# Patient Record
Sex: Male | Born: 1960 | Hispanic: No | Smoking: Never smoker
Health system: Southern US, Community
[De-identification: ages and names within clinical notes are randomized; demographics above are authoritative.]

## PROBLEM LIST (undated history)

## (undated) DIAGNOSIS — F99 Mental disorder, not otherwise specified: Secondary | ICD-10-CM

## (undated) DIAGNOSIS — F319 Bipolar disorder, unspecified: Secondary | ICD-10-CM

## (undated) DIAGNOSIS — E871 Hypo-osmolality and hyponatremia: Secondary | ICD-10-CM

## (undated) DIAGNOSIS — I639 Cerebral infarction, unspecified: Secondary | ICD-10-CM

## (undated) DIAGNOSIS — I10 Essential (primary) hypertension: Secondary | ICD-10-CM

## (undated) DIAGNOSIS — G4730 Sleep apnea, unspecified: Secondary | ICD-10-CM

## (undated) DIAGNOSIS — J302 Other seasonal allergic rhinitis: Secondary | ICD-10-CM

## (undated) HISTORY — PX: EYE SURGERY: SHX253

## (undated) HISTORY — PX: CIRCUMCISION: SUR203

---

## 2002-08-26 ENCOUNTER — Emergency Department (HOSPITAL_COMMUNITY): Admission: EM | Admit: 2002-08-26 | Discharge: 2002-08-26 | Payer: Self-pay

## 2004-05-06 ENCOUNTER — Inpatient Hospital Stay (HOSPITAL_COMMUNITY): Admission: AD | Admit: 2004-05-06 | Discharge: 2004-05-14 | Payer: Self-pay | Admitting: Psychiatry

## 2004-05-06 ENCOUNTER — Ambulatory Visit: Payer: Self-pay | Admitting: Psychiatry

## 2004-09-11 ENCOUNTER — Inpatient Hospital Stay (HOSPITAL_COMMUNITY): Admission: AD | Admit: 2004-09-11 | Discharge: 2004-09-16 | Payer: Self-pay | Admitting: Psychiatry

## 2004-09-11 ENCOUNTER — Ambulatory Visit: Payer: Self-pay | Admitting: Psychiatry

## 2005-01-10 ENCOUNTER — Emergency Department (HOSPITAL_COMMUNITY): Admission: EM | Admit: 2005-01-10 | Discharge: 2005-01-10 | Payer: Self-pay | Admitting: Emergency Medicine

## 2005-04-08 ENCOUNTER — Ambulatory Visit: Payer: Self-pay | Admitting: Family Medicine

## 2005-05-11 ENCOUNTER — Ambulatory Visit: Payer: Self-pay | Admitting: Family Medicine

## 2005-10-03 ENCOUNTER — Emergency Department (HOSPITAL_COMMUNITY): Admission: EM | Admit: 2005-10-03 | Discharge: 2005-10-03 | Payer: Self-pay | Admitting: *Deleted

## 2006-02-23 ENCOUNTER — Inpatient Hospital Stay (HOSPITAL_COMMUNITY): Admission: RE | Admit: 2006-02-23 | Discharge: 2006-02-27 | Payer: Self-pay | Admitting: Psychiatry

## 2006-02-23 ENCOUNTER — Ambulatory Visit: Payer: Self-pay | Admitting: Psychiatry

## 2006-06-11 ENCOUNTER — Ambulatory Visit: Payer: Self-pay | Admitting: Psychiatry

## 2006-06-11 ENCOUNTER — Inpatient Hospital Stay (HOSPITAL_COMMUNITY): Admission: AD | Admit: 2006-06-11 | Discharge: 2006-06-15 | Payer: Self-pay | Admitting: Psychiatry

## 2006-07-15 ENCOUNTER — Inpatient Hospital Stay (HOSPITAL_COMMUNITY): Admission: EM | Admit: 2006-07-15 | Discharge: 2006-07-19 | Payer: Self-pay | Admitting: *Deleted

## 2006-07-15 ENCOUNTER — Ambulatory Visit: Payer: Self-pay | Admitting: *Deleted

## 2007-11-16 ENCOUNTER — Emergency Department (HOSPITAL_COMMUNITY): Admission: EM | Admit: 2007-11-16 | Discharge: 2007-11-16 | Payer: Self-pay | Admitting: Emergency Medicine

## 2007-11-17 ENCOUNTER — Emergency Department (HOSPITAL_COMMUNITY): Admission: EM | Admit: 2007-11-17 | Discharge: 2007-11-17 | Payer: Self-pay | Admitting: Emergency Medicine

## 2009-04-02 ENCOUNTER — Emergency Department (HOSPITAL_COMMUNITY): Admission: EM | Admit: 2009-04-02 | Discharge: 2009-04-02 | Payer: Self-pay | Admitting: Emergency Medicine

## 2010-04-29 ENCOUNTER — Other Ambulatory Visit (HOSPITAL_COMMUNITY): Payer: Self-pay | Admitting: Emergency Medicine

## 2010-04-29 ENCOUNTER — Ambulatory Visit (HOSPITAL_COMMUNITY)
Admission: RE | Admit: 2010-04-29 | Discharge: 2010-04-29 | Disposition: A | Discharge status: Home / Self Care | Payer: Medicare Other | Source: Ambulatory Visit | Attending: Emergency Medicine | Admitting: Emergency Medicine

## 2010-04-29 ENCOUNTER — Observation Stay (HOSPITAL_COMMUNITY)
Admission: EM | Admit: 2010-04-29 | Discharge: 2010-04-30 | Disposition: A | Discharge status: Home / Self Care | Payer: Medicare Other | Attending: Internal Medicine | Admitting: Internal Medicine

## 2010-04-29 DIAGNOSIS — R55 Syncope and collapse: Secondary | ICD-10-CM

## 2010-04-29 DIAGNOSIS — I10 Essential (primary) hypertension: Secondary | ICD-10-CM | POA: Insufficient documentation

## 2010-04-29 DIAGNOSIS — F319 Bipolar disorder, unspecified: Secondary | ICD-10-CM | POA: Insufficient documentation

## 2010-04-29 DIAGNOSIS — R079 Chest pain, unspecified: Secondary | ICD-10-CM | POA: Insufficient documentation

## 2010-04-29 DIAGNOSIS — F259 Schizoaffective disorder, unspecified: Secondary | ICD-10-CM | POA: Insufficient documentation

## 2010-04-29 DIAGNOSIS — E119 Type 2 diabetes mellitus without complications: Secondary | ICD-10-CM | POA: Insufficient documentation

## 2010-04-29 LAB — CARDIAC PANEL(CRET KIN+CKTOT+MB+TROPI)
CK, MB: 3.7 ng/mL (ref 0.3–4.0)
Relative Index: 1.1 (ref 0.0–2.5)
Total CK: 295 U/L — ABNORMAL HIGH (ref 7–232)
Troponin I: <0.01 ng/mL (ref 0.00–0.06)
Troponin I: <0.01 ng/mL (ref 0.00–0.06)

## 2010-04-29 LAB — PHOSPHORUS: Phosphorus: 3.4 mg/dL (ref 2.3–4.6)

## 2010-04-29 LAB — RAPID URINE DRUG SCREEN, HOSP PERFORMED
Amphetamines: NOT DETECTED
Benzodiazepines: NOT DETECTED
Opiates: NOT DETECTED
Tetrahydrocannabinol: NOT DETECTED

## 2010-04-29 LAB — GLUCOSE, CAPILLARY: Glucose-Capillary: 134 mg/dL — ABNORMAL HIGH (ref 70–99)

## 2010-04-29 LAB — URINALYSIS, ROUTINE W REFLEX MICROSCOPIC
Bilirubin Urine: NEGATIVE
Hgb urine dipstick: NEGATIVE
Urine Glucose, Fasting: NEGATIVE mg/dL
Urobilinogen, UA: 0.2 mg/dL (ref 0.0–1.0)

## 2010-04-29 LAB — CBC
Hemoglobin: 14.4 g/dL (ref 13.0–17.0)
MCV: 86.1 fL (ref 78.0–100.0)
Platelets: 239 10*3/uL (ref 150–400)
RBC: 4.61 MIL/uL (ref 4.22–5.81)
RDW: 13 % (ref 11.5–15.5)

## 2010-04-29 LAB — BASIC METABOLIC PANEL
CO2: 25 mEq/L (ref 19–32)
Chloride: 100 mEq/L (ref 96–112)
Potassium: 4.9 mEq/L (ref 3.5–5.1)

## 2010-04-29 LAB — ETHANOL: Alcohol, Ethyl (B): <5 mg/dL (ref 0–10)

## 2010-04-29 LAB — CREATININE, URINE, RANDOM: Creatinine, Urine: 31.1 mg/dL

## 2010-04-30 DIAGNOSIS — R55 Syncope and collapse: Secondary | ICD-10-CM

## 2010-04-30 LAB — GLUCOSE, CAPILLARY: Glucose-Capillary: 108 mg/dL — ABNORMAL HIGH (ref 70–99)

## 2010-04-30 LAB — BASIC METABOLIC PANEL: Sodium: 136 mEq/L (ref 135–145)

## 2010-05-02 NOTE — Discharge Summary (Signed)
NAME:  Mitchell [name], Mitchell [name]                ACCOUNT NO.:  [id number]  MEDICAL RECORD NO.:  [id number]           PATIENT TYPE:  I  LOCATION:  3735                         FACILITY:  [facility]  PHYSICIAN:  Charlestine Massed, MDDATE OF BIRTH:  [birth year]  DATE OF ADMISSION:  04/29/2010 DATE OF DISCHARGE:  04/30/2010                        DISCHARGE SUMMARY - REFERRING   PRIMARY CARE PHYSICIAN:  The patient is expected to seen by the MD at the [facility].  CARDIOLOGY:  Rollene Rotunda, MD, [facility]  CHIEF COMPLAINT:  Passing out twice in the store.  DISCHARGE DIAGNOSES: 1. Syncope x2.  No evidence of any arrhythmias - for outpatient     followup with Cardiology. 2. No evidence of ischemic heart disease and no evidence of acute     coronary syndrome. 3. Orthostatic hypotension as a possible reason for his issues. 4. History of diabetes mellitus but blood sugars were pretty stable     during hospital stay without any medications - needs outpatient     followup for that. 5. History of hypertension, blood pressure is pretty stable without     any medications.  He will need further outpatient followup for     starting medications. 6. History of bipolar disorder/schizoaffective disorder.  DISCHARGE MEDICATIONS:  Aspirin 81 mg p.o. daily.  Continue home medications as follows: 1. Celexa 20 mg p.o. daily. 2. Cogentin 1 mg tablets 1-2 tablets p.o. b.i.d. 3. Prozac 20 mg p.o. daily.  HOSPITAL COURSE:  The patient was admitted because he syncopized  twice at 1:00 a.m. in the morning.  The patient has been otherwise okay.  He walked 15 minutes to the store before he syncopized.  He was admitted with a diagnosis of syncope and was placed on telemetry, underwent more than 36 hours of telemetry monitoring and so was not able to detect any arrhythmias.  It was noted that at the time of admission, he did have a low blood pressure on standing as measured by the EMS, which was not reproduced any time in  the ER here or on the floor.  Orthostatics remaining stable.  1. This patient has been seen by Cardiology, and they will follow up     in the outpatient setting.  He had an echocardiogram, which did not     reveal any stenotic lesions and he did not reveal any heart failure     or wall motion abnormalities.  His carotid and vertebral artery     Dopplers were also negative.  Currently, I have discharged with     single dose of aspirin. 2. History of diabetes.  The patient said that even though some people     told that he has diabetes, he never had any medications on and he     has been doing well all this time.  During his hospital stay, his     blood sugars have been constantly checked and they all remained     within normal and acceptable limits.  At this time, I am not     starting him on any medications since he has an excessive amount of  stress, so it can be started as an outpatient after checking his     hemoglobin A1c levels as well as 2 fasting blood sugar levels to     ascertain whether he has diabetes and then medication can be added. 3. Hypertension.  As mentioned above, he does have a history of     hypertension as he says but the blood pressures have stayed pretty     stable during the hospital stay despite the stress of being in the     hospital.  I am not initiating him on any antihypertensives at this     time and Cardiology has also seen and they did not support that     view too.  If necessary, medications have been started and as     outpatient when he is more stable and still if his blood pressure     remained more than 130.  At this time, he is discharged not on any     antihypertensive medication. 4. Bipolar disorder/schizoaffective disorder.  The patient follows     over the [facility].  He will follow up there     for his psychiatric issues.  DISPOSITION:  Discharged back home.  FOLLOWUP: 1. Follow up with the [facility] in 1 week.   Appointment has been     made already. 2. Follow up with Dr. Antoine Poche for outpatient Cardiology followup.  As     stated above, Cardiology will call for appointment.  SUBJECTIVE:  The patient is seen and examined today, not in any distress, afebrile.  OBJECTIVE:  VITAL SIGNS:  Temperature 98.4, heart rate 78, respirations 22, blood pressure 128/90, O2 saturation 99%. GENERAL:  The patient awake and alert. HEAD AND NECK:  No JVD, no bruits, no nodes. CHEST:  Bilateral air entry is good anteriorly and posteriorly.  No rales or wheeze heard. CARDIAC:  S1 and S2, regular.  No murmurs. ABDOMEN:  Soft, nontender, no organomegaly. EXTREMITIES:  No pedal edema.  LABORATORY DATA:  BMP:  Sodium 136, potassium 4.2, chloride 103, bicarb 25, glucose 100, BUN 6, creatinine 1.19, calcium 8.8.  An echocardiogram shows EF of 50-55%.  No wall motion abnormalities.  No evidence of mitral stenosis or regurgitation.  No evidence of aortic stenosis or aortic regurgitation.  Carotid Dopplers:  No internal carotid artery or external carotid artery stenosis and vertebral artery flow is antegrade.  ASSESSMENT/PLAN:  As dictated above.  A total of 40 minutes spent on the discharge process.     Charlestine Massed, MD    UT/MEDQ  D:  04/30/2010  T:  04/30/2010  Job:  [id number]  cc:   Rollene Rotunda, MD, [facility]  Electronically Signed by Charlestine Massed MD on 05/02/2010 11:21:23 AM

## 2010-05-02 NOTE — H&P (Signed)
NAME:  Mitchell [name], Mitchell [name]                ACCOUNT NO.:  [id number]  MEDICAL RECORD NO.:  [id number]           PATIENT TYPE:  E  LOCATION:  [facility]                         FACILITY:  [facility]  PHYSICIAN:  Charlestine Massed, MDDATE OF BIRTH:  1960/07/21  DATE OF ADMISSION:  04/29/2010 DATE OF DISCHARGE:                             HISTORY & PHYSICAL   PRIMARY CARE PHYSICIAN:  Unassigned.  The patient does not have a primary care physician.  Psychiatry follows up at [facility].  CHIEF COMPLAINT:  Passing out in the store twice.  HISTORY OF PRESENT ILLNESS:  [name] is a 50 year old gentleman with prior history of diabetes and hypertension for which he is not taking any medications, goes to the local store which is almost a 15- minute walk from his house in the morning at 1 o'clock.  He said he walked from his home to the store to take some money out of the ATM at 1:00 a.m. in the morning and when he reached the ATM, he felt lightheaded in the sense that is of darkness coming into his eyes.  He also said that he did feel palpitations in his chest and then he passed out.  He did not know what happened then and he woke up he was lying on the floor with people were looking over him and they helped him get up and they gave his card back.  When he got up, he again felt dizzy and passed out again.  At this time, the people called EMS, EMS picked him up.  As per the EMS note, his systolic blood pressure was 68 standing while 108 sitting by the EMS but in the ED.  He was brought to the ED. In the ED, he was checked and they had to redo his orthostatic vitals, which were normal here.  As per the witnesses, there was no evidence of any seizure activity as per EMS notes.  He did not have any defecation on urination on his clothes there.  He is still wearing the same cloth and is not wet.  No history of any injury to the head.  The patient denies any chest pain.  No nausea,  vomiting and diarrhea. No fever.  No chills.  No cough.  No sputum production.  He did say that when he felt dizzy, he also felt that his legs are too weak and giving away.  PAST HISTORY: 1. Schizoaffective disorder. 2. Hypertension. 3. Diabetes mellitus. 4. Poor compliance with doctor appointments.  CURRENT MEDICATIONS:  As per the patient; he is taking Prozac, Celexa, and Cogentin, not aware of any other medications.  He did say that he is not taking any medications for diabetes or hypertension and he says is controlled.  SOCIAL HISTORY:  No smoking, no alcohol, no drugs.  He lives by himself.  FAMILY HISTORY:  The patient could not recollect much of his family history.  REVIEW OF SYSTEMS:  Twelve-point system review unremarkable.  Positive pertinent, please see above in history of present illness, negative otherwise.  PHYSICAL EXAMINATION:  VITAL SIGNS:  In the ED, blood pressure 115/92, heart  rate 79, respiration 18, temperature 98.1. To note that the EMS vital signs are Systolic blood pressure 108 sitting and 68 on standing. GENERAL:  The patient is awake, alert, speaks clearly but he has a slightly rapid speech, but content of speech is clear.  Afebrile.  Not in any distress. HEAD AND NECK:  Pupils 3 mm reactive to light.  No JVD.  No bruits.  No nodes.  No bleeding in oral or  nasal mucosa.  Tongue is moist. CHEST:  Bilateral air entry is good anteriorly and posteriorly.  No rales or wheeze heard. CARDIAC:  S1 and S2 heard, regular, no murmurs. ABDOMEN:  Soft, nontender.  No organomegaly.  Bowel sounds positive. EXTREMITIES:  No pedal edema. CNS:  No obvious motor or sensory deficits.  Speech and comprehension intact. PSYCHIATRIC:  Slightly rapid speech, but not agitated, is in full control of his mental status.  He does not have any day hallucinations or delusions and speaks clearly and consider speech is clear.  CT scan of the head was reported by the radiologist  to the ED physician as negative for any acute intracranial issues.  EKG normal sinus rhythm at 78 beats a minute.  QTC is approximately for 430 milliseconds.  There is small R wave and deep S waves in V1 and V2 could be related to the axis itself.  There are no ST or T-wave changes of ischemia present at this time.  Lab works are still not available since the computer system supporting lab works are not up.  The only labs available are troponin-I 0.00, total CPK 420, CK-MB negative.  CBC:  WBC 3.26, hemoglobin 14.5, hematocrit 39.7, and platelets 239, MCV 86.1.  UA:  Negative for blood, negative for urinary tract infection.  BMP:  Sodium 134, potassium 4.1, chloride 96, bicarb 26, glucose 130, BUN 2, creatinine is 1.28, and calcium 9.6.  Blood alcohol level less than 5.0.  ASSESSMENT AND PLAN: 1. Recurrent syncope.  The causes could be:     a.     Orthostatic hypotension.     b.     Cardiac arrhythmias since the patient did have palpitations.     c.     Rule out vasovagal issues. 2. There is history of diabetes mellitus. 3. History of hypertension. 4. Schizoaffective disorder.  PLAN: 1. We will admit the patient to telemetry service under observation to     [facility] Team for [facility]. 2. Place him on telemetry and observe his cardiac rhythm correctly. 3. We will check magnesium and phosphorus on the a.m.  Blood sample     done already.  We will check troponins. 4. With regards to his creatinine being 1.28, I will follow.  I will     put him on some gentle IV fluids 100 mL/h, which could eliminate     any other dehydration.  We will check orthostatics on admit and     orthostatic in the followup and if the telemetry does not reveal     and he will need possibly outpatient workup for ruling out     arrhythmias.  I already informed [facility] to follow up on     that.  We will get echocardiogram and carotid Dopplers. 5. Deep venous thrombosis.  We will put him on  Lovenox.  We will     continue aspirin.  A total of 60 minutes spent on the admission process.     Charlestine Massed, MD  UT/MEDQ  D:  04/29/2010  T:  04/29/2010  Job:  [id number]  Electronically Signed by Charlestine Massed MD on 05/02/2010 11:21:13 AM

## 2010-05-12 DIAGNOSIS — I10 Essential (primary) hypertension: Secondary | ICD-10-CM | POA: Insufficient documentation

## 2010-05-12 DIAGNOSIS — E1165 Type 2 diabetes mellitus with hyperglycemia: Secondary | ICD-10-CM | POA: Insufficient documentation

## 2010-05-12 DIAGNOSIS — I951 Orthostatic hypotension: Secondary | ICD-10-CM | POA: Insufficient documentation

## 2010-05-12 DIAGNOSIS — R55 Syncope and collapse: Secondary | ICD-10-CM | POA: Insufficient documentation

## 2010-05-12 DIAGNOSIS — F259 Schizoaffective disorder, unspecified: Secondary | ICD-10-CM | POA: Insufficient documentation

## 2010-05-12 DIAGNOSIS — I959 Hypotension, unspecified: Secondary | ICD-10-CM | POA: Insufficient documentation

## 2010-05-12 DIAGNOSIS — F319 Bipolar disorder, unspecified: Secondary | ICD-10-CM | POA: Insufficient documentation

## 2010-05-13 ENCOUNTER — Telehealth (INDEPENDENT_AMBULATORY_CARE_PROVIDER_SITE_OTHER): Payer: Self-pay | Admitting: Radiology

## 2010-05-14 ENCOUNTER — Encounter (HOSPITAL_COMMUNITY): Payer: Medicare Other

## 2010-05-18 ENCOUNTER — Encounter: Payer: Medicare Other | Admitting: Physician Assistant

## 2010-05-20 NOTE — Progress Notes (Signed)
Summary: Nuclear Pre-Procedure  Phone Note Outgoing Call Call back at [facility] Phone [phone]   Call placed by: Stanton Kidney, EMT-P,  May 13, 2010 2:13 PM Call placed to: Patient Reason for Call: Confirm/change Appt Summary of Call: Unable to leave message with information on [employer] Information Sheet (see scanned document for details). No one at that number has this patients name. Stanton Kidney, EMT-P  May 13, 2010 2:13 PM      Nuclear Med Background Indications for Stress Test: Evaluation for Ischemia, Post Hospital  Indications Comments: 04/29/10 syncopex2 - arrhythmia  History: Echo  History Comments: 04/30/10 Echo-EF=50-55%  Symptoms: Syncope    Nuclear Pre-Procedure Cardiac Risk Factors: Hypertension

## 2010-06-11 NOTE — Consult Note (Signed)
NAME:  Mitchell [name], Mitchell [name] NO.:  [id number]  MEDICAL RECORD NO.:  [id number]           PATIENT TYPE:  LOCATION:                                 FACILITY:  PHYSICIAN:  Rollene Rotunda, MD, FACCDATE OF BIRTH:  Mar 17, 1961  DATE OF CONSULTATION: DATE OF DISCHARGE:                                CONSULTATION   This is a consult note for chief complaint of syncope x2 prior to admission.  PRIMARY CARE PHYSICIAN:  The patient does not have a PCP.  PRIMARY PSYCHIATRIST:  The patient follows at [facility].  HISTORY OF PRESENT ILLNESS:  Mitchell [name] is a 50 year old gentleman with past medical history of type 1 diabetes mellitus, hypertension, and schizo-affective disorder being followed at the [facility] who was brought by EMS to the [facility] ER last night at 1 a.m. for chief complaint of syncope x2.  The patient was standing at an ATM machine for 5-10 minutes and 5-10 minutes before the event he felt dizzy with feeling of palpitations and darkness in his vision prior to passing out.  He did not have any chest pain or shortness of breath before this event.  When he woke up, he was not confused and did not have any tongue biting or urinary or fecal incontinence.  He was told by the bystander that he had passed out for about 15 minutes and did not seize during this episode.  After this event, he tried to stand up and was trying to get money out of the ATM machine again and he had a similar episode that lasted about 10 minutes.  EMS was called from the site and he was brought to the emergency room.  On route to the emergency room, the EMS checked his orthostatic vitals and he was found to be orthostatic with systolic blood pressure falling from 108 to 168 from sitting to standing up position.  The patient denies any similar symptoms before yesterday. He says that he had been eating 1 full meal a day and had been keeping himself hydrated  throughout the day before this event.  He denies any diarrhea, vomiting, flu-like symptoms, or any other symptoms prior to the event.  He denies any chest pain, shortness of breath, change in medications, fever, headache, or any other complaints in the last 1-2 weeks prior to this onset of this symptoms.  He was diagnosed with hypertension 20 years ago, but he has never been on any anti- hypertensive medications, has never been followed up by primary care physician for his hypertension and diabetes.  He denies any focal weakness.  He has been hydrated aggressively in the emergency and he feels fine.  He denies any dizziness, nausea, lightheadedness, or any other complaints at this time.  PAST MEDICAL HISTORY: 1. Type 1 diabetes mellitus, currently on no antidiabetic medications. 2. Hypertension, currently not on any medications. 3. Bipolar schizo-affective disorder.  MEDICATIONS:  Home Medications: 1. Cogentin 1 mg tablet twice a day and 2 mg at bedtime. 2. Prozac 20 mg daily. 3. Celexa, dose not known.  ALLERGIES:  The patient does not  have any medication allergies known at this time.  SOCIAL HISTORY:  The patient denies any drug, alcohol, or tobacco abuse. He lives with his friend in [place].  He gets Haematologist, he has Medicaid and [employer].  He is retired, used to work as a Electrical engineer prior to that.  FAMILY HISTORY:  The patient had a brother who had heart failure and pacemaker placement; and as per the patient, he died of myocardial infarction.  There is no other cardiac family history that the patient knows about.  The patient denies any other medical problems in the family.  PHYSICAL EXAMINATION:  VITAL SIGNS:  Temperature 98.1, pulse 107 per minute, blood pressure 118/86, respiratory rate 18 per minute, oxygen saturation 99% on room air.  The patient is currently not orthostatic in the emergency room. GENERAL:  The patient is  lying in bed in no acute distress. HEENT:  Pupils equal, reactive to light.  Extraocular muscles intact. Mucosa moist and pink. NECK:  Supple.  No JVD. CVS:  Regular rate and rhythm.  No murmurs.  No abnormal heart sounds. RESPIRATIONS:  Clear to auscultation bilaterally.  Equal breath sounds bilaterally. ABDOMEN:  Soft, nontender, and nondistended.  Normal bowel sounds. EXTREMITIES:  No edema.  Normal pulsations in all four extremities.  No enlarged veins. NEUROLOGICAL:  The patient is alert and oriented x3.  Motor strength good in all 4 extremities.  Sensations normal in all 4 extremities. Reflexes 2+ bilaterally.  Gait normal.  The patient does not have any dizziness or lightheadedness on standing up from supine position at this time.  LABORATORY DATA:  Sodium 134, potassium 4.1, chloride 96, bicarb 26, glucose 130, BUN 2, creatinine 1.28, calcium 9.6.  Blood alcohol level less than 5.  White count 3.26, RBC 4.61, hemoglobin 14.4, hematocrit 39.7, MCV 86.1, platelet count 239.  Point-of-care cardiac marker shows a CK of 420, CK-MB of 5.2, and no troponin elevations.  EKG shows normal sinus rhythm with a rate of 78 per minute, no ST-T wave changes.  IMAGING:  His noncontrast CT head and chest x-ray are normal.  ASSESSMENT AND PLAN:  This is a 50 year old gentleman with no known cardiac history and longstanding type 1 diabetes mellitus and hypertension who comes into the ER with 2 episodes of syncope 5 minutes apart after prolonged standing at an ATM machine.  He had a prodrome of palpitations, feeling of warmth, and blurry vision prior to the episode. This is his first episode.  He did not have any prior episode before since.  His lab work, EKG, and chest x-ray did not reveal the cause of his syncope, but he was significantly orthostatic in the ambulance. Prior to arrival to the emergency room, he has been aggressively hydrated by now and he does not have any symptoms at this  time.  He probably was dehydrated from inadequate fluid intake prior to this episode given his significant orthostatic hypotension.  This does explain the cause of his syncope, although given his history of diabetes and hypertension, he needs a transthoracic echocardiogram and carotid Dopplers bilaterally to rule out any obstruction.  He will be monitored in telemetry unit overnight and he will be given IV fluid hydration during this period.  His orthostatic blood pressure would be checked when he arrives at the floor and tomorrow morning prior to discharge. He will need an exercise treadmill test as an outpatient to rule out any coronary obstruction.  If he continues to be orthostatic after  IV fluid hydration in the hospital, he will need supportive care as far as volume expanders like midodrine or fludrocortisone compression stockings.  He has also been advised to watch for further episodes of lightheadedness after prolonged standing and to stand up slowly from a seated position and to seek immediate help in case he experiences further episode of lightheadedness in the future. [facility] will be admitting the patient.  We will follow the patient along with you.  Thank you for this consultation.     Bethel Born, MD   ______________________________ Rollene Rotunda, MD, [facility]    MD/MEDQ  D:  04/29/2010  T:  04/30/2010  Job:  [id number]  Electronically Signed by Bethel Born  on 06/07/2010 07:06:29 PM Electronically Signed by Rollene Rotunda MD [facility] on 06/11/2010 07:57:12 PM

## 2010-06-14 LAB — BASIC METABOLIC PANEL
CO2: 30 mEq/L (ref 19–32)
Chloride: 94 mEq/L — ABNORMAL LOW (ref 96–112)
Creatinine, Ser: 0.85 mg/dL (ref 0.4–1.5)
GFR calc non Af Amer: >60 mL/min (ref >60)
Sodium: 131 mEq/L — ABNORMAL LOW (ref 135–145)

## 2010-06-14 LAB — CBC
MCHC: 35.2 g/dL (ref 30.0–36.0)
MCV: 92.6 fL (ref 78.0–100.0)
Platelets: 270 10*3/uL (ref 150–400)
WBC: 4.7 10*3/uL (ref 4.0–10.5)

## 2010-06-14 LAB — DIFFERENTIAL
Basophils Absolute: 0 10*3/uL (ref 0.0–0.1)
Monocytes Absolute: 0.4 10*3/uL (ref 0.1–1.0)
Neutrophils Relative %: 52 % (ref 43–77)

## 2010-08-14 NOTE — Discharge Summary (Signed)
NAME:  CARLE, FENECH NO.:  [id number]   MEDICAL RECORD NO.:  [id number]          PATIENT TYPE:  IPS   LOCATION:  0305                          FACILITY:  [facility]   PHYSICIAN:  Anselm Jungling, MD  DATE OF BIRTH:  April 25, 1960   DATE OF ADMISSION:  02/23/2006  DATE OF DISCHARGE:  02/27/2006                               DISCHARGE SUMMARY   IDENTIFYING DATA AND REASON FOR ADMISSION:  This was a psychiatric  inpatient admission for Mitchell [name], a 50 year old separated African American  male admitted due to increasing suicidal ideation.  He stated, I was on  the verge of hurting myself because I was depressed, lonely, hungry, and  very suicidal.  He had recently separated from his wife of only 10  months.  He was also having financial problems, though he was working at  a [employer].  Paperwork indicated that the patient had lost  his job due to various paranoid influence behaviors at work, and he  apparently also had a previous history of mania and paranoia.  He had  been on a regimen of Prolixin Decanoate and Cogentin.  He reported that  he had been adherent with his medication regimen, but this was in some  doubt.  Please refer to the admission note for further details  pertaining to the symptoms, circumstances and history that led to his  hospitalization.  He was given an initial Axis I diagnosis of  schizophrenia, chronic paranoid, with acute exacerbation.   MEDICAL AND LABORATORY:  The patient was medically and physically  assessed by the psychiatric nurse practitioner.  He had a history of  diabetes, and this was followed by the nurse practitioner throughout his  stay.  At the time of discharge, he was instructed to continue with his  physicians usual instructions regarding diabetic management and care.   HOSPITAL COURSE:  The patient was admitted to the adult inpatient  psychiatric service.  He presented as a well-nourished, well-developed  male who was  polite, cooperative, and fully oriented.  He admitted to  auditory hallucinations and paranoid thoughts.  He made no overtly  delusional statements.  He reported that the voices were telling him to  suicide.  He denied any wish to die, however, and verbalized a strong  desire for help follow-up with all of this.   The patient was continued on Prolixin, and given 5 mg orally three times  daily, with Cogentin 2 mg daily.  It was felt best to continue his  Prolixin Decanoate injection of 12.5 mg IM, q.2 weeks.  His last  injection had been 7 days prior to admission, and his next injection was  due 3 days after his discharge, on 03/02/2006.   The patient did well in the inpatient program and attended various  therapeutic groups and activities.  He was pleasant and cooperative with  peers and staff alike.  By the fifth hospital day, the patient appeared  to be quite appropriate for discharge.  He denied any further auditory  hallucinations, and denied any thoughts or voices regarding self-harm or  suicide.  He was pleasant and appropriate.  He agreed to the following  aftercare plan.   AFTERCARE:  The patient was to follow-up with an appointment at the  [facility] on 03/03/2006.  He was to have another Prolixin  Decanoate injection of 12.5 mg IM on 03/02/2006 at the [facility].  He was to continue Prolixin 5 mg orally three times daily in the  meantime.  He was to continue Cogentin 2 mg q.a.m.  He was instructed to  continue with diabetic care as directed by his usual physician.   DISCHARGE DIAGNOSES:  AXIS I:  Schizophrenia, chronic paranoid, acute  exacerbation, resolving.  AXIS II:  Deferred.  AXIS III:  Diabetes mellitus.  AXIS IV:  Stressors severe.  AXIS V:  GAF on discharge 65.      Anselm Jungling, MD  Electronically Signed     SPB/MEDQ  D:  04/01/2006  T:  04/02/2006  Job:  [id number]

## 2010-08-14 NOTE — Discharge Summary (Signed)
NAME:  AUGUSTA, HILBERT NO.:  [id number]   MEDICAL RECORD NO.:  [id number]          PATIENT TYPE:  IPS   LOCATION:  0400                          FACILITY:  BH   PHYSICIAN:  Jasmine Pang, M.D. DATE OF BIRTH:  10-25-1960   DATE OF ADMISSION:  06/11/2006  DATE OF DISCHARGE:  06/15/2006                               DISCHARGE SUMMARY   IDENTIFICATION:  This is a 50 year old separated African American male  who was admitted on a voluntary basis on June 11, 2006.   HISTORY OF PRESENT ILLNESS:  Apparently the patient presented to  [facility] mental health on the day of admission.  He stated that  he has been estranged from his wife for the past 3-4 months after 1 year  of marriage.  He states he is receiving mixed messages about  reconciliation.  He states I cannot go on.  Apparently he dumped his  Cogentin in the toilet at the [facility] health center,  although he did apologize for discarding the medication.  He stated that  he could not contract to be safe in the safe in the community.  He  states his feelings are hurt, as his wife is due to receive her tax  refund tomorrow and plans to let her mother and other family member  spend it, rather than him.  Mr. Degrazia was last with [place] from November 28  to February 27, 2006.  He has been followed as an outpatient at [facility] mental health center for about 20 years.  He has had numerous  admissions to [facility], as well as here.   ALCOHOL AND DRUG HISTORY:  None.   PRIMARY CARE PHYSICIAN:  He denies that he has a primary care physician.  He is followed at the [facility] by Dr. Hortencia Pilar.   PAST MEDICAL HISTORY:  He is known to have hypertension and non-insulin-  dependent diabetes mellitus, which is managed by diet.  He does not seem  to have any antihypertensive drugs prescribed for him.   MEDICATIONS:  The patient is on Prolixin Decanoate 25 mg every 2 weeks;  his last  dose was June 07, 2006 and next dose is to be June 21, 2006.  He is also on Cogentin 2 mg p.o. daily p.r.n. EPS.   ALLERGIES:  HE HAS NO KNOWN DRUG ALLERGIES.   PHYSICAL FINDINGS:  The patient was found to be a well-developed, well-  nourished African American male who appeared his stated age.  He was in  no acute physical distress.   ADMISSION LABORATORIES:  Comprehensive metabolic panel was within normal  limits.  CBC was within normal limits.  TSH was 1.752, which was within  normal limits.   HOSPITAL COURSE:  Upon admission the patient was kept on Cogentin 1 mg  p.o. b.i.d., Prolixin Decanoate 25 mg q. 2 weeks with last dose on June 07, 2006, Prolixin tablets 5 mg p.o. t.i.d., and Prozac 20 mg p.o.  daily.  He was also placed on Ativan 2 mg p.o. or IM q. 6  p.r.n.  agitation or anxiety and Haldol 5 mg p.o. or IM q. 6h p.r.n. agitation  or anxiety (these were not necessary, however).  On June 13, 2006 the  patient was started on Risperdal 2 mg p.o. q.h.s. and Cogentin 2 mg p.o.  q.h.s.  The patient tolerated his medications well with no significant  side effects.   Upon admission the patient admitted that he had suicidal ideation and  had come to the hospital to help deal with this.  He was hurt by his  separation from his wife and the fact that she was going to spend their  income tax money.  On June 13, 2006 the patient states I got upset at  my wife.  He was feeling depressed, tired and angry.  He had argued  with his wife's mother about taxes.  He wants to go home but he is aware  we needed to talk with his wife first.  Sleep was good and appetite was  good.  No suicidal or homicidal ideation.  No auditory or visual  hallucinations.  He talked about going to the [facility] for  treatment.  Risperdal 2 mg p.o. q.h.s. and Cogentin 2 mg p.o. q.h.s.  were started.  On June 14, 2006 the patient stated he was just fine.  We were contacting the patient's case manager to  let him know that he  would be discharged soon.  The patient was sleeping well.  Appetite was  good.  He denied depression.  He wanted to go home.  There was no  suicidal or homicidal ideation.  No auditory or visual hallucinations.   On June 15, 2006 mental status had improved markedly from admission  status.  The patient was friendly and cooperative with good eye contact.  Speech was normal in rate and flow.  Psychomotor activity was within  normal limits.  Mood was euthymic.  Affect was still somewhat  constricted but able to reveal a wide range.  There was no suicidal or  homicidal ideation.  No thoughts of self-injurious behavior.  No  auditory or visual hallucinations.  No paranoia or delusions.  Thoughts  were logical and goal-directed.  Thought content had no predominant  theme.  Cognitive was back to baseline, which was within normal limits.  It was felt the patient was stable to be discharged today; he was going  home to live by himself and would not be with his wife.   DISCHARGE DIAGNOSES:  AXIS I:  Schizoaffective disorder, bipolar type.   AXIS II:  No diagnosis.   AXIS III:  1. Hypertension with no medications.  2. Non-insulin-dependent diabetes mellitus which is diet-controlled.   AXIS IV:  Severe (problems with primary support group, economic problems, problems  with medical issues, and burden of psychiatric illness).   AXIS V:  GAF upon discharge was 50.  GAF upon admission was 35.  GAF highest past  year was 65.   DISCHARGE PLANS:  There were no specific activity level or dietary  restrictions.   POST HOSPITAL CARE PLANS:  The patient will see Dr. Lang Snow at the  [facility] on Tuesday, March 25 at 1:00 p.m.   DISCHARGE MEDICATIONS:  1. Cogentin 1 mg twice daily and 2 pills at bedtime.  2. Fluphenazine decanoate 25 mg injection every 14 days (next dose due      March 25,2008). 3. Prolixin tablets 5 mg p.o. t.i.d.  4. Prozac 20 mg daily.  5.  Risperdal 2 mg at bedtime.  6. Ambien 10 mg at bedtime as needed for sleep.      Jasmine Pang, M.D.  Electronically Signed     BHS/MEDQ  D:  06/15/2006  T:  06/15/2006  Job:  [id number]

## 2010-08-14 NOTE — H&P (Signed)
NAME:  Mitchell [name], Mitchell [name] NO.:  [id number]   MEDICAL RECORD NO.:  [id number]          PATIENT TYPE:  IPS   LOCATION:  0400                          FACILITY:  BH   PHYSICIAN:  Jasmine Pang, M.D. DATE OF BIRTH:  1960-10-03   DATE OF ADMISSION:  07/15/2006  DATE OF DISCHARGE:                       PSYCHIATRIC ADMISSION ASSESSMENT   IDENTIFYING INFORMATION:  This is a voluntary admission to the services  of Dr. Milford Cage.  This is a 50 year old separated African-American  male.  Apparently, he went to his wife's place of employment yesterday.  To prevent getting into an argument with her, he left.  He became very  stressed out, angry and upset after talking to his wife.  He called the  [facility] asking them to please send the police to pick him up.  He stated he had a gun and he was going to hurt himself.  Today, he  denies issues with sleep, appetite or auditory hallucinations.  He  states he just wants his wife to come home.  According to him, her  parents do not like him.  They are keeping them separated but he states  they have been to court and the judge said there is no reason for them  to divorce and the wife can come home should she want to.  Today, he is  agreeable to having his Prolixin Decanoate IM a little bit early.  He  last received it on the 10th.  He is due every 14 days.  He denies a  need for any other changes.   PAST PSYCHIATRIC HISTORY:  He was with [place] last month.  Similar  situation.  He was here June 11, 2006 to June 15, 2006.  He was also  with [place] November 28th to December 2nd.  He has had numerous other  admissions at [facility] and [facility].  He has been followed at  [facility] for 20 years.   SOCIAL HISTORY:  He finished the 12th grade.  He states he has been  married once to his current wife.  He states he has a 97 year old son by  another woman.  He is not employed.  He lives in an apartment and he  does receive disability.   ALCOHOL/DRUG HISTORY:  He denies.   FAMILY HISTORY:  His brother died of an myocardial infarction as did his  uncle.   MEDICAL PROBLEMS:  He gives a history for hypertension and elevated  blood sugar.  Indeed, his glucose is 130 but he denies any medications.  He is overweight.   MEDICATIONS:  He is currently prescribed Cogentin 1 mg p.o. b.i.d.,  Cogentin 2 mg at h.s., Prolixin Decanoate 25 mg IM every two weeks.  His  last does being July 07, 2006.  Prolixin tablets 5 mg p.o. t.i.d. and  Prozac 20 mg p.o. q.d. and Risperdal 2 mg at h.s.   ALLERGIES:  No known drug allergies.   POSITIVE PHYSICAL FINDINGS:  He has no acute physical findings.  He  denies issues in all review of systems.  His vital signs  on admission to  the unit show that he is 63.75 inches tall, he weighs 189 pounds, his  temperature is 97.3, blood pressure 142/85 to 139/83, pulse 67 and  respirations 16.   LABORATORY DATA:  His labs that are ready had no abnormal findings other  than a slightly elevated glucose at 130.   MENTAL STATUS EXAM:  Alert and oriented.  He is well-groomed, dressed  and appropriately nourished.  His speech is not pressured.  His mood is  depressed.  His affect is congruent.  His thought processes are clear,  concrete and goal-oriented.  He wants his wife to return home.  He is  somewhat delusional and paranoid.  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  Judgment and insight are poor.  Concentration and memory are good.  Intelligence is average.  He denies being suicidal or homicidal.  He  denies having auditory or visual hallucinations.   DIAGNOSES:  AXIS I:  Schizoaffective disorder with acute exacerbation.  AXIS II:  Deferred.  AXIS III:  Hypertension, diabetes mellitus.  AXIS IV:  Severe.  AXIS V:  30.   PLAN:  To admit for safety and stabilization.  We will go ahead and give  his Prolixin  Decanoate a few days early 25 mg IM.  We will have our  counselor set up a family session with his wife and we will get the  casemanager at [facility] to help with reconciliation  with his wife.  He will just be here for a few days.      Mickie Leonarda Salon, P.A.-C.      Jasmine Pang, M.D.  Electronically Signed    MD/MEDQ  D:  07/16/2006  T:  07/16/2006  Job:  [id number]

## 2010-08-14 NOTE — Discharge Summary (Signed)
NAME:  Mitchell [name], NED NO.:  [id number]   MEDICAL RECORD NO.:  [id number]          PATIENT TYPE:  IPS   LOCATION:  0403                          FACILITY:  BH   PHYSICIAN:  Geoffery Lyons, M.D.      DATE OF BIRTH:  1960/09/09   DATE OF ADMISSION:  05/06/2004  DATE OF DISCHARGE:  05/14/2004                                 DISCHARGE SUMMARY   CHIEF COMPLAINT AND PRESENT ILLNESS:  This was the first admission to [facility] for this 50 year old African-American male  involuntarily committed.  History of schizoaffective disorder, paranoid,  delusional, disorganized.  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  Claimed that they were believing he was stealing  things, wondering how he was communicating with his dead finance.  Went to  get a Clinical research associate, named Virgina Evener, to help him.  Not sure why he was admitted.   PAST PSYCHIATRIC HISTORY:  First time at [facility].  In [place] in  1994 with hearing voices.  Missed appointment at [facility] on January 23rd, and came late on February 8th out of  medications.  Has been hospitalized in [facility] in May of 2004.   ALCOHOL/DRUG HISTORY:  Denies the use or abuse of any substances.   PAST MEDICAL HISTORY:  Type 2 diabetes mellitus, arterial hypertension.   MEDICATIONS:  Lexapro 20 mg per day, Risperdal 2 mg at night.   PHYSICAL EXAMINATION:  Performed and failed to show any acute findings.   LABORATORY DATA:  CBC within normal limits.  Blood chemistries with glucose  132.  Liver enzymes within normal limits.  TSH 2.573.  Drug screen negative  for substances of abuse.   MENTAL STATUS EXAM:  Alert, cooperative male.  Fair eye contact.  Some  grandiosity.  Some pressured speech.  Mood feeling great.  Affect expansive.  Thought processes delusional, ideas of reference, some paranoia, some  grandiosity.  No active hallucinations.  Cognition was  well-preserved.   ADMISSION DIAGNOSES:   AXIS I:  Bipolar disorder with psychotic features.   AXIS II:  No diagnosis.   AXIS III:  1.  Arterial hypertension.  2.  Diabetes mellitus, type 2.   AXIS IV:  Moderate.   AXIS V:  Global Assessment of Functioning upon admission 25-30; highest  Global Assessment of Functioning in the last year 60.   HOSPITAL COURSE:  He was admitted and started in individual and group  psychotherapy.  He was given Ambien for sleep.  He was given Ativan as  needed and Risperdal 1 mg at night.  Risperdal was increased to 1 mg twice a  day.  We continued to work with the Risperdal.  It was increased to 1 mg in  the morning, 1.5 mg at night and he was started on Depakote 250 mg twice and  day and 500 mg at night.  Risperdal was eventually increased to 1 mg in the  morning and 2 mg at bedtime.  Upon admission, he evidenced delusional ideas,  grandiosity, ideas  of reference.  He got a restraining order against the  bishop as the Aileen Pilot was influencing the people.  Claimed that he was  sending girls bending in front of him to see if he was going to react.  He  had sent a homosexual to check if he would go for it.  Feels this pressure  if he is having a relationship with the deaf girl that belonged to the  church.  Anxious, agitated, suspicious.  As he continued to take the  medication, he started settling down but still continued to evidence the  pressured speech.  Still staying that the bishop was behind things.  Felt  that the girlfriend perceives things the same way.  We continued to work  with the Risperdal and the Depakote.  He was able to accept a challenge to  his perceptions.  He said that he was not going to pursue the case.  He was  supposed to count on the girlfriend's mother but she was not supportive but  the girlfriend was still going to be in a relationship with him.  On  February 16th, he was much better.  He said he was going to be good and   mind his own business.  He was going back to his house where girlfriend was  going to be waiting.  They were going to pursue the relationship.  They were  going to avoid going to this particular church.  Overall, he felt better.  There was no evidence of acute delusional ideations upon discharge.  No  suicidal or homicidal ideation.   DISCHARGE DIAGNOSES:   AXIS I:  Bipolar disorder with psychotic features.   AXIS II:  No diagnosis.   AXIS III:  1.  Arterial hypertension.  2.  Diabetes mellitus, type 2.   AXIS IV:  Moderate.   AXIS V:  Global Assessment of Functioning upon discharge 50.   DISCHARGE MEDICATIONS:  1.  Risperdal 1 mg, 1 in the morning and 2 at night.  2.  Depakote ER 250 mg, 1 in the morning and 2 at bedtime.   FOLLOW UP:  Dr. Lang Snow.      IL/MEDQ  D:  06/09/2004  T:  06/10/2004  Job:  [id number]

## 2010-08-14 NOTE — Discharge Summary (Signed)
NAME:  Mitchell [name], Mitchell [name] NO.:  [id number]   MEDICAL RECORD NO.:  [id number]          PATIENT TYPE:  IPS   LOCATION:  0400                          FACILITY:  BH   PHYSICIAN:  Jasmine Pang, M.D. DATE OF BIRTH:  05-02-1960   DATE OF ADMISSION:  07/15/2006  DATE OF DISCHARGE:  07/19/2006                               DISCHARGE SUMMARY   IDENTIFICATION:  This is a voluntary admission for this 50 year old  separated African-American male who was admitted on July 15, 2006.   HISTORY OF PRESENT ILLNESS:  The patient apparently went to his wife's  place of employment yesterday.  To prevent getting into an argument with  her he left.  He became very stressed out and angry and upset after  talking to his wife.  He called the [facility] asking them to  please send the police to pick him up.  He stated he had a gun and was  going to hurt himself.  He does state now he has thrown the gun away in  a river.  Today he denies issues with sleep, appetite or auditory  hallucinations.  He stated he just wants his wife to come home.  According to him, her parents do not like him.  They are keeping them  separated, but he states they have been to court and the judge said  there was no reason for them to divorce.  He says the wife can come home  if she wants to.  Today he is agreeable to having Prolixin Decanoate IM  a little bit early.  He last received it on the 10th.  He is due every  14 days.  He denies a need for any other changes.  He was with [place] last  month with a similar situation.  He was here June 11, 2006 to June 15, 2006.  He was also with [place] November 28 to December 2.  He has had  numerous other admissions at [facility].  His  brother died of an MI, as did his father.  He gives a history of  hypertension and elevated blood sugar.  He is overweight.  He is  currently on Cogentin 1 mg p.o. b.i.d. and 2 mg p.o. nightly, Prolixin  Decanoate  25 mg IM q.2 weeks, his last being July 07, 2006, Prolixin  tablets 5 mg p.o. t.i.d. and Prozac 20 mg p.o. q. day, and Risperdal 2  mg p.o. nightly.  He has no known drug allergies.   PHYSICAL FINDINGS:  The patient had no acute physical findings.  He  denies issues in all review of systems.   ADMISSION LABORATORIES:  CBC was within normal limits.  A comprehensive  metabolic panel was within normal limits except for an elevated glucose  of 130 and (70-99).   HOSPITAL COURSE:  Upon admission, patient was started on the Ambien 10  mg p.o. nightly p.r.n. sleep.  He was also restarted on Cogentin 1 mg  p.o. b.i.d. and 2 mg p.o. nightly, Prolixin Decanoate 825 mg IM every 2  weeks  with most recent dose being July 16, 2006, Prolixin tablets 5 mg  p.o. t.i.d., Prozac 20 mg p.o. daily, Risperdal 2 mg p.o. nightly.  On  July 16, 2006, the patient was given Prolixin Decanoate 25 mg IM today.  This was several days early, but he felt that it would be helpful with  his mental status.  The patient tolerated his medications well with no  significant side effects.   Upon first meeting patient, he discussed being very stressed out,  especially by his wife.  He has been going to the mental health center  since 1985.  He said I just want her and her friends to leave me  alone.  He states he went to [facility] and told them he was  suicidal.  On July 17, 2006, patient had slept well, his appetite was  good.  There was some anxiety.  He states his depression has resolved.  He was calmer about the conflict with his wife.  He says he is not sure  whether they will reunite or not.  He wants to know when he can go home.  On July 18, 2006, patient was anxious about going home.  He had no  suicidal or homicidal ideation.  No auditory or visual hallucinations.  He did state that he threw his gun away in a river and has no guns at  home.  He denied side effects from any of his medications.   On July 19, 2006, mental status had improved markedly from admission  status.  The patient was friendly, cooperative, conversant.  He had good  eye contact.  Speech was normal rate and flow.  Psychomotor activity was  within normal limits.  Mood was euthymic.  Affect wide range.  There was  no suicidal or homicidal ideation.  No thoughts of self-injurious  behavior.  No auditory or visual hallucinations.  No paranoia or  delusions.  Thoughts were logical and goal-directed.  Thought content no  predominant theme.  Cognitive grossly was back to baseline.  It was felt  patient was safe to be discharged back to his home today.   DISCHARGE DIAGNOSES:  AXIS I:  Schizoaffective disorder, depressed mood.  AXIS II:  None.  AXIS III:  Hypertension, diabetes mellitus.  AXIS IV:  Severe (problems with primary support group, burden of  psychiatric illness, burden of medical illness).  AXIS V:  Global assessment of functioning upon discharge was 50.  Global  assessment of functioning upon admission was 30.  Global assessment of  functioning highest past year was 65.   DISCHARGE PLANS:  There were no specific activity level or dietary  restrictions.   DISCHARGE MEDICATIONS:  1. Cogentin 1 mg p.o. b.i.d. and 2 mg at bedtime.  2. Prolixin 5 mg t.i.d.  3. Prozac 20 mg daily.  4. Risperdal 2 mg at bedtime.  5. Prolixin Decanoate 25 mg every 14 days, last dose on July 16, 2006.  6. Ambien 10 mg at bedtime if needed.   POST HOSPITAL CARE PLANS:  The patient will see Dr. Lang Snow on Tuesday,  July 26, 2006, at 10:30 a.m. at [facility].  He will also see  Lavinia Sharps  at [facility] on Thursday, April 24, at 10:30 a.m.      Jasmine Pang, M.D.  Electronically Signed     BHS/MEDQ  D:  07/19/2006  T:  07/19/2006  Job:  [id number]

## 2010-08-14 NOTE — H&P (Signed)
NAME:  Mitchell [name], Mitchell [name] NO.:  [id number]   MEDICAL RECORD NO.:  [id number]          PATIENT TYPE:  IPS   LOCATION:  0400                          FACILITY:  [facility]   PHYSICIAN:  Geoffery Lyons, M.D.      DATE OF BIRTH:  October 02, 1960   DATE OF ADMISSION:  06/11/2006  DATE OF DISCHARGE:                       PSYCHIATRIC ADMISSION ASSESSMENT   This is a voluntary admission to the services of Dr. Geoffery Lyons.   IDENTIFYING INFORMATION:  This is a 50 year old separated African-  American male.  Apparently, he presented at [facility] yesterday.  He stated that he has been estranged from his wife  for the past 3 to 4 months after 1 year of marriage.  He states he is  receiving mixed messages about reconciliation.  He states  I can't go  on.  Apparently, he dumped his Cogentin the toilet at [facility], although he did apologize for discarding the medication.  He stated that he could not contract to be safe in the community.  He  states his feelings are hurt, as his wife is due to receive her tax  refund check tomorrow, and plans to let her mother and other family  members spend it rather than him.   PAST PSYCHIATRIC HISTORY:  Mitchell [name] was last with [place] November 28 to  February 27, 2006.  he has been followed as an outpatient at [facility] for about 20 years.  He has had numerous admissions  to [facility] as well as here.   SOCIAL HISTORY:  He finished the 12th grade.  He states he has only had  1 marriage to his current wife, although they have been separated for  several months now, and he states he has one 57 year old son by another  woman.  He is not currently employed, although most recently he was  working in some capacity at [employer], and he lives in an apartment and  gets disability.   FAMILY HISTORY:  He denies.   ALCOHOL AND DRUG HISTORY:  He denies.   He denies have a primary care physician.   He is followed at [facility] by Dr. Hortencia Pilar.  He is known to have hypertension  and non-insulin-dependent diabetes mellitus that is managed by diet.  He  does not seem to have any antihypertensives prescribed to him.   MEDICATIONS:  He currently receives Prolixin decanoate 25 mg every 2  weeks.  He last received a dose June 07, 2006, and Cogentin 2 mg p.o.  q. day p.r.n. EPS.   ALLERGIES:  HE HAS NO KNOWN DRUG ALLERGIES.   POSITIVE PHYSICAL FINDINGS:  There were none.   He is a well-developed, well-nourished African-American male who appears  his stated age.  VITAL SIGNS ON ADMISSION:  He is 6 feet tall, he weighs 186, temperature  is 97.8, blood pressure was 124/70 to 138/84, pulse is 76 to 84,  respirations are 18.   LABS:  Pending.   MENTAL STATUS EXAM:  Today he is alert and oriented.  He is  appropriately groomed, dressed and nourished.  His speech is a little  slow, but I woke him up.  His mood he states is alright.  His affect had  a range today.  His thought processes are clear, rational and goal-  oriented.  He wants to stay here until he feels better.  Judgment and  insight are poor.  Concentration and memory are fair.  He denies being  actively suicidal or homicidal today.  He denies auditory/visual  hallucinations today.   DIAGNOSES:  AXIS I:  Schizoaffective disorder.  AXIS II:  Personality disorder, not otherwise specified.  AXIS III:  Reports a history for non-insulin-dependent diabetes  mellitus, diet-controlled, and hypertension for which he is not  prescribed any medications.  AXIS IV:  Severe, primary support issues.  AXIS V:  35.   PLAN:  Admit for safety and stabilization.  We will collaborate with the  [facility] regarding further support once discharged, and we will  adjust his meds as indicated.      Mickie Leonarda Salon, P.A.-C.      Geoffery Lyons, M.D.  Electronically Signed    MD/MEDQ  D:  06/12/2006  T:   06/13/2006  Job:  [id number]

## 2010-08-14 NOTE — Discharge Summary (Signed)
NAME:  Mitchell [name], Mitchell [name] NO.:  [id number]   MEDICAL RECORD NO.:  [id number]          PATIENT TYPE:  IPS   LOCATION:  0404                          FACILITY:  BH   PHYSICIAN:  Geoffery Lyons, M.D.      DATE OF BIRTH:  01-08-1961   DATE OF ADMISSION:  09/11/2004  DATE OF DISCHARGE:  09/16/2004                                 DISCHARGE SUMMARY   CHIEF COMPLAINT AND PRESENT ILLNESS:  This was the second admission to [facility] for this 50 year old single African-American male  involuntarily committed.  Reported missing his medication for three days.  Also being harassed by drug dealers, winos and prostitutes in his  neighborhood.  Became upset with his fiance's mother and threw his furniture  and clothing out of his front door.  He called the police and told them that  he wanted to go to the hospital to get some rest.  Denied any suicidal or  homicidal ideation.  Denied any psychosis or mania.   PAST PSYCHIATRIC HISTORY:  [facility] in February of 2006, [facility] in 2004, also in [place] in 1999.  Poor compliance with  medications.   ALCOHOL/DRUG HISTORY:  Denies the active use of any substances.   MEDICAL HISTORY:  Type 2 diabetes mellitus, hypertension.   MEDICATIONS:  Risperdal 1 mg in the morning and 2 at night, Depakote ER 250  mg in the morning and 500 mg at night.  Off for three days.   PHYSICAL EXAMINATION:  Performed and failed to show any acute findings.   LABORATORY DATA:  CBC within normal limits.  Blood chemistry within normal  limits.  Urine drug screen was negative for substances of abuse.  TSH was  within normal limits.   MENTAL STATUS EXAM:  Alert, cooperative male with good eye contact.  Bright  affect.  Somewhat expansive.  Appearance was very neat, well-groomed.  Behavior was cooperative.  Speech was hyperverbal to pressured with even  tone.  Mood was anxious, expansive.  Thought processes appeared to be  delusional and paranoid but there was no evidence of suicidal or homicidal  ideation.  No hallucinations.  Cognition was well-preserved.   ADMISSION DIAGNOSES:   AXIS I:  Bipolar disorder.   AXIS II:  No diagnosis.   AXIS III:  1.  Diabetes mellitus.  2.  Hypertension.   AXIS IV:  Moderate.   AXIS V:  Global Assessment of Functioning upon admission 28; highest Global  Assessment of Functioning in the last year 65.   HOSPITAL COURSE:  He was admitted.  He was started in individual and group  psychotherapy.  He was given Ambien for sleep.  He was restarted on  Risperdal 1 mg in the morning and 2 mg at night, Depakote 250 mg in the  morning and 500 mg at bedtime.  Depakote was changed to 500 mg twice a day.  Initially, his speech was pressured, irrational, hyperverbal, manic, elated,  obsessive.  Minimizes the events, the delusions.  He did endorse that he got  upset with the finance  and the mother.  Rather than hitting her, what he  would never do, he decided to throw out the furniture.  Endorsed he was off  his medication but before he was doing reasonably well.  Got more upset,  worried, concerned.  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  He claimed that he had a paper  to prove that he was a Conservator, museum/gallery.  Endorsed that he gets upset when he gets  challenged like they did about his clothing.  Endorsed that he felt harassed  by multiple people.  Still think that he is homosexual.  There was some  paranoia and some hyperreligiosity.  We continued to work with the  medication.  He was pretty compliant.  He, himself, was challenging his  disturbance of perception.  On June 21st, he was much better.  Endorsed he  was going to take his medication.  His mood was more euthymic.  Affect was  more within normal limits.  There was no evidence of any suicidal or  homicidal ideation.  No hallucinations.  There was no information shared   that would suggest active delusional ideas upon the time of discharge.   DISCHARGE DIAGNOSES:   AXIS I:  Bipolar disorder with some psychotic features.   AXIS II:  No diagnosis.   AXIS III:  1.  Diabetes mellitus.  2.  Arterial hypertension.   AXIS IV:  Moderate.   AXIS V:  Global Assessment of Functioning upon discharge 50.   DISCHARGE MEDICATIONS:  1.  Risperdal 1 mg in the morning and 2 at night.  2.  Depakote ER 500 mg twice a day.  3.  Ambien 10 mg at night for sleep.   FOLLOW UP:  [facility].       IL/MEDQ  D:  10/08/2004  T:  10/09/2004  Job:  [id number]

## 2010-08-14 NOTE — H&P (Signed)
NAME:  Mitchell [name], Mitchell [name] NO.:  [id number]   MEDICAL RECORD NO.:  [id number]          PATIENT TYPE:  IPS   LOCATION:  0404                          FACILITY:  BH   PHYSICIAN:  Geoffery Lyons, M.D.      DATE OF BIRTH:  [birth year]   DATE OF ADMISSION:  09/11/2004  DATE OF DISCHARGE:                         PSYCHIATRIC ADMISSION ASSESSMENT   IDENTIFYING INFORMATION:  This is a 50 year old single African-American male  who was involuntarily admitted to the [facility] on September 11, 2004.   HISTORY OF PRESENT ILLNESS:  The patient reports missing his medications for  3 days.  He reports also being harassed by drug dealers, winos and  prostitutes in his neighborhood.  He became upset with his fiancee's mother  and threw his furniture and clothing out of his front door.  He called the  police and told them that he wanted to go to the hospital to get some rest.  He denies any suicidal ideation or homicidal ideation.  He denies any  psychosis or mania.   PAST PSYCHIATRIC HISTORY:  He was at the [facility] in  February 2006.  He was in [facility] in 2004.  He was also  in [place] in 1999, he believes.  He is outpatient at [facility] who report that he has poor compliance.   SOCIAL HISTORY:  He lives alone in [place].  He collects disability.  He  has a 12th grade education and also attended [employer].  He has a 86 year old  son.  He has a fiancee for a year and 4 months.  He describes his fiancee  and his fiancee's mother as being his social support system.  He denies any  legal problems at this time.   FAMILY HISTORY:  He says that his mother is depressed.  He has a brother who  is on drugs, and has a sister on drugs and drinks alcohol.   ALCOHOL AND DRUG HISTORY:  He denies any alcohol or drug abuse.   PRIMARY CARE Arlene Genova:  Dr. Allyne Gee.   MEDICAL PROBLEMS:  1.  History of type 2 diabetes which is  now diet controlled.  2.  History of hypertension which is also diet controlled.   MEDICATIONS:  1.  Risperdal 1 mg in the morning and 2 mg at bedtime.  2.  Depakote ER 250 mg in the morning and 500 mg at bedtime.  These      medications he reports he has been off of for 3 days.   DRUG ALLERGIES:  He has no known drug allergies.   PHYSICAL EXAMINATION:  Exam was performed at [facility] emergency  department.  At the [facility] his vital signs were  temperature 97.8, pulse 92, respirations 20, blood pressure 118/79.   LABORATORY DATA:  His CBC was within normal limits.  His blood chemistries  were within normal limits.  His liver function tests were within normal  limits.  His urine drug screen was negative.  His blood alcohol level was  negative.  He has  a TSH pending.   MENTAL STATUS EXAM:  He is alert and oriented x 4.  He had good eye contact  with a bright affect.  His appearance was very neat and well groomed.  His  behavior was cooperative.  His speech was hyperverbal to pressured with even  tone.  His mood was anxious.  His thought process appeared to be delusional  and paranoid.  There was no evidence of suicidal or homicidal ideation or  evidence of psychosis observed during the interview, although he did appear  to be hypomanic to manic.  Cognitive function, his concentration was normal.  His memory was intact.  His insight is poor.  His impulse control is poor.   ADMISSION DIAGNOSES:   AXIS I:  Bipolar disorder with manic features.   AXIS II:  Deferred.   AXIS III:  History of diabetes and hypertension.   AXIS IV:  Moderate for occupational problems.   AXIS V:  Current global assessment of function of 28 with a past year range  of 60-65.   PLAN:  The initial plan is to admit the patient involuntarily and resume his  medications including Risperdal and Depakote.  We will work to stabilize him  to thought and mood, work to increase his coping skills  and decrease his  stress.  We will also try to have a family session with his fiancee and her  mother before discharge.  He will follow up with [facility].  His tentative length of stay is 5-7 days.       AHW/MEDQ  D:  09/12/2004  T:  09/12/2004  Job:  [id number]

## 2010-12-18 ENCOUNTER — Emergency Department (HOSPITAL_COMMUNITY)
Admission: EM | Admit: 2010-12-18 | Discharge: 2010-12-21 | Disposition: A | Discharge status: Other Acute Inpatient Hospital | Payer: Medicare Other | Attending: Emergency Medicine | Admitting: Emergency Medicine

## 2010-12-18 DIAGNOSIS — E119 Type 2 diabetes mellitus without complications: Secondary | ICD-10-CM | POA: Insufficient documentation

## 2010-12-18 DIAGNOSIS — F209 Schizophrenia, unspecified: Secondary | ICD-10-CM | POA: Insufficient documentation

## 2010-12-18 DIAGNOSIS — IMO0002 Reserved for concepts with insufficient information to code with codable children: Secondary | ICD-10-CM | POA: Insufficient documentation

## 2010-12-18 DIAGNOSIS — F29 Unspecified psychosis not due to a substance or known physiological condition: Secondary | ICD-10-CM | POA: Insufficient documentation

## 2010-12-18 LAB — CBC
HCT: 40.7 % (ref 39.0–52.0)
MCH: 32 pg (ref 26.0–34.0)
MCV: 89.3 fL (ref 78.0–100.0)
Platelets: 244 10*3/uL (ref 150–400)
RBC: 4.56 MIL/uL (ref 4.22–5.81)
WBC: 6.2 10*3/uL (ref 4.0–10.5)

## 2010-12-18 LAB — DIFFERENTIAL
Eosinophils Absolute: 0.1 10*3/uL (ref 0.0–0.7)
Eosinophils Relative: 2 % (ref 0–5)
Lymphocytes Relative: 33 % (ref 12–46)
Lymphs Abs: 2.1 10*3/uL (ref 0.7–4.0)
Monocytes Relative: 8 % (ref 3–12)
Neutrophils Relative %: 56 % (ref 43–77)

## 2010-12-18 LAB — COMPREHENSIVE METABOLIC PANEL
BUN: 12 mg/dL (ref 6–23)
CO2: 27 mEq/L (ref 19–32)
Calcium: 9.9 mg/dL (ref 8.4–10.5)
Chloride: 98 mEq/L (ref 96–112)
Creatinine, Ser: 1.01 mg/dL (ref 0.50–1.35)
GFR calc Af Amer: >60 mL/min (ref >60)
GFR calc non Af Amer: >60 mL/min (ref >60)
Glucose, Bld: 116 mg/dL — ABNORMAL HIGH (ref 70–99)
Total Bilirubin: 0.5 mg/dL (ref 0.3–1.2)

## 2010-12-18 LAB — RAPID URINE DRUG SCREEN, HOSP PERFORMED
Cocaine: NOT DETECTED
Opiates: NOT DETECTED
Tetrahydrocannabinol: NOT DETECTED

## 2010-12-18 LAB — ETHANOL: Alcohol, Ethyl (B): <11 mg/dL (ref 0–11)

## 2010-12-19 LAB — GLUCOSE, CAPILLARY

## 2010-12-20 LAB — GLUCOSE, CAPILLARY: Glucose-Capillary: 108 mg/dL — ABNORMAL HIGH (ref 70–99)

## 2011-01-22 ENCOUNTER — Emergency Department (HOSPITAL_COMMUNITY)
Admission: EM | Admit: 2011-01-22 | Discharge: 2011-01-25 | Disposition: A | Discharge status: Other Acute Inpatient Hospital | Payer: Medicare Other | Source: Home / Self Care | Attending: Emergency Medicine | Admitting: Emergency Medicine

## 2011-01-22 DIAGNOSIS — F29 Unspecified psychosis not due to a substance or known physiological condition: Secondary | ICD-10-CM | POA: Insufficient documentation

## 2011-01-22 DIAGNOSIS — I10 Essential (primary) hypertension: Secondary | ICD-10-CM | POA: Insufficient documentation

## 2011-01-22 DIAGNOSIS — E119 Type 2 diabetes mellitus without complications: Secondary | ICD-10-CM | POA: Insufficient documentation

## 2011-01-22 DIAGNOSIS — F209 Schizophrenia, unspecified: Secondary | ICD-10-CM | POA: Insufficient documentation

## 2011-01-22 LAB — RAPID URINE DRUG SCREEN, HOSP PERFORMED
Amphetamines: NOT DETECTED
Cocaine: NOT DETECTED
Opiates: NOT DETECTED

## 2011-01-22 LAB — COMPREHENSIVE METABOLIC PANEL
Alkaline Phosphatase: 115 U/L (ref 39–117)
BUN: 10 mg/dL (ref 6–23)
Chloride: 99 mEq/L (ref 96–112)
GFR calc Af Amer: >90 mL/min (ref >90)
Glucose, Bld: 117 mg/dL — ABNORMAL HIGH (ref 70–99)
Potassium: 3.9 mEq/L (ref 3.5–5.1)
Total Bilirubin: 0.6 mg/dL (ref 0.3–1.2)

## 2011-01-22 LAB — URINALYSIS, ROUTINE W REFLEX MICROSCOPIC
Hgb urine dipstick: NEGATIVE
Nitrite: NEGATIVE
Protein, ur: NEGATIVE mg/dL
Specific Gravity, Urine: 1.013 (ref 1.005–1.030)
Urobilinogen, UA: 0.2 mg/dL (ref 0.0–1.0)

## 2011-01-22 LAB — DIFFERENTIAL
Basophils Absolute: 0 10*3/uL (ref 0.0–0.1)
Basophils Relative: 0 % (ref 0–1)
Eosinophils Absolute: 0.1 10*3/uL (ref 0.0–0.7)
Monocytes Relative: 8 % (ref 3–12)
Neutrophils Relative %: 56 % (ref 43–77)

## 2011-01-22 LAB — ETHANOL: Alcohol, Ethyl (B): <11 mg/dL (ref 0–11)

## 2011-01-22 LAB — CBC
MCH: 31.3 pg (ref 26.0–34.0)
MCHC: 34.2 g/dL (ref 30.0–36.0)
Platelets: 249 10*3/uL (ref 150–400)
RBC: 4.79 MIL/uL (ref 4.22–5.81)

## 2011-01-24 LAB — GLUCOSE, CAPILLARY: Glucose-Capillary: 132 mg/dL — ABNORMAL HIGH (ref 70–99)

## 2011-01-25 ENCOUNTER — Inpatient Hospital Stay (HOSPITAL_COMMUNITY)
Admission: AD | Admit: 2011-01-25 | Discharge: 2011-02-09 | DRG: 885 | Disposition: A | Discharge status: Other Acute Inpatient Hospital | Payer: Medicare Other | Attending: Internal Medicine | Admitting: Internal Medicine

## 2011-01-25 DIAGNOSIS — Z6830 Body mass index (BMI) 30.0-30.9, adult: Secondary | ICD-10-CM

## 2011-01-25 DIAGNOSIS — E871 Hypo-osmolality and hyponatremia: Secondary | ICD-10-CM

## 2011-01-25 DIAGNOSIS — I959 Hypotension, unspecified: Secondary | ICD-10-CM | POA: Diagnosis present

## 2011-01-25 DIAGNOSIS — Z794 Long term (current) use of insulin: Secondary | ICD-10-CM

## 2011-01-25 DIAGNOSIS — I951 Orthostatic hypotension: Secondary | ICD-10-CM | POA: Diagnosis present

## 2011-01-25 DIAGNOSIS — F319 Bipolar disorder, unspecified: Secondary | ICD-10-CM

## 2011-01-25 DIAGNOSIS — Z5900 Homelessness unspecified: Secondary | ICD-10-CM

## 2011-01-25 DIAGNOSIS — Z87891 Personal history of nicotine dependence: Secondary | ICD-10-CM

## 2011-01-25 DIAGNOSIS — F259 Schizoaffective disorder, unspecified: Principal | ICD-10-CM

## 2011-01-25 DIAGNOSIS — I10 Essential (primary) hypertension: Secondary | ICD-10-CM

## 2011-01-25 DIAGNOSIS — R55 Syncope and collapse: Secondary | ICD-10-CM | POA: Diagnosis present

## 2011-01-25 DIAGNOSIS — IMO0001 Reserved for inherently not codable concepts without codable children: Secondary | ICD-10-CM

## 2011-01-25 DIAGNOSIS — T4275XA Adverse effect of unspecified antiepileptic and sedative-hypnotic drugs, initial encounter: Secondary | ICD-10-CM

## 2011-01-25 DIAGNOSIS — Z7982 Long term (current) use of aspirin: Secondary | ICD-10-CM

## 2011-01-25 DIAGNOSIS — E1165 Type 2 diabetes mellitus with hyperglycemia: Secondary | ICD-10-CM | POA: Diagnosis present

## 2011-01-25 HISTORY — DX: Essential (primary) hypertension: I10

## 2011-01-25 HISTORY — DX: Bipolar disorder, unspecified: F31.9

## 2011-01-25 HISTORY — DX: Mental disorder, not otherwise specified: F99

## 2011-01-25 LAB — GLUCOSE, CAPILLARY
Glucose-Capillary: 134 mg/dL — ABNORMAL HIGH (ref 70–99)
Glucose-Capillary: 141 mg/dL — ABNORMAL HIGH (ref 70–99)
Glucose-Capillary: 142 mg/dL — ABNORMAL HIGH (ref 70–99)

## 2011-01-26 DIAGNOSIS — F209 Schizophrenia, unspecified: Secondary | ICD-10-CM

## 2011-01-27 LAB — GLUCOSE, CAPILLARY: Glucose-Capillary: 211 mg/dL — ABNORMAL HIGH (ref 70–99)

## 2011-01-27 LAB — LIPID PANEL
LDL Cholesterol: 70 mg/dL (ref 0–99)
VLDL: 12 mg/dL (ref 0–40)

## 2011-01-28 DIAGNOSIS — F201 Disorganized schizophrenia: Secondary | ICD-10-CM

## 2011-01-28 LAB — COMPREHENSIVE METABOLIC PANEL
ALT: 15 U/L (ref 0–53)
AST: 16 U/L (ref 0–37)
Albumin: 3.9 g/dL (ref 3.5–5.2)
Calcium: 9.4 mg/dL (ref 8.4–10.5)
GFR calc Af Amer: >90 mL/min (ref >90)
Potassium: 4.3 mEq/L (ref 3.5–5.1)
Sodium: 136 mEq/L (ref 135–145)
Total Protein: 7.5 g/dL (ref 6.0–8.3)

## 2011-01-28 LAB — GLUCOSE, CAPILLARY
Glucose-Capillary: 168 mg/dL — ABNORMAL HIGH (ref 70–99)
Glucose-Capillary: 248 mg/dL — ABNORMAL HIGH (ref 70–99)
Glucose-Capillary: 311 mg/dL — ABNORMAL HIGH (ref 70–99)

## 2011-01-28 LAB — HEMOGLOBIN A1C: Mean Plasma Glucose: 146 mg/dL — ABNORMAL HIGH (ref <117)

## 2011-01-28 NOTE — Assessment & Plan Note (Signed)
  NAME:  Mitchell [name], MOZER NO.:  [id number]  MEDICAL RECORD NO.:  [id number]  LOCATION:  0406                          FACILITY:  BH  PHYSICIAN:  Eulogio Ditch, MD DATE OF BIRTH:  [birth year]  DATE OF ADMISSION:  01/25/2011 DATE OF DISCHARGE:                      PSYCHIATRIC ADMISSION ASSESSMENT   IDENTIFYING INFORMATION:  This is a 50 year old male here on involuntary papers.  REASON FOR ADMISSION:  The patient again is here on commitment.  Papers state the patient is actively psychotic, religiously preoccupied, brought in by [facility] from a bus depot where he was talking to himself and noncompliant with his medications.  The patient today denies any suicidal or homicidal thoughts or hearing any voices. It was noted in the patient's record that if he was "[employer].  PAST PSYCHIATRIC HISTORY:  The patient has been here before.  Unclear of his outpatient follow up.  SOCIAL HISTORY:  Listed as the patient is married and he resides in [place].  No other information available.  FAMILY HISTORY:  None known.  ALCOHOL AND DRUG HISTORY:  The patient denies.  PRIMARY CARE PROVIDER:  None.  MEDICAL PROBLEMS:  Past history noted from prior records indicates hypertension.  The patient was hospitalized in February for history of syncope and type 1 diabetes.  MEDICATIONS:  Currently listed in his chart: 1. Zyprexa 10 mg. 2. Klonopin. 3. Cogentin.  DRUG ALLERGIES:  No known allergies.  PHYSICAL EXAM:  This is a middle-aged male assessed in the emergency department.  He appears well-nourished, well-developed.  Records note the patient was rambling and hallucinating, pulling stickers off of switch plates.  He did receive Geodon during his emergency room stay.  MENTAL STATUS EXAM:  The patient was seen with Dr. Rogers Blocker.  He is resting in bed, drowsy.  He answers briefly.  Speech is soft.  He is overall a poor historian.   He does, however, deny any suicidal or homicidal thoughts or voices.  Judgment and insight at this time appear to be poor.  DIAGNOSES:  Axis I:  Psychosis not otherwise specified. Axis II:  Deferred. Axis III:  History of hypertension, diabetes. Axis IV:  Other psychosocial problems related to chronic mental illness, noncompliance with medications. Axis V:  Current is 30.  PLAN:  Our plan is to continue with the Geodon at 40 mg b.i.d. with food.  We will also obtain a fasting lipid profile and EKG.  Have Benadryl for sleep and Ativan as needed for anxiety and agitation.  We will also have contact with his wife for any other safety concerns.  His tentative length of stay at this time is 4-6 days.     Landry Corporal, N.P.   ______________________________ Eulogio Ditch, MD    JO/MEDQ  D:  01/26/2011  T:  01/26/2011  Job:  [id number]  Electronically Signed by Limmie PatriciaP. on 01/27/2011 09:17:40 AM Electronically Signed by Eulogio Ditch  on 01/28/2011 12:12:49 PM

## 2011-01-29 LAB — GLUCOSE, CAPILLARY: Glucose-Capillary: 154 mg/dL — ABNORMAL HIGH (ref 70–99)

## 2011-01-29 MED ORDER — LORAZEPAM 1 MG PO TABS
2.0000 mg | ORAL_TABLET | Freq: Four times a day (QID) | ORAL | Status: DC | PRN
  Administered 2011-02-02 – 2011-02-09 (×5): 2 mg via ORAL
  Filled 2011-01-29 (×3): qty 2
  Filled 2011-01-29: qty 1

## 2011-01-29 MED ORDER — CLONIDINE HCL 0.1 MG PO TABS
0.1000 mg | ORAL_TABLET | ORAL | Status: DC
  Administered 2011-01-31 – 2011-02-06 (×13): 0.1 mg via ORAL
  Filled 2011-01-29 (×14): qty 1

## 2011-01-29 MED ORDER — HYDROCHLOROTHIAZIDE 25 MG PO TABS
25.0000 mg | ORAL_TABLET | Freq: Every day | ORAL | Status: DC
  Administered 2011-01-31 – 2011-02-06 (×7): 25 mg via ORAL
  Filled 2011-01-29 (×7): qty 1

## 2011-01-29 MED ORDER — ZIPRASIDONE HCL 80 MG PO CAPS
80.0000 mg | ORAL_CAPSULE | Freq: Two times a day (BID) | ORAL | Status: DC
  Administered 2011-01-31 – 2011-02-02 (×5): 80 mg via ORAL
  Filled 2011-01-29 (×6): qty 1

## 2011-01-29 MED ORDER — ACETAMINOPHEN 325 MG PO TABS: 650.0000 mg | ORAL_TABLET | Freq: Four times a day (QID) | ORAL | Status: DC | PRN

## 2011-01-29 MED ORDER — MAGNESIUM HYDROXIDE 400 MG/5ML PO SUSP: 30.0000 mL | Freq: Every day | ORAL | Status: DC | PRN

## 2011-01-29 MED ORDER — ASPIRIN 81 MG PO CHEW
81.0000 mg | CHEWABLE_TABLET | Freq: Every day | ORAL | Status: DC
  Administered 2011-01-31 – 2011-02-09 (×10): 81 mg via ORAL
  Filled 2011-01-29 (×11): qty 1

## 2011-01-29 MED ORDER — DIPHENHYDRAMINE HCL 25 MG PO CAPS
50.0000 mg | ORAL_CAPSULE | Freq: Every evening | ORAL | Status: DC | PRN
  Administered 2011-02-03 – 2011-02-04 (×3): 50 mg via ORAL
  Filled 2011-01-29: qty 1

## 2011-01-29 MED ORDER — ALUM & MAG HYDROXIDE-SIMETH 200-200-20 MG/5ML PO SUSP: 30.0000 mL | ORAL | Status: DC | PRN

## 2011-01-29 MED ORDER — INSULIN ASPART 100 UNIT/ML ~~LOC~~ SOLN
0.0000 [IU] | Freq: Three times a day (TID) | SUBCUTANEOUS | Status: DC
  Administered 2011-01-31: 11 [IU] via SUBCUTANEOUS
  Administered 2011-01-31: 3 [IU] via SUBCUTANEOUS
  Administered 2011-02-01: 8 [IU] via SUBCUTANEOUS
  Administered 2011-02-01: 15 [IU] via SUBCUTANEOUS
  Administered 2011-02-01: 3 [IU] via SUBCUTANEOUS
  Administered 2011-02-02: 5 [IU] via SUBCUTANEOUS
  Administered 2011-02-02: 8 [IU] via SUBCUTANEOUS
  Filled 2011-01-29: qty 3

## 2011-01-30 LAB — GLUCOSE, CAPILLARY
Glucose-Capillary: 160 mg/dL — ABNORMAL HIGH (ref 70–99)
Glucose-Capillary: 234 mg/dL — ABNORMAL HIGH (ref 70–99)

## 2011-01-31 LAB — GLUCOSE, CAPILLARY: Glucose-Capillary: 246 mg/dL — ABNORMAL HIGH (ref 70–99)

## 2011-01-31 NOTE — Progress Notes (Signed)
Pt has been up and has been visible in milieu today, has had limited interaction or participation, thoughts disorganized, speech pressured, making various religious ideas of reference, has stated that he has felt fine today and that there is nothing wrong with him, pt has received medications today without incident, support provided, will continue to monitor.

## 2011-01-31 NOTE — Progress Notes (Signed)
[facility] MD Progress Note  01/31/2011 5:44 PM Subjective: Pt. Is upright and dressed and in the group milieu.2 Diagnosis:  Axis I: Psychotic Disorder NOS  ADL's:  Intact  Sleep:  Yes,  AEB:  Appetite:  Yes,  AEB:  Suicidal Ideation:   Plan:  No  Intent:  No  Means:  No  Homicidal Ideation:   Plan:  No  Intent:  No  Means:  No  AEB (as evidenced by):  Mental Status: General Appearance Mitchell [name]:  Neat Eye Contact:  Good Motor Behavior:  Normal Grandiose in speech and actions. Speech:  Normal Level of Consciousness:  Alert Mood:  Euphoric Affect:  Inappropriate Anxiety Level:  None Thought Process:  Disorganized Thought Content:  Obsessions focused on religion. Perception:  Normal Judgment:  Poor Insight:  Absent Cognition:  Orientation time Memory Immediate Sleep:     Vital Signs:Blood pressure 166/100, pulse 68, temperature 97 F (36.1 C), temperature source Oral, resp. rate 20, height 5\' 4"  (1.626 m), weight 178 lb (80.74 kg).  Lab Results:  Results for orders placed during the hospital encounter of 01/25/11 (from the past 48 hour(s))  GLUCOSE, CAPILLARY     Status: Abnormal   Collection Time   01/29/11  9:12 PM      Component Value Range Comment   Glucose-Capillary 154 (*) 70 - 99 (mg/dL)   GLUCOSE, CAPILLARY     Status: Abnormal   Collection Time   01/30/11  6:07 AM      Component Value Range Comment   Glucose-Capillary 198 (*) 70 - 99 (mg/dL)    Comment 1 Notify RN     GLUCOSE, CAPILLARY     Status: Abnormal   Collection Time   01/30/11 11:48 AM      Component Value Range Comment   Glucose-Capillary 160 (*) 70 - 99 (mg/dL)    Comment 1 Notify RN     GLUCOSE, CAPILLARY     Status: Abnormal   Collection Time   01/30/11  5:20 PM      Component Value Range Comment   Glucose-Capillary 234 (*) 70 - 99 (mg/dL)    Comment 1 Notify RN     GLUCOSE, CAPILLARY     Status: Abnormal   Collection Time   01/30/11  9:26 PM      Component Value Range Comment   Glucose-Capillary 255 (*) 70 - 99 (mg/dL)    Comment 1 Notify RN     GLUCOSE, CAPILLARY     Status: Abnormal   Collection Time   01/31/11  6:36 AM      Component Value Range Comment   Glucose-Capillary 156 (*) 70 - 99 (mg/dL)   GLUCOSE, CAPILLARY     Status: Abnormal   Collection Time   01/31/11 11:56 AM      Component Value Range Comment   Glucose-Capillary 157 (*) 70 - 99 (mg/dL)    Comment 1 Notify RN     GLUCOSE, CAPILLARY     Status: Abnormal   Collection Time   01/31/11  5:22 PM      Component Value Range Comment   Glucose-Capillary 341 (*) 70 - 99 (mg/dL)    Comment 1 Notify RN       Physical Findings: AIMS:  , ,  ,  ,    CIWA:    COWS:     Treatment Plan Summary: Daily contact with patient to assess and evaluate symptoms and progress in treatment Medication management  Plan: Continue current plan of  care no changes at this time.  Caspian Deleonardis 01/31/2011, 5:44 PM

## 2011-01-31 NOTE — Progress Notes (Signed)
01/31/2011 11:00 AM  Type of Therapy:  Group Therapy, Dance/Movement Therapy   Participation Level:  Did Not Attend  Mitchell [name] 01/31/2011, 11:53 AM 

## 2011-02-01 LAB — GLUCOSE, CAPILLARY: Glucose-Capillary: 205 mg/dL — ABNORMAL HIGH (ref 70–99)

## 2011-02-01 MED ORDER — DIVALPROEX SODIUM ER 500 MG PO TB24
750.0000 mg | ORAL_TABLET | Freq: Every day | ORAL | Status: DC
  Administered 2011-02-01: 250 mg via ORAL
  Administered 2011-02-02 – 2011-02-08 (×7): 750 mg via ORAL
  Filled 2011-02-01: qty 1
  Filled 2011-02-01: qty 2
  Filled 2011-02-01 (×7): qty 1

## 2011-02-01 NOTE — Progress Notes (Signed)
[facility] Group Notes:  (Counselor/Nursing/MHT/Case Management/Adjunct)  02/01/2011 2:27 PM  Type of Therapy:  Group Therapy Participation Level:  Minimal  Participation Quality:  Intrusive, Inattentive and Resistant  Affect:  Irritable  Cognitive:  Hallucinating  Insight:  None  Engagement in Group:  None  Engagement in Therapy:  None  Modes of Intervention:  Education and Orientation  Summary of Progress/Problems: Patient appeared worse today. Preoccupied with voices. He kept yelling things out. Irritable. At one point yelled out " just kill me". He did not respond to redirection and eventually had to be asked to leave group. He stomped out, slamming the door. Later he joined the group but affect remained inappropriate with extreme smiling. He was making gestures and talking to himself but wasn't as verbally disruptive as he had been in the first part of the group.   Mitchell [name], Mitchell [name] 02/01/2011, 2:27 PM

## 2011-02-01 NOTE — Progress Notes (Signed)
Patient marked all answers to self inventory sheet.  Sleeps well, fair, poor, requested medication.  Appetite good, improving, poor.  Energy level low, normal, high, hyper.  Attention span good, improving, poor.  Depression and hopelessness BMO.  Experienced tremors, sedation, diarrhea, chilling, cravings, agitation.  SI, Yes, No, off/on, constant.  Denied SI while talking to nurse.  Denied HI.  Denied A/V halluciantions.  Denied pain.  Stated to nurse that he slept good.  Wants to go home.  Marks zeros on self inventory paper.

## 2011-02-01 NOTE — Discharge Planning (Signed)
Patient attended Aftercare Planning Group, had nothing he wished to talk about today.  Was not as happy, energetic as in previous groups last week.  Expressed belief he would be discharged today.  Left group for awhile, then returned, changing chairs.  Did not display interest in other patients' statements like had been doing previously, either.  Seemed almost sullen in comparison.  No case management needs expressed today.  [name] 02/01/2011 4:01 PM   Per State Regulation 482.30 This chart was reviewed for medical necessity with respect to the patient's Admission/Duration of stay.  Ambrose Mantle, LCSW 02/01/2011 Next review date 02/04/2011

## 2011-02-01 NOTE — Progress Notes (Signed)
[facility] MD Progress Note  02/01/2011 9:59 AM  Diagnosis:  Axis I: Schizoaffective Disorder  ADL'[name]:  Impaired  Sleep:  Yes,  AEB:  Appetite:  Yes,  AEB:  Suicidal Ideation:   Plan:  No  Intent:  No  Means:  No  Homicidal Ideation:   Plan:  No  Intent:  No  Means:  No  AEB (as evidenced by):  Mental Status: General Appearance Mitchell [name]:  Bizarre Eye Contact:  Minimal Motor Behavior:  Normal Speech:  Normal Level of Consciousness:  Alert Mood:  Euphoric Affect:  Inappropriate Anxiety Level:  Minimal Thought Process:  Disorganized Thought Content:  Rumination Perception:  Normal Judgment:  poor Insight:  Absent Cognition:  Orientation place Sleep:     Vital Signs:Blood pressure 158/104, pulse 75, temperature 97.8 F (36.6 C), temperature source Oral, resp. rate 17, height 5\' 4"  (1.626 m), weight 80.74 kg (178 lb).  Lab Results:  Results for orders placed during the hospital encounter of 01/25/11 (from the past 48 hour([name]))  GLUCOSE, CAPILLARY     Status: Abnormal   Collection Time   01/30/11 11:48 AM      Component Value Range Comment   Glucose-Capillary 160 (*) 70 - 99 (mg/dL)    Comment 1 Notify RN     GLUCOSE, CAPILLARY     Status: Abnormal   Collection Time   01/30/11  5:20 PM      Component Value Range Comment   Glucose-Capillary 234 (*) 70 - 99 (mg/dL)    Comment 1 Notify RN     GLUCOSE, CAPILLARY     Status: Abnormal   Collection Time   01/30/11  9:26 PM      Component Value Range Comment   Glucose-Capillary 255 (*) 70 - 99 (mg/dL)    Comment 1 Notify RN     GLUCOSE, CAPILLARY     Status: Abnormal   Collection Time   01/31/11  6:36 AM      Component Value Range Comment   Glucose-Capillary 156 (*) 70 - 99 (mg/dL)   GLUCOSE, CAPILLARY     Status: Abnormal   Collection Time   01/31/11 11:56 AM      Component Value Range Comment   Glucose-Capillary 157 (*) 70 - 99 (mg/dL)    Comment 1 Notify RN     GLUCOSE, CAPILLARY     Status: Abnormal   Collection Time   01/31/11  5:22 PM      Component Value Range Comment   Glucose-Capillary 341 (*) 70 - 99 (mg/dL)    Comment 1 Notify RN     GLUCOSE, CAPILLARY     Status: Abnormal   Collection Time   01/31/11  8:53 PM      Component Value Range Comment   Glucose-Capillary 246 (*) 70 - 99 (mg/dL)    Comment 1 Notify RN     GLUCOSE, CAPILLARY     Status: Abnormal   Collection Time   02/01/11  6:12 AM      Component Value Range Comment   Glucose-Capillary 151 (*) 70 - 99 (mg/dL)     Physical Findings: AIMS:  , ,  ,  ,    CIWA:    COWS:     Treatment Plan Summary: Daily contact with patient to assess and evaluate symptoms and progress in treatment Medication management  Plan: Added depakote er 750mg  po daily for mood lability.  Mitchell [name],Mitchell [name] 02/01/2011, 9:59 AM

## 2011-02-01 NOTE — Progress Notes (Signed)
  Pt. Was pacing in the hallway and looking out the doors early in the shift.  Pt. Was smiling and laughing, when writer approached to ask about his day.  Pt. Did not stand still to respond but kept walking while talking.  Writer got pt.'s attention by mentioning that it was reported that he liked to sing.  Pt. Then stopped and talked briefly and stated that he liked to sing religious music.   Then said songs about "God and [address], [address], God and [employer]. I am Vice president ".  Pt. Continued walking and singing at that point.  Pt.  Became angry at the tech because he wanted clothes from the clothes closet. Pt. Has many clothes in his room and became angry when tech explained to him these clothes were for people that did not have any.  Pt. Isolated in his room but was cooperative when Clinical research associate and tech went in to ck his CBG and his VS this pm.  Pt. Also came back to med window and took his HS medications.  No complaints of pain or discomfort noted this pm.

## 2011-02-02 LAB — GLUCOSE, CAPILLARY
Glucose-Capillary: 260 mg/dL — ABNORMAL HIGH (ref 70–99)
Glucose-Capillary: 252 mg/dL — ABNORMAL HIGH (ref 70–99)
Glucose-Capillary: 317 mg/dL — ABNORMAL HIGH (ref 70–99)
Glucose-Capillary: 315 mg/dL — ABNORMAL HIGH (ref 70–99)

## 2011-02-02 MED ORDER — INSULIN ASPART 100 UNIT/ML ~~LOC~~ SOLN
0.0000 [IU] | Freq: Three times a day (TID) | SUBCUTANEOUS | Status: DC
  Administered 2011-02-02 – 2011-02-03 (×2): 8 [IU] via SUBCUTANEOUS
  Administered 2011-02-03 (×2): 2 [IU] via SUBCUTANEOUS
  Administered 2011-02-04: 3 [IU] via SUBCUTANEOUS
  Filled 2011-02-02: qty 3

## 2011-02-02 MED ORDER — RISPERIDONE 2 MG PO TABS
2.0000 mg | ORAL_TABLET | Freq: Two times a day (BID) | ORAL | Status: DC
  Administered 2011-02-02 – 2011-02-08 (×12): 2 mg via ORAL
  Filled 2011-02-02 (×18): qty 1

## 2011-02-02 NOTE — Progress Notes (Signed)
[facility] Group Notes:  (Counselor/Nursing/MHT/Case Management/Adjunct)  02/02/2011 2:32 PM  Type of Therapy:  Group Therapy  Participation Level:  None  Summary of Progress/Problems: Patient slept through group   Craig Wisnewski 02/02/2011, 2:32 PM

## 2011-02-02 NOTE — Progress Notes (Signed)
Patient attended Aftercare Planning Group but was in and out of the room several times.  When it was his turn, all his conversation was based in delusion about him being 1) Jesus Christ, 2) the father of President Obama, and 3) the [employer] [place].  He was unable to logically answer questions about his living situation and support person.  Instead, he asked that we contact his son, Emily Filbert, to verify that he owns all the money that is under the ground and under the water all over the world.  Patient appeared depressed and affect was flat, but he loudly declared that he is happy and nothing is wrong with him.  Progress on goals:  1.  Reduce psychosis and delusions to baseline:  Patient is not progressing.  Doctor decided today to switch medications as a result of patient's lack of improvement.  2.  Learn  3 coping skills to deal with anger:  Patient denies having any anger, so has not progressed on this goal.  3.  Increase willingness to comply with meds:  Patient is taking meds while in the hospital.  When he is more stable we will discuss his aftercare and try to achieve a commitment to that.  4.  Patient needs medical follow-up:  This will need to be confirmed with NP.  Ambrose Mantle, LCSW 02/02/2011  1302PM

## 2011-02-02 NOTE — Progress Notes (Signed)
[facility] MD Progress Note  02/02/2011 11:23 AM  Diagnosis:  Axis I: Schizoaffective Disorder  ADL's:  Intact  Sleep:  Yes,  AEB:  Appetite:  Yes,  AEB:  Suicidal Ideation:   Plan:  No  Intent:  No  Means:  No  Homicidal Ideation:   Plan:  No  Intent:  No  Means:  No  AEB (as evidenced by):  Mental Status: General Appearance Mitchell [name]:  Disheveled Eye Contact:  Fair Motor Behavior:  Agitated on and off Speech:  Pressured Level of Consciousness:  Alert Mood:  Dysphoric, Euthymic and Irritable Affect:  Inappropriate, Labile and Tearful Anxiety Level:  Minimal Thought Process:  Circumstantial, Tangential and Disorganized Thought Content:  Paranoid Ideation Perception:  Normal Judgment:  Poor Insight:  Absent Cognition:  Orientation time, place and person Sleep:     Vital Signs:Blood pressure 164/99, pulse 89, temperature 97 F (36.1 C), temperature source Oral, resp. rate 20, height 5\' 4"  (1.626 m), weight 80.74 kg (178 lb).  Lab Results:  Results for orders placed during the hospital encounter of 01/25/11 (from the past 48 hour(s))  GLUCOSE, CAPILLARY     Status: Abnormal   Collection Time   01/31/11 11:56 AM      Component Value Range Comment   Glucose-Capillary 157 (*) 70 - 99 (mg/dL)    Comment 1 Notify RN     GLUCOSE, CAPILLARY     Status: Abnormal   Collection Time   01/31/11  5:22 PM      Component Value Range Comment   Glucose-Capillary 341 (*) 70 - 99 (mg/dL)    Comment 1 Notify RN     GLUCOSE, CAPILLARY     Status: Abnormal   Collection Time   01/31/11  8:53 PM      Component Value Range Comment   Glucose-Capillary 246 (*) 70 - 99 (mg/dL)    Comment 1 Notify RN     GLUCOSE, CAPILLARY     Status: Abnormal   Collection Time   02/01/11  6:12 AM      Component Value Range Comment   Glucose-Capillary 151 (*) 70 - 99 (mg/dL)   GLUCOSE, CAPILLARY     Status: Abnormal   Collection Time   02/01/11 11:46 AM      Component Value Range Comment   Glucose-Capillary 283 (*) 70 - 99 (mg/dL)   GLUCOSE, CAPILLARY     Status: Abnormal   Collection Time   02/01/11  4:57 PM      Component Value Range Comment   Glucose-Capillary 361 (*) 70 - 99 (mg/dL)    Comment 1 Notify RN     GLUCOSE, CAPILLARY     Status: Abnormal   Collection Time   02/01/11  9:19 PM      Component Value Range Comment   Glucose-Capillary 205 (*) 70 - 99 (mg/dL)   GLUCOSE, CAPILLARY     Status: Abnormal   Collection Time   02/02/11  5:54 AM      Component Value Range Comment   Glucose-Capillary 247 (*) 70 - 99 (mg/dL)     Physical Findings: AIMS:  , ,  ,  ,    CIWA:    COWS:     Treatment Plan Summary: As pateint is not responding to geodon will change to risperdal 2mg  po qam and qhs.  Plan:  AHLUWALIA,SHAMSHER S 02/02/2011, 11:23 AM

## 2011-02-02 NOTE — Progress Notes (Addendum)
On patient's self inventory sheet, patient checked all blocks.  Slept well, fair, poor,  Requested medication.   Appetite is good, improving, poor.  Energy level low, normal, high, hyper.  Pay attention good, improving, poor.  BMO for depression and hopelessness.  Withdrawals, tremors, sedation, diarrhea, chilling, cravings, agitation.  SI yes, no, off/on, constant.  Physical problems lightheaded, pain, dizzy, headaches, rash, blurred vision, then wrote [id number], plan BOM.  Reminder of form wrote numbers.   Signed BMO.  1400  Patient's CBG was 260.  Novolog 8 units sq given per MD order.   Patient has been appropriate and cooperative.  Attending groups, going to dining room for meals.

## 2011-02-03 LAB — GLUCOSE, CAPILLARY: Glucose-Capillary: 281 mg/dL — ABNORMAL HIGH (ref 70–99)

## 2011-02-03 MED ORDER — RISPERIDONE 2 MG PO TABS
2.0000 mg | ORAL_TABLET | Freq: Once | ORAL | Status: AC
  Administered 2011-02-03: 2 mg via ORAL

## 2011-02-03 MED ORDER — LORAZEPAM 1 MG PO TABS
2.0000 mg | ORAL_TABLET | Freq: Once | ORAL | Status: AC
  Administered 2011-02-03: 2 mg via ORAL

## 2011-02-03 MED ORDER — RISPERIDONE 1 MG PO TABS
ORAL_TABLET | ORAL | Status: AC
  Filled 2011-02-03: qty 2

## 2011-02-03 MED ORDER — LORAZEPAM 1 MG PO TABS
ORAL_TABLET | ORAL | Status: AC
  Filled 2011-02-03: qty 2

## 2011-02-03 NOTE — Progress Notes (Signed)
[facility] MD Progress Note  02/03/2011 9:53 AM  Diagnosis:  Axis I: Schizoaffective Disorder  ADL'[name]:  Impaired  Sleep:  Yes,  AEB:  Appetite:  Yes,  AEB:  Suicidal Ideation:   Plan:  No  Intent:  No  Means:  No  Homicidal Ideation:   Plan:  No  Intent:  No  Means:  No  AEB (as evidenced by):  Mental Status: General Appearance Mitchell [name]:  Guarded Eye Contact:  Fair Motor Behavior:  Restlestness Speech:  Normal Level of Consciousness:  Drowsy Mood:  Dysphoric Affect:  Labile Anxiety Level:  Minimal Thought Process:  Disorganized Thought Content:  Rumination Perception:  Normal Judgment:  Poor Insight:  Absent Cognition:  Orientation time, place and person Sleep:     Vital Signs:Blood pressure 144/90, pulse 89, temperature 98.7 F (37.1 C), temperature source Oral, resp. rate 18, height 5\' 4"  (1.626 m), weight 80.74 kg (178 lb).  Lab Results:  Results for orders placed during the hospital encounter of 01/25/11 (from the past 48 hour([name]))  GLUCOSE, CAPILLARY     Status: Abnormal   Collection Time   02/01/11 11:46 AM      Component Value Range Comment   Glucose-Capillary 283 (*) 70 - 99 (mg/dL)   GLUCOSE, CAPILLARY     Status: Abnormal   Collection Time   02/01/11  4:57 PM      Component Value Range Comment   Glucose-Capillary 361 (*) 70 - 99 (mg/dL)    Comment 1 Notify RN     GLUCOSE, CAPILLARY     Status: Abnormal   Collection Time   02/01/11  9:19 PM      Component Value Range Comment   Glucose-Capillary 205 (*) 70 - 99 (mg/dL)   GLUCOSE, CAPILLARY     Status: Abnormal   Collection Time   02/02/11  5:54 AM      Component Value Range Comment   Glucose-Capillary 247 (*) 70 - 99 (mg/dL)   GLUCOSE, CAPILLARY     Status: Abnormal   Collection Time   02/02/11 12:55 PM      Component Value Range Comment   Glucose-Capillary 260 (*) 70 - 99 (mg/dL)   GLUCOSE, CAPILLARY     Status: Abnormal   Collection Time   02/02/11  4:47 PM      Component Value Range Comment     Glucose-Capillary 252 (*) 70 - 99 (mg/dL)   GLUCOSE, CAPILLARY     Status: Abnormal   Collection Time   02/02/11  8:18 PM      Component Value Range Comment   Glucose-Capillary 317 (*) 70 - 99 (mg/dL)   GLUCOSE, CAPILLARY     Status: Abnormal   Collection Time   02/02/11  8:49 PM      Component Value Range Comment   Glucose-Capillary 315 (*) 70 - 99 (mg/dL)   GLUCOSE, CAPILLARY     Status: Abnormal   Collection Time   02/03/11  6:09 AM      Component Value Range Comment   Glucose-Capillary 146 (*) 70 - 99 (mg/dL)      Plan: We'll continue with the current treatment plan. Patient was agitated last night and he was given when necessary medications.  Mitchell [name] 02/03/2011, 9:53 AM
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
## 2011-02-03 NOTE — Progress Notes (Signed)
  Pt. Was dressed in a suit  This pm, pt. Was singing and laughing and again reports he is happy, happy , happy.  This is the third evening the pt. Goes from happy to angry and bizarre as soon as group ends.  The pt. Starts pacing in the hall, going into his room and slamming the door  And rambling to self.  Pt. Did accept his HS medications. Will continue to monitor for safety .  No complaints of pain or discomfort noted at this time.

## 2011-02-03 NOTE — Progress Notes (Signed)
Met with Patient at Aftercare Planning Group, where he slept throughout the group and could not be awakened sufficiently to talk in a meaningful fashion when his turn came.  He had some drooling as well.    Progress on goals:  1. Reduce psychosis and delusions to baseline: Meds being adjusted to address ongoing symptoms. 2. Learn 3 coping skills to deal with anger: Patient denies having any anger, so has not progressed on this goal.  3. Increase willingness to comply with meds: Patient is taking meds while in the hospital. When he is more stable we will discuss his aftercare and try to achieve a commitment to that.  4. Patient needs medical follow-up: This will need to be confirmed with NP.  [name] 02/03/2011 11:57 AM

## 2011-02-03 NOTE — Progress Notes (Signed)
[facility] Group Notes:  (Counselor/Nursing/MHT/Case Management/Adjunct)  02/03/2011 3:35 PM  Type of Therapy:  Group Therapy  Participation Level:  Did not attend   Meggen Spaziani 02/03/2011, 3:35 PM

## 2011-02-03 NOTE — Progress Notes (Signed)
Pt. Was agitated and pacing the floors. Pt. Was offered a PRN of 2mg  of ativan.  Pt. Took the medication but continued to pace the floors and talk loud.

## 2011-02-03 NOTE — Progress Notes (Signed)
Pt.  Was still awake and pacing the  Hallways.  Pt. Was again offered his HS benadryl which he accepted.

## 2011-02-03 NOTE — Progress Notes (Signed)
Recreation Therapy Group Note  Date: 02/03/2011         Time: 0930      Group Topic/Focus: The focus of this group is on enhancing the patient's understanding of leisure, barriers to leisure, and the importance of engaging in positive leisure activities upon discharge for improved total health.  Participation Level: Did not attend  Participation Quality: Not Applicable  Affect: Not Applicable  Cognitive: Not Applicable   Additional Comments: None.   Joell Usman 02/03/2011 10:19 AM  
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
## 2011-02-04 LAB — GLUCOSE, CAPILLARY
Glucose-Capillary: 315 mg/dL — ABNORMAL HIGH (ref 70–99)
Glucose-Capillary: 147 mg/dL — ABNORMAL HIGH (ref 70–99)
Glucose-Capillary: 321 mg/dL — ABNORMAL HIGH (ref 70–99)

## 2011-02-04 MED ORDER — INSULIN ASPART 100 UNIT/ML ~~LOC~~ SOLN
0.0000 [IU] | Freq: Three times a day (TID) | SUBCUTANEOUS | Status: DC
  Administered 2011-02-04: 2 [IU] via SUBCUTANEOUS
  Administered 2011-02-04: 11 [IU] via SUBCUTANEOUS
  Administered 2011-02-05 (×2): 5 [IU] via SUBCUTANEOUS
  Administered 2011-02-05: 3 [IU] via SUBCUTANEOUS
  Administered 2011-02-06: 5 [IU] via SUBCUTANEOUS
  Administered 2011-02-06: 15 [IU] via SUBCUTANEOUS
  Administered 2011-02-06 – 2011-02-08 (×5): 3 [IU] via SUBCUTANEOUS

## 2011-02-04 MED ORDER — INSULIN ASPART 100 UNIT/ML ~~LOC~~ SOLN
3.0000 [IU] | Freq: Three times a day (TID) | SUBCUTANEOUS | Status: DC
  Administered 2011-02-04 – 2011-02-09 (×14): 3 [IU] via SUBCUTANEOUS

## 2011-02-04 NOTE — Progress Notes (Signed)
Patient remains delusional and religiously preoccupied.  Today he states that his name is not Mitchell [name].  He became angry at me this afternoon when I asked him to stop singing so loudly.  He has been pacing in the hall much of the day.  Attending some groups.  Taking medications as ordered.  Appetite good.

## 2011-02-04 NOTE — Progress Notes (Signed)
[facility] Group Notes:  (Counselor/Nursing/MHT/Case Management/Adjunct)  02/04/2011 2:15 PM  Type of Therapy:  Group Therapy  Participation Level:  None  Participation Quality:  Drowsy  Affect:  Asleep  Cognitive:  Confused  Insight:  None  Engagement in Group:  None  Engagement in Therapy:  None  Modes of Intervention:  Education and Support  Summary of Progress/Problems: Patient slept through group, however when his name was called he stated that he had never been asleep. Conversation was illogical.   Veto Kemps 02/04/2011, 2:15 PM

## 2011-02-04 NOTE — Progress Notes (Signed)
  Mitchell [name] is up and socializing in the hall.  Affect and manner are guarded with poor eye contact.  He remembers me from previous discussions.  He continues to have episodes of agitation.  Thinking is disorganized and he appears internally disctracted.  He speaks to me easily with social stereotypes, but is not willing to have any meaningful discussion - he excuses himself and goes off to the day room.    His CBG's continue to be quite elevated during the day - ranging from 149-300+.  He is allowing the nurse to give him his sliding scale novolog coverage but is not on any basal insulin at this time.  He has consistently refused to follow up with medical care.  And he declines to allow me to make any appointments for him at this time.  He has been referred to Dr. Clyda Greener in the past for management of his diabetes, but when he arrives at the office, he is unable to tolerate waiting for his turn, and leaves.  He makes a vague commitment to me ' don't worry' - that he will go back to Dr. Bruna Potter, but I believe he is doing this to placate me.    The team is still working on goals of stabilizing his psychosis, and hopefully he will be more accepting and tolerant of medical goals and plans at that time.    Axis I:  Schizoaffective disorder  Diabetes Uncontrolled, Type II  Arterial Hypertension  P:  I have requested another Diabetes Nurse Consult.    I will add meal coverage at this time.  His HgbA1C is 6.7 so I do not want to add basal Lantus without further consultation.  Oral glyburide would further lower his glucose, but it is possible he is not eating as regularly at home as he is here on the unit.    Will discuss with the team.   I have removed the 'Syncope' from his problem list as this was a past medical problem that was evaluated by cardiology, but there is no evidence it is a current problem.

## 2011-02-04 NOTE — Treatment Plan (Signed)
Interdisciplinary Treatment Plan Update (Adult)  Date:  02/04/2011 Time Reviewed:  9:39 AM  Progress in Treatment: Attending groups: Yes. and As evidenced by:  documented in chart Participating in groups:  Yes. and As evidenced by:  when called on, responds appropriately Taking medication as prescribed:  Yes. and As evidenced by:  no refusals of meds Tolerating medication:  Yes. and As evidenced by:  no side effects reported or noted Family/Significant othe contact made:   Patient understands diagnosis:  No. and As evidenced by:  displays no insight into his condition, delusions, need to be in hospital Discussing patient identified problems/goals with staff:  Yes. and As evidenced by:  fully discusses his thoughts, problems Medical problems stabilized or resolved:  Yes. Denies suicidal/homicidal ideation: Yes. Issues/concerns per patient self-inventory:  No. Other:  New problem(s) identified: No, Describe:  no new symptoms noted, but patient does switch rapidly from calm, happy behavior to bizarre, irritable behavior throughout the day  Reason for Continuation of Hospitalization: Delusions  Hallucinations Medication stabilization  Interventions implemented related to continuation of hospitalization: Reality orientation, medication monitoring and adjustments, group therapy, psychoeducation  Additional comments:  Estimated length of stay:  3-5 days  Discharge Plan:  Will return home, F/U at [facility]):  None  Review of initial/current patient goals per problem list:   1.  Goal(s):  Reduce psychosis/delusions to baseline  Met:  No  Target date:  By Discharge   As evidenced by:  Delusions remain in force, appears to respond to internal stimuli at times  2.  Goal (s):  Learn 3 coping skills to deal with anger  Met:  No  Target date:  By Discharge   As evidenced by:  Does not acknowledge need to learn specific techniques from groups, too psychotic for staff to  separate out whether has learned actual skills  3.  Goal(s):  Increase willingness to comply with medications after discharge  Met:  No  Target date:  By Discharge   As evidenced by:  Still unable to verbalize need to stay on medications, keeps saying "I'm fine, I'm wonderful".  4.  Goal(s):  Needs medical follow-up  Met:  No  Target date:  By Discharge   As evidenced by:  Appointments to be put in place by NP  Attendees: Patient:  Did not attend 11/8/20129:39 AM  Family:   11/8/20129:39 AM  Physician:  Dr. Radonna Ricker 11/8/20129:39 AM  Nursing:   Izola Price, RN, BSN 11/8/20129:39 AM  Case Manager:  [name] LCSW 11/8/20129:39 AM  Counselor:  Veto Kemps, MT-BC 11/8/20129:39 AM  Other:  Wilmon Arms, counseling intern 11/8/20129:39 AM  Other:  Lorinda Creed, NP student 11/8/20129:39 AM  Other:   11/8/20129:39 AM  Other:   11/8/20129:39 AM   Scribe for Treatment Team:   [name], 02/04/2011, 9:39 AM

## 2011-02-04 NOTE — Progress Notes (Signed)
Inpatient Diabetes Program Recommendations  AACE/ADA: New Consensus Statement on Inpatient Glycemic Control (2009)  Target Ranges:  Prepandial:   less than 140 mg/dL      Peak postprandial:   less than 180 mg/dL (1-2 hours)      Critically ill patients:  140 - 180 mg/dL   Reason for Visit: Hyperglycemia  Inpatient Diabetes Program Recommendations Insulin - Basal: May benefit from Lantus 10 units QHS while here at [facility]. Insulin - Meal Coverage: Agree with meal coverage insulin Diet: Staff to encourage pt. to follow CHO-mod medium diet for better blood glucose control.  Note: CBGs up and down.  HgbA1C WNL. Will follow.

## 2011-02-04 NOTE — Progress Notes (Addendum)
Met with Patient in Aftercare Planning Group, but had to wake him up once, at which time he denied being asleep despite snoring.  Patient reported no Case Management needs today.  Well groomed as usual.  Pt's case was discussed in Treatment Team, see Plan Update for details.  Ambrose Mantle, LCSW 02/04/2011, 11:36 AM  Per State Regulation 482.30  This chart was reviewed for medical necessity with respect to the patient's Admission/Duration of stay.  Ambrose Mantle, LCSW  02/04/2011  Next Review Date  02/07/2011  Ambrose Mantle, LCSW 02/04/2011, 3:21 PM

## 2011-02-05 ENCOUNTER — Encounter (HOSPITAL_COMMUNITY): Payer: Self-pay | Admitting: Internal Medicine

## 2011-02-05 LAB — COMPREHENSIVE METABOLIC PANEL
Alkaline Phosphatase: 125 U/L — ABNORMAL HIGH (ref 39–117)
BUN: 18 mg/dL (ref 6–23)
Chloride: 83 mEq/L — ABNORMAL LOW (ref 96–112)
GFR calc Af Amer: 78 mL/min — ABNORMAL LOW (ref >90)
GFR calc non Af Amer: 67 mL/min — ABNORMAL LOW (ref >90)
Glucose, Bld: 205 mg/dL — ABNORMAL HIGH (ref 70–99)
Potassium: 4.5 mEq/L (ref 3.5–5.1)
Total Bilirubin: 0.3 mg/dL (ref 0.3–1.2)

## 2011-02-05 LAB — GLUCOSE, CAPILLARY: Glucose-Capillary: 207 mg/dL — ABNORMAL HIGH (ref 70–99)

## 2011-02-05 MED ORDER — AMLODIPINE BESYLATE 5 MG PO TABS
5.0000 mg | ORAL_TABLET | Freq: Every day | ORAL | Status: DC
  Administered 2011-02-05 – 2011-02-07 (×3): 5 mg via ORAL
  Filled 2011-02-05 (×4): qty 1

## 2011-02-05 MED ORDER — METFORMIN HCL 500 MG PO TABS
500.0000 mg | ORAL_TABLET | Freq: Two times a day (BID) | ORAL | Status: DC
  Administered 2011-02-05 – 2011-02-06 (×2): 500 mg via ORAL
  Filled 2011-02-05 (×4): qty 1

## 2011-02-05 MED ORDER — DIVALPROEX SODIUM ER 500 MG PO TB24
500.0000 mg | ORAL_TABLET | Freq: Every day | ORAL | Status: DC
  Administered 2011-02-05 – 2011-02-07 (×3): 500 mg via ORAL
  Filled 2011-02-05 (×4): qty 1

## 2011-02-05 NOTE — Progress Notes (Signed)
  Patient pleasant on approach today. Thinking disorganized when talking to him. Conversation has loose associations this morning and conversation hard to follow. Laughing and joking around with the staff but at times not making sense. Filled out self inventory with numbers and letters but no logical conversation on the paper. Denied feeling depressed. Denies SI/HI or a/v hallucinations. Took am meds without difficulty. Staff will continue to monitor and encourage group attendance.

## 2011-02-05 NOTE — Progress Notes (Signed)
Recreation Therapy Group Note  Date: 02/05/2011         Time: 1115      Group Topic/Focus: The focus of the group is on enhancing the patients' ability to cope with stressors by understanding what coping is, why it is important, the negative effects of stress and developing healthier coping skills. Patients practice [place] and discuss how exercise can be used as a healthy coping strategy.   Participation Level: Minimal  Participation Quality: Drowsy  Affect: Labile  Cognitive: Confused  Additional Comments: Patient sang the answers to any questions posed by the recreation therapist, then fell asleep and slept the remainder of group.   Marly Schuld 02/05/2011 1:29 PM

## 2011-02-05 NOTE — Discharge Summary (Signed)
Hospital Admission Note Date: 02/05/2011  Patient name: Mitchell [name] Medical record number: [id number] Date of birth: [birth year] Age: 50 y.o. Gender: male PCP: No primary provider on file.  Attending physician: Katheren Puller, MD  Reason for consult: Uncontrolled diabetes mellitus and hypertension.  History of Present Illness: This is a 50 year old with past medical history of diabetes and hypertension. Also past medical history of psychosis and chronic mental illness. We were consulted for uncontrolled diabetes mellitus type 2 and hypertension. The patient relates he has been a diabetic since 2002. But hasn't been diet controlled at home. Here in the hospital his hemoglobin A1c was 6.1 in 01/28/2011. He relates he's currently asymptomatic.   Allergies: Review of patient's allergies indicates no known allergies. Past Medical History  Diagnosis Date  . Hypertension 2000 as per Patient  . Diabetes mellitus 2000 as per Patient   Prior to Admission medications   Medication Sig Start Date End Date Taking? Authorizing Provider  benztropine (COGENTIN) 0.5 MG tablet Take 0.5 mg by mouth at bedtime.      Historical Provider, MD  clonazePAM (KLONOPIN) 1 MG tablet Take 1 mg by mouth 2 (two) times daily.      Historical Provider, MD  OLANZapine (ZYPREXA) 10 MG tablet Take 10 mg by mouth at bedtime.      Historical Provider, MD   History reviewed. No pertinent past surgical history. History reviewed. No pertinent family history. History   Social History  . Marital Status: Divorced    Spouse Name: N/A    Number of Children: N/A  . Years of Education: N/A   Occupational History  . Not on file.   Social History Main Topics  . Smoking status: Former Smoker    Types: Cigarettes  . Smokeless tobacco: Not on file  . Alcohol Use: No  . Drug Use: No  . Sexually Active: Yes   Other Topics Concern  . Not on file   Social History Narrative  . No narrative on file   Review of  Systems: Pertinent items are noted in HPI. Physical Exam: Filed Vitals:   02/04/11 2101 02/04/11 2222 02/05/11 0819 02/05/11 0820  BP: 191/98 191/107 190/95 177/107  Pulse:   111 114  Temp:   97.8 F (36.6 C)   TempSrc:   Oral   Resp: 18  18   Height:      Weight:       No intake or output data in the 24 hours ending 02/05/11 1555 BP 177/107  Pulse 114  Temp(Src) 97.8 F (36.6 C) (Oral)  Resp 18  Ht 5\' 4"  (1.626 m)  Wt 80.74 kg (178 lb)  BMI 30.55 kg/m2  General Appearance:    Alert, cooperative, no distress, appears stated age  Head:    Normocephalic, without obvious abnormality, atraumatic  Eyes:    PERRL, conjunctiva/corneas clear, EOM's intact, fundi    benign, both eyes       Ears:    Normal TM's and external ear canals, both ears  Nose:   Nares normal, septum midline, mucosa normal, no drainage    or sinus tenderness  Throat:   Lips, mucosa, and tongue normal; teeth and gums normal  Neck:   Supple, symmetrical, trachea midline, no adenopathy;       thyroid:  No enlargement/tenderness/nodules; no carotid   bruit or JVD  Back:     Symmetric, no curvature, ROM normal, no CVA tenderness  Lungs:     Clear to auscultation bilaterally, respirations  unlabored  Chest wall:    No tenderness or deformity  Heart:    Regular rate and rhythm, S1 and S2 normal, no murmur, rub   or gallop  Abdomen:     Soft, non-tender, bowel sounds active all four quadrants,    no masses, no organomegaly  Genitalia:   deferred    Rectal:     deferred   Extremities:   Extremities normal, atraumatic, no cyanosis or edema  Pulses:   2+ and symmetric all extremities  Skin:   Skin color, texture, turgor normal, no rashes or lesions  Lymph nodes:   Cervical, supraclavicular, and axillary nodes normal  Neurologic:   CNII-XII intact. Normal strength, sensation and reflexes      throughout   Lab results:  Nov. 1st 2012 Hbg 6.7 Cr. 0.8  Imaging results: None No results found. Other  results: EKG: Marland Kitchen   Patient Active Hospital Problem List: BIPOLAR AFFECTIVE DISORDER (05/12/2010)  per psychiatry attending.  Diabetes mellitus type II, uncontrolled (05/12/2010) Hemoglobin A1c 6.7. Due to to his mental  condition, will recommend starting metformin.  I talked to him about  insulin. He relates he doesn't want to take it. Some other medication that can be added as needed, we'll recommend something like glipizide or Januvia (preferable Januvia). At this time we'll start him on metformin and see how he responds. 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 We'll start him Norvasc appear IF he tolerates it. We'll also start lisinopril. Check a be met in a week. Check a basic metabolic panel now. Last creatinine in November 1 showed < 1.0. Continue Clonidine and HCTZ.  FELIZ ORTIZ, ABRAHAM 02/05/2011, 3:55 PM

## 2011-02-05 NOTE — Progress Notes (Signed)
Patient stood on the hall way at the beginning of this shift. Appeared delusional and paranoid; thought process disorganized, had rapid speech and seemed to be out of touch with reality. Believed he knows everything, that he is God and "Obama my son", Writer encouraged patient to stay in his room; asked patient if he needed anything. He requested for ginger ale. Writer gave patient a cup of ginger ale and he went inside his room.

## 2011-02-05 NOTE — Progress Notes (Signed)
  Mitchell [name] approaches me in the hall this morning with pressured speech - mildly grandiose - wanting me to refer to him as '[name]' (referring to a religious leader), rather than his false name of Mitchell [name].  His speech is hyperverbal, pressured.  He talks in a continuous stream and is unable to tolerate a reciprocal style of conversation.     Thinking is hyperreligious.  +ideas of reference, and positive for flight of ideas.  He believes that the receptionist at the front desk is allowing visitors in for the purpose of stealing from the patient's secured lockers.  Paranoid themes in his thinking.  No evidence of dangerous ideas or plans.  Has delusional content.  Does not appear to be hallucinating.    His blood sugar continues to elevate throughout the day to >300 mg% by HS.  See my note from yesterday.  Rhonda RN, Diabetes Coordinator has left a consult note, but given his elevated BP and blood sugar, I will ask Internal Medicine to see him and make recommendations.  I am not comfortable starting him on Lantus at this point.   He is compliant with medications here at [facility].    A: Schizoaffective Disorder, Bipolar Type, Manic  P:  Increase Depakote  Will ask internal medicine to see him for consult on BP and diabetes.

## 2011-02-05 NOTE — Discharge Planning (Signed)
Met with Patient in Aftercare Planning Group, where he was pleasant but complained loudly of perceived injustices against him at the hospital last night.  He states that [facility] staff should not tell him it is 2AM when it is 4AM, and attempts by Case Manager to work on ways he can verify correct time (by seeing clock in RN station or dayroom) were unsuccessful, as he remained fixated.  He stated that other patients are being allowed to get coffee and snacks in the middle of the night, and he should be allowed as well.  His list of perceived injustices continued for a few minutes, and he was quite agitated throughout.  Case Manager provided support and encouragement, and kept telling him we want him to be happy and comfortable, but also reiterated that the rules must be followed.  Patient then said he is tired of being called by "that fake name" which he identified as "Mitchell [name]".  He said his real name is Mitchell [name], but we can call him Mitchell [name].  Case Manager told him would make other staff aware of his preference.  During Aftercare Planning Group, Case Manager provided psychoeducation on "Suicide Prevention Information."  This included descriptions of risk factors for suicide, warning signs that an individual is in crisis and thinking of suicide, and what to do if this occurs.  Pt indicated understanding of information provided, and will read brochure given upon discharge.    Ambrose Mantle, LCSW 02/05/2011, 12:55 PM

## 2011-02-05 NOTE — Progress Notes (Signed)
[facility] Group Notes:  (Counselor/Nursing/MHT/Case Management/Adjunct)  02/05/2011 3:05 PM  Type of Therapy:  Group Therapy  Participation Level:  Active  Participation Quality:  Attentive  Affect:  Appropriate  Cognitive:  Delusional  Insight:  None  Engagement in Group:  Good  Engagement in Therapy:  Good  Modes of Intervention:  Education, Support and Exploration  Summary of Progress/Problems: Patient was more alert today and did not fall asleep. Stated that he is leaving Monday and going to get married at the court house. Would not disclose who this was except that he met her at [facility]   Nimsi Males, Aram Beecham 02/05/2011, 3:05 PM

## 2011-02-05 NOTE — Progress Notes (Signed)
  Patient went to group this evening dressed in a blazer. During karaoke this evening MHTs reported back to this nurse that patient became upset because he was not allowed to sing his third song. It was reported that patient started speaking off-topic during second song instead of singing. When patient came back to unit, he was upset. BP was taken and sitting was 199/107 with pulse 114 and standing was 191/98 with pulse 121. Nurse notified MD on call and was ordered to go ahead and give scheduled clonidine. CBG this evening was 321 and no coverage was ordered to be given. CBG was taken after snack was given. Patient states that he has had a great day today. But when speaking to patient he is religiously preoccupied and illogical. Denies SI/HI/AV but appears preoccupied.

## 2011-02-05 NOTE — Progress Notes (Signed)
Patient appeared agitated and very loud; Clinical research associate offered PRN ativan tabs for agitation and anxiety. Pt received medication. Reassessed patient at 9am; pt seemed less anxious but continued to show signs and symptoms of delusions and irritability; although he denied feeling any better; "I told you the medication won't do anything, it wasn't mine and it won't work". Q15 min check continues to maintain safety

## 2011-02-06 DIAGNOSIS — E871 Hypo-osmolality and hyponatremia: Secondary | ICD-10-CM | POA: Diagnosis present

## 2011-02-06 LAB — GLUCOSE, CAPILLARY
Glucose-Capillary: 247 mg/dL — ABNORMAL HIGH (ref 70–99)
Glucose-Capillary: 386 mg/dL — ABNORMAL HIGH (ref 70–99)
Glucose-Capillary: 168 mg/dL — ABNORMAL HIGH (ref 70–99)

## 2011-02-06 MED ORDER — METFORMIN HCL 500 MG PO TABS
1000.0000 mg | ORAL_TABLET | Freq: Two times a day (BID) | ORAL | Status: DC
  Administered 2011-02-06 – 2011-02-09 (×5): 1000 mg via ORAL
  Filled 2011-02-06: qty 2
  Filled 2011-02-06: qty 1
  Filled 2011-02-06 (×9): qty 2
  Filled 2011-02-06: qty 1
  Filled 2011-02-06 (×2): qty 2

## 2011-02-06 MED ORDER — CLONIDINE HCL 0.1 MG PO TABS
0.1000 mg | ORAL_TABLET | Freq: Two times a day (BID) | ORAL | Status: DC
  Administered 2011-02-06 – 2011-02-09 (×5): 0.1 mg via ORAL
  Filled 2011-02-06 (×9): qty 1

## 2011-02-06 NOTE — Progress Notes (Signed)
Nursing: Pt. continues to be emotionally labile and irritable when asked if he was S/H/I's, then states at med. pass, "no". Pt. Remains disorganized and has often-incoherent speech, containing word salad, flight-of-ideas, etc.; Pt. is pleasant most of the time, unless challenged with questions of his mental competence.  Pt. accepted his HS meds. and is cooperative with staff and peers.

## 2011-02-06 NOTE — Progress Notes (Signed)
Pt was up in dayroom upon first assessment and was given his self-inventory to fill out.  He wrote all over the paper and checked off every box on the paper.  He even took a peers paper and wrote all over hers too.  He did come to the medication window all took all his medications without incident.  He denies any issues but once you try to engage him he becomes almost incoherent in his speech.  He did say something about Jesus and Obama but couldn't make out what he was saying.  He is pacing at times in the hall but remains in the dayroom for the most part.  He did have some new orders today for his bp meds and metformin increased.

## 2011-02-06 NOTE — Progress Notes (Signed)
Subjective: No complains Objective: Filed Vitals:   02/05/11 2103 02/06/11 0630 02/06/11 0645 02/06/11 0804  BP: 129/60 160/104 171/99 160/104  Pulse: 118 100 97   Temp:  97.7 F (36.5 C)    TempSrc:  Oral    Resp:  20    Height:      Weight:       Weight change:  No intake or output data in the 24 hours ending 02/06/11 0810  General: Alert, awake, oriented x2, in no acute distress. Tangential in his speech HEENT: No bruits, no goiter.  Heart: Regular rate and rhythm, without murmurs, rubs, gallops.  Lungs: Crackles left side, bilateral air movement.  Abdomen: Soft, nontender, nondistended, positive bowel sounds.  Neuro: Grossly intact, nonfocal.   Lab Results:  [facility] 02/05/11 2004  NA 122*  K 4.5  CL 83*  CO2 31  GLUCOSE 205*  BUN 18  CREATININE 1.23  CALCIUM 10.1  MG --  PHOS --    Basename 02/05/11 2004  AST 20  ALT 25  ALKPHOS 125*  BILITOT 0.3  PROT 7.3  ALBUMIN 3.8   No results found for this basename: LIPASE:2,AMYLASE:2 in the last 72 hours No results found for this basename: WBC:2,NEUTROABS:2,HGB:2,HCT:2,MCV:2,PLT:2 in the last 72 hours No results found for this basename: CKTOTAL:3,CKMB:3,CKMBINDEX:3,TROPONINI:3 in the last 72 hours No results found for this basename: POCBNP:3 in the last 72 hours No results found for this basename: DDIMER:2 in the last 72 hours No results found for this basename: HGBA1C:2 in the last 72 hours No results found for this basename: CHOL:2,HDL:2,LDLCALC:2,TRIG:2,CHOLHDL:2,LDLDIRECT:2 in the last 72 hours No results found for this basename: TSH,T4TOTAL,FREET3,T3FREE,THYROIDAB in the last 72 hours No results found for this basename: VITAMINB12:2,FOLATE:2,FERRITIN:2,TIBC:2,IRON:2,RETICCTPCT:2 in the last 72 hours  Micro Results: No results found for this or any previous visit (from the past 240 hour(s)).  Studies/Results: No results found.  Medications: I have reviewed the patient's current medications.   Patient  Active Hospital Problem List: BIPOLAR AFFECTIVE DISORDER (05/12/2010) Per syq.  Diabetes mellitus type II, uncontrolled (05/12/2010) Increase metformin, monitor CBG.  SCHIZOAFFECTIVE DISORDER (05/12/2010)  Per syq.  Hypertension, uncontrolled (05/12/2010) Increase clonidine, d/c HCTZ 2/2 to hyponatremia. Increase norvasc to 5 mg. Monitor Creatinine.  Hyponatremia: D/c HCTZ, also a component of psuedohyponatremia 2/2 to High blood glucose. Encourage to drink of plenty of fluids.    LOS: 12 days   Mitchell [name], Mitchell [name] 02/06/2011, 8:10 AM

## 2011-02-06 NOTE — Progress Notes (Signed)
Has had a good evening, took all his meds w/incident, teated this nurse about new computer system and got a good laugh out of it, voicing no problems. q88min safety checks continue and support offered, patient remains safe

## 2011-02-06 NOTE — Progress Notes (Signed)
  Mitchell [name] is up and about. Talking to self. Calm and cooperative. Staff reports he is making sexually inappropriate comments to other male patients. He is sleeping and eating fine. Participating in groups. Neatly dressed.   Plan continue to monitor behavior and medication regime

## 2011-02-06 NOTE — Progress Notes (Signed)
[facility] Group Notes:  (Counselor/Nursing/MHT/Case Management/Adjunct)  02/06/2011 11:00 AM  Type of Therapy:  Counseling Group / Dance/Movement Therapy  Participation Level:  Minimal  Participation Quality:  Redirectable and Sharing  Affect:  Appropriate  Cognitive:  Delusional and Hallucinating  Insight:  Limited  Engagement in Group:  Limited  Engagement in Therapy:  Limited  Modes of Intervention:  Activity, Clarification, Socialization and Support  Summary of Progress/Problems:  Group discussed healthy and unhealthy ways they help themselves when they feel stuck. Pt. Left group and came back. Group focused on how Thanksgiving is in two weeks, and used this holiday as a model to focus on positives instead of negatives in their lives when feeling stuck. Pt. Shared that they are thankful for his wife today.   Collins, Rayni 02/06/2011, 3:03 PM

## 2011-02-06 NOTE — Progress Notes (Signed)
Pt.  attended and participated in aftercare planning group. Suicide prevention information, warning signs to look for with suicide and crisis line numbers to use.   Pt. Had no questions or concerns.

## 2011-02-07 LAB — GLUCOSE, CAPILLARY
Glucose-Capillary: 153 mg/dL — ABNORMAL HIGH (ref 70–99)
Glucose-Capillary: 200 mg/dL — ABNORMAL HIGH (ref 70–99)

## 2011-02-07 MED ORDER — GLIPIZIDE ER 5 MG PO TB24
5.0000 mg | ORAL_TABLET | Freq: Every day | ORAL | Status: DC
  Administered 2011-02-07 – 2011-02-08 (×2): 5 mg via ORAL
  Filled 2011-02-07 (×3): qty 1

## 2011-02-07 MED ORDER — LISINOPRIL 10 MG PO TABS
10.0000 mg | ORAL_TABLET | Freq: Every day | ORAL | Status: DC
  Administered 2011-02-07 – 2011-02-09 (×3): 10 mg via ORAL
  Filled 2011-02-07 (×6): qty 1

## 2011-02-07 MED ORDER — AMLODIPINE BESYLATE 10 MG PO TABS
10.0000 mg | ORAL_TABLET | Freq: Every day | ORAL | Status: DC
  Administered 2011-02-08 – 2011-02-09 (×2): 10 mg via ORAL
  Filled 2011-02-07 (×6): qty 1

## 2011-02-07 NOTE — Progress Notes (Signed)
[facility] Group Notes:  (Counselor/Nursing/MHT/Case Management/Adjunct)  02/07/2011 11:00 AM  Type of Therapy:  Counseling / Dance/Movement Therapy  Participation Level:  Did Not Attend  Mitchell [name] 02/07/2011, 11:36 AM    [facility] Group Notes:  (Counselor/Nursing/MHT/Case Management/Adjunct)  02/07/2011 11:00 AM  Type of Therapy:  Counseling / Dance/Movement Therapy  Participation Level:  Did Not Attend  Mitchell [name] 02/07/2011, 11:36 AM

## 2011-02-07 NOTE — Progress Notes (Addendum)
  Pt was isolative to his room this evening, when rounding, he was lying in bed, wrapped up in his covers, but awake.  Affect is flat, was heard talking to himself. Agreed to come out to med window for his meds, and afterward, he went into the dayroom and had a snack, minimal interaction with staff or peers.   Will continue to monitor. 04:00  Pt has been talking loudly at times, seems to be asleep currently, but mht reports that he had been up earlier, was naked and masturbating in the room, afterward asked for 4 bottles of body wash. She reports she gave him 2 bottles and told him to go to bed.  He was in the bathroom for a short time and went back to bed.

## 2011-02-07 NOTE — Progress Notes (Signed)
Subjective: No complains. Still with tangential conversation Objective: Filed Vitals:   02/05/11 2103 02/06/11 0630 02/06/11 0645 02/06/11 0804  BP: 129/60 160/104 171/99 160/104  Pulse: 118 100 97   Temp:  97.7 F (36.5 C)    TempSrc:  Oral    Resp:  20    Height:      Weight:       Weight change:  No intake or output data in the 24 hours ending 02/07/11 0803  General: Alert, awake, oriented x2, in no acute distress. Tangential in his speech HEENT: No bruits, no goiter.  Heart: Regular rate and rhythm, without murmurs, rubs, gallops.  Lungs: Crackles left side, bilateral air movement.  Abdomen: Soft, nontender, nondistended, positive bowel sounds.  Neuro: Grossly intact, nonfocal.   Lab Results:  [facility] 02/05/11 2004  NA 122*  K 4.5  CL 83*  CO2 31  GLUCOSE 205*  BUN 18  CREATININE 1.23  CALCIUM 10.1  MG --  PHOS --    Basename 02/05/11 2004  AST 20  ALT 25  ALKPHOS 125*  BILITOT 0.3  PROT 7.3  ALBUMIN 3.8   No results found for this basename: LIPASE:2,AMYLASE:2 in the last 72 hours No results found for this basename: WBC:2,NEUTROABS:2,HGB:2,HCT:2,MCV:2,PLT:2 in the last 72 hours No results found for this basename: CKTOTAL:3,CKMB:3,CKMBINDEX:3,TROPONINI:3 in the last 72 hours No results found for this basename: POCBNP:3 in the last 72 hours No results found for this basename: DDIMER:2 in the last 72 hours No results found for this basename: HGBA1C:2 in the last 72 hours No results found for this basename: CHOL:2,HDL:2,LDLCALC:2,TRIG:2,CHOLHDL:2,LDLDIRECT:2 in the last 72 hours No results found for this basename: TSH,T4TOTAL,FREET3,T3FREE,THYROIDAB in the last 72 hours No results found for this basename: VITAMINB12:2,FOLATE:2,FERRITIN:2,TIBC:2,IRON:2,RETICCTPCT:2 in the last 72 hours  Micro Results: No results found for this or any previous visit (from the past 240 hour(s)).  Studies/Results: No results found.  Medications: I have reviewed the  patient's current medications.   Patient Active Hospital Problem List: BIPOLAR AFFECTIVE DISORDER (05/12/2010) Per syq.  Diabetes mellitus type II, uncontrolled (05/12/2010) Increase metformin, monitor CBG. Add glipizide  SCHIZOAFFECTIVE DISORDER (05/12/2010)  Per syq.  Hypertension, uncontrolled (05/12/2010) Increase clonidine. Increase norvasc to 10mg . Monitor Creatinine. Add lisinopril. Check a basic metabolic panel.  Hyponatremia: b-met aml.    LOS: 13 days   Mitchell [name], Anselma Herbel 02/07/2011, 8:03 AM

## 2011-02-07 NOTE — Progress Notes (Signed)
  Mitchell [name] has been out in the milieu.Marland KitchenNo Known Allergies day. His affect is guarded, paranoid, suspicious and he has loose associations of thought pattern, focusing on delusions of granduer, paranoid thinking and tangential thinking. He avoids eye contact. He can be intrusive and has a lbile affect, escalating to agitation if he doesn't understand what is being said to him. He has been compliant throughout the shift, taking all meds that are given to him, as well as taking his insulin as ordered by the MD. He was not able to complete his self inventory sheet, but did verbally contract with this nurse for safety, stating he did not want to hurt himself and / or anybody else. A He attended one group  This AM. R Safety is maintained with therapeutic relationship intact. Will cont to medicate, assess [name]'s response to meds and adjust according to MD order. PD RN [facility]

## 2011-02-07 NOTE — Progress Notes (Signed)
  Elma was seen and is currently in bed, eyes closed. Easily awakened. When called by his first name, seemed irritable and asked to be called " [name]". Denied any c/o when asked about any suicidal thinking, he stated " goes against his nature". Internal Medicine consulted this am and made changes to his medication regime. Sodium at 122. BMET scheduled for the am.  Plan: follow Internal Medicine recommendations. Check blood sugars as indicated. Continue to monitor behavior.

## 2011-02-08 ENCOUNTER — Inpatient Hospital Stay (HOSPITAL_COMMUNITY): Payer: Medicare Other

## 2011-02-08 ENCOUNTER — Other Ambulatory Visit: Payer: Self-pay

## 2011-02-08 ENCOUNTER — Encounter (HOSPITAL_COMMUNITY): Payer: Self-pay | Admitting: *Deleted

## 2011-02-08 DIAGNOSIS — F259 Schizoaffective disorder, unspecified: Principal | ICD-10-CM

## 2011-02-08 LAB — GLUCOSE, CAPILLARY
Glucose-Capillary: 155 mg/dL — ABNORMAL HIGH (ref 70–99)
Glucose-Capillary: 153 mg/dL — ABNORMAL HIGH (ref 70–99)
Glucose-Capillary: 149 mg/dL — ABNORMAL HIGH (ref 70–99)
Glucose-Capillary: 274 mg/dL — ABNORMAL HIGH (ref 70–99)

## 2011-02-08 LAB — URINALYSIS, ROUTINE W REFLEX MICROSCOPIC
Hgb urine dipstick: NEGATIVE
Nitrite: NEGATIVE
Specific Gravity, Urine: 1.007 (ref 1.005–1.030)
Urobilinogen, UA: 0.2 mg/dL (ref 0.0–1.0)

## 2011-02-08 LAB — DIFFERENTIAL
Lymphocytes Relative: 23 % (ref 12–46)
Lymphs Abs: 1.8 10*3/uL (ref 0.7–4.0)
Neutrophils Relative %: 66 % (ref 43–77)

## 2011-02-08 LAB — BASIC METABOLIC PANEL
CO2: 31 mEq/L (ref 19–32)
CO2: 28 mEq/L (ref 19–32)
Chloride: 82 mEq/L — ABNORMAL LOW (ref 96–112)
Glucose, Bld: 137 mg/dL — ABNORMAL HIGH (ref 70–99)
Glucose, Bld: 152 mg/dL — ABNORMAL HIGH (ref 70–99)
Potassium: 5.2 mEq/L — ABNORMAL HIGH (ref 3.5–5.1)
Potassium: 4.5 mEq/L (ref 3.5–5.1)
Sodium: 119 mEq/L — CL (ref 135–145)
Sodium: 118 mEq/L — CL (ref 135–145)

## 2011-02-08 LAB — POCT I-STAT TROPONIN I

## 2011-02-08 LAB — CBC
MCV: 89.4 fL (ref 78.0–100.0)
Platelets: 169 10*3/uL (ref 150–400)
RBC: 4.05 MIL/uL — ABNORMAL LOW (ref 4.22–5.81)
WBC: 7.9 10*3/uL (ref 4.0–10.5)

## 2011-02-08 MED ORDER — RISPERIDONE 2 MG PO TABS
3.0000 mg | ORAL_TABLET | Freq: Two times a day (BID) | ORAL | Status: DC
  Administered 2011-02-09: 3 mg via ORAL
  Filled 2011-02-08 (×5): qty 1

## 2011-02-08 MED ORDER — GLIPIZIDE ER 5 MG PO TB24
5.0000 mg | ORAL_TABLET | Freq: Every day | ORAL | Status: DC
  Administered 2011-02-09: 5 mg via ORAL
  Filled 2011-02-08 (×5): qty 1

## 2011-02-08 MED ORDER — SODIUM CHLORIDE 0.9 % IV SOLN
INTRAVENOUS | Status: DC
  Administered 2011-02-08: 19:00:00 via INTRAVENOUS

## 2011-02-08 MED ORDER — INSULIN ASPART 100 UNIT/ML ~~LOC~~ SOLN
0.0000 [IU] | Freq: Three times a day (TID) | SUBCUTANEOUS | Status: DC
  Administered 2011-02-08: 3 [IU] via SUBCUTANEOUS
  Filled 2011-02-08: qty 3

## 2011-02-08 NOTE — Progress Notes (Signed)
Pt was up in the hall upon first assessment this morning and was given his self-inventory to fill out.  He is still writing all over the paper and checking off all the boxes.  Pt continues to be labile, agitated and hostile at times.  He is incohernet when trying to talk at times.  Lab called at 0810 with a critical sodium level of 119.  Informed Dr. Rogers Blocker and he made some medication changes and was going to have someone from Internal Medicine come over.   Just now went to ask Landry Corporal NP if she knew what was going on if someone was coming to see him or not.  She called Dr. Rogers Blocker and he wants him sent out to ed by 911.  Liborio Nixon plans to call ed while this nurse calls 911.

## 2011-02-08 NOTE — Progress Notes (Signed)
  Spent early part of the evening in his room, but did come out to the dayroom for a snack and came to the med window for his hs meds.  Had poor eye contact, gave brief, one or two word answers and seemed somewhat irritable.  Will continue to monitor.

## 2011-02-08 NOTE — H&P (Signed)
PCP:   None patient was sent in from behavioral health.   Chief Complaint: Patient was admitted to behavioral health for paranoia and bipolar disorder, and sent in the emergency room for evaluation of progressive hyponatremia.  HPI: Mitchell [name] is an 50 y.o. male with history of schizoaffective disorder, bipolar affective disorder, diabetes, and prior history of hyponatremia, sent in by his psychiatrist because he has progressive hyponatremia with a serum sodium of 136 only one week ago, and today it was 118.   He does admit to have excessive thirst and has been drinking quite a bit of water.  He denied taking any diuretic or drinking any alcohol over at the behavior health center.  He has been on Depakote and this medication was discontinued by his psychiatrist.  He denied any weakness, nausea, vomiting, headache, or any other symptomology.     Past Medical History  Diagnosis Date  . Hypertension   . Diabetes mellitus   . Mental disorder   . Bipolar affective disorder   . Schizoaffective disorder     History reviewed. No pertinent past surgical history.  Medications:  HOME MEDS: Prior to Admission medications   Not on File    PRIOR TO AMDISSION MEDS Medications Prior to Admission  Medication Dose Route Frequency Provider Last Rate Last Dose  . acetaminophen (TYLENOL) tablet 650 mg  650 mg Oral Q6H PRN Charyl Dancer, PHARMD      . alum & mag hydroxide-simeth (MAALOX/MYLANTA) 200-200-20 MG/5ML suspension 30 mL  30 mL Oral Q4H PRN Charyl Dancer, PHARMD      . amLODipine (NORVASC) tablet 10 mg  10 mg Oral Daily Marinda Elk, MD   10 mg at 02/08/11 0826  . aspirin chewable tablet 81 mg  81 mg Oral Daily Charyl Dancer, PHARMD   81 mg at 02/08/11 0825  . cloNIDine (CATAPRES) tablet 0.1 mg  0.1 mg Oral BID Marinda Elk, MD   0.1 mg at 02/08/11 0826  . diphenhydrAMINE (BENADRYL) capsule 50 mg  50 mg Oral QHS PRN Charyl Dancer, PHARMD   50 mg at 02/04/11 2222    . glipiZIDE (GLUCOTROL XL) 24 hr tablet 5 mg  5 mg Oral QAC breakfast Charyl Dancer, PHARMD      . insulin aspart (novoLOG) injection 0-15 Units  0-15 Units Subcutaneous TID [facility] Charyl Dancer, [place]   3 Units at 02/08/11 1152  . insulin aspart (novoLOG) injection 3 Units  3 Units Subcutaneous TID WC Viviann Spare, NP   3 Units at 02/08/11 1153  . lisinopril (PRINIVIL,ZESTRIL) tablet 10 mg  10 mg Oral Daily Marinda Elk, MD   10 mg at 02/08/11 0826  . LORazepam (ATIVAN) tablet 2 mg  2 mg Oral Q6H PRN Charyl Dancer, PHARMD   2 mg at 02/05/11 0405  . LORazepam (ATIVAN) tablet 2 mg  2 mg Oral Once Viviann Spare, NP   2 mg at 02/03/11 0334  . magnesium hydroxide (MILK OF MAGNESIA) suspension 30 mL  30 mL Oral Daily PRN Charyl Dancer, PHARMD      . metFORMIN (GLUCOPHAGE) tablet 1,000 mg  1,000 mg Oral BID WC Marinda Elk, MD   1,000 mg at 02/08/11 0825  . risperiDONE (RISPERDAL) tablet 2 mg  2 mg Oral Once Viviann Spare, NP   2 mg at 02/03/11 0335  . risperiDONE (RISPERDAL) tablet 3 mg  3 mg Oral BID Katheren Puller, MD      .  DISCONTD: 0.9 %  sodium chloride infusion   Intravenous Continuous Laray Anger, DO 75 mL/hr at 02/08/11 1910    . DISCONTD: amLODipine (NORVASC) tablet 5 mg  5 mg Oral Daily Marinda Elk, MD   5 mg at 02/07/11 0800  . DISCONTD: cloNIDine (CATAPRES) tablet 0.1 mg  0.1 mg Oral BH-qamhs Charyl Dancer, PHARMD   0.1 mg at 02/06/11 0805  . DISCONTD: divalproex (DEPAKOTE ER) 24 hr tablet 500 mg  500 mg Oral QHS Viviann Spare, NP   500 mg at 02/07/11 2304  . DISCONTD: divalproex (DEPAKOTE ER) 24 hr tablet 750 mg  750 mg Oral Daily Katheren Puller, MD   750 mg at 02/08/11 0826  . DISCONTD: glipiZIDE (GLUCOTROL XL) 24 hr tablet 5 mg  5 mg Oral Q breakfast Marinda Elk, MD   5 mg at 02/08/11 0825  . DISCONTD: hydrochlorothiazide (HYDRODIURIL) tablet 25 mg  25 mg Oral Daily Charyl Dancer, PHARMD   25 mg at  02/06/11 0804  . DISCONTD: insulin aspart (novoLOG) injection 0-15 Units  0-15 Units Subcutaneous TID [facility] Charyl Dancer, PHARMD   8 Units at 02/02/11 1259  . DISCONTD: insulin aspart (novoLOG) injection 0-15 Units  0-15 Units Subcutaneous TID [facility] Charyl Dancer, [place]   3 Units at 02/04/11 [phone]  . DISCONTD: insulin aspart (novoLOG) injection 0-15 Units  0-15 Units Subcutaneous TID WC Viviann Spare, NP   3 Units at 02/08/11 0734  . DISCONTD: metFORMIN (GLUCOPHAGE) tablet 500 mg  500 mg Oral BID WC Marinda Elk, MD   500 mg at 02/06/11 0804  . DISCONTD: risperiDONE (RISPERDAL) tablet 2 mg  2 mg Oral BID Katheren Puller, MD   2 mg at 02/08/11 0830  . DISCONTD: ziprasidone (GEODON) capsule 80 mg  80 mg Oral BID WC Charyl Dancer, PHARMD   80 mg at 02/02/11 9604   No current outpatient prescriptions on file as of 01/25/2011.    Allergies:  No Known Allergies  Social History:   reports that he has quit smoking. His smoking use included Cigarettes. He has never used smokeless tobacco. He reports that he does not drink alcohol or use illicit drugs.  Family History: History reviewed. No pertinent family history.  Rewiew of Systems:  The patient denies anorexia, fever, weight loss,, vision loss, decreased hearing, hoarseness, chest pain, syncope, dyspnea on exertion, peripheral edema, balance deficits, hemoptysis, abdominal pain, melena, hematochezia, severe indigestion/heartburn, hematuria, incontinence, genital sores, muscle weakness, suspicious skin lesions, transient blindness, difficulty walking, depression, unusual weight change, abnormal bleeding, enlarged lymph nodes, angioedema, and breast masses.   Physical Exam: Filed Vitals:   02/08/11 0901 02/08/11 1715 02/08/11 1717 02/08/11 1752  BP: 136/83 132/80 145/83 154/90  Pulse: 96 94 96 88  Temp:  97.9 F (36.6 C)  98.4 F (36.9 C)  TempSrc:  Oral  Oral  Resp:  20  20  Height:      Weight:      SpO2:  100%  97%    Blood pressure 154/90, pulse 88, temperature 98.4 F (36.9 C), temperature source Oral, resp. rate 20, height 5\' 4"  (1.626 m), weight 80.74 kg (178 lb), SpO2 97.00%.  VWU:[id number] person lying in the stretcher in no acute distress; cooperative with exam PSYCH: He is a little confused, but answered questions appropriately.  He doesn't know where he is, but he thought he was at [facility]. He does not appear anxious does not appear depressed;  affect is normal HEENT: Mucous membranes pink and anicteric; PERRLA; EOM intact; no cervical lymphadenopathy nor thyromegaly or carotid bruit; no JVD; Breasts:: Not examined CHEST WALL: No tenderness CHEST: Normal respiration, clear to auscultation bilaterally HEART: Regular rate and rhythm; no murmurs rubs or gallops BACK: No kyphosis or scoliosis; no CVA tenderness ABDOMEN: Obese, soft non-tender; no masses, no organomegaly, normal abdominal bowel sounds; no pannus; no intertriginous candida. Rectal Exam: Not done EXTREMITIES: No bone or joint deformity; age-appropriate arthropathy of the hands and knees; no edema; no ulcerations. Genitalia: not examined PULSES: 2+ and symmetric SKIN: Normal hydration no rash or ulceration CNS: Cranial nerves 2-12 grossly intact no focal neurologic deficit   Labs & Imaging Results for orders placed during the hospital encounter of 01/25/11 (from the past 48 hour(s))  GLUCOSE, CAPILLARY     Status: Abnormal   Collection Time   02/07/11  6:04 AM      Component Value Range Comment   Glucose-Capillary 170 (*) 70 - 99 (mg/dL)    Comment 1 Notify RN     GLUCOSE, CAPILLARY     Status: Abnormal   Collection Time   02/07/11 11:59 AM      Component Value Range Comment   Glucose-Capillary 159 (*) 70 - 99 (mg/dL)    Comment 1 Notify RN     GLUCOSE, CAPILLARY     Status: Abnormal   Collection Time   02/07/11  5:07 PM      Component Value Range Comment   Glucose-Capillary 153 (*) 70 - 99 (mg/dL)    Comment 1 Notify  RN     GLUCOSE, CAPILLARY     Status: Abnormal   Collection Time   02/07/11  9:36 PM      Component Value Range Comment   Glucose-Capillary 200 (*) 70 - 99 (mg/dL)    Comment 1 Notify RN      Comment 2 Documented in Chart     BASIC METABOLIC PANEL     Status: Abnormal   Collection Time   02/08/11  6:10 AM      Component Value Range Comment   Sodium 119 (*) 135 - 145 (mEq/L)    Potassium 5.2 (*) 3.5 - 5.1 (mEq/L) NO VISIBLE HEMOLYSIS   Chloride 83 (*) 96 - 112 (mEq/L)    CO2 31  19 - 32 (mEq/L)    Glucose, Bld 137 (*) 70 - 99 (mg/dL)    BUN 11  6 - 23 (mg/dL)    Creatinine, Ser 1.61  0.50 - 1.35 (mg/dL)    Calcium 9.0  8.4 - 10.5 (mg/dL)    GFR calc non Af Amer >90  >90 (mL/min)    GFR calc Af Amer >90  >90 (mL/min)   GLUCOSE, CAPILLARY     Status: Abnormal   Collection Time   02/08/11  6:11 AM      Component Value Range Comment   Glucose-Capillary 155 (*) 70 - 99 (mg/dL)   GLUCOSE, CAPILLARY     Status: Abnormal   Collection Time   02/08/11 11:46 AM      Component Value Range Comment   Glucose-Capillary 153 (*) 70 - 99 (mg/dL)    Comment 1 Notify RN     GLUCOSE, CAPILLARY     Status: Abnormal   Collection Time   02/08/11  5:06 PM      Component Value Range Comment   Glucose-Capillary 149 (*) 70 - 99 (mg/dL)    Comment 1 Notify RN  CBC     Status: Abnormal   Collection Time   02/08/11  6:35 PM      Component Value Range Comment   WBC 7.9  4.0 - 10.5 (K/uL)    RBC 4.05 (*) 4.22 - 5.81 (MIL/uL)    Hemoglobin 12.8 (*) 13.0 - 17.0 (g/dL)    HCT 16.1 (*) 09.6 - 52.0 (%)    MCV 89.4  78.0 - 100.0 (fL)    MCH 31.6  26.0 - 34.0 (pg)    MCHC 35.4  30.0 - 36.0 (g/dL)    RDW 04.5  40.9 - 81.1 (%)    Platelets 169  150 - 400 (K/uL)   DIFFERENTIAL     Status: Normal   Collection Time   02/08/11  6:35 PM      Component Value Range Comment   Neutrophils Relative 66  43 - 77 (%)    Neutro Abs 5.2  1.7 - 7.7 (K/uL)    Lymphocytes Relative 23  12 - 46 (%)    Lymphs Abs 1.8   0.7 - 4.0 (K/uL)    Monocytes Relative 10  3 - 12 (%)    Monocytes Absolute 0.8  0.1 - 1.0 (K/uL)    Eosinophils Relative 2  0 - 5 (%)    Eosinophils Absolute 0.1  0.0 - 0.7 (K/uL)    Basophils Relative 0  0 - 1 (%)    Basophils Absolute 0.0  0.0 - 0.1 (K/uL)   BASIC METABOLIC PANEL     Status: Abnormal   Collection Time   02/08/11  6:35 PM      Component Value Range Comment   Sodium 118 (*) 135 - 145 (mEq/L)    Potassium 4.5  3.5 - 5.1 (mEq/L)    Chloride 82 (*) 96 - 112 (mEq/L)    CO2 28  19 - 32 (mEq/L)    Glucose, Bld 152 (*) 70 - 99 (mg/dL)    BUN 12  6 - 23 (mg/dL)    Creatinine, Ser 9.14  0.50 - 1.35 (mg/dL)    Calcium 8.7  8.4 - 10.5 (mg/dL)    GFR calc non Af Amer >90  >90 (mL/min)    GFR calc Af Amer >90  >90 (mL/min)   URINALYSIS, ROUTINE W REFLEX MICROSCOPIC     Status: Normal   Collection Time   02/08/11  6:37 PM      Component Value Range Comment   Color, Urine YELLOW  YELLOW     Appearance CLEAR  CLEAR     Specific Gravity, Urine 1.007  1.005 - 1.030     pH 7.5  5.0 - 8.0     Glucose, UA NEGATIVE  NEGATIVE (mg/dL)    Hgb urine dipstick NEGATIVE  NEGATIVE     Bilirubin Urine NEGATIVE  NEGATIVE     Ketones, ur NEGATIVE  NEGATIVE (mg/dL)    Protein, ur NEGATIVE  NEGATIVE (mg/dL)    Urobilinogen, UA 0.2  0.0 - 1.0 (mg/dL)    Nitrite NEGATIVE  NEGATIVE     Leukocytes, UA NEGATIVE  NEGATIVE  MICROSCOPIC NOT DONE ON URINES WITH NEGATIVE PROTEIN, BLOOD, LEUKOCYTES, NITRITE, OR GLUCOSE <1000 mg/dL.  POCT I-STAT TROPONIN I     Status: Normal   Collection Time   02/08/11  6:40 PM      Component Value Range Comment   Troponin i, poc 0.00  0.00 - 0.08 (ng/mL)    Comment 3  GLUCOSE, CAPILLARY     Status: Abnormal   Collection Time   02/08/11 10:56 PM      Component Value Range Comment   Glucose-Capillary 274 (*) 70 - 99 (mg/dL)    Dg Chest 2 View  16/12/9602  *RADIOLOGY REPORT*  Clinical Data: Hypertension.  Cough.  CHEST - 2 VIEW  Comparison: 04/29/2010.   Findings: The heart remains normal in size with a mild increase in size.  Interval diffuse peribronchial thickening and accentuation of the interstitial markings.  Interval mild prominence of the pulmonary vasculature.  No pleural fluid.  Unremarkable bones.  IMPRESSION:  1.  Interval mild pulmonary vascular congestion, mild bronchitic changes and possible mild interstitial pulmonary edema. 2.  Normal sized heart with a mild increase in size.  Original Report Authenticated By: Darrol Angel, M.D.   Ct Head Wo Contrast  02/08/2011  *RADIOLOGY REPORT*  Clinical Data: 50 year old male with hypertension, low sodium.  CT HEAD WITHOUT CONTRAST  Technique:  Contiguous axial images were obtained from the base of the skull through the vertex without contrast.  Comparison: 04/29/2010.  Findings: Visualized paranasal sinuses and mastoids are clear. Visualized orbits and scalp soft tissues are within normal limits. No acute osseous abnormality identified.  Cerebral volume is within normal limits for age.  No midline shift, ventriculomegaly, mass effect, evidence of mass lesion, intracranial hemorrhage or evidence of cortically based acute infarction.  Gray-white matter differentiation is within normal limits throughout the brain.  Stable appearance of the brain stem. No suspicious intracranial vascular hyperdensity.  IMPRESSION: Stable, normal noncontrast CT appearance of the brain.  Original Report Authenticated By: Harley Hallmark, M.D.      Assessment Present on Admission:  .SCHIZOAFFECTIVE DISORDER .Orthostatic hypotension .Hypertension, uncontrolled .SYNCOPE .Diabetes mellitus type II, uncontrolled .BIPOLAR AFFECTIVE DISORDER .Hyponatremia   PLAN:   This patient presents with hyponatremia progressive over one week.   Aside from the Depakote, I do not clearly see any other medication that would cause hyponatremia.   He'll start urine specific gravity is 1.007 which is appropriately diluted.  I will get  a urine osmolality to compare with the calculated serum osmolality.  We'll also obtain a spot urine sodium.  With respect to treatment,  I will restrict his fluid to 1200 cc of free water daily and start him on normal saline.   For his diabetes, will resume all his medications and check blood glucose 4 times daily. He is otherwise stable and I suspect his serum sodium will improve. Other plans as per orders.    Airiana Elman 02/08/2011, 11:00 PM

## 2011-02-08 NOTE — Discharge Planning (Addendum)
Patient initially did not want to attend Aftercare Planning Group but did finally come, slamming the door behind himself when he entered, but denying that he had any anger.  He stated he had no Case Management needs and only wants to discharge.  Case Manager reminded him that we really need to talk to someone in his life, and he once again started speaking rapidly and CM did not understand other than that he has a place to go and needs a bus pass.  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    Ambrose Mantle, LCSW 02/08/2011, 1:55 PM  Per State Regulation 482.30  This chart was reviewed for medical necessity with respect to the patient's Admission/Duration of stay.  Ambrose Mantle, LCSW  02/08/2011   Next review date  02/11/11

## 2011-02-08 NOTE — ED Notes (Signed)
Pt requesting food....[place] sandwich, fruit cup, and drink given.

## 2011-02-08 NOTE — ED Notes (Signed)
JYN:[id number]> Expected date:02/08/11<BR> Expected time: 5:30 PM<BR> Means of arrival:Ambulance<BR> Comments:<BR> EMS 40 GC - chestwall pain/behavioral

## 2011-02-08 NOTE — Progress Notes (Signed)
Patient is still medically guarded paranoid disorganized unable to make a logical conversation. Patient's sodium level has decreased to 119 i will discontinue patient's Depakote at this time as patient's can have hyponatremia because of  Depakote. I also told the nurse practitioner to call  doctor Robb Matar who is was following the patient medically.

## 2011-02-08 NOTE — ED Provider Notes (Signed)
History     CSN: [id number] Arrival date & time: 01/25/2011 11:19 PM     Chief Complaint  Patient presents with  . Hypertension    sodium low    The history is provided by the patient, a caregiver and medical records. History Limited By: psychosis.   Pt was seen at 1815.  Per previous chart at [facility] and EMS report, pt with gradually decreasing sodium for the past several weeks.  Pt's psych meds that were the possible cause were discontinued, but pt's Na continued to decrease.  [facility] staff is also concerned about pt's HTN.  Pt himself currently denies any complaints.  No reported seizures or AMS from his baseline hx psychosis.     Past Medical History  Diagnosis Date  . Hypertension   . Diabetes mellitus     History reviewed. No pertinent past surgical history.   History  Substance Use Topics  . Smoking status: Former Smoker    Types: Cigarettes  . Smokeless tobacco: Not on file  . Alcohol Use: No     Review of Systems  Unable to perform ROS: Psychiatric disorder    Allergies  Review of patient's allergies indicates no known allergies.  Home Medications  No current outpatient prescriptions on file.  BP 154/90  Pulse 88  Temp(Src) 98.4 F (36.9 C) (Oral)  Resp 20  Ht 5\' 4"  (1.626 m)  Wt 178 lb (80.74 kg)  BMI 30.55 kg/m2  SpO2 97%  Physical Exam 1820: Physical examination:  Nursing notes reviewed; Vital signs and O2 SAT reviewed;  Constitutional: Well developed, Well nourished, Well hydrated, In no acute distress; Head:  Normocephalic, atraumatic; Eyes: EOMI, PERRL, No scleral icterus; ENMT: Mouth and pharynx normal, Mucous membranes moist; Neck: Supple, Full range of motion, No lymphadenopathy; Cardiovascular: Regular rate and rhythm, No murmur, rub, or gallop; Respiratory: Breath sounds clear & equal bilaterally, No rales, rhonchi, wheezes, or rub, Normal respiratory effort/excursion; Chest: Nontender, Movement normal; Abdomen: Soft, Nontender, Nondistended,  Normal bowel sounds; Extremities: Pulses normal, No tenderness, No edema, No calf edema or asymmetry.; Neuro: Awake/alert, mildly confused re: events.  Major CN grossly intact. Speech clear, no facial droop. Moves all ext spontaneously on stretcher without apparent gross focal motor deficits.; Skin: Color normal, Warm, Dry, no rash.    ED Course  Procedures   MDM  MDM Reviewed: nursing note and vitals Reviewed previous: ECG Interpretation: ECG, labs, CT scan and x-ray    Date: 02/08/2011  Rate: 95  Rhythm: normal sinus rhythm  QRS Axis: normal  Intervals: normal  ST/T Wave abnormalities: normal  Conduction Disutrbances:none  Narrative Interpretation:   Old EKG Reviewed: unchanged, no significant changes from previous EKG dated 01/27/2011.  URINALYSIS, ROUTINE W REFLEX MICROSCOPIC      Component Value Range   Color, Urine YELLOW  YELLOW    Appearance CLEAR  CLEAR    Specific Gravity, Urine 1.007  1.005 - 1.030    pH 7.5  5.0 - 8.0    Glucose, UA NEGATIVE  NEGATIVE (mg/dL)   Hgb urine dipstick NEGATIVE  NEGATIVE    Bilirubin Urine NEGATIVE  NEGATIVE    Ketones, ur NEGATIVE  NEGATIVE (mg/dL)   Protein, ur NEGATIVE  NEGATIVE (mg/dL)   Urobilinogen, UA 0.2  0.0 - 1.0 (mg/dL)   Nitrite NEGATIVE  NEGATIVE    Leukocytes, UA NEGATIVE  NEGATIVE   CBC      Component Value Range   WBC 7.9  4.0 - 10.5 (K/uL)   RBC  4.05 (*) 4.22 - 5.81 (MIL/uL)   Hemoglobin 12.8 (*) 13.0 - 17.0 (g/dL)   HCT 16.1 (*) 09.6 - 52.0 (%)   MCV 89.4  78.0 - 100.0 (fL)   MCH 31.6  26.0 - 34.0 (pg)   MCHC 35.4  30.0 - 36.0 (g/dL)   RDW 04.5  40.9 - 81.1 (%)   Platelets 169  150 - 400 (K/uL)  DIFFERENTIAL      Component Value Range   Neutrophils Relative 66  43 - 77 (%)   Neutro Abs 5.2  1.7 - 7.7 (K/uL)   Lymphocytes Relative 23  12 - 46 (%)   Lymphs Abs 1.8  0.7 - 4.0 (K/uL)   Monocytes Relative 10  3 - 12 (%)   Monocytes Absolute 0.8  0.1 - 1.0 (K/uL)   Eosinophils Relative 2  0 - 5 (%)    Eosinophils Absolute 0.1  0.0 - 0.7 (K/uL)   Basophils Relative 0  0 - 1 (%)   Basophils Absolute 0.0  0.0 - 0.1 (K/uL)  BASIC METABOLIC PANEL      Component Value Range   Sodium 118 (*) 135 - 145 (mEq/L)   Potassium 4.5  3.5 - 5.1 (mEq/L)   Chloride 82 (*) 96 - 112 (mEq/L)   CO2 28  19 - 32 (mEq/L)   Glucose, Bld 152 (*) 70 - 99 (mg/dL)   BUN 12  6 - 23 (mg/dL)   Creatinine, Ser 9.14  0.50 - 1.35 (mg/dL)   Calcium 8.7  8.4 - 78.2 (mg/dL)   GFR calc non Af Amer >90  >90 (mL/min)   GFR calc Af Amer >90  >90 (mL/min)  POCT I-STAT TROPONIN I      Component Value Range   Troponin i, poc 0.00  0.00 - 0.08 (ng/mL)   Comment 3            Results for JULLIAN, CLAYSON (MRN [id number]) as of 02/08/2011 20:07  Ref. Range 01/28/2011 06:15 02/05/2011 20:04 02/08/2011 06:10 02/08/2011 18:35  Sodium Latest Range: 135-145 mEq/L 136 122 (L) 119 (LL) 118 (LL)     Dg Chest 2 View  02/08/2011  *RADIOLOGY REPORT*  Clinical Data: Hypertension.  Cough.  CHEST - 2 VIEW  Comparison: 04/29/2010.  Findings: The heart remains normal in size with a mild increase in size.  Interval diffuse peribronchial thickening and accentuation of the interstitial markings.  Interval mild prominence of the pulmonary vasculature.  No pleural fluid.  Unremarkable bones.  IMPRESSION:  1.  Interval mild pulmonary vascular congestion, mild bronchitic changes and possible mild interstitial pulmonary edema. 2.  Normal sized heart with a mild increase in size.  Original Report Authenticated By: Darrol Angel, M.D.   Ct Head Wo Contrast  02/08/2011  *RADIOLOGY REPORT*  Clinical Data: 50 year old male with hypertension, low sodium.  CT HEAD WITHOUT CONTRAST  Technique:  Contiguous axial images were obtained from the base of the skull through the vertex without contrast.  Comparison: 04/29/2010.  Findings: Visualized paranasal sinuses and mastoids are clear. Visualized orbits and scalp soft tissues are within normal limits. No acute osseous  abnormality identified.  Cerebral volume is within normal limits for age.  No midline shift, ventriculomegaly, mass effect, evidence of mass lesion, intracranial hemorrhage or evidence of cortically based acute infarction.  Gray-white matter differentiation is within normal limits throughout the brain.  Stable appearance of the brain stem. No suspicious intracranial vascular hyperdensity.  IMPRESSION: Stable, normal noncontrast CT appearance of the brain.  Original Report Authenticated By: Harley Hallmark, M.D.   9:04 PM:  IV NS infusing.  Will hold 3% NS for now, given pt is without seizures or new AMS (was originally admitted to [facility] with psychosis with normal Na).  T/C to [facility] Dr. Nedra Hai, case discussed, including:  HPI, pertinent PM/SHx, VS/PE, dx testing, ED course and treatment.  Agreeable with IV NS infusion at this time, agreeable to admit.  Requests to write temporary orders, medical bed to team 6/Dr. Isidoro Donning.   Shawnta Schlegel Allison Quarry, DO 02/09/11 0024

## 2011-02-08 NOTE — Progress Notes (Signed)
[facility] Group Notes:  (Counselor/Nursing/MHT/Case Management/Adjunct)  02/08/2011 3:14 PM  Type of Therapy:  Group Therapy  Participation Level:  Minimal  Participation Quality:  Drowsy and Sharing  Affect:  Blunted  Cognitive:  Delusional  Insight:  None  Engagement in Group:  Limited  Engagement in Therapy:  Limited  Modes of Intervention:  Clarification and Support  Summary of Progress/Problems: Patient was quiet through most of group. At the end he stated that he didn't appreciate staff calling him by other names. He accused an MHT and others for calling him names. The MHT clarified that she had called him this because he had asked to be called this. Patient had also signed today's documentation with another name. He wanted to be called [facility] today. Counselor told him that we would call him by his medical record name to avoid confusion. Also accused the nurses of switching his meds. Explained to him that medications were changed due to his sodium level. Didn't appear to accept explanation.   HartisAram Beecham 02/08/2011, 3:14 PM

## 2011-02-08 NOTE — ED Notes (Signed)
Pt is very calm and cooperative at the moment and knew his name.  There is a Comptroller from behavioral health at the bedside.

## 2011-02-08 NOTE — Tx Team (Signed)
Interdisciplinary Treatment Plan Update (Adult)  Date:  02/08/2011  Time Reviewed:  10:03 AM   Progress in Treatment: Attending groups: Yes. Participating in groups:  No. and As evidenced by:  Mostly sleeps through groups Taking medication as prescribed:  Yes. Tolerating medication:  Yes. Family/Significant othe contact made:  No, will contact:  family members, but cannot do so until have phone numbers Patient understands diagnosis:  No. and As evidenced by:  No insight, poor judgment Discussing patient identified problems/goals with staff:  No. Medical problems stabilized or resolved:  No. and As evidenced by:  sodium level is 119, consult has been called.  Depakote has been discontinued as well. Denies suicidal/homicidal ideation: Yes. Issues/concerns per patient self-inventory:  No. Other:  New problem(s) identified: No, Describe:  other than medical listed above  Reason for Continuation of Hospitalization: Delusions  Hallucinations Medical Issues Medication stabilization Other; describe Disorganization  Interventions implemented related to continuation of hospitalization:  Medication monitoring and adjustment, safety checks Q15 min., group therapy, psychoeducation, collateral contact  Additional comments:  Not applicable  Estimated length of stay:  4-5 days  Discharge Plan:  Unknown since cannot get in touch with family  New goal(s):  Not applicable  Review of initial/current patient goals per problem list:   1. Goal(s): Reduce psychosis/delusions to baseline  Met: No  Target date: By Discharge  As evidenced by: Delusions remain in force, appears to respond to internal stimuli at times 2. Goal (s): Learn 3 coping skills to deal with anger  Met: No  Target date: By Discharge  As evidenced by: Does not acknowledge need to learn specific techniques from groups, too psychotic for staff to separate out whether has learned actual skills 3. Goal(s): Increase willingness to  comply with medications after discharge  Met: No  Target date: By Discharge  As evidenced by: Still unable to verbalize need to stay on medications, keeps saying "I'm fine, I'm wonderful". 4. Goal(s): Needs medical follow-up  Met: No  Target date: By Discharge  As evidenced by: Appointments to be put in place by NP  Attendees: Patient:  Did not attend 02/08/2011  10:03 AM   Family:     Physician:  Dr. Gwenyth Bouillon Ahluwalia 02/08/2011  10:03 AM   Nursing:    02/08/2011  10:03 AM   Case Manager:  Ambrose Mantle, LCSW 02/08/2011  10:03 AM   Counselor:  Veto Kemps, MT-BC 02/08/2011  10:03 AM   Other:   Other:     Other:     Other:      Scribe for Treatment Team:   [name], 02/08/2011, 10:03 AM

## 2011-02-08 NOTE — ED Notes (Signed)
Pt resting in bed awaiting room assignment. No distress noted. ABC's intact. VSS. Sitter at bedside.

## 2011-02-08 NOTE — ED Notes (Signed)
Pt is from [facility]. Pt labs are abnormal. Pt needs repeat labs. Pt bp 148

## 2011-02-09 ENCOUNTER — Inpatient Hospital Stay (HOSPITAL_COMMUNITY)
Admission: AD | Admit: 2011-02-09 | Discharge: 2011-02-13 | DRG: 641 | Disposition: A | Discharge status: Other Acute Inpatient Hospital | Payer: Medicare Other | Source: Ambulatory Visit | Attending: Internal Medicine | Admitting: Internal Medicine

## 2011-02-09 DIAGNOSIS — IMO0001 Reserved for inherently not codable concepts without codable children: Secondary | ICD-10-CM | POA: Diagnosis present

## 2011-02-09 DIAGNOSIS — E1165 Type 2 diabetes mellitus with hyperglycemia: Secondary | ICD-10-CM | POA: Diagnosis present

## 2011-02-09 DIAGNOSIS — T4275XA Adverse effect of unspecified antiepileptic and sedative-hypnotic drugs, initial encounter: Secondary | ICD-10-CM | POA: Diagnosis present

## 2011-02-09 DIAGNOSIS — IMO0002 Reserved for concepts with insufficient information to code with codable children: Secondary | ICD-10-CM | POA: Diagnosis present

## 2011-02-09 DIAGNOSIS — F319 Bipolar disorder, unspecified: Secondary | ICD-10-CM | POA: Diagnosis present

## 2011-02-09 DIAGNOSIS — E871 Hypo-osmolality and hyponatremia: Principal | ICD-10-CM | POA: Diagnosis present

## 2011-02-09 DIAGNOSIS — F259 Schizoaffective disorder, unspecified: Secondary | ICD-10-CM | POA: Diagnosis present

## 2011-02-09 DIAGNOSIS — I10 Essential (primary) hypertension: Secondary | ICD-10-CM | POA: Diagnosis present

## 2011-02-09 LAB — URINE CULTURE: Culture: NO GROWTH

## 2011-02-09 LAB — CREATININE, SERUM
Creatinine, Ser: 0.9 mg/dL (ref 0.50–1.35)
GFR calc Af Amer: >90 mL/min (ref >90)
GFR calc non Af Amer: >90 mL/min (ref >90)

## 2011-02-09 LAB — GLUCOSE, CAPILLARY
Glucose-Capillary: 174 mg/dL — ABNORMAL HIGH (ref 70–99)
Glucose-Capillary: 173 mg/dL — ABNORMAL HIGH (ref 70–99)

## 2011-02-09 LAB — BASIC METABOLIC PANEL
BUN: 13 mg/dL (ref 6–23)
CO2: 26 mEq/L (ref 19–32)
Chloride: 94 mEq/L — ABNORMAL LOW (ref 96–112)
Creatinine, Ser: 0.96 mg/dL (ref 0.50–1.35)
GFR calc Af Amer: >90 mL/min (ref >90)
GFR calc non Af Amer: >90 mL/min (ref >90)
Potassium: 5 mEq/L (ref 3.5–5.1)
Potassium: 5.1 mEq/L (ref 3.5–5.1)
Sodium: 131 mEq/L — ABNORMAL LOW (ref 135–145)
Sodium: 127 mEq/L — ABNORMAL LOW (ref 135–145)

## 2011-02-09 LAB — CBC
HCT: 37.3 % — ABNORMAL LOW (ref 39.0–52.0)
Hemoglobin: 14 g/dL (ref 13.0–17.0)
Hemoglobin: 12.9 g/dL — ABNORMAL LOW (ref 13.0–17.0)
MCHC: 34.9 g/dL (ref 30.0–36.0)
MCV: 91.2 fL (ref 78.0–100.0)
RBC: 4.4 MIL/uL (ref 4.22–5.81)
RBC: 4.09 MIL/uL — ABNORMAL LOW (ref 4.22–5.81)
WBC: 7 10*3/uL (ref 4.0–10.5)
WBC: 6 10*3/uL (ref 4.0–10.5)

## 2011-02-09 MED ORDER — ENOXAPARIN SODIUM 40 MG/0.4ML ~~LOC~~ SOLN
40.0000 mg | SUBCUTANEOUS | Status: DC
  Administered 2011-02-11 – 2011-02-13 (×4): 40 mg via SUBCUTANEOUS
  Filled 2011-02-09 (×4): qty 0.4

## 2011-02-09 MED ORDER — INSULIN ASPART 100 UNIT/ML ~~LOC~~ SOLN
0.0000 [IU] | Freq: Three times a day (TID) | SUBCUTANEOUS | Status: DC
  Administered 2011-02-10 (×3): 2 [IU] via SUBCUTANEOUS
  Administered 2011-02-11: 3 [IU] via SUBCUTANEOUS
  Administered 2011-02-11 – 2011-02-12 (×2): 2 [IU] via SUBCUTANEOUS
  Administered 2011-02-12: 3 [IU] via SUBCUTANEOUS
  Filled 2011-02-09: qty 3

## 2011-02-09 MED ORDER — SODIUM CHLORIDE 0.9 % IV SOLN
INTRAVENOUS | Status: DC
  Administered 2011-02-09 (×2): via INTRAVENOUS

## 2011-02-09 MED ORDER — GLIPIZIDE ER 5 MG PO TB24
5.0000 mg | ORAL_TABLET | Freq: Every day | ORAL | Status: DC
  Administered 2011-02-10 – 2011-02-13 (×4): 5 mg via ORAL
  Filled 2011-02-09 (×4): qty 1

## 2011-02-09 MED ORDER — INSULIN ASPART 100 UNIT/ML ~~LOC~~ SOLN
3.0000 [IU] | Freq: Three times a day (TID) | SUBCUTANEOUS | Status: DC
  Administered 2011-02-10 – 2011-02-13 (×10): 3 [IU] via SUBCUTANEOUS

## 2011-02-09 MED ORDER — METFORMIN HCL 500 MG PO TABS
1000.0000 mg | ORAL_TABLET | Freq: Two times a day (BID) | ORAL | Status: DC
  Administered 2011-02-10 – 2011-02-13 (×7): 1000 mg via ORAL
  Filled 2011-02-09 (×8): qty 2

## 2011-02-09 MED ORDER — ASPIRIN 81 MG PO CHEW
81.0000 mg | CHEWABLE_TABLET | Freq: Every day | ORAL | Status: DC
  Administered 2011-02-10 – 2011-02-13 (×4): 81 mg via ORAL
  Filled 2011-02-09 (×4): qty 1

## 2011-02-09 MED ORDER — ONDANSETRON HCL 4 MG/2ML IJ SOLN: 4.0000 mg | Freq: Three times a day (TID) | INTRAMUSCULAR | Status: DC | PRN

## 2011-02-09 MED ORDER — LISINOPRIL 10 MG PO TABS
10.0000 mg | ORAL_TABLET | Freq: Every day | ORAL | Status: DC
  Administered 2011-02-10 – 2011-02-13 (×4): 10 mg via ORAL
  Filled 2011-02-09 (×4): qty 1

## 2011-02-09 MED ORDER — AMLODIPINE BESYLATE 10 MG PO TABS
10.0000 mg | ORAL_TABLET | Freq: Every day | ORAL | Status: DC
  Administered 2011-02-10 – 2011-02-13 (×4): 10 mg via ORAL
  Filled 2011-02-09 (×4): qty 1

## 2011-02-09 MED ORDER — ENOXAPARIN SODIUM 40 MG/0.4ML ~~LOC~~ SOLN
40.0000 mg | SUBCUTANEOUS | Status: DC
  Administered 2011-02-09: 40 mg via SUBCUTANEOUS
  Filled 2011-02-09 (×3): qty 0.4

## 2011-02-09 MED ORDER — CLONIDINE HCL 0.1 MG PO TABS
0.1000 mg | ORAL_TABLET | Freq: Two times a day (BID) | ORAL | Status: DC
  Administered 2011-02-10 – 2011-02-13 (×7): 0.1 mg via ORAL
  Filled 2011-02-09 (×8): qty 1

## 2011-02-09 MED ORDER — RISPERIDONE 3 MG PO TABS
3.0000 mg | ORAL_TABLET | Freq: Two times a day (BID) | ORAL | Status: DC
  Administered 2011-02-10 – 2011-02-13 (×7): 3 mg via ORAL
  Filled 2011-02-09 (×10): qty 1

## 2011-02-09 MED ORDER — SODIUM CHLORIDE 0.9 % IV SOLN
INTRAVENOUS | Status: AC
  Administered 2011-02-09 – 2011-02-10 (×2): via INTRAVENOUS

## 2011-02-09 MED ORDER — LORAZEPAM 1 MG PO TABS
2.0000 mg | ORAL_TABLET | Freq: Four times a day (QID) | ORAL | Status: DC | PRN
  Administered 2011-02-10 – 2011-02-12 (×3): 2 mg via ORAL
  Filled 2011-02-09: qty 1
  Filled 2011-02-09 (×2): qty 2
  Filled 2011-02-09: qty 1

## 2011-02-09 NOTE — Progress Notes (Signed)
Subjective: Patient seen and emergency room. He is calm and has no complaints. He denies any chest pain or shortness of breath. He denies any dizziness. He is able to interact with me appropriately.  Objective: Weight change:   Intake/Output Summary (Last 24 hours) at 02/09/11 1319 Last data filed at 02/09/11 0304  Gross per 24 hour  Intake      0 ml  Output   1830 ml  Net  -1830 ml   BP 157/98  Pulse 97  Temp(Src) 98.9 F (37.2 C) (Oral)  Resp 18  Ht 5\' 4"  (1.626 m)  Wt 80.74 kg (178 lb)  BMI 30.55 kg/m2  SpO2 97%  BP 157/98  Pulse 97  Temp(Src) 98.9 F (37.2 C) (Oral)  Resp 18  Ht 5\' 4"  (1.626 m)  Wt 80.74 kg (178 lb)  BMI 30.55 kg/m2  SpO2 97% General appearance: alert, cooperative, appears older than stated age, distracted and no distress Lungs: clear to auscultation bilaterally Heart: regular rate and rhythm, S1, S2 normal, no murmur, click, rub or gallop Abdomen: soft, non-tender; bowel sounds normal; no masses,  no organomegaly Extremities: extremities normal, atraumatic, no cyanosis or edema   Lab Results: Basic Metabolic Panel:  [facility] 02/09/11 1220 02/08/11 1835  NA 131* 118*  K 5.0 4.5  CL 95* 82*  CO2 29 28  GLUCOSE 165* 152*  BUN 13 12  CREATININE 0.900.92 0.73  CALCIUM 9.7 8.7  MG -- --  PHOS -- --   CBC:  Basename 02/09/11 1220 02/08/11 1835  WBC 7.0 7.9  NEUTROABS -- 5.2  HGB 14.0 12.8*  HCT 40.1 36.2*  MCV 91.1 89.4  PLT 202 169  CBG:  Basename 02/09/11 1135 02/08/11 2256 02/08/11 1706 02/08/11 1146 02/08/11 0611 02/07/11 2136  GLUCAP 174* 274* 149* 153* 155* 200*    Medications: Scheduled Meds:   . amLODipine  10 mg Oral Daily  . aspirin  81 mg Oral Daily  . cloNIDine  0.1 mg Oral BID  . enoxaparin  40 mg Subcutaneous Q24H  . glipiZIDE  5 mg Oral QAC breakfast  . insulin aspart  0-15 Units Subcutaneous TID AC  . insulin aspart  3 Units Subcutaneous TID WC  . lisinopril  10 mg Oral Daily  . metFORMIN  1,000 mg Oral  BID WC  . risperiDONE  3 mg Oral BID   Continuous Infusions:   . sodium chloride 150 mL/hr at 02/09/11 1252  . DISCONTD: sodium chloride 75 mL/hr at 02/08/11 1910   PRN Meds:.acetaminophen, alum & mag hydroxide-simeth, diphenhydrAMINE, LORazepam, magnesium hydroxide, ondansetron (ZOFRAN) IV  Assessment/Plan: Patient Active Hospital Problem List: Hyponatremia (02/06/2011)   Assessment: Likely from Depakote. Agree with holding this medicine. Patient responded well to fluid restriction and sodium near normal. Recheck evening dose. 1 sodium normalized patient stable to go back to behavioral health.  Diabetes mellitus type II, uncontrolled (05/12/2010)  CBG stable. Continue current medications.  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 Transfer back to [facility] once sodium stable  BIPOLAR AFFECTIVE DISORDER (05/12/2010)  as above  Hypertension, uncontrolled (05/12/2010)  BP stable.   LOS: 15 days   KRISHNAN,SENDIL K 02/09/2011, 1:19 PM

## 2011-02-09 NOTE — Discharge Planning (Signed)
ED CM spoke with Dr Chancy Milroy, [facility] hospitalist about admission status.  Dr Rito Ehrlich has followed pt today and states pt may be able to return to [facility] after re-checking sodium level. D/c pending further labs

## 2011-02-09 NOTE — ED Notes (Signed)
Pt and belongings transported to [facility]

## 2011-02-10 LAB — GLUCOSE, CAPILLARY: Glucose-Capillary: 128 mg/dL — ABNORMAL HIGH (ref 70–99)

## 2011-02-10 LAB — BASIC METABOLIC PANEL
CO2: 26 mEq/L (ref 19–32)
Calcium: 9.2 mg/dL (ref 8.4–10.5)
Chloride: 94 mEq/L — ABNORMAL LOW (ref 96–112)
Potassium: 4.4 mEq/L (ref 3.5–5.1)
Sodium: 129 mEq/L — ABNORMAL LOW (ref 135–145)

## 2011-02-10 LAB — TSH: TSH: 2.025 u[IU]/mL (ref 0.350–4.500)

## 2011-02-10 NOTE — Progress Notes (Signed)
Prn Ativan given for agitation.

## 2011-02-10 NOTE — Progress Notes (Signed)
The Pt appears to be getting  increasingly agitated, pacing around the room yelling at the staff members, having a conversation with people that are not in the room, having some  Delusions of grandeur  Stating his Mother is Raechel Ache and his Father is Hotel manager. The  Pt also appears to be having some flight of ideas He states that he has been kidnapped and is on the run. Sitter at the bed side for safety will monitor.

## 2011-02-10 NOTE — Progress Notes (Signed)
Subjective: Patient doing well. His interaction is subdued. He denies any complaints. He insists on being called Mitchell [name],  he voices no complaints. Objective: Weight change:   Intake/Output Summary (Last 24 hours) at 02/10/11 1657 Last data filed at 02/10/11 1146  Gross per 24 hour  Intake   2680 ml  Output      0 ml  Net   2680 ml   BP 107/67  Pulse 97  Temp(Src) 98.3 F (36.8 C) (Oral)  Resp 20  Ht 5\' 7"  (1.702 m)  Wt 86.4 kg (190 lb 7.6 oz)  BMI 29.83 kg/m2  SpO2 97% General: Alert and oriented x2. Slightly withdrawn. No apparent distress. Cardiovascular: Regular rate and rhythm S1-S2, 2/6 systolic ejection murmur. Lungs: Clear bilaterally. Abdomen: Soft, nontender, slight distention from obesity, normoactive bowel sounds Extremities: No clubbing cyanosis trace pitting edema, good peripheral pulses.  Lab Results: Basic Metabolic Panel:  Basename 02/10/11 1102 02/09/11 1350  NA 129* 127*  K 4.4 5.1  CL 94* 94*  CO2 26 26  GLUCOSE 107* 259*  BUN 12 12  CREATININE 0.87 0.96  CALCIUM 9.2 9.1  MG -- --  PHOS -- --  CBC:  Basename 02/09/11 1350 02/09/11 1220 02/08/11 1835  WBC 6.0 7.0 --  NEUTROABS -- -- 5.2  HGB 12.9* 14.0 --  HCT 37.3* 40.1 --  MCV 91.2 91.1 --  PLT 208 202 --   CBG:  Basename 02/10/11 1132 02/10/11 0717 02/09/11 2233 02/09/11 1135 02/08/11 2256 02/08/11 1706  GLUCAP 128* 128* 173* 174* 274* 149*  Thyroid Function Tests:  Basename 02/09/11 1350  TSH 2.025  T4TOTAL --  FREET4 --  T3FREE --  THYROIDAB --    Micro Results: Recent Results (from the past 240 hour(s))  URINE CULTURE     Status: Normal   Collection Time   02/08/11  6:37 PM      Component Value Range Status Comment   Specimen Description URINE, CLEAN CATCH   Final    Special Requests NONE   Final    Setup Time [id number]   Final    Colony Count NO GROWTH   Final    Culture NO GROWTH   Final    Report Status 02/09/2011 FINAL   Final     Medications: Scheduled  Meds:   . sodium chloride   Intravenous STAT  . amLODipine  10 mg Oral Daily  . aspirin  81 mg Oral Daily  . cloNIDine  0.1 mg Oral BID  . enoxaparin (LOVENOX) injection  40 mg Subcutaneous Q24H  . glipiZIDE  5 mg Oral QAC breakfast  . insulin aspart  0-15 Units Subcutaneous TID WC  . insulin aspart  3 Units Subcutaneous TID WC  . lisinopril  10 mg Oral Daily  . metFORMIN  1,000 mg Oral BID WC  . risperiDONE  3 mg Oral BID   Continuous Infusions:  PRN Meds:.LORazepam  Assessment/Plan: Patient Active Hospital Problem List: Patient Active Hospital Problem List: Hyponatremia (02/06/2011)  likely from Depakote. Improving. Continue fluid restriction. Recheck in morning. Once normalized, patient active labor health.  Diabetes mellitus type II, uncontrolled (05/12/2010)  stable.  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 Continue meds.  BIPOLAR AFFECTIVE DISORDER (05/12/2010)  as above.  Hypertension, uncontrolled (05/12/2010)  stable.      LOS: 1 day   Mitchell [name] 02/10/2011, 4:57 PM

## 2011-02-11 LAB — GLUCOSE, CAPILLARY
Glucose-Capillary: 130 mg/dL — ABNORMAL HIGH (ref 70–99)
Glucose-Capillary: 187 mg/dL — ABNORMAL HIGH (ref 70–99)

## 2011-02-11 LAB — BASIC METABOLIC PANEL
Chloride: 96 mEq/L (ref 96–112)
Creatinine, Ser: 0.97 mg/dL (ref 0.50–1.35)
GFR calc Af Amer: >90 mL/min (ref >90)
Sodium: 131 mEq/L — ABNORMAL LOW (ref 135–145)

## 2011-02-11 MED ORDER — GLIPIZIDE ER 5 MG PO TB24: 5.0000 mg | ORAL_TABLET | Freq: Every day | ORAL | Status: DC

## 2011-02-11 MED ORDER — LORAZEPAM 2 MG PO TABS: 2.0000 mg | ORAL_TABLET | Freq: Four times a day (QID) | ORAL | Status: DC | PRN

## 2011-02-11 MED ORDER — CLONIDINE HCL 0.1 MG PO TABS: 0.1000 mg | ORAL_TABLET | Freq: Two times a day (BID) | ORAL | Status: DC

## 2011-02-11 MED ORDER — AMLODIPINE BESYLATE 10 MG PO TABS: 10.0000 mg | ORAL_TABLET | Freq: Every day | ORAL | Status: DC

## 2011-02-11 MED ORDER — LISINOPRIL 10 MG PO TABS: 10.0000 mg | ORAL_TABLET | Freq: Every day | ORAL | Status: DC

## 2011-02-11 MED ORDER — METFORMIN HCL 1000 MG PO TABS: 1000.0000 mg | ORAL_TABLET | Freq: Two times a day (BID) | ORAL | Status: DC

## 2011-02-11 MED ORDER — RISPERIDONE 3 MG PO TABS: 3.0000 mg | ORAL_TABLET | Freq: Two times a day (BID) | ORAL | Status: DC

## 2011-02-11 MED ORDER — ASPIRIN 81 MG PO CHEW: 81.0000 mg | CHEWABLE_TABLET | Freq: Every day | ORAL | Status: DC

## 2011-02-11 MED ORDER — INSULIN ASPART 100 UNIT/ML ~~LOC~~ SOLN: 0.0000 [IU] | Freq: Three times a day (TID) | SUBCUTANEOUS | Status: DC

## 2011-02-11 MED ORDER — INSULIN ASPART 100 UNIT/ML ~~LOC~~ SOLN: 3.0000 [IU] | Freq: Three times a day (TID) | SUBCUTANEOUS | Status: DC

## 2011-02-11 NOTE — Plan of Care (Signed)
Problem: Safety & Psycho Social Goal: Patient will remain free from harm and safe Pt. Remains free from harm

## 2011-02-11 NOTE — Progress Notes (Signed)
[id number]/Rhonda Davis, RN, BSN, CCM/CHART REVIEW FOR UR PERFORMED. 

## 2011-02-11 NOTE — Progress Notes (Signed)
Pt. Agitated pacing around in room ativan 2 mg given as ordered prn for agitation.

## 2011-02-11 NOTE — Discharge Summary (Signed)
DISCHARGE SUMMARY  Job Holtsclaw  MR#: [id number]  DOB:09-04-1960  Date of Admission: 02/09/2011 Date of Discharge: 02/11/2011  Attending Physician:Saniyah Mondesir K  Patient's PCP:No primary provider on file.  Consults:  psychiatry  Discharge Diagnoses: Present on Admission:  .Diabetes mellitus type II, uncontrolled .Hyponatremia .SCHIZOAFFECTIVE DISORDER .BIPOLAR AFFECTIVE DISORDER .Hypertension, uncontrolled    Current Discharge Medication List    START taking these medications   Details  amLODipine (NORVASC) 10 MG tablet Take 1 tablet (10 mg total) by mouth daily.    aspirin 81 MG chewable tablet Chew 1 tablet (81 mg total) by mouth daily.    cloNIDine (CATAPRES) 0.1 MG tablet Take 1 tablet (0.1 mg total) by mouth 2 (two) times daily. Qty: 60 tablet, Refills: 0    glipiZIDE (GLUCOTROL XL) 5 MG 24 hr tablet Take 1 tablet (5 mg total) by mouth daily before breakfast.    !! insulin aspart (NOVOLOG) 100 UNIT/ML injection Inject 0-15 Units into the skin 3 (three) times daily with meals. Qty: 1 vial, Refills: 0    !! insulin aspart (NOVOLOG) 100 UNIT/ML injection Inject 3 Units into the skin 3 (three) times daily with meals. Qty: 1 vial, Refills: 0    lisinopril (PRINIVIL,ZESTRIL) 10 MG tablet Take 1 tablet (10 mg total) by mouth daily.    LORazepam (ATIVAN) 2 MG tablet Take 1 tablet (2 mg total) by mouth every 6 (six) hours as needed for anxiety (and agitation). Qty: 30 tablet    metFORMIN (GLUCOPHAGE) 1000 MG tablet Take 1 tablet (1,000 mg total) by mouth 2 (two) times daily with a meal.    risperiDONE (RISPERDAL) 3 MG tablet Take 1 tablet (3 mg total) by mouth 2 (two) times daily.     !! - Potential duplicate medications found. Please discuss with provider.        Hospital Course: Present on Admission:  .Diabetes mellitus type II, uncontrolled .Hyponatremia .SCHIZOAFFECTIVE DISORDER .BIPOLAR AFFECTIVE DISORDER .Hypertension, uncontrolled: Patient is a  [name] with past history of bipolar disorder and schizoaffective disorder who was at behavioral health for acute psychosis. [facility] hospitalists were managing his blood pressure and diabetes as consultants at the facility. It was noted that his sodium was slightly low and followup labs after several days noted a further lowering sodium. This is likely felt to be secondary to Depakote which patient was receiving for acute psychosis.  Patient Depakote was put on hold and the patient was transferred to ER for further evaluation. There his sodium was noted to be 118. It was likely felt after reviewing the patient's osmolality (both serum and urine) that this was indeed secondary to Depakote. Patient was put on 1200 cc fluid restriction And his sodium trended up. As a day of discharge his sodium was up to 131. He subsequently medically stable to go back to behavioral health.  In regard to his psychoses, at times patient would act out requiring Ativan. A sitter was kept continuously.  Day of Discharge BP 121/64  Pulse 75  Temp(Src) 98.2 F (36.8 C) (Oral)  Resp 20  Ht 5\' 7"  (1.702 m)  Wt 88 kg (194 lb 0.1 oz)  BMI 30.39 kg/m2  SpO2 100%  Physical Exam: General: Alert and oriented x2. Slightly withdrawn. No apparent distress.  Cardiovascular: Regular rate and rhythm S1-S2, 2/6 systolic ejection murmur.  Lungs: Clear bilaterally.  Abdomen: Soft, nontender, slight distention from obesity, normoactive bowel sounds  Extremities: No clubbing cyanosis trace pitting edema, good peripheral pulses.   Results for  orders placed during the hospital encounter of 02/09/11 (from the past 24 hour(s))  GLUCOSE, CAPILLARY     Status: Abnormal   Collection Time   02/10/11 11:32 AM      Component Value Range   Glucose-Capillary 128 (*) 70 - 99 (mg/dL)   Comment 1 Notify RN    GLUCOSE, CAPILLARY     Status: Abnormal   Collection Time   02/10/11  4:56 PM      Component Value Range    Glucose-Capillary 130 (*) 70 - 99 (mg/dL)   Comment 1 Notify RN    GLUCOSE, CAPILLARY     Status: Abnormal   Collection Time   02/10/11 10:13 PM      Component Value Range   Glucose-Capillary 154 (*) 70 - 99 (mg/dL)  BASIC METABOLIC PANEL     Status: Abnormal   Collection Time   02/11/11  5:18 AM      Component Value Range   Sodium 131 (*) 135 - 145 (mEq/L)   Potassium 4.4  3.5 - 5.1 (mEq/L)   Chloride 96  96 - 112 (mEq/L)   CO2 27  19 - 32 (mEq/L)   Glucose, Bld 122 (*) 70 - 99 (mg/dL)   BUN 14  6 - 23 (mg/dL)   Creatinine, Ser 9.60  0.50 - 1.35 (mg/dL)   Calcium 9.1  8.4 - 45.4 (mg/dL)   GFR calc non Af Amer >90  >90 (mL/min)   GFR calc Af Amer >90  >90 (mL/min)  GLUCOSE, CAPILLARY     Status: Abnormal   Collection Time   02/11/11  7:38 AM      Component Value Range   Glucose-Capillary 187 (*) 70 - 99 (mg/dL)    Disposition: Improved, and transferred back to behavioral health   Follow-up Appts: Discharge Orders    Future Orders Please Complete By Expires   Diet - low sodium heart healthy      Scheduling Instructions:   Fluid restrict to 1500 cc initially x4 days   Increase activity slowly          Signed: [name] 02/11/2011, 11:31 AM

## 2011-02-12 DIAGNOSIS — E871 Hypo-osmolality and hyponatremia: Principal | ICD-10-CM

## 2011-02-12 LAB — GLUCOSE, CAPILLARY
Glucose-Capillary: 136 mg/dL — ABNORMAL HIGH (ref 70–99)
Glucose-Capillary: 96 mg/dL (ref 70–99)
Glucose-Capillary: 123 mg/dL — ABNORMAL HIGH (ref 70–99)

## 2011-02-12 NOTE — Progress Notes (Signed)
RN was just notified of bed placement by [facility].  Patient is to be placed into room 406-Bed 2.  Facility is requesting IVC papers to be filled out and faxed prior to patient's transfer.  Original copy of IVC to be sent with patient.  Lyla Son, on-call SW notified and message left.

## 2011-02-12 NOTE — Consult Note (Signed)
Patient Identification:  Mitchell [name] Date of Evaluation:  02/12/2011   History of Present Illness: 50 year old the male with history of schizoaffective disorder who was admitted from [facility] to the medical floor because of the dehydration. Patient is currently medically stable as per Dr. Berneda Rose. Patient thought process is still disorganized but is calm cooperative pleasant on approach. He denies having suicidal or homicidal ideation but seems to be still responding to inner stimuli. I will transfer the patient back to [facility] for further stabilization. Patient is agreeable with this plan.   Past Medical History:     Past Medical History  Diagnosis Date  . Hypertension   . Diabetes mellitus   . Mental disorder   . Bipolar affective disorder   . Schizoaffective disorder       No past surgical history on file.  Allergies: No Known Allergies  Current Medications:  Prior to Admission medications   Medication Sig Start Date End Date Taking? Authorizing Provider  amLODipine (NORVASC) 10 MG tablet Take 1 tablet (10 mg total) by mouth daily. 02/11/11 02/11/12  [name], MD  aspirin 81 MG chewable tablet Chew 1 tablet (81 mg total) by mouth daily. 02/11/11 02/11/12  [name], MD  cloNIDine (CATAPRES) 0.1 MG tablet Take 1 tablet (0.1 mg total) by mouth 2 (two) times daily. 02/11/11 02/11/12  [name], MD  glipiZIDE (GLUCOTROL XL) 5 MG 24 hr tablet Take 1 tablet (5 mg total) by mouth daily before breakfast. 02/11/11 02/11/12  [name], MD  insulin aspart (NOVOLOG) 100 UNIT/ML injection Inject 0-15 Units into the skin 3 (three) times daily with meals. 02/11/11 02/11/12  [name], MD  insulin aspart (NOVOLOG) 100 UNIT/ML injection Inject 3 Units into the skin 3 (three) times daily with meals. 02/11/11 02/11/12  [name], MD  lisinopril (PRINIVIL,ZESTRIL) 10 MG tablet Take 1 tablet (10 mg total) by mouth daily. 02/11/11 02/11/12  [name], MD    LORazepam (ATIVAN) 2 MG tablet Take 1 tablet (2 mg total) by mouth every 6 (six) hours as needed for anxiety (and agitation). 02/11/11 02/21/11  [name], MD  metFORMIN (GLUCOPHAGE) 1000 MG tablet Take 1 tablet (1,000 mg total) by mouth 2 (two) times daily with a meal. 02/11/11 02/11/12  [name], MD  risperiDONE (RISPERDAL) 3 MG tablet Take 1 tablet (3 mg total) by mouth 2 (two) times daily. 02/11/11 03/13/11  [name], MD    Social History:    reports that he has quit smoking. His smoking use included Cigarettes. He has never used smokeless tobacco. He reports that he does not drink alcohol or use illicit drugs.   Family History:    No family history on file.  Mental Status Examination/Evaluation: Objective:  Appearance: Disheveled  Psychomotor Activity:  Normal  Eye Contact::  Fair  Speech:  Normal Rate  Volume:  Normal  Mood:  I am feeling okay as per patient   Affect:  Full Range  Thought Process:  Still not logical and goal-directed   Orientation:  Full  Thought Content:  Denies arguing hallucinations but seemed to be internally preoccupied.   Suicidal Thoughts:  No  Homicidal Thoughts:  No  Judgement:  Impaired  Insight:  Shallow    Assessment:   AXIS I  schizoaffective disorder.   AXIS II  Deffered  AXIS III See medical notes.  AXIS IV  chronic mental health issues.   AXIS V 41-50 serious symptoms  Recommendations: Transfer to [facility] if medically stable.   Eulogio Ditch, MD 11/6/201211:40 AM

## 2011-02-12 NOTE — Progress Notes (Addendum)
Interval History: Mitchell [name] is a 50 year old male who was admitted from behavioral health on 02/10/2011 with Depakote-induced hyponatremia. He was put on a fluid restriction and monitored for several days with improvement in his sodium. He was discharged yesterday by Dr. Rito Ehrlich, but had to be re-evaluated by psychiatry before he could be accepted back to behavioral health.  He has now been seen and reevaluated by psychiatry and accepted back in transfer to behavioral health. ROS: Mitchell [name] is continuing to speak in a delusional manner. He denies any physical complaints.   Objective: Vital signs in last 24 hours: Temp:  [97.8 F (36.6 C)-98.1 F (36.7 C)] 97.8 F (36.6 C) (11/16 0700) Pulse Rate:  [79-89] 79  (11/16 0700) Resp:  [16-18] 18  (11/16 0700) BP: (122-125)/(78-79) 122/78 mmHg (11/16 0700) SpO2:  [95 %-98 %] 98 % (11/16 0700) Weight:  [85.4 kg (188 lb 4.4 oz)] 188 lb 4.4 oz (85.4 kg) (11/16 0700) Weight change: 0.5 kg (1 lb 1.6 oz) Last BM Date: 02/11/11  Intake/Output from previous day:  Intake/Output Summary (Last 24 hours) at 02/12/11 1401 Last data filed at 02/12/11 0742  Gross per 24 hour  Intake   1200 ml  Output      0 ml  Net   1200 ml   CBG (last 3)   Basename 02/12/11 1157 02/12/11 0721 02/11/11 2133  GLUCAP 123* 96 136*      Physical Exam:  Gen:  Mitchell [name] delusional. Cardiovascular:  Regular rate, and rhythm. No murmurs, rubs, or gallops. Respiratory: Lungs clear to auscultation bilaterally with good air movement. Gastrointestinal: Soft, nontender, nondistended with normal active bowel sounds. Extremities: No clubbing, edema, or cyanosis.   Lab Results: Results for orders placed during the hospital encounter of 02/09/11 (from the past 24 hour(s))  GLUCOSE, CAPILLARY     Status: Abnormal   Collection Time   02/11/11  5:07 PM      Component Value Range   Glucose-Capillary 140 (*) 70 - 99 (mg/dL)   Comment 1 Notify RN    GLUCOSE, CAPILLARY      Status: Abnormal   Collection Time   02/11/11  9:33 PM      Component Value Range   Glucose-Capillary 136 (*) 70 - 99 (mg/dL)  GLUCOSE, CAPILLARY     Status: Normal   Collection Time   02/12/11  7:21 AM      Component Value Range   Glucose-Capillary 96  70 - 99 (mg/dL)   Comment 1 Notify RN    GLUCOSE, CAPILLARY     Status: Abnormal   Collection Time   02/12/11 11:57 AM      Component Value Range   Glucose-Capillary 123 (*) 70 - 99 (mg/dL)   Comment 1 Notify RN     Recent Results (from the past 240 hour(s))  URINE CULTURE     Status: Normal   Collection Time   02/08/11  6:37 PM      Component Value Range Status Comment   Specimen Description URINE, CLEAN CATCH   Final    Special Requests NONE   Final    Setup Time [id number]   Final    Colony Count NO GROWTH   Final    Culture NO GROWTH   Final    Report Status 02/09/2011 FINAL   Final     Studies/Results: No results found.  Medications: Scheduled Meds:   . amLODipine  10 mg Oral Daily  . aspirin  81 mg Oral Daily  .  cloNIDine  0.1 mg Oral BID  . enoxaparin (LOVENOX) injection  40 mg Subcutaneous Q24H  . glipiZIDE  5 mg Oral QAC breakfast  . insulin aspart  0-15 Units Subcutaneous TID WC  . insulin aspart  3 Units Subcutaneous TID WC  . lisinopril  10 mg Oral Daily  . metFORMIN  1,000 mg Oral BID WC  . risperiDONE  3 mg Oral BID   Continuous Infusions:  PRN Meds:.LORazepam  Assessment/Plan:  Principal Problem:  *Hyponatremia Thought to be induced by Depakote. Patient's Depakote was discontinued and he was put on a fluid restriction with gradual improvement in his sodium values. Recommend ongoing fluid restriction of 1500 cc for the next 3 days. Active Problems:  Diabetes mellitus type II, uncontrolled The patient's CBGs have ranged from 96-136. Continue current regimen.  SCHIZOAFFECTIVE DISORDER Needs further inpatient care. Transfer to behavioral health.  BIPOLAR AFFECTIVE DISORDER Needs further  inpatient care. Transfer to behavioral health.  Hypertension, uncontrolled Blood pressure controlled on Norvasc and clonidine.    LOS: 3 days   Hillery Aldo, MD Pager [phone]  02/12/2011, 2:01 PM

## 2011-02-12 NOTE — Progress Notes (Signed)
Initial visit on referral from nursing and chaplain.  Pt was talkative, not well oriented - exhibited hallucinatory behavior and flight of ideas.  Was able to orient toward things present in room.  Pt talked with chaplain about how he is very faithful and is concerned about those around him.   Pt expressed feeling content and safe and spoke with chaplain about feeling happy that he will [facility] to "get his medicines worked outConstellation Energy provided support.     02/12/11 1500  Clinical Encounter Type  Visited With Patient Psychiatrist)  Visit Type Psychological support;Spiritual support;Behavioral Health;Initial  Referral From Nurse;Chaplain  Consult/Referral To Nurse  Spiritual Encounters  Spiritual Needs Emotional

## 2011-02-12 NOTE — Progress Notes (Signed)
Sent clinicals to [facility].  Awaiting acceptance/bed availability.  CSW will continue to follow to assist with d/c planning.   Contact [phone]

## 2011-02-12 NOTE — BH Assessment (Signed)
Assessment Note   Mitchell [name] is an 50 y.o. male  With history of schizoaffective disorder who was admitted from [facility] to the medical floor because of the dehydration.Pt was at behavioral health for acute psychosis. During his admission at behavioral health, it was noted that his sodium was slightly low and followup labs after several days noted a further lowering sodium.  This is felt to be secondary to depakote which patient was receiving for acute psychosis.  Patient was then tranfered to ER for firther evaluation.    Patient is currently medically stable as per Dr. Berneda Rose.  Patient thought process is still disorganized but is calm cooperative pleasant on approach. Pt denies suicidal or homicidal ideation but seems to be still responding to inner stimuli.    Pt will be transferred back to [facility] for further stabilization.    Axis I: Schizoaffective Disorder Axis II: Deferred Axis III:  Past Medical History  Diagnosis Date  . Hypertension   . Diabetes mellitus   . Mental disorder   . Bipolar affective disorder   . Schizoaffective disorder    Axis IV: other psychosocial or environmental problems and problems related to social environment and chronic mental health issues Axis V: 31-40 impairment in reality testing  Past Medical History:  Past Medical History  Diagnosis Date  . Hypertension   . Diabetes mellitus   . Mental disorder   . Bipolar affective disorder   . Schizoaffective disorder     No past surgical history on file.  Family History: No family history on file.  Social History:  reports that he has quit smoking. His smoking use included Cigarettes. He has never used smokeless tobacco. He reports that he does not drink alcohol or use illicit drugs.  Allergies: No Known Allergies  Home Medications:  Medications Prior to Admission  Medication Dose Route Frequency Provider Last Rate Last Dose  . 0.9 %  sodium chloride infusion   Intravenous STAT Laray Anger, DO 100  mL/hr at 02/10/11 0100    . amLODipine (NORVASC) tablet 10 mg  10 mg Oral Daily Peter Le   10 mg at 02/12/11 0917  . aspirin chewable tablet 81 mg  81 mg Oral Daily Peter Le   81 mg at 02/12/11 0917  . cloNIDine (CATAPRES) tablet 0.1 mg  0.1 mg Oral BID Peter Le   0.1 mg at 02/12/11 1610  . enoxaparin (LOVENOX) injection 40 mg  40 mg Subcutaneous Q24H Peter Le   40 mg at 02/12/11 1227  . glipiZIDE (GLUCOTROL XL) 24 hr tablet 5 mg  5 mg Oral QAC breakfast Houston Siren   5 mg at 02/12/11 0917  . insulin aspart (novoLOG) injection 0-15 Units  0-15 Units Subcutaneous TID WC Peter Le   2 Units at 02/12/11 1225  . insulin aspart (novoLOG) injection 3 Units  3 Units Subcutaneous TID WC Peter Le   3 Units at 02/12/11 1226  . lisinopril (PRINIVIL,ZESTRIL) tablet 10 mg  10 mg Oral Daily Peter Le   10 mg at 02/12/11 0917  . LORazepam (ATIVAN) tablet 2 mg  2 mg Oral Q6H PRN Houston Siren   2 mg at 02/11/11 0911  . metFORMIN (GLUCOPHAGE) tablet 1,000 mg  1,000 mg Oral BID WC Peter Le   1,000 mg at 02/12/11 0916  . risperiDONE (RISPERDAL) tablet 3 mg  3 mg Oral BID Houston Siren   3 mg at 02/12/11 0916  . DISCONTD: 0.9 %  sodium chloride infusion   Intravenous  Continuous Laray Anger, DO      . DISCONTD: 0.9 %  sodium chloride infusion   Intravenous Continuous Peter Le 150 mL/hr at 02/09/11 1556    . DISCONTD: acetaminophen (TYLENOL) tablet 650 mg  650 mg Oral Q6H PRN Charyl Dancer, PHARMD      . DISCONTD: alum & mag hydroxide-simeth (MAALOX/MYLANTA) 200-200-20 MG/5ML suspension 30 mL  30 mL Oral Q4H PRN Charyl Dancer, PHARMD      . DISCONTD: amLODipine (NORVASC) tablet 10 mg  10 mg Oral Daily Marinda Elk, MD   10 mg at 02/09/11 1247  . DISCONTD: aspirin chewable tablet 81 mg  81 mg Oral Daily Charyl Dancer, PHARMD   81 mg at 02/09/11 1247  . DISCONTD: cloNIDine (CATAPRES) tablet 0.1 mg  0.1 mg Oral BID Marinda Elk, MD   0.1 mg at 02/09/11 1247  . DISCONTD: diphenhydrAMINE (BENADRYL) capsule  50 mg  50 mg Oral QHS PRN Charyl Dancer, PHARMD   50 mg at 02/04/11 2222  . DISCONTD: divalproex (DEPAKOTE ER) 24 hr tablet 500 mg  500 mg Oral QHS Viviann Spare, NP   500 mg at 02/07/11 2304  . DISCONTD: divalproex (DEPAKOTE ER) 24 hr tablet 750 mg  750 mg Oral Daily Katheren Puller, MD   750 mg at 02/08/11 0826  . DISCONTD: enoxaparin (LOVENOX) injection 40 mg  40 mg Subcutaneous Q24H Peter Le   40 mg at 02/09/11 1246  . DISCONTD: glipiZIDE (GLUCOTROL XL) 24 hr tablet 5 mg  5 mg Oral Q breakfast Marinda Elk, MD   5 mg at 02/08/11 0825  . DISCONTD: glipiZIDE (GLUCOTROL XL) 24 hr tablet 5 mg  5 mg Oral QAC breakfast Charyl Dancer, PHARMD   5 mg at 02/09/11 1247  . DISCONTD: insulin aspart (novoLOG) injection 0-15 Units  0-15 Units Subcutaneous TID WC Viviann Spare, NP   3 Units at 02/08/11 0734  . DISCONTD: insulin aspart (novoLOG) injection 0-15 Units  0-15 Units Subcutaneous TID [facility] Charyl Dancer, [place]   3 Units at 02/08/11 1152  . DISCONTD: insulin aspart (novoLOG) injection 3 Units  3 Units Subcutaneous TID WC Viviann Spare, NP   3 Units at 02/09/11 1248  . DISCONTD: lisinopril (PRINIVIL,ZESTRIL) tablet 10 mg  10 mg Oral Daily Marinda Elk, MD   10 mg at 02/09/11 1247  . DISCONTD: LORazepam (ATIVAN) tablet 2 mg  2 mg Oral Q6H PRN Charyl Dancer, PHARMD   2 mg at 02/09/11 1246  . DISCONTD: magnesium hydroxide (MILK OF MAGNESIA) suspension 30 mL  30 mL Oral Daily PRN Charyl Dancer, PHARMD      . DISCONTD: metFORMIN (GLUCOPHAGE) tablet 1,000 mg  1,000 mg Oral BID WC Marinda Elk, MD   1,000 mg at 02/09/11 1246  . DISCONTD: ondansetron (ZOFRAN) injection 4 mg  4 mg Intravenous Q8H PRN Laray Anger, DO      . DISCONTD: risperiDONE (RISPERDAL) tablet 2 mg  2 mg Oral BID Katheren Puller, MD   2 mg at 02/08/11 0830  . DISCONTD: risperiDONE (RISPERDAL) tablet 3 mg  3 mg Oral BID Katheren Puller, MD   3 mg at 02/09/11 1244    Medications Prior to Admission  Medication Sig Dispense Refill  . amLODipine (NORVASC) 10 MG tablet Take 1 tablet (10 mg total) by mouth daily.      Marland Kitchen aspirin 81 MG chewable tablet Chew 1 tablet (81  mg total) by mouth daily.      . cloNIDine (CATAPRES) 0.1 MG tablet Take 1 tablet (0.1 mg total) by mouth 2 (two) times daily.  60 tablet  0  . glipiZIDE (GLUCOTROL XL) 5 MG 24 hr tablet Take 1 tablet (5 mg total) by mouth daily before breakfast.      . insulin aspart (NOVOLOG) 100 UNIT/ML injection Inject 0-15 Units into the skin 3 (three) times daily with meals.  1 vial  0  . insulin aspart (NOVOLOG) 100 UNIT/ML injection Inject 3 Units into the skin 3 (three) times daily with meals.  1 vial  0  . lisinopril (PRINIVIL,ZESTRIL) 10 MG tablet Take 1 tablet (10 mg total) by mouth daily.      Marland Kitchen LORazepam (ATIVAN) 2 MG tablet Take 1 tablet (2 mg total) by mouth every 6 (six) hours as needed for anxiety (and agitation).  30 tablet    . metFORMIN (GLUCOPHAGE) 1000 MG tablet Take 1 tablet (1,000 mg total) by mouth 2 (two) times daily with a meal.      . risperiDONE (RISPERDAL) 3 MG tablet Take 1 tablet (3 mg total) by mouth 2 (two) times daily.        OB/GYN Status:  No LMP for male patient.  General Assessment Data Assessment Number:  (1) Living Arrangements: Other (Comment) (unknown) Can pt return to current living arrangement?:  (unknown) Admission Status: Voluntary Is patient capable of signing voluntary admission?: Yes Transfer from: Acute Hospital Referral Source: Medical Floor Inpatient  Risk to self Suicidal Ideation: No-Not Currently/Within Last 6 Months Suicidal Intent: No Is patient at risk for suicide?: No Suicidal Plan?: No Access to Means: No What has been your use of drugs/alcohol within the last 12 months?:  (unknown) Other Self Harm Risks:  (no) Triggers for Past Attempts: None known Intentional Self Injurious Behavior: None Family Suicide History: See progress  notes Recent stressful life event(s):  (unknown) Depression: No Substance abuse history and/or treatment for substance abuse?: No Suicide prevention information given to non-admitted patients: Not applicable  Risk to Others Homicidal Ideation: No Thoughts of Harm to Others: No Current Homicidal Intent: No Current Homicidal Plan: No Access to Homicidal Means: No Identified Victim:  (none) History of harm to others?: No Assessment of Violence: None Noted Violent Behavior Description:  (none reported) Does patient have access to weapons?: No Criminal Charges Pending?: No  Mental Status Report Appear/Hygiene: Disheveled Eye Contact: Fair Motor Activity:  (normal) Speech: Other (Comment) (normal rate) Level of Consciousness: Other (Comment) (Seems to be internally preoccupied) Mood: Other (Comment) (I am feeling okay as per patient) Affect: Other (Comment) (Full range) Anxiety Level: None Thought Processes:  (Still not logical amd goal-directed per MD assessment) Judgement: Impaired Orientation: Other (Comment) (full) Obsessive Compulsive Thoughts/Behaviors: Severe  Cognitive Functioning Concentration: Decreased Memory: Recent Intact IQ: Average Insight:  (shallow) Impulse Control: Poor Appetite: Fair (unable to establish at this time) Weight Loss:  (none reported) Weight Gain:  (none reported) Sleep: Decreased Total Hours of Sleep:  (decreased hours) Vegetative Symptoms: None  Prior Inpatient/Outpatient Therapy Prior Therapy: Inpatient Prior Therapy Dates:  (October and NOvember 2012) Prior Therapy Facilty/Provider(s):  ([facility]) Reason for Treatment:  (Bipolar and schizoaffective disorder)  ADL Screening (condition at time of admission) Patient's cognitive ability adequate to safely complete daily activities?: No (at times he is cognitive) Patient able to express need for assistance with ADLs?: Yes (at times) Independently performs ADLs?: Yes  (at times needs assistance) Weakness of  Legs: None Weakness of Arms/Hands: None  Home Assistive Devices/Equipment Home Assistive Devices/Equipment: None  Therapy Consults (therapy consults require a physician order) PT Evaluation Needed: No (not at this time) OT Evalulation Needed: No (not at this time) SLP Evaluation Needed: No (not at this time) Abuse/Neglect Assessment (Assessment to be complete while patient is alone) Physical Abuse: Denies Verbal Abuse: Denies Sexual Abuse: Denies Exploitation of patient/patient's resources: Denies Self-Neglect: Denies Values / Beliefs Cultural Requests During Hospitalization: None Spiritual Requests During Hospitalization: None Consults Spiritual Care Consult Needed: No Social Work Consult Needed: No Merchant navy officer (For Healthcare) Advance Directive: Patient does not have advance directive;Patient would not like information Pre-existing out of facility DNR order (yellow form or pink MOST form): No Nutrition Screen Diet: Carb modified Unintentional weight loss greater than 10lbs within the last month: No Dysphagia: No Home Tube Feeding or Total Parenteral Nutrition (TPN): No Patient appears severely malnourished: No Pregnant or Lactating: No Dietitian Consult Needed: No  Additional Information 1:1 In Past 12 Months?: No CIRT Risk: No Elopement Risk: No Does patient have medical clearance?: Yes     Disposition:  Disposition Disposition of Patient: Inpatient treatment program (Patient was accepted by Rogers Blocker to [facility], inpatient tx)  On Site Evaluation by:   Reviewed with Physician:     Tennis Must 02/12/2011 4:49 PM

## 2011-02-13 ENCOUNTER — Inpatient Hospital Stay (HOSPITAL_COMMUNITY)
Admission: AD | Admit: 2011-02-13 | Discharge: 2011-02-17 | DRG: 885 | Disposition: A | Discharge status: Nursing Home (Includes ICF) | Payer: Medicare Other | Source: Ambulatory Visit | Attending: Psychiatry | Admitting: Psychiatry

## 2011-02-13 DIAGNOSIS — Z7982 Long term (current) use of aspirin: Secondary | ICD-10-CM

## 2011-02-13 DIAGNOSIS — Z79899 Other long term (current) drug therapy: Secondary | ICD-10-CM

## 2011-02-13 DIAGNOSIS — I10 Essential (primary) hypertension: Secondary | ICD-10-CM

## 2011-02-13 DIAGNOSIS — Z9119 Patient's noncompliance with other medical treatment and regimen: Secondary | ICD-10-CM

## 2011-02-13 DIAGNOSIS — E871 Hypo-osmolality and hyponatremia: Secondary | ICD-10-CM

## 2011-02-13 DIAGNOSIS — IMO0001 Reserved for inherently not codable concepts without codable children: Secondary | ICD-10-CM

## 2011-02-13 DIAGNOSIS — F29 Unspecified psychosis not due to a substance or known physiological condition: Secondary | ICD-10-CM

## 2011-02-13 DIAGNOSIS — E1165 Type 2 diabetes mellitus with hyperglycemia: Secondary | ICD-10-CM

## 2011-02-13 DIAGNOSIS — Z91199 Patient's noncompliance with other medical treatment and regimen due to unspecified reason: Secondary | ICD-10-CM

## 2011-02-13 DIAGNOSIS — F259 Schizoaffective disorder, unspecified: Principal | ICD-10-CM

## 2011-02-13 LAB — GLUCOSE, CAPILLARY
Glucose-Capillary: 110 mg/dL — ABNORMAL HIGH (ref 70–99)
Glucose-Capillary: 85 mg/dL (ref 70–99)
Glucose-Capillary: 105 mg/dL — ABNORMAL HIGH (ref 70–99)

## 2011-02-13 MED ORDER — ASPIRIN 81 MG PO CHEW: 81.0000 mg | CHEWABLE_TABLET | Freq: Every day | ORAL | Status: DC

## 2011-02-13 MED ORDER — INSULIN ASPART 100 UNIT/ML ~~LOC~~ SOLN: 3.0000 [IU] | Freq: Three times a day (TID) | SUBCUTANEOUS | Status: DC

## 2011-02-13 MED ORDER — METFORMIN HCL 500 MG PO TABS
1000.0000 mg | ORAL_TABLET | Freq: Two times a day (BID) | ORAL | Status: DC
  Administered 2011-02-13 – 2011-02-17 (×9): 1000 mg via ORAL
  Filled 2011-02-13 (×4): qty 2
  Filled 2011-02-13: qty 20
  Filled 2011-02-13: qty 2
  Filled 2011-02-13: qty 20
  Filled 2011-02-13 (×5): qty 2

## 2011-02-13 MED ORDER — METFORMIN HCL 500 MG PO TABS: 1000.0000 mg | ORAL_TABLET | Freq: Two times a day (BID) | ORAL | Status: DC

## 2011-02-13 MED ORDER — INSULIN ASPART 100 UNIT/ML ~~LOC~~ SOLN
3.0000 [IU] | Freq: Three times a day (TID) | SUBCUTANEOUS | Status: DC
  Administered 2011-02-14 – 2011-02-17 (×7): 3 [IU] via SUBCUTANEOUS

## 2011-02-13 MED ORDER — AMLODIPINE BESYLATE 10 MG PO TABS
10.0000 mg | ORAL_TABLET | Freq: Every day | ORAL | Status: DC
  Administered 2011-02-14 – 2011-02-17 (×4): 10 mg via ORAL
  Filled 2011-02-13 (×3): qty 1
  Filled 2011-02-13: qty 5
  Filled 2011-02-13 (×3): qty 1

## 2011-02-13 MED ORDER — CLONIDINE HCL 0.1 MG PO TABS
0.1000 mg | ORAL_TABLET | Freq: Two times a day (BID) | ORAL | Status: DC
  Administered 2011-02-13 – 2011-02-17 (×8): 0.1 mg via ORAL
  Filled 2011-02-13 (×5): qty 1
  Filled 2011-02-13: qty 5
  Filled 2011-02-13 (×3): qty 1
  Filled 2011-02-13: qty 5
  Filled 2011-02-13: qty 1

## 2011-02-13 MED ORDER — LISINOPRIL 10 MG PO TABS: 10.0000 mg | ORAL_TABLET | Freq: Every day | ORAL | Status: DC

## 2011-02-13 MED ORDER — GLIPIZIDE ER 5 MG PO TB24
5.0000 mg | ORAL_TABLET | Freq: Every day | ORAL | Status: DC
  Administered 2011-02-14 – 2011-02-17 (×4): 5 mg via ORAL
  Filled 2011-02-13: qty 1
  Filled 2011-02-13: qty 5
  Filled 2011-02-13 (×4): qty 1

## 2011-02-13 MED ORDER — RISPERIDONE 3 MG PO TABS: 3.0000 mg | ORAL_TABLET | Freq: Two times a day (BID) | ORAL | Status: DC

## 2011-02-13 MED ORDER — AMLODIPINE BESYLATE 10 MG PO TABS: 10.0000 mg | ORAL_TABLET | Freq: Every day | ORAL | Status: DC

## 2011-02-13 MED ORDER — GLIPIZIDE ER 5 MG PO TB24: 5.0000 mg | ORAL_TABLET | Freq: Every day | ORAL | Status: DC

## 2011-02-13 MED ORDER — LORAZEPAM 1 MG PO TABS: 2.0000 mg | ORAL_TABLET | Freq: Four times a day (QID) | ORAL | Status: DC | PRN

## 2011-02-13 MED ORDER — RISPERIDONE 3 MG PO TABS
3.0000 mg | ORAL_TABLET | Freq: Two times a day (BID) | ORAL | Status: DC
  Administered 2011-02-13 – 2011-02-15 (×4): 3 mg via ORAL
  Filled 2011-02-13 (×5): qty 1

## 2011-02-13 MED ORDER — ASPIRIN 81 MG PO CHEW
81.0000 mg | CHEWABLE_TABLET | Freq: Every day | ORAL | Status: DC
  Administered 2011-02-14 – 2011-02-17 (×4): 81 mg via ORAL
  Filled 2011-02-13: qty 1
  Filled 2011-02-13: qty 5
  Filled 2011-02-13 (×3): qty 1
  Filled 2011-02-13: qty 5

## 2011-02-13 MED ORDER — CLONIDINE HCL 0.1 MG PO TABS: 0.1000 mg | ORAL_TABLET | Freq: Two times a day (BID) | ORAL | Status: DC

## 2011-02-13 MED ORDER — LISINOPRIL 10 MG PO TABS
10.0000 mg | ORAL_TABLET | Freq: Every day | ORAL | Status: DC
  Administered 2011-02-14 – 2011-02-17 (×4): 10 mg via ORAL
  Filled 2011-02-13 (×3): qty 1
  Filled 2011-02-13: qty 5
  Filled 2011-02-13: qty 1

## 2011-02-13 MED ORDER — INSULIN ASPART 100 UNIT/ML ~~LOC~~ SOLN: 0.0000 [IU] | Freq: Three times a day (TID) | SUBCUTANEOUS | Status: DC

## 2011-02-13 MED ORDER — INSULIN ASPART 100 UNIT/ML ~~LOC~~ SOLN
0.0000 [IU] | Freq: Three times a day (TID) | SUBCUTANEOUS | Status: DC
  Administered 2011-02-14: 2 [IU] via SUBCUTANEOUS
  Administered 2011-02-17: 3 [IU] via SUBCUTANEOUS

## 2011-02-13 MED ORDER — LORAZEPAM 1 MG PO TABS
2.0000 mg | ORAL_TABLET | Freq: Four times a day (QID) | ORAL | Status: DC | PRN
  Administered 2011-02-16: 2 mg via ORAL
  Filled 2011-02-13: qty 2

## 2011-02-13 NOTE — Progress Notes (Signed)
Spoke with Tim at [facility].  Facility ready for Pt.  Per Dr. Darnelle Catalan, Pt ready for D/c.  Pt signed [facility] consents.  Pt to be D/c'd to [facility].  Providence Crosby, LCSWA Clinical Social Work [phone]

## 2011-02-13 NOTE — Progress Notes (Signed)
  Pt. Readmitted to 400 hall with a Dx. Of Schizoeffective D.O. He is talking constantly and thinks he is [employer].He can not stay still for a minute and makes little sense when you try to talk to him .Will follow.

## 2011-02-13 NOTE — Progress Notes (Signed)
Writer introduced self to patient as his nurse for the evening. Pt reported having had a good day but requested something to eat because pt reported that he was  Asleep during the time snack was given out. Pt. was given a meal and a diet soda, pt was pleasant and reported that he had no pain, -SI/HI/A/V hall. Pt. Was informed of his prn of Ativan available if needed. Safety maintained on unit, will continue to monitor.

## 2011-02-13 NOTE — Progress Notes (Signed)
  50 yo AAM originally admitted IVC on 10/29. Was active psychotic with religious preoccupations. Required jnpatient for medical support of hyponatremia 11/13-11/15. Is returning to [facility] for further stabliization of Schizoaffective Disorder Dr Rogers Blocker is his provider here and saw him in consult while on the medical unit. He wrote orders which will be continued here.  MSE: well dressed as he always is. Mood is irritable -thought he was going home.            Denies SI/HI/AVH  But appears to be somewhat responsive to internal stimuli.

## 2011-02-14 LAB — GLUCOSE, CAPILLARY: Glucose-Capillary: 71 mg/dL (ref 70–99)

## 2011-02-14 NOTE — Progress Notes (Signed)
  Pt was seen and I agreed with the key elements of H&P and treatment plan dated 02/14/11.

## 2011-02-14 NOTE — Progress Notes (Signed)
Pt. attended and participated in aftercare planning group. [facility] support group information was given as well as information on suicide prevention information, warning signs to look for with suicide and crisis line numbers to use. 

## 2011-02-14 NOTE — Progress Notes (Signed)
Nursing: Pt. Continues to be guarded, delusional, hypervigilant and isolative. Denies lethality and A/V/H's.  Pt. Seen in the hallway outside the DR, away from peers.  Remains quiet at this time.

## 2011-02-14 NOTE — Progress Notes (Signed)
Suicide Risk Assessment  Admission Assessment     Demographic factors:  Assessment Details Time of Assessment: Admission Information Obtained From: Patient Current Mental Status:   denies si, intact awarness into his problems Loss Factors:   not working Historical Factors:  No si attempts in past Risk Reduction Factors:  Risk Reduction Factors: Positive therapeutic relationship. Good family support  CLINICAL FACTORS:   Schizophrenia:   Paranoid or undifferentiated type  COGNITIVE FEATURES THAT CONTRIBUTE TO RISK:  Loss of executive function    SUICIDE RISK:   Minimal: No identifiable suicidal ideation.  Patients presenting with no risk factors but with morbid ruminations; may be classified as minimal risk based on the severity of the depressive symptoms  PLAN OF CARE: Continue to observe Start meds and group therapy  Mitchell [name] 02/14/2011, 11:53 AM

## 2011-02-14 NOTE — Progress Notes (Signed)
Pt has been up and has been active in milieu throughout the day today, able to participate to some degree in group settings, pt labile and easily agitated at other peers in milieu as well as various staff. Pt delusional with various flight of ideas, pt did receive medications today without incident and able to respond appropriately to re-direction, support provided, will continue to monitor.

## 2011-02-14 NOTE — BH Assessment (Signed)
Adult Psychosocial Assessment Update Interdisciplinary Team  Previous [facility] admissions/discharges:  Admissions Discharges  Date: 01/25/11 Date:02/09/11  Date: Date:  Date: Date:  Date: Date:  Date: Date:   Changes since the last Psychosocial Assessment (including adherence to outpatient mental health and/or substance abuse treatment, situational issues contributing to decompensation and/or relapse).   Pt. reports that his medications were not working.             Discharge Plan 1. Will you be returning to the same living situation after discharge?   Yes: No:x      If no, what is your plan? Pt states he will not be returning but states he does not need any help finding a place to live           2. Would you like a referral for services when you are discharged? Yes:     If yes, for what services?  No: x             Summary and Recommendations (to be completed by the evaluator) Pt. Is a 50 year old male admitted for Schizoaffective disorder. Pt. Was last admitted to [facility] on 01/25/11 and discharged on 02/09/11. Pt. Was brought to hospital from the ER room. Pt. states he does not have doctor or therapist and does not want a referral. Pt. Recommendations include: Crisis Stabilization, Case management, Group therapy, and Medication management.                       Signature:  Neila Gear, 02/14/2011 9:52 AM

## 2011-02-14 NOTE — Progress Notes (Signed)
[facility] Adult Inpatient Family/Significant Other Suicide Prevention Education  Suicide Prevention Education:  Patient Refusal for Family/Significant Other Suicide Prevention Education: The patient Mitchell [name] has refused to provide written consent for family/significant other to be provided Family/Significant Other Suicide Prevention Education during admission and/or prior to discharge.  Physician notified.  Mitchell [name] 02/14/2011, 10:09 AM

## 2011-02-15 DIAGNOSIS — F259 Schizoaffective disorder, unspecified: Principal | ICD-10-CM

## 2011-02-15 LAB — GLUCOSE, CAPILLARY
Glucose-Capillary: 96 mg/dL (ref 70–99)
Glucose-Capillary: 88 mg/dL (ref 70–99)

## 2011-02-15 MED ORDER — BENZTROPINE MESYLATE 1 MG PO TABS
1.0000 mg | ORAL_TABLET | Freq: Two times a day (BID) | ORAL | Status: DC
  Administered 2011-02-15 – 2011-02-17 (×4): 1 mg via ORAL
  Filled 2011-02-15 (×2): qty 1
  Filled 2011-02-15: qty 5
  Filled 2011-02-15 (×3): qty 1
  Filled 2011-02-15: qty 5
  Filled 2011-02-15: qty 1

## 2011-02-15 MED ORDER — HALOPERIDOL 5 MG PO TABS
10.0000 mg | ORAL_TABLET | Freq: Every day | ORAL | Status: DC
  Administered 2011-02-15 – 2011-02-16 (×2): 10 mg via ORAL
  Filled 2011-02-15 (×3): qty 2
  Filled 2011-02-15 (×2): qty 10

## 2011-02-15 NOTE — Tx Team (Signed)
Interdisciplinary Treatment Plan Update (Adult)  Date:  02/15/2011  Time Reviewed:  10:04 AM   Progress in Treatment: Attending groups: Yes.  Sporadic attendance Participating in groups:  Yes.  Answers questions when asked, but also has some inappropriate intrusions Taking medication as prescribed:  Yes. and As evidenced by:  no refusals Tolerating medication:  Yes. and As evidenced by:  no side effects noted or reported once Depakote discontinued Family/Significant othe contact made:  No, will contact:  patient refuses contact, and when he had earlier agreed, did not have phone numbers Patient understands diagnosis:  No. and As evidenced by:  no insight, poor judgment Discussing patient identified problems/goals with staff:  Yes. and As evidenced by:  answers questions pleasantly, but irrelevant answers  Medical problems stabilized or resolved:  Yes. and As evidenced by:  has been returned to [facility] from the medical unit because was medically cleared to do so Denies suicidal/homicidal ideation: Yes. Issues/concerns per patient self-inventory:  No. Other:  New problem(s) identified: No, Describe:  no new problems  Reason for Continuation of Hospitalization: Delusions  Hallucinations Medication stabilization Other; describe placement issues, agitation  Interventions implemented related to continuation of hospitalization:  Medication monitoring and adjustment, safety checks Q15 min., group therapy, psychoeducation, collateral contact  Additional comments:  Not applicable  Estimated length of stay:  5-7 days  Discharge Plan:  Unknown where will live, [place] [address]  New goal(s):  Not applicable  Review of initial/current patient goals per problem list:   1.  Goal(s):  Reduce psychotic symptoms to baseline  Met:  No  Target date:  By Discharge   As evidenced by:  Remains delusional, lacks insight  2.  Goal (s):  Determine where patient  will live at discharge  Met:  No  Target date:  By Discharge   As evidenced by:  Patient cannot engage in meaningful conversation re planning where he will live, doctor recommended to him that he go to a group home, he was not happy with this suggestion because does not want      Attendees: Patient:  Mitchell [name]  02/15/2011  10:04 AM   Family:   Physician:  Dr. Gwenyth Bouillon Ahluwalia 02/15/2011  10:04 AM   Nursing:   Tacy Learn, RN 02/15/2011  10:04 AM   Case Manager:  Ambrose Mantle, LCSW 02/15/2011  10:04 AM   Counselor:  Veto Kemps, MT-BC 02/15/2011  10:04 AM   Other:  Robbie Louis, RN 02/15/2011  10:04 AM   Other:     Other:     Other:      Scribe for Treatment Team:   [name], 02/15/2011, 10:04 AM

## 2011-02-15 NOTE — H&P (Signed)
50 yo AAM originally admitted IVC on 10/29. Was active psychotic with religious preoccupations. Required jnpatient for medical support of hyponatremia 11/13-11/15.  Is returning to [facility] for further stabliization of Schizoaffective Disorder  Dr Rogers Blocker is his provider here and saw him in consult while on the medical unit. He wrote orders which will be continued here.  MSE: well dressed as he always is. Mood is irritable -thought he was going home.  Denies SI/HI/AVH But appears to be somewhat responsive to internal stimuli.

## 2011-02-15 NOTE — Progress Notes (Signed)
Recreation Therapy Group Note  Date: 02/15/2011         Time: 0930      Group Topic/Focus: The focus of this group is on promoting emotional and psychological well-being through the process of creative expression, relaxation, socialization, fun and enjoyment.   Participation Level: Active  Participation Quality: Appropriate  Affect: Appropriate   Cognitive: Confused   Additional Comments: Patient very calm, cooperative, was the only patient to participate in group. 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 Patient continues to believe he is the president, is preoccupied with the medical scanners (that scan wristbands for medication administration) and believes that because he was scanned today he will be discharged.  Mitchell [name] 02/15/2011 12:52 PM

## 2011-02-15 NOTE — Progress Notes (Signed)
  Mitchell [name] is a 50 y.o. male [id number] [birth year]  Subjective/Objective:  Patient is still very disorganized unable to clearly comprehend things. Patient has poor insight and judgment about his illness. Patient has no family support in the outpatient setting he lives by himself and is noncompliant with his medications and followup with primary care physician. Because of his noncompliance the patient will need IM medication I will change risperdal to Haldol. I also discussed with him about going to the group home patient agrees with this plan at this time.  Filed Vitals:   02/15/11 0705  BP: 139/81  Pulse: 85  Temp:   Resp:     Lab Results:   BMET    Component Value Date/Time   NA 131* 02/11/2011 0518   K 4.4 02/11/2011 0518   CL 96 02/11/2011 0518   CO2 27 02/11/2011 0518   GLUCOSE 122* 02/11/2011 0518   BUN 14 02/11/2011 0518   CREATININE 0.97 02/11/2011 0518   CALCIUM 9.1 02/11/2011 0518   GFRNONAA >90 02/11/2011 0518   GFRAA >90 02/11/2011 0518    Medications:  Scheduled:     . amLODipine  10 mg Oral Daily  . aspirin  81 mg Oral Daily  . benztropine  1 mg Oral BID  . cloNIDine  0.1 mg Oral BID  . glipiZIDE  5 mg Oral QAC breakfast  . haloperidol  10 mg Oral QHS  . insulin aspart  0-15 Units Subcutaneous TID WC  . insulin aspart  3 Units Subcutaneous TID WC  . lisinopril  10 mg Oral Daily  . metFORMIN  1,000 mg Oral BID WC  . DISCONTD: risperiDONE  3 mg Oral BID     PRN Meds LORazepam   Mitchell [name] S MD 02/15/2011

## 2011-02-15 NOTE — Discharge Planning (Signed)
Met with patient in Aftercare Planning Group, where he remained pleasant but also was intrusive throughout, interrupting constantly to say things that Case Manager did not comprehend.  Was delusional about his daughter being one of the workers on the hall, said he could not recall her name.  When Case Manager told him we really need to speak with someone in his life to ascertain that he will have a safe place to live at discharge, he said that we could talk to this "daughter".  Recounted in some detail a house where he will live at discharge, but it is not the same location that he lived prior to admission, and there is no evidence that he has had the means to put this in place during his hospitalization on the medical unit.  Remains religiously preoccupied.  Well dressed and groomed, but significant delusions and inappropriate intrusions.  Per State Regulation 482.30  This chart was reviewed for medical necessity with respect to the patient's Admission/Duration of stay.   Next review due: 02/18/11  Mitchell Mantle, LCSW 02/15/2011, 10:12 AM

## 2011-02-15 NOTE — Progress Notes (Signed)
Pt has been up and has been active while in the milieu today, has been attending various milieu activities, pt pleasant, delusional, stated that he had a place to go to and stated that it was a little white house that he could go to, pt also in a round about way stated that he did not need to take medications after discharge because the hospital said he did not have diabetes or high blood pressure, pt did receive medications today without incident, support provided, will continue to monitor.

## 2011-02-16 LAB — GLUCOSE, CAPILLARY
Glucose-Capillary: 98 mg/dL (ref 70–99)
Glucose-Capillary: 125 mg/dL — ABNORMAL HIGH (ref 70–99)

## 2011-02-16 MED ORDER — HALOPERIDOL DECANOATE 100 MG/ML IM SOLN
100.0000 mg | INTRAMUSCULAR | Status: DC
  Administered 2011-02-16: 100 mg via INTRAMUSCULAR
  Filled 2011-02-16: qty 1

## 2011-02-16 NOTE — Progress Notes (Signed)
  Mitchell [name] is a 50 y.o. male [id number] [birth year]  Subjective/Objective:  Patient is still disorganized and have poor insight both regarding his psych medications and medications for high blood pressure and diabetes. The treatment team has decided to look for a group home as patient is danger to himself without support system in the outpatient setting. Patient is calm and pleasant on the unit he is not giving any trouble to the staff. I will give Haldol Decanoate 100 mg per day it will be better for patient because of his noncompliance and poor insight into his illness. Upto now he has tolerated Haldol well.  Filed Vitals:   02/16/11 0827  BP: 136/86  Pulse: 82  Temp: 97.7 F (36.5 C)  Resp: 18    Lab Results:   BMET    Component Value Date/Time   NA 131* 02/11/2011 0518   K 4.4 02/11/2011 0518   CL 96 02/11/2011 0518   CO2 27 02/11/2011 0518   GLUCOSE 122* 02/11/2011 0518   BUN 14 02/11/2011 0518   CREATININE 0.97 02/11/2011 0518   CALCIUM 9.1 02/11/2011 0518   GFRNONAA >90 02/11/2011 0518   GFRAA >90 02/11/2011 0518    Medications:  Scheduled:     . amLODipine  10 mg Oral Daily  . aspirin  81 mg Oral Daily  . benztropine  1 mg Oral BID  . cloNIDine  0.1 mg Oral BID  . glipiZIDE  5 mg Oral QAC breakfast  . haloperidol  10 mg Oral QHS  . insulin aspart  0-15 Units Subcutaneous TID WC  . insulin aspart  3 Units Subcutaneous TID WC  . lisinopril  10 mg Oral Daily  . metFORMIN  1,000 mg Oral BID WC     PRN Meds LORazepam   AHLUWALIA,SHAMSHER S MD 02/16/2011

## 2011-02-16 NOTE — Discharge Planning (Signed)
Patient angry and stated he would not attend Aftercare Planning Group, as we are not letting him to discharge as he desires.  He did come in with 5 minutes left in group.   Case Manager did FL-2 and sent to 8 assisted living facilities.  Will await responses.  Patient is not aware of this action being taken, and continues to insist that he can discharge to his home in the community, but we are not being told who he will live with, and who we can contact to verify his safety.  Ambrose Mantle, LCSW 02/16/2011, 12:05 PM

## 2011-02-16 NOTE — Progress Notes (Signed)
[facility] Group Notes:  (Counselor/Nursing/MHT/Case Management/Adjunct)  02/16/2011 12:27 PM  Type of Therapy:  Group Therapy  Participation Level:  Minimal  Participation Quality:  Inattentive and Redirectable  Affect:  Labile  Cognitive:  Delusional  Insight:  None  Engagement in Group:  Limited  Engagement in Therapy:  Limited  Modes of Intervention:  Orientation, Support and Exploration  Summary of Progress/Problems: Patient was friendly but then became upset as he talked about someone calling where he was going live and telling them not to take his calls. Continues to insist he has a place to stay and for counselor to call and give his fake name of Mitchell [name] or his real name Mitchell [name]. Making gestures while others were talking.   HartisAram Beecham 02/16/2011, 12:27 PM

## 2011-02-16 NOTE — Progress Notes (Signed)
Pt gets upset whenever it is time to get his scheduled meds, "I already had those!" Other than meds, pt is compliant, and making jokes with the staff at times. Pt protested re: taking Haldol Decanoate at 1200 stating, "I'm not crazy, I don't need Haldol, I don't hear voices, are you sure you have the right order????" Pt reassured about meds, went over all meds with pt. Pt's 1200 BS is 82, no SS insulin needed, but will get 3 units Novalog for meal coverage after lunch. Pt writes ineligibly on his Pt Self Inventory Sheet, but denies SI/HI.

## 2011-02-17 LAB — GLUCOSE, CAPILLARY
Glucose-Capillary: 180 mg/dL — ABNORMAL HIGH (ref 70–99)
Glucose-Capillary: 82 mg/dL (ref 70–99)

## 2011-02-17 MED ORDER — BENZTROPINE MESYLATE 1 MG PO TABS: 1.0000 mg | ORAL_TABLET | Freq: Two times a day (BID) | ORAL | Status: DC

## 2011-02-17 MED ORDER — METFORMIN HCL 1000 MG PO TABS: 1000.0000 mg | ORAL_TABLET | Freq: Two times a day (BID) | ORAL | Status: DC

## 2011-02-17 MED ORDER — ASPIRIN 81 MG PO CHEW: 81.0000 mg | CHEWABLE_TABLET | Freq: Every day | ORAL | Status: AC

## 2011-02-17 MED ORDER — GLIPIZIDE ER 5 MG PO TB24: 5.0000 mg | ORAL_TABLET | Freq: Every day | ORAL | Status: DC

## 2011-02-17 MED ORDER — HALOPERIDOL 10 MG PO TABS: 10.0000 mg | ORAL_TABLET | Freq: Every day | ORAL | Status: DC

## 2011-02-17 MED ORDER — CLONIDINE HCL 0.1 MG PO TABS: 0.1000 mg | ORAL_TABLET | Freq: Two times a day (BID) | ORAL | Status: DC

## 2011-02-17 MED ORDER — HALOPERIDOL DECANOATE 100 MG/ML IM SOLN: 100.0000 mg | INTRAMUSCULAR | Status: DC

## 2011-02-17 MED ORDER — LISINOPRIL 10 MG PO TABS: 10.0000 mg | ORAL_TABLET | Freq: Every day | ORAL | Status: DC

## 2011-02-17 MED ORDER — AMLODIPINE BESYLATE 10 MG PO TABS: 10.0000 mg | ORAL_TABLET | Freq: Every day | ORAL | Status: DC

## 2011-02-17 MED ORDER — TUBERCULIN PPD 5 UNIT/0.1ML ID SOLN
5.0000 [IU] | Freq: Once | INTRADERMAL | Status: AC
  Administered 2011-02-17: 5 [IU] via INTRADERMAL

## 2011-02-17 NOTE — Progress Notes (Signed)
Pt has been attending groups. Assisted Living Director out to talk with Onalee Hua today. According to Onalee Hua and CM, he has been accepted into their program. Director will be back at 1500 to p/u pt for discharge. Pt denies SI/HI/voices.

## 2011-02-17 NOTE — Progress Notes (Signed)
[facility] Case Management Discharge Plan:  Will you be returning to the same living situation after discharge: No.  Mitchell [name] is going to [facility] to live Would you like a referral for services when you are discharged:Yes,  referral made to [facility], although [facility] may change this to their own provider, [facility] [place] which visits weekly in the facility Do you have access to transportation at discharge:Yes,  driver from [facility] is coming to pick up Do you have the ability to pay for your medications:Yes,  Medicare and Medicaid.  Additionally, a 5-day supply will be sent with patient to facilitate transition  Interagency Information:   Release of Information has been completed for patient to sign for [facility] and [facility].  Patient to Follow up at:  Follow-up Information    Follow up with [facility] on 02/22/2011. (Appt 9-11AM at walk-in clinic)    Contact information:   [address] [place] [phone]         Patient denies SI/HI:   Yes,  No suicidal ideation throughout hospitalization    Safety Planning and Suicide Prevention discussed:  Yes,  with patient  Barrier to discharge identified:No.  Summary and Recommendations:  Patient has been unable to identify a specific place he would live or people he would live with, other than what appeared to be delusions.  Thus, a referral was made to local assisted living facilities.  [facility] staff has met with patient and they feel he will be a good fit.  He is excited about going there to live, and states now that he does not want to go live in an apartment again.   [name] 02/17/2011, 2:05 PM

## 2011-02-17 NOTE — Discharge Summary (Signed)
Discharge Note  Patient:  Mitchell [name] is an 50 y.o., male DOB:  March 24, 1961  Date of Admission:  02/13/2011  Date of Discharge:  @DISCDT @  Axis Diagnosis:  Axis I: Schizoaffective Disorder  Level of Care:  OP  Discharge destination:  ALF  Is patient on multiple antipsychotic therapies at discharge:  No    Has Patient had three or more failed trials of antipsychotic monotherapy by history:  No  Patient phone:  [phone] (home)  Patient address:   [address] [place] [address],   Follow-up recommendations: Take the recommended medications everyday and followup with you psychiatrist regularly.   Tanisa Lagace S 02/17/2011, 2:21 PM

## 2011-02-17 NOTE — Progress Notes (Signed)
  [facility] Group Notes:  (Counselor/Nursing/MHT/Case Management/Adjunct)  02/17/2011 2:23 PM  Type of Therapy:  Group Therapy  Participation Level:  Minimal  Participation Quality:  Attentive, Monopolizing and Sharing  Affect:  Anxious and Depressed  Cognitive:  Alert and Oriented  Insight:  Limited  Engagement in Group:  Limited  Engagement in Therapy:  Limited  Modes of Intervention:  Activity, Clarification, Problem-solving and Socialization  Summary of Progress/Problems: Pt participate in group by expressing that he was most grateful for his wife that always supported him.  Pt participated in activity by giving positive affirmations to others.  Pt stated he was grateful to be going to assisted living.  Intervention effective.   Marni Griffon C 02/17/2011, 2:23 PM

## 2011-02-17 NOTE — Discharge Summary (Signed)
Physician Discharge Summary  Patient ID: Mitchell [name] MRN: [id number] DOB/AGE: [birth year] 49 y.o.  Admit date: 02/13/2011 Discharge date: 02/17/2011    Discharge Diagnoses: Schizoaffective disorder under remission.   Discharged Condition: Stable.  Hospital Course: [name] with history of schizoaffective disorder who was admitted at [facility] and disorganized and manic phase. Patient was noncompliant with his both psych medications and medical medications for diabetes mellitus and hypertension. She was initially  started on Geodon but he did not responded well to it. Later on he was changed to Risperdal. She responded well to it but because of his noncompliance in the outpatient setting he was started on Haldol and was given Haldol dec 100 mg IM monthly. Patient responded well to this medication. At one time because of his dehydration he was sent to the medical floor for hydration. As patient was noncompliant with his medication and was not taking care of himself and has a poor insight into his illness it was decided he needed a  assisted-living facility in the outpatient setting or a group home, patient agreed to go for assisted living facility.  At the time of discharge patient is logical and goal-directed calm cooperative pleasant. He denies having suicidal or homicidal ideations denies hearing voices is not responding  to inner stimuli his insight and judgment improved. No side effects reported, no EPS present.   Discharge Exam: Blood pressure 127/76, pulse 89, temperature 98.5 F (36.9 C), temperature source Oral, resp. rate 16, height 5\' 7"  (1.702 m), weight 85.276 kg (188 lb), SpO2 98.00%.   Disposition: Assisted living facility  Discharge Orders    Future Orders Please Complete By Expires   Diet - low sodium heart healthy      Scheduling Instructions:   No concentrated sweets, avoid sweets.  May have fresh fruits.   Discharge instructions      Comments:     Follow up with Dr. Clyda Greener MD for your diabetes and hypertension.     Current Discharge Medication List    START taking these medications   Details  benztropine (COGENTIN) 1 MG tablet Take 1 tablet (1 mg total) by mouth 2 (two) times daily. Qty: 60 tablet, Refills: 0    haloperidol (HALDOL) 10 MG tablet Take 1 tablet (10 mg total) by mouth at bedtime. Qty: 30 tablet, Refills: 0    haloperidol decanoate (HALDOL DECANOATE) 100 MG/ML injection Inject 1 mL (100 mg total) into the muscle every 30 (thirty) days. Qty: 1 mL, Refills: 0      CONTINUE these medications which have CHANGED   Details  amLODipine (NORVASC) 10 MG tablet Take 1 tablet (10 mg total) by mouth daily. Qty: 30 tablet, Refills: 1    aspirin 81 MG chewable tablet Chew 1 tablet (81 mg total) by mouth daily. Qty: 30 tablet, Refills: 1    cloNIDine (CATAPRES) 0.1 MG tablet Take 1 tablet (0.1 mg total) by mouth 2 (two) times daily. Qty: 60 tablet, Refills: 1    glipiZIDE (GLUCOTROL XL) 5 MG 24 hr tablet Take 1 tablet (5 mg total) by mouth daily before breakfast. Qty: 30 tablet, Refills: 0    lisinopril (PRINIVIL,ZESTRIL) 10 MG tablet Take 1 tablet (10 mg total) by mouth daily. Qty: 30 tablet, Refills: 1    metFORMIN (GLUCOPHAGE) 1000 MG tablet Take 1 tablet (1,000 mg total) by mouth 2 (two) times daily with a meal. Qty: 60 tablet, Refills: 0      STOP taking these medications  insulin aspart (NOVOLOG) 100 UNIT/ML injection      insulin aspart (NOVOLOG) 100 UNIT/ML injection      LORazepam (ATIVAN) 2 MG tablet      risperiDONE (RISPERDAL) 3 MG tablet        Follow-up Information    Follow up with [facility] on 02/22/2011. (Appt 9-11AM at walk-in clinic)    Contact information:   [address] [place] [phone]         Signed: Katheren [name] 02/17/2011, 2:15 PM

## 2011-02-17 NOTE — Progress Notes (Signed)
Suicide Risk Assessment  Discharge Assessment     Demographic factors:  Assessment Details Time of Assessment: Discharge Information Obtained From: Patient Current Mental Status:    Risk Reduction Factors:  Risk Reduction Factors: Positive social support;Positive coping skills or problem solving skills  CLINICAL FACTORS:   Schizophrenia:   Paranoid or undifferentiated type  COGNITIVE FEATURES THAT CONTRIBUTE TO RISK:  Decrease in executive level of functionality.  SUICIDE RISK:   Minimal: No identifiable suicidal ideation.  Patients presenting with no risk factors but with morbid ruminations; may be classified as minimal risk based on the severity of the depressive symptoms  PLAN OF CARE: Sea discharge summary.  Sondra Blixt S 02/17/2011, 2:13 PM

## 2011-02-17 NOTE — Tx Team (Signed)
Interdisciplinary Treatment Plan Update (Adult)  Date:  02/17/2011  Time Reviewed:  10:52 AM   Progress in Treatment: Attending groups: Yes. Participating in groups:  Yes. Taking medication as prescribed:  Yes. Tolerating medication:  Yes. Family/Significant othe contact made:  No, will contact:  refuses contact with anyone real, says we can talk to people who are part of delusional system Patient understands diagnosis:  Yes. and As evidenced by:  to some extent, describes diagnosis, has some improvement in insight Discussing patient identified problems/goals with staff:  Yes. Medical problems stabilized or resolved:  Yes. Denies suicidal/homicidal ideation: No. Issues/concerns per patient self-inventory:  No. Other:  New problem(s) identified: Yes, Describe:  Patient has agreed to go to an assisted living facility.  [facility] will interview this afternoon.  If approved, he could go there immediately.  Potential of him not being approved.  Reason for Continuation of Hospitalization: Other; describe Only potential obstacle is placement, currently  Interventions implemented related to continuation of hospitalization:  Medication monitoring and adjustment, safety checks Q15 min., group therapy, psychoeducation, collateral contact  Additional comments:  Not applicable  Estimated length of stay:  D/C today if accepted by ALF  Discharge Plan:  Go to ALF, follow up with their provider and/or [facility]  New goal(s):  Not applicable  Review of initial/current patient goals per problem list:   1.  Goal(s):  Reduce psychotic symptoms to baseline  Met:  Yes  Target date:  By Discharge  As evidenced by:  Patient remains delusional, but this appears to be his baseline 2.  Goal (s):  Determine where patient will live at discharge  Met:  Yes  Target date:  By Discharge   As evidenced by:  Patient has agreed to go to ALF, and one is coming to interview him  today     Attendees: Patient:  Mitchell [name]  02/17/2011  10:52 AM   Family:     Physician:  Dr. Gwenyth Bouillon Ahluwalia 02/17/2011  10:52 AM   Nursing:   Carolynn Comment, RN 02/17/2011  10:52 AM   Case Manager:  Ambrose Mantle, LCSW 02/17/2011  10:52 AM   Counselor:  Marni Griffon, LCAS 02/17/2011  10:52 AM   Other:  Robbie Louis, RN 02/17/2011  10:52 AM   Other:     Other:     Other:      Scribe for Treatment Team:   [name], 02/17/2011, 10:52 AM

## 2011-02-17 NOTE — Progress Notes (Signed)
Pt laying in bed with eyes closed. No distress noted

## 2011-02-17 NOTE — Progress Notes (Signed)
Recreation Therapy Group Note  Date: 02/17/2011         Time: 0930      Group Topic/Focus: The focus of this group is on discussing various styles of communication and communicating assertively using 'I' (feeling) statements.  Participation Level: Active  Participation Quality: Appropriate and Attentive  Affect: Excited  Cognitive: Alert   Additional Comments: Patient pleasant and cooperative, singing his answers to questions at times, reports he will be discharged to an assisted living facility today.  Mayme Profeta 02/17/2011 11:44 AM

## 2011-02-17 NOTE — Progress Notes (Signed)
D. Patient presents with appropriate affect, level mood and stated ready for discharge to go to [facility].  Patient reviewed all discharge medications with Waynetta Sandy, RN and Everlene Balls, RN.  Patient stated understanding all medications, current condition and denied any SI/HI, no plan or intent to harm self or others.  Patient reported no access to firearms.  A. Patient ready for discharge.  No complaints or concerns expressed.  Will follow up with appointment at [facility] on 11/26 and Dr. Bruna Potter for care of diabetes and hypertension.  R. Patient obtained all belongings and reviewed discharge instructions, appointments and medications with patient and staff from [facility].  Patient left with [facility] staff.  Patient stable with no SI/HI.

## 2011-02-19 NOTE — Progress Notes (Signed)
Patient Discharge Instructions:  D/C instructions faxed, Date faxed:  02/19/2011 D/C Summary faxed, Date faxed:  02/19/2011 Med. Rec. Form faxed, Date faxed:  02/19/2011 Facesheet faxed 02/19/2011 D/C Note faxed 02/19/2011  Faxed to [facility] [phone]  Wandra Scot, 02/19/2011, 2:14 PM

## 2011-02-24 ENCOUNTER — Encounter (HOSPITAL_COMMUNITY): Payer: Self-pay | Admitting: Emergency Medicine

## 2011-02-24 ENCOUNTER — Emergency Department (HOSPITAL_COMMUNITY)
Admission: EM | Admit: 2011-02-24 | Discharge: 2011-02-25 | Disposition: A | Discharge status: Home / Self Care | Payer: Medicare Other | Source: Home / Self Care | Attending: Emergency Medicine | Admitting: Emergency Medicine

## 2011-02-24 DIAGNOSIS — E119 Type 2 diabetes mellitus without complications: Secondary | ICD-10-CM | POA: Insufficient documentation

## 2011-02-24 DIAGNOSIS — M25559 Pain in unspecified hip: Secondary | ICD-10-CM | POA: Insufficient documentation

## 2011-02-24 DIAGNOSIS — F319 Bipolar disorder, unspecified: Secondary | ICD-10-CM | POA: Insufficient documentation

## 2011-02-24 DIAGNOSIS — Z79899 Other long term (current) drug therapy: Secondary | ICD-10-CM | POA: Insufficient documentation

## 2011-02-24 DIAGNOSIS — F209 Schizophrenia, unspecified: Secondary | ICD-10-CM | POA: Insufficient documentation

## 2011-02-24 DIAGNOSIS — I10 Essential (primary) hypertension: Secondary | ICD-10-CM | POA: Insufficient documentation

## 2011-02-24 DIAGNOSIS — M25552 Pain in left hip: Secondary | ICD-10-CM
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
## 2011-02-25 ENCOUNTER — Emergency Department (HOSPITAL_COMMUNITY): Payer: Medicare Other

## 2011-02-25 MED ORDER — IBUPROFEN 400 MG PO TABS: 400.0000 mg | ORAL_TABLET | Freq: Three times a day (TID) | ORAL | Status: AC

## 2011-02-25 MED ORDER — IBUPROFEN 800 MG PO TABS
800.0000 mg | ORAL_TABLET | Freq: Once | ORAL | Status: AC
  Administered 2011-02-25: 800 mg via ORAL
  Filled 2011-02-25: qty 1
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
## 2011-02-27 ENCOUNTER — Encounter (HOSPITAL_COMMUNITY): Payer: Self-pay | Admitting: *Deleted

## 2011-02-27 ENCOUNTER — Inpatient Hospital Stay (HOSPITAL_COMMUNITY)
Admission: RE | Admit: 2011-02-27 | Discharge: 2011-03-10 | Disposition: A | Discharge status: Nursing Home (Includes ICF) | Payer: Medicare Other | Source: Ambulatory Visit | Attending: Family Medicine | Admitting: Family Medicine

## 2011-02-27 ENCOUNTER — Emergency Department (HOSPITAL_COMMUNITY)
Admission: EM | Admit: 2011-02-27 | Discharge: 2011-02-27 | Disposition: A | Discharge status: Other Acute Inpatient Hospital | Payer: Medicare Other | Source: Home / Self Care | Attending: Emergency Medicine | Admitting: Emergency Medicine

## 2011-02-27 DIAGNOSIS — I10 Essential (primary) hypertension: Secondary | ICD-10-CM | POA: Diagnosis present

## 2011-02-27 DIAGNOSIS — T4275XA Adverse effect of unspecified antiepileptic and sedative-hypnotic drugs, initial encounter: Secondary | ICD-10-CM | POA: Diagnosis present

## 2011-02-27 DIAGNOSIS — E119 Type 2 diabetes mellitus without complications: Secondary | ICD-10-CM | POA: Diagnosis present

## 2011-02-27 DIAGNOSIS — F259 Schizoaffective disorder, unspecified: Secondary | ICD-10-CM | POA: Diagnosis present

## 2011-02-27 DIAGNOSIS — X789XXA Intentional self-harm by unspecified sharp object, initial encounter: Secondary | ICD-10-CM

## 2011-02-27 DIAGNOSIS — E871 Hypo-osmolality and hyponatremia: Principal | ICD-10-CM | POA: Diagnosis present

## 2011-02-27 DIAGNOSIS — F250 Schizoaffective disorder, bipolar type: Secondary | ICD-10-CM

## 2011-02-27 HISTORY — DX: Hypo-osmolality and hyponatremia: E87.1

## 2011-02-27 LAB — CBC
HCT: 37.6 % — ABNORMAL LOW (ref 39.0–52.0)
MCHC: 34.6 g/dL (ref 30.0–36.0)
RDW: 12.4 % (ref 11.5–15.5)

## 2011-02-27 LAB — COMPREHENSIVE METABOLIC PANEL
ALT: 11 U/L (ref 0–53)
AST: 15 U/L (ref 0–37)
Albumin: 3.5 g/dL (ref 3.5–5.2)
Alkaline Phosphatase: 113 U/L (ref 39–117)
Potassium: 4.5 mEq/L (ref 3.5–5.1)
Sodium: 131 mEq/L — ABNORMAL LOW (ref 135–145)
Total Protein: 7.1 g/dL (ref 6.0–8.3)

## 2011-02-27 LAB — URINALYSIS, ROUTINE W REFLEX MICROSCOPIC
Bilirubin Urine: NEGATIVE
Glucose, UA: NEGATIVE mg/dL
Ketones, ur: NEGATIVE mg/dL
Leukocytes, UA: NEGATIVE
pH: 6.5 (ref 5.0–8.0)

## 2011-02-27 LAB — RAPID URINE DRUG SCREEN, HOSP PERFORMED
Amphetamines: NOT DETECTED
Barbiturates: NOT DETECTED
Benzodiazepines: NOT DETECTED

## 2011-02-27 LAB — GLUCOSE, CAPILLARY
Glucose-Capillary: 105 mg/dL — ABNORMAL HIGH (ref 70–99)
Glucose-Capillary: 128 mg/dL — ABNORMAL HIGH (ref 70–99)

## 2011-02-27 LAB — ETHANOL: Alcohol, Ethyl (B): <11 mg/dL (ref 0–11)

## 2011-02-27 MED ORDER — ACETAMINOPHEN 325 MG PO TABS
650.0000 mg | ORAL_TABLET | Freq: Four times a day (QID) | ORAL | Status: DC | PRN
  Administered 2011-03-07 – 2011-03-09 (×2): 650 mg via ORAL
  Filled 2011-02-27: qty 2

## 2011-02-27 MED ORDER — MAGNESIUM HYDROXIDE 400 MG/5ML PO SUSP: 30.0000 mL | Freq: Every day | ORAL | Status: DC | PRN

## 2011-02-27 MED ORDER — AMLODIPINE BESYLATE 10 MG PO TABS
10.0000 mg | ORAL_TABLET | Freq: Every day | ORAL | Status: DC
  Administered 2011-02-28 – 2011-03-09 (×10): 10 mg via ORAL
  Filled 2011-02-27 (×13): qty 1

## 2011-02-27 MED ORDER — METFORMIN HCL 500 MG PO TABS
1000.0000 mg | ORAL_TABLET | Freq: Two times a day (BID) | ORAL | Status: DC
  Administered 2011-02-28 – 2011-03-09 (×20): 1000 mg via ORAL
  Filled 2011-02-27 (×26): qty 2

## 2011-02-27 MED ORDER — LORAZEPAM 1 MG PO TABS
1.0000 mg | ORAL_TABLET | Freq: Three times a day (TID) | ORAL | Status: DC | PRN
  Administered 2011-02-27: 1 mg via ORAL
  Filled 2011-02-27: qty 1

## 2011-02-27 MED ORDER — ONDANSETRON HCL 4 MG PO TABS: 4.0000 mg | ORAL_TABLET | Freq: Three times a day (TID) | ORAL | Status: DC | PRN

## 2011-02-27 MED ORDER — HALOPERIDOL 5 MG PO TABS
10.0000 mg | ORAL_TABLET | Freq: Every day | ORAL | Status: DC
  Administered 2011-02-27: 10 mg via ORAL
  Filled 2011-02-27 (×2): qty 1
  Filled 2011-02-27 (×2): qty 2

## 2011-02-27 MED ORDER — NICOTINE 21 MG/24HR TD PT24
21.0000 mg | MEDICATED_PATCH | Freq: Every day | TRANSDERMAL | Status: DC
  Filled 2011-02-27: qty 1

## 2011-02-27 MED ORDER — ALUM & MAG HYDROXIDE-SIMETH 200-200-20 MG/5ML PO SUSP: 30.0000 mL | ORAL | Status: DC | PRN

## 2011-02-27 MED ORDER — LISINOPRIL 10 MG PO TABS
10.0000 mg | ORAL_TABLET | Freq: Every day | ORAL | Status: DC
  Administered 2011-02-28 – 2011-03-03 (×4): 10 mg via ORAL
  Filled 2011-02-27 (×6): qty 1

## 2011-02-27 MED ORDER — CLONIDINE HCL 0.1 MG PO TABS
0.1000 mg | ORAL_TABLET | Freq: Two times a day (BID) | ORAL | Status: DC
  Administered 2011-02-27 – 2011-03-09 (×21): 0.1 mg via ORAL
  Filled 2011-02-27 (×24): qty 1

## 2011-02-27 MED ORDER — IBUPROFEN 200 MG PO TABS
400.0000 mg | ORAL_TABLET | Freq: Three times a day (TID) | ORAL | Status: DC
  Administered 2011-02-28 – 2011-03-09 (×28): 400 mg via ORAL
  Filled 2011-02-27 (×4): qty 1
  Filled 2011-02-27: qty 2
  Filled 2011-02-27 (×27): qty 1

## 2011-02-27 MED ORDER — ZOLPIDEM TARTRATE 5 MG PO TABS: 5.0000 mg | ORAL_TABLET | Freq: Every evening | ORAL | Status: DC | PRN

## 2011-02-27 MED ORDER — ACETAMINOPHEN 325 MG PO TABS: 650.0000 mg | ORAL_TABLET | ORAL | Status: DC | PRN

## 2011-02-27 MED ORDER — GLIPIZIDE ER 5 MG PO TB24
5.0000 mg | ORAL_TABLET | Freq: Every day | ORAL | Status: DC
  Administered 2011-02-28 – 2011-03-09 (×10): 5 mg via ORAL
  Filled 2011-02-27 (×13): qty 1

## 2011-02-27 MED ORDER — BENZTROPINE MESYLATE 1 MG PO TABS
1.0000 mg | ORAL_TABLET | Freq: Two times a day (BID) | ORAL | Status: DC
  Administered 2011-02-27 – 2011-03-02 (×6): 1 mg via ORAL
  Filled 2011-02-27 (×10): qty 1

## 2011-02-27 MED ORDER — ASPIRIN 81 MG PO CHEW
81.0000 mg | CHEWABLE_TABLET | Freq: Every day | ORAL | Status: DC
  Administered 2011-02-28 – 2011-03-09 (×10): 81 mg via ORAL
  Filled 2011-02-27 (×12): qty 1

## 2011-02-27 MED ORDER — IBUPROFEN 600 MG PO TABS: 600.0000 mg | ORAL_TABLET | Freq: Three times a day (TID) | ORAL | Status: DC | PRN
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
## 2011-02-27 NOTE — ED Notes (Addendum)
Pt involuntary admission to [facility]. Transport via [employer].

## 2011-02-27 NOTE — Progress Notes (Signed)
Patient ID: Jermery Caratachea, male   DOB: [birth year], 50 y.o.   MRN: [id number] 02/27/2011 1930 This pt is well known to [facility], where he was jut recently discharged  From. He is alert to person and place. HE is a poor historian,in that he can remember some facts ( is that he has a hx of HTN and Diabetes), but can't tell this nurse what meds he took today. He denies known drug allergies. He states he left his place of residence [facility]) at 9pm last night because they were accusing him of things that he felt were unfair accusations and he didn't want to live there any longer. He denies HI and / or SI. He is pleasantly psychotic and religiously focused in that frequently, he will give an answer in a Godlike connotation.  After his search is completed, he is oriented to the unit and escorted to the 400 hall. PD RN [facility]

## 2011-02-27 NOTE — ED Notes (Signed)
Patient moved top room 25 after running out of room and when asked where he was going he std to the [facility] .  Told that he needed to ask prior to to leaving the room

## 2011-02-27 NOTE — ED Notes (Signed)
Pt admitted to psych ED with sx of psychosis, auditory hallucinations and paranoia. Denies SI/HI. 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 Has abrasions to his face that were self-inflicted. Otherwise patient is redirectable and cooperative.

## 2011-02-27 NOTE — ED Notes (Signed)
Report called to wendy, rn

## 2011-02-27 NOTE — ED Provider Notes (Signed)
History     CSN: [id number] Arrival date & time: 02/27/2011  1:03 AM   First MD Initiated Contact with Patient 02/27/11 0224      Chief Complaint  Patient presents with  . Psychiatric Evaluation    (Consider location/radiation/quality/duration/timing/severity/associated sxs/prior treatment) HPI The patient presents with concerns over multiple facial lacerations.  He notes that he was absentmindedly using a tool just prior to admission and accidentally cut his face 20 or 30 times. He denies any pain or intent at self-harm during this activity. Notably, the patient has a history of schizoaffective disorder. The patient currently says that he has no thoughts of suicidal ideation, notes that he has been doing well, taking his medication as directed. However, the patient offers that he is brought about the senior, and tangentially expounds on multiple topics without provocation. Notably he denies any current pain or other complaints. Past Medical History  Diagnosis Date  . Hypertension   . Diabetes mellitus   . Mental disorder   . Bipolar affective disorder   . Schizoaffective disorder     No past surgical history on file.  No family history on file.  History  Substance Use Topics  . Smoking status: Former Smoker    Types: Cigarettes  . Smokeless tobacco: Never Used  . Alcohol Use: No      Review of Systems  Unable to perform ROS: Psychiatric disorder    Allergies  Review of patient's allergies indicates no known allergies.  Home Medications   Current Outpatient Rx  Name Route Sig Dispense Refill  . AMLODIPINE BESYLATE 10 MG PO TABS Oral Take 1 tablet (10 mg total) by mouth daily. 30 tablet 1  . ASPIRIN 81 MG PO CHEW Oral Chew 1 tablet (81 mg total) by mouth daily. 30 tablet 1  . BENZTROPINE MESYLATE 1 MG PO TABS Oral Take 1 tablet (1 mg total) by mouth 2 (two) times daily. 60 tablet 0  . CLONIDINE HCL 0.1 MG PO TABS Oral Take 1 tablet (0.1 mg total) by mouth 2 (two)  times daily. 60 tablet 1  . GLIPIZIDE ER 5 MG PO TB24 Oral Take 1 tablet (5 mg total) by mouth daily before breakfast. 30 tablet 0  . HALOPERIDOL 10 MG PO TABS Oral Take 1 tablet (10 mg total) by mouth at bedtime. 30 tablet 0  . HALOPERIDOL DECANOATE 100 MG/ML IM SOLN Intramuscular Inject 1 mL (100 mg total) into the muscle every 30 (thirty) days. 1 mL 0    100mg  Haldol Decanoate First given 02/17/11, next  ...  . IBUPROFEN 400 MG PO TABS Oral Take 1 tablet (400 mg total) by mouth 3 (three) times daily. 21 tablet 0  . INSULIN REGULAR HUMAN 100 UNIT/ML IJ SOLN Subcutaneous Inject 2-5 Units into the skin 3 (three) times daily before meals. Dependant on blood sugar check.     Marland Kitchen LISINOPRIL 10 MG PO TABS Oral Take 1 tablet (10 mg total) by mouth daily. 30 tablet 1  . METFORMIN HCL 1000 MG PO TABS Oral Take 1 tablet (1,000 mg total) by mouth 2 (two) times daily with a meal. 60 tablet 0    BP 149/81  Pulse 88  Temp(Src) 98.3 F (36.8 C) (Oral)  Resp 20  Ht 5\' 8"  (1.727 m)  Wt 166 lb (75.297 kg)  BMI 25.24 kg/m2  SpO2 100%  Physical Exam  Nursing note and vitals reviewed. Constitutional: He is oriented to person, place, and time. He appears well-developed and well-nourished. No  distress.  HENT:       Scattered about the patient's face are innumerable linear superficial lacerations all scabbed over, none open. The longest is approximately 15 cm in length, there are lesions on the nose, forhead, both cheeks, chin.  Eyes: Conjunctivae and EOM are normal. Pupils are equal, round, and reactive to light.  Neck: Neck supple.  Cardiovascular: Normal rate.   Pulmonary/Chest: Effort normal and breath sounds normal. No stridor.  Abdominal: Soft. He exhibits no distension.  Musculoskeletal: He exhibits no edema and no tenderness.  Neurological: He is alert and oriented to person, place, and time.  Skin: Skin is warm and dry.  Psychiatric:       Tangential speech, delusional thought content, paranoid  thoughts, no suicidal ideation    ED Course  Procedures (including critical care time)  Labs Reviewed - No data to display No results found.   No diagnosis found.    MDM  This 50 year old male with a history of schizoaffective disorder now presents following an episode of self-harm. The patient's denial of intent to hurt himself is contradictory to the number of lacerations on his face. The patient's frank psychosis, his description of being Delila Pereyra Obama's fathPotential speech are all concerning for the patient's inability to take care of himself. However, the patient is awake, alert, cooperative.  The patient's initial labs are all within normal limits and he is cleared for psychiatric evaluation. Given the absence of abnormal vital signs were notable initial labs, a urine tox screen, will not change his medical management.        Gerhard Munch, MD 02/27/11 [phone]

## 2011-02-27 NOTE — BH Assessment (Signed)
Assessment Note   Mitchell [name] is an 50 y.o. male. Pt has long hx of Schizoaffective Disorder and was just treated at [facility] in November by Dr. Rogers Blocker.  Pt reports he lives at [facility] a facility he claims he bought and owns.  Pt also reports he is Mitchell [name] and prefers to be called "Mitchell [name]" by those who care for him.  Pt denies SI, HI and SA at this time.  Pt is cooperative presently and is in agreement with going to [facility] for treatment.  However, pt does not appear to be able to sign self in since he is not oriented to situation or self.  Pt has been accepted to [facility] per Dr. Rogers Blocker but is waiting on bed on 400 hall floor.  ACT will notify MD and ER when pt can be admitted to [facility].  Pt made good eye contact, communicated with ACT, appropriate facial expression.  Pt appeared hyper, pressured speech, loud but redirectable.    Axis I: Schizoaffective Disorder Axis II: No diagnosis Axis III:  Past Medical History  Diagnosis Date  . Hypertension   . Diabetes mellitus   . Mental disorder   . Bipolar affective disorder   . Schizoaffective disorder    Axis IV: problems with primary support group Axis V: 31-40 impairment in reality testing  Past Medical History:  Past Medical History  Diagnosis Date  . Hypertension   . Diabetes mellitus   . Mental disorder   . Bipolar affective disorder   . Schizoaffective disorder     No past surgical history on file.  Family History: No family history on file.  Social History:  reports that he has quit smoking. His smoking use included Cigarettes. He has never used smokeless tobacco. He reports that he does not drink alcohol or use illicit drugs.  Allergies: No Known Allergies  Home Medications:  No current facility-administered medications on file as of 02/27/2011.   Medications Prior to Admission  Medication Sig Dispense Refill  . amLODipine (NORVASC) 10 MG tablet Take 1 tablet (10 mg total) by mouth daily.  30 tablet  1  .  aspirin 81 MG chewable tablet Chew 1 tablet (81 mg total) by mouth daily.  30 tablet  1  . benztropine (COGENTIN) 1 MG tablet Take 1 tablet (1 mg total) by mouth 2 (two) times daily.  60 tablet  0  . cloNIDine (CATAPRES) 0.1 MG tablet Take 1 tablet (0.1 mg total) by mouth 2 (two) times daily.  60 tablet  1  . glipiZIDE (GLUCOTROL XL) 5 MG 24 hr tablet Take 1 tablet (5 mg total) by mouth daily before breakfast.  30 tablet  0  . haloperidol (HALDOL) 10 MG tablet Take 1 tablet (10 mg total) by mouth at bedtime.  30 tablet  0  . haloperidol decanoate (HALDOL DECANOATE) 100 MG/ML injection Inject 1 mL (100 mg total) into the muscle every 30 (thirty) days.  1 mL  0  . ibuprofen (ADVIL,MOTRIN) 400 MG tablet Take 1 tablet (400 mg total) by mouth 3 (three) times daily.  21 tablet  0  . insulin regular (HUMULIN R,NOVOLIN R) 100 units/mL injection Inject 2-5 Units into the skin 3 (three) times daily before meals. Dependant on blood sugar check.       Marland Kitchen lisinopril (PRINIVIL,ZESTRIL) 10 MG tablet Take 1 tablet (10 mg total) by mouth daily.  30 tablet  1  . metFORMIN (GLUCOPHAGE) 1000 MG tablet Take 1 tablet (1,000 mg total)  by mouth 2 (two) times daily with a meal.  60 tablet  0    OB/GYN Status:  No LMP for male patient.  General Assessment Data Living Arrangements: Assisted Living Facility Can pt return to current living arrangement?: Yes Admission Status: Voluntary Is patient capable of signing voluntary admission?: No Transfer from: Acute Hospital Referral Source: MD  Risk to self Suicidal Ideation: No Suicidal Intent: No Is patient at risk for suicide?: No Suicidal Plan?: No Access to Means: No What has been your use of drugs/alcohol within the last 12 months?: 0 Other Self Harm Risks: 0 Triggers for Past Attempts: Unpredictable Intentional Self Injurious Behavior: None Family Suicide History: Unknown Recent stressful life event(s): Conflict (Comment) Persecutory voices/beliefs?:  Yes Depression: No Depression Symptoms: Isolating Substance abuse history and/or treatment for substance abuse?: No Suicide prevention information given to non-admitted patients: Not applicable  Risk to Others Homicidal Ideation: No Thoughts of Harm to Others: No Current Homicidal Intent: No Current Homicidal Plan: No Access to Homicidal Means: No Identified Victim: 0 History of harm to others?: No Assessment of Violence: None Noted Violent Behavior Description: 0 Does patient have access to weapons?: No Criminal Charges Pending?: No Does patient have a court date: No  Mental Status Report Appear/Hygiene: Other (Comment) Eye Contact: Good Motor Activity: Hyperactivity Speech: Pressured;Loud;Tangential Level of Consciousness: Alert Mood: Labile Affect: Labile Anxiety Level: Moderate Thought Processes: Tangential Judgement: Impaired Orientation: Place Obsessive Compulsive Thoughts/Behaviors: Severe  Cognitive Functioning Concentration: Decreased Memory: Recent Intact IQ: Average Insight: Poor Impulse Control: Poor Appetite: Fair Weight Loss: 0  Weight Gain: 0  Sleep: Decreased Total Hours of Sleep: 5  Vegetative Symptoms: None  Prior Inpatient/Outpatient Therapy Prior Therapy: Inpatient Prior Therapy Dates: November 2012 Prior Therapy Facilty/Provider(s): [facility] Reason for Treatment: psychotic  ADL Screening (condition at time of admission) Patient able to express need for assistance with ADLs?: Yes                  Additional Information 1:1 In Past 12 Months?: No CIRT Risk: No Elopement Risk: No Does patient have medical clearance?: Yes     Disposition: Pt accepted to [facility] by Dr. Rogers Blocker and will go to room on 400 Hall bed once bed available.  ACT will notify ED when bed ready.    Disposition Disposition of Patient: Inpatient treatment program Type of inpatient treatment program: Adult  On Site Evaluation by:   Reviewed with Physician:      Titus Mould, Eppie Gibson 02/27/2011 10:55 AM

## 2011-02-27 NOTE — ED Notes (Signed)
Patient given a sandwich and drink sitter arrives.   Not as mouthy as earlier and less agitated

## 2011-02-27 NOTE — ED Notes (Signed)
Pt being transferred to [facility] via [facility] under involuntary commitment.

## 2011-02-28 DIAGNOSIS — F250 Schizoaffective disorder, bipolar type: Secondary | ICD-10-CM

## 2011-02-28 MED ORDER — HALOPERIDOL 5 MG PO TABS
20.0000 mg | ORAL_TABLET | Freq: Every day | ORAL | Status: DC
  Administered 2011-02-28 – 2011-03-08 (×9): 20 mg via ORAL
  Filled 2011-02-28 (×4): qty 4
  Filled 2011-02-28: qty 2
  Filled 2011-02-28 (×8): qty 4

## 2011-02-28 NOTE — Progress Notes (Signed)
[facility] Group Notes:  (Counselor/Nursing/MHT/Case Management/Adjunct)  02/28/2011 2:33 PM  Type of Therapy:  group therapy  Participation Level:  Active  Participation Quality:  Appropriate and Attentive  Affect:  Excited  Cognitive:  Alert  Insight:  Good  Engagement in Group:  Good  Engagement in Therapy:  Good  Modes of Intervention:  Problem-solving, Support and exploration  Summary of Progress/Problems: Pt stated he was feeling wonderful today, pt was answering questions via singing. Pt was able to share that his support comes from his love from God and having courage.  Mitchell [name] 02/28/2011, 2:33 PM

## 2011-02-28 NOTE — Progress Notes (Signed)
Pt is intrusive at times and has been irritable this morning complaining about all the medications he has to take   His thinking is disorganized    He filled out his self inventory with squiggly lines and he wrote a love letter on the back which was rambling and disorganized   He attends group and attempts to participate   Verbal support given  Medications administered and effectiveness monitored   Q 15 min checks   Pt safe at present

## 2011-02-28 NOTE — H&P (Signed)
Psychiatric Admission Assessment Adult  Patient Identification:  Mitchell [name] Date of Evaluation:  02/28/2011 Chief Complaint:  SCHIZOAFFECTIVE DISORDER History of Present Illness:: 50yo SAAM just discharged 02/17/11 to [facility]. Brought  To [facility] ED on IVC. The papers report delusional thinking psychosis and an inability to differentiate between reality and psychosis. Argued about meds this morning and hence suspect he has been non-compliant .  Refuses to return to [employer] to live in his own home.Claims to be Mitchell [name] and wants to be called Mitchell [name]. Mood Symptoms:  Actually this is the most normal I have seen him Depression Symptoms:  He denies  (Hypo) Manic Symptoms:   Elevated Mood:  No Irritable Mood:  Yes Grandiosity:  Yes Distractibility:  Yes Labiality of Mood:  Yes Delusions:  Yes Hallucinations:  Yes Impulsivity:  Yes Sexually Inappropriate Behavior:  No Financial Extravagance:  No Flight of Ideas:  No  Anxiety Symptoms: Excessive Worry:  Yes Panic Symptoms:  No Agoraphobia:  No Obsessive Compulsive: No  Symptoms: changes clothes constantly  Specific Phobias:  No Social Anxiety:  No  Psychotic Symptoms:  Hallucinations:  Auditory Delusions:  Yes thinks he is the president's [name]  Paranoia:  Yes  Thinks staff at [name] are falsely accusing him Ideas of Reference:  Yes  PTSD Symptoms: Ever had a traumatic exposure:  No Had a traumatic exposure in the last month:  No Re-experiencing:  None Hypervigilance:  No Hyperarousal:  None Avoidance:  None  Traumatic Brain Injury:  None reported   Past Psychiatric History: Diagnosis:Schizophrenia -paranoid   Hospitalizations:Many   Outpatient Care:needs ACT team   Substance Abuse Care:none   None   Suicidal Attempts:none   Violent Behaviors:none    Past Medical History:   Past Medical History  Diagnosis Date  . Hypertension   . Diabetes mellitus   . Mental disorder   .  Bipolar affective disorder   . Schizoaffective disorder    History of Loss of Consciousness:  No Seizure History:  No Cardiac History:  Yes Allergies:  No Known Allergies Current Medications:  Current Facility-Administered Medications  Medication Dose Route Frequency Provider Last Rate Last Dose  . acetaminophen (TYLENOL) tablet 650 mg  650 mg Oral Q6H PRN Mickie D. Nalda Shackleford, PA      . alum & mag hydroxide-simeth (MAALOX/MYLANTA) 200-200-20 MG/5ML suspension 30 mL  30 mL Oral Q4H PRN Mickie D. Dawan Farney, PA      . amLODipine (NORVASC) tablet 10 mg  10 mg Oral Daily Mickie D. Sharlene Mccluskey, PA   10 mg at 02/28/11 0816  . aspirin chewable tablet 81 mg  81 mg Oral Daily Mickie D. Diann Bangerter, PA   81 mg at 02/28/11 0816  . benztropine (COGENTIN) tablet 1 mg  1 mg Oral BID Mickie D. Chermaine Schnyder, PA   1 mg at 02/28/11 0816  . cloNIDine (CATAPRES) tablet 0.1 mg  0.1 mg Oral BID Mickie D. Persephone Schriever, PA   0.1 mg at 02/28/11 0816  . glipiZIDE (GLUCOTROL XL) 24 hr tablet 5 mg  5 mg Oral Q breakfast Mickie D. Nataline Basara, PA   5 mg at 02/28/11 0816  . haloperidol (HALDOL) tablet 10 mg  10 mg Oral QHS Mickie D. Satvik Parco, PA   10 mg at 02/27/11 2102  . ibuprofen (ADVIL,MOTRIN) tablet 400 mg  400 mg Oral TID Mickie D. Toba Claudio, PA   400 mg at 02/28/11 0816  . lisinopril (PRINIVIL,ZESTRIL) tablet 10 mg  10 mg Oral Daily Mickie D. Pernell Dupre,  PA   10 mg at 02/28/11 0816  . magnesium hydroxide (MILK OF MAGNESIA) suspension 30 mL  30 mL Oral Daily PRN Mickie D. Alora Gorey, PA      . metFORMIN (GLUCOPHAGE) tablet 1,000 mg  1,000 mg Oral BID WC Mickie D. Janissa Bertram, PA   1,000 mg at 02/28/11 1610   Facility-Administered Medications Ordered in Other Encounters  Medication Dose Route Frequency Provider Last Rate Last Dose  . DISCONTD: acetaminophen (TYLENOL) tablet 650 mg  650 mg Oral Q4H PRN Dayton Bailiff, MD      . DISCONTD: alum & mag hydroxide-simeth (MAALOX/MYLANTA) 200-200-20 MG/5ML suspension 30 mL  30 mL Oral PRN Dayton Bailiff, MD      . DISCONTD: ibuprofen  (ADVIL,MOTRIN) tablet 600 mg  600 mg Oral Q8H PRN Dayton Bailiff, MD      . DISCONTD: LORazepam (ATIVAN) tablet 1 mg  1 mg Oral Q8H PRN Dayton Bailiff, MD   1 mg at 02/27/11 1318  . DISCONTD: nicotine (NICODERM CQ - dosed in mg/24 hours) patch 21 mg  21 mg Transdermal Daily Dayton Bailiff, MD      . DISCONTD: ondansetron [facility]) tablet 4 mg  4 mg Oral Q8H PRN Dayton Bailiff, MD      . DISCONTD: zolpidem (AMBIEN) tablet 5 mg  5 mg Oral QHS PRN Dayton Bailiff, MD        Previous Psychotropic Medications:  Medication Dose                        Substance Abuse History in the last 12 months: Substance Age of 1st Use Last Use Amount Specific Type  Nicotine      Alcohol      Cannabis      Opiates      Cocaine      Methamphetamines      LSD      Ecstasy      Benzodiazepines      Caffeine      Inhalants      Others:                         Medical Consequences of Substance Abuse:NONE   Legal Consequences of Substance Abuse:NONE   Family Consequences of Substance Abuse:NONE  Blackouts:  No DT's:  No Withdrawal Symptoms:  None  Social History: Current Place of Residence:   Place of Birth:   Family Members: Marital Status:  Divorced Children:  Sons:  Daughters: Relationships: Education:   Educational Problems/Performance: Religious Beliefs/Practices: History of Abuse (Emotional/Phsycial/Sexual) Occupational Experiences; Military History:   Legal History: Hobbies/Interests:  Family History:  History reviewed. No pertinent family history.  Mental Status Examination/Evaluation: Objective:  Appearance: Well Groomed  Patent attorney::  Fair  Speech:  Clear and Coherent  Volume:  Normal  Mood:  Calmest and most normal that I have seen   Affect:  Congruent  Thought Process:  Goal Directed wants a house   Orientation:  Full  Thought Content:  Hallucinations: Auditory but he denies   Suicidal Thoughts:  No  Homicidal Thoughts:  No  Judgement:  Impaired  Insight:  Lacking    Psychomotor Activity:  Normal  Akathisia:  No  Handed:  Right  AIMS (if indicated):     Assets:  Social Support    Laboratory/X-Ray Psychological Evaluation(s)      Assessment:  Axis I: Schizoaffective Disorder  AXIS I Schizoaffective Disorder  AXIS II Deferred  AXIS III  Past Medical History  Diagnosis Date  . Hypertension   . Diabetes mellitus   . Mental disorder   . Bipolar affective disorder   . Schizoaffective disorder      AXIS IV economic problems, educational problems, housing problems, occupational problems, problems related to social environment and problems with primary support group  AXIS V 31-40 impairment in reality testing   Treatment Plan/Recommendations:  Treatment Plan Summary: Daily contact with patient to assess and evaluate symptoms and progress in treatment Medication management  Observation Level/Precautions:  C.O.  Laboratory:    Psychotherapy:  Group  Medications: continue discharge meds 11/21    Routine PRN Medications:  Yes  Consultations:    Discharge Concerns:  wants different placement   Other:      Jae Bruck,MICKIE D. 12/2/20129:28 AM

## 2011-02-28 NOTE — Progress Notes (Signed)
Pt. attended and participated in aftercare planning group. [facility] support group information was given. Pt. stated that he is at [facility] until he can find a place to stay. Pt. is delusional and stated that he bought a house and will move into his brand new home after D/C.

## 2011-02-28 NOTE — Progress Notes (Signed)
Admission Assessment     Demographic factors:    Current Mental Status:    Loss Factors:    Historical Factors:    Risk Reduction Factors:     CLINICAL FACTORS:   Schizophrenia:   Paranoid or undifferentiated type  COGNITIVE FEATURES THAT CONTRIBUTE TO RISK:  Thought constriction (tunnel vision)    SUICIDE RISK:   Minimal: No identifiable suicidal ideation.  Patients presenting with no risk factors but with morbid ruminations; may be classified as minimal risk based on the severity of the depressive symptoms  PLAN OF CARE:  I saw the patient and reviewed the medical records. 50 year old [place] American male with history of schizoaffective disorder who was recently discharged from behavioral health to [facility] care. Patient currently is disorganized and is grandiose. Patient is currently on Haldol 10 mg I will increase it to 20 mg because of his noncompliance with medications he will be a good candidate for monthly injection .   AHLUWALIA,SHAMSHER S 02/28/2011, 1:30 PM

## 2011-02-28 NOTE — Progress Notes (Signed)
Patient ID: Mitchell [name], male   DOB: 1961/01/24, 50 y.o.   MRN: [id number] The patient spent the evening pacing in the hallway. He was flirtatious with staff and male patients. His conversation and thought content is tangential. Compliant with medication.

## 2011-03-01 DIAGNOSIS — F259 Schizoaffective disorder, unspecified: Secondary | ICD-10-CM

## 2011-03-01 LAB — GLUCOSE, CAPILLARY: Glucose-Capillary: 94 mg/dL (ref 70–99)

## 2011-03-01 MED ORDER — DIVALPROEX SODIUM ER 500 MG PO TB24
500.0000 mg | ORAL_TABLET | Freq: Every day | ORAL | Status: DC
  Administered 2011-03-01: 500 mg via ORAL
  Filled 2011-03-01 (×3): qty 1

## 2011-03-01 NOTE — Discharge Planning (Signed)
Patient attended discharge planning group this AM but was not able to participate due to delusions. Patient states that his name is not [name] but [employer]. Patient came in and out of group on several occasions and did not attend to any of the other group members when they were speaking. Patient not able to answer questions about place of residence or discharge planning. Will reassess when patient is clearer. Per State Regulation 482.30  This chart was reviewed for medical necessity with respect to the patient's Admission/Duration of stay. Gayle Collard L. Scott Fix RN MS EdS  Date: 03/01/2011 Next review Date: 03/04/11

## 2011-03-01 NOTE — Progress Notes (Signed)
Patient ID: Mitchell [name], male   DOB: [birth year], 50 y.o.   MRN: [id number] The patient is labile with disorganized thoughts. He rambles on and appears to be responding to internal stimuli. He gets agitated and verbally threatening while talking to himself. Compliant with medication.

## 2011-03-01 NOTE — Progress Notes (Signed)
Mitchell [name]  03/01/2011 3:32 PM  Diagnosis:  Axis I: Schizoaffective Disorder - Bipolar Type.   The patient was seen today and reports the following:   Sleep: The patient reports to sleeping last night.  Appetite: Good.   Mild>(1-10) >Severe  Hopelessness (1-10): 0  Depression (1-10): 0  Anxiety (1-10): 0   Suicidal Ideation: The patient adamantly denies any suicidal ideations today.  Plan: No  Intent: No  Means: No   Homicidal Ideation: The patient adamantly denies any homicidal ideations today.  Plan: No  Intent: No.  Means: No  Eye Contact: Good.  General Appearance/Behavior: Neat and Casual  Motor Behavior: Hypomanic.  Speech: Moderately pressured with much spontaneous speech.  Mental Status: Alert and Oriented x 3  Level of Consciousness: Alert  Mood: Hypomanic.  Affect: Expansive.  Anxiety: None.  Thought Process: Disorganized.  Thought Content: Grandiose Delusions.  The patient stated that his Father is Mitchell [name] and his Mother is Mitchell [name].  Also he is the Father of the President. Perception: Poor  Judgment: Poor.  Insight: Poor.  Cognition: Orientation time, place and person.  Sleep:  Number of Hours: 5.25   Vital Signs:Blood pressure 123/81, pulse 79, temperature 98.1 F (36.7 C), temperature source Oral, resp. rate 20, height 5\' 2"  (1.575 m), weight 88.905 kg (196 lb).  Lab Results:  Results for orders placed during the hospital encounter of 02/27/11 (from the past 48 hour(s))  GLUCOSE, CAPILLARY     Status: Abnormal   Collection Time   02/27/11  5:19 PM      Component Value Range Comment   Glucose-Capillary 105 (*) 70 - 99 (mg/dL)    Comment 1 Notify RN     GLUCOSE, CAPILLARY     Status: Abnormal   Collection Time   02/27/11  8:29 PM      Component Value Range Comment   Glucose-Capillary 128 (*) 70 - 99 (mg/dL)   GLUCOSE, CAPILLARY     Status: Abnormal   Collection Time   02/28/11  6:22 AM      Component Value Range Comment   Glucose-Capillary 104 (*) 70 - 99 (mg/dL)   GLUCOSE, CAPILLARY     Status: Normal   Collection Time   02/28/11 11:43 AM      Component Value Range Comment   Glucose-Capillary 76  70 - 99 (mg/dL)   GLUCOSE, CAPILLARY     Status: Abnormal   Collection Time   02/28/11  5:28 PM      Component Value Range Comment   Glucose-Capillary 108 (*) 70 - 99 (mg/dL)    Comment 1 Notify RN     GLUCOSE, CAPILLARY     Status: Abnormal   Collection Time   02/28/11  9:08 PM      Component Value Range Comment   Glucose-Capillary 147 (*) 70 - 99 (mg/dL)   GLUCOSE, CAPILLARY     Status: Abnormal   Collection Time   03/01/11  6:21 AM      Component Value Range Comment   Glucose-Capillary 110 (*) 70 - 99 (mg/dL)   GLUCOSE, CAPILLARY     Status: Normal   Collection Time   03/01/11 11:44 AM      Component Value Range Comment   Glucose-Capillary 94  70 - 99 (mg/dL)    Comment 1 Notify RN      Treatment Plan Summary:  1. Daily contact with patient to assess and evaluate symptoms and progress in treatment  2.  Medication management  3. The patient will deny suicidal ideations or homicidal ideations for 48 hours prior to discharge and have a depression and anxiety rating of 3 or less. The patient will also deny any auditory or visual hallucinations or delusional thinking or display any manic or hypomanic behaviors.   Plan:  1. Will continue current medications. 2. Will start Depakote ER 500 mgs po qhs as a mood stabilizer. 3. Continue to monitor.  Mitchell [name] 03/01/2011, 3:32 PM

## 2011-03-01 NOTE — Progress Notes (Signed)
Pt has been up and has been active in milieu, has been participating in various milieu activities today, pt spoke about living in a big white house when he discharges and spoke about 21 lawyers and being related to Obama and the son of [name], pt also appears to be responding to internal stimuli, pt has received all medications today without incident, support provided, will continue to monitor.

## 2011-03-01 NOTE — Tx Team (Signed)
Interdisciplinary Treatment Plan Update (Adult)  Date:  03/01/2011  Time Reviewed:  10:51 AM   Progress in Treatment: Attending groups: Yes. Participating in groups:  Yes. Taking medication as prescribed:  Yes. Tolerating medication:  Yes. Family/Significant othe contact made:  No, will contact:  Patient can not give accurate informations for family contact. Contact was not made last admission for the same reason.  Patient understands diagnosis:  No. Discussing patient identified problems/goals with staff:  Yes. Medical problems stabilized or resolved:  Yes. Denies suicidal/homicidal ideation: Yes. Issues/concerns per patient self-inventory:  No. Other:  New problem(s) identified: Aftercare  Reason for Continuation of Hospitalization: Delusions  Medication stabilization Other; describe aftercare  Interventions implemented related to continuation of hospitalization:  Medication stabilization, group therapy, psychoeducation, 15-minute safety checks, monitoring of symptoms  Additional comments:  Not applicable  Estimated length of stay: 5-7 days  Discharge Plan:  New goal(s):  Review of initial/current patient goals per problem list:   1.  Goal(s):  Reduce psychosis to baseline  Met:  No  Target date:  By Discharge  As evidenced by:    2.  Goal (s):  Patient will cooperate with aftercare placement.  Met:    Target date:  By Discharge  As evidenced by:    3.  Goal(s):    Met:    Target date:  By Discharge  As evidenced by:    4.  Goal(s):    Met:    As evidenced by:    Attendees: Patient:  Mitchell [name]  03/01/2011 10:51 AM   Family:     Physician:  Dr. Harvie Heck Reading 03/01/2011 10:51 AM   Nursing:   Robbie Louis, RN 03/01/2011 10:51 AM   Case Manager:  Barrie Folk, RN 03/01/2011 10:51 AM   Counselor:   Veto Kemps, MT-BC 03/01/2011 10:51 AM   Other:  Tacy Learn, RN 03/01/2011 10:51 AM  Other:  Nanine Means, NP 03/01/2011 10;51 AM  Other:     Other:       Scribe for Treatment Team:   Veto Kemps, 03/01/2011 , 10:51 AM

## 2011-03-01 NOTE — Progress Notes (Signed)
In dayroom, pacing. Appears blunted. Is mildly cooperative. Conversation was minimal. When asked how is day was, stated,"I am going home tonight!". Informed pt that there were no plans for D/C tonight. Pt was upset with this information, but stated "Well, Im fine!" and walked away. Unit secretary came to Clinical research associate after interaction and reported that [employer] had called stating pt was making numerous phone calls through the 911 service asking for transportation. Writer discussed with pt who claimed he had a right to call 911 because someone told him he could go home and he is being held against his will. Explained to pt that his MD feels like he needs to stay and that is something only he can judge. Reminded pt he would have an opportunity to make a case for D/C when he sees his MD in AM. Also, reminded pt that it is illegal to make inappropriate calls to the 911 system, but pt continued to claim he would call again. Phone in dayroom was turned off. POC and medications for the shift reviewed. Safety has been maintained with Q15 minute observation. Will continue current POC.

## 2011-03-01 NOTE — Progress Notes (Signed)
[facility] Group Notes:  (Counselor/Nursing/MHT/Case Management/Adjunct)  03/01/2011 1:01 PM  Type of Therapy:  9:30AM Group Therapy  Participation Level:  Active  Participation Quality:  Appropriate and Attentive  Affect:  Appropriate  Cognitive:  Delusional  Insight:  Limited  Engagement in Group:  Good  Engagement in Therapy:  Good  Modes of Intervention:  Clarification, Education, Orientation, Support, and Exploration  Summary of Progress/Problems: Patient reports he has been admitted to [facility] because he is waiting for his house to be "ready". Patient reports he has a "white house in front of [employer]" that he plans to move into upon discharge. Patient also stated he is at [facility] to get stabilized on his medications.    Wilmon Arms 03/01/2011, 1:01 PM Veto Kemps, MT-BC 03/01/2011 1:12 PM

## 2011-03-02 LAB — GLUCOSE, CAPILLARY
Glucose-Capillary: 115 mg/dL — ABNORMAL HIGH (ref 70–99)
Glucose-Capillary: 159 mg/dL — ABNORMAL HIGH (ref 70–99)
Glucose-Capillary: 134 mg/dL — ABNORMAL HIGH (ref 70–99)

## 2011-03-02 MED ORDER — BENZTROPINE MESYLATE 1 MG PO TABS
1.0000 mg | ORAL_TABLET | ORAL | Status: DC
  Administered 2011-03-02 – 2011-03-03 (×2): 1 mg via ORAL
  Filled 2011-03-02 (×4): qty 1

## 2011-03-02 MED ORDER — BENZTROPINE MESYLATE 1 MG PO TABS
1.0000 mg | ORAL_TABLET | Freq: Two times a day (BID) | ORAL | Status: DC | PRN
  Filled 2011-03-02: qty 1

## 2011-03-02 MED ORDER — DIVALPROEX SODIUM ER 500 MG PO TB24
500.0000 mg | ORAL_TABLET | ORAL | Status: DC
  Administered 2011-03-02 – 2011-03-03 (×2): 500 mg via ORAL
  Filled 2011-03-02 (×6): qty 1

## 2011-03-02 MED ORDER — HALOPERIDOL 5 MG PO TABS
5.0000 mg | ORAL_TABLET | ORAL | Status: DC
  Administered 2011-03-02 – 2011-03-03 (×3): 5 mg via ORAL
  Filled 2011-03-02 (×4): qty 1

## 2011-03-02 MED ORDER — MAGNESIUM HYDROXIDE 400 MG/5ML PO SUSP: 30.0000 mL | Freq: Every day | ORAL | Status: DC | PRN

## 2011-03-02 NOTE — Progress Notes (Signed)
In bed with eyes closed on approach. Opened eyes spontaneously to name. States he is fine and was minimal with responses. Appears to be much less expansive vs. Last HS. Has not been engaged in any inappropriate behavior and took his medications without resistance. Denies pain or discomfort. Denies SI/HI/AVH. Reviewed POC and medications for the shift. Safety has been maintained with Q42minute observation. Will continue current POC.

## 2011-03-02 NOTE — Progress Notes (Signed)
[facility] Group Notes:  (Counselor/Nursing/MHT/Case Management/Adjunct)  03/02/2011 2:40 PM  Type of Therapy:  9:30AM Group Therapy  Participation Level:  Minimal  Participation Quality:  Attentive and Drowsy  Affect:  Appropriate  Cognitive:  Oriented  Insight:  Limited  Engagement in Group:  Limited  Engagement in Therapy:  Limited  Modes of Intervention:  Clarification, Education, Support and Exploration  Summary of Progress/Problems: Patient reported that he will return to [facility] upon discharge. Patient stated he will remain at [facility] until he can move into his house. Patient still presenting delusions about "this house". Ivory also explained to the group what was [facility] and the services they provide. Patient was sleeping during the beginning of the group discussion, but he became alert when is was his opportunity to speak.    Wilmon Arms 03/02/2011, 2:40 PM Veto Kemps, MT-BC 03/05/2011 1:25 PM

## 2011-03-02 NOTE — Discharge Planning (Signed)
Met with patient in Aftercare Planning Group.  He initially stated he would go to his home, and mentioned specific location, but would not give name of any family members/friends still.  Left the room for awhile, then came back and stated "I'll go back to [facility]."  When Case Manager called [facility], was told that decision has been made that he cannot return there.  He made claims while there of others hitting him, and was too delusional for them to feel they can truly help him.  He also refused contact with anyone in his life, just as he has done here.  The director stated that his recommendation would be to send him [facility] the state hospital for a longer hospitalization.  They are willing to keep his possessions for him until he can retrieve them; however, they also did not notice him having much, as he never went anywhere or got any visitors.  Ambrose Mantle, LCSW 03/02/2011, 2:27 PM

## 2011-03-02 NOTE — Progress Notes (Signed)
Pt was up in dayroom upon first assessment.  He was given his self-inventory to fill out which he just wrote all over it and never really answered any of the questions.  He took his medications without incident.  At first this morning, he was talking about not going back to [facility] but he planned to move into a white house close to [employer].  During discharge planning group he first said he was going to live in that house then he excused himself and when he came back in he stated,"yes, I am going back to [facility] now"  The CM told him that she would have to contact then to see what they were saying now.  He agreed but is still thinking he is leaving today.  Reminded him that the doctor would have to give the order for him to leave and there has not been an order written yet.  He did go on down to the cafeteria for lunch.

## 2011-03-02 NOTE — Progress Notes (Signed)
[facility] MD Progress Note  03/02/2011 2:01 PM  Diagnosis:  Axis I: Schizoaffective Disorder - Bipolar Type.   The patient was seen today and reports the following:   Sleep: The patient reports to sleeping last night.  Appetite: Good.   Mild>(1-10) >Severe  Hopelessness (1-10): 0  Depression (1-10): 0  Anxiety (1-10): 0   Suicidal Ideation: The patient adamantly denies any suicidal ideations today.  Plan: No  Intent: No  Means: No   Homicidal Ideation: The patient adamantly denies any homicidal ideations today.  Plan: No  Intent: No.  Means: No   Eye Contact: Good.  General Appearance/Behavior: Neat and Casual  Motor Behavior: Remains hypomanic.  Speech: Moderately pressured with much spontaneous speech.  Mental Status: Alert and Oriented x 3  Level of Consciousness: Alert  Mood: Hypomanic.  Affect: Expansive.  Anxiety: None.  Thought Process: Disorganized.  Thought Content: Grandiose Delusions continue. The patient stated that his Father is [name] and his Mother is [name]. Also he is the Father of the President.  Perception: Poor  Judgment: Poor.  Insight: Poor.  Cognition: Orientation time, place and person.  Sleep:  Number of Hours: 6.5   Vital Signs:Blood pressure 123/81, pulse 77, temperature 98 F (36.7 C), temperature source Oral, resp. rate 18, height 5\' 2"  (1.575 m), weight 88.905 kg (196 lb).  Lab Results:  Results for orders placed during the hospital encounter of 02/27/11 (from the past 48 hour(s))  GLUCOSE, CAPILLARY     Status: Abnormal   Collection Time   02/28/11  5:28 PM      Component Value Range Comment   Glucose-Capillary 108 (*) 70 - 99 (mg/dL)    Comment 1 Notify RN     GLUCOSE, CAPILLARY     Status: Abnormal   Collection Time   02/28/11  9:08 PM      Component Value Range Comment   Glucose-Capillary 147 (*) 70 - 99 (mg/dL)   GLUCOSE, CAPILLARY     Status: Abnormal   Collection Time   03/01/11  6:21 AM      Component Value Range  Comment   Glucose-Capillary 110 (*) 70 - 99 (mg/dL)   GLUCOSE, CAPILLARY     Status: Normal   Collection Time   03/01/11 11:44 AM      Component Value Range Comment   Glucose-Capillary 94  70 - 99 (mg/dL)    Comment 1 Notify RN     GLUCOSE, CAPILLARY     Status: Abnormal   Collection Time   03/01/11  4:56 PM      Component Value Range Comment   Glucose-Capillary 133 (*) 70 - 99 (mg/dL)    Comment 1 Notify RN     GLUCOSE, CAPILLARY     Status: Abnormal   Collection Time   03/01/11  8:51 PM      Component Value Range Comment   Glucose-Capillary 164 (*) 70 - 99 (mg/dL)   GLUCOSE, CAPILLARY     Status: Abnormal   Collection Time   03/02/11  6:33 AM      Component Value Range Comment   Glucose-Capillary 158 (*) 70 - 99 (mg/dL)    Comment 1 Notify RN     GLUCOSE, CAPILLARY     Status: Abnormal   Collection Time   03/02/11 11:49 AM      Component Value Range Comment   Glucose-Capillary 115 (*) 70 - 99 (mg/dL)    Treatment Plan Summary:  1. Daily contact with  patient to assess and evaluate symptoms and progress in treatment  2. Medication management  3. The patient will deny suicidal ideations or homicidal ideations for 48 hours prior to discharge and have a depression and anxiety rating of 3 or less. The patient will also deny any auditory or visual hallucinations or delusional thinking or display any manic or hypomanic behaviors.   Plan:  1. Will increase the medication Haldol to 5 mgs po q am and 2 pm and 10 mgs po qhs for his psychosis.  2. Will increase the medication Depakote ER to 500 mgs po q am and hs  as a mood stabilizer.  3. Continue to monitor.   Lexington Krotz 03/02/2011, 2:01 PM

## 2011-03-03 LAB — CBC
HCT: 37.8 % — ABNORMAL LOW (ref 39.0–52.0)
Hemoglobin: 13.6 g/dL (ref 13.0–17.0)
MCHC: 36 g/dL (ref 30.0–36.0)
RDW: 12.3 % (ref 11.5–15.5)
WBC: 5.3 10*3/uL (ref 4.0–10.5)

## 2011-03-03 LAB — COMPREHENSIVE METABOLIC PANEL
ALT: 19 U/L (ref 0–53)
Alkaline Phosphatase: 114 U/L (ref 39–117)
BUN: 14 mg/dL (ref 6–23)
CO2: 28 mEq/L (ref 19–32)
Calcium: 9.3 mg/dL (ref 8.4–10.5)
GFR calc Af Amer: >90 mL/min (ref >90)
GFR calc non Af Amer: >90 mL/min (ref >90)
Glucose, Bld: 139 mg/dL — ABNORMAL HIGH (ref 70–99)
Total Protein: 7.6 g/dL (ref 6.0–8.3)

## 2011-03-03 LAB — GLUCOSE, CAPILLARY
Glucose-Capillary: 92 mg/dL (ref 70–99)
Glucose-Capillary: 94 mg/dL (ref 70–99)

## 2011-03-03 LAB — DIFFERENTIAL
Basophils Absolute: 0 10*3/uL (ref 0.0–0.1)
Basophils Relative: 0 % (ref 0–1)
Lymphocytes Relative: 38 % (ref 12–46)
Monocytes Absolute: 0.4 10*3/uL (ref 0.1–1.0)
Neutro Abs: 2.8 10*3/uL (ref 1.7–7.7)
Neutrophils Relative %: 52 % (ref 43–77)

## 2011-03-03 LAB — VALPROIC ACID LEVEL: Valproic Acid Lvl: 41.9 ug/mL — ABNORMAL LOW (ref 50.0–100.0)

## 2011-03-03 MED ORDER — HALOPERIDOL 5 MG PO TABS
10.0000 mg | ORAL_TABLET | ORAL | Status: DC
  Administered 2011-03-04 – 2011-03-09 (×11): 10 mg via ORAL
  Filled 2011-03-03 (×13): qty 2

## 2011-03-03 MED ORDER — LISINOPRIL 20 MG PO TABS
20.0000 mg | ORAL_TABLET | Freq: Every day | ORAL | Status: DC
  Administered 2011-03-04 – 2011-03-05 (×2): 20 mg via ORAL
  Filled 2011-03-03 (×3): qty 1

## 2011-03-03 MED ORDER — BENZTROPINE MESYLATE 1 MG PO TABS
1.0000 mg | ORAL_TABLET | ORAL | Status: DC
  Administered 2011-03-03 – 2011-03-09 (×17): 1 mg via ORAL
  Filled 2011-03-03 (×19): qty 1

## 2011-03-03 MED ORDER — DIVALPROEX SODIUM ER 500 MG PO TB24
750.0000 mg | ORAL_TABLET | ORAL | Status: DC
  Administered 2011-03-03: 750 mg via ORAL
  Filled 2011-03-03 (×3): qty 1

## 2011-03-03 NOTE — Progress Notes (Signed)
[facility] MD Progress Note  03/03/2011 3:42 PM Diagnosis:  Axis I: Schizoaffective Disorder - Bipolar Type.   The patient was seen today and reports the following:   Sleep: The patient reports to sleeping last night but according to Nursing he did not sleep well.  Appetite: Good.   Mild>(1-10) >Severe  Hopelessness (1-10): 0  Depression (1-10): 0  Anxiety (1-10): 0   Suicidal Ideation: The patient adamantly denies any suicidal ideations today.  Plan: No  Intent: No  Means: No   Homicidal Ideation: The patient adamantly denies any homicidal ideations today.  Plan: No  Intent: No.  Means: No   Eye Contact: Good.  General Appearance/Behavior: Neat and Casual  Motor Behavior: Remains hypomanic.  Speech: Moderately pressured with much spontaneous speech.  Mental Status: Alert and Oriented x 3  Level of Consciousness: Alert  Mood: Hypomanic.  Affect: Angry.  Anxiety: None.  Thought Process: Disorganized.  Thought Content: Grandiose Delusions continue. The patient stated that his Father is [name] and his Mother is [name]. Also he is the Father of the President.  Perception: Poor  Judgment: Poor.  Insight: Poor.  Cognition: Orientation time, place and person.   The patient became very angry today when he was told he would not be discharged.  The patient insists he has lots of money in the bank and several places to live.  However, he has no place to go. He began to destroy furniture in his room but was able to calm down with some discussion.  Sleep:  Number of Hours: 3.75   Vital Signs:Blood pressure 142/93, pulse 87, temperature 97.5 F (36.4 C), temperature source Oral, resp. rate 18, height 5\' 2"  (1.575 m), weight 88.905 kg (196 lb).  Lab Results:  Results for orders placed during the hospital encounter of 02/27/11 (from the past 48 hour(s))  GLUCOSE, CAPILLARY     Status: Abnormal   Collection Time   03/01/11  4:56 PM      Component Value Range Comment   Glucose-Capillary 133 (*) 70 - 99 (mg/dL)    Comment 1 Notify RN     GLUCOSE, CAPILLARY     Status: Abnormal   Collection Time   03/01/11  8:51 PM      Component Value Range Comment   Glucose-Capillary 164 (*) 70 - 99 (mg/dL)   GLUCOSE, CAPILLARY     Status: Abnormal   Collection Time   03/02/11  6:33 AM      Component Value Range Comment   Glucose-Capillary 158 (*) 70 - 99 (mg/dL)    Comment 1 Notify RN     GLUCOSE, CAPILLARY     Status: Abnormal   Collection Time   03/02/11 11:49 AM      Component Value Range Comment   Glucose-Capillary 115 (*) 70 - 99 (mg/dL)   GLUCOSE, CAPILLARY     Status: Abnormal   Collection Time   03/02/11  4:59 PM      Component Value Range Comment   Glucose-Capillary 159 (*) 70 - 99 (mg/dL)   GLUCOSE, CAPILLARY     Status: Abnormal   Collection Time   03/02/11  8:58 PM      Component Value Range Comment   Glucose-Capillary 134 (*) 70 - 99 (mg/dL)    Comment 1 Notify RN     GLUCOSE, CAPILLARY     Status: Normal   Collection Time   03/03/11  6:27 AM      Component Value Range Comment  Glucose-Capillary 92  70 - 99 (mg/dL)    Comment 1 Notify RN     GLUCOSE, CAPILLARY     Status: Normal   Collection Time   03/03/11 11:46 AM      Component Value Range Comment   Glucose-Capillary 71  70 - 99 (mg/dL)    Comment 1 Notify RN      Treatment Plan Summary:  1. Daily contact with patient to assess and evaluate symptoms and progress in treatment  2. Medication management  3. The patient will deny suicidal ideations or homicidal ideations for 48 hours prior to discharge and have a depression and anxiety rating of 3 or less. The patient will also deny any auditory or visual hallucinations or delusional thinking or display any manic or hypomanic behaviors.   Plan:  1. Will increase the medication Haldol to 10 mgs po q am and 2 pm and 20 mgs po qhs for his psychosis.  2. Will increase the medication Depakote ER to 750 mgs po q am and hs as a mood stabilizer.  3.  Will increase Lisinopril to 20 mgs po q am for hypertension. 4. Labs including CMP, CBC with Diff, Free T3, Free T4 and Serum Depakote Level ordered tonight. 5. Continue to monitor.   Narcissa Melder 03/03/2011, 3:42 PM

## 2011-03-03 NOTE — Discharge Planning (Signed)
Met with patient in Aftercare Planning Group, where he became upset with Case Manager when asked if there is anybody at all in his family we can talk to, saying angrily that there is nobody alive and we already knew that.  States he has a room at [employer] with [employer].  Asks that CM call the [facility] [employer] to follow up and make sure he can still go there.  This is a new address for [place], and CM has been unable to verify anything today on this matter.  Patient stated he has lived there "between 37 and 20 years."  Patient to be placed under Involuntary Commitment again.  Ambrose Mantle, LCSW 03/03/2011, 3:44 PM

## 2011-03-03 NOTE — Progress Notes (Signed)
Pt was in bed asleep upon first assessment.  He was easily aroused by verbal stimuli.  He filled out his self-inventory the same as usual.  He just wrote all over but really didn't answer any questions.  He took all am medications without incident.  He went into discharge planning and became angry with CM for he was told he couldn't leave today.  He knows he can't go back to [facility] and claims he has an apartment that he can live there.  He gave CM an address and first name of the landlord.  Pt then got up walked out of group and slammed the door.  Dr. Allena Katz stopped pt and talked with him too but he became angry with him.  He told Dr. Allena Katz that he had plenty of money and it hurt his feelings that "y'all" think I have no money or a place to go.  Pt stormed off to his room where he tore the drawer out of the desk in the room and threw it on the floor.  He threw his trash can around and even pushed the door through to the other side.  He was able to calm down and talk with nurse.  He has agreed to stay for at least three more days until placement can be worked out for him.  For the rest of the day, he has been polite and appropriate. He has taken all medications and has not be angry with anyone thus far.  He even apologized for his behavior again during 1700 med pass.  Dr. Allena Katz has ordered some labs and made some med changes (see orders).  Dr. Allena Katz was informed of pt's past history of low sodium and pt has been drinking a lot of water per report.

## 2011-03-04 LAB — T4, FREE: Free T4: 1 ng/dL (ref 0.80–1.80)

## 2011-03-04 LAB — T3, FREE: T3, Free: 2.7 pg/mL (ref 2.3–4.2)

## 2011-03-04 MED ORDER — CARBAMAZEPINE 200 MG PO TABS
400.0000 mg | ORAL_TABLET | Freq: Every day | ORAL | Status: DC
  Administered 2011-03-04 – 2011-03-08 (×5): 400 mg via ORAL
  Filled 2011-03-04 (×9): qty 2

## 2011-03-04 MED ORDER — CARBAMAZEPINE 200 MG PO TABS
200.0000 mg | ORAL_TABLET | Freq: Every day | ORAL | Status: DC
  Administered 2011-03-04 – 2011-03-09 (×6): 200 mg via ORAL
  Filled 2011-03-04 (×8): qty 1

## 2011-03-04 MED ORDER — SODIUM CHLORIDE 1 G PO TABS
1.0000 g | ORAL_TABLET | Freq: Three times a day (TID) | ORAL | Status: DC
  Administered 2011-03-04 – 2011-03-09 (×17): 1 g via ORAL
  Filled 2011-03-04 (×27): qty 1

## 2011-03-04 MED ORDER — SODIUM CHLORIDE 1 G PO TABS
1.0000 g | ORAL_TABLET | Freq: Three times a day (TID) | ORAL | Status: DC
  Filled 2011-03-04 (×3): qty 1

## 2011-03-04 NOTE — Progress Notes (Addendum)
Patient ID: Mitchell [name], male   DOB: [birth year], 50 y.o.   MRN: [id number] Pt seen as second opinion.  This examiner cautioned that if he did not curse me out then I saw the wrong patient.  Staff on the unit advised me that he was in an unusually happy mood this PM.  Staff directed me to his room.  He does not have a roommate, so it is prety much assured that I did examine [name], but he indeed was in a happy mood.  He did not curse me out.  Pt spoke in a extremely rapid manner.  He explained that he was [employer] and that people were asking him to sign over his heritage and he was not going to do that.  He had to be asked to slow down his speech or repeat himself multiple times to be understood by this examiner.  He denied any problems except that he had diabetes and that was it.  He denied having any psychiatric problems, but it is rather obvious that he is delusional about being Management consultant.  He has a manic presentation with his hand gestures and the rapidity with which he talks and rambles from subject to subject. Diagnosis: Psychotic Disorder,  Manic behavior. Orson Aloe, MD, [facility] Certified in Psychiatry and Child/Adolescent Psychiatry

## 2011-03-04 NOTE — Progress Notes (Signed)
Pt was in bed upon first assessment.  He did asked when he could go home.  Turned the question back on him and asked if he remember what he was told yesterday.  He stated,"another couple of days".  He did take him medications without incident but his depakote was held until this nurse could speak with the doctor.  Pt's sodium is still low and it seems last time he was here his Depakote was stopped for that very reason.  Dr. Allena Katz did d/c the Depakote and started tegretol and some sodium tablets.  Pt became irate after those other meds were taken to him.  He stated,"what y'all trying to do kill me"  Explained to him that we were working on his problems and that is why he is taking the sodium now.  He finally voiced understanding but is still angry.

## 2011-03-04 NOTE — Progress Notes (Signed)
[facility] MD Progress Note  03/04/2011 3:32 PM  Diagnosis:  Axis I: Schizoaffective Disorder - Bipolar Type.   The patient was seen today and reports the following:   Sleep: The patient reports to sleeping well last night.  Appetite: Good.   Mild>(1-10) >Severe  Hopelessness (1-10): 0  Depression (1-10): 0  Anxiety (1-10): 0   Suicidal Ideation: The patient adamantly denies any suicidal ideations today.  Plan: No  Intent: No  Means: No   Homicidal Ideation: The patient adamantly denies any homicidal ideations today.  Plan: No  Intent: No.  Means: No   Eye Contact: Good.  General Appearance/Behavior: Neat and Casual  Motor Behavior: Remains hypomanic.  Speech: Moderately pressured.  Mental Status: Alert and Oriented x 3  Level of Consciousness: Alert  Mood: Hypomanic.  Affect: Again angry today about not being allowed to discharge.  Anxiety: None.  Thought Process: Disorganized.  Thought Content: Grandiose Delusions continue. The patient stated that his Father is [name] and his Mother is [name]. Also he is the Father of the President.  Perception: Poor  Judgment: Poor.  Insight: Poor.  Cognition: Orientation time, place and person.  Sleep:  Number of Hours: 6.25   Vital Signs:Blood pressure 121/74, pulse 91, temperature 97.9 F (36.6 C), temperature source Oral, resp. rate 16, height 5\' 2"  (1.575 m), weight 88.905 kg (196 lb).  Lab Results:  Results for orders placed during the hospital encounter of 02/27/11 (from the past 48 hour(s))  GLUCOSE, CAPILLARY     Status: Abnormal   Collection Time   03/02/11  4:59 PM      Component Value Range Comment   Glucose-Capillary 159 (*) 70 - 99 (mg/dL)   GLUCOSE, CAPILLARY     Status: Abnormal   Collection Time   03/02/11  8:58 PM      Component Value Range Comment   Glucose-Capillary 134 (*) 70 - 99 (mg/dL)    Comment 1 Notify RN     GLUCOSE, CAPILLARY     Status: Normal   Collection Time   03/03/11  6:27 AM   Component Value Range Comment   Glucose-Capillary 92  70 - 99 (mg/dL)    Comment 1 Notify RN     GLUCOSE, CAPILLARY     Status: Normal   Collection Time   03/03/11 11:46 AM      Component Value Range Comment   Glucose-Capillary 71  70 - 99 (mg/dL)    Comment 1 Notify RN     GLUCOSE, CAPILLARY     Status: Normal   Collection Time   03/03/11  4:59 PM      Component Value Range Comment   Glucose-Capillary 94  70 - 99 (mg/dL)   COMPREHENSIVE METABOLIC PANEL     Status: Abnormal   Collection Time   03/03/11  7:57 PM      Component Value Range Comment   Sodium 125 (*) 135 - 145 (mEq/L)    Potassium 4.5  3.5 - 5.1 (mEq/L)    Chloride 88 (*) 96 - 112 (mEq/L)    CO2 28  19 - 32 (mEq/L)    Glucose, Bld 139 (*) 70 - 99 (mg/dL)    BUN 14  6 - 23 (mg/dL)    Creatinine, Ser 1.61  0.50 - 1.35 (mg/dL)    Calcium 9.3  8.4 - 10.5 (mg/dL)    Total Protein 7.6  6.0 - 8.3 (g/dL)    Albumin 3.8  3.5 - 5.2 (g/dL)  AST 23  0 - 37 (U/L)    ALT 19  0 - 53 (U/L)    Alkaline Phosphatase 114  39 - 117 (U/L)    Total Bilirubin 0.1 (*) 0.3 - 1.2 (mg/dL)    GFR calc non Af Amer >90  >90 (mL/min)    GFR calc Af Amer >90  >90 (mL/min)   CBC     Status: Abnormal   Collection Time   03/03/11  7:57 PM      Component Value Range Comment   WBC 5.3  4.0 - 10.5 (K/uL)    RBC 4.32  4.22 - 5.81 (MIL/uL)    Hemoglobin 13.6  13.0 - 17.0 (g/dL)    HCT 16.1 (*) 09.6 - 52.0 (%)    MCV 87.5  78.0 - 100.0 (fL)    MCH 31.5  26.0 - 34.0 (pg)    MCHC 36.0  30.0 - 36.0 (g/dL)    RDW 04.5  40.9 - 81.1 (%)    Platelets 225  150 - 400 (K/uL)   DIFFERENTIAL     Status: Normal   Collection Time   03/03/11  7:57 PM      Component Value Range Comment   Neutrophils Relative 52  43 - 77 (%)    Neutro Abs 2.8  1.7 - 7.7 (K/uL)    Lymphocytes Relative 38  12 - 46 (%)    Lymphs Abs 2.0  0.7 - 4.0 (K/uL)    Monocytes Relative 8  3 - 12 (%)    Monocytes Absolute 0.4  0.1 - 1.0 (K/uL)    Eosinophils Relative 3  0 - 5 (%)     Eosinophils Absolute 0.1  0.0 - 0.7 (K/uL)    Basophils Relative 0  0 - 1 (%)    Basophils Absolute 0.0  0.0 - 0.1 (K/uL)   VALPROIC ACID LEVEL     Status: Abnormal   Collection Time   03/03/11  7:57 PM      Component Value Range Comment   Valproic Acid Lvl 41.9 (*) 50.0 - 100.0 (ug/mL)   T3, FREE     Status: Normal   Collection Time   03/03/11  7:57 PM      Component Value Range Comment   T3, Free 2.7  2.3 - 4.2 (pg/mL)   T4, FREE     Status: Normal   Collection Time   03/03/11  7:57 PM      Component Value Range Comment   Free T4 1.00  0.80 - 1.80 (ng/dL)   GLUCOSE, CAPILLARY     Status: Abnormal   Collection Time   03/03/11  9:02 PM      Component Value Range Comment   Glucose-Capillary 151 (*) 70 - 99 (mg/dL)   GLUCOSE, CAPILLARY     Status: Normal   Collection Time   03/04/11  5:52 AM      Component Value Range Comment   Glucose-Capillary 90  70 - 99 (mg/dL)    Comment 1 Notify RN     GLUCOSE, CAPILLARY     Status: Normal   Collection Time   03/04/11 11:56 AM      Component Value Range Comment   Glucose-Capillary 73  70 - 99 (mg/dL)    Treatment Plan Summary:  1. Daily contact with patient to assess and evaluate symptoms and progress in treatment  2. Medication management  3. The patient will deny suicidal ideations or homicidal ideations for 48 hours prior to discharge  and have a depression and anxiety rating of 3 or less. The patient will also deny any auditory or visual hallucinations or delusional thinking or display any manic or hypomanic behaviors.   Plan:  1. Will continue current medications. 2. Will discontinue Depakote secondary to concerns of hyponatremia. 3. Will start Tegretol 200 mgs po qam and 400 mgs po qhs for mood stabilization. 4. Will start Sodium Chloride Tablets 1 gram TID to help replenish his sodium. 5. Labs including CMP, CBC with Diff and Serum Tegretol Level ordered for tomorrow night.  6. Referral to [facility] in progress. 7. Continue to  monitor.   Nickisha Hum 03/04/2011, 3:32 PM

## 2011-03-04 NOTE — Tx Team (Signed)
Interdisciplinary Treatment Plan Update (Adult)  Date:  03/04/2011  Time Reviewed:  9:51 AM   Progress in Treatment: Attending groups:  Yes, but usually leaving after just a few minutes Participating in groups:    When called on, if still in the room Taking medication as prescribed:    Yes Tolerating medication:   Yes Family/Significant other contact made:  No, refuses and/or does not have anyone Patient understands diagnosis:   No Discussing patient identified problems/goals with staff:   Only discusses that he will go live in random houses at d/c Medical problems stabilized or resolved:   No, still looking at sodium issues, ensuring these do not become uncontrollable Denies suicidal/homicidal ideation:  Yes Issues/concerns per patient self-inventory:   None Other:  New problem(s) identified: Yes, Describe:  options are limited with placement, patient's inability or unwillingness to allow [place] to talk to people who could perhaps be helpful to him, lacks capacity to make good decisions right now  Reason for Continuation of Hospitalization: Aggression Anxiety Delusions  Depression Hallucinations Medical Issues Medication stabilization  Interventions implemented related to continuation of hospitalization:  Medication monitoring and adjustment, safety checks Q15 min., group therapy, psychoeducation, collateral contact  Additional comments:  Not applicable  Estimated length of stay:  2-4 days  Discharge Plan:  Decision has been made to make referral to [facility] for patient  New goal(s):  Not applicable  Review of initial/current patient goals per problem list:   1.  Goal(s):  Reduce psychosis to baseline  Met:  No  Target date:  By Discharge   As evidenced by:  Remains psychotic  2.  Goal(s):  Patient will cooperate with aftercare placement  Met:  No  Target date:  By Discharge   As evidenced by:  Uncooperative with reality-based discussions re  discharge options     Attendees: Patient:  Did not attend   Family:     Physician:  Dr. Harvie Heck Readling 03/04/2011  9:51 AM   Nursing:   Robbie Louis, RN 03/04/2011  9:51 AM   Case Manager:  Ambrose Mantle, LCSW 03/04/2011  9:51 AM   Counselor:     Other:  Izola Price, RN 03/04/2011  9:51 AM   Other:  Nanine Means, RN, MSN   Other:     Other:      Scribe for Treatment Team:   [name], 03/04/2011, 9:51 AM

## 2011-03-04 NOTE — Progress Notes (Signed)
Spoke with pt in the hall outside the dayroom.  He only gave one word answers to questions as if he were trying to avoid being in conversation.  He was irritable, but not hostile.  He did allow the lab tech to draw his blood earlier.  He took his meds without incident.  Encouraged pt ot make his needs known to staff.  Safety maintained with q15 minute checks.

## 2011-03-04 NOTE — Discharge Planning (Addendum)
Initiated process of referral to [facility] through [facility].    Ambrose Mantle, LCSW 03/04/2011, 2:44 PM  Per State Regulation 482.30  This chart was reviewed for medical necessity with respect to the patient's Admission/Duration of stay.   Next review due:  03/07/11   Ambrose Mantle, LCSW  03/04/2011  2:45 PM

## 2011-03-05 LAB — DIFFERENTIAL
Basophils Absolute: 0 10*3/uL (ref 0.0–0.1)
Basophils Relative: 0 % (ref 0–1)
Monocytes Absolute: 0.5 10*3/uL (ref 0.1–1.0)
Neutro Abs: 3.2 10*3/uL (ref 1.7–7.7)

## 2011-03-05 LAB — COMPREHENSIVE METABOLIC PANEL
ALT: 25 U/L (ref 0–53)
Alkaline Phosphatase: 104 U/L (ref 39–117)
CO2: 30 mEq/L (ref 19–32)
Chloride: 87 mEq/L — ABNORMAL LOW (ref 96–112)
GFR calc Af Amer: 87 mL/min — ABNORMAL LOW (ref >90)
GFR calc non Af Amer: 75 mL/min — ABNORMAL LOW (ref >90)
Glucose, Bld: 158 mg/dL — ABNORMAL HIGH (ref 70–99)
Potassium: 4.7 mEq/L (ref 3.5–5.1)
Sodium: 124 mEq/L — ABNORMAL LOW (ref 135–145)
Total Protein: 7.1 g/dL (ref 6.0–8.3)

## 2011-03-05 LAB — GLUCOSE, CAPILLARY
Glucose-Capillary: 91 mg/dL (ref 70–99)
Glucose-Capillary: 98 mg/dL (ref 70–99)

## 2011-03-05 LAB — CBC
HCT: 35.7 % — ABNORMAL LOW (ref 39.0–52.0)
MCHC: 35.6 g/dL (ref 30.0–36.0)
RDW: 12.3 % (ref 11.5–15.5)

## 2011-03-05 MED ORDER — LISINOPRIL 20 MG PO TABS
30.0000 mg | ORAL_TABLET | Freq: Every day | ORAL | Status: DC
  Administered 2011-03-06 – 2011-03-09 (×4): 30 mg via ORAL
  Filled 2011-03-05 (×6): qty 1

## 2011-03-05 NOTE — Progress Notes (Signed)
[facility] - Inpatient MD Progress Note  03/05/2011 4:22 PM  Diagnosis:  Axis I: Schizoaffective Disorder - Bipolar Type.   The patient was seen today and reports the following:   Sleep: The patient again reports to sleeping well last night.  Appetite: Good.   Mild>(1-10) >Severe  Hopelessness (1-10): 0  Depression (1-10): 0  Anxiety (1-10): 0   Suicidal Ideation: The patient adamantly denies any suicidal ideations today.  Plan: No  Intent: No  Means: No   Homicidal Ideation: The patient adamantly denies any homicidal ideations today.  Plan: No  Intent: No.  Means: No   Eye Contact: Good.  General Appearance/Behavior: Neat and Casual  Motor Behavior: Remains hypomanic.  Speech: Mildly pressured.  Mental Status: Alert and Oriented x 3  Level of Consciousness: Alert  Mood: Hypomanic.  Affect: Remains Angry today stating that the treatment team is "lying" to him and not allowing  Anxiety: None.  Thought Process: Disorganized.  Thought Content: Grandiose Delusions continue. The patient stated that his Father is [name] and his Mother is [name]. Also he is the Father of the President. He also states that the staff is keeping him against his will and is lying to him. Perception: Poor  Judgment: Poor.  Insight: Poor.  Cognition: Orientation time, place and person.  Sleep:  Number of Hours: 5.75   Vital Signs:Blood pressure 165/89, pulse 92, temperature 98 F (36.7 C), temperature source Oral, resp. rate 20, height 5\' 2"  (1.575 m), weight 88.905 kg (196 lb).  Lab Results:  Results for orders placed during the hospital encounter of 02/27/11 (from the past 48 hour(s))  GLUCOSE, CAPILLARY     Status: Normal   Collection Time   03/03/11  4:59 PM      Component Value Range Comment   Glucose-Capillary 94  70 - 99 (mg/dL)   COMPREHENSIVE METABOLIC PANEL     Status: Abnormal   Collection Time   03/03/11  7:57 PM      Component Value Range Comment   Sodium 125 (*) 135 - 145 (mEq/L)    Potassium 4.5  3.5 - 5.1 (mEq/L)    Chloride 88 (*) 96 - 112 (mEq/L)    CO2 28  19 - 32 (mEq/L)    Glucose, Bld 139 (*) 70 - 99 (mg/dL)    BUN 14  6 - 23 (mg/dL)    Creatinine, Ser 8.29  0.50 - 1.35 (mg/dL)    Calcium 9.3  8.4 - 10.5 (mg/dL)    Total Protein 7.6  6.0 - 8.3 (g/dL)    Albumin 3.8  3.5 - 5.2 (g/dL)    AST 23  0 - 37 (U/L)    ALT 19  0 - 53 (U/L)    Alkaline Phosphatase 114  39 - 117 (U/L)    Total Bilirubin 0.1 (*) 0.3 - 1.2 (mg/dL)    GFR calc non Af Amer >90  >90 (mL/min)    GFR calc Af Amer >90  >90 (mL/min)   CBC     Status: Abnormal   Collection Time   03/03/11  7:57 PM      Component Value Range Comment   WBC 5.3  4.0 - 10.5 (K/uL)    RBC 4.32  4.22 - 5.81 (MIL/uL)    Hemoglobin 13.6  13.0 - 17.0 (g/dL)    HCT 56.2 (*) 13.0 - 52.0 (%)    MCV 87.5  78.0 - 100.0 (fL)    MCH 31.5  26.0 - 34.0 (pg)  MCHC 36.0  30.0 - 36.0 (g/dL)    RDW 16.1  09.6 - 04.5 (%)    Platelets 225  150 - 400 (K/uL)   DIFFERENTIAL     Status: Normal   Collection Time   03/03/11  7:57 PM      Component Value Range Comment   Neutrophils Relative 52  43 - 77 (%)    Neutro Abs 2.8  1.7 - 7.7 (K/uL)    Lymphocytes Relative 38  12 - 46 (%)    Lymphs Abs 2.0  0.7 - 4.0 (K/uL)    Monocytes Relative 8  3 - 12 (%)    Monocytes Absolute 0.4  0.1 - 1.0 (K/uL)    Eosinophils Relative 3  0 - 5 (%)    Eosinophils Absolute 0.1  0.0 - 0.7 (K/uL)    Basophils Relative 0  0 - 1 (%)    Basophils Absolute 0.0  0.0 - 0.1 (K/uL)   VALPROIC ACID LEVEL     Status: Abnormal   Collection Time   03/03/11  7:57 PM      Component Value Range Comment   Valproic Acid Lvl 41.9 (*) 50.0 - 100.0 (ug/mL)   T3, FREE     Status: Normal   Collection Time   03/03/11  7:57 PM      Component Value Range Comment   T3, Free 2.7  2.3 - 4.2 (pg/mL)   T4, FREE     Status: Normal   Collection Time   03/03/11  7:57 PM      Component Value Range Comment   Free T4 1.00  0.80 - 1.80 (ng/dL)   GLUCOSE, CAPILLARY     Status:  Abnormal   Collection Time   03/03/11  9:02 PM      Component Value Range Comment   Glucose-Capillary 151 (*) 70 - 99 (mg/dL)   GLUCOSE, CAPILLARY     Status: Normal   Collection Time   03/04/11  5:52 AM      Component Value Range Comment   Glucose-Capillary 90  70 - 99 (mg/dL)    Comment 1 Notify RN     GLUCOSE, CAPILLARY     Status: Normal   Collection Time   03/04/11 11:56 AM      Component Value Range Comment   Glucose-Capillary 73  70 - 99 (mg/dL)   GLUCOSE, CAPILLARY     Status: Abnormal   Collection Time   03/04/11  4:41 PM      Component Value Range Comment   Glucose-Capillary 176 (*) 70 - 99 (mg/dL)   GLUCOSE, CAPILLARY     Status: Abnormal   Collection Time   03/04/11  9:43 PM      Component Value Range Comment   Glucose-Capillary 187 (*) 70 - 99 (mg/dL)    Comment 1 Notify RN      Comment 2 Documented in Chart     GLUCOSE, CAPILLARY     Status: Normal   Collection Time   03/05/11  6:02 AM      Component Value Range Comment   Glucose-Capillary 91  70 - 99 (mg/dL)    Comment 1 Notify RN      Comment 2 Documented in Chart     GLUCOSE, CAPILLARY     Status: Normal   Collection Time   03/05/11 11:43 AM      Component Value Range Comment   Glucose-Capillary 98  70 - 99 (mg/dL)    Treatment Plan  Summary:  1. Daily contact with patient to assess and evaluate symptoms and progress in treatment  2. Medication management  3. The patient will deny suicidal ideations or homicidal ideations for 48 hours prior to discharge and have a depression and anxiety rating of 3 or less. The patient will also deny any auditory or visual hallucinations or delusional thinking or display any manic or hypomanic behaviors.   Plan:  1. Will continue current medications.  2. Labs ordered for tonight. 3. Referral to [facility] in progress.  4. Will increase Lisinopril to 30 mgs po q am for blood pressure. 5. Continue to monitor.  Randy Readling 03/05/2011, 4:22 PM

## 2011-03-05 NOTE — Discharge Planning (Signed)
Patient has been placed on state hospital wait list.  Ambrose Mantle, LCSW 03/05/2011, 2:34 PM

## 2011-03-05 NOTE — Progress Notes (Signed)
Pt was calm and cooperative this evening. However pt was observed to be in a mood of sadness. He wasn't as talkative as usual. When asked how he was, pt stated that he was doing just lovely. Pt requested that his meds be given at the bedside. Pt was assessed for SI and denied any thoughts of SI. Pt safety remains with q58min checks.

## 2011-03-05 NOTE — Progress Notes (Signed)
Patient ID: Mitchell [name], male   DOB: 1960/09/03, 50 y.o.   MRN: [id number] 03-05-11 pt has been very labile today. When ask the reason it is because he want to go home. On his inventory sheet he wrote his appetite is good, energy normal, attention good, withdrawal symptoms tremor. This pt is not here for substance issues. He stated yes to suicide ideation, but was able to contract earlier in the shift. Physical problems were lightheaded and dizzy, but he has been in bed most of the am. He said he would have problems staying on medications after discharge. His inventory sheet was illegible/ incomplete. RN will continue to monitor and safety checks continue every 15 minutes.

## 2011-03-05 NOTE — Discharge Planning (Signed)
Met with patient in Aftercare Planning Group where he continued to request discharge "home".  Case Manager asked him to sign a release for the [employer] to try to find out information about whether he actually has a home to return to, since we have been unable to find out anything about collateral contacts.  He happily signed the release, but when Case Manager called, was told that they are closed until Monday due to a holiday function today.   Authorization # [id number] was received from [facility] for patient to go to [facility].  Still awaiting word from the state hospital, however.  Ambrose Mantle, LCSW 03/05/2011, 1:37 PM

## 2011-03-06 LAB — GLUCOSE, CAPILLARY
Glucose-Capillary: 127 mg/dL — ABNORMAL HIGH (ref 70–99)
Glucose-Capillary: 131 mg/dL — ABNORMAL HIGH (ref 70–99)
Glucose-Capillary: 202 mg/dL — ABNORMAL HIGH (ref 70–99)

## 2011-03-06 MED ORDER — GUAIFENESIN 100 MG/5ML PO SOLN
10.0000 mL | Freq: Four times a day (QID) | ORAL | Status: DC | PRN
  Administered 2011-03-06: 200 mg via ORAL
  Filled 2011-03-06: qty 10

## 2011-03-06 NOTE — Progress Notes (Signed)
Patient ID: Mitchell [name], male   DOB: [birth year], 50 y.o.   MRN: [id number] 03-06-11 nursing shift note: pt came to the medication window this am and was angry stating he "already had taken his medications". He had not taken his medications and continues to say this all most medication times x3 days. He was deescalated easily by RN and redirectable. He refused to fill out his inventory sheet and answer any question asked by the RN. RN will continue to monitor and safety checks continue every 15 minutes.

## 2011-03-06 NOTE — Progress Notes (Signed)
Patient ID: Mitchell [name], male   DOB: 11/26/1960, 50 y.o.   MRN: [id number] Pt. Eyes closed resp. Even no distress noted.

## 2011-03-06 NOTE — Progress Notes (Signed)
Pt. Is very pleasant  And is stating he needs food that he did not have supper. Spoke with tech who stated pt. Did go down for dinner but pt. Was given some more to eat as he was very hungry. Pt. Has poor eye contact and walks around with a frown on his face. Pt. Presently is sitting in the dayroom with the other pts. Denies feeling SI or HI and contracts for safety. Denies any hallucinations.

## 2011-03-06 NOTE — Progress Notes (Signed)
Patient ID: Mitchell [name], male   DOB: 12/25/1960, 50 y.o.   MRN: [id number]   S:  Patient was seen during rounds today. He states, I'm doing great. My mood is good. I'm sleeping well. But, I'm having some cough issues since yesterday, but I haven't told anybody about it yet.  O: Patient is observed lying in bed with eye closed, easily aroused. Alert and oriented x 3. Neatly dressed, well groomed. Good eye contact. Affect is good. Speech is clear and coherent. Behavior appropriate to situation during this assessment. He denies SIHI, AVH, delusions and or paranoia. Temp. At 99.4. Denies any shortness of breath and or chest pain. No production noted.  A: Schizoaffective disorder, bipolar type.  P: Will continue patient on current treatment plan.      Will initiate guaifenesin cough syrup 10 ml every 6 hours PRN.      Will continue Q 15 checks for safety.

## 2011-03-06 NOTE — Progress Notes (Signed)
Patient ID: Mitchell [name], male   DOB: February 12, 1961, 50 y.o.   MRN: [id number]   [facility] Group Notes:  (Counselor/Nursing/MHT/Case Management/Adjunct)  03/06/2011 11 AM  Type of Therapy:  Group Therapy, Dance/Movement Therapy   Participation Level:  Did Not Attend  Kym Groom

## 2011-03-07 LAB — GLUCOSE, CAPILLARY: Glucose-Capillary: 137 mg/dL — ABNORMAL HIGH (ref 70–99)

## 2011-03-07 NOTE — Progress Notes (Signed)
Patient ID: Mitchell [name], male   DOB: [birth year], 50 y.o.   MRN: [id number] Pt. Makes minimal contact except to ask for something for a headache, does not interact in dayroom, but does make phone call. Pt. Complains of headache at a "7". Writer administered Tylenol 650mg  po. Writer will assess for relief in an hour. Staff will continue to monitor q15 min for safety.

## 2011-03-07 NOTE — Progress Notes (Signed)
Patient ID: Mitchell [name], male   DOB: 1960/11/03, 50 y.o.   MRN: [id number]  [facility] Group Notes: (Counselor/Nursing/MHT/Case Management/Adjunct)  03/07/2011 11 AM  Type of Therapy: Group Therapy, Dance/Movement Therapy   Participation Level: Minimal   Participation Quality: Drowsy   Affect: Depressed   Cognitive: Oriented   Insight: None   Engagement in Group: None   Engagement in Therapy: None   Modes of Intervention: Clarification, Problem-solving, Role-play, Socialization and Support   Summary of Progress/Problems:  Pt came to group but had trouble staying awake and left after a few minutes to go lay down.

## 2011-03-07 NOTE — Progress Notes (Signed)
Patient ID: Mitchell [name], male   DOB: [birth year], 50 y.o.   MRN: [id number] Pt. Lying supine, resp. Even,snoring, no distress. Staff will continue to monitor q78min for safety.

## 2011-03-07 NOTE — Progress Notes (Addendum)
Patient ID: Mitchell [name], male   DOB: 1960/10/12, 50 y.o.   MRN: [id number] 03/07/2011 Nrsg 1330 D Seiji remains OOB UAL on the 400 hall today...tolerated fair. HE has taken his meds according to MD order. HE is cooperative, is able to ask the staff for things that he needs and handles not getting what he wants ( so far ) okay. A He  stayed back for  breakfast this morning , but went to the cafe for his lunch. He has not completed his self inventory at the time of this writing, but he is encouraged to do so by staff. Staff reported to this nurse that pt had fever earlier this afternoon and was overheard throwing up, but he did not tell this nurse and / or make mention of it to [employer] staff . This nurse will f/ u with him when he awakens from his nap. RN spoke with pt...who states he has had the flu all week and that his stomach and headache are " getting better".  R Safety is maintaiend and POC includes fostering therapeutic relationship PD RN [facility]

## 2011-03-07 NOTE — Progress Notes (Signed)
[facility] MD Progress Note  03/07/2011 12:10 PM  Diagnosis:   Axis I: Schizoaffective Disorder, bipolar type Axis II: Deferred Axis III:  Past Medical History  Diagnosis Date  . Hypertension   . Diabetes mellitus   . Mental disorder   . Bipolar affective disorder   . Schizoaffective disorder    Axis IV: No changes Axis V: 21-30 behavior considerably influenced by delusions or hallucinations OR serious impairment in judgment, communication OR inability to function in almost all areas  ADL's:  Intact  Sleep:  6.75  Appetite:  Good  Suicidal Ideation:   Plan:  No  Intent:  No  Means:  No  Homicidal Ideation:   Plan:  No  Intent:  No  Means:  No  AEB (as evidenced by):  Mental Status: General Appearance Mitchell [name]:  Neat and Casual Eye Contact:  Good Motor Behavior:  Normal Speech:  Normal Level of Consciousness:  Alert Mood:  Euthymic Affect:  Appropriate Anxiety Level:  Minimal Thought Process:  Coherent Thought Content:  WNL Perception:  Denies AVH Judgment:  Poor Insight:  Absent Cognition:  Orientation time, place and person Sleep:  Number of Hours: 6.75   Vital Signs:Blood pressure 145/94, pulse 114, temperature 99.2 F (37.3 C), temperature source Oral, resp. rate 20, height 5\' 2"  (1.575 m), weight 196 lb (88.905 kg).  Lab Results:  Results for orders placed during the hospital encounter of 02/27/11 (from the past 48 hour(s))  GLUCOSE, CAPILLARY     Status: Abnormal   Collection Time   03/05/11  4:53 PM      Component Value Range Comment   Glucose-Capillary 152 (*) 70 - 99 (mg/dL)   COMPREHENSIVE METABOLIC PANEL     Status: Abnormal   Collection Time   03/05/11  7:55 PM      Component Value Range Comment   Sodium 124 (*) 135 - 145 (mEq/L)    Potassium 4.7  3.5 - 5.1 (mEq/L)    Chloride 87 (*) 96 - 112 (mEq/L)    CO2 30  19 - 32 (mEq/L)    Glucose, Bld 158 (*) 70 - 99 (mg/dL)    BUN 14  6 - 23 (mg/dL)    Creatinine, Ser 3.08  0.50 - 1.35 (mg/dL)     Calcium 9.1  8.4 - 10.5 (mg/dL)    Total Protein 7.1  6.0 - 8.3 (g/dL)    Albumin 3.6  3.5 - 5.2 (g/dL)    AST 23  0 - 37 (U/L)    ALT 25  0 - 53 (U/L)    Alkaline Phosphatase 104  39 - 117 (U/L)    Total Bilirubin 0.2 (*) 0.3 - 1.2 (mg/dL)    GFR calc non Af Amer 75 (*) >90 (mL/min)    GFR calc Af Amer 87 (*) >90 (mL/min)   CBC     Status: Abnormal   Collection Time   03/05/11  7:55 PM      Component Value Range Comment   WBC 5.1  4.0 - 10.5 (K/uL)    RBC 4.05 (*) 4.22 - 5.81 (MIL/uL)    Hemoglobin 12.7 (*) 13.0 - 17.0 (g/dL)    HCT 65.7 (*) 84.6 - 52.0 (%)    MCV 88.1  78.0 - 100.0 (fL)    MCH 31.4  26.0 - 34.0 (pg)    MCHC 35.6  30.0 - 36.0 (g/dL)    RDW 96.2  95.2 - 84.1 (%)    Platelets 167  150 - 400 (  K/uL)   DIFFERENTIAL     Status: Normal   Collection Time   03/05/11  7:55 PM      Component Value Range Comment   Neutrophils Relative 63  43 - 77 (%)    Neutro Abs 3.2  1.7 - 7.7 (K/uL)    Lymphocytes Relative 23  12 - 46 (%)    Lymphs Abs 1.2  0.7 - 4.0 (K/uL)    Monocytes Relative 10  3 - 12 (%)    Monocytes Absolute 0.5  0.1 - 1.0 (K/uL)    Eosinophils Relative 4  0 - 5 (%)    Eosinophils Absolute 0.2  0.0 - 0.7 (K/uL)    Basophils Relative 0  0 - 1 (%)    Basophils Absolute 0.0  0.0 - 0.1 (K/uL)   CARBAMAZEPINE LEVEL, TOTAL     Status: Normal   Collection Time   03/05/11  7:55 PM      Component Value Range Comment   Carbamazepine Lvl 5.1  4.0 - 12.0 (ug/mL)   GLUCOSE, CAPILLARY     Status: Abnormal   Collection Time   03/05/11  8:45 PM      Component Value Range Comment   Glucose-Capillary 177 (*) 70 - 99 (mg/dL)   GLUCOSE, CAPILLARY     Status: Abnormal   Collection Time   03/06/11  6:15 AM      Component Value Range Comment   Glucose-Capillary 127 (*) 70 - 99 (mg/dL)    Comment 1 Notify RN     GLUCOSE, CAPILLARY     Status: Normal   Collection Time   03/06/11 11:50 AM      Component Value Range Comment   Glucose-Capillary 70  70 - 99 (mg/dL)    Comment  1 Notify RN     GLUCOSE, CAPILLARY     Status: Abnormal   Collection Time   03/06/11  5:15 PM      Component Value Range Comment   Glucose-Capillary 131 (*) 70 - 99 (mg/dL)    Comment 1 Notify RN     GLUCOSE, CAPILLARY     Status: Abnormal   Collection Time   03/06/11  9:07 PM      Component Value Range Comment   Glucose-Capillary 202 (*) 70 - 99 (mg/dL)    Comment 1 Notify RN     GLUCOSE, CAPILLARY     Status: Abnormal   Collection Time   03/07/11  6:30 AM      Component Value Range Comment   Glucose-Capillary 137 (*) 70 - 99 (mg/dL)     Treatment Plan Summary: Daily contact with patient to assess and evaluate symptoms and progress in treatment Medication management  Plan: Will continue patient on current treatment plan.           Continue patient on Q 15 minutes checks for safety.  Armandina Stammer I 03/07/2011, 12:10 PM

## 2011-03-08 LAB — COMPREHENSIVE METABOLIC PANEL
ALT: 19 U/L (ref 0–53)
Albumin: 3.7 g/dL (ref 3.5–5.2)
Alkaline Phosphatase: 104 U/L (ref 39–117)
Potassium: 4.8 mEq/L (ref 3.5–5.1)
Sodium: 117 mEq/L — CL (ref 135–145)
Total Protein: 7.7 g/dL (ref 6.0–8.3)

## 2011-03-08 LAB — GLUCOSE, CAPILLARY
Glucose-Capillary: 119 mg/dL — ABNORMAL HIGH (ref 70–99)
Glucose-Capillary: 157 mg/dL — ABNORMAL HIGH (ref 70–99)
Glucose-Capillary: 179 mg/dL — ABNORMAL HIGH (ref 70–99)

## 2011-03-08 NOTE — Discharge Planning (Signed)
Met with patient in Aftercare Planning Group.  He remains evasive about where he will go at discharge.  He attended Treatment Team, and again was evasive, although he seems to feel he is being very helpful.  Patient stated that his pastor is Elayne Snare at [employer] near [employer]  Case Manager researched and could not find a church by that name.  Did find [name] Elay[facility] Snare at [facility], which is not near [employer]  Did contact [employer], faxed to them the release form giving permission to talk with them, awaiting phone call back.  Confirmed to [facility] that patient still needs to be on state hospital wait list.  Per State Regulation 482.30  This chart was reviewed for medical necessity with respect to the patient's Admission/Duration of stay.   Next review due:  03/11/11   Ambrose Mantle, LCSW  03/08/2011  4:02 PM

## 2011-03-08 NOTE — Progress Notes (Signed)
Pt was in bed upon first assessment.  He was easily aroused by verbal stimuli.  He was given his self-inventory to fill out which again today he wrote BMO and check off most of the boxes on the sheet.  When asked, he denies any depression, hopelessness or anxiety.  He denied any S/H ideations or A/V hallucinations although this afternoon he seemed to be responding to internal stimuli.  He was talking out loud and cursing in his room.  He did give a name of a church in treatment team as a contact person for him. CM was unable to locate the church he was referring to but did find the pastor.  Pt is still on wait list for [facility] and was told this fact this morning.  Pt is still wanting CM to contact [employer] on his behalf and she is still waiting to hear back from them.  Pt has been med compliant but is noted to be drinking a lot of water.  Informed Dr. Allena Katz that pt is drinking a large amount of water and staff is trying to limit but pt continues to drink from the fountain.  He will be having a CMET along with a carbamazepine level will be drawn tonight.

## 2011-03-08 NOTE — Progress Notes (Signed)
[facility] MD Progress Note  03/08/2011 1:59 PM  Diagnosis:  Axis I: Schizoaffective Disorder - Bipolar Type.   The patient was seen today and reports the following:   Sleep: The patient again reports to sleeping well last night.  Appetite: Good.   Mild>(1-10) >Severe  Hopelessness (1-10): 0  Depression (1-10): 0  Anxiety (1-10): 0   Suicidal Ideation: The patient adamantly denies any suicidal ideations today.  Plan: No  Intent: No  Means: No   Homicidal Ideation: The patient adamantly denies any homicidal ideations today.  Plan: No  Intent: No.  Means: No   Eye Contact: Good.  General Appearance/Behavior: Neat and Casual  Motor Behavior: WNL today.  Speech: Mildly pressured.  Mental Status: Alert and Oriented x 3  Level of Consciousness: Alert  Mood: Euthymic.  Affect: Remains Mildly angry about not being allowed to discharge. Anxiety: None reported or noted.  Thought Process: Disorganized.  Thought Content: Grandiose Delusions continue but are improved.  Perception: Poor  Judgment: Poor.  Insight: Poor.  Cognition: Orientation time, place and person.  Sleep:  Number of Hours: 4.25   Vital Signs:Blood pressure 141/87, pulse 92, temperature 98.4 F (36.9 C), temperature source Oral, resp. rate 20, height 5\' 2"  (1.575 m), weight 88.905 kg (196 lb).  Lab Results:  Results for orders placed during the hospital encounter of 02/27/11 (from the past 48 hour(s))  GLUCOSE, CAPILLARY     Status: Abnormal   Collection Time   03/06/11  5:15 PM      Component Value Range Comment   Glucose-Capillary 131 (*) 70 - 99 (mg/dL)    Comment 1 Notify RN     GLUCOSE, CAPILLARY     Status: Abnormal   Collection Time   03/06/11  9:07 PM      Component Value Range Comment   Glucose-Capillary 202 (*) 70 - 99 (mg/dL)    Comment 1 Notify RN     GLUCOSE, CAPILLARY     Status: Abnormal   Collection Time   03/07/11  6:30 AM      Component Value Range Comment   Glucose-Capillary 137 (*) 70 -  99 (mg/dL)   GLUCOSE, CAPILLARY     Status: Abnormal   Collection Time   03/07/11 11:46 AM      Component Value Range Comment   Glucose-Capillary 134 (*) 70 - 99 (mg/dL)    Comment 1 Notify RN     GLUCOSE, CAPILLARY     Status: Abnormal   Collection Time   03/07/11  5:18 PM      Component Value Range Comment   Glucose-Capillary 133 (*) 70 - 99 (mg/dL)    Comment 1 Documented in Chart      Comment 2 Notify RN     GLUCOSE, CAPILLARY     Status: Abnormal   Collection Time   03/07/11  9:10 PM      Component Value Range Comment   Glucose-Capillary 165 (*) 70 - 99 (mg/dL)   GLUCOSE, CAPILLARY     Status: Abnormal   Collection Time   03/08/11  6:26 AM      Component Value Range Comment   Glucose-Capillary 119 (*) 70 - 99 (mg/dL)    Comment 1 Notify RN     GLUCOSE, CAPILLARY     Status: Abnormal   Collection Time   03/08/11 11:43 AM      Component Value Range Comment   Glucose-Capillary 157 (*) 70 - 99 (mg/dL)    Treatment Plan  Summary:  1. Daily contact with patient to assess and evaluate symptoms and progress in treatment  2. Medication management  3. The patient will deny suicidal ideations or homicidal ideations for 48 hours prior to discharge and have a depression and anxiety rating of 3 or less. The patient will also deny any auditory or visual hallucinations or delusional thinking or display any manic or hypomanic behaviors.   Plan:  1. Will continue current medications.  2. Labs ordered for tonight including CMP and Serum Tegretol Level.  3. Referral to [facility] in progress.  4. Continue to monitor. 5. Case Manager attempting to located placement.   Arretta Toenjes 03/08/2011, 1:59 PM

## 2011-03-08 NOTE — Progress Notes (Signed)
Recreation Therapy Group Note  Date: 03/08/2011         Time: 0930      Group Topic/Focus: The focus of this group is on enhancing the patient's understanding of leisure, barriers to leisure, and the importance of engaging in positive leisure activities upon discharge for improved total health.  Participation Level: Minimal  Participation Quality: Drowsy  Affect: Excited  Cognitive: Confused   Additional Comments: Patient sleeping (snoring) during group, but was easily waken. Walked in and out of group x2, talking about his "people," unable to follow the activity.  Bela Bonaparte 03/08/2011 11:56 AM

## 2011-03-08 NOTE — Progress Notes (Signed)
Pt. Was in bed resting with his eyes open.  Writer introduced self and pt. Was pleasant.  Pt. Denies SI, HI, and or AH.  None were noted at this time, but it had been reported that pt. Was in his room talking to self earlier.   Pt. Had the covers pulled up and appeared ready for a nap.  Writer told pt. That she would be his nurse for the night if he needed anything.  Pt. Denies any pain or discomfort at this time.

## 2011-03-08 NOTE — Tx Team (Signed)
Interdisciplinary Treatment Plan Update (Adult)  Date:  03/08/2011  Time Reviewed:  10:16 AM   Progress in Treatment: Attending groups:  Yes Participating in groups:    Yes, only sufficiently to answer questions Taking medication as prescribed:    Yes, no refusals Tolerating medication:   Yes, no side effects reported by patient or noted by staff Family/Significant other contact made:  No Patient understands diagnosis:    No Discussing patient identified problems/goals with staff:   No Medical problems stabilized or resolved:   Yes Denies suicidal/homicidal ideation:  Yes Issues/concerns per patient self-inventory:   None Other:  New problem(s) identified: No, Describe:    Reason for Continuation of Hospitalization: Delusions  Medication stabilization Determine safe placement  Interventions implemented related to continuation of hospitalization:  Medication monitoring and adjustment, safety checks Q15 min., group therapy, psychoeducation, collateral contact  Additional comments:  Not applicable  Estimated length of stay:  5-7 days  Discharge Plan:  Unknown where patient could go at discharge.  Patient has been placed on [facility] wait list  New goal(s):  Not applicable  Review of initial/current patient goals per problem list:   1.  Goal(s):  Reduce psychosis to baseline  Met:  No  Target date:  By Discharge   As evidenced by:  Still delusional  2.  Goal(s):  Patient will cooperate with aftercare placement  Met:  Yes  Target date:  By Discharge   As evidenced by:  Starting to cooperate more  Attendees: Patient:  Mitchell [name]  03/08/2011  10:16 AM   Family:     Physician:  Dr. Harvie Heck Readling 03/08/2011  10:16 AM   Nursing:   Robbie Louis, RN 03/08/2011  10:16 AM   Case Manager:  Ambrose Mantle, LCSW 03/08/2011  10:16 AM   Counselor:     Other:   Other:     Other:     Other:      Scribe for Treatment Team:   [name], 03/08/2011, 10:16 AM

## 2011-03-09 ENCOUNTER — Emergency Department (HOSPITAL_COMMUNITY)
Admission: EM | Admit: 2011-03-09 | Discharge: 2011-03-09 | Disposition: A | Discharge status: Nursing Home (Includes ICF) | Payer: Medicare Other | Source: Home / Self Care

## 2011-03-09 ENCOUNTER — Other Ambulatory Visit: Payer: Self-pay

## 2011-03-09 ENCOUNTER — Encounter (HOSPITAL_COMMUNITY): Payer: Self-pay | Admitting: Adult Health

## 2011-03-09 ENCOUNTER — Encounter (HOSPITAL_COMMUNITY): Payer: Self-pay

## 2011-03-09 LAB — CBC
MCH: 31.2 pg (ref 26.0–34.0)
Platelets: 157 10*3/uL (ref 150–400)
RBC: 4.33 MIL/uL (ref 4.22–5.81)

## 2011-03-09 LAB — POCT I-STAT, CHEM 8
BUN: 13 mg/dL (ref 6–23)
BUN: 11 mg/dL (ref 6–23)
Chloride: 84 mEq/L — ABNORMAL LOW (ref 96–112)
Chloride: 85 mEq/L — ABNORMAL LOW (ref 96–112)
Potassium: 4.4 mEq/L (ref 3.5–5.1)
Potassium: 4.6 mEq/L (ref 3.5–5.1)
Sodium: 118 mEq/L — CL (ref 135–145)
Sodium: 121 mEq/L — ABNORMAL LOW (ref 135–145)
TCO2: 28 mmol/L (ref 0–100)

## 2011-03-09 LAB — OSMOLALITY, URINE: Osmolality, Ur: 167 mOsm/kg — ABNORMAL LOW (ref 390–1090)

## 2011-03-09 LAB — CARBAMAZEPINE, FREE AND TOTAL
Carbamazepine Metabolite/: 0.1 ug/mL
Carbamazepine Metabolite: 0.2 ug/mL (ref 0.1–1.0)

## 2011-03-09 LAB — CARBAMAZEPINE LEVEL, TOTAL: Carbamazepine Lvl: 10.3 ug/mL (ref 4.0–12.0)

## 2011-03-09 LAB — CREATININE, SERUM: Creatinine, Ser: 0.96 mg/dL (ref 0.50–1.35)

## 2011-03-09 MED ORDER — SODIUM CHLORIDE 0.9 % IV SOLN
Freq: Once | INTRAVENOUS | Status: AC
  Administered 2011-03-09: 06:00:00 via INTRAVENOUS

## 2011-03-09 MED ORDER — INSULIN ASPART 100 UNIT/ML ~~LOC~~ SOLN
0.0000 [IU] | Freq: Three times a day (TID) | SUBCUTANEOUS | Status: DC
  Administered 2011-03-09: 2 [IU] via SUBCUTANEOUS
  Filled 2011-03-09: qty 1

## 2011-03-09 NOTE — ED Notes (Signed)
CBG 167 

## 2011-03-09 NOTE — H&P (Signed)
PCP:   Provider Not In System   Chief Complaint:  Low sodium  HPI: This is a 50 year old male who [facility], for low sodium. 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 Patient is sodium has improved from 117-121. Though his baseline sodium is around 125-130. Patient denies any blurred vision no history of passing out no chest pain or shortness of breath no nausea vomiting or diarrhea. Patient has a bipolar disorder and is also taking Tegretol 600 mg by mouth daily. Patient is a history of diverticulitis and takes glipizide.  Allergies:  No Known Allergies    Past Medical History  Diagnosis Date  . Hypertension   . Diabetes mellitus   . Mental disorder   . Bipolar affective disorder   . Schizoaffective disorder   . Hyponatremia     history of    History reviewed. No pertinent past surgical history.  Prior to Admission medications   Medication Sig Start Date End Date Taking? Authorizing Provider  amLODipine (NORVASC) 10 MG tablet Take 1 tablet (10 mg total) by mouth daily. 02/17/11 02/17/12  Viviann Spare, NP  aspirin 81 MG chewable tablet Chew 1 tablet (81 mg total) by mouth daily. 02/17/11 02/17/12  Viviann Spare, NP  benztropine (COGENTIN) 1 MG tablet Take 1 tablet (1 mg total) by mouth 2 (two) times daily. 02/17/11 02/17/12  Viviann Spare, NP  cloNIDine (CATAPRES) 0.1 MG tablet Take 1 tablet (0.1 mg total) by mouth 2 (two) times daily. 02/17/11 02/17/12  Viviann Spare, NP  glipiZIDE (GLUCOTROL XL) 5 MG 24 hr tablet Take 1 tablet (5 mg total) by mouth daily before breakfast. 02/17/11 02/17/12  Viviann Spare, NP  haloperidol (HALDOL) 10 MG tablet Take 1 tablet (10 mg total) by mouth at bedtime. 02/17/11 03/19/11  Viviann Spare, NP  haloperidol decanoate (HALDOL DECANOATE) 100 MG/ML  injection Inject 1 mL (100 mg total) into the muscle every 30 (thirty) days. 02/17/11   Viviann Spare, NP  insulin regular (HUMULIN R,NOVOLIN R) 100 units/mL injection Inject 2-5 Units into the skin 3 (three) times daily before meals. Dependant on blood sugar check.     Historical Provider, MD  lisinopril (PRINIVIL,ZESTRIL) 10 MG tablet Take 1 tablet (10 mg total) by mouth daily. 02/17/11 02/17/12  Viviann Spare, NP  metFORMIN (GLUCOPHAGE) 1000 MG tablet Take 1 tablet (1,000 mg total) by mouth 2 (two) times daily with a meal. 02/17/11 02/17/12  Viviann Spare, NP    Social History:  reports that he has quit smoking. His smoking use included Cigarettes. He quit after 5 years of use. He has never used smokeless tobacco. He reports that he does not drink alcohol or use illicit drugs.  History reviewed. No pertinent family history.  Review of Systems:  Constitutional: Denies fever, chills, diaphoresis, appetite change and fatigue.  HEENT: Denies photophobia, eye pain, redness, no blurred vision. Respiratory: Denies SOB, DOE, cough, chest tightness,  and wheezing.   Cardiovascular: Denies chest pain, palpitations and leg swelling.  Gastrointestinal: Denies nausea, vomiting, abdominal pain, diarrhea, constipation, blood in stool and abdominal distention.  Genitourinary: Denies dysuria, urgency, frequency, hematuria, flank pain and difficulty urinating.  Musculoskeletal: Denies myalgias, back pain, joint swelling, arthralgias and gait problem.  Neurological: Denies dizziness, seizures, syncope, weakness, light-headedness, numbness and headaches.  Psychiatric/Behavioral: Denies suicidal  ideation, hearing voices.   Physical Exam: Blood pressure 153/83, pulse 92, temperature 98.3 F (36.8 C), temperature source Oral, resp. rate 22, height 5\' 2"  (1.575 m), weight 88.905 kg (196 lb), SpO2 97.00%. Constitutional: Vital signs reviewed.  Patient is a well-developed and well-nourished male  in no  acute distress and cooperative with exam. Alert and oriented x3.  Head: Normocephalic and atraumatic Mouth: no erythema or exudates, MMM Eyes: PERRL, EOMI, conjunctivae normal, No scleral icterus.  Neck: Supple, Trachea midline normal ROM, No JVD, mass, thyromegaly, or carotid bruit present.  Cardiovascular: RRR, S1 normal, S2 normal, no MRG, pulses symmetric and intact bilaterally Pulmonary/Chest: CTAB, no wheezes, rales, or rhonchi Abdominal: Soft. Non-tender, non-distended, bowel sounds are normal, no masses, organomegaly, or guarding present.  Neurological: A&O x3, Strenght is normal and symmetric bilaterally, cranial nerve II-XII are grossly intact, no focal motor deficit, sensory intact to light touch bilaterally.  Skin: Warm, dry and intact. No rash, cyanosis, or clubbing.     Labs on Admission:  Results for orders placed during the hospital encounter of 02/27/11 (from the past 48 hour(s))  GLUCOSE, CAPILLARY     Status: Abnormal   Collection Time   03/07/11  5:18 PM      Component Value Range Comment   Glucose-Capillary 133 (*) 70 - 99 (mg/dL)    Comment 1 Documented in Chart      Comment 2 Notify RN     GLUCOSE, CAPILLARY     Status: Abnormal   Collection Time   03/07/11  9:10 PM      Component Value Range Comment   Glucose-Capillary 165 (*) 70 - 99 (mg/dL)   GLUCOSE, CAPILLARY     Status: Abnormal   Collection Time   03/08/11  6:26 AM      Component Value Range Comment   Glucose-Capillary 119 (*) 70 - 99 (mg/dL)    Comment 1 Notify RN     GLUCOSE, CAPILLARY     Status: Abnormal   Collection Time   03/08/11 11:43 AM      Component Value Range Comment   Glucose-Capillary 157 (*) 70 - 99 (mg/dL)   GLUCOSE, CAPILLARY     Status: Abnormal   Collection Time   03/08/11  4:59 PM      Component Value Range Comment   Glucose-Capillary 155 (*) 70 - 99 (mg/dL)   COMPREHENSIVE METABOLIC PANEL     Status: Abnormal   Collection Time   03/08/11  8:07 PM      Component Value  Range Comment   Sodium 117 (*) 135 - 145 (mEq/L)    Potassium 4.8  3.5 - 5.1 (mEq/L)    Chloride 81 (*) 96 - 112 (mEq/L)    CO2 30  19 - 32 (mEq/L)    Glucose, Bld 160 (*) 70 - 99 (mg/dL)    BUN 13  6 - 23 (mg/dL)    Creatinine, Ser 1.61  0.50 - 1.35 (mg/dL)    Calcium 9.2  8.4 - 10.5 (mg/dL)    Total Protein 7.7  6.0 - 8.3 (g/dL)    Albumin 3.7  3.5 - 5.2 (g/dL)    AST 19  0 - 37 (U/L)    ALT 19  0 - 53 (U/L)    Alkaline Phosphatase 104  39 - 117 (U/L)    Total Bilirubin 0.2 (*) 0.3 - 1.2 (mg/dL)    GFR calc non Af Amer >90  >90 (mL/min)    GFR calc Af Amer >  90  >90 (mL/min)   CARBAMAZEPINE LEVEL, TOTAL     Status: Normal   Collection Time   03/08/11  8:07 PM      Component Value Range Comment   Carbamazepine Lvl 10.3  4.0 - 12.0 (ug/mL)   GLUCOSE, CAPILLARY     Status: Abnormal   Collection Time   03/08/11  9:10 PM      Component Value Range Comment   Glucose-Capillary 179 (*) 70 - 99 (mg/dL)    Comment 1 Notify RN     POCT I-STAT, CHEM 8     Status: Abnormal   Collection Time   03/09/11  3:25 AM      Component Value Range Comment   Sodium 118 (*) 135 - 145 (mEq/L)    Potassium 4.4  3.5 - 5.1 (mEq/L)    Chloride 84 (*) 96 - 112 (mEq/L)    BUN 13  6 - 23 (mg/dL)    Creatinine, Ser 4.09  0.50 - 1.35 (mg/dL)    Glucose, Bld 811 (*) 70 - 99 (mg/dL)    Calcium, Ion 9.14 (*) 1.12 - 1.32 (mmol/L)    TCO2 27  0 - 100 (mmol/L)    Hemoglobin 14.6  13.0 - 17.0 (g/dL)    HCT 78.2  95.6 - 21.3 (%)    Comment NOTIFIED PHYSICIAN     POCT I-STAT, CHEM 8     Status: Abnormal   Collection Time   03/09/11 10:12 AM      Component Value Range Comment   Sodium 121 (*) 135 - 145 (mEq/L)    Potassium 4.6  3.5 - 5.1 (mEq/L)    Chloride 85 (*) 96 - 112 (mEq/L)    BUN 11  6 - 23 (mg/dL)    Creatinine, Ser 0.86  0.50 - 1.35 (mg/dL)    Glucose, Bld 578 (*) 70 - 99 (mg/dL)    Calcium, Ion 4.69 (*) 1.12 - 1.32 (mmol/L)    TCO2 28  0 - 100 (mmol/L)    Hemoglobin 13.9  13.0 - 17.0 (g/dL)     HCT 62.9  52.8 - 41.3 (%)      Assessment/Plan  Hyponatremia Will obtain serum and urine osmolality,  May be secondary to Carbamazepine. Will put on fluid restriction 1200 ml/day  Diabetes mellitus Will start SSI. Glucotrol and metformin.  Hypertension Continue Clonidine and Lisinopril, amlodipine.  DVT prophylaxis SCD's   Time Spent on Admission: 50 min  Austyn Perriello S [facility] Pager: [phone] 03/09/2011, 12:26 PM

## 2011-03-09 NOTE — ED Provider Notes (Signed)
Pt transferred [facility] ED from [facility] for  Hyponatremia. 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 The patients NA was 117 upon arrival, after sodium chloride infusions NA is only 121. Behavioral health is unable to monitor sodium appropriately to bring pt to baseline of 125-130.    Pt admitted to [facility], Team 6, Dr. Sharl Ma, inpatient med/surg  Dorthula Matas, [place] 03/09/11 1054

## 2011-03-09 NOTE — ED Provider Notes (Signed)
Medical screening examination/treatment/procedure(s) were performed by non-physician practitioner and as supervising physician I was immediately available for consultation/collaboration.   Lyanne Co, MD 03/09/11 2229

## 2011-03-09 NOTE — ED Notes (Signed)
Sent from [facility] due to low sodium. Pt states he feels great. Alert and oriented.

## 2011-03-09 NOTE — ED Provider Notes (Signed)
History     CSN: [id number] Arrival date & time: 02/27/2011  4:22 PM   First MD Initiated Contact with Patient 03/09/11 0228      No chief complaint on file.   (Consider location/radiation/quality/duration/timing/severity/associated sxs/prior treatment) HPI Comments: Mitchell [name] was [facility].  Due to the low sodium.  He is schizophrenic and has a habit of drinking water to, access he's had multiple evaluations for hyponatremia.  He just does not understand that he cannot drink water freely.  He states he drinks it, as he's thirsty.  Denies any shift chest pain, shortness of breath, abdominal pain, dysuria.  He does report frequency  The history is provided by the patient.    Past Medical History  Diagnosis Date  . Hypertension   . Diabetes mellitus   . Mental disorder   . Bipolar affective disorder   . Schizoaffective disorder   . Hyponatremia     history of    History reviewed. No pertinent past surgical history.  History reviewed. No pertinent family history.  History  Substance Use Topics  . Smoking status: Former Smoker -- 5 years    Types: Cigarettes  . Smokeless tobacco: Never Used  . Alcohol Use: No      Review of Systems  Constitutional: Negative.   HENT: Negative.   Eyes: Negative.   Respiratory: Negative.   Cardiovascular: Negative.   Gastrointestinal: Negative.   Genitourinary: Positive for frequency. Negative for dysuria.  Musculoskeletal: Negative.   Neurological: Negative for dizziness and weakness.  Hematological: Negative.   Psychiatric/Behavioral: Negative.     Allergies  Review of patient's allergies indicates no known allergies.  Home Medications   No current outpatient prescriptions on file.  BP 150/84  Pulse 100  Temp(Src) 98.6 F (37 C) (Oral)  Resp 18  Ht 5\' 2"  (1.575 m)  Wt 196 lb (88.905 kg)  BMI 35.85 kg/m2  SpO2 99%  Physical Exam  Constitutional: He is oriented to person, place,  and time. He appears well-developed and well-nourished.  HENT:  Head: Normocephalic.  Neck: Normal range of motion.  Cardiovascular: Normal rate.   Pulmonary/Chest: Effort normal.  Abdominal: Soft.  Musculoskeletal: Normal range of motion.  Neurological: He is oriented to person, place, and time.  Skin: Skin is warm and dry.  Psychiatric: He has a normal mood and affect.    ED Course  Procedures (including critical care time)  Labs Reviewed  GLUCOSE, CAPILLARY - Abnormal; Notable for the following:    Glucose-Capillary 105 (*)    All other components within normal limits  GLUCOSE, CAPILLARY - Abnormal; Notable for the following:    Glucose-Capillary 128 (*)    All other components within normal limits  GLUCOSE, CAPILLARY - Abnormal; Notable for the following:    Glucose-Capillary 104 (*)    All other components within normal limits  GLUCOSE, CAPILLARY - Abnormal; Notable for the following:    Glucose-Capillary 108 (*)    All other components within normal limits  GLUCOSE, CAPILLARY - Abnormal; Notable for the following:    Glucose-Capillary 147 (*)    All other components within normal limits  GLUCOSE, CAPILLARY - Abnormal; Notable for the following:    Glucose-Capillary 110 (*)    All other components within normal limits  GLUCOSE, CAPILLARY - Abnormal; Notable for the following:    Glucose-Capillary 133 (*)    All other components within normal limits  GLUCOSE, CAPILLARY - Abnormal; Notable for the following:  Glucose-Capillary 164 (*)    All other components within normal limits  GLUCOSE, CAPILLARY - Abnormal; Notable for the following:    Glucose-Capillary 158 (*)    All other components within normal limits  GLUCOSE, CAPILLARY - Abnormal; Notable for the following:    Glucose-Capillary 115 (*)    All other components within normal limits  GLUCOSE, CAPILLARY - Abnormal; Notable for the following:    Glucose-Capillary 159 (*)    All other components within normal  limits  GLUCOSE, CAPILLARY - Abnormal; Notable for the following:    Glucose-Capillary 134 (*)    All other components within normal limits  COMPREHENSIVE METABOLIC PANEL - Abnormal; Notable for the following:    Sodium 125 (*)    Chloride 88 (*)    Glucose, Bld 139 (*)    Total Bilirubin 0.1 (*)    All other components within normal limits  CBC - Abnormal; Notable for the following:    HCT 37.8 (*)    All other components within normal limits  VALPROIC ACID LEVEL - Abnormal; Notable for the following:    Valproic Acid Lvl 41.9 (*)    All other components within normal limits  GLUCOSE, CAPILLARY - Abnormal; Notable for the following:    Glucose-Capillary 151 (*)    All other components within normal limits  GLUCOSE, CAPILLARY - Abnormal; Notable for the following:    Glucose-Capillary 176 (*)    All other components within normal limits  GLUCOSE, CAPILLARY - Abnormal; Notable for the following:    Glucose-Capillary 187 (*)    All other components within normal limits  COMPREHENSIVE METABOLIC PANEL - Abnormal; Notable for the following:    Sodium 124 (*)    Chloride 87 (*)    Glucose, Bld 158 (*)    Total Bilirubin 0.2 (*)    GFR calc non Af Amer 75 (*)    GFR calc Af Amer 87 (*)    All other components within normal limits  CBC - Abnormal; Notable for the following:    RBC 4.05 (*)    Hemoglobin 12.7 (*)    HCT 35.7 (*)    All other components within normal limits  GLUCOSE, CAPILLARY - Abnormal; Notable for the following:    Glucose-Capillary 152 (*)    All other components within normal limits  GLUCOSE, CAPILLARY - Abnormal; Notable for the following:    Glucose-Capillary 177 (*)    All other components within normal limits  GLUCOSE, CAPILLARY - Abnormal; Notable for the following:    Glucose-Capillary 127 (*)    All other components within normal limits  GLUCOSE, CAPILLARY - Abnormal; Notable for the following:    Glucose-Capillary 131 (*)    All other components  within normal limits  GLUCOSE, CAPILLARY - Abnormal; Notable for the following:    Glucose-Capillary 202 (*)    All other components within normal limits  GLUCOSE, CAPILLARY - Abnormal; Notable for the following:    Glucose-Capillary 137 (*)    All other components within normal limits  GLUCOSE, CAPILLARY - Abnormal; Notable for the following:    Glucose-Capillary 134 (*)    All other components within normal limits  GLUCOSE, CAPILLARY - Abnormal; Notable for the following:    Glucose-Capillary 133 (*)    All other components within normal limits  GLUCOSE, CAPILLARY - Abnormal; Notable for the following:    Glucose-Capillary 165 (*)    All other components within normal limits  GLUCOSE, CAPILLARY - Abnormal; Notable for the  following:    Glucose-Capillary 119 (*)    All other components within normal limits  GLUCOSE, CAPILLARY - Abnormal; Notable for the following:    Glucose-Capillary 157 (*)    All other components within normal limits  COMPREHENSIVE METABOLIC PANEL - Abnormal; Notable for the following:    Sodium 117 (*)    Chloride 81 (*)    Glucose, Bld 160 (*)    Total Bilirubin 0.2 (*)    All other components within normal limits  GLUCOSE, CAPILLARY - Abnormal; Notable for the following:    Glucose-Capillary 155 (*)    All other components within normal limits  GLUCOSE, CAPILLARY - Abnormal; Notable for the following:    Glucose-Capillary 179 (*)    All other components within normal limits  POCT I-STAT, CHEM 8 - Abnormal; Notable for the following:    Sodium 118 (*)    Chloride 84 (*)    Glucose, Bld 116 (*)    Calcium, Ion 1.10 (*)    All other components within normal limits  POCT I-STAT, CHEM 8 - Abnormal; Notable for the following:    Sodium 121 (*)    Chloride 85 (*)    Glucose, Bld 164 (*)    Calcium, Ion 1.10 (*)    All other components within normal limits  OSMOLALITY, URINE - Abnormal; Notable for the following:    Osmolality, Ur 167 (*)    All other  components within normal limits  CBC - Abnormal; Notable for the following:    WBC 3.9 (*)    HCT 37.5 (*)    All other components within normal limits  GLUCOSE, CAPILLARY  GLUCOSE, CAPILLARY  GLUCOSE, CAPILLARY  GLUCOSE, CAPILLARY  DIFFERENTIAL  T3, FREE  T4, FREE  GLUCOSE, CAPILLARY  GLUCOSE, CAPILLARY  GLUCOSE, CAPILLARY  DIFFERENTIAL  CARBAMAZEPINE LEVEL, TOTAL  CARBAMAZEPINE LEVEL, FREE  GLUCOSE, CAPILLARY  GLUCOSE, CAPILLARY  GLUCOSE, CAPILLARY  CARBAMAZEPINE LEVEL, TOTAL  CREATININE, SERUM  HEMOGLOBIN A1C   No results found. ED ECG REPORT   Date: 03/10/2011  EKG Time: 4:30 AM  Rate: 95  Rhythm: normal sinus rhythm,  normal EKG, normal sinus rhythm, unchanged from previous tracings  Axis: normal  Intervals:none  ST&T Change:  Borderline T wave abnormalities  Narrative Interpretation: normal             1. Hyponatremia     Plan discharge this patient will need to be observed closely and prevented from drinking water freely to avoid water intoxication in the future  MDM  Hyponatremia to to excess water consumption due to this patient's psychiatric illness        Arman Filter, NP 03/09/11 0554  Arman Filter, NP 03/10/11 0429  Arman Filter, NP 03/10/11 0430

## 2011-03-09 NOTE — ED Notes (Signed)
Attempted to call report to [facility], was told they'll call back

## 2011-03-10 ENCOUNTER — Inpatient Hospital Stay (HOSPITAL_COMMUNITY)
Admission: EM | Admit: 2011-03-10 | Discharge: 2011-03-19 | DRG: 641 | Disposition: A | Discharge status: Other Acute Inpatient Hospital | Payer: Medicare Other | Source: Ambulatory Visit | Attending: Internal Medicine | Admitting: Internal Medicine

## 2011-03-10 DIAGNOSIS — F250 Schizoaffective disorder, bipolar type: Secondary | ICD-10-CM

## 2011-03-10 DIAGNOSIS — E871 Hypo-osmolality and hyponatremia: Secondary | ICD-10-CM

## 2011-03-10 DIAGNOSIS — F259 Schizoaffective disorder, unspecified: Secondary | ICD-10-CM

## 2011-03-10 LAB — HEMOGLOBIN A1C
Hgb A1c MFr Bld: 6.8 % — ABNORMAL HIGH (ref <5.7)
Mean Plasma Glucose: 148 mg/dL — ABNORMAL HIGH (ref <117)

## 2011-03-10 LAB — GLUCOSE, CAPILLARY
Glucose-Capillary: 98 mg/dL (ref 70–99)
Glucose-Capillary: 93 mg/dL (ref 70–99)

## 2011-03-10 LAB — BASIC METABOLIC PANEL
BUN: 10 mg/dL (ref 6–23)
CO2: 28 mEq/L (ref 19–32)
Glucose, Bld: 195 mg/dL — ABNORMAL HIGH (ref 70–99)
Potassium: 5.4 mEq/L — ABNORMAL HIGH (ref 3.5–5.1)
Sodium: 117 mEq/L — CL (ref 135–145)

## 2011-03-10 LAB — CBC
MCV: 86.7 fL (ref 78.0–100.0)
WBC: 3.3 10*3/uL — ABNORMAL LOW (ref 4.0–10.5)

## 2011-03-10 MED ORDER — METFORMIN HCL 500 MG PO TABS
1000.0000 mg | ORAL_TABLET | Freq: Two times a day (BID) | ORAL | Status: DC
  Administered 2011-03-10 – 2011-03-11 (×4): 1000 mg via ORAL
  Filled 2011-03-10 (×7): qty 2

## 2011-03-10 MED ORDER — ASPIRIN 81 MG PO CHEW: 81.0000 mg | CHEWABLE_TABLET | Freq: Every day | ORAL | Status: DC

## 2011-03-10 MED ORDER — HALOPERIDOL 5 MG PO TABS: 10.0000 mg | ORAL_TABLET | Freq: Every day | ORAL | Status: DC

## 2011-03-10 MED ORDER — SODIUM CHLORIDE 1 G PO TABS
1.0000 g | ORAL_TABLET | Freq: Three times a day (TID) | ORAL | Status: DC
  Administered 2011-03-10 – 2011-03-16 (×19): 1 g via ORAL
  Filled 2011-03-10 (×21): qty 1

## 2011-03-10 MED ORDER — HALOPERIDOL LACTATE 2 MG/ML PO CONC
20.0000 mg | Freq: Once | ORAL | Status: DC
  Filled 2011-03-10: qty 10

## 2011-03-10 MED ORDER — LISINOPRIL 10 MG PO TABS
10.0000 mg | ORAL_TABLET | Freq: Every day | ORAL | Status: DC
  Administered 2011-03-10 – 2011-03-19 (×8): 10 mg via ORAL
  Filled 2011-03-10 (×10): qty 1

## 2011-03-10 MED ORDER — BENZTROPINE MESYLATE 1 MG PO TABS: 1.0000 mg | ORAL_TABLET | Freq: Two times a day (BID) | ORAL | Status: DC

## 2011-03-10 MED ORDER — SODIUM CHLORIDE 0.9 % IV SOLN
INTRAVENOUS | Status: DC
  Administered 2011-03-10 – 2011-03-11 (×2): via INTRAVENOUS

## 2011-03-10 MED ORDER — INSULIN REGULAR HUMAN 100 UNIT/ML IJ SOLN: 2.0000 [IU] | Freq: Three times a day (TID) | INTRAMUSCULAR | Status: DC

## 2011-03-10 MED ORDER — ACETAMINOPHEN 325 MG PO TABS: 650.0000 mg | ORAL_TABLET | Freq: Four times a day (QID) | ORAL | Status: DC | PRN

## 2011-03-10 MED ORDER — AMLODIPINE BESYLATE 10 MG PO TABS: 10.0000 mg | ORAL_TABLET | Freq: Every day | ORAL | Status: DC

## 2011-03-10 MED ORDER — METFORMIN HCL 500 MG PO TABS: 1000.0000 mg | ORAL_TABLET | Freq: Two times a day (BID) | ORAL | Status: DC

## 2011-03-10 MED ORDER — HALOPERIDOL DECANOATE 100 MG/ML IM SOLN: 100.0000 mg | INTRAMUSCULAR | Status: DC

## 2011-03-10 MED ORDER — ASPIRIN 81 MG PO CHEW
81.0000 mg | CHEWABLE_TABLET | Freq: Once | ORAL | Status: DC
  Filled 2011-03-10: qty 1

## 2011-03-10 MED ORDER — SENNA 8.6 MG PO TABS
1.0000 | ORAL_TABLET | Freq: Two times a day (BID) | ORAL | Status: DC
  Administered 2011-03-10 – 2011-03-19 (×16): 8.6 mg via ORAL
  Filled 2011-03-10 (×14): qty 1

## 2011-03-10 MED ORDER — BENZTROPINE MESYLATE 1 MG PO TABS
1.0000 mg | ORAL_TABLET | Freq: Two times a day (BID) | ORAL | Status: DC
  Administered 2011-03-10 – 2011-03-19 (×19): 1 mg via ORAL
  Filled 2011-03-10 (×21): qty 1

## 2011-03-10 MED ORDER — CARBAMAZEPINE 200 MG PO TABS
200.0000 mg | ORAL_TABLET | Freq: Once | ORAL | Status: DC
  Filled 2011-03-10: qty 1

## 2011-03-10 MED ORDER — SODIUM POLYSTYRENE SULFONATE 15 GM/60ML PO SUSP
15.0000 g | Freq: Once | ORAL | Status: AC
  Administered 2011-03-10: 15 g via ORAL
  Filled 2011-03-10: qty 60

## 2011-03-10 MED ORDER — LISINOPRIL 20 MG PO TABS
20.0000 mg | ORAL_TABLET | ORAL | Status: DC
  Filled 2011-03-10 (×2): qty 1

## 2011-03-10 MED ORDER — ONDANSETRON HCL 4 MG PO TABS: 4.0000 mg | ORAL_TABLET | Freq: Four times a day (QID) | ORAL | Status: DC | PRN

## 2011-03-10 MED ORDER — ONDANSETRON HCL 4 MG/2ML IJ SOLN: 4.0000 mg | Freq: Four times a day (QID) | INTRAMUSCULAR | Status: DC | PRN

## 2011-03-10 MED ORDER — GLIPIZIDE ER 5 MG PO TB24: 5.0000 mg | ORAL_TABLET | Freq: Every day | ORAL | Status: DC

## 2011-03-10 MED ORDER — HALOPERIDOL 5 MG PO TABS
10.0000 mg | ORAL_TABLET | Freq: Two times a day (BID) | ORAL | Status: DC
  Administered 2011-03-10 – 2011-03-19 (×19): 10 mg via ORAL
  Filled 2011-03-10 (×4): qty 2
  Filled 2011-03-10: qty 1
  Filled 2011-03-10 (×6): qty 2
  Filled 2011-03-10: qty 1
  Filled 2011-03-10: qty 2
  Filled 2011-03-10: qty 1
  Filled 2011-03-10: qty 2
  Filled 2011-03-10: qty 1
  Filled 2011-03-10 (×10): qty 2

## 2011-03-10 MED ORDER — CLONAZEPAM 1 MG PO TABS
1.0000 mg | ORAL_TABLET | Freq: Two times a day (BID) | ORAL | Status: DC
  Administered 2011-03-10 – 2011-03-19 (×19): 1 mg via ORAL
  Filled 2011-03-10 (×19): qty 1

## 2011-03-10 MED ORDER — GLIPIZIDE ER 5 MG PO TB24
5.0000 mg | ORAL_TABLET | Freq: Every day | ORAL | Status: DC
  Administered 2011-03-10 – 2011-03-19 (×10): 5 mg via ORAL
  Filled 2011-03-10 (×10): qty 1

## 2011-03-10 MED ORDER — ENOXAPARIN SODIUM 40 MG/0.4ML ~~LOC~~ SOLN
40.0000 mg | SUBCUTANEOUS | Status: DC
  Administered 2011-03-10 – 2011-03-19 (×7): 40 mg via SUBCUTANEOUS
  Filled 2011-03-10 (×10): qty 0.4

## 2011-03-10 MED ORDER — AMLODIPINE BESYLATE 10 MG PO TABS
10.0000 mg | ORAL_TABLET | Freq: Every day | ORAL | Status: DC
  Administered 2011-03-10 – 2011-03-19 (×8): 10 mg via ORAL
  Filled 2011-03-10 (×10): qty 1

## 2011-03-10 MED ORDER — CLONIDINE HCL 0.1 MG PO TABS
0.1000 mg | ORAL_TABLET | Freq: Two times a day (BID) | ORAL | Status: DC
  Administered 2011-03-10 – 2011-03-19 (×18): 0.1 mg via ORAL
  Filled 2011-03-10 (×21): qty 1

## 2011-03-10 NOTE — ED Provider Notes (Signed)
Medical screening examination/treatment/procedure(s) were performed by non-physician practitioner and as supervising physician I was immediately available for consultation/collaboration.   Lyanne Co, MD 03/10/11 [phone]

## 2011-03-10 NOTE — Progress Notes (Signed)
Physical Therapy Note Consult received and chart reviewed.  No physical therapy needs per patient who was standing in his room drinking water.  Will discontinue due to no needs.  Thanks. [facility], [place] [phone] 03/10/2011

## 2011-03-10 NOTE — Progress Notes (Signed)
Patient ID: Mitchell [name], male   DOB: 1961-02-24, 50 y.o.   MRN: [id number]  Subjective: No events overnight. Patient denies chest pain, shortness of breath, abdominal pain. Had bowel movement and reports ambulating.  Objective:  Vital signs in last 24 hours:  Filed Vitals:   03/10/11 0012 03/10/11 1542  BP: 131/83 151/84  Pulse: 84 92  Temp: 98.6 F (37 C) 98.2 F (36.8 C)  TempSrc: Oral Oral  Resp: 16 17  Height:  5\' 2"  (1.575 m)  SpO2: 96% 98%    Intake/Output from previous day:   Intake/Output Summary (Last 24 hours) at 03/10/11 2211 Last data filed at 03/10/11 0700  Gross per 24 hour  Intake    240 ml  Output      0 ml  Net    240 ml    Physical Exam: General: Alert, awake, oriented x3, in no acute distress. HEENT: No bruits, no goiter. Moist mucous membranes, no scleral icterus, no conjunctival pallor. Heart: Regular rate and rhythm, without murmurs, rubs, gallops. Lungs: Clear to auscultation bilaterally. No wheezing, no rhonchi, no rales.  Abdomen: Soft, nontender, nondistended, positive bowel sounds. Extremities: No clubbing cyanosis or edema,  positive pedal pulses. Neuro: Grossly intact, nonfocal.  Lab Results:  Basic Metabolic Panel:    Component Value Date/Time   NA 117* 03/10/2011 0950   K 5.4* 03/10/2011 0950   CL 83* 03/10/2011 0950   CO2 28 03/10/2011 0950   BUN 10 03/10/2011 0950   CREATININE 0.92 03/10/2011 0950   GLUCOSE 195* 03/10/2011 0950   CALCIUM 9.0 03/10/2011 0950   CBC:    Component Value Date/Time   WBC 3.3* 03/10/2011 0950   HGB 13.6 03/10/2011 0950   HCT 37.7* 03/10/2011 0950   PLT 153 03/10/2011 0950   MCV 86.7 03/10/2011 0950   NEUTROABS 3.2 03/05/2011 1955   LYMPHSABS 1.2 03/05/2011 1955   MONOABS 0.5 03/05/2011 1955   EOSABS 0.2 03/05/2011 1955   BASOSABS 0.0 03/05/2011 1955      Lab 03/10/11 0950 03/09/11 1600 03/09/11 1012 03/09/11 0325 03/05/11 1955  WBC 3.3* 3.9* -- -- 5.1  HGB 13.6 13.5 13.9 14.6 12.7*  HCT  37.7* 37.5* 41.0 43.0 35.7*  PLT 153 157 -- -- 167  MCV 86.7 86.6 -- -- 88.1  MCH 31.3 31.2 -- -- 31.4  MCHC 36.1* 36.0 -- -- 35.6  RDW 12.2 12.2 -- -- 12.3  LYMPHSABS -- -- -- -- 1.2  MONOABS -- -- -- -- 0.5  EOSABS -- -- -- -- 0.2  BASOSABS -- -- -- -- 0.0  BANDABS -- -- -- -- --    Lab 03/10/11 0950 03/09/11 1600 03/09/11 1012 03/09/11 0325 03/08/11 2007 03/05/11 1955  NA 117* -- 121* 118* 117* 124*  K 5.4* -- 4.6 4.4 4.8 4.7  CL 83* -- 85* 84* 81* 87*  CO2 28 -- -- -- 30 30  GLUCOSE 195* -- 164* 116* 160* 158*  BUN 10 -- 11 13 13 14   CREATININE 0.92 0.96 0.80 0.80 0.84 --  CALCIUM 9.0 -- -- -- 9.2 9.1  MG -- -- -- -- -- --   No results found for this basename: INR:5,PROTIME:5 in the last 168 hours Cardiac markers: No results found for this basename: CK:3,CKMB:3,TROPONINI:3,MYOGLOBIN:3 in the last 168 hours No components found with this basename: POCBNP:3 No results found for this or any previous visit (from the past 240 hour(s)).  Studies/Results: No results found.  Medications: Scheduled Meds:   . amLODipine  10 mg Oral Daily  . aspirin  81 mg Oral Once  . benztropine  1 mg Oral BID  . carbamazepine  200 mg Oral Once  . clonazePAM  1 mg Oral BID  . cloNIDine  0.1 mg Oral BID  . enoxaparin  40 mg Subcutaneous Q24H  . glipiZIDE  5 mg Oral Q breakfast  . haloperidol  10 mg Oral BID  . insulin regular  2-5 Units Subcutaneous TID AC  . lisinopril  10 mg Oral Daily  . metFORMIN  1,000 mg Oral BID  . senna  1 tablet Oral BID  . sodium chloride  1 g Oral TID WC  . DISCONTD: amLODipine  10 mg Oral Daily  . DISCONTD: aspirin  81 mg Oral Daily  . DISCONTD: benztropine  1 mg Oral BID  . DISCONTD: glipiZIDE  5 mg Oral QAC breakfast  . DISCONTD: haloperidol  20 mg Oral Once  . DISCONTD: haloperidol  10 mg Oral QHS  . DISCONTD: haloperidol decanoate  100 mg Intramuscular Q30 days  . DISCONTD: lisinopril  20 mg Oral 1 day or 1 dose  . DISCONTD: metFORMIN  1,000 mg Oral  BID WC   Continuous Infusions:   . sodium chloride 75 mL/hr at 03/10/11 1726   PRN Meds:.acetaminophen, ondansetron (ZOFRAN) IV, ondansetron  Assessment/Plan:  Hyponatremia  May be secondary to Carbamazepine.  Fluid restriction did not seem to improved sodium level PT is mentally stable with no focal neurologic findings Pt also does not appear to be volume depleted Follow BMP in AM  Diabetes mellitus  Will start SSI.  Glucotrol and metformin.   Hypertension  Continue Clonidine and Lisinopril, amlodipine.   DVT prophylaxis  SCD's       LOS: 0 days   MAGICK-Evelin Cake 03/10/2011, 10:11 PM

## 2011-03-10 NOTE — Discharge Planning (Signed)
A bed has been offered at [facility] if patient's vitals and sodium level are appropriate.  Please advise [facility] Case Manager whether patient is close to being discharged back to [facility] and could go to the state hospital instead.  Ambrose Mantle, LCSW 03/10/2011, 9:15 AM

## 2011-03-10 NOTE — Consult Note (Signed)
Patient Identification:  Mitchell [name] Date of Evaluation:  03/10/2011   History of Present Illness:  [name] with history of schizoaffective disorder with multiple hospitalization was admitted from behavioral health because of the low sodium. Patient has already been accepted at [facility] once his SODIUM  is within normal limits patient can be sent to state hospital patient will not go back to [facility] .  Mental Status Examination: Patient is alert awake oriented to place and person but not to time he still disorganized but calm cooperative and pleasant during the interview and denies hearing voices denies suicidal or homicidal ideations. Insight and judgment poor attention concentration poor abstract ability poor   Past Medical History:     Past Medical History  Diagnosis Date  . Hypertension   . Diabetes mellitus   . Mental disorder   . Bipolar affective disorder   . Schizoaffective disorder   . Hyponatremia     history of      No past surgical history on file.  Allergies: No Known Allergies  Current Medications:  Prior to Admission medications   Medication Sig Start Date End Date Taking? Authorizing Provider  amLODipine (NORVASC) 10 MG tablet Take 1 tablet (10 mg total) by mouth daily. 02/17/11 02/17/12  Viviann Spare, NP  aspirin 81 MG chewable tablet Chew 1 tablet (81 mg total) by mouth daily. 02/17/11 02/17/12  Viviann Spare, NP  benztropine (COGENTIN) 1 MG tablet Take 1 tablet (1 mg total) by mouth 2 (two) times daily. 02/17/11 02/17/12  Viviann Spare, NP  cloNIDine (CATAPRES) 0.1 MG tablet Take 1 tablet (0.1 mg total) by mouth 2 (two) times daily. 02/17/11 02/17/12  Viviann Spare, NP  glipiZIDE (GLUCOTROL XL) 5 MG 24 hr tablet Take 1 tablet (5 mg total) by mouth daily before breakfast. 02/17/11 02/17/12  Viviann Spare, NP  haloperidol (HALDOL) 10 MG tablet Take 1 tablet (10 mg total) by mouth at bedtime. 02/17/11  03/19/11  Viviann Spare, NP  haloperidol decanoate (HALDOL DECANOATE) 100 MG/ML injection Inject 1 mL (100 mg total) into the muscle every 30 (thirty) days. 02/17/11   Viviann Spare, NP  insulin regular (HUMULIN R,NOVOLIN R) 100 units/mL injection Inject 2-5 Units into the skin 3 (three) times daily before meals. Dependant on blood sugar check.     Historical Provider, MD  lisinopril (PRINIVIL,ZESTRIL) 10 MG tablet Take 1 tablet (10 mg total) by mouth daily. 02/17/11 02/17/12  Viviann Spare, NP  metFORMIN (GLUCOPHAGE) 1000 MG tablet Take 1 tablet (1,000 mg total) by mouth 2 (two) times daily with a meal. 02/17/11 02/17/12  Viviann Spare, NP    Social History:    reports that he has quit smoking. His smoking use included Cigarettes. He quit after 5 years of use. He has never used smokeless tobacco. He reports that he does not drink alcohol or use illicit drugs.   Family History:    No family history on file.   DIAGNOSIS:   AXIS I  schizoaffective disorder   AXIS II  Deffered  AXIS III See medical notes.  AXIS IV  chronic mental health issues   AXIS V 40     Recommendations: Once medically stable patient can go to [facility]. I told the clinical social worker Marchelle Folks about  patient going to the state hospital.    Eulogio Ditch, MD

## 2011-03-10 NOTE — Progress Notes (Signed)
Critical lab result called; patients Sodium level resulted as 127meQ/L. Dr. Theresia Bough was text paged with the results. Will continue to monitor.

## 2011-03-11 LAB — GLUCOSE, CAPILLARY
Glucose-Capillary: 99 mg/dL (ref 70–99)
Glucose-Capillary: 139 mg/dL — ABNORMAL HIGH (ref 70–99)
Glucose-Capillary: 119 mg/dL — ABNORMAL HIGH (ref 70–99)

## 2011-03-11 LAB — BASIC METABOLIC PANEL
CO2: 27 mEq/L (ref 19–32)
Chloride: 89 mEq/L — ABNORMAL LOW (ref 96–112)
GFR calc Af Amer: >90 mL/min (ref >90)
Potassium: 4.8 mEq/L (ref 3.5–5.1)

## 2011-03-11 LAB — CBC
HCT: 35.9 % — ABNORMAL LOW (ref 39.0–52.0)
Hemoglobin: 12.3 g/dL — ABNORMAL LOW (ref 13.0–17.0)
RBC: 4.16 MIL/uL — ABNORMAL LOW (ref 4.22–5.81)
WBC: 3.7 10*3/uL — ABNORMAL LOW (ref 4.0–10.5)

## 2011-03-11 MED ORDER — METFORMIN HCL 500 MG PO TABS
1000.0000 mg | ORAL_TABLET | Freq: Two times a day (BID) | ORAL | Status: DC
  Administered 2011-03-12 – 2011-03-19 (×15): 1000 mg via ORAL
  Filled 2011-03-11 (×16): qty 2

## 2011-03-11 NOTE — Progress Notes (Signed)
RN requested that CSW attempt to de-escalate Pt, as Pt upset that he isn't transferring to [facility] today.  Spoke with Pt who was tangential, agitated and difficult to understand.  Explained to Pt that he is on the top of the waiting list at [facility] and that he may be able to d/c there tomorrow.  Pt calmed down and voiced an understanding of CSW's visit.  CSw to f/u tomorrow.  Providence Crosby, LCSWA Clinical Social Work [phone]

## 2011-03-11 NOTE — Progress Notes (Signed)
[facility] and was informed by RN supervisor, Junious Dresser, that Pt does not have a bed yet, as they don't hold beds.  She explained that she relayed this information to "Rita" yesterday.  Per Junious Dresser, the instructions were that, once Pt's Sodium stabilizes they will put Pt on their waiting list.  Junious Dresser asked that CSW fax Pt's most recent lab work.  Faxed labs to [facility].  Notified MD that Pt unable to d/c to [facility] today.  Providence Crosby, LCSWA Clinical Social Work [phone]

## 2011-03-11 NOTE — Discharge Summary (Addendum)
Patient ID: Mitchell [name] MRN: [id number] DOB/AGE: Jul 29, 1960 50 y.o.  Admit date: 03/10/2011 Discharge date: 03/11/2011  Primary Care Physician:  Provider Not In System  Discharge Diagnoses:    HYPONATREMIA SCHIZOAFFECTIVE DISORDER  Current Discharge Medication List    CONTINUE these medications which have NOT CHANGED   Details  amLODipine (NORVASC) 10 MG tablet Take 1 tablet (10 mg total) by mouth daily. Qty: 30 tablet, Refills: 1    aspirin 81 MG chewable tablet Chew 1 tablet (81 mg total) by mouth daily. Qty: 30 tablet, Refills: 1    benztropine (COGENTIN) 1 MG tablet Take 1 tablet (1 mg total) by mouth 2 (two) times daily. Qty: 60 tablet, Refills: 0    cloNIDine (CATAPRES) 0.1 MG tablet Take 1 tablet (0.1 mg total) by mouth 2 (two) times daily. Qty: 60 tablet, Refills: 1    glipiZIDE (GLUCOTROL XL) 5 MG 24 hr tablet Take 1 tablet (5 mg total) by mouth daily before breakfast. Qty: 30 tablet, Refills: 0    haloperidol (HALDOL) 10 MG tablet Take 1 tablet (10 mg total) by mouth at bedtime. Qty: 30 tablet, Refills: 0    haloperidol decanoate (HALDOL DECANOATE) 100 MG/ML injection Inject 1 mL (100 mg total) into the muscle every 30 (thirty) days. Qty: 1 mL, Refills: 0    insulin regular (HUMULIN R,NOVOLIN R) 100 units/mL injection Inject 2-5 Units into the skin 3 (three) times daily before meals. Dependant on blood sugar check.     lisinopril (PRINIVIL,ZESTRIL) 10 MG tablet Take 1 tablet (10 mg total) by mouth daily. Qty: 30 tablet, Refills: 1    metFORMIN (GLUCOPHAGE) 1000 MG tablet Take 1 tablet (1,000 mg total) by mouth 2 (two) times daily with a meal. Qty: 60 tablet, Refills: 0        Disposition and Follow-up: [facility] His labs and vitals were reviewed and are within normal limits. He was determined to be stable for discharge. Please note that he will need BMP checked in 1-2 weeks to check up on Sodium level. This was improving  during the hospitalization and also note that Carbamazepine was held due to Hyponatremia so that was not prescribed on discharge.  Consults:  psychiatry  Significant Diagnostic Studies:  No results found.  Brief H and P: This is a 50 year old male who [facility] for low [facility]. 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 Patient is sodium has improved from 117-121. Though his baseline sodium is around 125-130. Patient denies any blurred vision no history of passing out no chest pain or shortness of breath no nausea vomiting or diarrhea. Patient has a bipolar disorder and is also taking Tegretol 600 mg by mouth daily. Patient is a history of diverticulitis and takes glipizide.  Physical Exam on Discharge:  Filed Vitals:   03/10/11 0012 03/10/11 1542 03/10/11 2212 03/11/11 0347  BP: 131/83 151/84 134/87 131/81  Pulse: 84 92 87 78  Temp: 98.6 F (37 C) 98.2 F (36.8 C) 98.3 F (36.8 C) 97.8 F (36.6 C)  TempSrc: Oral Oral Oral Oral  Resp: 16 17 26 22   Height:  5\' 2"  (1.575 m)    SpO2: 96% 98% 98% 99%     Intake/Output Summary (Last 24 hours) at 03/11/11 1055 Last data filed at 03/11/11 0700  Gross per 24  hour  Intake 1817.5 ml  Output      1 ml  Net 1816.5 ml    General: Alert, awake, oriented to name and date, in no acute distress. HEENT: No bruits, no goiter. Heart: Regular rate and rhythm, without murmurs, rubs, gallops. Lungs: Clear to auscultation bilaterally. Abdomen: Soft, nontender, nondistended, positive bowel sounds. Extremities: No clubbing cyanosis or edema with positive pedal pulses. Neuro: Grossly intact, nonfocal.  CBC:    Component Value Date/Time   WBC 3.7* 03/11/2011 0344   HGB 12.3* 03/11/2011 0344   HCT 35.9* 03/11/2011 0344   PLT 162 03/11/2011 0344   MCV 86.3 03/11/2011  0344   NEUTROABS 3.2 03/05/2011 1955   LYMPHSABS 1.2 03/05/2011 1955   MONOABS 0.5 03/05/2011 1955   EOSABS 0.2 03/05/2011 1955   BASOSABS 0.0 03/05/2011 1955    Basic Metabolic Panel:    Component Value Date/Time   NA 122* 03/11/2011 0344   K 4.8 03/11/2011 0344   CL 89* 03/11/2011 0344   CO2 27 03/11/2011 0344   BUN 16 03/11/2011 0344   CREATININE 0.88 03/11/2011 0344   GLUCOSE 99 03/11/2011 0344   CALCIUM 9.2 03/11/2011 0344    Hospital Course:  Hyponatremia  May be secondary to Carbamazepine.  Fluid restriction did not seem to improved sodium level  PT is mentally stable with no focal neurologic findings  Na is now improving, 117 --> 122 Will need to have BMP checked once discharged in ~1-2 weeks Carbamazepine was d./c upon discharge  Diabetes mellitus  Glucotrol and metformin.  Good control during the hospitalization  Hypertension  Continue Clonidine and Lisinopril, amlodipine.  BP was well controlled during the hospitalization.  Disposition - plan of care and diagnosis, diagnostic studies and test results were discussed with pt - pt verbalized understanding - he will [facility]  Time spent on Discharge: Over 30 minutes  Signed: Debbora Presto 03/11/2011, 10:55 AM  Pg# [phone] Patient was initially discharged on December 13 but facility did not accept the patient secondary to low sodium. His hyponatremia has corrected and today his sodium level is 134. He will be discharged on sodium tablets twice daily and should get repeat labs in 2-3 days.  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 No further acute events noted from the 13th to the 20th.

## 2011-03-11 NOTE — Progress Notes (Signed)
03/11/11 1830 patient refuses any further IV fluids, states "it is making me sick and I do not want anymore" IV NSL physician notified.

## 2011-03-11 NOTE — Progress Notes (Signed)
Spoke with [place] at [facility].  Per Arville Lime, Pt to go to top of waiting list at [facility] once Sodium is stable.    Carlyon Shadow, Environmental education officer at [employer] [phone]).  Per Junious Dresser, Pt to move to top of waiting list once MD reviews labs that CSW faxed and oks Pt medically.    Notified MD  Csw to continue to follow.  Providence Crosby, LCSWA Clinical Social Work [phone]

## 2011-03-11 NOTE — Progress Notes (Signed)
Spoke with Rita's coverage at [facility], [place].  Per [place], [place] probably speaking with Arville Lime, Case Manager.  Contacted Marida.  LM  Providence Crosby, LCSWA Clinical Social Work [phone]

## 2011-03-12 LAB — GLUCOSE, CAPILLARY
Glucose-Capillary: 133 mg/dL — ABNORMAL HIGH (ref 70–99)
Glucose-Capillary: 239 mg/dL — ABNORMAL HIGH (ref 70–99)

## 2011-03-12 LAB — CBC
Hemoglobin: 14.4 g/dL (ref 13.0–17.0)
MCHC: 35.6 g/dL (ref 30.0–36.0)
RBC: 4.58 MIL/uL (ref 4.22–5.81)
WBC: 5.2 10*3/uL (ref 4.0–10.5)

## 2011-03-12 LAB — BASIC METABOLIC PANEL
CO2: 28 mEq/L (ref 19–32)
Chloride: 90 mEq/L — ABNORMAL LOW (ref 96–112)
GFR calc non Af Amer: 80 mL/min — ABNORMAL LOW (ref >90)
Glucose, Bld: 187 mg/dL — ABNORMAL HIGH (ref 70–99)
Potassium: 4.7 mEq/L (ref 3.5–5.1)
Sodium: 127 mEq/L — ABNORMAL LOW (ref 135–145)

## 2011-03-12 NOTE — Progress Notes (Signed)
Confirmed with facility receipt of fax.  Per Junious Dresser, MD requesting documentation re: Pt's baseline Sodium levels.  Contacted MD, Dr. Izola Price, regarding State's request.  MD able to access older records with Pt's Sodium levels.  MD off-campus now and will contact CSW when she returns.  Providence Crosby, LCSWA Clinical Social Work [phone]

## 2011-03-12 NOTE — Progress Notes (Signed)
Patient ID: Mitchell [name], male   DOB: 12/20/1960, 50 y.o.   MRN: [id number] Subjective: No events overnight. Patient denies chest pain, shortness of breath, abdominal pain. Had bowel movement and reports ambulating.  Objective:  Vital signs in last 24 hours:  Filed Vitals:   03/11/11 1430 03/11/11 2007 03/12/11 0550 03/12/11 1407  BP: 147/87 156/94 131/85 104/68  Pulse: 87 96 79 80  Temp: 98 F (36.7 C) 97.9 F (36.6 C) 97.8 F (36.6 C) 98.1 F (36.7 C)  TempSrc: Oral Oral Oral Oral  Resp: 18 20 16 16   Height:      SpO2: 100% 99% 100% 98%    Intake/Output from previous day:   Intake/Output Summary (Last 24 hours) at 03/12/11 1726 Last data filed at 03/12/11 1100  Gross per 24 hour  Intake   1860 ml  Output      0 ml  Net   1860 ml    Physical Exam: General: Alert, awake, oriented x3, in no acute distress. HEENT: No bruits, no goiter. Moist mucous membranes, no scleral icterus, no conjunctival pallor. Heart: Regular rate and rhythm, without murmurs, rubs, gallops. Lungs: Clear to auscultation bilaterally. No wheezing, no rhonchi, no rales.  Abdomen: Soft, nontender, nondistended, positive bowel sounds. Extremities: No clubbing cyanosis or edema,  positive pedal pulses. Neuro: Grossly intact, nonfocal.    Lab Results:  Basic Metabolic Panel:    Component Value Date/Time   NA 127* 03/12/2011 0310   K 4.7 03/12/2011 0310   CL 90* 03/12/2011 0310   CO2 28 03/12/2011 0310   BUN 20 03/12/2011 0310   CREATININE 1.07 03/12/2011 0310   GLUCOSE 187* 03/12/2011 0310   CALCIUM 9.7 03/12/2011 0310   CBC:    Component Value Date/Time   WBC 5.2 03/12/2011 0310   HGB 14.4 03/12/2011 0310   HCT 40.5 03/12/2011 0310   PLT 228 03/12/2011 0310   MCV 88.4 03/12/2011 0310   NEUTROABS 3.2 03/05/2011 1955   LYMPHSABS 1.2 03/05/2011 1955   MONOABS 0.5 03/05/2011 1955   EOSABS 0.2 03/05/2011 1955   BASOSABS 0.0 03/05/2011 1955      Lab 03/12/11 0310 03/11/11 0344 03/10/11  0950 03/09/11 1600 03/09/11 1012 03/05/11 1955  WBC 5.2 3.7* 3.3* 3.9* -- 5.1  HGB 14.4 12.3* 13.6 13.5 13.9 --  HCT 40.5 35.9* 37.7* 37.5* 41.0 --  PLT 228 162 153 157 -- 167  MCV 88.4 86.3 86.7 86.6 -- 88.1  MCH 31.4 29.6 31.3 31.2 -- 31.4  MCHC 35.6 34.3 36.1* 36.0 -- 35.6  RDW 12.6 12.3 12.2 12.2 -- 12.3  LYMPHSABS -- -- -- -- -- 1.2  MONOABS -- -- -- -- -- 0.5  EOSABS -- -- -- -- -- 0.2  BASOSABS -- -- -- -- -- 0.0  BANDABS -- -- -- -- -- --    Lab 03/12/11 0310 03/11/11 0344 03/10/11 0950 03/09/11 1600 03/09/11 1012 03/09/11 0325 03/08/11 2007 03/05/11 1955  NA 127* 122* 117* -- 121* 118* -- --  K 4.7 4.8 5.4* -- 4.6 4.4 -- --  CL 90* 89* 83* -- 85* 84* -- --  CO2 28 27 28  -- -- -- 30 30  GLUCOSE 187* 99 195* -- 164* 116* -- --  BUN 20 16 10  -- 11 13 -- --  CREATININE 1.07 0.88 0.92 0.96 0.80 -- -- --  CALCIUM 9.7 9.2 9.0 -- -- -- 9.2 9.1  MG -- -- -- -- -- -- -- --   No results found for  this basename: INR:5,PROTIME:5 in the last 168 hours Cardiac markers: No results found for this basename: CK:3,CKMB:3,TROPONINI:3,MYOGLOBIN:3 in the last 168 hours No components found with this basename: POCBNP:3 No results found for this or any previous visit (from the past 240 hour(s)).  Studies/Results: No results found.  Medications: Scheduled Meds:   . amLODipine  10 mg Oral Daily  . aspirin  81 mg Oral Once  . benztropine  1 mg Oral BID  . clonazePAM  1 mg Oral BID  . cloNIDine  0.1 mg Oral BID  . enoxaparin  40 mg Subcutaneous Q24H  . glipiZIDE  5 mg Oral Q breakfast  . haloperidol  10 mg Oral BID  . lisinopril  10 mg Oral Daily  . metFORMIN  1,000 mg Oral BID WC  . senna  1 tablet Oral BID  . sodium chloride  1 g Oral TID WC  . DISCONTD: insulin regular  2-5 Units Subcutaneous TID AC  . DISCONTD: metFORMIN  1,000 mg Oral BID   Continuous Infusions:   . DISCONTD: sodium chloride 75 mL/hr at 03/11/11 0504   PRN Meds:.acetaminophen, ondansetron (ZOFRAN) IV,  ondansetron  Assessment/Plan:  Hospital Course:  Hyponatremia  May be secondary to Carbamazepine.  Fluid restriction did not seem to improved sodium level  PT is mentally stable with no focal neurologic findings  Na is now improving, 117 --> 122 --> 127 Will need to have BMP checked once discharged in ~1-2 weeks  Carbamazepine discontinued  Diabetes mellitus  Glucotrol and metformin.  Good control during the hospitalization   Hypertension  Continue Clonidine and Lisinopril, amlodipine.  BP was well controlled during the hospitalization.   Disposition  - plan of care and diagnosis, diagnostic studies and test results were discussed with pt  - pt verbalized understanding  - he [facility] when bed available  Time spent on Discharge:  Over 30 minutes    LOS: 2 days   MAGICK-MYERS, ISKRA 03/12/2011, 5:26 PM

## 2011-03-12 NOTE — Progress Notes (Signed)
Spoke with Junious Dresser at [place] [facility].  Requested updated med list, as well as labs.  Faxed aforementioned documents to [facility].  Providence Crosby, LCSWA Clinical Social Work [phone]

## 2011-03-13 DIAGNOSIS — E871 Hypo-osmolality and hyponatremia: Secondary | ICD-10-CM

## 2011-03-13 LAB — BASIC METABOLIC PANEL
Chloride: 92 mEq/L — ABNORMAL LOW (ref 96–112)
Creatinine, Ser: 0.99 mg/dL (ref 0.50–1.35)
GFR calc Af Amer: >90 mL/min (ref >90)
Potassium: 4.6 mEq/L (ref 3.5–5.1)
Sodium: 127 mEq/L — ABNORMAL LOW (ref 135–145)

## 2011-03-13 LAB — CBC
Platelets: 196 10*3/uL (ref 150–400)
RBC: 4.15 MIL/uL — ABNORMAL LOW (ref 4.22–5.81)
RDW: 12.4 % (ref 11.5–15.5)
WBC: 4.4 10*3/uL (ref 4.0–10.5)

## 2011-03-13 LAB — GLUCOSE, CAPILLARY
Glucose-Capillary: 84 mg/dL (ref 70–99)
Glucose-Capillary: 100 mg/dL — ABNORMAL HIGH (ref 70–99)
Glucose-Capillary: 98 mg/dL (ref 70–99)

## 2011-03-13 NOTE — Progress Notes (Signed)
Subjective: Pt states that he feels good.  Is requesting to go home.  Denies any fever chills, abdominal discomfort, nausea.  Objective: Filed Vitals:   03/13/11 0520 03/13/11 1058 03/13/11 1059 03/13/11 1402  BP: 126/87 116/77 116/77 130/82  Pulse: 88   101  Temp: 97.2 F (36.2 C)   98.1 F (36.7 C)  TempSrc: Oral   Oral  Resp: 20   18  Height:      Weight:      SpO2: 95%   96%   Weight change:   Intake/Output Summary (Last 24 hours) at 03/13/11 1748 Last data filed at 03/13/11 1317  Gross per 24 hour  Intake    600 ml  Output      0 ml  Net    600 ml    General: Alert, awake, oriented x3, in no acute distress.  HEENT: No bruits, no goiter.  Heart: Regular rate and rhythm, without murmurs, rubs, gallops.  Lungs: CTA BL Abdomen: Soft, nontender, nondistended, positive bowel sounds.  Neuro: Grossly intact, nonfocal.   Lab Results:  [facility] 03/13/11 0524 03/12/11 0310  NA 127* 127*  K 4.6 4.7  CL 92* 90*  CO2 27 28  GLUCOSE 94 187*  BUN 18 20  CREATININE 0.99 1.07  CALCIUM 9.5 9.7  MG -- --  PHOS -- --   No results found for this basename: AST:2,ALT:2,ALKPHOS:2,BILITOT:2,PROT:2,ALBUMIN:2 in the last 72 hours No results found for this basename: LIPASE:2,AMYLASE:2 in the last 72 hours  Basename 03/13/11 0524 03/12/11 0310  WBC 4.4 5.2  NEUTROABS -- --  HGB 12.9* 14.4  HCT 36.3* 40.5  MCV 87.5 88.4  PLT 196 228   No results found for this basename: CKTOTAL:3,CKMB:3,CKMBINDEX:3,TROPONINI:3 in the last 72 hours No components found with this basename: POCBNP:3 No results found for this basename: DDIMER:2 in the last 72 hours No results found for this basename: HGBA1C:2 in the last 72 hours No results found for this basename: CHOL:2,HDL:2,LDLCALC:2,TRIG:2,CHOLHDL:2,LDLDIRECT:2 in the last 72 hours No results found for this basename: TSH,T4TOTAL,FREET3,T3FREE,THYROIDAB in the last 72 hours No results found for this basename:  VITAMINB12:2,FOLATE:2,FERRITIN:2,TIBC:2,IRON:2,RETICCTPCT:2 in the last 72 hours  Micro Results: No results found for this or any previous visit (from the past 240 hour(s)).  Studies/Results: No results found.  Medications: I have reviewed the patient's current medications.  Hyponatremia:  Suspect it is chronic in nature.  Pt is currently refusing IVF's.  Currently is taking sodium chloride tablets which have improved his hyponatremia from 122 to 127.  Will recheck BMP tomorrow and have recommended that patient continue IVF.  DM:  Will continue current regimen. Blood sugars well controlled currently  HTN: Currently stable and well controlled 130/82  Disposition:  Will have social worker find placement ideally tomorrow.  Patient Active Hospital Problem List: No active hospital problems.     LOS: 3 days   Penny Pia M.D.  [facility] 03/13/2011, 5:48 PM

## 2011-03-14 LAB — GLUCOSE, CAPILLARY: Glucose-Capillary: 138 mg/dL — ABNORMAL HIGH (ref 70–99)

## 2011-03-14 LAB — BASIC METABOLIC PANEL
BUN: 16 mg/dL (ref 6–23)
Calcium: 9.5 mg/dL (ref 8.4–10.5)
Chloride: 94 mEq/L — ABNORMAL LOW (ref 96–112)
Creatinine, Ser: 0.99 mg/dL (ref 0.50–1.35)
GFR calc Af Amer: >90 mL/min (ref >90)

## 2011-03-14 NOTE — Progress Notes (Signed)
Subjective: Pt feels better today.  No acute issues overnight. Objective: Filed Vitals:   03/14/11 0602 03/14/11 0924 03/14/11 0925 03/14/11 1356  BP: 104/69 115/75 115/75 118/72  Pulse: 84   72  Temp: 97.9 F (36.6 C)   98.8 F (37.1 C)  TempSrc: Oral   Oral  Resp: 18   18  Height:      Weight:      SpO2: 100%   98%   Weight change:   Intake/Output Summary (Last 24 hours) at 03/14/11 1654 Last data filed at 03/14/11 9604  Gross per 24 hour  Intake    600 ml  Output      0 ml  Net    600 ml    General: Alert, awake, oriented x3, in no acute distress.  HEENT: No bruits, no goiter.  Heart: Regular rate and rhythm, without murmurs, rubs, gallops.  Lungs: CTA BL Abdomen: Soft, nontender, nondistended, positive bowel sounds.  Neuro: Grossly intact, nonfocal.   Lab Results:  [facility] 03/14/11 0605 03/13/11 0524  NA 129* 127*  K 4.8 4.6  CL 94* 92*  CO2 27 27  GLUCOSE 93 94  BUN 16 18  CREATININE 0.99 0.99  CALCIUM 9.5 9.5  MG -- --  PHOS -- --   No results found for this basename: AST:2,ALT:2,ALKPHOS:2,BILITOT:2,PROT:2,ALBUMIN:2 in the last 72 hours No results found for this basename: LIPASE:2,AMYLASE:2 in the last 72 hours  Basename 03/13/11 0524 03/12/11 0310  WBC 4.4 5.2  NEUTROABS -- --  HGB 12.9* 14.4  HCT 36.3* 40.5  MCV 87.5 88.4  PLT 196 228   No results found for this basename: CKTOTAL:3,CKMB:3,CKMBINDEX:3,TROPONINI:3 in the last 72 hours No components found with this basename: POCBNP:3 No results found for this basename: DDIMER:2 in the last 72 hours No results found for this basename: HGBA1C:2 in the last 72 hours No results found for this basename: CHOL:2,HDL:2,LDLCALC:2,TRIG:2,CHOLHDL:2,LDLDIRECT:2 in the last 72 hours No results found for this basename: TSH,T4TOTAL,FREET3,T3FREE,THYROIDAB in the last 72 hours No results found for this basename: VITAMINB12:2,FOLATE:2,FERRITIN:2,TIBC:2,IRON:2,RETICCTPCT:2 in the last 72 hours  Micro  Results: No results found for this or any previous visit (from the past 240 hour(s)).  Studies/Results: No results found.  Medications: I have reviewed the patient's current medications.   Patient Active Hospital Problem List: Hyponatremia (03/13/2011)   Continually improving.  Will continue current drug regimen.  DM: Blood glucose well controlled.    Disposition:  Will speak to case worker tomorrow regarding placement.    LOS: 4 days   Penny Pia M.D.  [facility] 03/14/2011, 4:54 PM

## 2011-03-15 LAB — GLUCOSE, CAPILLARY
Glucose-Capillary: 100 mg/dL — ABNORMAL HIGH (ref 70–99)
Glucose-Capillary: 139 mg/dL — ABNORMAL HIGH (ref 70–99)
Glucose-Capillary: 76 mg/dL (ref 70–99)

## 2011-03-15 LAB — BASIC METABOLIC PANEL
CO2: 26 mEq/L (ref 19–32)
Calcium: 10 mg/dL (ref 8.4–10.5)
GFR calc non Af Amer: 78 mL/min — ABNORMAL LOW (ref >90)
Potassium: 4.5 mEq/L (ref 3.5–5.1)
Sodium: 130 mEq/L — ABNORMAL LOW (ref 135–145)

## 2011-03-15 MED ORDER — SODIUM CHLORIDE 0.9 % IV SOLN
INTRAVENOUS | Status: DC
  Administered 2011-03-15 – 2011-03-17 (×2): via INTRAVENOUS

## 2011-03-15 NOTE — Consult Note (Signed)
  Mitchell [name] is a 50 y.o. male [id number] 1960/07/24  Subjective/Objective:  Patient is calm cooperative good in interview pleasant on approach. But he still disorganized and guarded and his affect is still constricted. Patient's sodium level is 129. Patient denies hearing voices denies suicidal or homicidal ideations. No side effects reported from the medications.  Filed Vitals:   03/15/11 0539  BP: 126/87  Pulse: 88  Temp: 97.9 F (36.6 C)  Resp: 20    Lab Results:   BMET    Component Value Date/Time   NA 129* 03/14/2011 0605   K 4.8 03/14/2011 0605   CL 94* 03/14/2011 0605   CO2 27 03/14/2011 0605   GLUCOSE 93 03/14/2011 0605   BUN 16 03/14/2011 0605   CREATININE 0.99 03/14/2011 0605   CALCIUM 9.5 03/14/2011 0605   GFRNONAA >90 03/14/2011 0605   GFRAA >90 03/14/2011 4098    Medications:  Scheduled:     . amLODipine  10 mg Oral Daily  . aspirin  81 mg Oral Once  . benztropine  1 mg Oral BID  . clonazePAM  1 mg Oral BID  . cloNIDine  0.1 mg Oral BID  . enoxaparin  40 mg Subcutaneous Q24H  . glipiZIDE  5 mg Oral Q breakfast  . haloperidol  10 mg Oral BID  . lisinopril  10 mg Oral Daily  . metFORMIN  1,000 mg Oral BID WC  . senna  1 tablet Oral BID  . sodium chloride  1 g Oral TID WC     PRN Meds acetaminophen, ondansetron (ZOFRAN) IV, ondansetron  Assessment/Plan: We'll continue the current treatment plan.  Ronica Vivian S MD 03/15/2011

## 2011-03-15 NOTE — Progress Notes (Signed)
Spoke with Junious Dresser at [facility] re: Pt's Sodium Levels.  Notified her that I will be faxing a Flowsheet Data document on Pt that provides her with a visual of Pt's Sodium levels.  Faxed the Flowsheet Data document.  CSW to f/u with State.  Providence Crosby, LCSWA Clinical Social Work [phone]

## 2011-03-15 NOTE — Progress Notes (Signed)
Message from  [facility] stating that [facility] needs an repeat ChemA, progress notes, repeat Depakote levels and stated that Pt's Sodium levels have to be over 130 before they will accept Pt.  Notified CSW following Pt.  Providence Crosby, LCSWA Clinical Social Work [phone]

## 2011-03-15 NOTE — Progress Notes (Signed)
Subjective: Pt has no complaints today.  No acute issues overnight Objective: Filed Vitals:   03/14/11 2201 03/14/11 2226 03/15/11 0539 03/15/11 1458  BP: 138/86 138/86 126/87 111/76  Pulse: 74 74 88 85  Temp: 98 F (36.7 C) 98 F (36.7 C) 97.9 F (36.6 C) 97.9 F (36.6 C)  TempSrc: Oral Oral Oral Oral  Resp: 18 18 20 18   Height:      Weight:      SpO2: 96% 96% 93% 96%   Weight change:   Intake/Output Summary (Last 24 hours) at 03/15/11 1523 Last data filed at 03/15/11 1354  Gross per 24 hour  Intake   1680 ml  Output      1 ml  Net   1679 ml    General: Alert, awake, oriented x3, in no acute distress.  HEENT: No bruits, no goiter.  Heart: Regular rate and rhythm, without murmurs, rubs, gallops.  Lungs: CTA BL  Abdomen: Soft, nontender, nondistended, positive bowel sounds.  Neuro: Grossly intact, nonfocal.   Lab Results:  [facility] 03/15/11 1229 03/14/11 0605  NA 130* 129*  K 4.5 4.8  CL 96 94*  CO2 26 27  GLUCOSE 113* 93  BUN 17 16  CREATININE 1.09 0.99  CALCIUM 10.0 9.5  MG -- --  PHOS -- --   No results found for this basename: AST:2,ALT:2,ALKPHOS:2,BILITOT:2,PROT:2,ALBUMIN:2 in the last 72 hours No results found for this basename: LIPASE:2,AMYLASE:2 in the last 72 hours  Basename 03/13/11 0524  WBC 4.4  NEUTROABS --  HGB 12.9*  HCT 36.3*  MCV 87.5  PLT 196   No results found for this basename: CKTOTAL:3,CKMB:3,CKMBINDEX:3,TROPONINI:3 in the last 72 hours No components found with this basename: POCBNP:3 No results found for this basename: DDIMER:2 in the last 72 hours No results found for this basename: HGBA1C:2 in the last 72 hours No results found for this basename: CHOL:2,HDL:2,LDLCALC:2,TRIG:2,CHOLHDL:2,LDLDIRECT:2 in the last 72 hours No results found for this basename: TSH,T4TOTAL,FREET3,T3FREE,THYROIDAB in the last 72 hours No results found for this basename: VITAMINB12:2,FOLATE:2,FERRITIN:2,TIBC:2,IRON:2,RETICCTPCT:2 in the last 72  hours  Micro Results: No results found for this or any previous visit (from the past 240 hour(s)).  Studies/Results: No results found.  Medications: I have reviewed the patient's current medications.   Patient Active Hospital Problem List: Hyponatremia (03/13/2011)  -Will continue salt tablets obtain a cmp for tomorrow.  Pt is stable and asymptomatic -Start IVF's today  -Check a depakote level per request of accepting facility   LOS: 5 days   Penny Pia M.D.  [facility] 03/15/2011, 3:23 PM

## 2011-03-16 LAB — SODIUM
Sodium: 129 mEq/L — ABNORMAL LOW (ref 135–145)
Sodium: 129 mEq/L — ABNORMAL LOW (ref 135–145)

## 2011-03-16 LAB — COMPREHENSIVE METABOLIC PANEL
Albumin: 3.7 g/dL (ref 3.5–5.2)
BUN: 17 mg/dL (ref 6–23)
Creatinine, Ser: 0.96 mg/dL (ref 0.50–1.35)
Total Protein: 7.4 g/dL (ref 6.0–8.3)

## 2011-03-16 LAB — GLUCOSE, CAPILLARY: Glucose-Capillary: 84 mg/dL (ref 70–99)

## 2011-03-16 MED ORDER — SODIUM CHLORIDE 1 G PO TABS
2.0000 g | ORAL_TABLET | Freq: Three times a day (TID) | ORAL | Status: DC
  Administered 2011-03-16 – 2011-03-19 (×9): 2 g via ORAL
  Filled 2011-03-16 (×10): qty 2

## 2011-03-16 NOTE — Progress Notes (Signed)
Patient is very agitated this am at anticipation of being discharged.  He is pacing the halls, talking to himself, and fixating on repeating certain phrases about not going home.  MD on call notified, and would like his am dose of haldol to be given early. Also spoke with pharmacy about administering med early.  Will give 10 mg po haldol now and reassess agitation per Delilah Shan.P.

## 2011-03-16 NOTE — Progress Notes (Signed)
Subjective: Pt mentions that he fells fine.  Denies any fever, chills, nausea, abd, discomfort.  Objective: Filed Vitals:   03/15/11 0539 03/15/11 1458 03/15/11 2101 03/16/11 0600  BP: 126/87 111/76 143/82 124/74  Pulse: 88 85 72 78  Temp: 97.9 F (36.6 C) 97.9 F (36.6 C) 97.5 F (36.4 C) 97.7 F (36.5 C)  TempSrc: Oral Oral Oral Oral  Resp: 20 18 18 20   Height:      Weight:      SpO2: 93% 96% 100% 97%   Weight change:   Intake/Output Summary (Last 24 hours) at 03/16/11 1646 Last data filed at 03/16/11 0830  Gross per 24 hour  Intake   2500 ml  Output      0 ml  Net   2500 ml    General: Alert, awake, in no acute distress.  HEENT: No bruits, no goiter.  Heart: Regular rate and rhythm, without murmurs, rubs, gallops.  Lungs: CTA BL.  Abdomen: Soft, nontender, nondistended, positive bowel sounds.  Neuro: Grossly intact, nonfocal.   Lab Results:  Basename 03/16/11 1000 03/16/11 0518 03/15/11 1229  NA 129* 130* --  K -- 4.4 4.5  CL -- 97 96  CO2 -- 26 26  GLUCOSE -- 141* 113*  BUN -- 17 17  CREATININE -- 0.96 1.09  CALCIUM -- 9.6 10.0  MG -- -- --  PHOS -- -- --    Basename 03/16/11 0518  AST 13  ALT 12  ALKPHOS 102  BILITOT 0.1*  PROT 7.4  ALBUMIN 3.7   No results found for this basename: LIPASE:2,AMYLASE:2 in the last 72 hours No results found for this basename: WBC:2,NEUTROABS:2,HGB:2,HCT:2,MCV:2,PLT:2 in the last 72 hours No results found for this basename: CKTOTAL:3,CKMB:3,CKMBINDEX:3,TROPONINI:3 in the last 72 hours No components found with this basename: POCBNP:3 No results found for this basename: DDIMER:2 in the last 72 hours No results found for this basename: HGBA1C:2 in the last 72 hours No results found for this basename: CHOL:2,HDL:2,LDLCALC:2,TRIG:2,CHOLHDL:2,LDLDIRECT:2 in the last 72 hours No results found for this basename: TSH,T4TOTAL,FREET3,T3FREE,THYROIDAB in the last 72 hours No results found for this basename:  VITAMINB12:2,FOLATE:2,FERRITIN:2,TIBC:2,IRON:2,RETICCTPCT:2 in the last 72 hours  Micro Results: No results found for this or any previous visit (from the past 240 hour(s)).  Studies/Results: No results found.  Medications: I have reviewed the patient's current medications.   Patient Active Hospital Problem List: Hyponatremia (03/13/2011) Today will increase patient's salt tablets to 2 g three times daily.  Also will check sodium levels frequently.  Patient has decided today to be compliant with IVF.    Disposition[facility]  [facility] until patient sodium level is above 130.  Patient's sodium had been increasing this last week until yesterday where it leveled off at 130.  So today I have increased the salt tablets dosage.  Will continue IVF's with normal saline.  Will continue to check sodium levels tonight and tomorrow.     LOS: 6 days   Penny Pia M.D.  [facility] 03/16/2011, 4:46 PM

## 2011-03-16 NOTE — Progress Notes (Signed)
Per RN, Pt's Sodium was at 130 at 0500,129 at 1000.  Paged MD asking for another Sodium level.  CSW faxed repeat ChemA and repeat Depakote levels.  Providence Crosby, LCSWA Clinical Social Work [phone]

## 2011-03-16 NOTE — Progress Notes (Signed)
CSW paged MD again re:Sodium levels.  Spoke with MD who will order another level check.   Spoke with Pt and explained that his Sodium levels were low again and that the MD is going to re-check them.  Pt stated that he felt that if he continued to eat a fruit salad with cottage cheese and orange juice and milk that his levels would go up.  Pt knew the name and location of State and expressed a desire to go there.  CSW to continue to follow.  Providence Crosby, LCSWA Clinical Social Work [phone]

## 2011-03-16 NOTE — Progress Notes (Signed)
Pt feeling sad tonight about the recent children being killed on the news, c/o taxes being raised by his "son", as well as how upset he is about not knowing where he is going at the time of his d/c .  Spoke with patient about these things and assessed his emotional and physical needs. Switched tv to the voice, got him some snacks and diet coke and reassured him that the staff was here if he needed to talk to anyone or needed anything else. Pt in better spirits at this time, will continue to monitor.

## 2011-03-17 LAB — SODIUM: Sodium: 136 mEq/L (ref 135–145)

## 2011-03-17 MED ORDER — SODIUM CHLORIDE 1 G PO TABS: 1.0000 g | ORAL_TABLET | Freq: Two times a day (BID) | ORAL | Status: AC

## 2011-03-17 NOTE — Progress Notes (Signed)
Contacted Connie at [facility] to let her know that I'll be faxing updated labs and progress notes on Pt.  Notified Aram Beecham on 5th floor.  Call from [facility] stating that she received the documents and that Pt was being placed back on the waiting list.  She explained that her priority is the ER, which is approx 9 Pts at this time.  Junious Dresser was unable to give me an idea as to when a bed would be available for Pt.    Notified Aram Beecham on 5th floor State's status.  Notified psych MD.  Per psych MD, transfer Pt to [facility].  Notified [facility] that psych MD wanting Pt to go to [facility].  [facility] to look at bed status.    Notified Pt about [facility] admission. Pt ok with this, however he expressed his concerns regarding [facility] staff giving him the wrong medications in the past.  Provided Pt with the opportunity to vent his concerns and frustrations regarding [facility], the government and men. Pt wanting to write a letter to his son, Susa Loffler, and requesting assistance.  Notified RN who stated that she'll attempt to assist Pt.  CSW to follow up.  Providence Crosby, LCSWA Clinical Social Work [phone]

## 2011-03-17 NOTE — Progress Notes (Addendum)
Per [facility], Pt not appropriate for their facility, as his low Sodium levels were due to water intoxication.   [facility] feels that he cannot be safely managed at their facility.  Relayed info to psych MD.  Psych to speak to Dr. Allena Katz, psych MD at [facility], and notify CSW how to proceed.  Providence Crosby, LCSWA Clinical Social Work [phone]  Social Worker needs to proceed with transferring the patient to [facility].   The patients case has been discussed with Nursing and it is the conclusion that he cannot be maintained on the [facility] unit due to there being no way to adequately monitor his water intake.  Each room has a bathroom and there are water fountains on the hall.  He also has access to unlimited fluids in the cafeteria.  To assure that the patient did not once again dilute his electrolytes, all water on the unit would need to be disconnected.  When the patient was hospitalized at [facility] previously, he also was transferred to the Medicine floor after drinking excessive water and diluting his sodium.  Have the patient return to the unit would result in a potentially dangerous situation for the patient.  [facility] referral is the best disposition for this patient.

## 2011-03-17 NOTE — Progress Notes (Signed)
Subjective: Pt has no complaints today.    Objective: Filed Vitals:   03/16/11 0600 03/16/11 2140 03/16/11 2143 03/17/11 1406  BP: 124/74 139/74 139/74 128/70  Pulse: 78  86 84  Temp: 97.7 F (36.5 C)  98.2 F (36.8 C) 97.8 F (36.6 C)  TempSrc: Oral  Oral Oral  Resp: 20  18 18   Height:      Weight:      SpO2: 97%  100% 100%   Weight change:   Intake/Output Summary (Last 24 hours) at 03/17/11 1905 Last data filed at 03/17/11 1300  Gross per 24 hour  Intake 5339.26 ml  Output      0 ml  Net 5339.26 ml    General: Alert, awake, oriented x3, in no acute distress.  HEENT: No bruits, no goiter.  Heart: Regular rate and rhythm, without murmurs, rubs, gallops.  Lungs: CTA BL  Abdomen: Soft, nontender, nondistended, positive bowel sounds.  Neuro: Grossly intact, nonfocal.   Lab Results:  Basename 03/17/11 0525 03/16/11 1800 03/16/11 0518 03/15/11 1229  NA 136 129* -- --  K -- -- 4.4 4.5  CL -- -- 97 96  CO2 -- -- 26 26  GLUCOSE -- -- 141* 113*  BUN -- -- 17 17  CREATININE -- -- 0.96 1.09  CALCIUM -- -- 9.6 10.0  MG -- -- -- --  PHOS -- -- -- --    Basename 03/16/11 0518  AST 13  ALT 12  ALKPHOS 102  BILITOT 0.1*  PROT 7.4  ALBUMIN 3.7   No results found for this basename: LIPASE:2,AMYLASE:2 in the last 72 hours No results found for this basename: WBC:2,NEUTROABS:2,HGB:2,HCT:2,MCV:2,PLT:2 in the last 72 hours No results found for this basename: CKTOTAL:3,CKMB:3,CKMBINDEX:3,TROPONINI:3 in the last 72 hours No components found with this basename: POCBNP:3 No results found for this basename: DDIMER:2 in the last 72 hours No results found for this basename: HGBA1C:2 in the last 72 hours No results found for this basename: CHOL:2,HDL:2,LDLCALC:2,TRIG:2,CHOLHDL:2,LDLDIRECT:2 in the last 72 hours No results found for this basename: TSH,T4TOTAL,FREET3,T3FREE,THYROIDAB in the last 72 hours No results found for this basename:  VITAMINB12:2,FOLATE:2,FERRITIN:2,TIBC:2,IRON:2,RETICCTPCT:2 in the last 72 hours  Micro Results: No results found for this or any previous visit (from the past 240 hour(s)).  Studies/Results: No results found.  Medications: I have reviewed the patient's current medications.   Patient Active Hospital Problem List: Hyponatremia (03/13/2011)  -resolved.  Patient awaiting bed at outside facility.    -C LOS: 7 days   Earlene Plater MD, Ladell Pier M.D.  [facility] 03/17/2011, 7:05 PM

## 2011-03-18 LAB — BASIC METABOLIC PANEL
BUN: 15 mg/dL (ref 6–23)
Chloride: 98 mEq/L (ref 96–112)
GFR calc Af Amer: >90 mL/min (ref >90)
GFR calc non Af Amer: 86 mL/min — ABNORMAL LOW (ref >90)
Potassium: 4.3 mEq/L (ref 3.5–5.1)
Sodium: 134 mEq/L — ABNORMAL LOW (ref 135–145)

## 2011-03-18 NOTE — Progress Notes (Signed)
Please see addendum that was made to discharge summary done on 03/11/2011. Patient's sodium has stabilized and he is stable for discharge to psych facility. No further events noted.

## 2011-03-18 NOTE — Progress Notes (Signed)
[facility]. LME auth obtained and IVC ppw complete. Sheriff to provide transport. Pt aware of d/c and appears cooperative. No other CSW needs reported or noted. CSW signing off.  Dellie Burns, MSW, LCSWA [phone] (psych cover)

## 2011-03-18 NOTE — Progress Notes (Signed)
Patient has been waiting on [facility] Department since 1300. Social work stated that she had placed the call to the sheriffs office. I called to follow up with the [facility] Department and they stated that they did not receive a call from the social worker and that they were already on the road to [place] and that they would not come to pick him until tomorrow.

## 2011-03-19 NOTE — Progress Notes (Signed)
[facility] and [facility] by the [facility]'s department. Pt VS were stable upon d/c and pt did not have any complaints at the time of d/c.

## 2011-04-04 NOTE — Discharge Summary (Signed)
Physician Discharge Summary  Patient ID: Dillian Feig MRN: [id number] DOB/AGE: 04/19/1960 51 y.o.  Admit date: 02/27/2011 Discharge date: 03/08/2012 (Transferred to the Medicine Service on this date.)  Admission Diagnoses:  Axis I:  Schizoaffective Disorder - Bipolar Type. Axis II:  None. Axis III: 1. Hyponatremia.   2. Hypertension.   3. Insulin Dependence Diabetes Mellitus. Axis IV: Limited Primary Support System.  Chronic Mental Illness.  Non-compliance with Medications. Axis V:  GAF at time of admission approximately 30.  GAF at time of transfer to the Medicine Service approximately 35.  Discharge Diagnoses:   Axis I:  Schizoaffective Disorder - Bipolar Type. Axis II:  None. Axis III: 1. Hyponatremia.   2. Hypertension.   3. Insulin Dependence Diabetes Mellitus. Axis IV: Limited Primary Support System.  Chronic Mental Illness.  Non-compliance with Medications. Axis V:  GAF at time of admission approximately 30.  GAF at time of transfer to the Medicine Service approximately 35.  Principal Problem:  *Schizoaffective disorder, bipolar type  Diagnosis:  Axis I: Schizoaffective Disorder - Bipolar Type.  The patient was seen today prior to transfer and reports the following:  Sleep: The patient again reports to sleeping well last night.  Appetite: Good.   Mild>(1-10) >Severe  Hopelessness (1-10): 0  Depression (1-10): 0  Anxiety (1-10): 0   Suicidal Ideation: The patient adamantly denies any suicidal ideations today.  Plan: No  Intent: No  Means: No   Homicidal Ideation: The patient adamantly denies any homicidal ideations today.  Plan: No  Intent: No.  Means: No  Eye Contact: Good.  General Appearance/Behavior: Neat and Casual  Motor Behavior: WNL today.  Speech: Mildly pressured.  Mental Status: Alert and Oriented x 3  Level of Consciousness: Alert  Mood: Euthymic.  Affect: Remains Mildly angry about not being allowed to discharge.  Anxiety: None reported or  noted.  Thought Process: Disorganized.  Thought Content: Grandiose Delusions continue but are improved.  Perception: Poor  Judgment: Poor.  Insight: Poor.  Cognition: Orientation time, place and person.  Sleep: Number of Hours: 4.25  Vital Signs:Blood pressure 141/87, pulse 92, temperature 98.4 F (36.9 C), temperature source Oral, resp. rate 20, height 5\' 2"  (1.575 m), weight 88.905 kg (196 lb).   Treatment Plan Summary:  1. Daily contact with patient to assess and evaluate symptoms and progress in treatment  2. Medication management  3. The patient will deny suicidal ideations or homicidal ideations for 48 hours prior to discharge and have a depression and anxiety rating of 3 or less. The patient will also deny any auditory or visual hallucinations or delusional thinking or display any manic or hypomanic behaviors.   Plan:  1. Will continue current medications.  2. Labs ordered for tonight including CMP and Serum Tegretol Level.  3. Referral to [facility] in progress.  4. Continue to monitor.  5. Case Manager attempting to located placement. 6. Patient transferred to the ED and later the Medicine Service for Hyponatremia.  Hospital Course: Dimitri Shakespeare is an 51 y.o. male with a long of a Schizoaffective Disorder.  The patient was just treated at [facility] in November by Dr. Rogers Blocker. At time of admission, the patient reported that he lived at [facility] a facility he claims he bought and owns. 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" by those who care for him. Pt denied SI, HI and SA at that time. Pt was cooperative and agreed with going to [facility] for  treatment. However, pt was not oriented to situation or self. Pt was accepted to [facility] per Dr. Rogers Blocker. Pt appeared hyper, pressured speech, loud but redirectable.  During his hospitalization, the patient was seen daily and his medications adjusted.  However his delusions were found to be resistant to  treatment.  The patient was also observed frequently drinking large amounts of water and despite efforts to limit his fluid intake and provide sodium supplementation his serum sodium level began to drop in a similar manner as his last admission.  On 03/09/2011 the patient's sodium level of 117 and he was transferred to the ED for evaluation and later transferred to the Medicine Service for further care.    The patients case was discussed with Nursing and it is the conclusion that he cannot be maintained on the [facility] unit due to there being no way to adequately monitor his water intake. Each room has a bathroom and there are water fountains on the hall. He also has access to unlimited fluids in the cafeteria. To assure that the patient did not once again dilute his electrolytes, all water on the unit would need to be disconnected.  When the patient was hospitalized at [facility] previously, he also was transferred to the Medicine floor after drinking excessive water and diluting his sodium.  Having the patient return to the unit would result in a potentially dangerous situation for the patient. [facility] referral is the best disposition for this patient should another hospitalization be needed in the future.   Discharged Condition: The patient denied any depression or anxiety as well as any suicidal or homicidal ideations.  He remained delusional as described above.  Consults: None.  Significant Diagnostic Studies: Low Serum Sodium.  Discharge Exam: Blood pressure 150/84, pulse 100, temperature 98.6 F (37 C), temperature source Oral, resp. rate 18, height 5\' 2"  (1.575 m), weight 88.905 kg (196 lb), SpO2 99.00%.  Disposition: The patient was transferred to the ED and later admitted to the Medicine Service for Hyponatremia.  Medication List  As of 04/04/2011  8:20 PM   ASK your doctor about these medications         amLODipine 10 MG tablet   Commonly known as: NORVASC   Take 1 tablet (10 mg total) by mouth daily.       aspirin 81 MG chewable tablet   Chew 1 tablet (81 mg total) by mouth daily.      benztropine 1 MG tablet   Commonly known as: COGENTIN   Take 1 tablet (1 mg total) by mouth 2 (two) times daily.      cloNIDine 0.1 MG tablet   Commonly known as: CATAPRES   Take 1 tablet (0.1 mg total) by mouth 2 (two) times daily.      glipiZIDE 5 MG 24 hr tablet   Commonly known as: GLUCOTROL XL   Take 1 tablet (5 mg total) by mouth daily before breakfast.      haloperidol 10 MG tablet   Commonly known as: HALDOL   Take 1 tablet (10 mg total) by mouth at bedtime.      haloperidol decanoate 100 MG/ML injection   Commonly known as: HALDOL DECANOATE   Inject 1 mL (100 mg total) into the muscle every 30 (thirty) days.      ibuprofen 400 MG tablet   Commonly known as: ADVIL,MOTRIN   Take 1 tablet (400 mg total) by mouth 3 (three) times daily.      insulin regular 100 units/mL injection  Commonly known as: NOVOLIN R,HUMULIN R      lisinopril 10 MG tablet   Commonly known as: PRINIVIL,ZESTRIL   Take 1 tablet (10 mg total) by mouth daily.      metFORMIN 1000 MG tablet   Commonly known as: GLUCOPHAGE   Take 1 tablet (1,000 mg total) by mouth 2 (two) times daily with a meal.           Follow-up Information    Follow up with Provider Not In System .        The patient was transferred to [facility] by the medicine service after his hyponatremia was stabilized.  Signed: Randy Readling 04/04/2011, 8:20 PM

## 2011-04-07 NOTE — Discharge Summary (Signed)
Identifying information: This is a 51 year old African American male this was a voluntary admission.  Date of admission: 01/25/2011 Date of discharge: 02/09/2011.  Discharge diagnoses: Axis I: Schizoaffective disorder, bipolar type. Axis II: No diagnosis Axis III: Hyponatremia, d twice a day for hypertension. Glucotrol XL 5 mg every morning. abetes mellitus type 2 poorly controlled. Axis IV: Homeless Axis V: 36 past year not known  Discharge medications: Amlodipine 10 mg daily. Aspirin 81 mg daily for heart health. Clonidine 0.1 mg bid for hypertension Glipizide XL 5 mg every morning for diabetes. Haldol decanoate 100 mg Q. 14 days, for psychosis. Metformin 1000 mg twice a day, for diabetes.  Course of hospitalization:  : Patient was admitted to behavioral health for paranoia and bipolar disorder, and started on medication to stabilize his symptoms.  Case management worked on securing housing options for him.  He presented as impulsive with pressured speech, anxious, but non aggressive.  Insight negligible and thought content grandiose and hyperreligious.    He was started on medications to stabilize his symptoms and made minimal improvement.  As his appetite improved, his blood sugars gradually increased.  He is very resistant to the idea of taking insulin but cooperated with oral agents.    He was sent to the emergency room for evaluation of progressive hyponatremia after registering a sodium level of 136 at baseline, followed by a level of 118 only one week later.    He was admitted to the medical unit for further stabilization.  ,

## 2011-11-03 ENCOUNTER — Other Ambulatory Visit: Payer: Self-pay | Admitting: Gastroenterology

## 2011-11-03 DIAGNOSIS — Z139 Encounter for screening, unspecified: Secondary | ICD-10-CM

## 2011-11-04 ENCOUNTER — Ambulatory Visit
Admission: RE | Admit: 2011-11-04 | Discharge: 2011-11-04 | Disposition: A | Discharge status: Home / Self Care | Payer: No Typology Code available for payment source | Source: Ambulatory Visit | Attending: Gastroenterology | Admitting: Gastroenterology

## 2011-11-04 ENCOUNTER — Other Ambulatory Visit: Payer: Self-pay | Admitting: Gastroenterology

## 2011-11-04 DIAGNOSIS — Z139 Encounter for screening, unspecified: Secondary | ICD-10-CM

## 2012-07-07 ENCOUNTER — Emergency Department (HOSPITAL_COMMUNITY)
Admission: EM | Admit: 2012-07-07 | Discharge: 2012-07-08 | Disposition: A | Discharge status: Home / Self Care | Payer: 59 | Attending: Emergency Medicine | Admitting: Emergency Medicine

## 2012-07-07 ENCOUNTER — Encounter (HOSPITAL_COMMUNITY): Payer: Self-pay | Admitting: Emergency Medicine

## 2012-07-07 DIAGNOSIS — Z8659 Personal history of other mental and behavioral disorders: Secondary | ICD-10-CM | POA: Insufficient documentation

## 2012-07-07 DIAGNOSIS — F259 Schizoaffective disorder, unspecified: Secondary | ICD-10-CM | POA: Insufficient documentation

## 2012-07-07 DIAGNOSIS — Z8639 Personal history of other endocrine, nutritional and metabolic disease: Secondary | ICD-10-CM | POA: Insufficient documentation

## 2012-07-07 DIAGNOSIS — Z79899 Other long term (current) drug therapy: Secondary | ICD-10-CM | POA: Insufficient documentation

## 2012-07-07 DIAGNOSIS — Z87891 Personal history of nicotine dependence: Secondary | ICD-10-CM | POA: Insufficient documentation

## 2012-07-07 DIAGNOSIS — E119 Type 2 diabetes mellitus without complications: Secondary | ICD-10-CM | POA: Insufficient documentation

## 2012-07-07 DIAGNOSIS — Z862 Personal history of diseases of the blood and blood-forming organs and certain disorders involving the immune mechanism: Secondary | ICD-10-CM | POA: Insufficient documentation

## 2012-07-07 DIAGNOSIS — Z7982 Long term (current) use of aspirin: Secondary | ICD-10-CM | POA: Insufficient documentation

## 2012-07-07 DIAGNOSIS — F319 Bipolar disorder, unspecified: Secondary | ICD-10-CM | POA: Insufficient documentation

## 2012-07-07 DIAGNOSIS — I10 Essential (primary) hypertension: Secondary | ICD-10-CM | POA: Insufficient documentation

## 2012-07-07 LAB — URINALYSIS, ROUTINE W REFLEX MICROSCOPIC
Bilirubin Urine: NEGATIVE
Leukocytes, UA: NEGATIVE
Nitrite: NEGATIVE
Specific Gravity, Urine: 1.012 (ref 1.005–1.030)
Urobilinogen, UA: 0.2 mg/dL (ref 0.0–1.0)
pH: 7 (ref 5.0–8.0)

## 2012-07-07 LAB — POCT I-STAT, CHEM 8
Glucose, Bld: 104 mg/dL — ABNORMAL HIGH (ref 70–99)
HCT: 41 % (ref 39.0–52.0)
Hemoglobin: 13.9 g/dL (ref 13.0–17.0)
Potassium: 3.7 mEq/L (ref 3.5–5.1)

## 2012-07-07 LAB — POCT I-STAT TROPONIN I: Troponin i, poc: 0.02 ng/mL (ref 0.00–0.08)

## 2012-07-07 NOTE — ED Provider Notes (Signed)
History     CSN: [id number]  Arrival date & time 07/07/12  1857   First MD Initiated Contact with Patient 07/07/12 2028      Chief Complaint  Patient presents with  . Hypertension    (Consider location/radiation/quality/duration/timing/severity/associated sxs/prior treatment) Patient is a 52 y.o. male presenting with hypertension. The history is provided by the patient and the EMS personnel. No language interpreter was used.  Hypertension    Mitchell [name] is a 52 year old male who arrives to the emergency Department via EMS as from his group home for apparent hypertension. Level V caveat due the patient's mental capacity. He is an extremely poor historian.  Patient is a very poor historian and is unsure what his reason for being here is.  He thinks that he came because of his high blood pressure but wasn't sure if it was his blood pressure or his blood sugar.  Patient denies any headaches, visual disturbances, weakness, difficulty with speech.  The patient denies any chest pain or shortness of breath.  He denies any fevers, chills, myalgias, arthralgias.  Patient denies any abdominal pain, nausea, vomiting, diarrhea.   Past Medical History  Diagnosis Date  . Hypertension   . Diabetes mellitus   . Mental disorder   . Bipolar affective disorder   . Schizoaffective disorder   . Hyponatremia     history of    History reviewed. No pertinent past surgical history.  History reviewed. No pertinent family history.  History  Substance Use Topics  . Smoking status: Former Smoker -- 5 years    Types: Cigarettes  . Smokeless tobacco: Never Used  . Alcohol Use: No      Review of Systems  Unable to perform ROS: Psychiatric disorder    Allergies  Review of patient's allergies indicates no known allergies.  Home Medications   Current Outpatient Rx  Name  Route  Sig  Dispense  Refill  . amLODipine (NORVASC) 10 MG tablet   Oral   Take 1 tablet (10 mg total) by mouth daily.  30 tablet   1   . aspirin EC 81 MG tablet   Oral   Take 81 mg by mouth daily.         . cloNIDine (CATAPRES) 0.1 MG tablet   Oral   Take 0.1 mg by mouth 2 (two) times daily.         Marland Kitchen glipiZIDE (GLUCOTROL XL) 10 MG 24 hr tablet   Oral   Take 10 mg by mouth daily.         . Liraglutide (VICTOZA) 18 MG/3ML SOLN injection   Subcutaneous   Inject 0.6 mg into the skin daily.         Marland Kitchen lisinopril (PRINIVIL,ZESTRIL) 5 MG tablet   Oral   Take 15 mg by mouth daily.         Marland Kitchen lithium 300 MG tablet   Oral   Take 300-600 mg by mouth 2 (two) times daily. Take 1 tablet in AM and 2 tablets in PM         . loratadine (CLARITIN) 10 MG tablet   Oral   Take 10 mg by mouth daily.         . metFORMIN (GLUCOPHAGE) 1000 MG tablet   Oral   Take 1 tablet (1,000 mg total) by mouth 2 (two) times daily with a meal.   60 tablet   0   . OLANZapine (ZYPREXA) 10 MG tablet   Oral   Take  10-30 mg by mouth 2 (two) times daily. Take 1 tablet in AM and 2 tablets in PM         . polyethylene glycol (MIRALAX / GLYCOLAX) packet   Oral   Take 17 g by mouth daily.         . Skin Protectants, Misc. (EUCERIN) cream   Topical   Apply 1 application topically daily. To tops of feet         . traZODone (DESYREL) 50 MG tablet   Oral   Take 50 mg by mouth at bedtime.           BP 153/99  Pulse 94  Temp(Src) 98.8 F (37.1 C) (Oral)  Resp 18  Ht 5\' 6"  (1.676 m)  SpO2 93%  Physical Exam  Nursing note and vitals reviewed. Constitutional: He is oriented to person, place, and time. He appears well-developed and well-nourished. No distress.  HENT:  Head: Normocephalic and atraumatic.  Eyes: Conjunctivae are normal. No scleral icterus.  Neck: Normal range of motion. Neck supple.  Cardiovascular: Normal rate, regular rhythm, normal heart sounds and intact distal pulses.   No peripheral edema.  Pulmonary/Chest: Effort normal and breath sounds normal. No respiratory distress. He has  no rales.  Abdominal: Soft. There is no tenderness.  Musculoskeletal: Normal range of motion. He exhibits no edema.  Neurological: He is alert and oriented to person, place, and time. No cranial nerve deficit. Coordination normal.  Skin: Skin is warm and dry. He is not diaphoretic.  Psychiatric: His behavior is normal.  Flat affect.  Slow mentation.    ED Course  Procedures (including critical care time)  Labs Reviewed  POCT I-STAT, CHEM 8 - Abnormal; Notable for the following:    Glucose, Bld 104 (*)    All other components within normal limits  URINALYSIS, ROUTINE W REFLEX MICROSCOPIC  POCT I-STAT TROPONIN I   No results found.   Date: 07/07/2012  Rate:   Rhythm: normal sinus rhythm  QRS Axis: normal  Intervals: normal  ST/T Wave abnormalities: non specific t wave abnormality Conduction Disutrbances: none  Narrative Interpretation:   Old EKG Reviewed:  Change form previous       1. Hypertension       MDM  Patient appears comfortable.  He denies any current chest pain or shortness of breath.  He has no neurologic complaints.  UA is pending.  Patient's glucose is mildly elevated.  His blood pressure is also mildly elevated but does not meet hypertensive urgency or emergency. Ordered an EKG.   11:08 PM Negative troponin. No signs of acute ischemia on EKG VSS stable no signs of hypertensive urgency/emergency . Patient should follow yp with pcp The patient appears reasonably screened and/or stabilized for discharge and I doubt any other medical condition or other [facility] requiring further screening, evaluation, or treatment in the ED at this time prior to discharge.        Arthor Captain, PA-C 07/07/12 2310

## 2012-07-07 NOTE — ED Notes (Signed)
PA at bedside.

## 2012-07-07 NOTE — ED Notes (Signed)
RN instructed pt that a urine sample is needed for analysis

## 2012-07-07 NOTE — ED Notes (Signed)
Pt arrived from group home through [facility] with complaint of hypertension. Pt was not given any medications to bring down blood pressure. Pt is alert and oriented. No signs of distress noted. Pt denies pain, and SOB. Pt states he came on his own free will. Pt states he is not having any complaints and is feeling fine.

## 2012-07-08 NOTE — ED Notes (Signed)
Spoke to Grant at [facility], and they are aware pt is coming.

## 2012-07-08 NOTE — ED Notes (Signed)
PTAR paged. 

## 2012-07-10 NOTE — ED Provider Notes (Signed)
Medical screening examination/treatment/procedure(s) were performed by non-physician practitioner and as supervising physician I was immediately available for consultation/collaboration.   Andrick Rust L Jacksyn Beeks, MD 07/10/12 1122 

## 2012-07-14 ENCOUNTER — Emergency Department (HOSPITAL_COMMUNITY)
Admission: EM | Admit: 2012-07-14 | Discharge: 2012-07-14 | Disposition: A | Discharge status: Other Acute Inpatient Hospital | Payer: Medicare Other | Source: Home / Self Care

## 2012-11-05 IMAGING — CR DG BE W/ CM - WO/W KUB
18 of 24 series · 18 of 24 positions shown · non-contrast
Comparison: Abdomen films of 04/02/2009

CLINICAL DATA: Incomplete colonoscopy

SINGLE COLUMN BARIUM ENEMA
TECHNIQUE: Initial scout AP supine abdominal image obtained to
insure adequate colon cleansing.  Barium was introduced into the
colon in a retrograde fashion and refluxed from the rectum to the
cecum. Spot images of the colon followed by overhead radiographs
were obtained.
Fluoroscopy time: Five point of minutes.

[run (1 of 17)]
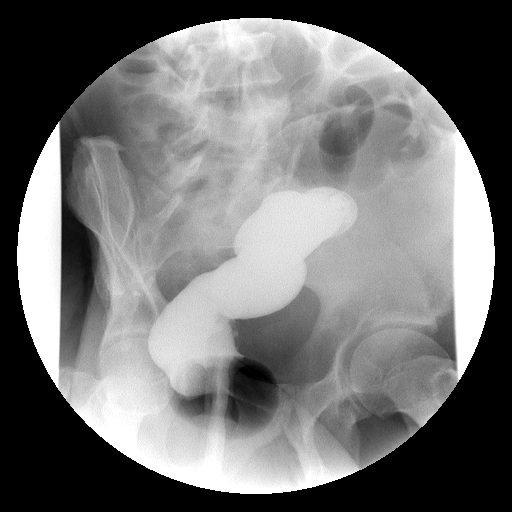

[run (2 of 17)]
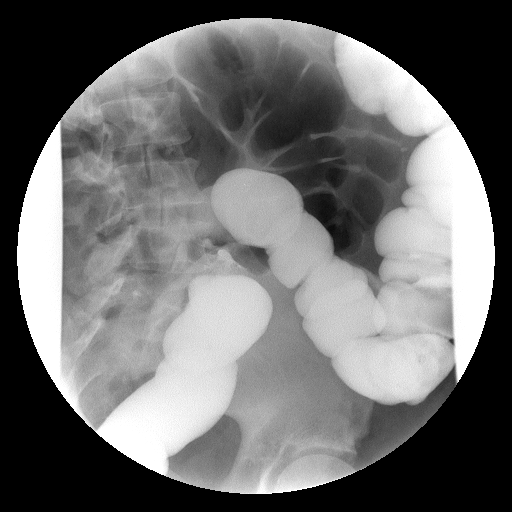

[run (3 of 17)]
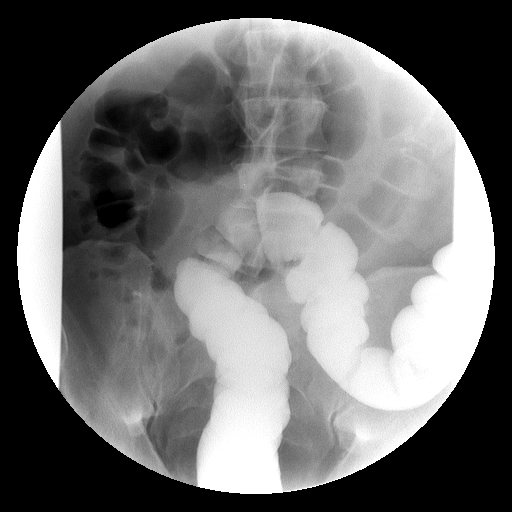

[run (4 of 17)]
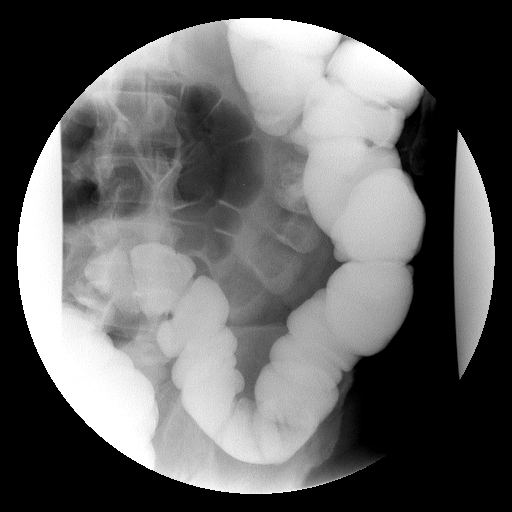

[run (5 of 17)]
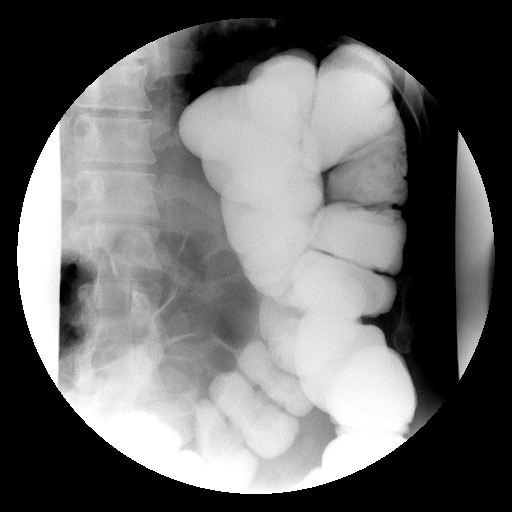

[run (6 of 17)]
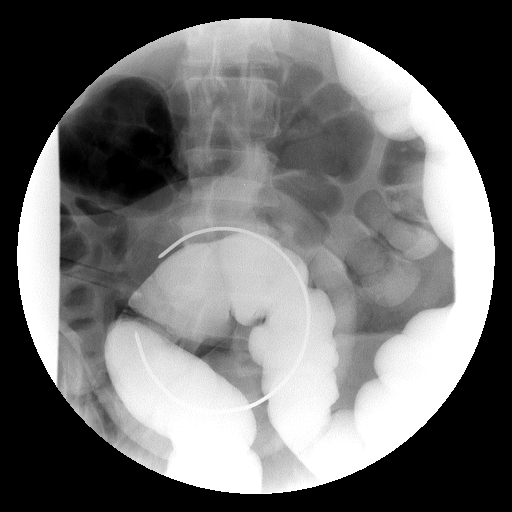

[run (7 of 17)]
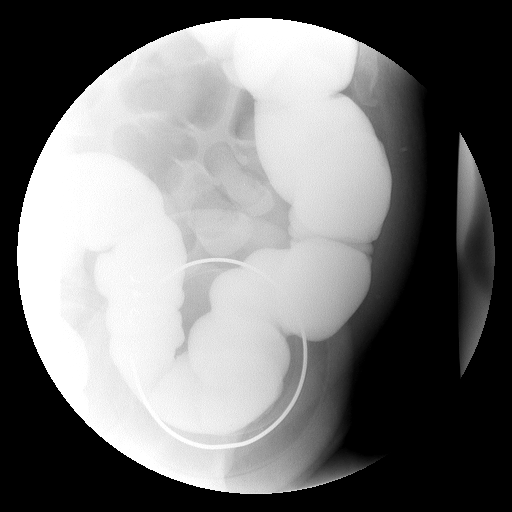

[run (8 of 17)]
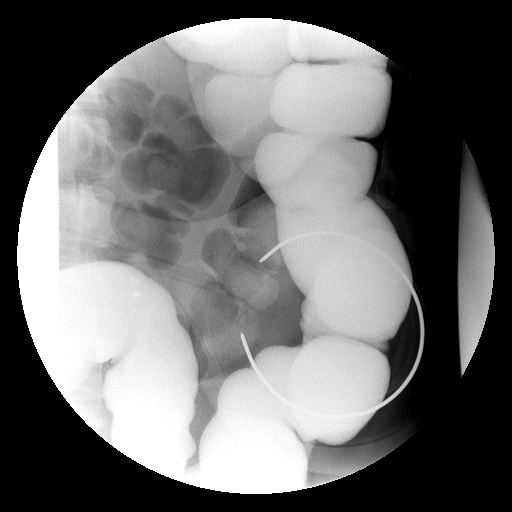

[run (9 of 17)]
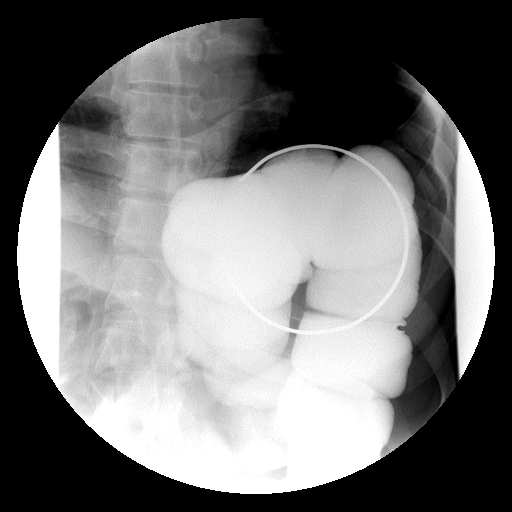

[run (10 of 17)]
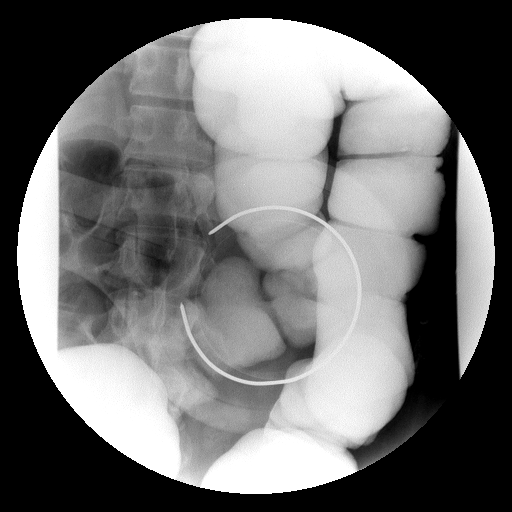

[run (11 of 17)]
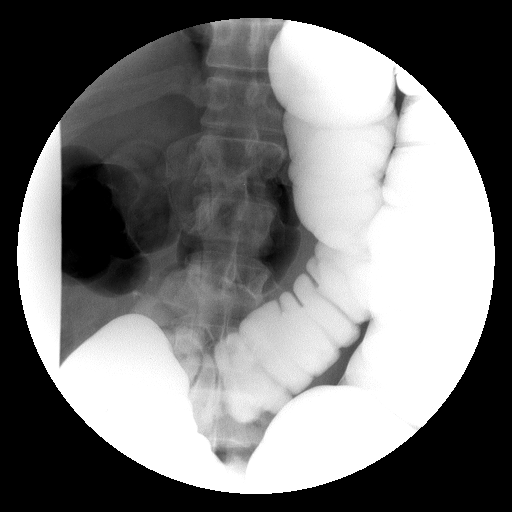

[run (12 of 17)]
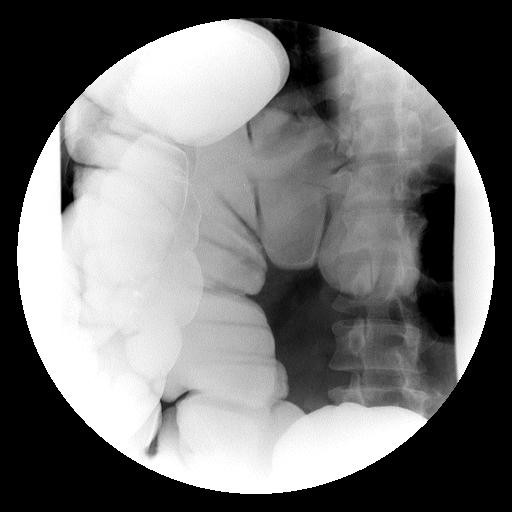

[run (13 of 17)]
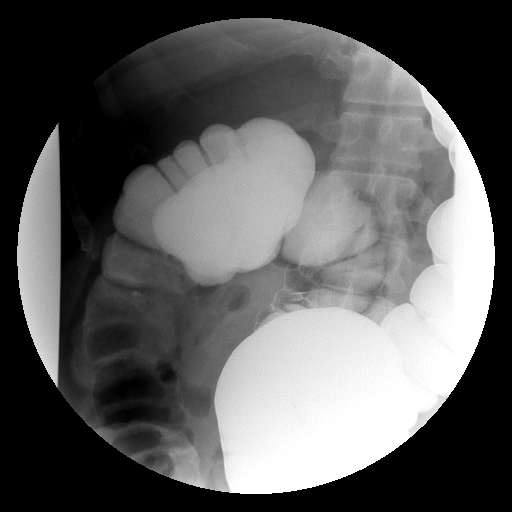

[run (14 of 17)]
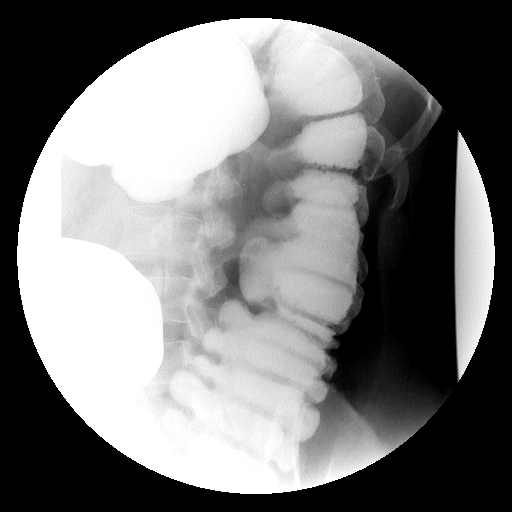

[run (15 of 17)]
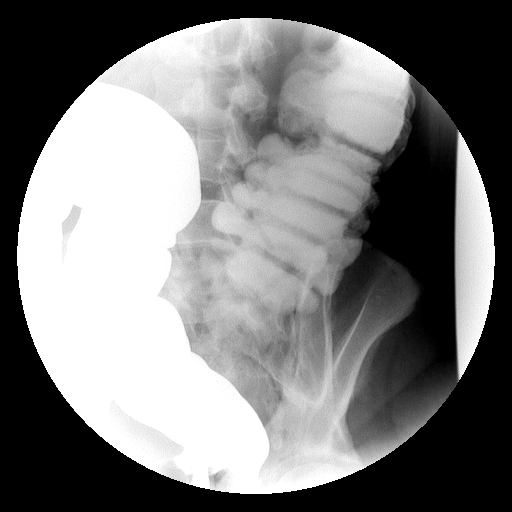

[run (16 of 17)]
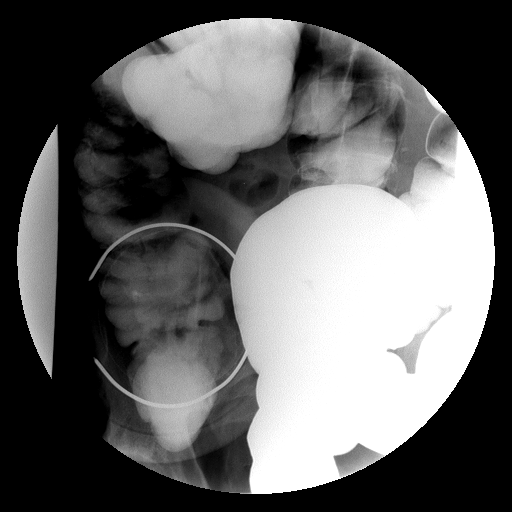

[run (17 of 17)]
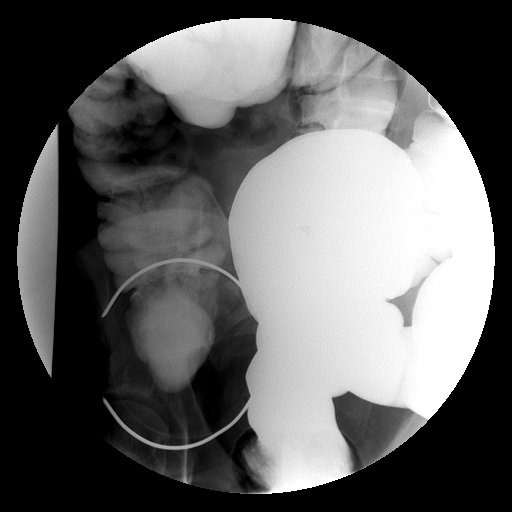

[view not recorded]
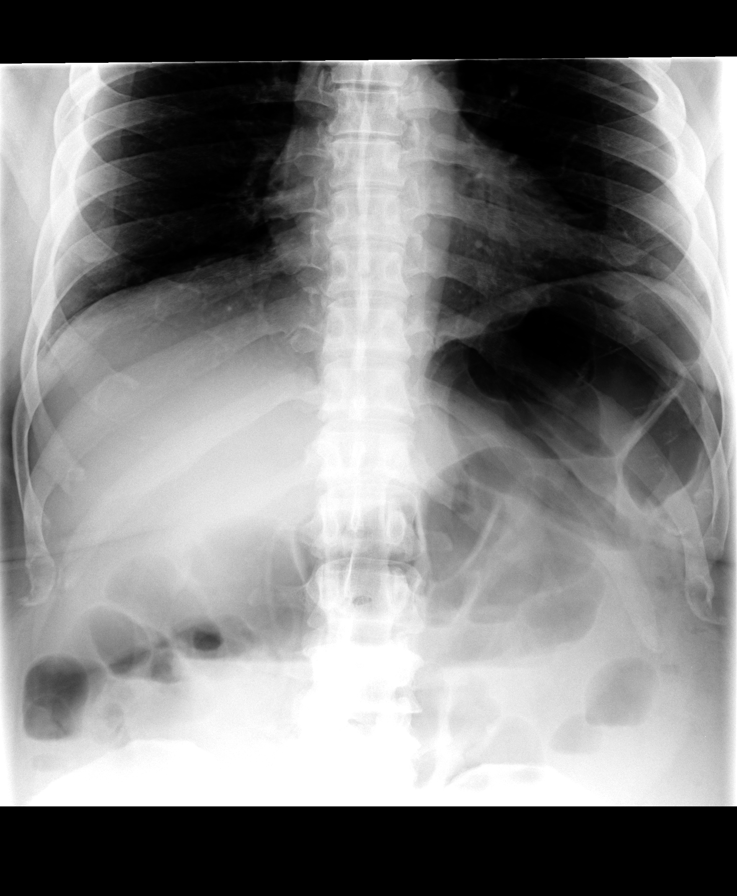

[18 of 24 positions shown; findings below may reference images not displayed]

FINDINGS: Preliminary films of the abdomen show a moderate amount
of retained air throughout the colon particularly in the right
colon.  No free air is seen.

A single contrast barium enema was performed.  Barium enters the
colon without evidence of obstruction or delay.  The colon is
somewhat tortuous.  As a result of the air in the right colon,
opacification of the right colon is suboptimal.  However no
persistent polypoid lesion or constricting lesion is seen.  On the
postevacuation film no abnormality is noted.
IMPRESSION: Tortuous colon.  No constricting lesion or persistent polypoid
lesion is seen.

## 2013-01-26 ENCOUNTER — Other Ambulatory Visit: Payer: Self-pay | Admitting: Ophthalmology

## 2013-01-26 NOTE — H&P (Signed)
Patient Record  Mitchell [name], Mitchell [name] Patient Number:  [id number] Date of Birth:  April 20, 1960 Age:  52 years old    Gender:  male Date of Evaluation:  January 26, 2013  Chief Complaint:   52 year old male patient for Cataract evaluation OU. he reportrs a long history of poor vision OU. He was diagnosed with Cataracts in 1995. He was "on the streets then". History of Present Illness:   52 yo male diabetic c/o blurred vision.  Hs difficulty seeing to read.  No pain or discomfort.  No pus o mucus.  Presents for diabetic eye exam. Past History:  Allergies:  None., Active Medications:   Other Medications:  Mucinex DM (dextromethorphan-guaifenesin) tablet extended release 12 hr 30-600 mg 1 tablet by mouth twice a day as directed tablet, eucerin cream Apply once a day as directed, metformin (metformin) tablet 1,000 mg Take 1 tablet by mouth twice a day as directed tablet, trazodone (trazodone) tablet 50 mg Take 1 tablet by mouth at bedtime as directed tablet, olanzapine (olanzapine) tablet 20 mg Take 1 tablet by mouth once a day as directed tablet, Glucotrol XL (glipizide) tablet extended release 24hr 10 mg Take 1 tablet by mouth once a day as directed tablet, Fora Tst Strips Use as directed once a day, Norvasc (amlodipine) tablet 10 mg Take 1 tablet by mouth every morning as directed tablet, lithium carbonate (lithium carbonate) tablet 300 mg Take 2 tablet by mouth twice a day as directed tablet, clonidine HCl (clonidine hcl) tablet 0.1 mg Take 1 tablet by mouth twice a day as directed tablet, Victoza 2-Pak (liraglutide) pen injector 0.6 mg/0.1 mL (18 mg/3 mL) Inject 6 mg subcutaneously once a day as directed mg, polyethylene glycol 3350 (polyethylene glycol 3350) powder 17 gram/dose Take 1 dissolved in water once a day as directed, 8 ounce, loratadine (loratadine) tablet 10 mg Take 1 tablet by mouth once a day as directed tablet, lisinopril (lisinopril) tablet 40 mg Take 1 tablet by mouth every morning as  directed tablet, aspirin (aspirin) tablet,delayed release (DR/EC) 81 mg Take 1 tablet by mouth every morning tablet Birth History:  none Past Ocular History:  none Past Medical History:  Diabetes Mellitus Type II Hypertension Past Surgical History:  none Family History:  no amblyopia, + blindness (mother), no cataracts, no crossed eyes, no diabetic retinopathy, no glaucoma, no macular degeneration, no retinal detachment, + cancer (cousin), + diabetes (mother), no heart disease, + high blood pressure (mother), no stroke Social History:   Smoking Status: never smoker Alcohol:  none   Driving status:  not driving  Marital status:  single Living arrangements:  other Review of Systems:   Constitutional:  no fever, no weight loss    Eyes: + blurred vision  Ear/Nose/Throat:  no hearing loss, no sinus problems    Cardiovascular:  no chest pain, no irregular heart beats    Respiratory:  no shortness of breath, no wheezing    Gastrointestinal:  no abdominal pain, no nausea    Genitourinary:  no blood in urine, no discomfort    Musculoskeletal:  no joint pain, no low back pain    Integumentary skin/breast:  no rashes, no skin tumors    Neurological:  no numbness, no weakness    Psychiatric:  no anxiety, no depression    Endocrine:  no heat intolerance, no thyroid problems    Hematologic/Lymphatic:  no anemia, no unusual bleeding    Allergic/Immunologic:  no hives, no seasonal allergies      All other systems are negative.  Examination:  Visual Acuity:   Distance VA Rupert:  OD: 20/200    OS: 20/200 IOP:  OD:  14     OS:  14    @ 01:02PM (Goldmann applanation) Manifest Refraction:    Sphere    Cyl Axis       VA         Add       VA Prism Base R:     Unable to refract OU due to PSC Cataracts                                                         L:                                                               comments: Poor reflexc ou  Confrontation visual field:  OU:  Normal  Motility:  OU:   Normal  Pupils:  OU:  Shape, size, direct and consensual reaction normal  Adnexa:  Preauricular LN, lacrimal drainage, lacrimal glands, orbit normal  Eyelids: Eyelids:  normal Conjunctiva: OU:  bulbar, palpebral normal  Cornea: OU:  epithelium, stroma, endothelium, tear film normal  Anterior Chamber: OU:  depth normal, no cell, no flare  Iris: OU:  normal Dilation:  OU: AK-Dilate, Tropicacyl @ 01:03PM  Lens:  OU:  2+ nuclear sclerosis, 3+ posterior subcapsular, 2+ cortical cataract  Vitreous: OU:  normal  Optic Disc: OU:  cupping: 0.25    Macula: OU:  normal  Vessels: OU:  normal  Periphery: OU:  normal A Scan / IOLMaster:  AScan predicts +20.00 Diopter Acrysof [id number] PC IOL for emmetropia OD  Orientation to person, place and time:  Normal  Mood and affect:  Normal Impression:  366.19  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 We discussed the related symptoms , visual significance and when we intervene with surgery. We discussed the surgical techniques used, risks and benefits of surgery.  He has significant Subcapsular Cataracts which obscure the fundus vise in both eys. I have recommended proceeding with Cataract surgery OD first.  He indicated understanding our discussion and felt that his questions had been answered to his satisfaction.  He indicates that he desires to have Cataract surgery to see better soon. Patient Instructions: Please do not eat anything after mignight the day before surgery. Return to clinic:  02/15/2013 4:30 PM for post-operative follow-up  Schedule:  Phacoemulsification, Posterior Chamber Intraocular Lens Right Eye x 02/14/2013   (electronically signed)  Khamarion Bjelland, MD   

## 2013-02-07 ENCOUNTER — Encounter (HOSPITAL_COMMUNITY)
Admission: RE | Admit: 2013-02-07 | Discharge: 2013-02-07 | Disposition: A | Discharge status: Home / Self Care | Payer: PRIVATE HEALTH INSURANCE | Source: Ambulatory Visit | Attending: Ophthalmology | Admitting: Ophthalmology

## 2013-02-07 ENCOUNTER — Ambulatory Visit (HOSPITAL_COMMUNITY)
Admission: RE | Admit: 2013-02-07 | Discharge: 2013-02-07 | Disposition: A | Discharge status: Home / Self Care | Payer: PRIVATE HEALTH INSURANCE | Source: Ambulatory Visit | Attending: Anesthesiology | Admitting: Anesthesiology

## 2013-02-07 ENCOUNTER — Encounter (HOSPITAL_COMMUNITY): Payer: Self-pay

## 2013-02-07 DIAGNOSIS — Z01810 Encounter for preprocedural cardiovascular examination: Secondary | ICD-10-CM | POA: Diagnosis not present

## 2013-02-07 DIAGNOSIS — Z87891 Personal history of nicotine dependence: Secondary | ICD-10-CM | POA: Insufficient documentation

## 2013-02-07 DIAGNOSIS — I10 Essential (primary) hypertension: Secondary | ICD-10-CM | POA: Insufficient documentation

## 2013-02-07 DIAGNOSIS — Z01818 Encounter for other preprocedural examination: Secondary | ICD-10-CM | POA: Diagnosis present

## 2013-02-07 DIAGNOSIS — E669 Obesity, unspecified: Secondary | ICD-10-CM | POA: Insufficient documentation

## 2013-02-07 DIAGNOSIS — G4733 Obstructive sleep apnea (adult) (pediatric): Secondary | ICD-10-CM | POA: Insufficient documentation

## 2013-02-07 HISTORY — DX: Sleep apnea, unspecified: G47.30

## 2013-02-07 LAB — BASIC METABOLIC PANEL
CO2: 27 mEq/L (ref 19–32)
Calcium: 10.2 mg/dL (ref 8.4–10.5)
GFR calc Af Amer: >90 mL/min (ref >90)
GFR calc non Af Amer: 80 mL/min — ABNORMAL LOW (ref >90)
Glucose, Bld: 77 mg/dL (ref 70–99)
Sodium: 140 mEq/L (ref 135–145)

## 2013-02-07 LAB — CBC
MCH: 32.1 pg (ref 26.0–34.0)
MCHC: 34.8 g/dL (ref 30.0–36.0)
MCV: 92.3 fL (ref 78.0–100.0)
Platelets: 235 10*3/uL (ref 150–400)
RBC: 4.52 MIL/uL (ref 4.22–5.81)
RDW: 13.4 % (ref 11.5–15.5)
WBC: 7.4 10*3/uL (ref 4.0–10.5)

## 2013-02-07 NOTE — Pre-Procedure Instructions (Signed)
Mitchell [name]  02/07/2013   Your procedure is scheduled on:  02-14-2013   Thursday   Report to [facility]  2 * 3 at 11:00 AM.   Call this number if you have problems the morning of surgery: [phone]   Remember:   Do not eat food or drink liquids after midnight.    Take these medicines the morning of surgery with A SIP OF WATER: amlodipine(Norvasc),clonidine(Catapress)lithum,loratadine(Claritin),Zyprexa    Do not wear jewelry, make-up or nail polish.  Do not wear lotions, powders, or perfumes.   Do not shave 48 hours prior to surgery. Men may shave face and neck.  Do not bring valuables to the hospital.  [facility] is not responsible   for any belongings or valuables.               Contacts, dentures or bridgework may not be worn into surgery.               Patients discharged the day of surgery will not be allowed to drive Home.    Name and phone number of your driver:    Special Instructions: Shower using CHG 2 nights before surgery and the night before surgery.  If you shower the day of surgery use CHG.  Use special wash - you have one bottle of CHG for all showers.  You should use approximately 1/3 of the bottle for each shower.   Please read over the following fact sheets that you were given: Pain Booklet and Surgical Site Infection Prevention

## 2013-02-07 NOTE — Progress Notes (Signed)
Pt. Lives in a group home : [facility].Staff member Genevie Cheshire [phone] states she will be here the day of surgery. States pt. Is his own guardian.

## 2013-02-08 MED ORDER — DIAZEPAM 5 MG PO TABS
ORAL_TABLET | ORAL | Status: AC
  Filled 2013-02-08: qty 2

## 2013-02-08 NOTE — Progress Notes (Signed)
Anesthesia Chart Anesthesia: Patient is a 52 year old male scheduled for right eye cataract extraction on 02/14/13 by Dr. Clarisa [facility].  History includes former smoker, obesity, HTN, DM2, Schizoaffective disorder, Bipolar disorder, OSA, hyponatremia.  PCP is listed as Dr. Fleet Contras.  EKG on 02/07/13 showed NSR.  Preoperative labs and CXR noted. Na was normal at 140.  Preoperative diagnostics appears acceptable for OR.  Anticipate that he can proceed as planned.  Velna Ochs [facility]/Anesthesiology Phone [phone] 02/08/2013 4:24 PM

## 2013-02-14 ENCOUNTER — Ambulatory Visit (HOSPITAL_COMMUNITY): Payer: PRIVATE HEALTH INSURANCE | Admitting: Anesthesiology

## 2013-02-14 ENCOUNTER — Encounter (HOSPITAL_COMMUNITY)
Admission: RE | Disposition: A | Discharge status: Home / Self Care | Payer: Self-pay | Source: Ambulatory Visit | Attending: Ophthalmology

## 2013-02-14 ENCOUNTER — Encounter (HOSPITAL_COMMUNITY): Payer: Self-pay | Admitting: *Deleted

## 2013-02-14 ENCOUNTER — Ambulatory Visit (HOSPITAL_COMMUNITY)
Admission: RE | Admit: 2013-02-14 | Discharge: 2013-02-14 | Disposition: A | Discharge status: Home / Self Care | Payer: PRIVATE HEALTH INSURANCE | Source: Ambulatory Visit | Attending: Ophthalmology | Admitting: Ophthalmology

## 2013-02-14 ENCOUNTER — Encounter (HOSPITAL_COMMUNITY): Payer: PRIVATE HEALTH INSURANCE | Admitting: Vascular Surgery

## 2013-02-14 DIAGNOSIS — H26019 Infantile and juvenile cortical, lamellar, or zonular cataract, unspecified eye: Secondary | ICD-10-CM | POA: Insufficient documentation

## 2013-02-14 DIAGNOSIS — H269 Unspecified cataract: Secondary | ICD-10-CM | POA: Insufficient documentation

## 2013-02-14 DIAGNOSIS — IMO0002 Reserved for concepts with insufficient information to code with codable children: Secondary | ICD-10-CM | POA: Insufficient documentation

## 2013-02-14 DIAGNOSIS — E119 Type 2 diabetes mellitus without complications: Secondary | ICD-10-CM | POA: Insufficient documentation

## 2013-02-14 DIAGNOSIS — I10 Essential (primary) hypertension: Secondary | ICD-10-CM | POA: Insufficient documentation

## 2013-02-14 HISTORY — PX: CATARACT EXTRACTION W/PHACO: SHX586

## 2013-02-14 LAB — GLUCOSE, CAPILLARY
Glucose-Capillary: 81 mg/dL (ref 70–99)
Glucose-Capillary: 77 mg/dL (ref 70–99)

## 2013-02-14 SURGERY — PHACOEMULSIFICATION, CATARACT, WITH IOL INSERTION
Anesthesia: Monitor Anesthesia Care | Site: Eye | Laterality: Right | Wound class: Clean

## 2013-02-14 MED ORDER — DEXAMETHASONE SODIUM PHOSPHATE 10 MG/ML IJ SOLN
INTRAMUSCULAR | Status: DC | PRN
  Administered 2013-02-14: 5 mg

## 2013-02-14 MED ORDER — PROPOFOL 10 MG/ML IV BOLUS: INTRAVENOUS | Status: DC | PRN

## 2013-02-14 MED ORDER — HYPROMELLOSE (GONIOSCOPIC) 2.5 % OP SOLN
OPHTHALMIC | Status: AC
  Filled 2013-02-14: qty 15

## 2013-02-14 MED ORDER — BACITRACIN-POLYMYXIN B 500-10000 UNIT/GM OP OINT
TOPICAL_OINTMENT | OPHTHALMIC | Status: DC | PRN
  Administered 2013-02-14: 1 via OPHTHALMIC

## 2013-02-14 MED ORDER — TETRACAINE HCL 0.5 % OP SOLN
2.0000 [drp] | OPHTHALMIC | Status: AC
  Administered 2013-02-14: 2 [drp] via OPHTHALMIC
  Filled 2013-02-14: qty 2

## 2013-02-14 MED ORDER — FENTANYL CITRATE 0.05 MG/ML IJ SOLN
INTRAMUSCULAR | Status: DC | PRN
  Administered 2013-02-14: 50 ug via INTRAVENOUS

## 2013-02-14 MED ORDER — FENTANYL CITRATE 0.05 MG/ML IJ SOLN: INTRAMUSCULAR | Status: DC | PRN

## 2013-02-14 MED ORDER — BUPIVACAINE HCL (PF) 0.75 % IJ SOLN
INTRAMUSCULAR | Status: AC
  Filled 2013-02-14: qty 10

## 2013-02-14 MED ORDER — DEXAMETHASONE SODIUM PHOSPHATE 10 MG/ML IJ SOLN
INTRAMUSCULAR | Status: AC
  Filled 2013-02-14: qty 1

## 2013-02-14 MED ORDER — NA CHONDROIT SULF-NA HYALURON 40-30 MG/ML IO SOLN
INTRAOCULAR | Status: AC
  Filled 2013-02-14: qty 0.5

## 2013-02-14 MED ORDER — BACITRACIN-POLYMYXIN B 500-10000 UNIT/GM OP OINT
TOPICAL_OINTMENT | OPHTHALMIC | Status: AC
  Filled 2013-02-14: qty 3.5

## 2013-02-14 MED ORDER — FENTANYL CITRATE 0.05 MG/ML IJ SOLN: 50.0000 ug | Freq: Once | INTRAMUSCULAR | Status: DC

## 2013-02-14 MED ORDER — PREDNISOLONE ACETATE 1 % OP SUSP
1.0000 [drp] | OPHTHALMIC | Status: AC
  Administered 2013-02-14: 1 [drp] via OPHTHALMIC
  Filled 2013-02-14: qty 5

## 2013-02-14 MED ORDER — LIDOCAINE HCL 2 % IJ SOLN
INTRAMUSCULAR | Status: DC | PRN
  Administered 2013-02-14: 13:00:00 via RETROBULBAR

## 2013-02-14 MED ORDER — PHENYLEPHRINE HCL 10 % OP SOLN
OPHTHALMIC | Status: AC
  Administered 2013-02-14: 1 [drp]
  Filled 2013-02-14: qty 5

## 2013-02-14 MED ORDER — NA CHONDROIT SULF-NA HYALURON 40-30 MG/ML IO SOLN
INTRAOCULAR | Status: DC | PRN
  Administered 2013-02-14: 0.5 mL via INTRAOCULAR

## 2013-02-14 MED ORDER — SODIUM HYALURONATE 10 MG/ML IO SOLN
INTRAOCULAR | Status: AC
  Filled 2013-02-14: qty 0.85

## 2013-02-14 MED ORDER — MIDAZOLAM HCL 2 MG/2ML IJ SOLN: 1.0000 mg | INTRAMUSCULAR | Status: DC | PRN

## 2013-02-14 MED ORDER — LIDOCAINE HCL 2 % IJ SOLN
INTRAMUSCULAR | Status: AC
  Filled 2013-02-14: qty 20

## 2013-02-14 MED ORDER — TETRACAINE HCL 0.5 % OP SOLN
OPHTHALMIC | Status: AC
  Filled 2013-02-14: qty 2

## 2013-02-14 MED ORDER — EPINEPHRINE HCL 1 MG/ML IJ SOLN
INTRAOCULAR | Status: DC | PRN
  Administered 2013-02-14: 13:00:00

## 2013-02-14 MED ORDER — HYPROMELLOSE (GONIOSCOPIC) 2.5 % OP SOLN
OPHTHALMIC | Status: DC | PRN
  Administered 2013-02-14: 2 [drp] via OPHTHALMIC

## 2013-02-14 MED ORDER — PHENYLEPHRINE HCL 2.5 % OP SOLN
1.0000 [drp] | OPHTHALMIC | Status: AC
  Administered 2013-02-14 (×3): 1 [drp] via OPHTHALMIC

## 2013-02-14 MED ORDER — MIDAZOLAM HCL 5 MG/5ML IJ SOLN: INTRAMUSCULAR | Status: DC | PRN

## 2013-02-14 MED ORDER — SODIUM CHLORIDE 0.9 % IV SOLN
INTRAVENOUS | Status: DC | PRN
  Administered 2013-02-14: 13:00:00 via INTRAVENOUS

## 2013-02-14 MED ORDER — MIDAZOLAM HCL 5 MG/5ML IJ SOLN
INTRAMUSCULAR | Status: DC | PRN
  Administered 2013-02-14: 2 mg via INTRAVENOUS

## 2013-02-14 MED ORDER — GATIFLOXACIN 0.5 % OP SOLN
1.0000 [drp] | OPHTHALMIC | Status: AC
  Administered 2013-02-14 (×3): 1 [drp] via OPHTHALMIC
  Filled 2013-02-14: qty 2.5

## 2013-02-14 MED ORDER — EPINEPHRINE HCL 1 MG/ML IJ SOLN
INTRAMUSCULAR | Status: AC
  Filled 2013-02-14: qty 1

## 2013-02-14 MED ORDER — PROVISC 10 MG/ML IO SOLN
INTRAOCULAR | Status: DC | PRN
  Administered 2013-02-14: 0.85 mL via INTRAOCULAR

## 2013-02-14 MED ORDER — SODIUM CHLORIDE 0.9 % IV SOLN
INTRAVENOUS | Status: DC
  Administered 2013-02-14: 12:00:00 via INTRAVENOUS

## 2013-02-14 MED ORDER — CEFAZOLIN SUBCONJUNCTIVAL INJECTION 100 MG/0.5 ML
100.0000 mg | INJECTION | SUBCONJUNCTIVAL | Status: DC
  Filled 2013-02-14: qty 0.5

## 2013-02-14 MED ORDER — BSS IO SOLN
INTRAOCULAR | Status: AC
  Filled 2013-02-14: qty 500

## 2013-02-14 MED ORDER — PROPOFOL 10 MG/ML IV BOLUS
INTRAVENOUS | Status: DC | PRN
  Administered 2013-02-14: 20 mg via INTRAVENOUS

## 2013-02-14 MED ORDER — CEFAZOLIN SUBCONJUNCTIVAL INJECTION 100 MG/0.5 ML
INJECTION | SUBCONJUNCTIVAL | Status: DC | PRN
  Administered 2013-02-14: 50 mg via SUBCONJUNCTIVAL

## 2013-02-14 SURGICAL SUPPLY — 50 items
APPLICATOR COTTON TIP 6IN STRL (MISCELLANEOUS) ×2 IMPLANT
APPLICATOR DR MATTHEWS STRL (MISCELLANEOUS) ×2 IMPLANT
BLADE KERATOME 2.75 (BLADE) ×2 IMPLANT
BLADE STAB KNIFE 15DEG (BLADE) IMPLANT
COVER MAYO STAND STRL (DRAPES) ×2 IMPLANT
DRAPE OPHTHALMIC 77X100 STRL (CUSTOM PROCEDURE TRAY) ×2 IMPLANT
DRAPE POUCH INSTRU U-SHP 10X18 (DRAPES) ×2 IMPLANT
DRSG TEGADERM 4X4.75 (GAUZE/BANDAGES/DRESSINGS) ×2 IMPLANT
FILTER BLUE MILLIPORE (MISCELLANEOUS) IMPLANT
GLOVE SS BIOGEL STRL SZ 6.5 (GLOVE) ×1 IMPLANT
GLOVE SUPERSENSE BIOGEL SZ 6.5 (GLOVE) ×1
GLOVE SURG SS PI 6.5 STRL IVOR (GLOVE) ×4 IMPLANT
GOWN SRG XL XLNG 56XLVL 4 (GOWN DISPOSABLE) ×1 IMPLANT
GOWN STRL NON-REIN LRG LVL3 (GOWN DISPOSABLE) ×2 IMPLANT
GOWN STRL NON-REIN XL XLG LVL4 (GOWN DISPOSABLE) ×1
KIT BASIN OR (CUSTOM PROCEDURE TRAY) ×2 IMPLANT
KIT ROOM TURNOVER OR (KITS) ×2 IMPLANT
KNIFE GRIESHABER SHARP 2.5MM (MISCELLANEOUS) ×2 IMPLANT
LENS INTRAOCULAR MA50BM 20.0 (Intraocular Lens) ×2 IMPLANT
MASK EYE SHIELD (GAUZE/BANDAGES/DRESSINGS) ×2 IMPLANT
NEEDLE 22X1 1/2 (OR ONLY) (NEEDLE) ×2 IMPLANT
NEEDLE 25GX 5/8IN NON SAFETY (NEEDLE) ×4 IMPLANT
NEEDLE FILTER BLUNT 18X 1/2SAF (NEEDLE) ×1
NEEDLE FILTER BLUNT 18X1 1/2 (NEEDLE) ×1 IMPLANT
NEEDLE HYPO 30X.5 LL (NEEDLE) ×4 IMPLANT
NS IRRIG 1000ML POUR BTL (IV SOLUTION) ×2 IMPLANT
PACK CATARACT CUSTOM (CUSTOM PROCEDURE TRAY) ×2 IMPLANT
PAD ARMBOARD 7.5X6 YLW CONV (MISCELLANEOUS) ×2 IMPLANT
PAD EYE OVAL STERILE LF (GAUZE/BANDAGES/DRESSINGS) ×2 IMPLANT
PAK PIK CVS CATARACT (OPHTHALMIC) ×2 IMPLANT
PROBE ANTERIOR 20G W/INFUS NDL (MISCELLANEOUS) IMPLANT
SHUTTLE MONARCH TYPE A (NEEDLE) ×2 IMPLANT
SPEAR EYE SURG WECK-CEL (MISCELLANEOUS) IMPLANT
SUT ETHILON 10-0 CS-B-6CS-B-6 (SUTURE)
SUT ETHILON 5 0 P 3 18 (SUTURE)
SUT ETHILON 9 0 TG140 8 (SUTURE) IMPLANT
SUT NYLON ETHILON 5-0 P-3 1X18 (SUTURE) IMPLANT
SUT PLAIN 6 0 TG1408 (SUTURE) IMPLANT
SUT POLY NON ABSORB 10-0 8 STR (SUTURE) IMPLANT
SUT VICRYL 6 0 S 29 12 (SUTURE) IMPLANT
SUTURE EHLN 10-0 CS-B-6CS-B-6 (SUTURE) IMPLANT
SYR 20CC LL (SYRINGE) IMPLANT
SYR TB 1ML LUER SLIP (SYRINGE) ×2 IMPLANT
SYRINGE 10CC LL (SYRINGE) IMPLANT
TAPE PAPER MEDFIX 1IN X 10YD (GAUZE/BANDAGES/DRESSINGS) ×2 IMPLANT
TIP ABS 45DEG FLARED 0.9MM (TIP) ×2 IMPLANT
TOWEL OR 17X24 6PK STRL BLUE (TOWEL DISPOSABLE) ×4 IMPLANT
WATER STERILE IRR 1000ML POUR (IV SOLUTION) ×2 IMPLANT
WIPE INSTRUMENT ADHESIVE BACK (MISCELLANEOUS) ×2 IMPLANT
WIPE INSTRUMENT VISIWIPE 73X73 (MISCELLANEOUS) ×2 IMPLANT

## 2013-02-14 NOTE — Preoperative (Signed)
Beta Blockers   Reason not to administer Beta Blockers:Not Applicable 

## 2013-02-14 NOTE — Op Note (Signed)
Mitchell [name] 02/14/2013 Cataract: Combined, Nuclear  Procedure: Phacoemulsification, Posterior Chamber Intra-ocular Lens Operative Eye:  right eye  Surgeon: Shade Flood Estimated Blood Loss: minimal Specimens for Pathology:  None Complications: none  The patient was prepared and draped in the usual manner for ocular surgery on the right eye. A Cook lid speculum was placed. A peripheral clear corneal incision was made at the surgical limbus centered at the 11:00 meridian. A separate clear corneal stab incision was made with a 15 degree blade at the 2:00 meridian to permit bi-manual technique. Viscoat and  Provisc as an underlying layer next to the capsule was instilled into the anterior chamber through that incision.  A keratome was used to create a self sealing incision entering the anterior chamber at the 11:00 meridian. A capsulorhexis was performed using a bent 25g needle. A radial tear at 4:30 occurred but did not interfere with completing the procedure safely. The lens was hydrodissected and the nucleus was hydrodilineated using a Nichammin cannula. The Chang chopper was inserted and used to rotate the lens to insure adequate lens mobility. The phacoemulsification handpiece was inserted and a combined phaco-chop technique was employed, fracturing the lens into separate sections with subsequent removal with the phaco handpiece.   The I/A cannula was used to remove remaining lens cortex. Provisc was instilled and used to deepen the anterior chamber and posterior capsule bag. The Monarch injector was used to place a folded Acrysof MA50BM PC IOL, + 20.00  diopters, into the capsule bag. A Sinskey lens hook was used to dial in the trailing haptic.  The I/A cannula was used to remove the viscoelastic from the anterior chamber. BSS was used to bring IOP to the desired range and the wound was checked to insure it was watertight. Subconjunctival injections of Ancef 100/0.4ml and Dexamethasone 0.5 ml of  a 10mg /83ml solution were placed without complication. The lid speculum and drapes were removed and the patient's eye was patched with Polymixin/Bacitracin ophthalmic ointment. An eye shield was placed and the patient was transferred alert and conversant from the operating room to the post-operative recovery area.   Shade Flood, MD

## 2013-02-14 NOTE — Transfer of Care (Signed)
Immediate Anesthesia Transfer of Care Note  Patient: Mitchell [name]  Procedure(s) Performed: Procedure(s): CATARACT EXTRACTION PHACO AND INTRAOCULAR LENS PLACEMENT (IOC) (Right)  Patient Location: PACU  Anesthesia Type:MAC  Level of Consciousness: awake, alert , oriented and sedated  Airway & Oxygen Therapy: Patient Spontanous Breathing  Post-op Assessment: Report given to PACU RN, Post -op Vital signs reviewed and stable and Patient moving all extremities  Post vital signs: Reviewed and stable  Complications: No apparent anesthesia complications

## 2013-02-14 NOTE — Anesthesia Postprocedure Evaluation (Signed)
  Anesthesia Post-op Note  Patient: Mitchell [name]  Procedure(s) Performed: Procedure(s): CATARACT EXTRACTION PHACO AND INTRAOCULAR LENS PLACEMENT (IOC) (Right)  Patient Location: PACU  Anesthesia Type:MAC  Level of Consciousness: awake  Airway and Oxygen Therapy: Patient Spontanous Breathing  Post-op Pain: mild  Post-op Assessment: Post-op Vital signs reviewed, Patient's Cardiovascular Status Stable, Respiratory Function Stable, Patent Airway, No signs of Nausea or vomiting and Pain level controlled  Post-op Vital Signs: Reviewed and stable  Complications: No apparent anesthesia complications

## 2013-02-14 NOTE — Anesthesia Preprocedure Evaluation (Signed)
Anesthesia Evaluation  Patient identified by MRN, date of birth, ID band Patient awake    Reviewed: Allergy & Precautions, H&P , NPO status , Patient's Chart, lab work & pertinent test results  Airway Mallampati: II TM Distance: <3 FB Neck ROM: Full    Dental   Pulmonary sleep apnea , former smoker,  breath sounds clear to auscultation        Cardiovascular hypertension, Rhythm:Regular Rate:Normal     Neuro/Psych    GI/Hepatic   Endo/Other  diabetes  Renal/GU      Musculoskeletal   Abdominal (+) + obese,   Peds  Hematology   Anesthesia Other Findings   Reproductive/Obstetrics                           Anesthesia Physical Anesthesia Plan  ASA: III  Anesthesia Plan: MAC   Post-op Pain Management:    Induction: Intravenous  Airway Management Planned: Nasal Cannula  Additional Equipment:   Intra-op Plan:   Post-operative Plan:   Informed Consent: I have reviewed the patients History and Physical, chart, labs and discussed the procedure including the risks, benefits and alternatives for the proposed anesthesia with the patient or authorized representative who has indicated his/her understanding and acceptance.     Plan Discussed with: Surgeon and CRNA  Anesthesia Plan Comments:         Anesthesia Quick Evaluation

## 2013-02-14 NOTE — H&P (View-Only) (Signed)
Patient Record  Mitchell [name], Mitchell [name] Patient Number:  [id number] Date of Birth:  [birth year] Age:  52 years old    Gender:  male Date of Evaluation:  January 26, 2013  Chief Complaint:   52 year old male patient for Cataract evaluation OU. he reportrs a long history of poor vision OU. He was diagnosed with Cataracts in 1995. He was "on the streets then". History of Present Illness:   52 yo male diabetic c/o blurred vision.  Hs difficulty seeing to read.  No pain or discomfort.  No pus o mucus.  Presents for diabetic eye exam. Past History:  Allergies:  None., Active Medications:   Other Medications:  Mucinex DM (dextromethorphan-guaifenesin) tablet extended release 12 hr 30-600 mg 1 tablet by mouth twice a day as directed tablet, eucerin cream Apply once a day as directed, metformin (metformin) tablet 1,000 mg Take 1 tablet by mouth twice a day as directed tablet, trazodone (trazodone) tablet 50 mg Take 1 tablet by mouth at bedtime as directed tablet, olanzapine (olanzapine) tablet 20 mg Take 1 tablet by mouth once a day as directed tablet, Glucotrol XL (glipizide) tablet extended release 24hr 10 mg Take 1 tablet by mouth once a day as directed tablet, Fora Tst Strips Use as directed once a day, Norvasc (amlodipine) tablet 10 mg Take 1 tablet by mouth every morning as directed tablet, lithium carbonate (lithium carbonate) tablet 300 mg Take 2 tablet by mouth twice a day as directed tablet, clonidine HCl (clonidine hcl) tablet 0.1 mg Take 1 tablet by mouth twice a day as directed tablet, Victoza 2-Pak (liraglutide) pen injector 0.6 mg/0.1 mL (18 mg/3 mL) Inject 6 mg subcutaneously once a day as directed mg, polyethylene glycol 3350 (polyethylene glycol 3350) powder 17 gram/dose Take 1 dissolved in water once a day as directed, 8 ounce, loratadine (loratadine) tablet 10 mg Take 1 tablet by mouth once a day as directed tablet, lisinopril (lisinopril) tablet 40 mg Take 1 tablet by mouth every morning as  directed tablet, aspirin (aspirin) tablet,delayed release (DR/EC) 81 mg Take 1 tablet by mouth every morning tablet Birth History:  none Past Ocular History:  none Past Medical History:  Diabetes Mellitus Type II Hypertension Past Surgical History:  none Family History:  no amblyopia, + blindness (mother), no cataracts, no crossed eyes, no diabetic retinopathy, no glaucoma, no macular degeneration, no retinal detachment, + cancer (cousin), + diabetes (mother), no heart disease, + high blood pressure (mother), no stroke Social History:   Smoking Status: never smoker Alcohol:  none   Driving status:  not driving  Marital status:  single Living arrangements:  other Review of Systems:   Constitutional:  no fever, no weight loss    Eyes: + blurred vision  Ear/Nose/Throat:  no hearing loss, no sinus problems    Cardiovascular:  no chest pain, no irregular heart beats    Respiratory:  no shortness of breath, no wheezing    Gastrointestinal:  no abdominal pain, no nausea    Genitourinary:  no blood in urine, no discomfort    Musculoskeletal:  no joint pain, no low back pain    Integumentary skin/breast:  no rashes, no skin tumors    Neurological:  no numbness, no weakness    Psychiatric:  no anxiety, no depression    Endocrine:  no heat intolerance, no thyroid problems    Hematologic/Lymphatic:  no anemia, no unusual bleeding    Allergic/Immunologic:  no hives, no seasonal allergies  All other systems are negative.  Examination:  Visual Acuity:   Distance [facility] [place]:  OD: 20/200    OS: 20/200 IOP:  OD:  14     OS:  14    @ 01:02PM (Goldmann applanation) Manifest Refraction:    Sphere    Cyl Axis       VA         Add       VA Prism Base R:     Unable to refract OU due to [facility] Cataracts                                                         L:                                                               comments: Poor reflexc ou  Confrontation visual field:  OU:  Normal  Motility:  OU:   Normal  Pupils:  OU:  Shape, size, direct and consensual reaction normal  Adnexa:  Preauricular LN, lacrimal drainage, lacrimal glands, orbit normal  Eyelids: Eyelids:  normal Conjunctiva: OU:  bulbar, palpebral normal  Cornea: OU:  epithelium, stroma, endothelium, tear film normal  Anterior Chamber: OU:  depth normal, no cell, no flare  Iris: OU:  normal Dilation:  OU: AK-Dilate, Tropicacyl @ 01:03PM  Lens:  OU:  2+ nuclear sclerosis, 3+ posterior subcapsular, 2+ cortical cataract  Vitreous: OU:  normal  Optic Disc: OU:  cupping: 0.25    Macula: OU:  normal  Vessels: OU:  normal  Periphery: OU:  normal A Scan / IOLMaster:  AScan predicts +20.00 Diopter Acrysof MA50BM PC IOL for emmetropia OD  Orientation to person, place and time:  Normal  Mood and affect:  Normal Impression:  366.19  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 We discussed the related symptoms , visual significance and when we intervene with surgery. We discussed the surgical techniques used, risks and benefits of surgery.  He has significant Subcapsular Cataracts which obscure the fundus vise in both eys. I have recommended proceeding with Cataract surgery OD first.  He indicated understanding our discussion and felt that his questions had been answered to his satisfaction.  He indicates that he desires to have Cataract surgery to see better soon. Patient Instructions: Please do not eat anything after mignight the day before surgery. Return to clinic:  02/15/2013 4:30 PM for post-operative follow-up  Schedule:  Phacoemulsification, Posterior Chamber Intraocular Lens Right Eye x 02/14/2013   (electronically signed)  Shade Flood, MD

## 2013-02-14 NOTE — Interval H&P Note (Signed)
History and Physical Interval Note:  02/14/2013 12:34 PM  BP 143/75  Pulse 73  Temp(Src) 97.7 F (36.5 C) (Oral)  Resp 20  SpO2 99%   Mitchell [name]  has presented today for surgery, with the diagnosis of Combined Cataract Right  The various methods of treatment have been discussed with the patient and family. After consideration of risks, benefits and other options for treatment, the patient has consented to  Procedure(s): CATARACT EXTRACTION PHACO AND INTRAOCULAR LENS PLACEMENT (IOC) (Right) as a surgical intervention .  The patient's history has been reviewed, patient examined, no change in status, stable for surgery.  I have reviewed the patient's chart and labs.  Questions were answered to the patient's satisfaction.  There are no contraindications for proceeding with planned surgery.     Maxcine Strong, Waynette Buttery

## 2013-02-16 ENCOUNTER — Encounter (HOSPITAL_COMMUNITY): Payer: Self-pay | Admitting: Ophthalmology

## 2013-05-02 ENCOUNTER — Other Ambulatory Visit: Payer: Self-pay | Admitting: Ophthalmology

## 2013-05-02 NOTE — H&P (Signed)
Patient Record  Ivie, Maese Patient Number:  [id number] Date of Birth:  Aug 29, 1960 Age:  53 years old    Gender:  male Date of Evaluation:  May 02, 2013  Chief Complaint:   Patient is seen back for Cataract evaluation OS. He is post Phaco IOL OD x 02/14/2013. He has no complaints OD . He has the same problem with inabilioty to see OS and is having problems with depth perception. His vision OS is obscurred severely. History of Present Illness:   53 yo male diabetichad cataracft surgery od 02/14/13 by Dr. Clarisa Kindred.  Patient says that he is seeing getter than before surgery.  Presents for post op evaluation. Past History:  Allergies:  None., Active Medications:   Other Medications:  Mucinex DM (dextromethorphan-guaifenesin) tablet extended release 12 hr 30-600 mg 1 tablet by mouth twice a day as directed tablet, eucerin cream Apply once a day as directed, metformin (metformin) tablet 1,000 mg Take 1 tablet by mouth twice a day as directed tablet, trazodone (trazodone) tablet 50 mg Take 1 tablet by mouth at bedtime as directed tablet, olanzapine (olanzapine) tablet 20 mg Take 1 tablet by mouth once a day as directed tablet, Glucotrol XL (glipizide) tablet extended release 24hr 10 mg Take 1 tablet by mouth once a day as directed tablet, Fora Tst Strips Use as directed once a day, Norvasc (amlodipine) tablet 10 mg Take 1 tablet by mouth every morning as directed tablet, lithium carbonate (lithium carbonate) tablet 300 mg Take 2 tablet by mouth twice a day as directed tablet, clonidine HCl (clonidine hcl) tablet 0.1 mg Take 1 tablet by mouth twice a day as directed tablet, Victoza 2-Pak (liraglutide) pen injector 0.6 mg/0.1 mL (18 mg/3 mL) Inject 6 mg subcutaneously once a day as directed mg, polyethylene glycol 3350 (polyethylene glycol 3350) powder 17 gram/dose Take 1 dissolved in water once a day as directed, 8 ounce, loratadine (loratadine) tablet 10 mg Take 1 tablet by mouth once a day as  directed tablet, lisinopril (lisinopril) tablet 40 mg Take 1 tablet by mouth every morning as directed tablet, aspirin (aspirin) tablet,delayed release (DR/EC) 81 mg Take 1 tablet by mouth every morning tablet Birth History:  none Past Ocular History:  none Past Medical History:   Diabetes Mellitus Type II Hypertension Past Surgical History:  none Family History:  no amblyopia, + blindness (mother), no cataracts, no crossed eyes, no diabetic retinopathy, no glaucoma, no macular degeneration, no retinal detachment, + cancer (cousin), + diabetes (mother), no heart disease, + high blood pressure (mother), no stroke Social History:   Smoking Status: never smoker  Alcohol:  none   Driving status:  not driving Marital status:  single  Living arrangements:  other Review of Systems:   Constitutional:  no fever, no weight loss    Eyes: + blurred vision  Ear/Nose/Throat:  no hearing loss, no sinus problems    Cardiovascular:  no chest pain, no irregular heart beats    Respiratory:  no shortness of breath, no wheezing    Gastrointestinal:  no abdominal pain, no nausea    Genitourinary:  no blood in urine, no discomfort    Musculoskeletal:  no joint pain, no low back pain    Integumentary skin/breast:  no rashes, no skin tumors    Neurological:  no numbness, no weakness    Psychiatric:  no anxiety, no depression    Endocrine:  no heat intolerance, no thyroid problems    Hematologic/Lymphatic:  no anemia, no  unusual bleeding    Allergic/Immunologic:  no hives, no seasonal allergies    All other systems are negative.  Examination:  Visual Acuity:   Distance [facility] [place]:  OD: 20/30-1    OS: 20/300 IOP:  OD:  15     OS:  17    @ 05:48PM (Goldmann applanation) Manifest Refraction:    Sphere    Cyl Axis       VA         Add       VA Prism Base R:  [place]                20/25                                  L: Unable                                                        comments: Unable to refract due  to dense PSC Cataract OS  Confrontation visual field:  OU:  Normal  Motility:  OU:  Normal  Pupils:  OU:  Shape, size, direct and consensual reaction normal  Adnexa:  Preauricular LN, lacrimal drainage, lacrimal glands, orbit normal  Eyelids:  Eyelids:  normal Conjunctiva:  OD:  Injected OS:  bulbar, palpebral normal  Cornea:  OD:  spk OS:  epithelium, stroma, endothelium, tear film normal  Anterior Chamber:  OD:  3+ deep, trace flare OU:  depth normal, no cell, no flare  Iris:  OU:  normal Dilation:  OU: AK-Dilate, Tropicacyl @ 01:03PM  Lens:  OD:  PC IOL in good position OS:  2+ nuclear sclerosis, 3+ posterior subcapsular, 2+ cortical cataract  Vitreous:  OU:  normal  Optic Disc:  OU:  cupping: 0.25  Macula:  OS:  hazy view OU:  normal  Vessels:  OS:  hazy view OU:  normal  Periphery:  OU:  normal                                   A Scan / IOLMaster:                                    AScan predicts +19.50 Diopter Acrysof MA50BM PC IOL for emmetropia OS Orientation to person, place and time:  Normal  Mood and affect:  Normal  Impression:  366.19  Combined Cataract OS: significant posterior subcapsular cataract OU v43.10  Pseudophakia OD -  -post Phaco IOL OD x 02/14/2013, plano 250.50  Diabetes, type II, with ophthalmic manifestations (not stated as controlled)  Plan/Treatment:  Cataract: We discussed the natural history of Cataracts with illustrations. We discussed the related symptoms , visual significance and when we intervene with surgery. We discussed the surgical techniques used, risks and benefits of surgery.  He has done well popst Phaco IOL OD and desires to proceed with Phaco IOL OS.   He indicated understanding our discussion and felt that his questions had been answered to his satisfaction.  He indicates that he desires to proceed with the recommended treatment/care plan.  Patient Instructions: Please do not  eat anything after mignight the day  before surgery. She may take morning medications with a sip of water. Return to clinic:  05/17/2013 at 4:00 PM for post-operative follow-up   (electronically signed)  Shade FloodGreer Brenn Gatton, MD

## 2013-05-10 ENCOUNTER — Encounter (HOSPITAL_COMMUNITY): Payer: Self-pay | Admitting: Pharmacy Technician

## 2013-05-10 NOTE — Pre-Procedure Instructions (Signed)
Pollie MeyerDavid Berres  05/10/2013   Your procedure is scheduled on:  Wed, Feb 18 @ 8:30 AM  [facility]  at 6:30 AM.  Call this number if you have problems the morning of surgery: [phone]   Remember:   Do not eat food or drink liquids after midnight.   Take these medicines the morning of surgery with A SIP OF WATER: Amlodipine(Norvasc),Catapres(Clonidine),Lithium,Claritin(Loratadine),and Zyprexa(Olanzapine)              Stop taking your Aspirin. No Goody's,BC's.Aleve,Ibuprofen,Fish Oil,or any Herbal Medications   Do not wear jewelry  Do not wear lotions, powders, or colognes. You may wear deodorant.  Men may shave face and neck.  Do not bring valuables to the hospital.  [facility] is not responsible                  for any belongings or valuables.               Contacts, dentures or bridgework may not be worn into surgery.  Leave suitcase in the car. After surgery it may be brought to your room.  For patients admitted to the hospital, discharge time is determined by your                treatment team.               Patients discharged the day of surgery will not be allowed to drive  home.    Special Instructions:   - Preparing for Surgery  Before surgery, you can play an important role.  Because skin is not sterile, your skin needs to be as free of germs as possible.  You can reduce the number of germs on you skin by washing with CHG (chlorahexidine gluconate) soap before surgery.  CHG is an antiseptic cleaner which kills germs and bonds with the skin to continue killing germs even after washing.  Please DO NOT use if you have an allergy to CHG or antibacterial soaps.  If your skin becomes reddened/irritated stop using the CHG and inform your nurse when you arrive at Short Stay.  Do not shave (including legs and underarms) for at least 48 hours prior to the first CHG shower.  You may shave your face.  Please follow these instructions  carefully:   1.  Shower with CHG Soap the night before surgery and the                                morning of Surgery.  2.  If you choose to wash your hair, wash your hair first as usual with your       normal shampoo.  3.  After you shampoo, rinse your hair and body thoroughly to remove the                      Shampoo.  4.  Use CHG as you would any other liquid soap.  You can apply chg directly       to the skin and wash gently with scrungie or a clean washcloth.  5.  Apply the CHG Soap to your body ONLY FROM THE NECK DOWN.        Do not use on open wounds or open sores.  Avoid contact with your eyes,       ears, mouth and genitals (private parts).  Wash genitals (  private parts)       with your normal soap.  6.  Wash thoroughly, paying special attention to the area where your surgery        will be performed.  7.  Thoroughly rinse your body with warm water from the neck down.  8.  DO NOT shower/wash with your normal soap after using and rinsing off       the CHG Soap.  9.  Pat yourself dry with a clean towel.            10.  Wear clean pajamas.            11.  Place clean sheets on your bed the night of your first shower and do not        sleep with pets.  Day of Surgery  Do not apply any lotions/deoderants the morning of surgery.  Please wear clean clothes to the hospital/surgery center.    Please read over the following fact sheets that you were given: Pain Booklet, Coughing and Deep Breathing and Surgical Site Infection Prevention

## 2013-05-11 ENCOUNTER — Encounter (HOSPITAL_COMMUNITY)
Admission: RE | Admit: 2013-05-11 | Discharge: 2013-05-11 | Disposition: A | Discharge status: Home / Self Care | Payer: PRIVATE HEALTH INSURANCE | Source: Ambulatory Visit | Attending: Ophthalmology | Admitting: Ophthalmology

## 2013-05-11 ENCOUNTER — Encounter (HOSPITAL_COMMUNITY): Payer: Self-pay

## 2013-05-11 DIAGNOSIS — Z01812 Encounter for preprocedural laboratory examination: Secondary | ICD-10-CM | POA: Insufficient documentation

## 2013-05-11 HISTORY — DX: Cerebral infarction, unspecified: I63.9

## 2013-05-11 HISTORY — DX: Other seasonal allergic rhinitis: J30.2

## 2013-05-11 LAB — CBC
HEMATOCRIT: 39.7 % (ref 39.0–52.0)
HEMOGLOBIN: 13.7 g/dL (ref 13.0–17.0)
MCH: 31.8 pg (ref 26.0–34.0)
MCHC: 34.5 g/dL (ref 30.0–36.0)
MCV: 92.1 fL (ref 78.0–100.0)
Platelets: 234 10*3/uL (ref 150–400)
RBC: 4.31 MIL/uL (ref 4.22–5.81)
RDW: 12.9 % (ref 11.5–15.5)
WBC: 6.5 10*3/uL (ref 4.0–10.5)

## 2013-05-11 LAB — BASIC METABOLIC PANEL
BUN: 10 mg/dL (ref 6–23)
CHLORIDE: 98 meq/L (ref 96–112)
CO2: 30 meq/L (ref 19–32)
Calcium: 9.6 mg/dL (ref 8.4–10.5)
Creatinine, Ser: 0.97 mg/dL (ref 0.50–1.35)
GFR calc Af Amer: >90 mL/min (ref >90)
GFR calc non Af Amer: >90 mL/min (ref >90)
Glucose, Bld: 257 mg/dL — ABNORMAL HIGH (ref 70–99)
POTASSIUM: 4.6 meq/L (ref 3.7–5.3)
Sodium: 137 mEq/L (ref 137–147)

## 2013-05-11 NOTE — Progress Notes (Addendum)
Pt doesn't have a cardiologist  Denies ever having an echo/stress test/heart cath   EKG and CXR in epic from 02-07-13   Medical Md is Dr. Fleet ContrasEdwin Avbuere

## 2013-05-15 MED ORDER — TETRACAINE HCL 0.5 % OP SOLN: 2.0000 [drp] | OPHTHALMIC | Status: DC

## 2013-05-15 MED ORDER — CYCLOPENTOLATE HCL 1 % OP SOLN: 1.0000 [drp] | OPHTHALMIC | Status: DC | PRN

## 2013-05-15 MED ORDER — PREDNISOLONE ACETATE 1 % OP SUSP: 1.0000 [drp] | OPHTHALMIC | Status: DC

## 2013-05-15 MED ORDER — GATIFLOXACIN 0.5 % OP SOLN: 1.0000 [drp] | OPHTHALMIC | Status: DC | PRN

## 2013-05-15 MED ORDER — PHENYLEPHRINE HCL 2.5 % OP SOLN: 1.0000 [drp] | OPHTHALMIC | Status: DC | PRN

## 2013-05-15 NOTE — Progress Notes (Signed)
Dr. Clarisa KindredGeiger notified that patient called today and stated that his transportation will not be able to bring him in. She will call OR scheduling to change the order of her patients. We will notify patient's of time changes.

## 2013-05-16 ENCOUNTER — Encounter (HOSPITAL_COMMUNITY): Admission: RE | Payer: Self-pay | Source: Ambulatory Visit

## 2013-05-16 ENCOUNTER — Ambulatory Visit (HOSPITAL_COMMUNITY)
Admission: RE | Admit: 2013-05-16 | Payer: PRIVATE HEALTH INSURANCE | Source: Ambulatory Visit | Admitting: Ophthalmology

## 2013-05-16 SURGERY — PHACOEMULSIFICATION, CATARACT, WITH IOL INSERTION
Anesthesia: Monitor Anesthesia Care | Laterality: Left

## 2013-05-18 ENCOUNTER — Other Ambulatory Visit: Payer: Self-pay | Admitting: Ophthalmology

## 2013-05-18 NOTE — H&P (Signed)
Patient Record  Mitchell [name], Mitchell [name] Patient Number:  [id number] Date of Birth:  12/01/1960 Age:  53 years old    Gender:  male Date of Evaluation:  May 02, 2013  Chief Complaint:   Patient is seen back for Cataract evaluation OS. He is post Phaco IOL OD x 02/14/2013. He has no complaints OD . He has the same problem with inabilioty to see OS and is having problems with depth perception. His vision OS is obscurred severely. History of Present Illness:   53 yo male diabetichad cataracft surgery od 02/14/13 by Dr. Clarisa Kindred.  Patient says that he is seeing getter than before surgery.  Presents for post op evaluation. Past History:  Allergies:  None., Active Medications:   Other Medications:  Mucinex DM (dextromethorphan-guaifenesin) tablet extended release 12 hr 30-600 mg 1 tablet by mouth twice a day as directed tablet, eucerin cream Apply once a day as directed, metformin (metformin) tablet 1,000 mg Take 1 tablet by mouth twice a day as directed tablet, trazodone (trazodone) tablet 50 mg Take 1 tablet by mouth at bedtime as directed tablet, olanzapine (olanzapine) tablet 20 mg Take 1 tablet by mouth once a day as directed tablet, Glucotrol XL (glipizide) tablet extended release 24hr 10 mg Take 1 tablet by mouth once a day as directed tablet, Fora Tst Strips Use as directed once a day, Norvasc (amlodipine) tablet 10 mg Take 1 tablet by mouth every morning as directed tablet, lithium carbonate (lithium carbonate) tablet 300 mg Take 2 tablet by mouth twice a day as directed tablet, clonidine HCl (clonidine hcl) tablet 0.1 mg Take 1 tablet by mouth twice a day as directed tablet, Victoza 2-Pak (liraglutide) pen injector 0.6 mg/0.1 mL (18 mg/3 mL) Inject 6 mg subcutaneously once a day as directed mg, polyethylene glycol 3350 (polyethylene glycol 3350) powder 17 gram/dose Take 1 dissolved in water once a day as directed, 8 ounce, loratadine (loratadine) tablet 10 mg Take 1 tablet by mouth once a day as  directed tablet, lisinopril (lisinopril) tablet 40 mg Take 1 tablet by mouth every morning as directed tablet, aspirin (aspirin) tablet,delayed release (DR/EC) 81 mg Take 1 tablet by mouth every morning tablet Birth History:  none Past Ocular History:  none Past Medical History:   Diabetes Mellitus Type II Hypertension Past Surgical History:  none Family History:  no amblyopia, + blindness (mother), no cataracts, no crossed eyes, no diabetic retinopathy, no glaucoma, no macular degeneration, no retinal detachment, + cancer (cousin), + diabetes (mother), no heart disease, + high blood pressure (mother), no stroke Social History:   Smoking Status: never smoker  Alcohol:  none   Driving status:  not driving Marital status:  single  Living arrangements:  other Review of Systems:   Constitutional:  no fever, no weight loss    Eyes: + blurred vision  Ear/Nose/Throat:  no hearing loss, no sinus problems    Cardiovascular:  no chest pain, no irregular heart beats    Respiratory:  no shortness of breath, no wheezing    Gastrointestinal:  no abdominal pain, no nausea    Genitourinary:  no blood in urine, no discomfort    Musculoskeletal:  no joint pain, no low back pain    Integumentary skin/breast:  no rashes, no skin tumors    Neurological:  no numbness, no weakness    Psychiatric:  no anxiety, no depression    Endocrine:  no heat intolerance, no thyroid problems    Hematologic/Lymphatic:  no anemia, no  unusual bleeding    Allergic/Immunologic:  no hives, no seasonal allergies    All other systems are negative.  Examination:  Visual Acuity:   Distance [facility] [place]:  OD: 20/30-1    OS: 20/300 IOP:  OD:  15     OS:  17    @ 05:48PM (Goldmann applanation) Manifest Refraction:    Sphere    Cyl Axis       VA         Add       VA Prism Base R:  [place]                20/25                                  L: Unable                                                        comments: Unable to refract due  to dense PSC Cataract OS  Confrontation visual field:  OU:  Normal  Motility:  OU:  Normal  Pupils:  OU:  Shape, size, direct and consensual reaction normal  Adnexa:  Preauricular LN, lacrimal drainage, lacrimal glands, orbit normal  Eyelids:  Eyelids:  normal Conjunctiva:  OD:  Injected OS:  bulbar, palpebral normal  Cornea:  OD:  spk OS:  epithelium, stroma, endothelium, tear film normal  Anterior Chamber:  OD:  3+ deep, trace flare OU:  depth normal, no cell, no flare  Iris:  OU:  normal Dilation:  OU: AK-Dilate, Tropicacyl @ 01:03PM  Lens:  OD:  PC IOL in good position OS:  2+ nuclear sclerosis, 3+ posterior subcapsular, 2+ cortical cataract  Vitreous:  OU:  normal  Optic Disc:  OU:  cupping: 0.25  Macula:  OS:  hazy view OU:  normal  Vessels:  OS:  hazy view OU:  normal  Periphery:  OU:  normal                                                                 A Scan / IOLMaster:                                                                  AScan predicts +19.50 Diopter Acrysof MA50BM PC IOL for emmetropia OD   Orientation to person, place and time:  Normal  Mood and affect:  Normal  Impression:  366.19  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 We discussed the related symptoms , visual significance and when we intervene with surgery. We discussed the surgical  techniques used, risks and benefits of surgery.  He has done well popst Phaco IOL OD and desires to proceed with Phaco IOL OS.   He indicated understanding our discussion and felt that his questions had been answered to his satisfaction.  He indicates that he desires to proceed with the recommended treatment/care plan.  Patient Instructions:  Please do not eat anything after mignight the day before surgery. She may take morning medications with a sip of water. Return to clinic:  05/17/2013 at 4:00 PM for post-operative follow-up   (electronically signed)  Shade FloodGreer Evertt Chouinard, MD

## 2013-05-29 MED ORDER — TETRACAINE HCL 0.5 % OP SOLN: 2.0000 [drp] | OPHTHALMIC | Status: DC

## 2013-05-29 MED ORDER — GATIFLOXACIN 0.5 % OP SOLN: 1.0000 [drp] | OPHTHALMIC | Status: DC | PRN

## 2013-05-29 MED ORDER — PREDNISOLONE ACETATE 1 % OP SUSP: 1.0000 [drp] | OPHTHALMIC | Status: DC

## 2013-05-29 MED ORDER — PHENYLEPHRINE HCL 2.5 % OP SOLN: 1.0000 [drp] | OPHTHALMIC | Status: DC | PRN

## 2013-05-29 MED ORDER — CYCLOPENTOLATE HCL 1 % OP SOLN: 1.0000 [drp] | OPHTHALMIC | Status: DC | PRN

## 2013-05-29 NOTE — Progress Notes (Signed)
Call[facility] Spoke with Angelique BlonderDenise one of the caretakers and she was unaware the surgery was scheduled for tomorrow. She stated the surgery would have to be re-scheduled because she was unable to bring him tomorrow due to school and unable to make other arrangements. I contacted Dr. Clarisa KindredGeiger and made her aware and she instructed for the home to bring patient in for another office visit to get another eye exam / H&P in preparation for cataract surgery. Instructed Denise at the home to contact the office to schedule the visit and she stated she would contact them today at [phone] and [facility] by Dr. Clarisa KindredGeiger..Marland Kitchen

## 2013-05-30 ENCOUNTER — Ambulatory Visit (HOSPITAL_COMMUNITY): Admission: RE | Admit: 2013-05-30 | Payer: Medicare Other | Source: Ambulatory Visit | Admitting: Ophthalmology

## 2013-05-30 ENCOUNTER — Encounter (HOSPITAL_COMMUNITY): Admission: RE | Payer: Self-pay | Source: Ambulatory Visit

## 2013-05-30 SURGERY — PHACOEMULSIFICATION, CATARACT, WITH IOL INSERTION
Anesthesia: Monitor Anesthesia Care | Laterality: Left

## 2013-05-31 ENCOUNTER — Other Ambulatory Visit: Payer: Self-pay | Admitting: Ophthalmology

## 2013-05-31 NOTE — H&P (Signed)
Patient Record  Mitchell [name], Mitchell [name] Patient Number:  [id number] Date of Birth:  April 20, 1960 Age:  53 years old    Gender:  male Date of Evaluation:  May 31, 2013  Chief Complaint:   Patient is seen back for Cataract evaluation OS. He had to cancel surgery twice and is here for pre-op evaluation prior to rescheduling his surgery.  He has the same problem with inabilioty to see OS and is having problems with depth perception. His vision OS is obscurred severely. History of Present Illness:   53 yo male diabetichad cataracft surgery od 02/14/13 by Dr. Clarisa KindredGeiger.  Patient says that he is seeing getter than before surgery.  Presents for post op evaluation. Past History:  Allergies:  None., Active Medications:   Other Medications:  Mucinex DM (dextromethorphan-guaifenesin) tablet extended release 12 hr 30-600 mg 1 tablet by mouth twice a day as directed tablet, eucerin cream Apply once a day as directed, metformin (metformin) tablet 1,000 mg Take 1 tablet by mouth twice a day as directed tablet, trazodone (trazodone) tablet 50 mg Take 1 tablet by mouth at bedtime as directed tablet, olanzapine (olanzapine) tablet 20 mg Take 1 tablet by mouth once a day as directed tablet, Glucotrol XL (glipizide) tablet extended release 24hr 10 mg Take 1 tablet by mouth once a day as directed tablet, Fora Tst Strips Use as directed once a day, Norvasc (amlodipine) tablet 10 mg Take 1 tablet by mouth every morning as directed tablet, lithium carbonate (lithium carbonate) tablet 300 mg Take 2 tablet by mouth twice a day as directed tablet, clonidine HCl (clonidine hcl) tablet 0.1 mg Take 1 tablet by mouth twice a day as directed tablet, Victoza 2-Pak (liraglutide) pen injector 0.6 mg/0.1 mL (18 mg/3 mL) Inject 6 mg subcutaneously once a day as directed mg, polyethylene glycol 3350 (polyethylene glycol 3350) powder 17 gram/dose Take 1 dissolved in water once a day as directed, 8 ounce, loratadine (loratadine) tablet 10 mg Take 1  tablet by mouth once a day as directed tablet, lisinopril (lisinopril) tablet 40 mg Take 1 tablet by mouth every morning as directed tablet, aspirin (aspirin) tablet,delayed release (DR/EC) 81 mg Take 1 tablet by mouth every morning tablet Birth History:  none Past Ocular History:  none Past Medical History:   Diabetes Mellitus Type II Hypertension Past Surgical History:  none Family History:  no amblyopia, + blindness (mother), no cataracts, no crossed eyes, no diabetic retinopathy, no glaucoma, no macular degeneration, no retinal detachment, + cancer (cousin), + diabetes (mother), no heart disease, + high blood pressure (mother), no stroke Social History:   Smoking Status: never smoker  Alcohol:  none   Driving status:  not driving Marital status:  single  Living arrangements:  other Review of Systems:   Constitutional:  no fever, no weight loss    Eyes: + blurred vision  Ear/Nose/Throat:  no hearing loss, no sinus problems    Cardiovascular:  no chest pain, no irregular heart beats    Respiratory:  no shortness of breath, no wheezing    Gastrointestinal:  no abdominal pain, no nausea    Genitourinary:  no blood in urine, no discomfort    Musculoskeletal:  no joint pain, no low back pain    Integumentary skin/breast:  no rashes, no skin tumors    Neurological:  no numbness, no weakness    Psychiatric:  no anxiety, no depression    Endocrine:  no heat intolerance, no thyroid problems    Hematologic/Lymphatic:  no anemia, no unusual bleeding    Allergic/Immunologic:  no hives, no seasonal allergies    All other systems are negative.  Examination:  Visual Acuity:   Distance VA [place]:  OD: 20/30-1    OS: 20/300 IOP:  OD:  15     OS:  15    @ 05:09PM (Goldmann applanation) Manifest Refraction:    Sphere    Cyl Axis       VA         Add       VA Prism Base R:  [place]                20/25                                  L: Unable                                                          comments: Unable to refract due to dense PSC Cataract OS  Confrontation visual field:  OU:  Normal  Motility:  OU:  Normal  Pupils:  OU:  Shape, size, direct and consensual reaction normal  Adnexa:  Preauricular LN, lacrimal drainage, lacrimal glands, orbit normal  Eyelids:  Eyelids:  normal Conjunctiva:  OD:  Injected OS:  bulbar, palpebral normal  Cornea:  OD:  spk OS:  epithelium, stroma, endothelium, tear film normal  Anterior Chamber:  OD:  3+ deep, trace flare OU:  depth normal, no cell, no flare  Iris:  OU:  normal Dilation:  OU: AK-Dilate, Tropicacyl @ 01:03PM  Lens:  OD:  PC IOL in good position OS:  2+ nuclear sclerosis, 3+ posterior subcapsular, 2+ cortical cataract  Vitreous:  OU:  normal  Optic Disc:  OU:  cupping: 0.25  Macula:  OS:  hazy view OU:  normal  Vessels:  OS:  hazy view OU:  normal  Periphery:  OU:  normal A Scan / IOLMaster:  AScan predicts +19.50 Diopter Acrysof MA50BM PC IOL for emmetropia OD  Orientation to person, place and time:  Normal  Mood and affect:  Normal  Impression:  366.19  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 We discussed the related symptoms , visual significance and when we intervene with surgery. We discussed the surgical techniques used, risks and benefits of surgery.  He has done well popst Phaco IOL OD and desires to proceed with Phaco IOL OS.   He indicated understanding our discussion and felt that his questions had been answered to his satisfaction.  He indicates that he desires to proceed with the recommended treatment/care plan.  Patient Instructions: Please do not eat anything after mignight the day before surgery. She may take morning  medications with a sip of water. Return to clinic:  06/14/2013 x 4:00PM for post-operative follow-up  Schedule:  Phacoemulsification, Posterior Chamber Intraocular Lens OS x 06/13/2013   (electronically signed)  Shade Flood, MD

## 2013-06-06 NOTE — Pre-Procedure Instructions (Signed)
Mitchell MeyerDavid [name]  06/06/2013   Your procedure is scheduled on:  Wed, Mar 18 @ 8:30 AM  [facility]  at 6:30 AM.  Call this number if you have problems the morning of surgery: [phone]   Remember:   Do not eat food or drink liquids after midnight.   Take these medicines the morning of surgery with A SIP OF WATER: Amlodipine(Norvasc),Clonidine(Catapres),Lithium, and Claritin(Loratadine)             Stop taking your Aspirin.  No Goody's,BC's,Aleve,Ibuprofen,Fish Oil,or any Herbal Medications   Do not wear jewelry  Do not wear lotions, powders, or colognes. You may wear deodorant.  Men may shave face and neck.  Do not bring valuables to the hospital.  [facility] is not responsible                  for any belongings or valuables.               Contacts, dentures or bridgework may not be worn into surgery.  Leave suitcase in the car. After surgery it may be brought to your room.  For patients admitted to the hospital, discharge time is determined by your                treatment team.               Patients discharged the day of surgery will not be allowed to drive  home.    Special Instructions:  [facility] - Preparing for Surgery  Before surgery, you can play an important role.  Because skin is not sterile, your skin needs to be as free of germs as possible.  You can reduce the number of germs on you skin by washing with CHG (chlorahexidine gluconate) soap before surgery.  CHG is an antiseptic cleaner which kills germs and bonds with the skin to continue killing germs even after washing.  Please DO NOT use if you have an allergy to CHG or antibacterial soaps.  If your skin becomes reddened/irritated stop using the CHG and inform your nurse when you arrive at Short Stay.  Do not shave (including legs and underarms) for at least 48 hours prior to the first CHG shower.  You may shave your face.  Please follow these instructions carefully:   1.  Shower with CHG Soap  the night before surgery and the                                morning of Surgery.  2.  If you choose to wash your hair, wash your hair first as usual with your       normal shampoo.  3.  After you shampoo, rinse your hair and body thoroughly to remove the                      Shampoo.  4.  Use CHG as you would any other liquid soap.  You can apply chg directly       to the skin and wash gently with scrungie or a clean washcloth.  5.  Apply the CHG Soap to your body ONLY FROM THE NECK DOWN.        Do not use on open wounds or open sores.  Avoid contact with your eyes,       ears, mouth and genitals (private parts).  Wash genitals (private  parts)       with your normal soap.  6.  Wash thoroughly, paying special attention to the area where your surgery        will be performed.  7.  Thoroughly rinse your body with warm water from the neck down.  8.  DO NOT shower/wash with your normal soap after using and rinsing off       the CHG Soap.  9.  Pat yourself dry with a clean towel.            10.  Wear clean pajamas.            11.  Place clean sheets on your bed the night of your first shower and do not        sleep with pets.  Day of Surgery  Do not apply any lotions/deoderants the morning of surgery.  Please wear clean clothes to the hospital/surgery center.     Please read over the following fact sheets that you were given: Pain Booklet, Coughing and Deep Breathing and Surgical Site Infection Prevention

## 2013-06-07 ENCOUNTER — Encounter (HOSPITAL_COMMUNITY)
Admission: RE | Admit: 2013-06-07 | Discharge: 2013-06-07 | Disposition: A | Discharge status: Home / Self Care | Payer: Medicare Other | Source: Ambulatory Visit | Attending: Ophthalmology | Admitting: Ophthalmology

## 2013-06-07 ENCOUNTER — Encounter (HOSPITAL_COMMUNITY): Payer: Self-pay

## 2013-06-07 DIAGNOSIS — Z01812 Encounter for preprocedural laboratory examination: Secondary | ICD-10-CM | POA: Insufficient documentation

## 2013-06-07 LAB — BASIC METABOLIC PANEL
BUN: 13 mg/dL (ref 6–23)
CALCIUM: 9.7 mg/dL (ref 8.4–10.5)
CO2: 24 mEq/L (ref 19–32)
CREATININE: 1.02 mg/dL (ref 0.50–1.35)
Chloride: 101 mEq/L (ref 96–112)
GFR calc Af Amer: >90 mL/min (ref >90)
GFR calc non Af Amer: 83 mL/min — ABNORMAL LOW (ref >90)
GLUCOSE: 359 mg/dL — AB (ref 70–99)
Potassium: 5.2 mEq/L (ref 3.7–5.3)
Sodium: 138 mEq/L (ref 137–147)

## 2013-06-07 LAB — CBC
HCT: 39.8 % (ref 39.0–52.0)
Hemoglobin: 13.7 g/dL (ref 13.0–17.0)
MCH: 32 pg (ref 26.0–34.0)
MCHC: 34.4 g/dL (ref 30.0–36.0)
MCV: 93 fL (ref 78.0–100.0)
Platelets: 226 10*3/uL (ref 150–400)
RBC: 4.28 MIL/uL (ref 4.22–5.81)
RDW: 13 % (ref 11.5–15.5)
WBC: 7.1 10*3/uL (ref 4.0–10.5)

## 2013-06-08 NOTE — Progress Notes (Signed)
Anesthesia Chart Anesthesia: Patient is a 53 year old male scheduled for left eye cataract extraction on 06/13/13 by Dr. Clarisa KindredGeiger.   History includes former smoker, obesity, HTN, DM2, Schizoaffective disorder, Bipolar disorder, OSA, hyponatremia, right cataract extraction 02/14/13. PCP is listed as Dr. Fleet ContrasEdwin Avbuere. [facility].  EKG on 02/07/13 showed NSR.   CXR on 02/07/13 showed no active cardiopulmonary disease.  Preoperative labs noted. Na was normal at 138. Cr 1.02.  Non-fasting glucose at 9:53 AM yesterday was 359.  CBC WNL. I spoke with Angelique BlonderDenise who [facility] primary [facility].  She confirms glucose at PAT was done shortly after he ate breakfast.  He had also had dessert the night before.  He gets his fasting glucose checked regularly.  It was 190 this morning.  Angelique BlonderDenise reports that his fasting glucose is typically 200 or less.  He will get a fasting CBG on arrival.  If acceptable then plan to proceed.  Velna Ochsllison Zelenak, PA-C [facility]/Anesthesiology Phone [phone] 06/08/2013 1:48 PM

## 2013-06-12 MED ORDER — GATIFLOXACIN 0.5 % OP SOLN
1.0000 [drp] | OPHTHALMIC | Status: AC | PRN
  Administered 2013-06-13 (×3): 1 [drp] via OPHTHALMIC
  Filled 2013-06-12: qty 2.5

## 2013-06-12 MED ORDER — CYCLOPENTOLATE HCL 1 % OP SOLN
1.0000 [drp] | OPHTHALMIC | Status: AC | PRN
  Administered 2013-06-13 (×3): 1 [drp] via OPHTHALMIC
  Filled 2013-06-12: qty 2

## 2013-06-12 MED ORDER — TETRACAINE HCL 0.5 % OP SOLN
2.0000 [drp] | OPHTHALMIC | Status: AC
  Administered 2013-06-13: 2 [drp] via OPHTHALMIC
  Filled 2013-06-12: qty 2

## 2013-06-12 MED ORDER — PREDNISOLONE ACETATE 1 % OP SUSP
1.0000 [drp] | OPHTHALMIC | Status: AC
Start: 2013-06-13 — End: 2013-06-13
  Administered 2013-06-13: 1 [drp] via OPHTHALMIC
  Filled 2013-06-12: qty 5

## 2013-06-12 MED ORDER — PHENYLEPHRINE HCL 2.5 % OP SOLN
1.0000 [drp] | OPHTHALMIC | Status: AC | PRN
  Administered 2013-06-13 (×3): 1 [drp] via OPHTHALMIC
  Filled 2013-06-12: qty 15

## 2013-06-13 ENCOUNTER — Encounter (HOSPITAL_COMMUNITY)
Admission: RE | Disposition: A | Discharge status: Home / Self Care | Payer: Self-pay | Source: Ambulatory Visit | Attending: Ophthalmology

## 2013-06-13 ENCOUNTER — Ambulatory Visit (HOSPITAL_COMMUNITY)
Admission: RE | Admit: 2013-06-13 | Discharge: 2013-06-13 | Disposition: A | Discharge status: Home / Self Care | Payer: PRIVATE HEALTH INSURANCE | Source: Ambulatory Visit | Attending: Ophthalmology | Admitting: Ophthalmology

## 2013-06-13 ENCOUNTER — Encounter (HOSPITAL_COMMUNITY): Payer: PRIVATE HEALTH INSURANCE | Admitting: Vascular Surgery

## 2013-06-13 ENCOUNTER — Ambulatory Visit (HOSPITAL_COMMUNITY): Payer: PRIVATE HEALTH INSURANCE | Admitting: Anesthesiology

## 2013-06-13 ENCOUNTER — Encounter (HOSPITAL_COMMUNITY): Payer: Self-pay | Admitting: Anesthesiology

## 2013-06-13 DIAGNOSIS — I10 Essential (primary) hypertension: Secondary | ICD-10-CM | POA: Insufficient documentation

## 2013-06-13 DIAGNOSIS — H25049 Posterior subcapsular polar age-related cataract, unspecified eye: Secondary | ICD-10-CM | POA: Insufficient documentation

## 2013-06-13 DIAGNOSIS — H2510 Age-related nuclear cataract, unspecified eye: Secondary | ICD-10-CM | POA: Insufficient documentation

## 2013-06-13 DIAGNOSIS — G4730 Sleep apnea, unspecified: Secondary | ICD-10-CM | POA: Insufficient documentation

## 2013-06-13 DIAGNOSIS — Z961 Presence of intraocular lens: Secondary | ICD-10-CM | POA: Insufficient documentation

## 2013-06-13 DIAGNOSIS — H25019 Cortical age-related cataract, unspecified eye: Secondary | ICD-10-CM | POA: Insufficient documentation

## 2013-06-13 DIAGNOSIS — E1139 Type 2 diabetes mellitus with other diabetic ophthalmic complication: Secondary | ICD-10-CM | POA: Insufficient documentation

## 2013-06-13 HISTORY — PX: CATARACT EXTRACTION W/PHACO: SHX586

## 2013-06-13 LAB — GLUCOSE, CAPILLARY
GLUCOSE-CAPILLARY: 194 mg/dL — AB (ref 70–99)
GLUCOSE-CAPILLARY: 146 mg/dL — AB (ref 70–99)

## 2013-06-13 SURGERY — PHACOEMULSIFICATION, CATARACT, WITH IOL INSERTION
Anesthesia: Monitor Anesthesia Care | Site: Eye | Laterality: Left

## 2013-06-13 MED ORDER — PROVISC 10 MG/ML IO SOLN
INTRAOCULAR | Status: DC | PRN
  Administered 2013-06-13: 0.85 mL via INTRAOCULAR

## 2013-06-13 MED ORDER — TRIAMCINOLONE ACETONIDE 40 MG/ML IJ SUSP
INTRAMUSCULAR | Status: AC
  Filled 2013-06-13: qty 5

## 2013-06-13 MED ORDER — LIDOCAINE HCL 2 % IJ SOLN
INTRAMUSCULAR | Status: AC
  Filled 2013-06-13: qty 20

## 2013-06-13 MED ORDER — HYPROMELLOSE (GONIOSCOPIC) 2.5 % OP SOLN
OPHTHALMIC | Status: AC
  Filled 2013-06-13: qty 15

## 2013-06-13 MED ORDER — HYPROMELLOSE (GONIOSCOPIC) 2.5 % OP SOLN
OPHTHALMIC | Status: DC | PRN
  Administered 2013-06-13: 2 [drp] via OPHTHALMIC

## 2013-06-13 MED ORDER — BUPIVACAINE HCL (PF) 0.75 % IJ SOLN
INTRAMUSCULAR | Status: DC | PRN
  Administered 2013-06-13: 09:00:00 via RETROBULBAR

## 2013-06-13 MED ORDER — TETRACAINE HCL 0.5 % OP SOLN
OPHTHALMIC | Status: AC
  Filled 2013-06-13: qty 2

## 2013-06-13 MED ORDER — BUPIVACAINE HCL (PF) 0.75 % IJ SOLN
INTRAMUSCULAR | Status: AC
  Filled 2013-06-13: qty 10

## 2013-06-13 MED ORDER — CEFAZOLIN SUBCONJUNCTIVAL INJECTION 100 MG/0.5 ML
200.0000 mg | INJECTION | SUBCONJUNCTIVAL | Status: DC
  Filled 2013-06-13: qty 1

## 2013-06-13 MED ORDER — NA CHONDROIT SULF-NA HYALURON 40-30 MG/ML IO SOLN
INTRAOCULAR | Status: AC
  Filled 2013-06-13: qty 0.5

## 2013-06-13 MED ORDER — LIDOCAINE HCL (CARDIAC) 20 MG/ML IV SOLN
INTRAVENOUS | Status: DC | PRN
  Administered 2013-06-13: 20 mg via INTRAVENOUS

## 2013-06-13 MED ORDER — STERILE WATER FOR IRRIGATION IR SOLN
Status: DC | PRN
  Administered 2013-06-13: 200 mL

## 2013-06-13 MED ORDER — EPINEPHRINE HCL 1 MG/ML IJ SOLN
INTRAMUSCULAR | Status: AC
  Filled 2013-06-13: qty 1

## 2013-06-13 MED ORDER — DEXAMETHASONE SODIUM PHOSPHATE 10 MG/ML IJ SOLN
INTRAMUSCULAR | Status: AC
  Filled 2013-06-13: qty 1

## 2013-06-13 MED ORDER — EPINEPHRINE HCL 1 MG/ML IJ SOLN
INTRAOCULAR | Status: DC | PRN
  Administered 2013-06-13: 08:00:00

## 2013-06-13 MED ORDER — DEXAMETHASONE SODIUM PHOSPHATE 10 MG/ML IJ SOLN
INTRAMUSCULAR | Status: DC | PRN
  Administered 2013-06-13: 10 mg via INTRAVENOUS

## 2013-06-13 MED ORDER — SODIUM CHLORIDE 0.9 % IV SOLN
INTRAVENOUS | Status: DC | PRN
  Administered 2013-06-13: 08:00:00 via INTRAVENOUS

## 2013-06-13 MED ORDER — PROPOFOL 10 MG/ML IV BOLUS
INTRAVENOUS | Status: AC
  Filled 2013-06-13: qty 20

## 2013-06-13 MED ORDER — PROPOFOL 10 MG/ML IV BOLUS
INTRAVENOUS | Status: DC | PRN
  Administered 2013-06-13: 30 mg via INTRAVENOUS

## 2013-06-13 MED ORDER — 0.9 % SODIUM CHLORIDE (POUR BTL) OPTIME
TOPICAL | Status: DC | PRN
  Administered 2013-06-13: 200 mL

## 2013-06-13 MED ORDER — SODIUM HYALURONATE 10 MG/ML IO SOLN
INTRAOCULAR | Status: AC
  Filled 2013-06-13: qty 0.85

## 2013-06-13 MED ORDER — CEFAZOLIN SUBCONJUNCTIVAL INJECTION 100 MG/0.5 ML
INJECTION | SUBCONJUNCTIVAL | Status: DC | PRN
  Administered 2013-06-13: 200 mg via SUBCONJUNCTIVAL

## 2013-06-13 MED ORDER — ACETAZOLAMIDE SODIUM 500 MG IJ SOLR
INTRAMUSCULAR | Status: AC
  Filled 2013-06-13: qty 500

## 2013-06-13 MED ORDER — BSS IO SOLN
INTRAOCULAR | Status: AC
  Filled 2013-06-13: qty 500

## 2013-06-13 MED ORDER — BACITRACIN-POLYMYXIN B 500-10000 UNIT/GM OP OINT
TOPICAL_OINTMENT | OPHTHALMIC | Status: AC
  Filled 2013-06-13: qty 3.5

## 2013-06-13 MED ORDER — NA CHONDROIT SULF-NA HYALURON 40-30 MG/ML IO SOLN
INTRAOCULAR | Status: DC | PRN
  Administered 2013-06-13: 0.5 mL via INTRAOCULAR

## 2013-06-13 SURGICAL SUPPLY — 51 items
APPLICATOR COTTON TIP 6IN STRL (MISCELLANEOUS) ×3 IMPLANT
APPLICATOR DR MATTHEWS STRL (MISCELLANEOUS) ×3 IMPLANT
BLADE KERATOME 2.75 (BLADE) ×2 IMPLANT
BLADE KERATOME 2.75MM (BLADE) ×1
BLADE STAB KNIFE 15DEG (BLADE) IMPLANT
COVER MAYO STAND STRL (DRAPES) ×3 IMPLANT
DRAPE OPHTHALMIC 77X100 STRL (CUSTOM PROCEDURE TRAY) ×3 IMPLANT
DRAPE POUCH INSTRU U-SHP 10X18 (DRAPES) ×3 IMPLANT
DRSG TEGADERM 4X4.75 (GAUZE/BANDAGES/DRESSINGS) ×3 IMPLANT
FILTER BLUE MILLIPORE (MISCELLANEOUS) IMPLANT
GLOVE BIO SURGEON STRL SZ7 (GLOVE) ×3 IMPLANT
GLOVE SS BIOGEL STRL SZ 6.5 (GLOVE) ×1 IMPLANT
GLOVE SUPERSENSE BIOGEL SZ 6.5 (GLOVE) ×2
GLOVE SURG SS PI 7.0 STRL IVOR (GLOVE) ×3 IMPLANT
GOWN STRL REUS W/ TWL LRG LVL3 (GOWN DISPOSABLE) ×1 IMPLANT
GOWN STRL REUS W/ TWL XL LVL3 (GOWN DISPOSABLE) ×1 IMPLANT
GOWN STRL REUS W/TWL LRG LVL3 (GOWN DISPOSABLE) ×2
GOWN STRL REUS W/TWL XL LVL3 (GOWN DISPOSABLE) ×2
KIT BASIN OR (CUSTOM PROCEDURE TRAY) ×3 IMPLANT
KIT ROOM TURNOVER OR (KITS) ×3 IMPLANT
KNIFE GRIESHABER SHARP 2.5MM (MISCELLANEOUS) ×3 IMPLANT
LENS IOL ACRYSOF MP POST 19.5 (Intraocular Lens) ×3 IMPLANT
NEEDLE 22X1 1/2 (OR ONLY) (NEEDLE) IMPLANT
NEEDLE 25GX 5/8IN NON SAFETY (NEEDLE) ×6 IMPLANT
NEEDLE FILTER BLUNT 18X 1/2SAF (NEEDLE)
NEEDLE FILTER BLUNT 18X1 1/2 (NEEDLE) IMPLANT
NEEDLE HYPO 30X.5 LL (NEEDLE) ×6 IMPLANT
NS IRRIG 1000ML POUR BTL (IV SOLUTION) ×3 IMPLANT
PACK CATARACT CUSTOM (CUSTOM PROCEDURE TRAY) ×3 IMPLANT
PAD ARMBOARD 7.5X6 YLW CONV (MISCELLANEOUS) ×3 IMPLANT
PAD EYE OVAL STERILE LF (GAUZE/BANDAGES/DRESSINGS) IMPLANT
PAK PIK CVS CATARACT (OPHTHALMIC) ×3 IMPLANT
PROBE ANTERIOR 20G W/INFUS NDL (MISCELLANEOUS) IMPLANT
SHUTTLE MONARCH TYPE A (NEEDLE) ×3 IMPLANT
SPEAR EYE SURG WECK-CEL (MISCELLANEOUS) IMPLANT
SUT ETHILON 10-0 CS-B-6CS-B-6 (SUTURE)
SUT ETHILON 5 0 P 3 18 (SUTURE)
SUT ETHILON 9 0 TG140 8 (SUTURE) IMPLANT
SUT NYLON ETHILON 5-0 P-3 1X18 (SUTURE) IMPLANT
SUT PLAIN 6 0 TG1408 (SUTURE) IMPLANT
SUT POLY NON ABSORB 10-0 8 STR (SUTURE) IMPLANT
SUT VICRYL 6 0 S 29 12 (SUTURE) IMPLANT
SUTURE EHLN 10-0 CS-B-6CS-B-6 (SUTURE) IMPLANT
SYR 20CC LL (SYRINGE) IMPLANT
SYR TB 1ML LUER SLIP (SYRINGE) IMPLANT
SYRINGE 10CC LL (SYRINGE) IMPLANT
TIP ABS 45DEG FLARED 0.9MM (TIP) ×3 IMPLANT
TOWEL OR 17X24 6PK STRL BLUE (TOWEL DISPOSABLE) ×6 IMPLANT
WATER STERILE IRR 1000ML POUR (IV SOLUTION) ×3 IMPLANT
WIPE INSTRUMENT ADHESIVE BACK (MISCELLANEOUS) ×3 IMPLANT
WIPE INSTRUMENT VISIWIPE 73X73 (MISCELLANEOUS) ×3 IMPLANT

## 2013-06-13 NOTE — Op Note (Signed)
Pollie MeyerDavid Breslin 06/13/2013 Cataract: Combined, Nuclear  Procedure: Phacoemulsification, Posterior Chamber Intra-ocular Lens Operative Eye:  left eye  Surgeon: Shade FloodGEIGER, Mckinleigh Schuchart Estimated Blood Loss: minimal Specimens for Pathology:  None Complications: none  The patient was prepared and draped in the usual manner for ocular surgery on the left eye. A Cook lid speculum was placed. A peripheral clear corneal incision was made at the surgical limbus centered at the 11:00 meridian. A separate clear corneal stab incision was made with a 15 degree blade at the 2:00 meridian to permit bi-manual technique. Viscoat and  Provisc as an underlying layer next to the capsule was instilled into the anterior chamber through that incision.  A keratome was used to create a self sealing incision entering the anterior chamber at the 11:00 meridian. A capsulorhexis was performed using a bent 25g needle. The lens was hydrodissected and the nucleus was hydrodilineated using a Nichammin cannula. The Chang chopper was inserted and used to rotate the lens to insure adequate lens mobility. The phacoemulsification handpiece was inserted and a combined phaco-chop technique was employed, fracturing the lens into separate sections with subsequent removal with the phaco handpiece.   The I/A cannula was used to remove remaining lens cortex. Provisc was instilled and used to deepen the anterior chamber and posterior capsule bag. The Monarch injector was used to place a folded Acrysof MA50BM PC IOL, + 19.50  diopters, into the capsule bag. A Sinskey lens hook was used to dial in the trailing haptic.  The I/A cannula was used to remove the viscoelastic from the anterior chamber. BSS was used to bring IOP to the desired range and the wound was checked to insure it was watertight. Subconjunctival injections of Ancef 100/0.335ml and Dexamethasone 0.5 ml of a 10mg /551ml solution were placed without complication. The lid speculum and drapes were removed  and the patient's eye was patched with Polymixin/Bacitracin ophthalmic ointment. An eye shield was placed and the patient was transferred alert and conversant from the operating room to the post-operative recovery area.   Shade FloodGEIGER, Dhiren Azimi, MD

## 2013-06-13 NOTE — Transfer of Care (Signed)
Immediate Anesthesia Transfer of Care Note  Patient: Mitchell MeyerDavid [name]  Procedure(s) Performed: Procedure(s): CATARACT EXTRACTION PHACO AND INTRAOCULAR LENS PLACEMENT (IOC) (Left)  Patient Location: Short Stay  Anesthesia Type:MAC  Level of Consciousness: awake and alert   Airway & Oxygen Therapy: Patient Spontanous Breathing  Post-op Assessment: Report given to PACU RN and Post -op Vital signs reviewed and stable  Post vital signs: Reviewed and stable  Complications: No apparent anesthesia complications

## 2013-06-13 NOTE — H&P (View-Only) (Signed)
Patient Record  Mitchell [name], Mitchell [name] Patient Number:  [id number] Date of Birth:  April 20, 1960 Age:  53 years old    Gender:  male Date of Evaluation:  May 31, 2013  Chief Complaint:   Patient is seen back for Cataract evaluation OS. He had to cancel surgery twice and is here for pre-op evaluation prior to rescheduling his surgery.  He has the same problem with inabilioty to see OS and is having problems with depth perception. His vision OS is obscurred severely. History of Present Illness:   53 yo male diabetichad cataracft surgery od 02/14/13 by Dr. Clarisa KindredGeiger.  Patient says that he is seeing getter than before surgery.  Presents for post op evaluation. Past History:  Allergies:  None., Active Medications:   Other Medications:  Mucinex DM (dextromethorphan-guaifenesin) tablet extended release 12 hr 30-600 mg 1 tablet by mouth twice a day as directed tablet, eucerin cream Apply once a day as directed, metformin (metformin) tablet 1,000 mg Take 1 tablet by mouth twice a day as directed tablet, trazodone (trazodone) tablet 50 mg Take 1 tablet by mouth at bedtime as directed tablet, olanzapine (olanzapine) tablet 20 mg Take 1 tablet by mouth once a day as directed tablet, Glucotrol XL (glipizide) tablet extended release 24hr 10 mg Take 1 tablet by mouth once a day as directed tablet, Fora Tst Strips Use as directed once a day, Norvasc (amlodipine) tablet 10 mg Take 1 tablet by mouth every morning as directed tablet, lithium carbonate (lithium carbonate) tablet 300 mg Take 2 tablet by mouth twice a day as directed tablet, clonidine HCl (clonidine hcl) tablet 0.1 mg Take 1 tablet by mouth twice a day as directed tablet, Victoza 2-Pak (liraglutide) pen injector 0.6 mg/0.1 mL (18 mg/3 mL) Inject 6 mg subcutaneously once a day as directed mg, polyethylene glycol 3350 (polyethylene glycol 3350) powder 17 gram/dose Take 1 dissolved in water once a day as directed, 8 ounce, loratadine (loratadine) tablet 10 mg Take 1  tablet by mouth once a day as directed tablet, lisinopril (lisinopril) tablet 40 mg Take 1 tablet by mouth every morning as directed tablet, aspirin (aspirin) tablet,delayed release (DR/EC) 81 mg Take 1 tablet by mouth every morning tablet Birth History:  none Past Ocular History:  none Past Medical History:   Diabetes Mellitus Type II Hypertension Past Surgical History:  none Family History:  no amblyopia, + blindness (mother), no cataracts, no crossed eyes, no diabetic retinopathy, no glaucoma, no macular degeneration, no retinal detachment, + cancer (cousin), + diabetes (mother), no heart disease, + high blood pressure (mother), no stroke Social History:   Smoking Status: never smoker  Alcohol:  none   Driving status:  not driving Marital status:  single  Living arrangements:  other Review of Systems:   Constitutional:  no fever, no weight loss    Eyes: + blurred vision  Ear/Nose/Throat:  no hearing loss, no sinus problems    Cardiovascular:  no chest pain, no irregular heart beats    Respiratory:  no shortness of breath, no wheezing    Gastrointestinal:  no abdominal pain, no nausea    Genitourinary:  no blood in urine, no discomfort    Musculoskeletal:  no joint pain, no low back pain    Integumentary skin/breast:  no rashes, no skin tumors    Neurological:  no numbness, no weakness    Psychiatric:  no anxiety, no depression    Endocrine:  no heat intolerance, no thyroid problems    Hematologic/Lymphatic:  no anemia, no unusual bleeding    Allergic/Immunologic:  no hives, no seasonal allergies    All other systems are negative.  Examination:  Visual Acuity:   Distance VA [place]:  OD: 20/30-1    OS: 20/300 IOP:  OD:  15     OS:  15    @ 05:09PM (Goldmann applanation) Manifest Refraction:    Sphere    Cyl Axis       VA         Add       VA Prism Base R:  [place]                20/25                                  L: Unable                                                          comments: Unable to refract due to dense PSC Cataract OS  Confrontation visual field:  OU:  Normal  Motility:  OU:  Normal  Pupils:  OU:  Shape, size, direct and consensual reaction normal  Adnexa:  Preauricular LN, lacrimal drainage, lacrimal glands, orbit normal  Eyelids:  Eyelids:  normal Conjunctiva:  OD:  Injected OS:  bulbar, palpebral normal  Cornea:  OD:  spk OS:  epithelium, stroma, endothelium, tear film normal  Anterior Chamber:  OD:  3+ deep, trace flare OU:  depth normal, no cell, no flare  Iris:  OU:  normal Dilation:  OU: AK-Dilate, Tropicacyl @ 01:03PM  Lens:  OD:  PC IOL in good position OS:  2+ nuclear sclerosis, 3+ posterior subcapsular, 2+ cortical cataract  Vitreous:  OU:  normal  Optic Disc:  OU:  cupping: 0.25  Macula:  OS:  hazy view OU:  normal  Vessels:  OS:  hazy view OU:  normal  Periphery:  OU:  normal A Scan / IOLMaster:  AScan predicts +19.50 Diopter Acrysof MA50BM PC IOL for emmetropia OD  Orientation to person, place and time:  Normal  Mood and affect:  Normal  Impression:  366.19  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 We discussed the related symptoms , visual significance and when we intervene with surgery. We discussed the surgical techniques used, risks and benefits of surgery.  He has done well popst Phaco IOL OD and desires to proceed with Phaco IOL OS.   He indicated understanding our discussion and felt that his questions had been answered to his satisfaction.  He indicates that he desires to proceed with the recommended treatment/care plan.  Patient Instructions: Please do not eat anything after mignight the day before surgery. She may take morning  medications with a sip of water. Return to clinic:  06/14/2013 x 4:00PM for post-operative follow-up  Schedule:  Phacoemulsification, Posterior Chamber Intraocular Lens OS x 06/13/2013   (electronically signed)  Shade Flood, MD

## 2013-06-13 NOTE — Anesthesia Preprocedure Evaluation (Addendum)
Anesthesia Evaluation  Patient identified by MRN, date of birth, ID band Patient awake    Reviewed: Allergy & Precautions, H&P , NPO status , Patient's Chart, lab work & pertinent test results  Airway Mallampati: II TM Distance: >3 FB Neck ROM: Full    Dental  (+) Teeth Intact, Dental Advisory Given   Pulmonary sleep apnea ,  breath sounds clear to auscultation        Cardiovascular hypertension, Rhythm:Regular Rate:Normal     Neuro/Psych    GI/Hepatic negative GI ROS, Neg liver ROS,   Endo/Other  diabetes  Renal/GU      Musculoskeletal   Abdominal   Peds  Hematology   Anesthesia Other Findings   Reproductive/Obstetrics                          Anesthesia Physical Anesthesia Plan  ASA: III  Anesthesia Plan: MAC   Post-op Pain Management:    Induction: Intravenous  Airway Management Planned: Simple Face Mask and Nasal Cannula  Additional Equipment:   Intra-op Plan:   Post-operative Plan:   Informed Consent:   Dental advisory given  Plan Discussed with: CRNA, Anesthesiologist and Surgeon  Anesthesia Plan Comments:        Anesthesia Quick Evaluation

## 2013-06-13 NOTE — Discharge Instructions (Signed)
Mitchell MeyerDavid Bentsen      06/13/2013  Post-operative instructions for [name], MD  Caring for your eye:  Do not rub your eye and wash your hands before touching the eye area. This is important to avoid injury and infection.  You may use sterile gauze pads and sterile eye wash to cleanse the lid margins of mucous accumulation.  DO NOT REUSE GAUZE after wiping the eye. Use a new clean one if needed.  Be certain not to touch the top of the medication bottle to the eyelids to avoid contaminating your medicine bottle and causing infection.  After eye surgery the surface of the eye and eyelids may be puffy. You may note a red blotch(s) on the surface of the eye or a bruise on your eyelid. These are usually related to injections or instruments used in surgery and are not cause for alarm. You may also notice blood tinged tears on your eye pad, this is common and not cause for alarm. These findings will subside over the coming week or two.  Activity:  No jarring activities. Walking with assistance early on as needed is advised. Avoid straining and let me know if you have significant constipation. Do not bend over at the waist with the head below your waist to minimize risk of bleeding inside the eye.  Avoid getting water from washing your hair or showering  in your eye. Patch the eye if necessary during bathing to avoid contamination.    You may: watch television, work on you computer, read books, eat out, ride in a car.  Sleeping Position:  You may sleep in your customary manner.  Wear your eye shield for naps and sleeping at night for the first two weeks.   Eye Medication:  Zymaxid 1 gtt in operative eye four times daily.                               Prednisolobne Acetate 1% 1 gtt in operative  Eye four times daily  Wait a few minutes between your eye drops when placing them.   Resume your customary medications on your normal schedule.   Shade FloodGEIGER, Prateek Knipple MD    After office hours I can  be reached by calling:    [phone]

## 2013-06-13 NOTE — Interval H&P Note (Signed)
History and Physical Interval Note:  06/13/2013 8:18 AM  BP 134/75  Pulse 85  Temp(Src) 98 F (36.7 C) (Oral)  Resp 20  SpO2 99%  Mitchell MeyerDavid Eisenmenger  has presented today for surgery, with the diagnosis of Combined Cataract Left Eye  The various methods of treatment have been discussed with the patient and family. After consideration of risks, benefits and other options for treatment, the patient has consented to  Procedure(s): CATARACT EXTRACTION PHACO AND INTRAOCULAR LENS PLACEMENT (IOC) (Left) as a surgical intervention .  The patient's history has been reviewed, patient examined, no change in status, stable for surgery.  I have reviewed the patient's chart and labs.  Questions were answered to the patient's satisfaction.    There are no contraindications to planned surgery.   Shade FloodGEIGER, Kashon Kraynak, MD

## 2013-06-13 NOTE — Anesthesia Postprocedure Evaluation (Signed)
  Anesthesia Post-op Note  Patient: Mitchell MeyerDavid [name]  Procedure(s) Performed: Procedure(s): CATARACT EXTRACTION PHACO AND INTRAOCULAR LENS PLACEMENT (IOC) (Left)  Patient Location: PACU  Anesthesia Type:MAC  Level of Consciousness: awake  Airway and Oxygen Therapy: Patient Spontanous Breathing  Post-op Pain: mild  Post-op Assessment: Post-op Vital signs reviewed  Post-op Vital Signs: Reviewed  Complications: No apparent anesthesia complications

## 2013-06-15 ENCOUNTER — Encounter (HOSPITAL_COMMUNITY): Payer: Self-pay | Admitting: Ophthalmology

## 2014-03-25 ENCOUNTER — Encounter (HOSPITAL_COMMUNITY): Payer: Self-pay | Admitting: Emergency Medicine

## 2014-03-25 ENCOUNTER — Emergency Department (INDEPENDENT_AMBULATORY_CARE_PROVIDER_SITE_OTHER)
Admission: EM | Admit: 2014-03-25 | Discharge: 2014-03-25 | Disposition: A | Discharge status: Home / Self Care | Payer: PRIVATE HEALTH INSURANCE | Source: Home / Self Care | Attending: Family Medicine | Admitting: Family Medicine

## 2014-03-25 DIAGNOSIS — J069 Acute upper respiratory infection, unspecified: Secondary | ICD-10-CM | POA: Diagnosis not present

## 2014-03-25 MED ORDER — IPRATROPIUM BROMIDE 0.06 % NA SOLN: 2.0000 | Freq: Four times a day (QID) | NASAL | Status: DC

## 2014-03-25 MED ORDER — TRAMADOL HCL 50 MG PO TABS: 50.0000 mg | ORAL_TABLET | Freq: Every evening | ORAL | Status: DC | PRN

## 2014-03-25 MED ORDER — ACETAMINOPHEN 500 MG PO TABS: 1000.0000 mg | ORAL_TABLET | Freq: Four times a day (QID) | ORAL | Status: DC | PRN

## 2014-03-25 NOTE — ED Provider Notes (Signed)
Mitchell [name] is a 53 y.o. male who presents to Urgent Care today for cough congestion and mild fever. Symptoms present for 2 days. No shortness of breath or significant wheezing. Patient has tried some over-the-counter medications which help. Patient has chronic psychiatric disorders and lives in a group home.   Past Medical History  Diagnosis Date  . Hyponatremia     history of  . Hypertension     takes Amlodipine,Lisinopril and Clonidine daily  . Seasonal allergies     takes Claritin daily  . Diabetes mellitus     takes Victoza,Metformin,and Glipizide daily  . Mental disorder     takes Lithium daily  . Bipolar affective disorder     takes Zyprexa daily  . Schizoaffective disorder     takes Trazodone nightly  . Sleep apnea     sleep study >1012yrs ago  . Stroke     left arm weakness   Past Surgical History  Procedure Laterality Date  . Circumcision  20 yrs. ago  . Cataract extraction w/phaco Right 02/14/2013    Procedure: CATARACT EXTRACTION PHACO AND INTRAOCULAR LENS PLACEMENT (IOC);  Surgeon: Shade FloodGreer Geiger, MD;  [facility];  Service: Ophthalmology;  Laterality: Right;  . Eye surgery    . Cataract extraction w/phaco Left 06/13/2013    Procedure: CATARACT EXTRACTION PHACO AND INTRAOCULAR LENS PLACEMENT (IOC);  Surgeon: Shade FloodGreer Geiger, MD;  [facility];  Service: Ophthalmology;  Laterality: Left;   History  Substance Use Topics  . Smoking status: Never Smoker   . Smokeless tobacco: Never Used  . Alcohol Use: No   ROS as above Medications: No current facility-administered medications for this encounter.   Current Outpatient Prescriptions  Medication Sig Dispense Refill  . amLODipine (NORVASC) 10 MG tablet Take 10 mg by mouth daily.    Marland Kitchen. aspirin EC 81 MG tablet Take 81 mg by mouth daily.    . cloNIDine (CATAPRES) 0.1 MG tablet Take 0.1 mg by mouth 2 (two) times daily.    Marland Kitchen. dextromethorphan-guaiFENesin (MUCINEX DM) 30-600 MG per 12 hr tablet Take 1 tablet by mouth 2  (two) times daily.    Marland Kitchen. glipiZIDE (GLUCOTROL XL) 10 MG 24 hr tablet Take 10 mg by mouth daily.    . Liraglutide (VICTOZA) 18 MG/3ML SOLN injection Inject 0.6 mg into the skin daily.    Marland Kitchen. lisinopril (PRINIVIL,ZESTRIL) 40 MG tablet Take 40 mg by mouth daily.    Marland Kitchen. lithium 300 MG tablet Take 600 mg by mouth 2 (two) times daily.     Marland Kitchen. loratadine (CLARITIN) 10 MG tablet Take 10 mg by mouth daily.    . metFORMIN (GLUCOPHAGE) 1000 MG tablet Take 1,000 mg by mouth 2 (two) times daily with a meal.    . OLANZapine (ZYPREXA) 20 MG tablet Take 20 mg by mouth at bedtime.    . polyethylene glycol (MIRALAX / GLYCOLAX) packet Take 17 g by mouth daily.    . Skin Protectants, Misc. (EUCERIN) cream Apply 1 application topically daily. To tops of feet    . traZODone (DESYREL) 50 MG tablet Take 50 mg by mouth at bedtime as needed for sleep.     Marland Kitchen. acetaminophen (TYLENOL) 500 MG tablet Take 2 tablets (1,000 mg total) by mouth every 6 (six) hours as needed for mild pain or fever. 90 tablet 1  . ipratropium (ATROVENT) 0.06 % nasal spray Place 2 sprays into both nostrils 4 (four) times daily. 15 mL 1  . pantoprazole (PROTONIX) 40 MG tablet  Take 40 mg by mouth daily.    . traMADol (ULTRAM) 50 MG tablet Take 1 tablet (50 mg total) by mouth at bedtime as needed (cough). 15 tablet 0   No Known Allergies   Exam:  BP 112/78 mmHg  Pulse 103  Temp(Src) 98.6 F (37 C) (Oral)  Resp 16  SpO2 95% Gen: Well NAD nontoxic appearing HEENT: EOMI,  MMM clear nasal discharge present. Normal tympanic membranes and posterior pharynx. Lungs: Normal work of breathing. CTABL Heart: Mild tachycardia but regular rhythm no MRG Abd: NABS, Soft. Nondistended, Nontender Exts: Brisk capillary refill, warm and well perfused.   No results found for this or any previous visit (from the past 24 hour(s)). No results found.  Assessment and Plan: 53 y.o. male with viral URI. Treatment with Tylenol, tramadol for cough, and Atrovent nasal  spray.  Discussed warning signs or symptoms. Please see discharge instructions. Patient expresses understanding.     Rodolph BongEvan S Imogine Carvell, MD 03/25/14 1100

## 2014-03-25 NOTE — Discharge Instructions (Signed)
Thank you for coming in today. Take Tylenol every 6 hours as needed for pain fevers or chills. Use nasal spray every 4-6 hours. Use cough medicine at bedtime to help prevent coughing with sleep. Call or go to the emergency room if you get worse, have trouble breathing, have chest pains, or palpitations.    Upper Respiratory Infection, Adult An upper respiratory infection (URI) is also sometimes known as the common cold. The upper respiratory tract includes the nose, sinuses, throat, trachea, and bronchi. Bronchi are the airways leading to the lungs. Most people improve within 1 week, but symptoms can last up to 2 weeks. A residual cough may last even longer.  CAUSES Many different viruses can infect the tissues lining the upper respiratory tract. The tissues become irritated and inflamed and often become very moist. Mucus production is also common. A cold is contagious. You can easily spread the virus to others by oral contact. This includes kissing, sharing a glass, coughing, or sneezing. Touching your mouth or nose and then touching a surface, which is then touched by another person, can also spread the virus. SYMPTOMS  Symptoms typically develop 1 to 3 days after you come in contact with a cold virus. Symptoms vary from person to person. They may include:  Runny nose.  Sneezing.  Nasal congestion.  Sinus irritation.  Sore throat.  Loss of voice (laryngitis).  Cough.  Fatigue.  Muscle aches.  Loss of appetite.  Headache.  Low-grade fever. DIAGNOSIS  You might diagnose your own cold based on familiar symptoms, since most people get a cold 2 to 3 times a year. Your caregiver can confirm this based on your exam. Most importantly, your caregiver can check that your symptoms are not due to another disease such as strep throat, sinusitis, pneumonia, asthma, or epiglottitis. Blood tests, throat tests, and X-rays are not necessary to diagnose a common cold, but they may sometimes be  helpful in excluding other more serious diseases. Your caregiver will decide if any further tests are required. RISKS AND COMPLICATIONS  You may be at risk for a more severe case of the common cold if you smoke cigarettes, have chronic heart disease (such as heart failure) or lung disease (such as asthma), or if you have a weakened immune system. The very young and very old are also at risk for more serious infections. Bacterial sinusitis, middle ear infections, and bacterial pneumonia can complicate the common cold. The common cold can worsen asthma and chronic obstructive pulmonary disease (COPD). Sometimes, these complications can require emergency medical care and may be life-threatening. PREVENTION  The best way to protect against getting a cold is to practice good hygiene. Avoid oral or hand contact with people with cold symptoms. Wash your hands often if contact occurs. There is no clear evidence that vitamin C, vitamin E, echinacea, or exercise reduces the chance of developing a cold. However, it is always recommended to get plenty of rest and practice good nutrition. TREATMENT  Treatment is directed at relieving symptoms. There is no cure. Antibiotics are not effective, because the infection is caused by a virus, not by bacteria. Treatment may include:  Increased fluid intake. Sports drinks offer valuable electrolytes, sugars, and fluids.  Breathing heated mist or steam (vaporizer or shower).  Eating chicken soup or other clear broths, and maintaining good nutrition.  Getting plenty of rest.  Using gargles or lozenges for comfort.  Controlling fevers with ibuprofen or acetaminophen as directed by your caregiver.  Increasing usage of  your inhaler if you have asthma. Zinc gel and zinc lozenges, taken in the first 24 hours of the common cold, can shorten the duration and lessen the severity of symptoms. Pain medicines may help with fever, muscle aches, and throat pain. A variety of  non-prescription medicines are available to treat congestion and runny nose. Your caregiver can make recommendations and may suggest nasal or lung inhalers for other symptoms.  HOME CARE INSTRUCTIONS   Only take over-the-counter or prescription medicines for pain, discomfort, or fever as directed by your caregiver.  Use a warm mist humidifier or inhale steam from a shower to increase air moisture. This may keep secretions moist and make it easier to breathe.  Drink enough water and fluids to keep your urine clear or pale yellow.  Rest as needed.  Return to work when your temperature has returned to normal or as your caregiver advises. You may need to stay home longer to avoid infecting others. You can also use a face mask and careful hand washing to prevent spread of the virus. SEEK MEDICAL CARE IF:   After the first few days, you feel you are getting worse rather than better.  You need your caregiver's advice about medicines to control symptoms.  You develop chills, worsening shortness of breath, or brown or red sputum. These may be signs of pneumonia.  You develop yellow or brown nasal discharge or pain in the face, especially when you bend forward. These may be signs of sinusitis.  You develop a fever, swollen neck glands, pain with swallowing, or white areas in the back of your throat. These may be signs of strep throat. SEEK IMMEDIATE MEDICAL CARE IF:   You have a fever.  You develop severe or persistent headache, ear pain, sinus pain, or chest pain.  You develop wheezing, a prolonged cough, cough up blood, or have a change in your usual mucus (if you have chronic lung disease).  You develop sore muscles or a stiff neck. Document Released: 09/08/2000 Document Revised: 06/07/2011 Document Reviewed: 06/20/2013 [facility] Patient Information 2015 [employer] [place]. This information is not intended to replace advice given to you by your health care provider. Make sure you discuss any  questions you have with your health care provider. .Marland Kitchen

## 2014-03-25 NOTE — ED Notes (Signed)
Pt has been suffering from UR symptoms since Saturday.

## 2014-05-22 DIAGNOSIS — F200 Paranoid schizophrenia: Secondary | ICD-10-CM | POA: Diagnosis not present

## 2014-05-28 DIAGNOSIS — E119 Type 2 diabetes mellitus without complications: Secondary | ICD-10-CM | POA: Diagnosis not present

## 2014-05-28 DIAGNOSIS — F259 Schizoaffective disorder, unspecified: Secondary | ICD-10-CM | POA: Diagnosis not present

## 2014-05-28 DIAGNOSIS — Z125 Encounter for screening for malignant neoplasm of prostate: Secondary | ICD-10-CM | POA: Diagnosis not present

## 2014-05-28 DIAGNOSIS — I10 Essential (primary) hypertension: Secondary | ICD-10-CM | POA: Diagnosis not present

## 2014-05-28 DIAGNOSIS — Z13220 Encounter for screening for lipoid disorders: Secondary | ICD-10-CM | POA: Diagnosis not present

## 2014-06-04 DIAGNOSIS — L84 Corns and callosities: Secondary | ICD-10-CM | POA: Diagnosis not present

## 2014-06-04 DIAGNOSIS — L602 Onychogryphosis: Secondary | ICD-10-CM | POA: Diagnosis not present

## 2014-06-04 DIAGNOSIS — E1151 Type 2 diabetes mellitus with diabetic peripheral angiopathy without gangrene: Secondary | ICD-10-CM | POA: Diagnosis not present

## 2014-06-25 DIAGNOSIS — E119 Type 2 diabetes mellitus without complications: Secondary | ICD-10-CM | POA: Diagnosis not present

## 2014-06-25 DIAGNOSIS — H524 Presbyopia: Secondary | ICD-10-CM | POA: Diagnosis not present

## 2014-06-25 DIAGNOSIS — H35373 Puckering of macula, bilateral: Secondary | ICD-10-CM | POA: Diagnosis not present

## 2014-06-25 DIAGNOSIS — Z961 Presence of intraocular lens: Secondary | ICD-10-CM | POA: Diagnosis not present

## 2014-07-26 ENCOUNTER — Encounter (HOSPITAL_COMMUNITY): Payer: Self-pay | Admitting: Emergency Medicine

## 2014-07-26 ENCOUNTER — Emergency Department (HOSPITAL_COMMUNITY): Payer: Medicare Other

## 2014-07-26 ENCOUNTER — Emergency Department (HOSPITAL_COMMUNITY)
Admission: EM | Admit: 2014-07-26 | Discharge: 2014-07-27 | Disposition: A | Discharge status: Home / Self Care | Payer: Medicare Other | Attending: Emergency Medicine | Admitting: Emergency Medicine

## 2014-07-26 DIAGNOSIS — J069 Acute upper respiratory infection, unspecified: Secondary | ICD-10-CM | POA: Insufficient documentation

## 2014-07-26 DIAGNOSIS — F319 Bipolar disorder, unspecified: Secondary | ICD-10-CM | POA: Insufficient documentation

## 2014-07-26 DIAGNOSIS — Z7982 Long term (current) use of aspirin: Secondary | ICD-10-CM | POA: Diagnosis not present

## 2014-07-26 DIAGNOSIS — Z8669 Personal history of other diseases of the nervous system and sense organs: Secondary | ICD-10-CM | POA: Diagnosis not present

## 2014-07-26 DIAGNOSIS — Z7951 Long term (current) use of inhaled steroids: Secondary | ICD-10-CM | POA: Insufficient documentation

## 2014-07-26 DIAGNOSIS — Z79899 Other long term (current) drug therapy: Secondary | ICD-10-CM | POA: Insufficient documentation

## 2014-07-26 DIAGNOSIS — I10 Essential (primary) hypertension: Secondary | ICD-10-CM | POA: Insufficient documentation

## 2014-07-26 DIAGNOSIS — F259 Schizoaffective disorder, unspecified: Secondary | ICD-10-CM | POA: Diagnosis not present

## 2014-07-26 DIAGNOSIS — R05 Cough: Secondary | ICD-10-CM | POA: Diagnosis not present

## 2014-07-26 DIAGNOSIS — Z8673 Personal history of transient ischemic attack (TIA), and cerebral infarction without residual deficits: Secondary | ICD-10-CM | POA: Diagnosis not present

## 2014-07-26 DIAGNOSIS — B9789 Other viral agents as the cause of diseases classified elsewhere: Secondary | ICD-10-CM

## 2014-07-26 DIAGNOSIS — E119 Type 2 diabetes mellitus without complications: Secondary | ICD-10-CM | POA: Insufficient documentation

## 2014-07-26 DIAGNOSIS — R030 Elevated blood-pressure reading, without diagnosis of hypertension: Secondary | ICD-10-CM | POA: Diagnosis not present

## 2014-07-26 NOTE — ED Notes (Addendum)
[facility] C/o cough and nasal congestion since Sunday.  Pt reports cough has been dry.  Today he coughed up red tinged sputum.  NAD on triage exam.

## 2014-07-26 NOTE — ED Provider Notes (Signed)
CSN: [id number]     Arrival date & time 07/26/14  2227 History  This chart was scribed for Felicie Morn, NP working with Marisa Severin, MD by Evon Slack, ED Scribe. This patient was seen in room TR03C/TR03C and the patient's care was started at 11:28 PM.     Chief Complaint  Patient presents with  . Cough   HPI HPI Comments: Mitchell [name] is a 54 y.o. male who presents to the Emergency Department complaining of dry cough onset onset 6 days prior. Pt reports associated rhinorrhea and sore throat. Pt state that his sore throat is better with drinking fluids or eating soft foods. Pt doesn't report any medications PTA. Denies fever, chills, or nausea.   Past Medical History  Diagnosis Date  . Hyponatremia     history of  . Hypertension     takes Amlodipine,Lisinopril and Clonidine daily  . Seasonal allergies     takes Claritin daily  . Diabetes mellitus     takes Victoza,Metformin,and Glipizide daily  . Mental disorder     takes Lithium daily  . Bipolar affective disorder     takes Zyprexa daily  . Schizoaffective disorder     takes Trazodone nightly  . Sleep apnea     sleep study >13yrs ago  . Stroke     left arm weakness   Past Surgical History  Procedure Laterality Date  . Circumcision  20 yrs. ago  . Cataract extraction w/phaco Right 02/14/2013    Procedure: CATARACT EXTRACTION PHACO AND INTRAOCULAR LENS PLACEMENT (IOC);  Surgeon: Shade Flood, MD;  Location: [facility] OR;  Service: Ophthalmology;  Laterality: Right;  . Eye surgery    . Cataract extraction w/phaco Left 06/13/2013    Procedure: CATARACT EXTRACTION PHACO AND INTRAOCULAR LENS PLACEMENT (IOC);  Surgeon: Shade Flood, MD;  Location: [facility];  Service: Ophthalmology;  Laterality: Left;   No family history on file. History  Substance Use Topics  . Smoking status: Never Smoker   . Smokeless tobacco: Never Used  . Alcohol Use: No    Review of Systems  Constitutional: Negative for fever and chills.  HENT: Positive for  rhinorrhea.   Respiratory: Positive for cough.   Gastrointestinal: Negative for nausea.  All other systems reviewed and are negative.    Allergies  Review of patient's allergies indicates no known allergies.  Home Medications   Prior to Admission medications   Medication Sig Start Date End Date Taking? Authorizing Provider  acetaminophen (TYLENOL) 500 MG tablet Take 2 tablets (1,000 mg total) by mouth every 6 (six) hours as needed for mild pain or fever. 03/25/14   Rodolph Bong, MD  amLODipine (NORVASC) 10 MG tablet Take 10 mg by mouth daily.    Historical Provider, MD  aspirin EC 81 MG tablet Take 81 mg by mouth daily.    Historical Provider, MD  cloNIDine (CATAPRES) 0.1 MG tablet Take 0.1 mg by mouth 2 (two) times daily. 02/17/11   Viviann Spare, FNP  dextromethorphan-guaiFENesin [facility] DM) 30-600 MG per 12 hr tablet Take 1 tablet by mouth 2 (two) times daily.    Historical Provider, MD  glipiZIDE (GLUCOTROL XL) 10 MG 24 hr tablet Take 10 mg by mouth daily.    Historical Provider, MD  ipratropium (ATROVENT) 0.06 % nasal spray Place 2 sprays into both nostrils 4 (four) times daily. 03/25/14   Rodolph Bong, MD  Liraglutide (VICTOZA) 18 MG/3ML SOLN injection Inject 0.6 mg into the skin daily.  Historical Provider, MD  lisinopril (PRINIVIL,ZESTRIL) 40 MG tablet Take 40 mg by mouth daily.    Historical Provider, MD  lithium 300 MG tablet Take 600 mg by mouth 2 (two) times daily.     Historical Provider, MD  loratadine (CLARITIN) 10 MG tablet Take 10 mg by mouth daily.    Historical Provider, MD  metFORMIN (GLUCOPHAGE) 1000 MG tablet Take 1,000 mg by mouth 2 (two) times daily with a meal.    Historical Provider, MD  OLANZapine (ZYPREXA) 20 MG tablet Take 20 mg by mouth at bedtime.    Historical Provider, MD  pantoprazole (PROTONIX) 40 MG tablet Take 40 mg by mouth daily.    Historical Provider, MD  polyethylene glycol (MIRALAX / GLYCOLAX) packet Take 17 g by mouth daily.    Historical  Provider, MD  Skin Protectants, Misc. (EUCERIN) cream Apply 1 application topically daily. To tops of feet    Historical Provider, MD  traMADol (ULTRAM) 50 MG tablet Take 1 tablet (50 mg total) by mouth at bedtime as needed (cough). 03/25/14   Rodolph BongEvan S Corey, MD  traZODone (DESYREL) 50 MG tablet Take 50 mg by mouth at bedtime as needed for sleep.     Historical Provider, MD   BP 142/86 mmHg  Pulse 96  Temp(Src) 98.3 F (36.8 C) (Oral)  Resp 18  Ht 5\' 4"  (1.626 m)  Wt 227 lb (102.967 kg)  BMI 38.95 kg/m2  SpO2 96%   Physical Exam  Constitutional: He is oriented to person, place, and time. He appears well-developed and well-nourished. No distress.  HENT:  Head: Normocephalic and atraumatic.  Eyes: Conjunctivae and EOM are normal.  Neck: Neck supple. No tracheal deviation present.  Cardiovascular: Normal rate, regular rhythm and normal heart sounds.   Pulmonary/Chest: Effort normal and breath sounds normal. No respiratory distress. He has no wheezes. He has no rales.  Musculoskeletal: Normal range of motion.  Lymphadenopathy:    He has no cervical adenopathy.  Neurological: He is alert and oriented to person, place, and time.  Skin: Skin is warm and dry.  Psychiatric: He has a normal mood and affect. His behavior is normal.  Nursing note and vitals reviewed.   ED Course  Procedures (including critical care time) DIAGNOSTIC STUDIES: Oxygen Saturation is 96% on RA, adequate by my interpretation.    COORDINATION OF CARE: 11:40 PM-Discussed treatment plan with pt at bedside and pt agreed to plan.     Labs Review Labs Reviewed - No data to display  Imaging Review No results found.   EKG Interpretation None     Radiology results reviewed and shared with patient. MDM   Final diagnoses:  None   Viral URI with cough. No fever, respiratory distress, or wheezing. Instructions for symptomatic care provided. Return precautions discussed.   I personally performed the  services described in this documentation, which was scribed in my presence. The recorded information has been reviewed and is accurate.      Felicie Mornavid Margeaux Swantek, NP 07/27/14 [id number]  Marisa Severinlga Otter, MD 07/27/14 [phone]

## 2014-07-27 MED ORDER — DM-GUAIFENESIN ER 30-600 MG PO TB12: 1.0000 | ORAL_TABLET | Freq: Two times a day (BID) | ORAL | Status: DC

## 2014-07-27 NOTE — Discharge Instructions (Signed)
Upper Respiratory Infection, Adult An upper respiratory infection (URI) is also sometimes known as the common cold. The upper respiratory tract includes the nose, sinuses, throat, trachea, and bronchi. Bronchi are the airways leading to the lungs. Most people improve within 1 week, but symptoms can last up to 2 weeks. A residual cough may last even longer.  CAUSES Many different viruses can infect the tissues lining the upper respiratory tract. The tissues become irritated and inflamed and often become very moist. Mucus production is also common. A cold is contagious. You can easily spread the virus to others by oral contact. This includes kissing, sharing a glass, coughing, or sneezing. Touching your mouth or nose and then touching a surface, which is then touched by another person, can also spread the virus. SYMPTOMS  Symptoms typically develop 1 to 3 days after you come in contact with a cold virus. Symptoms vary from person to person. They may include:  Runny nose.  Sneezing.  Nasal congestion.  Sinus irritation.  Sore throat.  Loss of voice (laryngitis).  Cough.  Fatigue.  Muscle aches.  Loss of appetite.  Headache.  Low-grade fever. DIAGNOSIS  You might diagnose your own cold based on familiar symptoms, since most people get a cold 2 to 3 times a year. Your caregiver can confirm this based on your exam. Most importantly, your caregiver can check that your symptoms are not due to another disease such as strep throat, sinusitis, pneumonia, asthma, or epiglottitis. Blood tests, throat tests, and X-rays are not necessary to diagnose a common cold, but they may sometimes be helpful in excluding other more serious diseases. Your caregiver will decide if any further tests are required. RISKS AND COMPLICATIONS  You may be at risk for a more severe case of the common cold if you smoke cigarettes, have chronic heart disease (such as heart failure) or lung disease (such as asthma), or if  you have a weakened immune system. The very young and very old are also at risk for more serious infections. Bacterial sinusitis, middle ear infections, and bacterial pneumonia can complicate the common cold. The common cold can worsen asthma and chronic obstructive pulmonary disease (COPD). Sometimes, these complications can require emergency medical care and may be life-threatening. PREVENTION  The best way to protect against getting a cold is to practice good hygiene. Avoid oral or hand contact with people with cold symptoms. Wash your hands often if contact occurs. There is no clear evidence that vitamin C, vitamin E, echinacea, or exercise reduces the chance of developing a cold. However, it is always recommended to get plenty of rest and practice good nutrition. TREATMENT  Treatment is directed at relieving symptoms. There is no cure. Antibiotics are not effective, because the infection is caused by a virus, not by bacteria. Treatment may include:  Increased fluid intake. Sports drinks offer valuable electrolytes, sugars, and fluids.  Breathing heated mist or steam (vaporizer or shower).  Eating chicken soup or other clear broths, and maintaining good nutrition.  Getting plenty of rest.  Using gargles or lozenges for comfort.  Controlling fevers with ibuprofen or acetaminophen as directed by your caregiver.  Increasing usage of your inhaler if you have asthma. Zinc gel and zinc lozenges, taken in the first 24 hours of the common cold, can shorten the duration and lessen the severity of symptoms. Pain medicines may help with fever, muscle aches, and throat pain. A variety of non-prescription medicines are available to treat congestion and runny nose. Your caregiver   can make recommendations and may suggest nasal or lung inhalers for other symptoms.  HOME CARE INSTRUCTIONS   Only take over-the-counter or prescription medicines for pain, discomfort, or fever as directed by your  caregiver.  Use a warm mist humidifier or inhale steam from a shower to increase air moisture. This may keep secretions moist and make it easier to breathe.  Drink enough water and fluids to keep your urine clear or pale yellow.  Rest as needed.  Return to work when your temperature has returned to normal or as your caregiver advises. You may need to stay home longer to avoid infecting others. You can also use a face mask and careful hand washing to prevent spread of the virus. SEEK MEDICAL CARE IF:   After the first few days, you feel you are getting worse rather than better.  You need your caregiver's advice about medicines to control symptoms.  You develop chills, worsening shortness of breath, or brown or red sputum. These may be signs of pneumonia.  You develop yellow or brown nasal discharge or pain in the face, especially when you bend forward. These may be signs of sinusitis.  You develop a fever, swollen neck glands, pain with swallowing, or white areas in the back of your throat. These may be signs of strep throat. SEEK IMMEDIATE MEDICAL CARE IF:   You have a fever.  You develop severe or persistent headache, ear pain, sinus pain, or chest pain.  You develop wheezing, a prolonged cough, cough up blood, or have a change in your usual mucus (if you have chronic lung disease).  You develop sore muscles or a stiff neck. Document Released: 09/08/2000 Document Revised: 06/07/2011 Document Reviewed: 06/20/2013 [employer] Patient Information 2015 [employer]. This information is not intended to replace advice given to you by your health care provider. Make sure you discuss any questions you have with your health care provider.  

## 2014-07-27 NOTE — ED Notes (Signed)
Pt A&OX4, ambulatory at d/c with steady gait, NAD 

## 2014-07-29 DIAGNOSIS — I10 Essential (primary) hypertension: Secondary | ICD-10-CM | POA: Diagnosis not present

## 2014-07-29 DIAGNOSIS — J209 Acute bronchitis, unspecified: Secondary | ICD-10-CM | POA: Diagnosis not present

## 2014-07-29 DIAGNOSIS — G4730 Sleep apnea, unspecified: Secondary | ICD-10-CM | POA: Diagnosis not present

## 2014-07-29 DIAGNOSIS — E669 Obesity, unspecified: Secondary | ICD-10-CM | POA: Diagnosis not present

## 2014-07-29 DIAGNOSIS — E119 Type 2 diabetes mellitus without complications: Secondary | ICD-10-CM | POA: Diagnosis not present

## 2014-08-07 DIAGNOSIS — F200 Paranoid schizophrenia: Secondary | ICD-10-CM | POA: Diagnosis not present

## 2014-09-03 DIAGNOSIS — E669 Obesity, unspecified: Secondary | ICD-10-CM | POA: Diagnosis not present

## 2014-09-03 DIAGNOSIS — N529 Male erectile dysfunction, unspecified: Secondary | ICD-10-CM | POA: Diagnosis not present

## 2014-09-03 DIAGNOSIS — I10 Essential (primary) hypertension: Secondary | ICD-10-CM | POA: Diagnosis not present

## 2014-09-03 DIAGNOSIS — E119 Type 2 diabetes mellitus without complications: Secondary | ICD-10-CM | POA: Diagnosis not present

## 2014-09-03 DIAGNOSIS — F259 Schizoaffective disorder, unspecified: Secondary | ICD-10-CM | POA: Diagnosis not present

## 2014-09-06 DIAGNOSIS — L602 Onychogryphosis: Secondary | ICD-10-CM | POA: Diagnosis not present

## 2014-09-06 DIAGNOSIS — E1151 Type 2 diabetes mellitus with diabetic peripheral angiopathy without gangrene: Secondary | ICD-10-CM | POA: Diagnosis not present

## 2014-09-06 DIAGNOSIS — L84 Corns and callosities: Secondary | ICD-10-CM | POA: Diagnosis not present

## 2014-10-01 ENCOUNTER — Encounter (HOSPITAL_COMMUNITY): Payer: Self-pay | Admitting: *Deleted

## 2014-10-01 ENCOUNTER — Inpatient Hospital Stay (HOSPITAL_COMMUNITY)
Admission: EM | Admit: 2014-10-01 | Discharge: 2014-10-03 | DRG: 918 | Disposition: A | Discharge status: Home / Self Care | Payer: Medicare Other | Attending: Internal Medicine | Admitting: Internal Medicine

## 2014-10-01 DIAGNOSIS — E119 Type 2 diabetes mellitus without complications: Secondary | ICD-10-CM | POA: Diagnosis not present

## 2014-10-01 DIAGNOSIS — I10 Essential (primary) hypertension: Secondary | ICD-10-CM | POA: Diagnosis present

## 2014-10-01 DIAGNOSIS — J302 Other seasonal allergic rhinitis: Secondary | ICD-10-CM | POA: Diagnosis present

## 2014-10-01 DIAGNOSIS — R251 Tremor, unspecified: Secondary | ICD-10-CM | POA: Diagnosis not present

## 2014-10-01 DIAGNOSIS — Z79899 Other long term (current) drug therapy: Secondary | ICD-10-CM | POA: Diagnosis not present

## 2014-10-01 DIAGNOSIS — T43591A Poisoning by other antipsychotics and neuroleptics, accidental (unintentional), initial encounter: Secondary | ICD-10-CM | POA: Diagnosis not present

## 2014-10-01 DIAGNOSIS — Z79891 Long term (current) use of opiate analgesic: Secondary | ICD-10-CM

## 2014-10-01 DIAGNOSIS — T56891A Toxic effect of other metals, accidental (unintentional), initial encounter: Secondary | ICD-10-CM | POA: Diagnosis present

## 2014-10-01 DIAGNOSIS — G4730 Sleep apnea, unspecified: Secondary | ICD-10-CM | POA: Diagnosis not present

## 2014-10-01 DIAGNOSIS — I6789 Other cerebrovascular disease: Secondary | ICD-10-CM | POA: Diagnosis not present

## 2014-10-01 DIAGNOSIS — Z7982 Long term (current) use of aspirin: Secondary | ICD-10-CM | POA: Diagnosis not present

## 2014-10-01 DIAGNOSIS — G252 Other specified forms of tremor: Secondary | ICD-10-CM | POA: Diagnosis not present

## 2014-10-01 DIAGNOSIS — F250 Schizoaffective disorder, bipolar type: Secondary | ICD-10-CM | POA: Diagnosis present

## 2014-10-01 DIAGNOSIS — I69354 Hemiplegia and hemiparesis following cerebral infarction affecting left non-dominant side: Secondary | ICD-10-CM | POA: Diagnosis not present

## 2014-10-01 DIAGNOSIS — T56894D Toxic effect of other metals, undetermined, subsequent encounter: Secondary | ICD-10-CM | POA: Diagnosis not present

## 2014-10-01 DIAGNOSIS — T56894A Toxic effect of other metals, undetermined, initial encounter: Secondary | ICD-10-CM | POA: Diagnosis not present

## 2014-10-01 NOTE — ED Notes (Signed)
Bed: [id number] Expected date:  Expected time:  Means of arrival:  Comments: EMS 54 yo male with new onset "tremors"

## 2014-10-01 NOTE — ED Notes (Signed)
Pt states he is having "jerks" when walking, pt denies pain, denies weakness or any other symptoms, states it started Sunday.

## 2014-10-01 NOTE — ED Notes (Signed)
[facility]

## 2014-10-01 NOTE — ED Notes (Signed)
Per EMS pt from group home, pt states he has had some "shaking" today, started yesterday around lunch time, pt denies pain or weakness, no further complaints, pt ambulatory to room.

## 2014-10-02 ENCOUNTER — Emergency Department (HOSPITAL_COMMUNITY): Payer: Medicare Other

## 2014-10-02 DIAGNOSIS — T43591A Poisoning by other antipsychotics and neuroleptics, accidental (unintentional), initial encounter: Secondary | ICD-10-CM | POA: Diagnosis present

## 2014-10-02 DIAGNOSIS — J302 Other seasonal allergic rhinitis: Secondary | ICD-10-CM | POA: Diagnosis present

## 2014-10-02 DIAGNOSIS — I69354 Hemiplegia and hemiparesis following cerebral infarction affecting left non-dominant side: Secondary | ICD-10-CM | POA: Diagnosis not present

## 2014-10-02 DIAGNOSIS — R251 Tremor, unspecified: Secondary | ICD-10-CM | POA: Diagnosis not present

## 2014-10-02 DIAGNOSIS — G4730 Sleep apnea, unspecified: Secondary | ICD-10-CM | POA: Diagnosis present

## 2014-10-02 DIAGNOSIS — T56894A Toxic effect of other metals, undetermined, initial encounter: Secondary | ICD-10-CM

## 2014-10-02 DIAGNOSIS — G252 Other specified forms of tremor: Secondary | ICD-10-CM | POA: Diagnosis present

## 2014-10-02 DIAGNOSIS — Z7982 Long term (current) use of aspirin: Secondary | ICD-10-CM | POA: Diagnosis not present

## 2014-10-02 DIAGNOSIS — F250 Schizoaffective disorder, bipolar type: Secondary | ICD-10-CM | POA: Diagnosis not present

## 2014-10-02 DIAGNOSIS — I10 Essential (primary) hypertension: Secondary | ICD-10-CM | POA: Diagnosis present

## 2014-10-02 DIAGNOSIS — T56891A Toxic effect of other metals, accidental (unintentional), initial encounter: Secondary | ICD-10-CM | POA: Diagnosis present

## 2014-10-02 DIAGNOSIS — E119 Type 2 diabetes mellitus without complications: Secondary | ICD-10-CM | POA: Diagnosis present

## 2014-10-02 DIAGNOSIS — Z79899 Other long term (current) drug therapy: Secondary | ICD-10-CM | POA: Diagnosis not present

## 2014-10-02 DIAGNOSIS — E118 Type 2 diabetes mellitus with unspecified complications: Secondary | ICD-10-CM

## 2014-10-02 DIAGNOSIS — Z79891 Long term (current) use of opiate analgesic: Secondary | ICD-10-CM | POA: Diagnosis not present

## 2014-10-02 DIAGNOSIS — T56894D Toxic effect of other metals, undetermined, subsequent encounter: Secondary | ICD-10-CM | POA: Diagnosis not present

## 2014-10-02 HISTORY — DX: Toxic effect of other metals, accidental (unintentional), initial encounter: T56.891A

## 2014-10-02 HISTORY — DX: Other specified forms of tremor: G25.2

## 2014-10-02 LAB — CBC
HCT: 36.8 % — ABNORMAL LOW (ref 39.0–52.0)
Hemoglobin: 12.4 g/dL — ABNORMAL LOW (ref 13.0–17.0)
MCH: 31.3 pg (ref 26.0–34.0)
MCHC: 33.7 g/dL (ref 30.0–36.0)
MCV: 92.9 fL (ref 78.0–100.0)
PLATELETS: 206 10*3/uL (ref 150–400)
RBC: 3.96 MIL/uL — ABNORMAL LOW (ref 4.22–5.81)
RDW: 13.4 % (ref 11.5–15.5)
WBC: 6.2 10*3/uL (ref 4.0–10.5)

## 2014-10-02 LAB — CBC WITH DIFFERENTIAL/PLATELET
BASOS ABS: 0 10*3/uL (ref 0.0–0.1)
Basophils Relative: 0 % (ref 0–1)
EOS PCT: 4 % (ref 0–5)
Eosinophils Absolute: 0.3 10*3/uL (ref 0.0–0.7)
HEMATOCRIT: 35 % — AB (ref 39.0–52.0)
Hemoglobin: 11.9 g/dL — ABNORMAL LOW (ref 13.0–17.0)
Lymphocytes Relative: 34 % (ref 12–46)
Lymphs Abs: 2.3 10*3/uL (ref 0.7–4.0)
MCH: 31.2 pg (ref 26.0–34.0)
MCHC: 34 g/dL (ref 30.0–36.0)
MCV: 91.6 fL (ref 78.0–100.0)
Monocytes Absolute: 0.4 10*3/uL (ref 0.1–1.0)
Monocytes Relative: 6 % (ref 3–12)
Neutro Abs: 3.7 10*3/uL (ref 1.7–7.7)
Neutrophils Relative %: 56 % (ref 43–77)
PLATELETS: 213 10*3/uL (ref 150–400)
RBC: 3.82 MIL/uL — ABNORMAL LOW (ref 4.22–5.81)
RDW: 13.4 % (ref 11.5–15.5)
WBC: 6.7 10*3/uL (ref 4.0–10.5)

## 2014-10-02 LAB — COMPREHENSIVE METABOLIC PANEL
ALBUMIN: 3.8 g/dL (ref 3.5–5.0)
ALK PHOS: 124 U/L (ref 38–126)
ALT: 14 U/L — ABNORMAL LOW (ref 17–63)
ALT: 15 U/L — ABNORMAL LOW (ref 17–63)
AST: 19 U/L (ref 15–41)
AST: 22 U/L (ref 15–41)
Albumin: 4.1 g/dL (ref 3.5–5.0)
Alkaline Phosphatase: 132 U/L — ABNORMAL HIGH (ref 38–126)
Anion gap: 6 (ref 5–15)
Anion gap: 7 (ref 5–15)
BUN: 17 mg/dL (ref 6–20)
BUN: 17 mg/dL (ref 6–20)
CALCIUM: 9.6 mg/dL (ref 8.9–10.3)
CO2: 28 mmol/L (ref 22–32)
CO2: 28 mmol/L (ref 22–32)
CREATININE: 1.36 mg/dL — AB (ref 0.61–1.24)
Calcium: 9.7 mg/dL (ref 8.9–10.3)
Chloride: 102 mmol/L (ref 101–111)
Chloride: 104 mmol/L (ref 101–111)
Creatinine, Ser: 1.29 mg/dL — ABNORMAL HIGH (ref 0.61–1.24)
GFR calc non Af Amer: >60 mL/min (ref >60)
GFR, EST NON AFRICAN AMERICAN: 58 mL/min — AB (ref >60)
GLUCOSE: 121 mg/dL — AB (ref 65–99)
GLUCOSE: 97 mg/dL (ref 65–99)
POTASSIUM: 3.9 mmol/L (ref 3.5–5.1)
Potassium: 3.9 mmol/L (ref 3.5–5.1)
SODIUM: 138 mmol/L (ref 135–145)
Sodium: 137 mmol/L (ref 135–145)
TOTAL PROTEIN: 6.9 g/dL (ref 6.5–8.1)
Total Bilirubin: 0.3 mg/dL (ref 0.3–1.2)
Total Bilirubin: 0.4 mg/dL (ref 0.3–1.2)
Total Protein: 7.2 g/dL (ref 6.5–8.1)

## 2014-10-02 LAB — RAPID URINE DRUG SCREEN, HOSP PERFORMED
Amphetamines: NOT DETECTED
Barbiturates: NOT DETECTED
Benzodiazepines: NOT DETECTED
Cocaine: NOT DETECTED
OPIATES: NOT DETECTED
Tetrahydrocannabinol: NOT DETECTED

## 2014-10-02 LAB — LITHIUM LEVEL
LITHIUM LVL: 1.95 mmol/L — AB (ref 0.60–1.20)
LITHIUM LVL: 2.25 mmol/L — AB (ref 0.60–1.20)

## 2014-10-02 LAB — GLUCOSE, CAPILLARY
GLUCOSE-CAPILLARY: 118 mg/dL — AB (ref 65–99)
GLUCOSE-CAPILLARY: 157 mg/dL — AB (ref 65–99)
GLUCOSE-CAPILLARY: 94 mg/dL (ref 65–99)
Glucose-Capillary: 138 mg/dL — ABNORMAL HIGH (ref 65–99)

## 2014-10-02 LAB — URINALYSIS, ROUTINE W REFLEX MICROSCOPIC
BILIRUBIN URINE: NEGATIVE
Glucose, UA: NEGATIVE mg/dL
Hgb urine dipstick: NEGATIVE
Ketones, ur: NEGATIVE mg/dL
LEUKOCYTES UA: NEGATIVE
Nitrite: NEGATIVE
Protein, ur: NEGATIVE mg/dL
Specific Gravity, Urine: 1.01 (ref 1.005–1.030)
UROBILINOGEN UA: 1 mg/dL (ref 0.0–1.0)
pH: 6 (ref 5.0–8.0)

## 2014-10-02 LAB — TSH: TSH: 10.44 u[IU]/mL — ABNORMAL HIGH (ref 0.350–4.500)

## 2014-10-02 LAB — PROTIME-INR
INR: 0.99 (ref 0.00–1.49)
Prothrombin Time: 13.2 seconds (ref 11.6–15.2)

## 2014-10-02 LAB — T4, FREE: Free T4: 0.65 ng/dL (ref 0.61–1.12)

## 2014-10-02 MED ORDER — SODIUM CHLORIDE 0.9 % IV SOLN
INTRAVENOUS | Status: DC
Start: 2014-10-02 — End: 2014-10-03
  Administered 2014-10-02 – 2014-10-03 (×3): via INTRAVENOUS

## 2014-10-02 MED ORDER — ASPIRIN EC 81 MG PO TBEC
81.0000 mg | DELAYED_RELEASE_TABLET | Freq: Every day | ORAL | Status: DC
  Administered 2014-10-02 – 2014-10-03 (×2): 81 mg via ORAL
  Filled 2014-10-02 (×2): qty 1

## 2014-10-02 MED ORDER — ONDANSETRON HCL 4 MG PO TABS: 4.0000 mg | ORAL_TABLET | Freq: Four times a day (QID) | ORAL | Status: DC | PRN

## 2014-10-02 MED ORDER — SODIUM CHLORIDE 0.9 % IV BOLUS (SEPSIS)
500.0000 mL | Freq: Once | INTRAVENOUS | Status: AC
  Administered 2014-10-02: 500 mL via INTRAVENOUS

## 2014-10-02 MED ORDER — HEPARIN SODIUM (PORCINE) 5000 UNIT/ML IJ SOLN
5000.0000 [IU] | Freq: Three times a day (TID) | INTRAMUSCULAR | Status: DC
  Administered 2014-10-02 – 2014-10-03 (×4): 5000 [IU] via SUBCUTANEOUS
  Filled 2014-10-02 (×7): qty 1

## 2014-10-02 MED ORDER — INSULIN ASPART 100 UNIT/ML ~~LOC~~ SOLN: 0.0000 [IU] | Freq: Every day | SUBCUTANEOUS | Status: DC

## 2014-10-02 MED ORDER — OLANZAPINE 10 MG PO TABS
10.0000 mg | ORAL_TABLET | ORAL | Status: DC
  Administered 2014-10-02 – 2014-10-03 (×2): 10 mg via ORAL
  Filled 2014-10-02 (×2): qty 1

## 2014-10-02 MED ORDER — ACETAMINOPHEN 650 MG RE SUPP: 650.0000 mg | Freq: Four times a day (QID) | RECTAL | Status: DC | PRN

## 2014-10-02 MED ORDER — LISINOPRIL 20 MG PO TABS
40.0000 mg | ORAL_TABLET | Freq: Every day | ORAL | Status: DC
  Administered 2014-10-02 – 2014-10-03 (×2): 40 mg via ORAL
  Filled 2014-10-02 (×2): qty 2

## 2014-10-02 MED ORDER — ONDANSETRON HCL 4 MG/2ML IJ SOLN: 4.0000 mg | Freq: Four times a day (QID) | INTRAMUSCULAR | Status: DC | PRN

## 2014-10-02 MED ORDER — SODIUM CHLORIDE 0.9 % IJ SOLN: 3.0000 mL | Freq: Two times a day (BID) | INTRAMUSCULAR | Status: DC

## 2014-10-02 MED ORDER — AMLODIPINE BESYLATE 10 MG PO TABS
10.0000 mg | ORAL_TABLET | Freq: Every day | ORAL | Status: DC
  Administered 2014-10-02 – 2014-10-03 (×2): 10 mg via ORAL
  Filled 2014-10-02 (×2): qty 1

## 2014-10-02 MED ORDER — ACETAMINOPHEN 325 MG PO TABS: 650.0000 mg | ORAL_TABLET | Freq: Four times a day (QID) | ORAL | Status: DC | PRN

## 2014-10-02 MED ORDER — SODIUM CHLORIDE 0.9 % IV SOLN
INTRAVENOUS | Status: DC
  Administered 2014-10-02: 01:00:00 via INTRAVENOUS

## 2014-10-02 MED ORDER — INSULIN ASPART 100 UNIT/ML ~~LOC~~ SOLN
0.0000 [IU] | Freq: Three times a day (TID) | SUBCUTANEOUS | Status: DC
  Administered 2014-10-02 – 2014-10-03 (×2): 3 [IU] via SUBCUTANEOUS

## 2014-10-02 MED ORDER — OLANZAPINE 10 MG PO TABS
20.0000 mg | ORAL_TABLET | Freq: Every day | ORAL | Status: DC
  Administered 2014-10-02: 20 mg via ORAL
  Filled 2014-10-02 (×2): qty 2

## 2014-10-02 MED ORDER — CLONIDINE HCL 0.1 MG PO TABS
0.1000 mg | ORAL_TABLET | Freq: Two times a day (BID) | ORAL | Status: DC
  Administered 2014-10-02 – 2014-10-03 (×3): 0.1 mg via ORAL
  Filled 2014-10-02 (×4): qty 1

## 2014-10-02 NOTE — Evaluation (Signed)
Physical Therapy Evaluation Patient Details Name: Mitchell [name] MRN: [id number] DOB: 07-27-1960 Today's Date: 10/02/2014   History of Present Illness  Mitchell [name] is a 54 y.o. male with Past medical history of hypertension, diabetes mellitus, schizoaffective disorder, positive sleep study, CVA. admitted 10/02/14 with shaking and confusion. increased level of lithium.  Clinical Impression  Patient is ambulatory  But relates that he does not "walk Like this". Pat at times has a mildly festinating gait. PATIENT WILL benefit from PT while in acute care to address problems listed in note below.     No PT follow up    Equipment Recommendations  None recommended by PT    Recommendations for Other Services       Precautions / Restrictions Precautions Precautions: Fall Restrictions Weight Bearing Restrictions: No      Mobility  Bed Mobility Overal bed mobility: Independent                Transfers Overall transfer level: Independent                  Ambulation/Gait Ambulation/Gait assistance: Supervision Ambulation Distance (Feet): 220 Feet Assistive device: None Gait Pattern/deviations: Decreased stride length;Shuffle Gait velocity: patient states that his gait is not normal, states he is shuffling more, patient did not require any UE support.      Stairs            Wheelchair Mobility    Modified Rankin (Stroke Patients Only)       Balance Overall balance assessment: Needs assistance         Standing balance support: During functional activity;No upper extremity supported Standing balance-Leahy Scale: Fair                               Pertinent Vitals/Pain Pain Assessment: No/denies pain    Home Living Family/patient expects to be discharged to:: Group home                      Prior Function Level of Independence: Independent               Hand Dominance        Extremity/Trunk Assessment   Upper  Extremity Assessment: Overall WFL for tasks assessed           Lower Extremity Assessment: Generalized weakness         Communication   Communication: No difficulties  Cognition Arousal/Alertness: Awake/alert Behavior During Therapy: Flat affect Overall Cognitive Status: Within Functional Limits for tasks assessed                      General Comments      Exercises        Assessment/Plan    PT Assessment Patient needs continued PT services  PT Diagnosis Generalized weakness   PT Problem List Decreased strength;Decreased mobility  PT Treatment Interventions Gait training   PT Goals (Current goals can be found in the Care Plan section) Acute Rehab PT Goals Patient Stated Goal: to walk better PT Goal Formulation: With patient Time For Goal Achievement: 10/16/14 Potential to Achieve Goals: Good    Frequency Min 3X/week   Barriers to discharge        Co-evaluation               End of Session   Activity Tolerance: Patient tolerated treatment well Patient left: in bed;with call  bell/phone within reach;with nursing/sitter in room Nurse Communication: Mobility status         Time: 1400-1415 PT Time Calculation (min) (ACUTE ONLY): 15 min   Charges:   PT Evaluation $Initial PT Evaluation Tier I: 1 Procedure     PT G CodesRada [name] 10/02/2014, 2:21 PM  Mitchell [name] PT [phone]

## 2014-10-02 NOTE — H&P (Signed)
[facility] History and Physical  Patient: Mitchell [name]  MRN: [id number]  DOB: 1960-11-27  DOS: the patient was seen and examined on 10/02/2014 PCP: Dorrene German, MD  Referring physician: Dr. Elesa Massed Chief Complaint: Tremors  HPI: Mitchell [name] is a 54 y.o. male with Past medical history of hypertension, diabetes mellitus, schizoaffective disorder, positive sleep study, CVA. The patient is presenting with complaints of confusion as well as tremors. The history was taken from ED documentation since the patient is unable to provide any reliable significant history. Patient is from group home [facility]. Patient mentions he started having shaking today and the shaking continues to present and was making it difficult for him to ambulate. He denies any new focal deficits. He denies any dizziness or lightheadedness vision changes. He denies any diarrhea or constipation or abdominal pain. And denies any burning urination. Patient is unable to specify to me if that is any new medication but he told the ER physician that the lithium was new medication.  The patient is coming from home.  At his baseline ambulates without any support And is independent for most of his ADL manages her medication on his own.  Review of Systems: as mentioned in the history of present illness.  A comprehensive review of the other systems is negative.  Past Medical History  Diagnosis Date  . Hyponatremia     history of  . Hypertension     takes Amlodipine,Lisinopril and Clonidine daily  . Seasonal allergies     takes Claritin daily  . Diabetes mellitus     takes Victoza,Metformin,and Glipizide daily  . Mental disorder     takes Lithium daily  . Bipolar affective disorder     takes Zyprexa daily  . Schizoaffective disorder     takes Trazodone nightly  . Sleep apnea     sleep study >74yrs ago  . Stroke     left arm weakness   Past Surgical History  Procedure Laterality Date  .  Circumcision  20 yrs. ago  . Cataract extraction w/phaco Right 02/14/2013    Procedure: CATARACT EXTRACTION PHACO AND INTRAOCULAR LENS PLACEMENT (IOC);  Surgeon: Shade Flood, MD;  Location: [facility] OR;  Service: Ophthalmology;  Laterality: Right;  . Eye surgery    . Cataract extraction w/phaco Left 06/13/2013    Procedure: CATARACT EXTRACTION PHACO AND INTRAOCULAR LENS PLACEMENT (IOC);  Surgeon: Shade Flood, MD;  Location: [facility] OR;  Service: Ophthalmology;  Laterality: Left;   Social History:  reports that he has never smoked. He has never used smokeless tobacco. He reports that he does not drink alcohol or use illicit drugs.  No Known Allergies  No family history on file.  Prior to Admission medications   Medication Sig Start Date End Date Taking? Authorizing Provider  amLODipine (NORVASC) 10 MG tablet Take 10 mg by mouth daily.   Yes Historical Provider, MD  aspirin EC 81 MG tablet Take 81 mg by mouth daily.   Yes Historical Provider, MD  cloNIDine (CATAPRES) 0.1 MG tablet Take 0.1 mg by mouth 2 (two) times daily. 02/17/11  Yes Viviann Spare, FNP  glipiZIDE (GLUCOTROL XL) 10 MG 24 hr tablet Take 10 mg by mouth daily.   Yes Historical Provider, MD  hydrochlorothiazide (MICROZIDE) 12.5 MG capsule Take 12.5 mg by mouth daily.   Yes Historical Provider, MD  ipratropium (ATROVENT) 0.06 % nasal spray Place 2 sprays into both nostrils 4 (four) times daily. 03/25/14  Yes Rodolph Bong, MD  Liraglutide (VICTOZA) 18 MG/3ML SOLN injection Inject 0.6 mg into the skin daily.   Yes Historical Provider, MD  lisinopril (PRINIVIL,ZESTRIL) 40 MG tablet Take 40 mg by mouth daily.   Yes Historical Provider, MD  lithium 300 MG tablet Take 600 mg by mouth every morning.    Yes Historical Provider, MD  lithium carbonate (ESKALITH) 450 MG CR tablet Take 450 mg by mouth at bedtime.   Yes Historical Provider, MD  loratadine (CLARITIN) 10 MG tablet Take 10 mg by mouth daily.   Yes Historical Provider, MD  metFORMIN  (GLUCOPHAGE) 1000 MG tablet Take 1,000 mg by mouth 2 (two) times daily with a meal.   Yes Historical Provider, MD  OLANZapine (ZYPREXA) 10 MG tablet Take 10 mg by mouth every morning.   Yes Historical Provider, MD  OLANZapine (ZYPREXA) 20 MG tablet Take 20 mg by mouth at bedtime.   Yes Historical Provider, MD  polyethylene glycol (MIRALAX / GLYCOLAX) packet Take 17 g by mouth daily.   Yes Historical Provider, MD  traMADol (ULTRAM) 50 MG tablet Take 1 tablet (50 mg total) by mouth at bedtime as needed (cough). 03/25/14  Yes Rodolph BongEvan S Corey, MD  traZODone (DESYREL) 50 MG tablet Take 50 mg by mouth at bedtime as needed for sleep.    Yes Historical Provider, MD  acetaminophen (TYLENOL) 500 MG tablet Take 2 tablets (1,000 mg total) by mouth every 6 (six) hours as needed for mild pain or fever. 03/25/14   Rodolph BongEvan S Corey, MD  dextromethorphan-guaiFENesin [facility](MUCINEX DM) 30-600 MG per 12 hr tablet Take 1 tablet by mouth 2 (two) times daily. 07/27/14   Felicie Mornavid Smith, NP  dextromethorphan-guaiFENesin [facility](MUCINEX DM) 30-600 MG per 12 hr tablet Take 1 tablet by mouth 2 (two) times daily. Patient not taking: Reported on 10/02/2014 07/27/14   Felicie Mornavid Smith, NP    Physical Exam: Filed Vitals:   10/01/14 2256 10/02/14 0116 10/02/14 0218 10/02/14 0534  BP: 139/82 118/73 135/76 115/74  Pulse: 80 73 73 73  Temp: 98.5 F (36.9 C)  98.2 F (36.8 C) 97.4 F (36.3 C)  TempSrc: Oral  Oral Oral  Resp: 16 16 18 20   SpO2: 94% 97% 98% 96%    General: Alert, Awake and Oriented to Time, Place and Person. Appear in mild distress Eyes: PERRL ENT: Oral Mucosa clear moist. Neck: no JVD Cardiovascular: S1 and S2 Present, no Murmur, Peripheral Pulses Present Respiratory: Bilateral Air entry equal and Decreased,  Clear to Auscultation, present Crackles, non wheezes Abdomen: Bowel Sound no, Soft and no tender Skin: no Rash Extremities: Bilateral swelling leg Neurologic: Grossly no focal neuro deficit other than tremors  Labs on  Admission:  CBC:  Recent Labs Lab 10/01/14 2357  WBC 6.7  NEUTROABS 3.7  HGB 11.9*  HCT 35.0*  MCV 91.6  PLT 213    CMP     Component Value Date/Time   NA 137 10/01/2014 2357   K 3.9 10/01/2014 2357   CL 102 10/01/2014 2357   CO2 28 10/01/2014 2357   GLUCOSE 97 10/01/2014 2357   BUN 17 10/01/2014 2357   CREATININE 1.36* 10/01/2014 2357   CALCIUM 9.7 10/01/2014 2357   PROT 7.2 10/01/2014 2357   ALBUMIN 4.1 10/01/2014 2357   AST 22 10/01/2014 2357   ALT 15* 10/01/2014 2357   ALKPHOS 132* 10/01/2014 2357   BILITOT 0.4 10/01/2014 2357   GFRNONAA 58* 10/01/2014 2357   GFRAA >60 10/01/2014 2357    No results for input(s): LIPASE, AMYLASE  in the last 168 hours.  No results for input(s): CKTOTAL, CKMB, CKMBINDEX, TROPONINI in the last 168 hours. BNP (last 3 results) No results for input(s): BNP in the last 8760 hours.  ProBNP (last 3 results) No results for input(s): PROBNP in the last 8760 hours.   Radiological Exams on Admission: Ct Head Wo Contrast  10/02/2014   CLINICAL DATA:  Acute onset of shaking.  Initial encounter.  EXAM: CT HEAD WITHOUT CONTRAST  TECHNIQUE: Contiguous axial images were obtained from the base of the skull through the vertex without intravenous contrast.  COMPARISON:  CT of the head performed 02/08/2011  FINDINGS: There is no evidence of acute infarction, mass lesion, or intra- or extra-axial hemorrhage on CT.  Scattered periventricular and subcortical white matter change likely reflects mild small vessel ischemic microangiopathy.  The posterior fossa, including the cerebellum, brainstem and fourth ventricle, is within normal limits. The third and lateral ventricles, and basal ganglia are unremarkable in appearance. The cerebral hemispheres are symmetric in appearance, with normal gray-white differentiation. No mass effect or midline shift is seen.  There is no evidence of fracture; visualized osseous structures are unremarkable in appearance. The orbits  are within normal limits. The paranasal sinuses and mastoid air cells are well-aerated. No significant soft tissue abnormalities are seen.  IMPRESSION: 1. No acute intracranial pathology seen on CT. 2. Mild small vessel ischemic microangiopathy.   Electronically Signed   By: Roanna Raider M.D.   On: 10/02/2014 01:03   Assessment/Plan Principal Problem:   Lithium toxicity Active Problems:   Schizoaffective disorder, bipolar type   Coarse tremors   Hypertension   Type 2 diabetes mellitus   Sleep apnea   1. Lithium toxicity  The patient is presenting with complaints of tremors off the whole body. Neurological presentation is more likely consider associated with chronic lithium toxicity. Lithium levels are elevated. We will give him IV hydration and recheck usually with again in the morning. Since we are unable to contact his group home tonight we need to verify with her lithium dose was changed or whether this is a new medication for the patient. Serial neuro checks with any worsening get a CT scan of the head.   2. hypertension. Continuing home medications.  3. diabetes mellitus type 2. Holding oral medication placing him on sliding scale.  4. schizoaffective bipolar disorder. The patient has been on other mood stabilizer including valproic acid as well as Tegretol in the past. Currently continue with lithium once the level stabilizes.  Advance goals of care discussion: Full code presumed    DVT Prophylaxis: subcutaneous Heparin Nutrition: Advance diet as tolerated  Disposition: Admitted as inpatient, telemetry unit.  Author: Lynden Oxford, MD [facility] Pager: [phone] 10/02/2014  If 7PM-7AM, please contact night-coverage [email or web address] Password [email or web address]

## 2014-10-02 NOTE — Clinical Social Work Note (Signed)
Clinical Social Work Assessment  Patient Details  Name: Mitchell [name] MRN: [id number] Date of Birth: 12/25/1960  Date of referral:  10/02/14               Reason for consult:  Discharge Planning                Permission sought to share information with:  Facility Medical sales representativeContact Representative Permission granted to share information::  Yes, Verbal Permission Granted  Name::        Agency::  [facility]  Relationship::  Group home  Contact Information:  [phone]  Housing/Transportation Living arrangements for the past 2 months:  Assisted Living Facility Source of Information:  Patient, Other (Comment Required) Patient Interpreter Needed:  None Criminal Activity/Legal Involvement Pertinent to Current Situation/Hospitalization:  No - Comment as needed Significant Relationships:  Mental Health Provider Lives with:  Facility Resident Do you feel safe going back to the place where you live?  Yes Need for family participation in patient care:  No (Coment)  Care giving concerns:  [facility]   Social Worker assessment / plan:  CSW received referral due to patient being admitted from a group home. Patient unable to fully participate in assessment and gives unreliable information. CSW called and spoke with Mitchell [name] at group home.  Patient is resident at group home and can return at DC. Group home will need FL2. CSW completed FL2 and placed on chart. Group home only has records since 2013 but patient has been on Lithium since that time. Patient [name] and his last labs to check Lithium levels was in Feb. 2016. Patient is currently prescribed 600 mg of Lithium in the morning and 450 mg in the evening. Group home administers medications and patient is compliant with treatment.  CSW will continue to follow and will assist with DC planning when medically stable.  Employment status:  Disabled (Comment on  whether or not currently receiving Disability) Insurance information:  Teacher, English as a foreign languageManaged Medicare, [place] PT Recommendations:  Not assessed at this time Information / Referral to community resources:  Other (Comment Required)  Patient/Family's Response to care:  Patient to return to group home. Patient unable to fully participate at this time.  Patient/Family's Understanding of and Emotional Response to Diagnosis, Current Treatment, and Prognosis:  Group home is unsure how patient's Lithium levels became elevated. Group home reports that patient's stomach has been extended and he has complained of pain. Group home reports they are unsure if he has an obstruction. Group home is agreeable for patient to return and will continue to manage his medications.  Emotional Assessment Appearance:  Appears stated age Attitude/Demeanor/Rapport:  Other Affect (typically observed):  Appropriate Orientation:  Oriented to Self Alcohol / Substance use:  Not Applicable Psych involvement (Current and /or in the community):  No (Comment)  Discharge Needs  Concerns to be addressed:  No discharge needs identified Readmission within the last 30 days:  No Current discharge risk:  None Barriers to Discharge:  No Barriers Identified   Mitchell SpringGerber, [place] Marie, LCSW 10/02/2014, 11:39 AM [phone]

## 2014-10-02 NOTE — Progress Notes (Signed)
54 year old gentleman admitted for tremors secondary to lithium toxicity.  Stopped the lithium. His tremors have improved.  Please see H&P done earlier by Dr Allena KatzPatel.   Kathlen ModyVijaya Pavlos Yon, MD [phone]

## 2014-10-02 NOTE — ED Provider Notes (Addendum)
TIME SEEN: 12:30 AM  CHIEF COMPLAINT: Tremors  HPI: Pt is a 54 y.o. male with history of hypertension, diabetes, bipolar disorder, schizoaffective disorder who presents emergency department with jerking in bilateral upper and lower extremities that started today. [facility] He states that he thinks that they recently started him on new medications including his lithium. I am unable to confirm this. He denies any headache, fever, cough, vomiting or diarrhea. No numbness, tingling or focal weakness.  ROS: See HPI Constitutional: no fever  Eyes: no drainage  ENT: no runny nose   Cardiovascular:  no chest pain  Resp: no SOB  GI: no vomiting GU: no dysuria Integumentary: no rash  Allergy: no hives  Musculoskeletal: no leg swelling  Neurological: no slurred speech ROS otherwise negative  PAST MEDICAL HISTORY/PAST SURGICAL HISTORY:  Past Medical History  Diagnosis Date  . Hyponatremia     history of  . Hypertension     takes Amlodipine,Lisinopril and Clonidine daily  . Seasonal allergies     takes Claritin daily  . Diabetes mellitus     takes Victoza,Metformin,and Glipizide daily  . Mental disorder     takes Lithium daily  . Bipolar affective disorder     takes Zyprexa daily  . Schizoaffective disorder     takes Trazodone nightly  . Sleep apnea     sleep study >6439yrs ago  . Stroke     left arm weakness    MEDICATIONS:  Prior to Admission medications   Medication Sig Start Date End Date Taking? Authorizing Provider  acetaminophen (TYLENOL) 500 MG tablet Take 2 tablets (1,000 mg total) by mouth every 6 (six) hours as needed for mild pain or fever. 03/25/14   Rodolph BongEvan S Corey, MD  amLODipine (NORVASC) 10 MG tablet Take 10 mg by mouth daily.    Historical Provider, MD  aspirin EC 81 MG tablet Take 81 mg by mouth daily.    Historical Provider, MD  cloNIDine (CATAPRES) 0.1 MG tablet Take 0.1 mg by mouth 2 (two) times daily. 02/17/11   Viviann SpareMargaret A Scott, FNP   dextromethorphan-guaiFENesin [facility](MUCINEX DM) 30-600 MG per 12 hr tablet Take 1 tablet by mouth 2 (two) times daily. 07/27/14   Felicie Mornavid Smith, NP  dextromethorphan-guaiFENesin [facility](MUCINEX DM) 30-600 MG per 12 hr tablet Take 1 tablet by mouth 2 (two) times daily. 07/27/14   Felicie Mornavid Smith, NP  glipiZIDE (GLUCOTROL XL) 10 MG 24 hr tablet Take 10 mg by mouth daily.    Historical Provider, MD  ipratropium (ATROVENT) 0.06 % nasal spray Place 2 sprays into both nostrils 4 (four) times daily. 03/25/14   Rodolph BongEvan S Corey, MD  Liraglutide (VICTOZA) 18 MG/3ML SOLN injection Inject 0.6 mg into the skin daily.    Historical Provider, MD  lisinopril (PRINIVIL,ZESTRIL) 40 MG tablet Take 40 mg by mouth daily.    Historical Provider, MD  lithium 300 MG tablet Take 600 mg by mouth 2 (two) times daily.     Historical Provider, MD  loratadine (CLARITIN) 10 MG tablet Take 10 mg by mouth daily.    Historical Provider, MD  metFORMIN (GLUCOPHAGE) 1000 MG tablet Take 1,000 mg by mouth 2 (two) times daily with a meal.    Historical Provider, MD  OLANZapine (ZYPREXA) 20 MG tablet Take 20 mg by mouth at bedtime.    Historical Provider, MD  pantoprazole (PROTONIX) 40 MG tablet Take 40 mg by mouth daily.    Historical Provider, MD  polyethylene glycol (MIRALAX / GLYCOLAX)  packet Take 17 g by mouth daily.    Historical Provider, MD  Skin Protectants, Misc. (EUCERIN) cream Apply 1 application topically daily. To tops of feet    Historical Provider, MD  traMADol (ULTRAM) 50 MG tablet Take 1 tablet (50 mg total) by mouth at bedtime as needed (cough). 03/25/14   Rodolph Bong, MD  traZODone (DESYREL) 50 MG tablet Take 50 mg by mouth at bedtime as needed for sleep.     Historical Provider, MD    ALLERGIES:  No Known Allergies  SOCIAL HISTORY:  History  Substance Use Topics  . Smoking status: Never Smoker   . Smokeless tobacco: Never Used  . Alcohol Use: No    FAMILY HISTORY: No family history on file.  EXAM: BP 139/82 mmHg  Pulse 80   Temp(Src) 98.5 F (36.9 C) (Oral)  Resp 16  SpO2 94% CONSTITUTIONAL: Alert and oriented and responds appropriately to questions. Well-appearing; well-nourished, slow to answer questions and appears chronically ill but in no distress HEAD: Normocephalic EYES: Conjunctivae clear, PERRL ENT: normal nose; no rhinorrhea; moist mucous membranes; pharynx without lesions noted NECK: Supple, no meningismus, no LAD  CARD: RRR; S1 and S2 appreciated; no murmurs, no clicks, no rubs, no gallops RESP: Normal chest excursion without splinting or tachypnea; breath sounds clear and equal bilaterally; no wheezes, no rhonchi, no rales, no hypoxia or respiratory distress, speaking full sentences ABD/GI: Normal bowel sounds; non-distended; soft, non-tender, no rebound, no guarding, no peritoneal signs BACK:  The back appears normal and is non-tender to palpation, there is no CVA tenderness EXT: Normal ROM in all joints; non-tender to palpation; no edema; normal capillary refill; no cyanosis, no calf tenderness or swelling    SKIN: Normal color for age and race; warm NEURO: Moves all extremities equally, sensation to light touch intact diffusely, cranial nerves II through XII intact, patient does have a mild bilateral upper extremity resting tremor and intermittent jerking movements of his extremities but no seizure activity PSYCH: The patient's mood and manner are appropriate. Grooming and personal hygiene are appropriate.  MEDICAL DECISION MAKING: Patient here with jerking, tremors. Will obtain labs, urine, lithium level, head CT. No seizure-like activity. No other neurologic deficits. Afebrile.  ED PROGRESS: Patient's labs show elevated lithium level at 2.25. He also has mildly elevated creatinine but normal sodium. Given he has neurologic symptoms with this elevated lithium level I recommended admission for observation and IV hydration. Will discuss with hospitalist.   1:33 AM  D/w Dr. Allena Katz for admission to  medical bed, obs.    Layla Maw Ward, DO 10/02/14 0133     EKG Interpretation  Date/Time:  Wednesday October 02 2014 01:38:03 EDT Ventricular Rate:  71 PR Interval:  219 QRS Duration: 101 QT Interval:  406 QTC Calculation: 441 R Axis:   52 Text Interpretation:  Sinus rhythm Prolonged PR interval Low voltage, precordial leads Borderline T abnormalities, inferior leads No significant change since last tracing Confirmed by WARD,  DO, KRISTEN ([id number]) on 10/02/2014 1:41:22 AM        Layla Maw Ward, DO 10/02/14 6045

## 2014-10-03 DIAGNOSIS — T56894D Toxic effect of other metals, undetermined, subsequent encounter: Secondary | ICD-10-CM

## 2014-10-03 DIAGNOSIS — G252 Other specified forms of tremor: Secondary | ICD-10-CM

## 2014-10-03 LAB — BASIC METABOLIC PANEL
ANION GAP: 5 (ref 5–15)
BUN: 17 mg/dL (ref 6–20)
CALCIUM: 9.1 mg/dL (ref 8.9–10.3)
CHLORIDE: 108 mmol/L (ref 101–111)
CO2: 27 mmol/L (ref 22–32)
Creatinine, Ser: 1.26 mg/dL — ABNORMAL HIGH (ref 0.61–1.24)
GFR calc Af Amer: >60 mL/min (ref >60)
GFR calc non Af Amer: >60 mL/min (ref >60)
GLUCOSE: 145 mg/dL — AB (ref 65–99)
POTASSIUM: 4.1 mmol/L (ref 3.5–5.1)
SODIUM: 140 mmol/L (ref 135–145)

## 2014-10-03 LAB — LITHIUM LEVEL: Lithium Lvl: 1.17 mmol/L (ref 0.60–1.20)

## 2014-10-03 LAB — HEMOGLOBIN A1C
Hgb A1c MFr Bld: 6.1 % — ABNORMAL HIGH (ref 4.8–5.6)
Mean Plasma Glucose: 128 mg/dL

## 2014-10-03 LAB — GLUCOSE, CAPILLARY: Glucose-Capillary: 166 mg/dL — ABNORMAL HIGH (ref 65–99)

## 2014-10-03 MED ORDER — LITHIUM CARBONATE 300 MG PO TABS
450.0000 mg | ORAL_TABLET | ORAL | Status: DC
Start: 2014-10-03 — End: 2018-07-24

## 2014-10-03 MED ORDER — INSULIN ASPART 100 UNIT/ML ~~LOC~~ SOLN: 0.0000 [IU] | Freq: Three times a day (TID) | SUBCUTANEOUS | Status: DC

## 2014-10-03 NOTE — Discharge Summary (Signed)
Physician Discharge Summary  Mitchell [name]:[id number] DOB: June 22, 1960 DOA: 10/01/2014  PCP: Dorrene German, MD  Admit date: 10/01/2014 Discharge date: 10/03/2014  Time spent: 30 minutes  Recommendations for Outpatient Follow-up:  1. Recommend checking lithium level in 2 days.  2. Follow up with psychiatry in 3 days.   Discharge Diagnoses:  Principal Problem:   Lithium toxicity Active Problems:   Schizoaffective disorder, bipolar type   Coarse tremors   Hypertension   Type 2 diabetes mellitus   Sleep apnea   Discharge Condition: improved   Diet recommendation: carb modified diet.   Filed Weights   10/02/14 0534 10/03/14 0500  Weight: 101.334 kg (223 lb 6.4 oz) 111.177 kg (245 lb 1.6 oz)    History of present illness:  Mitchell [name] is a 54 y.o. male with Past medical history of hypertension, diabetes mellitus, schizoaffective disorder, positive sleep study, CVA  presented with complaints of confusion as well as tremors possibly from lithium toxicity.   Hospital Course:  Lithium toxicity  Neurological presentation is more likely consider associated with chronic lithium toxicity. Lithium levels are elevated. Provided IV hydration and his symptoms have improved and his lithium levels are less than 1.5. Changed the lithium dose from 600 mg to 450 mg BID.    2. hypertension. Continuing home medications except for hctz.   3. diabetes mellitus type 2. Resume home meds.   4. schizoaffective bipolar disorder. Resume home meds.   Procedures:  NONE  Consultations:  NONE  Discharge Exam: Filed Vitals:   10/03/14 0500  BP: 115/68  Pulse: 72  Temp: 97.6 F (36.4 C)  Resp: 20    General: alert afebrile comfortable Cardiovascular: s1s2 Respiratory: ctab  Discharge Instructions   Discharge Instructions    Diet - low sodium heart healthy    Complete by:  As directed      Discharge instructions    Complete by:  As directed   Follow up with Psychiatry as  recommended.          Current Discharge Medication List    START taking these medications   Details  insulin aspart (NOVOLOG) 100 UNIT/ML injection Inject 0-15 Units into the skin 3 (three) times daily with meals. Qty: 10 mL, Refills: 11      CONTINUE these medications which have CHANGED   Details  lithium 300 MG tablet Take 1.5 tablets (450 mg total) by mouth every morning.      CONTINUE these medications which have NOT CHANGED   Details  amLODipine (NORVASC) 10 MG tablet Take 10 mg by mouth daily.    aspirin EC 81 MG tablet Take 81 mg by mouth daily.    cloNIDine (CATAPRES) 0.1 MG tablet Take 0.1 mg by mouth 2 (two) times daily.    glipiZIDE (GLUCOTROL XL) 10 MG 24 hr tablet Take 10 mg by mouth daily.    ipratropium (ATROVENT) 0.06 % nasal spray Place 2 sprays into both nostrils 4 (four) times daily. Qty: 15 mL, Refills: 1    Liraglutide (VICTOZA) 18 MG/3ML SOLN injection Inject 0.6 mg into the skin daily.    lisinopril (PRINIVIL,ZESTRIL) 40 MG tablet Take 40 mg by mouth daily.    lithium carbonate (ESKALITH) 450 MG CR tablet Take 450 mg by mouth at bedtime.    loratadine (CLARITIN) 10 MG tablet Take 10 mg by mouth daily.    metFORMIN (GLUCOPHAGE) 1000 MG tablet Take 1,000 mg by mouth 2 (two) times daily with a meal.    !!  OLANZapine (ZYPREXA) 10 MG tablet Take 10 mg by mouth every morning.    !! OLANZapine (ZYPREXA) 20 MG tablet Take 20 mg by mouth at bedtime.    polyethylene glycol (MIRALAX / GLYCOLAX) packet Take 17 g by mouth daily.    traZODone (DESYREL) 50 MG tablet Take 50 mg by mouth at bedtime.     acetaminophen (TYLENOL) 500 MG tablet Take 2 tablets (1,000 mg total) by mouth every 6 (six) hours as needed for mild pain or fever. Qty: 90 tablet, Refills: 1     !! - Potential duplicate medications found. Please discuss with provider.    STOP taking these medications     hydrochlorothiazide (MICROZIDE) 12.5 MG capsule      traMADol (ULTRAM) 50 MG  tablet      dextromethorphan-guaiFENesin (MUCINEX DM) 30-600 MG per 12 hr tablet      dextromethorphan-guaiFENesin (MUCINEX DM) 30-600 MG per 12 hr tablet        No Known Allergies Follow-up Information    Follow up with AVBUERE,EDWIN A, MD. Schedule an appointment as soon as possible for a visit in 1 week.   Specialty:  Internal Medicine   Contact information:   [address] [place] [id number] [phone]        The results of significant diagnostics from this hospitalization (including imaging, microbiology, ancillary and laboratory) are listed below for reference.    Significant Diagnostic Studies: Ct Head Wo Contrast  10/02/2014   CLINICAL DATA:  Acute onset of shaking.  Initial encounter.  EXAM: CT HEAD WITHOUT CONTRAST  TECHNIQUE: Contiguous axial images were obtained from the base of the skull through the vertex without intravenous contrast.  COMPARISON:  CT of the head performed 02/08/2011  FINDINGS: There is no evidence of acute infarction, mass lesion, or intra- or extra-axial hemorrhage on CT.  Scattered periventricular and subcortical white matter change likely reflects mild small vessel ischemic microangiopathy.  The posterior fossa, including the cerebellum, brainstem and fourth ventricle, is within normal limits. The third and lateral ventricles, and basal ganglia are unremarkable in appearance. The cerebral hemispheres are symmetric in appearance, with normal gray-white differentiation. No mass effect or midline shift is seen.  There is no evidence of fracture; visualized osseous structures are unremarkable in appearance. The orbits are within normal limits. The paranasal sinuses and mastoid air cells are well-aerated. No significant soft tissue abnormalities are seen.  IMPRESSION: 1. No acute intracranial pathology seen on CT. 2. Mild small vessel ischemic microangiopathy.   Electronically Signed   By: Roanna RaiderJeffery  Chang M.D.   On: 10/02/2014 01:03    Microbiology: No  results found for this or any previous visit (from the past 240 hour(s)).   Labs: Basic Metabolic Panel:  Recent Labs Lab 10/01/14 2357 10/02/14 0530 10/03/14 0457  NA 137 138 140  K 3.9 3.9 4.1  CL 102 104 108  CO2 28 28 27   GLUCOSE 97 121* 145*  BUN 17 17 17   CREATININE 1.36* 1.29* 1.26*  CALCIUM 9.7 9.6 9.1   Liver Function Tests:  Recent Labs Lab 10/01/14 2357 10/02/14 0530  AST 22 19  ALT 15* 14*  ALKPHOS 132* 124  BILITOT 0.4 0.3  PROT 7.2 6.9  ALBUMIN 4.1 3.8   No results for input(s): LIPASE, AMYLASE in the last 168 hours. No results for input(s): AMMONIA in the last 168 hours. CBC:  Recent Labs Lab 10/01/14 2357 10/02/14 0530  WBC 6.7 6.2  NEUTROABS 3.7  --   HGB  11.9* 12.4*  HCT 35.0* 36.8*  MCV 91.6 92.9  PLT 213 206   Cardiac Enzymes: No results for input(s): CKTOTAL, CKMB, CKMBINDEX, TROPONINI in the last 168 hours. BNP: BNP (last 3 results) No results for input(s): BNP in the last 8760 hours.  ProBNP (last 3 results) No results for input(s): PROBNP in the last 8760 hours.  CBG:  Recent Labs Lab 10/02/14 0805 10/02/14 1207 10/02/14 1641 10/02/14 2212 10/03/14 1156  GLUCAP 118* 157* 94 138* 166*       Signed:  Nayib Remer  [facility] 10/03/2014, 1:27 PM

## 2014-10-03 NOTE — Progress Notes (Signed)
Clinical Social Work  CSW spoke with Lattie Haw at group home who is aware of DC plans and agreeable to accept patient. Group home does not feel safe transporting patient and requests [facility] bring patient back to group home. CSW met with patient and explained DC plans. Patient happy to be DC and asked CSW update his girlfriend Tawanna Sat [phone] of DC plans. CSW prepared DC packet with FL2 and DC summary.   PTAR arranged: request #: [id number].  CSW is signing off but available if needed.  [place], [place] [phone]

## 2014-10-09 ENCOUNTER — Ambulatory Visit (HOSPITAL_BASED_OUTPATIENT_CLINIC_OR_DEPARTMENT_OTHER): Payer: Medicare Other | Attending: Internal Medicine

## 2014-10-09 VITALS — Ht 65.0 in | Wt 240.0 lb

## 2014-10-09 DIAGNOSIS — G4736 Sleep related hypoventilation in conditions classified elsewhere: Secondary | ICD-10-CM | POA: Insufficient documentation

## 2014-10-09 DIAGNOSIS — R0683 Snoring: Secondary | ICD-10-CM | POA: Insufficient documentation

## 2014-10-09 DIAGNOSIS — G4733 Obstructive sleep apnea (adult) (pediatric): Secondary | ICD-10-CM | POA: Insufficient documentation

## 2014-10-09 DIAGNOSIS — G4700 Insomnia, unspecified: Secondary | ICD-10-CM | POA: Diagnosis present

## 2014-10-13 ENCOUNTER — Ambulatory Visit (HOSPITAL_BASED_OUTPATIENT_CLINIC_OR_DEPARTMENT_OTHER): Payer: Medicare Other | Admitting: Internal Medicine

## 2014-10-13 DIAGNOSIS — G4733 Obstructive sleep apnea (adult) (pediatric): Secondary | ICD-10-CM

## 2014-10-13 NOTE — Progress Notes (Signed)
   Patient Name: Mitchell [name], Mitchell [name] Date: 10/09/2014 Gender: Male D.O.B: [birth year] Age (years): [age] Referring Provider: Ramond CraverAvbeure, MD Height (inches): 65 Interpreting Physician: Jetty Duhamellinton Young MD, ABSM Weight (lbs): 240 RPSGT: Melburn PopperWillard, Susan BMI: 40 MRN: [id number] Neck Size: 18.00 CLINICAL INFORMATION Sleep [name] Type: NPSG, did not have enough events to meet protocol requirements for CPAP titration on this [name].  Indication for sleep [name]: Insomnia with sleep apnea  Epworth Sleepiness Score: 20  SLEEP [name] TECHNIQUE As per the AASM Manual for the Scoring of Sleep and Associated Events v2.3 (April 2016) with a hypopnea requiring 4% desaturations.  The channels recorded and monitored were frontal, central and occipital EEG, electrooculogram (EOG), submentalis EMG (chin), nasal and oral airflow, thoracic and abdominal wall motion, anterior tibialis EMG, snore microphone, electrocardiogram, and pulse oximetry.  MEDICATIONS Patient's medications include: Charted for review. Medications self-administered by patient during sleep [name] : No sleep medicine administered.  SLEEP ARCHITECTURE The [name] was initiated at 10:36:07 PM and ended at 5:30:28 AM.  Sleep onset time was 0.0 minutes and the sleep efficiency was 97.7%. The total sleep time was 404.9 minutes.  Stage REM latency was 113.4 minutes.  The patient spent 2.10% of the night in stage N1 sleep, 66.28% in stage N2 sleep, 0.00% in stage N3 and 31.62% in REM.  Alpha intrusion was absent.  Supine sleep was 76.87%.  Awake after sleep onset 9.5 minutes  RESPIRATORY PARAMETERS The overall apnea/hypopnea index (AHI) was 13.9 per hour. There were 54 total apneas, including 52 obstructive, 2 central and 0 mixed apneas. There were 40 hypopneas and 2 RERAs.  The AHI during Stage REM sleep was 35.2 per hour.  AHI while supine was 11.6 per hour.  The mean oxygen saturation was 92.50%. The minimum SpO2 during sleep was  65.00% on room air.  Loud snoring was noted during this [name].  CARDIAC DATA The 2 lead EKG demonstrated sinus rhythm. The mean heart rate was 73.14 beats per minute. Other EKG findings include: None.  LEG MOVEMENT DATA The total PLMS were 0 with a resulting PLMS index of 0.00. Associated arousal with leg movement index was 0.0 .  IMPRESSIONS Mild obstructive sleep apnea occurred during this [name] (AHI = 13.9/h). No significant central sleep apnea occurred during this [name] (CAI = 0.3/h). Severe oxygen desaturation was noted during this [name] (Min O2 = 65.00%), with mean saturation 92.5% on room air. The patient snored with moderate to loud snoring volume. No cardiac abnormalities were noted during this [name]. Clinically significant periodic limb movements did not occur during sleep. No significant associated arousals. The patient's self-reported sleep complaint on arrival was difficulty falling asleep, lots of coughing, sometimes sleep better sitting up.  DIAGNOSIS Obstructive Sleep Apnea (327.23 [G47.33 ICD-10]) Nocturnal Hypoxemia (327.26 [G47.36 ICD-10])  RECOMMENDATIONS Therapeutic CPAP titration to determine optimal pressure required to alleviate sleep disordered breathing. Consider patient complaint of "lots of coughing" (not observed on this [name]) as possibly indicating lung disease or reflux. Avoid alcohol, sedatives and other CNS depressants that may worsen sleep apnea and disrupt normal sleep architecture. Sleep hygiene should be reviewed to assess factors that may improve sleep quality. Weight management and regular exercise should be initiated or continued if appropriate.  Waymon BudgeYOUNG,CLINTON D Diplomate, American Board of Sleep Medicine  ELECTRONICALLY SIGNED ON:  10/13/2014, 2:16 PM [facility] PH: [phone]   FX: [phone]) [phone] ACCREDITED BY THE AMERICAN ACADEMY OF SLEEP MEDICINE

## 2014-10-30 DIAGNOSIS — E119 Type 2 diabetes mellitus without complications: Secondary | ICD-10-CM | POA: Diagnosis not present

## 2014-12-04 DIAGNOSIS — E119 Type 2 diabetes mellitus without complications: Secondary | ICD-10-CM | POA: Diagnosis not present

## 2014-12-09 DIAGNOSIS — L602 Onychogryphosis: Secondary | ICD-10-CM | POA: Diagnosis not present

## 2014-12-09 DIAGNOSIS — E1151 Type 2 diabetes mellitus with diabetic peripheral angiopathy without gangrene: Secondary | ICD-10-CM | POA: Diagnosis not present

## 2014-12-09 DIAGNOSIS — L84 Corns and callosities: Secondary | ICD-10-CM | POA: Diagnosis not present

## 2014-12-25 DIAGNOSIS — H5703 Miosis: Secondary | ICD-10-CM | POA: Diagnosis not present

## 2014-12-25 DIAGNOSIS — Z961 Presence of intraocular lens: Secondary | ICD-10-CM | POA: Diagnosis not present

## 2014-12-25 DIAGNOSIS — E1139 Type 2 diabetes mellitus with other diabetic ophthalmic complication: Secondary | ICD-10-CM | POA: Diagnosis not present

## 2014-12-25 DIAGNOSIS — I708 Atherosclerosis of other arteries: Secondary | ICD-10-CM | POA: Diagnosis not present

## 2014-12-25 DIAGNOSIS — E11319 Type 2 diabetes mellitus with unspecified diabetic retinopathy without macular edema: Secondary | ICD-10-CM | POA: Diagnosis not present

## 2015-01-07 DIAGNOSIS — J302 Other seasonal allergic rhinitis: Secondary | ICD-10-CM | POA: Diagnosis not present

## 2015-01-07 DIAGNOSIS — I10 Essential (primary) hypertension: Secondary | ICD-10-CM | POA: Diagnosis not present

## 2015-01-07 DIAGNOSIS — E669 Obesity, unspecified: Secondary | ICD-10-CM | POA: Diagnosis not present

## 2015-01-07 DIAGNOSIS — G4730 Sleep apnea, unspecified: Secondary | ICD-10-CM | POA: Diagnosis not present

## 2015-01-07 DIAGNOSIS — Z23 Encounter for immunization: Secondary | ICD-10-CM | POA: Diagnosis not present

## 2015-01-07 DIAGNOSIS — E119 Type 2 diabetes mellitus without complications: Secondary | ICD-10-CM | POA: Diagnosis not present

## 2015-01-08 DIAGNOSIS — Z79899 Other long term (current) drug therapy: Secondary | ICD-10-CM | POA: Diagnosis not present

## 2015-01-08 DIAGNOSIS — F209 Schizophrenia, unspecified: Secondary | ICD-10-CM | POA: Diagnosis not present

## 2015-01-09 DIAGNOSIS — E119 Type 2 diabetes mellitus without complications: Secondary | ICD-10-CM | POA: Diagnosis not present

## 2015-01-16 ENCOUNTER — Encounter (INDEPENDENT_AMBULATORY_CARE_PROVIDER_SITE_OTHER): Payer: Self-pay | Admitting: Ophthalmology

## 2015-01-16 ENCOUNTER — Encounter (INDEPENDENT_AMBULATORY_CARE_PROVIDER_SITE_OTHER): Payer: Medicare Other | Admitting: Ophthalmology

## 2015-01-16 DIAGNOSIS — H35033 Hypertensive retinopathy, bilateral: Secondary | ICD-10-CM

## 2015-01-16 DIAGNOSIS — E11311 Type 2 diabetes mellitus with unspecified diabetic retinopathy with macular edema: Secondary | ICD-10-CM

## 2015-01-16 DIAGNOSIS — E113291 Type 2 diabetes mellitus with mild nonproliferative diabetic retinopathy without macular edema, right eye: Secondary | ICD-10-CM

## 2015-01-16 DIAGNOSIS — E113312 Type 2 diabetes mellitus with moderate nonproliferative diabetic retinopathy with macular edema, left eye: Secondary | ICD-10-CM

## 2015-01-16 DIAGNOSIS — I10 Essential (primary) hypertension: Secondary | ICD-10-CM | POA: Diagnosis not present

## 2015-01-16 DIAGNOSIS — H43813 Vitreous degeneration, bilateral: Secondary | ICD-10-CM | POA: Diagnosis not present

## 2015-02-10 DIAGNOSIS — L84 Corns and callosities: Secondary | ICD-10-CM | POA: Diagnosis not present

## 2015-02-10 DIAGNOSIS — E1351 Other specified diabetes mellitus with diabetic peripheral angiopathy without gangrene: Secondary | ICD-10-CM | POA: Diagnosis not present

## 2015-02-10 DIAGNOSIS — L602 Onychogryphosis: Secondary | ICD-10-CM | POA: Diagnosis not present

## 2015-02-11 DIAGNOSIS — F259 Schizoaffective disorder, unspecified: Secondary | ICD-10-CM | POA: Diagnosis not present

## 2015-02-11 DIAGNOSIS — J302 Other seasonal allergic rhinitis: Secondary | ICD-10-CM | POA: Diagnosis not present

## 2015-02-11 DIAGNOSIS — E1165 Type 2 diabetes mellitus with hyperglycemia: Secondary | ICD-10-CM | POA: Diagnosis not present

## 2015-02-11 DIAGNOSIS — I10 Essential (primary) hypertension: Secondary | ICD-10-CM | POA: Diagnosis not present

## 2015-02-27 DIAGNOSIS — E119 Type 2 diabetes mellitus without complications: Secondary | ICD-10-CM | POA: Diagnosis not present

## 2015-03-10 DIAGNOSIS — E119 Type 2 diabetes mellitus without complications: Secondary | ICD-10-CM | POA: Diagnosis not present

## 2015-07-17 ENCOUNTER — Ambulatory Visit (INDEPENDENT_AMBULATORY_CARE_PROVIDER_SITE_OTHER): Payer: Medicare Other | Admitting: Ophthalmology

## 2015-07-17 DIAGNOSIS — E113291 Type 2 diabetes mellitus with mild nonproliferative diabetic retinopathy without macular edema, right eye: Secondary | ICD-10-CM

## 2015-07-17 DIAGNOSIS — E11319 Type 2 diabetes mellitus with unspecified diabetic retinopathy without macular edema: Secondary | ICD-10-CM | POA: Diagnosis not present

## 2015-07-17 DIAGNOSIS — I10 Essential (primary) hypertension: Secondary | ICD-10-CM | POA: Diagnosis not present

## 2015-07-17 DIAGNOSIS — E113391 Type 2 diabetes mellitus with moderate nonproliferative diabetic retinopathy without macular edema, right eye: Secondary | ICD-10-CM

## 2015-07-17 DIAGNOSIS — H43813 Vitreous degeneration, bilateral: Secondary | ICD-10-CM | POA: Diagnosis not present

## 2015-07-17 DIAGNOSIS — H35033 Hypertensive retinopathy, bilateral: Secondary | ICD-10-CM | POA: Diagnosis not present

## 2015-07-18 ENCOUNTER — Emergency Department (HOSPITAL_COMMUNITY)
Admission: EM | Admit: 2015-07-18 | Discharge: 2015-07-18 | Disposition: A | Discharge status: Home / Self Care | Payer: Medicare Other | Attending: Emergency Medicine | Admitting: Emergency Medicine

## 2015-07-18 ENCOUNTER — Encounter (HOSPITAL_COMMUNITY): Payer: Self-pay | Admitting: Oncology

## 2015-07-18 DIAGNOSIS — Z79891 Long term (current) use of opiate analgesic: Secondary | ICD-10-CM | POA: Diagnosis not present

## 2015-07-18 DIAGNOSIS — I10 Essential (primary) hypertension: Secondary | ICD-10-CM | POA: Insufficient documentation

## 2015-07-18 DIAGNOSIS — E871 Hypo-osmolality and hyponatremia: Secondary | ICD-10-CM | POA: Diagnosis not present

## 2015-07-18 DIAGNOSIS — Z598 Other problems related to housing and economic circumstances: Secondary | ICD-10-CM | POA: Diagnosis not present

## 2015-07-18 DIAGNOSIS — Z79899 Other long term (current) drug therapy: Secondary | ICD-10-CM | POA: Insufficient documentation

## 2015-07-18 DIAGNOSIS — Z7984 Long term (current) use of oral hypoglycemic drugs: Secondary | ICD-10-CM | POA: Diagnosis not present

## 2015-07-18 DIAGNOSIS — E119 Type 2 diabetes mellitus without complications: Secondary | ICD-10-CM | POA: Insufficient documentation

## 2015-07-18 DIAGNOSIS — I639 Cerebral infarction, unspecified: Secondary | ICD-10-CM | POA: Diagnosis not present

## 2015-07-18 DIAGNOSIS — Z0000 Encounter for general adult medical examination without abnormal findings: Secondary | ICD-10-CM | POA: Diagnosis present

## 2015-07-18 DIAGNOSIS — Z593 Problems related to living in residential institution: Secondary | ICD-10-CM

## 2015-07-18 DIAGNOSIS — Z5989 Other problems related to housing and economic circumstances: Secondary | ICD-10-CM

## 2015-07-18 DIAGNOSIS — F319 Bipolar disorder, unspecified: Secondary | ICD-10-CM | POA: Insufficient documentation

## 2015-07-18 LAB — CBC
HCT: 35.3 % — ABNORMAL LOW (ref 39.0–52.0)
Hemoglobin: 12.4 g/dL — ABNORMAL LOW (ref 13.0–17.0)
MCH: 31.9 pg (ref 26.0–34.0)
MCHC: 35.1 g/dL (ref 30.0–36.0)
MCV: 90.7 fL (ref 78.0–100.0)
Platelets: 250 10*3/uL (ref 150–400)
RBC: 3.89 MIL/uL — ABNORMAL LOW (ref 4.22–5.81)
RDW: 13 % (ref 11.5–15.5)
WBC: 5 10*3/uL (ref 4.0–10.5)

## 2015-07-18 LAB — ACETAMINOPHEN LEVEL: Acetaminophen (Tylenol), Serum: <10 ug/mL — ABNORMAL LOW (ref 10–30)

## 2015-07-18 LAB — COMPREHENSIVE METABOLIC PANEL
ALBUMIN: 4.3 g/dL (ref 3.5–5.0)
ALK PHOS: 87 U/L (ref 38–126)
ALT: 23 U/L (ref 17–63)
ANION GAP: 8 (ref 5–15)
AST: 24 U/L (ref 15–41)
BILIRUBIN TOTAL: 0.4 mg/dL (ref 0.3–1.2)
BUN: 13 mg/dL (ref 6–20)
CALCIUM: 9.3 mg/dL (ref 8.9–10.3)
CO2: 27 mmol/L (ref 22–32)
CREATININE: 1.21 mg/dL (ref 0.61–1.24)
Chloride: 103 mmol/L (ref 101–111)
Glucose, Bld: 123 mg/dL — ABNORMAL HIGH (ref 65–99)
Potassium: 3.8 mmol/L (ref 3.5–5.1)
SODIUM: 138 mmol/L (ref 135–145)
TOTAL PROTEIN: 7.4 g/dL (ref 6.5–8.1)

## 2015-07-18 LAB — SALICYLATE LEVEL

## 2015-07-18 LAB — ETHANOL: Alcohol, Ethyl (B): <5 mg/dL (ref <5)

## 2015-07-18 NOTE — ED Notes (Signed)
When this writer asked if pt has SI/HI he reported he did.  Pt went on to say that he was not SI/HI he just did not want to go back to the group home.

## 2015-07-18 NOTE — ED Provider Notes (Signed)
CSN: [id number]     Arrival date & time 07/18/15  2004 History  By signing my name below, I, Doreatha Martin, attest that this documentation has been prepared under the direction and in the presence of  Arthor Captain, PA-C. Electronically Signed: Doreatha Martin, ED Scribe. 07/18/2015. 8:53 PM.    Chief Complaint  Patient presents with  . Medical Clearance   The history is provided by the patient. No language interpreter was used.    HPI Comments: Mitchell [name] is a 55 y.o. male with h/o HTN, DM, mental disorder, bipolar affective disorder, schizoaffective disorder brought in by ambulance who presents to the Emergency Department for medical clearance. Pt called EMS himself requesting transport for psychiatric evaluation. Pt reports he is "tired" of the group home he currently stays at. He reports that they give away his information to strangers at restaurants. He also expresses the belief that they represent him unfairly by labeling him as a rapist or child molester. Per pt, he is followed by a Child psychotherapist. Denies SI, HI, HA, fever, CP, chills.   Past Medical History  Diagnosis Date  . Hyponatremia     history of  . Hypertension     takes Amlodipine,Lisinopril and Clonidine daily  . Seasonal allergies     takes Claritin daily  . Diabetes mellitus     takes Victoza,Metformin,and Glipizide daily  . Mental disorder     takes Lithium daily  . Bipolar affective disorder (HCC)     takes Zyprexa daily  . Schizoaffective disorder     takes Trazodone nightly  . Sleep apnea     sleep study >22yrs ago  . Stroke [facility])     left arm weakness   Past Surgical History  Procedure Laterality Date  . Circumcision  20 yrs. ago  . Cataract extraction w/phaco Right 02/14/2013    Procedure: CATARACT EXTRACTION PHACO AND INTRAOCULAR LENS PLACEMENT (IOC);  Surgeon: Shade Flood, MD;  Location: [facility] OR;  Service: Ophthalmology;  Laterality: Right;  . Eye surgery    . Cataract extraction w/phaco Left 06/13/2013     Procedure: CATARACT EXTRACTION PHACO AND INTRAOCULAR LENS PLACEMENT (IOC);  Surgeon: Shade Flood, MD;  Location: [facility] OR;  Service: Ophthalmology;  Laterality: Left;   History reviewed. No pertinent family history. Social History  Substance Use Topics  . Smoking status: Never Smoker   . Smokeless tobacco: Never Used  . Alcohol Use: No    Review of Systems  Constitutional: Negative for fever and chills.  Cardiovascular: Negative for chest pain.  Neurological: Negative for headaches.  Psychiatric/Behavioral: Negative for suicidal ideas.   Allergies  Review of patient's allergies indicates no known allergies.  Home Medications   Prior to Admission medications   Medication Sig Start Date End Date Taking? Authorizing Provider  acetaminophen (TYLENOL) 500 MG tablet Take 2 tablets (1,000 mg total) by mouth every 6 (six) hours as needed for mild pain or fever. 03/25/14   Rodolph Bong, MD  amLODipine (NORVASC) 10 MG tablet Take 10 mg by mouth daily.    Historical Provider, MD  aspirin EC 81 MG tablet Take 81 mg by mouth daily.    Historical Provider, MD  cloNIDine (CATAPRES) 0.1 MG tablet Take 0.1 mg by mouth 2 (two) times daily. 02/17/11   Viviann Spare, FNP  glipiZIDE (GLUCOTROL XL) 10 MG 24 hr tablet Take 10 mg by mouth daily.    Historical Provider, MD  insulin aspart (NOVOLOG) 100 UNIT/ML injection Inject 0-15 Units  into the skin 3 (three) times daily with meals. 10/03/14   Kathlen Mody, MD  ipratropium (ATROVENT) 0.06 % nasal spray Place 2 sprays into both nostrils 4 (four) times daily. 03/25/14   Rodolph Bong, MD  Liraglutide (VICTOZA) 18 MG/3ML SOLN injection Inject 0.6 mg into the skin daily.    Historical Provider, MD  lisinopril (PRINIVIL,ZESTRIL) 40 MG tablet Take 40 mg by mouth daily.    Historical Provider, MD  lithium 300 MG tablet Take 1.5 tablets (450 mg total) by mouth every morning. 10/03/14   Kathlen Mody, MD  lithium carbonate (ESKALITH) 450 MG CR tablet Take 450 mg by  mouth at bedtime.    Historical Provider, MD  loratadine (CLARITIN) 10 MG tablet Take 10 mg by mouth daily.    Historical Provider, MD  metFORMIN (GLUCOPHAGE) 1000 MG tablet Take 1,000 mg by mouth 2 (two) times daily with a meal.    Historical Provider, MD  OLANZapine (ZYPREXA) 10 MG tablet Take 10 mg by mouth every morning.    Historical Provider, MD  OLANZapine (ZYPREXA) 20 MG tablet Take 20 mg by mouth at bedtime.    Historical Provider, MD  polyethylene glycol (MIRALAX / GLYCOLAX) packet Take 17 g by mouth daily.    Historical Provider, MD  traZODone (DESYREL) 50 MG tablet Take 50 mg by mouth at bedtime.     Historical Provider, MD   BP 150/92 mmHg  Pulse 79  Temp(Src) 98.9 F (37.2 C) (Oral)  Resp 20  Ht  (1.626 m)  Wt 210 lb (95.255 kg)  BMI 36.03 kg/m2  SpO2 96% Physical Exam  Constitutional: He is oriented to person, place, and time. He appears well-developed and well-nourished.  HENT:  Head: Normocephalic and atraumatic.  Eyes: Conjunctivae are normal.  Cardiovascular: Normal rate.   Pulmonary/Chest: Effort normal. No respiratory distress.  Abdominal: He exhibits no distension.  Musculoskeletal: Normal range of motion.  Neurological: He is alert and oriented to person, place, and time.  Skin: Skin is warm and dry.  Psychiatric: He has a normal mood and affect. His behavior is normal. His affect is not angry and not inappropriate. He is not combative. Thought content is not paranoid and not delusional. He does not express inappropriate judgment. He expresses no homicidal and no suicidal ideation. He expresses no suicidal plans and no homicidal plans.  Nursing note and vitals reviewed.   ED Course  Procedures (including critical care time) DIAGNOSTIC STUDIES: Oxygen Saturation is 96% on RA, adequate by my interpretation.    COORDINATION OF CARE: 8:51 PM Discussed treatment plan with pt at bedside which includes lab work and pt agreed to plan.   Labs Review Labs  Reviewed  COMPREHENSIVE METABOLIC PANEL  ETHANOL  SALICYLATE LEVEL  ACETAMINOPHEN LEVEL  CBC  URINE RAPID DRUG SCREEN, HOSP PERFORMED   I have personally reviewed and evaluated these lab results as part of my medical decision-making.   MDM   Final diagnoses:  Lives in group home  Living accommodation issues    Patient is unhappy with his group home. Seems to havde some paranoid fixation. I have spokenw with Britnney our CSW who   Patient cleared for discharge. Will be escorted by non-emergency officer. Conni Slipper has communicated with His group home. Appears safe for discharge      I personally performed the services described in this documentation, which was scribed in my presence. The recorded information has been reviewed and is accurate.    Arthor Captain, PA-C 07/19/15  [id number]  Arthor Captainbigail Kae Lauman, PA-C 07/19/15 2331  Lyndal Pulleyaniel Knott, MD 07/20/15 [phone]

## 2015-07-18 NOTE — Progress Notes (Signed)
Patient is his own legal guardian. CSW reached out to group home supervisor who confirms that patient is his own legal guardian.  Trish MageBrittney Ukiah Trawick, LCSWA [phone] ED CSW 07/18/2015 9:47 PM

## 2015-07-18 NOTE — Progress Notes (Signed)
CSW reached out to Non-emergency police and spoke with dispatch. He states he will send a [employer] offier to [facility]. However, he states that it is not a guarantee that the patient will be transported.  CSW provided dispatch with room number, and name for patient. EDP aware.  Trish MageBrittney Bobbyjo [name], LCSWA [phone] ED CSW 07/18/2015 10:27 PM

## 2015-07-18 NOTE — Progress Notes (Signed)
CSW was notified by PA that patient is from group home.  CSW met with patient at bedside. Patient confirms that he is from group home. Patient states that he is currently not happy with his placement. Patient states that he has been at the facility for 5 years. However, he states now that he has been having issues with staff and residents. Patient states that staff and residents have been going into [employer] and fast food places and telling people that he steals, and is a child molester. Patient states that these things are untrue.  CSW provided supportive counseling for patient. Patient appears to be in better spirits at this time. Patient denies SI, HI, AVH. CSW informed patient that new placement could not be found from [facility]. Patient expressed understanding. CSW asked patient if it was okay to reach out to group home. Patient provided CSW with a contact/Ray [phone].  CSW reached out to Ray. He States that he is the supervisor for [facility], which is where the patient resides. CSW informed Group Home Supervisor that the patient would be up for discharge soon. Ray expressed understanding, and states pt is welcomed back. However, he states that he does not have anyone who would be able to pick him up at this time. He states pt can come back by EMS.  CSW made PA aware.   , LCSWA [phone] ED CSW 07/18/2015 9:32 PM     

## 2015-07-18 NOTE — ED Notes (Signed)
Per EMS pt c/o not liking the group home he is in so he requested transport here for a psychiatric evaluation.  Denies SI/HI to EMS.

## 2015-07-18 NOTE — Discharge Instructions (Signed)
Our social worker is working on an outpatient case Freight forwarder to help you with your living arrangements.  Stress and Stress Management Stress is a normal reaction to life events. It is what you feel when life demands more than you are used to or more than you can handle. Some stress can be useful. For example, the stress reaction can help you catch the last bus of the day, study for a test, or meet a deadline at work. But stress that occurs too often or for too long can cause problems. It can affect your emotional health and interfere with relationships and normal daily activities. Too much stress can weaken your immune system and increase your risk for physical illness. If you already have a medical problem, stress can make it worse. CAUSES  All sorts of life events may cause stress. An event that causes stress for one person may not be stressful for another person. Major life events commonly cause stress. These may be positive or negative. Examples include losing your job, moving into a new home, getting married, having a baby, or losing a loved one. Less obvious life events may also cause stress, especially if they occur day after day or in combination. Examples include working long hours, driving in traffic, caring for children, being in debt, or being in a difficult relationship. SIGNS AND SYMPTOMS Stress may cause emotional symptoms including, the following:  Anxiety. This is feeling worried, afraid, on edge, overwhelmed, or out of control.  Anger. This is feeling irritated or impatient.  Depression. This is feeling sad, down, helpless, or guilty.  Difficulty focusing, remembering, or making decisions. Stress may cause physical symptoms, including the following:   Aches and pains. These may affect your head, neck, back, stomach, or other areas of your body.  Tight muscles or clenched jaw.  Low energy or trouble sleeping. Stress may cause unhealthy behaviors, including the following:    Eating to feel better (overeating) or skipping meals.  Sleeping too little, too much, or both.  Working too much or putting off tasks (procrastination).  Smoking, drinking alcohol, or using drugs to feel better. DIAGNOSIS  Stress is diagnosed through an assessment by your health care provider. Your health care provider will ask questions about your symptoms and any stressful life events.Your health care provider will also ask about your medical history and may order blood tests or other tests. Certain medical conditions and medicine can cause physical symptoms similar to stress. Mental illness can cause emotional symptoms and unhealthy behaviors similar to stress. Your health care provider may refer you to a mental health professional for further evaluation.  TREATMENT  Stress management is the recommended treatment for stress.The goals of stress management are reducing stressful life events and coping with stress in healthy ways.  Techniques for reducing stressful life events include the following:  Stress identification. Self-monitor for stress and identify what causes stress for you. These skills may help you to avoid some stressful events.  Time management. Set your priorities, keep a calendar of events, and learn to say "no." These tools can help you avoid making too many commitments. Techniques for coping with stress include the following:  Rethinking the problem. Try to think realistically about stressful events rather than ignoring them or overreacting. Try to find the positives in a stressful situation rather than focusing on the negatives.  Exercise. Physical exercise can release both physical and emotional tension. The key is to find a form of exercise you enjoy and do it  regularly.  Relaxation techniques. These relax the body and mind. Examples include yoga, meditation, tai chi, biofeedback, deep breathing, progressive muscle relaxation, listening to music, being out in  nature, journaling, and other hobbies. Again, the key is to find one or more that you enjoy and can do regularly.  Healthy lifestyle. Eat a balanced diet, get plenty of sleep, and do not smoke. Avoid using alcohol or drugs to relax.  Strong support network. Spend time with family, friends, or other people you enjoy being around.Express your feelings and talk things over with someone you trust. Counseling or talktherapy with a mental health professional may be helpful if you are having difficulty managing stress on your own. Medicine is typically not recommended for the treatment of stress.Talk to your health care provider if you think you need medicine for symptoms of stress. HOME CARE INSTRUCTIONS  Keep all follow-up visits as directed by your health care provider.  Take all medicines as directed by your health care provider. SEEK MEDICAL CARE IF:  Your symptoms get worse or you start having new symptoms.  You feel overwhelmed by your problems and can no longer manage them on your own. SEEK IMMEDIATE MEDICAL CARE IF:  You feel like hurting yourself or someone else.   This information is not intended to replace advice given to you by your health care provider. Make sure you discuss any questions you have with your health care provider.   Document Released: 09/08/2000 Document Revised: 04/05/2014 Document Reviewed: 11/07/2012 Elsevier Interactive Patient Education [employer].

## 2015-12-30 ENCOUNTER — Ambulatory Visit (INDEPENDENT_AMBULATORY_CARE_PROVIDER_SITE_OTHER): Payer: Medicare Other | Admitting: Physician Assistant

## 2015-12-30 ENCOUNTER — Encounter: Payer: Self-pay | Admitting: Physician Assistant

## 2015-12-30 VITALS — BP 140/88 | HR 98 | Temp 97.5°F | Resp 17 | Ht 64.0 in | Wt 232.0 lb

## 2015-12-30 DIAGNOSIS — J069 Acute upper respiratory infection, unspecified: Secondary | ICD-10-CM | POA: Diagnosis not present

## 2015-12-30 MED ORDER — FLUTICASONE PROPIONATE 50 MCG/ACT NA SUSP: 2.0000 | Freq: Every day | NASAL | 2 refills | Status: DC

## 2015-12-30 MED ORDER — GUAIFENESIN ER 1200 MG PO TB12: 1.0000 | ORAL_TABLET | Freq: Two times a day (BID) | ORAL | 1 refills | Status: DC | PRN

## 2015-12-30 MED ORDER — BENZONATATE 100 MG PO CAPS: 100.0000 mg | ORAL_CAPSULE | Freq: Three times a day (TID) | ORAL | 0 refills | Status: DC | PRN

## 2015-12-30 NOTE — Progress Notes (Signed)
[facility] [address] [place] [address] [phone]  Date:  12/30/2015   Name:  Mitchell [name]   DOB:  November 26, 1960   MRN:  [id number]  PCP:  Dorrene German, MD    History of Present Illness:  Mitchell [name] is a 55 y.o. male patient who presents to [facility] for congestion, sore throat, cough.  He has a bad cough that started last Sunday.  He has some nasal congestion.  Mild sore throat.  He has no ear pain.    He had emesis 2 days ago.  This was non-bilious or bloody.  No diarrhea.  He takes no tylenol or ibuprofen.  He had some subjective fever and chills.  No so b or dyspnea.  Patient lives in a group home with another contact who has been sick as well. Patient Active Problem List   Diagnosis Date Noted  . 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 Coarse tremors 10/02/2014  . Hypertension   . Type 2 diabetes mellitus (HCC)   . Sleep apnea   . Hyponatremia 03/13/2011  . Schizoaffective disorder, bipolar type (HCC) 02/28/2011    Past Medical History:  Diagnosis Date  . Bipolar affective disorder (HCC)    takes Zyprexa daily  . Diabetes mellitus    takes Victoza,Metformin,and Glipizide daily  . Hypertension    takes Amlodipine,Lisinopril and Clonidine daily  . Hyponatremia    history of  . Mental disorder    takes Lithium daily  . Schizoaffective disorder    takes Trazodone nightly  . Seasonal allergies    takes Claritin daily  . Sleep apnea    sleep study >50yrs ago  . Stroke [facility])    left arm weakness    Past Surgical History:  Procedure Laterality Date  . CATARACT EXTRACTION W/PHACO Right 02/14/2013   Procedure: CATARACT EXTRACTION PHACO AND INTRAOCULAR LENS PLACEMENT (IOC);  Surgeon: Shade Flood, MD;  Location: [facility] OR;  Service: Ophthalmology;  Laterality: Right;  . CATARACT EXTRACTION W/PHACO Left 06/13/2013   Procedure: CATARACT EXTRACTION PHACO AND INTRAOCULAR LENS PLACEMENT (IOC);  Surgeon: Shade Flood, MD;  Location: [facility] OR;  Service:  Ophthalmology;  Laterality: Left;  . CIRCUMCISION  20 yrs. ago  . EYE SURGERY      Social History  Substance Use Topics  . Smoking status: Never Smoker  . Smokeless tobacco: Never Used  . Alcohol use No    No family history on file.  No Known Allergies  Medication list has been reviewed and updated.  Current Outpatient Prescriptions on File Prior to Visit  Medication Sig Dispense Refill  . acetaminophen (TYLENOL) 500 MG tablet Take 2 tablets (1,000 mg total) by mouth every 6 (six) hours as needed for mild pain or fever. 90 tablet 1  . amLODipine (NORVASC) 10 MG tablet Take 10 mg by mouth daily.    Marland Kitchen aspirin EC 81 MG tablet Take 81 mg by mouth daily.    . cloNIDine (CATAPRES) 0.1 MG tablet Take 0.1 mg by mouth 2 (two) times daily.    Marland Kitchen glipiZIDE (GLUCOTROL XL) 10 MG 24 hr tablet Take 10 mg by mouth daily.    . insulin aspart (NOVOLOG) 100 UNIT/ML injection Inject 0-15 Units into the skin 3 (three) times daily with meals. (Patient taking differently: Inject 0-15 Units into the skin 3 (three) times daily as needed for high blood sugar. BS 151-200: Take 2 units, 201-250: Takes 4 units, 251-300: Takes 6 units, 301-350: Take 8 units,  351-400: Take 10 units above 400 Take 15 units then notify MD.) 10 mL 11  . ipratropium (ATROVENT) 0.06 % nasal spray Place 2 sprays into both nostrils 4 (four) times daily. 15 mL 1  . Liraglutide (VICTOZA) 18 MG/3ML SOLN injection Inject 0.6 mg into the skin daily.    Marland Kitchen lisinopril (PRINIVIL,ZESTRIL) 40 MG tablet Take 40 mg by mouth daily.    Marland Kitchen lithium 300 MG tablet Take 1.5 tablets (450 mg total) by mouth every morning.    . lithium 300 MG tablet Take 600 mg by mouth daily.    Marland Kitchen lithium carbonate (ESKALITH) 450 MG CR tablet Take 450 mg by mouth at bedtime.    Marland Kitchen loratadine (CLARITIN) 10 MG tablet Take 10 mg by mouth daily.    . metFORMIN (GLUCOPHAGE) 1000 MG tablet Take 1,000 mg by mouth 2 (two) times daily with a meal.    . OLANZapine (ZYPREXA) 10 MG tablet  Take 10 mg by mouth every morning.    Marland Kitchen OLANZapine (ZYPREXA) 20 MG tablet Take 20 mg by mouth at bedtime.    . polyethylene glycol (MIRALAX / GLYCOLAX) packet Take 17 g by mouth daily.    . traZODone (DESYREL) 50 MG tablet Take 50 mg by mouth at bedtime.      No current facility-administered medications on file prior to visit.     ROS ROS otherwise unremarkable unless listed above.   Physical Examination: BP 140/88 (BP Location: Left Arm, Patient Position: Sitting, Cuff Size: Large)   Pulse 98   Temp 97.5 F (36.4 C) (Oral)   Resp 17   Ht 5\' 4"  (1.626 m)   Wt 232 lb (105.2 kg)   SpO2 96%   BMI 39.82 kg/m  Ideal Body Weight: Weight in (lb) to have BMI = 25: 145.3  Physical Exam  Constitutional: He is oriented to person, place, and time. He appears well-developed and well-nourished. No distress.  HENT:  Head: Normocephalic and atraumatic.  Right Ear: Tympanic membrane, external ear and ear canal normal.  Left Ear: Tympanic membrane, external ear and ear canal normal.  Nose: Mucosal edema and rhinorrhea present. Right sinus exhibits no maxillary sinus tenderness and no frontal sinus tenderness. Left sinus exhibits no maxillary sinus tenderness and no frontal sinus tenderness.  Mouth/Throat: No uvula swelling. No oropharyngeal exudate, posterior oropharyngeal edema or posterior oropharyngeal erythema.  Eyes: Conjunctivae, EOM and lids are normal. Pupils are equal, round, and reactive to light. Right eye exhibits normal extraocular motion. Left eye exhibits normal extraocular motion.  Neck: Trachea normal and full passive range of motion without pain. No edema and no erythema present.  Cardiovascular: Normal rate.   Pulmonary/Chest: Effort normal. No respiratory distress. He has no decreased breath sounds. He has no wheezes. He has no rhonchi.  Neurological: He is alert and oriented to person, place, and time.  Skin: Skin is warm and dry. He is not diaphoretic.  Psychiatric: He has a  normal mood and affect. His behavior is normal.     Assessment and Plan: Mitchell [name] is a 55 y.o. male who is here today for cc of cough, sore throat, and nasal congestion.   --Advised that if his symptoms continued that the home should contact us and we will start an antibiotic at that time. Given supportive treatment with Mucinex and Tessalon Perles at this time. Also given Flonase at this time. Advised him to continue taking his the allergy medication. If this caused her with patient still having his  symptoms in 6-8 days, please order a Z-Pak at that time.  Acute upper respiratory infection - Plan: fluticasone (FLONASE) 50 MCG/ACT nasal spray, benzonatate (TESSALON) 100 MG capsule, Guaifenesin (MUCINEX MAXIMUM STRENGTH) 1200 MG TB12  Trena PlattStephanie Adelaide Pfefferkorn, PA-C [facility] 12/30/2015 10:22 AM

## 2015-12-30 NOTE — Patient Instructions (Addendum)
If symptoms do not improve in the next 8 days, please contact by phone for antibiotic.   Take the mucinex every 12 hours as directed.  And use the tessalon perles for cough.  If he develops shortness of breath or trouble breathing, then he needs to return.   Upper Respiratory Infection, Adult Most upper respiratory infections (URIs) are a viral infection of the air passages leading to the lungs. A URI affects the nose, throat, and upper air passages. The most common type of URI is nasopharyngitis and is typically referred to as "the common cold." URIs run their course and usually go away on their own. Most of the time, a URI does not require medical attention, but sometimes a bacterial infection in the upper airways can follow a viral infection. This is called a secondary infection. Sinus and middle ear infections are common types of secondary upper respiratory infections. Bacterial pneumonia can also complicate a URI. A URI can worsen asthma and chronic obstructive pulmonary disease (COPD). Sometimes, these complications can require emergency medical care and may be life threatening.  CAUSES Almost all URIs are caused by viruses. A virus is a type of germ and can spread from one person to another.  RISKS FACTORS You may be at risk for a URI if:   You smoke.   You have chronic heart or lung disease.  You have a weakened defense (immune) system.   You are very young or very old.   You have nasal allergies or asthma.  You work in crowded or poorly ventilated areas.  You work in health care facilities or schools. SIGNS AND SYMPTOMS  Symptoms typically develop 2-3 days after you come in contact with a cold virus. Most viral URIs last 7-10 days. However, viral URIs from the influenza virus (flu virus) can last 14-18 days and are typically more severe. Symptoms may include:   Runny or stuffy (congested) nose.   Sneezing.   Cough.   Sore throat.   Headache.   Fatigue.    Fever.   Loss of appetite.   Pain in your forehead, behind your eyes, and over your cheekbones (sinus pain).  Muscle aches.  DIAGNOSIS  Your health care provider may diagnose a URI by:  Physical exam.  Tests to check that your symptoms are not due to another condition such as:  Strep throat.  Sinusitis.  Pneumonia.  Asthma. TREATMENT  A URI goes away on its own with time. It cannot be cured with medicines, but medicines may be prescribed or recommended to relieve symptoms. Medicines may help:  Reduce your fever.  Reduce your cough.  Relieve nasal congestion. HOME CARE INSTRUCTIONS   Take medicines only as directed by your health care provider.   Gargle warm saltwater or take cough drops to comfort your throat as directed by your health care provider.  Use a warm mist humidifier or inhale steam from a shower to increase air moisture. This may make it easier to breathe.  Drink enough fluid to keep your urine clear or pale yellow.   Eat soups and other clear broths and maintain good nutrition.   Rest as needed.   Return to work when your temperature has returned to normal or as your health care provider advises. You may need to stay home longer to avoid infecting others. You can also use a face mask and careful hand washing to prevent spread of the virus.  Increase the usage of your inhaler if you have asthma.  Do not use any tobacco products, including cigarettes, chewing tobacco, or electronic cigarettes. If you need help quitting, ask your health care provider. PREVENTION  The best way to protect yourself from getting a cold is to practice good hygiene.   Avoid oral or hand contact with people with cold symptoms.   Wash your hands often if contact occurs.  There is no clear evidence that vitamin C, vitamin E, echinacea, or exercise reduces the chance of developing a cold. However, it is always recommended to get plenty of rest, exercise, and  practice good nutrition.  SEEK MEDICAL CARE IF:   You are getting worse rather than better.   Your symptoms are not controlled by medicine.   You have chills.  You have worsening shortness of breath.  You have brown or red mucus.  You have yellow or brown nasal discharge.  You have pain in your face, especially when you bend forward.  You have a fever.  You have swollen neck glands.  You have pain while swallowing.  You have white areas in the back of your throat. SEEK IMMEDIATE MEDICAL CARE IF:   You have severe or persistent:  Headache.  Ear pain.  Sinus pain.  Chest pain.  You have chronic lung disease and any of the following:  Wheezing.  Prolonged cough.  Coughing up blood.  A change in your usual mucus.  You have a stiff neck.  You have changes in your:  Vision.  Hearing.  Thinking.  Mood. MAKE SURE YOU:   Understand these instructions.  Will watch your condition.  Will get help right away if you are not doing well or get worse.   This information is not intended to replace advice given to you by your health care provider. Make sure you discuss any questions you have with your health care provider.   Document Released: 09/08/2000 Document Revised: 07/30/2014 Document Reviewed: 06/20/2013 Elsevier Interactive Patient Education 2016 [employer]     IF you received an x-ray today, you will receive an invoice from [facility]. Please contact [facility] at [phone] with questions or concerns regarding your invoice.   IF you received labwork today, you will receive an invoice from [facility]. Please contact [facility] at [phone] with questions or concerns regarding your invoice.   Our billing staff will not be able to assist you with questions regarding bills from these companies.  You will be contacted with the lab results as soon as they are available. The fastest way to get your  results is to activate your My Chart account. Instructions are located on the last page of this paperwork. If you have not heard from us regarding the results in 2 weeks, please contact this office.

## 2016-01-21 ENCOUNTER — Ambulatory Visit (INDEPENDENT_AMBULATORY_CARE_PROVIDER_SITE_OTHER): Payer: Medicare Other | Admitting: Ophthalmology

## 2016-01-21 DIAGNOSIS — E11319 Type 2 diabetes mellitus with unspecified diabetic retinopathy without macular edema: Secondary | ICD-10-CM

## 2016-01-21 DIAGNOSIS — I10 Essential (primary) hypertension: Secondary | ICD-10-CM | POA: Diagnosis not present

## 2016-01-21 DIAGNOSIS — E113293 Type 2 diabetes mellitus with mild nonproliferative diabetic retinopathy without macular edema, bilateral: Secondary | ICD-10-CM | POA: Diagnosis not present

## 2016-01-21 DIAGNOSIS — H26492 Other secondary cataract, left eye: Secondary | ICD-10-CM

## 2016-01-21 DIAGNOSIS — H35033 Hypertensive retinopathy, bilateral: Secondary | ICD-10-CM

## 2016-01-21 DIAGNOSIS — H43813 Vitreous degeneration, bilateral: Secondary | ICD-10-CM | POA: Diagnosis not present

## 2016-02-13 ENCOUNTER — Ambulatory Visit (INDEPENDENT_AMBULATORY_CARE_PROVIDER_SITE_OTHER): Payer: Medicare Other | Admitting: Ophthalmology

## 2016-02-13 DIAGNOSIS — H2702 Aphakia, left eye: Secondary | ICD-10-CM

## 2016-04-16 ENCOUNTER — Ambulatory Visit (HOSPITAL_COMMUNITY)
Admission: EM | Admit: 2016-04-16 | Discharge: 2016-04-16 | Disposition: A | Discharge status: Home / Self Care | Payer: Medicaid Other | Attending: Family Medicine | Admitting: Family Medicine

## 2016-04-16 ENCOUNTER — Ambulatory Visit (INDEPENDENT_AMBULATORY_CARE_PROVIDER_SITE_OTHER): Payer: Medicaid Other

## 2016-04-16 ENCOUNTER — Encounter (HOSPITAL_COMMUNITY): Payer: Self-pay | Admitting: Emergency Medicine

## 2016-04-16 DIAGNOSIS — S8992XA Unspecified injury of left lower leg, initial encounter: Secondary | ICD-10-CM

## 2016-04-16 MED ORDER — MELOXICAM 7.5 MG PO TABS: 7.5000 mg | ORAL_TABLET | Freq: Two times a day (BID) | ORAL | 1 refills | Status: DC

## 2016-04-16 NOTE — ED Provider Notes (Signed)
[facility]    CSN: [id number] Arrival date & time: 04/16/16  1130     History   Chief Complaint Chief Complaint  Patient presents with  . Knee Injury    HPI Mitchell [name] is a 56 y.o. male.   The history is provided by the patient.  Knee Pain  Location:  Knee Time since incident:  5 days Injury: yes   Mechanism of injury: fall   Fall:    Fall occurred:  Walking   Entrapped after fall: no   Knee location:  L knee Pain details:    Quality:  Sharp   Radiates to:  Does not radiate   Severity:  Moderate   Onset quality:  Sudden   Progression:  Worsening Chronicity:  New Dislocation: no   Prior injury to area:  No Relieved by:  None tried Associated symptoms: decreased ROM and swelling   Risk factors: obesity     Past Medical History:  Diagnosis Date  . Bipolar affective disorder (HCC)    takes Zyprexa daily  . Diabetes mellitus    takes Victoza,Metformin,and Glipizide daily  . Hypertension    takes Amlodipine,Lisinopril and Clonidine daily  . Hyponatremia    history of  . Mental disorder    takes Lithium daily  . Schizoaffective disorder    takes Trazodone nightly  . Seasonal allergies    takes Claritin daily  . Sleep apnea    sleep study >8157yrs ago  . Stroke [facility](HCC)    left arm weakness    Patient Active Problem List   Diagnosis Date Noted  . 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 Coarse tremors 10/02/2014  . Hypertension   . Type 2 diabetes mellitus (HCC)   . Sleep apnea   . Hyponatremia 03/13/2011  . Schizoaffective disorder, bipolar type (HCC) 02/28/2011    Past Surgical History:  Procedure Laterality Date  . CATARACT EXTRACTION W/PHACO Right 02/14/2013   Procedure: CATARACT EXTRACTION PHACO AND INTRAOCULAR LENS PLACEMENT (IOC);  Surgeon: Shade FloodGreer Geiger, MD;  [facility] OR;  Service: Ophthalmology;  Laterality: Right;  . CATARACT EXTRACTION W/PHACO Left 06/13/2013   Procedure: CATARACT EXTRACTION PHACO AND INTRAOCULAR LENS PLACEMENT  (IOC);  Surgeon: Shade FloodGreer Geiger, MD;  [facility];  Service: Ophthalmology;  Laterality: Left;  . CIRCUMCISION  20 yrs. ago  . EYE SURGERY         Home Medications    Prior to Admission medications   Medication Sig Start Date End Date Taking? Authorizing Provider  amLODipine (NORVASC) 10 MG tablet Take 10 mg by mouth daily.   Yes Historical Provider, MD  aspirin EC 81 MG tablet Take 81 mg by mouth daily.   Yes Historical Provider, MD  benzonatate (TESSALON) 100 MG capsule Take 1-2 capsules (100-200 mg total) by mouth 3 (three) times daily as needed for cough. 12/30/15  Yes Stephanie D English, PA  cloNIDine (CATAPRES) 0.1 MG tablet Take 0.1 mg by mouth 2 (two) times daily. 02/17/11  Yes Viviann SpareMargaret A Scott, FNP  glipiZIDE (GLUCOTROL XL) 10 MG 24 hr tablet Take 10 mg by mouth daily.   Yes Historical Provider, MD  insulin aspart (NOVOLOG) 100 UNIT/ML injection Inject 0-15 Units into the skin 3 (three) times daily with meals. Patient taking differently: Inject 0-15 Units into the skin 3 (three) times daily as needed for high blood sugar. BS 151-200: Take 2 units, 201-250: Takes 4 units, 251-300: Takes 6 units, 301-350: Take 8 units, 351-400: Take 10 units above 400 Take  15 units then notify MD. 10/03/14  Yes Kathlen Mody, MD  ipratropium (ATROVENT) 0.06 % nasal spray Place 2 sprays into both nostrils 4 (four) times daily. 03/25/14  Yes Rodolph Bong, MD  Liraglutide (VICTOZA) 18 MG/3ML SOLN injection Inject 0.6 mg into the skin daily.   Yes Historical Provider, MD  lisinopril (PRINIVIL,ZESTRIL) 40 MG tablet Take 40 mg by mouth daily.   Yes Historical Provider, MD  loratadine (CLARITIN) 10 MG tablet Take 10 mg by mouth daily.   Yes Historical Provider, MD  metFORMIN (GLUCOPHAGE) 1000 MG tablet Take 1,000 mg by mouth 2 (two) times daily with a meal.   Yes Historical Provider, MD  OLANZapine (ZYPREXA) 10 MG tablet Take 10 mg by mouth every morning.   Yes Historical Provider, MD  OLANZapine (ZYPREXA) 20  MG tablet Take 20 mg by mouth at bedtime.   Yes Historical Provider, MD  polyethylene glycol (MIRALAX / GLYCOLAX) packet Take 17 g by mouth daily.   Yes Historical Provider, MD  traZODone (DESYREL) 50 MG tablet Take 50 mg by mouth at bedtime.    Yes Historical Provider, MD  acetaminophen (TYLENOL) 500 MG tablet Take 2 tablets (1,000 mg total) by mouth every 6 (six) hours as needed for mild pain or fever. 03/25/14   Rodolph Bong, MD  fluticasone (FLONASE) 50 MCG/ACT nasal spray Place 2 sprays into both nostrils daily. 12/30/15   Collie Siad English, PA  Guaifenesin (MUCINEX MAXIMUM STRENGTH) 1200 MG TB12 Take 1 tablet (1,200 mg total) by mouth every 12 (twelve) hours as needed. 12/30/15   Collie Siad English, PA  lithium 300 MG tablet Take 1.5 tablets (450 mg total) by mouth every morning. 10/03/14   Kathlen Mody, MD  lithium 300 MG tablet Take 600 mg by mouth daily.    Historical Provider, MD  lithium carbonate (ESKALITH) 450 MG CR tablet Take 450 mg by mouth at bedtime.    Historical Provider, MD  meloxicam (MOBIC) 7.5 MG tablet Take 1 tablet (7.5 mg total) by mouth 2 (two) times daily after a meal. 04/16/16   Linna Hoff, MD    Family History History reviewed. No pertinent family history.  Social History Social History  Substance Use Topics  . Smoking status: Never Smoker  . Smokeless tobacco: Never Used  . Alcohol use No     Allergies   Patient has no known allergies.   Review of Systems Review of Systems  Musculoskeletal: Positive for gait problem and joint swelling.  All other systems reviewed and are negative.    Physical Exam Triage Vital Signs ED Triage Vitals [04/16/16 1345]  Enc Vitals Group     BP 152/96     Pulse Rate 107     Resp 22     Temp 98.2 F (36.8 C)     Temp Source Oral     SpO2 96 %     Weight      Height      Head Circumference      Peak Flow      Pain Score      Pain Loc      Pain Edu?      Excl. in GC?    No data found.   Updated Vital  Signs BP 152/96 (BP Location: Left Arm)   Pulse 107   Temp 98.2 F (36.8 C) (Oral)   Resp 22   SpO2 96%   Visual Acuity Right Eye Distance:   Left Eye Distance:  Bilateral Distance:    Right Eye Near:   Left Eye Near:    Bilateral Near:     Physical Exam  Constitutional: He is oriented to person, place, and time. He appears well-developed and well-nourished.  Musculoskeletal: He exhibits tenderness and deformity.       Left knee: He exhibits decreased range of motion, swelling and effusion. He exhibits no ecchymosis, no deformity, normal alignment and normal patellar mobility. Tenderness found.  Neurological: He is alert and oriented to person, place, and time.  Nursing note and vitals reviewed.    UC Treatments / Results  Labs (all labs ordered are listed, but only abnormal results are displayed) Labs Reviewed - No data to display  EKG  EKG Interpretation None       Radiology No results found.  Procedures Procedures (including critical care time)  Medications Ordered in UC Medications - No data to display   Initial Impression / Assessment and Plan / UC Course  I have reviewed the triage vital signs and the nursing notes.  Pertinent labs & imaging results that were available during my care of the patient were reviewed by me and considered in my medical decision making (see chart for details).       Final Clinical Impressions(s) / UC Diagnoses   Final diagnoses:  Injury of left knee, initial encounter    New Prescriptions Discharge Medication List as of 04/16/2016  3:14 PM    START taking these medications   Details  meloxicam (MOBIC) 7.5 MG tablet Take 1 tablet (7.5 mg total) by mouth 2 (two) times daily after a meal., Starting Fri 04/16/2016, Normal         Linna Hoff, MD 04/28/16 2055

## 2016-04-16 NOTE — ED Triage Notes (Signed)
Pt reports he inj his right knee 6 days   States he fell onto floor and hit a VCR tape w/right knee   He reports swelling and pain w/activity  A&O x4... NAD

## 2016-04-16 NOTE — Discharge Instructions (Signed)
Wear brace for swelling, use medicine and see orthopedist for recheck/

## 2016-07-05 ENCOUNTER — Other Ambulatory Visit (HOSPITAL_BASED_OUTPATIENT_CLINIC_OR_DEPARTMENT_OTHER): Payer: Self-pay

## 2016-07-05 DIAGNOSIS — G4730 Sleep apnea, unspecified: Secondary | ICD-10-CM

## 2016-08-27 ENCOUNTER — Ambulatory Visit (HOSPITAL_BASED_OUTPATIENT_CLINIC_OR_DEPARTMENT_OTHER): Payer: Medicare Other | Attending: Internal Medicine | Admitting: Internal Medicine

## 2016-08-27 VITALS — Ht 64.0 in | Wt 210.0 lb

## 2016-08-27 DIAGNOSIS — G4733 Obstructive sleep apnea (adult) (pediatric): Secondary | ICD-10-CM | POA: Diagnosis not present

## 2016-08-27 DIAGNOSIS — G4730 Sleep apnea, unspecified: Secondary | ICD-10-CM

## 2016-08-27 DIAGNOSIS — G4700 Insomnia, unspecified: Secondary | ICD-10-CM | POA: Diagnosis not present

## 2016-09-04 DIAGNOSIS — G4733 Obstructive sleep apnea (adult) (pediatric): Secondary | ICD-10-CM | POA: Diagnosis not present

## 2016-09-04 NOTE — Procedures (Signed)
Patient Name: Mitchell [name], Mitchell [name] Date: 08/27/2016 Gender: Male D.O.B: [birth year] Age (years): 56 Referring Provider: Fleet Contras Height (inches): 64 Interpreting Physician: Jetty Duhamel MD, ABSM Weight (lbs): 210 RPSGT: Armen Pickup BMI: 36 MRN: [id number] Neck Size: 19.00 CLINICAL INFORMATION Sleep Study Type: NPSG  Indication for sleep study: OSA  Epworth Sleepiness Score: 18  Most recent polysomnogram dated 10/09/2014 revealed an AHI of 13.9/h and RDI of 14.2/h. SLEEP STUDY TECHNIQUE As per the AASM Manual for the Scoring of Sleep and Associated Events v2.3 (April 2016) with a hypopnea requiring 4% desaturations.  The channels recorded and monitored were frontal, central and occipital EEG, electrooculogram (EOG), submentalis EMG (chin), nasal and oral airflow, thoracic and abdominal wall motion, anterior tibialis EMG, snore microphone, electrocardiogram, and pulse oximetry.  MEDICATIONS Medications self-administered by patient taken the night of the study : none reported  SLEEP ARCHITECTURE The study was initiated at 10:19:35 PM and ended at 4:35:28 AM.  Sleep onset time was 151.9 minutes and the sleep efficiency was 10.4%. The total sleep time was 39.0 minutes.  Stage REM latency was 33.0 minutes.  The patient spent 3.85% of the night in stage N1 sleep, 79.49% in stage N2 sleep, 0.00% in stage N3 and 16.67% in REM.  Alpha intrusion was absent.  Supine sleep was 100.00%.  RESPIRATORY PARAMETERS The overall apnea/hypopnea index (AHI) was 13.8 per hour. There were 4 total apneas, including 2 obstructive, 1 central and 1 mixed apneas. There were 5 hypopneas and 5 RERAs.  The AHI during Stage REM sleep was 9.2 per hour.  AHI while supine was 13.8 per hour.  The mean oxygen saturation was 91.18%. The minimum SpO2 during sleep was 72.00%.  Moderate snoring was noted during this study.  CARDIAC DATA The 2 lead EKG demonstrated sinus rhythm. The mean heart  rate was 86.81 beats per minute. Other EKG findings include: None.  LEG MOVEMENT DATA The total PLMS were 0 with a resulting PLMS index of 0.00. Associated arousal with leg movement index was 0.0 .  IMPRESSIONS - Significant difficulty initiating and maintaining sleep. Only 39 minutes Total Sleep Time recorded, with bathroom x 4. - Mild obstructive sleep apnea occurred during this study (AHI = 13.8/h). - Insufficient sleep to meet protocol requirements for split CPAP titration. - No significant central sleep apnea occurred during this study (CAI = 1.5/h). - Moderate oxygen desaturation was noted during this study (Min O2 = 72.00%, Mean 91.18%). - The patient snored with Moderate snoring volume. - No cardiac abnormalities were noted during this study. - Clinically significant periodic limb movements did not occur during sleep. No significant associated arousals.  DIAGNOSIS - Obstructive Sleep Apnea (327.23 [G47.33 ICD-10]) - Insomnia (G47.00)  RECOMMENDATIONS - Therapeutic CPAP titration to determine optimal pressure required to alleviate sleep disordered breathing. - Consider if management of insomnia would be appropriate in home environment. Recommend sleep medication if future sleep study required. - Positional therapy avoiding supine position during sleep. - Be careful with alcohol, sedatives and other CNS depressants that may worsen sleep apnea and disrupt normal sleep architecture. - Sleep hygiene should be reviewed to assess factors that may improve sleep quality. - Weight management and regular exercise should be initiated or continued if appropriate.  [Electronically signed] 09/04/2016 09:46 AM  Jetty Duhamel MD, ABSM Diplomate, American Board of Sleep Medicine   NPI: [id number]  Waymon Budge Diplomate, American Board of Sleep Medicine  ELECTRONICALLY SIGNED ON:  09/04/2016, 9:38 AM [facility] PH: [phone]  FX: [phone] ACCREDITED  BY THE AMERICAN ACADEMY OF SLEEP MEDICINE

## 2017-06-26 ENCOUNTER — Emergency Department (HOSPITAL_COMMUNITY): Payer: Medicare Other

## 2017-06-26 ENCOUNTER — Encounter (HOSPITAL_COMMUNITY): Payer: Self-pay

## 2017-06-26 ENCOUNTER — Emergency Department (HOSPITAL_COMMUNITY)
Admission: EM | Admit: 2017-06-26 | Discharge: 2017-06-26 | Disposition: A | Discharge status: Home / Self Care | Payer: Medicare Other | Attending: Emergency Medicine | Admitting: Emergency Medicine

## 2017-06-26 DIAGNOSIS — Z8673 Personal history of transient ischemic attack (TIA), and cerebral infarction without residual deficits: Secondary | ICD-10-CM | POA: Insufficient documentation

## 2017-06-26 DIAGNOSIS — R109 Unspecified abdominal pain: Secondary | ICD-10-CM | POA: Insufficient documentation

## 2017-06-26 DIAGNOSIS — R197 Diarrhea, unspecified: Secondary | ICD-10-CM | POA: Insufficient documentation

## 2017-06-26 DIAGNOSIS — Z7982 Long term (current) use of aspirin: Secondary | ICD-10-CM | POA: Insufficient documentation

## 2017-06-26 DIAGNOSIS — Z794 Long term (current) use of insulin: Secondary | ICD-10-CM | POA: Diagnosis not present

## 2017-06-26 DIAGNOSIS — R112 Nausea with vomiting, unspecified: Secondary | ICD-10-CM | POA: Diagnosis not present

## 2017-06-26 DIAGNOSIS — E119 Type 2 diabetes mellitus without complications: Secondary | ICD-10-CM | POA: Insufficient documentation

## 2017-06-26 DIAGNOSIS — I10 Essential (primary) hypertension: Secondary | ICD-10-CM | POA: Diagnosis not present

## 2017-06-26 DIAGNOSIS — Z79899 Other long term (current) drug therapy: Secondary | ICD-10-CM | POA: Diagnosis not present

## 2017-06-26 LAB — URINALYSIS, ROUTINE W REFLEX MICROSCOPIC
Bilirubin Urine: NEGATIVE
Glucose, UA: NEGATIVE mg/dL
Hgb urine dipstick: NEGATIVE
Ketones, ur: NEGATIVE mg/dL
LEUKOCYTES UA: NEGATIVE
Nitrite: NEGATIVE
PROTEIN: NEGATIVE mg/dL
SPECIFIC GRAVITY, URINE: 1.018 (ref 1.005–1.030)
pH: 6 (ref 5.0–8.0)

## 2017-06-26 LAB — CBC
HCT: 38.8 % — ABNORMAL LOW (ref 39.0–52.0)
HEMOGLOBIN: 13.2 g/dL (ref 13.0–17.0)
MCH: 31.4 pg (ref 26.0–34.0)
MCHC: 34 g/dL (ref 30.0–36.0)
MCV: 92.4 fL (ref 78.0–100.0)
PLATELETS: 213 10*3/uL (ref 150–400)
RBC: 4.2 MIL/uL — AB (ref 4.22–5.81)
RDW: 13 % (ref 11.5–15.5)
WBC: 8 10*3/uL (ref 4.0–10.5)

## 2017-06-26 LAB — COMPREHENSIVE METABOLIC PANEL
ALT: 21 U/L (ref 17–63)
ANION GAP: 9 (ref 5–15)
AST: 24 U/L (ref 15–41)
Albumin: 4.2 g/dL (ref 3.5–5.0)
Alkaline Phosphatase: 91 U/L (ref 38–126)
BUN: 14 mg/dL (ref 6–20)
CHLORIDE: 106 mmol/L (ref 101–111)
CO2: 25 mmol/L (ref 22–32)
CREATININE: 1.41 mg/dL — AB (ref 0.61–1.24)
Calcium: 9.5 mg/dL (ref 8.9–10.3)
GFR calc Af Amer: >60 mL/min (ref >60)
GFR, EST NON AFRICAN AMERICAN: 54 mL/min — AB (ref >60)
Glucose, Bld: 192 mg/dL — ABNORMAL HIGH (ref 65–99)
Potassium: 4.1 mmol/L (ref 3.5–5.1)
SODIUM: 140 mmol/L (ref 135–145)
Total Bilirubin: 0.3 mg/dL (ref 0.3–1.2)
Total Protein: 7.9 g/dL (ref 6.5–8.1)

## 2017-06-26 LAB — I-STAT TROPONIN, ED: TROPONIN I, POC: 0 ng/mL (ref 0.00–0.08)

## 2017-06-26 LAB — LIPASE, BLOOD: LIPASE: 74 U/L — AB (ref 11–51)

## 2017-06-26 LAB — LITHIUM LEVEL: Lithium Lvl: 1.17 mmol/L (ref 0.60–1.20)

## 2017-06-26 MED ORDER — IOPAMIDOL (ISOVUE-300) INJECTION 61%
100.0000 mL | Freq: Once | INTRAVENOUS | Status: AC | PRN
  Administered 2017-06-26: 100 mL via INTRAVENOUS

## 2017-06-26 MED ORDER — IOPAMIDOL (ISOVUE-300) INJECTION 61%
INTRAVENOUS | Status: AC
  Filled 2017-06-26: qty 100

## 2017-06-26 MED ORDER — ONDANSETRON 4 MG PO TBDP: 4.0000 mg | ORAL_TABLET | Freq: Three times a day (TID) | ORAL | 0 refills | Status: AC | PRN

## 2017-06-26 MED ORDER — ONDANSETRON HCL 4 MG/2ML IJ SOLN
4.0000 mg | Freq: Once | INTRAMUSCULAR | Status: DC
  Filled 2017-06-26: qty 2

## 2017-06-26 MED ORDER — SODIUM CHLORIDE 0.9 % IV BOLUS
1000.0000 mL | Freq: Once | INTRAVENOUS | Status: AC
  Administered 2017-06-26: 1000 mL via INTRAVENOUS

## 2017-06-26 NOTE — ED Triage Notes (Signed)
Per EMS, [place].  On/off n/v for a week.  Went to MD on Friday.  Today vomiting and felt dizzy.  Vitals: 130/82, hr 104, resp 18, 94% ra, cbg 120.

## 2017-06-26 NOTE — ED Notes (Signed)
Patient transported to CT 

## 2017-06-26 NOTE — ED Provider Notes (Signed)
[facility]-EMERGENCY DEPT Provider Note   CSN: [id number] Arrival date & time: 06/26/17  1131     History   Chief Complaint Chief Complaint  Patient presents with  . Emesis    HPI Mitchell [name] is a 57 y.o. male with history of bipolar act affective disorder, DM, HTN, allergies, OSA presents today for evaluation of acute onset of nausea, vomiting, and diarrhea.  He states that he has had symptoms for the past 3 days with a few episodes of nonbloody nonbilious emesis and multiple episodes of nonbloody watery stools.  When asked if he had abdominal pain he tells me he is not sure.  He states that today at church he had one episode of bloody emesis for the first time.  He tells me he has felt somewhat short of breath over the past few days.  He notes his abdomen feels heavy and that it feels bloated but that this has been ongoing for some time.  He denies chest pain, fevers, or urinary symptoms.  He states he has been taking his home medications as prescribed.  He has not tried anything for his symptoms.  No recent travel or surgeries, no prior history of DVT or PE.   The history is provided by the patient.    Past Medical History:  Diagnosis Date  . Bipolar affective disorder (HCC)    takes Zyprexa daily  . Diabetes mellitus    takes Victoza,Metformin,and Glipizide daily  . Hypertension    takes Amlodipine,Lisinopril and Clonidine daily  . Hyponatremia    history of  . Mental disorder    takes Lithium daily  . Schizoaffective disorder    takes Trazodone nightly  . Seasonal allergies    takes Claritin daily  . Sleep apnea    sleep study >7457yrs ago  . Stroke [facility](HCC)    left arm weakness    Patient Active Problem List   Diagnosis Date Noted  . 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 Coarse tremors 10/02/2014  . Hypertension   . Type 2 diabetes mellitus (HCC)   . Sleep apnea   . Hyponatremia 03/13/2011  . Schizoaffective disorder, bipolar type (HCC)  02/28/2011    Past Surgical History:  Procedure Laterality Date  . CATARACT EXTRACTION W/PHACO Right 02/14/2013   Procedure: CATARACT EXTRACTION PHACO AND INTRAOCULAR LENS PLACEMENT (IOC);  Surgeon: Shade FloodGreer Geiger, MD;  [facility] OR;  Service: Ophthalmology;  Laterality: Right;  . CATARACT EXTRACTION W/PHACO Left 06/13/2013   Procedure: CATARACT EXTRACTION PHACO AND INTRAOCULAR LENS PLACEMENT (IOC);  Surgeon: Shade FloodGreer Geiger, MD;  Location: [facility] OR;  Service: Ophthalmology;  Laterality: Left;  . CIRCUMCISION  20 yrs. ago  . EYE SURGERY          Home Medications    Prior to Admission medications   Medication Sig Start Date End Date Taking? Authorizing Provider  acetaminophen (TYLENOL) 500 MG tablet Take 2 tablets (1,000 mg total) by mouth every 6 (six) hours as needed for mild pain or fever. 03/25/14  Yes Rodolph Bongorey, Evan S, MD  amLODipine (NORVASC) 10 MG tablet Take 10 mg by mouth daily.   Yes [provider]  aspirin EC 81 MG tablet Take 81 mg by mouth daily.   Yes [provider]  cloNIDine (CATAPRES) 0.2 MG tablet Take 0.2 mg by mouth 2 (two) times daily. 06/08/17  Yes [provider]  clotrimazole-betamethasone (LOTRISONE) cream Apply 1 application topically 2 (two) times daily.   Yes [provider]  dextromethorphan (DELSYM) 30 MG/5ML liquid Take 30 mg by mouth daily as needed for cough.   Yes [provider]  fluticasone (FLONASE) 50 MCG/ACT nasal spray Place 2 sprays into both nostrils daily. 12/30/15  Yes English, Stephanie D, PA  glipiZIDE (GLUCOTROL XL) 10 MG 24 hr tablet Take 10 mg by mouth daily.   Yes [provider]  Guaifenesin (MUCINEX MAXIMUM STRENGTH) 1200 MG TB12 Take 1 tablet (1,200 mg total) by mouth every 12 (twelve) hours as needed. 12/30/15  Yes English, Stephanie D, PA  insulin aspart (NOVOLOG) 100 UNIT/ML injection Inject 0-15 Units into the skin 3 (three) times daily with meals. Patient taking differently: Inject 0-15  Units into the skin 3 (three) times daily as needed for high blood sugar. BS 151-200: Take 2 units, 201-250: Takes 4 units, 251-300: Takes 6 units, 301-350: Take 8 units, 351-400: Take 10 units above 400 Take 15 units then notify MD. 10/03/14  Yes Kathlen Mody, MD  ipratropium (ATROVENT) 0.06 % nasal spray Place 2 sprays into both nostrils 4 (four) times daily. 03/25/14  Yes Rodolph Bong, MD  Liraglutide (VICTOZA) 18 MG/3ML SOLN injection Inject 1.2 mg into the skin daily.    Yes [provider]  lisinopril (PRINIVIL,ZESTRIL) 40 MG tablet Take 40 mg by mouth daily.   Yes [provider]  lithium 300 MG tablet Take 1.5 tablets (450 mg total) by mouth every morning. Patient taking differently: Take 450 mg by mouth at bedtime.  10/03/14  Yes Kathlen Mody, MD  lithium 300 MG tablet Take 600 mg by mouth daily.   Yes [provider]  loratadine (CLARITIN) 10 MG tablet Take 10 mg by mouth daily.   Yes [provider]  metFORMIN (GLUCOPHAGE) 1000 MG tablet Take 1,000 mg by mouth 2 (two) times daily with a meal.   Yes [provider]  OLANZapine (ZYPREXA) 10 MG tablet Take 10 mg by mouth every morning.   Yes [provider]  OLANZapine (ZYPREXA) 20 MG tablet Take 20 mg by mouth at bedtime.   Yes [provider]  polyethylene glycol (MIRALAX / GLYCOLAX) packet Take 17 g by mouth daily.   Yes [provider]  traZODone (DESYREL) 50 MG tablet Take 50 mg by mouth at bedtime.    Yes [provider]  triamcinolone cream (KENALOG) 0.5 % Apply 1 application topically 2 (two) times daily as needed (rash).   Yes [provider]  benzonatate (TESSALON) 100 MG capsule Take 1-2 capsules (100-200 mg total) by mouth 3 (three) times daily as needed for cough. Patient not taking: Reported on 06/26/2017 12/30/15   Trena Platt D, PA  meloxicam (MOBIC) 7.5 MG tablet Take 1 tablet (7.5 mg total) by mouth 2 (two) times daily after a  meal. Patient not taking: Reported on 06/26/2017 04/16/16   Linna Hoff, MD  ondansetron (ZOFRAN ODT) 4 MG disintegrating tablet Take 1 tablet (4 mg total) by mouth every 8 (eight) hours as needed for up to 3 days for nausea or vomiting. 06/26/17 06/29/17  Jeanie Sewer, PA-C    Family History History reviewed. No pertinent family history.  Social History Social History   Tobacco Use  . Smoking status: Never Smoker  . Smokeless tobacco: Never Used  Substance Use Topics  . Alcohol use: No  . Drug use: No     Allergies   Patient has no known allergies.   Review of Systems Review of Systems  Constitutional: Negative for chills and  fever.  Respiratory: Positive for shortness of breath. Negative for cough.   Cardiovascular: Negative for chest pain.  Gastrointestinal: Positive for diarrhea, nausea and vomiting.  Genitourinary: Negative for dysuria, frequency and hematuria.  All other systems reviewed and are negative.    Physical Exam Updated Vital Signs BP (!) 164/88   Pulse (!) 107   Temp 98.7 F (37.1 C) (Oral)   Resp 16   Ht 5\' 4"  (1.626 m)   Wt 106.1 kg (234 lb)   SpO2 94%   BMI 40.17 kg/m    Physical Exam  Constitutional: He is oriented to person, place, and time. He appears well-developed and well-nourished. No distress.  HENT:  Head: Normocephalic and atraumatic.  Eyes: Conjunctivae are normal. Right eye exhibits no discharge. Left eye exhibits no discharge.  Neck: No JVD present. No tracheal deviation present.  Cardiovascular: Regular rhythm, normal heart sounds and intact distal pulses.  Tachycardic, 2+ radial and DP/PT pulses bl, negative Homan's bl   Pulmonary/Chest: Effort normal. He exhibits no tenderness.  Globally diminished breath sounds, equal rise and fall of chest, no increased work of breathing.  Speaking in full sentences without difficulty.  Abdominal: Soft. He exhibits distension. He exhibits no mass. There is no tenderness. There is no  guarding.  Hyperactive bowel sounds, abdomen is distended and tympanic.  No CVA tenderness.  Murphy sign absent, Rovsing's absent.  Musculoskeletal: He exhibits no edema.  Neurological: He is alert and oriented to person, place, and time.  Somewhat slow to answer questions but does answer questions appropriately and follows commands without difficulty.  Fluent speech, no facial droop.  No evidence of aphasia or dysarthria.  Skin: Skin is warm and dry. No erythema.  Psychiatric: He has a normal mood and affect. His behavior is normal.  Nursing note and vitals reviewed.    ED Treatments / Results  Labs (all labs ordered are listed, but only abnormal results are displayed) Labs Reviewed  LIPASE, BLOOD - Abnormal; Notable for the following components:      Result Value   Lipase 74 (*)    All other components within normal limits  COMPREHENSIVE METABOLIC PANEL - Abnormal; Notable for the following components:   Glucose, Bld 192 (*)    Creatinine, Ser 1.41 (*)    GFR calc non Af Amer 54 (*)    All other components within normal limits  CBC - Abnormal; Notable for the following components:   RBC 4.20 (*)    HCT 38.8 (*)    All other components within normal limits  URINALYSIS, ROUTINE W REFLEX MICROSCOPIC - Abnormal; Notable for the following components:   Color, Urine STRAW (*)    All other components within normal limits  LITHIUM LEVEL  I-STAT TROPONIN, ED    EKG None  Radiology Dg Chest 2 View  Result Date: 06/26/2017 CLINICAL DATA:  Shortness of breath and chest pain. EXAM: CHEST - 2 VIEW COMPARISON:  07/26/2014 FINDINGS: Midline trachea.  Normal heart size and mediastinal contours. Sharp costophrenic angles.  No pneumothorax.  Clear lungs. Lateral view degraded by patient arm position. IMPRESSION: No active cardiopulmonary disease. Electronically Signed   By: Jeronimo Greaves M.D.   On: 06/26/2017 13:02   Ct Abdomen Pelvis W Contrast  Result Date: 06/26/2017 CLINICAL DATA:   57 year old male with abdominal pain, distention and vomiting. EXAM: CT ABDOMEN AND PELVIS WITH CONTRAST TECHNIQUE: Multidetector CT imaging of the abdomen and pelvis was performed using the standard protocol following bolus administration of intravenous  contrast. CONTRAST:  ISOVUE-300 IOPAMIDOL (ISOVUE-300) INJECTION 61% COMPARISON:  None. FINDINGS: Lower chest: No acute abnormality. Hepatobiliary: Hepatic steatosis identified without focal hepatic abnormality. The gallbladder is unremarkable. No biliary dilatation. Pancreas: Unremarkable Spleen: Unremarkable Adrenals/Urinary Tract: The kidneys, adrenal glands and bladder are unremarkable. Stomach/Bowel: Stomach is within normal limits. Appendix appears normal. No evidence of bowel wall thickening, distention, or inflammatory changes. A moderate to large amount of stool within the RIGHT and transverse colon noted. Vascular/Lymphatic: No significant vascular findings are present. No enlarged abdominal or pelvic lymph nodes. Reproductive: Prostate unremarkable Other: No ascites, focal collection or pneumoperitoneum. A small LEFT inguinal hernia containing fat is present. Musculoskeletal: No acute or suspicious bony abnormalities. IMPRESSION: 1. No evidence of acute abnormality 2. Moderate to large amount of stool within the RIGHT and transverse colon without other bowel abnormality or evidence of bowel obstruction. Normal appendix. 3. Hepatic steatosis 4. Small LEFT inguinal hernia containing fat Electronically Signed   By: Harmon Pier M.D.   On: 06/26/2017 13:44    Procedures Procedures (including critical care time)  Medications Ordered in ED Medications  iopamidol (ISOVUE-300) 61 % injection 100 mL (100 mLs Intravenous Contrast Given 06/26/17 1306)  sodium chloride 0.9 % bolus 1,000 mL (0 mLs Intravenous Stopped 06/26/17 1500)     Initial Impression / Assessment and Plan / ED Course  I have reviewed the triage vital signs and the nursing  notes.  Pertinent labs & imaging results that were available during my care of the patient were reviewed by me and considered in my medical decision making (see chart for details).     Patient presents with history of nausea, vomiting, and diarrhea.  He apparently had one episode of blood-streaked emesis this morning at church.  He is afebrile, mildly tachycardic although on chart review this appears to be the patient's baseline.  He is nontoxic in appearance.  His abdomen is soft and nontender but somewhat distended.  He tells me that it has been this way for "a really long time ".  Lab work reviewed by me shows no leukocytosis, no significant anemia, no electrolyte abnormalities, LFTs within normal limits.  He has a mildly elevated lipase although this is not 3 times the upper limit of normal and I doubt pancreatitis in the absence of epigastric abdominal pain with very minimally elevated lipase levels.  He also does have a mildly elevated creatinine from his baseline and I suspect this is likely secondary to dehydration from emesis and diarrhea.  UA is not concerning for UTI or nephrolithiasis.  His lithium levels are therapeutic.  Troponin was obtained which was negative and I do not feel a repeat troponin is warranted given the patient is low risk and is not complaining of any chest pain.  His EKG is unchanged from last tracing with no evidence of ST segment abnormality or arrhythmia.  Chest x-ray reviewed by me showed no evidence of acute cardiopulmonary abnormality and CT scan of the abdomen and pelvis shows no evidence of acute abnormality but does show a moderate amount of stool within the colon as well as hepatic steatosis.  There is no evidence of obstruction, perforation, appendicitis, colitis, AAA, or other acute surgical abdominal pathology.  Patient has received multiple fluid boluses and on reevaluation he is resting comfortably.  He asks me when he can go home.  His abdomen remains nontender  after serial examinations and generally benign. Suspect hematemesis likely secondary to Mallory-Weiss tear and not likely to be due  to esophageal varies or [place]ave. He has not vomited or had diarrhea once while in the ED. He is tolerating p.o. fluids without difficulty.  Will discharge with Zofran and instructions to advance his diet slowly and also try OTC laxatives to help with his stool burden.  Discussed strict ED return precautions. Pt verbalized understanding of and agreement with plan and is safe for discharge home at this time. He has no complaints prior to discharge.    Final Clinical Impressions(s) / ED Diagnoses   Final diagnoses:  Nausea vomiting and diarrhea    ED Discharge Orders        Ordered    ondansetron (ZOFRAN ODT) 4 MG disintegrating tablet  Every 8 hours PRN     06/26/17 1519      Jeanie Sewer, PA-C 06/28/17 1136  Mancel Bale, MD 06/29/17 2040

## 2017-06-26 NOTE — Discharge Instructions (Addendum)
1. Medications: Alternate 600 mg of ibuprofen and 500-1000 mg of Tylenol every 3 hours as needed for pain. Do not exceed 4000 mg of Tylenol daily.  Take ibuprofen with food to avoid upset stomach.  Take Zofran as needed for nausea.  Wait around 20 minutes before eating or drinking after taking this medication. °2. Treatment: rest, drink plenty of fluids, advance diet slowly.  Start with water and broth then advance to bland foods that will not upset your stomach such as crackers, mashed potatoes, and peanut butter. °3. Follow Up: Please followup with your primary doctor in 3 days for discussion of your diagnoses and further evaluation after today's visit; if you do not have a primary care doctor use the resource guide provided to find one; Please return to the ER for persistent vomiting, high fevers or worsening symptoms ° °

## 2017-06-26 NOTE — ED Notes (Signed)
Bed: WA04 Expected date:  Expected time:  Means of arrival:  Comments: 57 yo N/V

## 2017-12-30 ENCOUNTER — Emergency Department (HOSPITAL_COMMUNITY)
Admission: EM | Admit: 2017-12-30 | Discharge: 2017-12-30 | Discharge status: Against Medical Advice | Payer: Medicare Other

## 2017-12-30 LAB — CBG MONITORING, ED: Glucose-Capillary: 145 mg/dL — ABNORMAL HIGH (ref 70–99)

## 2017-12-30 NOTE — ED Notes (Signed)
Patient reports he does not want to be seen by anyone, just wants sugar checked.

## 2017-12-31 ENCOUNTER — Emergency Department (HOSPITAL_COMMUNITY)
Admission: EM | Admit: 2017-12-31 | Discharge: 2018-01-02 | Disposition: A | Discharge status: Home / Self Care | Payer: Medicare Other | Attending: Emergency Medicine | Admitting: Emergency Medicine

## 2017-12-31 ENCOUNTER — Encounter (HOSPITAL_COMMUNITY): Payer: Self-pay | Admitting: *Deleted

## 2017-12-31 ENCOUNTER — Other Ambulatory Visit: Payer: Self-pay

## 2017-12-31 DIAGNOSIS — I10 Essential (primary) hypertension: Secondary | ICD-10-CM | POA: Insufficient documentation

## 2017-12-31 DIAGNOSIS — E119 Type 2 diabetes mellitus without complications: Secondary | ICD-10-CM | POA: Diagnosis not present

## 2017-12-31 DIAGNOSIS — F29 Unspecified psychosis not due to a substance or known physiological condition: Secondary | ICD-10-CM | POA: Insufficient documentation

## 2017-12-31 DIAGNOSIS — F250 Schizoaffective disorder, bipolar type: Secondary | ICD-10-CM | POA: Diagnosis present

## 2017-12-31 DIAGNOSIS — Z79899 Other long term (current) drug therapy: Secondary | ICD-10-CM | POA: Insufficient documentation

## 2017-12-31 LAB — RAPID URINE DRUG SCREEN, HOSP PERFORMED
Amphetamines: NOT DETECTED
BARBITURATES: NOT DETECTED
BENZODIAZEPINES: NOT DETECTED
COCAINE: NOT DETECTED
Opiates: NOT DETECTED
Tetrahydrocannabinol: NOT DETECTED

## 2017-12-31 LAB — CBC WITH DIFFERENTIAL/PLATELET
Basophils Absolute: 0 10*3/uL (ref 0.0–0.1)
Basophils Relative: 0 %
EOS ABS: 0.2 10*3/uL (ref 0.0–0.7)
EOS PCT: 3 %
HCT: 39.5 % (ref 39.0–52.0)
HEMOGLOBIN: 13.7 g/dL (ref 13.0–17.0)
LYMPHS ABS: 2 10*3/uL (ref 0.7–4.0)
Lymphocytes Relative: 39 %
MCH: 31.7 pg (ref 26.0–34.0)
MCHC: 34.7 g/dL (ref 30.0–36.0)
MCV: 91.4 fL (ref 78.0–100.0)
MONOS PCT: 6 %
Monocytes Absolute: 0.3 10*3/uL (ref 0.1–1.0)
Neutro Abs: 2.6 10*3/uL (ref 1.7–7.7)
Neutrophils Relative %: 52 %
PLATELETS: 236 10*3/uL (ref 150–400)
RBC: 4.32 MIL/uL (ref 4.22–5.81)
RDW: 12.9 % (ref 11.5–15.5)
WBC: 5.1 10*3/uL (ref 4.0–10.5)

## 2017-12-31 LAB — COMPREHENSIVE METABOLIC PANEL
ALBUMIN: 4.3 g/dL (ref 3.5–5.0)
ALT: 44 U/L (ref 0–44)
AST: 53 U/L — AB (ref 15–41)
Alkaline Phosphatase: 108 U/L (ref 38–126)
Anion gap: 10 (ref 5–15)
BUN: 20 mg/dL (ref 6–20)
CHLORIDE: 106 mmol/L (ref 98–111)
CO2: 24 mmol/L (ref 22–32)
CREATININE: 1.18 mg/dL (ref 0.61–1.24)
Calcium: 9.3 mg/dL (ref 8.9–10.3)
GFR calc Af Amer: >60 mL/min (ref >60)
GFR calc non Af Amer: >60 mL/min (ref >60)
Glucose, Bld: 199 mg/dL — ABNORMAL HIGH (ref 70–99)
Potassium: 4 mmol/L (ref 3.5–5.1)
SODIUM: 140 mmol/L (ref 135–145)
Total Bilirubin: 0.6 mg/dL (ref 0.3–1.2)
Total Protein: 7.4 g/dL (ref 6.5–8.1)

## 2017-12-31 LAB — LITHIUM LEVEL: LITHIUM LVL: 0 mmol/L — AB (ref 0.60–1.20)

## 2017-12-31 LAB — CBG MONITORING, ED: GLUCOSE-CAPILLARY: 185 mg/dL — AB (ref 70–99)

## 2017-12-31 LAB — ETHANOL: Alcohol, Ethyl (B): <10 mg/dL (ref <10)

## 2017-12-31 MED ORDER — OLANZAPINE 10 MG PO TABS
20.0000 mg | ORAL_TABLET | Freq: Every day | ORAL | Status: DC
  Administered 2017-12-31: 20 mg via ORAL
  Filled 2017-12-31: qty 2

## 2017-12-31 MED ORDER — LORATADINE 10 MG PO TABS
10.0000 mg | ORAL_TABLET | Freq: Every day | ORAL | Status: DC
  Administered 2017-12-31 – 2018-01-02 (×3): 10 mg via ORAL
  Filled 2017-12-31 (×3): qty 1

## 2017-12-31 MED ORDER — INSULIN ASPART 100 UNIT/ML ~~LOC~~ SOLN: 0.0000 [IU] | Freq: Three times a day (TID) | SUBCUTANEOUS | Status: DC | PRN

## 2017-12-31 MED ORDER — GLIPIZIDE ER 10 MG PO TB24
10.0000 mg | ORAL_TABLET | Freq: Every day | ORAL | Status: DC
  Administered 2018-01-01 – 2018-01-02 (×2): 10 mg via ORAL
  Filled 2017-12-31 (×2): qty 1

## 2017-12-31 MED ORDER — FLUTICASONE PROPIONATE 50 MCG/ACT NA SUSP
2.0000 | Freq: Every day | NASAL | Status: DC
  Administered 2017-12-31 – 2018-01-02 (×3): 2 via NASAL
  Filled 2017-12-31: qty 16

## 2017-12-31 MED ORDER — LIRAGLUTIDE 18 MG/3ML ~~LOC~~ SOPN: 1.2000 mg | PEN_INJECTOR | Freq: Every day | SUBCUTANEOUS | Status: DC

## 2017-12-31 MED ORDER — METFORMIN HCL 500 MG PO TABS
1000.0000 mg | ORAL_TABLET | Freq: Two times a day (BID) | ORAL | Status: DC
  Administered 2017-12-31 – 2018-01-02 (×4): 1000 mg via ORAL
  Filled 2017-12-31 (×4): qty 2

## 2017-12-31 MED ORDER — LITHIUM CARBONATE 300 MG PO CAPS: 300.0000 mg | ORAL_CAPSULE | Freq: Every day | ORAL | Status: DC

## 2017-12-31 MED ORDER — RISPERIDONE 1 MG PO TBDP: 2.0000 mg | ORAL_TABLET | Freq: Three times a day (TID) | ORAL | Status: DC | PRN

## 2017-12-31 MED ORDER — AMLODIPINE BESYLATE 5 MG PO TABS
10.0000 mg | ORAL_TABLET | Freq: Every day | ORAL | Status: DC
  Administered 2017-12-31 – 2018-01-02 (×3): 10 mg via ORAL
  Filled 2017-12-31 (×3): qty 2

## 2017-12-31 MED ORDER — ASPIRIN EC 81 MG PO TBEC
81.0000 mg | DELAYED_RELEASE_TABLET | Freq: Every day | ORAL | Status: DC
  Administered 2017-12-31 – 2018-01-02 (×3): 81 mg via ORAL
  Filled 2017-12-31 (×3): qty 1

## 2017-12-31 MED ORDER — ACETAMINOPHEN 325 MG PO TABS: 650.0000 mg | ORAL_TABLET | ORAL | Status: DC | PRN

## 2017-12-31 MED ORDER — ONDANSETRON HCL 4 MG PO TABS: 4.0000 mg | ORAL_TABLET | Freq: Three times a day (TID) | ORAL | Status: DC | PRN

## 2017-12-31 MED ORDER — ZIPRASIDONE MESYLATE 20 MG IM SOLR: 20.0000 mg | INTRAMUSCULAR | Status: DC | PRN

## 2017-12-31 MED ORDER — LORAZEPAM 1 MG PO TABS: 1.0000 mg | ORAL_TABLET | ORAL | Status: DC | PRN

## 2017-12-31 MED ORDER — POLYETHYLENE GLYCOL 3350 17 G PO PACK
17.0000 g | PACK | Freq: Every day | ORAL | Status: DC
  Administered 2018-01-01 – 2018-01-02 (×2): 17 g via ORAL
  Filled 2017-12-31 (×3): qty 1

## 2017-12-31 MED ORDER — LITHIUM CARBONATE 300 MG PO CAPS
450.0000 mg | ORAL_CAPSULE | Freq: Every day | ORAL | Status: DC
  Administered 2017-12-31: 450 mg via ORAL
  Filled 2017-12-31: qty 1

## 2017-12-31 MED ORDER — CLONIDINE HCL 0.1 MG PO TABS
0.2000 mg | ORAL_TABLET | Freq: Two times a day (BID) | ORAL | Status: DC
  Administered 2017-12-31 – 2018-01-02 (×4): 0.2 mg via ORAL
  Filled 2017-12-31 (×4): qty 2

## 2017-12-31 MED ORDER — OLANZAPINE 10 MG PO TABS
10.0000 mg | ORAL_TABLET | ORAL | Status: DC
  Administered 2018-01-01: 10 mg via ORAL
  Filled 2017-12-31: qty 1

## 2017-12-31 MED ORDER — TRAZODONE HCL 100 MG PO TABS
100.0000 mg | ORAL_TABLET | Freq: Every day | ORAL | Status: DC
  Administered 2017-12-31 – 2018-01-01 (×2): 100 mg via ORAL
  Filled 2017-12-31 (×2): qty 1

## 2017-12-31 MED ORDER — LISINOPRIL 20 MG PO TABS
40.0000 mg | ORAL_TABLET | Freq: Every day | ORAL | Status: DC
  Administered 2017-12-31 – 2018-01-02 (×3): 40 mg via ORAL
  Filled 2017-12-31 (×3): qty 2

## 2017-12-31 NOTE — ED Notes (Signed)
Bed: WBH43 Expected date:  Expected time:  Means of arrival:  Comments: 

## 2017-12-31 NOTE — ED Notes (Signed)
Pt calm and cooperative on admission to Acute Unit. Able to do ADLS and make needs known. Stays in his room sitting on the bed watching TV or sleeping. No complaints voiced.

## 2017-12-31 NOTE — BH Assessment (Signed)
[facility] Assessment Progress Note Case was staffed with Shaune Pollack DNP who recommended patient be observed and monitored for safety. Patient will also be evlauted for possible medication interventions to assist with stabilization. Patient will be seen by psychiatry in the a.m.

## 2017-12-31 NOTE — BH Assessment (Addendum)
Assessment Note  Mitchell [name] is an 57 y.o. male that presents this date with IVC. Per IVC: "Respondent is hostile and aggressive and has been threatening group home residents. The respondent has been diagnosed with Schizophrenia, Bipolar and a chronic psychotic illness. The respondent has been prescribed multiple medications to assist with symptom management although is not currently compliant. The respondent is actively hallucinating and believes he is an agent for the police department and that his roommates are trying to harm him. The respondent may be using some controlled substance. The respondent is a danger to himself and others". Patient is observed to be speaking in a loud rapid voice and denies any S/I, H/I or AVH. Patient does not seem to be responding to any internal stimuli although is stating that he is "a agent for the police department." Patient is displaying some active thought blocking and renders limited conflicting history. Patient is oriented to place only and responds "I need to think about the rest" when ask in reference to date/day. Patient is irritable and states he is here "just to get my blood sugar checked." Patient does report a previous history of Schizophrenia stating he is currently prescribed medications by Renato Gails MD although he states that "he doesn't need them anymore." Patient denies any previous attempts/gestures at self harm or SA history. Patient's UDS is pending. Patient reports he is currently employed by [employer] as a Public affairs consultant although as stated above, believes he also is also employed by [employer]ch[employer], retail. Collateral information was obtained from group home at [facility] manager [phone] who reports that patient has been residing at that facility for over 4 years and has been doing well until he recently discontinued his medication/s over two weeks ago with aggressive behaviors escalating since then. Caryn Section reports that patient was reported  missing within the last 24 hours and just returned this morning stating he had been staying at a Dentist. Caryn Section reported that patient started getting angry with staff and made threats to residents and [place]. Law enforcement was contacted and IVC was initiated. Per history review, patient was last admitted to [facility] in 2012 when he presented with similar symptoms. Patient per notes, has a history of bipolar disorder and schizoaffective disorder. He also has medical history of diabetes and stroke. According to [employer], patient's group home called them to assess the patient after he returned to the group home after missing for a day.  Patient informs [place] that he was at work yesterday and that all he wants his get his blood sugar checked. Case was staffed with Shaune Pollack DNP who recommended patient be observed and monitored for safety. Patient will also be evlauted for possible medication interventions to assist with stabilization. Patient will be seen by psychiatry in the a.m.     Diagnosis: F25.0 Schizoaffective disorder, Bipolar type  Past Medical History:  Past Medical History:  Diagnosis Date  . Bipolar affective disorder (HCC)    takes Zyprexa daily  . Diabetes mellitus    takes Victoza,Metformin,and Glipizide daily  . Hypertension    takes Amlodipine,Lisinopril and Clonidine daily  . Hyponatremia    history of  . Mental disorder    takes Lithium daily  . Schizoaffective disorder    takes Trazodone nightly  . Seasonal allergies    takes Claritin daily  . Sleep apnea    sleep study >18yrs ago  . Stroke [facility])    left arm weakness    Past Surgical History:  Procedure Laterality Date  . CATARACT EXTRACTION W/PHACO Right 02/14/2013   Procedure: CATARACT EXTRACTION PHACO AND INTRAOCULAR LENS PLACEMENT (IOC);  Surgeon: Shade Flood, MD;  Location[facility] OR;  Service: Ophthalmology;  Laterality: Right;  . CATARACT EXTRACTION W/PHACO Left 06/13/2013   Procedure: CATARACT EXTRACTION PHACO AND INTRAOCULAR LENS  PLACEMENT (IOC);  Surgeon: Shade Flood, MD;  Location: [facility] OR;  Service: Ophthalmology;  Laterality: Left;  . CIRCUMCISION  20 yrs. ago  . EYE SURGERY      Family History: No family history on file.  Social History:  reports that he has never smoked. He has never used smokeless tobacco. He reports that he does not drink alcohol or use drugs.  Additional Social History:  Alcohol / Drug Use Pain Medications: See MAR Prescriptions: See MAR Over the Counter: See MAR History of alcohol / drug use?: No history of alcohol / drug abuse Longest period of sobriety (when/how long): NA Negative Consequences of Use: (NA) Withdrawal Symptoms: (NA)  CIWA: CIWA-Ar BP: (!) 160/99 Pulse Rate: (!) 110 COWS:    Allergies: No Known Allergies  Home Medications:  (Not in a hospital admission)  OB/GYN Status:  No LMP for male patient.  General Assessment Data Location of Assessment: [facility] ED TTS Assessment: In system Is this a Tele or Face-to-Face Assessment?: Face-to-Face Is this an Initial Assessment or a Re-assessment for this encounter?: Initial Assessment Patient Accompanied by:: (NA) Language Other than English: No Living Arrangements: (Group home) What gender do you identify as?: Male Marital status: Single Maiden name: NA Pregnancy Status: No Living Arrangements: Group Home Can pt return to current living arrangement?: Yes Admission Status: Involuntary(IVC in process per group home ) Petitioner: Other(Group home) Is patient capable of signing voluntary admission?: Yes Referral Source: Other(Group home ) Insurance type: Medicaid  Medical Screening Exam [facility] Walk-in ONLY) Medical Exam completed: Yes  Crisis Care Plan Living Arrangements: Group Home Legal Guardian: (NA) Name of Psychiatrist: None Name of Therapist: None  Education Status Is patient currently in school?: No Is the patient employed, unemployed or receiving disability?: Receiving disability income  Risk to self  with the past 6 months Suicidal Ideation: No Has patient been a risk to self within the past 6 months prior to admission? : No Suicidal Intent: No Has patient had any suicidal intent within the past 6 months prior to admission? : No Is patient at risk for suicide?: No Suicidal Plan?: No Has patient had any suicidal plan within the past 6 months prior to admission? : No Access to Means: No What has been your use of drugs/alcohol within the last 12 months?: Denies Previous Attempts/Gestures: No How many times?: 0 Other Self Harm Risks: (NA) Triggers for Past Attempts: Unknown Intentional Self Injurious Behavior: None Family Suicide History: No Recent stressful life event(s): Other (Comment)(Stress from living at group home) Persecutory voices/beliefs?: No Depression: No Depression Symptoms: (Pt denies) Substance abuse history and/or treatment for substance abuse?: No Suicide prevention information given to non-admitted patients: Not applicable  Risk to Others within the past 6 months Homicidal Ideation: No Does patient have any lifetime risk of violence toward others beyond the six months prior to admission? : Yes (comment) Thoughts of Harm to Others: Yes-Currently Present Comment - Thoughts of Harm to Others: Pt states he is currently wanting to "hurt" group home staff Current Homicidal Intent: No Current Homicidal Plan: No Access to Homicidal Means: No Identified Victim: Group home staff History of harm to others?: Yes Assessment of Violence: On admission  Violent Behavior Description: pt has made threats to group home staff Does patient have access to weapons?: No Criminal Charges Pending?: No Does patient have a court date: No Is patient on probation?: No  Psychosis Hallucinations: Auditory, Visual Delusions: None noted  Mental Status Report Appearance/Hygiene: In scrubs Eye Contact: Fair Motor Activity: Freedom of movement Speech: Rapid, Loud Level of Consciousness:  Irritable Mood: Anxious Affect: Irritable Anxiety Level: Moderate Thought Processes: Thought Blocking Judgement: Partial Orientation: Place Obsessive Compulsive Thoughts/Behaviors: None  Cognitive Functioning Concentration: Decreased Memory: Recent Impaired Is patient IDD: No Insight: Poor Impulse Control: Poor Appetite: Fair Have you had any weight changes? : No Change Sleep: No Change Total Hours of Sleep: 4 Vegetative Symptoms: None  ADLScreening [facility] Assessment Services) Patient's cognitive ability adequate to safely complete daily activities?: Yes Patient able to express need for assistance with ADLs?: Yes Independently performs ADLs?: Yes (appropriate for developmental age)  Prior Inpatient Therapy Prior Inpatient Therapy: Yes Prior Therapy Dates: 2012(Per notes) Prior Therapy Facilty/Provider(s): [facility] (Per notes) Reason for Treatment: MH issues  Prior Outpatient Therapy Prior Outpatient Therapy: Yes Prior Therapy Dates: Ongoing  Prior Therapy Facilty/Provider(s): [facility] MD(PCP) Reason for Treatment: Med mang Does patient have an ACCT team?: No Does patient have Intensive In-House Services?  : No Does patient have [facility] services? : No Does patient have P4CC services?: No  ADL Screening (condition at time of admission) Patient's cognitive ability adequate to safely complete daily activities?: Yes Is the patient deaf or have difficulty hearing?: No Does the patient have difficulty seeing, even when wearing glasses/contacts?: No Does the patient have difficulty concentrating, remembering, or making decisions?: Yes Patient able to express need for assistance with ADLs?: Yes Does the patient have difficulty dressing or bathing?: No Independently performs ADLs?: Yes (appropriate for developmental age) Does the patient have difficulty walking or climbing stairs?: No Weakness of Legs: None Weakness of Arms/Hands: None  Home Assistive Devices/Equipment Home  Assistive Devices/Equipment: None  Therapy Consults (therapy consults require a physician order) PT Evaluation Needed: No OT Evalulation Needed: No SLP Evaluation Needed: No Abuse/Neglect Assessment (Assessment to be complete while patient is alone) Physical Abuse: Denies Verbal Abuse: Denies Sexual Abuse: Denies Exploitation of patient/patient's resources: Denies Self-Neglect: Denies Values / Beliefs Cultural Requests During Hospitalization: None Spiritual Requests During Hospitalization: None Consults Spiritual Care Consult Needed: No Social Work Consult Needed: No Merchant navy officer (For Healthcare) Does Patient Have a Medical Advance Directive?: No Would patient like information on creating a medical advance directive?: No - Patient declined          Disposition: Case was staffed with Shaune Pollack DNP who recommended patient be observed and monitored for safety. Patient will also be evlauted for possible medication interventions to assist with stabilization. Patient will be seen by psychiatry in the a.m.   Disposition Initial Assessment Completed for this Encounter: Yes Disposition of Patient: (Observe and monitor for safety) Patient refused recommended treatment: No Mode of transportation if patient is discharged?: (Unk)  On Site Evaluation by:   Reviewed with Physician:    Alfredia Ferguson 12/31/2017 12:48 PM

## 2017-12-31 NOTE — ED Notes (Addendum)
Group home at [address] [facility] [phone]

## 2017-12-31 NOTE — ED Provider Notes (Signed)
[name]EMERGENC[facility] DEPT Provider Note   CSN: [id number] Arrival date & time: 12/31/17  4098     History   Chief Complaint Chief Complaint  Patient presents with  . Medical Clearance    HPI Mitchell [name] is a 57 y.o. male.  HPI 57 year old male brought into the emergency room by [employer] with concerns for change in behavior. Patient has history of bipolar disorder and schizoaffective disorder.  He also has medical history of diabetes and stroke.  According to [employer], patient's group home called them to assess the patient after he returned to the group home after missing for a day.  According to the group home manager, [name] who can be reached at [phone], patient was disheveled and not behaving appropriately.  Patient informs [place] that he was at work yesterday and that all he wants his get his blood sugar checked.  He denies any psychiatric history but admits to taking lithium every day.  Patient denies any chest pain, shortness of breath, headaches, abdominal pain.   Past Medical History:  Diagnosis Date  . Bipolar affective disorder (HCC)    takes Zyprexa daily  . Diabetes mellitus    takes Victoza,Metformin,and Glipizide daily  . Hypertension    takes Amlodipine,Lisinopril and Clonidine daily  . Hyponatremia    history of  . Mental disorder    takes Lithium daily  . Schizoaffective disorder    takes Trazodone nightly  . Seasonal allergies    takes Claritin daily  . Sleep apnea    sleep study >52yrs ago  . Stroke Mitchell [name])    left arm weakness    Patient Active Problem List   Diagnosis Date Noted  . 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 Coarse tremors 10/02/2014  . Hypertension   . Type 2 diabetes mellitus (HCC)   . Sleep apnea   . Hyponatremia 03/13/2011  . Schizoaffective disorder, bipolar type (HCC) 02/28/2011    Past Surgical History:  Procedure Laterality Date  . CATARACT EXTRACTION W/PHACO Right 02/14/2013   Procedure: CATARACT  EXTRACTION PHACO AND INTRAOCULAR LENS PLACEMENT (IOC);  Surgeon: Shade Flood, MD;  Location: [name];  Service: Ophthalmology;  Laterality: Right;  . CATARACT EXTRACTION W/PHACO Left 06/13/2013   Procedure: CATARACT EXTRACTION PHACO AND INTRAOCULAR LENS PLACEMENT (IOC);  Surgeon: Shade Flood, MD;  Location[name];  Service: Ophthalmology;  Laterality: Left;  . CIRCUMCISION  20 yrs. ago  . EYE SURGERY          Home Medications    Prior to Admission medications   Medication Sig Start Date End Date Taking? Authorizing Provider  acetaminophen (TYLENOL) 500 MG tablet Take 2 tablets (1,000 mg total) by mouth every 6 (six) hours as needed for mild pain or fever. 03/25/14   Rodolph Bong, MD  amLODipine (NORVASC) 10 MG tablet Take 10 mg by mouth daily.    [provider]  aspirin EC 81 MG tablet Take 81 mg by mouth daily.    [provider]  benzonatate (TESSALON) 100 MG capsule Take 1-2 capsules (100-200 mg total) by mouth 3 (three) times daily as needed for cough. Patient not taking: Reported on 06/26/2017 12/30/15   Trena Platt D, PA  cloNIDine (CATAPRES) 0.2 MG tablet Take 0.2 mg by mouth 2 (two) times daily. 06/08/17   [provider]  clotrimazole-betamethasone (LOTRISONE) cream Apply 1 application topically 2 (two) times daily.    [provider]  dextromethorphan (DELSYM) 30 MG/5ML liquid Take 30 mg by mouth  daily as needed for cough.    [provider]  fluticasone (FLONASE) 50 MCG/ACT nasal spray Place 2 sprays into both nostrils daily. 12/30/15   Trena Platt D, PA  glipiZIDE (GLUCOTROL XL) 10 MG 24 hr tablet Take 10 mg by mouth daily.    [provider]  Guaifenesin (MUCINEX MAXIMUM STRENGTH) 1200 MG TB12 Take 1 tablet (1,200 mg total) by mouth every 12 (twelve) hours as needed. 12/30/15   Trena Platt D, PA  insulin aspart (NOVOLOG) 100 UNIT/ML injection Inject 0-15 Units into the skin 3 (three) times daily with  meals. Patient taking differently: Inject 0-15 Units into the skin 3 (three) times daily as needed for high blood sugar. BS 151-200: Take 2 units, 201-250: Takes 4 units, 251-300: Takes 6 units, 301-350: Take 8 units, 351-400: Take 10 units above 400 Take 15 units then notify MD. 10/03/14   Kathlen Mody, MD  ipratropium (ATROVENT) 0.06 % nasal spray Place 2 sprays into both nostrils 4 (four) times daily. 03/25/14   Rodolph Bong, MD  Liraglutide (VICTOZA) 18 MG/3ML SOLN injection Inject 1.2 mg into the skin daily.     [provider]  lisinopril (PRINIVIL,ZESTRIL) 40 MG tablet Take 40 mg by mouth daily.    [provider]  lithium 300 MG tablet Take 1.5 tablets (450 mg total) by mouth every morning. Patient taking differently: Take 450 mg by mouth at bedtime.  10/03/14   Kathlen Mody, MD  lithium 300 MG tablet Take 600 mg by mouth daily.    [provider]  loratadine (CLARITIN) 10 MG tablet Take 10 mg by mouth daily.    [provider]  meloxicam (MOBIC) 7.5 MG tablet Take 1 tablet (7.5 mg total) by mouth 2 (two) times daily after a meal. Patient not taking: Reported on 06/26/2017 04/16/16   Linna Hoff, MD  metFORMIN (GLUCOPHAGE) 1000 MG tablet Take 1,000 mg by mouth 2 (two) times daily with a meal.    [provider]  OLANZapine (ZYPREXA) 10 MG tablet Take 10 mg by mouth every morning.    [provider]  OLANZapine (ZYPREXA) 20 MG tablet Take 20 mg by mouth at bedtime.    [provider]  polyethylene glycol (MIRALAX / GLYCOLAX) packet Take 17 g by mouth daily.    [provider]  traZODone (DESYREL) 50 MG tablet Take 50 mg by mouth at bedtime.     [provider]  triamcinolone cream (KENALOG) 0.5 % Apply 1 application topically 2 (two) times daily as needed (rash).    [provider]    Family History No family history on file.  Social History Social History   Tobacco Use  . Smoking status: Never  Smoker  . Smokeless tobacco: Never Used  Substance Use Topics  . Alcohol use: No  . Drug use: No     Allergies   Patient has no known allergies.   Review of Systems Review of Systems  Unable to perform ROS: Psychiatric disorder  Constitutional: Positive for activity change.  Respiratory: Negative for shortness of breath.   Cardiovascular: Negative for chest pain.  Gastrointestinal: Negative for abdominal pain.  Neurological: Negative for headaches.  Psychiatric/Behavioral: Positive for behavioral problems.     Physical Exam Updated Vital Signs BP (!) 160/99 (BP Location: Left Arm)   Pulse (!) 110   Temp 98.2 F (36.8 C) (Oral)   Resp 17   Ht 5\' 4"  (1.626 m)   SpO2 96%  BMI 40.17 kg/m   Physical Exam  Constitutional: He appears well-developed.  HENT:  Head: Atraumatic.  Neck: Neck supple.  Cardiovascular: Normal rate.  Pulmonary/Chest: Effort normal.  Neurological: He is alert.  Moving all 4 extremities and gross sensory exam is normal.  Skin: Skin is warm.  Psychiatric:  Tangential thinking, disorganized.  Nursing note and vitals reviewed.    ED Treatments / Results  Labs (all labs ordered are listed, but only abnormal results are displayed) Labs Reviewed  COMPREHENSIVE METABOLIC PANEL  CBC WITH DIFFERENTIAL/PLATELET  LITHIUM LEVEL  ETHANOL  RAPID URINE DRUG SCREEN, HOSP PERFORMED  CBG MONITORING, ED    EKG None  Radiology No results found.  Procedures Procedures (including critical care time)  Medications Ordered in ED Medications - No data to display   Initial Impression / Assessment and Plan / ED Course  I have reviewed the triage vital signs and the nursing notes.  Pertinent labs & imaging results that were available during my care of the patient were reviewed by me and considered in my medical decision making (see chart for details).     57 year old male comes in with chief complaint of behavioral change.  Patient has history  of psychiatry conditions, and he appears to have disorganized and tangential thinking.  I called the group home manager at the number left in the HPI, voicemail was left.  Patient does appear to be decompensated from psychiatric perspective. History and exam not suggestive of underlying medical problems.  Awaiting lab results.  Final Clinical Impressions(s) / ED Diagnoses   Final diagnoses:  Psychosis, unspecified psychosis type [facility])    ED Discharge Orders    None       Derwood Kaplan, MD 01/01/18 [phone]

## 2017-12-31 NOTE — ED Notes (Addendum)
Pt in room in bed at assessment.  Pt denies pain or discomfort, denies SI, HI and AVH.  Pt contracts for safety.  Pt sts he will not take his Victoza and did not bring any from home.  Note from pharmacy says medication can be d/c with MD order.

## 2017-12-31 NOTE — ED Triage Notes (Signed)
Pt brought in by [employer]. Pt states the group home was upset since he has a job and has moved out. They reported him as missing. Pt is alert and oriented x 4. 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

## 2018-01-01 DIAGNOSIS — F29 Unspecified psychosis not due to a substance or known physiological condition: Secondary | ICD-10-CM | POA: Diagnosis not present

## 2018-01-01 LAB — CBG MONITORING, ED
GLUCOSE-CAPILLARY: 150 mg/dL — AB (ref 70–99)
GLUCOSE-CAPILLARY: 174 mg/dL — AB (ref 70–99)
GLUCOSE-CAPILLARY: 163 mg/dL — AB (ref 70–99)
GLUCOSE-CAPILLARY: 224 mg/dL — AB (ref 70–99)
Glucose-Capillary: 309 mg/dL — ABNORMAL HIGH (ref 70–99)

## 2018-01-01 MED ORDER — INSULIN ASPART 100 UNIT/ML ~~LOC~~ SOLN
0.0000 [IU] | Freq: Three times a day (TID) | SUBCUTANEOUS | Status: DC
  Administered 2018-01-01: 11 [IU] via SUBCUTANEOUS
  Administered 2018-01-02: 3 [IU] via SUBCUTANEOUS
  Filled 2018-01-01 (×2): qty 1

## 2018-01-01 MED ORDER — LITHIUM CARBONATE ER 450 MG PO TBCR
450.0000 mg | EXTENDED_RELEASE_TABLET | Freq: Two times a day (BID) | ORAL | Status: DC
  Administered 2018-01-01 – 2018-01-02 (×3): 450 mg via ORAL
  Filled 2018-01-01 (×3): qty 1

## 2018-01-01 MED ORDER — ZIPRASIDONE HCL 20 MG PO CAPS
40.0000 mg | ORAL_CAPSULE | Freq: Two times a day (BID) | ORAL | Status: DC
  Administered 2018-01-01 – 2018-01-02 (×2): 40 mg via ORAL
  Filled 2018-01-01 (×2): qty 2

## 2018-01-01 NOTE — ED Notes (Signed)
Fall risk band put on, non skid socks

## 2018-01-01 NOTE — Consult Note (Addendum)
[facility] Consult   Reason for Consult: Hallucination and aggressive behavior Physician: EDP Patient Identification: LAQUINTON BIHM MRN:  [id number] Principal Diagnosis: Schizoaffective disorder, bipolar type [facility]) Diagnosis:   Patient Active Problem List   Diagnosis Date Noted  . Schizoaffective disorder, bipolar type ([place]) [F25.0] 02/28/2011    Priority: High  . Lithium toxicity [T56.891A] 10/02/2014  . Coarse tremors [G25.2] 10/02/2014  . Hypertension [I10]   . Type 2 diabetes mellitus ([place]) [E11.9]   . Sleep apnea [G47.30]   . Hyponatremia [E87.1] 03/13/2011    Total Time spent with patient: 45 minutes  Subjective:  Pt is 57 y o male that states "He left  group home and staying in hotel  Because he didn't feel comfortable there and he quit his medication because he felt like iIt made him blow up."  HPI: Per TTS:  vid STEPHANE NIEMANN is an 57 y.o. male that presents this date with IVC. Per IVC: "Respondent is hostile and aggressive and has been threatening group home residents."  The respondent has been diagnosed with Schizophrenia, Bipolar and a chronic psychotic illness. Collateral information was obtained from group home at [facility] Unit manager [phone] who reports that patient has been residing at that facility for over 4 years and has been doing well until he recently discontinued his medication/s over two weeks ago with aggressive behaviors escalating since then. Hassell Done reports that patient was reported missing within the last 24 hours and just returned this morning stating he had been staying at a local motel On exam today: Pt responding to questions but having thought  blocking and conflicted regarding story of why he is here.  "Pt states he is his own guardian and is not going back to the group home"   He is not taking prescribed medications due to weight gain. Pt is interested in starting a different medication report a previous.  He is  currently prescribed medications by Mariea Clonts MD.  Patient is oriented to place only and responds "I need to think about the rest" when ask in reference to date/day. Patient is quiet with and denies attempts/gestures at self harm or SA history. Patient's UDS is negative. 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 Denies active A/D/H. Pt unsure who case worker is or where he will live with discharge.  Not at his baseline at this time and need to coordinate with his care team prior to discharge. Past Psychiatric History:  Schizophrenia  Risk to Self: Suicidal Ideation: No Suicidal Intent: No Is patient at risk for suicide?: No Suicidal Plan?: No Access to Means: No What has been your use of drugs/alcohol within the last 12 months?: Denies How many times?: 0 Other Self Harm Risks: (NA) Triggers for Past Attempts: Unknown Intentional Self Injurious Behavior: None Risk to Others: No. Prior Inpatient Therapy: Prior Inpatient Therapy: Yes Prior Therapy Dates: 2012(Per notes) Prior Therapy Facilty/Provider(s): [facility] (Per notes) Reason for Treatment: MH issues Prior Outpatient Therapy: Prior Outpatient Therapy: Yes Prior Therapy Dates: Ongoing  Prior Therapy Facilty/Provider(s): Reed MD(PCP) Reason for Treatment: Med mang Does patient have an ACCT team?: No Does patient have Intensive In-House Services?  : No Does patient have [facility] services? : No Does patient have P4CC services?: No  Past Medical History:  Past Medical History:  Diagnosis Date  . Bipolar affective disorder ([place])    takes Zyprexa daily  . Diabetes mellitus    takes Victoza,Metformin,and Glipizide daily  . Hypertension  takes Amlodipine,Lisinopril and Clonidine daily  . Hyponatremia    history of  . Mental disorder    takes Lithium daily  . Schizoaffective disorder    takes Trazodone nightly  . Seasonal allergies    takes Claritin daily  . Sleep apnea    sleep study >82yr ago  . Stroke ([facility]     left arm weakness    Past Surgical History:  Procedure Laterality Date  . CATARACT EXTRACTION W/PHACO Right 02/14/2013   Procedure: CATARACT EXTRACTION PHACO AND INTRAOCULAR LENS PLACEMENT ([facility];  Surgeon: GAdonis Brook MD;  [facility]  Service: Ophthalmology;  Laterality: Right;  . CATARACT EXTRACTION W/PHACO Left 06/13/2013   Procedure: CATARACT EXTRACTION PHACO AND INTRAOCULAR LENS PLACEMENT (IOC);  Surgeon: GAdonis Brook MD;  [place] [facility]  Service: Ophthalmology;  Laterality: Left;  . CIRCUMCISION  20 yrs. ago  . EYE SURGERY     Family History: No family history on file. Family Psychiatric  History: unsure Social History:  Social History   Substance and Sexual Activity  Alcohol Use No     Social History   Substance and Sexual Activity  Drug Use No    Social History   Socioeconomic History  . Marital status: Divorced    Spouse name: Not on file  . Number of children: Not on file  . Years of education: Not on file  . Highest education level: Not on file  Occupational History  . Not on file  Social Needs  . Financial resource strain: Not on file  . Food insecurity:    Worry: Not on file    Inability: Not on file  . Transportation needs:    Medical: Not on file    Non-medical: Not on file  Tobacco Use  . Smoking status: Never Smoker  . Smokeless tobacco: Never Used  Substance and Sexual Activity  . Alcohol use: No  . Drug use: No  . Sexual activity: Yes    Birth control/protection: None  Lifestyle  . Physical activity:    Days per week: Not on file    Minutes per session: Not on file  . Stress: Not on file  Relationships  . Social connections:    Talks on phone: Not on file    Gets together: Not on file    Attends religious service: Not on file    Active member of club or organization: Not on file    Attends meetings of clubs or organizations: Not on file    Relationship status: Not on file  Other Topics Concern  . Not on file  Social  History Narrative  . Not on file   Additional Social History:    Allergies:  No Known Allergies  Labs:  Results for orders placed or performed during the hospital encounter of 12/31/17 (from the past 48 hour(s))  Ethanol     Status: None   Collection Time: 12/31/17  9:20 AM  Result Value Ref Range   Alcohol, Ethyl (B) <10 <10 mg/dL    Comment: (NOTE) Lowest detectable limit for serum alcohol is 10 mg/dL. For medical purposes only. Performed at [facility] [address], [place] [id number]  Comprehensive metabolic panel     Status: Abnormal   Collection Time: 12/31/17  9:36 AM  Result Value Ref Range   Sodium 140 135 - 145 mmol/L   Potassium 4.0 3.5 - 5.1 mmol/L   Chloride 106 98 - 111 mmol/L   CO2 24 22 -  32 mmol/L   Glucose, Bld 199 (H) 70 - 99 mg/dL   BUN 20 6 - 20 mg/dL   Creatinine, Ser 1.18 0.61 - 1.24 mg/dL   Calcium 9.3 8.9 - 10.3 mg/dL   Total Protein 7.4 6.5 - 8.1 g/dL   Albumin 4.3 3.5 - 5.0 g/dL   AST 53 (H) 15 - 41 U/L   ALT 44 0 - 44 U/L   Alkaline Phosphatase 108 38 - 126 U/L   Total Bilirubin 0.6 0.3 - 1.2 mg/dL   GFR calc non Af Amer >60 >60 mL/min   GFR calc Af Amer >60 >60 mL/min    Comment: (NOTE) The eGFR has been calculated using the CKD EPI equation. This calculation has not been validated in all clinical situations. eGFR's persistently <60 mL/min signify possible Chronic Kidney Disease.    Anion gap 10 5 - 15    Comment: Performed at [facility], [place] [address] [place], [place] [address]  CBC with Differential     Status: None   Collection Time: 12/31/17  9:36 AM  Result Value Ref Range   WBC 5.1 4.0 - 10.5 K/uL   RBC 4.32 4.22 - 5.81 MIL/uL   Hemoglobin 13.7 13.0 - 17.0 g/dL   HCT 39.5 39.0 - 52.0 %   MCV 91.4 78.0 - 100.0 fL   MCH 31.7 26.0 - 34.0 pg   MCHC 34.7 30.0 - 36.0 g/dL   RDW 12.9 11.5 - 15.5 %   Platelets 236 150 - 400 K/uL   Neutrophils Relative % 52 %   Neutro Abs 2.6  1.7 - 7.7 K/uL   Lymphocytes Relative 39 %   Lymphs Abs 2.0 0.7 - 4.0 K/uL   Monocytes Relative 6 %   Monocytes Absolute 0.3 0.1 - 1.0 K/uL   Eosinophils Relative 3 %   Eosinophils Absolute 0.2 0.0 - 0.7 K/uL   Basophils Relative 0 %   Basophils Absolute 0.0 0.0 - 0.1 K/uL    Comment: Performed at [facility], [place] [address], [place], [place] [address]  Lithium level     Status: Abnormal   Collection Time: 12/31/17  9:36 AM  Result Value Ref Range   Lithium Lvl 0.00 (L) 0.60 - 1.20 mmol/L    Comment: Performed at [facility], [place] [address], [place], [place] [address]  Urine rapid drug screen (hosp performed)     Status: None   Collection Time: 12/31/17  9:36 AM  Result Value Ref Range   Opiates NONE DETECTED NONE DETECTED   Cocaine NONE DETECTED NONE DETECTED   Benzodiazepines NONE DETECTED NONE DETECTED   Amphetamines NONE DETECTED NONE DETECTED   Tetrahydrocannabinol NONE DETECTED NONE DETECTED   Barbiturates NONE DETECTED NONE DETECTED    Comment: (NOTE) DRUG SCREEN FOR MEDICAL PURPOSES ONLY.  IF CONFIRMATION IS NEEDED FOR ANY PURPOSE, NOTIFY LAB WITHIN 5 DAYS. LOWEST DETECTABLE LIMITS FOR URINE DRUG SCREEN Drug Class                     Cutoff (ng/mL) Amphetamine and metabolites    1000 Barbiturate and metabolites    200 Benzodiazepine                 889 Tricyclics and metabolites     300 Opiates and metabolites        300 Cocaine and metabolites        300 THC  50 Performed at [facility], [place] [address] [place], [place] [address]   POC CBG, ED     Status: Abnormal   Collection Time: 12/31/17  4:51 PM  Result Value Ref Range   Glucose-Capillary 185 (H) 70 - 99 mg/dL  CBG monitoring, ED     Status: Abnormal   Collection Time: 01/01/18  3:15 AM  Result Value Ref Range   Glucose-Capillary 150 (H) 70 - 99 mg/dL  CBG monitoring, ED     Status: Abnormal   Collection Time:  01/01/18  8:30 AM  Result Value Ref Range   Glucose-Capillary 174 (H) 70 - 99 mg/dL    Current Facility-Administered Medications  Medication Dose Route Frequency Provider Last Rate Last Dose  . acetaminophen (TYLENOL) tablet 650 mg  650 mg Oral Q4H PRN Nanavati, Ankit, MD      . amLODipine (NORVASC) tablet 10 mg  10 mg Oral Daily Patrecia Pour, NP   10 mg at 12/31/17 1728  . aspirin EC tablet 81 mg  81 mg Oral Daily Patrecia Pour, NP   81 mg at 12/31/17 1728  . cloNIDine (CATAPRES) tablet 0.2 mg  0.2 mg Oral BID Patrecia Pour, NP   0.2 mg at 12/31/17 1726  . fluticasone (FLONASE) 50 MCG/ACT nasal spray 2 spray  2 spray Each Nare Daily Patrecia Pour, NP   2 spray at 12/31/17 1726  . glipiZIDE (GLUCOTROL XL) 24 hr tablet 10 mg  10 mg Oral Q breakfast Patrecia Pour, NP   10 mg at 01/01/18 0848  . insulin aspart (novoLOG) injection 0-15 Units  0-15 Units Subcutaneous TID PRN Patrecia Pour, NP      . liraglutide (VICTOZA) SOPN 1.2 mg  1.2 mg Subcutaneous Daily Lord, Jamison Y, NP      . lisinopril (PRINIVIL,ZESTRIL) tablet 40 mg  40 mg Oral Daily Patrecia Pour, NP   40 mg at 12/31/17 1728  . lithium carbonate capsule 300 mg  300 mg Oral Daily Waylan Boga Y, NP      . lithium carbonate capsule 450 mg  450 mg Oral Q supper Patrecia Pour, NP   450 mg at 12/31/17 1726  . loratadine (CLARITIN) tablet 10 mg  10 mg Oral Daily Patrecia Pour, NP   10 mg at 12/31/17 1726  . risperiDONE (RISPERDAL M-TABS) disintegrating tablet 2 mg  2 mg Oral Q8H PRN Varney Biles, MD       And  . LORazepam (ATIVAN) tablet 1 mg  1 mg Oral PRN Varney Biles, MD       And  . ziprasidone (GEODON) injection 20 mg  20 mg Intramuscular PRN Nanavati, Ankit, MD      . metFORMIN (GLUCOPHAGE) tablet 1,000 mg  1,000 mg Oral BID WC Patrecia Pour, NP   1,000 mg at 01/01/18 0847  . OLANZapine (ZYPREXA) tablet 10 mg  10 mg Oral Aurora Mask, NP   10 mg at 01/01/18 0847  . OLANZapine (ZYPREXA) tablet 20  mg  20 mg Oral QHS Patrecia Pour, NP   20 mg at 12/31/17 2150  . ondansetron (ZOFRAN) tablet 4 mg  4 mg Oral Q8H PRN Nanavati, Ankit, MD      . polyethylene glycol (MIRALAX / GLYCOLAX) packet 17 g  17 g Oral Daily Waylan Boga Y, NP      . traZODone (DESYREL) tablet 100 mg  100 mg Oral QHS Patrecia Pour, NP  100 mg at 12/31/17 2150   Current Outpatient Medications  Medication Sig Dispense Refill  . amLODipine (NORVASC) 10 MG tablet Take 10 mg by mouth daily.    [place] Kitchen aspirin EC 81 MG tablet Take 81 mg by mouth daily.    . cloNIDine (CATAPRES) 0.2 MG tablet Take 0.2 mg by mouth 2 (two) times daily.    . fluticasone (FLONASE) 50 MCG/ACT nasal spray Place 2 sprays into both nostrils daily. 16 g 2  . glipiZIDE (GLUCOTROL XL) 10 MG 24 hr tablet Take 10 mg by mouth daily.    . insulin aspart (NOVOLOG) 100 UNIT/ML injection Inject 0-15 Units into the skin 3 (three) times daily with meals. (Patient taking differently: Inject 0-15 Units into the skin 3 (three) times daily as needed for high blood sugar. BS 151-200: Take 2 units, 201-250: Takes 4 units, 251-300: Takes 6 units, 301-350: Take 8 units, 351-400: Take 10 units above 400 Take 15 units then notify MD.) 10 mL 11  . Liraglutide (VICTOZA) 18 MG/3ML SOLN injection Inject 1.2 mg into the skin daily.     [place] Kitchen lisinopril (PRINIVIL,ZESTRIL) 40 MG tablet Take 40 mg by mouth daily.    [place] Kitchen lithium 300 MG tablet Take 1.5 tablets (450 mg total) by mouth every morning. (Patient taking differently: Take 300-450 mg by mouth 2 (two) times daily. Take 300 mg in the AM and 450 mg QHS)    . loratadine (CLARITIN) 10 MG tablet Take 10 mg by mouth daily.    . metFORMIN (GLUCOPHAGE) 1000 MG tablet Take 1,000 mg by mouth 2 (two) times daily with a meal.    . OLANZapine (ZYPREXA) 10 MG tablet Take 10 mg by mouth every morning.    [place] Kitchen OLANZapine (ZYPREXA) 20 MG tablet Take 20 mg by mouth at bedtime.    . polyethylene glycol (MIRALAX / GLYCOLAX) packet Take 17 g by mouth daily.     . traZODone (DESYREL) 50 MG tablet Take 100 mg by mouth at bedtime.     [place] Kitchen acetaminophen (TYLENOL) 500 MG tablet Take 2 tablets (1,000 mg total) by mouth every 6 (six) hours as needed for mild pain or fever. (Patient not taking: Reported on 12/31/2017) 90 tablet 1  . benzonatate (TESSALON) 100 MG capsule Take 1-2 capsules (100-200 mg total) by mouth 3 (three) times daily as needed for cough. (Patient not taking: Reported on 06/26/2017) 40 capsule 0  . Guaifenesin (MUCINEX MAXIMUM STRENGTH) 1200 MG TB12 Take 1 tablet (1,200 mg total) by mouth every 12 (twelve) hours as needed. (Patient not taking: Reported on 12/31/2017) 14 tablet 1  . ipratropium (ATROVENT) 0.06 % nasal spray Place 2 sprays into both nostrils 4 (four) times daily. (Patient not taking: Reported on 12/31/2017) 15 mL 1  . meloxicam (MOBIC) 7.5 MG tablet Take 1 tablet (7.5 mg total) by mouth 2 (two) times daily after a meal. (Patient not taking: Reported on 06/26/2017) 30 tablet 1    Musculoskeletal: Strength & Muscle Tone: within normal limits Gait & Station:normal Patient leans: N/A  Psychiatric Specialty Exam: Physical Exam  Nursing note and vitals reviewed. Constitutional: He is oriented to person, place, and time. He appears well-developed and well-nourished.  HENT:  Head: Normocephalic.  Neck: Normal range of motion.  Respiratory: Effort normal.  Musculoskeletal: Normal range of motion.  Neurological: He is alert and oriented to person, place, and time.  Psychiatric: Thought content normal. His affect is blunt. His speech is delayed. He is slowed. Cognition and memory are impaired. He  expresses inappropriate judgment.    Review of Systems  All other systems reviewed and are negative.   Blood pressure 106/74, pulse 74, temperature 98.2 F (36.8 C), temperature source Oral, resp. rate 20, height '5\' 4"'  (1.626 m), SpO2 99 %.Body mass index is 40.17 kg/m.  General Appearance: Casual  Eye Contact:  Fair  Speech:  Blocked  and Garbled  Volume:  Normal  Mood:  Anxious  Affect: blunted and flat  Thought Process:  Circumstantial  Orientation:  Full (Time, Place, and Person)  Thought Content:  WDL  Suicidal Thoughts:  No  Homicidal Thoughts:  No  Memory:  Recent;   Good  Judgement:  Impaired  Insight:  Lacking  Psychomotor Activity:  Decreased  Concentration:  Concentration: Fair  Recall:  [employer] of Knowledge:  Fair  Language:  Good  Akathisia:  Negative  Handed:  Right  AIMS (if indicated):     Assets:  Desire for Improvement Social Support  ADL's:  Impaired  Cognition:  Impaired,  Moderate  Sleep:   6     Treatment Plan Summary: Daily contact with patient to assess and evaluate symptoms and progress in treatment and Medication management  Schizoaffective disorder, bipolar type: -  Disposition: No evidence of imminent risk to self or others at present.    Waylan Boga, NP 01/01/2018 10:42 AM  Patient seen face-to-face for psychiatric evaluation, chart reviewed and case discussed with the physician extender and developed treatment plan. Reviewed the information documented and agree with the treatment plan. Corena Pilgrim, MD

## 2018-01-01 NOTE — ED Notes (Signed)
Pt A&O x 3, no distress noted, calm & cooperative at present.  Watching TV at present.  Pt is Diabetic, snack given.  Monitoring for safety, Q 15 min checks in effect.

## 2018-01-01 NOTE — ED Notes (Signed)
Pt up to bathroom.  Pt makes a loud noise and tech sees pt as unbalanced and pt taken top the floor.  Pt is alert and oriented to place and day.  Pt sts he falls often.  Pt bp was sightly low.  Pt sts he thinks it is the trazadone that affected him.  Pt's bed brought to hallway and pt placed in bed and returned to room.  Fall risk band placed on wrist.

## 2018-01-02 DIAGNOSIS — F29 Unspecified psychosis not due to a substance or known physiological condition: Secondary | ICD-10-CM | POA: Diagnosis not present

## 2018-01-02 DIAGNOSIS — F250 Schizoaffective disorder, bipolar type: Secondary | ICD-10-CM

## 2018-01-02 LAB — CBG MONITORING, ED: GLUCOSE-CAPILLARY: 170 mg/dL — AB (ref 70–99)

## 2018-01-02 MED ORDER — ZIPRASIDONE HCL 40 MG PO CAPS: 40.0000 mg | ORAL_CAPSULE | Freq: Two times a day (BID) | ORAL | 0 refills | Status: DC

## 2018-01-02 NOTE — BH Assessment (Signed)
[facility] Assessment Progress Note  PerJacqueline Norman, DO, this pt does not require psychiatric hospitalization at this time.Pt is to be discharged from [facility]. He is to return to his group home. Archie Balboa, LCSW agrees to facilitate this. Pt's nurse, Morrie Sheldon, has been notified.  Doylene Canning, MA Triage Specialist [phone]

## 2018-01-02 NOTE — [facility] Suicide Risk Assessment (Signed)
Suicide Risk Assessment  Discharge Assessment   [facility] Discharge Suicide Risk Assessment   Principal Problem: Schizoaffective disorder, bipolar type [facility]) Discharge Diagnoses:  Patient Active Problem List   Diagnosis Date Noted  . Schizoaffective disorder, bipolar type ([facility]) [F25.0] 02/28/2011    Priority: High  . Lithium toxicity [T56.891A] 10/02/2014  . Coarse tremors [G25.2] 10/02/2014  . Hypertension [I10]   . Type 2 diabetes mellitus (H[facility]) [E11.9]   . Sleep apnea [G47.30]   . Hyponatremia [E87.1] 03/13/2011    Total Time spent with patient: 30 minutes  Musculoskeletal: Strength & Muscle Tone: within normal limits Gait & Station:normal Patient leans: N/A  Psychiatric Specialty Exam: Physical Exam  Nursing note and vitals reviewed. Constitutional: He is oriented to person, place, and time. He appears well-developed and well-nourished.  HENT:  Head: Normocephalic.  Neck: Normal range of motion.  Respiratory: Effort normal.  Musculoskeletal: Normal range of motion.  Neurological: He is alert and oriented to person, place, and time.  Psychiatric: His speech is normal and behavior is normal. Judgment and thought content normal. His affect is blunt. Cognition and memory are impaired.    Review of Systems  All other systems reviewed and are negative.   Blood pressure 127/89, pulse 92, temperature 97.8 F (36.6 C), temperature source Oral, resp. rate 18, height 5\' 4"  (1.626 m), SpO2 99 %.Body mass index is 40.17 kg/m.  General Appearance: Casual  Eye Contact:  Fair  Speech:  Normal  Volume:  Normal  Mood:  Euthymic  Affect: blunted   Thought Process:  WDL  Orientation:  Full (Time, Place, and Person)  Thought Content:  WDL  Suicidal Thoughts:  No  Homicidal Thoughts:  No  Memory:  Recent;   Good  Judgement:  Fair  Insight:  Fair  Psychomotor Activity:  Decreased  Concentration:  Concentration: Fair  Recall:  Fair  Fund of Knowledge:  Fair  Language:  Good   Akathisia:  Negative  Handed:  Right  AIMS (if indicated):     Assets:  Desire for Improvement Social Support  ADL's:  Impaired  Cognition:  Impaired,  Moderate  Sleep:   6    Mental Status Per Nursing Assessment::   On Admission:   psychossi  Demographic Factors:  Male  Loss Factors: NA  Historical Factors: Impulsivity  Risk Reduction Factors:   Sense of responsibility to family, Living with another person, especially a relative, Positive social support and Positive therapeutic relationship  Continued Clinical Symptoms:  None   Cognitive Features That Contribute To Risk:  None    Suicide Risk:  Minimal: No identifiable suicidal ideation.  Patients presenting with no risk factors but with morbid ruminations; may be classified as minimal risk based on the severity of the depressive symptoms    Plan Of Care/Follow-up recommendations:  Activity:  as tolerated Diet:  heart healthy diet  Paola Aleshire, NP 01/02/2018, 10:19 AM

## 2018-01-02 NOTE — ED Notes (Signed)
Pt d/c home per MD order. Discharge summary reviewed with pt. Pt verbalizes understanding . RX provided. Pt denies SI/HI/AVH. Personal property returned. Pt signed e-signature. Pt ride in lobby. Ambulatory off unit with [facility].

## 2018-01-02 NOTE — Progress Notes (Signed)
CSW aware patient is psychiatrically and medically cleared for discharge. CSW aware patient has been staying at [facility] for the last 4 years. CSW reached out to [facility] regarding patient disposition. Per Misty Stanley, she would be here to pick up patient within the hour. CSW to update patient's RN.  Archie Balboa, LCSWA  Clinical Social Work Department  [employer]  [phone]

## 2018-01-02 NOTE — Consult Note (Addendum)
[facility] Face-to-Face Psychiatry Consult   Reason for Consult: Hallucinations and aggressive behavior Physician: EDP Patient Identification: Mitchell [name] MRN:  [id number] Principal Diagnosis: Schizoaffective disorder, bipolar type [facility]) Diagnosis:   Patient Active Problem List   Diagnosis Date Noted  . Schizoaffective disorder, bipolar type (HCC) [F25.0] 02/28/2011    Priority: High  . Lithium toxicity [T56.891A] 10/02/2014  . Coarse tremors [G25.2] 10/02/2014  . Hypertension [I10]   . Type 2 diabetes mellitus (HCC) [E11.9]   . Sleep apnea [G47.30]   . Hyponatremia [E87.1] 03/13/2011    Total Time spent with patient: 30 minutes  Subjective:  Pt is 30 y o male that states "He left  group home and staying in hotel because he didn't feel comfortable there and he quit his medication because he felt like it made him blow up."  HPI: Per TTS:  Mitchell [name] is an 57 y.o. male that presents this date with IVC. Per IVC: "Respondent is hostile and aggressive and has been threatening group home residents."  The respondent has been diagnosed with Schizophrenia, Bipolar and a chronic psychotic illness. Collateral information was obtained from group home at [facility] Unit manager [phone] who reports that patient has been residing at that facility for over 4 years and has been doing well until he recently discontinued his medications over two weeks ago with aggressive behaviors escalating since then. Caryn Section reports that patient was reported missing within the last 24 hours and just returned this morning stating he had been staying at a Dentist.  On exam today: Pt's medications were adjusted based on his weight concerns.  He is now at his baseline and functioning well.  No suicidal/homicidal ideations, hallucinations, or substance abuse.  Case manager notified to assist with his care after discharge.  Stable to discharge.  Past Psychiatric History: Schizophrenia  Risk to Self:  Suicidal Ideation: No Suicidal Intent: No Is patient at risk for suicide?: No Suicidal Plan?: No Access to Means: No What has been your use of drugs/alcohol within the last 12 months?: Denies How many times?: 0 Other Self Harm Risks: (NA) Triggers for Past Attempts: Unknown Intentional Self Injurious Behavior: None Risk to Others: None Prior Inpatient Therapy: Prior Inpatient Therapy: Yes Prior Therapy Dates: 2012(Per notes) Prior Therapy Facilty/Provider(s): [facility] (Per notes) Reason for Treatment: MH issues Prior Outpatient Therapy: Prior Outpatient Therapy: Yes Prior Therapy Dates: Ongoing  Prior Therapy Facilty/Provider(s): [facility](PCP) Reason for Treatment: Med mang Does patient have an ACCT team?: No Does patient have Intensive In-House Services?  : No Does patient have [facility] services? : No Does patient have P4CC services?: No  Past Medical History:  Past Medical History:  Diagnosis Date  . Bipolar affective disorder (HCC)    takes Zyprexa daily  . Diabetes mellitus    takes Victoza,Metformin,and Glipizide daily  . Hypertension    takes Amlodipine,Lisinopril and Clonidine daily  . Hyponatremia    history of  . Mental disorder    takes Lithium daily  . Schizoaffective disorder    takes Trazodone nightly  . Seasonal allergies    takes Claritin daily  . Sleep apnea    sleep study >25yrs ago  . Stroke [facility])    left arm weakness    Past Surgical History:  Procedure Laterality Date  . CATARACT EXTRACTION W/PHACO Right 02/14/2013   Procedure: CATARACT EXTRACTION PHACO AND INTRAOCULAR LENS PLACEMENT (IOC);  Surgeon: Shade Flood, MD;  Location: [facility] OR;  Service: Ophthalmology;  Laterality: Right;  .  CATARACT EXTRACTION W/PHACO Left 06/13/2013   Procedure: CATARACT EXTRACTION PHACO AND INTRAOCULAR LENS PLACEMENT (IOC);  Surgeon: Shade Flood, MD;  Location: [facility] OR;  Service: Ophthalmology;  Laterality: Left;  . CIRCUMCISION  20 yrs. ago  . EYE SURGERY     Family  History: No family history on file. Family Psychiatric  History: None per chart review. Social History:  Social History   Substance and Sexual Activity  Alcohol Use No     Social History   Substance and Sexual Activity  Drug Use No    Social History   Socioeconomic History  . Marital status: Divorced    Spouse name: Not on file  . Number of children: Not on file  . Years of education: Not on file  . Highest education level: Not on file  Occupational History  . Not on file  Social Needs  . Financial resource strain: Not on file  . Food insecurity:    Worry: Not on file    Inability: Not on file  . Transportation needs:    Medical: Not on file    Non-medical: Not on file  Tobacco Use  . Smoking status: Never Smoker  . Smokeless tobacco: Never Used  Substance and Sexual Activity  . Alcohol use: No  . Drug use: No  . Sexual activity: Yes    Birth control/protection: None  Lifestyle  . Physical activity:    Days per week: Not on file    Minutes per session: Not on file  . Stress: Not on file  Relationships  . Social connections:    Talks on phone: Not on file    Gets together: Not on file    Attends religious service: Not on file    Active member of club or organization: Not on file    Attends meetings of clubs or organizations: Not on file    Relationship status: Not on file  Other Topics Concern  . Not on file  Social History Narrative  . Not on file   Additional Social History: N/A    Allergies:  No Known Allergies  Labs:  Results for orders placed or performed during the hospital encounter of 12/31/17 (from the past 48 hour(s))  POC CBG, ED     Status: Abnormal   Collection Time: 12/31/17  4:51 PM  Result Value Ref Range   Glucose-Capillary 185 (H) 70 - 99 mg/dL  CBG monitoring, ED     Status: Abnormal   Collection Time: 01/01/18  3:15 AM  Result Value Ref Range   Glucose-Capillary 150 (H) 70 - 99 mg/dL  CBG monitoring, ED     Status: Abnormal    Collection Time: 01/01/18  8:30 AM  Result Value Ref Range   Glucose-Capillary 174 (H) 70 - 99 mg/dL  CBG monitoring, ED     Status: Abnormal   Collection Time: 01/01/18  2:40 PM  Result Value Ref Range   Glucose-Capillary 163 (H) 70 - 99 mg/dL  CBG monitoring, ED     Status: Abnormal   Collection Time: 01/01/18  6:33 PM  Result Value Ref Range   Glucose-Capillary 309 (H) 70 - 99 mg/dL  CBG monitoring, ED     Status: Abnormal   Collection Time: 01/01/18  8:45 PM  Result Value Ref Range   Glucose-Capillary 224 (H) 70 - 99 mg/dL  CBG monitoring, ED     Status: Abnormal   Collection Time: 01/02/18  7:26 AM  Result Value Ref Range   Glucose-Capillary  170 (H) 70 - 99 mg/dL    Current Facility-Administered Medications  Medication Dose Route Frequency Provider Last Rate Last Dose  . acetaminophen (TYLENOL) tablet 650 mg  650 mg Oral Q4H PRN Nanavati, Ankit, MD      . amLODipine (NORVASC) tablet 10 mg  10 mg Oral Daily Charm Rings, NP   10 mg at 01/02/18 0956  . aspirin EC tablet 81 mg  81 mg Oral Daily Charm Rings, NP   81 mg at 01/02/18 0956  . cloNIDine (CATAPRES) tablet 0.2 mg  0.2 mg Oral BID Charm Rings, NP   0.2 mg at 01/02/18 0956  . fluticasone (FLONASE) 50 MCG/ACT nasal spray 2 spray  2 spray Each Nare Daily Charm Rings, NP   2 spray at 01/02/18 0958  . glipiZIDE (GLUCOTROL XL) 24 hr tablet 10 mg  10 mg Oral Q breakfast Charm Rings, NP   10 mg at 01/02/18 0753  . insulin aspart (novoLOG) injection 0-15 Units  0-15 Units Subcutaneous TID WC Rolan Bucco, MD   3 Units at 01/02/18 0753  . liraglutide (VICTOZA) SOPN 1.2 mg  1.2 mg Subcutaneous Daily Lord, Jamison Y, NP      . lisinopril (PRINIVIL,ZESTRIL) tablet 40 mg  40 mg Oral Daily Charm Rings, NP   40 mg at 01/02/18 0957  . lithium carbonate (ESKALITH) CR tablet 450 mg  450 mg Oral Q12H Akintayo, Mojeed, MD   450 mg at 01/02/18 0956  . loratadine (CLARITIN) tablet 10 mg  10 mg Oral Daily Charm Rings,  NP   10 mg at 01/02/18 0956  . risperiDONE (RISPERDAL M-TABS) disintegrating tablet 2 mg  2 mg Oral Q8H PRN Derwood Kaplan, MD       And  . LORazepam (ATIVAN) tablet 1 mg  1 mg Oral PRN Derwood Kaplan, MD       And  . ziprasidone (GEODON) injection 20 mg  20 mg Intramuscular PRN Nanavati, Ankit, MD      . metFORMIN (GLUCOPHAGE) tablet 1,000 mg  1,000 mg Oral BID WC Charm Rings, NP   1,000 mg at 01/02/18 0754  . ondansetron (ZOFRAN) tablet 4 mg  4 mg Oral Q8H PRN Nanavati, Ankit, MD      . polyethylene glycol (MIRALAX / GLYCOLAX) packet 17 g  17 g Oral Daily Charm Rings, NP   17 g at 01/02/18 0955  . traZODone (DESYREL) tablet 100 mg  100 mg Oral QHS Charm Rings, NP   100 mg at 01/01/18 2123  . ziprasidone (GEODON) capsule 40 mg  40 mg Oral BID WC Charm Rings, NP   40 mg at 01/02/18 1610   Current Outpatient Medications  Medication Sig Dispense Refill  . amLODipine (NORVASC) 10 MG tablet Take 10 mg by mouth daily.    Marland Kitchen aspirin EC 81 MG tablet Take 81 mg by mouth daily.    . cloNIDine (CATAPRES) 0.2 MG tablet Take 0.2 mg by mouth 2 (two) times daily.    . fluticasone (FLONASE) 50 MCG/ACT nasal spray Place 2 sprays into both nostrils daily. 16 g 2  . glipiZIDE (GLUCOTROL XL) 10 MG 24 hr tablet Take 10 mg by mouth daily.    . insulin aspart (NOVOLOG) 100 UNIT/ML injection Inject 0-15 Units into the skin 3 (three) times daily with meals. (Patient taking differently: Inject 0-15 Units into the skin 3 (three) times daily as needed for high blood sugar. BS 151-200: Take 2  units, 201-250: Takes 4 units, 251-300: Takes 6 units, 301-350: Take 8 units, 351-400: Take 10 units above 400 Take 15 units then notify MD.) 10 mL 11  . Liraglutide (VICTOZA) 18 MG/3ML SOLN injection Inject 1.2 mg into the skin daily.     Marland Kitchen lisinopril (PRINIVIL,ZESTRIL) 40 MG tablet Take 40 mg by mouth daily.    Marland Kitchen lithium 300 MG tablet Take 1.5 tablets (450 mg total) by mouth every morning. (Patient taking  differently: Take 300-450 mg by mouth 2 (two) times daily. Take 300 mg in the AM and 450 mg QHS)    . loratadine (CLARITIN) 10 MG tablet Take 10 mg by mouth daily.    . metFORMIN (GLUCOPHAGE) 1000 MG tablet Take 1,000 mg by mouth 2 (two) times daily with a meal.    . OLANZapine (ZYPREXA) 10 MG tablet Take 10 mg by mouth every morning.    Marland Kitchen OLANZapine (ZYPREXA) 20 MG tablet Take 20 mg by mouth at bedtime.    . polyethylene glycol (MIRALAX / GLYCOLAX) packet Take 17 g by mouth daily.    . traZODone (DESYREL) 50 MG tablet Take 100 mg by mouth at bedtime.     Marland Kitchen acetaminophen (TYLENOL) 500 MG tablet Take 2 tablets (1,000 mg total) by mouth every 6 (six) hours as needed for mild pain or fever. (Patient not taking: Reported on 12/31/2017) 90 tablet 1  . benzonatate (TESSALON) 100 MG capsule Take 1-2 capsules (100-200 mg total) by mouth 3 (three) times daily as needed for cough. (Patient not taking: Reported on 06/26/2017) 40 capsule 0  . Guaifenesin (MUCINEX MAXIMUM STRENGTH) 1200 MG TB12 Take 1 tablet (1,200 mg total) by mouth every 12 (twelve) hours as needed. (Patient not taking: Reported on 12/31/2017) 14 tablet 1  . ipratropium (ATROVENT) 0.06 % nasal spray Place 2 sprays into both nostrils 4 (four) times daily. (Patient not taking: Reported on 12/31/2017) 15 mL 1  . meloxicam (MOBIC) 7.5 MG tablet Take 1 tablet (7.5 mg total) by mouth 2 (two) times daily after a meal. (Patient not taking: Reported on 06/26/2017) 30 tablet 1    Musculoskeletal: Strength & Muscle Tone: within normal limits Gait & Station:normal Patient leans: N/A  Psychiatric Specialty Exam: Physical Exam  Nursing note and vitals reviewed. Constitutional: He is oriented to person, place, and time. He appears well-developed and well-nourished.  HENT:  Head: Normocephalic and atraumatic.  Neck: Normal range of motion.  Respiratory: Effort normal.  Musculoskeletal: Normal range of motion.  Neurological: He is alert and oriented to  person, place, and time.  Psychiatric: His speech is normal and behavior is normal. Judgment and thought content normal. His affect is blunt. Cognition and memory are impaired.    Review of Systems  Psychiatric/Behavioral: Negative for suicidal ideas.  All other systems reviewed and are negative.   Blood pressure 127/89, pulse 92, temperature 97.8 F (36.6 C), temperature source Oral, resp. rate 18, height 5\' 4"  (1.626 m), SpO2 99 %.Body mass index is 40.17 kg/m.  General Appearance: Casual  Eye Contact:  Fair  Speech:  Normal rate and coherent.   Volume:  Normal  Mood:  Euthymic  Affect: blunted   Thought Process:  WDL and intact associations.   Orientation:  Full (Time, Place, and Person)  Thought Content:  WDL  Suicidal Thoughts:  No  Homicidal Thoughts:  No  Memory:  Recent;   Good  Judgement:  Fair  Insight:  Fair  Psychomotor Activity:  Decreased  Concentration:  Concentration: Fair  Recall:  Jennelle Human of Knowledge:  Fair  Language:  Good  Akathisia:  Negative  Handed:  Right  AIMS (if indicated):     Assets:  Desire for Improvement Social Support  ADL's:  Impaired  Cognition:  Impaired,  Moderate  Sleep:   6 hours nightly.      Treatment Plan Summary: Daily contact with patient to assess and evaluate symptoms and progress in treatment and Medication management  Schizoaffective disorder, bipolar type: Continue Geodon at 40 mg BID for psychosis Continue Lithium 300 mg in the am and 450 mg in the pm for mood stabilization Continue Trazodone 100 mg at bedtime for sleep  Disposition: No evidence of imminent risk to self or others at present.    Nanine Means, NP 01/02/2018 10:13 AM   Patient seen face-to-face for psychiatric evaluation, chart reviewed and case discussed with the physician extender and developed treatment plan. Reviewed the information documented and agree with the treatment plan.  Juanetta Beets, DO 01/02/18 7:28 PM

## 2018-03-30 ENCOUNTER — Ambulatory Visit (INDEPENDENT_AMBULATORY_CARE_PROVIDER_SITE_OTHER): Payer: Medicare Other | Admitting: Family Medicine

## 2018-03-30 ENCOUNTER — Encounter (INDEPENDENT_AMBULATORY_CARE_PROVIDER_SITE_OTHER): Payer: Self-pay

## 2018-07-18 ENCOUNTER — Inpatient Hospital Stay
Admission: AD | Admit: 2018-07-18 | Discharge: 2018-07-24 | DRG: 885 | Disposition: A | Discharge status: Home / Self Care | Payer: Medicare Other | Source: Intra-hospital | Attending: Psychiatry | Admitting: Psychiatry

## 2018-07-18 ENCOUNTER — Emergency Department (HOSPITAL_COMMUNITY)
Admission: EM | Admit: 2018-07-18 | Discharge: 2018-07-18 | Disposition: A | Discharge status: Other Acute Inpatient Hospital | Payer: Medicare Other | Attending: Emergency Medicine | Admitting: Emergency Medicine

## 2018-07-18 ENCOUNTER — Encounter (HOSPITAL_COMMUNITY): Payer: Self-pay | Admitting: Emergency Medicine

## 2018-07-18 ENCOUNTER — Other Ambulatory Visit: Payer: Self-pay

## 2018-07-18 DIAGNOSIS — Z915 Personal history of self-harm: Secondary | ICD-10-CM | POA: Diagnosis not present

## 2018-07-18 DIAGNOSIS — Z9119 Patient's noncompliance with other medical treatment and regimen: Secondary | ICD-10-CM

## 2018-07-18 DIAGNOSIS — Z7982 Long term (current) use of aspirin: Secondary | ICD-10-CM | POA: Diagnosis not present

## 2018-07-18 DIAGNOSIS — F250 Schizoaffective disorder, bipolar type: Secondary | ICD-10-CM | POA: Insufficient documentation

## 2018-07-18 DIAGNOSIS — Z7951 Long term (current) use of inhaled steroids: Secondary | ICD-10-CM

## 2018-07-18 DIAGNOSIS — Z8673 Personal history of transient ischemic attack (TIA), and cerebral infarction without residual deficits: Secondary | ICD-10-CM

## 2018-07-18 DIAGNOSIS — Z79899 Other long term (current) drug therapy: Secondary | ICD-10-CM | POA: Diagnosis not present

## 2018-07-18 DIAGNOSIS — F419 Anxiety disorder, unspecified: Secondary | ICD-10-CM | POA: Diagnosis present

## 2018-07-18 DIAGNOSIS — F203 Undifferentiated schizophrenia: Secondary | ICD-10-CM | POA: Diagnosis not present

## 2018-07-18 DIAGNOSIS — I10 Essential (primary) hypertension: Secondary | ICD-10-CM | POA: Diagnosis present

## 2018-07-18 DIAGNOSIS — Z794 Long term (current) use of insulin: Secondary | ICD-10-CM | POA: Diagnosis not present

## 2018-07-18 DIAGNOSIS — F209 Schizophrenia, unspecified: Secondary | ICD-10-CM | POA: Diagnosis present

## 2018-07-18 DIAGNOSIS — R4589 Other symptoms and signs involving emotional state: Secondary | ICD-10-CM

## 2018-07-18 DIAGNOSIS — R410 Disorientation, unspecified: Secondary | ICD-10-CM | POA: Diagnosis present

## 2018-07-18 DIAGNOSIS — E119 Type 2 diabetes mellitus without complications: Secondary | ICD-10-CM | POA: Insufficient documentation

## 2018-07-18 DIAGNOSIS — J069 Acute upper respiratory infection, unspecified: Secondary | ICD-10-CM

## 2018-07-18 LAB — COMPREHENSIVE METABOLIC PANEL
ALT: 30 U/L (ref 0–44)
AST: 21 U/L (ref 15–41)
Albumin: 4.3 g/dL (ref 3.5–5.0)
Alkaline Phosphatase: 128 U/L — ABNORMAL HIGH (ref 38–126)
Anion gap: 10 (ref 5–15)
BUN: 14 mg/dL (ref 6–20)
CO2: 24 mmol/L (ref 22–32)
Calcium: 9.1 mg/dL (ref 8.9–10.3)
Chloride: 97 mmol/L — ABNORMAL LOW (ref 98–111)
Creatinine, Ser: 1.33 mg/dL — ABNORMAL HIGH (ref 0.61–1.24)
GFR calc Af Amer: >60 mL/min (ref >60)
GFR calc non Af Amer: 59 mL/min — ABNORMAL LOW (ref >60)
Glucose, Bld: 487 mg/dL — ABNORMAL HIGH (ref 70–99)
Potassium: 4 mmol/L (ref 3.5–5.1)
Sodium: 131 mmol/L — ABNORMAL LOW (ref 135–145)
Total Bilirubin: 0.7 mg/dL (ref 0.3–1.2)
Total Protein: 8.1 g/dL (ref 6.5–8.1)

## 2018-07-18 LAB — CBC WITH DIFFERENTIAL/PLATELET
Abs Immature Granulocytes: 0 10*3/uL (ref 0.00–0.07)
Basophils Absolute: 0 10*3/uL (ref 0.0–0.1)
Basophils Relative: 1 %
Eosinophils Absolute: 0.1 10*3/uL (ref 0.0–0.5)
Eosinophils Relative: 1 %
HCT: 42.7 % (ref 39.0–52.0)
Hemoglobin: 14.6 g/dL (ref 13.0–17.0)
Immature Granulocytes: 0 %
Lymphocytes Relative: 30 %
Lymphs Abs: 1.5 10*3/uL (ref 0.7–4.0)
MCH: 31.6 pg (ref 26.0–34.0)
MCHC: 34.2 g/dL (ref 30.0–36.0)
MCV: 92.4 fL (ref 80.0–100.0)
Monocytes Absolute: 0.3 10*3/uL (ref 0.1–1.0)
Monocytes Relative: 7 %
Neutro Abs: 3 10*3/uL (ref 1.7–7.7)
Neutrophils Relative %: 61 %
Platelets: 267 10*3/uL (ref 150–400)
RBC: 4.62 MIL/uL (ref 4.22–5.81)
RDW: 12.4 % (ref 11.5–15.5)
WBC: 5 10*3/uL (ref 4.0–10.5)
nRBC: 0 % (ref 0.0–0.2)

## 2018-07-18 LAB — LIPID PANEL
Cholesterol: 151 mg/dL (ref 0–200)
HDL: 44 mg/dL (ref >40)
LDL Cholesterol: 83 mg/dL (ref 0–99)
Total CHOL/HDL Ratio: 3.4 RATIO
Triglycerides: 122 mg/dL (ref <150)
VLDL: 24 mg/dL (ref 0–40)

## 2018-07-18 LAB — RAPID URINE DRUG SCREEN, HOSP PERFORMED
Amphetamines: NOT DETECTED
Barbiturates: NOT DETECTED
Benzodiazepines: NOT DETECTED
Cocaine: NOT DETECTED
Opiates: NOT DETECTED
Tetrahydrocannabinol: NOT DETECTED

## 2018-07-18 LAB — CBG MONITORING, ED
Glucose-Capillary: 483 mg/dL — ABNORMAL HIGH (ref 70–99)
Glucose-Capillary: 327 mg/dL — ABNORMAL HIGH (ref 70–99)
Glucose-Capillary: 237 mg/dL — ABNORMAL HIGH (ref 70–99)
Glucose-Capillary: 260 mg/dL — ABNORMAL HIGH (ref 70–99)

## 2018-07-18 LAB — HEMOGLOBIN A1C
Hgb A1c MFr Bld: 12.6 % — ABNORMAL HIGH (ref 4.8–5.6)
Mean Plasma Glucose: 314.92 mg/dL

## 2018-07-18 LAB — LITHIUM LEVEL: Lithium Lvl: <0.06 mmol/L — ABNORMAL LOW (ref 0.60–1.20)

## 2018-07-18 LAB — ETHANOL: Alcohol, Ethyl (B): <10 mg/dL (ref <10)

## 2018-07-18 LAB — TSH: TSH: 1.793 u[IU]/mL (ref 0.350–4.500)

## 2018-07-18 LAB — GLUCOSE, CAPILLARY: Glucose-Capillary: 212 mg/dL — ABNORMAL HIGH (ref 70–99)

## 2018-07-18 MED ORDER — METFORMIN HCL 500 MG PO TABS
1000.0000 mg | ORAL_TABLET | Freq: Two times a day (BID) | ORAL | Status: DC
  Administered 2018-07-19 – 2018-07-24 (×11): 1000 mg via ORAL
  Filled 2018-07-18 (×11): qty 2

## 2018-07-18 MED ORDER — ZIPRASIDONE MESYLATE 20 MG IM SOLR: 20.0000 mg | Freq: Two times a day (BID) | INTRAMUSCULAR | Status: DC | PRN

## 2018-07-18 MED ORDER — ACETAMINOPHEN 325 MG PO TABS: 650.0000 mg | ORAL_TABLET | Freq: Four times a day (QID) | ORAL | Status: DC | PRN

## 2018-07-18 MED ORDER — ZIPRASIDONE HCL 20 MG PO CAPS
20.0000 mg | ORAL_CAPSULE | Freq: Two times a day (BID) | ORAL | Status: DC
  Administered 2018-07-18: 20 mg via ORAL
  Filled 2018-07-18: qty 1

## 2018-07-18 MED ORDER — FLUTICASONE PROPIONATE 50 MCG/ACT NA SUSP
2.0000 | Freq: Every day | NASAL | Status: DC
  Administered 2018-07-18 – 2018-07-24 (×6): 2 via NASAL
  Filled 2018-07-18: qty 16

## 2018-07-18 MED ORDER — LITHIUM CARBONATE 300 MG PO CAPS
300.0000 mg | ORAL_CAPSULE | Freq: Every morning | ORAL | Status: DC
  Administered 2018-07-19: 300 mg via ORAL
  Filled 2018-07-18: qty 1

## 2018-07-18 MED ORDER — AMLODIPINE BESYLATE 5 MG PO TABS
10.0000 mg | ORAL_TABLET | Freq: Every day | ORAL | Status: DC
  Administered 2018-07-19 – 2018-07-24 (×6): 10 mg via ORAL
  Filled 2018-07-18 (×6): qty 2

## 2018-07-18 MED ORDER — LIRAGLUTIDE 18 MG/3ML ~~LOC~~ SOLN: 1.2000 mg | Freq: Every day | SUBCUTANEOUS | Status: DC

## 2018-07-18 MED ORDER — AMLODIPINE BESYLATE 5 MG PO TABS
10.0000 mg | ORAL_TABLET | Freq: Every day | ORAL | Status: DC
  Administered 2018-07-18: 16:00:00 10 mg via ORAL
  Filled 2018-07-18: qty 2

## 2018-07-18 MED ORDER — INSULIN ASPART 100 UNIT/ML ~~LOC~~ SOLN
5.0000 [IU] | Freq: Once | SUBCUTANEOUS | Status: AC
  Administered 2018-07-18: 16:00:00 5 [IU] via SUBCUTANEOUS
  Filled 2018-07-18: qty 1

## 2018-07-18 MED ORDER — TRAZODONE HCL 100 MG PO TABS
100.0000 mg | ORAL_TABLET | Freq: Every day | ORAL | Status: DC
  Administered 2018-07-18: 22:00:00 100 mg via ORAL
  Filled 2018-07-18: qty 1

## 2018-07-18 MED ORDER — ASPIRIN EC 81 MG PO TBEC
81.0000 mg | DELAYED_RELEASE_TABLET | Freq: Every day | ORAL | Status: DC
  Administered 2018-07-19 – 2018-07-24 (×6): 81 mg via ORAL
  Filled 2018-07-18 (×6): qty 1

## 2018-07-18 MED ORDER — METFORMIN HCL 500 MG PO TABS: 1000.0000 mg | ORAL_TABLET | Freq: Two times a day (BID) | ORAL | Status: DC

## 2018-07-18 MED ORDER — INSULIN ASPART 100 UNIT/ML ~~LOC~~ SOLN
2.0000 [IU] | Freq: Once | SUBCUTANEOUS | Status: AC
  Administered 2018-07-18: 23:00:00 2 [IU] via SUBCUTANEOUS

## 2018-07-18 MED ORDER — ASPIRIN EC 81 MG PO TBEC
81.0000 mg | DELAYED_RELEASE_TABLET | Freq: Every day | ORAL | Status: DC
  Administered 2018-07-18: 16:00:00 81 mg via ORAL
  Filled 2018-07-18: qty 1

## 2018-07-18 MED ORDER — LORAZEPAM 1 MG PO TABS
1.0000 mg | ORAL_TABLET | ORAL | Status: DC | PRN
  Filled 2018-07-18: qty 1

## 2018-07-18 MED ORDER — INSULIN ASPART 100 UNIT/ML ~~LOC~~ SOLN
0.0000 [IU] | Freq: Three times a day (TID) | SUBCUTANEOUS | Status: DC | PRN
  Filled 2018-07-18: qty 1

## 2018-07-18 MED ORDER — METFORMIN HCL 500 MG PO TABS
1000.0000 mg | ORAL_TABLET | Freq: Two times a day (BID) | ORAL | Status: DC
  Administered 2018-07-18: 1000 mg via ORAL
  Filled 2018-07-18: qty 2

## 2018-07-18 MED ORDER — CLONIDINE HCL 0.1 MG PO TABS
0.2000 mg | ORAL_TABLET | Freq: Two times a day (BID) | ORAL | Status: DC
  Administered 2018-07-18 – 2018-07-24 (×12): 0.2 mg via ORAL
  Filled 2018-07-18 (×12): qty 2

## 2018-07-18 MED ORDER — INSULIN GLARGINE 100 UNIT/ML ~~LOC~~ SOLN
10.0000 [IU] | Freq: Once | SUBCUTANEOUS | Status: AC
  Administered 2018-07-18: 10 [IU] via SUBCUTANEOUS
  Filled 2018-07-18: qty 0.1

## 2018-07-18 MED ORDER — GLIPIZIDE ER 10 MG PO TB24
10.0000 mg | ORAL_TABLET | Freq: Every day | ORAL | Status: DC
  Administered 2018-07-18 – 2018-07-24 (×7): 10 mg via ORAL
  Filled 2018-07-18 (×7): qty 1

## 2018-07-18 MED ORDER — INSULIN ASPART 100 UNIT/ML ~~LOC~~ SOLN: 0.0000 [IU] | Freq: Three times a day (TID) | SUBCUTANEOUS | Status: DC

## 2018-07-18 MED ORDER — POLYETHYLENE GLYCOL 3350 17 G PO PACK
17.0000 g | PACK | Freq: Every day | ORAL | Status: DC
  Administered 2018-07-18 – 2018-07-24 (×7): 17 g via ORAL
  Filled 2018-07-18 (×6): qty 1

## 2018-07-18 MED ORDER — LITHIUM CARBONATE ER 450 MG PO TBCR
450.0000 mg | EXTENDED_RELEASE_TABLET | Freq: Every day | ORAL | Status: DC
  Administered 2018-07-18: 450 mg via ORAL
  Filled 2018-07-18: qty 1

## 2018-07-18 MED ORDER — LISINOPRIL 20 MG PO TABS
40.0000 mg | ORAL_TABLET | Freq: Every day | ORAL | Status: DC
  Administered 2018-07-19 – 2018-07-24 (×6): 40 mg via ORAL
  Filled 2018-07-18 (×6): qty 2

## 2018-07-18 MED ORDER — ZIPRASIDONE HCL 40 MG PO CAPS
40.0000 mg | ORAL_CAPSULE | Freq: Two times a day (BID) | ORAL | Status: DC
  Administered 2018-07-19 – 2018-07-24 (×11): 40 mg via ORAL
  Filled 2018-07-18 (×11): qty 1

## 2018-07-18 MED ORDER — ALUM & MAG HYDROXIDE-SIMETH 200-200-20 MG/5ML PO SUSP: 30.0000 mL | ORAL | Status: DC | PRN

## 2018-07-18 MED ORDER — ZIPRASIDONE HCL 20 MG PO CAPS: 20.0000 mg | ORAL_CAPSULE | Freq: Two times a day (BID) | ORAL | Status: DC

## 2018-07-18 MED ORDER — LORATADINE 10 MG PO TABS
10.0000 mg | ORAL_TABLET | Freq: Every day | ORAL | Status: DC
  Administered 2018-07-18 – 2018-07-24 (×7): 10 mg via ORAL
  Filled 2018-07-18 (×7): qty 1

## 2018-07-18 MED ORDER — MAGNESIUM HYDROXIDE 400 MG/5ML PO SUSP: 30.0000 mL | Freq: Every day | ORAL | Status: DC | PRN

## 2018-07-18 MED ORDER — INSULIN ASPART 100 UNIT/ML ~~LOC~~ SOLN
0.0000 [IU] | Freq: Three times a day (TID) | SUBCUTANEOUS | Status: DC
  Administered 2018-07-18: 18:00:00 5 [IU] via SUBCUTANEOUS
  Filled 2018-07-18: qty 1

## 2018-07-18 MED ORDER — LISINOPRIL 20 MG PO TABS
40.0000 mg | ORAL_TABLET | Freq: Every day | ORAL | Status: DC
  Administered 2018-07-18: 40 mg via ORAL
  Filled 2018-07-18: qty 2

## 2018-07-18 MED ORDER — HYDROXYZINE HCL 25 MG PO TABS
25.0000 mg | ORAL_TABLET | Freq: Three times a day (TID) | ORAL | Status: DC | PRN
  Administered 2018-07-21: 25 mg via ORAL
  Filled 2018-07-18 (×2): qty 1

## 2018-07-18 MED ORDER — INSULIN ASPART 100 UNIT/ML ~~LOC~~ SOLN
10.0000 [IU] | Freq: Once | SUBCUTANEOUS | Status: AC
  Administered 2018-07-18: 10 [IU] via SUBCUTANEOUS
  Filled 2018-07-18: qty 1

## 2018-07-18 NOTE — ED Notes (Signed)
Patient given soda, snack, and warm blanket.  Call bell at bedside.  Patient updated on plan of care.  Waiting for TTS.

## 2018-07-18 NOTE — ED Notes (Signed)
Shawn, PA at bedside. 

## 2018-07-18 NOTE — ED Notes (Signed)
Paged TTS to make aware Tele at bedside and patient ready.

## 2018-07-18 NOTE — ED Notes (Signed)
Pt ambulatory to restroom

## 2018-07-18 NOTE — ED Notes (Signed)
Spoke to pharmacy and they are in the process of updating patient medication now.

## 2018-07-18 NOTE — ED Notes (Signed)
Informed Shawn, PA that patients blood sugar is not below 300mg /dL which is hindering patient from being transported back to [facility].  PA states he will give additional insulin.

## 2018-07-18 NOTE — ED Notes (Signed)
Patient left via [employer]. Patient ambulated to the [facility] with two bags of belongings. Prior to patient leaving, spoke with Dr. Jacqulyn Bath about his blood pressure and to see if additional medication was needed. He states patient does not need any additional medication and he probably lives at this BP. If we were to drop his BP, he may have a stroke since this is probably his usual.

## 2018-07-18 NOTE — ED Notes (Signed)
Provided patient a diet coke. Will provide scheduled 17:00 insulin when meal arrives.

## 2018-07-18 NOTE — ED Provider Notes (Signed)
[facility]-EMERGENCY DEPT Provider Note   CSN: [id number] Arrival date & time: 07/18/18  0719    History   Chief Complaint Chief Complaint  Patient presents with  . wants to talk to somebody  . Schizophrenia    HPI Mitchell [name] is a 58 y.o. male.     HPI   Mitchell [name] is a 58 y.o. male, with a history of bipolar affective disorder, DM, HTN, schizoaffective disorder, stroke, presenting to the ED "wanting to talk to somebody." Patient has a rather lengthy story.  He states about 5 weeks ago he took in a young woman, let her stay with him, gave her a key, shared his food and resources.  He states he has become frustrated with some of her actions.  It has become more stressful with the stay at home orders. He states she has started to complain about "everything" including the TV channels, noises he makes, and his idiosyncrasies. He states, "I didn't know where to go or how I could go about talking with someone about my frustrations. I've always had a good relationship with the police because I am part of my neighborhood watch so that is why called the police today and they brought me here." Denies SI/HI.  He explicitly states he has no plans to harm himself or someone else.  Denies drug or alcohol use. He has not been taking any psychiatric medications stating he was told he did not need to. Denies chest pain, shortness of breath, abdominal pain, N/V/D, confusion, fever, cough, or any other complaints.    Past Medical History:  Diagnosis Date  . Bipolar affective disorder (HCC)    takes Zyprexa daily  . Diabetes mellitus    takes Victoza,Metformin,and Glipizide daily  . Hypertension    takes Amlodipine,Lisinopril and Clonidine daily  . Hyponatremia    history of  . Mental disorder    takes Lithium daily  . Schizoaffective disorder    takes Trazodone nightly  . Seasonal allergies    takes Claritin daily  . Sleep apnea    sleep study >26yrs ago  .  Stroke [facility])    left arm weakness    Patient Active Problem List   Diagnosis Date Noted  . 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 Coarse tremors 10/02/2014  . Hypertension   . Type 2 diabetes mellitus (HCC)   . Sleep apnea   . Hyponatremia 03/13/2011  . Schizoaffective disorder, bipolar type (HCC) 02/28/2011    Past Surgical History:  Procedure Laterality Date  . CATARACT EXTRACTION W/PHACO Right 02/14/2013   Procedure: CATARACT EXTRACTION PHACO AND INTRAOCULAR LENS PLACEMENT (IOC);  Surgeon: Shade Flood, MD;  Location: [facility] OR;  Service: Ophthalmology;  Laterality: Right;  . CATARACT EXTRACTION W/PHACO Left 06/13/2013   Procedure: CATARACT EXTRACTION PHACO AND INTRAOCULAR LENS PLACEMENT (IOC);  Surgeon: Shade Flood, MD;  Location: [facility] OR;  Service: Ophthalmology;  Laterality: Left;  . CIRCUMCISION  20 yrs. ago  . EYE SURGERY          Home Medications    Prior to Admission medications   Medication Sig Start Date End Date Taking? Authorizing Provider  amLODipine (NORVASC) 10 MG tablet Take 10 mg by mouth daily.    [provider]  aspirin EC 81 MG tablet Take 81 mg by mouth daily.    [provider]  cloNIDine (CATAPRES) 0.2 MG tablet Take 0.2 mg by mouth 2 (two) times daily. 06/08/17   [provider]  fluticasone (  FLONASE) 50 MCG/ACT nasal spray Place 2 sprays into both nostrils daily. 12/30/15   Trena PlattEnglish, Stephanie D, PA  glipiZIDE (GLUCOTROL XL) 10 MG 24 hr tablet Take 10 mg by mouth daily.    [provider]  insulin aspart (NOVOLOG) 100 UNIT/ML injection Inject 0-15 Units into the skin 3 (three) times daily with meals. Patient taking differently: Inject 0-15 Units into the skin 3 (three) times daily as needed for high blood sugar. BS 151-200: Take 2 units, 201-250: Takes 4 units, 251-300: Takes 6 units, 301-350: Take 8 units, 351-400: Take 10 units above 400 Take 15 units then notify MD. 10/03/14   Kathlen ModyAkula, Vijaya, MD  Liraglutide (VICTOZA) 18 MG/3ML  SOLN injection Inject 1.2 mg into the skin daily.     [provider]  lisinopril (PRINIVIL,ZESTRIL) 40 MG tablet Take 40 mg by mouth daily.    [provider]  lithium 300 MG tablet Take 1.5 tablets (450 mg total) by mouth every morning. Patient taking differently: Take 300 mg by mouth every morning. Take 300 mg in the AM and 450 mg QHS 10/03/14   Kathlen ModyAkula, Vijaya, MD  lithium carbonate (ESKALITH) 450 MG CR tablet Take 450 mg by mouth at bedtime. 04/28/18   [provider]  loratadine (CLARITIN) 10 MG tablet Take 10 mg by mouth daily.    [provider]  metFORMIN (GLUCOPHAGE) 1000 MG tablet Take 1,000 mg by mouth 2 (two) times daily with a meal.    [provider]  polyethylene glycol (MIRALAX / GLYCOLAX) packet Take 17 g by mouth daily.    [provider]  traZODone (DESYREL) 100 MG tablet Take 100 mg by mouth daily. 04/28/18   [provider]  ziprasidone (GEODON) 40 MG capsule Take 1 capsule (40 mg total) by mouth 2 (two) times daily with a meal. 01/02/18   Charm RingsLord, Jamison Y, NP    Family History History reviewed. No pertinent family history.  Social History Social History   Tobacco Use  . Smoking status: Never Smoker  . Smokeless tobacco: Never Used  Substance Use Topics  . Alcohol use: No  . Drug use: No     Allergies   Patient has no known allergies.   Review of Systems Review of Systems  Constitutional: Negative for chills, diaphoresis and fever.  Respiratory: Negative for cough and shortness of breath.   Cardiovascular: Negative for chest pain.  Gastrointestinal: Negative for abdominal pain, diarrhea, nausea and vomiting.  Neurological: Negative for dizziness, weakness, light-headedness, numbness and headaches.  Psychiatric/Behavioral: Positive for dysphoric mood. Negative for confusion, self-injury and suicidal ideas. The patient is nervous/anxious.   All other systems reviewed and are negative.    Physical  Exam Updated Vital Signs BP (!) 171/97 (BP Location: Right Arm)   Pulse (!) 106   Temp 97.9 F (36.6 C) (Oral)   Resp 19   SpO2 99%   Physical Exam Vitals signs and nursing note reviewed.  Constitutional:      General: He is not in acute distress.    Appearance: He is well-developed. He is not diaphoretic.  HENT:     Head: Normocephalic and atraumatic.     Mouth/Throat:     Mouth: Mucous membranes are moist.     Pharynx: Oropharynx is clear.  Eyes:     Conjunctiva/sclera: Conjunctivae normal.  Neck:     Musculoskeletal: Neck supple.  Cardiovascular:     Rate and Rhythm: Normal rate and regular rhythm.     Pulses: Normal  pulses.     Heart sounds: Normal heart sounds.     Comments: Tactile temperature in the extremities appropriate and equal bilaterally. Pulmonary:     Effort: Pulmonary effort is normal. No respiratory distress.     Breath sounds: Normal breath sounds.  Abdominal:     Palpations: Abdomen is soft.     Tenderness: There is no abdominal tenderness. There is no guarding.  Musculoskeletal:     Right lower leg: No edema.     Left lower leg: No edema.  Lymphadenopathy:     Cervical: No cervical adenopathy.  Skin:    General: Skin is warm and dry.  Neurological:     Mental Status: He is alert.  Psychiatric:        Mood and Affect: Mood is anxious.        Speech: Speech is tangential.        Behavior: Behavior normal.        Thought Content: Thought content does not include homicidal or suicidal ideation.     Comments: Patient seems a little anxious and speech is a little rapid and tangential, but overall his speech pattern seems to be consistent with someone who is under stress and is venting about his situation.       ED Treatments / Results  Labs (all labs ordered are listed, but only abnormal results are displayed) Labs Reviewed  COMPREHENSIVE METABOLIC PANEL - Abnormal; Notable for the following components:      Result Value   Sodium 131 (*)     Chloride 97 (*)    Glucose, Bld 487 (*)    Creatinine, Ser 1.33 (*)    Alkaline Phosphatase 128 (*)    GFR calc non Af Amer 59 (*)    All other components within normal limits  CBG MONITORING, ED - Abnormal; Notable for the following components:   Glucose-Capillary 483 (*)    All other components within normal limits  CBG MONITORING, ED - Abnormal; Notable for the following components:   Glucose-Capillary 327 (*)    All other components within normal limits  ETHANOL  RAPID URINE DRUG SCREEN, HOSP PERFORMED  CBC WITH DIFFERENTIAL/PLATELET  TSH  HEMOGLOBIN A1C  LIPID PANEL  LITHIUM LEVEL    EKG EKG Interpretation  Date/Time:  Tuesday July 18 2018 11:36:52 EDT Ventricular Rate:  103 PR Interval:    QRS Duration: 83 QT Interval:  335 QTC Calculation: 439 R Axis:   51 Text Interpretation:  Sinus tachycardia Posterior infarct, old No significant change since last tracing Confirmed by Alvira Monday ([id number]) on 07/18/2018 4:19:07 PM   Radiology No results found.  Procedures Procedures (including critical care time)  Medications Ordered in ED Medications  ziprasidone (GEODON) capsule 20 mg (has no administration in time range)  lisinopril (ZESTRIL) tablet 40 mg (40 mg Oral Given 07/18/18 1556)  metFORMIN (GLUCOPHAGE) tablet 1,000 mg (has no administration in time range)  insulin aspart (novoLOG) injection 0-15 Units (has no administration in time range)  amLODipine (NORVASC) tablet 10 mg (10 mg Oral Given 07/18/18 1556)  aspirin EC tablet 81 mg (81 mg Oral Given 07/18/18 1556)  insulin aspart (novoLOG) injection 10 Units (10 Units Subcutaneous Given 07/18/18 1252)  insulin aspart (novoLOG) injection 5 Units (5 Units Subcutaneous Given 07/18/18 1548)     Initial Impression / Assessment and Plan / ED Course  I have reviewed the triage vital signs and the nursing notes.  Pertinent labs & imaging results that were available during my  care of the patient were reviewed by me and  considered in my medical decision making (see chart for details).  Clinical Course as of Jul 17 1620  Tue Jul 18, 2018  1130 Spoke with patient regarding behavioral health recommendation for inpatient treatment.  At first, he expressed concerns about being able to care for his apartment, however, shortly thereafter he agreed to stay and states he has no problem doing so.   [SJ]  1230 Patient states he has not taken his insulin this morning. Last dose was last night.   Glucose(!): 487 [SJ]    Clinical Course User Index [SJ] Shivon Hackel C, PA-C       Patient presents with a request to "talk with somebody" about a stressful series of events in his life.  These events seem to be upsetting to him and he was hoping to speak with a counselor and obtain outpatient resources. Behavioral health counselor, in collaboration with Dr. Sharma Covert, recommend inpatient treatment.  After having a conversation with the patient, he agreed to continue to remain here voluntarily. He admitted that he probably does need to be here. He is aware that if he tries to leave it will result in IVC.  Delay in ordering home medications due to the patient's inability to confirm his current home medications.  For example, his medication list includes insulin, metformin, glipizide, and Victoza, however, patient states he is only taking insulin and cannot remember the last time he took the other 3 medications. Similarly, he has amlodipine, lisinopril, and clonidine on his medication list, states he has been taking one of these medications, but cannot remember which one. Based on his trending blood pressures, I ordered the amlodipine and lisinopril.  During the patient's stay, his managing team will have to decide if he needs the additional clonidine as well. He states he has not been taking his lithium, trazodone, or Geodon, therefore we will defer to psychiatry for reinitiating psychiatric medications.   Findings and plan of care  discussed with Alvira Monday, MD.    Final Clinical Impressions(s) / ED Diagnoses   Final diagnoses:  Dysphoric mood    ED Discharge Orders    None       Concepcion Living 07/18/18 1623    Alvira Monday, MD 07/19/18 [phone]

## 2018-07-18 NOTE — Tx Team (Signed)
Initial Treatment Plan 07/18/2018 8:49 PM Mitchell [name] BVA:[id number]    PATIENT STRESSORS: Financial difficulties Marital or family conflict Occupational concerns   PATIENT STRENGTHS: Capable of independent living Motivation for treatment/growth Supportive family/friends   PATIENT IDENTIFIED PROBLEMS: Depression/Anxiety    Inability to cope with life challenges     Family issues/ Stress             DISCHARGE CRITERIA:  Adequate post-discharge living arrangements Medical problems require only outpatient monitoring Motivation to continue treatment in a less acute level of care Need for constant or close observation no longer present  PRELIMINARY DISCHARGE PLAN: Attend PHP/IOP Attend 12-step recovery group Participate in family therapy Return to previous living arrangement  PATIENT/FAMILY INVOLVEMENT: This treatment plan has been presented to and reviewed with the patient, Mitchell [name],   The patient have been given the opportunity to ask questions and make suggestions.  Lelan Pons, RN 07/18/2018, 8:49 PM

## 2018-07-18 NOTE — Progress Notes (Signed)
Patient is a new admit , who was voluntarily admitted for severe depression and anxiety, patient reports having an argument with his girl friend who live with him at the time, patient has been diagnosed with schizophrenia and bipolar disorder, patient reports non compliant with his prescribed medications  Due to possible side effects to his body, .patient mood and affects is good and appropriate,  response is normal cooperative. Body search and skin check is done by two nurses and no contraband found and skin is clean no issues. Unit guide line and expected behaviors discussed, hygiene products are provided , unit and room orientation complete, cold tray and beverages provided , medication is given as ordered, patient denies any  Suicidal , homicidal ideation and denies ay hallucinations and no signs of delusions , patient did endorse depression at 7/10 scale noted support and education is provided , no distress.

## 2018-07-18 NOTE — ED Notes (Signed)
Informed Hina, PA that we need a specific order for how much insulin to give this patient for coverage.

## 2018-07-18 NOTE — BH Assessment (Addendum)
Tele Assessment Note   Patient Name: Mitchell [name] MRN: [id number] Referring Physician: Dalene Seltzer Location of Patient: [facility] ED Location of Provider: Behavioral Health TTS Department  Mitchell [name] is an 58 y.o. male presenting voluntarily to [facility] ED via [employer] because he "wanted to talk to someone." Patient is a poor historian due to AMS. Clinician utilized patient interview, chart review, and collateral information to complete assessment. Patient reports having an argument with a woman he is allowing to live with him. States that he is diagnosed with schizophrenia and bipolar disorder but that he does not take medications due to side effects. Patient's speech is tangential and he speaks about not liking men, this woman that lives with him having sex on his floor, and the people at the bus station want to have sex with children. Patient denies SI/HI/AVH. Patient reports he has a Veterinary surgeon through [employer] but cannot recall her name.  Collateral information was obtained from patient's caseworker at [facility], Lurline Hare [phone]. Collateral reports patient transitioned from the group home to independent living about 1 month ago and has decompensated. States that he is refusing medications, is delusional, and is experiencing AVH. She thinks he would benefit from an injectable to assist with compliance.   Diagnosis: F25.0 Schizoaffective disorder, bipolar type  Past Medical History:  Past Medical History:  Diagnosis Date  . Bipolar affective disorder (HCC)    takes Zyprexa daily  . Diabetes mellitus    takes Victoza,Metformin,and Glipizide daily  . Hypertension    takes Amlodipine,Lisinopril and Clonidine daily  . Hyponatremia    history of  . Mental disorder    takes Lithium daily  . Schizoaffective disorder    takes Trazodone nightly  . Seasonal allergies    takes Claritin daily  . Sleep apnea    sleep study >57yrs ago  . Stroke [facility])    left arm weakness    Past  Surgical History:  Procedure Laterality Date  . CATARACT EXTRACTION W/PHACO Right 02/14/2013   Procedure: CATARACT EXTRACTION PHACO AND INTRAOCULAR LENS PLACEMENT (IOC);  Surgeon: Shade Flood, MD;  Location[facility] OR;  Service: Ophthalmology;  Laterality: Right;  . CATARACT EXTRACTION W/PHACO Left 06/13/2013   Procedure: CATARACT EXTRACTION PHACO AND INTRAOCULAR LENS PLACEMENT (IOC);  Surgeon: Shade Flood, MD;  Location[facility] OR;  Service: Ophthalmology;  Laterality: Left;  . CIRCUMCISION  20 yrs. ago  . EYE SURGERY      Family History: No family history on file.  Social History:  reports that he has never smoked. He has never used smokeless tobacco. He reports that he does not drink alcohol or use drugs.  Additional Social History:  Alcohol / Drug Use Pain Medications: see MAR Prescriptions: see MAR Over the Counter: see MAR History of alcohol / drug use?: No history of alcohol / drug abuse  CIWA: CIWA-Ar BP: (!) 171/97 Pulse Rate: (!) 106 COWS:    Allergies: No Known Allergies  Home Medications: (Not in a hospital admission)   OB/GYN Status:  No LMP for male patient.  General Assessment Data Assessment unable to be completed: Yes Reason for not completing assessment: multiple assessments at one time Location of Assessment: [facility] ED TTS Assessment: In system Is this a Tele or Face-to-Face Assessment?: Tele Assessment Is this an Initial Assessment or a Re-assessment for this encounter?: Initial Assessment Patient Accompanied by:: N/A Language Other than English: No Living Arrangements: (apartment) What gender do you identify as?: Male Marital status: Single Maiden name: Paschen Pregnancy  Status: No Living Arrangements: Alone Can pt return to current living arrangement?: Yes Admission Status: Voluntary Is patient capable of signing voluntary admission?: No Referral Source: Self/Family/Friend Insurance type: Medicare     Crisis Care Plan Living Arrangements: Alone [facility]     Risk to self with the past 6 months Suicidal Ideation: No Has patient been a risk to self within the past 6 months prior to admission? : No Suicidal Intent: No Has patient had any suicidal intent within the past 6 months prior to admission? : No Is patient at risk for suicide?: No Suicidal Plan?: No Has patient had any suicidal plan within the past 6 months prior to admission? : No Access to Means: No What has been your use of drugs/alcohol within the last 12 months?: patient denies Previous Attempts/Gestures: No How many times?: 0 Other Self Harm Risks: none Triggers for Past Attempts: None known Intentional Self Injurious Behavior: None Family Suicide History: Unable to assess Recent stressful life event(s): (transition to independent living) Persecutory voices/beliefs?: Yes Depression: No Depression Symptoms: Feeling angry/irritable, Feeling worthless/self pity Substance abuse history and/or treatment for substance abuse?: No Suicide prevention information given to non-admitted patients: Not applicable  Risk to Others within the past 6 months Homicidal Ideation: No Does patient have any lifetime risk of violence toward others beyond the six months prior to admission? : No Thoughts of Harm to Others: No Current Homicidal Intent: No Current Homicidal Plan: No Access to Homicidal Means: No Identified Victim: none History of harm to others?: No Assessment of Violence: None Noted Violent Behavior Description: none Does patient have access to weapons?: No Criminal Charges Pending?: No Does patient have a court date: No Is patient on probation?: No  Psychosis Hallucinations: Auditory Delusions: Erotomanic, Persecutory, Jealous  Mental Status Report Appearance/Hygiene: Unremarkable Eye Contact: Fair Motor Activity: Freedom of movement Speech: Pressured, Rapid, Tangential Level of Consciousness:  Alert Mood: Anxious Affect: Anxious Anxiety Level: Moderate Thought Processes: Flight of Ideas, Irrelevant Judgement: Impaired Orientation: Person, Place, Time Obsessive Compulsive Thoughts/Behaviors: None  Cognitive Functioning Concentration: Poor Memory: Unable to Assess Is patient IDD: No Insight: Poor Impulse Control: Unable to Assess Appetite: (UTA) Have you had any weight changes? : (UTA) Sleep: Unable to Assess Total Hours of Sleep: (UTA) Vegetative Symptoms: None  ADLScreening [facility]) Patient's cognitive ability adequate to safely complete daily activities?: Yes Patient able to express need for assistance with ADLs?: No Independently performs ADLs?: Yes (appropriate for developmental age)  Prior Inpatient Therapy Prior Inpatient Therapy: Yes Prior Therapy Dates: 2012 [facility] Reason for Treatment: schizoaffective disorder  Prior Outpatient Therapy Prior Outpatient Therapy: Yes Prior Therapy Dates: ongoing [facility] Reason for Treatment: schizoaffective disorder Does patient have an ACCT team?: No Does patient have Intensive In-House Services?  : No Does patient have [facility] services? : No Does patient have P4CC services?: No  ADL Screening (condition at time of admission) Patient's cognitive ability adequate to safely complete daily activities?: Yes Is the patient deaf or have difficulty hearing?: No Does the patient have difficulty seeing, even when wearing glasses/contacts?: No Does the patient have difficulty concentrating, remembering, or making decisions?: Yes Patient able to express need for assistance with ADLs?: No Does the patient have difficulty dressing or bathing?: No Independently performs ADLs?: Yes (appropriate for developmental age) Does the patient have difficulty walking or climbing stairs?: No Weakness of Legs: None Weakness of Arms/Hands:  None  Home Assistive Devices/Equipment Home Assistive Devices/Equipment: None  Therapy Consults (therapy consults require a physician order) PT Evaluation Needed: No OT Evalulation Needed: No SLP Evaluation Needed: No Abuse/Neglect Assessment (Assessment to be complete while patient is alone) Abuse/Neglect Assessment Can Be Completed: Unable to assess, patient is non-responsive or altered mental status Values / Beliefs Cultural Requests During Hospitalization: None Spiritual Requests During Hospitalization: None Consults Spiritual Care Consult Needed: No Social Work Consult Needed: No Merchant navy officer (For Healthcare) Does Patient Have a Medical Advance Directive?: No          Disposition: Dr. Sharma Covert recommends in patient treatment. Disposition Initial Assessment Completed for this Encounter: Yes  This service was provided via telemedicine using a 2-way, interactive audio and video technology.  Names of all persons participating in this telemedicine service and their role in this encounter. Name: Mitchell [name] Role: patient  Name:Mitchell Fergusson Mayford Knife, LCSW Role: TTS  Name:  Role:   Name Role:     Celedonio Miyamoto 07/18/2018 11:00 AM

## 2018-07-18 NOTE — ED Notes (Signed)
Patient ambulated to restroom with no assist and no problems to provide UA.

## 2018-07-18 NOTE — ED Notes (Signed)
Patient dressed out in purple scrubs. Patient calm and cooperative.

## 2018-07-18 NOTE — ED Notes (Signed)
This RN can hear patient yelling on his cell phone to someone. This RN can hear yelling from desk.

## 2018-07-18 NOTE — ED Notes (Signed)
PA at bedside speaking with patient.

## 2018-07-18 NOTE — ED Notes (Signed)
Tele psych placed at bedside. 

## 2018-07-18 NOTE — BH Assessment (Signed)
Patient has been accepted to [facility].  Accepting physician is Dr. Viviano Simas.  Attending Physician will be Dr. Toni Amend.  Patient has been assigned to room 324, by [facility] Charge Nurse Independence F.  Call report to [phone].  Representative/Transfer Coordinator is Warden/ranger Patient pre-admitted by [facility] Patient Access Dedra Skeens S.)  [facility] ER Staff Baxter Hire, [facility]) made aware of acceptance.
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
## 2018-07-18 NOTE — ED Notes (Addendum)
Spoke with RN at [facility] observation unit and a Charity fundraiser at [facility] adult unit. Transferred this RN to [facility] [phone]. No Answer. Will try back.  At this time they are working on placement and not ready at this time.

## 2018-07-18 NOTE — ED Notes (Signed)
Patient given meal tray.

## 2018-07-18 NOTE — ED Notes (Addendum)
Gave report to Hulan Amato, RN at [facility] for room 324. Also, notified [facility] for patient.

## 2018-07-18 NOTE — Plan of Care (Signed)
Patient is seen patient chart is reviewed. 58 year old male with decompensated schizophrenia after step down to independent living approximately 1 month ago.  Patient refusing medication, delusional and having active auditory and visual hallucinations. Labs reviewed: Added lithium level, TSH, hemoglobin A1c, lipids to existing blood work for baseline. Admission orders placed with home medications restarted with further adjustments deferred to inpatient psychiatric treatment team.  Mariel Craft, MD

## 2018-07-19 DIAGNOSIS — F203 Undifferentiated schizophrenia: Secondary | ICD-10-CM

## 2018-07-19 LAB — GLUCOSE, CAPILLARY
Glucose-Capillary: 270 mg/dL — ABNORMAL HIGH (ref 70–99)
Glucose-Capillary: 397 mg/dL — ABNORMAL HIGH (ref 70–99)
Glucose-Capillary: 251 mg/dL — ABNORMAL HIGH (ref 70–99)
Glucose-Capillary: 131 mg/dL — ABNORMAL HIGH (ref 70–99)

## 2018-07-19 MED ORDER — OLANZAPINE 10 MG PO TABS: 20.0000 mg | ORAL_TABLET | Freq: Every day | ORAL | Status: DC

## 2018-07-19 MED ORDER — INSULIN ASPART 100 UNIT/ML ~~LOC~~ SOLN
0.0000 [IU] | Freq: Three times a day (TID) | SUBCUTANEOUS | Status: DC
  Administered 2018-07-19: 8 [IU] via SUBCUTANEOUS
  Administered 2018-07-19: 12:00:00 15 [IU] via SUBCUTANEOUS
  Administered 2018-07-20: 8 [IU] via SUBCUTANEOUS
  Administered 2018-07-20: 3 [IU] via SUBCUTANEOUS
  Administered 2018-07-21: 5 [IU] via SUBCUTANEOUS
  Administered 2018-07-22 – 2018-07-23 (×3): 3 [IU] via SUBCUTANEOUS
  Administered 2018-07-24: 2 [IU] via SUBCUTANEOUS
  Filled 2018-07-19 (×9): qty 1

## 2018-07-19 MED ORDER — PALIPERIDONE ER 3 MG PO TB24
6.0000 mg | ORAL_TABLET | Freq: Every day | ORAL | Status: DC
  Administered 2018-07-19: 22:00:00 6 mg via ORAL
  Filled 2018-07-19: qty 2

## 2018-07-19 MED ORDER — INSULIN GLARGINE 100 UNIT/ML ~~LOC~~ SOLN
20.0000 [IU] | Freq: Every day | SUBCUTANEOUS | Status: DC
  Administered 2018-07-19 – 2018-07-20 (×2): 20 [IU] via SUBCUTANEOUS
  Filled 2018-07-19 (×3): qty 0.2

## 2018-07-19 MED ORDER — LITHIUM CARBONATE ER 300 MG PO TBCR
600.0000 mg | EXTENDED_RELEASE_TABLET | Freq: Every day | ORAL | Status: DC
  Administered 2018-07-19 – 2018-07-20 (×2): 600 mg via ORAL
  Filled 2018-07-19 (×2): qty 2

## 2018-07-19 NOTE — BHH Suicide Risk Assessment (Signed)
[facility] Discharge Suicide Risk Assessment   Principal Problem: Schizophrenia [facility]) Discharge Diagnoses: Principal Problem:   Schizophrenia (HCC) Active Problems:   Hypertension   Type 2 diabetes mellitus (HCC)   Total Time spent with patient: 1 hour  Musculoskeletal: Strength & Muscle Tone: within normal limits Gait & Station: normal Patient leans: N/A  Psychiatric Specialty Exam: Review of Systems  Constitutional: Negative.   HENT: Negative.   Eyes: Negative.   Respiratory: Negative.   Cardiovascular: Negative.   Gastrointestinal: Negative.   Musculoskeletal: Negative.   Skin: Negative.   Neurological: Negative.   Psychiatric/Behavioral: Negative.     Blood pressure 125/66, pulse 90, temperature 97.9 F (36.6 C), temperature source Oral, resp. rate 18, height 5\' 4"  (1.626 m), weight 106.1 kg, SpO2 98 %.Body mass index is 40.15 kg/m.  General Appearance: Casual  Eye Contact::  Fair  Speech:  [phone]  Volume:  Increased  Mood:  Euthymic  Affect:  Constricted  Thought Process:  Disorganized  Orientation:  Full (Time, Place, and Person)  Thought Content:  Illogical  Suicidal Thoughts:  No  Homicidal Thoughts:  No  Memory:  Immediate;   Fair Recent;   Fair Remote;   Fair  Judgement:  Fair  Insight:  Fair  Psychomotor Activity:  Normal  Concentration:  Fair  Recall:  [employer] of Knowledge:Fair  Language: Fair  Akathisia:  No  Handed:  Right  AIMS (if indicated):     Assets:  Desire for Improvement  Sleep:  Number of Hours: 8.5  Cognition: Impaired,  Mild  ADL's:  Intact   Mental Status Per Nursing Assessment::   On Admission:  NA  Demographic Factors:  Male and Unemployed  Loss Factors: Financial problems/change in socioeconomic status  Historical Factors: Impulsivity  Risk Reduction Factors:   Positive social support and Positive therapeutic relationship  Continued Clinical Symptoms:  Schizophrenia:   Paranoid or undifferentiated  type  Cognitive Features That Contribute To Risk:  Polarized thinking    Suicide Risk:  Minimal: No identifiable suicidal ideation.  Patients presenting with no risk factors but with morbid ruminations; may be classified as minimal risk based on the severity of the depressive symptoms  Follow-up Information    [facility] Follow up.   Why:  Continue to follow up with [facility] services as usual. Contact information: [address] [place], [place] [address] Phone: [phone] Fax: [phone]       [facility] Follow up on 07/27/2018.   Specialty:  Internal Medicine Why:  You have a telemedicine appointment with Dr. Fleet Contras on 07/27/2018 at 4:30 PM. He will call you on the phone so make sure to answer. Thank you! Contact information: [address] [place] [address] [phone]           Plan Of Care/Follow-up recommendations:  Activity:  Activity as tolerated Diet:  Carb controlled diet Other:  Working on improving psychotic symptoms and will make sure that he has appropriate discharge planning before he is released.  Mordecai Rasmussen, MD 07/19/2018, 2:32 PM

## 2018-07-19 NOTE — Progress Notes (Signed)
Recreation Therapy Notes  Date: 07/19/2018  Time: 9:30 am  Location: Craft Room  Behavioral response: Appropriate  Intervention Topic: Teamwork  Discussion/Intervention:  Group content on today was focused on teamwork. The group identified what teamwork is. Individuals described who is a part of their team. Patients expressed why they thought teamwork is important. The group stated reasons why they thought it was easier to work with a Comptroller team. Individuals discussed some positives and negatives of working with a team. Patients gave examples of past experiences they had while working with a team. The group participated in the intervention "What is That", where patients were given a chance to point out the qualities, they look for in a teammate and were able to work in teams with each other. Clinical Observations/Feedback:  Patient came to group and was pulled from group by Child psychotherapist. He returned to group and explained that working in a team means working with more qualities. Participant fell asleep in group and did not wake until the group had ended.   Victor Granados LRT/CTRS         Pasqual Farias 07/19/2018 11:37 AM

## 2018-07-19 NOTE — Tx Team (Addendum)
Interdisciplinary Treatment and Diagnostic Plan Update  07/19/2018 Time of Session: 230PM Mitchell [name] MRN: [id number]  Principal Diagnosis: Schizophrenia [facility])  Secondary Diagnoses: Principal Problem:   Schizophrenia (HCC) Active Problems:   Hypertension   Type 2 diabetes mellitus (HCC)   Current Medications:  Current Facility-Administered Medications  Medication Dose Route Frequency Provider Last Rate Last Dose  . acetaminophen (TYLENOL) tablet 650 mg  650 mg Oral Q6H PRN Mariel Craft, MD      . alum & mag hydroxide-simeth (MAALOX/MYLANTA) 200-200-20 MG/5ML suspension 30 mL  30 mL Oral Q4H PRN Mariel Craft, MD      . amLODipine (NORVASC) tablet 10 mg  10 mg Oral Daily Mariel Craft, MD   10 mg at 07/19/18 4239  . aspirin EC tablet 81 mg  81 mg Oral Daily Mariel Craft, MD   81 mg at 07/19/18 5320  . cloNIDine (CATAPRES) tablet 0.2 mg  0.2 mg Oral BID Mariel Craft, MD   0.2 mg at 07/19/18 2334  . fluticasone (FLONASE) 50 MCG/ACT nasal spray 2 spray  2 spray Each Nare Daily Mariel Craft, MD   2 spray at 07/18/18 2158  . glipiZIDE (GLUCOTROL XL) 24 hr tablet 10 mg  10 mg Oral Daily Mariel Craft, MD   10 mg at 07/19/18 3568  . hydrOXYzine (ATARAX/VISTARIL) tablet 25 mg  25 mg Oral TID PRN Mariel Craft, MD      . insulin aspart (novoLOG) injection 0-15 Units  0-15 Units Subcutaneous TID WC Clapacs, Jackquline Denmark, MD   15 Units at 07/19/18 1213  . insulin glargine (LANTUS) injection 20 Units  20 Units Subcutaneous QHS Clapacs, John T, MD      . lisinopril (ZESTRIL) tablet 40 mg  40 mg Oral Daily Mariel Craft, MD   40 mg at 07/19/18 6168  . lithium carbonate (LITHOBID) CR tablet 600 mg  600 mg Oral QHS Clapacs, John T, MD      . loratadine (CLARITIN) tablet 10 mg  10 mg Oral Daily Mariel Craft, MD   10 mg at 07/19/18 3729  . ziprasidone (GEODON) injection 20 mg  20 mg Intramuscular Q12H PRN Mariel Craft, MD       And  . LORazepam (ATIVAN) tablet 1 mg  1  mg Oral PRN Mariel Craft, MD      . magnesium hydroxide (MILK OF MAGNESIA) suspension 30 mL  30 mL Oral Daily PRN Mariel Craft, MD      . metFORMIN (GLUCOPHAGE) tablet 1,000 mg  1,000 mg Oral BID WC Mariel Craft, MD   1,000 mg at 07/19/18 0211  . paliperidone (INVEGA) 24 hr tablet 6 mg  6 mg Oral QHS Clapacs, John T, MD      . polyethylene glycol (MIRALAX / GLYCOLAX) packet 17 g  17 g Oral Daily Mariel Craft, MD   17 g at 07/19/18 [phone]  . ziprasidone (GEODON) capsule 40 mg  40 mg Oral BID WC Mariel Craft, MD   40 mg at 07/19/18 0802   PTA Medications: Medications Prior to Admission  Medication Sig Dispense Refill Last Dose  . amLODipine (NORVASC) 10 MG tablet Take 10 mg by mouth daily.   Past Week at Unknown time  . aspirin EC 81 MG tablet Take 81 mg by mouth daily.   Past Week at Unknown time  . cloNIDine (CATAPRES) 0.2 MG tablet Take 0.2 mg by mouth 2 (two) times  daily.   Past Week at Unknown time  . fluticasone (FLONASE) 50 MCG/ACT nasal spray Place 2 sprays into both nostrils daily. 16 g 2 Past Week at Unknown time  . glipiZIDE (GLUCOTROL XL) 10 MG 24 hr tablet Take 10 mg by mouth daily.   Past Week at Unknown time  . insulin aspart (NOVOLOG) 100 UNIT/ML injection Inject 0-15 Units into the skin 3 (three) times daily with meals. (Patient taking differently: Inject 0-15 Units into the skin 3 (three) times daily as needed for high blood sugar. BS 151-200: Take 2 units, 201-250: Takes 4 units, 251-300: Takes 6 units, 301-350: Take 8 units, 351-400: Take 10 units above 400 Take 15 units then notify MD.) 10 mL 11 12/31/2017 at Unknown time  . Liraglutide (VICTOZA) 18 MG/3ML SOLN injection Inject 1.2 mg into the skin daily.    12/31/2017 at Unknown time  . lisinopril (PRINIVIL,ZESTRIL) 40 MG tablet Take 40 mg by mouth daily.   Past Week at Unknown time  . lithium 300 MG tablet Take 1.5 tablets (450 mg total) by mouth every morning. (Patient taking differently: Take 300 mg by mouth  every morning. Take 300 mg in the AM and 450 mg QHS)   Past Week at Unknown time  . lithium carbonate (ESKALITH) 450 MG CR tablet Take 450 mg by mouth at bedtime.     Marland Kitchen loratadine (CLARITIN) 10 MG tablet Take 10 mg by mouth daily.   Past Week at Unknown time  . metFORMIN (GLUCOPHAGE) 1000 MG tablet Take 1,000 mg by mouth 2 (two) times daily with a meal.   12/30/2017 at Unknown time  . polyethylene glycol (MIRALAX / GLYCOLAX) packet Take 17 g by mouth daily.   Past Week at Unknown time  . traZODone (DESYREL) 100 MG tablet Take 100 mg by mouth daily.     . ziprasidone (GEODON) 40 MG capsule Take 1 capsule (40 mg total) by mouth 2 (two) times daily with a meal. 60 capsule 0     Patient Stressors: Financial difficulties Marital or family conflict Occupational concerns  Patient Strengths: Capable of independent living Motivation for treatment/growth Supportive family/friends  Treatment Modalities: Medication Management, Group therapy, Case management,  1 to 1 session with clinician, Psychoeducation, Recreational therapy.   Physician Treatment Plan for Primary Diagnosis: Schizophrenia (HCC) Long Term Goal(s): Improvement in symptoms so as ready for discharge Improvement in symptoms so as ready for discharge   Short Term Goals: Ability to verbalize feelings will improve Ability to demonstrate self-control will improve Ability to maintain clinical measurements within normal limits will improve  Medication Management: Evaluate patient's response, side effects, and tolerance of medication regimen.  Therapeutic Interventions: 1 to 1 sessions, Unit Group sessions and Medication administration.  Evaluation of Outcomes: Progressing  Physician Treatment Plan for Secondary Diagnosis: Principal Problem:   Schizophrenia (HCC) Active Problems:   Hypertension   Type 2 diabetes mellitus (HCC)  Long Term Goal(s): Improvement in symptoms so as ready for discharge Improvement in symptoms so as ready  for discharge   Short Term Goals: Ability to verbalize feelings will improve Ability to demonstrate self-control will improve Ability to maintain clinical measurements within normal limits will improve     Medication Management: Evaluate patient's response, side effects, and tolerance of medication regimen.  Therapeutic Interventions: 1 to 1 sessions, Unit Group sessions and Medication administration.  Evaluation of Outcomes: Progressing   RN Treatment Plan for Primary Diagnosis: Schizophrenia (HCC) Long Term Goal(s): Knowledge of disease and therapeutic regimen to  maintain health will improve  Short Term Goals: Ability to demonstrate self-control, Ability to participate in decision making will improve, Ability to identify and develop effective coping behaviors will improve and Compliance with prescribed medications will improve  Medication Management: RN will administer medications as ordered by provider, will assess and evaluate patient's response and provide education to patient for prescribed medication. RN will report any adverse and/or side effects to prescribing provider.  Therapeutic Interventions: 1 on 1 counseling sessions, Psychoeducation, Medication administration, Evaluate responses to treatment, Monitor vital signs and CBGs as ordered, Perform/monitor CIWA, COWS, AIMS and Fall Risk screenings as ordered, Perform wound care treatments as ordered.  Evaluation of Outcomes: Progressing   LCSW Treatment Plan for Primary Diagnosis: Schizophrenia (HCC) Long Term Goal(s): Safe transition to appropriate next level of care at discharge, Engage patient in therapeutic group addressing interpersonal concerns.  Short Term Goals: Engage patient in aftercare planning with referrals and resources, Increase social support, Facilitate acceptance of mental health diagnosis and concerns and Increase skills for wellness and recovery  Therapeutic Interventions: Assess for all discharge needs, 1  to 1 time with Social worker, Explore available resources and support systems, Assess for adequacy in community support network, Educate family and significant other(s) on suicide prevention, Complete Psychosocial Assessment, Interpersonal group therapy.  Evaluation of Outcomes: Progressing   Progress in Treatment: Attending groups: Yes. Participating in groups: Yes. Taking medication as prescribed: Yes. Toleration medication: Yes. Family/Significant other contact made: Yes, individual(s) contacted:  Dolly Riashake JAckson, pts friend Patient understands diagnosis: Yes. Discussing patient identified problems/goals with staff: Yes. Medical problems stabilized or resolved: Yes. Denies suicidal/homicidal ideation: Yes. Issues/concerns per patient self-inventory: No. Other: N/A  New problem(s) identified: No, Describe:  none  New Short Term/Long Term Goal(s): medication management for mood stabilization;  development of comprehensive mental wellness/sobriety plan.   Patient Goals:  "Focus on my freedom and think about myself. Make sure I'm better and my medication is right"  Discharge Plan or Barriers: SPE pamphlet, Mobile Crisis information, and AA/NA information provided to patient for additional community support and resources. Pt will follow up with his PSR services at [facility] and has an appointment scheduled with his PCP Dr. Fleet ContrasEdwin Avbuere on 07/27/2018.   Reason for Continuation of Hospitalization: Anxiety Medication stabilization  Estimated Length of Stay: 5-7 days  Recreational Therapy: Patient Stressors: Friends Patient Goal: Patient will identify benefit of making healthy decisions post d/c within 5 recreation therapy group sessions  Attendees: Patient: Mitchell [name] 07/19/2018 2:56 PM  Physician: Dr Toni Amendlapacs MD 07/19/2018 2:56 PM  Nursing: Doyce Paraemetria Ravenell RN 07/19/2018 2:56 PM  RN Care Manager: 07/19/2018 2:56 PM  Social Worker: Zollie Scalelivia Moton LCSW 07/19/2018 2:56 PM   Recreational Therapist: Garret ReddishShay Dali Kraner CTRS LRT 07/19/2018 2:56 PM  Other: Lowella Dandyarren Livingston LCSW  07/19/2018 2:56 PM  Other: Penni HomansMichaela Stanfield LCSW 07/19/2018 2:56 PM  Other: 07/19/2018 2:56 PM    Scribe for Treatment Team: Mechele Dawleylivia K Moton, LCSW 07/19/2018 2:56 PM

## 2018-07-19 NOTE — Progress Notes (Signed)
Recreation Therapy Notes  INPATIENT RECREATION THERAPY ASSESSMENT  Patient Details Name: Mitchell [name] MRN: [id number] DOB: [birth year] Today's Date: 07/19/2018       Information Obtained From: Patient  Able to Participate in Assessment/Interview: Yes  Patient Presentation: Responsive  Reason for Admission (Per Patient): Active Symptoms  Patient Stressors: Friends  Pharmacologist:   TV, Music, Talk  Leisure Interests (2+):  Exercise - Walking, Music - Singing, Music - Risk manager, Social - Friends  Frequency of Recreation/Participation: Chief Executive Officer of Community Resources:  Yes  Community Resources:  Park  Current Use:    If no, Barriers?:    Expressed Interest in [employer] Information:    [place] of Residence:  [place]  Patient Main Form of Transportation: Other (Comment)([facility])  Patient Strengths:  Music  Patient Identified Areas of Improvement:  Focus on me  Patient Goal for Hospitalization:  Focus on my freedom and think about myself and make sure medication is right  Current SI (including self-harm):  No  Current HI:  No  Current AVH: No  Staff Intervention Plan: Group Attendance, Collaborate with Interdisciplinary Treatment Team  Consent to Intern Participation: N/A  Mitchell [name] 07/19/2018, 3:08 PM

## 2018-07-19 NOTE — BHH Group Notes (Signed)
Emotional Regulation 07/19/2018 1PM  Type of Therapy/Topic:  Group Therapy:  Emotion Regulation  Participation Level:  Active   Description of Group:   The purpose of this group is to assist patients in learning to regulate negative emotions and experience positive emotions. Patients will be guided to discuss ways in which they have been vulnerable to their negative emotions. These vulnerabilities will be juxtaposed with experiences of positive emotions or situations, and patients will be challenged to use positive emotions to combat negative ones. Special emphasis will be placed on coping with negative emotions in conflict situations, and patients will process healthy conflict resolution skills.  Therapeutic Goals: 1. Patient will identify two positive emotions or experiences to reflect on in order to balance out negative emotions 2. Patient will label two or more emotions that they find the most difficult to experience 3. Patient will demonstrate positive conflict resolution skills through discussion and/or role plays  Summary of Patient Progress:  Actively and appropriately engaged in the group. Patient was able to provide support and validation to other group members.Patient practiced active listening when interacting with the facilitator and other group members. Patient stated "I' feel good knowing I came here." Pt says he is hopeful about returning home and looks forward to spending time with his family. Patient demonstrated insight and respected boundaries during session.      Therapeutic Modalities:   Cognitive Behavioral Therapy Feelings Identification Dialectical Behavioral Therapy   Suzan Slick, LCSW 07/19/2018 2:08 PM

## 2018-07-19 NOTE — BHH Suicide Risk Assessment (Signed)
BHH INPATIENT:  Family/Significant Other Suicide Prevention Education  Suicide Prevention Education:  Education Completed; Mitchell [name], friend [phone] has been identified by the patient as the family member/significant other with whom the patient will be residing, and identified as the person(s) who will aid the patient in the event of a mental health crisis (suicidal ideations/suicide attempt).  With written consent from the patient, the family member/significant other has been provided the following suicide prevention education, prior to the and/or following the discharge of the patient.  The suicide prevention education provided includes the following:  Suicide risk factors  Suicide prevention and interventions  National Suicide Hotline telephone number  [facility] assessment telephone number  [facility] Emergency Assistance 911  [facility] and/or Residential Mobile Crisis Unit telephone number  Request made of family/significant other to:  Remove weapons (e.g., guns, rifles, knives), all items previously/currently identified as safety concern.    Remove drugs/medications (over-the-counter, prescriptions, illicit drugs), all items previously/currently identified as a safety concern.  The family member/significant other verbalizes understanding of the suicide prevention education information provided.  The family member/significant other agrees to remove the items of safety concern listed above.  Mitchell [name] reported that she goes by the name Mitchell [name]. She states that she told Mitchell [name] to come to the hospital because he needed help due to shaking a lot, moving his hands a lot, getting mad easily, not listening, and saying he wanted to kill himself. Mitchell [name] denied concerns with Mitchell [name] returning back to his apartment and SI. Mitchell [name] also denied knowledge of guns/weapons being in the home.   Mitchell [name] 07/19/2018, 12:38 PM

## 2018-07-19 NOTE — Progress Notes (Signed)
CSW spoke with Mitchell [name] [phone] who is pts case worker at [facility]. Mitchell [name] stated that pt does not have 22 children and they have been unable to verify whether or not pt has any children at all, although pt talks about having children and grandchildren. She discussed pt getting PSR services through [facility] as well as working with the employment team. She reports that pt is currently working at [employer], but due to COVID-19 he has been home more. She reported that pt also does not have a payee, but there is someone who helps him manage his money through a housing program he works with, but they just make sure he pays his rent. Mitchell [name] also reported that they would like pt to be on an injection to assist him with staying medicated and that [facility] will be assisting with his transportation home at discharge as they need to figure out who this male is living in pts house.   [name], MSW, LCSW Clinical Social Work 07/19/2018 9:53 AM

## 2018-07-19 NOTE — H&P (Signed)
Psychiatric Admission Assessment Adult  Patient Identification: Mitchell [name] MRN:  [id number] Date of Evaluation:  07/19/2018 Chief Complaint:  Schizoaffective Disorder, Bipolar Type Principal Diagnosis: Schizophrenia (HCC) Diagnosis:  Principal Problem:   Schizophrenia (HCC) Active Problems:   Hypertension   Type 2 diabetes mellitus (HCC)  History of Present Illness: This is a patient with a history of schizophrenia who was transferred to [place] from [place] where he presented voluntarily with confusion and agitation and paranoid psychosis.  History appears to be that he has been decompensating since living independently.  Has been having trouble taking care of himself at home.  Not taking care of his health very well.  [facility] has been concerned that he is not functioning well.  On interview today the patient is a little disorganized.  He says he has been living in an apartment with some woman and that he and she were not getting along for a variety of reasons which is why he left her.  Patient denies any suicidal or homicidal thoughts.  Denies any hallucinations.  He claims that he has been fully compliant with his medicine which seems very unlikely given his out-of-control blood sugars.  He denies drug or alcohol abuse.  According to the collateral history which seems more reliable than the patient's own history he had been transitioning into independent living for about a month now. Associated Signs/Symptoms: Depression Symptoms:  anxiety, (Hypo) Manic Symptoms:  Distractibility, Flight of Ideas, Impulsivity, Anxiety Symptoms:  Excessive Worry, Psychotic Symptoms:  Delusions, Ideas of Reference, Paranoia, PTSD Symptoms: Negative Total Time spent with patient: 1 hour  Past Psychiatric History: Patient has an established diagnosis of schizophrenia and has had several prior hospitalizations.  Has been seen primarily in [place] in the past.  He tells me  that he has had a suicide attempt but it was many years ago.  Denies any history of violence.  Patient cannot remember being on any medicines other than the Zyprexa and lithium that he is listed as being on now.  Previously had been living in a group home which seems to have been helping him to stay stable.  Recently transitioning to independent living with evident noncompliance.  Is the patient at risk to self? Yes.    Has the patient been a risk to self in the past 6 months? No.  Has the patient been a risk to self within the distant past? Yes.    Is the patient a risk to others? No.  Has the patient been a risk to others in the past 6 months? No.  Has the patient been a risk to others within the distant past? No.   Prior Inpatient Therapy:   Prior Outpatient Therapy:    Alcohol Screening: 1. How often do you have a drink containing alcohol?: Monthly or less 2. How many drinks containing alcohol do you have on a typical day when you are drinking?: 3 or 4 3. How often do you have six or more drinks on one occasion?: Less than monthly AUDIT-C Score: 3 4. How often during the last year have you found that you were not able to stop drinking once you had started?: Less than monthly 5. How often during the last year have you failed to do what was normally expected from you becasue of drinking?: Less than monthly 6. How often during the last year have you needed a first drink in the morning to get yourself going after a heavy drinking session?: Less  than monthly 7. How often during the last year have you had a feeling of guilt of remorse after drinking?: Less than monthly 8. How often during the last year have you been unable to remember what happened the night before because you had been drinking?: Less than monthly 9. Have you or someone else been injured as a result of your drinking?: No 10. Has a relative or friend or a doctor or another health worker been concerned about your drinking or  suggested you cut down?: No Alcohol Use Disorder Identification Test Final Score (AUDIT): 8 Alcohol Brief Interventions/Follow-up: Alcohol Education Substance Abuse History in the last 12 months:  No. Consequences of Substance Abuse: Negative Previous Psychotropic Medications: Yes  Psychological Evaluations: Yes  Past Medical History:  Past Medical History:  Diagnosis Date  . Bipolar affective disorder (HCC)    takes Zyprexa daily  . Diabetes mellitus    takes Victoza,Metformin,and Glipizide daily  . Hypertension    takes Amlodipine,Lisinopril and Clonidine daily  . Hyponatremia    history of  . Mental disorder    takes Lithium daily  . Schizoaffective disorder    takes Trazodone nightly  . Seasonal allergies    takes Claritin daily  . Sleep apnea    sleep study >2351yrs ago  . Stroke [facility](HCC)    left arm weakness    Past Surgical History:  Procedure Laterality Date  . CATARACT EXTRACTION W/PHACO Right 02/14/2013   Procedure: CATARACT EXTRACTION PHACO AND INTRAOCULAR LENS PLACEMENT (IOC);  Surgeon: Shade FloodGreer Geiger, MD;  [facility];  Service: Ophthalmology;  Laterality: Right;  . CATARACT EXTRACTION W/PHACO Left 06/13/2013   Procedure: CATARACT EXTRACTION PHACO AND INTRAOCULAR LENS PLACEMENT (IOC);  Surgeon: Shade FloodGreer Geiger, MD;  [facility];  Service: Ophthalmology;  Laterality: Left;  . CIRCUMCISION  20 yrs. ago  . EYE SURGERY     Family History: History reviewed. No pertinent family history. Family Psychiatric  History: Patient tells me his mother had psychotic symptoms as well. Tobacco Screening: Have you used any form of tobacco in the last 30 days? (Cigarettes, Smokeless Tobacco, Cigars, and/or Pipes): No Social History:  Social History   Substance and Sexual Activity  Alcohol Use No     Social History   Substance and Sexual Activity  Drug Use No    Additional Social History: Marital status: Separated Separated, when?: "For a long time" Has your sexual  activity been affected by drugs, alcohol, medication, or emotional stress?: pt denies Does patient have children?: Yes How many children?: 22 How is patient's relationship with their children?: PT reports having "9 boys and 13 girls", but when speaking with his case worker [facility] [place], she reports no evidence of pt having any children although he speaks about having children and grandchildren,                         Allergies:  No Known Allergies Lab Results:  Results for orders placed or performed during the hospital encounter of 07/18/18 (from the past 48 hour(s))  Glucose, capillary     Status: Abnormal   Collection Time: 07/18/18  8:05 PM  Result Value Ref Range   Glucose-Capillary 212 (H) 70 - 99 mg/dL  Glucose, capillary     Status: Abnormal   Collection Time: 07/19/18  7:12 AM  Result Value Ref Range   Glucose-Capillary 270 (H) 70 - 99 mg/dL   Comment 1 Notify RN   Glucose, capillary  Status: Abnormal   Collection Time: 07/19/18 12:11 PM  Result Value Ref Range   Glucose-Capillary 397 (H) 70 - 99 mg/dL    Blood Alcohol level:  Lab Results  Component Value Date   ETH <10 07/18/2018   ETH <10 12/31/2017    Metabolic Disorder Labs:  Lab Results  Component Value Date   HGBA1C 12.6 (H) 07/18/2018   MPG 314.92 07/18/2018   MPG 128 10/02/2014   No results found for: PROLACTIN Lab Results  Component Value Date   CHOL 151 07/18/2018   TRIG 122 07/18/2018   HDL 44 07/18/2018   CHOLHDL 3.4 07/18/2018   VLDL 24 07/18/2018   LDLCALC 83 07/18/2018   LDLCALC 70 01/27/2011    Current Medications: Current Facility-Administered Medications  Medication Dose Route Frequency Provider Last Rate Last Dose  . acetaminophen (TYLENOL) tablet 650 mg  650 mg Oral Q6H PRN Mariel Craft, MD      . alum & mag hydroxide-simeth (MAALOX/MYLANTA) 200-200-20 MG/5ML suspension 30 mL  30 mL Oral Q4H PRN Mariel Craft, MD      . amLODipine (NORVASC)  tablet 10 mg  10 mg Oral Daily Mariel Craft, MD   10 mg at 07/19/18 4098  . aspirin EC tablet 81 mg  81 mg Oral Daily Mariel Craft, MD   81 mg at 07/19/18 1191  . cloNIDine (CATAPRES) tablet 0.2 mg  0.2 mg Oral BID Mariel Craft, MD   0.2 mg at 07/19/18 4782  . fluticasone (FLONASE) 50 MCG/ACT nasal spray 2 spray  2 spray Each Nare Daily Mariel Craft, MD   2 spray at 07/18/18 2158  . glipiZIDE (GLUCOTROL XL) 24 hr tablet 10 mg  10 mg Oral Daily Mariel Craft, MD   10 mg at 07/19/18 9562  . hydrOXYzine (ATARAX/VISTARIL) tablet 25 mg  25 mg Oral TID PRN Mariel Craft, MD      . insulin aspart (novoLOG) injection 0-15 Units  0-15 Units Subcutaneous TID WC , Jackquline Denmark, MD   15 Units at 07/19/18 1213  . insulin glargine (LANTUS) injection 20 Units  20 Units Subcutaneous QHS ,  T, MD      . lisinopril (ZESTRIL) tablet 40 mg  40 mg Oral Daily Mariel Craft, MD   40 mg at 07/19/18 1308  . lithium carbonate (LITHOBID) CR tablet 600 mg  600 mg Oral QHS ,  T, MD      . loratadine (CLARITIN) tablet 10 mg  10 mg Oral Daily Mariel Craft, MD   10 mg at 07/19/18 6578  . ziprasidone (GEODON) injection 20 mg  20 mg Intramuscular Q12H PRN Mariel Craft, MD       And  . LORazepam (ATIVAN) tablet 1 mg  1 mg Oral PRN Mariel Craft, MD      . magnesium hydroxide (MILK OF MAGNESIA) suspension 30 mL  30 mL Oral Daily PRN Mariel Craft, MD      . metFORMIN (GLUCOPHAGE) tablet 1,000 mg  1,000 mg Oral BID WC Mariel Craft, MD   1,000 mg at 07/19/18 4696  . paliperidone (INVEGA) 24 hr tablet 6 mg  6 mg Oral QHS ,  T, MD      . polyethylene glycol (MIRALAX / GLYCOLAX) packet 17 g  17 g Oral Daily Mariel Craft, MD   17 g at 07/19/18 [id number]  . ziprasidone (GEODON) capsule 40 mg  40 mg Oral BID WC Viviano Simas,  Orlean Bradford, MD   40 mg at 07/19/18 3710   PTA Medications: Medications Prior to Admission  Medication Sig Dispense Refill Last Dose  . amLODipine  (NORVASC) 10 MG tablet Take 10 mg by mouth daily.   Past Week at Unknown time  . aspirin EC 81 MG tablet Take 81 mg by mouth daily.   Past Week at Unknown time  . cloNIDine (CATAPRES) 0.2 MG tablet Take 0.2 mg by mouth 2 (two) times daily.   Past Week at Unknown time  . fluticasone (FLONASE) 50 MCG/ACT nasal spray Place 2 sprays into both nostrils daily. 16 g 2 Past Week at Unknown time  . glipiZIDE (GLUCOTROL XL) 10 MG 24 hr tablet Take 10 mg by mouth daily.   Past Week at Unknown time  . insulin aspart (NOVOLOG) 100 UNIT/ML injection Inject 0-15 Units into the skin 3 (three) times daily with meals. (Patient taking differently: Inject 0-15 Units into the skin 3 (three) times daily as needed for high blood sugar. BS 151-200: Take 2 units, 201-250: Takes 4 units, 251-300: Takes 6 units, 301-350: Take 8 units, 351-400: Take 10 units above 400 Take 15 units then notify MD.) 10 mL 11 12/31/2017 at Unknown time  . Liraglutide (VICTOZA) 18 MG/3ML SOLN injection Inject 1.2 mg into the skin daily.    12/31/2017 at Unknown time  . lisinopril (PRINIVIL,ZESTRIL) 40 MG tablet Take 40 mg by mouth daily.   Past Week at Unknown time  . lithium 300 MG tablet Take 1.5 tablets (450 mg total) by mouth every morning. (Patient taking differently: Take 300 mg by mouth every morning. Take 300 mg in the AM and 450 mg QHS)   Past Week at Unknown time  . lithium carbonate (ESKALITH) 450 MG CR tablet Take 450 mg by mouth at bedtime.     Marland Kitchen loratadine (CLARITIN) 10 MG tablet Take 10 mg by mouth daily.   Past Week at Unknown time  . metFORMIN (GLUCOPHAGE) 1000 MG tablet Take 1,000 mg by mouth 2 (two) times daily with a meal.   12/30/2017 at Unknown time  . polyethylene glycol (MIRALAX / GLYCOLAX) packet Take 17 g by mouth daily.   Past Week at Unknown time  . traZODone (DESYREL) 100 MG tablet Take 100 mg by mouth daily.     . ziprasidone (GEODON) 40 MG capsule Take 1 capsule (40 mg total) by mouth 2 (two) times daily with a meal. 60  capsule 0     Musculoskeletal: Strength & Muscle Tone: within normal limits Gait & Station: normal Patient leans: N/A  Psychiatric Specialty Exam: Physical Exam  Nursing note and vitals reviewed. Constitutional: He appears well-developed and well-nourished.  HENT:  Head: Normocephalic and atraumatic.  Eyes: Pupils are equal, round, and reactive to light. Conjunctivae are normal.  Neck: Normal range of motion.  Cardiovascular: Regular rhythm and normal heart sounds.  Respiratory: Effort normal. No respiratory distress.  GI: Soft.  Musculoskeletal: Normal range of motion.  Neurological: He is alert.  Skin: Skin is warm and dry.  Psychiatric: His affect is labile. His speech is rapid and/or pressured and tangential. He is agitated. He is not aggressive and not hyperactive. Thought content is delusional. Cognition and memory are impaired. He expresses impulsivity.    Review of Systems  Constitutional: Negative.   HENT: Negative.   Eyes: Negative.   Respiratory: Negative.   Cardiovascular: Negative.   Gastrointestinal: Negative.   Musculoskeletal: Negative.   Skin: Negative.   Neurological: Negative.  Psychiatric/Behavioral: Positive for memory loss. Negative for depression, hallucinations, substance abuse and suicidal ideas. The patient is nervous/anxious and has insomnia.     Blood pressure 125/66, pulse 90, temperature 97.9 F (36.6 C), temperature source Oral, resp. rate 18, height  (1.626 m), weight 106.1 kg, SpO2 98 %.Body mass index is 40.15 kg/m.  General Appearance: Casual  Eye Contact:  Fair  Speech:  Garbled and Pressured  Volume:  Increased  Mood:  Anxious  Affect:  Constricted  Thought Process:  Disorganized  Orientation:  Full (Time, Place, and Person)  Thought Content:  Illogical, Rumination and Tangential  Suicidal Thoughts:  No  Homicidal Thoughts:  No  Memory:  Immediate;   Fair Recent;   Fair Remote;   Fair  Judgement:  Impaired  Insight:   Shallow  Psychomotor Activity:  Decreased  Concentration:  Concentration: Fair  Recall:  [employer] of Knowledge:  Fair  Language:  Fair  Akathisia:  No  Handed:  Right  AIMS (if indicated):     Assets:  Desire for Improvement Housing Resilience Social Support  ADL's:  Impaired  Cognition:  Impaired,  Mild  Sleep:  Number of Hours: 8.5    Treatment Plan Summary: Daily contact with patient to assess and evaluate symptoms and progress in treatment, Medication management and Plan Patient with schizophrenia.  Seems like he must be a little better organized today than when they first saw him in the emergency room.  Not making any frankly bizarre statements in conversation with me although he is disorganized in his thinking.  Review of the notes from [place] shows that his caseworker feels he has been decompensating and wants [place] to consider switching him to long-acting injectable.  I think that is fine and would go along with my concern that his blood sugars are running too high and we should probably look for alternatives to olanzapine.  I am going to stop the olanzapine and replace it with Invega in hope of switching to the long-acting injectable.  We are also going to try to get him on better diabetes control by starting Lantus at night and continuing sliding scale along with oral medicine such as metformin.  Engage patient in groups and activities appropriately on the unit.  Work with outpatient team in arranging for disposition.  Observation Level/Precautions:  15 minute checks  Laboratory:  Chemistry Profile  Psychotherapy:    Medications:    Consultations:    Discharge Concerns:    Estimated LOS:  Other:     Physician Treatment Plan for Primary Diagnosis: Schizophrenia (HCC) Long Term Goal(s): Improvement in symptoms so as ready for discharge  Short Term Goals: Ability to verbalize feelings will improve and Ability to demonstrate self-control will improve  Physician Treatment  Plan for Secondary Diagnosis: Principal Problem:   Schizophrenia (HCC) Active Problems:   Hypertension   Type 2 diabetes mellitus (HCC)  Long Term Goal(s): Improvement in symptoms so as ready for discharge  Short Term Goals: Ability to maintain clinical measurements within normal limits will improve  I certify that inpatient services furnished can reasonably be expected to improve the patient's condition.    Mordecai Rasmussen, MD 4/22/20202:25 PM

## 2018-07-19 NOTE — Plan of Care (Signed)
D- Patient alert and oriented. Patient presents in a pleasant mood on assessment stating that he slept "wonderful" last night and had no major complaints or concerns to voice to this Clinical research associate. Patient denies any signs/symptoms of depression and anxiety. Patient denies SI, HI, AVH, and pain at this time, stating "the only thing I hear is Jesus". Patient's goal for today is to "sit down and pray and think about the good times, getting out of here and pay attention to y'all".   A- Scheduled medications administered to patient, per MD orders. Support and encouragement provided.  Routine safety checks conducted every 15 minutes.  Patient informed to notify staff with problems or concerns.  R- No adverse drug reactions noted. Patient contracts for safety at this time. Patient compliant with medications and treatment plan. Patient receptive, calm, and cooperative. Patient interacts well with others on the unit.  Patient remains safe at this time.   Problem: Education: Goal: Utilization of techniques to improve thought processes will improve Outcome: Progressing Goal: Knowledge of the prescribed therapeutic regimen will improve Outcome: Progressing   Problem: Activity: Goal: Interest or engagement in leisure activities will improve Outcome: Progressing Goal: Imbalance in normal sleep/wake cycle will improve Outcome: Progressing   Problem: Coping: Goal: Coping ability will improve Outcome: Progressing Goal: Will verbalize feelings Outcome: Progressing   Problem: Health Behavior/Discharge Planning: Goal: Ability to make decisions will improve Outcome: Progressing Goal: Compliance with therapeutic regimen will improve Outcome: Progressing   Problem: Education: Goal: Ability to state activities that reduce stress will improve Outcome: Progressing   Problem: Coping: Goal: Ability to identify and develop effective coping behavior will improve Outcome: Progressing   Problem:  Self-Concept: Goal: Ability to identify factors that promote anxiety will improve Outcome: Progressing Goal: Level of anxiety will decrease Outcome: Progressing Goal: Ability to modify response to factors that promote anxiety will improve Outcome: Progressing   Problem: Education: Goal: Ability to describe self-care measures that may prevent or decrease complications (Diabetes Survival Skills Education) will improve Outcome: Progressing Goal: Individualized Educational Video(s) Outcome: Progressing   Problem: Coping: Goal: Ability to adjust to condition or change in health will improve Outcome: Progressing   Problem: Health Behavior/Discharge Planning: Goal: Ability to identify and utilize available resources and services will improve Outcome: Progressing Goal: Ability to manage health-related needs will improve Outcome: Progressing   Problem: Activity: Goal: Will identify at least one activity in which they can participate Outcome: Progressing   Problem: Coping: Goal: Ability to identify and develop effective coping behavior will improve Outcome: Progressing Goal: Ability to interact with others will improve Outcome: Progressing Goal: Demonstration of participation in decision-making regarding own care will improve Outcome: Progressing Goal: Ability to use eye contact when communicating with others will improve Outcome: Progressing   Problem: Self-Concept: Goal: Will verbalize positive feelings about self Outcome: Progressing

## 2018-07-19 NOTE — Progress Notes (Signed)
Inpatient Diabetes Program Recommendations  AACE/ADA: New Consensus Statement on Inpatient Glycemic Control (2015)  Target Ranges:  Prepandial:   less than 140 mg/dL      Peak postprandial:   less than 180 mg/dL (1-2 hours)      Critically ill patients:  140 - 180 mg/dL   Lab Results  Component Value Date   GLUCAP 270 (H) 07/19/2018   HGBA1C 12.6 (H) 07/18/2018    Review of Glycemic Control Results for KINNEY, GUERECA (MRN [id number]) as of 07/19/2018 09:46  Ref. Range 07/18/2018 12:51 07/18/2018 15:22 07/18/2018 17:00 07/18/2018 18:41 07/18/2018 20:05 07/19/2018 07:12  Glucose-Capillary Latest Ref Range: 70 - 99 mg/dL 233 (H) 007 (H) 622 (H) 260 (H) 212 (H) 270 (H)   Diabetes history: DM 2 Outpatient Diabetes medications:  Glucotrol XL 10 mg daily, Novolog 0-15 units tid with meals, Victoza 1.5 mg daily, Metformin 1000 mg bid Current orders for Inpatient glycemic control: Novolog moderate tid with meals, Metformin 1000 mg bid, Glipizide XL 10 mg daily Inpatient Diabetes Program Recommendations:   Note A1C indicates uncontrolled DM prior to admit.  While in the hospital, consider adding Lantus 20 units daily.  May need insulin at d/c as well.  Will follow.   Thanks,  Beryl Meager, RN, BC-ADM Inpatient Diabetes Coordinator Pager [phone] (8a-5p)
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
## 2018-07-20 LAB — GLUCOSE, CAPILLARY
Glucose-Capillary: 181 mg/dL — ABNORMAL HIGH (ref 70–99)
Glucose-Capillary: 263 mg/dL — ABNORMAL HIGH (ref 70–99)
Glucose-Capillary: 108 mg/dL — ABNORMAL HIGH (ref 70–99)
Glucose-Capillary: 139 mg/dL — ABNORMAL HIGH (ref 70–99)

## 2018-07-20 MED ORDER — PALIPERIDONE PALMITATE ER 234 MG/1.5ML IM SUSY
234.0000 mg | PREFILLED_SYRINGE | Freq: Once | INTRAMUSCULAR | Status: AC
  Administered 2018-07-20: 234 mg via INTRAMUSCULAR
  Filled 2018-07-20 (×2): qty 1.5

## 2018-07-20 NOTE — Progress Notes (Signed)
[facility] MD Progress Note  07/20/2018 4:02 PM Mitchell [name]  MRN:  [id number] Subjective: Follow-up for patient with schizophrenia.  He tells me today he is feeling "great".  Denies hallucinations.  Denies any suicidal thoughts.  Patient has spent most of his day sitting in front of the television but he is lucid and oriented.  He is taking care of his hygiene fine and doing his laundry.  He had no complaints about the Invega. Principal Problem: Schizophrenia (HCC) Diagnosis: Principal Problem:   Schizophrenia (HCC) Active Problems:   Hypertension   Type 2 diabetes mellitus (HCC)  Total Time spent with patient: 30 minutes  Past Psychiatric History: Patient has a long history of schizophrenia with lots of time spent in group homes but is now transitioning to independent living  Past Medical History:  Past Medical History:  Diagnosis Date  . Bipolar affective disorder (HCC)    takes Zyprexa daily  . Diabetes mellitus    takes Victoza,Metformin,and Glipizide daily  . Hypertension    takes Amlodipine,Lisinopril and Clonidine daily  . Hyponatremia    history of  . Mental disorder    takes Lithium daily  . Schizoaffective disorder    takes Trazodone nightly  . Seasonal allergies    takes Claritin daily  . Sleep apnea    sleep study >53yrs ago  . Stroke [facility])    left arm weakness    Past Surgical History:  Procedure Laterality Date  . CATARACT EXTRACTION W/PHACO Right 02/14/2013   Procedure: CATARACT EXTRACTION PHACO AND INTRAOCULAR LENS PLACEMENT (IOC);  Surgeon: Shade Flood, MD;  Location: [facility] OR;  Service: Ophthalmology;  Laterality: Right;  . CATARACT EXTRACTION W/PHACO Left 06/13/2013   Procedure: CATARACT EXTRACTION PHACO AND INTRAOCULAR LENS PLACEMENT (IOC);  Surgeon: Shade Flood, MD;  Location: [facility] OR;  Service: Ophthalmology;  Laterality: Left;  . CIRCUMCISION  20 yrs. ago  . EYE SURGERY     Family History: History reviewed. No pertinent family history. Family Psychiatric   History: See previous Social History:  Social History   Substance and Sexual Activity  Alcohol Use No     Social History   Substance and Sexual Activity  Drug Use No    Social History   Socioeconomic History  . Marital status: Divorced    Spouse name: Not on file  . Number of children: Not on file  . Years of education: Not on file  . Highest education level: Not on file  Occupational History  . Not on file  Social Needs  . Financial resource strain: Not on file  . Food insecurity:    Worry: Not on file    Inability: Not on file  . Transportation needs:    Medical: Not on file    Non-medical: Not on file  Tobacco Use  . Smoking status: Never Smoker  . Smokeless tobacco: Never Used  Substance and Sexual Activity  . Alcohol use: No  . Drug use: No  . Sexual activity: Yes    Birth control/protection: None  Lifestyle  . Physical activity:    Days per week: Not on file    Minutes per session: Not on file  . Stress: Not on file  Relationships  . Social connections:    Talks on phone: Not on file    Gets together: Not on file    Attends religious service: Not on file    Active member of club or organization: Not on file    Attends meetings of clubs  or organizations: Not on file    Relationship status: Not on file  Other Topics Concern  . Not on file  Social History Narrative  . Not on file   Additional Social History:                         Sleep: Fair  Appetite:  Fair  Current Medications: Current Facility-Administered Medications  Medication Dose Route Frequency Provider Last Rate Last Dose  . acetaminophen (TYLENOL) tablet 650 mg  650 mg Oral Q6H PRN Mariel Craft, MD      . alum & mag hydroxide-simeth (MAALOX/MYLANTA) 200-200-20 MG/5ML suspension 30 mL  30 mL Oral Q4H PRN Mariel Craft, MD      . amLODipine (NORVASC) tablet 10 mg  10 mg Oral Daily Mariel Craft, MD   10 mg at 07/20/18 0801  . aspirin EC tablet 81 mg  81 mg Oral  Daily Mariel Craft, MD   81 mg at 07/20/18 0802  . cloNIDine (CATAPRES) tablet 0.2 mg  0.2 mg Oral BID Mariel Craft, MD   0.2 mg at 07/20/18 0802  . fluticasone (FLONASE) 50 MCG/ACT nasal spray 2 spray  2 spray Each Nare Daily Mariel Craft, MD   2 spray at 07/20/18 0805  . glipiZIDE (GLUCOTROL XL) 24 hr tablet 10 mg  10 mg Oral Daily Mariel Craft, MD   10 mg at 07/20/18 0805  . hydrOXYzine (ATARAX/VISTARIL) tablet 25 mg  25 mg Oral TID PRN Mariel Craft, MD      . insulin aspart (novoLOG) injection 0-15 Units  0-15 Units Subcutaneous TID WC , Jackquline Denmark, MD   8 Units at 07/20/18 1143  . insulin glargine (LANTUS) injection 20 Units  20 Units Subcutaneous QHS , Jackquline Denmark, MD   20 Units at 07/19/18 2142  . lisinopril (ZESTRIL) tablet 40 mg  40 mg Oral Daily Mariel Craft, MD   40 mg at 07/20/18 0801  . lithium carbonate (LITHOBID) CR tablet 600 mg  600 mg Oral QHS ,  T, MD   600 mg at 07/19/18 2142  . loratadine (CLARITIN) tablet 10 mg  10 mg Oral Daily Mariel Craft, MD   10 mg at 07/20/18 0802  . ziprasidone (GEODON) injection 20 mg  20 mg Intramuscular Q12H PRN Mariel Craft, MD       And  . LORazepam (ATIVAN) tablet 1 mg  1 mg Oral PRN Mariel Craft, MD      . magnesium hydroxide (MILK OF MAGNESIA) suspension 30 mL  30 mL Oral Daily PRN Mariel Craft, MD      . metFORMIN (GLUCOPHAGE) tablet 1,000 mg  1,000 mg Oral BID WC Mariel Craft, MD   1,000 mg at 07/20/18 0802  . paliperidone (INVEGA SUSTENNA) injection 234 mg  234 mg Intramuscular Once ,  T, MD      . polyethylene glycol (MIRALAX / GLYCOLAX) packet 17 g  17 g Oral Daily Mariel Craft, MD   17 g at 07/20/18 0801  . ziprasidone (GEODON) capsule 40 mg  40 mg Oral BID WC Mariel Craft, MD   40 mg at 07/20/18 1610    Lab Results:  Results for orders placed or performed during the hospital encounter of 07/18/18 (from the past 48 hour(s))  Glucose, capillary     Status:  Abnormal   Collection Time: 07/18/18  8:05 PM  Result Value  Ref Range   Glucose-Capillary 212 (H) 70 - 99 mg/dL  Glucose, capillary     Status: Abnormal   Collection Time: 07/19/18  7:12 AM  Result Value Ref Range   Glucose-Capillary 270 (H) 70 - 99 mg/dL   Comment 1 Notify RN   Glucose, capillary     Status: Abnormal   Collection Time: 07/19/18 12:11 PM  Result Value Ref Range   Glucose-Capillary 397 (H) 70 - 99 mg/dL  Glucose, capillary     Status: Abnormal   Collection Time: 07/19/18  4:21 PM  Result Value Ref Range   Glucose-Capillary 251 (H) 70 - 99 mg/dL  Glucose, capillary     Status: Abnormal   Collection Time: 07/19/18  9:43 PM  Result Value Ref Range   Glucose-Capillary 131 (H) 70 - 99 mg/dL  Glucose, capillary     Status: Abnormal   Collection Time: 07/20/18  6:58 AM  Result Value Ref Range   Glucose-Capillary 181 (H) 70 - 99 mg/dL  Glucose, capillary     Status: Abnormal   Collection Time: 07/20/18 11:04 AM  Result Value Ref Range   Glucose-Capillary 263 (H) 70 - 99 mg/dL   Comment 1 Notify RN     Blood Alcohol level:  Lab Results  Component Value Date   ETH <10 07/18/2018   ETH <10 12/31/2017    Metabolic Disorder Labs: Lab Results  Component Value Date   HGBA1C 12.6 (H) 07/18/2018   MPG 314.92 07/18/2018   MPG 128 10/02/2014   No results found for: PROLACTIN Lab Results  Component Value Date   CHOL 151 07/18/2018   TRIG 122 07/18/2018   HDL 44 07/18/2018   CHOLHDL 3.4 07/18/2018   VLDL 24 07/18/2018   LDLCALC 83 07/18/2018   LDLCALC 70 01/27/2011    Physical Findings: AIMS: Facial and Oral Movements Muscles of Facial Expression: None, normal Lips and Perioral Area: None, normal Jaw: None, normal Tongue: None, normal,Extremity Movements Upper (arms, wrists, hands, fingers): None, normal Lower (legs, knees, ankles, toes): None, normal, Trunk Movements Neck, shoulders, hips: None, normal, Overall Severity Severity of abnormal movements  (highest score from questions above): None, normal Incapacitation due to abnormal movements: None, normal Patient's awareness of abnormal movements (rate only patient's report): No Awareness, Dental Status Current problems with teeth and/or dentures?: No Does patient usually wear dentures?: No  CIWA:  CIWA-Ar Total: 2 COWS:  COWS Total Score: 3  Musculoskeletal: Strength & Muscle Tone: within normal limits Gait & Station: normal Patient leans: N/A  Psychiatric Specialty Exam: Physical Exam  Nursing note and vitals reviewed. Constitutional: He appears well-developed and well-nourished.  HENT:  Head: Normocephalic and atraumatic.  Eyes: Pupils are equal, round, and reactive to light. Conjunctivae are normal.  Neck: Normal range of motion.  Cardiovascular: Regular rhythm and normal heart sounds.  Respiratory: Effort normal. No respiratory distress.  GI: Soft.  Musculoskeletal: Normal range of motion.  Neurological: He is alert.  Skin: Skin is warm and dry.  Psychiatric: He has a normal mood and affect. Judgment normal. His speech is delayed. He is slowed. Cognition and memory are impaired. He expresses no suicidal ideation.    Review of Systems  Constitutional: Negative.   HENT: Negative.   Eyes: Negative.   Respiratory: Negative.   Cardiovascular: Negative.   Gastrointestinal: Negative.   Musculoskeletal: Negative.   Skin: Negative.   Neurological: Negative.   Psychiatric/Behavioral: Negative.     Blood pressure 118/76, pulse 78, temperature 98 F (36.7 C),  temperature source Oral, resp. rate 18, height  (1.626 m), weight 106.1 kg, SpO2 98 %.Body mass index is 40.15 kg/m.  General Appearance: Casual  Eye Contact:  Good  Speech:  Clear and Coherent  Volume:  Normal  Mood:  Euthymic  Affect:  Congruent  Thought Process:  Goal Directed  Orientation:  Full (Time, Place, and Person)  Thought Content:  Logical  Suicidal Thoughts:  No  Homicidal Thoughts:  No   Memory:  Immediate;   Fair Recent;   Fair Remote;   Fair  Judgement:  Fair  Insight:  Fair  Psychomotor Activity:  Decreased  Concentration:  Concentration: Fair  Recall:  [employer] of Knowledge:  Fair  Language:  Fair  Akathisia:  No  Handed:  Right  AIMS (if indicated):     Assets:  Desire for Improvement Housing Physical Health  ADL's:  Intact  Cognition:  WNL  Sleep:  Number of Hours: 6.75     Treatment Plan Summary: Daily contact with patient to assess and evaluate symptoms and progress in treatment, Medication management and Plan Patient is agreeable to starting Invega long-acting injectable.  Larger dose shot will be given today and I will discontinue the oral Invega.  Continue other medication.  Patient may be ready for discharge possibly by tomorrow but more likely after the weekend.  Mordecai Rasmussen, MD 07/20/2018, 4:02 PM

## 2018-07-20 NOTE — Progress Notes (Signed)
Recreation Therapy Notes  Date: 07/20/2018  Time: 9:30 am  Location: Craft Room  Behavioral response: Appropriate  Intervention Topic: Wellness  Discussion/Intervention:  Group content today was focused on Wellness. The group defined wellness and some positive ways they make decisions for themselves. Individuals expressed reasons why they neglected any wellness in the past. Patients described ways to improve wellness skills in the future. The group explained what could happen if they did not do any wellness at all. Participants express how bad choices has affected them and others around them. Individual explained the importance of wellness. The group participated in the intervention "Testing my Wellness" where they had a chance to identify some of their weaknesses and strengths in wellness.  . Clinical Observations/Feedback:  Patient came to group and explained that he could improve his wellness skills by staying focus and staying away from negative people.  Individual was social with peers and staff while participating in the intervention. Ellysia Char LRT/CTRS          Brelynn Wheller 07/20/2018 11:01 AM

## 2018-07-20 NOTE — Progress Notes (Signed)
D - Patient was in the day room upon arrival to the unit. Patient was pleasant during assessment and medication administration. Patient denies SI/HI/AVH and pain. Patient endorsees anxiety and depression rating them 6/10. Patient did state to this Clinical research associate that he had a good day.   A - Patient was compliant with medication administration per MD orders and procedures on the unit. Patient given education. Patient given support and encouragement to be active in his treatment plan. Patient informed to let staff know if there are any issues or problems on the unit.   R - Patient being monitored Q 15 minutes for safety per unit protocol. Patient remains safe on the unit.

## 2018-07-20 NOTE — [facility] Group Notes (Signed)
LCSW Group Therapy Note  07/20/2018 12:23 PM  Type of Therapy/Topic:  Group Therapy:  Balance in Life  Participation Level:  Active  Description of Group:    This group will address the concept of balance and how it feels and looks when one is unbalanced. Patients will be encouraged to process areas in their lives that are out of balance and identify reasons for remaining unbalanced. Facilitators will guide patients in utilizing problem-solving interventions to address and correct the stressor making their life unbalanced. Understanding and applying boundaries will be explored and addressed for obtaining and maintaining a balanced life. Patients will be encouraged to explore ways to assertively make their unbalanced needs known to significant others in their lives, using other group members and facilitator for support and feedback.  Therapeutic Goals: 1. Patient will identify two or more emotions or situations they have that consume much of in their lives. 2. Patient will identify signs/triggers that life has become out of balance:  3. Patient will identify two ways to set boundaries in order to achieve balance in their lives:  4. Patient will demonstrate ability to communicate their needs through discussion and/or role plays  Summary of Patient Progress: Pt was appropriate and respectful in group. Pt was able to identify what areas of life he wants to be more balanced with assistance from CSW. Pt stated that he wants to learn how to forgive others to assist with his relationships in life. Pt also reported plans for career to be in law enforcement or something similar. Pt identified coping skills as taking care of himself before he can take care of others.     Therapeutic Modalities:   Cognitive Behavioral Therapy Solution-Focused Therapy Assertiveness Training  [name], MSW, LCSW Clinical Social Work 07/20/2018 12:23 PM

## 2018-07-20 NOTE — Progress Notes (Signed)
D: Patient stated slept good last night .Stated appetite good and energy level  low. Stated concentration good . Stated on Depression scale 0, hopeless 0 and anxiety 0 .( low 0-10 high) Denies suicidal  homicidal ideations  .  No auditory hallucinations  No pain concerns . Appropriate ADL'S. Interacting with peers and staff. Patient aware of educational needs  with [facility] , able to verbalize  understanding .Compliant  with medications  Emotional and mental status improved. Attending unit programing . Voice of no safety concerns or sleeping problems . Working on coping and decision making . Able to  identify anxiety . Instructed on breathing . Patient  noted to work at choosing  the right meals  working with his diabetes blood glucose 108 at dinner  . Good eye contact when in conversation  Goal today focus better.   A: Encourage patient participation with unit programming . Instruction  Given on  Medication , verbalize understanding.  R: Voice no other concerns. Staff continue to monitor

## 2018-07-20 NOTE — Plan of Care (Signed)
Patient slept through the night and said he slept well.    Problem: Activity: Goal: Imbalance in normal sleep/wake cycle will improve Outcome: Progressing

## 2018-07-20 NOTE — Plan of Care (Signed)
Patient aware of educational needs  with [facility] , able to verbalize  understanding .Compliant  with medications  Emotional and mental status improved. Attending unit programing . Voice of no safety concerns or sleeping problems . Working on coping and decision making . Able to  identify anxiety . Instructed on breathing . Patient  noted to work at choosing  the right meals  working with his diabetes blood glucose 108 at dinner  . Good eye contact when in conversation    Problem: Education: Goal: Utilization of techniques to improve thought processes will improve Outcome: Progressing Goal: Knowledge of the prescribed therapeutic regimen will improve Outcome: Progressing   Problem: Activity: Goal: Interest or engagement in leisure activities will improve Outcome: Progressing Goal: Imbalance in normal sleep/wake cycle will improve Outcome: Progressing   Problem: Coping: Goal: Coping ability will improve Outcome: Progressing Goal: Will verbalize feelings Outcome: Progressing   Problem: Health Behavior/Discharge Planning: Goal: Ability to make decisions will improve Outcome: Progressing Goal: Compliance with therapeutic regimen will improve Outcome: Progressing   Problem: Education: Goal: Ability to state activities that reduce stress will improve Outcome: Progressing   Problem: Coping: Goal: Ability to identify and develop effective coping behavior will improve Outcome: Progressing   Problem: Self-Concept: Goal: Ability to identify factors that promote anxiety will improve Outcome: Progressing Goal: Level of anxiety will decrease Outcome: Progressing Goal: Ability to modify response to factors that promote anxiety will improve Outcome: Progressing   Problem: Education: Goal: Ability to describe self-care measures that may prevent or decrease complications (Diabetes Survival Skills Education) will improve Outcome: Progressing Goal: Individualized Educational  Video(s) Outcome: Progressing   Problem: Coping: Goal: Ability to adjust to condition or change in health will improve Outcome: Progressing   Problem: Health Behavior/Discharge Planning: Goal: Ability to identify and utilize available resources and services will improve Outcome: Progressing Goal: Ability to manage health-related needs will improve Outcome: Progressing   Problem: Activity: Goal: Will identify at least one activity in which they can participate Outcome: Progressing   Problem: Coping: Goal: Ability to identify and develop effective coping behavior will improve Outcome: Progressing Goal: Ability to interact with others will improve Outcome: Progressing Goal: Demonstration of participation in decision-making regarding own care will improve Outcome: Progressing Goal: Ability to use eye contact when communicating with others will improve Outcome: Progressing   Problem: Self-Concept: Goal: Will verbalize positive feelings about self Outcome: Progressing

## 2018-07-20 NOTE — Progress Notes (Signed)
D: Pt during assessments denies SI/HI/AVH, able to contract for safety. Pt is pleasant and cooperative, engages good. Pt. has no Complaints. Patient interactions good. Pt. Endorses a normal mood.   A: Q x 15 minute observation checks were completed for safety. Patient was provided with education. Patient was given/offered medications per orders. Patient  was encourage to attend groups, participate in unit activities and continue with plan of care. Pt. Chart and plans of care reviewed. Pt. Given support and encouragement.   R: Patient is complaint with medications and unit procedures. Pt. Blood sugars monitored per MD orders. Pt. Attends snack time, eating good. Pt. Attends group. Pt. Covid screened. Pt. Up randomly during the night singing in his room, given PRN medications for comfort.             Precautionary checks every 15 minutes for safety maintained, room free of safety hazards, patient sustains no injury or falls during this shift. Will endorse care to next shift.

## 2018-07-20 NOTE — Plan of Care (Signed)
Pt. Is complaint with medications. Pt. Interactions are appropriate. Pt. Endorses a normal mood. Pt. Denies si/hi/avh, contracts for safety.   Problem: Health Behavior/Discharge Planning: Goal: Compliance with therapeutic regimen will improve Outcome: Progressing   Problem: Coping: Goal: Ability to interact with others will improve Outcome: Progressing

## 2018-07-20 NOTE — Progress Notes (Signed)
Patients blood sugar was 131 this evening. Patient refused 2 units of insulin (Novalog) but did take his 20 units of insulin (Lantus). Patient given education, patient still refused the 2 units.

## 2018-07-21 LAB — GLUCOSE, CAPILLARY
Glucose-Capillary: 76 mg/dL (ref 70–99)
Glucose-Capillary: 219 mg/dL — ABNORMAL HIGH (ref 70–99)
Glucose-Capillary: 87 mg/dL (ref 70–99)
Glucose-Capillary: 125 mg/dL — ABNORMAL HIGH (ref 70–99)

## 2018-07-21 MED ORDER — INSULIN GLARGINE 100 UNIT/ML ~~LOC~~ SOLN
10.0000 [IU] | Freq: Every day | SUBCUTANEOUS | Status: DC
  Administered 2018-07-21 – 2018-07-23 (×3): 10 [IU] via SUBCUTANEOUS
  Filled 2018-07-21 (×4): qty 0.1

## 2018-07-21 MED ORDER — LITHIUM CARBONATE ER 450 MG PO TBCR
900.0000 mg | EXTENDED_RELEASE_TABLET | Freq: Every day | ORAL | Status: DC
  Administered 2018-07-21 – 2018-07-23 (×3): 900 mg via ORAL
  Filled 2018-07-21 (×3): qty 2

## 2018-07-21 NOTE — Progress Notes (Signed)
[facility]  07/21/2018 5:58 PM Fenton FoyDavid H Fugere  MRN:  [id number] Subjective: Patient seen chart reviewed.  Patient reports no new complaints.  He does tell me that last night he was up in the middle of the night singing and that the nurses had to come in and ask him to quiet down.  He denies having any mood problems during the day.  Denies any current hallucinations.  Patient mostly stays quiet but he does interact in groups has been going outside has been taking care of his hygiene and is self-care very well.  No complaints about any of his medicine.  No side effects noted.  Lithium level came back essentially 0 when it was checked. Principal Problem: Schizophrenia (HCC) Diagnosis: Principal Problem:   Schizophrenia (HCC) Active Problems:   Hypertension   Type 2 diabetes mellitus (HCC)  Total Time spent with patient: 30 minutes  Past Psychiatric History: Patient has a history of schizophrenia and is in the process of transitioning into independent living  Past Medical History:  Past Medical History:  Diagnosis Date  . Bipolar affective disorder (HCC)    takes Zyprexa daily  . Diabetes mellitus    takes Victoza,Metformin,and Glipizide daily  . Hypertension    takes Amlodipine,Lisinopril and Clonidine daily  . Hyponatremia    history of  . Mental disorder    takes Lithium daily  . Schizoaffective disorder    takes Trazodone nightly  . Seasonal allergies    takes Claritin daily  . Sleep apnea    sleep study >219yrs ago  . Stroke [facility](HCC)    left arm weakness    Past Surgical History:  Procedure Laterality Date  . CATARACT EXTRACTION W/PHACO Right 02/14/2013   Procedure: CATARACT EXTRACTION PHACO AND INTRAOCULAR LENS PLACEMENT (IOC);  Surgeon: Shade FloodGreer Geiger, MD;  [facility];  Service: Ophthalmology;  Laterality: Right;  . CATARACT EXTRACTION W/PHACO Left 06/13/2013   Procedure: CATARACT EXTRACTION PHACO AND INTRAOCULAR LENS PLACEMENT (IOC);  Surgeon: Shade FloodGreer Geiger, MD;   [facility];  Service: Ophthalmology;  Laterality: Left;  . CIRCUMCISION  20 yrs. ago  . EYE SURGERY     Family History: History reviewed. No pertinent family history. Family Psychiatric  History: Family history unknown Social History:  Social History   Substance and Sexual Activity  Alcohol Use No     Social History   Substance and Sexual Activity  Drug Use No    Social History   Socioeconomic History  . Marital status: Divorced    Spouse name: Not on file  . Number of children: Not on file  . Years of education: Not on file  . Highest education level: Not on file  Occupational History  . Not on file  Social Needs  . Financial resource strain: Not on file  . Food insecurity:    Worry: Not on file    Inability: Not on file  . Transportation needs:    Medical: Not on file    Non-medical: Not on file  Tobacco Use  . Smoking status: Never Smoker  . Smokeless tobacco: Never Used  Substance and Sexual Activity  . Alcohol use: No  . Drug use: No  . Sexual activity: Yes    Birth control/protection: None  Lifestyle  . Physical activity:    Days per week: Not on file    Minutes per session: Not on file  . Stress: Not on file  Relationships  . Social connections:    Talks on  phone: Not on file    Gets together: Not on file    Attends religious service: Not on file    Active member of club or organization: Not on file    Attends meetings of clubs or organizations: Not on file    Relationship status: Not on file  Other Topics Concern  . Not on file  Social History Narrative  . Not on file   Additional Social History:                         Sleep: Fair  Appetite:  Fair  Current Medications: Current Facility-Administered Medications  Medication Dose Route Frequency Provider Last Rate Last Dose  . acetaminophen (TYLENOL) tablet 650 mg  650 mg Oral Q6H PRN Mariel Craft, MD      . alum & mag hydroxide-simeth (MAALOX/MYLANTA) 200-200-20 MG/5ML  suspension 30 mL  30 mL Oral Q4H PRN Mariel Craft, MD      . amLODipine (NORVASC) tablet 10 mg  10 mg Oral Daily Mariel Craft, MD   10 mg at 07/21/18 1610  . aspirin EC tablet 81 mg  81 mg Oral Daily Mariel Craft, MD   81 mg at 07/21/18 0753  . cloNIDine (CATAPRES) tablet 0.2 mg  0.2 mg Oral BID Mariel Craft, MD   0.2 mg at 07/21/18 1658  . fluticasone (FLONASE) 50 MCG/ACT nasal spray 2 spray  2 spray Each Nare Daily Mariel Craft, MD   2 spray at 07/21/18 0805  . glipiZIDE (GLUCOTROL XL) 24 hr tablet 10 mg  10 mg Oral Daily Mariel Craft, MD   10 mg at 07/21/18 9604  . hydrOXYzine (ATARAX/VISTARIL) tablet 25 mg  25 mg Oral TID PRN Mariel Craft, MD   25 mg at 07/21/18 0135  . insulin aspart (novoLOG) injection 0-15 Units  0-15 Units Subcutaneous TID WC Kharson Rasmusson, Jackquline Denmark, MD   5 Units at 07/21/18 1144  . insulin glargine (LANTUS) injection 10 Units  10 Units Subcutaneous QHS Shana Younge T, MD      . lisinopril (ZESTRIL) tablet 40 mg  40 mg Oral Daily Mariel Craft, MD   40 mg at 07/21/18 0753  . lithium carbonate (LITHOBID) CR tablet 600 mg  600 mg Oral QHS Naomee Nowland T, MD   600 mg at 07/20/18 2142  . loratadine (CLARITIN) tablet 10 mg  10 mg Oral Daily Mariel Craft, MD   10 mg at 07/21/18 5409  . ziprasidone (GEODON) injection 20 mg  20 mg Intramuscular Q12H PRN Mariel Craft, MD       And  . LORazepam (ATIVAN) tablet 1 mg  1 mg Oral PRN Mariel Craft, MD      . magnesium hydroxide (MILK OF MAGNESIA) suspension 30 mL  30 mL Oral Daily PRN Mariel Craft, MD      . metFORMIN (GLUCOPHAGE) tablet 1,000 mg  1,000 mg Oral BID WC Mariel Craft, MD   1,000 mg at 07/21/18 1658  . polyethylene glycol (MIRALAX / GLYCOLAX) packet 17 g  17 g Oral Daily Mariel Craft, MD   17 g at 07/21/18 0803  . ziprasidone (GEODON) capsule 40 mg  40 mg Oral BID WC Mariel Craft, MD   40 mg at 07/21/18 1658    Lab Results:  Results for orders placed or performed during  the hospital encounter of 07/18/18 (from the past 48 hour(s))  Glucose, capillary     Status: Abnormal   Collection Time: 07/19/18  9:43 PM  Result Value Ref Range   Glucose-Capillary 131 (H) 70 - 99 mg/dL  Glucose, capillary     Status: Abnormal   Collection Time: 07/20/18  6:58 AM  Result Value Ref Range   Glucose-Capillary 181 (H) 70 - 99 mg/dL  Glucose, capillary     Status: Abnormal   Collection Time: 07/20/18 11:04 AM  Result Value Ref Range   Glucose-Capillary 263 (H) 70 - 99 mg/dL   Comment 1 Notify RN   Glucose, capillary     Status: Abnormal   Collection Time: 07/20/18  4:02 PM  Result Value Ref Range   Glucose-Capillary 108 (H) 70 - 99 mg/dL   Comment 1 Notify RN   Glucose, capillary     Status: Abnormal   Collection Time: 07/20/18  8:53 PM  Result Value Ref Range   Glucose-Capillary 139 (H) 70 - 99 mg/dL   Comment 1 Notify RN   Glucose, capillary     Status: None   Collection Time: 07/21/18  7:02 AM  Result Value Ref Range   Glucose-Capillary 76 70 - 99 mg/dL   Comment 1 Notify RN   Glucose, capillary     Status: Abnormal   Collection Time: 07/21/18 11:40 AM  Result Value Ref Range   Glucose-Capillary 219 (H) 70 - 99 mg/dL  Glucose, capillary     Status: None   Collection Time: 07/21/18  4:37 PM  Result Value Ref Range   Glucose-Capillary 87 70 - 99 mg/dL    Blood Alcohol level:  Lab Results  Component Value Date   ETH <10 07/18/2018   ETH <10 12/31/2017    Metabolic Disorder Labs: Lab Results  Component Value Date   HGBA1C 12.6 (H) 07/18/2018   MPG 314.92 07/18/2018   MPG 128 10/02/2014   No results found for: PROLACTIN Lab Results  Component Value Date   CHOL 151 07/18/2018   TRIG 122 07/18/2018   HDL 44 07/18/2018   CHOLHDL 3.4 07/18/2018   VLDL 24 07/18/2018   LDLCALC 83 07/18/2018   LDLCALC 70 01/27/2011    Physical Findings: AIMS: Facial and Oral Movements Muscles of Facial Expression: None, normal Lips and Perioral Area: None,  normal Jaw: None, normal Tongue: None, normal,Extremity Movements Upper (arms, wrists, hands, fingers): None, normal Lower (legs, knees, ankles, toes): None, normal, Trunk Movements Neck, shoulders, hips: None, normal, Overall Severity Severity of abnormal movements (highest score from questions above): None, normal Incapacitation due to abnormal movements: None, normal Patient's awareness of abnormal movements (rate only patient's report): No Awareness, Dental Status Current problems with teeth and/or dentures?: No Does patient usually wear dentures?: No  CIWA:  CIWA-Ar Total: 2 COWS:  COWS Total Score: 3  Musculoskeletal: Strength & Muscle Tone: within normal limits Gait & Station: normal Patient leans: N/A  Psychiatric Specialty Exam: Physical Exam  Nursing note and vitals reviewed. Constitutional: He appears well-developed and well-nourished.  HENT:  Head: Normocephalic and atraumatic.  Eyes: Pupils are equal, round, and reactive to light. Conjunctivae are normal.  Neck: Normal range of motion.  Cardiovascular: Regular rhythm and normal heart sounds.  Respiratory: Effort normal.  GI: Soft.  Musculoskeletal: Normal range of motion.  Neurological: He is alert.  Skin: Skin is warm and dry.  Psychiatric: He has a normal mood and affect. His speech is normal. Judgment and thought content normal. He is agitated. He is not aggressive. He exhibits abnormal  recent memory.    Review of Systems  Constitutional: Negative.   HENT: Negative.   Eyes: Negative.   Respiratory: Negative.   Cardiovascular: Negative.   Gastrointestinal: Negative.   Musculoskeletal: Negative.   Skin: Negative.   Neurological: Negative.   Psychiatric/Behavioral: Negative.     Blood pressure 118/77, pulse 86, temperature 98.5 F (36.9 C), temperature source Oral, resp. rate 18, height  (1.626 m), weight 106.1 kg, SpO2 99 %.Body mass index is 40.15 kg/m.  General Appearance: Casual  Eye Contact:   Good  Speech:  Clear and Coherent  Volume:  Normal  Mood:  Euthymic  Affect:  Constricted  Thought Process:  Disorganized  Orientation:  Full (Time, Place, and Person)  Thought Content:  Rumination  Suicidal Thoughts:  No  Homicidal Thoughts:  No  Memory:  Immediate;   Fair Recent;   Fair Remote;   Fair  Judgement:  Fair  Insight:  Fair  Psychomotor Activity:  Decreased  Concentration:  Concentration: Fair  Recall:  [employer] of Knowledge:  Fair  Language:  Fair  Akathisia:  No  Handed:  Right  AIMS (if indicated):     Assets:  Desire for Improvement Housing Physical Health Resilience  ADL's:  Intact  Cognition:  Impaired,  Mild  Sleep:  Number of Hours: 5     Treatment Plan Summary: Daily contact with patient to assess and evaluate symptoms and progress in treatment, Medication management and Plan Increasing lithium dose to 900 at night.  Continue other medication.  Patient is transitioning to long-acting injectable and we will try to give him another one on Monday before discharge which is likely to be Monday or Tuesday at the latest.  Mordecai Rasmussen, MD 07/21/2018, 5:58 PM

## 2018-07-21 NOTE — Plan of Care (Signed)
Patient is appropriate  and engaging in a meaningful manner, denies any SI/HI/AVH , patient is showing great improvement health wise, no physical  complaint, attending groups and engaging in leisure activities,  compliant with his medications , denies any depression and anxiety only requiring 15 minutes safety checks, sleep is not interrupted no distress.   Problem: Education: Goal: Utilization of techniques to improve thought processes will improve 07/21/2018 2355 by Lelan Pons, RN Outcome: Progressing 07/21/2018 2349 by Lelan Pons, RN Outcome: Progressing Goal: Knowledge of the prescribed therapeutic regimen will improve 07/21/2018 2355 by Lelan Pons, RN Outcome: Progressing 07/21/2018 2349 by Lelan Pons, RN Outcome: Progressing   Problem: Activity: Goal: Interest or engagement in leisure activities will improve 07/21/2018 2355 by Lelan Pons, RN Outcome: Progressing 07/21/2018 2349 by Lelan Pons, RN Outcome: Progressing Goal: Imbalance in normal sleep/wake cycle will improve 07/21/2018 2355 by Lelan Pons, RN Outcome: Progressing 07/21/2018 2349 by Lelan Pons, RN Outcome: Progressing   Problem: Coping: Goal: Coping ability will improve 07/21/2018 2355 by Lelan Pons, RN Outcome: Progressing 07/21/2018 2349 by Lelan Pons, RN Outcome: Progressing Goal: Will verbalize feelings 07/21/2018 2355 by Lelan Pons, RN Outcome: Progressing 07/21/2018 2349 by Lelan Pons, RN Outcome: Progressing   Problem: Health Behavior/Discharge Planning: Goal: Ability to make decisions will improve 07/21/2018 2355 by Lelan Pons, RN Outcome: Progressing 07/21/2018 2349 by Lelan Pons, RN Outcome: Progressing Goal: Compliance with therapeutic regimen will improve 07/21/2018 2355 by Lelan Pons, RN Outcome: Progressing 07/21/2018 2349 by Lelan Pons, RN Outcome: Progressing   Problem:  Education: Goal: Ability to state activities that reduce stress will improve 07/21/2018 2355 by Lelan Pons, RN Outcome: Progressing 07/21/2018 2349 by Lelan Pons, RN Outcome: Progressing   Problem: Coping: Goal: Ability to identify and develop effective coping behavior will improve 07/21/2018 2355 by Lelan Pons, RN Outcome: Progressing 07/21/2018 2349 by Lelan Pons, RN Outcome: Progressing   Problem: Self-Concept: Goal: Ability to identify factors that promote anxiety will improve 07/21/2018 2355 by Lelan Pons, RN Outcome: Progressing 07/21/2018 2349 by Lelan Pons, RN Outcome: Progressing Goal: Level of anxiety will decrease 07/21/2018 2355 by Lelan Pons, RN Outcome: Progressing 07/21/2018 2349 by Lelan Pons, RN Outcome: Progressing Goal: Ability to modify response to factors that promote anxiety will improve 07/21/2018 2355 by Lelan Pons, RN Outcome: Progressing 07/21/2018 2349 by Lelan Pons, RN Outcome: Progressing   Problem: Education: Goal: Ability to describe self-care measures that may prevent or decrease complications (Diabetes Survival Skills Education) will improve 07/21/2018 2355 by Lelan Pons, RN Outcome: Progressing 07/21/2018 2349 by Lelan Pons, RN Outcome: Progressing Goal: Individualized Educational Video(s) 07/21/2018 2355 by Lelan Pons, RN Outcome: Progressing 07/21/2018 2349 by Lelan Pons, RN Outcome: Progressing   Problem: Coping: Goal: Ability to adjust to condition or change in health will improve 07/21/2018 2355 by Lelan Pons, RN Outcome: Progressing 07/21/2018 2349 by Lelan Pons, RN Outcome: Progressing   Problem: Health Behavior/Discharge Planning: Goal: Ability to identify and utilize available resources and services will improve 07/21/2018 2355 by Lelan Pons, RN Outcome: Progressing 07/21/2018 2349 by Lelan Pons,  RN Outcome: Progressing Goal: Ability to manage health-related needs will improve 07/21/2018 2355 by Lelan Pons, RN Outcome: Progressing 07/21/2018 2349 by Lelan Pons, RN Outcome: Progressing   Problem: Activity: Goal: Will identify at least one activity in which they can participate 07/21/2018 2355 by Lelan Pons, RN Outcome: Progressing 07/21/2018 2349 by Lelan Pons, RN Outcome: Progressing   Problem: Coping: Goal: Ability to identify and develop effective coping behavior will improve 07/21/2018 2355  by Lelan PonsAriwodo, Aviance Cooperwood, RN Outcome: Progressing 07/21/2018 2349 by Lelan PonsAriwodo, Gary Gabrielsen, RN Outcome: Progressing Goal: Ability to interact with others will improve 07/21/2018 2355 by Lelan PonsAriwodo, Avinash Maltos, RN Outcome: Progressing 07/21/2018 2349 by Lelan PonsAriwodo, Katurah Karapetian, RN Outcome: Progressing Goal: Demonstration of participation in decision-making regarding own care will improve 07/21/2018 2355 by Lelan PonsAriwodo, Athena Baltz, RN Outcome: Progressing 07/21/2018 2349 by Lelan PonsAriwodo, Shelton Soler, RN Outcome: Progressing Goal: Ability to use eye contact when communicating with others will improve 07/21/2018 2355 by Lelan PonsAriwodo, Addalynne Golding, RN Outcome: Progressing 07/21/2018 2349 by Lelan PonsAriwodo, Floree Zuniga, RN Outcome: Progressing   Problem: Self-Concept: Goal: Will verbalize positive feelings about self 07/21/2018 2355 by Lelan PonsAriwodo, Kaycee Mcgaugh, RN Outcome: Progressing 07/21/2018 2349 by Lelan PonsAriwodo, Delailah Spieth, RN Outcome: Progressing

## 2018-07-21 NOTE — Progress Notes (Addendum)
Inpatient Diabetes Program Recommendations  AACE/ADA: New Consensus Statement on Inpatient Glycemic Control (2015)  Target Ranges:  Prepandial:   less than 140 mg/dL      Peak postprandial:   less than 180 mg/dL (1-2 hours)      Critically ill patients:  140 - 180 mg/dL   Lab Results  Component Value Date   GLUCAP 76 07/21/2018   HGBA1C 12.6 (H) 07/18/2018    Review of Glycemic Control Results for CAVELL, CANCEL (MRN [id number]) as of 07/21/2018 10:41  Ref. Range 07/20/2018 06:58 07/20/2018 11:04 07/20/2018 16:02 07/20/2018 20:53 07/21/2018 07:02  Glucose-Capillary Latest Ref Range: 70 - 99 mg/dL 268 (H) 341 (H) 962 (H) 139 (H) 76  Diabetes history: DM 2 Outpatient Diabetes medications:  Glucotrol XL 10 mg daily, Novolog 0-15 units tid with meals, Victoza 1.5 mg daily, Metformin 1000 mg bid Current orders for Inpatient glycemic control: Novolog moderate tid with meals, Metformin 1000 mg bid, Glipizide XL 10 mg daily, Lantus 20 units daily Inpatient Diabetes Program Recommendations:   Please reduce Lantus to 10 units daily.  Will speak to patient regarding home diabetes medications and care today.   Thanks,  Beryl Meager, RN, BC-ADM Inpatient Diabetes Coordinator Pager [phone] (8a-5p)  1450: Spoke to patient by phone regarding home DM management. Patient states that he "ran out" of medications and needs new prescriptions. He states that he "takes insulin" at home with each meal.  Unsure if this is a SSI?  Based on how patient has responded to medications in the hospital, he likely does not need insulin or the risks.   -At d/c, consider resumption of Metformin, Glipizide, and Victoza.  Would not resume insulin.  Discussed importance of glycemic control with patient.  Thanks!!

## 2018-07-21 NOTE — Progress Notes (Signed)
Recreation Therapy Notes  Date: 07/21/2018  Time: 9:30 am  Location: Craft Room  Behavioral response: Appropriate  Intervention Topic: Happiness  Discussion/Intervention:  Group content today was focused on Happiness. The group defined happiness and described where happiness comes from. Individuals identified what makes them happy and how they go about making others happy. Patients expressed things that stop them from being happy and ways they can improve their happiness. The group stated reasons why it is important to be happy. The group participated in the intervention "My Happiness", where they had a chance to identify and express things that make them happy. Clinical Observations/Feedback:  Patient came to group and stated that happiness comes from [place], love and friendship. He explained that sometimes his family stops him from being happy. Participant expressed that watching television and his friend Fatima Blank makes him happy. Individual was social with peers and staff while participating in the intervention. Jaton Eilers LRT/CTRS            Daveena Elmore 07/21/2018 10:47 AM

## 2018-07-21 NOTE — Progress Notes (Signed)
CSW spoke with Josetta Huddle [phone] to inform her that pt is scheduled to be discharged on Monday. Lucky Rathke reported that [facility] will be picking pt up Monday and taking him home to assist him with handling the woman KeeKee who is at his home and is not supposed to be there. CSW also informed Lucky Rathke that pt will be getting the invega injection as well.  [name], MSW, LCSW Clinical Social Work 07/21/2018 10:03 AM

## 2018-07-21 NOTE — [facility] Group Notes (Signed)
LCSW Group Therapy Note  07/21/2018 1:00 PM  Type of Therapy and Topic:  Group Therapy:  Feelings around Relapse and Recovery  Participation Level:  Active   Description of Group:    Patients in this group will discuss emotions they experience before and after a relapse. They will process how experiencing these feelings, or avoidance of experiencing them, relates to having a relapse. Facilitator will guide patients to explore emotions they have related to recovery. Patients will be encouraged to process which emotions are more powerful. They will be guided to discuss the emotional reaction significant others in their lives may have to their relapse or recovery. Patients will be assisted in exploring ways to respond to the emotions of others without this contributing to a relapse.  Therapeutic Goals: 1. Patient will identify two or more emotions that lead to a relapse for them 2. Patient will identify two emotions that result when they relapse 3. Patient will identify two emotions related to recovery 4. Patient will demonstrate ability to communicate their needs through discussion and/or role plays   Summary of Patient Progress: Patient arrived late for group.  Patient was an active participant who reports feelings og "shame, fear, hopelessness, guilt and madness" in relation to relapsing.  Patient reported how his family has been a trigger and he has had to work on removing negative influences from his life.  Patient engaged others in discussion on coping skills.    Therapeutic Modalities:   Cognitive Behavioral Therapy Solution-Focused Therapy Assertiveness Training Relapse Prevention Therapy   Penni Homans, MSW, LCSW 07/21/2018 12:42 PM

## 2018-07-21 NOTE — Progress Notes (Signed)
Pt. Awake sitting in his room in a chair, singing to himself. Pt. Redirected from this, due to other patient's sleeping. Pt. After speaking to this writer given prn medication for comfort. Will continue to monitor.

## 2018-07-21 NOTE — Plan of Care (Signed)
Patient is pleasant and cooperative on approach.Patient denies SI,HI and AVH.States "I am hearing only Jesus."Appropriate with staff & peers.Compliant with medications.Attended groups.Appetite and energy level good.Support and encouragement given.

## 2018-07-22 LAB — GLUCOSE, CAPILLARY
Glucose-Capillary: 98 mg/dL (ref 70–99)
Glucose-Capillary: 165 mg/dL — ABNORMAL HIGH (ref 70–99)
Glucose-Capillary: 110 mg/dL — ABNORMAL HIGH (ref 70–99)
Glucose-Capillary: 206 mg/dL — ABNORMAL HIGH (ref 70–99)

## 2018-07-22 NOTE — Plan of Care (Signed)
Patient is alert and oriented. Present in the milieu sporadically with a steady gait. Requested double portions, MD made aware and per MD, "That shouldn't be a problem."  Compliant with treatment plan, BS checked throughout the shift. Denies SI/HI/AVH and pain at this time. Continues to endorse some anxiety, prn medication administered with effectiveness. Will continue to monitor.

## 2018-07-22 NOTE — Progress Notes (Signed)
[facility] MD Progress Note  07/22/2018 1:17 PM Mitchell [name]  MRN:  [id number] Subjective: Follow-up for this gentleman with schizophrenia.  He has no specific complaints to me.  Says his mood is feeling fine.  Does not report any delusional thinking.  Certainly has not been aggressive or dangerous here.  Takes care of his ADLs okay.  Cooperates with medicine.  No sign of any side effects from any of his medicine.  He had expressed a request to the staff that he get double portions.  Looks like his blood sugars on current medicine are not too bad. Principal Problem: Schizophrenia (HCC) Diagnosis: Principal Problem:   Schizophrenia (HCC) Active Problems:   Hypertension   Type 2 diabetes mellitus (HCC)  Total Time spent with patient: 30 minutes  Past Psychiatric History: Patient has a history of schizophrenia and is in the process of transitioning into independent living in the community.  Ran into a hiccup during that process with noncompliance and probably being taken advantage but seems to be mentally pretty much back to baseline.  Past Medical History:  Past Medical History:  Diagnosis Date  . Bipolar affective disorder (HCC)    takes Zyprexa daily  . Diabetes mellitus    takes Victoza,Metformin,and Glipizide daily  . Hypertension    takes Amlodipine,Lisinopril and Clonidine daily  . Hyponatremia    history of  . Mental disorder    takes Lithium daily  . Schizoaffective disorder    takes Trazodone nightly  . Seasonal allergies    takes Claritin daily  . Sleep apnea    sleep study >13yrs ago  . Stroke [facility])    left arm weakness    Past Surgical History:  Procedure Laterality Date  . CATARACT EXTRACTION W/PHACO Right 02/14/2013   Procedure: CATARACT EXTRACTION PHACO AND INTRAOCULAR LENS PLACEMENT (IOC);  Surgeon: Shade Flood, MD;  Location: [facility] OR;  Service: Ophthalmology;  Laterality: Right;  . CATARACT EXTRACTION W/PHACO Left 06/13/2013   Procedure: CATARACT EXTRACTION PHACO AND  INTRAOCULAR LENS PLACEMENT (IOC);  Surgeon: Shade Flood, MD;  Location: [facility] OR;  Service: Ophthalmology;  Laterality: Left;  . CIRCUMCISION  20 yrs. ago  . EYE SURGERY     Family History: History reviewed. No pertinent family history. Family Psychiatric  History: See previous Social History:  Social History   Substance and Sexual Activity  Alcohol Use No     Social History   Substance and Sexual Activity  Drug Use No    Social History   Socioeconomic History  . Marital status: Divorced    Spouse name: Not on file  . Number of children: Not on file  . Years of education: Not on file  . Highest education level: Not on file  Occupational History  . Not on file  Social Needs  . Financial resource strain: Not on file  . Food insecurity:    Worry: Not on file    Inability: Not on file  . Transportation needs:    Medical: Not on file    Non-medical: Not on file  Tobacco Use  . Smoking status: Never Smoker  . Smokeless tobacco: Never Used  Substance and Sexual Activity  . Alcohol use: No  . Drug use: No  . Sexual activity: Yes    Birth control/protection: None  Lifestyle  . Physical activity:    Days per week: Not on file    Minutes per session: Not on file  . Stress: Not on file  Relationships  . Social  connections:    Talks on phone: Not on file    Gets together: Not on file    Attends religious service: Not on file    Active member of club or organization: Not on file    Attends meetings of clubs or organizations: Not on file    Relationship status: Not on file  Other Topics Concern  . Not on file  Social History Narrative  . Not on file   Additional Social History:                         Sleep: Fair  Appetite:  Fair  Current Medications: Current Facility-Administered Medications  Medication Dose Route Frequency Provider Last Rate Last Dose  . acetaminophen (TYLENOL) tablet 650 mg  650 mg Oral Q6H PRN Mariel Craft, MD      . alum & mag  hydroxide-simeth (MAALOX/MYLANTA) 200-200-20 MG/5ML suspension 30 mL  30 mL Oral Q4H PRN Mariel Craft, MD      . amLODipine (NORVASC) tablet 10 mg  10 mg Oral Daily Mariel Craft, MD   10 mg at 07/22/18 1610  . aspirin EC tablet 81 mg  81 mg Oral Daily Mariel Craft, MD   81 mg at 07/22/18 9604  . cloNIDine (CATAPRES) tablet 0.2 mg  0.2 mg Oral BID Mariel Craft, MD   0.2 mg at 07/22/18 5409  . fluticasone (FLONASE) 50 MCG/ACT nasal spray 2 spray  2 spray Each Nare Daily Mariel Craft, MD   2 spray at 07/22/18 [phone]  . glipiZIDE (GLUCOTROL XL) 24 hr tablet 10 mg  10 mg Oral Daily Mariel Craft, MD   10 mg at 07/22/18 0816  . hydrOXYzine (ATARAX/VISTARIL) tablet 25 mg  25 mg Oral TID PRN Mariel Craft, MD   25 mg at 07/21/18 0135  . insulin aspart (novoLOG) injection 0-15 Units  0-15 Units Subcutaneous TID WC Lurae Hornbrook, Jackquline Denmark, MD   3 Units at 07/22/18 1123  . insulin glargine (LANTUS) injection 10 Units  10 Units Subcutaneous QHS Lakhia Gengler, Jackquline Denmark, MD   10 Units at 07/21/18 2130  . lisinopril (ZESTRIL) tablet 40 mg  40 mg Oral Daily Mariel Craft, MD   40 mg at 07/22/18 1478  . lithium carbonate (ESKALITH) CR tablet 900 mg  900 mg Oral QHS Terah Robey, Jackquline Denmark, MD   900 mg at 07/21/18 2130  . loratadine (CLARITIN) tablet 10 mg  10 mg Oral Daily Mariel Craft, MD   10 mg at 07/22/18 2956  . ziprasidone (GEODON) injection 20 mg  20 mg Intramuscular Q12H PRN Mariel Craft, MD       And  . LORazepam (ATIVAN) tablet 1 mg  1 mg Oral PRN Mariel Craft, MD      . magnesium hydroxide (MILK OF MAGNESIA) suspension 30 mL  30 mL Oral Daily PRN Mariel Craft, MD      . metFORMIN (GLUCOPHAGE) tablet 1,000 mg  1,000 mg Oral BID WC Mariel Craft, MD   1,000 mg at 07/22/18 2130  . polyethylene glycol (MIRALAX / GLYCOLAX) packet 17 g  17 g Oral Daily Mariel Craft, MD   17 g at 07/22/18 0809  . ziprasidone (GEODON) capsule 40 mg  40 mg Oral BID WC Mariel Craft, MD   40 mg at  07/22/18 8657    Lab Results:  Results for orders placed or performed during the hospital  encounter of 07/18/18 (from the past 48 hour(s))  Glucose, capillary     Status: Abnormal   Collection Time: 07/20/18  4:02 PM  Result Value Ref Range   Glucose-Capillary 108 (H) 70 - 99 mg/dL   Comment 1 Notify RN   Glucose, capillary     Status: Abnormal   Collection Time: 07/20/18  8:53 PM  Result Value Ref Range   Glucose-Capillary 139 (H) 70 - 99 mg/dL   Comment 1 Notify RN   Glucose, capillary     Status: None   Collection Time: 07/21/18  7:02 AM  Result Value Ref Range   Glucose-Capillary 76 70 - 99 mg/dL   Comment 1 Notify RN   Glucose, capillary     Status: Abnormal   Collection Time: 07/21/18 11:40 AM  Result Value Ref Range   Glucose-Capillary 219 (H) 70 - 99 mg/dL  Glucose, capillary     Status: None   Collection Time: 07/21/18  4:37 PM  Result Value Ref Range   Glucose-Capillary 87 70 - 99 mg/dL  Glucose, capillary     Status: Abnormal   Collection Time: 07/21/18  9:29 PM  Result Value Ref Range   Glucose-Capillary 125 (H) 70 - 99 mg/dL  Glucose, capillary     Status: None   Collection Time: 07/22/18  7:21 AM  Result Value Ref Range   Glucose-Capillary 98 70 - 99 mg/dL  Glucose, capillary     Status: Abnormal   Collection Time: 07/22/18 11:20 AM  Result Value Ref Range   Glucose-Capillary 165 (H) 70 - 99 mg/dL    Blood Alcohol level:  Lab Results  Component Value Date   ETH <10 07/18/2018   ETH <10 12/31/2017    Metabolic Disorder Labs: Lab Results  Component Value Date   HGBA1C 12.6 (H) 07/18/2018   MPG 314.92 07/18/2018   MPG 128 10/02/2014   No results found for: PROLACTIN Lab Results  Component Value Date   CHOL 151 07/18/2018   TRIG 122 07/18/2018   HDL 44 07/18/2018   CHOLHDL 3.4 07/18/2018   VLDL 24 07/18/2018   LDLCALC 83 07/18/2018   LDLCALC 70 01/27/2011    Physical Findings: AIMS: Facial and Oral Movements Muscles of Facial Expression:  None, normal Lips and Perioral Area: None, normal Jaw: None, normal Tongue: None, normal,Extremity Movements Upper (arms, wrists, hands, fingers): None, normal Lower (legs, knees, ankles, toes): None, normal, Trunk Movements Neck, shoulders, hips: None, normal, Overall Severity Severity of abnormal movements (highest score from questions above): None, normal Incapacitation due to abnormal movements: None, normal Patient's awareness of abnormal movements (rate only patient's report): No Awareness, Dental Status Current problems with teeth and/or dentures?: No Does patient usually wear dentures?: No  CIWA:  CIWA-Ar Total: 2 COWS:  COWS Total Score: 3  Musculoskeletal: Strength & Muscle Tone: within normal limits Gait & Station: normal Patient leans: N/A  Psychiatric Specialty Exam: Physical Exam  Nursing note and vitals reviewed. Constitutional: He appears well-developed and well-nourished.  HENT:  Head: Normocephalic and atraumatic.  Eyes: Pupils are equal, round, and reactive to light. Conjunctivae are normal.  Neck: Normal range of motion.  Cardiovascular: Regular rhythm and normal heart sounds.  Respiratory: Effort normal. No respiratory distress.  GI: Soft.  Musculoskeletal: Normal range of motion.  Neurological: He is alert.  Skin: Skin is warm and dry.  Psychiatric: He has a normal mood and affect. His speech is normal and behavior is normal. Judgment and thought content normal. Cognition and  memory are normal.    Review of Systems  Constitutional: Negative.   HENT: Negative.   Eyes: Negative.   Respiratory: Negative.   Cardiovascular: Negative.   Gastrointestinal: Negative.   Musculoskeletal: Negative.   Skin: Negative.   Neurological: Negative.   Psychiatric/Behavioral: Negative.     Blood pressure 104/70, pulse 78, temperature 98.7 F (37.1 C), temperature source Oral, resp. rate 16, height 5\' 4"  (1.626 m), weight 106.1 kg, SpO2 100 %.Body mass index is 40.15  kg/m.  General Appearance: Fairly Groomed  Eye Contact:  Good  Speech:  Clear and Coherent  Volume:  Normal  Mood:  Euthymic  Affect:  Constricted  Thought Process:  Goal Directed  Orientation:  Full (Time, Place, and Person)  Thought Content:  Logical  Suicidal Thoughts:  No  Homicidal Thoughts:  No  Memory:  Immediate;   Fair Recent;   Fair Remote;   Fair  Judgement:  Fair  Insight:  Fair  Psychomotor Activity:  Normal  Concentration:  Concentration: Fair  Recall:  [employer]is[employer]Fair  Fund of Knowledge:  Fair  Language:  Fair  Akathisia:  No  Handed:  Right  AIMS (if indicated):     Assets:  Desire for Improvement Housing Physical Health Resilience Social Support  ADL's:  Intact  Cognition:  WNL  Sleep:  Number of Hours: 7.5     Treatment Plan Summary: Daily contact with patient to assess and evaluate symptoms and progress in treatment, Medication management and Plan Pleasant gentleman who schizophrenia seems to be under pretty good control.  I am going to check a lithium level Monday morning prior to his anticipated discharge.  I am probably going to go ahead and give him a second shot of Invega assessed and at that time also even though it may be a little bit early and should be tolerated okay I think in a gentleman like this who has been on antipsychotics for years.  Even though his sugars are slightly elevated and he does have diabetes I am going to allow him to have double portions.  He probably will eat whatever he wants outside of the hospital anyway.  No reason to significantly impair his quality of life right now.  Patient does get up frequently and singing but not at all in and of noxious or intrusive way.  Does not seem like something we need to worry about.  Mordecai RasmussenJohn Jazzalyn Loewenstein, MD 07/22/2018, 1:17 PM

## 2018-07-22 NOTE — Plan of Care (Signed)
Found patient in day room visibly socializing throughout the evening, mood and affect remain good, patient denies SI,HI,AVH, denies any depression or anxiety , denies any physical complains, takes his medications and  patient is aware of positive coping skills. Appetite is good, improved in thought process and compliant with therapeutic regimen, contract for safety, sleep is continuous only requiring 15 minutes rounding no distress.   Problem: Education: Goal: Utilization of techniques to improve thought processes will improve Outcome: Progressing Goal: Knowledge of the prescribed therapeutic regimen will improve Outcome: Progressing   Problem: Activity: Goal: Interest or engagement in leisure activities will improve Outcome: Progressing Goal: Imbalance in normal sleep/wake cycle will improve Outcome: Progressing   Problem: Coping: Goal: Coping ability will improve Outcome: Progressing Goal: Will verbalize feelings Outcome: Progressing   Problem: Health Behavior/Discharge Planning: Goal: Ability to make decisions will improve Outcome: Progressing Goal: Compliance with therapeutic regimen will improve Outcome: Progressing   Problem: Education: Goal: Ability to state activities that reduce stress will improve Outcome: Progressing   Problem: Coping: Goal: Ability to identify and develop effective coping behavior will improve Outcome: Progressing   Problem: Self-Concept: Goal: Ability to identify factors that promote anxiety will improve Outcome: Progressing Goal: Level of anxiety will decrease Outcome: Progressing Goal: Ability to modify response to factors that promote anxiety will improve Outcome: Progressing   Problem: Education: Goal: Ability to describe self-care measures that may prevent or decrease complications (Diabetes Survival Skills Education) will improve Outcome: Progressing Goal: Individualized Educational Video(s) Outcome: Progressing   Problem:  Coping: Goal: Ability to adjust to condition or change in health will improve Outcome: Progressing   Problem: Health Behavior/Discharge Planning: Goal: Ability to identify and utilize available resources and services will improve Outcome: Progressing Goal: Ability to manage health-related needs will improve Outcome: Progressing   Problem: Activity: Goal: Will identify at least one activity in which they can participate Outcome: Progressing   Problem: Coping: Goal: Ability to identify and develop effective coping behavior will improve Outcome: Progressing Goal: Ability to interact with others will improve Outcome: Progressing Goal: Demonstration of participation in decision-making regarding own care will improve Outcome: Progressing Goal: Ability to use eye contact when communicating with others will improve Outcome: Progressing   Problem: Self-Concept: Goal: Will verbalize positive feelings about self Outcome: Progressing

## 2018-07-22 NOTE — [facility] Group Notes (Signed)
LCSW Group Therapy Note  07/22/2018 1:15pm  Type of Therapy and Topic:  Group Therapy:  Cognitive Distortions  Participation Level:  Did Not Attend   Description of Group:    Patients in this group will be introduced to the topic of cognitive distortions.  Patients will identify and describe cognitive distortions, describe the feelings these distortions create for them.  Patients will identify one or more situations in their personal life where they have cognitively distorted thinking and will verbalize challenging this cognitive distortion through positive thinking skills.  Patients will practice the skill of using positive affirmations to challenge cognitive distortions using affirmation cards.    Therapeutic Goals:  1. Patient will identify two or more cognitive distortions they have used 2. Patient will identify one or more emotions that stem from use of a cognitive distortion 3. Patient will demonstrate use of a positive affirmation to counter a cognitive distortion through discussion and/or role play. 4. Patient will describe one way cognitive distortions can be detrimental to wellness   Summary of Patient Progress: Pt was invited to attend group but chose not to attend. CSW will continue to encourage pt to attend group throughout their admission.      Therapeutic Modalities:   Cognitive Behavioral Therapy Motivational Interviewing   Darrel Baroni  CUEBAS-COLON, LCSW 07/22/2018 3:07 PM

## 2018-07-23 LAB — GLUCOSE, CAPILLARY
Glucose-Capillary: 80 mg/dL (ref 70–99)
Glucose-Capillary: 158 mg/dL — ABNORMAL HIGH (ref 70–99)
Glucose-Capillary: 189 mg/dL — ABNORMAL HIGH (ref 70–99)
Glucose-Capillary: 136 mg/dL — ABNORMAL HIGH (ref 70–99)

## 2018-07-23 MED ORDER — LISINOPRIL 40 MG PO TABS: 40.0000 mg | ORAL_TABLET | Freq: Every day | ORAL | 1 refills | Status: DC

## 2018-07-23 MED ORDER — PALIPERIDONE PALMITATE ER 156 MG/ML IM SUSY: 156.0000 mg | PREFILLED_SYRINGE | INTRAMUSCULAR | 1 refills | Status: DC

## 2018-07-23 MED ORDER — LORATADINE 10 MG PO TABS: 10.0000 mg | ORAL_TABLET | Freq: Every day | ORAL | 1 refills | Status: DC

## 2018-07-23 MED ORDER — FLUTICASONE PROPIONATE 50 MCG/ACT NA SUSP: 2.0000 | Freq: Every day | NASAL | 2 refills | Status: DC

## 2018-07-23 MED ORDER — AMLODIPINE BESYLATE 10 MG PO TABS: 10.0000 mg | ORAL_TABLET | Freq: Every day | ORAL | 1 refills | Status: DC

## 2018-07-23 MED ORDER — ASPIRIN EC 81 MG PO TBEC: 81.0000 mg | DELAYED_RELEASE_TABLET | Freq: Every day | ORAL | 1 refills | Status: DC

## 2018-07-23 MED ORDER — POLYETHYLENE GLYCOL 3350 17 G PO PACK: 17.0000 g | PACK | Freq: Every day | ORAL | 0 refills | Status: DC

## 2018-07-23 MED ORDER — GLIPIZIDE ER 10 MG PO TB24: 10.0000 mg | ORAL_TABLET | Freq: Every day | ORAL | 1 refills | Status: DC

## 2018-07-23 MED ORDER — ZIPRASIDONE HCL 40 MG PO CAPS: 40.0000 mg | ORAL_CAPSULE | Freq: Two times a day (BID) | ORAL | 0 refills | Status: DC

## 2018-07-23 MED ORDER — INSULIN GLARGINE 100 UNIT/ML ~~LOC~~ SOLN: 10.0000 [IU] | Freq: Every day | SUBCUTANEOUS | 11 refills | Status: DC

## 2018-07-23 MED ORDER — CLONIDINE HCL 0.2 MG PO TABS: 0.2000 mg | ORAL_TABLET | Freq: Two times a day (BID) | ORAL | 1 refills | Status: DC

## 2018-07-23 MED ORDER — PALIPERIDONE PALMITATE ER 156 MG/ML IM SUSY
156.0000 mg | PREFILLED_SYRINGE | Freq: Once | INTRAMUSCULAR | Status: AC
  Administered 2018-07-24: 156 mg via INTRAMUSCULAR
  Filled 2018-07-23 (×2): qty 1

## 2018-07-23 MED ORDER — LITHIUM CARBONATE ER 450 MG PO TBCR: 900.0000 mg | EXTENDED_RELEASE_TABLET | Freq: Every day | ORAL | 1 refills | Status: DC

## 2018-07-23 MED ORDER — METFORMIN HCL 1000 MG PO TABS: 1000.0000 mg | ORAL_TABLET | Freq: Two times a day (BID) | ORAL | 1 refills | Status: DC

## 2018-07-23 NOTE — [facility] Group Notes (Signed)
LCSW Group Therapy Note 07/23/2018 1:15pm  Type of Therapy and Topic: Group Therapy: Feelings Around Returning Home & Establishing a Supportive Framework and Supporting Oneself When Supports Not Available  Participation Level: Active  Description of Group:  Patients first processed thoughts and feelings about upcoming discharge. These included fears of upcoming changes, lack of change, new living environments, judgements and expectations from others and overall stigma of mental health issues. The group then discussed the definition of a supportive framework, what that looks and feels like, and how do to discern it from an unhealthy non-supportive network. The group identified different types of supports as well as what to do when your family/friends are less than helpful or unavailable  Therapeutic Goals  1. Patient will identify one healthy supportive network that they can use at discharge. 2. Patient will identify one factor of a supportive framework and how to tell it from an unhealthy network. 3. Patient able to identify one coping skill to use when they do not have positive supports from others. 4. Patient will demonstrate ability to communicate their needs through discussion and/or role plays.  Summary of Patient Progress:  The patient reported he feels "good." Pt engaged during group session. As patients processed their anxiety about discharge and described healthy supports patient shared he is ready to be discharge. He stated, "I feel like I've done everything to feel better." He listed his family and friends as his main support. Patients identified at least one self-care tool they were willing to use after discharge: writing music and walking.   Therapeutic Modalities Cognitive Behavioral Therapy Motivational Interviewing   Shawon Denzer  CUEBAS-COLON, LCSW 07/23/2018 11:51 AM

## 2018-07-23 NOTE — [facility] Suicide Risk Assessment (Signed)
[facility] Discharge Suicide Risk Assessment   Principal Problem: Schizophrenia [facility]) Discharge Diagnoses: Principal Problem:   Schizophrenia (HCC) Active Problems:   Hypertension   Type 2 diabetes mellitus (HCC)   Total Time spent with patient: 45 minutes  Musculoskeletal: Strength & Muscle Tone: within normal limits Gait & Station: normal Patient leans: N/A  Psychiatric Specialty Exam: Review of Systems  Constitutional: Negative.   HENT: Negative.   Eyes: Negative.   Respiratory: Negative.   Cardiovascular: Negative.   Gastrointestinal: Negative.   Musculoskeletal: Negative.   Skin: Negative.   Neurological: Negative.   Psychiatric/Behavioral: Negative.     Blood pressure 114/70, pulse 95, temperature 98.4 F (36.9 C), temperature source Oral, resp. rate 18, height 5\' 4"  (1.626 m), weight 106.1 kg, SpO2 99 %.Body mass index is 40.15 kg/m.  General Appearance: Casual  Eye Contact::  Good  Speech:  Normal Rate409  Volume:  Normal  Mood:  Euthymic  Affect:  Congruent  Thought Process:  Goal Directed  Orientation:  Full (Time, Place, and Person)  Thought Content:  Logical  Suicidal Thoughts:  No  Homicidal Thoughts:  No  Memory:  Immediate;   Fair Recent;   Fair Remote;   Fair  Judgement:  Fair  Insight:  Fair  Psychomotor Activity:  Decreased  Concentration:  Fair  Recall:  [employer] of Knowledge:Fair  Language: Fair  Akathisia:  No  Handed:  Right  AIMS (if indicated):     Assets:  Desire for Improvement Housing Resilience Social Support  Sleep:  Number of Hours: 4.25  Cognition: WNL  ADL's:  Intact   Mental Status Per Nursing Assessment::   On Admission:  NA  Demographic Factors:  Male and Living alone  Loss Factors: Financial problems/change in socioeconomic status  Historical Factors: Impulsivity  Risk Reduction Factors:   Religious beliefs about death, Positive social support and Positive therapeutic relationship  Continued Clinical  Symptoms:  Schizophrenia:   Paranoid or undifferentiated type  Cognitive Features That Contribute To Risk:  Closed-mindedness    Suicide Risk:  Minimal: No identifiable suicidal ideation.  Patients presenting with no risk factors but with morbid ruminations; may be classified as minimal risk based on the severity of the depressive symptoms  Follow-up Information    [facility] Follow up.   Why:  Continue to follow up with [facility] services as usual. Contact information: [address] [place], [place] [address] Phone: [phone] Fax: [phone]       [facility] Follow up on 07/27/2018.   Specialty:  Internal Medicine Why:  You have a telemedicine appointment with Dr. Fleet Contras on 07/27/2018 at 4:30 PM. He will call you on the phone so make sure to answer. Thank you! Contact information: [address] [place] [address] [phone]           Plan Of Care/Follow-up recommendations:  Activity:  Activity as tolerated Diet:  Diabetic diet Other:  Follow-up with outpatient mental health provider  Mordecai Rasmussen, MD 07/23/2018, 1:54 PM

## 2018-07-23 NOTE — Progress Notes (Signed)
[facility] MD Progress Note  07/23/2018 1:52 PM Mitchell [name]  MRN:  [id number] Subjective: Patient seen and chart reviewed.  Patient with schizophrenia has no new complaints.  Denies hallucinations.  Denies any delusions or disorganized thinking.  Not feeling depressed.  No suicidal or homicidal ideation.  No sign of any physical problems.  Tolerating medicine well.  Sugars stable. Principal Problem: Schizophrenia (HCC) Diagnosis: Principal Problem:   Schizophrenia (HCC) Active Problems:   Hypertension   Type 2 diabetes mellitus (HCC)  Total Time spent with patient: 30 minutes  Past Psychiatric History: Patient has a history of schizophrenia and had recently been transitioning into independent living  Past Medical History:  Past Medical History:  Diagnosis Date  . Bipolar affective disorder (HCC)    takes Zyprexa daily  . Diabetes mellitus    takes Victoza,Metformin,and Glipizide daily  . Hypertension    takes Amlodipine,Lisinopril and Clonidine daily  . Hyponatremia    history of  . Mental disorder    takes Lithium daily  . Schizoaffective disorder    takes Trazodone nightly  . Seasonal allergies    takes Claritin daily  . Sleep apnea    sleep study >15yrs ago  . Stroke [facility])    left arm weakness    Past Surgical History:  Procedure Laterality Date  . CATARACT EXTRACTION W/PHACO Right 02/14/2013   Procedure: CATARACT EXTRACTION PHACO AND INTRAOCULAR LENS PLACEMENT (IOC);  Surgeon: Shade Flood, MD;  Location: [facility] OR;  Service: Ophthalmology;  Laterality: Right;  . CATARACT EXTRACTION W/PHACO Left 06/13/2013   Procedure: CATARACT EXTRACTION PHACO AND INTRAOCULAR LENS PLACEMENT (IOC);  Surgeon: Shade Flood, MD;  Location: [facility] OR;  Service: Ophthalmology;  Laterality: Left;  . CIRCUMCISION  20 yrs. ago  . EYE SURGERY     Family History: History reviewed. No pertinent family history. Family Psychiatric  History: See previous Social History:  Social History   Substance and  Sexual Activity  Alcohol Use No     Social History   Substance and Sexual Activity  Drug Use No    Social History   Socioeconomic History  . Marital status: Divorced    Spouse name: Not on file  . Number of children: Not on file  . Years of education: Not on file  . Highest education level: Not on file  Occupational History  . Not on file  Social Needs  . Financial resource strain: Not on file  . Food insecurity:    Worry: Not on file    Inability: Not on file  . Transportation needs:    Medical: Not on file    Non-medical: Not on file  Tobacco Use  . Smoking status: Never Smoker  . Smokeless tobacco: Never Used  Substance and Sexual Activity  . Alcohol use: No  . Drug use: No  . Sexual activity: Yes    Birth control/protection: None  Lifestyle  . Physical activity:    Days per week: Not on file    Minutes per session: Not on file  . Stress: Not on file  Relationships  . Social connections:    Talks on phone: Not on file    Gets together: Not on file    Attends religious service: Not on file    Active member of club or organization: Not on file    Attends meetings of clubs or organizations: Not on file    Relationship status: Not on file  Other Topics Concern  . Not on file  Social  History Narrative  . Not on file   Additional Social History:                         Sleep: Fair  Appetite:  Fair  Current Medications: Current Facility-Administered Medications  Medication Dose Route Frequency Provider Last Rate Last Dose  . acetaminophen (TYLENOL) tablet 650 mg  650 mg Oral Q6H PRN Mariel Craft, MD      . alum & mag hydroxide-simeth (MAALOX/MYLANTA) 200-200-20 MG/5ML suspension 30 mL  30 mL Oral Q4H PRN Mariel Craft, MD      . amLODipine (NORVASC) tablet 10 mg  10 mg Oral Daily Mariel Craft, MD   10 mg at 07/23/18 4540  . aspirin EC tablet 81 mg  81 mg Oral Daily Mariel Craft, MD   81 mg at 07/23/18 9811  . cloNIDine (CATAPRES)  tablet 0.2 mg  0.2 mg Oral BID Mariel Craft, MD   0.2 mg at 07/23/18 0813  . fluticasone (FLONASE) 50 MCG/ACT nasal spray 2 spray  2 spray Each Nare Daily Mariel Craft, MD   2 spray at 07/23/18 0813  . glipiZIDE (GLUCOTROL XL) 24 hr tablet 10 mg  10 mg Oral Daily Mariel Craft, MD   10 mg at 07/23/18 0813  . hydrOXYzine (ATARAX/VISTARIL) tablet 25 mg  25 mg Oral TID PRN Mariel Craft, MD   25 mg at 07/21/18 0135  . insulin aspart (novoLOG) injection 0-15 Units  0-15 Units Subcutaneous TID WC Clapacs, Jackquline Denmark, MD   3 Units at 07/23/18 1208  . insulin glargine (LANTUS) injection 10 Units  10 Units Subcutaneous QHS Clapacs, Jackquline Denmark, MD   10 Units at 07/22/18 2150  . lisinopril (ZESTRIL) tablet 40 mg  40 mg Oral Daily Mariel Craft, MD   40 mg at 07/23/18 9147  . lithium carbonate (ESKALITH) CR tablet 900 mg  900 mg Oral QHS Clapacs, Jackquline Denmark, MD   900 mg at 07/22/18 2144  . loratadine (CLARITIN) tablet 10 mg  10 mg Oral Daily Mariel Craft, MD   10 mg at 07/23/18 8295  . ziprasidone (GEODON) injection 20 mg  20 mg Intramuscular Q12H PRN Mariel Craft, MD       And  . LORazepam (ATIVAN) tablet 1 mg  1 mg Oral PRN Mariel Craft, MD      . magnesium hydroxide (MILK OF MAGNESIA) suspension 30 mL  30 mL Oral Daily PRN Mariel Craft, MD      . metFORMIN (GLUCOPHAGE) tablet 1,000 mg  1,000 mg Oral BID WC Mariel Craft, MD   1,000 mg at 07/23/18 6213  . polyethylene glycol (MIRALAX / GLYCOLAX) packet 17 g  17 g Oral Daily Mariel Craft, MD   17 g at 07/23/18 0813  . ziprasidone (GEODON) capsule 40 mg  40 mg Oral BID WC Mariel Craft, MD   40 mg at 07/23/18 0865    Lab Results:  Results for orders placed or performed during the hospital encounter of 07/18/18 (from the past 48 hour(s))  Glucose, capillary     Status: None   Collection Time: 07/21/18  4:37 PM  Result Value Ref Range   Glucose-Capillary 87 70 - 99 mg/dL  Glucose, capillary     Status: Abnormal   Collection  Time: 07/21/18  9:29 PM  Result Value Ref Range   Glucose-Capillary 125 (H) 70 - 99 mg/dL  Glucose, capillary     Status: None   Collection Time: 07/22/18  7:21 AM  Result Value Ref Range   Glucose-Capillary 98 70 - 99 mg/dL  Glucose, capillary     Status: Abnormal   Collection Time: 07/22/18 11:20 AM  Result Value Ref Range   Glucose-Capillary 165 (H) 70 - 99 mg/dL  Glucose, capillary     Status: Abnormal   Collection Time: 07/22/18  4:26 PM  Result Value Ref Range   Glucose-Capillary 110 (H) 70 - 99 mg/dL  Glucose, capillary     Status: Abnormal   Collection Time: 07/22/18  9:04 PM  Result Value Ref Range   Glucose-Capillary 206 (H) 70 - 99 mg/dL   Comment 1 Notify RN   Glucose, capillary     Status: None   Collection Time: 07/23/18  7:07 AM  Result Value Ref Range   Glucose-Capillary 80 70 - 99 mg/dL   Comment 1 Notify RN   Glucose, capillary     Status: Abnormal   Collection Time: 07/23/18 11:27 AM  Result Value Ref Range   Glucose-Capillary 158 (H) 70 - 99 mg/dL    Blood Alcohol level:  Lab Results  Component Value Date   ETH <10 07/18/2018   ETH <10 12/31/2017    Metabolic Disorder Labs: Lab Results  Component Value Date   HGBA1C 12.6 (H) 07/18/2018   MPG 314.92 07/18/2018   MPG 128 10/02/2014   No results found for: PROLACTIN Lab Results  Component Value Date   CHOL 151 07/18/2018   TRIG 122 07/18/2018   HDL 44 07/18/2018   CHOLHDL 3.4 07/18/2018   VLDL 24 07/18/2018   LDLCALC 83 07/18/2018   LDLCALC 70 01/27/2011    Physical Findings: AIMS: Facial and Oral Movements Muscles of Facial Expression: None, normal Lips and Perioral Area: None, normal Jaw: None, normal Tongue: None, normal,Extremity Movements Upper (arms, wrists, hands, fingers): None, normal Lower (legs, knees, ankles, toes): None, normal, Trunk Movements Neck, shoulders, hips: None, normal, Overall Severity Severity of abnormal movements (highest score from questions above): None,  normal Incapacitation due to abnormal movements: None, normal Patient's awareness of abnormal movements (rate only patient's report): No Awareness, Dental Status Current problems with teeth and/or dentures?: No Does patient usually wear dentures?: No  CIWA:  CIWA-Ar Total: 2 COWS:  COWS Total Score: 3  Musculoskeletal: Strength & Muscle Tone: within normal limits Gait & Station: normal Patient leans: N/A  Psychiatric Specialty Exam: Physical Exam  Nursing note and vitals reviewed. Constitutional: He appears well-developed and well-nourished.  HENT:  Head: Normocephalic and atraumatic.  Eyes: Pupils are equal, round, and reactive to light. Conjunctivae are normal.  Neck: Normal range of motion.  Cardiovascular: Regular rhythm and normal heart sounds.  Respiratory: Effort normal. No respiratory distress.  GI: Soft.  Musculoskeletal: Normal range of motion.  Neurological: He is alert.  Skin: Skin is warm and dry.  Psychiatric: He has a normal mood and affect. His behavior is normal. Judgment and thought content normal.    Review of Systems  Constitutional: Negative.   HENT: Negative.   Eyes: Negative.   Respiratory: Negative.   Cardiovascular: Negative.   Gastrointestinal: Negative.   Musculoskeletal: Negative.   Skin: Negative.   Neurological: Negative.   Psychiatric/Behavioral: Negative.     Blood pressure 114/70, pulse 95, temperature 98.4 F (36.9 C), temperature source Oral, resp. rate 18, height 5\' 4"  (1.626 m), weight 106.1 kg, SpO2 99 %.Body mass index is 40.15 kg/m.  General  Appearance: Casual  Eye Contact:  Good  Speech:  Clear and Coherent  Volume:  Normal  Mood:  Euthymic  Affect:  Constricted  Thought Process:  Goal Directed  Orientation:  Full (Time, Place, and Person)  Thought Content:  Logical  Suicidal Thoughts:  No  Homicidal Thoughts:  No  Memory:  Immediate;   Fair Recent;   Fair Remote;   Fair  Judgement:  Fair  Insight:  Fair  Psychomotor  Activity:  Decreased  Concentration:  Concentration: Fair  Recall:  [employer] of Knowledge:  Fair  Language:  Fair  Akathisia:  No  Handed:  Right  AIMS (if indicated):     Assets:  Desire for Improvement Housing Physical Health  ADL's:  Intact  Cognition:  WNL  Sleep:  Number of Hours: 4.25     Treatment Plan Summary: Daily contact with patient to assess and evaluate symptoms and progress in treatment, Medication management and Plan Patient stable doing well likely to be discharged tomorrow afternoon.  Plan is to administer his second Invega shot yesterday at which point we can discontinue the oral Invega and handed him back to his outpatient treatment team.  No other change to medicine for today.  Patient understands and is agreeable.  Mordecai Rasmussen, MD 07/23/2018, 1:52 PM

## 2018-07-23 NOTE — Plan of Care (Signed)
Patient present in the milieu with a steady gait. Compliant with with medication and meals. BS monitored and coverage provided when needed.  Reports that he slept well last night with the use of a sleep aid. Appetite is good with normal energy level. Concentration is good and denies, depression, anxiety and hopelessness. Denies having any thoughts of SI but states, "No not right now I feel pretty good." Present in the dayroom for meals, patient does not have any goals for today. Milieu remains safe with q 15 minute safety checks.

## 2018-07-24 LAB — GLUCOSE, CAPILLARY
Glucose-Capillary: 132 mg/dL — ABNORMAL HIGH (ref 70–99)
Glucose-Capillary: 77 mg/dL (ref 70–99)

## 2018-07-24 LAB — LITHIUM LEVEL: Lithium Lvl: 0.97 mmol/L (ref 0.60–1.20)

## 2018-07-24 NOTE — Progress Notes (Signed)
  [facility] Adult Case Management Discharge Plan :  Will you be returning to the same living situation after discharge:  Yes,  home At discharge, do you have transportation home?: Yes,  PSR team Do you have the ability to pay for your medications: Yes,  [facility] Medicare  Release of information consent forms completed and in the chart;  Patient to Follow up at: Follow-up Information    [facility] Follow up.   Why:  Continue to follow up with [facility] services as usual. Contact information: [address] [place], [place] [address] Phone: [phone] Fax: [phone]       [facility] Follow up on 07/27/2018.   Specialty:  Internal Medicine Why:  You have a telemedicine appointment with Dr. Fleet Contras on 07/27/2018 at 4:30 PM. He will call you on the phone so make sure to answer. Thank you! Contact information: [address] [place] [address] [phone]           Next level of care provider has access to [facility] Link:no  Safety Planning and Suicide Prevention discussed: Yes,  SPE completed with Pts friend Dolly Rias  Have you used any form of tobacco in the last 30 days? (Cigarettes, Smokeless Tobacco, Cigars, and/or Pipes): No  Has patient been referred to the Quitline?: N/A patient is not a smoker  Patient has been referred for addiction treatment: N/A  Mechele Dawley, LCSW 07/24/2018, 9:07 AM

## 2018-07-24 NOTE — Progress Notes (Signed)
Recreation Therapy Notes   Date: 07/24/2018  Time: 9:30 am  Location: Craft Room  Behavioral response: Appropriate  Intervention Topic: Anger Management  Discussion/Intervention:  Group content on today was focused on anger management. The group defined anger and reasons they become angry. Individuals expressed negative way they have dealt with anger in the past. Patients stated some positive ways they could deal with anger in the future. The group described how anger can affect your health and daily plans. Individuals participated in the intervention "Score your anger" where they had a chance to answer questions about themselves and get a score of their anger.  Clinical Observations/Feedback:  Patient came to group defined anger management as getting along. He stated he can not stay angry for long; he normally calms himself down. Individual was social with peers and staff while participating in the intervention. Lashea Goda LRT/CTRS         Clista Rainford 07/24/2018 10:56 AM

## 2018-07-24 NOTE — Tx Team (Signed)
Interdisciplinary Treatment and Diagnostic Plan Update  07/24/2018 Time of Session: 830AM Mitchell [name] MRN: [id number]  Principal Diagnosis: Schizophrenia [facility](HCC)  Secondary Diagnoses: Principal Problem:   Schizophrenia (HCC) Active Problems:   Hypertension   Type 2 diabetes mellitus (HCC)   Current Medications:  Current Facility-Administered Medications  Medication Dose Route Frequency Provider Last Rate Last Dose  . acetaminophen (TYLENOL) tablet 650 mg  650 mg Oral Q6H PRN Mariel CraftMaurer, Sheila M, MD      . alum & mag hydroxide-simeth (MAALOX/MYLANTA) 200-200-20 MG/5ML suspension 30 mL  30 mL Oral Q4H PRN Mariel CraftMaurer, Sheila M, MD      . amLODipine (NORVASC) tablet 10 mg  10 mg Oral Daily Mariel CraftMaurer, Sheila M, MD   10 mg at 07/24/18 [id number]  . aspirin EC tablet 81 mg  81 mg Oral Daily Mariel CraftMaurer, Sheila M, MD   81 mg at 07/24/18 [id number]  . cloNIDine (CATAPRES) tablet 0.2 mg  0.2 mg Oral BID Mariel CraftMaurer, Sheila M, MD   0.2 mg at 07/24/18 [id number]  . fluticasone (FLONASE) 50 MCG/ACT nasal spray 2 spray  2 spray Each Nare Daily Mariel CraftMaurer, Sheila M, MD   2 spray at 07/24/18 0751  . glipiZIDE (GLUCOTROL XL) 24 hr tablet 10 mg  10 mg Oral Daily Mariel CraftMaurer, Sheila M, MD   10 mg at 07/24/18 [id number]  . hydrOXYzine (ATARAX/VISTARIL) tablet 25 mg  25 mg Oral TID PRN Mariel CraftMaurer, Sheila M, MD   25 mg at 07/21/18 0135  . insulin aspart (novoLOG) injection 0-15 Units  0-15 Units Subcutaneous TID WC Clapacs, Jackquline DenmarkJohn T, MD   2 Units at 07/24/18 0751  . insulin glargine (LANTUS) injection 10 Units  10 Units Subcutaneous QHS Clapacs, Jackquline DenmarkJohn T, MD   10 Units at 07/23/18 2100  . lisinopril (ZESTRIL) tablet 40 mg  40 mg Oral Daily Mariel CraftMaurer, Sheila M, MD   40 mg at 07/24/18 [id number]  . lithium carbonate (ESKALITH) CR tablet 900 mg  900 mg Oral QHS Clapacs, John T, MD   900 mg at 07/23/18 2100  . loratadine (CLARITIN) tablet 10 mg  10 mg Oral Daily Mariel CraftMaurer, Sheila M, MD   10 mg at 07/24/18 [id number]  . ziprasidone (GEODON) injection 20 mg  20 mg Intramuscular Q12H PRN  Mariel CraftMaurer, Sheila M, MD       And  . LORazepam (ATIVAN) tablet 1 mg  1 mg Oral PRN Mariel CraftMaurer, Sheila M, MD      . magnesium hydroxide (MILK OF MAGNESIA) suspension 30 mL  30 mL Oral Daily PRN Mariel CraftMaurer, Sheila M, MD      . metFORMIN (GLUCOPHAGE) tablet 1,000 mg  1,000 mg Oral BID WC Mariel CraftMaurer, Sheila M, MD   1,000 mg at 07/24/18 0752  . paliperidone (INVEGA SUSTENNA) injection 156 mg  156 mg Intramuscular Once Clapacs, John T, MD      . polyethylene glycol (MIRALAX / GLYCOLAX) packet 17 g  17 g Oral Daily Mariel CraftMaurer, Sheila M, MD   17 g at 07/24/18 0750  . ziprasidone (GEODON) capsule 40 mg  40 mg Oral BID WC Mariel CraftMaurer, Sheila M, MD   40 mg at 07/24/18 [id number]   PTA Medications: Medications Prior to Admission  Medication Sig Dispense Refill Last Dose  . insulin aspart (NOVOLOG) 100 UNIT/ML injection Inject 0-15 Units into the skin 3 (three) times daily with meals. (Patient taking differently: Inject 0-15 Units into the skin 3 (three) times daily as needed for high blood sugar. BS 151-200: Take 2 units, 201-250: Takes 4  units, 251-300: Takes 6 units, 301-350: Take 8 units, 351-400: Take 10 units above 400 Take 15 units then notify MD.) 10 mL 11 12/31/2017 at Unknown time  . Liraglutide (VICTOZA) 18 MG/3ML SOLN injection Inject 1.2 mg into the skin daily.    12/31/2017 at Unknown time  . lithium 300 MG tablet Take 1.5 tablets (450 mg total) by mouth every morning. (Patient taking differently: Take 300 mg by mouth every morning. Take 300 mg in the AM and 450 mg QHS)   Past Week at Unknown time  . lithium carbonate (ESKALITH) 450 MG CR tablet Take 450 mg by mouth at bedtime.     . traZODone (DESYREL) 100 MG tablet Take 100 mg by mouth daily.     . ziprasidone (GEODON) 40 MG capsule Take 1 capsule (40 mg total) by mouth 2 (two) times daily with a meal. 60 capsule 0   . [DISCONTINUED] amLODipine (NORVASC) 10 MG tablet Take 10 mg by mouth daily.   Past Week at Unknown time  . [DISCONTINUED] aspirin EC 81 MG tablet Take 81 mg by  mouth daily.   Past Week at Unknown time  . [DISCONTINUED] cloNIDine (CATAPRES) 0.2 MG tablet Take 0.2 mg by mouth 2 (two) times daily.   Past Week at Unknown time  . [DISCONTINUED] fluticasone (FLONASE) 50 MCG/ACT nasal spray Place 2 sprays into both nostrils daily. 16 g 2 Past Week at Unknown time  . [DISCONTINUED] glipiZIDE (GLUCOTROL XL) 10 MG 24 hr tablet Take 10 mg by mouth daily.   Past Week at Unknown time  . [DISCONTINUED] lisinopril (PRINIVIL,ZESTRIL) 40 MG tablet Take 40 mg by mouth daily.   Past Week at Unknown time  . [DISCONTINUED] loratadine (CLARITIN) 10 MG tablet Take 10 mg by mouth daily.   Past Week at Unknown time  . [DISCONTINUED] metFORMIN (GLUCOPHAGE) 1000 MG tablet Take 1,000 mg by mouth 2 (two) times daily with a meal.   12/30/2017 at Unknown time  . [DISCONTINUED] polyethylene glycol (MIRALAX / GLYCOLAX) packet Take 17 g by mouth daily.   Past Week at Unknown time    Patient Stressors: Financial difficulties Marital or family conflict Occupational concerns  Patient Strengths: Capable of independent living Motivation for treatment/growth Supportive family/friends  Treatment Modalities: Medication Management, Group therapy, Case management,  1 to 1 session with clinician, Psychoeducation, Recreational therapy.   Physician Treatment Plan for Primary Diagnosis: Schizophrenia (HCC) Long Term Goal(s): Improvement in symptoms so as ready for discharge Improvement in symptoms so as ready for discharge   Short Term Goals: Ability to verbalize feelings will improve Ability to demonstrate self-control will improve Ability to maintain clinical measurements within normal limits will improve  Medication Management: Evaluate patient's response, side effects, and tolerance of medication regimen.  Therapeutic Interventions: 1 to 1 sessions, Unit Group sessions and Medication administration.  Evaluation of Outcomes: Adequate for Discharge  Physician Treatment Plan for  Secondary Diagnosis: Principal Problem:   Schizophrenia (HCC) Active Problems:   Hypertension   Type 2 diabetes mellitus (HCC)  Long Term Goal(s): Improvement in symptoms so as ready for discharge Improvement in symptoms so as ready for discharge   Short Term Goals: Ability to verbalize feelings will improve Ability to demonstrate self-control will improve Ability to maintain clinical measurements within normal limits will improve     Medication Management: Evaluate patient's response, side effects, and tolerance of medication regimen.  Therapeutic Interventions: 1 to 1 sessions, Unit Group sessions and Medication administration.  Evaluation of Outcomes: Adequate for  Discharge   RN Treatment Plan for Primary Diagnosis: Schizophrenia (HCC) Long Term Goal(s): Knowledge of disease and therapeutic regimen to maintain health will improve  Short Term Goals: Ability to demonstrate self-control, Ability to participate in decision making will improve, Ability to identify and develop effective coping behaviors will improve and Compliance with prescribed medications will improve  Medication Management: RN will administer medications as ordered by provider, will assess and evaluate patient's response and provide education to patient for prescribed medication. RN will report any adverse and/or side effects to prescribing provider.  Therapeutic Interventions: 1 on 1 counseling sessions, Psychoeducation, Medication administration, Evaluate responses to treatment, Monitor vital signs and CBGs as ordered, Perform/monitor CIWA, COWS, AIMS and Fall Risk screenings as ordered, Perform wound care treatments as ordered.  Evaluation of Outcomes: Adequate for Discharge   LCSW Treatment Plan for Primary Diagnosis: Schizophrenia [facility]) Long Term Goal(s): Safe transition to appropriate next level of care at discharge, Engage patient in therapeutic group addressing interpersonal concerns.  Short Term Goals:  Engage patient in aftercare planning with referrals and resources, Increase social support, Facilitate acceptance of mental health diagnosis and concerns and Increase skills for wellness and recovery  Therapeutic Interventions: Assess for all discharge needs, 1 to 1 time with Social worker, Explore available resources and support systems, Assess for adequacy in community support network, Educate family and significant other(s) on suicide prevention, Complete Psychosocial Assessment, Interpersonal group therapy.  Evaluation of Outcomes: Adequate for Discharge   Progress in Treatment: Attending groups: Yes. Participating in groups: Yes. Taking medication as prescribed: Yes. Toleration medication: Yes. Family/Significant other contact made: Yes, individual(s) contacted:  Dolly Rias, pts friend Patient understands diagnosis: Yes. Discussing patient identified problems/goals with staff: Yes. Medical problems stabilized or resolved: Yes. Denies suicidal/homicidal ideation: Yes. Issues/concerns per patient self-inventory: No. Other: N/A  New problem(s) identified: No, Describe:  none  New Short Term/Long Term Goal(s): medication management for mood stabilization;  development of comprehensive mental wellness/sobriety plan.   Patient Goals:  "Think about myself and make sure I'm better"  Discharge Plan or Barriers: SPE pamphlet, Mobile Crisis information, and AA/NA information provided to patient for additional community support and resources. Pt will follow up with his PSR services at [facility] and has an appointment scheduled with his PCP Dr. Fleet Contras on 07/27/2018.   Reason for Continuation of Hospitalization: None  Estimated Length of Stay: Today 07/24/2018  Recreational Therapy: Patient Stressors: Friends Patient Goal: Patient will identify benefit of making healthy decisions post d/c within 5 recreation therapy group sessions  Attendees: Patient: Mitchell [name]  07/24/2018 9:23 AM  Physician:  07/24/2018 9:23 AM  Nursing:  07/24/2018 9:23 AM  RN Care Manager: 07/24/2018 9:23 AM  Social Worker: Zollie Scale Fuquan Wilson LCSW 07/24/2018 9:23 AM  Recreational Therapist:  07/24/2018 9:23 AM  Other:  07/24/2018 9:23 AM  Other:  07/24/2018 9:23 AM  Other: 07/24/2018 9:23 AM    Scribe for Treatment Team: [name] Damion Kant, LCSW 07/24/2018 9:23 AM

## 2018-07-24 NOTE — Progress Notes (Signed)
Recreation Therapy Notes  INPATIENT RECREATION TR PLAN  Patient Details Name: Mitchell [name] MRN: [id number] DOB: May 20, 1960 Today's Date: 07/24/2018  Rec Therapy Plan Is patient appropriate for Therapeutic Recreation?: Yes Treatment times per week: At least 3 Estimated Length of Stay: 5-7 days TR Treatment/Interventions: Group participation (Comment)  Discharge Criteria Pt will be discharged from therapy if:: Discharged Treatment plan/goals/alternatives discussed and agreed upon by:: Patient/family  Discharge Summary Short term goals set: Patient will identify benefit of making healthy decisions post d/c within 5 recreation therapy group sessions Short term goals met: Complete Progress toward goals comments: Groups attended Which groups?: Anger management, Wellness, Other (Comment)(Happiness, Team work ) Reason goals not met: N/A Therapeutic equipment acquired: N/A Reason patient discharged from therapy: Discharge from hospital Pt/family agrees with progress & goals achieved: Yes Date patient discharged from therapy: 07/24/18   Grantham Hippert 07/24/2018, 11:07 AM

## 2018-07-24 NOTE — Progress Notes (Signed)
D: Pt during assessments denies SI/HI/AVH, able to contract for safety. Pt is pleasant and cooperative, engages good. Pt. Has no complaints. Pt. Endorses a normal mood.   A: Q x 15 minute observation checks were completed for safety. Patient was provided with education. Patient was given/offered medications per orders. Patient  was encourage to attend groups, participate in unit activities and continue with plan of care. Pt. Chart and plans of care reviewed. Pt. Given support and encouragement.   R: Patient is complaint with medications and unit procedures. Pt. Blood sugars monitored per MD orders. Pt. covid screened. Pt. Eating good during snack time. Pt. Interactions are appropriate.            Precautionary checks every 15 minutes for safety maintained, room free of safety hazards, patient sustains no injury or falls during this shift. Will endorse care to next shift.

## 2018-07-24 NOTE — Plan of Care (Signed)
Pt. Is complaint with treatment and medications. Pt. Reports improved coping. Pt. Endorses a normal mood. Pt. Verbalizes feelings appropriately. Pt. Denies si/hi/avh, able to contract for safety.    Problem: Coping: Goal: Coping ability will improve Outcome: Progressing Goal: Will verbalize feelings Outcome: Progressing   Problem: Health Behavior/Discharge Planning: Goal: Compliance with therapeutic regimen will improve Outcome: Progressing

## 2018-07-24 NOTE — Progress Notes (Signed)
Patient alert and oriented x 4. Ambulates unit with steady gait. Verbally denies SI/HI/AVH and pain. Patient discharged on above date and time. Verbalized understanding the discharge information provided to patient upon discharge. Patient departed unit with discharge paperwork, prescriptions and personal belongings. Picked up by group home staff. No distress noted.
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
## 2018-08-15 ENCOUNTER — Other Ambulatory Visit: Payer: Self-pay | Admitting: Psychiatry

## 2018-08-28 ENCOUNTER — Other Ambulatory Visit: Payer: Self-pay | Admitting: Psychiatry

## 2018-09-24 ENCOUNTER — Other Ambulatory Visit: Payer: Self-pay

## 2018-09-24 ENCOUNTER — Emergency Department (HOSPITAL_COMMUNITY)
Admission: EM | Admit: 2018-09-24 | Discharge: 2018-09-25 | Disposition: A | Discharge status: Home / Self Care | Payer: Medicare Other | Attending: Emergency Medicine | Admitting: Emergency Medicine

## 2018-09-24 DIAGNOSIS — Z7982 Long term (current) use of aspirin: Secondary | ICD-10-CM | POA: Diagnosis not present

## 2018-09-24 DIAGNOSIS — Y999 Unspecified external cause status: Secondary | ICD-10-CM | POA: Diagnosis not present

## 2018-09-24 DIAGNOSIS — Z79899 Other long term (current) drug therapy: Secondary | ICD-10-CM | POA: Diagnosis not present

## 2018-09-24 DIAGNOSIS — Y929 Unspecified place or not applicable: Secondary | ICD-10-CM | POA: Diagnosis not present

## 2018-09-24 DIAGNOSIS — S0502XA Injury of conjunctiva and corneal abrasion without foreign body, left eye, initial encounter: Secondary | ICD-10-CM | POA: Diagnosis not present

## 2018-09-24 DIAGNOSIS — Z794 Long term (current) use of insulin: Secondary | ICD-10-CM | POA: Insufficient documentation

## 2018-09-24 DIAGNOSIS — E119 Type 2 diabetes mellitus without complications: Secondary | ICD-10-CM | POA: Insufficient documentation

## 2018-09-24 DIAGNOSIS — Y33XXXA Other specified events, undetermined intent, initial encounter: Secondary | ICD-10-CM | POA: Diagnosis not present

## 2018-09-24 DIAGNOSIS — S0501XA Injury of conjunctiva and corneal abrasion without foreign body, right eye, initial encounter: Secondary | ICD-10-CM

## 2018-09-24 DIAGNOSIS — Z8673 Personal history of transient ischemic attack (TIA), and cerebral infarction without residual deficits: Secondary | ICD-10-CM | POA: Diagnosis not present

## 2018-09-24 DIAGNOSIS — I10 Essential (primary) hypertension: Secondary | ICD-10-CM | POA: Insufficient documentation

## 2018-09-24 DIAGNOSIS — Y9301 Activity, walking, marching and hiking: Secondary | ICD-10-CM | POA: Diagnosis not present

## 2018-09-24 DIAGNOSIS — S0590XA Unspecified injury of unspecified eye and orbit, initial encounter: Secondary | ICD-10-CM | POA: Diagnosis present

## 2018-09-24 NOTE — ED Notes (Signed)
Bed: QA83 Expected date: 09/24/18 Expected time:  Means of arrival:  Comments: 25M eye pain
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
## 2018-09-25 DIAGNOSIS — S0501XA Injury of conjunctiva and corneal abrasion without foreign body, right eye, initial encounter: Secondary | ICD-10-CM | POA: Diagnosis not present

## 2018-09-25 MED ORDER — FLUORESCEIN SODIUM 1 MG OP STRP
1.0000 | ORAL_STRIP | Freq: Once | OPHTHALMIC | Status: AC
  Administered 2018-09-25: 1 via OPHTHALMIC
  Filled 2018-09-25: qty 1

## 2018-09-25 MED ORDER — TETRACAINE HCL 0.5 % OP SOLN
2.0000 [drp] | Freq: Once | OPHTHALMIC | Status: AC
  Administered 2018-09-25: 03:00:00 2 [drp] via OPHTHALMIC
  Filled 2018-09-25: qty 4

## 2018-09-25 MED ORDER — ERYTHROMYCIN 5 MG/GM OP OINT
TOPICAL_OINTMENT | Freq: Once | OPHTHALMIC | Status: AC
  Administered 2018-09-25: 03:00:00 via OPHTHALMIC
  Filled 2018-09-25: qty 3.5

## 2018-09-25 NOTE — ED Notes (Signed)
Reviewed discharge instructions with pt. Reviewed with pt where to go for his eye appointment. Writer of this note also assisted pt with finding correct bus route to get to appointment.   Pt verbalized understanding and has no questions at this time.
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
## 2018-09-25 NOTE — ED Notes (Signed)
Requested equipment at bedside

## 2018-09-25 NOTE — Discharge Instructions (Addendum)
Go to the office of the ophthalmologist (eye doctor) at 8 AM this morning, Monday, June 29.

## 2018-11-26 ENCOUNTER — Other Ambulatory Visit: Payer: Self-pay

## 2018-11-26 ENCOUNTER — Encounter (HOSPITAL_COMMUNITY): Payer: Self-pay

## 2018-11-26 DIAGNOSIS — I10 Essential (primary) hypertension: Secondary | ICD-10-CM | POA: Diagnosis not present

## 2018-11-26 DIAGNOSIS — F250 Schizoaffective disorder, bipolar type: Secondary | ICD-10-CM | POA: Insufficient documentation

## 2018-11-26 DIAGNOSIS — Z79899 Other long term (current) drug therapy: Secondary | ICD-10-CM | POA: Insufficient documentation

## 2018-11-26 DIAGNOSIS — Z794 Long term (current) use of insulin: Secondary | ICD-10-CM | POA: Diagnosis not present

## 2018-11-26 DIAGNOSIS — R451 Restlessness and agitation: Secondary | ICD-10-CM | POA: Insufficient documentation

## 2018-11-26 DIAGNOSIS — Z7982 Long term (current) use of aspirin: Secondary | ICD-10-CM | POA: Insufficient documentation

## 2018-11-26 DIAGNOSIS — E119 Type 2 diabetes mellitus without complications: Secondary | ICD-10-CM | POA: Insufficient documentation

## 2018-11-26 NOTE — ED Triage Notes (Signed)
Patient ambulatory with [facility] from [place]-patients wants to speak to someone about his living situation with a male and he doesn't want to live there anymore. [employer] called EMS to transport him to [facility]-patient has no medical complaints, no SI/no HI.

## 2018-11-27 ENCOUNTER — Emergency Department (HOSPITAL_COMMUNITY)
Admission: EM | Admit: 2018-11-27 | Discharge: 2018-11-27 | Disposition: A | Discharge status: Home / Self Care | Payer: Medicare Other | Attending: Emergency Medicine | Admitting: Emergency Medicine

## 2018-11-27 DIAGNOSIS — F250 Schizoaffective disorder, bipolar type: Secondary | ICD-10-CM | POA: Diagnosis present

## 2018-11-27 DIAGNOSIS — R451 Restlessness and agitation: Secondary | ICD-10-CM | POA: Diagnosis not present

## 2018-11-27 LAB — COMPREHENSIVE METABOLIC PANEL
ALT: 27 U/L (ref 0–44)
AST: 34 U/L (ref 15–41)
Albumin: 4.3 g/dL (ref 3.5–5.0)
Alkaline Phosphatase: 102 U/L (ref 38–126)
Anion gap: 10 (ref 5–15)
BUN: 16 mg/dL (ref 6–20)
CO2: 25 mmol/L (ref 22–32)
Calcium: 9.1 mg/dL (ref 8.9–10.3)
Chloride: 99 mmol/L (ref 98–111)
Creatinine, Ser: 1.25 mg/dL — ABNORMAL HIGH (ref 0.61–1.24)
GFR calc Af Amer: >60 mL/min (ref >60)
GFR calc non Af Amer: >60 mL/min (ref >60)
Glucose, Bld: 276 mg/dL — ABNORMAL HIGH (ref 70–99)
Potassium: 3.5 mmol/L (ref 3.5–5.1)
Sodium: 134 mmol/L — ABNORMAL LOW (ref 135–145)
Total Bilirubin: 1 mg/dL (ref 0.3–1.2)
Total Protein: 7.6 g/dL (ref 6.5–8.1)

## 2018-11-27 LAB — CBC
HCT: 40.4 % (ref 39.0–52.0)
Hemoglobin: 13.4 g/dL (ref 13.0–17.0)
MCH: 31.7 pg (ref 26.0–34.0)
MCHC: 33.2 g/dL (ref 30.0–36.0)
MCV: 95.5 fL (ref 80.0–100.0)
Platelets: 202 10*3/uL (ref 150–400)
RBC: 4.23 MIL/uL (ref 4.22–5.81)
RDW: 13.2 % (ref 11.5–15.5)
WBC: 5.2 10*3/uL (ref 4.0–10.5)
nRBC: 0 % (ref 0.0–0.2)

## 2018-11-27 LAB — RAPID URINE DRUG SCREEN, HOSP PERFORMED
Amphetamines: NOT DETECTED
Barbiturates: NOT DETECTED
Benzodiazepines: NOT DETECTED
Cocaine: NOT DETECTED
Opiates: NOT DETECTED
Tetrahydrocannabinol: NOT DETECTED

## 2018-11-27 LAB — ETHANOL: Alcohol, Ethyl (B): <10 mg/dL (ref <10)

## 2018-11-27 MED ORDER — OLANZAPINE 10 MG PO TBDP: 10.0000 mg | ORAL_TABLET | Freq: Three times a day (TID) | ORAL | Status: DC | PRN

## 2018-11-27 MED ORDER — LORAZEPAM 1 MG PO TABS: 1.0000 mg | ORAL_TABLET | ORAL | Status: DC | PRN

## 2018-11-27 MED ORDER — ZIPRASIDONE MESYLATE 20 MG IM SOLR: 20.0000 mg | INTRAMUSCULAR | Status: DC | PRN

## 2018-11-27 NOTE — ED Notes (Signed)
Tele psych machine at bedside 
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
## 2018-11-27 NOTE — Discharge Instructions (Signed)
For your behavioral health needs, you are advised to continue treatment with [facility], your [employer] Support Team:       [facility]      [address], [place]      [place], [place] [address]      [phone]
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
## 2018-11-27 NOTE — BHH Suicide Risk Assessment (Cosign Needed Addendum)
Suicide Risk Assessment  Discharge Assessment   [facility] Discharge Suicide Risk Assessment   Principal Problem: Schizoaffective disorder, bipolar type [facility]) Discharge Diagnoses: Principal Problem:   Schizoaffective disorder, bipolar type ([place])   Total Time spent with patient: 30 minutes  Musculoskeletal: Strength & Muscle Tone: within normal limits Gait & Station: normal Patient leans: N/A  Psychiatric Specialty Exam:   Blood pressure (!) 172/95, pulse (!) 101, temperature 98 F (36.7 C), temperature source Oral, resp. rate 16, SpO2 95 %.There is no height or weight on file to calculate BMI.  General Appearance: Casual  Eye Contact::  Good  Speech:  Clear and Coherent409  Volume:  Normal  Mood:  Appropriate  Affect:  Congruent  Thought Process:  Coherent and Descriptions of Associations: Intact  Orientation:  Full (Time, Place, and Person)  Thought Content:  Logical  Suicidal Thoughts:  No  Homicidal Thoughts:  No  Memory:  Immediate;   Good Recent;   Good Remote;   Good  Judgement:  Fair  Insight:  Fair  Psychomotor Activity:  Normal  Concentration:  Good  Recall:  Good  Fund of Knowledge:Fair  Language: Good  Akathisia:  No  Handed:  Right  AIMS (if indicated):     Assets:  Financial Resources/Insurance Housing Social Support Talents/Skills  Sleep:     Cognition: WNL  ADL's:  Intact   Mental Status Per Nursing Assessment::   On Admission:   Patient verbalizes "I just needed to get away for a while, I wanted to relax for a while and think." Patient denies any SI, HI and AVH. Patient alert and oriented x4 during evaluation, answers appropriately. Patient states " I live by myself, I just want to be stable." Patient denies any plan or intent to harm anyone, including himself.  Patient endorses support with community support team, sees them approx. One time per week. Patient plans to reach out to community support team upon discharge.   Demographic Factors:   NA  Loss Factors: NA  Historical Factors: NA  Risk Reduction Factors:   Living with another person, especially a relative and Positive social support  Continued Clinical Symptoms:    Cognitive Features That Contribute To Risk:  None    Suicide Risk:  Minimal: No identifiable suicidal ideation.  Patients presenting with no risk factors but with morbid ruminations; may be classified as minimal risk based on the severity of the depressive symptoms    Plan Of Care/Follow-up recommendations: Take all medications as prescribed. Please attend all follow-up appointments as scheduled. Report any side effects to your outpatient psychiatrist. Abstain from alcohol and illegal drugs while taking prescription medications. In the event of worsening symptoms call the crisis hotline, 911 or go to the nearest emergency department for evaluation and treatment.     Mitchell Kluver, FNP 11/27/2018, 1:47 PM

## 2018-11-27 NOTE — BH Assessment (Signed)
[facility] Assessment Progress Note  Per Buford Dresser, DO, this pt does not require psychiatric hospitalization at this time.  Pt is to be discharged from [facility] with recommendation to continue treatment with [facility] [employer], pt's [employer] provider.  This has been included in pt's discharge instructions.  Pt's nurse, Caryl Pina, has been notified.  Jalene Mullet, [place] Triage Specialist [phone]

## 2018-11-27 NOTE — ED Notes (Signed)
Spoke with Dr. Dollene Cleveland to inform him of Tom's note from his [facility] assessement for discharge.

## 2018-11-27 NOTE — ED Notes (Signed)
Pt d/c home per MD order. Discharge summary reviewed with pt, pt verbalizes understanding. Not endorsing SI/HI. Ambulatory off unit. Personal property returned.

## 2019-01-03 ENCOUNTER — Encounter (HOSPITAL_COMMUNITY): Payer: Self-pay | Admitting: Emergency Medicine

## 2019-01-03 ENCOUNTER — Emergency Department (HOSPITAL_COMMUNITY)
Admission: EM | Admit: 2019-01-03 | Discharge: 2019-01-04 | Disposition: A | Discharge status: Other Acute Inpatient Hospital | Payer: Medicare Other | Attending: Emergency Medicine | Admitting: Emergency Medicine

## 2019-01-03 ENCOUNTER — Other Ambulatory Visit: Payer: Self-pay

## 2019-01-03 DIAGNOSIS — E119 Type 2 diabetes mellitus without complications: Secondary | ICD-10-CM | POA: Insufficient documentation

## 2019-01-03 DIAGNOSIS — F259 Schizoaffective disorder, unspecified: Secondary | ICD-10-CM | POA: Diagnosis not present

## 2019-01-03 DIAGNOSIS — I10 Essential (primary) hypertension: Secondary | ICD-10-CM | POA: Diagnosis not present

## 2019-01-03 DIAGNOSIS — R4182 Altered mental status, unspecified: Secondary | ICD-10-CM | POA: Diagnosis present

## 2019-01-03 DIAGNOSIS — Z8673 Personal history of transient ischemic attack (TIA), and cerebral infarction without residual deficits: Secondary | ICD-10-CM | POA: Diagnosis not present

## 2019-01-03 DIAGNOSIS — F3160 Bipolar disorder, current episode mixed, unspecified: Secondary | ICD-10-CM | POA: Insufficient documentation

## 2019-01-03 DIAGNOSIS — Z046 Encounter for general psychiatric examination, requested by authority: Secondary | ICD-10-CM | POA: Insufficient documentation

## 2019-01-03 DIAGNOSIS — Z20828 Contact with and (suspected) exposure to other viral communicable diseases: Secondary | ICD-10-CM | POA: Diagnosis not present

## 2019-01-03 LAB — CBC WITH DIFFERENTIAL/PLATELET
Abs Immature Granulocytes: 0 10*3/uL (ref 0.00–0.07)
Basophils Absolute: 0 10*3/uL (ref 0.0–0.1)
Basophils Relative: 0 %
Eosinophils Absolute: 0.1 10*3/uL (ref 0.0–0.5)
Eosinophils Relative: 2 %
HCT: 38.8 % — ABNORMAL LOW (ref 39.0–52.0)
Hemoglobin: 13.1 g/dL (ref 13.0–17.0)
Immature Granulocytes: 0 %
Lymphocytes Relative: 33 %
Lymphs Abs: 1.5 10*3/uL (ref 0.7–4.0)
MCH: 32.2 pg (ref 26.0–34.0)
MCHC: 33.8 g/dL (ref 30.0–36.0)
MCV: 95.3 fL (ref 80.0–100.0)
Monocytes Absolute: 0.4 10*3/uL (ref 0.1–1.0)
Monocytes Relative: 8 %
Neutro Abs: 2.6 10*3/uL (ref 1.7–7.7)
Neutrophils Relative %: 57 %
Platelets: 215 10*3/uL (ref 150–400)
RBC: 4.07 MIL/uL — ABNORMAL LOW (ref 4.22–5.81)
RDW: 12.8 % (ref 11.5–15.5)
WBC: 4.6 10*3/uL (ref 4.0–10.5)
nRBC: 0 % (ref 0.0–0.2)

## 2019-01-03 LAB — COMPREHENSIVE METABOLIC PANEL
ALT: 24 U/L (ref 0–44)
AST: 22 U/L (ref 15–41)
Albumin: 4 g/dL (ref 3.5–5.0)
Alkaline Phosphatase: 96 U/L (ref 38–126)
Anion gap: 10 (ref 5–15)
BUN: 18 mg/dL (ref 6–20)
CO2: 24 mmol/L (ref 22–32)
Calcium: 8.9 mg/dL (ref 8.9–10.3)
Chloride: 102 mmol/L (ref 98–111)
Creatinine, Ser: 1.07 mg/dL (ref 0.61–1.24)
GFR calc Af Amer: >60 mL/min (ref >60)
GFR calc non Af Amer: >60 mL/min (ref >60)
Glucose, Bld: 188 mg/dL — ABNORMAL HIGH (ref 70–99)
Potassium: 3.5 mmol/L (ref 3.5–5.1)
Sodium: 136 mmol/L (ref 135–145)
Total Bilirubin: 0.7 mg/dL (ref 0.3–1.2)
Total Protein: 7.1 g/dL (ref 6.5–8.1)

## 2019-01-03 LAB — URINALYSIS, ROUTINE W REFLEX MICROSCOPIC
Bilirubin Urine: NEGATIVE
Glucose, UA: 50 mg/dL — AB
Hgb urine dipstick: NEGATIVE
Ketones, ur: 20 mg/dL — AB
Leukocytes,Ua: NEGATIVE
Nitrite: NEGATIVE
Protein, ur: NEGATIVE mg/dL
Specific Gravity, Urine: 1.011 (ref 1.005–1.030)
pH: 5 (ref 5.0–8.0)

## 2019-01-03 LAB — RAPID URINE DRUG SCREEN, HOSP PERFORMED
Amphetamines: NOT DETECTED
Barbiturates: NOT DETECTED
Benzodiazepines: NOT DETECTED
Cocaine: NOT DETECTED
Opiates: NOT DETECTED
Tetrahydrocannabinol: NOT DETECTED

## 2019-01-03 LAB — CBG MONITORING, ED: Glucose-Capillary: 177 mg/dL — ABNORMAL HIGH (ref 70–99)

## 2019-01-03 LAB — LITHIUM LEVEL: Lithium Lvl: 1.38 mmol/L — ABNORMAL HIGH (ref 0.60–1.20)

## 2019-01-03 LAB — ETHANOL: Alcohol, Ethyl (B): <10 mg/dL (ref <10)

## 2019-01-03 MED ORDER — LISINOPRIL 20 MG PO TABS
40.0000 mg | ORAL_TABLET | Freq: Every day | ORAL | Status: DC
  Administered 2019-01-03 – 2019-01-04 (×2): 40 mg via ORAL
  Filled 2019-01-03 (×2): qty 2

## 2019-01-03 MED ORDER — ASPIRIN EC 81 MG PO TBEC
81.0000 mg | DELAYED_RELEASE_TABLET | Freq: Every day | ORAL | Status: DC
  Administered 2019-01-03 – 2019-01-04 (×2): 81 mg via ORAL
  Filled 2019-01-03 (×2): qty 1

## 2019-01-03 MED ORDER — AMLODIPINE BESYLATE 5 MG PO TABS
10.0000 mg | ORAL_TABLET | Freq: Every day | ORAL | Status: DC
  Administered 2019-01-03 – 2019-01-04 (×2): 10 mg via ORAL
  Filled 2019-01-03 (×3): qty 2

## 2019-01-03 MED ORDER — ZIPRASIDONE HCL 20 MG PO CAPS
40.0000 mg | ORAL_CAPSULE | Freq: Two times a day (BID) | ORAL | Status: DC
  Administered 2019-01-03 – 2019-01-04 (×2): 40 mg via ORAL
  Filled 2019-01-03 (×2): qty 2

## 2019-01-03 MED ORDER — SODIUM CHLORIDE 0.9 % IV BOLUS
1000.0000 mL | Freq: Once | INTRAVENOUS | Status: AC
  Administered 2019-01-03: 1000 mL via INTRAVENOUS

## 2019-01-03 MED ORDER — INSULIN ASPART 100 UNIT/ML ~~LOC~~ SOLN
0.0000 [IU] | Freq: Three times a day (TID) | SUBCUTANEOUS | Status: DC
  Administered 2019-01-04: 2 [IU] via SUBCUTANEOUS
  Filled 2019-01-03: qty 0.15

## 2019-01-03 MED ORDER — INSULIN GLARGINE 100 UNIT/ML ~~LOC~~ SOLN
10.0000 [IU] | Freq: Once | SUBCUTANEOUS | Status: AC
  Administered 2019-01-04: 10 [IU] via SUBCUTANEOUS
  Filled 2019-01-03: qty 0.1

## 2019-01-03 MED ORDER — METFORMIN HCL 500 MG PO TABS
1000.0000 mg | ORAL_TABLET | Freq: Two times a day (BID) | ORAL | Status: DC
  Administered 2019-01-04: 1000 mg via ORAL
  Filled 2019-01-03 (×2): qty 2

## 2019-01-03 MED ORDER — GLIPIZIDE ER 10 MG PO TB24
10.0000 mg | ORAL_TABLET | Freq: Every day | ORAL | Status: DC
  Administered 2019-01-04: 10 mg via ORAL
  Filled 2019-01-03 (×2): qty 1
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
## 2019-01-03 NOTE — Progress Notes (Signed)
Received Mitchell [name]  from the main ED with [employer], pt is IVC. He stated he is here to rest. He was given fluids to drink. I V was started at 2330 hrs He is holding the TV/Bed control up to his ear talking as if he was talking on the phone. This Probation officer asked who was he talking to and he disregarded my question. He slept for short intervals (2-3 hrs)  throughout the night and tinkled with the TV/bed control, bed, call light for the nurse. He was not able to give a reason for pushing the buttons and stated the buttons went off without his assistance.  He was back and forth to the bathroom, flushing the toilet and washing his hands.

## 2019-01-04 ENCOUNTER — Inpatient Hospital Stay (HOSPITAL_COMMUNITY)
Admission: AD | Admit: 2019-01-04 | Discharge: 2019-01-10 | DRG: 885 | Disposition: A | Discharge status: Home / Self Care | Payer: Medicare Other | Source: Intra-hospital | Attending: Psychiatry | Admitting: Psychiatry

## 2019-01-04 DIAGNOSIS — F319 Bipolar disorder, unspecified: Secondary | ICD-10-CM | POA: Diagnosis present

## 2019-01-04 DIAGNOSIS — R4183 Borderline intellectual functioning: Secondary | ICD-10-CM | POA: Diagnosis present

## 2019-01-04 DIAGNOSIS — Z79899 Other long term (current) drug therapy: Secondary | ICD-10-CM

## 2019-01-04 DIAGNOSIS — Z9114 Patient's other noncompliance with medication regimen: Secondary | ICD-10-CM | POA: Diagnosis not present

## 2019-01-04 DIAGNOSIS — J302 Other seasonal allergic rhinitis: Secondary | ICD-10-CM | POA: Diagnosis present

## 2019-01-04 DIAGNOSIS — Z9119 Patient's noncompliance with other medical treatment and regimen: Secondary | ICD-10-CM | POA: Diagnosis not present

## 2019-01-04 DIAGNOSIS — F259 Schizoaffective disorder, unspecified: Principal | ICD-10-CM | POA: Diagnosis present

## 2019-01-04 DIAGNOSIS — I69334 Monoplegia of upper limb following cerebral infarction affecting left non-dominant side: Secondary | ICD-10-CM | POA: Diagnosis not present

## 2019-01-04 DIAGNOSIS — F3160 Bipolar disorder, current episode mixed, unspecified: Secondary | ICD-10-CM | POA: Diagnosis not present

## 2019-01-04 DIAGNOSIS — G4733 Obstructive sleep apnea (adult) (pediatric): Secondary | ICD-10-CM | POA: Diagnosis present

## 2019-01-04 DIAGNOSIS — E118 Type 2 diabetes mellitus with unspecified complications: Secondary | ICD-10-CM | POA: Diagnosis not present

## 2019-01-04 DIAGNOSIS — F250 Schizoaffective disorder, bipolar type: Secondary | ICD-10-CM | POA: Diagnosis not present

## 2019-01-04 DIAGNOSIS — E1165 Type 2 diabetes mellitus with hyperglycemia: Secondary | ICD-10-CM | POA: Diagnosis present

## 2019-01-04 DIAGNOSIS — Z7984 Long term (current) use of oral hypoglycemic drugs: Secondary | ICD-10-CM

## 2019-01-04 DIAGNOSIS — I10 Essential (primary) hypertension: Secondary | ICD-10-CM | POA: Diagnosis present

## 2019-01-04 DIAGNOSIS — F79 Unspecified intellectual disabilities: Secondary | ICD-10-CM | POA: Diagnosis not present

## 2019-01-04 HISTORY — DX: Schizoaffective disorder, unspecified: F25.9

## 2019-01-04 LAB — LITHIUM LEVEL: Lithium Lvl: <0.06 mmol/L — ABNORMAL LOW (ref 0.60–1.20)

## 2019-01-04 LAB — CBG MONITORING, ED
Glucose-Capillary: 140 mg/dL — ABNORMAL HIGH (ref 70–99)
Glucose-Capillary: 121 mg/dL — ABNORMAL HIGH (ref 70–99)

## 2019-01-04 LAB — SARS CORONAVIRUS 2 BY RT PCR (HOSPITAL ORDER, PERFORMED IN ~~LOC~~ HOSPITAL LAB): SARS Coronavirus 2: NEGATIVE

## 2019-01-04 LAB — GLUCOSE, CAPILLARY: Glucose-Capillary: 195 mg/dL — ABNORMAL HIGH (ref 70–99)

## 2019-01-04 MED ORDER — TRAZODONE HCL 150 MG PO TABS
150.0000 mg | ORAL_TABLET | Freq: Once | ORAL | Status: AC
  Administered 2019-01-04: 21:00:00 150 mg via ORAL
  Filled 2019-01-04 (×2): qty 1

## 2019-01-04 MED ORDER — ALUM & MAG HYDROXIDE-SIMETH 200-200-20 MG/5ML PO SUSP: 30.0000 mL | ORAL | Status: DC | PRN

## 2019-01-04 MED ORDER — LISINOPRIL 40 MG PO TABS
40.0000 mg | ORAL_TABLET | Freq: Every day | ORAL | Status: DC
  Administered 2019-01-05 – 2019-01-10 (×6): 40 mg via ORAL
  Filled 2019-01-04 (×6): qty 1
  Filled 2019-01-04: qty 2
  Filled 2019-01-04 (×2): qty 1

## 2019-01-04 MED ORDER — MAGNESIUM HYDROXIDE 400 MG/5ML PO SUSP: 30.0000 mL | Freq: Every day | ORAL | Status: DC | PRN

## 2019-01-04 MED ORDER — GLIPIZIDE ER 10 MG PO TB24
10.0000 mg | ORAL_TABLET | Freq: Every day | ORAL | Status: DC
  Administered 2019-01-06 – 2019-01-10 (×5): 10 mg via ORAL
  Filled 2019-01-04 (×8): qty 1

## 2019-01-04 MED ORDER — AMLODIPINE BESYLATE 10 MG PO TABS
10.0000 mg | ORAL_TABLET | Freq: Every day | ORAL | Status: DC
  Administered 2019-01-05 – 2019-01-10 (×6): 10 mg via ORAL
  Filled 2019-01-04 (×5): qty 1
  Filled 2019-01-04: qty 2
  Filled 2019-01-04 (×4): qty 1

## 2019-01-04 MED ORDER — METFORMIN HCL 500 MG PO TABS
1000.0000 mg | ORAL_TABLET | Freq: Two times a day (BID) | ORAL | Status: DC
  Administered 2019-01-05 – 2019-01-10 (×11): 1000 mg via ORAL
  Filled 2019-01-04 (×16): qty 2

## 2019-01-04 MED ORDER — ASPIRIN 81 MG PO TBEC
81.0000 mg | DELAYED_RELEASE_TABLET | Freq: Every day | ORAL | Status: DC
  Administered 2019-01-05 – 2019-01-10 (×6): 81 mg via ORAL
  Filled 2019-01-04 (×10): qty 1

## 2019-01-04 MED ORDER — ACETAMINOPHEN 325 MG PO TABS: 650.0000 mg | ORAL_TABLET | Freq: Four times a day (QID) | ORAL | Status: DC | PRN

## 2019-01-04 MED ORDER — INSULIN ASPART 100 UNIT/ML ~~LOC~~ SOLN
0.0000 [IU] | Freq: Three times a day (TID) | SUBCUTANEOUS | Status: DC
  Administered 2019-01-05: 06:00:00 2 [IU] via SUBCUTANEOUS
  Administered 2019-01-05: 18:00:00 5 [IU] via SUBCUTANEOUS
  Administered 2019-01-06 (×2): 3 [IU] via SUBCUTANEOUS
  Administered 2019-01-07: 17:00:00 5 [IU] via SUBCUTANEOUS
  Administered 2019-01-07 – 2019-01-08 (×2): 3 [IU] via SUBCUTANEOUS
  Administered 2019-01-08 – 2019-01-09 (×2): 2 [IU] via SUBCUTANEOUS
  Administered 2019-01-09: 17:00:00 11 [IU] via SUBCUTANEOUS
  Administered 2019-01-10: 06:00:00 3 [IU] via SUBCUTANEOUS

## 2019-01-04 MED ORDER — ZIPRASIDONE HCL 40 MG PO CAPS
40.0000 mg | ORAL_CAPSULE | Freq: Two times a day (BID) | ORAL | Status: DC
  Administered 2019-01-05: 40 mg via ORAL
  Filled 2019-01-04 (×3): qty 1
  Filled 2019-01-04: qty 2
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
## 2019-01-04 NOTE — Progress Notes (Signed)
Pt accepted to [facility]; bed 407-1  Dr. Dwyane Dee is the accepting provider.    Dr. Jake Samples is the attending provider.    Call report to [phone]

## 2019-01-04 NOTE — ED Notes (Signed)
Pt transferred safely with [employer].  All belongings were sent with patient.  Pt was calm and cooperative.

## 2019-01-04 NOTE — Progress Notes (Signed)
Patient ID: Mitchell [name], male   DOB: [birth year], 58 y.o.   MRN: [id number]  Pt has been accepted to [facility], bed 407. Pt is in need of inpatient hospitalization for further stabilization.   Ethelene Hal, PMHNP-BC 01/04/2019       1545

## 2019-01-04 NOTE — ED Notes (Signed)
Left message with [facility] to call for report.

## 2019-01-05 ENCOUNTER — Encounter (HOSPITAL_COMMUNITY): Payer: Self-pay

## 2019-01-05 ENCOUNTER — Other Ambulatory Visit: Payer: Self-pay

## 2019-01-05 DIAGNOSIS — F250 Schizoaffective disorder, bipolar type: Secondary | ICD-10-CM

## 2019-01-05 LAB — GLUCOSE, CAPILLARY
Glucose-Capillary: 137 mg/dL — ABNORMAL HIGH (ref 70–99)
Glucose-Capillary: 230 mg/dL — ABNORMAL HIGH (ref 70–99)
Glucose-Capillary: 179 mg/dL — ABNORMAL HIGH (ref 70–99)

## 2019-01-05 MED ORDER — HALOPERIDOL 5 MG PO TABS
15.0000 mg | ORAL_TABLET | Freq: Every day | ORAL | Status: DC
  Administered 2019-01-05 – 2019-01-09 (×5): 15 mg via ORAL
  Filled 2019-01-05 (×8): qty 3

## 2019-01-05 MED ORDER — TEMAZEPAM 30 MG PO CAPS
30.0000 mg | ORAL_CAPSULE | Freq: Every day | ORAL | Status: DC
  Administered 2019-01-05 – 2019-01-09 (×5): 30 mg via ORAL
  Filled 2019-01-05 (×4): qty 1

## 2019-01-05 MED ORDER — LITHIUM CARBONATE ER 450 MG PO TBCR
450.0000 mg | EXTENDED_RELEASE_TABLET | Freq: Two times a day (BID) | ORAL | Status: DC
  Administered 2019-01-05 – 2019-01-10 (×10): 450 mg via ORAL
  Filled 2019-01-05 (×17): qty 1

## 2019-01-05 MED ORDER — LORAZEPAM 1 MG PO TABS: 1.0000 mg | ORAL_TABLET | ORAL | Status: DC | PRN

## 2019-01-05 MED ORDER — BENZTROPINE MESYLATE 1 MG PO TABS
1.0000 mg | ORAL_TABLET | Freq: Two times a day (BID) | ORAL | Status: DC
  Administered 2019-01-05 – 2019-01-10 (×10): 1 mg via ORAL
  Filled 2019-01-05 (×17): qty 1

## 2019-01-05 MED ORDER — HALOPERIDOL 5 MG PO TABS
5.0000 mg | ORAL_TABLET | Freq: Every day | ORAL | Status: DC
  Administered 2019-01-06 – 2019-01-10 (×5): 5 mg via ORAL
  Filled 2019-01-05 (×8): qty 1

## 2019-01-05 MED ORDER — ZIPRASIDONE MESYLATE 20 MG IM SOLR
20.0000 mg | Freq: Two times a day (BID) | INTRAMUSCULAR | Status: DC | PRN
  Administered 2019-01-08: 17:00:00 20 mg via INTRAMUSCULAR
  Filled 2019-01-05: qty 20

## 2019-01-05 NOTE — Progress Notes (Signed)
Pt pacing in his room, talking to people not seen by staff, pt standing outside his room talking to people not seen by staff
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
## 2019-01-05 NOTE — Progress Notes (Signed)
Pt has been putting paper towels in the toilet, causing the toilet to overflow.

## 2019-01-05 NOTE — Progress Notes (Signed)
Pt up trying to use the phone , even after being told the phones were cut off

## 2019-01-05 NOTE — Progress Notes (Signed)
Pt on the unit from previous shift. Pt admission finished as best as could , due to pt having disorganized thought content and delusional thinking during assessment. Pt denied SI/ HI/ AVH , pt was observed talking to people not seen by staff through out the evening.

## 2019-01-05 NOTE — Progress Notes (Signed)
Recreation Therapy Notes  INPATIENT RECREATION THERAPY ASSESSMENT  Patient Details Name: EDER MACEK MRN: [id number] DOB: 05/03/1960 Today's Date: 01/05/2019       Information Obtained From: Patient  Able to Participate in Assessment/Interview: Yes  Patient Presentation: Alert  Reason for Admission (Per Patient): Other (Comments)(Pt stated he was here for acting like a nut.)  Patient Stressors: (None identified)  Coping Skills:   Isolation, Journal, Sports, TV, Music, Exercise, Meditate, Deep Breathing, Talk, Art, Prayer, Avoidance, Read  Leisure Interests (2+):  Individual - Reading, Music - Listen  Frequency of Recreation/Participation: Other (Comment)(Daily)  Awareness of Community Resources:  Yes  Community Resources:  Park, Art therapist  Current Use: Yes  If no, Barriers?:    Expressed Interest in [place]: No  [employer] of Residence:  Investment banker, corporate  Patient Main Form of Transportation: Diplomatic Services operational officer  Patient Strengths:  Physical strength  Patient Identified Areas of Improvement:  Read bible to self; think  Patient Goal for Hospitalization:  "be with the people I love, respect self and others"  Current SI (including self-harm):  No  Current HI:  No  Current AVH: No  Staff Intervention Plan: Group Attendance, Collaborate with Interdisciplinary Treatment Team  Consent to Intern Participation: N/A   Victorino Sparrow, LRT/CTRS  Victorino Sparrow A 01/05/2019, 11:53 AM
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
## 2019-01-05 NOTE — Tx Team (Signed)
Initial Treatment Plan 01/05/2019 3:54 AM Mitchell [name] OXB:[id number]    PATIENT STRESSORS: Financial difficulties Marital or family conflict Medication change or noncompliance   PATIENT STRENGTHS: General fund of knowledge Motivation for treatment/growth   PATIENT IDENTIFIED PROBLEMS:  risk for suicide  Psychosis   " help everybody"                 DISCHARGE CRITERIA:  Improved stabilization in mood, thinking, and/or behavior Verbal commitment to aftercare and medication compliance  PRELIMINARY DISCHARGE PLAN: Attend aftercare/continuing care group Outpatient therapy  PATIENT/FAMILY INVOLVEMENT: This treatment plan has been presented to and reviewed with the patient, Mitchell [name].  The patient and family have been given the opportunity to ask questions and make suggestions.  Providence Crosby, RN 01/05/2019, 3:54 AM

## 2019-01-05 NOTE — Progress Notes (Signed)
D: Pt denies SI/HI/AVH. Pt is pleasant and cooperative. Pt disorganized at times and seen talking to people not there.   A: Pt was offered support and encouragement. Pt was given scheduled medications. Pt was encourage to attend groups. Q 15 minute checks were done for safety.   R:Pt attends groups and interacts well with peers and staff. Pt is taking medication. Pt has no complaints.Pt receptive to treatment and safety maintained on unit.

## 2019-01-05 NOTE — [facility] Suicide Risk Assessment (Signed)
[facility] Admission Suicide Risk Assessment   Nursing information obtained from:  Patient Demographic factors:  Male, Living alone Current Mental Status:  NA Loss Factors:  Financial problems / change in socioeconomic status Historical Factors:  NA Risk Reduction Factors:  NA  Total Time spent with patient: 45 minutes Principal Problem: Exacerbation of psychotic disorder Diagnosis:  Active Problems:   Schizoaffective disorder (HCC)  Subjective Data: Presents disorganized with disjointed statements  Continued Clinical Symptoms:    The "Alcohol Use Disorders Identification Test", Guidelines for Use in Primary Care, Second Edition.  World Pharmacologist [facility]). Score between 0-7:  no or low risk or alcohol related problems. Score between 8-15:  moderate risk of alcohol related problems. Score between 16-19:  high risk of alcohol related problems. Score 20 or above:  warrants further diagnostic evaluation for alcohol dependence and treatment.   CLINICAL FACTORS:   Schizophrenia:   Paranoid or undifferentiated type  Musculoskeletal: Strength & Muscle Tone: within normal limits Gait & Station: normal Patient leans: N/A  Psychiatric Specialty Exam: Physical Exam  Nursing note and vitals reviewed. Constitutional: He appears well-developed and well-nourished.  Cardiovascular: Normal rate and regular rhythm.    Review of Systems  Constitutional: Negative.   HENT: Negative.   Cardiovascular: Negative.   Gastrointestinal: Negative.   Genitourinary: Negative.   Neurological: Negative.   Endo/Heme/Allergies: Negative.   Positive for diabetes/hypertension  There were no vitals taken for this visit.There is no height or weight on file to calculate BMI.  General Appearance: Casual  Eye Contact:  Fair  Speech:  Slow  Volume:  Decreased  Mood:  Euthymic  Affect:  Restricted  Thought Process:  Disorganized and Descriptions of Associations: Loose  Orientation:  Other:  Unable to  articulate but obviously to person  Thought Content:  Illogical and Disorganized  Suicidal Thoughts:  No  Homicidal Thoughts:  No  Memory:  Immediate;   Poor Recent;   Poor Remote;   Poor  Judgement:  Impaired  Insight:  Lacking  Psychomotor Activity:  Normal  Concentration:  Concentration: Poor and Attention Span: Poor  Recall:  Poor  Fund of Knowledge:  Poor  Language:  Poor  Akathisia:  Negative  Handed:  Right  AIMS (if indicated):     Assets:  Housing Physical Health Resilience  ADL's:  Impaired  Cognition:  WNL  Sleep:  Number of Hours: 4.75      COGNITIVE FEATURES THAT CONTRIBUTE TO RISK:  Loss of executive function    SUICIDE RISK:   Minimal: No identifiable suicidal ideation.  Patients presenting with no risk factors but with morbid ruminations; may be classified as minimal risk based on the severity of the depressive symptoms  PLAN OF CARE: resume meds  I certify that inpatient services furnished can reasonably be expected to improve the patient's condition.   Johnn Hai, MD 01/05/2019, 1:46 PM

## 2019-01-05 NOTE — Tx Team (Signed)
Interdisciplinary Treatment and Diagnostic Plan Update  01/05/2019 Time of Session: 10:10am  Mitchell [name] MRN: [id number]  Principal Diagnosis: <principal problem not specified>  Secondary Diagnoses: Active Problems:   Schizoaffective disorder (HCC)   Current Medications:  Current Facility-Administered Medications  Medication Dose Route Frequency Provider Last Rate Last Dose  . acetaminophen (TYLENOL) tablet 650 mg  650 mg Oral Q6H PRN Ethelene Hal, NP      . alum & mag hydroxide-simeth (MAALOX/MYLANTA) 200-200-20 MG/5ML suspension 30 mL  30 mL Oral Q4H PRN Ethelene Hal, NP      . amLODipine (NORVASC) tablet 10 mg  10 mg Oral Daily Ethelene Hal, NP   10 mg at 01/05/19 5701  . aspirin EC tablet 81 mg  81 mg Oral Daily Ethelene Hal, NP   81 mg at 01/05/19 7793  . glipiZIDE (GLUCOTROL XL) 24 hr tablet 10 mg  10 mg Oral Q breakfast Ethelene Hal, NP      . insulin aspart (novoLOG) injection 0-15 Units  0-15 Units Subcutaneous TID WC Ethelene Hal, NP   2 Units at 01/05/19 [phone]  . lisinopril (ZESTRIL) tablet 40 mg  40 mg Oral Daily Ethelene Hal, NP   40 mg at 01/05/19 0923  . magnesium hydroxide (MILK OF MAGNESIA) suspension 30 mL  30 mL Oral Daily PRN Ethelene Hal, NP      . metFORMIN (GLUCOPHAGE) tablet 1,000 mg  1,000 mg Oral BID WC Ethelene Hal, NP   1,000 mg at 01/05/19 0807  . ziprasidone (GEODON) capsule 40 mg  40 mg Oral BID WC Ethelene Hal, NP   40 mg at 01/05/19 3007   PTA Medications: Medications Prior to Admission  Medication Sig Dispense Refill Last Dose  . lithium carbonate (ESKALITH) 450 MG CR tablet Take 2 tablets (900 mg total) by mouth at bedtime. (Patient not taking: Reported on 11/27/2018) 60 tablet 1   . PRESCRIPTION MEDICATION Take 1 tablet by mouth daily. Blood pressure medication       Patient Stressors: Financial difficulties Marital or family conflict Medication change or  noncompliance  Patient Strengths: Technical sales engineer for treatment/growth  Treatment Modalities: Medication Management, Group therapy, Case management,  1 to 1 session with clinician, Psychoeducation, Recreational therapy.   Physician Treatment Plan for Primary Diagnosis: <principal problem not specified> Long Term Goal(s):     Short Term Goals:    Medication Management: Evaluate patient's response, side effects, and tolerance of medication regimen.  Therapeutic Interventions: 1 to 1 sessions, Unit Group sessions and Medication administration.  Evaluation of Outcomes: Not Met  Physician Treatment Plan for Secondary Diagnosis: Active Problems:   Schizoaffective disorder ([place])  Long Term Goal(s):     Short Term Goals:       Medication Management: Evaluate patient's response, side effects, and tolerance of medication regimen.  Therapeutic Interventions: 1 to 1 sessions, Unit Group sessions and Medication administration.  Evaluation of Outcomes: Not Met   RN Treatment Plan for Primary Diagnosis: <principal problem not specified> Long Term Goal(s): Knowledge of disease and therapeutic regimen to maintain health will improve  Short Term Goals: Ability to participate in decision making will improve, Ability to verbalize feelings will improve, Ability to disclose and discuss suicidal ideas, Ability to identify and develop effective coping behaviors will improve and Compliance with prescribed medications will improve  Medication Management: RN will administer medications as ordered by provider, will assess and evaluate patient's response and provide  education to patient for prescribed medication. RN will report any adverse and/or side effects to prescribing provider.  Therapeutic Interventions: 1 on 1 counseling sessions, Psychoeducation, Medication administration, Evaluate responses to treatment, Monitor vital signs and CBGs as ordered, Perform/monitor CIWA, COWS,  AIMS and Fall Risk screenings as ordered, Perform wound care treatments as ordered.  Evaluation of Outcomes: Not Met   LCSW Treatment Plan for Primary Diagnosis: <principal problem not specified> Long Term Goal(s): Safe transition to appropriate next level of care at discharge, Engage patient in therapeutic group addressing interpersonal concerns.  Short Term Goals: Engage patient in aftercare planning with referrals and resources and Increase skills for wellness and recovery  Therapeutic Interventions: Assess for all discharge needs, 1 to 1 time with Social worker, Explore available resources and support systems, Assess for adequacy in community support network, Educate family and significant other(s) on suicide prevention, Complete Psychosocial Assessment, Interpersonal group therapy.  Evaluation of Outcomes: Not Met   Progress in Treatment: Attending groups: No. Participating in groups: No. Taking medication as prescribed: Yes. Toleration medication: Yes. Family/Significant other contact made: No, will contact:  if given consent  Patient understands diagnosis: Yes. Discussing patient identified problems/goals with staff: Yes. Medical problems stabilized or resolved: Yes. Denies suicidal/homicidal ideation: Yes. Issues/concerns per patient self-inventory: No. Other:   New problem(s) identified:   New Short Term/Long Term Goal(s): Medication stabilization, elimination of SI thoughts, and development of a comprehensive mental wellness plan.   Patient Goals:  "spend time with family"  Discharge Plan or Barriers: CSW will continue to follow up for appropriate referrals and possible discharge planning  Reason for Continuation of Hospitalization: Mania  Estimated Length of Stay: 3-5 days   Attendees: Patient: Mitchell [name]  01/05/2019 11:12 AM  Physician: Dr. Jake Samples, MD 01/05/2019 11:12 AM  Nursing: Vladimir Faster, RN 01/05/2019 11:12 AM  RN Care Manager: 01/05/2019 11:12 AM  Social Worker:  [place], Marlinda Mike 01/05/2019 11:12 AM  Recreational Therapist:  01/05/2019 11:12 AM  Other: Ovidio Kin, MSW intern 01/05/2019 11:12 AM  Other:  01/05/2019 11:12 AM  Other: 01/05/2019 11:12 AM    Scribe for Treatment Team: Billey Chang, [place] [employer] 01/05/2019 11:25 AM

## 2019-01-05 NOTE — Progress Notes (Signed)
Recreation Therapy Notes  Date: 10.9.20 Time: 1000 Location: [address] Dayroom  Group Topic: Goal Setting  Goal Area(s) Addresses:  Patient will be able to identify at least 3 life goals.  Patient will be able to identify benefit of investing in life goals.  Patient will be able to identify benefit of setting life goals.   Behavioral Response:  None  Intervention:  Worksheet  Activity: Goal Planning.  Patients were to identify what they wanted to accomplish in a week, month, year and 5 years.  Patients would then identify any obstacles they may face, what they need to achieve goals and what they can start doing now to work towards goals.  Education:  Discharge Planning, Radiographer, therapeutic, Leisure Education   Education Outcome: Acknowledges Education/In Group Clarification Provided/Needs Additional Education  Clinical Observations:  Pt drew circles all over his paper.  Pt was unable to complete worksheet.    Victorino Sparrow, LRT/CTRS         Ria Comment, Shauntae Reitman A 01/05/2019 11:20 AM

## 2019-01-05 NOTE — Progress Notes (Signed)
Pt was up singing in his room some through the night, pt was noted doing this at [facility] also per notes.
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
## 2019-01-05 NOTE — Progress Notes (Signed)
Patient ID: Mitchell [name], male   DOB: December 16, 1960, 58 y.o.   MRN: [id number]   CSW attempted to do patient's assessment but patient went outside for recreational time. CSW will reattempt later. This was patient's first attempt at assessment.

## 2019-01-06 DIAGNOSIS — E118 Type 2 diabetes mellitus with unspecified complications: Secondary | ICD-10-CM

## 2019-01-06 DIAGNOSIS — Z9119 Patient's noncompliance with other medical treatment and regimen: Secondary | ICD-10-CM

## 2019-01-06 DIAGNOSIS — I10 Essential (primary) hypertension: Secondary | ICD-10-CM

## 2019-01-06 DIAGNOSIS — F79 Unspecified intellectual disabilities: Secondary | ICD-10-CM

## 2019-01-06 DIAGNOSIS — E1165 Type 2 diabetes mellitus with hyperglycemia: Secondary | ICD-10-CM

## 2019-01-06 LAB — GLUCOSE, CAPILLARY
Glucose-Capillary: 161 mg/dL — ABNORMAL HIGH (ref 70–99)
Glucose-Capillary: 82 mg/dL (ref 70–99)
Glucose-Capillary: 159 mg/dL — ABNORMAL HIGH (ref 70–99)
Glucose-Capillary: 236 mg/dL — ABNORMAL HIGH (ref 70–99)
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
## 2019-01-06 NOTE — Progress Notes (Signed)
Adult Psychoeducational Group Note  Date:  01/06/2019 Time:  9:06 PM  Group Topic/Focus:  Wrap-Up Group:   The focus of this group is to help patients review their daily goal of treatment and discuss progress on daily workbooks.  Participation Level:  Active  Participation Quality:  Appropriate  Affect:  Appropriate  Cognitive:  Appropriate  Insight: Appropriate  Engagement in Group:  Developing/Improving  Modes of Intervention:  Discussion  Additional Comments:  Pt stated his goal for today was to focus on his treatment plan. Pt stated he felt he accomplished his goal today.   Pt stated his relationship with his family has improved. Pt stated contacting his wife today help improve his day. Pt stated he felt better about himself today. Pt rated his overall day a 10. Pt stated his appetite was pretty good today. Pt stated his goal for tonight was to get some rest. Pt did not complain of pain tonight. Pt stated he was not hearing are seeing anything that was not there.  Pt stated he had no thoughts of harming himself are  others.Pt stated he would alert staff if anything changes.  Candy Sledge 01/06/2019, 9:06 PM

## 2019-01-06 NOTE — [facility] Group Notes (Signed)
  [facility] LCSW Group Therapy Note  Date/Time:  01/06/2019 11:15AM-12:00PM  Type of Therapy and Topic:  Group Therapy:  Feelings About Hospitalization  Participation Level:  Active   Description of Group This process group involved patients discussing their feelings related to being hospitalized, as well as the benefits they see to being in the hospital.  These feelings and benefits were itemized.  The group then brainstormed specific ways in which they could seek those same benefits when they discharge and return home.  Therapeutic Goals 1. Patient will identify and describe positive and negative feelings related to hospitalization 2. Patient will verbalize benefits of hospitalization to themselves personally 3. Patients will brainstorm together ways they can obtain similar benefits in the outpatient setting, identify barriers to wellness and possible solutions  Summary of Patient Progress:  The patient expressed his primary feelings about being hospitalized are "grateful to be here."  He said it gives him a chance to think about his mistakes.  After this, he spoke frequently but in almost a whisper and could not be heard.  CSW asked him multiple times to speak up but his voice did not rise sufficiently to be heard.  Toward the end of group he said all the answers for our discussion were in the Bible, and he proceeded to read "a verse" from a Bible; however, the patient sitting next to him told him the Bible he was reading from was in [place] and CSW verified this after group.  The patient was willing to come to group, did not show any insight.  Therapeutic Modalities Cognitive Behavioral Therapy Motivational Interviewing    Selmer Dominion, LCSW 01/06/2019, 1:38 PM

## 2019-01-06 NOTE — Plan of Care (Signed)
  Problem: Health Behavior/Discharge Planning: Goal: Compliance with prescribed medication regimen will improve Outcome: Progressing   Problem: Safety: Goal: Ability to redirect hostility and anger into socially appropriate behaviors will improve Outcome: Progressing

## 2019-01-06 NOTE — Progress Notes (Signed)
Adult Psychoeducational Group Note  Date:  01/06/2019 Time:  1:37 AM  Group Topic/Focus:  Wrap-Up Group:   The focus of this group is to help patients review their daily goal of treatment and discuss progress on daily workbooks.  Participation Level:  Minimal  Participation Quality:  Appropriate  Affect:  Appropriate  Cognitive:  Disorganized and Confused  Insight: Limited  Engagement in Group:  Limited  Modes of Intervention:  Discussion  Additional Comments:  Pt stated his goal for today was to focus on his treatment plan. Pt stated he felt he accomplished his goal today.   Pt stated his relationship with his family has improved since he was admitted here. Pt stated he felt better about himself today. Pt rated his overall day a 10. Pt stated his appetite was pretty good today. Pt stated his goal for tonight was to get some rest. Pt did not complain of pain tonight. Pt stated he was not hearing or seeing anything that was not there.  Pt stated he had no thoughts of harming himself or others. Pt stated he would alert staff if anything changes.  Candy Sledge 01/06/2019, 1:37 AM

## 2019-01-06 NOTE — Progress Notes (Signed)
Patient ID: Mitchell [name], male   DOB: 02-06-1961, 59 y.o.   MRN: [id number]   [facility] NOVEL CORONAVIRUS (COVID-19) DAILY CHECK-OFF SYMPTOMS - answer yes or no to each - every day NO YES  Have you had a fever in the past 24 hours?  . Fever (Temp > 37.80C / 100F) X   Have you had any of these symptoms in the past 24 hours? . New Cough .  Sore Throat  .  Shortness of Breath .  Difficulty Breathing .  Unexplained Body Aches   X   Have you had any one of these symptoms in the past 24 hours not related to allergies?   . Runny Nose .  Nasal Congestion .  Sneezing   X   If you have had runny nose, nasal congestion, sneezing in the past 24 hours, has it worsened?  X   EXPOSURES - check yes or no X   Have you traveled outside the state in the past 14 days?  X   Have you been in contact with someone with a confirmed diagnosis of COVID-19 or PUI in the past 14 days without wearing appropriate PPE?  X   Have you been living in the same home as a person with confirmed diagnosis of COVID-19 or a PUI (household contact)?    X   Have you been diagnosed with COVID-19?    X              What to do next: Answered NO to all: Answered YES to anything:   Proceed with unit schedule Follow the [facility] Inpatient Flowsheet.
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
## 2019-01-06 NOTE — Progress Notes (Signed)
Pt heard talking to people not there, but can not distinguish if it is psychosis.

## 2019-01-07 LAB — LITHIUM LEVEL: Lithium Lvl: 0.35 mmol/L — ABNORMAL LOW (ref 0.60–1.20)

## 2019-01-07 LAB — HEMOGLOBIN A1C
Hgb A1c MFr Bld: 8.7 % — ABNORMAL HIGH (ref 4.8–5.6)
Mean Plasma Glucose: 202.99 mg/dL

## 2019-01-07 LAB — TSH: TSH: 2.264 u[IU]/mL (ref 0.350–4.500)

## 2019-01-07 LAB — GLUCOSE, CAPILLARY
Glucose-Capillary: 148 mg/dL — ABNORMAL HIGH (ref 70–99)
Glucose-Capillary: 99 mg/dL (ref 70–99)
Glucose-Capillary: 232 mg/dL — ABNORMAL HIGH (ref 70–99)
Glucose-Capillary: 142 mg/dL — ABNORMAL HIGH (ref 70–99)

## 2019-01-07 MED ORDER — HALOPERIDOL DECANOATE 100 MG/ML IM SOLN
50.0000 mg | Freq: Once | INTRAMUSCULAR | Status: AC
  Administered 2019-01-07: 10:00:00 50 mg via INTRAMUSCULAR
  Filled 2019-01-07: qty 0.5
  Filled 2019-01-07: qty 1

## 2019-01-07 NOTE — Progress Notes (Signed)
D. Pt visible in the milieu throughout the shift- friendly during interactions-calm and cooperative,somewhat bizarre at times Pt currently denies SI/HI and AVH  A. Labs and vitals monitored. Pt compliant with medications. Pt supported emotionally and encouraged to express concerns and ask questions.   R. Pt remains safe with 15 minute checks. Will continue POC.

## 2019-01-07 NOTE — Progress Notes (Signed)
[facility] NOVEL CORONAVIRUS (COVID-19) DAILY CHECK-OFF SYMPTOMS - answer yes or no to each - every day NO YES  Have you had a fever in the past 24 hours?  . Fever (Temp > 37.80C / 100F) X   Have you had any of these symptoms in the past 24 hours? . New Cough .  Sore Throat  .  Shortness of Breath .  Difficulty Breathing .  Unexplained Body Aches   X   Have you had any one of these symptoms in the past 24 hours not related to allergies?   . Runny Nose .  Nasal Congestion .  Sneezing   X   If you have had runny nose, nasal congestion, sneezing in the past 24 hours, has it worsened?  X   EXPOSURES - check yes or no X   Have you traveled outside the state in the past 14 days?  X   Have you been in contact with someone with a confirmed diagnosis of COVID-19 or PUI in the past 14 days without wearing appropriate PPE?  X   Have you been living in the same home as a person with confirmed diagnosis of COVID-19 or a PUI (household contact)?    X   Have you been diagnosed with COVID-19?    X              What to do next: Answered NO to all: Answered YES to anything:   Proceed with unit schedule Follow the [facility] Inpatient Flowsheet.   

## 2019-01-07 NOTE — Progress Notes (Signed)
Adult Psychoeducational Group Note  Date:  01/07/2019 Time:  11:28 PM  Group Topic/Focus:  Wrap-Up Group:   The focus of this group is to help patients review their daily goal of treatment and discuss progress on daily workbooks.  Participation Level:  Active  Participation Quality:  Appropriate  Affect:  Appropriate  Cognitive:  Appropriate  Insight: Appropriate  Engagement in Group:  Developing/Improving  Modes of Intervention:  Discussion  Additional Comments:  Pt stated his goal for today was to focus on his treatment plan. Pt stated he felt he accomplished his goal today.   Pt stated his relationship with his family has improved since he was admitted here. Pt stated he felt better about himself today. Pt rated his overall day a 10. Pt stated his appetite was pretty good today. Pt stated his goal for tonight was to get some rest. Pt did not complain of pain tonight. Pt stated he was not hearing or seeing anything that was not there.  Pt stated he had no thoughts of harming himself or others. Pt stated he would alert staff if anything changes.  Candy Sledge 01/07/2019, 11:28 PM

## 2019-01-07 NOTE — [facility] Group Notes (Signed)
[facility] Group Notes:  (Nursing/MHT/Case Management/Adjunct)  Date:  01/07/2019  Time:  900 am  Type of Therapy:  Nurse Education  Participation Level:  Active  Participation Quality:  Appropriate  Affect:  Appropriate  Cognitive:  Disorganized  Insight:  Limited  Engagement in Group:  Engaged  Modes of Intervention:  Discussion and Education  Summary of Progress/Problems:  Mitchell [name] 01/07/2019, 11:29 AM
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
## 2019-01-07 NOTE — [facility] Group Notes (Signed)
[facility] LCSW Group Therapy Note  Date/Time:  01/07/2019  11:00AM-12:00PM  Type of Therapy and Topic:  Group Therapy:  Music and Mood  Participation Level:  Active   Description of Group: In this process group, members listened to a variety of genres of music and identified that different types of music evoke different responses.  Patients were encouraged to identify music that was soothing for them and music that was energizing for them.  Patients discussed how this knowledge can help with wellness and recovery in various ways including managing depression and anxiety as well as encouraging healthy sleep habits.    Therapeutic Goals: 1. Patients will explore the impact of different varieties of music on mood 2. Patients will verbalize the thoughts they have when listening to different types of music 3. Patients will identify music that is soothing to them as well as music that is energizing to them 4. Patients will discuss how to use this knowledge to assist in maintaining wellness and recovery 5. Patients will explore the use of music as a coping skill  Summary of Patient Progress:  At the beginning of group, patient was not present, but when he arrived after about 15 minutes, he expressed that he felt "great."  He danced in his chair and said each song was "wonderful."  At the end of group, patient expressed that he felt "fantastic."    Therapeutic Modalities: Solution Focused Brief Therapy Activity   Selmer Dominion, LCSW

## 2019-01-07 NOTE — Progress Notes (Signed)
Patient has been anxious and fidgety tonight. He has been standing at the entrance to [address] and has to be reminded to move back. He has been kissing on the window at the door. When in his room he has been constantly flushing the toilet and has to be redirected. Safety maintained with 15 min checks.

## 2019-01-08 LAB — GLUCOSE, CAPILLARY
Glucose-Capillary: 146 mg/dL — ABNORMAL HIGH (ref 70–99)
Glucose-Capillary: 114 mg/dL — ABNORMAL HIGH (ref 70–99)
Glucose-Capillary: 171 mg/dL — ABNORMAL HIGH (ref 70–99)
Glucose-Capillary: 192 mg/dL — ABNORMAL HIGH (ref 70–99)

## 2019-01-08 NOTE — Progress Notes (Signed)
D: Pt denies SI/HI/AVH. Pt is pleasant and cooperative. Pt visible on milieu "I'm 1000 % better" A: Pt was offered support and encouragement. Pt was given scheduled medications. Pt was encourage to attend groups. Q 15 minute checks were done for safety.  R: safety maintained on unit.

## 2019-01-08 NOTE — Progress Notes (Signed)
Patient ID: Mitchell [name], male   DOB: 05/14/1960, 58 y.o.   MRN: [id number]   [place] NOVEL CORONAVIRUS (COVID-19) DAILY CHECK-OFF SYMPTOMS - answer yes or no to each - every day NO YES  Have you had a fever in the past 24 hours?  . Fever (Temp > 37.80C / 100F) X   Have you had any of these symptoms in the past 24 hours? . New Cough .  Sore Throat  .  Shortness of Breath .  Difficulty Breathing .  Unexplained Body Aches   X   Have you had any one of these symptoms in the past 24 hours not related to allergies?   . Runny Nose .  Nasal Congestion .  Sneezing   X   If you have had runny nose, nasal congestion, sneezing in the past 24 hours, has it worsened?  X   EXPOSURES - check yes or no X   Have you traveled outside the state in the past 14 days?  X   Have you been in contact with someone with a confirmed diagnosis of COVID-19 or PUI in the past 14 days without wearing appropriate PPE?  X   Have you been living in the same home as a person with confirmed diagnosis of COVID-19 or a PUI (household contact)?    X   Have you been diagnosed with COVID-19?    X              What to do next: Answered NO to all: Answered YES to anything:   Proceed with unit schedule Follow the [facility] Inpatient Flowsheet.   

## 2019-01-08 NOTE — Progress Notes (Signed)
Patient observed up in the dayroom watching tv. He was much calmer and not needing to be redirected tonight. It remains difficult to understand him when he talks. Writer can understand him when he speaks of taking care of his wife. Safety maintained with 15 min checks/

## 2019-01-08 NOTE — Progress Notes (Signed)
Adult Psychoeducational Group Note  Date:  01/08/2019 Time:  10:38 PM  Group Topic/Focus:  Wrap-Up Group:   The focus of this group is to help patients review their daily goal of treatment and discuss progress on daily workbooks.  Participation Level:  Active  Participation Quality:  Appropriate  Affect:  Appropriate  Cognitive:  Appropriate  Insight: Appropriate  Engagement in Group:  Developing/Improving  Modes of Intervention:  Discussion  Additional Comments: Pt stated his goal for today was to focus on his treatment plan. Pt stated he felt he accomplished his goal today. Pt stated his relationship with his family has improved since he was admitted here. Pt stated he was able to contact his wife today, which improved his day. Pt stated he felt better about himself today. Pt rated his overall day a 10. Pt stated his appetite was pretty good today. Pt stated his goal for tonight was to get some rest. Pt did not complain of pain tonight. Pt stated he was not hearing or seeing anything that was not there.  Pt stated he had no thoughts of harming himself or others. Pt stated he would alert staff if anything changes.   Candy Sledge 01/08/2019, 10:38 PM

## 2019-01-08 NOTE — Plan of Care (Signed)
  Problem: Coping: Goal: Coping ability will improve Outcome: Progressing   Problem: Health Behavior/Discharge Planning: Goal: Compliance with prescribed medication regimen will improve Outcome: Progressing   Problem: Nutritional: Goal: Ability to achieve adequate nutritional intake will improve Outcome: Progressing   Problem: Role Relationship: Goal: Ability to interact with others will improve Outcome: Not Progressing
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
## 2019-01-08 NOTE — Progress Notes (Signed)
[facility] MD Progress Note  01/08/2019 1:44 PM Mitchell [name]  MRN:  [id number] Subjective:   Patient sluggish in the morning but more alert as the morning went on he is oriented to person place situation he gives vague answers but denies auditory or visual hallucinations.  No thoughts of harming self or others still unable to articulate exact reasons for hospitalization.  No involuntary movements tolerating meds well Principal Problem: Chronic psychosis/decreasing level of functioning Diagnosis: Active Problems:   Schizoaffective disorder (HCC)  Total Time spent with patient: 20 minutes  Past Psychiatric History: Long history of antipsychotic meds  Past Medical History:  Past Medical History:  Diagnosis Date  . Bipolar affective disorder (HCC)    takes Zyprexa daily  . Diabetes mellitus    takes Victoza,Metformin,and Glipizide daily  . Hypertension    takes Amlodipine,Lisinopril and Clonidine daily  . Hyponatremia    history of  . Mental disorder    takes Lithium daily  . Schizoaffective disorder    takes Trazodone nightly  . Seasonal allergies    takes Claritin daily  . Sleep apnea    sleep study >45yrs ago  . Stroke [facility])    left arm weakness    Past Surgical History:  Procedure Laterality Date  . CATARACT EXTRACTION W/PHACO Right 02/14/2013   Procedure: CATARACT EXTRACTION PHACO AND INTRAOCULAR LENS PLACEMENT (IOC);  Surgeon: Shade Flood, MD;  Location: [facility] OR;  Service: Ophthalmology;  Laterality: Right;  . CATARACT EXTRACTION W/PHACO Left 06/13/2013   Procedure: CATARACT EXTRACTION PHACO AND INTRAOCULAR LENS PLACEMENT (IOC);  Surgeon: Shade Flood, MD;  Location: [facility] OR;  Service: Ophthalmology;  Laterality: Left;  . CIRCUMCISION  20 yrs. ago  . EYE SURGERY     Family History: History reviewed. No pertinent family history. Family Psychiatric  History: No new data Social History:  Social History   Substance and Sexual Activity  Alcohol Use No     Social History    Substance and Sexual Activity  Drug Use No    Social History   Socioeconomic History  . Marital status: Divorced    Spouse name: Not on file  . Number of children: Not on file  . Years of education: Not on file  . Highest education level: Not on file  Occupational History  . Not on file  Social Needs  . Financial resource strain: Not on file  . Food insecurity    Worry: Not on file    Inability: Not on file  . Transportation needs    Medical: Not on file    Non-medical: Not on file  Tobacco Use  . Smoking status: Never Smoker  . Smokeless tobacco: Never Used  Substance and Sexual Activity  . Alcohol use: No  . Drug use: No  . Sexual activity: Yes    Birth control/protection: None  Lifestyle  . Physical activity    Days per week: Not on file    Minutes per session: Not on file  . Stress: Not on file  Relationships  . Social Musician on phone: Not on file    Gets together: Not on file    Attends religious service: Not on file    Active member of club or organization: Not on file    Attends meetings of clubs or organizations: Not on file    Relationship status: Not on file  Other Topics Concern  . Not on file  Social History Narrative  . Not on file  Additional Social History:                         Sleep: Good  Appetite:  Good  Current Medications: Current Facility-Administered Medications  Medication Dose Route Frequency Provider Last Rate Last Dose  . acetaminophen (TYLENOL) tablet 650 mg  650 mg Oral Q6H PRN Laveda AbbeParks, Laurie Britton, NP      . alum & mag hydroxide-simeth (MAALOX/MYLANTA) 200-200-20 MG/5ML suspension 30 mL  30 mL Oral Q4H PRN Laveda AbbeParks, Laurie Britton, NP      . amLODipine (NORVASC) tablet 10 mg  10 mg Oral Daily Laveda AbbeParks, Laurie Britton, NP   10 mg at 01/08/19 0954  . aspirin EC tablet 81 mg  81 mg Oral Daily Laveda AbbeParks, Laurie Britton, NP   81 mg at 01/08/19 [id number]  . benztropine (COGENTIN) tablet 1 mg  1 mg Oral BID Malvin JohnsFarah,  Savva Beamer, MD   1 mg at 01/08/19 0956  . glipiZIDE (GLUCOTROL XL) 24 hr tablet 10 mg  10 mg Oral Q breakfast Laveda AbbeParks, Laurie Britton, NP   10 mg at 01/08/19 0954  . haloperidol (HALDOL) tablet 15 mg  15 mg Oral QHS Malvin JohnsFarah, Abdur Hoglund, MD   15 mg at 01/07/19 2048  . haloperidol (HALDOL) tablet 5 mg  5 mg Oral Daily Malvin JohnsFarah, Sunni Richardson, MD   5 mg at 01/08/19 0954  . insulin aspart (novoLOG) injection 0-15 Units  0-15 Units Subcutaneous TID WC Laveda AbbeParks, Laurie Britton, NP   2 Units at 01/08/19 [phone] [id number]  . lisinopril (ZESTRIL) tablet 40 mg  40 mg Oral Daily Laveda AbbeParks, Laurie Britton, NP   40 mg at 01/08/19 0955  . lithium carbonate (ESKALITH) CR tablet 450 mg  450 mg Oral Q12H Malvin JohnsFarah, Tresha Muzio, MD   450 mg at 01/08/19 0955  . ziprasidone (GEODON) injection 20 mg  20 mg Intramuscular Q12H PRN Malvin JohnsFarah, Jayke Caul, MD       And  . LORazepam (ATIVAN) tablet 1 mg  1 mg Oral PRN Malvin JohnsFarah, Elden Brucato, MD      . magnesium hydroxide (MILK OF MAGNESIA) suspension 30 mL  30 mL Oral Daily PRN Laveda AbbeParks, Laurie Britton, NP      . metFORMIN (GLUCOPHAGE) tablet 1,000 mg  1,000 mg Oral BID WC Laveda AbbeParks, Laurie Britton, NP   1,000 mg at 01/08/19 0954  . temazepam (RESTORIL) capsule 30 mg  30 mg Oral QHS Malvin JohnsFarah, Zadyn Yardley, MD   30 mg at 01/07/19 2048    Lab Results:  Results for orders placed or performed during the hospital encounter of 01/04/19 (from the past 48 hour(s))  Glucose, capillary     Status: Abnormal   Collection Time: 01/06/19  4:57 PM  Result Value Ref Range   Glucose-Capillary 159 (H) 70 - 99 mg/dL  Glucose, capillary     Status: Abnormal   Collection Time: 01/06/19  7:48 PM  Result Value Ref Range   Glucose-Capillary 236 (H) 70 - 99 mg/dL  Glucose, capillary     Status: Abnormal   Collection Time: 01/07/19  5:40 AM  Result Value Ref Range   Glucose-Capillary 148 (H) 70 - 99 mg/dL  Glucose, capillary     Status: None   Collection Time: 01/07/19 11:50 AM  Result Value Ref Range   Glucose-Capillary 99 70 - 99 mg/dL  Glucose, capillary     Status:  Abnormal   Collection Time: 01/07/19  4:43 PM  Result Value Ref Range   Glucose-Capillary 232 (H) 70 - 99 mg/dL  TSH  Status: None   Collection Time: 01/07/19  6:06 PM  Result Value Ref Range   TSH 2.264 0.350 - 4.500 uIU/mL    Comment: Performed by a 3rd Generation assay with a functional sensitivity of <=0.01 uIU/mL. Performed at [facility], [place] [address], [place], [place] [address]   Hemoglobin A1c     Status: Abnormal   Collection Time: 01/07/19  6:06 PM  Result Value Ref Range   Hgb A1c MFr Bld 8.7 (H) 4.8 - 5.6 %    Comment: (NOTE) Pre diabetes:          5.7%-6.4% Diabetes:              >6.4% Glycemic control for   <7.0% adults with diabetes    Mean Plasma Glucose 202.99 mg/dL    Comment: Performed at [facility], [place] [address], [place], [place] [address]  Lithium level     Status: Abnormal   Collection Time: 01/07/19  6:06 PM  Result Value Ref Range   Lithium Lvl 0.35 (L) 0.60 - 1.20 mmol/L    Comment: Performed at [facility], [place] [address] [place], [place] [address]  Glucose, capillary     Status: Abnormal   Collection Time: 01/07/19  7:44 PM  Result Value Ref Range   Glucose-Capillary 142 (H) 70 - 99 mg/dL  Glucose, capillary     Status: Abnormal   Collection Time: 01/08/19  6:07 AM  Result Value Ref Range   Glucose-Capillary 146 (H) 70 - 99 mg/dL  Glucose, capillary     Status: Abnormal   Collection Time: 01/08/19 11:58 AM  Result Value Ref Range   Glucose-Capillary 114 (H) 70 - 99 mg/dL    Blood Alcohol level:  Lab Results  Component Value Date   ETH <10 01/03/2019   ETH <10 28/31/5176    Metabolic Disorder Labs: Lab Results  Component Value Date   HGBA1C 8.7 (H) 01/07/2019   MPG 202.99 01/07/2019   MPG 314.92 07/18/2018   No results found for: PROLACTIN Lab Results  Component Value Date   CHOL 151 07/18/2018   TRIG 122 07/18/2018   HDL 44 07/18/2018   CHOLHDL 3.4 07/18/2018   VLDL 24  07/18/2018   LDLCALC 83 07/18/2018   LDLCALC 70 01/27/2011    Musculoskeletal: Strength & Muscle Tone: within normal limits Gait & Station: normal Patient leans: N/A  Psychiatric Specialty Exam: Physical Exam  ROS  Blood pressure 130/78, pulse (!) 101, temperature 98.9 F (37.2 C), temperature source Oral, resp. rate 16.There is no height or weight on file to calculate BMI.  General Appearance: Casual  Eye Contact:  Good  Speech:  Clear and Coherent and Slow  Volume:  Decreased  Mood:  Dysphoric  Affect:  Congruent  Thought Process:  Goal Directed and Descriptions of Associations: Tangential  Orientation:  Full (Time, Place, and Person)  Thought Content:  Poverty of content of speech and thought  Suicidal Thoughts:  No  Homicidal Thoughts:  No  Memory:  Immediate;   Fair Recent;   Fair Remote;   Fair  Judgement:  Fair  Insight:  Fair  Psychomotor Activity:  Normal  Concentration:  Concentration: Fair and Attention Span: Fair  Recall:  [employer] of Knowledge:  Fair  Language:  Good  Akathisia:  Negative  Handed:  Right  AIMS (if indicated):     Assets:  Physical Health Resilience Social Support  ADL's:  Intact  Cognition:  WNL  Sleep:  Number of Hours: 2     Treatment Plan Summary: Daily contact with patient to assess and evaluate symptoms and progress in treatment  Continue current cognitive and reality-based therapies continue Haldol but staggered dose to be more sedating at bedtime no change in precautions only slept 2 hours but somewhat sleepy in the daytime discussed treatment plans and discharge planning with social work  Malvin Johns, MD 01/08/2019, 1:44 PM

## 2019-01-08 NOTE — Progress Notes (Signed)
Recreation Therapy Notes  Date: 10.12.20 Time: 1000 Location: 400 Hall Dayroom  Group Topic: Coping Skills  Goal Area(s) Addresses:  Patient will identify positive coping skills. Patient will identify benefits of using positive coping strategies. Patient will identify how coping skills can be beneficial post d/c.  Behavioral Response:  Minimal  Intervention:  Worksheet, pencils  Activity:  Building surveyor.  Patients were given a blank Building surveyor.  Inside the web, patients were to identify and label what things/issues have held them back or stuck.  Patient were to then identify at least two coping skills for each situation they identified and write it outside the web.  Education: Radiographer, therapeutic, Dentist.   Education Outcome: Acknowledges understanding/In group clarification offered/Needs additional education.   Clinical Observations/Feedback: Pt was actively talking to himself during group session.  Pt was bright and answered when engaged.  Pt wrote on his web but it was incoherent.    Victorino Sparrow, LRT/CTRS      Ria Comment, Lucyann Romano A 01/08/2019 11:15 AM

## 2019-01-09 LAB — GLUCOSE, CAPILLARY
Glucose-Capillary: 141 mg/dL — ABNORMAL HIGH (ref 70–99)
Glucose-Capillary: 309 mg/dL — ABNORMAL HIGH (ref 70–99)
Glucose-Capillary: 154 mg/dL — ABNORMAL HIGH (ref 70–99)
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
## 2019-01-09 NOTE — Progress Notes (Signed)
D: Pt denies SI/HI/AVH. Pt continues to be disorganized at times , talking to people not seen by staff. Pt visible on the milieu A: Pt was offered support and encouragement. Pt was given scheduled medications. Pt was encourage to attend groups. Q 15 minute checks were done for safety.  R:Pt attends groups and interacts well with peers and staff. Pt is taking medication. Pt has no complaints.Pt receptive to treatment and safety maintained on unit.

## 2019-01-09 NOTE — Progress Notes (Signed)
Recreation Therapy Notes  Date: 10.13.20 Time: 1000 Location: 400 Hall Dayroom  Group Topic: Self-Esteem  Goal Area(s) Addresses:  Patient will successfully identify positive attributes about themselves.  Patient will successfully identify benefit of improved self-esteem.   Behavioral Response: None  Intervention: Colored pencils, face outlines, music  Activity: How I See Me.  Patients were given a blank outline of a face. Using words and drawing, patients were to express how they see themselves and identify all positive characteristics.  Education:  Self-Esteem, Dentist.   Education Outcome: Acknowledges education/In group clarification offered/Needs additional education  Clinical Observations/Feedback: Pt did not complete activity but sat and listened to the music.    Victorino Sparrow, LRT/CTRS         Victorino Sparrow A 01/09/2019 11:54 AM

## 2019-01-09 NOTE — Progress Notes (Signed)
Patient stated that he had a "wonderful" day but did not rate it on a scale of one to ten. He states that he spent time with his peers and socialized. His goal for tomorrow is to be the best man he can be and to work on "standing my ground". He did not explain any further.

## 2019-01-10 LAB — GLUCOSE, CAPILLARY: Glucose-Capillary: 188 mg/dL — ABNORMAL HIGH (ref 70–99)

## 2019-01-10 MED ORDER — ASPIRIN 81 MG PO TBEC: 81.0000 mg | DELAYED_RELEASE_TABLET | Freq: Every day | ORAL | 12 refills | Status: DC

## 2019-01-10 MED ORDER — LITHIUM CARBONATE ER 450 MG PO TBCR: 450.0000 mg | EXTENDED_RELEASE_TABLET | Freq: Two times a day (BID) | ORAL | 2 refills | Status: DC

## 2019-01-10 MED ORDER — AMLODIPINE BESYLATE 10 MG PO TABS: 10.0000 mg | ORAL_TABLET | Freq: Every day | ORAL | 1 refills | Status: DC

## 2019-01-10 MED ORDER — HALOPERIDOL 20 MG PO TABS: 20.0000 mg | ORAL_TABLET | Freq: Every day | ORAL | 1 refills | Status: DC

## 2019-01-10 MED ORDER — METFORMIN HCL 1000 MG PO TABS: 1000.0000 mg | ORAL_TABLET | Freq: Two times a day (BID) | ORAL | 2 refills | Status: DC

## 2019-01-10 MED ORDER — GLIPIZIDE ER 10 MG PO TB24: 10.0000 mg | ORAL_TABLET | Freq: Every day | ORAL | 1 refills | Status: DC

## 2019-01-10 MED ORDER — LISINOPRIL 40 MG PO TABS: 40.0000 mg | ORAL_TABLET | Freq: Every day | ORAL | 1 refills | Status: DC

## 2019-01-10 MED ORDER — BENZTROPINE MESYLATE 1 MG PO TABS: 1.0000 mg | ORAL_TABLET | Freq: Two times a day (BID) | ORAL | 2 refills | Status: DC
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
## 2019-01-10 NOTE — Progress Notes (Signed)
Recreation Therapy Notes  INPATIENT RECREATION TR PLAN  Patient Details Name: Mitchell [name] MRN: [id number] DOB: Dec 23, 1960 Today's Date: 01/10/2019  Rec Therapy Plan Is patient appropriate for Therapeutic Recreation?: Yes Treatment times per week: about 3 days Estimated Length of Stay: 5-7 days TR Treatment/Interventions: Group participation (Comment)  Discharge Criteria Pt will be discharged from therapy if:: Discharged Treatment plan/goals/alternatives discussed and agreed upon by:: Patient/family  Discharge Summary Progress toward goals comments: Groups attended Which groups?: Wellness, Self-esteem, Coping skills, Goal setting Therapeutic equipment acquired: N/A Reason patient discharged from therapy: Discharge from hospital Pt/family agrees with progress & goals achieved: Yes Date patient discharged from therapy: 01/10/19    Victorino Sparrow, LRT/CTRS  Ria Comment, Twala Collings A 01/10/2019, 11:08 AM

## 2019-01-10 NOTE — Progress Notes (Signed)
  [facility] Adult Case Management Discharge Plan :  Will you be returning to the same living situation after discharge:  Yes,  home At discharge, do you have transportation home?: Yes,  [employer] Do you have the ability to pay for your medications: Yes,  medicare  Release of information consent forms completed and in the chart;  Patient's signature needed at discharge.  Patient to Follow up at: Follow-up Information    [facility] up.   Why: Beverly Sessions will call you at your number provided with your outpatient appointment. Please make sure to have your medication list on hand for your appointment.  Contact information: [address] [place], [place] [address]  P: [phone] F: [phone]          Next level of care provider has access to [place] and Suicide Prevention discussed: Yes,  with pt; pt declined     Has patient been referred to the Quitline?: N/A patient is not a smoker  Patient has been referred for addiction treatment: Yes  Trecia Rogers, LCSW 01/10/2019, 10:44 AM

## 2019-01-10 NOTE — [facility] Suicide Risk Assessment (Signed)
[facility] Discharge Suicide Risk Assessment   Principal Problem: psychosis Discharge Diagnoses: Active Problems:   Schizoaffective disorder ([place])   Total Time spent with patient: 45 minutes  Musculoskeletal: Strength & Muscle Tone: within normal limits Gait & Station: normal Patient leans: N/A  Psychiatric Specialty Exam: ROS  Blood pressure (!) 129/96, pulse 87, temperature (!) 97.5 F (36.4 C), resp. rate 16.There is no height or weight on file to calculate BMI.  General Appearance: Casual  Eye Contact::  Good  Speech:  Clear and Coherent409  Volume:  Decreased  Mood:  Euthymic  Affect:  Restricted  Thought Process:  Linear and Descriptions of Associations: Loose  Orientation:  Full (Time, Place, and Person)  Thought Content:  Make some disjointed statements but probably at baseline probably reflects low normal IQ versus borderline intellectual functioning.  No thoughts of harming self or others no acute auditory or visual hallucinations reported  Suicidal Thoughts:  No  Homicidal Thoughts:  No  Memory:  Immediate;   Poor Recent;   Poor Remote;   Fair  Judgement:  fair  Insight:  Fair  Psychomotor Activity:  Normal  Concentration:  Fair  Recall:  [employer] of Knowledge:Fair  Language: Fair  Akathisia:  Negative  Handed:  Right  AIMS (if indicated):     Assets:  Communication Skills Housing Leisure Time Physical Health  Sleep:  Number of Hours: 5  Cognition: WNL  ADL's:  Intact   Mental Status Per Nursing Assessment::   On Admission:  NA  Demographic Factors:  Male and Living alone  Loss Factors: Decrease in vocational status  Historical Factors: NA  Risk Reduction Factors:   Sense of responsibility to family and Religious beliefs about death  Continued Clinical Symptoms:  Previous Psychiatric Diagnoses and Treatments  Cognitive Features That Contribute To Risk:  Loss of executive function    Suicide Risk:  Minimal: No identifiable suicidal  ideation.  Patients presenting with no risk factors but with morbid ruminations; may be classified as minimal risk based on the severity of the depressive symptoms    Plan Of Care/Follow-up recommendations:  Activity:  full  Trevino Wyatt, MD 01/10/2019, 8:49 AM

## 2019-01-10 NOTE — Tx Team (Signed)
Interdisciplinary Treatment and Diagnostic Plan Update  01/10/2019 Time of Session: 10:15am Mitchell [name] MRN: [id number]  Principal Diagnosis: Schizoaffective disorder [facility])  Secondary Diagnoses: Principal Problem:   Schizoaffective disorder (HCC)   Current Medications:  Current Facility-Administered Medications  Medication Dose Route Frequency Provider Last Rate Last Dose  . acetaminophen (TYLENOL) tablet 650 mg  650 mg Oral Q6H PRN Laveda Abbe, NP      . alum & mag hydroxide-simeth (MAALOX/MYLANTA) 200-200-20 MG/5ML suspension 30 mL  30 mL Oral Q4H PRN Laveda Abbe, NP      . amLODipine (NORVASC) tablet 10 mg  10 mg Oral Daily Laveda Abbe, NP   10 mg at 01/10/19 0751  . aspirin EC tablet 81 mg  81 mg Oral Daily Laveda Abbe, NP   81 mg at 01/10/19 0751  . benztropine (COGENTIN) tablet 1 mg  1 mg Oral BID Malvin Johns, MD   1 mg at 01/10/19 0751  . glipiZIDE (GLUCOTROL XL) 24 hr tablet 10 mg  10 mg Oral Q breakfast Laveda Abbe, NP   10 mg at 01/10/19 0748  . haloperidol (HALDOL) tablet 15 mg  15 mg Oral QHS Malvin Johns, MD   15 mg at 01/09/19 2054  . haloperidol (HALDOL) tablet 5 mg  5 mg Oral Daily Malvin Johns, MD   5 mg at 01/10/19 0749  . insulin aspart (novoLOG) injection 0-15 Units  0-15 Units Subcutaneous TID WC Laveda Abbe, NP   3 Units at 01/10/19 [phone]  . lisinopril (ZESTRIL) tablet 40 mg  40 mg Oral Daily Laveda Abbe, NP   40 mg at 01/10/19 0749  . lithium carbonate (ESKALITH) CR tablet 450 mg  450 mg Oral Q12H Malvin Johns, MD   450 mg at 01/10/19 0751  . ziprasidone (GEODON) injection 20 mg  20 mg Intramuscular Q12H PRN Malvin Johns, MD   20 mg at 01/08/19 1650   And  . LORazepam (ATIVAN) tablet 1 mg  1 mg Oral PRN Malvin Johns, MD      . magnesium hydroxide (MILK OF MAGNESIA) suspension 30 mL  30 mL Oral Daily PRN Laveda Abbe, NP      . metFORMIN (GLUCOPHAGE) tablet 1,000 mg  1,000 mg Oral BID  WC Laveda Abbe, NP   1,000 mg at 01/10/19 0748  . temazepam (RESTORIL) capsule 30 mg  30 mg Oral QHS Malvin Johns, MD   30 mg at 01/09/19 2054   PTA Medications: Medications Prior to Admission  Medication Sig Dispense Refill Last Dose  . lithium carbonate (ESKALITH) 450 MG CR tablet Take 2 tablets (900 mg total) by mouth at bedtime. (Patient not taking: Reported on 11/27/2018) 60 tablet 1   . PRESCRIPTION MEDICATION Take 1 tablet by mouth daily. Blood pressure medication       Patient Stressors: Financial difficulties Marital or family conflict Medication change or noncompliance  Patient Strengths: Geographical information systems officer for treatment/growth  Treatment Modalities: Medication Management, Group therapy, Case management,  1 to 1 session with clinician, Psychoeducation, Recreational therapy.   Physician Treatment Plan for Primary Diagnosis: Schizoaffective disorder (HCC) Long Term Goal(s): Improvement in symptoms so as ready for discharge   Short Term Goals: Ability to demonstrate self-control will improve Ability to identify and develop effective coping behaviors will improve Ability to maintain clinical measurements within normal limits will improve Compliance with prescribed medications will improve  Medication Management: Evaluate patient's response, side effects, and tolerance of medication  regimen.  Therapeutic Interventions: 1 to 1 sessions, Unit Group sessions and Medication administration.  Evaluation of Outcomes: Adequate for Discharge  Physician Treatment Plan for Secondary Diagnosis: Principal Problem:   Schizoaffective disorder ([place])  Long Term Goal(s): Improvement in symptoms so as ready for discharge   Short Term Goals: Ability to demonstrate self-control will improve Ability to identify and develop effective coping behaviors will improve Ability to maintain clinical measurements within normal limits will improve Compliance with prescribed  medications will improve     Medication Management: Evaluate patient's response, side effects, and tolerance of medication regimen.  Therapeutic Interventions: 1 to 1 sessions, Unit Group sessions and Medication administration.  Evaluation of Outcomes: Adequate for Discharge   RN Treatment Plan for Primary Diagnosis: Schizoaffective disorder ([place]) Long Term Goal(s): Knowledge of disease and therapeutic regimen to maintain health will improve  Short Term Goals: Ability to participate in decision making will improve, Ability to verbalize feelings will improve, Ability to disclose and discuss suicidal ideas, Ability to identify and develop effective coping behaviors will improve and Compliance with prescribed medications will improve  Medication Management: RN will administer medications as ordered by provider, will assess and evaluate patient's response and provide education to patient for prescribed medication. RN will report any adverse and/or side effects to prescribing provider.  Therapeutic Interventions: 1 on 1 counseling sessions, Psychoeducation, Medication administration, Evaluate responses to treatment, Monitor vital signs and CBGs as ordered, Perform/monitor CIWA, COWS, AIMS and Fall Risk screenings as ordered, Perform wound care treatments as ordered.  Evaluation of Outcomes: Adequate for Discharge   LCSW Treatment Plan for Primary Diagnosis: Schizoaffective disorder [facility]) Long Term Goal(s): Safe transition to appropriate next level of care at discharge, Engage patient in therapeutic group addressing interpersonal concerns.  Short Term Goals: Engage patient in aftercare planning with referrals and resources and Increase skills for wellness and recovery  Therapeutic Interventions: Assess for all discharge needs, 1 to 1 time with Social worker, Explore available resources and support systems, Assess for adequacy in community support network, Educate family and significant other(s)  on suicide prevention, Complete Psychosocial Assessment, Interpersonal group therapy.  Evaluation of Outcomes: Adequate for Discharge   Progress in Treatment: Attending groups: Yes. Participating in groups: Yes. Taking medication as prescribed: Yes. Toleration medication: Yes. Family/Significant other contact made: Yes, individual(s) contacted:  with pt; pt declined SPE Patient understands diagnosis: No. Discussing patient identified problems/goals with staff: Yes. Medical problems stabilized or resolved: Yes. Denies suicidal/homicidal ideation: Yes. Issues/concerns per patient self-inventory: No. Other:   New problem(s) identified: No, Describe:  None  New Short Term/Long Term Goal(s): Medication stabilization, elimination of SI thoughts, and development of a comprehensive mental wellness plan.   Patient Goals:  "Spend time with my family"  Discharge Plan or Barriers: Patient will be discharging home to his apartment and following up with [facility] for medication management and therapy.   Reason for Continuation of Hospitalization: Patient is discharging today.   Estimated Length of Stay: Patient is discharging today.   Attendees: Patient: 01/10/2019   Physician: Dr. Johnn Hai, MD 01/10/2019  Nursing: Vladimir Faster, RN 01/10/2019   RN Care Manager: 01/10/2019   Social Worker: Ardelle Anton, LCSW  01/10/2019   Recreational Therapist:  01/10/2019   Other:  01/10/2019  Other:  01/10/2019   Other: 01/10/2019      Scribe for Treatment Team: Trecia Rogers, LCSW 01/10/2019 11:10 AM

## 2019-01-10 NOTE — BHH Suicide Risk Assessment (Signed)
[place] INPATIENT:  Family/Significant Other Suicide Prevention Education  Suicide Prevention Education:  Patient Refusal for Family/Significant Other Suicide Prevention Education: The patient Mitchell [name] has refused to provide written consent for family/significant other to be provided Family/Significant Other Suicide Prevention Education during admission and/or prior to discharge.  Physician notified.  Trecia Rogers 01/10/2019, 9:30 AM

## 2019-01-10 NOTE — Progress Notes (Signed)
Recreation Therapy Notes  Date: 10.14.20 Time: 1000 Location: 500 Hall Dayroom  Group Topic: Wellness  Goal Area(s) Addresses:  Patient will define components of whole wellness. Patient will verbalize benefit of whole wellness.  Behavioral Response:  None  Intervention: Music     Activity:  Exercise. LRT and patients completed [name] workout routine created by the patients.  The routine consisted of squats, push ups, leg raises, side crunches, v-ups and planks.  Education: Wellness, Dentist.   Education Outcome: Acknowledges education/In group clarification offered/Needs additional education.   Clinical Observations/Feedback:  Pt sat smiling and talking to someone not present.  Pt was pleasant during group.    Mitchell [name], LRT/CTRS         Mitchell [name], Mitchell [name] 01/10/2019 10:57 AM

## 2019-01-18 ENCOUNTER — Other Ambulatory Visit: Payer: Self-pay

## 2019-01-18 ENCOUNTER — Emergency Department (HOSPITAL_COMMUNITY)
Admission: EM | Admit: 2019-01-18 | Discharge: 2019-01-23 | Disposition: A | Discharge status: Other Acute Inpatient Hospital | Payer: Medicare Other | Attending: Emergency Medicine | Admitting: Emergency Medicine

## 2019-01-18 ENCOUNTER — Encounter (HOSPITAL_COMMUNITY): Payer: Self-pay | Admitting: Emergency Medicine

## 2019-01-18 DIAGNOSIS — Z7982 Long term (current) use of aspirin: Secondary | ICD-10-CM | POA: Insufficient documentation

## 2019-01-18 DIAGNOSIS — I69354 Hemiplegia and hemiparesis following cerebral infarction affecting left non-dominant side: Secondary | ICD-10-CM | POA: Diagnosis not present

## 2019-01-18 DIAGNOSIS — Z20828 Contact with and (suspected) exposure to other viral communicable diseases: Secondary | ICD-10-CM | POA: Diagnosis not present

## 2019-01-18 DIAGNOSIS — I10 Essential (primary) hypertension: Secondary | ICD-10-CM | POA: Diagnosis not present

## 2019-01-18 DIAGNOSIS — R739 Hyperglycemia, unspecified: Secondary | ICD-10-CM

## 2019-01-18 DIAGNOSIS — R45851 Suicidal ideations: Secondary | ICD-10-CM | POA: Diagnosis not present

## 2019-01-18 DIAGNOSIS — Z79899 Other long term (current) drug therapy: Secondary | ICD-10-CM | POA: Insufficient documentation

## 2019-01-18 DIAGNOSIS — F29 Unspecified psychosis not due to a substance or known physiological condition: Secondary | ICD-10-CM | POA: Diagnosis present

## 2019-01-18 DIAGNOSIS — Z9114 Patient's other noncompliance with medication regimen: Secondary | ICD-10-CM | POA: Insufficient documentation

## 2019-01-18 DIAGNOSIS — F329 Major depressive disorder, single episode, unspecified: Secondary | ICD-10-CM | POA: Insufficient documentation

## 2019-01-18 DIAGNOSIS — Z23 Encounter for immunization: Secondary | ICD-10-CM | POA: Diagnosis not present

## 2019-01-18 DIAGNOSIS — Z7984 Long term (current) use of oral hypoglycemic drugs: Secondary | ICD-10-CM | POA: Insufficient documentation

## 2019-01-18 DIAGNOSIS — D649 Anemia, unspecified: Secondary | ICD-10-CM

## 2019-01-18 DIAGNOSIS — E1165 Type 2 diabetes mellitus with hyperglycemia: Secondary | ICD-10-CM | POA: Insufficient documentation

## 2019-01-18 DIAGNOSIS — F32A Depression, unspecified: Secondary | ICD-10-CM

## 2019-01-18 DIAGNOSIS — F250 Schizoaffective disorder, bipolar type: Secondary | ICD-10-CM | POA: Diagnosis present

## 2019-01-18 LAB — CBC
HCT: 34.2 % — ABNORMAL LOW (ref 39.0–52.0)
Hemoglobin: 11.8 g/dL — ABNORMAL LOW (ref 13.0–17.0)
MCH: 32.2 pg (ref 26.0–34.0)
MCHC: 34.5 g/dL (ref 30.0–36.0)
MCV: 93.4 fL (ref 80.0–100.0)
Platelets: 238 10*3/uL (ref 150–400)
RBC: 3.66 MIL/uL — ABNORMAL LOW (ref 4.22–5.81)
RDW: 11.9 % (ref 11.5–15.5)
WBC: 5.7 10*3/uL (ref 4.0–10.5)
nRBC: 0 % (ref 0.0–0.2)

## 2019-01-18 LAB — COMPREHENSIVE METABOLIC PANEL
ALT: 33 U/L (ref 0–44)
AST: 33 U/L (ref 15–41)
Albumin: 3.3 g/dL — ABNORMAL LOW (ref 3.5–5.0)
Alkaline Phosphatase: 139 U/L — ABNORMAL HIGH (ref 38–126)
Anion gap: 9 (ref 5–15)
BUN: 19 mg/dL (ref 6–20)
CO2: 23 mmol/L (ref 22–32)
Calcium: 8.4 mg/dL — ABNORMAL LOW (ref 8.9–10.3)
Chloride: 98 mmol/L (ref 98–111)
Creatinine, Ser: 1.03 mg/dL (ref 0.61–1.24)
GFR calc Af Amer: >60 mL/min (ref >60)
GFR calc non Af Amer: >60 mL/min (ref >60)
Glucose, Bld: 439 mg/dL — ABNORMAL HIGH (ref 70–99)
Potassium: 3.5 mmol/L (ref 3.5–5.1)
Sodium: 130 mmol/L — ABNORMAL LOW (ref 135–145)
Total Bilirubin: 0.6 mg/dL (ref 0.3–1.2)
Total Protein: 6.3 g/dL — ABNORMAL LOW (ref 6.5–8.1)

## 2019-01-18 LAB — RAPID URINE DRUG SCREEN, HOSP PERFORMED
Amphetamines: NOT DETECTED
Barbiturates: NOT DETECTED
Benzodiazepines: NOT DETECTED
Cocaine: NOT DETECTED
Opiates: NOT DETECTED
Tetrahydrocannabinol: NOT DETECTED

## 2019-01-18 NOTE — BHH Counselor (Signed)
TTS spoke with nurse secretary Magda Paganini, pt does not have a room, will find him and set up cart in 15 min. TTS will call to do assessment in 15 min.
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
## 2019-01-18 NOTE — ED Triage Notes (Signed)
Pt presents via [employer] for suicidal ideations that have been going on for some time now. Pt reports he is down and out on his luck with things not going well for him. Pt reports things are being shut off in his apartment due to no money coming in.

## 2019-01-19 DIAGNOSIS — F329 Major depressive disorder, single episode, unspecified: Secondary | ICD-10-CM | POA: Diagnosis not present

## 2019-01-19 LAB — CBG MONITORING, ED: Glucose-Capillary: 250 mg/dL — ABNORMAL HIGH (ref 70–99)

## 2019-01-19 LAB — SALICYLATE LEVEL: Salicylate Lvl: <7 mg/dL (ref 2.8–30.0)

## 2019-01-19 LAB — ACETAMINOPHEN LEVEL: Acetaminophen (Tylenol), Serum: <10 ug/mL — ABNORMAL LOW (ref 10–30)

## 2019-01-19 LAB — ETHANOL: Alcohol, Ethyl (B): <10 mg/dL (ref <10)

## 2019-01-19 LAB — SARS CORONAVIRUS 2 BY RT PCR (HOSPITAL ORDER, PERFORMED IN ~~LOC~~ HOSPITAL LAB): SARS Coronavirus 2: NEGATIVE

## 2019-01-19 LAB — POC OCCULT BLOOD, ED: Fecal Occult Bld: NEGATIVE

## 2019-01-19 MED ORDER — BENZTROPINE MESYLATE 1 MG PO TABS
1.0000 mg | ORAL_TABLET | Freq: Two times a day (BID) | ORAL | Status: DC
  Administered 2019-01-19 – 2019-01-23 (×10): 1 mg via ORAL
  Filled 2019-01-19 (×10): qty 1

## 2019-01-19 MED ORDER — GLIPIZIDE ER 10 MG PO TB24
10.0000 mg | ORAL_TABLET | Freq: Every day | ORAL | Status: DC
  Administered 2019-01-19 – 2019-01-23 (×5): 10 mg via ORAL
  Filled 2019-01-19 (×7): qty 1

## 2019-01-19 MED ORDER — AMLODIPINE BESYLATE 5 MG PO TABS
10.0000 mg | ORAL_TABLET | Freq: Every day | ORAL | Status: DC
  Administered 2019-01-19 – 2019-01-23 (×4): 10 mg via ORAL
  Filled 2019-01-19 (×5): qty 2

## 2019-01-19 MED ORDER — LITHIUM CARBONATE ER 450 MG PO TBCR
450.0000 mg | EXTENDED_RELEASE_TABLET | Freq: Two times a day (BID) | ORAL | Status: DC
  Administered 2019-01-19 – 2019-01-23 (×10): 450 mg via ORAL
  Filled 2019-01-19 (×10): qty 1

## 2019-01-19 MED ORDER — HALOPERIDOL 5 MG PO TABS
20.0000 mg | ORAL_TABLET | Freq: Every day | ORAL | Status: DC
  Administered 2019-01-19 – 2019-01-22 (×5): 20 mg via ORAL
  Filled 2019-01-19 (×5): qty 4

## 2019-01-19 MED ORDER — ASPIRIN EC 81 MG PO TBEC
81.0000 mg | DELAYED_RELEASE_TABLET | Freq: Every day | ORAL | Status: DC
  Administered 2019-01-19 – 2019-01-23 (×5): 81 mg via ORAL
  Filled 2019-01-19 (×6): qty 1

## 2019-01-19 MED ORDER — TETANUS-DIPHTH-ACELL PERTUSSIS 5-2.5-18.5 LF-MCG/0.5 IM SUSP
0.5000 mL | Freq: Once | INTRAMUSCULAR | Status: AC
  Administered 2019-01-19: 04:00:00 0.5 mL via INTRAMUSCULAR
  Filled 2019-01-19: qty 0.5

## 2019-01-19 MED ORDER — LISINOPRIL 20 MG PO TABS
40.0000 mg | ORAL_TABLET | Freq: Every day | ORAL | Status: DC
  Administered 2019-01-19 – 2019-01-23 (×5): 40 mg via ORAL
  Filled 2019-01-19 (×5): qty 2

## 2019-01-19 MED ORDER — METFORMIN HCL 500 MG PO TABS
1000.0000 mg | ORAL_TABLET | Freq: Two times a day (BID) | ORAL | Status: DC
  Administered 2019-01-19 – 2019-01-23 (×9): 1000 mg via ORAL
  Filled 2019-01-19 (×9): qty 2

## 2019-01-19 NOTE — ED Provider Notes (Signed)
[name] EMERGENCY DEPARTMENT Provider Note   CSN: [id number] Arrival date & time: 01/18/19  1441     History   Chief Complaint Chief Complaint  Patient presents with  . Suicidal    HPI Mitchell [name] is a 58 y.o. male with a hx of bipolar disorder, diabetes, hypertension, hyponatremia, schizoaffective disorder, stroke presents to the Emergency Department complaining of gradual, persistent, progressively worsening suicidal ideation.  Patient reports that he is having trouble living on his own and stating that his water is turned off.  He reports he has not had any food to eat and has been rummaging through the garbage to try to find things.  Patient reports taking some of his medications but not all of them and states he thinks he has been off his medications for a week.  Patient reports he became fed up today and felt as if he wanted to die.  He reports he used a knife to cut his right wrist.  Unknown last tetanus shot.  He denies auditory or visual hallucinations, homicidal ideations.  Patient reports feeling anxious and states he does not have family support.  Patient reports he cannot go home because he cannot care for himself there.  History is difficult to obtain as patient is agitated with tangential discussions.  Records reviewed.  Patient was brought to the emergency department on 01/03/2019 for similar issues including problems with his apartment landlord and noncompliance with his medications.  At that time he arrived under IVC.  No IVC today.     The history is provided by the patient and medical records. No language interpreter was used.    Past Medical History:  Diagnosis Date  . Bipolar affective disorder (HCC)    takes Zyprexa daily  . Diabetes mellitus    takes Victoza,Metformin,and Glipizide daily  . Hypertension    takes Amlodipine,Lisinopril and Clonidine daily  . Hyponatremia    history of  . Mental disorder    takes Lithium daily  .  Schizoaffective disorder    takes Trazodone nightly  . Seasonal allergies    takes Claritin daily  . Sleep apnea    sleep study >77yrs ago  . Stroke [facility])    left arm weakness    Patient Active Problem List   Diagnosis Date Noted  . Schizoaffective disorder (HCC) 01/04/2019  . Schizophrenia (HCC) 07/18/2018  . 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 Coarse tremors 10/02/2014  . Hypertension   . Type 2 diabetes mellitus (HCC)   . Sleep apnea   . Hyponatremia 03/13/2011  . Schizoaffective disorder, bipolar type (HCC) 02/28/2011    Past Surgical History:  Procedure Laterality Date  . CATARACT EXTRACTION W/PHACO Right 02/14/2013   Procedure: CATARACT EXTRACTION PHACO AND INTRAOCULAR LENS PLACEMENT (IOC);  Surgeon: Shade Flood, MD;  Location: [facility] OR;  Service: Ophthalmology;  Laterality: Right;  . CATARACT EXTRACTION W/PHACO Left 06/13/2013   Procedure: CATARACT EXTRACTION PHACO AND INTRAOCULAR LENS PLACEMENT (IOC);  Surgeon: Shade Flood, MD;  Location: [facility] OR;  Service: Ophthalmology;  Laterality: Left;  . CIRCUMCISION  20 yrs. ago  . EYE SURGERY          Home Medications    Prior to Admission medications   Medication Sig Start Date End Date Taking? Authorizing Provider  amLODipine (NORVASC) 10 MG tablet Take 1 tablet (10 mg total) by mouth daily. 01/11/19   Malvin Johns, MD  aspirin 81 MG EC tablet Take 1 tablet (81 mg total) by mouth daily.  Swallow whole. 01/11/19   Malvin JohnsFarah, Brian, MD  benztropine (COGENTIN) 1 MG tablet Take 1 tablet (1 mg total) by mouth 2 (two) times daily. 01/10/19   Malvin JohnsFarah, Brian, MD  glipiZIDE (GLUCOTROL XL) 10 MG 24 hr tablet Take 1 tablet (10 mg total) by mouth daily with breakfast. 01/11/19   Malvin JohnsFarah, Brian, MD  haloperidol (HALDOL) 20 MG tablet Take 1 tablet (20 mg total) by mouth at bedtime. 01/10/19   Malvin JohnsFarah, Brian, MD  lisinopril (ZESTRIL) 40 MG tablet Take 1 tablet (40 mg total) by mouth daily. 01/11/19   Malvin JohnsFarah, Brian, MD  lithium carbonate (ESKALITH) 450 MG  CR tablet Take 1 tablet (450 mg total) by mouth every 12 (twelve) hours. 01/10/19   Malvin JohnsFarah, Brian, MD  metFORMIN (GLUCOPHAGE) 1000 MG tablet Take 1 tablet (1,000 mg total) by mouth 2 (two) times daily with a meal. 01/10/19   Malvin JohnsFarah, Brian, MD    Family History No family history on file.  Social History Social History   Tobacco Use  . Smoking status: Never Smoker  . Smokeless tobacco: Never Used  Substance Use Topics  . Alcohol use: No  . Drug use: No     Allergies   Patient has no known allergies.   Review of Systems Review of Systems  Constitutional: Negative for appetite change, diaphoresis, fatigue, fever and unexpected weight change.  HENT: Negative for mouth sores.   Eyes: Negative for visual disturbance.  Respiratory: Negative for cough, chest tightness, shortness of breath and wheezing.   Cardiovascular: Negative for chest pain.  Gastrointestinal: Negative for abdominal pain, constipation, diarrhea, nausea and vomiting.  Endocrine: Negative for polydipsia, polyphagia and polyuria.  Genitourinary: Negative for dysuria, frequency, hematuria and urgency.  Musculoskeletal: Negative for back pain and neck stiffness.  Skin: Positive for wound. Negative for rash.  Allergic/Immunologic: Negative for immunocompromised state.  Neurological: Negative for syncope, light-headedness and headaches.  Hematological: Does not bruise/bleed easily.  Psychiatric/Behavioral: Positive for suicidal ideas. Negative for hallucinations and sleep disturbance. The patient is nervous/anxious.      Physical Exam Updated Vital Signs BP (!) 128/103 (BP Location: Left Arm)   Pulse 83   Temp 98.7 F (37.1 C) (Oral)   Resp 18   Ht 5\' 6"  (1.676 m)   Wt 99.8 kg   SpO2 100%   BMI 35.51 kg/m   Physical Exam Vitals signs and nursing note reviewed.  Constitutional:      General: He is not in acute distress.    Appearance: He is not diaphoretic.  HENT:     Head: Normocephalic.  Eyes:      General: No scleral icterus.    Conjunctiva/sclera: Conjunctivae normal.  Neck:     Musculoskeletal: Normal range of motion.  Cardiovascular:     Rate and Rhythm: Normal rate and regular rhythm.     Pulses: Normal pulses.          Radial pulses are 2+ on the right side and 2+ on the left side.  Pulmonary:     Effort: No tachypnea, accessory muscle usage, prolonged expiration, respiratory distress or retractions.     Breath sounds: No stridor.     Comments: Equal chest rise. No increased work of breathing. Abdominal:     General: There is no distension.     Palpations: Abdomen is soft.     Tenderness: There is no abdominal tenderness. There is no guarding or rebound.  Musculoskeletal:     Right lower leg: No edema.  Left lower leg: No edema.     Comments: Moves all extremities equally and without difficulty.  Skin:    General: Skin is warm and dry.     Capillary Refill: Capillary refill takes less than 2 seconds.     Comments: Very superficial laceration to the dorsum of the right wrist.  Patient reports this is where he tried to kill himself today.  Hemostasis achieved.  No sutures required.  Neurological:     Mental Status: He is alert.     GCS: GCS eye subscore is 4. GCS verbal subscore is 5. GCS motor subscore is 6.     Comments: Speech is clear and goal oriented.  Psychiatric:        Attention and Perception: He does not perceive auditory or visual hallucinations.        Mood and Affect: Mood is anxious. Affect is angry.        Speech: Speech is rapid and pressured.        Behavior: Behavior is agitated.        Thought Content: Thought content includes suicidal ideation. Thought content does not include homicidal ideation. Thought content does not include homicidal plan.      ED Treatments / Results  Labs (all labs ordered are listed, but only abnormal results are displayed) Labs Reviewed  COMPREHENSIVE METABOLIC PANEL - Abnormal; Notable for the following  components:      Result Value   Sodium 130 (*)    Glucose, Bld 439 (*)    Calcium 8.4 (*)    Total Protein 6.3 (*)    Albumin 3.3 (*)    Alkaline Phosphatase 139 (*)    All other components within normal limits  ACETAMINOPHEN LEVEL - Abnormal; Notable for the following components:   Acetaminophen (Tylenol), Serum <10 (*)    All other components within normal limits  CBC - Abnormal; Notable for the following components:   RBC 3.66 (*)    Hemoglobin 11.8 (*)    HCT 34.2 (*)    All other components within normal limits  CBG MONITORING, ED - Abnormal; Notable for the following components:   Glucose-Capillary 250 (*)    All other components within normal limits  SARS CORONAVIRUS 2 BY RT PCR (HOSPITAL ORDER, [place] [facility])  ETHANOL  SALICYLATE LEVEL  RAPID URINE DRUG SCREEN, HOSP PERFORMED  URINALYSIS, ROUTINE W REFLEX MICROSCOPIC  POC OCCULT BLOOD, ED    Procedures Procedures (including critical care time)  Medications Ordered in ED Medications  amLODipine (NORVASC) tablet 10 mg (has no administration in time range)  aspirin EC tablet 81 mg (has no administration in time range)  benztropine (COGENTIN) tablet 1 mg (has no administration in time range)  glipiZIDE (GLUCOTROL XL) 24 hr tablet 10 mg (has no administration in time range)  haloperidol (HALDOL) tablet 20 mg (has no administration in time range)  lisinopril (ZESTRIL) tablet 40 mg (has no administration in time range)  lithium carbonate (ESKALITH) CR tablet 450 mg (has no administration in time range)  metFORMIN (GLUCOPHAGE) tablet 1,000 mg (has no administration in time range)  Tdap (BOOSTRIX) injection 0.5 mL (has no administration in time range)     Initial Impression / Assessment and Plan / ED Course  I have reviewed the triage vital signs and the nursing notes.  Pertinent labs & imaging results that were available during my care of the patient were reviewed by me and considered in my  medical decision making (see  chart for details).  Clinical Course as of Jan 19 328  Fri Jan 19, 2019  0005 Disposition:  Per Lelon Perla, NP pt will need to be reassessed  for psych evaluation in the morning. TTS confirmed status with Dr. Rhunette Croft.   [HM]  3009 Hyponatremia noted however corrected sodium given hyperglycemia is 138.    Sodium(!): 130 [HM]  0027 Noted to be lower than patient's previous at 13.1 2 weeks ago. Pt adamantly denies hematuria or BRBPR, but does state some black stools in the last week but has not seen any in the last several days.  Hemoglobin(!): 11.8 [HM]  0027 Patient is hyperglycemic but has not had his medications today.  Will give home medications.  Glucose(!): 439 [HM]  0030 HTN on arrival  BP(!): 162/77 [HM]  0249 Significantly improved  Glucose-Capillary(!): 250 [HM]  0249 Within normal limits  Anion gap: 9 [HM]  0302 Brown stool on DRE.  No BRBPR.    Fecal Occult Blood, POC: NEGATIVE [HM]    Clinical Course User Index [HM] Zakiyah Diop, Dahlia Client, PA-C        Patient here with suicidal ideation and reports of suicide attempt today.  He does have a very small laceration, more of a scratch to the dorsum of his right wrist.  Hemostasis achieved.  Tetanus updated.  Patient reports he has not been taking his medications.  He is hyperglycemic today with a blood sugar of 439.  Additionally he is hypertensive.  Mild hyponatremia is noted however suspect this may be due to both hyperglycemia and decreased p.o. intake.  Of note, patient does have a new anemia.  He is well-appearing, abdomen is soft and nontender.  Patient reports black stools in the last week but not in the last several days.  DRE with brown stool.  No black tarry stool or bright red blood per rectum.  No evidence that patient has an active GI bleed.  He will need repeat blood work within 1 week for reassessment of his anemia and he will need to return immediately to the emergency department for  further evaluation if bleeding returns.  Patient is not anticoagulated.  Patient is most concerned about caring for himself, stating he cannot go back to his apartment.  Patient will need social work assessment in addition to TTS who will reevaluate in the morning.  3:28 AM Blood sugar has improved without intervention.  At this time there is no emergent condition which requires intervention.  Patient will be reassessed by psych in the a.m. and will have a social work consult before discharge.  Final Clinical Impressions(s) / ED Diagnoses   Final diagnoses:  Depression, unspecified depression type  Anemia, unspecified type  Hyperglycemia  Noncompliance with medication regimen    ED Discharge Orders    None       Milta Deiters 01/19/19 0329    Ward, Layla Maw, DO 01/19/19 [phone]

## 2019-01-19 NOTE — Progress Notes (Signed)
Pt meets inpatient criteria per Jefferson Fuel, NP. Referral information has been sent to the following hospitals for review:  [place]       Disposition will continue to follow.   Audree Camel, LCSW, [place] Disposition [facility]/TTS [phone]
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
## 2019-01-19 NOTE — ED Notes (Signed)
Diet was ordered for Lunch. 

## 2019-01-20 DIAGNOSIS — F329 Major depressive disorder, single episode, unspecified: Secondary | ICD-10-CM | POA: Diagnosis not present

## 2019-01-20 NOTE — ED Notes (Signed)
Pt has been turning on sink in room and letting water run repeatedly. Pt then ambulates to bathroom and does the same thing.

## 2019-01-20 NOTE — ED Notes (Signed)
Ordered bfast 

## 2019-01-20 NOTE — ED Notes (Signed)
Pt noted to be talking to himself - denies AH.

## 2019-01-20 NOTE — ED Notes (Signed)
Breakfast tray ordered 
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
## 2019-01-20 NOTE — Progress Notes (Signed)
This patient continues to meet inpatient criteria. CSW faxed information to the following facilities:   [facility] at capacity (10/24)  [facility] at capacity, no appropriate bed availability at this time.   Stephanie Acre, [place] Social Worker [phone]

## 2019-01-20 NOTE — ED Notes (Signed)
Pt in shower. Pt noted w/change, pictures, and paperwork in pillowcase - staff removed from pillowcase and placed in pt's belongings bag in locker.

## 2019-01-20 NOTE — ED Notes (Signed)
Re-TTS completed.  

## 2019-01-20 NOTE — ED Notes (Signed)
Lunch tray ordered 

## 2019-01-20 NOTE — ED Notes (Signed)
Pt noted to be talking to himself.

## 2019-01-21 DIAGNOSIS — F329 Major depressive disorder, single episode, unspecified: Secondary | ICD-10-CM | POA: Diagnosis not present

## 2019-01-21 NOTE — ED Notes (Signed)
Sitter arrived at bedside. Pt given coffee as requested (decaf).
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
## 2019-01-21 NOTE — ED Notes (Signed)
Pt showered

## 2019-01-21 NOTE — BHH Counselor (Signed)
Re-assessment:   Per Wells Guiles, RN, patient is not appropriate for tele-psych at this time, per report and note "Pt noted to be pacing in room - restless. Talking to himself constantly - becoming louder at times. Pt pulled Code Blue button even after encouraged not to do so. 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 Pt continuously turning on faucet in room."   Ricky Ala, NP, patient cont to be recommended for inpt tx

## 2019-01-21 NOTE — ED Notes (Signed)
Pt noted to be talking loudly to himself and answers himself. Pt then lowers voice. Pt continues to be restless - ambulates back and forth to bathroom, turning on water, flushing toilet then turns water on to sink in room.

## 2019-01-21 NOTE — ED Notes (Signed)
Charge RN and Staffing Office aware of no Sitter available for pt. Pt being monitored by staff. 

## 2019-01-21 NOTE — ED Notes (Addendum)
Pt noted to be pacing in room - restless. Talking to himself constantly - becoming louder at times. Pt pulled Code Blue button even after encouraged not to do so. 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 Pt continuously turning on faucet in room. Message sent to [facility] NP.

## 2019-01-21 NOTE — ED Notes (Signed)
Sitting on side of bed moving his hands/fingers as if he is playing a piano. Pt asking for breakfast x 3. Advised pt it has been ordered.

## 2019-01-21 NOTE — ED Notes (Signed)
Breakfast tray has not been delivered. Pt given Sprite Zero and sandwich.

## 2019-01-22 DIAGNOSIS — F329 Major depressive disorder, single episode, unspecified: Secondary | ICD-10-CM | POA: Diagnosis not present

## 2019-01-22 MED ORDER — LORAZEPAM 1 MG PO TABS
1.0000 mg | ORAL_TABLET | Freq: Once | ORAL | Status: AC
  Administered 2019-01-22: 14:00:00 1 mg via ORAL
  Filled 2019-01-22: qty 1

## 2019-01-22 NOTE — ED Notes (Addendum)
[employer] is present to serve IVC papers; IVC has been faxed to [facility] and copy placed in Red folder and medical records; 3 copies on chart; -Monique,RN

## 2019-01-22 NOTE — ED Provider Notes (Signed)
5:43 PM Nursing informed this examiner that the patient has been accepted to outside facility and as he has become psychotic, he is now not stable for involuntary admission and will need to be IVC for the request of the psychiatry team.  I went to assess the patient and he is reporting current suicidal ideation.  Nursing reported that the patient tried to "mess with the electrical outlet" and they were concerned he was trying to hurt himself.  Patient reports that he has tried hurting self in the past.  Patient also has been pacing around the room, saying that he was "[name]", and was acting more erratic.   Pt does appear to be a danger to himself given then SI and outlet episode. Pt placed under IVC to ensure safety.    PT examined and had clear lungs and no physical complaints.    Autry Droege, Gwenyth Allegra, MD 01/22/19 1745

## 2019-01-22 NOTE — ED Notes (Signed)
Pt is agitated that the trash can has to stay outside his door, orders for ativan discussed with EDPA

## 2019-01-22 NOTE — Progress Notes (Signed)
Pt continues to meet inpatient criteria. Referral information has been refaxed to the following hospitals for review:  [facility] Details [facility] Details [facility] Details [facility] Details [facility] Details [facility] Details [facility] Details [facility] Details [facility] Details [place] Details [facility] Details [facility] Details [facility]  Disposition will continue to follow.   Audree Camel, LCSW, [place] Disposition [facility] [phone]

## 2019-01-22 NOTE — ED Notes (Signed)
Pt observed to be poking at electrical outlets with hands, when asked what he was doing said "I'm just playing" redirected away from electrical outlets.

## 2019-01-22 NOTE — ED Notes (Signed)
EDP completed IVC paperwork, papers given to secretary to be faxed to [facility]

## 2019-01-22 NOTE — Progress Notes (Signed)
Pt accepted to [facility], main campus.  Dr. Jonelle Sports is the accepting/attending provider Call report to [phone] [facility] @ [facility] ED notified.    Pt is currently voluntary but will be placed under IVC prior to being transported due to psychosis. He will be transported by Event organiser.  Pt may arrive to [facility] as soon as transportation is arranged.   Audree Camel, LCSW, [place] Disposition [facility]/TTS [phone]

## 2019-01-23 DIAGNOSIS — F329 Major depressive disorder, single episode, unspecified: Secondary | ICD-10-CM | POA: Diagnosis not present

## 2019-01-23 NOTE — ED Notes (Signed)
ALL belongings - 2 labeled belongings bags, 2 labeled back packs, and 1 valuables envelope - Deputy - Pt aware. Deputy transporting pt to [facility].

## 2019-02-10 ENCOUNTER — Other Ambulatory Visit: Payer: Self-pay

## 2019-02-10 ENCOUNTER — Encounter (HOSPITAL_COMMUNITY): Payer: Self-pay

## 2019-02-10 ENCOUNTER — Emergency Department (HOSPITAL_COMMUNITY)
Admission: EM | Admit: 2019-02-10 | Discharge: 2019-02-10 | Disposition: A | Discharge status: Home / Self Care | Payer: Medicare Other | Attending: Emergency Medicine | Admitting: Emergency Medicine

## 2019-02-10 DIAGNOSIS — Z20828 Contact with and (suspected) exposure to other viral communicable diseases: Secondary | ICD-10-CM | POA: Insufficient documentation

## 2019-02-10 DIAGNOSIS — Z7982 Long term (current) use of aspirin: Secondary | ICD-10-CM | POA: Insufficient documentation

## 2019-02-10 DIAGNOSIS — Z79899 Other long term (current) drug therapy: Secondary | ICD-10-CM | POA: Diagnosis not present

## 2019-02-10 DIAGNOSIS — F250 Schizoaffective disorder, bipolar type: Secondary | ICD-10-CM | POA: Insufficient documentation

## 2019-02-10 DIAGNOSIS — F329 Major depressive disorder, single episode, unspecified: Secondary | ICD-10-CM | POA: Diagnosis present

## 2019-02-10 DIAGNOSIS — Z794 Long term (current) use of insulin: Secondary | ICD-10-CM | POA: Diagnosis not present

## 2019-02-10 DIAGNOSIS — E119 Type 2 diabetes mellitus without complications: Secondary | ICD-10-CM | POA: Diagnosis not present

## 2019-02-10 DIAGNOSIS — I10 Essential (primary) hypertension: Secondary | ICD-10-CM | POA: Insufficient documentation

## 2019-02-10 DIAGNOSIS — R45851 Suicidal ideations: Secondary | ICD-10-CM | POA: Diagnosis not present

## 2019-02-10 LAB — COMPREHENSIVE METABOLIC PANEL
ALT: 24 U/L (ref 0–44)
AST: 26 U/L (ref 15–41)
Albumin: 4.3 g/dL (ref 3.5–5.0)
Alkaline Phosphatase: 131 U/L — ABNORMAL HIGH (ref 38–126)
Anion gap: 7 (ref 5–15)
BUN: 9 mg/dL (ref 6–20)
CO2: 29 mmol/L (ref 22–32)
Calcium: 9.2 mg/dL (ref 8.9–10.3)
Chloride: 101 mmol/L (ref 98–111)
Creatinine, Ser: 0.9 mg/dL (ref 0.61–1.24)
GFR calc Af Amer: >60 mL/min (ref >60)
GFR calc non Af Amer: >60 mL/min (ref >60)
Glucose, Bld: 168 mg/dL — ABNORMAL HIGH (ref 70–99)
Potassium: 4.1 mmol/L (ref 3.5–5.1)
Sodium: 137 mmol/L (ref 135–145)
Total Bilirubin: 0.7 mg/dL (ref 0.3–1.2)
Total Protein: 8 g/dL (ref 6.5–8.1)

## 2019-02-10 LAB — RAPID URINE DRUG SCREEN, HOSP PERFORMED
Amphetamines: NOT DETECTED
Barbiturates: NOT DETECTED
Benzodiazepines: NOT DETECTED
Cocaine: NOT DETECTED
Opiates: POSITIVE — AB
Tetrahydrocannabinol: NOT DETECTED

## 2019-02-10 LAB — CBG MONITORING, ED
Glucose-Capillary: 152 mg/dL — ABNORMAL HIGH (ref 70–99)
Glucose-Capillary: 217 mg/dL — ABNORMAL HIGH (ref 70–99)

## 2019-02-10 LAB — CBC
HCT: 41 % (ref 39.0–52.0)
Hemoglobin: 13.9 g/dL (ref 13.0–17.0)
MCH: 32.1 pg (ref 26.0–34.0)
MCHC: 33.9 g/dL (ref 30.0–36.0)
MCV: 94.7 fL (ref 80.0–100.0)
Platelets: 262 10*3/uL (ref 150–400)
RBC: 4.33 MIL/uL (ref 4.22–5.81)
RDW: 13 % (ref 11.5–15.5)
WBC: 5.2 10*3/uL (ref 4.0–10.5)
nRBC: 0 % (ref 0.0–0.2)

## 2019-02-10 LAB — SALICYLATE LEVEL: Salicylate Lvl: <7 mg/dL (ref 2.8–30.0)

## 2019-02-10 LAB — ETHANOL: Alcohol, Ethyl (B): <10 mg/dL (ref <10)

## 2019-02-10 LAB — SARS CORONAVIRUS 2 (TAT 6-24 HRS): SARS Coronavirus 2: NEGATIVE

## 2019-02-10 LAB — ACETAMINOPHEN LEVEL: Acetaminophen (Tylenol), Serum: <10 ug/mL — ABNORMAL LOW (ref 10–30)

## 2019-02-10 MED ORDER — INSULIN ASPART 100 UNIT/ML ~~LOC~~ SOLN: 0.0000 [IU] | Freq: Every day | SUBCUTANEOUS | Status: DC

## 2019-02-10 MED ORDER — INSULIN ASPART 100 UNIT/ML ~~LOC~~ SOLN
0.0000 [IU] | Freq: Three times a day (TID) | SUBCUTANEOUS | Status: DC
  Administered 2019-02-10: 5 [IU] via SUBCUTANEOUS

## 2019-02-10 MED ORDER — BENZTROPINE MESYLATE 1 MG PO TABS
1.0000 mg | ORAL_TABLET | Freq: Two times a day (BID) | ORAL | Status: DC
  Administered 2019-02-10: 15:00:00 1 mg via ORAL
  Filled 2019-02-10: qty 1

## 2019-02-10 MED ORDER — HALOPERIDOL 5 MG PO TABS
20.0000 mg | ORAL_TABLET | Freq: Once | ORAL | Status: AC
  Administered 2019-02-10: 17:00:00 20 mg via ORAL
  Filled 2019-02-10: qty 4

## 2019-02-10 MED ORDER — TRAZODONE HCL 100 MG PO TABS: 100.0000 mg | ORAL_TABLET | Freq: Every day | ORAL | Status: DC

## 2019-02-10 MED ORDER — CLONIDINE HCL 0.2 MG PO TABS
0.2000 mg | ORAL_TABLET | Freq: Two times a day (BID) | ORAL | Status: DC
  Administered 2019-02-10: 15:00:00 0.2 mg via ORAL
  Filled 2019-02-10: qty 1

## 2019-02-10 MED ORDER — LISINOPRIL 20 MG PO TABS
40.0000 mg | ORAL_TABLET | Freq: Every day | ORAL | Status: DC
  Administered 2019-02-10: 15:00:00 40 mg via ORAL
  Filled 2019-02-10: qty 2

## 2019-02-10 MED ORDER — ASPIRIN EC 81 MG PO TBEC
81.0000 mg | DELAYED_RELEASE_TABLET | Freq: Every day | ORAL | Status: DC
  Administered 2019-02-10: 81 mg via ORAL
  Filled 2019-02-10: qty 1

## 2019-02-10 MED ORDER — HALOPERIDOL 5 MG PO TABS: 20.0000 mg | ORAL_TABLET | Freq: Every day | ORAL | Status: DC

## 2019-02-10 NOTE — ED Notes (Addendum)
Pharm Tech attempting to reach [facility] re: pt's meds they prescribed. Pt ate [place] sandwich given. SW aware of need for consult.

## 2019-02-10 NOTE — ED Triage Notes (Signed)
Pt endorses being off of his BP, DM, and psychiatric medications x 3 days. Having suicidal thoughts of "jumping out of an ambulance truck" to harm himself and having thoughts of harming others. Cooperative.

## 2019-02-10 NOTE — ED Provider Notes (Signed)
[facility] Provider Note   CSN: [id number] Arrival date & time: 02/10/19  1250     History   Chief Complaint Chief Complaint  Patient presents with  . Psychiatric Evaluation  . Suicidal    HPI Mitchell [name] is a 58 y.o. male.      Mental Health Problem Presenting symptoms: depression, hallucinations, suicidal thoughts, suicidal threats and suicide attempt (patient reports he was walking on the street and tried to go out into traffic but wasnt able to do it and came here for evaluation)   Presenting symptoms: no homicidal ideas and no self-mutilation   Degree of incapacity (severity):  Moderate Onset quality:  Gradual Duration:  6 months Timing:  Constant Progression:  Worsening Chronicity:  Recurrent Context: medication, noncompliance (patient reports he lost his medications several days ago) and stressful life event   Treatment compliance:  Untreated Time since last psychoactive medication taken:  1 week Relieved by:  None tried Worsened by:  Nothing Ineffective treatments:  None tried Associated symptoms: anhedonia, feelings of worthlessness, irritability and poor judgment   Associated symptoms: no abdominal pain and no chest pain   Risk factors: hx of mental illness and recent psychiatric admission     Past Medical History:  Diagnosis Date  . Bipolar affective disorder (HCC)    takes Zyprexa daily  . Diabetes mellitus    takes Victoza,Metformin,and Glipizide daily  . Hypertension    takes Amlodipine,Lisinopril and Clonidine daily  . Hyponatremia    history of  . Mental disorder    takes Lithium daily  . Schizoaffective disorder    takes Trazodone nightly  . Seasonal allergies    takes Claritin daily  . Sleep apnea    sleep study >5641yrs ago  . Stroke [facility](HCC)    left arm weakness    Patient Active Problem List   Diagnosis Date Noted  . Schizoaffective disorder (HCC) 01/04/2019  . Schizophrenia (HCC) 07/18/2018   . 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 Coarse tremors 10/02/2014  . Hypertension   . Type 2 diabetes mellitus (HCC)   . Sleep apnea   . Hyponatremia 03/13/2011  . Schizoaffective disorder, bipolar type (HCC) 02/28/2011    Past Surgical History:  Procedure Laterality Date  . CATARACT EXTRACTION W/PHACO Right 02/14/2013   Procedure: CATARACT EXTRACTION PHACO AND INTRAOCULAR LENS PLACEMENT (IOC);  Surgeon: Shade FloodGreer Geiger, MD;  [facility];  Service: Ophthalmology;  Laterality: Right;  . CATARACT EXTRACTION W/PHACO Left 06/13/2013   Procedure: CATARACT EXTRACTION PHACO AND INTRAOCULAR LENS PLACEMENT (IOC);  Surgeon: Shade FloodGreer Geiger, MD;  [facility];  Service: Ophthalmology;  Laterality: Left;  . CIRCUMCISION  20 yrs. ago  . EYE SURGERY          Home Medications    Prior to Admission medications   Medication Sig Start Date End Date Taking? Authorizing Provider  amLODipine (NORVASC) 10 MG tablet Take 1 tablet (10 mg total) by mouth daily. Patient not taking: Reported on 01/20/2019 01/11/19   Malvin JohnsFarah, Brian, MD  aspirin 81 MG EC tablet Take 1 tablet (81 mg total) by mouth daily. Swallow whole. 01/11/19   Malvin JohnsFarah, Brian, MD  benztropine (COGENTIN) 1 MG tablet Take 1 tablet (1 mg total) by mouth 2 (two) times daily. 01/10/19   Malvin JohnsFarah, Brian, MD  cloNIDine (CATAPRES) 0.2 MG tablet Take 0.2 mg by mouth 2 (two) times daily.    [provider]  fluticasone (FLONASE) 50 MCG/ACT nasal spray Place 1 spray into both  nostrils daily.    [provider]  glipiZIDE (GLUCOTROL XL) 10 MG 24 hr tablet Take 1 tablet (10 mg total) by mouth daily with breakfast. 01/11/19   Malvin Johns, MD  haloperidol (HALDOL) 20 MG tablet Take 1 tablet (20 mg total) by mouth at bedtime. 01/10/19   Malvin Johns, MD  insulin glargine (LANTUS) 100 UNIT/ML injection Inject 10 Units into the skin daily.    [provider]  insulin lispro (HUMALOG) 100 UNIT/ML KwikPen Inject 2-10 Units into the skin 3 (three)  times daily. Per sliding scale 09/25/18   [provider]  liraglutide (VICTOZA) 18 MG/3ML SOPN Inject 1.8 mg into the skin daily.    [provider]  lisinopril (ZESTRIL) 40 MG tablet Take 1 tablet (40 mg total) by mouth daily. 01/11/19   Malvin Johns, MD  lithium carbonate (ESKALITH) 450 MG CR tablet Take 1 tablet (450 mg total) by mouth every 12 (twelve) hours. Patient taking differently: Take 450 mg by mouth at bedtime.  01/10/19   Malvin Johns, MD  lithium carbonate 300 MG capsule Take 300 mg by mouth every morning. 09/20/18   [provider]  metFORMIN (GLUCOPHAGE) 1000 MG tablet Take 1 tablet (1,000 mg total) by mouth 2 (two) times daily with a meal. 01/10/19   Malvin Johns, MD  traZODone (DESYREL) 100 MG tablet Take 100 mg by mouth at bedtime. 09/27/18   [provider]    Family History History reviewed. No pertinent family history.  Social History Social History   Tobacco Use  . Smoking status: Never Smoker  . Smokeless tobacco: Never Used  Substance Use Topics  . Alcohol use: Yes    Comment: "a whole lot of wine every day"  . Drug use: No     Allergies   Patient has no known allergies.   Review of Systems Review of Systems  Constitutional: Positive for irritability. Negative for chills and fever.  HENT: Negative for ear pain and sore throat.   Eyes: Negative for pain and visual disturbance.  Respiratory: Negative for cough and shortness of breath.   Cardiovascular: Negative for chest pain and palpitations.  Gastrointestinal: Negative for abdominal pain and vomiting.  Genitourinary: Negative for dysuria and hematuria.  Musculoskeletal: Negative for arthralgias and back pain.  Skin: Negative for color change and rash.  Neurological: Negative for seizures and syncope.  Psychiatric/Behavioral: Positive for behavioral problems, dysphoric mood, hallucinations and suicidal ideas. Negative for homicidal ideas and self-injury.  All other  systems reviewed and are negative.    Physical Exam Updated Vital Signs BP (!) 139/98   Pulse 75   Temp 98 F (36.7 C) (Oral)   Resp 15   SpO2 99%   Physical Exam Vitals signs and nursing note reviewed.  Constitutional:      Appearance: He is well-developed. He is not ill-appearing.  HENT:     Head: Normocephalic and atraumatic.  Eyes:     Conjunctiva/sclera: Conjunctivae normal.  Neck:     Musculoskeletal: Neck supple.  Cardiovascular:     Rate and Rhythm: Normal rate and regular rhythm.     Heart sounds: No murmur.  Pulmonary:     Effort: Pulmonary effort is normal. No respiratory distress.     Breath sounds: Normal breath sounds.  Abdominal:     Palpations: Abdomen is soft.     Tenderness: There is no abdominal tenderness.  Skin:    General: Skin is warm and dry.     Capillary Refill: Capillary  refill takes less than 2 seconds.  Neurological:     General: No focal deficit present.     Mental Status: He is alert and oriented to person, place, and time.  Psychiatric:     Comments: Depressed mood, flat affect, admits to auditory hallucination      ED Treatments / Results  Labs (all labs ordered are listed, but only abnormal results are displayed) Labs Reviewed  COMPREHENSIVE METABOLIC PANEL - Abnormal; Notable for the following components:      Result Value   Glucose, Bld 168 (*)    Alkaline Phosphatase 131 (*)    All other components within normal limits  ACETAMINOPHEN LEVEL - Abnormal; Notable for the following components:   Acetaminophen (Tylenol), Serum <10 (*)    All other components within normal limits  RAPID URINE DRUG SCREEN, HOSP PERFORMED - Abnormal; Notable for the following components:   Opiates POSITIVE (*)    All other components within normal limits  CBG MONITORING, ED - Abnormal; Notable for the following components:   Glucose-Capillary 152 (*)    All other components within normal limits  SARS CORONAVIRUS 2 (TAT 6-24 HRS)  ETHANOL   SALICYLATE LEVEL  CBC    EKG None  Radiology No results found.  Procedures Procedures (including critical care time)  Medications Ordered in ED Medications  aspirin EC tablet 81 mg (81 mg Oral Given 02/10/19 1513)  benztropine (COGENTIN) tablet 1 mg (1 mg Oral Given 02/10/19 1513)  cloNIDine (CATAPRES) tablet 0.2 mg (0.2 mg Oral Given 02/10/19 1513)  lisinopril (ZESTRIL) tablet 40 mg (40 mg Oral Given 02/10/19 1513)  traZODone (DESYREL) tablet 100 mg (has no administration in time range)  insulin aspart (novoLOG) injection 0-15 Units (has no administration in time range)  insulin aspart (novoLOG) injection 0-5 Units (has no administration in time range)     Initial Impression / Assessment and Plan / ED Course  I have reviewed the triage vital signs and the nursing notes.  Pertinent labs & imaging results that were available during my care of the patient were reviewed by me and considered in my medical decision making (see chart for details).        Patient is a 58 year old male with history and physical exam as above presents emergency department for evaluation of suicidal ideation.  He states that he has been having thoughts of jumping in front of traffic and states that he was walking down the street today but decided that instead of jumping out in front of traffic he would come to the emergency department for psychiatric evaluation instead.  Patient states that he has been having auditory visual hallucinations as he states that he lost his antipsychiatric medication as well as his medications for diabetes.  Patient is a type I diabetic.  Labs were obtained in the emergency department demonstrated no emergent abnormalities.  Mildly hyperglycemic at the 160s.  Sliding scale insulin regimen ordered.  Transitioned to psych obs status.  Final Clinical Impressions(s) / ED Diagnoses   Final diagnoses:  Suicidal ideation    ED Discharge Orders    None       Romona Curls, MD 02/10/19 1626    Noemi Chapel, MD 02/11/19 [phone]
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
## 2019-02-10 NOTE — ED Notes (Signed)
Joey, SW, provided pt w/resources.

## 2019-02-10 NOTE — ED Provider Notes (Signed)
Patient has been cleared for discharge by psychiatry.  I did talk with the patient and he currently denies any suicidal ideations.  He was discharged with outpatient resources.   Malvin Johns, MD 02/10/19 1753

## 2019-02-10 NOTE — ED Notes (Signed)
Pt given d/c paperwork and has resources from SW - pt voiced understanding to follow up as instructed. Pt changed into his personal clothing and has his 2 back packs - verified he has all of his belongings then RN escorted pt to lobby.

## 2019-02-10 NOTE — ED Notes (Signed)
ALL belongings - 2 labeled belongings bag and 2 back packs - returned to pt - Pt verbalized all items present.

## 2019-02-10 NOTE — ED Notes (Signed)
Pt belongings inventoried and placed in purple zone locker #2.

## 2019-02-13 ENCOUNTER — Encounter (HOSPITAL_COMMUNITY): Payer: Self-pay | Admitting: Emergency Medicine

## 2019-02-13 ENCOUNTER — Other Ambulatory Visit: Payer: Self-pay

## 2019-02-13 ENCOUNTER — Emergency Department (HOSPITAL_COMMUNITY)
Admission: EM | Admit: 2019-02-13 | Discharge: 2019-02-14 | Disposition: A | Discharge status: Home / Self Care | Payer: Medicare Other | Attending: Emergency Medicine | Admitting: Emergency Medicine

## 2019-02-13 ENCOUNTER — Emergency Department (HOSPITAL_COMMUNITY): Payer: Medicare Other

## 2019-02-13 DIAGNOSIS — E1165 Type 2 diabetes mellitus with hyperglycemia: Secondary | ICD-10-CM | POA: Insufficient documentation

## 2019-02-13 DIAGNOSIS — Z20828 Contact with and (suspected) exposure to other viral communicable diseases: Secondary | ICD-10-CM | POA: Diagnosis not present

## 2019-02-13 DIAGNOSIS — F259 Schizoaffective disorder, unspecified: Secondary | ICD-10-CM

## 2019-02-13 DIAGNOSIS — Z7982 Long term (current) use of aspirin: Secondary | ICD-10-CM | POA: Insufficient documentation

## 2019-02-13 DIAGNOSIS — Z8673 Personal history of transient ischemic attack (TIA), and cerebral infarction without residual deficits: Secondary | ICD-10-CM | POA: Diagnosis not present

## 2019-02-13 DIAGNOSIS — I10 Essential (primary) hypertension: Secondary | ICD-10-CM | POA: Insufficient documentation

## 2019-02-13 DIAGNOSIS — R739 Hyperglycemia, unspecified: Secondary | ICD-10-CM | POA: Diagnosis present

## 2019-02-13 DIAGNOSIS — Z79899 Other long term (current) drug therapy: Secondary | ICD-10-CM | POA: Insufficient documentation

## 2019-02-13 LAB — URINALYSIS, ROUTINE W REFLEX MICROSCOPIC
Bilirubin Urine: NEGATIVE
Glucose, UA: NEGATIVE mg/dL
Hgb urine dipstick: NEGATIVE
Ketones, ur: NEGATIVE mg/dL
Leukocytes,Ua: NEGATIVE
Nitrite: NEGATIVE
Protein, ur: NEGATIVE mg/dL
Specific Gravity, Urine: 1 — ABNORMAL LOW (ref 1.005–1.030)
pH: 6 (ref 5.0–8.0)

## 2019-02-13 LAB — CBC
HCT: 41.2 % (ref 39.0–52.0)
Hemoglobin: 13.6 g/dL (ref 13.0–17.0)
MCH: 31.6 pg (ref 26.0–34.0)
MCHC: 33 g/dL (ref 30.0–36.0)
MCV: 95.6 fL (ref 80.0–100.0)
Platelets: 267 10*3/uL (ref 150–400)
RBC: 4.31 MIL/uL (ref 4.22–5.81)
RDW: 12.9 % (ref 11.5–15.5)
WBC: 5.5 10*3/uL (ref 4.0–10.5)
nRBC: 0 % (ref 0.0–0.2)

## 2019-02-13 LAB — BASIC METABOLIC PANEL
Anion gap: 12 (ref 5–15)
BUN: 11 mg/dL (ref 6–20)
CO2: 24 mmol/L (ref 22–32)
Calcium: 9.6 mg/dL (ref 8.9–10.3)
Chloride: 103 mmol/L (ref 98–111)
Creatinine, Ser: 1.05 mg/dL (ref 0.61–1.24)
GFR calc Af Amer: >60 mL/min (ref >60)
GFR calc non Af Amer: >60 mL/min (ref >60)
Glucose, Bld: 247 mg/dL — ABNORMAL HIGH (ref 70–99)
Potassium: 3.9 mmol/L (ref 3.5–5.1)
Sodium: 139 mmol/L (ref 135–145)

## 2019-02-13 LAB — LITHIUM LEVEL: Lithium Lvl: <0.06 mmol/L — ABNORMAL LOW (ref 0.60–1.20)

## 2019-02-13 LAB — CBG MONITORING, ED
Glucose-Capillary: 224 mg/dL — ABNORMAL HIGH (ref 70–99)
Glucose-Capillary: 237 mg/dL — ABNORMAL HIGH (ref 70–99)
Glucose-Capillary: 135 mg/dL — ABNORMAL HIGH (ref 70–99)

## 2019-02-13 LAB — SARS CORONAVIRUS 2 (TAT 6-24 HRS): SARS Coronavirus 2: NEGATIVE

## 2019-02-13 MED ORDER — NICOTINE 21 MG/24HR TD PT24
21.0000 mg | MEDICATED_PATCH | Freq: Every day | TRANSDERMAL | Status: DC
  Administered 2019-02-13 – 2019-02-14 (×2): 21 mg via TRANSDERMAL
  Filled 2019-02-13 (×2): qty 1

## 2019-02-13 MED ORDER — HALOPERIDOL 5 MG PO TABS
20.0000 mg | ORAL_TABLET | Freq: Every day | ORAL | Status: DC
  Administered 2019-02-13: 20 mg via ORAL
  Filled 2019-02-13: qty 4

## 2019-02-13 MED ORDER — LITHIUM CARBONATE ER 450 MG PO TBCR
450.0000 mg | EXTENDED_RELEASE_TABLET | Freq: Every day | ORAL | Status: DC
  Administered 2019-02-13: 450 mg via ORAL
  Filled 2019-02-13 (×2): qty 1

## 2019-02-13 MED ORDER — ACETAMINOPHEN 325 MG PO TABS: 650.0000 mg | ORAL_TABLET | Freq: Once | ORAL | Status: DC

## 2019-02-13 MED ORDER — GLIPIZIDE ER 10 MG PO TB24
10.0000 mg | ORAL_TABLET | Freq: Every day | ORAL | Status: DC
  Administered 2019-02-14: 10 mg via ORAL
  Filled 2019-02-13 (×2): qty 1

## 2019-02-13 MED ORDER — ACETAMINOPHEN 325 MG PO TABS
650.0000 mg | ORAL_TABLET | ORAL | Status: DC | PRN
  Administered 2019-02-13: 650 mg via ORAL
  Filled 2019-02-13: qty 2

## 2019-02-13 MED ORDER — LIRAGLUTIDE 18 MG/3ML ~~LOC~~ SOPN: 1.8000 mg | PEN_INJECTOR | Freq: Every day | SUBCUTANEOUS | Status: DC

## 2019-02-13 MED ORDER — ONDANSETRON HCL 4 MG PO TABS: 4.0000 mg | ORAL_TABLET | Freq: Three times a day (TID) | ORAL | Status: DC | PRN

## 2019-02-13 MED ORDER — INSULIN ASPART 100 UNIT/ML ~~LOC~~ SOLN
5.0000 [IU] | Freq: Three times a day (TID) | SUBCUTANEOUS | Status: DC
  Administered 2019-02-13 – 2019-02-14 (×2): 5 [IU] via SUBCUTANEOUS

## 2019-02-13 MED ORDER — BENZTROPINE MESYLATE 1 MG PO TABS
1.0000 mg | ORAL_TABLET | Freq: Two times a day (BID) | ORAL | Status: DC
  Administered 2019-02-13 – 2019-02-14 (×3): 1 mg via ORAL
  Filled 2019-02-13 (×3): qty 1

## 2019-02-13 MED ORDER — FLUTICASONE PROPIONATE 50 MCG/ACT NA SUSP
1.0000 | Freq: Every day | NASAL | Status: DC
  Administered 2019-02-13: 11:00:00 1 via NASAL
  Filled 2019-02-13: qty 16

## 2019-02-13 MED ORDER — INSULIN GLARGINE 100 UNIT/ML ~~LOC~~ SOLN
10.0000 [IU] | Freq: Every day | SUBCUTANEOUS | Status: DC
  Administered 2019-02-13 – 2019-02-14 (×2): 10 [IU] via SUBCUTANEOUS
  Filled 2019-02-13 (×2): qty 0.1

## 2019-02-13 MED ORDER — LITHIUM CARBONATE 300 MG PO CAPS
300.0000 mg | ORAL_CAPSULE | Freq: Every morning | ORAL | Status: DC
  Administered 2019-02-13 – 2019-02-14 (×2): 300 mg via ORAL
  Filled 2019-02-13 (×2): qty 1

## 2019-02-13 MED ORDER — METFORMIN HCL 500 MG PO TABS
1000.0000 mg | ORAL_TABLET | Freq: Two times a day (BID) | ORAL | Status: DC
  Administered 2019-02-13 – 2019-02-14 (×2): 1000 mg via ORAL
  Filled 2019-02-13 (×3): qty 2

## 2019-02-13 MED ORDER — TRAZODONE HCL 50 MG PO TABS
100.0000 mg | ORAL_TABLET | Freq: Every day | ORAL | Status: DC
  Administered 2019-02-13: 100 mg via ORAL
  Filled 2019-02-13: qty 1

## 2019-02-13 MED ORDER — CLONIDINE HCL 0.2 MG PO TABS
0.2000 mg | ORAL_TABLET | Freq: Two times a day (BID) | ORAL | Status: DC
  Administered 2019-02-13 – 2019-02-14 (×3): 0.2 mg via ORAL
  Filled 2019-02-13 (×3): qty 1

## 2019-02-13 MED ORDER — INSULIN LISPRO (1 UNIT DIAL) 100 UNIT/ML (KWIKPEN): 2.0000 [IU] | PEN_INJECTOR | Freq: Three times a day (TID) | SUBCUTANEOUS | Status: DC

## 2019-02-13 MED ORDER — LISINOPRIL 20 MG PO TABS
40.0000 mg | ORAL_TABLET | Freq: Every day | ORAL | Status: DC
  Administered 2019-02-13 – 2019-02-14 (×2): 40 mg via ORAL
  Filled 2019-02-13 (×2): qty 2

## 2019-02-13 MED ORDER — ASPIRIN EC 81 MG PO TBEC
81.0000 mg | DELAYED_RELEASE_TABLET | Freq: Every day | ORAL | Status: DC
  Administered 2019-02-13 – 2019-02-14 (×2): 81 mg via ORAL
  Filled 2019-02-13 (×2): qty 1

## 2019-02-13 NOTE — BHH Counselor (Signed)
TTS contacted [facility] ED to complete assessment. Per Luellen Pucker, RN patient is in a hall bed and there are no rooms available to put patient in for assessment. RN states she will contact [facility] when a room is available.

## 2019-02-13 NOTE — ED Notes (Signed)
Pt ambulatory to Rm 50 - wearing burgundy scrubs - Sitter w/pt. Pt states his name is "Mitchell [name] but people call me Mitchell [name]". Pt noted to be calm, cooperative. Advised pt of tx plan - accepted to [facility] and may arrive tomorrow (02/14/2019). Pt voiced understanding and appreciation.

## 2019-02-13 NOTE — Progress Notes (Signed)
Inpatient Diabetes Program Recommendations  AACE/ADA: New Consensus Statement on Inpatient Glycemic Control (2015)  Target Ranges:  Prepandial:   less than 140 mg/dL      Peak postprandial:   less than 180 mg/dL (1-2 hours)      Critically ill patients:  140 - 180 mg/dL   Results for GILMER, KAMINSKY (MRN [id number]) as of 02/13/2019 13:46  Ref. Range 02/13/2019 02:12 02/13/2019 09:48  Glucose-Capillary Latest Ref Range: 70 - 99 mg/dL 224 (H) 237 (H)  10 units LANTUS     To ED with Hyperglycemia/ Need for Psychiatric admission per ED MD notes  History: DM, Bipolar d/o, Schizoaffective d/o  Home DM Meds: Lantus 10 units Daily       Humalog 2-10 units TID        Glipizide 10 mg Daily       Victoza 1.8 mg Daily       Metformin 1000 mg BID  Current Orders: Lantus 10 units Daily      Novolog 5 units TID with meals      Glipizide 10 mg daily      Metformin 1000 mg BID     MD- Please also consider placing orders for Novolog Sensitive Correction Scale/ SSI (0-9 units) TID AC + HS     --Will follow patient during hospitalization--  Wyn Quaker RN, MSN, CDE Diabetes Coordinator Inpatient Glycemic Control Team Team Pager: [phone] (8a-5p)

## 2019-02-13 NOTE — ED Notes (Signed)
Lunch Tray Ordered @ 1006.  

## 2019-02-13 NOTE — ED Triage Notes (Signed)
Patient states that he is a diabetic and he has hyperglycemia.  Patient also endorses back pain.

## 2019-02-13 NOTE — Care Management (Signed)
Patient accepted to: [facility] Accepting Dr. Jonelle Sports  The number to give report [phone] The date and time that the patient can be transported 02/14/2019  after 9am

## 2019-02-13 NOTE — ED Notes (Signed)
Accepted to [facility] tomorrow
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
## 2019-02-13 NOTE — Care Management (Signed)
Under review [place], [place], [place], [place], [place], [place], [place], [place], [place], [place], [place], [place], [place], [place], [place], [place], [place], [place], [place], [place], [place], [place], [place], [place], [place], [place], [place], [place], [place], [facility], [place], [place], [place]

## 2019-02-13 NOTE — ED Notes (Signed)
Diet ordered 

## 2019-02-13 NOTE — ED Provider Notes (Signed)
[name] EMERGENCY DEPARTMENT Provider Note   CSN: [id number] Arrival date & time: 02/13/19  0151     History   Chief Complaint Chief Complaint  Patient presents with  . Hyperglycemia    HPI Mitchell [name] is a 58 y.o. male.     HPI  58 year old male presents with hyperglycemia, back pain, and need for psychiatric admission.  He tells me that he had high glucose yesterday in the 300s.  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  He denies any weakness or numbness in his back.  Has not take anything for the pain.  No incontinence.  He tells me that he has been compliant with his glucose medicines but also states he has not been able to take his psychiatric meds.  He tells me that he was told by [facility] to come [facility] [place].  He states he is not suicidal but is homicidal towards "anyone" and that he has been hearing voices and seeing things.  Past Medical History:  Diagnosis Date  . Bipolar affective disorder (HCC)    takes Zyprexa daily  . Diabetes mellitus    takes Victoza,Metformin,and Glipizide daily  . Hypertension    takes Amlodipine,Lisinopril and Clonidine daily  . Hyponatremia    history of  . Mental disorder    takes Lithium daily  . Schizoaffective disorder    takes Trazodone nightly  . Seasonal allergies    takes Claritin daily  . Sleep apnea    sleep study >29yrs ago  . Stroke [facility])    left arm weakness    Patient Active Problem List   Diagnosis Date Noted  . Schizoaffective disorder (HCC) 01/04/2019  . Schizophrenia (HCC) 07/18/2018  . 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 Coarse tremors 10/02/2014  . Hypertension   . Type 2 diabetes mellitus (HCC)   . Sleep apnea   . Hyponatremia 03/13/2011  . Schizoaffective disorder, bipolar type (HCC) 02/28/2011    Past Surgical History:  Procedure Laterality Date  . CATARACT EXTRACTION W/PHACO Right 02/14/2013   Procedure: CATARACT  EXTRACTION PHACO AND INTRAOCULAR LENS PLACEMENT (IOC);  Surgeon: Shade Flood, MD;  Location: [facility] OR;  Service: Ophthalmology;  Laterality: Right;  . CATARACT EXTRACTION W/PHACO Left 06/13/2013   Procedure: CATARACT EXTRACTION PHACO AND INTRAOCULAR LENS PLACEMENT (IOC);  Surgeon: Shade Flood, MD;  Location: [facility] OR;  Service: Ophthalmology;  Laterality: Left;  . CIRCUMCISION  20 yrs. ago  . EYE SURGERY          Home Medications    Prior to Admission medications   Medication Sig Start Date End Date Taking? Authorizing Provider  amLODipine (NORVASC) 10 MG tablet Take 1 tablet (10 mg total) by mouth daily. Patient not taking: Reported on 01/20/2019 01/11/19   Malvin Johns, MD  aspirin 81 MG EC tablet Take 1 tablet (81 mg total) by mouth daily. Swallow whole. 01/11/19   Malvin Johns, MD  benztropine (COGENTIN) 1 MG tablet Take 1 tablet (1 mg total) by mouth 2 (two) times daily. 01/10/19   Malvin Johns, MD  cloNIDine (CATAPRES) 0.2 MG tablet Take 0.2 mg by mouth 2 (two) times daily.    [provider]  fluticasone (FLONASE) 50 MCG/ACT nasal spray Place 1 spray into both nostrils daily.    [provider]  glipiZIDE (GLUCOTROL XL) 10 MG 24 hr tablet Take 1 tablet (10 mg total) by mouth daily with breakfast. 01/11/19  Malvin Johns, MD  haloperidol (HALDOL) 20 MG tablet Take 1 tablet (20 mg total) by mouth at bedtime. 01/10/19   Malvin Johns, MD  insulin glargine (LANTUS) 100 UNIT/ML injection Inject 10 Units into the skin daily.    [provider]  insulin lispro (HUMALOG) 100 UNIT/ML KwikPen Inject 2-10 Units into the skin 3 (three) times daily. Per sliding scale 09/25/18   [provider]  liraglutide (VICTOZA) 18 MG/3ML SOPN Inject 1.8 mg into the skin daily.    [provider]  lisinopril (ZESTRIL) 40 MG tablet Take 1 tablet (40 mg total) by mouth daily. 01/11/19   Malvin Johns, MD  lithium carbonate (ESKALITH) 450 MG CR tablet Take 1 tablet (450 mg  total) by mouth every 12 (twelve) hours. Patient taking differently: Take 450 mg by mouth at bedtime.  01/10/19   Malvin Johns, MD  lithium carbonate 300 MG capsule Take 300 mg by mouth every morning. 09/20/18   [provider]  metFORMIN (GLUCOPHAGE) 1000 MG tablet Take 1 tablet (1,000 mg total) by mouth 2 (two) times daily with a meal. 01/10/19   Malvin Johns, MD  traZODone (DESYREL) 100 MG tablet Take 100 mg by mouth at bedtime. 09/27/18   [provider]    Family History No family history on file.  Social History Social History   Tobacco Use  . Smoking status: Never Smoker  . Smokeless tobacco: Never Used  Substance Use Topics  . Alcohol use: Yes    Comment: "a whole lot of wine every day"  . Drug use: No     Allergies   Patient has no known allergies.   Review of Systems Review of Systems  Gastrointestinal: Negative for abdominal pain.  Musculoskeletal: Positive for back pain.  Neurological: Negative for weakness and numbness.  Psychiatric/Behavioral: Positive for hallucinations. Negative for suicidal ideas.  All other systems reviewed and are negative.    Physical Exam Updated Vital Signs BP 113/83 (BP Location: Right Arm)   Pulse 70   Temp 97.7 F (36.5 C) (Oral)   Resp 16   SpO2 100%   Physical Exam Vitals signs and nursing note reviewed.  Constitutional:      General: He is not in acute distress.    Appearance: He is well-developed. He is not ill-appearing or diaphoretic.  HENT:     Head: Normocephalic and atraumatic.     Right Ear: External ear normal.     Left Ear: External ear normal.     Nose: Nose normal.  Eyes:     General:        Right eye: No discharge.        Left eye: No discharge.  Neck:     Musculoskeletal: Neck supple.  Cardiovascular:     Rate and Rhythm: Normal rate and regular rhythm.     Heart sounds: Normal heart sounds.  Pulmonary:     Effort: Pulmonary effort is normal.     Breath sounds: Normal breath  sounds.  Abdominal:     Palpations: Abdomen is soft.     Tenderness: There is no abdominal tenderness.  Musculoskeletal:     Cervical back: He exhibits no tenderness.     Thoracic back: He exhibits no tenderness.     Lumbar back: He exhibits tenderness.  Skin:    General: Skin is warm and dry.  Neurological:     Mental Status: He is alert.     Comments: 5/5 strength in BLE. Normal gross sensation. Normal  gait  Psychiatric:        Attention and Perception: He does not perceive auditory or visual hallucinations.        Mood and Affect: Mood is not anxious.        Thought Content: Thought content does not include suicidal ideation.      ED Treatments / Results  Labs (all labs ordered are listed, but only abnormal results are displayed) Labs Reviewed  BASIC METABOLIC PANEL - Abnormal; Notable for the following components:      Result Value   Glucose, Bld 247 (*)    All other components within normal limits  URINALYSIS, ROUTINE W REFLEX MICROSCOPIC - Abnormal; Notable for the following components:   Color, Urine COLORLESS (*)    Specific Gravity, Urine 1.000 (*)    All other components within normal limits  LITHIUM LEVEL - Abnormal; Notable for the following components:   Lithium Lvl <0.06 (*)    All other components within normal limits  CBG MONITORING, ED - Abnormal; Notable for the following components:   Glucose-Capillary 224 (*)    All other components within normal limits  CBG MONITORING, ED - Abnormal; Notable for the following components:   Glucose-Capillary 237 (*)    All other components within normal limits  SARS CORONAVIRUS 2 (TAT 6-24 HRS)  CBC  CBG MONITORING, ED    EKG None  Radiology Dg Lumbar Spine Complete  Result Date: 02/13/2019 CLINICAL DATA:  Back pain post fall on the sidewalk last night EXAM: LUMBAR SPINE - COMPLETE 4+ VIEW COMPARISON:  None FINDINGS: 5 non-rib-bearing lumbar vertebra. Minimal levoconvex scoliosis. Scattered small endplate  spurs and minor disc space narrowing throughout caudal lumbar spine. Facet degenerative changes lower lumbar spine. Vertebral body heights maintained without fracture or subluxation. No bone destruction or spondylolysis. SI joints preserved. 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 No acute abnormalities. Electronically Signed   By: Lavonia Dana M.D.   On: 02/13/2019 09:57    Procedures Procedures (including critical care time)  Medications Ordered in ED Medications  aspirin EC tablet 81 mg (81 mg Oral Given 02/13/19 1030)  benztropine (COGENTIN) tablet 1 mg (1 mg Oral Given 02/13/19 1042)  cloNIDine (CATAPRES) tablet 0.2 mg (0.2 mg Oral Given 02/13/19 1030)  fluticasone (FLONASE) 50 MCG/ACT nasal spray 1 spray (1 spray Each Nare Given 02/13/19 1036)  glipiZIDE (GLUCOTROL XL) 24 hr tablet 10 mg (has no administration in time range)  haloperidol (HALDOL) tablet 20 mg (has no administration in time range)  insulin glargine (LANTUS) injection 10 Units (10 Units Subcutaneous Given 02/13/19 1035)  lisinopril (ZESTRIL) tablet 40 mg (40 mg Oral Given 02/13/19 1030)  lithium carbonate (ESKALITH) CR tablet 450 mg (has no administration in time range)  lithium carbonate capsule 300 mg (300 mg Oral Given 02/13/19 1036)  metFORMIN (GLUCOPHAGE) tablet 1,000 mg (has no administration in time range)  traZODone (DESYREL) tablet 100 mg (has no administration in time range)  acetaminophen (TYLENOL) tablet 650 mg (650 mg Oral Given 02/13/19 1030)  ondansetron (ZOFRAN) tablet 4 mg (has no administration in time range)  nicotine (NICODERM CQ - dosed in mg/24 hours) patch 21 mg (21 mg Transdermal Patch Applied 02/13/19 1030)  insulin aspart (novoLOG) injection 5 Units (has no administration in time range)     Initial Impression / Assessment and Plan / ED Course  I have reviewed the triage vital signs and the nursing notes.  Pertinent labs & imaging results that were  available during my care of the patient were reviewed by me and considered in my medical decision making (see chart for details).        Patient's labs are reassuring besides mild hyperglycemia.  No acidosis.  Otherwise appears medically stable for psychiatric disposition.  Psychiatry has seen and recommends inpatient treatment at this time.  Fenton Foyavid H Egerton was evaluated in Emergency Department on 02/13/2019 for the symptoms described in the history of present illness. He was evaluated in the context of the global COVID-19 pandemic, which necessitated consideration that the patient might be at risk for infection with the SARS-CoV-2 virus that causes COVID-19. Institutional protocols and algorithms that pertain to the evaluation of patients at risk for COVID-19 are in a state of rapid change based on information released by regulatory bodies including the CDC and federal and state organizations. These policies and algorithms were followed during the patient's care in the ED.   Final Clinical Impressions(s) / ED Diagnoses   Final diagnoses:  Schizoaffective disorder, unspecified type [facility](HCC)  Hyperglycemia    ED Discharge Orders    None       Pricilla LovelessGoldston, Lyndzee Kliebert, MD 02/13/19 1544

## 2019-02-13 NOTE — ED Notes (Signed)
TTS in process 

## 2019-02-14 DIAGNOSIS — F259 Schizoaffective disorder, unspecified: Secondary | ICD-10-CM | POA: Diagnosis not present

## 2019-02-14 LAB — CBG MONITORING, ED: Glucose-Capillary: 131 mg/dL — ABNORMAL HIGH (ref 70–99)

## 2019-02-14 NOTE — ED Notes (Signed)
Breakfast Ordered 

## 2019-02-14 NOTE — Discharge Planning (Signed)
Plan to transfer to [facility] today.

## 2019-02-14 NOTE — ED Notes (Signed)
ED TO INPATIENT HANDOFF REPORT  ED Nurse Name and Phone #: Shahab Polhamus [phone]  S Name/Age/Gender Mitchell [name] 58 y.o. male Room/Bed: 050C/050C  Code Status   Code Status: Full Code  Home/SNF/Other metal health facility Patient oriented to: self, place and situation Is this baseline? Yes   Triage Complete: Triage complete  Chief Complaint High blood sugar  Triage Note Patient states that he is a diabetic and he has hyperglycemia.  Patient also endorses back pain.     Allergies No Known Allergies  Level of Care/Admitting Diagnosis ED Disposition    None      B Medical/Surgery History Past Medical History:  Diagnosis Date  . Bipolar affective disorder (HCC)    takes Zyprexa daily  . Diabetes mellitus    takes Victoza,Metformin,and Glipizide daily  . Hypertension    takes Amlodipine,Lisinopril and Clonidine daily  . Hyponatremia    history of  . Mental disorder    takes Lithium daily  . Schizoaffective disorder    takes Trazodone nightly  . Seasonal allergies    takes Claritin daily  . Sleep apnea    sleep study >477yrs ago  . Stroke [facility](HCC)    left arm weakness   Past Surgical History:  Procedure Laterality Date  . CATARACT EXTRACTION W/PHACO Right 02/14/2013   Procedure: CATARACT EXTRACTION PHACO AND INTRAOCULAR LENS PLACEMENT (IOC);  Surgeon: Shade FloodGreer Geiger, MD;  Location: [facility] OR;  Service: Ophthalmology;  Laterality: Right;  . CATARACT EXTRACTION W/PHACO Left 06/13/2013   Procedure: CATARACT EXTRACTION PHACO AND INTRAOCULAR LENS PLACEMENT (IOC);  Surgeon: Shade FloodGreer Geiger, MD;  [facility] OR;  Service: Ophthalmology;  Laterality: Left;  . CIRCUMCISION  20 yrs. ago  . EYE SURGERY       A IV Location/Drains/Wounds Patient Lines/Drains/Airways Status   Active Line/Drains/Airways    None          Intake/Output Last 24 hours  Intake/Output Summary (Last 24 hours) at 02/14/2019 0901 Last data filed at 02/14/2019 [id number] Gross per 24 hour  Intake 480 ml   Output -  Net 480 ml    Labs/Imaging Results for orders placed or performed during the hospital encounter of 02/13/19 (from the past 48 hour(s))  CBG monitoring, ED     Status: Abnormal   Collection Time: 02/13/19  2:12 AM  Result Value Ref Range   Glucose-Capillary 224 (H) 70 - 99 mg/dL  Urinalysis, Routine w reflex microscopic     Status: Abnormal   Collection Time: 02/13/19  2:45 AM  Result Value Ref Range   Color, Urine COLORLESS (A) YELLOW   APPearance CLEAR CLEAR   Specific Gravity, Urine 1.000 (L) 1.005 - 1.030   pH 6.0 5.0 - 8.0   Glucose, UA NEGATIVE NEGATIVE mg/dL   Hgb urine dipstick NEGATIVE NEGATIVE   Bilirubin Urine NEGATIVE NEGATIVE   Ketones, ur NEGATIVE NEGATIVE mg/dL   Protein, ur NEGATIVE NEGATIVE mg/dL   Nitrite NEGATIVE NEGATIVE   Leukocytes,Ua NEGATIVE NEGATIVE    Comment: Performed at [facility], [address] [place], [place] [address]  Basic metabolic panel     Status: Abnormal   Collection Time: 02/13/19  2:48 AM  Result Value Ref Range   Sodium 139 135 - 145 mmol/L   Potassium 3.9 3.5 - 5.1 mmol/L   Chloride 103 98 - 111 mmol/L   CO2 24 22 - 32 mmol/L   Glucose, Bld 247 (H) 70 - 99 mg/dL   BUN 11 6 - 20 mg/dL  Creatinine, Ser 1.05 0.61 - 1.24 mg/dL   Calcium 9.6 8.9 - 10.3 mg/dL   GFR calc non Af Amer >60 >60 mL/min   GFR calc Af Amer >60 >60 mL/min   Anion gap 12 5 - 15    Comment: Performed at [facility] [address] [place] [address]  CBC     Status: None   Collection Time: 02/13/19  2:48 AM  Result Value Ref Range   WBC 5.5 4.0 - 10.5 K/uL   RBC 4.31 4.22 - 5.81 MIL/uL   Hemoglobin 13.6 13.0 - 17.0 g/dL   HCT 41.2 39.0 - 52.0 %   MCV 95.6 80.0 - 100.0 fL   MCH 31.6 26.0 - 34.0 pg   MCHC 33.0 30.0 - 36.0 g/dL   RDW 12.9 11.5 - 15.5 %   Platelets 267 150 - 400 K/uL   nRBC 0.0 0.0 - 0.2 %    Comment: Performed at [facility], [place] [address], [place], [place] [address]  Lithium level     Status:  Abnormal   Collection Time: 02/13/19  9:46 AM  Result Value Ref Range   Lithium Lvl <0.06 (L) 0.60 - 1.20 mmol/L    Comment: Performed at [facility] [address], [place], [place] [address]  CBG monitoring, ED     Status: Abnormal   Collection Time: 02/13/19  9:48 AM  Result Value Ref Range   Glucose-Capillary 237 (H) 70 - 99 mg/dL   Comment 1 Notify RN    Comment 2 Document in Chart   SARS CORONAVIRUS 2 (TAT 6-24 HRS) Nasopharyngeal Nasopharyngeal Swab     Status: None   Collection Time: 02/13/19  1:32 PM   Specimen: Nasopharyngeal Swab  Result Value Ref Range   SARS Coronavirus 2 NEGATIVE NEGATIVE    Comment: (NOTE) SARS-CoV-2 target nucleic acids are NOT DETECTED. The SARS-CoV-2 RNA is generally detectable in upper and lower respiratory specimens during the acute phase of infection. Negative results do not preclude SARS-CoV-2 infection, do not rule out co-infections with other pathogens, and should not be used as the sole basis for treatment or other patient management decisions. Negative results must be combined with clinical observations, patient history, and epidemiological information. The expected result is Negative. Fact Sheet for Patients: SugarRoll.be Fact Sheet for Healthcare Providers: https://www.woods-mathews.com/ This test is not yet approved or cleared by the [place] FDA and  has been authorized for detection and/or diagnosis of SARS-CoV-2 by FDA under an Emergency Use Authorization (EUA). This EUA will remain  in effect (meaning this test can be used) for the duration of the COVID-19 declaration under Section 56 4(b)(1) of the Act, 21 U.S.C. section 360bbb-3(b)(1), unless the authorization is terminated or revoked sooner. Performed at [facility], [place] [address], [place], [place] [address]   CBG monitoring, ED     Status: Abnormal   Collection Time: 02/13/19  7:03 PM  Result Value Ref Range    Glucose-Capillary 135 (H) 70 - 99 mg/dL  CBG monitoring, ED     Status: Abnormal   Collection Time: 02/14/19  7:40 AM  Result Value Ref Range   Glucose-Capillary 131 (H) 70 - 99 mg/dL   Dg Lumbar Spine Complete  Result Date: 02/13/2019 CLINICAL DATA:  Back pain post fall on the sidewalk last night EXAM: LUMBAR SPINE - COMPLETE 4+ VIEW COMPARISON:  None FINDINGS: 5 non-rib-bearing lumbar vertebra. Minimal levoconvex scoliosis. Scattered small endplate spurs and  minor disc space narrowing throughout caudal lumbar spine. Facet degenerative changes lower lumbar spine. Vertebral body heights maintained without fracture or subluxation. No bone destruction or spondylolysis. SI joints preserved. 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 No acute abnormalities. Electronically Signed   By: Ulyses Southward M.D.   On: 02/13/2019 09:57    Pending Labs Unresulted Labs (From admission, onward)   None      Vitals/Pain Today's Vitals   02/13/19 1929 02/13/19 2208 02/14/19 0623 02/14/19 0815  BP:  122/78 (!) 98/59   Pulse:  72 64   Resp:  16 18   Temp:   98.1 F (36.7 C)   TempSrc:   Oral   SpO2:  100% 90%   PainSc: 0-No pain   0-No pain    Isolation Precautions No active isolations  Medications Medications  aspirin EC tablet 81 mg (81 mg Oral Given 02/13/19 1030)  benztropine (COGENTIN) tablet 1 mg (1 mg Oral Given 02/13/19 2134)  cloNIDine (CATAPRES) tablet 0.2 mg (0.2 mg Oral Given 02/13/19 2134)  fluticasone (FLONASE) 50 MCG/ACT nasal spray 1 spray (1 spray Each Nare Given 02/13/19 1036)  glipiZIDE (GLUCOTROL XL) 24 hr tablet 10 mg (10 mg Oral Given 02/14/19 0754)  haloperidol (HALDOL) tablet 20 mg (20 mg Oral Given 02/13/19 2134)  insulin glargine (LANTUS) injection 10 Units (10 Units Subcutaneous Given 02/13/19 1035)  lisinopril (ZESTRIL) tablet 40 mg (40 mg Oral Given 02/13/19 1030)  lithium carbonate (ESKALITH) CR tablet 450 mg (450 mg Oral Given  02/13/19 2134)  lithium carbonate capsule 300 mg (300 mg Oral Given 02/13/19 1036)  metFORMIN (GLUCOPHAGE) tablet 1,000 mg (1,000 mg Oral Given 02/14/19 0756)  traZODone (DESYREL) tablet 100 mg (100 mg Oral Given 02/13/19 2134)  acetaminophen (TYLENOL) tablet 650 mg (650 mg Oral Given 02/13/19 1030)  ondansetron (ZOFRAN) tablet 4 mg (has no administration in time range)  nicotine (NICODERM CQ - dosed in mg/24 hours) patch 21 mg (21 mg Transdermal Patch Removed 02/14/19 0955)  insulin aspart (novoLOG) injection 5 Units (5 Units Subcutaneous Given 02/14/19 0754)    Mobility walks Low fall risk   Focused Assessments psych assessment    R Recommendations: See Admitting Provider Note  Report given to:   Additional Notes:

## 2019-02-14 NOTE — ED Notes (Signed)
Patient was given a Snack and Drink. 

## 2019-02-14 NOTE — ED Notes (Signed)
Patient valuables retrieved from security and given to safe transport. Patient belongings given to [employer] as well as patient forms in folder, including emtala forms.  Patient verbalizes understanding of transfer. Opportunity for questioning and answers were provided.

## 2019-02-14 NOTE — ED Provider Notes (Signed)
Emergency Medicine Observation Re-evaluation Note  Mitchell [name] is a 58 y.o. male, seen on rounds today.  Pt initially presented to the ED for complaints of Hyperglycemia Currently, the patient is placed at [facility], and planning to transfer today for inpt psychiatric therapy. No overnight events per nursing.   Physical Exam  BP (!) 103/57 (BP Location: Left Arm)   Pulse 64   Temp 98.1 F (36.7 C) (Oral)   Resp 18   SpO2 98%  Physical Exam Vitals signs and nursing note reviewed.  Constitutional:      General: He is not in acute distress.    Appearance: He is well-developed.     Comments: Pt watching TV  HENT:     Head: Normocephalic and atraumatic.  Eyes:     Conjunctiva/sclera: Conjunctivae normal.  Cardiovascular:     Rate and Rhythm: Normal rate.  Pulmonary:     Effort: Pulmonary effort is normal.  Abdominal:     Palpations: Abdomen is soft.  Skin:    General: Skin is warm.  Neurological:     Mental Status: He is alert.  Psychiatric:        Behavior: Behavior normal.     ED Course / MDM  EKG:    I have reviewed the labs performed to date as well as medications administered while in observation.  Recent changes in the last 24 hours include placement at [facility]. Plan  Current plan is for transfer to [facility] today for inpt psychiatric therapy.    Robinson, [place] N, PA-C 02/14/19 Spry, [place], DO 02/14/19 1041

## 2019-07-05 ENCOUNTER — Encounter: Payer: Self-pay | Admitting: Physician Assistant

## 2020-01-10 ENCOUNTER — Ambulatory Visit (HOSPITAL_COMMUNITY)
Admission: EM | Admit: 2020-01-10 | Discharge: 2020-01-11 | Disposition: A | Discharge status: Home / Self Care | Payer: Medicare Other | Attending: Nurse Practitioner | Admitting: Nurse Practitioner

## 2020-01-10 ENCOUNTER — Other Ambulatory Visit: Payer: Self-pay

## 2020-01-10 DIAGNOSIS — E1165 Type 2 diabetes mellitus with hyperglycemia: Secondary | ICD-10-CM | POA: Diagnosis not present

## 2020-01-10 DIAGNOSIS — Z20822 Contact with and (suspected) exposure to covid-19: Secondary | ICD-10-CM | POA: Insufficient documentation

## 2020-01-10 DIAGNOSIS — Z79899 Other long term (current) drug therapy: Secondary | ICD-10-CM | POA: Diagnosis not present

## 2020-01-10 DIAGNOSIS — Z794 Long term (current) use of insulin: Secondary | ICD-10-CM | POA: Insufficient documentation

## 2020-01-10 DIAGNOSIS — G4730 Sleep apnea, unspecified: Secondary | ICD-10-CM | POA: Insufficient documentation

## 2020-01-10 DIAGNOSIS — F251 Schizoaffective disorder, depressive type: Secondary | ICD-10-CM | POA: Diagnosis present

## 2020-01-10 DIAGNOSIS — Z8673 Personal history of transient ischemic attack (TIA), and cerebral infarction without residual deficits: Secondary | ICD-10-CM | POA: Diagnosis not present

## 2020-01-10 DIAGNOSIS — Z7982 Long term (current) use of aspirin: Secondary | ICD-10-CM | POA: Insufficient documentation

## 2020-01-10 LAB — POCT URINE DRUG SCREEN - MANUAL ENTRY (I-SCREEN)
POC Amphetamine UR: NOT DETECTED
POC Buprenorphine (BUP): NOT DETECTED
POC Cocaine UR: NOT DETECTED
POC Marijuana UR: NOT DETECTED
POC Methadone UR: NOT DETECTED
POC Methamphetamine UR: NOT DETECTED
POC Morphine: NOT DETECTED
POC Oxazepam (BZO): NOT DETECTED
POC Oxycodone UR: NOT DETECTED
POC Secobarbital (BAR): NOT DETECTED

## 2020-01-10 LAB — GLUCOSE, CAPILLARY: Glucose-Capillary: 401 mg/dL — ABNORMAL HIGH (ref 70–99)

## 2020-01-10 LAB — POC SARS CORONAVIRUS 2 AG -  ED: SARS Coronavirus 2 Ag: NEGATIVE

## 2020-01-10 MED ORDER — MAGNESIUM HYDROXIDE 400 MG/5ML PO SUSP: 30.0000 mL | Freq: Every day | ORAL | Status: DC | PRN

## 2020-01-10 MED ORDER — BENZTROPINE MESYLATE 1 MG PO TABS
1.0000 mg | ORAL_TABLET | Freq: Two times a day (BID) | ORAL | Status: DC
  Administered 2020-01-11: 1 mg via ORAL
  Filled 2020-01-10: qty 1

## 2020-01-10 MED ORDER — AMLODIPINE BESYLATE 5 MG PO TABS
5.0000 mg | ORAL_TABLET | Freq: Every day | ORAL | Status: DC
  Administered 2020-01-11: 5 mg via ORAL
  Filled 2020-01-10: qty 1

## 2020-01-10 MED ORDER — ACETAMINOPHEN 325 MG PO TABS: 650.0000 mg | ORAL_TABLET | Freq: Four times a day (QID) | ORAL | Status: DC | PRN

## 2020-01-10 MED ORDER — INSULIN DETEMIR 100 UNIT/ML ~~LOC~~ SOLN
20.0000 [IU] | Freq: Every day | SUBCUTANEOUS | Status: DC
  Administered 2020-01-10: 20 [IU] via SUBCUTANEOUS

## 2020-01-10 MED ORDER — LITHIUM CARBONATE ER 450 MG PO TBCR
450.0000 mg | EXTENDED_RELEASE_TABLET | Freq: Two times a day (BID) | ORAL | Status: DC
  Administered 2020-01-11: 450 mg via ORAL
  Filled 2020-01-10: qty 1

## 2020-01-10 MED ORDER — INSULIN ASPART 100 UNIT/ML ~~LOC~~ SOLN
5.0000 [IU] | Freq: Once | SUBCUTANEOUS | Status: AC
  Administered 2020-01-10: 5 [IU] via SUBCUTANEOUS

## 2020-01-10 MED ORDER — ALUM & MAG HYDROXIDE-SIMETH 200-200-20 MG/5ML PO SUSP: 30.0000 mL | ORAL | Status: DC | PRN

## 2020-01-10 MED ORDER — HALOPERIDOL 5 MG PO TABS: 20.0000 mg | ORAL_TABLET | Freq: Every day | ORAL | Status: DC

## 2020-01-10 MED ORDER — INSULIN ASPART 100 UNIT/ML ~~LOC~~ SOLN
0.0000 [IU] | Freq: Three times a day (TID) | SUBCUTANEOUS | Status: DC
  Administered 2020-01-11: 6 [IU] via SUBCUTANEOUS

## 2020-01-10 MED ORDER — INSULIN ASPART 100 UNIT/ML ~~LOC~~ SOLN
5.0000 [IU] | Freq: Three times a day (TID) | SUBCUTANEOUS | Status: DC
  Administered 2020-01-11: 5 [IU] via SUBCUTANEOUS

## 2020-01-10 MED ORDER — TRAZODONE HCL 50 MG PO TABS: 50.0000 mg | ORAL_TABLET | Freq: Every evening | ORAL | Status: DC | PRN

## 2020-01-10 MED ORDER — HYDROXYZINE HCL 25 MG PO TABS: 25.0000 mg | ORAL_TABLET | Freq: Three times a day (TID) | ORAL | Status: DC | PRN

## 2020-01-10 MED ORDER — METFORMIN HCL 500 MG PO TABS
1000.0000 mg | ORAL_TABLET | Freq: Two times a day (BID) | ORAL | Status: DC
  Administered 2020-01-11: 1000 mg via ORAL
  Filled 2020-01-10: qty 2

## 2020-01-10 MED ORDER — ASPIRIN EC 81 MG PO TBEC
81.0000 mg | DELAYED_RELEASE_TABLET | Freq: Every day | ORAL | Status: DC
  Administered 2020-01-11: 81 mg via ORAL
  Filled 2020-01-10: qty 1

## 2020-01-10 NOTE — ED Notes (Signed)
CBG  401 

## 2020-01-10 NOTE — ED Provider Notes (Signed)
Behavioral Health Admission [facility](FBC & OBS)  Date: 01/11/20 Patient Name: Mitchell [name] MRN: [id number] Chief Complaint:  Chief Complaint  Patient presents with   Schizophrenia   Chief Complaint/Presenting Problem: Pt stated, the singer Mitchell [name] is his mother and people have been breaking in his house trying to steal his legacy. Therefore, he does not feel safe at home. Pt denied, any active SI, HI, AVH and access to means.  Diagnoses:  Final diagnoses:  Schizoaffective disorder, depressive type (HCC)  Hyperglycemia due to diabetes mellitus [facility](HCC)    HPI: Mitchell MeyerDavid [name] is a 59 y.o. male with a history of schizoaffective disorder, borderline intelectual disorder, diabetes mellitus, and HTN  who [facility]. Patient states "People have been trying to break down my door to take my heritage. 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 His speech is clear, normal pace and volume, but disorganized and irrelevant. He reports feeling depressed and anxious. He denies any suicidal ideation. Denies homicidal ideations. He denies auditory and visual hallucinations. He does not appear to be responding to internal stimuli.   Patient states that he receives psychiatric care at [facility] He states that he receives a monthly injection and that he last received the injection this afternoon. He is not able to recall the name of the medication. According to notes, he has previously taken paliperidone 156 mg monthly. [facility] and at [facility].   On chart review, it appears that his delusional thought content is consistent with previous assessments and he may be at his baseline.  However, he is paranoid that people are trying to break into his home. He is agreeable to staying  overnight for continuous assessment.  PHQ 2-9:     [facility] Ad[facility] (Discharged[facility]  C-SSRS RISK CATEGORY High Risk Error: Q3, 4, or 5 should not be populated when Q2 is No No Risk       Total Time spent with patient: 30 minutes  Musculoskeletal  Strength & Muscle Tone: within normal limits Gait & Station: normal Patient leans: N/A  Psychiatric Specialty Exam  Presentation General Appearance: Fairly Groomed  Eye Contact:Fair  Speech:Clear and Coherent;Normal Rate  Speech Volume:Normal  Handedness:Right   Mood and Affect  Mood:Anxious;Depressed  Affect:Appropriate   Thought Process  Thought Processes:Irrevelant  Descriptions of Associations:Loose  Orientation:Full (Time, Place and Person)  Thought Content:Delusions  Hallucinations:Hallucinations: None  Ideas of Reference:Delusions;Paranoia  Suicidal Thoughts:Suicidal Thoughts: No  Homicidal Thoughts:Homicidal Thoughts: No   Sensorium  Memory:Immediate Poor;Recent Poor;Remote Poor  Judgment:Impaired  Insight:Lacking   Executive Functions  Concentration:Good  Attention Span:Good  Recall:Fair  Fund of Knowledge:Fair  Language:Good   Psychomotor Activity  Psychomotor Activity:Psychomotor Activity: Normal   Assets  Assets:Financial Resources/Insurance;Housing;Physical Health;Social Support   Sleep  Sleep:Sleep: Fair   Physical Exam Constitutional:      General: He is not in acute distress.    Appearance: He is not ill-appearing, toxic-appearing or diaphoretic.  HENT:     Head: Normocephalic.     Right Ear: External ear normal.     Left Ear: External ear normal.  Eyes:  Pupils: Pupils are equal, round, and reactive to light.  Cardiovascular:     Rate and Rhythm: Normal rate.   Pulmonary:     Effort: Pulmonary effort is normal. No respiratory distress.  Musculoskeletal:        General: Normal range of motion.  Skin:    General: Skin is warm and dry.  Neurological:     Mental Status: He is alert and oriented to person, place, and time.  Psychiatric:        Mood and Affect: Mood is anxious and depressed.        Speech: Speech normal.        Behavior: Behavior is cooperative.        Thought Content: Thought content is paranoid and delusional. Thought content does not include homicidal or suicidal ideation. Thought content does not include suicidal plan.    Review of Systems  Constitutional: Negative for chills, diaphoresis, fever, malaise/fatigue and weight loss.  HENT: Negative for congestion.   Respiratory: Negative for cough and shortness of breath.   Cardiovascular: Negative for chest pain and palpitations.  Gastrointestinal: Negative for diarrhea, nausea and vomiting.  Neurological: Negative for dizziness and seizures.  Psychiatric/Behavioral: Positive for depression. Negative for hallucinations, memory loss, substance abuse and suicidal ideas. The patient is nervous/anxious and has insomnia.   All other systems reviewed and are negative.   Blood pressure (!) 168/97, pulse 76, temperature 98.1 F (36.7 C), temperature source Oral, resp. rate 20, height 5\' 4"  (1.626 m), weight 210 lb (95.3 kg), SpO2 97 %. Body mass index is 36.05 kg/m.  Past Psychiatric History: Schizoaffective disorder, borderline intelectual disorder. Last inpatient [facility] 12/2018.  Is the patient at risk to self? No  Has the patient been a risk to self in the past 6 months? No .    Has the patient been a risk to self within the distant past? No   Is the patient a risk to others? No   Has the patient been a risk to others in the past 6 months? No   Has the patient been a risk to others within the distant past? No   Past Medical History:  Past Medical History:  Diagnosis  Date   Bipolar affective disorder (HCC)    takes Zyprexa daily   Diabetes mellitus    takes Victoza,Metformin,and Glipizide daily   Hypertension    takes Amlodipine,Lisinopril and Clonidine daily   Hyponatremia    history of   Mental disorder    takes Lithium daily   Schizoaffective disorder    takes Trazodone nightly   Seasonal allergies    takes Claritin daily   Sleep apnea    sleep study >48yrs ago   Stroke [facility])    left arm weakness    Past Surgical History:  Procedure Laterality Date   CATARACT EXTRACTION W/PHACO Right 02/14/2013   Procedure: CATARACT EXTRACTION PHACO AND INTRAOCULAR LENS PLACEMENT (IOC);  Surgeon: 02/16/2013, MD;  Location: [facility] OR;  Service: Ophthalmology;  Laterality: Right;   CATARACT EXTRACTION W/PHACO Left 06/13/2013   Procedure: CATARACT EXTRACTION PHACO AND INTRAOCULAR LENS PLACEMENT (IOC);  Surgeon: 06/15/2013, MD;  Location: [facility] OR;  Service: Ophthalmology;  Laterality: Left;   CIRCUMCISION  20 yrs. ago   EYE SURGERY      Family History: History reviewed. No pertinent family history.  Social History:  Social History   Socioeconomic History   Marital status: Divorced    Spouse name: Not on file  Number of children: Not on file   Years of education: Not on file   Highest education level: Not on file  Occupational History   Not on file  Tobacco Use   Smoking status: Never Smoker   Smokeless tobacco: Never Used  Vaping Use   Vaping Use: Never used  Substance and Sexual Activity   Alcohol use: Yes    Comment: "a whole lot of wine every day"   Drug use: No   Sexual activity: Yes    Birth control/protection: None  Other Topics Concern   Not on file  Social History Narrative   Not on file   Social Determinants of Health   Financial Resource Strain:    Difficulty of Paying Living Expenses: Not on file  Food Insecurity:    Worried About Programme researcher, broadcasting/film/video in the Last Year: Not on file   [employer] of  Food in the Last Year: Not on file  Transportation Needs:    Lack of Transportation (Medical): Not on file   Lack of Transportation (Non-Medical): Not on file  Physical Activity:    Days of Exercise per Week: Not on file   Minutes of Exercise per Session: Not on file  Stress:    Feeling of Stress : Not on file  Social Connections:    Frequency of Communication with Friends and Family: Not on file   Frequency of Social Gatherings with Friends and Family: Not on file   Attends Religious Services: Not on file   Active Member of Clubs or Organizations: Not on file   Attends Banker Meetings: Not on file   Marital Status: Not on file  Intimate Partner Violence:    Fear of Current or Ex-Partner: Not on file   Emotionally Abused: Not on file   Physically Abused: Not on file   Sexually Abused: Not on file    SDOH:  SDOH Screenings   Alcohol Screen:    Last Alcohol Screening Score (AUDIT): Not on file  Depression (PHQ2-9):    PHQ-2 Score: Not on file  Financial Resource Strain:    Difficulty of Paying Living Expenses: Not on file  Food Insecurity:    Worried About Programme researcher, broadcasting/film/video in the Last Year: Not on file   [employer] of Food in the Last Year: Not on file  Housing:    Last Housing Risk Score: Not on file  Physical Activity:    Days of Exercise per Week: Not on file   Minutes of Exercise per Session: Not on file  Social Connections:    Frequency of Communication with Friends and Family: Not on file   Frequency of Social Gatherings with Friends and Family: Not on file   Attends Religious Services: Not on file   Active Member of Clubs or Organizations: Not on file   Attends Banker Meetings: Not on file   Marital Status: Not on file  Stress:    Feeling of Stress : Not on file  Tobacco Use: Low Risk    Smoking Tobacco Use: Never Smoker   Smokeless Tobacco Use: Never Used  Transportation Needs:    Automotive engineer (Medical): Not on file   Lack of Transportation (Non-Medical): Not on file    Last Labs:  Admission on 01/10/2020  Component Date Value Ref Range Status   SARS Coronavirus 2 by RT PCR 01/10/2020 NEGATIVE  NEGATIVE Final   Comment: (NOTE) SARS-CoV-2 target nucleic acids are NOT DETECTED.  The SARS-CoV-2 RNA is generally detectable in upper respiratoy specimens during the acute phase of infection. The lowest concentration of SARS-CoV-2 viral copies this assay can detect is 131 copies/mL. A negative result does not preclude SARS-Cov-2 infection and should not be used as the sole basis for treatment or other patient management decisions. A negative result may occur with  improper specimen collection/handling, submission of specimen other than nasopharyngeal swab, presence of viral mutation(s) within the areas targeted by this assay, and inadequate number of viral copies (<131 copies/mL). A negative result must be combined with clinical observations, patient history, and epidemiological information. The expected result is Negative.  Fact Sheet for Patients:  https://www.moore.com/  Fact Sheet for Healthcare Providers:  https://www.young.biz/  This test is no                          t yet approved or cleared by the [place] FDA and  has been authorized for detection and/or diagnosis of SARS-CoV-2 by FDA under an Emergency Use Authorization (EUA). This EUA will remain  in effect (meaning this test can be used) for the duration of the COVID-19 declaration under Section 564(b)(1) of the Act, 21 U.S.C. section 360bbb-3(b)(1), unless the authorization is terminated or revoked sooner.     Influenza A by PCR 01/10/2020 NEGATIVE  NEGATIVE Final   Influenza B by PCR 01/10/2020 NEGATIVE  NEGATIVE Final   Comment: (NOTE) The Xpert Xpress SARS-CoV-2/FLU/RSV assay is intended as an aid in  the diagnosis of influenza from  Nasopharyngeal swab specimens and  should not be used as a sole basis for treatment. Nasal washings and  aspirates are unacceptable for Xpert Xpress SARS-CoV-2/FLU/RSV  testing.  Fact Sheet for Patients: https://www.moore.com/  Fact Sheet for Healthcare Providers: https://www.young.biz/  This test is not yet approved or cleared by the [place] FDA and  has been authorized for detection and/or diagnosis of SARS-CoV-2 by  FDA under an Emergency Use Authorization (EUA). This EUA will remain  in effect (meaning this test can be used) for the duration of the  Covid-19 declaration under Section 564(b)(1) of the Act, 21  U.S.C. section 360bbb-3(b)(1), unless the authorization is  terminated or revoked. Performed at [facility], [address] [place], [place] [address]    SARS Coronavirus 2 Ag 01/10/2020 Negative  Negative Preliminary   WBC 01/10/2020 7.7  4.0 - 10.5 K/uL Final   RBC 01/10/2020 4.30  4.22 - 5.81 MIL/uL Final   Hemoglobin 01/10/2020 13.3  13.0 - 17.0 g/dL Final   HCT 40/98/1191 40.8  39 - 52 % Final   MCV 01/10/2020 94.9  80.0 - 100.0 fL Final   MCH 01/10/2020 30.9  26.0 - 34.0 pg Final   MCHC 01/10/2020 32.6  30.0 - 36.0 g/dL Final   RDW 47/82/9562 13.2  11.5 - 15.5 % Final   Platelets 01/10/2020 207  150 - 400 K/uL Final   nRBC 01/10/2020 0.0  0.0 - 0.2 % Final   Neutrophils Relative % 01/10/2020 66  % Final   Neutro Abs 01/10/2020 5.0  1.7 - 7.7 K/uL Final   Lymphocytes Relative 01/10/2020 25  % Final   Lymphs Abs 01/10/2020 1.9  0.7 - 4.0 K/uL Final   Monocytes Relative 01/10/2020 5  % Final   Monocytes Absolute 01/10/2020 0.4  0.1 - 1.0 K/uL Final   Eosinophils Relative 01/10/2020 4  % Final   Eosinophils Absolute 01/10/2020 0.3  0.0 - 0.5  K/uL Final   Basophils Relative 01/10/2020 0  % Final   Basophils Absolute 01/10/2020 0.0  0.0 - 0.1 K/uL Final   Immature Granulocytes 01/10/2020 0  %  Final   Abs Immature Granulocytes 01/10/2020 0.02  0.00 - 0.07 K/uL Final   Performed at [facility], [address] [place], [place] [address]   Sodium 01/10/2020 130* 135 - 145 mmol/L Final   Potassium 01/10/2020 4.0  3.5 - 5.1 mmol/L Final   Chloride 01/10/2020 95* 98 - 111 mmol/L Final   CO2 01/10/2020 24  22 - 32 mmol/L Final   Glucose, Bld 01/10/2020 380* 70 - 99 mg/dL Final   Glucose reference range applies only to samples taken after fasting for at least 8 hours.   BUN 01/10/2020 16  6 - 20 mg/dL Final   Creatinine, Ser 01/10/2020 1.19  0.61 - 1.24 mg/dL Final   Calcium 82/95/6213 9.5  8.9 - 10.3 mg/dL Final   Total Protein 08/65/7846 7.3  6.5 - 8.1 g/dL Final   Albumin 96/29/5284 3.8  3.5 - 5.0 g/dL Final   AST 13/24/4010 63* 15 - 41 U/L Final   ALT 01/10/2020 92* 0 - 44 U/L Final   Alkaline Phosphatase 01/10/2020 163* 38 - 126 U/L Final   Total Bilirubin 01/10/2020 0.6  0.3 - 1.2 mg/dL Final   GFR, Estimated 01/10/2020 >60  >60 mL/min Final   Anion gap 01/10/2020 11  5 - 15 Final   Performed at [facility], [address] [place], [place] [address]   Lithium Lvl 01/10/2020 0.53* 0.60 - 1.20 mmol/L Final   Performed at [facility], [address] [place], [place] [address]   POC Amphetamine UR 01/10/2020 None Detected  None Detected Preliminary   POC Secobarbital (BAR) 01/10/2020 None Detected  None Detected Preliminary   POC Buprenorphine (BUP) 01/10/2020 None Detected  None Detected Preliminary   POC Oxazepam (BZO) 01/10/2020 None Detected  None Detected Preliminary   POC Cocaine UR 01/10/2020 None Detected  None Detected Preliminary   POC Methamphetamine UR 01/10/2020 None Detected  None Detected Preliminary   POC Morphine 01/10/2020 None Detected  None Detected Preliminary   POC Oxycodone UR 01/10/2020 None Detected  None Detected Preliminary   POC Methadone UR 01/10/2020 None Detected  None Detected Preliminary   POC  Marijuana UR 01/10/2020 None Detected  None Detected Preliminary   Glucose-Capillary 01/10/2020 401* 70 - 99 mg/dL Final   Glucose reference range applies only to samples taken after fasting for at least 8 hours.    Allergies: Patient has no known allergies.  Prior to Admission medications   Medication Sig Start Date End Date Taking? Authorizing Provider  amLODipine (NORVASC) 5 MG tablet Take 5 mg by mouth daily. 12/28/19  Yes [provider]  lactulose (CHRONULAC) 10 GM/15ML solution Take 30 mLs by mouth daily as needed. 12/28/19  Yes [provider]  LEVEMIR FLEXTOUCH 100 UNIT/ML FlexPen Inject 20 Units into the skin at bedtime. 12/28/19  Yes [provider]  NOVOLOG FLEXPEN 100 UNIT/ML FlexPen Inject 5 Units into the skin with breakfast, with lunch, and with evening meal. 12/28/19  Yes [provider]  amLODipine (NORVASC) 10 MG tablet Take 1 tablet (10 mg total) by mouth daily. Patient not taking: Reported on 01/20/2019 01/11/19   Malvin Johns, MD  aspirin 81 MG EC tablet Take 1 tablet (81 mg total) by mouth daily. Swallow whole. 01/11/19   Malvin Johns, MD  benztropine (COGENTIN) 1 MG tablet Take 1 tablet (1 mg total) by mouth 2 (two) times daily. 01/10/19   Malvin Johns, MD  cloNIDine (CATAPRES) 0.2 MG tablet Take 0.2 mg by mouth 2 (two) times daily.    [provider]  fluticasone (FLONASE) 50 MCG/ACT nasal spray Place 1 spray into both nostrils daily.    [provider]  glipiZIDE (GLUCOTROL XL) 10 MG 24 hr tablet Take 1 tablet (10 mg total) by mouth daily with breakfast. 01/11/19   Malvin Johns, MD  haloperidol (HALDOL) 20 MG tablet Take 1 tablet (20 mg total) by mouth at bedtime. 01/10/19   Malvin Johns, MD  insulin glargine (LANTUS) 100 UNIT/ML injection Inject 10 Units into the skin daily.    [provider]  insulin lispro (HUMALOG) 100 UNIT/ML KwikPen Inject 2-10 Units into the skin 3 (three) times daily. Per sliding  scale 09/25/18   [provider]  liraglutide (VICTOZA) 18 MG/3ML SOPN Inject 1.8 mg into the skin daily.    [provider]  lisinopril (ZESTRIL) 40 MG tablet Take 1 tablet (40 mg total) by mouth daily. 01/11/19   Malvin Johns, MD  lithium carbonate (ESKALITH) 450 MG CR tablet Take 1 tablet (450 mg total) by mouth every 12 (twelve) hours. Patient taking differently: Take 450 mg by mouth at bedtime.  01/10/19   Malvin Johns, MD  lithium carbonate 300 MG capsule Take 300 mg by mouth every morning. 09/20/18   [provider]  metFORMIN (GLUCOPHAGE) 1000 MG tablet Take 1 tablet (1,000 mg total) by mouth 2 (two) times daily with a meal. 01/10/19   Malvin Johns, MD  traZODone (DESYREL) 100 MG tablet Take 100 mg by mouth at bedtime. 09/27/18   [provider]    Medical Decision Making  Admission labs ordered  Continue home medications-will order medications that were most recently filled on 12/28/2019. Patient reports that he took his lithium, haldol, and cogentin prior to coming to [facility].  Medications  acetaminophen (TYLENOL) tablet 650 mg (has no administration in time range)  alum & mag hydroxide-simeth (MAALOX/MYLANTA) 200-200-20 MG/5ML suspension 30 mL (has no administration in time range)  magnesium hydroxide (MILK OF MAGNESIA) suspension 30 mL (has no administration in time range)  hydrOXYzine (ATARAX/VISTARIL) tablet 25 mg (has no administration in time range)  traZODone (DESYREL) tablet 50 mg (has no administration in time range)  amLODipine (NORVASC) tablet 5 mg (0 mg Oral Hold 01/10/20 2247)  aspirin EC tablet 81 mg (0 mg Oral Hold 01/10/20 2247)  benztropine (COGENTIN) tablet 1 mg (0 mg Oral Hold 01/10/20 2248)  haloperidol (HALDOL) tablet 20 mg (0 mg Oral Hold 01/10/20 2248)  lithium carbonate (ESKALITH) CR tablet 450 mg (0 mg Oral Hold 01/10/20 2248)  metFORMIN (GLUCOPHAGE) tablet 1,000 mg (has no administration in time range)  insulin detemir  (LEVEMIR) injection 20 Units (20 Units Subcutaneous Given 01/10/20 2341)  insulin aspart (novoLOG) injection 5 Units (has no administration in time range)  insulin aspart (novoLOG) injection 0-6 Units (has no administration in time range)  insulin aspart (novoLOG) injection 5 Units (5 Units Subcutaneous Given 01/10/20 2339)     Clinical Course as of Jan 10 205  Thu Jan 10, 2020  2218 UDS negative  POCT Urine Drug Screen - (ICup) [JB]  2250 CBG 401-will give 5 units novolog  Glucose-Capillary(!): 401 [JB]  Fri Jan 11, 2020  0151 Sodium 130, corrected sodium considering hyperglycemia is 134-137 per MDCalc   Sodium(!): 130 [JB]  0154  CBC unremarkable  CBC with Differential/Platelet [JB]  0155 Lithium slightly below therapeutic range. Unclear if patient is consistently taking lithium  Lithium(!): 0.53 [JB]  0205 AST(!): 63 [JB]  0205 AST and ALT elevated. Recommend that patient follow up with primary care provider  ALT(!): 92 [JB]    Clinical Course User Index [JB] Jackelyn Poling, NP    Recommendations  Based on my evaluation the patient does not appear to have an emergency medical condition.   Patient will be placed in the continuous assessment area at [facility] for treatment and stabilization. He will be reevaluated on 01/11/2020. The treatment team will determine disposition at that time.      Jackelyn Poling, NP 01/11/20  2:06 AM
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
## 2020-01-10 NOTE — ED Notes (Signed)
Pt belongings in locker #29. 

## 2020-01-11 ENCOUNTER — Encounter (HOSPITAL_COMMUNITY): Payer: Self-pay | Admitting: Emergency Medicine

## 2020-01-11 ENCOUNTER — Other Ambulatory Visit: Payer: Self-pay

## 2020-01-11 DIAGNOSIS — F251 Schizoaffective disorder, depressive type: Secondary | ICD-10-CM | POA: Diagnosis not present

## 2020-01-11 LAB — COMPREHENSIVE METABOLIC PANEL
ALT: 92 U/L — ABNORMAL HIGH (ref 0–44)
AST: 63 U/L — ABNORMAL HIGH (ref 15–41)
Albumin: 3.8 g/dL (ref 3.5–5.0)
Alkaline Phosphatase: 163 U/L — ABNORMAL HIGH (ref 38–126)
Anion gap: 11 (ref 5–15)
BUN: 16 mg/dL (ref 6–20)
CO2: 24 mmol/L (ref 22–32)
Calcium: 9.5 mg/dL (ref 8.9–10.3)
Chloride: 95 mmol/L — ABNORMAL LOW (ref 98–111)
Creatinine, Ser: 1.19 mg/dL (ref 0.61–1.24)
GFR, Estimated: >60 mL/min (ref >60)
Glucose, Bld: 380 mg/dL — ABNORMAL HIGH (ref 70–99)
Potassium: 4 mmol/L (ref 3.5–5.1)
Sodium: 130 mmol/L — ABNORMAL LOW (ref 135–145)
Total Bilirubin: 0.6 mg/dL (ref 0.3–1.2)
Total Protein: 7.3 g/dL (ref 6.5–8.1)

## 2020-01-11 LAB — CBC WITH DIFFERENTIAL/PLATELET
Abs Immature Granulocytes: 0.02 10*3/uL (ref 0.00–0.07)
Basophils Absolute: 0 10*3/uL (ref 0.0–0.1)
Basophils Relative: 0 %
Eosinophils Absolute: 0.3 10*3/uL (ref 0.0–0.5)
Eosinophils Relative: 4 %
HCT: 40.8 % (ref 39.0–52.0)
Hemoglobin: 13.3 g/dL (ref 13.0–17.0)
Immature Granulocytes: 0 %
Lymphocytes Relative: 25 %
Lymphs Abs: 1.9 10*3/uL (ref 0.7–4.0)
MCH: 30.9 pg (ref 26.0–34.0)
MCHC: 32.6 g/dL (ref 30.0–36.0)
MCV: 94.9 fL (ref 80.0–100.0)
Monocytes Absolute: 0.4 10*3/uL (ref 0.1–1.0)
Monocytes Relative: 5 %
Neutro Abs: 5 10*3/uL (ref 1.7–7.7)
Neutrophils Relative %: 66 %
Platelets: 207 10*3/uL (ref 150–400)
RBC: 4.3 MIL/uL (ref 4.22–5.81)
RDW: 13.2 % (ref 11.5–15.5)
WBC: 7.7 10*3/uL (ref 4.0–10.5)
nRBC: 0 % (ref 0.0–0.2)

## 2020-01-11 LAB — RESPIRATORY PANEL BY RT PCR (FLU A&B, COVID)
Influenza A by PCR: NEGATIVE
Influenza B by PCR: NEGATIVE
SARS Coronavirus 2 by RT PCR: NEGATIVE

## 2020-01-11 LAB — LITHIUM LEVEL: Lithium Lvl: 0.53 mmol/L — ABNORMAL LOW (ref 0.60–1.20)

## 2020-01-11 LAB — GLUCOSE, CAPILLARY: Glucose-Capillary: 416 mg/dL — ABNORMAL HIGH (ref 70–99)

## 2020-01-11 NOTE — ED Notes (Signed)
Pt up @this  time siting up in chair drinking water. Will continue to monitor pt

## 2020-01-11 NOTE — ED Notes (Signed)
Pt discharged in no acute distress. Denied SI/HI. Verbalized understanding of AVS explained by writer via teach back method concerning when to call for help and following up with primary provider for assistance or [facility]. Safety maintained.

## 2020-01-11 NOTE — ED Provider Notes (Signed)
[facility]BC/OBS ASAP Discharge Summary  Date and Time: 01/11/2020 10:39 AM  Name: Mitchell [name]  MRN:  [id number]   Discharge Diagnoses:  Final diagnoses:  Schizoaffective disorder, depressive type (HCC)  Hyperglycemia due to diabetes mellitus (HCC)    Subjective: Patient presents today sitting up and is very pleasant, calm, cooperative.  Patient denies having any suicidal homicidal ideations and denies any hallucinations.  Patient reports that he was the one that contacted the police to come in because he was tired of being harassed by others around him.  He states that he does not really hang out with anyone in his neighborhood and that they all keep coming around and bothering him.  He states that they have told him that he is gay and that he is going to marry a man and that people will come around his house messing with him sometimes.  He makes a statement that I can contact the police and asked them because they are very aware of it.  Patient then smiles and goes they call me "the Leona Singleton of the police department."  He reports that he does a lot of the police officers and they are pretty friendly with him and some of them he would consider friends.  Patient is smiling and laughing while he was telling me the story about being the Abrol the police department.  Patient states that he feels that he is ready to go home and that he is followed by [employer].  Patient states that he is compliant with his medications and has no issues at home.  Stay Summary: Patient is a 59 year old male with a history of schizoaffective disorder and previous hospitalizations.  Patient presented to the [facility] voluntarily with [place] [facility].  Patient had reported that he had contacted them to bring him to the hospital.  When the patient first arrived he appeared to be delusional making comments about bilateral about being his mother and that his real name is Mitchell [name].  Patient did make a comment about being the Abrol of the police  department.  Patient was admitted to continuous observation and observed overnight.  Patient was restarted on his medications.  Today the patient had denied any suicidal or homicidal ideations and denies any hallucinations.  Patient's conversation is much more logical than reported.  Patient states that he knows where he lives and he takes care of himself.  Patient reports that he is compliant with his medications.  Patient does not appear to be a threat to himself or to anyone else.  His comment about being the Abrol of the police station today seems to be as a joking matter is in a story that he shares with his friends at the police department.  Patient does not appear to be responding to any internal or external stimuli.  Patient has not been aggressive nor threatening.  Patient was discharged home and transported via safe transport.  Patient was informed to follow-up with his current outpatient provider and to continue his current outpatient medications as prescribed.  Total Time spent with patient: 20 minutes  Past Psychiatric History: Bipolar affective disorder, schizoaffective disorder, unremarkable previous hospitalizations Past Medical History:  Past Medical History:  Diagnosis Date   Bipolar affective disorder (HCC)    takes Zyprexa daily   Diabetes mellitus    takes Victoza,Metformin,and Glipizide daily   Hypertension    takes Amlodipine,Lisinopril and Clonidine daily   Hyponatremia    history of   Mental disorder  takes Lithium daily   Schizoaffective disorder    takes Trazodone nightly   Seasonal allergies    takes Claritin daily   Sleep apnea    sleep study >31yrs ago   Stroke [facility])    left arm weakness    Past Surgical History:  Procedure Laterality Date   CATARACT EXTRACTION W/PHACO Right 02/14/2013   Procedure: CATARACT EXTRACTION PHACO AND INTRAOCULAR LENS PLACEMENT (IOC);  Surgeon: Shade Flood, MD;  Location: [facility] OR;  Service: Ophthalmology;  Laterality:  Right;   CATARACT EXTRACTION W/PHACO Left 06/13/2013   Procedure: CATARACT EXTRACTION PHACO AND INTRAOCULAR LENS PLACEMENT (IOC);  Surgeon: Shade Flood, MD;  Location: [facility] OR;  Service: Ophthalmology;  Laterality: Left;   CIRCUMCISION  20 yrs. ago   EYE SURGERY     Family History: History reviewed. No pertinent family history. Family Psychiatric History: None reported Social History:  Social History   Substance and Sexual Activity  Alcohol Use Yes   Comment: "a whole lot of wine every day"     Social History   Substance and Sexual Activity  Drug Use No    Social History   Socioeconomic History   Marital status: Divorced    Spouse name: Not on file   Number of children: Not on file   Years of education: Not on file   Highest education level: Not on file  Occupational History   Not on file  Tobacco Use   Smoking status: Never Smoker   Smokeless tobacco: Never Used  Vaping Use   Vaping Use: Never used  Substance and Sexual Activity   Alcohol use: Yes    Comment: "a whole lot of wine every day"   Drug use: No   Sexual activity: Yes    Birth control/protection: None  Other Topics Concern   Not on file  Social History Narrative   Not on file   Social Determinants of Health   Financial Resource Strain:    Difficulty of Paying Living Expenses: Not on file  Food Insecurity:    Worried About Programme researcher, broadcasting/film/video in the Last Year: Not on file   [employer] of Food in the Last Year: Not on file  Transportation Needs:    Lack of Transportation (Medical): Not on file   Lack of Transportation (Non-Medical): Not on file  Physical Activity:    Days of Exercise per Week: Not on file   Minutes of Exercise per Session: Not on file  Stress:    Feeling of Stress : Not on file  Social Connections:    Frequency of Communication with Friends and Family: Not on file   Frequency of Social Gatherings with Friends and Family: Not on file   Attends Religious  Services: Not on file   Active Member of Clubs or Organizations: Not on file   Attends Banker Meetings: Not on file   Marital Status: Not on file   SDOH:  SDOH Screenings   Alcohol Screen:    Last Alcohol Screening Score (AUDIT): Not on file  Depression (PHQ2-9):    PHQ-2 Score: Not on file  Financial Resource Strain:    Difficulty of Paying Living Expenses: Not on file  Food Insecurity:    Worried About Programme researcher, broadcasting/film/video in the Last Year: Not on file   [employer] of Food in the Last Year: Not on file  Housing:    Last Housing Risk Score: Not on file  Physical Activity:    Days of  Exercise per Week: Not on file   Minutes of Exercise per Session: Not on file  Social Connections:    Frequency of Communication with Friends and Family: Not on file   Frequency of Social Gatherings with Friends and Family: Not on file   Attends Religious Services: Not on file   Active Member of Clubs or Organizations: Not on file   Attends BankerClub or Organization Meetings: Not on file   Marital Status: Not on file  Stress:    Feeling of Stress : Not on file  Tobacco Use: Low Risk    Smoking Tobacco Use: Never Smoker   Smokeless Tobacco Use: Never Used  Transportation Needs:    Freight forwarderLack of Transportation (Medical): Not on file   Lack of Transportation (Non-Medical): Not on file    Has this patient used any form of tobacco in the last 30 days? (Cigarettes, Smokeless Tobacco, Cigars, and/or Pipes) Prescription not provided because: doesn't smoke  Current Medications:  Current Facility-Administered Medications  Medication Dose Route Frequency Provider Last Rate Last Admin   acetaminophen (TYLENOL) tablet 650 mg  650 mg Oral Q6H PRN Jackelyn PolingBerry, Jason A, NP       alum & mag hydroxide-simeth (MAALOX/MYLANTA) 200-200-20 MG/5ML suspension 30 mL  30 mL Oral Q4H PRN Nira ConnBerry, Jason A, NP       amLODipine (NORVASC) tablet 5 mg  5 mg Oral Daily Nira ConnBerry, Jason A, NP   5 mg at 01/11/20  [id number]   aspirin EC tablet 81 mg  81 mg Oral Daily Nira ConnBerry, Jason A, NP   81 mg at 01/11/20 [id number]   benztropine (COGENTIN) tablet 1 mg  1 mg Oral BID Nira ConnBerry, Jason A, NP   1 mg at 01/11/20 [id number]   haloperidol (HALDOL) tablet 20 mg  20 mg Oral QHS Nira ConnBerry, Jason A, NP       hydrOXYzine (ATARAX/VISTARIL) tablet 25 mg  25 mg Oral TID PRN Nira ConnBerry, Jason A, NP       insulin aspart (novoLOG) injection 0-6 Units  0-6 Units Subcutaneous TID WC Nira ConnBerry, Jason A, NP   6 Units at 01/11/20 0641   insulin aspart (novoLOG) injection 5 Units  5 Units Subcutaneous TID with meals Nira ConnBerry, Jason A, NP   5 Units at 01/11/20 0643   insulin detemir (LEVEMIR) injection 20 Units  20 Units Subcutaneous QHS Nira ConnBerry, Jason A, NP   20 Units at 01/10/20 2341   lithium carbonate (ESKALITH) CR tablet 450 mg  450 mg Oral Q12H Nira ConnBerry, Jason A, NP   450 mg at 01/11/20 [id number]   magnesium hydroxide (MILK OF MAGNESIA) suspension 30 mL  30 mL Oral Daily PRN Nira ConnBerry, Jason A, NP       metFORMIN (GLUCOPHAGE) tablet 1,000 mg  1,000 mg Oral BID WC Nira ConnBerry, Jason A, NP   1,000 mg at 01/11/20 0647   traZODone (DESYREL) tablet 50 mg  50 mg Oral QHS PRN Jackelyn PolingBerry, Jason A, NP       Current Outpatient Medications  Medication Sig Dispense Refill   amLODipine (NORVASC) 10 MG tablet Take 1 tablet (10 mg total) by mouth daily. 90 tablet 1   aspirin 81 MG EC tablet Take 1 tablet (81 mg total) by mouth daily. Swallow whole. 30 tablet 12   benztropine (COGENTIN) 1 MG tablet Take 1 tablet (1 mg total) by mouth 2 (two) times daily. 60 tablet 2   cloNIDine (CATAPRES) 0.2 MG tablet Take 0.2 mg by mouth 2 (two) times daily.     fluticasone (  FLONASE) 50 MCG/ACT nasal spray Place 1 spray into both nostrils daily as needed for allergies.      glipiZIDE (GLUCOTROL XL) 10 MG 24 hr tablet Take 1 tablet (10 mg total) by mouth daily with breakfast. 90 tablet 1   haloperidol (HALDOL) 20 MG tablet Take 1 tablet (20 mg total) by mouth at bedtime. 90 tablet 1   insulin  glargine (LANTUS) 100 UNIT/ML injection Inject 10 Units into the skin daily.     insulin lispro (HUMALOG) 100 UNIT/ML KwikPen Inject 2-10 Units into the skin 3 (three) times daily. Per sliding scale     lactulose (CHRONULAC) 10 GM/15ML solution Take 30 mLs by mouth daily as needed for mild constipation.      LEVEMIR FLEXTOUCH 100 UNIT/ML FlexPen Inject 20 Units into the skin at bedtime.     liraglutide (VICTOZA) 18 MG/3ML SOPN Inject 1.8 mg into the skin daily.     lithium carbonate (ESKALITH) 450 MG CR tablet Take 1 tablet (450 mg total) by mouth every 12 (twelve) hours. (Patient taking differently: Take 450 mg by mouth at bedtime. ) 60 tablet 2   metFORMIN (GLUCOPHAGE) 1000 MG tablet Take 1 tablet (1,000 mg total) by mouth 2 (two) times daily with a meal. 60 tablet 2   NOVOLOG FLEXPEN 100 UNIT/ML FlexPen Inject 5 Units into the skin with breakfast, with lunch, and with evening meal.     lisinopril (ZESTRIL) 40 MG tablet Take 1 tablet (40 mg total) by mouth daily. (Patient not taking: Reported on 01/11/2020) 90 tablet 1   lithium carbonate 300 MG capsule Take 300 mg by mouth every morning.      PTA Medications: (Not in a hospital admission)   Musculoskeletal  Strength & Muscle Tone: within normal limits Gait & Station: normal Patient leans: N/A  Psychiatric Specialty Exam  Presentation  General Appearance: Appropriate for Environment;Casual  Eye Contact:Good  Speech:Clear and Coherent;Normal Rate  Speech Volume:Normal  Handedness:Right   Mood and Affect  Mood:Euthymic  Affect:Appropriate;Congruent   Thought Process  Thought Processes:Coherent  Descriptions of Associations:Intact  Orientation:Full (Time, Place and Person)  Thought Content:WDL  Hallucinations:Hallucinations: None  Ideas of Reference:Paranoia (Some paranoia about people talking about him)  Suicidal Thoughts:Suicidal Thoughts: No  Homicidal Thoughts:Homicidal Thoughts: No   Sensorium   Memory:Immediate Good;Recent Good;Remote Good  Judgment:Fair  Insight:Fair   Executive Functions  Concentration:Good  Attention Span:Good  Recall:Fair  Fund of Knowledge:Fair  Language:Good   Psychomotor Activity  Psychomotor Activity:Psychomotor Activity: Normal   Assets  Assets:Communication Skills;Desire for Improvement;Financial Resources/Insurance;Housing;Social Support   Sleep  Sleep:Sleep: Fair   Physical Exam  Physical Exam Vitals and nursing note reviewed.  Constitutional:      Appearance: He is well-developed.  HENT:     Head: Normocephalic.  Eyes:     Pupils: Pupils are equal, round, and reactive to light.  Cardiovascular:     Rate and Rhythm: Normal rate.  Pulmonary:     Effort: Pulmonary effort is normal.  Musculoskeletal:        General: Normal range of motion.  Neurological:     Mental Status: He is alert and oriented to person, place, and time.    Review of Systems  Constitutional: Negative.   HENT: Negative.   Eyes: Negative.   Respiratory: Negative.   Cardiovascular: Negative.   Gastrointestinal: Negative.   Genitourinary: Negative.   Musculoskeletal: Negative.   Skin: Negative.   Neurological: Negative.   Endo/Heme/Allergies: Negative.   Psychiatric/Behavioral: Negative.    Blood  pressure (!) 164/103, pulse 93, temperature 97.7 F (36.5 C), temperature source Oral, resp. rate 18, height  (1.626 m), weight 210 lb (95.3 kg), SpO2 100 %. Body mass index is 36.05 kg/m.  Demographic Factors:  Male and Low socioeconomic status  Loss Factors: NA  Historical Factors: NA  Risk Reduction Factors:   Living with another person, especially a relative, Positive social support, Positive therapeutic relationship and Positive coping skills or problem solving skills  Continued Clinical Symptoms:  Previous Psychiatric Diagnoses and Treatments  Cognitive Features That Contribute To Risk:  None    Suicide Risk:  Mild:   Suicidal ideation of limited frequency, intensity, duration, and specificity.  There are no identifiable plans, no associated intent, mild dysphoria and related symptoms, good self-control (both objective and subjective assessment), few other risk factors, and identifiable protective factors, including available and accessible social support.  Plan Of Care/Follow-up recommendations:  Continue activity as tolerated. Continue diet as recommended by your PCP. Ensure to keep all appointments with outpatient providers.  Disposition: Discharge home  Maryfrances Bunnell, FNP 01/11/2020, 10:39 AM

## 2020-01-11 NOTE — ED Notes (Signed)
Pt continue with delusional thinking and unable to be reoriented. Pt states, "I'm here because they won't let me see my wife anymore because of my race. I need to have dreads and wear a wife beater first because they are racist. They found out that [name] is my mother. I'm that little baby that was missing and they were looking for a long time ago. And my mother was on in it. She put me in the water to float and I was saved by a lion. The lion trained me and raised me until I was able to care for myself. Now, I can't go back home because I'm rich and they band me from my home. They found out that I was [name] and I'm also the highest arch in the police department. I run it all and they don't like it. But they can't touch me because of my power". Pt continued with loose associations and delusional thinking. RN attempted reorienting him but unsuccessful. Denies SI/HI. Will continue to monitor for safety.

## 2020-01-11 NOTE — ED Notes (Signed)
Pt sleeping in no acute distress. Safety maintained. 

## 2020-01-11 NOTE — ED Notes (Signed)
Muffin and ginger ale

## 2020-01-11 NOTE — ED Triage Notes (Signed)
Presents with delusional behavior, pt thinks he is [employer].

## 2020-01-11 NOTE — ED Notes (Signed)
Pt presents with delusional behavior, pt thinks he is Mitchell [name] and [name] is his mother.  Denies SI, HI or AVH. Pt diagnosed with Schizophrenia.  Skin search completed.  Monitoring for safety.

## 2020-01-11 NOTE — Discharge Instructions (Addendum)
Patient is instructed prior to discharge to: Take all medications as prescribed by his/her mental healthcare provider. Report any adverse effects and or reactions from the medicines to his/her outpatient provider promptly. Patient has been instructed & cautioned: To not engage in alcohol and or illegal drug use while on prescription medicines. In the event of worsening symptoms, patient is instructed to call the crisis hotline, 911 and or go to the nearest ED for appropriate evaluation and treatment of symptoms. To follow-up with his/her primary care provider for your other medical issues, concerns and or health care needs.   Follow-Up with you primary care provider regarding elevated liver enzymes

## 2020-05-20 ENCOUNTER — Other Ambulatory Visit: Payer: Self-pay | Admitting: Internal Medicine

## 2020-05-21 LAB — LIPID PANEL
Cholesterol: 134 mg/dL (ref <200)
HDL: 36 mg/dL — ABNORMAL LOW (ref >=40)
LDL Cholesterol (Calc): 67 mg/dL (calc)
Non-HDL Cholesterol (Calc): 98 mg/dL (calc) (ref <130)
Total CHOL/HDL Ratio: 3.7 (calc) (ref <5.0)
Triglycerides: 294 mg/dL — ABNORMAL HIGH (ref <150)

## 2020-05-21 LAB — VITAMIN D 25 HYDROXY (VIT D DEFICIENCY, FRACTURES): Vit D, 25-Hydroxy: 19 ng/mL — ABNORMAL LOW (ref 30–100)

## 2020-05-21 LAB — CBC
HCT: 43.7 % (ref 38.5–50.0)
Hemoglobin: 14.5 g/dL (ref 13.2–17.1)
MCH: 30.9 pg (ref 27.0–33.0)
MCHC: 33.2 g/dL (ref 32.0–36.0)
MCV: 93.2 fL (ref 80.0–100.0)
MPV: 10.2 fL (ref 7.5–12.5)
Platelets: 245 10*3/uL (ref 140–400)
RBC: 4.69 10*6/uL (ref 4.20–5.80)
RDW: 14.1 % (ref 11.0–15.0)
WBC: 6.7 10*3/uL (ref 3.8–10.8)

## 2020-05-21 LAB — COMPLETE METABOLIC PANEL WITH GFR
AG Ratio: 1.5 (calc) (ref 1.0–2.5)
ALT: 48 U/L — ABNORMAL HIGH (ref 9–46)
AST: 30 U/L (ref 10–35)
Albumin: 4.5 g/dL (ref 3.6–5.1)
Alkaline phosphatase (APISO): 187 U/L — ABNORMAL HIGH (ref 35–144)
BUN: 13 mg/dL (ref 7–25)
CO2: 23 mmol/L (ref 20–32)
Calcium: 9.7 mg/dL (ref 8.6–10.3)
Chloride: 94 mmol/L — ABNORMAL LOW (ref 98–110)
Creat: 1.32 mg/dL (ref 0.70–1.33)
GFR, Est African American: 68 mL/min/{1.73_m2} (ref >=60)
GFR, Est Non African American: 59 mL/min/{1.73_m2} — ABNORMAL LOW (ref >=60)
Globulin: 3.1 g/dL (calc) (ref 1.9–3.7)
Glucose, Bld: 365 mg/dL — ABNORMAL HIGH (ref 65–99)
Potassium: 4.1 mmol/L (ref 3.5–5.3)
Sodium: 130 mmol/L — ABNORMAL LOW (ref 135–146)
Total Bilirubin: 0.4 mg/dL (ref 0.2–1.2)
Total Protein: 7.6 g/dL (ref 6.1–8.1)

## 2020-05-21 LAB — PSA: PSA: 1.02 ng/mL (ref <=4.0)

## 2020-05-21 LAB — TSH: TSH: 3.14 mIU/L (ref 0.40–4.50)

## 2020-07-24 ENCOUNTER — Other Ambulatory Visit: Payer: Self-pay

## 2020-07-24 ENCOUNTER — Encounter (HOSPITAL_COMMUNITY): Payer: Self-pay | Admitting: Emergency Medicine

## 2020-07-24 ENCOUNTER — Emergency Department (HOSPITAL_COMMUNITY): Payer: Medicare Other

## 2020-07-24 ENCOUNTER — Emergency Department (HOSPITAL_COMMUNITY)
Admission: EM | Admit: 2020-07-24 | Discharge: 2020-07-24 | Disposition: A | Discharge status: Home / Self Care | Payer: Medicare Other | Attending: Emergency Medicine | Admitting: Emergency Medicine

## 2020-07-24 DIAGNOSIS — E119 Type 2 diabetes mellitus without complications: Secondary | ICD-10-CM | POA: Insufficient documentation

## 2020-07-24 DIAGNOSIS — I10 Essential (primary) hypertension: Secondary | ICD-10-CM | POA: Insufficient documentation

## 2020-07-24 DIAGNOSIS — R072 Precordial pain: Secondary | ICD-10-CM | POA: Diagnosis not present

## 2020-07-24 DIAGNOSIS — Z79899 Other long term (current) drug therapy: Secondary | ICD-10-CM | POA: Diagnosis not present

## 2020-07-24 DIAGNOSIS — Z794 Long term (current) use of insulin: Secondary | ICD-10-CM | POA: Insufficient documentation

## 2020-07-24 DIAGNOSIS — Z7984 Long term (current) use of oral hypoglycemic drugs: Secondary | ICD-10-CM | POA: Insufficient documentation

## 2020-07-24 DIAGNOSIS — Z7982 Long term (current) use of aspirin: Secondary | ICD-10-CM | POA: Insufficient documentation

## 2020-07-24 DIAGNOSIS — R0789 Other chest pain: Secondary | ICD-10-CM | POA: Diagnosis present

## 2020-07-24 LAB — CBC
HCT: 44.5 % (ref 39.0–52.0)
Hemoglobin: 15.3 g/dL (ref 13.0–17.0)
MCH: 30.6 pg (ref 26.0–34.0)
MCHC: 34.4 g/dL (ref 30.0–36.0)
MCV: 89 fL (ref 80.0–100.0)
Platelets: 245 10*3/uL (ref 150–400)
RBC: 5 MIL/uL (ref 4.22–5.81)
RDW: 13.2 % (ref 11.5–15.5)
WBC: 7.6 10*3/uL (ref 4.0–10.5)
nRBC: 0 % (ref 0.0–0.2)

## 2020-07-24 LAB — BASIC METABOLIC PANEL
Anion gap: 8 (ref 5–15)
BUN: 12 mg/dL (ref 6–20)
CO2: 26 mmol/L (ref 22–32)
Calcium: 9.9 mg/dL (ref 8.9–10.3)
Chloride: 98 mmol/L (ref 98–111)
Creatinine, Ser: 1.17 mg/dL (ref 0.61–1.24)
GFR, Estimated: >60 mL/min (ref >60)
Glucose, Bld: 129 mg/dL — ABNORMAL HIGH (ref 70–99)
Potassium: 3.8 mmol/L (ref 3.5–5.1)
Sodium: 132 mmol/L — ABNORMAL LOW (ref 135–145)

## 2020-07-24 LAB — TROPONIN I (HIGH SENSITIVITY)
Troponin I (High Sensitivity): 3 ng/L (ref <18)
Troponin I (High Sensitivity): 3 ng/L

## 2020-07-24 NOTE — ED Provider Notes (Signed)
[facility] EMERGENCY DEPARTMENT Provider Note   CSN: [id number] Arrival date & time: 07/24/20  1029     History Chief Complaint  Patient presents with  . Chest Pain    Mitchell [name] is a 60 y.o. male.  HPI Patient ports he has history of heart attack.  He had chest pain this morning at 8 AM.  He reports it was a pressure and aching quality.  He also felt some pain in his arm.  He reports he had a similar pain to that a few days ago as well but could not get them by to call EMS for him at that time.  It finally resolved on its own.  Patient was transferred by EMS and given aspirin and sublingual nitroglycerin.  He reports that made all of his pain go away.  He is now pain-free.  Patient reports now he is hungry and would like something to eat.    Past Medical History:  Diagnosis Date  . Bipolar affective disorder (HCC)    takes Zyprexa daily  . Diabetes mellitus    takes Victoza,Metformin,and Glipizide daily  . Hypertension    takes Amlodipine,Lisinopril and Clonidine daily  . Hyponatremia    history of  . Mental disorder    takes Lithium daily  . Schizoaffective disorder    takes Trazodone nightly  . Seasonal allergies    takes Claritin daily  . Sleep apnea    sleep study >52yrs ago  . Stroke [facility])    left arm weakness    Patient Active Problem List   Diagnosis Date Noted  . Schizoaffective disorder (HCC) 01/04/2019  . Schizophrenia (HCC) 07/18/2018  . 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 Coarse tremors 10/02/2014  . Hypertension   . Type 2 diabetes mellitus (HCC)   . Sleep apnea   . Hyponatremia 03/13/2011  . Schizoaffective disorder, bipolar type (HCC) 02/28/2011    Past Surgical History:  Procedure Laterality Date  . CATARACT EXTRACTION W/PHACO Right 02/14/2013   Procedure: CATARACT EXTRACTION PHACO AND INTRAOCULAR LENS PLACEMENT (IOC);  Surgeon: Shade Flood, MD;  Location: [facility] OR;  Service: Ophthalmology;  Laterality: Right;  . CATARACT  EXTRACTION W/PHACO Left 06/13/2013   Procedure: CATARACT EXTRACTION PHACO AND INTRAOCULAR LENS PLACEMENT (IOC);  Surgeon: Shade Flood, MD;  Location: [facility] OR;  Service: Ophthalmology;  Laterality: Left;  . CIRCUMCISION  20 yrs. ago  . EYE SURGERY         History reviewed. No pertinent family history.  Social History   Tobacco Use  . Smoking status: Never Smoker  . Smokeless tobacco: Never Used  Vaping Use  . Vaping Use: Never used  Substance Use Topics  . Alcohol use: Yes    Comment: "a whole lot of wine every day"  . Drug use: No    Home Medications Prior to Admission medications   Medication Sig Start Date End Date Taking? Authorizing Provider  amLODipine (NORVASC) 10 MG tablet Take 1 tablet (10 mg total) by mouth daily. 01/11/19   Malvin Johns, MD  aspirin 81 MG EC tablet Take 1 tablet (81 mg total) by mouth daily. Swallow whole. 01/11/19   Malvin Johns, MD  benztropine (COGENTIN) 1 MG tablet Take 1 tablet (1 mg total) by mouth 2 (two) times daily. 01/10/19   Malvin Johns, MD  cloNIDine (CATAPRES) 0.2 MG tablet Take 0.2 mg by mouth 2 (two) times daily.    [provider]  fluticasone (FLONASE) 50 MCG/ACT nasal spray Place 1 spray  into both nostrils daily as needed for allergies.     [provider]  glipiZIDE (GLUCOTROL XL) 10 MG 24 hr tablet Take 1 tablet (10 mg total) by mouth daily with breakfast. 01/11/19   Malvin Johns, MD  haloperidol (HALDOL) 20 MG tablet Take 1 tablet (20 mg total) by mouth at bedtime. 01/10/19   Malvin Johns, MD  insulin glargine (LANTUS) 100 UNIT/ML injection Inject 10 Units into the skin daily.    [provider]  insulin lispro (HUMALOG) 100 UNIT/ML KwikPen Inject 2-10 Units into the skin 3 (three) times daily. Per sliding scale 09/25/18   [provider]  lactulose (CHRONULAC) 10 GM/15ML solution Take 30 mLs by mouth daily as needed for mild constipation.  12/28/19   [provider]  LEVEMIR FLEXTOUCH 100  UNIT/ML FlexPen Inject 20 Units into the skin at bedtime. 12/28/19   [provider]  liraglutide (VICTOZA) 18 MG/3ML SOPN Inject 1.8 mg into the skin daily.    [provider]  lisinopril (ZESTRIL) 40 MG tablet Take 1 tablet (40 mg total) by mouth daily. Patient not taking: Reported on 01/11/2020 01/11/19   Malvin Johns, MD  lithium carbonate (ESKALITH) 450 MG CR tablet Take 1 tablet (450 mg total) by mouth every 12 (twelve) hours. Patient taking differently: Take 450 mg by mouth at bedtime.  01/10/19   Malvin Johns, MD  metFORMIN (GLUCOPHAGE) 1000 MG tablet Take 1 tablet (1,000 mg total) by mouth 2 (two) times daily with a meal. 01/10/19   Malvin Johns, MD  NOVOLOG FLEXPEN 100 UNIT/ML FlexPen Inject 5 Units into the skin with breakfast, with lunch, and with evening meal. 12/28/19   [provider]    Allergies    Patient has no known allergies.  Review of Systems   Review of Systems 10 systems reviewed and negative except as per HPI Physical Exam Updated Vital Signs BP (!) 147/94   Pulse 98   Temp 98.8 F (37.1 C) (Oral)   Resp 19   SpO2 99%   Physical Exam Constitutional:      Comments: Alert and nontoxic.  No respiratory distress.  Clear mental status.  HENT:     Mouth/Throat:     Pharynx: Oropharynx is clear.  Eyes:     Extraocular Movements: Extraocular movements intact.  Cardiovascular:     Rate and Rhythm: Normal rate and regular rhythm.  Pulmonary:     Effort: Pulmonary effort is normal.     Breath sounds: Normal breath sounds.  Abdominal:     General: There is no distension.     Palpations: Abdomen is soft.     Tenderness: There is no abdominal tenderness. There is no guarding.  Musculoskeletal:        General: No swelling or tenderness. Normal range of motion.  Skin:    General: Skin is warm and dry.  Neurological:     General: No focal deficit present.     Mental Status: He is oriented to person, place, and time.     Coordination:  Coordination normal.  Psychiatric:        Mood and Affect: Mood normal.     ED Results / Procedures / Treatments   Labs (all labs ordered are listed, but only abnormal results are displayed) Labs Reviewed  BASIC METABOLIC PANEL - Abnormal; Notable for the following components:      Result Value   Sodium 132 (*)    Glucose, Bld 129 (*)    All other  components within normal limits  CBC  TROPONIN I (HIGH SENSITIVITY)  TROPONIN I (HIGH SENSITIVITY)    EKG EKG Interpretation  Date/Time:  Thursday July 24 2020 10:31:03 EDT Ventricular Rate:  104 PR Interval:  152 QRS Duration: 84 QT Interval:  324 QTC Calculation: 426 R Axis:   32 Text Interpretation: Sinus tachycardia Nonspecific T wave abnormality Abnormal ECG no sig change from previous Confirmed by Arby Barrette [phone]) on 07/24/2020 2:02:32 PM   Radiology DG Chest 2 View  Result Date: 07/24/2020 CLINICAL DATA:  Chest pain. EXAM: CHEST - 2 VIEW COMPARISON:  June 26, 2017. FINDINGS: The heart size and mediastinal contours are within normal limits. Both lungs are clear. No visible pleural effusions or pneumothorax. No acute osseous abnormality. IMPRESSION: No active cardiopulmonary disease. Electronically Signed   By: Feliberto Harts MD   On: 07/24/2020 11:17    Procedures Procedures   Medications Ordered in ED Medications - No data to display  ED Course  I have reviewed the triage vital signs and the nursing notes.  Pertinent labs & imaging results that were available during my care of the patient were reviewed by me and considered in my medical decision making (see chart for details).    MDM Rules/Calculators/A&P                          Patient reports chest pain earlier today.  He did get relief after getting aspirin and nitroglycerin from medics.  Patient is now pain-free.  EKG from medics reviewed normal without any acute ischemic appearance.  EKG in the emergency department no change from previous.  First  troponin is negative.  Clinically patient is well in appearance.  Vital signs are stable.  Will obtain second troponin.  If troponin remains negative anticipate close follow-up with cardiology on outpatient basis. Final Clinical Impression(s) / ED Diagnoses Final diagnoses:  Precordial pain    Rx / DC Orders ED Discharge Orders    None       Arby Barrette, MD 07/24/20 1527

## 2020-07-24 NOTE — ED Triage Notes (Addendum)
Pt arrives via EMS for CP that has been going on for several day. Non radiating chest pain. Pt states he vomited some today and yesterday. Pt came from an adult day care center, but states he lives at home alone.  EMS gave 0.4 nitro and 324 ASA. BP 164/78, 98% on room air, CBG 297. Nitro helped his pain from 9/10 to 8/10. 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

## 2020-07-24 NOTE — ED Provider Notes (Signed)
MSE was initiated and I personally evaluated the patient and placed orders (if any) at  11:14 AM on July 24, 2020.  Patient in the ED with complaints of chest pain, described as 8 out of 10.  He states that he was given 1 sublingual nitro glycerin by EMS and noted mild improvement from 9 out of 10 chest pain.  It has been constant since this morning.  He states that chest pain comes and goes.  He feels mild associated shortness of breath, but denies N/V/diaphoresis.  He reports history of MI.  Does not appear to be in any acute distress on my exam.  The patient appears stable so that the remainder of the MSE may be completed by another provider.   Lorelee New, PA-C 07/24/20 1117    Gerhard Munch, MD 08/01/20 [phone]

## 2020-07-24 NOTE — ED Notes (Signed)
Pt denies chest pain at this time.

## 2020-07-24 NOTE — Discharge Instructions (Addendum)
1.  Continue all of your regularly prescribed medications. 2.  Schedule follow-up with your family doctor and your cardiologist within the next week. 3.  Return to the emergency department if you have recurrence of symptoms, new, worsening or concerning symptoms.

## 2020-07-24 NOTE — ED Notes (Signed)
DC instructions reviewed with pt. PT verbalized understanding.  PT Dc.  

## 2020-10-17 ENCOUNTER — Emergency Department (HOSPITAL_COMMUNITY)
Admission: EM | Admit: 2020-10-17 | Discharge: 2020-10-18 | Disposition: A | Discharge status: Home / Self Care | Payer: Medicare Other | Attending: Emergency Medicine | Admitting: Emergency Medicine

## 2020-10-17 ENCOUNTER — Emergency Department (HOSPITAL_COMMUNITY): Payer: Medicare Other

## 2020-10-17 ENCOUNTER — Encounter (HOSPITAL_COMMUNITY): Payer: Self-pay | Admitting: Emergency Medicine

## 2020-10-17 DIAGNOSIS — Z79899 Other long term (current) drug therapy: Secondary | ICD-10-CM | POA: Diagnosis not present

## 2020-10-17 DIAGNOSIS — R109 Unspecified abdominal pain: Secondary | ICD-10-CM | POA: Diagnosis present

## 2020-10-17 DIAGNOSIS — I10 Essential (primary) hypertension: Secondary | ICD-10-CM | POA: Diagnosis not present

## 2020-10-17 DIAGNOSIS — Z7984 Long term (current) use of oral hypoglycemic drugs: Secondary | ICD-10-CM | POA: Insufficient documentation

## 2020-10-17 DIAGNOSIS — Z794 Long term (current) use of insulin: Secondary | ICD-10-CM | POA: Diagnosis not present

## 2020-10-17 DIAGNOSIS — R112 Nausea with vomiting, unspecified: Secondary | ICD-10-CM | POA: Diagnosis not present

## 2020-10-17 DIAGNOSIS — R519 Headache, unspecified: Secondary | ICD-10-CM | POA: Diagnosis not present

## 2020-10-17 DIAGNOSIS — E119 Type 2 diabetes mellitus without complications: Secondary | ICD-10-CM | POA: Diagnosis not present

## 2020-10-17 DIAGNOSIS — Z7982 Long term (current) use of aspirin: Secondary | ICD-10-CM | POA: Insufficient documentation

## 2020-10-17 LAB — CBC WITH DIFFERENTIAL/PLATELET
Abs Immature Granulocytes: 0.04 10*3/uL (ref 0.00–0.07)
Basophils Absolute: 0 10*3/uL (ref 0.0–0.1)
Basophils Relative: 1 %
Eosinophils Absolute: 0.2 10*3/uL (ref 0.0–0.5)
Eosinophils Relative: 3 %
HCT: 40.6 % (ref 39.0–52.0)
Hemoglobin: 14.2 g/dL (ref 13.0–17.0)
Immature Granulocytes: 1 %
Lymphocytes Relative: 17 %
Lymphs Abs: 1.1 10*3/uL (ref 0.7–4.0)
MCH: 30.9 pg (ref 26.0–34.0)
MCHC: 35 g/dL (ref 30.0–36.0)
MCV: 88.3 fL (ref 80.0–100.0)
Monocytes Absolute: 0.3 10*3/uL (ref 0.1–1.0)
Monocytes Relative: 5 %
Neutro Abs: 4.7 10*3/uL (ref 1.7–7.7)
Neutrophils Relative %: 73 %
Platelets: 248 10*3/uL (ref 150–400)
RBC: 4.6 MIL/uL (ref 4.22–5.81)
RDW: 12.8 % (ref 11.5–15.5)
WBC: 6.4 10*3/uL (ref 4.0–10.5)
nRBC: 0 % (ref 0.0–0.2)

## 2020-10-17 LAB — COMPREHENSIVE METABOLIC PANEL
ALT: 30 U/L (ref 0–44)
AST: 25 U/L (ref 15–41)
Albumin: 4.1 g/dL (ref 3.5–5.0)
Alkaline Phosphatase: 107 U/L (ref 38–126)
Anion gap: 10 (ref 5–15)
BUN: 12 mg/dL (ref 6–20)
CO2: 22 mmol/L (ref 22–32)
Calcium: 9.2 mg/dL (ref 8.9–10.3)
Chloride: 98 mmol/L (ref 98–111)
Creatinine, Ser: 1.18 mg/dL (ref 0.61–1.24)
GFR, Estimated: >60 mL/min (ref >60)
Glucose, Bld: 193 mg/dL — ABNORMAL HIGH (ref 70–99)
Potassium: 3.5 mmol/L (ref 3.5–5.1)
Sodium: 130 mmol/L — ABNORMAL LOW (ref 135–145)
Total Bilirubin: 0.5 mg/dL (ref 0.3–1.2)
Total Protein: 7.8 g/dL (ref 6.5–8.1)

## 2020-10-17 LAB — URINALYSIS, ROUTINE W REFLEX MICROSCOPIC
Bacteria, UA: NONE SEEN
Bilirubin Urine: NEGATIVE
Glucose, UA: 50 mg/dL — AB
Ketones, ur: 5 mg/dL — AB
Leukocytes,Ua: NEGATIVE
Nitrite: NEGATIVE
Protein, ur: NEGATIVE mg/dL
Specific Gravity, Urine: 1.004 — ABNORMAL LOW (ref 1.005–1.030)
pH: 6 (ref 5.0–8.0)

## 2020-10-17 LAB — LIPASE, BLOOD: Lipase: 55 U/L — ABNORMAL HIGH (ref 11–51)

## 2020-10-17 LAB — CBG MONITORING, ED: Glucose-Capillary: 182 mg/dL — ABNORMAL HIGH (ref 70–99)

## 2020-10-17 MED ORDER — ACETAMINOPHEN 500 MG PO TABS
1000.0000 mg | ORAL_TABLET | Freq: Once | ORAL | Status: AC
  Administered 2020-10-17: 1000 mg via ORAL
  Filled 2020-10-17: qty 2

## 2020-10-17 NOTE — Discharge Instructions (Signed)
It was our pleasure to provide your ER care today - we hope that you feel better.  Drink plenty of fluids/stay well hydrated.   If constipation, get adequate fiber in diet, stay well hydrated, take colace 2x/day and miralax once a day as need.   Your blood pressure is high tonight - continue your blood pressure medication, limit salt intake, and follow up with primary care doctor in one week.   Return to ER if worse, new symptoms, fevers, new, worsening or severe pain, persistent vomiting, chest pain, trouble breathing, severe headache, or other concern.

## 2020-10-17 NOTE — ED Provider Notes (Signed)
Emergency Medicine Provider Triage Evaluation Note  Mitchell [name] , a 60 y.o. male  was evaluated in triage.  Pt complains of abdominal pain x2 weeks.  He also has an associated headache and nausea has been having greater than 5 episodes of emesis daily.  Passed gas earlier this morning and this morning, endorses small amount of bright red blood on toilet paper.  Frank blood with dark tarry stool.  No prior abdominal surgeries.  Review of Systems  Positive: Headache, nausea, vomiting, abdominal pain Negative: Dysuria, hematuria  Physical Exam  BP (!) 171/94   Pulse (!) 118   Temp 98.9 F (37.2 C) (Oral)   Resp 18   Ht 5\' 4"  (1.626 m)   Wt 95.3 kg   SpO2 95%   BMI 36.05 kg/m  Gen:   Awake, no distress   Resp:  Normal effort  MSK:   Moves extremities without difficulty  Other:  Abdomen distended, periumbilical tenderness to palpation  Medical Decision Making  Medically screening exam initiated at 3:13 PM.  Appropriate orders placed.  was informed that the remainder of the evaluation will be completed by another provider, this initial triage assessment does not replace that evaluation, and the importance of remaining in the ED until their evaluation is complete.     [name], PA-C 10/17/20 1516    10/19/20, MD 10/27/20 1351

## 2020-10-17 NOTE — ED Provider Notes (Signed)
[facility] EMERGENCY DEPARTMENT Provider Note   CSN: [id number] Arrival date & time: 10/17/20  1445     History Chief Complaint  Patient presents with   Abdominal Pain   Headache    Mitchell [name] is a 60 y.o. male.  Patient c/o abdominal pain in the past couple weeks. Symptoms acute onset, moderate, persistent, recurrent, without specific exacerbating or alleviating factors. States a few episodes of vomiting per day. Had normal bm yesterday. States stool was dark then. Denies abd distension. No fever or chills. Denies hx pud, gallstones, or pancreatitis. Denies dysuria or hematuria. No flank or back pain. Denies chest pain or sob. No cough or uri symptoms. Mild frontal headache earlier, currently resolved.   The history is provided by the patient.  Abdominal Pain Associated symptoms: nausea and vomiting   Associated symptoms: no chest pain, no cough, no dysuria, no fever, no shortness of breath and no sore throat   Headache Associated symptoms: abdominal pain, nausea and vomiting   Associated symptoms: no back pain, no cough, no fever, no neck pain and no sore throat       Past Medical History:  Diagnosis Date   Bipolar affective disorder (HCC)    takes Zyprexa daily   Diabetes mellitus    takes Victoza,Metformin,and Glipizide daily   Hypertension    takes Amlodipine,Lisinopril and Clonidine daily   Hyponatremia    history of   Mental disorder    takes Lithium daily   Schizoaffective disorder    takes Trazodone nightly   Seasonal allergies    takes Claritin daily   Sleep apnea    sleep study >72yrs ago   Stroke [facility])    left arm weakness    Patient Active Problem List   Diagnosis Date Noted   Schizoaffective disorder (HCC) 01/04/2019   Schizophrenia (HCC) 07/18/2018   Lithium toxicity 10/02/2014   Coarse tremors 10/02/2014   Hypertension    Type 2 diabetes mellitus (HCC)    Sleep apnea    Hyponatremia 03/13/2011   Schizoaffective  disorder, bipolar type (HCC) 02/28/2011    Past Surgical History:  Procedure Laterality Date   CATARACT EXTRACTION W/PHACO Right 02/14/2013   Procedure: CATARACT EXTRACTION PHACO AND INTRAOCULAR LENS PLACEMENT (IOC);  Surgeon: Shade Flood, MD;  Location: [facility] OR;  Service: Ophthalmology;  Laterality: Right;   CATARACT EXTRACTION W/PHACO Left 06/13/2013   Procedure: CATARACT EXTRACTION PHACO AND INTRAOCULAR LENS PLACEMENT (IOC);  Surgeon: Shade Flood, MD;  Location: [facility] OR;  Service: Ophthalmology;  Laterality: Left;   CIRCUMCISION  20 yrs. ago   EYE SURGERY         No family history on file.  Social History   Tobacco Use   Smoking status: Never   Smokeless tobacco: Never  Vaping Use   Vaping Use: Never used  Substance Use Topics   Alcohol use: Yes    Comment: "a whole lot of wine every day"   Drug use: No    Home Medications Prior to Admission medications   Medication Sig Start Date End Date Taking? Authorizing Provider  amLODipine (NORVASC) 10 MG tablet Take 1 tablet (10 mg total) by mouth daily. 01/11/19   Malvin Johns, MD  aspirin 81 MG EC tablet Take 1 tablet (81 mg total) by mouth daily. Swallow whole. 01/11/19   Malvin Johns, MD  benztropine (COGENTIN) 1 MG tablet Take 1 tablet (1 mg total) by mouth 2 (two) times daily. 01/10/19   Malvin Johns, MD  cloNIDine (  CATAPRES) 0.2 MG tablet Take 0.2 mg by mouth 2 (two) times daily.    [provider]  fluticasone (FLONASE) 50 MCG/ACT nasal spray Place 1 spray into both nostrils daily as needed for allergies.     [provider]  glipiZIDE (GLUCOTROL XL) 10 MG 24 hr tablet Take 1 tablet (10 mg total) by mouth daily with breakfast. 01/11/19   Malvin Johns, MD  haloperidol (HALDOL) 20 MG tablet Take 1 tablet (20 mg total) by mouth at bedtime. 01/10/19   Malvin Johns, MD  insulin glargine (LANTUS) 100 UNIT/ML injection Inject 10 Units into the skin daily.    [provider]  insulin lispro (HUMALOG) 100  UNIT/ML KwikPen Inject 2-10 Units into the skin 3 (three) times daily. Per sliding scale 09/25/18   [provider]  lactulose (CHRONULAC) 10 GM/15ML solution Take 30 mLs by mouth daily as needed for mild constipation.  12/28/19   [provider]  LEVEMIR FLEXTOUCH 100 UNIT/ML FlexPen Inject 20 Units into the skin at bedtime. 12/28/19   [provider]  liraglutide (VICTOZA) 18 MG/3ML SOPN Inject 1.8 mg into the skin daily.    [provider]  lisinopril (ZESTRIL) 40 MG tablet Take 1 tablet (40 mg total) by mouth daily. Patient not taking: Reported on 01/11/2020 01/11/19   Malvin Johns, MD  lithium carbonate (ESKALITH) 450 MG CR tablet Take 1 tablet (450 mg total) by mouth every 12 (twelve) hours. Patient taking differently: Take 450 mg by mouth at bedtime.  01/10/19   Malvin Johns, MD  metFORMIN (GLUCOPHAGE) 1000 MG tablet Take 1 tablet (1,000 mg total) by mouth 2 (two) times daily with a meal. 01/10/19   Malvin Johns, MD  NOVOLOG FLEXPEN 100 UNIT/ML FlexPen Inject 5 Units into the skin with breakfast, with lunch, and with evening meal. 12/28/19   [provider]    Allergies    Patient has no known allergies.  Review of Systems   Review of Systems  Constitutional:  Negative for fever.  HENT:  Negative for sore throat.   Eyes:  Negative for redness.  Respiratory:  Negative for cough and shortness of breath.   Cardiovascular:  Negative for chest pain.  Gastrointestinal:  Positive for abdominal pain, nausea and vomiting.  Genitourinary:  Negative for dysuria and flank pain.  Musculoskeletal:  Negative for back pain and neck pain.  Skin:  Negative for rash.  Neurological:  Positive for headaches.  Hematological:  Does not bruise/bleed easily.  Psychiatric/Behavioral:  Negative for confusion.    Physical Exam Updated Vital Signs BP (!) 159/101 (BP Location: Right Arm)   Pulse (!) 108   Temp 98.6 F (37 C) (Oral)   Resp (!) 1   Ht 1.626 m (5'  4")   Wt 95.3 kg   SpO2 97%   BMI 36.05 kg/m   Physical Exam Vitals and nursing note reviewed.  Constitutional:      Appearance: Normal appearance. He is well-developed.  HENT:     Head: Atraumatic.     Comments: No sinus or temporal tenderness.     Nose: Nose normal.     Mouth/Throat:     Mouth: Mucous membranes are moist.     Pharynx: Oropharynx is clear.  Eyes:     General: No scleral icterus.    Conjunctiva/sclera: Conjunctivae normal.     Pupils: Pupils are equal, round, and reactive to light.  Neck:     Trachea: No tracheal deviation.     Comments:  No stiffness or rigidity.  Cardiovascular:     Rate and Rhythm: Normal rate and regular rhythm.     Pulses: Normal pulses.     Heart sounds: Normal heart sounds. No murmur heard.   No friction rub. No gallop.  Pulmonary:     Effort: Pulmonary effort is normal. No accessory muscle usage or respiratory distress.     Breath sounds: Normal breath sounds.  Abdominal:     General: Bowel sounds are normal. There is no distension.     Palpations: Abdomen is soft. There is no mass.     Tenderness: There is abdominal tenderness. There is no guarding or rebound.     Hernia: No hernia is present.     Comments: Mid abd tenderness.   Genitourinary:    Comments: No cva tenderness. Medium brown stool on rectal exam - hemoccult negative. No mass felt.  Musculoskeletal:        General: No swelling or tenderness.     Cervical back: Normal range of motion and neck supple. No rigidity.     Right lower leg: No edema.     Left lower leg: No edema.  Skin:    General: Skin is warm and dry.     Findings: No rash.  Neurological:     Mental Status: He is alert.     Comments: Alert, speech clear. Motor/sens grossly intact bil. Steady gait.   Psychiatric:        Mood and Affect: Mood normal.    ED Results / Procedures / Treatments   Labs (all labs ordered are listed, but only abnormal results are displayed) Results for orders placed or  performed during the hospital encounter of 10/17/20  CBC with Differential  Result Value Ref Range   WBC 6.4 4.0 - 10.5 K/uL   RBC 4.60 4.22 - 5.81 MIL/uL   Hemoglobin 14.2 13.0 - 17.0 g/dL   HCT 76.8 11.5 - 72.6 %   MCV 88.3 80.0 - 100.0 fL   MCH 30.9 26.0 - 34.0 pg   MCHC 35.0 30.0 - 36.0 g/dL   RDW 20.3 55.9 - 74.1 %   Platelets 248 150 - 400 K/uL   nRBC 0.0 0.0 - 0.2 %   Neutrophils Relative % 73 %   Neutro Abs 4.7 1.7 - 7.7 K/uL   Lymphocytes Relative 17 %   Lymphs Abs 1.1 0.7 - 4.0 K/uL   Monocytes Relative 5 %   Monocytes Absolute 0.3 0.1 - 1.0 K/uL   Eosinophils Relative 3 %   Eosinophils Absolute 0.2 0.0 - 0.5 K/uL   Basophils Relative 1 %   Basophils Absolute 0.0 0.0 - 0.1 K/uL   Immature Granulocytes 1 %   Abs Immature Granulocytes 0.04 0.00 - 0.07 K/uL  Comprehensive metabolic panel  Result Value Ref Range   Sodium 130 (L) 135 - 145 mmol/L   Potassium 3.5 3.5 - 5.1 mmol/L   Chloride 98 98 - 111 mmol/L   CO2 22 22 - 32 mmol/L   Glucose, Bld 193 (H) 70 - 99 mg/dL   BUN 12 6 - 20 mg/dL   Creatinine, Ser 6.38 0.61 - 1.24 mg/dL   Calcium 9.2 8.9 - 45.3 mg/dL   Total Protein 7.8 6.5 - 8.1 g/dL   Albumin 4.1 3.5 - 5.0 g/dL   AST 25 15 - 41 U/L   ALT 30 0 - 44 U/L   Alkaline Phosphatase 107 38 - 126 U/L   Total Bilirubin 0.5 0.3 -  1.2 mg/dL   GFR, Estimated >16>60 >10>60 mL/min   Anion gap 10 5 - 15  Lipase, blood  Result Value Ref Range   Lipase 55 (H) 11 - 51 U/L  Urinalysis, Routine w reflex microscopic Urine, Clean Catch  Result Value Ref Range   Color, Urine STRAW (A) YELLOW   APPearance CLEAR CLEAR   Specific Gravity, Urine 1.004 (L) 1.005 - 1.030   pH 6.0 5.0 - 8.0   Glucose, UA 50 (A) NEGATIVE mg/dL   Hgb urine dipstick SMALL (A) NEGATIVE   Bilirubin Urine NEGATIVE NEGATIVE   Ketones, ur 5 (A) NEGATIVE mg/dL   Protein, ur NEGATIVE NEGATIVE mg/dL   Nitrite NEGATIVE NEGATIVE   Leukocytes,Ua NEGATIVE NEGATIVE   WBC, UA 0-5 0 - 5 WBC/hpf   Bacteria, UA  NONE SEEN NONE SEEN  CBG monitoring, ED  Result Value Ref Range   Glucose-Capillary 182 (H) 70 - 99 mg/dL   DG Abd 2 Views  Result Date: 10/17/2020 CLINICAL DATA:  DISTENDED ABDOMEN; nausea vomiting EXAM: ABDOMEN - 2 VIEW COMPARISON:  November 04, 2011, February 13, 2019 FINDINGS: Air and stool-filled nondilated loops of bowel. Moderate colonic stool burden diffusely throughout the colon. Visualized lung bases are unremarkable. Mild degenerative changes of the lumbar spine. IMPRESSION: Nonobstructive bowel gas pattern. Electronically Signed   By: Meda KlinefelterStephanie  Peacock MD   On: 10/17/2020 16:03    EKG None  Radiology DG Abd 2 Views  Result Date: 10/17/2020 CLINICAL DATA:  DISTENDED ABDOMEN; nausea vomiting EXAM: ABDOMEN - 2 VIEW COMPARISON:  November 04, 2011, February 13, 2019 FINDINGS: Air and stool-filled nondilated loops of bowel. Moderate colonic stool burden diffusely throughout the colon. Visualized lung bases are unremarkable. Mild degenerative changes of the lumbar spine. IMPRESSION: Nonobstructive bowel gas pattern. Electronically Signed   By: Meda KlinefelterStephanie  Peacock MD   On: 10/17/2020 16:03    Procedures Procedures   Medications Ordered in ED Medications  acetaminophen (TYLENOL) tablet 1,000 mg (has no administration in time range)    ED Course  I have reviewed the triage vital signs and the nursing notes.  Pertinent labs & imaging results that were available during my care of the patient were reviewed by me and considered in my medical decision making (see chart for details).    MDM Rules/Calculators/A&P                          Labs sent. Iv ns.   Reviewed nursing notes and prior charts for additional history.   Labs reviewed/interpreted by me - wbc normal. Hgb normal.   Xrays reviewed/interpreted by me - no sbo. Moderate stool.   CT is pending.   Stool is heme negative, and hgb is normal.   2330 signed out to Dr Bebe ShaggyWickline to check CT and dispo appropriately then. If CT  negative, anticipate probable d/c to home.    Final Clinical Impression(s) / ED Diagnoses Final diagnoses:  None    Rx / DC Orders ED Discharge Orders     None        Cathren LaineSteinl, Tammela Bales, MD 10/17/20 2333

## 2020-10-17 NOTE — ED Notes (Signed)
Pt given sandwich and water to drink.

## 2020-10-17 NOTE — ED Triage Notes (Signed)
Pt endorses headache and stomach ache for weeks but nausea started last night.

## 2020-10-18 ENCOUNTER — Emergency Department (HOSPITAL_COMMUNITY): Payer: Medicare Other

## 2020-10-18 DIAGNOSIS — R109 Unspecified abdominal pain: Secondary | ICD-10-CM | POA: Diagnosis not present

## 2020-10-18 LAB — RAPID URINE DRUG SCREEN, HOSP PERFORMED
Amphetamines: NOT DETECTED
Barbiturates: NOT DETECTED
Benzodiazepines: NOT DETECTED
Cocaine: NOT DETECTED
Opiates: NOT DETECTED
Tetrahydrocannabinol: NOT DETECTED

## 2020-10-18 LAB — LITHIUM LEVEL: Lithium Lvl: 0.47 mmol/L — ABNORMAL LOW (ref 0.60–1.20)

## 2020-10-18 MED ORDER — IOHEXOL 300 MG/ML  SOLN
100.0000 mL | Freq: Once | INTRAMUSCULAR | Status: AC | PRN
  Administered 2020-10-18: 100 mL via INTRAVENOUS

## 2020-10-18 NOTE — ED Provider Notes (Signed)
I assumed care to follow-up on imaging and labs.  No acute findings on labs or CT scan Patient resting comfortably in no acute distress.  He denies any complaints at this time Patient will be discharged home   EKG Interpretation  Date/Time:  Saturday October 18 2020 00:05:19 EDT Ventricular Rate:  97 PR Interval:  166 QRS Duration: 92 QT Interval:  342 QTC Calculation: 434 R Axis:   56 Text Interpretation: Normal sinus rhythm Normal ECG Confirmed by Zadie Rhine ([id number]) on 10/18/2020 12:08:44 AM          Zadie Rhine, MD 10/18/20 0131

## 2021-01-08 ENCOUNTER — Encounter (HOSPITAL_COMMUNITY): Payer: Self-pay | Admitting: Emergency Medicine

## 2021-01-08 ENCOUNTER — Emergency Department (HOSPITAL_COMMUNITY): Payer: Medicare Other

## 2021-01-08 ENCOUNTER — Emergency Department (HOSPITAL_COMMUNITY)
Admission: EM | Admit: 2021-01-08 | Discharge: 2021-01-08 | Disposition: A | Discharge status: Home / Self Care | Payer: Medicare Other | Attending: Emergency Medicine | Admitting: Emergency Medicine

## 2021-01-08 DIAGNOSIS — E1165 Type 2 diabetes mellitus with hyperglycemia: Secondary | ICD-10-CM | POA: Diagnosis not present

## 2021-01-08 DIAGNOSIS — Z794 Long term (current) use of insulin: Secondary | ICD-10-CM | POA: Diagnosis not present

## 2021-01-08 DIAGNOSIS — Z79899 Other long term (current) drug therapy: Secondary | ICD-10-CM | POA: Insufficient documentation

## 2021-01-08 DIAGNOSIS — R739 Hyperglycemia, unspecified: Secondary | ICD-10-CM

## 2021-01-08 DIAGNOSIS — Z7984 Long term (current) use of oral hypoglycemic drugs: Secondary | ICD-10-CM | POA: Insufficient documentation

## 2021-01-08 DIAGNOSIS — Z7982 Long term (current) use of aspirin: Secondary | ICD-10-CM | POA: Diagnosis not present

## 2021-01-08 DIAGNOSIS — I10 Essential (primary) hypertension: Secondary | ICD-10-CM | POA: Insufficient documentation

## 2021-01-08 DIAGNOSIS — R4182 Altered mental status, unspecified: Secondary | ICD-10-CM | POA: Insufficient documentation

## 2021-01-08 LAB — CBC
HCT: 40.7 % (ref 39.0–52.0)
Hemoglobin: 13.8 g/dL (ref 13.0–17.0)
MCH: 31 pg (ref 26.0–34.0)
MCHC: 33.9 g/dL (ref 30.0–36.0)
MCV: 91.5 fL (ref 80.0–100.0)
Platelets: 227 10*3/uL (ref 150–400)
RBC: 4.45 MIL/uL (ref 4.22–5.81)
RDW: 12.8 % (ref 11.5–15.5)
WBC: 4.1 10*3/uL (ref 4.0–10.5)
nRBC: 0 % (ref 0.0–0.2)

## 2021-01-08 LAB — URINALYSIS, ROUTINE W REFLEX MICROSCOPIC
Bacteria, UA: NONE SEEN
Bilirubin Urine: NEGATIVE
Glucose, UA: >=500 mg/dL — AB
Hgb urine dipstick: NEGATIVE
Ketones, ur: 5 mg/dL — AB
Leukocytes,Ua: NEGATIVE
Nitrite: NEGATIVE
Protein, ur: NEGATIVE mg/dL
Specific Gravity, Urine: 1.015 (ref 1.005–1.030)
pH: 7 (ref 5.0–8.0)

## 2021-01-08 LAB — BASIC METABOLIC PANEL
Anion gap: 8 (ref 5–15)
BUN: 12 mg/dL (ref 6–20)
CO2: 25 mmol/L (ref 22–32)
Calcium: 8 mg/dL — ABNORMAL LOW (ref 8.9–10.3)
Chloride: 101 mmol/L (ref 98–111)
Creatinine, Ser: 1 mg/dL (ref 0.61–1.24)
GFR, Estimated: >60 mL/min (ref >60)
Glucose, Bld: 415 mg/dL — ABNORMAL HIGH (ref 70–99)
Potassium: 3.6 mmol/L (ref 3.5–5.1)
Sodium: 134 mmol/L — ABNORMAL LOW (ref 135–145)

## 2021-01-08 LAB — CBG MONITORING, ED
Glucose-Capillary: 405 mg/dL — ABNORMAL HIGH (ref 70–99)
Glucose-Capillary: 310 mg/dL — ABNORMAL HIGH (ref 70–99)
Glucose-Capillary: 387 mg/dL — ABNORMAL HIGH (ref 70–99)

## 2021-01-08 LAB — LITHIUM LEVEL: Lithium Lvl: <0.06 mmol/L — ABNORMAL LOW (ref 0.60–1.20)

## 2021-01-08 MED ORDER — INSULIN ASPART 100 UNIT/ML IJ SOLN
5.0000 [IU] | Freq: Once | INTRAMUSCULAR | Status: AC
  Administered 2021-01-08: 5 [IU] via SUBCUTANEOUS
  Filled 2021-01-08: qty 0.05

## 2021-01-08 MED ORDER — LITHIUM CARBONATE ER 450 MG PO TBCR
450.0000 mg | EXTENDED_RELEASE_TABLET | Freq: Once | ORAL | Status: AC
  Administered 2021-01-08: 450 mg via ORAL
  Filled 2021-01-08: qty 1

## 2021-01-08 MED ORDER — SODIUM CHLORIDE 0.9 % IV BOLUS
500.0000 mL | Freq: Once | INTRAVENOUS | Status: AC
  Administered 2021-01-08: 500 mL via INTRAVENOUS

## 2021-01-08 MED ORDER — HALOPERIDOL LACTATE 5 MG/ML IJ SOLN
5.0000 mg | Freq: Once | INTRAMUSCULAR | Status: AC
  Administered 2021-01-08: 5 mg via INTRAMUSCULAR
  Filled 2021-01-08: qty 1

## 2021-01-08 NOTE — ED Triage Notes (Signed)
Patient here from home via EMS reporting hyperglycemia this morning due to forgetting to take insulin. Also reports that he is out of BP meds amlodipine. 500 ml fluid given in route.

## 2021-01-08 NOTE — Discharge Instructions (Addendum)
As discussed, your evaluation today has been largely reassuring.  But, it is important that you monitor your condition carefully, and do not hesitate to return to the ED if you develop new, or concerning changes in your condition.  Otherwise, please follow-up with your physician for appropriate ongoing care.  It is very important that you obtain your prescriptions and take them as directed.  Refills have been sent over the past few weeks and all of them should be available.

## 2021-01-08 NOTE — ED Notes (Signed)
Pt transported to CT ?

## 2021-01-08 NOTE — ED Provider Notes (Signed)
Care of the patient assumed at signout.  Patient is awake, alert, calm.  He states that he lives [place] of our affiliated facility, has inability to go obtain his prescriptions, does request refills of all of them.  He remains hemodynamically unremarkable, he is oriented appropriately, speaking clearly, has no ongoing complaints, states that he is ready to go home.  Patient found to have subtherapeutic lithium level, was provided 1 dose here, refills, as above, discharged in stable condition.   Gerhard Munch, MD 01/08/21 1630

## 2021-01-08 NOTE — ED Provider Notes (Signed)
[facility]-EMERGENCY DEPT Provider Note   CSN: [id number] Arrival date & time: 01/08/21  1206     History Chief Complaint  Patient presents with   Hyperglycemia    JUDGE DUQUE is a 60 y.o. male.  Patient is a 60 year old male who has a history of bipolar disorder, diabetes, hypertension, schizoaffective disorder who presents with his blood sugar being elevated.  He reported to EMS that he forgot to take his insulin today.  He also has been out of his blood pressure medicine.  When I ask him why he is here, he says he does not know when he feels fine.  He does state that he has been out of his medications for 2 days and he has to go pick them up.  He denies any fevers or other recent illnesses.  No vomiting.  No chest pain or shortness of breath.  No diarrhea.        Past Medical History:  Diagnosis Date   Bipolar affective disorder (HCC)    takes Zyprexa daily   Diabetes mellitus    takes Victoza,Metformin,and Glipizide daily   Hypertension    takes Amlodipine,Lisinopril and Clonidine daily   Hyponatremia    history of   Mental disorder    takes Lithium daily   Schizoaffective disorder    takes Trazodone nightly   Seasonal allergies    takes Claritin daily   Sleep apnea    sleep study >36yrs ago   Stroke [facility])    left arm weakness    Patient Active Problem List   Diagnosis Date Noted   Schizoaffective disorder (HCC) 01/04/2019   Schizophrenia (HCC) 07/18/2018   Lithium toxicity 10/02/2014   Coarse tremors 10/02/2014   Hypertension    Type 2 diabetes mellitus (HCC)    Sleep apnea    Hyponatremia 03/13/2011   Schizoaffective disorder, bipolar type (HCC) 02/28/2011    Past Surgical History:  Procedure Laterality Date   CATARACT EXTRACTION W/PHACO Right 02/14/2013   Procedure: CATARACT EXTRACTION PHACO AND INTRAOCULAR LENS PLACEMENT (IOC);  Surgeon: Shade Flood, MD;  Location: [facility] OR;  Service: Ophthalmology;  Laterality: Right;    CATARACT EXTRACTION W/PHACO Left 06/13/2013   Procedure: CATARACT EXTRACTION PHACO AND INTRAOCULAR LENS PLACEMENT (IOC);  Surgeon: Shade Flood, MD;  Location: [facility] OR;  Service: Ophthalmology;  Laterality: Left;   CIRCUMCISION  20 yrs. ago   EYE SURGERY         No family history on file.  Social History   Tobacco Use   Smoking status: Never   Smokeless tobacco: Never  Vaping Use   Vaping Use: Never used  Substance Use Topics   Alcohol use: Yes    Comment: "a whole lot of wine every day"   Drug use: No    Home Medications Prior to Admission medications   Medication Sig Start Date End Date Taking? Authorizing Provider  amLODipine (NORVASC) 10 MG tablet Take 1 tablet (10 mg total) by mouth daily. 01/11/19   Malvin Johns, MD  aspirin 81 MG EC tablet Take 1 tablet (81 mg total) by mouth daily. Swallow whole. 01/11/19   Malvin Johns, MD  benztropine (COGENTIN) 1 MG tablet Take 1 tablet (1 mg total) by mouth 2 (two) times daily. 01/10/19   Malvin Johns, MD  cloNIDine (CATAPRES) 0.2 MG tablet Take 0.2 mg by mouth 2 (two) times daily.    [provider]  fluticasone (FLONASE) 50 MCG/ACT nasal spray Place 1 spray into both nostrils daily  as needed for allergies.     [provider]  glipiZIDE (GLUCOTROL XL) 10 MG 24 hr tablet Take 1 tablet (10 mg total) by mouth daily with breakfast. 01/11/19   Malvin Johns, MD  haloperidol (HALDOL) 20 MG tablet Take 1 tablet (20 mg total) by mouth at bedtime. 01/10/19   Malvin Johns, MD  insulin glargine (LANTUS) 100 UNIT/ML injection Inject 10 Units into the skin daily.    [provider]  insulin lispro (HUMALOG) 100 UNIT/ML KwikPen Inject 2-10 Units into the skin 3 (three) times daily. Per sliding scale 09/25/18   [provider]  lactulose (CHRONULAC) 10 GM/15ML solution Take 30 mLs by mouth daily as needed for mild constipation.  12/28/19   [provider]  LEVEMIR FLEXTOUCH 100 UNIT/ML FlexPen Inject 20 Units  into the skin at bedtime. 12/28/19   [provider]  liraglutide (VICTOZA) 18 MG/3ML SOPN Inject 1.8 mg into the skin daily.    [provider]  lisinopril (ZESTRIL) 40 MG tablet Take 1 tablet (40 mg total) by mouth daily. Patient not taking: Reported on 01/11/2020 01/11/19   Malvin Johns, MD  lithium carbonate (ESKALITH) 450 MG CR tablet Take 1 tablet (450 mg total) by mouth every 12 (twelve) hours. Patient taking differently: Take 450 mg by mouth at bedtime.  01/10/19   Malvin Johns, MD  metFORMIN (GLUCOPHAGE) 1000 MG tablet Take 1 tablet (1,000 mg total) by mouth 2 (two) times daily with a meal. 01/10/19   Malvin Johns, MD  NOVOLOG FLEXPEN 100 UNIT/ML FlexPen Inject 5 Units into the skin with breakfast, with lunch, and with evening meal. 12/28/19   [provider]    Allergies    Patient has no known allergies.  Review of Systems   Review of Systems  Constitutional:  Negative for chills, diaphoresis, fatigue and fever.  HENT:  Negative for congestion, rhinorrhea and sneezing.   Eyes: Negative.   Respiratory:  Negative for cough, chest tightness and shortness of breath.   Cardiovascular:  Negative for chest pain and leg swelling.  Gastrointestinal:  Negative for abdominal pain, blood in stool, diarrhea, nausea and vomiting.  Genitourinary:  Negative for difficulty urinating, flank pain, frequency and hematuria.  Musculoskeletal:  Negative for arthralgias and back pain.  Skin:  Negative for rash.  Neurological:  Negative for dizziness, speech difficulty, weakness, numbness and headaches.   Physical Exam Updated Vital Signs BP (!) 167/102   Pulse 84   Temp 99.4 F (37.4 C) (Oral)   Resp 16   SpO2 99%   Physical Exam Constitutional:      Appearance: He is well-developed.  HENT:     Head: Normocephalic and atraumatic.  Eyes:     Pupils: Pupils are equal, round, and reactive to light.  Cardiovascular:     Rate and Rhythm: Normal rate and regular  rhythm.     Heart sounds: Normal heart sounds.  Pulmonary:     Effort: Pulmonary effort is normal. No respiratory distress.     Breath sounds: Normal breath sounds. No wheezing or rales.  Chest:     Chest wall: No tenderness.  Abdominal:     General: Bowel sounds are normal.     Palpations: Abdomen is soft.     Tenderness: There is no abdominal tenderness. There is no guarding or rebound.  Musculoskeletal:        General: Normal range of motion.     Cervical back: Normal range of motion and neck supple.  Lymphadenopathy:     Cervical: No cervical adenopathy.  Skin:    General: Skin is warm and dry.     Findings: No rash.  Neurological:     General: No focal deficit present.     Mental Status: He is alert.     Comments: Patient is oriented to place.  He tells me his name is Mitchell [name].  He cannot tell me the month or the year.    ED Results / Procedures / Treatments   Labs (all labs ordered are listed, but only abnormal results are displayed) Labs Reviewed  BASIC METABOLIC PANEL - Abnormal; Notable for the following components:      Result Value   Sodium 134 (*)    Glucose, Bld 415 (*)    Calcium 8.0 (*)    All other components within normal limits  URINALYSIS, ROUTINE W REFLEX MICROSCOPIC - Abnormal; Notable for the following components:   Color, Urine STRAW (*)    Glucose, UA >=500 (*)    Ketones, ur 5 (*)    All other components within normal limits  CBG MONITORING, ED - Abnormal; Notable for the following components:   Glucose-Capillary 405 (*)    All other components within normal limits  CBG MONITORING, ED - Abnormal; Notable for the following components:   Glucose-Capillary 310 (*)    All other components within normal limits  CBC  LITHIUM LEVEL  CBG MONITORING, ED    EKG None  Radiology CT Head Wo Contrast  Result Date: 01/08/2021 CLINICAL DATA:  Mental status change.  Diabetic EXAM: CT HEAD WITHOUT CONTRAST TECHNIQUE: Contiguous axial images were  obtained from the base of the skull through the vertex without intravenous contrast. COMPARISON:  CT head 10/02/2014 FINDINGS: Brain: Ventricle size within normal limits. Mild cortical atrophy with progression. Patchy white matter hypodensity bilaterally has progressed in the interval. Negative for acute infarct, hemorrhage, mass Vascular: Negative for hyperdense vessel Skull: Negative Sinuses/Orbits: Mild mucosal edema paranasal sinuses. Bilateral cataract extraction Other: None IMPRESSION: No acute abnormality. Chronic microvascular ischemic change with progression since 2016. Electronically Signed   By: Marlan Palau M.D.   On: 01/08/2021 14:11    Procedures Procedures   Medications Ordered in ED Medications  sodium chloride 0.9 % bolus 500 mL (0 mLs Intravenous Stopped 01/08/21 1440)  insulin aspart (novoLOG) injection 5 Units (5 Units Subcutaneous Given 01/08/21 1446)  haloperidol lactate (HALDOL) injection 5 mg (5 mg Intramuscular Given 01/08/21 1446)    ED Course  I have reviewed the triage vital signs and the nursing notes.  Pertinent labs & imaging results that were available during my care of the patient were reviewed by me and considered in my medical decision making (see chart for details).    MDM Rules/Calculators/A&P                           Patient is a 60 year old male who presents with hyperglycemia.  He says he has not been taking his medications although he says he has them available to pick up.  He is not answering my orientation questions appropriately although at times and feels like he is choosing how to answer his questions.  He does have a psychiatric history although he denies hallucinations.  He does not appear to be overtly psychotic.  His glucose is elevated in the 400 range.  He has no suggestions of DKA.  His other labs are nonconcerning.  His head CT shows  no acute abnormality.  He was given IV fluids although he is taken his IV out twice following this so we  did not put it back in.  He was given a dose of subcu insulin and I also gave him a dose of Haldol given that he does take Haldol at home and it does not appear that he has been compliant with his medications.  We will need reassessment following this.  Dr. Jeraldine Loots to take over care. Final Clinical Impression(s) / ED Diagnoses Final diagnoses:  None    Rx / DC Orders ED Discharge Orders     None        Rolan Bucco, MD 01/08/21 1525

## 2021-01-21 ENCOUNTER — Emergency Department (HOSPITAL_COMMUNITY)
Admission: EM | Admit: 2021-01-21 | Discharge: 2021-01-21 | Discharge status: Against Medical Advice | Payer: Medicare Other | Attending: Emergency Medicine | Admitting: Emergency Medicine

## 2021-01-21 ENCOUNTER — Encounter (HOSPITAL_COMMUNITY): Payer: Self-pay | Admitting: Emergency Medicine

## 2021-01-21 DIAGNOSIS — F209 Schizophrenia, unspecified: Secondary | ICD-10-CM | POA: Diagnosis not present

## 2021-01-21 DIAGNOSIS — Z5321 Procedure and treatment not carried out due to patient leaving prior to being seen by health care provider: Secondary | ICD-10-CM | POA: Diagnosis not present

## 2021-01-21 DIAGNOSIS — E119 Type 2 diabetes mellitus without complications: Secondary | ICD-10-CM | POA: Insufficient documentation

## 2021-01-21 DIAGNOSIS — Z79899 Other long term (current) drug therapy: Secondary | ICD-10-CM | POA: Diagnosis not present

## 2021-01-21 DIAGNOSIS — I10 Essential (primary) hypertension: Secondary | ICD-10-CM | POA: Diagnosis not present

## 2021-01-21 DIAGNOSIS — T43596A Underdosing of other antipsychotics and neuroleptics, initial encounter: Secondary | ICD-10-CM | POA: Diagnosis not present

## 2021-01-21 LAB — CBC WITH DIFFERENTIAL/PLATELET
Abs Immature Granulocytes: 0.01 10*3/uL (ref 0.00–0.07)
Basophils Absolute: 0 10*3/uL (ref 0.0–0.1)
Basophils Relative: 1 %
Eosinophils Absolute: 0.3 10*3/uL (ref 0.0–0.5)
Eosinophils Relative: 4 %
HCT: 45 % (ref 39.0–52.0)
Hemoglobin: 15.4 g/dL (ref 13.0–17.0)
Immature Granulocytes: 0 %
Lymphocytes Relative: 32 %
Lymphs Abs: 1.9 10*3/uL (ref 0.7–4.0)
MCH: 31.2 pg (ref 26.0–34.0)
MCHC: 34.2 g/dL (ref 30.0–36.0)
MCV: 91.3 fL (ref 80.0–100.0)
Monocytes Absolute: 0.6 10*3/uL (ref 0.1–1.0)
Monocytes Relative: 9 %
Neutro Abs: 3.2 10*3/uL (ref 1.7–7.7)
Neutrophils Relative %: 54 %
Platelets: 271 10*3/uL (ref 150–400)
RBC: 4.93 MIL/uL (ref 4.22–5.81)
RDW: 13 % (ref 11.5–15.5)
WBC: 5.9 10*3/uL (ref 4.0–10.5)
nRBC: 0 % (ref 0.0–0.2)

## 2021-01-21 LAB — COMPREHENSIVE METABOLIC PANEL
ALT: 24 U/L (ref 0–44)
AST: 22 U/L (ref 15–41)
Albumin: 4 g/dL (ref 3.5–5.0)
Alkaline Phosphatase: 96 U/L (ref 38–126)
Anion gap: 10 (ref 5–15)
BUN: 17 mg/dL (ref 6–20)
CO2: 27 mmol/L (ref 22–32)
Calcium: 9.5 mg/dL (ref 8.9–10.3)
Chloride: 97 mmol/L — ABNORMAL LOW (ref 98–111)
Creatinine, Ser: 1.39 mg/dL — ABNORMAL HIGH (ref 0.61–1.24)
GFR, Estimated: 58 mL/min — ABNORMAL LOW (ref >60)
Glucose, Bld: 316 mg/dL — ABNORMAL HIGH (ref 70–99)
Potassium: 4.1 mmol/L (ref 3.5–5.1)
Sodium: 134 mmol/L — ABNORMAL LOW (ref 135–145)
Total Bilirubin: 1 mg/dL (ref 0.3–1.2)
Total Protein: 7.7 g/dL (ref 6.5–8.1)

## 2021-01-21 LAB — RAPID URINE DRUG SCREEN, HOSP PERFORMED
Amphetamines: NOT DETECTED
Barbiturates: NOT DETECTED
Benzodiazepines: NOT DETECTED
Cocaine: NOT DETECTED
Opiates: NOT DETECTED
Tetrahydrocannabinol: NOT DETECTED

## 2021-01-21 LAB — LITHIUM LEVEL: Lithium Lvl: 0.47 mmol/L — ABNORMAL LOW (ref 0.60–1.20)

## 2021-01-21 LAB — ETHANOL: Alcohol, Ethyl (B): <10 mg/dL (ref <10)

## 2021-01-21 NOTE — ED Triage Notes (Signed)
Per EMS, patient sent to ED because he will not take his medications, history hypertension and schizophrenia. Patient denies any complaints, is calm and cooperative, denies SI/HI.

## 2021-01-21 NOTE — ED Provider Notes (Signed)
Emergency Medicine Provider Triage Evaluation Note  Mitchell [name] , a 60 y.o. male  was evaluated in triage.  Brought in by EMS as family reports he is not taking his medication for hypertension, schizophrenia, and diabetes.  Patient is calm and cooperative on my exam, is denying any complaints at this time.  He is unsure as to why he is here.  No follow-up information given by EMS.  Patient states that a woman named Mitchell [name], that he does not know, is the one who called for him today.  He is unsure if he traveled here in an ambulance or a "red car with [employer] supplies".  He denies suicidal and homicidal ideation stating, "I am not that crazy".  Denies visual or auditory hallucinations.  Denies history of schizophrenia, on chart review patient is supposed to be taking lithium for this.  Review of Systems  Positive: No complaints at this time Negative: Suicidal or homicidal ideation, visual or auditory hallucinations  Physical Exam  BP (!) 174/92 (BP Location: Right Arm)   Pulse 63   Temp 98.8 F (37.1 C) (Oral)   Resp 18   SpO2 100%  Gen:   Awake, no distress   Resp:  Normal effort  MSK:   Moves extremities without difficulty  Other:    Medical Decision Making  Medically screening exam initiated at 4:24 PM.  Appropriate orders placed.  [name] was informed that the remainder of the evaluation will be completed by another provider, this initial triage assessment does not replace that evaluation, and the importance of remaining in the ED until their evaluation is complete.  Plan to obtain medical screening labs, including lithium level.   Jeanella Flattery 01/21/21 1626    Charlynne Pander, MD 01/21/21 2220

## 2021-01-21 NOTE — ED Notes (Signed)
Patient called x2 for vitals recheck with no response and not visible in lobby 

## 2021-01-26 ENCOUNTER — Other Ambulatory Visit: Payer: Self-pay

## 2021-01-26 ENCOUNTER — Emergency Department (HOSPITAL_COMMUNITY)
Admission: EM | Admit: 2021-01-26 | Discharge: 2021-01-28 | Disposition: A | Discharge status: Other Acute Inpatient Hospital | Payer: Medicare Other | Source: Home / Self Care | Attending: Emergency Medicine | Admitting: Emergency Medicine

## 2021-01-26 ENCOUNTER — Ambulatory Visit (HOSPITAL_COMMUNITY)
Admission: EM | Admit: 2021-01-26 | Discharge: 2021-01-26 | Disposition: A | Discharge status: Other Acute Inpatient Hospital | Payer: Medicare Other | Attending: Psychiatry | Admitting: Psychiatry

## 2021-01-26 DIAGNOSIS — I10 Essential (primary) hypertension: Secondary | ICD-10-CM | POA: Insufficient documentation

## 2021-01-26 DIAGNOSIS — Z7984 Long term (current) use of oral hypoglycemic drugs: Secondary | ICD-10-CM | POA: Insufficient documentation

## 2021-01-26 DIAGNOSIS — E119 Type 2 diabetes mellitus without complications: Secondary | ICD-10-CM | POA: Insufficient documentation

## 2021-01-26 DIAGNOSIS — Z794 Long term (current) use of insulin: Secondary | ICD-10-CM | POA: Insufficient documentation

## 2021-01-26 DIAGNOSIS — Z7982 Long term (current) use of aspirin: Secondary | ICD-10-CM | POA: Insufficient documentation

## 2021-01-26 DIAGNOSIS — Z79899 Other long term (current) drug therapy: Secondary | ICD-10-CM | POA: Insufficient documentation

## 2021-01-26 DIAGNOSIS — F209 Schizophrenia, unspecified: Secondary | ICD-10-CM | POA: Diagnosis not present

## 2021-01-26 DIAGNOSIS — Z20822 Contact with and (suspected) exposure to covid-19: Secondary | ICD-10-CM | POA: Insufficient documentation

## 2021-01-26 DIAGNOSIS — F29 Unspecified psychosis not due to a substance or known physiological condition: Secondary | ICD-10-CM | POA: Insufficient documentation

## 2021-01-26 DIAGNOSIS — F250 Schizoaffective disorder, bipolar type: Secondary | ICD-10-CM | POA: Diagnosis not present

## 2021-01-26 LAB — CBC WITH DIFFERENTIAL/PLATELET
Abs Immature Granulocytes: 0.01 10*3/uL (ref 0.00–0.07)
Basophils Absolute: 0.1 10*3/uL (ref 0.0–0.1)
Basophils Relative: 1 %
Eosinophils Absolute: 0.3 10*3/uL (ref 0.0–0.5)
Eosinophils Relative: 4 %
HCT: 45.2 % (ref 39.0–52.0)
Hemoglobin: 14.7 g/dL (ref 13.0–17.0)
Immature Granulocytes: 0 %
Lymphocytes Relative: 39 %
Lymphs Abs: 2.7 10*3/uL (ref 0.7–4.0)
MCH: 30.9 pg (ref 26.0–34.0)
MCHC: 32.5 g/dL (ref 30.0–36.0)
MCV: 95 fL (ref 80.0–100.0)
Monocytes Absolute: 0.7 10*3/uL (ref 0.1–1.0)
Monocytes Relative: 10 %
Neutro Abs: 3.3 10*3/uL (ref 1.7–7.7)
Neutrophils Relative %: 46 %
Platelets: 277 10*3/uL (ref 150–400)
RBC: 4.76 MIL/uL (ref 4.22–5.81)
RDW: 12.8 % (ref 11.5–15.5)
WBC: 7.1 10*3/uL (ref 4.0–10.5)
nRBC: 0 % (ref 0.0–0.2)

## 2021-01-26 LAB — COMPREHENSIVE METABOLIC PANEL
ALT: 26 U/L (ref 0–44)
AST: 30 U/L (ref 15–41)
Albumin: 4.3 g/dL (ref 3.5–5.0)
Alkaline Phosphatase: 105 U/L (ref 38–126)
Anion gap: 11 (ref 5–15)
BUN: 13 mg/dL (ref 6–20)
CO2: 25 mmol/L (ref 22–32)
Calcium: 9.2 mg/dL (ref 8.9–10.3)
Chloride: 101 mmol/L (ref 98–111)
Creatinine, Ser: 1.45 mg/dL — ABNORMAL HIGH (ref 0.61–1.24)
GFR, Estimated: 56 mL/min — ABNORMAL LOW (ref >60)
Glucose, Bld: 269 mg/dL — ABNORMAL HIGH (ref 70–99)
Potassium: 4 mmol/L (ref 3.5–5.1)
Sodium: 137 mmol/L (ref 135–145)
Total Bilirubin: 1.4 mg/dL — ABNORMAL HIGH (ref 0.3–1.2)
Total Protein: 7.9 g/dL (ref 6.5–8.1)

## 2021-01-26 LAB — RESP PANEL BY RT-PCR (FLU A&B, COVID) ARPGX2
Influenza A by PCR: NEGATIVE
Influenza B by PCR: NEGATIVE
SARS Coronavirus 2 by RT PCR: NEGATIVE

## 2021-01-26 LAB — ETHANOL: Alcohol, Ethyl (B): <10 mg/dL (ref <10)

## 2021-01-26 LAB — CBG MONITORING, ED: Glucose-Capillary: 381 mg/dL — ABNORMAL HIGH (ref 70–99)

## 2021-01-26 MED ORDER — LITHIUM CARBONATE ER 450 MG PO TBCR
450.0000 mg | EXTENDED_RELEASE_TABLET | Freq: Two times a day (BID) | ORAL | Status: DC
  Administered 2021-01-26 – 2021-01-28 (×4): 450 mg via ORAL
  Filled 2021-01-26 (×6): qty 1

## 2021-01-26 MED ORDER — GLIPIZIDE ER 10 MG PO TB24
10.0000 mg | ORAL_TABLET | Freq: Every day | ORAL | Status: DC
  Administered 2021-01-27 – 2021-01-28 (×2): 10 mg via ORAL
  Filled 2021-01-26 (×2): qty 1

## 2021-01-26 MED ORDER — ASPIRIN EC 81 MG PO TBEC
81.0000 mg | DELAYED_RELEASE_TABLET | Freq: Every day | ORAL | Status: DC
  Administered 2021-01-26 – 2021-01-28 (×3): 81 mg via ORAL
  Filled 2021-01-26 (×3): qty 1

## 2021-01-26 MED ORDER — BENZTROPINE MESYLATE 1 MG PO TABS
1.0000 mg | ORAL_TABLET | Freq: Two times a day (BID) | ORAL | Status: DC
  Administered 2021-01-26 – 2021-01-28 (×4): 1 mg via ORAL
  Filled 2021-01-26 (×6): qty 1

## 2021-01-26 MED ORDER — INSULIN ASPART 100 UNIT/ML IJ SOLN
0.0000 [IU] | Freq: Every day | INTRAMUSCULAR | Status: DC
Start: 2021-01-26 — End: 2021-01-28
  Administered 2021-01-26: 5 [IU] via SUBCUTANEOUS

## 2021-01-26 MED ORDER — HALOPERIDOL 5 MG PO TABS
20.0000 mg | ORAL_TABLET | Freq: Every day | ORAL | Status: DC
  Administered 2021-01-26 – 2021-01-27 (×2): 20 mg via ORAL
  Filled 2021-01-26 (×2): qty 4

## 2021-01-26 MED ORDER — CLONIDINE HCL 0.2 MG PO TABS
0.2000 mg | ORAL_TABLET | Freq: Two times a day (BID) | ORAL | Status: DC
  Administered 2021-01-26 – 2021-01-28 (×4): 0.2 mg via ORAL
  Filled 2021-01-26 (×4): qty 1

## 2021-01-26 MED ORDER — LIRAGLUTIDE 18 MG/3ML ~~LOC~~ SOPN: 1.8000 mg | PEN_INJECTOR | Freq: Every day | SUBCUTANEOUS | Status: DC

## 2021-01-26 MED ORDER — INSULIN ASPART 100 UNIT/ML IJ SOLN
6.0000 [IU] | Freq: Three times a day (TID) | INTRAMUSCULAR | Status: DC
  Administered 2021-01-27 – 2021-01-28 (×4): 6 [IU] via SUBCUTANEOUS

## 2021-01-26 MED ORDER — LISINOPRIL 20 MG PO TABS
40.0000 mg | ORAL_TABLET | Freq: Every day | ORAL | Status: DC
  Administered 2021-01-26 – 2021-01-28 (×3): 40 mg via ORAL
  Filled 2021-01-26 (×3): qty 2

## 2021-01-26 MED ORDER — AMLODIPINE BESYLATE 5 MG PO TABS
10.0000 mg | ORAL_TABLET | Freq: Every day | ORAL | Status: DC
Start: 2021-01-26 — End: 2021-01-28
  Administered 2021-01-26 – 2021-01-28 (×3): 10 mg via ORAL
  Filled 2021-01-26 (×3): qty 2

## 2021-01-26 MED ORDER — INSULIN ASPART 100 UNIT/ML IJ SOLN
0.0000 [IU] | Freq: Three times a day (TID) | INTRAMUSCULAR | Status: DC
  Administered 2021-01-27: 11 [IU] via SUBCUTANEOUS
  Administered 2021-01-27: 15 [IU] via SUBCUTANEOUS
  Administered 2021-01-28: 8 [IU] via SUBCUTANEOUS
  Administered 2021-01-28: 5 [IU] via SUBCUTANEOUS

## 2021-01-26 NOTE — ED Notes (Addendum)
Pt sitting on stretcher, talking too low to make out what he is saying. But Pt appears to be responding to internal stimulation as he is looking towards the wall as if in a conversation with someone.  A few minutes later patient said "Doc, are you going to let me sit down here or are you going to put me in the ICU." When asked why he needs the ICU, pt responds that "Y'all said you were going to put me in there, make up your mind"   Pt stated "go on and keep the food and just leave it sitting there. Just keep on going. Have a nice day" Dietary had been delivering trays about 10 minutes prior to other patients but had not been by since.   Writer informed pt that his tray would be delivered soon. Pt responded "I'll see it when I believe it"

## 2021-01-26 NOTE — Discharge Summary (Signed)
Mitchell [name] transferred to  [facility]  per NP order. An After Visit Summary, EMTALA and Med Necessity were printed and to be given to the receiving nurse. Report given to [place], CN, [facility]. Patient escorted out and transported via [employer].  [name]  01/26/2021 1:58 PM

## 2021-01-26 NOTE — ED Provider Notes (Signed)
Emergency Medicine Provider Triage Evaluation Note  Mitchell [name] , a 60 y.o. male  was evaluated in triage.  Pt here under IVC from [facility] for auditory hallucinations and delusional thoughts.  IVC paperwork states the police has been to his residency 3 times in the past week, for reports of patient running outside naked and trying to jump into his trash compactor.  History of schizophrenia, but is refusing to take his medications.  Noted to behavioral health today that he feels he "needs to be put in a straight jacket".  [facility] consulted for ED transfer, Dr. Lockie Mola accepting physician  Patient denies suicidal ideation, homicidal ideation, denies auditory or visual hallucinations.  However he appears to be responding to external stimuli in triage.  He denies history of schizophrenia, denies requiring any medication for this.  He states he is unsure as to why he is here, "nothing is wrong with me"and "I am not that crazy".  Patient also has a history of diabetes, states he last took insulin this morning, but urgent care note states that he has been refusing to take his insulin and last documented blood sugar was on 10/25 and was noted to be 316.  Review of Systems  Positive: Auditory hallucinations, delusional thoughts Negative: Suicidal ideation, homicidal ideation, no hallucinations  Physical Exam  BP (!) 175/106   Pulse 100   Temp 98.5 F (36.9 C)   Resp 16   SpO2 98%  Gen:   Awake, no distress   Resp:  Normal effort  MSK:   Moves extremities without difficulty  Other:  Calm and cooperative in triage, makes minimal eye contact  Medical Decision Making  Medically screening exam initiated at 3:58 PM.  Appropriate orders placed.  [name] was informed that the remainder of the evaluation will be completed by another provider, this initial triage assessment does not replace that evaluation, and the importance of remaining in the ED until their evaluation is complete.  Medical  clearance and psych ordered   Su Monks, PA-C 01/26/21 1603    Rozelle Logan, DO 01/27/21 8527

## 2021-01-26 NOTE — ED Provider Notes (Signed)
Behavioral Health Urgent Care Medical Screening Exam  Patient Name: Mitchell [name] MRN: [id number] Date of Evaluation: 01/26/21 Chief Complaint:   Diagnosis:  Final diagnoses:  Schizophrenia, unspecified type (HCC)    History of Present illness: Mitchell [name] is a 60 y.o. male patient presented to [facility] as a walk in under IVC accompanied by [employer] with complaints of "I don't know why I am here".  Mitchell [name], 60 y.o., male patient seen face to face by this provider, consulted with Dr. Bronwen Betters; and chart reviewed on 01/26/21.  Patient has a history of schizophrenia, hypertension, and diabetes.  Per IVC paperwork "patient has been running out of his house naked and trying to jump into the trash compactor to kill himself.  He refuses to take his medications including insulin and  was recently hospitalized due to high blood sugar".  On evaluation Mitchell [name] is sitting in a chair with his arms crossed.  He makes minimal eye contact.  He is alert and oriented x3.  He is calm at this time.  His speech is clear, coherent, normal rate and tone.  He is guarded with questions and looks around the room.  He appears to be slightly paranoid.  He answers most questions with "no".  This writer was unable to properly assess patient's thought process due to his limited responses .  He does not appear to be currently responding to internal/external stimuli.  He denies SI/HI/AVH.  Due to patient's recent hospitalization due to high blood glucose, his last charted result was 316 on 01/20/2021 and him refusing to take his insulin.  He will be transferred to [facility] emergency department accepting physician is Dr. Lockie Mola. Once patient is medically cleared patient will be reevaluated by psychiatry.   Psychiatric Specialty Exam  Presentation  General Appearance:Fairly Groomed  Eye Contact:Minimal  Speech:Clear and Coherent; Normal Rate  Speech Volume:Normal  Handedness:Right   Mood and Affect   Mood:Anxious  Affect:Congruent   Thought Process  Thought Processes:Coherent  Descriptions of Associations:Intact  Orientation:Full (Time, Place and Person)  Thought Content:Paranoid Ideation  Diagnosis of Schizophrenia or Schizoaffective disorder in past: Yes  Duration of Psychotic Symptoms: Greater than six months  Hallucinations:None  Ideas of Reference:None  Suicidal Thoughts:No data recorded Homicidal Thoughts:No   Sensorium  Memory:Immediate Poor; Recent Poor; Remote Poor  Judgment:Poor  Insight:Poor   Executive Functions  Concentration:Fair  Attention Span:Fair  Recall:Fair  Fund of Knowledge:Fair  Language:Good   Psychomotor Activity  Psychomotor Activity:Normal   Assets  Assets:Financial Resources/Insurance; Physical Health; Resilience; Social Support   Sleep  Sleep:Good  Number of hours: No data recorded  No data recorded  Physical Exam: Physical Exam Vitals and nursing note reviewed.  Constitutional:      Appearance: Normal appearance.  HENT:     Head: Normocephalic.  Eyes:     General:        Right eye: No discharge.        Left eye: No discharge.     Conjunctiva/sclera: Conjunctivae normal.  Cardiovascular:     Rate and Rhythm: Normal rate.  Pulmonary:     Effort: No respiratory distress.  Musculoskeletal:        General: Normal range of motion.     Cervical back: Normal range of motion.  Skin:    Coloration: Skin is not jaundiced or pale.  Neurological:     Mental Status: He is alert and oriented to person, place, and time.  Psychiatric:  Attention and Perception: Attention normal.        Mood and Affect: Mood is anxious.        Speech: Speech normal.        Behavior: Behavior is withdrawn.        Thought Content: Thought content is paranoid.        Cognition and Memory: Cognition normal.        Judgment: Judgment is impulsive.   Review of Systems  Constitutional: Negative.   HENT: Negative.    Eyes:  Negative.   Respiratory: Negative.    Cardiovascular: Negative.   Musculoskeletal: Negative.   Skin: Negative.   Neurological: Negative.   Psychiatric/Behavioral:  The patient is nervous/anxious.   Blood pressure (!) 152/94, pulse (!) 105, temperature 98.7 F (37.1 C), resp. rate 18, SpO2 95 %. There is no height or weight on file to calculate BMI.  Musculoskeletal: Strength & Muscle Tone: within normal limits Gait & Station: normal Patient leans: N/A   [facility] MSE Discharge Disposition for Follow up and Recommendations: Based on my evaluation the patient appears to have an emergency medical condition for which I recommend the patient be transferred to the emergency department for further evaluation.   Due to patient's recent hospitalization due to high blood glucose, his last charted result was 316 on 01/20/2021 and him refusing to take his insulin.  He will be transferred to [facility] emergency department accepting physician is Dr. Lockie Mola. Once patient is medically cleared patient will be reevaluated by psychiatry.   Ardis Hughs, NP 01/26/2021, 2:09 PM

## 2021-01-26 NOTE — ED Triage Notes (Signed)
Pt with hx of schizophrenia here via [employer] after being IVC'd. Pt was taken to [facility] via [employer] and they were redirected to come here by [facility] staff. Denies SI/HI and appears to be responding to external stimuli in triage. Calm and cooperative.

## 2021-01-26 NOTE — ED Provider Notes (Signed)
[place] [facility] EMERGENCY DEPARTMENT Provider Note   CSN: [id number] Arrival date & time: 01/26/21  1409     History No chief complaint on file.   Mitchell [name] is a 60 y.o. male.  The history is provided by the patient, the police and medical records. No language interpreter was used.   60 year old male significant history of bipolar, diabetes, hypertension, schizophrenia, brought here accompanied by [employer] under IVC sent from [facility] for auditory hallucination and delusional thoughts.  Per IVC paperwork, please have been out to his residency 3 times in the past week for reports of patient running outside naked and trying to jump into his trash compactor.  Furthermore history of schizophrenia but patient refused to take his medication.  Patient however denies SI HI denies auditory or visual loose [place] denies any recent medication changes denied feeling depressed and denies self-medicating with alcohol or drugs.  He does not know why he is here.  [employer] is at bedside.  Past Medical History:  Diagnosis Date   Bipolar affective disorder ([place])    takes Zyprexa daily   Diabetes mellitus    takes Victoza,Metformin,and Glipizide daily   Hypertension    takes Amlodipine,Lisinopril and Clonidine daily   Hyponatremia    history of   Mental disorder    takes Lithium daily   Schizoaffective disorder    takes Trazodone nightly   Seasonal allergies    takes Claritin daily   Sleep apnea    sleep study >72yrs ago   Stroke [facility])    left arm weakness    Patient Active Problem List   Diagnosis Date Noted   Schizoaffective disorder ([place]) 01/04/2019   Schizophrenia ([place]) 07/18/2018   Lithium toxicity 10/02/2014   Coarse tremors 10/02/2014   Hypertension    Type 2 diabetes mellitus ([place])    Sleep apnea    Hyponatremia 03/13/2011   Schizoaffective disorder, bipolar type ([place]) 02/28/2011    Past Surgical History:  Procedure Laterality Date   CATARACT EXTRACTION W/PHACO Right  02/14/2013   Procedure: CATARACT EXTRACTION PHACO AND INTRAOCULAR LENS PLACEMENT ([place]);  Surgeon: Adonis Brook, MD;  Location: [place];  Service: Ophthalmology;  Laterality: Right;   CATARACT EXTRACTION W/PHACO Left 06/13/2013   Procedure: CATARACT EXTRACTION PHACO AND INTRAOCULAR LENS PLACEMENT (I[facility]);  Surgeon: Adonis Brook, MD;  Location: [place];  Service: Ophthalmology;  Laterality: Left;   CIRCUMCISION  20 yrs. ago   EYE SURGERY         No family history on file.  Social History   Tobacco Use   Smoking status: Never   Smokeless tobacco: Never  Vaping Use   Vaping Use: Never used  Substance Use Topics   Alcohol use: Yes    Comment: "a whole lot of wine every day"   Drug use: No    Home Medications Prior to Admission medications   Medication Sig Start Date End Date Taking? Authorizing Provider  amLODipine (NORVASC) 10 MG tablet Take 1 tablet (10 mg total) by mouth daily. 01/11/19   Mitchell Hai, MD  aspirin 81 MG EC tablet Take 1 tablet (81 mg total) by mouth daily. Swallow whole. 01/11/19   Mitchell Hai, MD  benztropine (COGENTIN) 1 MG tablet Take 1 tablet (1 mg total) by mouth 2 (two) times daily. 01/10/19   Mitchell Hai, MD  cloNIDine (CATAPRES) 0.2 MG tablet Take 0.2 mg by mouth 2 (two) times daily.    [provider]  fluticasone (FLONASE) 50 MCG/ACT nasal spray Place  1 spray into both nostrils daily as needed for allergies.     [provider]  glipiZIDE (GLUCOTROL XL) 10 MG 24 hr tablet Take 1 tablet (10 mg total) by mouth daily with breakfast. 01/11/19   Mitchell Hai, MD  haloperidol (HALDOL) 20 MG tablet Take 1 tablet (20 mg total) by mouth at bedtime. 01/10/19   Mitchell Hai, MD  insulin glargine (LANTUS) 100 UNIT/ML injection Inject 10 Units into the skin daily.    [provider]  insulin lispro (HUMALOG) 100 UNIT/ML KwikPen Inject 2-10 Units into the skin 3 (three) times daily. Per sliding scale 09/25/18   [provider]  lactulose  (CHRONULAC) 10 GM/15ML solution Take 30 mLs by mouth daily as needed for mild constipation.  12/28/19   [provider]  LEVEMIR FLEXTOUCH 100 UNIT/ML FlexPen Inject 20 Units into the skin at bedtime. 12/28/19   [provider]  liraglutide (VICTOZA) 18 MG/3ML SOPN Inject 1.8 mg into the skin daily.    [provider]  lisinopril (ZESTRIL) 40 MG tablet Take 1 tablet (40 mg total) by mouth daily. Patient not taking: Reported on 01/11/2020 01/11/19   Mitchell Hai, MD  lithium carbonate (ESKALITH) 450 MG CR tablet Take 1 tablet (450 mg total) by mouth every 12 (twelve) hours. Patient taking differently: Take 450 mg by mouth at bedtime.  01/10/19   Mitchell Hai, MD  metFORMIN (GLUCOPHAGE) 1000 MG tablet Take 1 tablet (1,000 mg total) by mouth 2 (two) times daily with a meal. 01/10/19   Mitchell Hai, MD  NOVOLOG FLEXPEN 100 UNIT/ML FlexPen Inject 5 Units into the skin with breakfast, with lunch, and with evening meal. 12/28/19   [provider]    Allergies    Patient has no known allergies.  Review of Systems   Review of Systems  All other systems reviewed and are negative.  Physical Exam Updated Vital Signs BP (!) 175/106   Pulse 100   Temp 98.5 F (36.9 C)   Resp 16   SpO2 98%   Physical Exam Vitals and nursing note reviewed.  Constitutional:      General: He is not in acute distress.    Appearance: He is well-developed.     Comments: Patient laying in bed resting comfortably and appears to be in no acute discomfort.  HENT:     Head: Atraumatic.  Eyes:     Conjunctiva/sclera: Conjunctivae normal.  Cardiovascular:     Rate and Rhythm: Normal rate and regular rhythm.     Pulses: Normal pulses.     Heart sounds: Normal heart sounds.  Pulmonary:     Effort: Pulmonary effort is normal.     Breath sounds: Normal breath sounds.  Abdominal:     Palpations: Abdomen is soft.     Tenderness: There is no abdominal tenderness.  Musculoskeletal:      Cervical back: Neck supple.  Skin:    Findings: No rash.  Neurological:     Mental Status: He is alert.     GCS: GCS eye subscore is 4. GCS verbal subscore is 5. GCS motor subscore is 6.  Psychiatric:        Attention and Perception: Attention normal.        Mood and Affect: Mood normal.        Speech: Speech normal.        Behavior: Behavior is cooperative.        Thought Content: Thought content does not include homicidal or suicidal ideation.  ED Results / Procedures / Treatments   Labs (all labs ordered are listed, but only abnormal results are displayed) Labs Reviewed  COMPREHENSIVE METABOLIC PANEL - Abnormal; Notable for the following components:      Result Value   Glucose, Bld 269 (*)    Creatinine, Ser 1.45 (*)    Total Bilirubin 1.4 (*)    GFR, Estimated 56 (*)    All other components within normal limits  CBG MONITORING, ED - Abnormal; Notable for the following components:   Glucose-Capillary 381 (*)    All other components within normal limits  RESP PANEL BY RT-PCR (FLU A&B, COVID) ARPGX2  ETHANOL  CBC WITH DIFFERENTIAL/PLATELET  RAPID URINE DRUG SCREEN, HOSP PERFORMED  HEMOGLOBIN A1C    EKG None ED ECG REPORT   Date: 01/26/2021  Rate: 99  Rhythm: normal sinus rhythm  QRS Axis: normal  Intervals: normal  ST/T Wave abnormalities: nonspecific ST changes  Conduction Disutrbances:none  Narrative Interpretation:   Old EKG Reviewed: unchanged  I have personally reviewed the EKG tracing and agree with the computerized printout as noted.   Radiology No results found.  Procedures Procedures   Medications Ordered in ED Medications  amLODipine (NORVASC) tablet 10 mg (10 mg Oral Given 01/26/21 2047)  benztropine (COGENTIN) tablet 1 mg (1 mg Oral Given 01/26/21 2233)  aspirin EC tablet 81 mg (81 mg Oral Given 01/26/21 2048)  cloNIDine (CATAPRES) tablet 0.2 mg (0.2 mg Oral Given 01/26/21 2232)  glipiZIDE (GLUCOTROL XL) 24 hr tablet 10 mg (has no  administration in time range)  haloperidol (HALDOL) tablet 20 mg (20 mg Oral Given 01/26/21 2233)  liraglutide (VICTOZA) SOPN 1.8 mg (has no administration in time range)  lisinopril (ZESTRIL) tablet 40 mg (40 mg Oral Given 01/26/21 2048)  lithium carbonate (ESKALITH) CR tablet 450 mg (450 mg Oral Given 01/26/21 2232)  insulin aspart (novoLOG) injection 0-15 Units (has no administration in time range)  insulin aspart (novoLOG) injection 0-5 Units (5 Units Subcutaneous Given 01/26/21 2236)  insulin aspart (novoLOG) injection 6 Units (has no administration in time range)    ED Course  I have reviewed the triage vital signs and the nursing notes.  Pertinent labs & imaging results that were available during my care of the patient were reviewed by me and considered in my medical decision making (see chart for details).    MDM Rules/Calculators/A&P                           BP (!) 175/106   Pulse 100   Temp 98.5 F (36.9 C)   Resp 16   SpO2 98%   Final Clinical Impression(s) / ED Diagnoses Final diagnoses:  Psychosis, unspecified psychosis type (HCC)    Rx / DC Orders ED Discharge Orders     None      5:36 PM Patient sent here from [facility] under IVC for auditory hallucination and delusional thoughts.  At the moment patient denies SI or HI or AVH. Patient was reportedly responding to external stimuli in the triage.  First exam performed.  11:08 PM Patient is medically clear and will receive further management by TTS and psychiatry.   Fayrene Helper, PA-C 01/26/21 Aaron Edelman    Benjiman Core, MD 01/27/21 [phone]

## 2021-01-26 NOTE — ED Notes (Signed)
Pt belongings in locker #7.

## 2021-01-26 NOTE — ED Notes (Signed)
Patient provided with food and drink with Provider approval. Patient denies any further needs at this time. Stretcher in low and locked position. Side rails up x2. Sitter remains at bedside. Patient verbalizes understanding.

## 2021-01-26 NOTE — Discharge Instructions (Signed)
Transfer to [facility] ED.

## 2021-01-27 ENCOUNTER — Telehealth (HOSPITAL_COMMUNITY): Payer: Self-pay

## 2021-01-27 LAB — CBG MONITORING, ED
Glucose-Capillary: 400 mg/dL — ABNORMAL HIGH (ref 70–99)
Glucose-Capillary: 308 mg/dL — ABNORMAL HIGH (ref 70–99)
Glucose-Capillary: 113 mg/dL — ABNORMAL HIGH (ref 70–99)
Glucose-Capillary: 306 mg/dL — ABNORMAL HIGH (ref 70–99)
Glucose-Capillary: 191 mg/dL — ABNORMAL HIGH (ref 70–99)

## 2021-01-27 LAB — HEMOGLOBIN A1C
Hgb A1c MFr Bld: 11.1 % — ABNORMAL HIGH (ref 4.8–5.6)
Mean Plasma Glucose: 271.87 mg/dL

## 2021-01-27 LAB — LITHIUM LEVEL: Lithium Lvl: 0.08 mmol/L — ABNORMAL LOW (ref 0.60–1.20)

## 2021-01-27 MED ORDER — LORAZEPAM 1 MG PO TABS
1.0000 mg | ORAL_TABLET | Freq: Once | ORAL | Status: AC
  Administered 2021-01-27: 1 mg via ORAL
  Filled 2021-01-27: qty 1

## 2021-01-27 MED ORDER — LORAZEPAM 1 MG PO TABS
1.0000 mg | ORAL_TABLET | Freq: Four times a day (QID) | ORAL | Status: DC | PRN
  Administered 2021-01-27 – 2021-01-28 (×3): 1 mg via ORAL
  Filled 2021-01-27 (×3): qty 1

## 2021-01-27 NOTE — ED Notes (Signed)
Sitter at door

## 2021-01-27 NOTE — ED Notes (Signed)
Patient roaming around in another pod within the department. Instructed pt he needs to stay by his bed. The pt responded, "I'm ready to go. I am about to just leave." Informed the patient that he needs to stay at his bed and if he leaves the police and security will escort him back to his bed. Patient returned to his bed.

## 2021-01-27 NOTE — ED Provider Notes (Signed)
Emergency Medicine Observation Re-evaluation Note  Mitchell [name] is a 60 y.o. male, seen on rounds today.  Pt initially presented to the ED for complaints of No chief complaint on file. Currently, the patient is under IVC.  Physical Exam  BP (!) 138/92   Pulse 97   Temp 98.5 F (36.9 C)   Resp 16   SpO2 99%  Physical Exam General: Sleeping and easily arousable Cardiac: Extremities well perfused Lungs: Breathing is even and unlabored Psych: When awakened, calm, not responding to internal stimuli  ED Course / MDM  EKG:EKG Interpretation  Date/Time:  Monday January 26 2021 18:16:41 EDT Ventricular Rate:  99 PR Interval:  144 QRS Duration: 80 QT Interval:  366 QTC Calculation: 469 R Axis:   16 Text Interpretation: Normal sinus rhythm Cannot rule out Anterior infarct , age undetermined Abnormal ECG Confirmed by Zadie Rhine ([id number]) on 01/27/2021 2:09:34 AM  I have reviewed the labs performed to date as well as medications administered while in observation.  Recent changes in the last 24 hours include patient seen by TTS.  Recommendations pending.  Plan  Current plan is for TTS recommendations. [name] is under involuntary commitment.      Gloris Manchester, MD 01/27/21 [phone]

## 2021-01-27 NOTE — ED Notes (Signed)
Patient demanding clothing so he can "go home please" at this time.

## 2021-01-27 NOTE — ED Notes (Addendum)
Patient attempting to "go home". Patient frustrated but redirectable back to stretcher. Patient continuing to engage with internal stimuli Provider made aware. Orders to follow

## 2021-01-27 NOTE — Progress Notes (Signed)
Pt is under Review at [facility] for Inpatient Behavioral Health Placement.  Pt is currently under review at [facility] by Fransico Michael, RN who is monitoring pt's blood sugars throughout the night. CSW and SW disposition will continue to assist and secure inpatient behavioral health placement for pt.  Maryjean Ka, MSW, [facility] 01/27/2021 10:49 PM
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
## 2021-01-27 NOTE — BH Assessment (Signed)
Care Management - Follow Up [facility] Discharges   Per chart review, Due to patient's recent hospitalization due to high blood glucose, his last charted result was 316 on 01/20/2021 and him refusing to take his insulin.  He will be transferred to [facility] emergency department accepting physician is Dr. Lockie Mola. Once patient is medically cleared patient will be reevaluated by psychiatry.

## 2021-01-28 ENCOUNTER — Other Ambulatory Visit: Payer: Self-pay

## 2021-01-28 ENCOUNTER — Inpatient Hospital Stay (HOSPITAL_COMMUNITY)
Admission: AD | Admit: 2021-01-28 | Discharge: 2021-03-02 | DRG: 885 | Disposition: A | Discharge status: Home / Self Care | Payer: Medicare Other | Attending: Psychiatry | Admitting: Psychiatry

## 2021-01-28 ENCOUNTER — Encounter (HOSPITAL_COMMUNITY): Payer: Self-pay | Admitting: Psychiatry

## 2021-01-28 DIAGNOSIS — J302 Other seasonal allergic rhinitis: Secondary | ICD-10-CM | POA: Diagnosis present

## 2021-01-28 DIAGNOSIS — I10 Essential (primary) hypertension: Secondary | ICD-10-CM | POA: Diagnosis present

## 2021-01-28 DIAGNOSIS — E669 Obesity, unspecified: Secondary | ICD-10-CM | POA: Diagnosis present

## 2021-01-28 DIAGNOSIS — F209 Schizophrenia, unspecified: Principal | ICD-10-CM | POA: Diagnosis present

## 2021-01-28 DIAGNOSIS — Z7984 Long term (current) use of oral hypoglycemic drugs: Secondary | ICD-10-CM | POA: Diagnosis not present

## 2021-01-28 DIAGNOSIS — I69398 Other sequelae of cerebral infarction: Secondary | ICD-10-CM | POA: Diagnosis not present

## 2021-01-28 DIAGNOSIS — T43595A Adverse effect of other antipsychotics and neuroleptics, initial encounter: Secondary | ICD-10-CM | POA: Diagnosis present

## 2021-01-28 DIAGNOSIS — R451 Restlessness and agitation: Secondary | ICD-10-CM | POA: Diagnosis not present

## 2021-01-28 DIAGNOSIS — I7090 Unspecified atherosclerosis: Secondary | ICD-10-CM | POA: Diagnosis present

## 2021-01-28 DIAGNOSIS — F419 Anxiety disorder, unspecified: Secondary | ICD-10-CM | POA: Diagnosis present

## 2021-01-28 DIAGNOSIS — E119 Type 2 diabetes mellitus without complications: Secondary | ICD-10-CM | POA: Diagnosis present

## 2021-01-28 DIAGNOSIS — R140 Abdominal distension (gaseous): Secondary | ICD-10-CM | POA: Diagnosis not present

## 2021-01-28 DIAGNOSIS — G4730 Sleep apnea, unspecified: Secondary | ICD-10-CM | POA: Diagnosis present

## 2021-01-28 DIAGNOSIS — R7401 Elevation of levels of liver transaminase levels: Secondary | ICD-10-CM | POA: Diagnosis not present

## 2021-01-28 DIAGNOSIS — H547 Unspecified visual loss: Secondary | ICD-10-CM | POA: Diagnosis present

## 2021-01-28 DIAGNOSIS — Z79899 Other long term (current) drug therapy: Secondary | ICD-10-CM

## 2021-01-28 DIAGNOSIS — I951 Orthostatic hypotension: Secondary | ICD-10-CM | POA: Diagnosis not present

## 2021-01-28 DIAGNOSIS — Z20822 Contact with and (suspected) exposure to covid-19: Secondary | ICD-10-CM | POA: Diagnosis present

## 2021-01-28 DIAGNOSIS — F23 Brief psychotic disorder: Secondary | ICD-10-CM | POA: Diagnosis not present

## 2021-01-28 DIAGNOSIS — F259 Schizoaffective disorder, unspecified: Secondary | ICD-10-CM | POA: Diagnosis present

## 2021-01-28 DIAGNOSIS — F250 Schizoaffective disorder, bipolar type: Secondary | ICD-10-CM | POA: Diagnosis present

## 2021-01-28 DIAGNOSIS — Z6835 Body mass index (BMI) 35.0-35.9, adult: Secondary | ICD-10-CM

## 2021-01-28 DIAGNOSIS — Z794 Long term (current) use of insulin: Secondary | ICD-10-CM | POA: Diagnosis not present

## 2021-01-28 DIAGNOSIS — K5900 Constipation, unspecified: Secondary | ICD-10-CM | POA: Diagnosis present

## 2021-01-28 DIAGNOSIS — R45851 Suicidal ideations: Secondary | ICD-10-CM | POA: Diagnosis present

## 2021-01-28 DIAGNOSIS — R188 Other ascites: Secondary | ICD-10-CM | POA: Diagnosis present

## 2021-01-28 DIAGNOSIS — R000 Tachycardia, unspecified: Secondary | ICD-10-CM | POA: Diagnosis not present

## 2021-01-28 DIAGNOSIS — F200 Paranoid schizophrenia: Secondary | ICD-10-CM | POA: Diagnosis not present

## 2021-01-28 DIAGNOSIS — Z7982 Long term (current) use of aspirin: Secondary | ICD-10-CM

## 2021-01-28 LAB — GLUCOSE, CAPILLARY
Glucose-Capillary: 92 mg/dL (ref 70–99)
Glucose-Capillary: 266 mg/dL — ABNORMAL HIGH (ref 70–99)

## 2021-01-28 LAB — CBG MONITORING, ED
Glucose-Capillary: 246 mg/dL — ABNORMAL HIGH (ref 70–99)
Glucose-Capillary: 281 mg/dL — ABNORMAL HIGH (ref 70–99)

## 2021-01-28 LAB — POC SARS CORONAVIRUS 2 AG -  ED: SARSCOV2ONAVIRUS 2 AG: NEGATIVE

## 2021-01-28 MED ORDER — ZIPRASIDONE MESYLATE 20 MG IM SOLR: 20.0000 mg | INTRAMUSCULAR | Status: DC | PRN

## 2021-01-28 MED ORDER — AMLODIPINE BESYLATE 10 MG PO TABS
10.0000 mg | ORAL_TABLET | Freq: Every day | ORAL | Status: DC
  Administered 2021-01-29 – 2021-03-02 (×33): 10 mg via ORAL
  Filled 2021-01-28 (×6): qty 1
  Filled 2021-01-28: qty 2
  Filled 2021-01-28 (×29): qty 1

## 2021-01-28 MED ORDER — INSULIN ASPART 100 UNIT/ML IJ SOLN
0.0000 [IU] | Freq: Every day | INTRAMUSCULAR | Status: DC
Start: 2021-01-28 — End: 2021-03-02
  Administered 2021-01-28: 3 [IU] via SUBCUTANEOUS
  Administered 2021-01-30: 5 [IU] via SUBCUTANEOUS
  Administered 2021-01-31: 2 [IU] via SUBCUTANEOUS
  Administered 2021-02-01: 5 [IU] via SUBCUTANEOUS
  Administered 2021-02-02: 3 [IU] via SUBCUTANEOUS
  Administered 2021-02-03: 4 [IU] via SUBCUTANEOUS
  Administered 2021-02-04: 5 [IU] via SUBCUTANEOUS
  Administered 2021-02-05: 4 [IU] via SUBCUTANEOUS
  Administered 2021-02-06 – 2021-02-07 (×2): 5 [IU] via SUBCUTANEOUS
  Administered 2021-02-08: 4 [IU] via SUBCUTANEOUS
  Administered 2021-02-09 – 2021-02-11 (×2): 3 [IU] via SUBCUTANEOUS
  Administered 2021-02-12: 21:00:00 4 [IU] via SUBCUTANEOUS
  Administered 2021-02-13: 5 [IU] via SUBCUTANEOUS
  Administered 2021-02-15: 3 [IU] via SUBCUTANEOUS
  Administered 2021-02-16: 2 [IU] via SUBCUTANEOUS
  Administered 2021-02-17 – 2021-02-20 (×2): 3 [IU] via SUBCUTANEOUS
  Administered 2021-02-22: 22:00:00 2 [IU] via SUBCUTANEOUS
  Administered 2021-02-24: 3 [IU] via SUBCUTANEOUS
  Administered 2021-02-25: 4 [IU] via SUBCUTANEOUS
  Administered 2021-02-26: 3 [IU] via SUBCUTANEOUS
  Administered 2021-02-27: 2 [IU] via SUBCUTANEOUS
  Administered 2021-02-28: 3 [IU] via SUBCUTANEOUS
  Administered 2021-03-01: 21:00:00 4 [IU] via SUBCUTANEOUS

## 2021-01-28 MED ORDER — GLIPIZIDE ER 10 MG PO TB24
10.0000 mg | ORAL_TABLET | Freq: Every day | ORAL | Status: DC
  Administered 2021-01-29 – 2021-02-24 (×27): 10 mg via ORAL
  Filled 2021-01-28 (×28): qty 1

## 2021-01-28 MED ORDER — ASPIRIN EC 81 MG PO TBEC
81.0000 mg | DELAYED_RELEASE_TABLET | Freq: Every day | ORAL | Status: DC
  Administered 2021-01-29 – 2021-03-02 (×33): 81 mg via ORAL
  Filled 2021-01-28 (×37): qty 1

## 2021-01-28 MED ORDER — LISINOPRIL 40 MG PO TABS
40.0000 mg | ORAL_TABLET | Freq: Every day | ORAL | Status: DC
  Administered 2021-01-29 – 2021-03-02 (×33): 40 mg via ORAL
  Filled 2021-01-28 (×10): qty 1
  Filled 2021-01-28: qty 2
  Filled 2021-01-28 (×20): qty 1
  Filled 2021-01-28: qty 2
  Filled 2021-01-28 (×5): qty 1

## 2021-01-28 MED ORDER — OLANZAPINE 10 MG PO TBDP
10.0000 mg | ORAL_TABLET | Freq: Three times a day (TID) | ORAL | Status: DC | PRN
  Administered 2021-01-30 – 2021-02-03 (×3): 10 mg via ORAL
  Filled 2021-01-28 (×5): qty 1

## 2021-01-28 MED ORDER — BENZTROPINE MESYLATE 1 MG PO TABS
1.0000 mg | ORAL_TABLET | Freq: Two times a day (BID) | ORAL | Status: DC
  Administered 2021-01-28 – 2021-03-02 (×66): 1 mg via ORAL
  Filled 2021-01-28 (×75): qty 1

## 2021-01-28 MED ORDER — HALOPERIDOL 5 MG PO TABS
20.0000 mg | ORAL_TABLET | Freq: Every day | ORAL | Status: DC
  Administered 2021-01-28 – 2021-02-07 (×11): 20 mg via ORAL
  Filled 2021-01-28 (×13): qty 4

## 2021-01-28 MED ORDER — LITHIUM CARBONATE ER 450 MG PO TBCR
450.0000 mg | EXTENDED_RELEASE_TABLET | Freq: Two times a day (BID) | ORAL | Status: DC
  Administered 2021-01-28 – 2021-03-02 (×66): 450 mg via ORAL
  Filled 2021-01-28 (×74): qty 1

## 2021-01-28 MED ORDER — INSULIN ASPART 100 UNIT/ML IJ SOLN
0.0000 [IU] | Freq: Three times a day (TID) | INTRAMUSCULAR | Status: DC
Start: 2021-01-28 — End: 2021-03-02
  Administered 2021-01-29: 8 [IU] via SUBCUTANEOUS
  Administered 2021-01-29: 15 [IU] via SUBCUTANEOUS
  Administered 2021-01-29: 8 [IU] via SUBCUTANEOUS
  Administered 2021-01-30: 5 [IU] via SUBCUTANEOUS
  Administered 2021-01-30: 8 [IU] via SUBCUTANEOUS
  Administered 2021-01-30: 15 [IU] via SUBCUTANEOUS
  Administered 2021-01-31: 3 [IU] via SUBCUTANEOUS
  Administered 2021-01-31: 15 [IU] via SUBCUTANEOUS
  Administered 2021-01-31: 2 [IU] via SUBCUTANEOUS
  Administered 2021-02-01: 5 [IU] via SUBCUTANEOUS
  Administered 2021-02-01: 8 [IU] via SUBCUTANEOUS
  Administered 2021-02-01: 15 [IU] via SUBCUTANEOUS
  Administered 2021-02-02: 5 [IU] via SUBCUTANEOUS
  Administered 2021-02-02: 15 [IU] via SUBCUTANEOUS
  Administered 2021-02-02 – 2021-02-03 (×2): 5 [IU] via SUBCUTANEOUS
  Administered 2021-02-03: 3 [IU] via SUBCUTANEOUS
  Administered 2021-02-03: 15 [IU] via SUBCUTANEOUS
  Administered 2021-02-04: 8 [IU] via SUBCUTANEOUS
  Administered 2021-02-04: 5 [IU] via SUBCUTANEOUS
  Administered 2021-02-04: 15 [IU] via SUBCUTANEOUS
  Administered 2021-02-05: 11 [IU] via SUBCUTANEOUS
  Administered 2021-02-05: 8 [IU] via SUBCUTANEOUS
  Administered 2021-02-05: 11 [IU] via SUBCUTANEOUS
  Administered 2021-02-06: 8 [IU] via SUBCUTANEOUS
  Administered 2021-02-06: 3 [IU] via SUBCUTANEOUS
  Administered 2021-02-06: 8 [IU] via SUBCUTANEOUS
  Administered 2021-02-07: 5 [IU] via SUBCUTANEOUS
  Administered 2021-02-07: 8 [IU] via SUBCUTANEOUS
  Administered 2021-02-07: 5 [IU] via SUBCUTANEOUS
  Administered 2021-02-08: 15 [IU] via SUBCUTANEOUS
  Administered 2021-02-08: 5 [IU] via SUBCUTANEOUS
  Administered 2021-02-08: 15 [IU] via SUBCUTANEOUS
  Administered 2021-02-09 (×2): 8 [IU] via SUBCUTANEOUS
  Administered 2021-02-09: 3 [IU] via SUBCUTANEOUS
  Administered 2021-02-10: 15 [IU] via SUBCUTANEOUS
  Administered 2021-02-10: 8 [IU] via SUBCUTANEOUS
  Administered 2021-02-10: 5 [IU] via SUBCUTANEOUS
  Administered 2021-02-11 (×2): 8 [IU] via SUBCUTANEOUS
  Administered 2021-02-11 – 2021-02-12 (×2): 5 [IU] via SUBCUTANEOUS
  Administered 2021-02-12: 12:00:00 8 [IU] via SUBCUTANEOUS
  Administered 2021-02-12: 17:00:00 10 [IU] via SUBCUTANEOUS
  Administered 2021-02-13: 3 [IU] via SUBCUTANEOUS
  Administered 2021-02-13: 8 [IU] via SUBCUTANEOUS
  Administered 2021-02-13: 15 [IU] via SUBCUTANEOUS
  Administered 2021-02-14: 8 [IU] via SUBCUTANEOUS
  Administered 2021-02-14: 2 [IU] via SUBCUTANEOUS
  Administered 2021-02-14 – 2021-02-16 (×4): 3 [IU] via SUBCUTANEOUS
  Administered 2021-02-16 (×2): 5 [IU] via SUBCUTANEOUS
  Administered 2021-02-17: 8 [IU] via SUBCUTANEOUS
  Administered 2021-02-17 – 2021-02-18 (×3): 5 [IU] via SUBCUTANEOUS
  Administered 2021-02-18: 3 [IU] via SUBCUTANEOUS
  Administered 2021-02-18 – 2021-02-19 (×2): 5 [IU] via SUBCUTANEOUS
  Administered 2021-02-19: 3 [IU] via SUBCUTANEOUS
  Administered 2021-02-19: 5 [IU] via SUBCUTANEOUS
  Administered 2021-02-20: 2 [IU] via SUBCUTANEOUS
  Administered 2021-02-20: 8 [IU] via SUBCUTANEOUS
  Administered 2021-02-21: 3 [IU] via SUBCUTANEOUS
  Administered 2021-02-21: 5 [IU] via SUBCUTANEOUS
  Administered 2021-02-21: 8 [IU] via SUBCUTANEOUS
  Administered 2021-02-22: 07:00:00 3 [IU] via SUBCUTANEOUS
  Administered 2021-02-22: 13:00:00 5 [IU] via SUBCUTANEOUS
  Administered 2021-02-22: 17:00:00 8 [IU] via SUBCUTANEOUS
  Administered 2021-02-23 (×2): 5 [IU] via SUBCUTANEOUS
  Administered 2021-02-23: 17:00:00 2 [IU] via SUBCUTANEOUS
  Administered 2021-02-24: 5 [IU] via SUBCUTANEOUS
  Administered 2021-02-24: 8 [IU] via SUBCUTANEOUS
  Administered 2021-02-24: 5 [IU] via SUBCUTANEOUS
  Administered 2021-02-25: 3 [IU] via SUBCUTANEOUS
  Administered 2021-02-25: 2 [IU] via SUBCUTANEOUS
  Administered 2021-02-25: 11 [IU] via SUBCUTANEOUS
  Administered 2021-02-26: 3 [IU] via SUBCUTANEOUS
  Administered 2021-02-26: 11 [IU] via SUBCUTANEOUS
  Administered 2021-02-26: 5 [IU] via SUBCUTANEOUS
  Administered 2021-02-27: 2 [IU] via SUBCUTANEOUS
  Administered 2021-02-27: 15 [IU] via SUBCUTANEOUS
  Administered 2021-02-27: 3 [IU] via SUBCUTANEOUS
  Administered 2021-02-28 (×2): 5 [IU] via SUBCUTANEOUS
  Administered 2021-02-28: 3 [IU] via SUBCUTANEOUS
  Administered 2021-03-01: 07:00:00 5 [IU] via SUBCUTANEOUS
  Administered 2021-03-01: 13:00:00 3 [IU] via SUBCUTANEOUS
  Administered 2021-03-01 – 2021-03-02 (×2): 8 [IU] via SUBCUTANEOUS
  Administered 2021-03-02: 3 [IU] via SUBCUTANEOUS

## 2021-01-28 MED ORDER — TRAZODONE HCL 50 MG PO TABS
50.0000 mg | ORAL_TABLET | Freq: Every evening | ORAL | Status: DC | PRN
  Administered 2021-01-28 – 2021-02-22 (×20): 50 mg via ORAL
  Filled 2021-01-28 (×21): qty 1

## 2021-01-28 MED ORDER — INSULIN ASPART 100 UNIT/ML IJ SOLN: 6.0000 [IU] | Freq: Three times a day (TID) | INTRAMUSCULAR | Status: DC

## 2021-01-28 MED ORDER — INSULIN ASPART 100 UNIT/ML IJ SOLN
6.0000 [IU] | Freq: Three times a day (TID) | INTRAMUSCULAR | Status: DC
  Administered 2021-01-28 – 2021-01-31 (×9): 6 [IU] via SUBCUTANEOUS

## 2021-01-28 MED ORDER — LORAZEPAM 1 MG PO TABS
1.0000 mg | ORAL_TABLET | ORAL | Status: AC | PRN
  Administered 2021-02-03: 1 mg via ORAL
  Filled 2021-01-28: qty 1

## 2021-01-28 MED ORDER — CLONIDINE HCL 0.2 MG PO TABS
0.2000 mg | ORAL_TABLET | Freq: Two times a day (BID) | ORAL | Status: DC
  Administered 2021-01-28 – 2021-02-11 (×28): 0.2 mg via ORAL
  Filled 2021-01-28 (×27): qty 1
  Filled 2021-01-28 (×2): qty 2
  Filled 2021-01-28 (×5): qty 1

## 2021-01-28 MED ORDER — INSULIN ASPART 100 UNIT/ML IJ SOLN
0.0000 [IU] | Freq: Three times a day (TID) | INTRAMUSCULAR | Status: DC
Start: 2021-01-29 — End: 2021-01-28

## 2021-01-28 MED ORDER — ACETAMINOPHEN 325 MG PO TABS: 650.0000 mg | ORAL_TABLET | Freq: Four times a day (QID) | ORAL | Status: DC | PRN

## 2021-01-28 MED ORDER — MAGNESIUM HYDROXIDE 400 MG/5ML PO SUSP: 30.0000 mL | Freq: Every day | ORAL | Status: DC | PRN

## 2021-01-28 MED ORDER — ALUM & MAG HYDROXIDE-SIMETH 200-200-20 MG/5ML PO SUSP: 30.0000 mL | ORAL | Status: DC | PRN

## 2021-01-28 MED ORDER — OLANZAPINE 5 MG PO TBDP
10.0000 mg | ORAL_TABLET | Freq: Three times a day (TID) | ORAL | Status: DC | PRN
  Administered 2021-01-28: 10 mg via ORAL
  Filled 2021-01-28: qty 2

## 2021-01-28 NOTE — ED Notes (Signed)
This RN rounded on pt, pt standing in room watching tv. Appears in NAD, denies complaints at this time.

## 2021-01-28 NOTE — Progress Notes (Signed)
Mitchell [name] was in the day room all evening.  He attended evening wrap up group.  He denied any pain or discomfort and appears to be in no physical distress.  He denied SI/HI or AVH but appears to be responding to internal stimuli.  He was difficult to understand at times due to slurred/mumbling speech.  His blood sugar at bedtime was 266 requiring 3 units of insulin.  He took his bedtime medications and requested trazodone for sleep.  He is currently resting with his eyes closed and appears to be asleep.  Q 15 minute checks maintained for safety.   01/28/21 2045  Psych Admission Type (Psych Patients Only)  Admission Status Involuntary  Psychosocial Assessment  Patient Complaints Suspiciousness;Insomnia  Eye Contact Brief  Facial Expression Anxious;Worried  Affect Anxious;Preoccupied  Research scientist (physical sciences) Activity Slow  Appearance/Hygiene Education officer, community;In scrubs  Behavior Characteristics Cooperative;Appropriate to situation  Mood Pleasant;Suspicious;Anxious  Thought Process  Coherency Concrete thinking;Disorganized  Content Confabulation  Delusions None reported or observed  Perception Hallucinations  Hallucination Auditory  Judgment Poor  Confusion Mild  Danger to Self  Current suicidal ideation? Denies  Danger to Others  Danger to Others None reported or observed

## 2021-01-28 NOTE — ED Provider Notes (Signed)
Emergency Medicine Observation Re-evaluation Note  Mitchell [name] is a 60 y.o. male, seen on rounds today.  Pt initially presented to the ED for complaints of No chief complaint on file. Currently, the patient is awaiting inpatient placement.  Physical Exam  BP (!) 153/93 (BP Location: Right Arm)   Pulse 73   Temp 97.7 F (36.5 C) (Oral)   Resp 17   SpO2 100%  Physical Exam General: resting in bed, no acute distress Cardiac: not assessed Lungs: normal WOB Psych: no agitation  ED Course / MDM  EKG:EKG Interpretation  Date/Time:  Monday January 26 2021 18:16:41 EDT Ventricular Rate:  99 PR Interval:  144 QRS Duration: 80 QT Interval:  366 QTC Calculation: 469 R Axis:   16 Text Interpretation: Normal sinus rhythm Cannot rule out Anterior infarct , age undetermined Abnormal ECG Confirmed by Zadie Rhine ([id number]) on 01/27/2021 2:09:34 AM  I have reviewed the labs performed to date as well as medications administered while in observation.  No recent changes in the last 24 hours.  Plan  Current plan is for inpatient placement. Mitchell [name] is under involuntary commitment.      Pricilla Loveless, MD 01/28/21 [phone]

## 2021-01-28 NOTE — Progress Notes (Signed)
Pt didn't attend psycho-ed group.  

## 2021-01-28 NOTE — BHH Group Notes (Signed)
Adult Psychoeducational Group Note  Date:  01/28/2021 Time:  10:49 PM  Group Topic/Focus:  Crisis Planning:   The purpose of this group is to help patients create a crisis plan for use upon discharge or in the future, as needed.  Participation Level:  Minimal  Participation Quality:  Attentive  Affect:  Appropriate  Cognitive:  Alert  Insight: Good  Engagement in Group:  Engaged  Modes of Intervention:  Discussion  Additional Comments  Jacalyn Lefevre 01/28/2021, 10:49 PM

## 2021-01-28 NOTE — Progress Notes (Signed)
Inpatient Diabetes Program Recommendations  AACE/ADA: New Consensus Statement on Inpatient Glycemic Control (2015)  Target Ranges:  Prepandial:   less than 140 mg/dL      Peak postprandial:   less than 180 mg/dL (1-2 hours)      Critically ill patients:  140 - 180 mg/dL   Lab Results  Component Value Date   GLUCAP 246 (H) 01/28/2021   HGBA1C 11.1 (H) 01/26/2021    Review of Glycemic Control Results for Mitchell [name], Mitchell [name] (MRN [id number]) as of 01/28/2021 11:09  Ref. Range 01/27/2021 09:15 01/27/2021 12:54 01/27/2021 18:28 01/27/2021 21:39 01/28/2021 07:46  Glucose-Capillary Latest Ref Range: 70 - 99 mg/dL 518 (H) 841 (H) 660 (H) 191 (H) 246 (H)   Diabetes history: DM 2 Outpatient Diabetes medications: Glipizide 10 mg Daily, Lantus 10 units Daily, Humalog 2-10 units tid (not taking), Victoza in the past Current orders for Inpatient glycemic control:  Glipizide 10 mg Daily Novolog 0-15 units tid + hs Novolog 6 units tid meal coverage   Inpatient Diabetes Program Recommendations:    -  Add Semglee 10 units  Thanks,  Christena Deem RN, MSN, BC-ADM Inpatient Diabetes Coordinator Team Pager [phone] (8a-5p)

## 2021-01-28 NOTE — ED Notes (Signed)
Pt continues to stand in room at this time, watching TV. Told this RN that it was too early to go to sleep. This RN reoriented pt to time of day. Pt verbalized understanding, stating he was fine.

## 2021-01-28 NOTE — ED Notes (Signed)
Breakfast Orders placed 

## 2021-01-28 NOTE — ED Provider Notes (Signed)
Transferred to [facility]   Pricilla Loveless, MD 01/28/21 1551

## 2021-01-28 NOTE — ED Notes (Signed)
Pt resting at this time. Visible chest rise and fall noted. Pt appears in NAD.  

## 2021-01-28 NOTE — Progress Notes (Signed)
Pt accepted to [facility] 507-1     Patient meets inpatient criteria per Nira Conn, FNP   The attending provider will be Haze Rushing, MD   Call report to [phone]    Celine Ahr, RN @ [facility] ED notified.     Pt scheduled  to arrive at [facility] Today.    Damita Dunnings, MSW, LCSW-A  3:20 PM 01/28/2021

## 2021-01-28 NOTE — Progress Notes (Signed)
Pt didn't attend therapeutic relaxation group. 

## 2021-01-28 NOTE — Progress Notes (Signed)
Patient ID: Mitchell [name], male   DOB: 08/06/1960, 60 y.o.   MRN: [id number] Admission Note  Pt is a 60 yo male that presents IVC'd on 01/28/2021 with worsening delusions, suicidal statements, and possible medication noncompliance. When asked what brought them to the hospital, the pt stated they were the police chief and the officers that dropped them off made a mistake. Pt stated they were Patti LaBelle's son and we could contact them to clear this up. Pt states they live alone. Pt endorses medication compliance. Pt denies drug/tobacco/alcohol use/abuse. Pt is difficult to understand at times and is groggy on assessment. Pt states this is from the medication at the ED. Pt denies any physical complaints. Pt's skin assessment was unremarkable. Pt denies current si/hi/ah/vh and verbally agrees to approach staff before harming self/others while at bhh. Consents signed, handbook detailing the patient's rights, responsibilities, and visitor guidelines provided. Skin/belongings search completed and patient oriented to unit. Patient stable at this time. Patient given the opportunity to express concerns and ask questions. Patient given toiletries. Will continue to monitor.   [facility] 11/1:  Pt has said he was going to work.  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  Pt says he takes HPN, diabetes meds and haldol.  He says he has been taking these meds as directed (doubtful).Pt says he was not trying to jump into a trash compactor.  He says that as a Holiday representative" he is in control of everything in the world.  He says that he lives by himself.  Pt paces back and forth and will say what he thinks will get him to leave and go home. The petitioner is Catering manager of [employer] and patient receives Best boy Team from that compay.  Pt has dx of schizophrenia and has been experiencing  auditory hallucinations and delusional thoughts.  Patient has not been taking his psychiatric meds nor his insulin.  Pt's apartment manager has said that patient has threatened to kill himself by jumping into the trash compactor.

## 2021-01-28 NOTE — ED Notes (Signed)
Pt agitated pulling emergency code leaver in his room. Pt denied when asked if he pull leaver. Pt repeated action several times. PRN given

## 2021-01-28 NOTE — Tx Team (Cosign Needed)
Initial Treatment Plan 01/28/2021 4:48 PM [name] HDI:[id number]    PATIENT STRESSORS: Health problems   Medication change or noncompliance     PATIENT STRENGTHS: Capable of independent living  Supportive family/friends    PATIENT IDENTIFIED PROBLEMS: delusions  paranoia  SI                 DISCHARGE CRITERIA:  Ability to meet basic life and health needs Improved stabilization in mood, thinking, and/or behavior Motivation to continue treatment in a less acute level of care Need for constant or close observation no longer present  PRELIMINARY DISCHARGE PLAN: Attend aftercare/continuing care group Outpatient therapy Return to previous living arrangement Return to previous work or school arrangements  PATIENT/FAMILY INVOLVEMENT: This treatment plan has been presented to and reviewed with the patient, WAYLYN TENBRINK.  The patient and family have been given the opportunity to ask questions and make suggestions.  Raylene Miyamoto, RN 01/28/2021, 4:48 PM

## 2021-01-29 DIAGNOSIS — F209 Schizophrenia, unspecified: Secondary | ICD-10-CM

## 2021-01-29 LAB — GLUCOSE, CAPILLARY
Glucose-Capillary: 269 mg/dL — ABNORMAL HIGH (ref 70–99)
Glucose-Capillary: 383 mg/dL — ABNORMAL HIGH (ref 70–99)
Glucose-Capillary: 294 mg/dL — ABNORMAL HIGH (ref 70–99)
Glucose-Capillary: 406 mg/dL — ABNORMAL HIGH (ref 70–99)

## 2021-01-29 LAB — LITHIUM LEVEL: Lithium Lvl: 0.7 mmol/L (ref 0.60–1.20)

## 2021-01-29 MED ORDER — INSULIN ASPART 100 UNIT/ML IJ SOLN
7.0000 [IU] | Freq: Once | INTRAMUSCULAR | Status: AC
  Administered 2021-01-29: 7 [IU] via SUBCUTANEOUS

## 2021-01-29 NOTE — BHH Suicide Risk Assessment (Signed)
B[facility] INPATIENT:  Family/Significant Other Suicide Prevention Education  Suicide Prevention Education:  Patient Refusal for Family/Significant Other Suicide Prevention Education: The patient Mitchell [name] has refused to provide written consent for family/significant other to be provided Family/Significant Other Suicide Prevention Education during admission and/or prior to discharge.  Physician notified.  Xanthe Couillard A Soren Pigman 01/29/2021, 10:59 AM

## 2021-01-29 NOTE — BHH Counselor (Signed)
Events affecting the discharge plan:  - Awaiting MD approval  Interventions by CSW:  - CSW met with patient and completed psychosocial assessment  - CSW completed safety planning with pt as he declined consents Emotional response of the patient/family to the plan of care: - Pt plans to return home at discharge. Pt denies having services, however per chart review pt is established with [facility] for PSR and CST services.       MSW, LCSW Clincal Social Worker  [facility]  

## 2021-01-29 NOTE — Progress Notes (Signed)
Inpatient Diabetes Program Recommendations  AACE/ADA: New Consensus Statement on Inpatient Glycemic Control (2015)  Target Ranges:  Prepandial:   less than 140 mg/dL      Peak postprandial:   less than 180 mg/dL (1-2 hours)      Critically ill patients:  140 - 180 mg/dL   Lab Results  Component Value Date   GLUCAP 269 (H) 01/29/2021   HGBA1C 11.1 (H) 01/26/2021    Review of Glycemic Control Results for JERIKO, KOWALKE (MRN [id number]) as of 01/29/2021 09:11  Ref. Range 01/28/2021 07:46 01/28/2021 12:34 01/28/2021 16:56 01/28/2021 22:35 01/29/2021 06:23  Glucose-Capillary Latest Ref Range: 70 - 99 mg/dL 297 (H) 989 (H) 92 211 (H) 269 (H)   Diabetes history: Type 2 DM Outpatient Diabetes medications: Lantus 10 units QD, Humalog 2-10 units TID (NT), Glipizide 10 mg QA Current orders for Inpatient glycemic control: Glipizide 10 mg QD, Novolog 0-15 units TID & HS, Novolog 6 units TID  Inpatient Diabetes Program Recommendations:    Consider adding Semglee 10 units QD.  Thanks, Lujean Rave, MSN, RNC-OB Diabetes Coordinator [phone] (8a-5p)

## 2021-01-29 NOTE — Progress Notes (Signed)
Recreation Therapy Notes  INPATIENT RECREATION THERAPY ASSESSMENT  Patient Details Name: DIONE MCCOMBIE MRN: [id number] DOB: 12-20-1960 Today's Date: 01/29/2021       Information Obtained From: Patient  Able to Participate in Assessment/Interview: Yes  Patient Presentation: Alert (Hard to understand)  Reason for Admission (Per Patient): Patient Unable to Identify  Patient Stressors: Other (Comment) ("No")  Coping Skills:   Sports, TV, Music, Exercise, Deep Breathing, Meditate, Substance Abuse, Art, Prayer, Hot Bath/Shower  Leisure Interests (2+):  Music - Singing  Frequency of Recreation/Participation: Other (Comment) ("All my life")  Awareness of Community Resources:  Yes  Community Resources:  Restaurants, Park  Current Use: Yes  If no, Barriers?:    Expressed Interest in [employer] Information: No  [place] Residence[place]  Patient Main Form of Transportation: Taxi  Patient Strengths:  Retail buyer, Warehouse manager, Banker, Singing  Patient Identified Areas of Improvement:  "Nope"  Patient Goal for Hospitalization:  "get back home and go to work"  Current SI (including self-harm):  No  Current HI:  No  Current AVH: No  Staff Intervention Plan: Group Attendance, Collaborate with Interdisciplinary Treatment Team  Consent to Intern Participation: N/A    Caroll Rancher, LRT/CTRS Caroll Rancher A 01/29/2021, 12:47 PM

## 2021-01-29 NOTE — BHH Suicide Risk Assessment (Signed)
[facility] Admission Suicide Risk Assessment   Nursing information obtained from:  Patient Demographic factors:  Living alone, Unemployed, Low socioeconomic status Current Mental Status:  NA Loss Factors:  Decline in physical health Historical Factors:  Impulsivity Risk Reduction Factors:  Sense of responsibility to family, Positive social support, Positive therapeutic relationship  Total Time spent with patient: 30 minutes Principal Problem: <principal problem not specified> Diagnosis:  Active Problems:   Schizophrenia (HCC)  Subjective Data:   Patient is a 60 year old male with a past psychiatric history of schizophrenia, who was admitted to the psychiatric unit for evaluations of bizarre behavior, delusional thoughts, auditory hallucinations.  According to medical record, he is trying to get into a trash compactor, running naked outside.  There is also reports the patient having suicidal thoughts, reason for this hospital admission.  Patient case was discussed in interdisciplinary team this morning.  Upon initial evaluation today, the patient is psychotic.  He is delusional, disorganized, tangential.  He does deny having auditory and visual hallucinations.  He does appear to be responding to internal stimuli.  His speech is mostly irrelevant, illogical, and disorganized. He does deny having suicidal or homicidal thoughts on the interview today.   He does report that he was taking his medication leading up to the hospitalization, but does not know what his medications are.   He denies having any other medical problems, however the medical record contradicts this.   Continued Clinical Symptoms:  Alcohol Use Disorder Identification Test Final Score (AUDIT): 0 The "Alcohol Use Disorders Identification Test", Guidelines for Use in Primary Care, Second Edition.  World Science writer [facility]). Score between 0-7:  no or low risk or alcohol related problems. Score between 8-15:  moderate risk of  alcohol related problems. Score between 16-19:  high risk of alcohol related problems. Score 20 or above:  warrants further diagnostic evaluation for alcohol dependence and treatment.   CLINICAL FACTORS:   Schizophrenia:   Delusional and disorganized. Currently Psychotic   Musculoskeletal: Strength & Muscle Tone: within normal limits Gait & Station: normal Patient leans: N/A  Psychiatric Specialty Exam:  Presentation  General Appearance: Bizarre; Disheveled  Eye Contact:Fleeting  Speech:Garbled; Slurred  Speech Volume:Normal  Handedness:Right   Mood and Affect  Mood:Irritable  Affect:Constricted   Thought Process  Thought Processes:Disorganized; Irrevelant  Descriptions of Associations:Tangential  Orientation:Full (Time, Place and Person)  Thought Content:Tangential  History of Schizophrenia/Schizoaffective disorder:Yes  Duration of Psychotic Symptoms:Greater than six months  Hallucinations:Hallucinations: -- (patient denied but appears to be responding to internal stimuli)  Ideas of Reference:Delusions  Suicidal Thoughts:Suicidal Thoughts: No  Homicidal Thoughts:Homicidal Thoughts: No   Sensorium  Memory:Immediate Poor; Recent Poor; Remote Poor  Judgment:Poor  Insight:Poor   Executive Functions  Concentration:Poor  Attention Span:Poor  Recall:Poor  Fund of Knowledge:Poor  Language:Good   Psychomotor Activity  Psychomotor Activity:Psychomotor Activity: Normal   Assets  Assets:Financial Resources/Insurance; Physical Health; Resilience; Social Support   Sleep  Sleep:Sleep: Fair    Physical Exam: Physical Exam ROS Blood pressure 136/87, pulse (!) 117, temperature 97.6 F (36.4 C), temperature source Oral, resp. rate 18, height 5' 3.5" (1.613 m), weight 91.6 kg, SpO2 100 %. Body mass index is 35.22 kg/m.   COGNITIVE FEATURES THAT CONTRIBUTE TO RISK:  None    SUICIDE RISK:   Mild:  Suicidal ideation of limited frequency,  intensity, duration, and specificity.  There are no identifiable plans, no associated intent, mild dysphoria and related symptoms, (both objective and subjective assessment), few other risk factors, and  identifiable protective factors, including available and accessible social support.  PLAN OF CARE:   ASSESSMENT:  Diagnoses / Active Problems:  Schizophrenia  PLAN: Safety and Monitoring:  -- Involuntary admission to inpatient psychiatric unit for safety, stabilization and treatment  -- Daily contact with patient to assess and evaluate symptoms and progress in treatment  -- Patient's case to be discussed in multi-disciplinary team meeting  -- Observation Level : q15 minute checks  -- Vital signs:  q12 hours  -- Precautions: suicide, elopement, and assault  2. Psychiatric Diagnoses and Treatment:    Continue Haldol 20 mg nightly for now.  We will see how patient responds to this.  Is unclear if the patient was or was not taking his medication previously.  If continuing this medication, does not treat patient's psychotic symptoms, we will adjust the dose or transition to other antipsychotic.  Continue lithium 450 mg twice daily for now.  He is unclear if the patient was taking this leading up to hospitalization or not.  Labs are unclear, has lithium levels have fluctuated significant amount recently.  It is also unclear if patient was taking antihypertensive, leading up to hospitalization.  We will get another repeat lithium level tomorrow morning and BMP.  Continue other psychiatric medications including medication for hypertension and diabetes  --  The risks/benefits/side-effects/alternatives to this medication were discussed in detail with the patient and time was given for questions. The patient consents to medication trial.    -- Metabolic profile and EKG monitoring obtained while on an atypical antipsychotic (BMI: Lipid Panel: HbgA1c: QTc:450)   -- Encouraged patient to participate  in unit milieu and in scheduled group therapies   -- Short Term Goals: Ability to identify changes in lifestyle to reduce recurrence of condition will improve, Ability to verbalize feelings will improve, Ability to disclose and discuss suicidal ideas, Ability to demonstrate self-control will improve, Ability to identify and develop effective coping behaviors will improve, Ability to maintain clinical measurements within normal limits will improve, Compliance with prescribed medications will improve, and Ability to identify triggers associated with substance abuse/mental health issues will improve  -- Long Term Goals: Improvement in symptoms so as ready for discharge     3. Medical Issues Being Addressed:   Hypertension and diabetes-continue currently ordered medications  4. Discharge Planning:   -- Social work and case management to assist with discharge planning and identification of hospital follow-up needs prior to discharge  -- Estimated LOS: 5-7 days  -- Discharge Concerns: Need to establish a safety plan; Medication compliance and effectiveness  -- Discharge Goals: Return home with outpatient referrals for mental health follow-up including medication management/psychotherapy       I certify that inpatient services furnished can reasonably be expected to improve the patient's condition.   Cristy Hilts, MD 01/29/2021, 12:31 PM
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
## 2021-01-29 NOTE — Plan of Care (Signed)
  Problem: Education: Goal: Ability to state activities that reduce stress will improve Outcome: Progressing   Problem: Coping: Goal: Ability to identify and develop effective coping behavior will improve Outcome: Progressing   Problem: Self-Concept: Goal: Ability to identify factors that promote anxiety will improve Outcome: Progressing   

## 2021-01-29 NOTE — Progress Notes (Signed)
Progress note  Pt found in bed; compliant with medication administration. Pt continues to display disorganized thoughts, with delusions as to who they and family members are. Pt has been seen in the milieu today, but hasn't been viewed interacting with peers. Pt is pleasant. Pt denies si/hi/ah/vh and verbally agrees to approach staff if these become apparent or before harming themselves/others while at [facility].  A: Pt provided support and encouragement. Pt given medication per protocol and standing orders. Q38m safety checks implemented and continued.  R: Pt safe on the unit. Will continue to monitor.

## 2021-01-29 NOTE — H&P (Signed)
Psychiatric Admission Assessment Adult  Patient Identification: Mitchell [name] MRN:  [id number] Date of Evaluation:  01/29/2021 Chief Complaint:  Schizophrenia (HCC) [F20.9] Principal Diagnosis: Schizophrenia (HCC) Diagnosis:  Principal Problem:   Schizophrenia (HCC)  History of Present Illness:   Mitchell [name] is a 60 year old male with a past history of schizophrenia presenting under IVC for acute psychosis.  Per chart review, the patient was brought in by [facility] to [facility] on 10/31 under IVC.  Per IVC documentation, the petitioner is the director of [employer], Mitchell [name], and the patient is a client of their [facility] and [employer].  Per IVC, the patient was found running outside naked and was trying to jump into his apartment building's trash compactor.  The document confirms a history of schizophrenia and that the patient has been experiencing both auditory hallucinations and delusional thoughts.  Though the patient initially arrived to [facility], he was sent to the emergency department given that he had a blood glucose of 316.  There he was started on sliding scale insulin and was given his home medication of lithium 450 mg twice daily.  His initial lithium level was essentially 0, but 2 days later was 0.7. He was not restarted on his home haloperidol 20 mg daily.  While there he received as needed lorazepam 1 mg multiple times for agitation.  On interview at [facility] on 11/3, the patient appears psychotic.  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  His speech is garbled and he is difficult to understand.  He reports that his mood is "great" and that he has been taking all of his psychiatric medication.  However he told the person completing the med rec earlier that he was not taking any of his medication before admission.  He denies  suicidal thoughts and auditory/visual hallucinations.  He does not appear to be responding to internal stimuli at the time of the interview.  However, he is found later talking to himself in his room.  The patient consents for this interviewer to reach out to his daughter Mitchell [name].  Unfortunately she was not able to be reached at the number listed in the chart.  This interviewer also reached out to the petitioner in the IVC, however, she was not able to be reached.  The remainder of the information in this HPI is obtained through chart review.  He has numerous emergency department visits for suicidal ideation as well as psychosis.  He was most recently admitted at a [facility] facility in 2020 but had an admission to [facility] at some point within the past 2 years.  At the admission in 2020 he exhibited disorganized thought process and was continued on his home haloperidol 20 mg daily and lithium 450 mg twice daily and was discharged 6 days later.  Is the patient at risk to self? Yes.    Has the patient been a risk to self in the past 6 months? Yes.    Has the patient been a risk to self within the distant past? Yes.    Is the patient a risk to others? No.  Has the patient been a risk to others in the past 6 months? No.  Has the patient been a risk to others within the distant past? No.   Prior Inpatient Therapy:  yes Prior Outpatient Therapy:  unknown  Alcohol Screening: 1. How often do you have a drink containing alcohol?: Never 2. How many drinks containing alcohol do you have on a typical day when you are drinking?: 1 or 2 3. How often do you have six or more drinks on one occasion?: Never AUDIT-C Score: 0 4. How often during the last year have you found that you were not able to stop drinking once you had started?: Never 5. How often during the last year have you failed to do what was normally expected from you because of drinking?: Never 6. How often during the last year have you  needed a first drink in the morning to get yourself going after a heavy drinking session?: Never 7. How often during the last year have you had a feeling of guilt of remorse after drinking?: Never 8. How often during the last year have you been unable to remember what happened the night before because you had been drinking?: Never 9. Have you or someone else been injured as a result of your drinking?: No 10. Has a relative or friend or a doctor or another health worker been concerned about your drinking or suggested you cut down?: No Alcohol Use Disorder Identification Test Final Score (AUDIT): 0 Substance Abuse History in the last 12 months:  unknown Consequences of Substance Abuse: unknown Previous Psychotropic Medications: Yes  Psychological Evaluations: Yes  Past Medical History: HTN, T2DM with insulin dependence  Past Surgical History:  Procedure Laterality Date   CATARACT EXTRACTION W/PHACO Right 02/14/2013   Procedure: CATARACT EXTRACTION PHACO AND INTRAOCULAR LENS PLACEMENT (IOC);  Surgeon: Mitchell FloodGreer Geiger, MD;  [facility];  Service: Ophthalmology;  Laterality: Right;   CATARACT EXTRACTION W/PHACO Left 06/13/2013   Procedure: CATARACT EXTRACTION PHACO AND INTRAOCULAR LENS PLACEMENT (IOC);  Surgeon: Mitchell FloodGreer Geiger, MD;  [facility];  Service: Ophthalmology;  Laterality: Left;   CIRCUMCISION  20 yrs. ago   EYE SURGERY     Family History: History reviewed. No pertinent family history. Family Psychiatric  History: reports an Hx of BPAD Tobacco Screening:  low risk per chart review Social History:  Social History   Substance and Sexual Activity  Alcohol Use Not Currently     Social History   Substance and Sexual Activity  Drug Use No    Additional Social History:    Allergies:  No Known Allergies Lab Results:  Results for orders placed or performed during the hospital encounter of 01/28/21 (from the past 48 hour(s))  Glucose, capillary     Status: None   Collection  Time: 01/28/21  4:56 PM  Result Value Ref Range   Glucose-Capillary 92 70 - 99 mg/dL    Comment: Glucose reference range applies only to samples taken after fasting for at least 8 hours.  Glucose, capillary     Status: Abnormal   Collection Time: 01/28/21 10:35 PM  Result Value Ref Range   Glucose-Capillary 266 (H) 70 - 99 mg/dL    Comment: Glucose reference range applies only to samples taken after fasting for at least 8 hours.  Glucose, capillary     Status: Abnormal   Collection Time: 01/29/21  6:23 AM  Result Value Ref Range   Glucose-Capillary 269 (H) 70 - 99 mg/dL    Comment: Glucose reference range applies only to samples taken after fasting for at least 8 hours.  Lithium level     Status: None   Collection Time: 01/29/21  6:34 AM  Result Value Ref Range  Lithium Lvl 0.70 0.60 - 1.20 mmol/L    Comment: Performed at [facility], [place] [address], [place], [place] [address]  Glucose, capillary     Status: Abnormal   Collection Time: 01/29/21 12:00 PM  Result Value Ref Range   Glucose-Capillary 383 (H) 70 - 99 mg/dL    Comment: Glucose reference range applies only to samples taken after fasting for at least 8 hours.    Blood Alcohol level:  Lab Results  Component Value Date   ETH <10 01/26/2021   ETH <10 [id number]    Metabolic Disorder Labs:  Lab Results  Component Value Date   HGBA1C 11.1 (H) 01/26/2021   MPG 271.87 01/26/2021   MPG 202.99 01/07/2019   No results found for: PROLACTIN Lab Results  Component Value Date   CHOL 134 05/20/2020   TRIG 294 (H) 05/20/2020   HDL 36 (L) 05/20/2020   CHOLHDL 3.7 05/20/2020   VLDL 24 07/18/2018   LDLCALC 67 05/20/2020   LDLCALC 83 07/18/2018    Current Medications: Current Facility-Administered Medications  Medication Dose Route Frequency Provider Last Rate Last Admin   acetaminophen (TYLENOL) tablet 650 mg  650 mg Oral Q6H PRN Chalmers Guest, NP       alum & mag hydroxide-simeth (MAALOX/MYLANTA)  200-200-20 MG/5ML suspension 30 mL  30 mL Oral Q4H PRN Chalmers Guest, NP       amLODipine (NORVASC) tablet 10 mg  10 mg Oral Daily Chalmers Guest, NP   10 mg at 01/29/21 1005   aspirin EC tablet 81 mg  81 mg Oral Daily Chalmers Guest, NP   81 mg at 01/29/21 1000   benztropine (COGENTIN) tablet 1 mg  1 mg Oral BID Chalmers Guest, NP   1 mg at 01/29/21 1000   cloNIDine (CATAPRES) tablet 0.2 mg  0.2 mg Oral BID Chalmers Guest, NP   0.2 mg at 01/29/21 1004   glipiZIDE (GLUCOTROL XL) 24 hr tablet 10 mg  10 mg Oral Q breakfast Chalmers Guest, NP   10 mg at 01/29/21 1201   haloperidol (HALDOL) tablet 20 mg  20 mg Oral QHS Chalmers Guest, NP   20 mg at 01/28/21 2045   insulin aspart (novoLOG) injection 0-15 Units  0-15 Units Subcutaneous TID WC Hill, Jackie Plum, MD   15 Units at 01/29/21 1204   insulin aspart (novoLOG) injection 0-5 Units  0-5 Units Subcutaneous QHS Chalmers Guest, NP   3 Units at 01/28/21 2258   insulin aspart (novoLOG) injection 6 Units  6 Units Subcutaneous TID WC Hill, Jackie Plum, MD   6 Units at 01/29/21 1203   lisinopril (ZESTRIL) tablet 40 mg  40 mg Oral Daily Chalmers Guest, NP   40 mg at 01/29/21 1004   lithium carbonate (ESKALITH) CR tablet 450 mg  450 mg Oral Q12H Chalmers Guest, NP   450 mg at 01/29/21 1000   OLANZapine zydis (ZYPREXA) disintegrating tablet 10 mg  10 mg Oral Q8H PRN Chalmers Guest, NP       And   LORazepam (ATIVAN) tablet 1 mg  1 mg Oral PRN Chalmers Guest, NP       And   ziprasidone (GEODON) injection 20 mg  20 mg Intramuscular PRN Chalmers Guest, NP       magnesium hydroxide (MILK OF MAGNESIA) suspension 30 mL  30 mL Oral Daily PRN Chalmers Guest, NP  traZODone (DESYREL) tablet 50 mg  50 mg Oral QHS PRN Novella Olive, NP   50 mg at 01/28/21 2045   PTA Medications: Medications Prior to Admission  Medication Sig Dispense Refill Last Dose   amLODipine (NORVASC) 10 MG tablet Take 1 tablet (10 mg total) by mouth daily. (Patient not taking:  Reported on 01/26/2021) 90 tablet 1    aspirin 81 MG EC tablet Take 1 tablet (81 mg total) by mouth daily. Swallow whole. 30 tablet 12    benztropine (COGENTIN) 1 MG tablet Take 1 tablet (1 mg total) by mouth 2 (two) times daily. (Patient not taking: Reported on 01/26/2021) 60 tablet 2    cloNIDine (CATAPRES) 0.2 MG tablet Take 0.2 mg by mouth 2 (two) times daily. (Patient not taking: Reported on 01/26/2021)      fluticasone (FLONASE) 50 MCG/ACT nasal spray Place 1 spray into both nostrils daily as needed for allergies.  (Patient not taking: Reported on 01/26/2021)      glipiZIDE (GLUCOTROL XL) 10 MG 24 hr tablet Take 1 tablet (10 mg total) by mouth daily with breakfast. 90 tablet 1    haloperidol (HALDOL) 20 MG tablet Take 1 tablet (20 mg total) by mouth at bedtime. (Patient not taking: Reported on 01/26/2021) 90 tablet 1    insulin glargine (LANTUS) 100 UNIT/ML injection Inject 10 Units into the skin daily.      insulin lispro (HUMALOG) 100 UNIT/ML KwikPen Inject 2-10 Units into the skin 3 (three) times daily. Per sliding scale (Patient not taking: Reported on 01/26/2021)      lactulose (CHRONULAC) 10 GM/15ML solution Take 30 mLs by mouth daily as needed for mild constipation.  (Patient not taking: Reported on 01/26/2021)      LEVEMIR FLEXTOUCH 100 UNIT/ML FlexPen Inject 20 Units into the skin at bedtime. (Patient not taking: Reported on 01/26/2021)      liraglutide (VICTOZA) 18 MG/3ML SOPN Inject 1.8 mg into the skin daily. (Patient not taking: Reported on 01/26/2021)      lisinopril (ZESTRIL) 40 MG tablet Take 1 tablet (40 mg total) by mouth daily. (Patient not taking: No sig reported) 90 tablet 1    lithium carbonate (ESKALITH) 450 MG CR tablet Take 1 tablet (450 mg total) by mouth every 12 (twelve) hours. (Patient not taking: Reported on 01/26/2021) 60 tablet 2    metFORMIN (GLUCOPHAGE) 1000 MG tablet Take 1 tablet (1,000 mg total) by mouth 2 (two) times daily with a meal. (Patient not taking:  Reported on 01/26/2021) 60 tablet 2    NOVOLOG FLEXPEN 100 UNIT/ML FlexPen Inject 5 Units into the skin with breakfast, with lunch, and with evening meal. (Patient not taking: Reported on 01/26/2021)      tobramycin-dexamethasone (TOBRADEX) ophthalmic solution Place 2 drops into both eyes every 6 (six) hours as needed. (Patient not taking: Reported on 01/26/2021)       Musculoskeletal: Strength & Muscle Tone: within normal limits Gait & Station: normal Patient leans: N/A   Psychiatric Specialty Exam:  Presentation  General Appearance: Bizarre; Disheveled  Eye Contact:Fleeting  Speech:Garbled; Slurred  Speech Volume:Normal  Handedness:Right   Mood and Affect  Mood: "great"  Affect:Constricted   Thought Process  Thought Processes:Disorganized; Irrevelant  Duration of Psychotic Symptoms: Greater than six months  Past Diagnosis of Schizophrenia or Psychoactive disorder: Yes  Descriptions of Associations:Tangential  Orientation:Full (Time, Place and Person)  Thought Content: denies suicidal thoughts and auditory/visual hallucinations, but is found talking to himself  Hallucinations: denies  Ideas of Reference:  none elicited  Suicidal Thoughts: denies  Homicidal Thoughts: none reported  Sensorium  Memory:Immediate Poor; Recent Poor; Remote Poor  Judgment:Poor  Insight:Poor   Executive Functions  Concentration:Poor  Attention Span:Poor  Recall:Poor  Fund of Knowledge:Poor  Language:Good   Psychomotor Activity  Psychomotor Activity:Psychomotor Activity: Normal   Assets  Assets:Financial Resources/Insurance; Physical Health; Resilience; Social Support   Sleep  Sleep:Sleep: Fair    Physical Exam: Physical Exam Constitutional:      Appearance: He is not ill-appearing or toxic-appearing.  Pulmonary:     Effort: Pulmonary effort is normal.  Musculoskeletal:        General: Normal range of motion.  Neurological:     General: No focal  deficit present.     Mental Status: He is alert.   Review of Systems  Respiratory:  Negative for shortness of breath.   Cardiovascular:  Negative for chest pain.  Gastrointestinal:  Negative for nausea and vomiting.  Blood pressure (!) 143/77, pulse 94, temperature 97.6 F (36.4 C), temperature source Oral, resp. rate 18, height 5' 3.5" (1.613 m), weight 91.6 kg, SpO2 100 %. Body mass index is 35.22 kg/m.  Treatment Plan Summary: Daily contact with patient to assess and evaluate symptoms and progress in treatment and Medication management  Observation Level/Precautions:  15 minute checks  Laboratory:   as below  Psychotherapy:  supportive  Medications:  as below  Consultations:  none  Discharge Concerns:  none  Estimated LOS: 3-5 days  Other:     Physician Treatment Plan for Primary Diagnosis: Schizophrenia ([place]) Long Term Goal(s): Improvement in symptoms so as ready for discharge  Short Term Goals: Ability to maintain clinical measurements within normal limits will improve  Physician Treatment Plan for Secondary Diagnosis: Principal Problem:   Schizophrenia ([place])  Long Term Goal(s): Improvement in symptoms so as ready for discharge  Short Term Goals: Compliance with prescribed medications will improve  Assessment: Schizophrenia HTN T2DM, with insulin dependence  Schizophrenia, acute psychosis -Continue Haldol 20 mg QHS for psychosis -Continue Cogentin 1 mg BID for EPS prevention -Consider LAI given lack of compliance and recurrent hospitalizations -Continue Lithium 450 mg BID for mood stability  -Last Li level of 0.7 this AM  -Li level tomorrow and BMP  Agitation med protocol ordered: -Zydis 10 mg q8hr prn for agitation  -Ativan 1 mg once for anxiety and severe agitation -Geodon 20 mg IM once for agitation Will consider changing to haldol PRN  Continue PRN's: Tylenol, Maalox, Atarax, Milk of Magnesia, Trazodone  Medical Management Covid negative CMP: Cr of  1.45 CBC: unremarkable EtOH: <10 UDS: pending TSH: not collected A1C: 11.1 Lipids: pending  T2DM with insulin dependence Poorly controlled as evidenced by A1C. Home insulin regimen of 10U lantus daily and levemir 20U QHS (?) as well as Humalog 2-10U BID.  -Continue moderate SSI -Continue QHS correction -Continue 6U pranidal coverage  -Continue glipizide Fasting CBG of 269 this AM -Consider adding basal insulin tomorrow  HTN Continue home regimen -Lisinopril 40 mg daily -Amlodipine 10 mg daily -Clonidine 0.2 mg BID  Elevated Cr Cr of 1.45 on a baseline of  1-1.2, likely pre-renal in the context of decreased PO. -BMP tomorrow AM   I certify that inpatient services furnished can reasonably be expected to improve the patient's condition.    Corky Sox, MD 11/3/20224:56 PM

## 2021-01-29 NOTE — Group Note (Signed)
Recreation Therapy Group Note   Group Topic:Leisure Education  Group Date: 01/29/2021 Start Time: 8295 End Time: 1015 Facilitators: Caroll Rancher, LRT/CTRS Location: [address] Dayroom   Goal Area(s) Addresses:  Patient will identify positive leisure activities for use post discharge. Patient will identify at least one positive benefit of participation in leisure activities.  Patient will work effectively work with peer by sharing ideas and contributing to Social worker.  Group Description: In pairs, patients were asked to create a game with their teammate. Team's were tasked with designing a game, including a Name, Description of Game, Equipment/Supplies, Rules, and Number of players needed.    Affect/Mood: N/A   Participation Level: Did not attend    Clinical Observations/Individualized Feedback:  Pt did not attend group.    Plan: Continue to engage patient in RT group sessions 2-3x/week.   Caroll Rancher, LRT/CTRS 01/29/2021 12:34 PM

## 2021-01-29 NOTE — [facility] Group Notes (Cosign Needed)
Pt. Did not attend [place]     Jeffie Spivack.  

## 2021-01-30 ENCOUNTER — Encounter (HOSPITAL_COMMUNITY): Payer: Self-pay

## 2021-01-30 DIAGNOSIS — F209 Schizophrenia, unspecified: Secondary | ICD-10-CM | POA: Diagnosis not present

## 2021-01-30 LAB — LIPID PANEL
Cholesterol: 140 mg/dL (ref 0–200)
HDL: 49 mg/dL (ref >40)
LDL Cholesterol: 72 mg/dL (ref 0–99)
Total CHOL/HDL Ratio: 2.9 RATIO
Triglycerides: 95 mg/dL (ref <150)
VLDL: 19 mg/dL (ref 0–40)

## 2021-01-30 LAB — GLUCOSE, CAPILLARY
Glucose-Capillary: 229 mg/dL — ABNORMAL HIGH (ref 70–99)
Glucose-Capillary: 278 mg/dL — ABNORMAL HIGH (ref 70–99)
Glucose-Capillary: 456 mg/dL — ABNORMAL HIGH (ref 70–99)
Glucose-Capillary: 453 mg/dL — ABNORMAL HIGH (ref 70–99)
Glucose-Capillary: 500 mg/dL — ABNORMAL HIGH (ref 70–99)
Glucose-Capillary: 375 mg/dL — ABNORMAL HIGH (ref 70–99)

## 2021-01-30 LAB — BASIC METABOLIC PANEL
Anion gap: 7 (ref 5–15)
BUN: 21 mg/dL — ABNORMAL HIGH (ref 6–20)
CO2: 23 mmol/L (ref 22–32)
Calcium: 9.3 mg/dL (ref 8.9–10.3)
Chloride: 106 mmol/L (ref 98–111)
Creatinine, Ser: 1.29 mg/dL — ABNORMAL HIGH (ref 0.61–1.24)
GFR, Estimated: >60 mL/min (ref >60)
Glucose, Bld: 235 mg/dL — ABNORMAL HIGH (ref 70–99)
Potassium: 4.2 mmol/L (ref 3.5–5.1)
Sodium: 136 mmol/L (ref 135–145)

## 2021-01-30 LAB — LITHIUM LEVEL: Lithium Lvl: 0.61 mmol/L (ref 0.60–1.20)

## 2021-01-30 MED ORDER — INSULIN GLARGINE-YFGN 100 UNIT/ML ~~LOC~~ SOLN
10.0000 [IU] | Freq: Every day | SUBCUTANEOUS | Status: DC
Start: 2021-01-30 — End: 2021-02-02
  Administered 2021-01-30 – 2021-02-01 (×3): 10 [IU] via SUBCUTANEOUS

## 2021-01-30 MED ORDER — INSULIN ASPART 100 UNIT/ML IJ SOLN
5.0000 [IU] | Freq: Once | INTRAMUSCULAR | Status: AC
  Administered 2021-01-30: 5 [IU] via SUBCUTANEOUS

## 2021-01-30 MED ORDER — INSULIN GLARGINE-YFGN 100 UNIT/ML ~~LOC~~ SOLN: 10.0000 [IU] | Freq: Every day | SUBCUTANEOUS | Status: DC

## 2021-01-30 NOTE — Progress Notes (Signed)
   01/30/21 0300  Psych Admission Type (Psych Patients Only)  Admission Status Involuntary  Psychosocial Assessment  Eye Contact Fair  Facial Expression Anxious;Pensive;Worried  Affect Anxious;Preoccupied  Research scientist (physical sciences) Activity Slow  Appearance/Hygiene Agricultural consultant Cooperative;Appropriate to situation  Mood Anxious;Suspicious;Preoccupied  Thought Administrator, sports thinking;Disorganized  Content Confabulation  Delusions None reported or observed  Perception Derealization;Hallucinations  Hallucination Auditory  Judgment Poor  Confusion Mild  Danger to Self  Current suicidal ideation? Denies  Danger to Others  Danger to Others None reported or observed

## 2021-01-30 NOTE — Group Note (Signed)
LCSW Group Therapy Notes  Type of Therapy and Topic: Group Therapy: Healthy Vs. Unhealthy Coping Strategies  Date and Time: 01/30/2021 at 11:00am  Participation Level: [facility] PARTICIPATION LEVEL: Did Not Attend  Description of Group:  In this group, patients will be encouraged to explore their healthy and unhealthy coping strategics. Coping strategies are actions that we take to deal with stress, problems, or uncomfortable emotions in our daily lives. Each patient will be challenged to read some scenarios and discuss the unhealthy and healthy coping strategies within those scenarios. Also, each patient will be challenged to describe current healthy and unhealthy strategies that they use in their own lives and discuss the outcomes and barriers to those strategies. This group will be process-oriented, with patients participating in exploration of their own experiences as well as giving and receiving support and challenge from other group members.  Therapeutic Goals: Patient will identify personal healthy and unhealthy coping strategies. Patient will identify healthy and unhealthy coping strategies, in others, through scenarios.  Patient will identify expected outcomes of healthy and unhealthy coping strategies. Patient will identify barriers to using healthy coping strategies.   Summary of Patient Progress: Did not attend   Therapeutic Modalities:  Cognitive Behavioral Therapy Solution Focused Therapy Motivational Interviewing   Ruthann Cancer MSW, LCSW Clincal Social Worker  [facility]

## 2021-01-30 NOTE — [facility] Group Notes (Signed)
Spirituality group facilitated by Kathleen Argue, BCC.   Group Description: Group focused on topic of hope. Patients participated in facilitated discussion around topic, connecting with one another around experiences and definitions for hope. Group members engaged with visual explorer photos, reflecting on what hope looks like for them today. Group engaged in discussion around how their definitions of hope are present today in hospital.   Modalities: Psycho-social ed, Adlerian, Narrative, MI   Patient Progress:  Lakai introduced himself as [name].  He shared about his faith and how important it is to him and how he finds hope in his faith and in his family.    Chaplain Dyanne Carrel, Bcc Pager, [phone] 10:24 PM

## 2021-01-30 NOTE — Progress Notes (Signed)
Adult Psychoeducational Group Note  Date:  01/30/2021 Time:  8:50 PM  Group Topic/Focus:  Wrap-Up Group:   The focus of this group is to help patients review their daily goal of treatment and discuss progress on daily workbooks.  Participation Level:  Active  Participation Quality:  Appropriate  Affect:  Anxious, Blunted, and Irritable  Cognitive:  Disorganized, Confused, Delusional, and Hallucinating  Insight: Limited  Engagement in Group:  Lacking, Limited, Off Topic, and Poor  Modes of Intervention:  Discussion  Additional Comments:  Pt stated his goal for today was to focus on his treatment plan. Pt stated he accomplished his goal today. Pt stated he talked with his doctor and social worker about his care today. Pt rated his overall day a 10. Pt stated he made no calls today. Pt stated he felt better about himself today. Pt stated he was able to attend all meals. Pt stated he took all medications provided today. Pt stated he attend all groups held today. Pt stated his appetite was pretty good today. Pt rated sleep last night was pretty good. Pt stated the goal tonight was to get some rest. Pt stated he had no physical pain today. Pt deny visual hallucinations and auditory issues tonight. Pt denies thoughts of harming himself or others. Pt stated he would alert staff if anything changed.  [name] 01/30/2021, 8:50 PM

## 2021-01-30 NOTE — BH IP Treatment Plan (Signed)
Interdisciplinary Treatment and Diagnostic Plan Update  01/30/2021 Time of Session:  Mitchell [name] MRN: [id number]  Principal Diagnosis: Schizophrenia [facility])  Secondary Diagnoses: Principal Problem:   Schizophrenia ([place])   Current Medications:  Current Facility-Administered Medications  Medication Dose Route Frequency Provider Last Rate Last Admin   acetaminophen (TYLENOL) tablet 650 mg  650 mg Oral Q6H PRN Chalmers Guest, NP       alum & mag hydroxide-simeth (MAALOX/MYLANTA) 200-200-20 MG/5ML suspension 30 mL  30 mL Oral Q4H PRN Chalmers Guest, NP       amLODipine (NORVASC) tablet 10 mg  10 mg Oral Daily Chalmers Guest, NP   10 mg at 01/30/21 1047   aspirin EC tablet 81 mg  81 mg Oral Daily Chalmers Guest, NP   81 mg at 01/30/21 1047   benztropine (COGENTIN) tablet 1 mg  1 mg Oral BID Chalmers Guest, NP   1 mg at 01/30/21 1047   cloNIDine (CATAPRES) tablet 0.2 mg  0.2 mg Oral BID Chalmers Guest, NP   0.2 mg at 01/30/21 1047   glipiZIDE (GLUCOTROL XL) 24 hr tablet 10 mg  10 mg Oral Q breakfast Chalmers Guest, NP   10 mg at 01/30/21 1047   haloperidol (HALDOL) tablet 20 mg  20 mg Oral QHS Chalmers Guest, NP   20 mg at 01/29/21 2059   insulin aspart (novoLOG) injection 0-15 Units  0-15 Units Subcutaneous TID WC Hill, Jackie Plum, MD   8 Units at 01/30/21 1205   insulin aspart (novoLOG) injection 0-5 Units  0-5 Units Subcutaneous QHS Chalmers Guest, NP   3 Units at 01/28/21 2258   insulin aspart (novoLOG) injection 6 Units  6 Units Subcutaneous TID WC Maida Sale, MD   6 Units at 01/30/21 1204   insulin glargine-yfgn (SEMGLEE) injection 10 Units  10 Units Subcutaneous Daily Corky Sox, MD   10 Units at 01/30/21 1048   lisinopril (ZESTRIL) tablet 40 mg  40 mg Oral Daily Chalmers Guest, NP   40 mg at 01/30/21 1047   lithium carbonate (ESKALITH) CR tablet 450 mg  450 mg Oral Q12H Chalmers Guest, NP   450 mg at 01/30/21 1047   OLANZapine zydis (ZYPREXA) disintegrating tablet  10 mg  10 mg Oral Q8H PRN Chalmers Guest, NP   10 mg at 01/30/21 0401   And   LORazepam (ATIVAN) tablet 1 mg  1 mg Oral PRN Chalmers Guest, NP       And   ziprasidone (GEODON) injection 20 mg  20 mg Intramuscular PRN Chalmers Guest, NP       magnesium hydroxide (MILK OF MAGNESIA) suspension 30 mL  30 mL Oral Daily PRN Chalmers Guest, NP       traZODone (DESYREL) tablet 50 mg  50 mg Oral QHS PRN Chalmers Guest, NP   50 mg at 01/29/21 2059   PTA Medications: Medications Prior to Admission  Medication Sig Dispense Refill Last Dose   amLODipine (NORVASC) 10 MG tablet Take 1 tablet (10 mg total) by mouth daily. (Patient not taking: Reported on 01/26/2021) 90 tablet 1    aspirin 81 MG EC tablet Take 1 tablet (81 mg total) by mouth daily. Swallow whole. 30 tablet 12    benztropine (COGENTIN) 1 MG tablet Take 1 tablet (1 mg total) by mouth 2 (two) times daily. (Patient not taking: Reported on 01/26/2021) 60 tablet 2    cloNIDine (CATAPRES)  0.2 MG tablet Take 0.2 mg by mouth 2 (two) times daily. (Patient not taking: Reported on 01/26/2021)      fluticasone (FLONASE) 50 MCG/ACT nasal spray Place 1 spray into both nostrils daily as needed for allergies.  (Patient not taking: Reported on 01/26/2021)      glipiZIDE (GLUCOTROL XL) 10 MG 24 hr tablet Take 1 tablet (10 mg total) by mouth daily with breakfast. 90 tablet 1    haloperidol (HALDOL) 20 MG tablet Take 1 tablet (20 mg total) by mouth at bedtime. (Patient not taking: Reported on 01/26/2021) 90 tablet 1    insulin glargine (LANTUS) 100 UNIT/ML injection Inject 10 Units into the skin daily.      insulin lispro (HUMALOG) 100 UNIT/ML KwikPen Inject 2-10 Units into the skin 3 (three) times daily. Per sliding scale (Patient not taking: Reported on 01/26/2021)      lactulose (CHRONULAC) 10 GM/15ML solution Take 30 mLs by mouth daily as needed for mild constipation.  (Patient not taking: Reported on 01/26/2021)      LEVEMIR FLEXTOUCH 100 UNIT/ML FlexPen Inject  20 Units into the skin at bedtime. (Patient not taking: Reported on 01/26/2021)      liraglutide (VICTOZA) 18 MG/3ML SOPN Inject 1.8 mg into the skin daily. (Patient not taking: Reported on 01/26/2021)      lisinopril (ZESTRIL) 40 MG tablet Take 1 tablet (40 mg total) by mouth daily. (Patient not taking: No sig reported) 90 tablet 1    lithium carbonate (ESKALITH) 450 MG CR tablet Take 1 tablet (450 mg total) by mouth every 12 (twelve) hours. (Patient not taking: Reported on 01/26/2021) 60 tablet 2    metFORMIN (GLUCOPHAGE) 1000 MG tablet Take 1 tablet (1,000 mg total) by mouth 2 (two) times daily with a meal. (Patient not taking: Reported on 01/26/2021) 60 tablet 2    NOVOLOG FLEXPEN 100 UNIT/ML FlexPen Inject 5 Units into the skin with breakfast, with lunch, and with evening meal. (Patient not taking: Reported on 01/26/2021)      tobramycin-dexamethasone (TOBRADEX) ophthalmic solution Place 2 drops into both eyes every 6 (six) hours as needed. (Patient not taking: Reported on 01/26/2021)       Patient Stressors: Health problems   Medication change or noncompliance    Patient Strengths: Capable of independent living  Supportive family/friends   Treatment Modalities: Medication Management, Group therapy, Case management,  1 to 1 session with clinician, Psychoeducation, Recreational therapy.   Physician Treatment Plan for Primary Diagnosis: Schizophrenia ([place]) Long Term Goal(s): Improvement in symptoms so as ready for discharge   Short Term Goals: Compliance with prescribed medications will improve Ability to maintain clinical measurements within normal limits will improve  Medication Management: Evaluate patient's response, side effects, and tolerance of medication regimen.  Therapeutic Interventions: 1 to 1 sessions, Unit Group sessions and Medication administration.  Evaluation of Outcomes: Not Met  Physician Treatment Plan for Secondary Diagnosis: Principal Problem:   Schizophrenia  ([place])  Long Term Goal(s): Improvement in symptoms so as ready for discharge   Short Term Goals: Compliance with prescribed medications will improve Ability to maintain clinical measurements within normal limits will improve     Medication Management: Evaluate patient's response, side effects, and tolerance of medication regimen.  Therapeutic Interventions: 1 to 1 sessions, Unit Group sessions and Medication administration.  Evaluation of Outcomes: Not Met   RN Treatment Plan for Primary Diagnosis: Schizophrenia ([place]) Long Term Goal(s): Knowledge of disease and therapeutic regimen to maintain health will improve  Short Term  Goals: Ability to demonstrate self-control, Ability to participate in decision making will improve, and Ability to verbalize feelings will improve  Medication Management: RN will administer medications as ordered by provider, will assess and evaluate patient's response and provide education to patient for prescribed medication. RN will report any adverse and/or side effects to prescribing provider.  Therapeutic Interventions: 1 on 1 counseling sessions, Psychoeducation, Medication administration, Evaluate responses to treatment, Monitor vital signs and CBGs as ordered, Perform/monitor CIWA, COWS, AIMS and Fall Risk screenings as ordered, Perform wound care treatments as ordered.  Evaluation of Outcomes: Not Met   LCSW Treatment Plan for Primary Diagnosis: Schizophrenia ([place]) Long Term Goal(s): Safe transition to appropriate next level of care at discharge, Engage patient in therapeutic group addressing interpersonal concerns.  Short Term Goals: Engage patient in aftercare planning with referrals and resources, Increase social support, and Increase ability to appropriately verbalize feelings  Therapeutic Interventions: Assess for all discharge needs, 1 to 1 time with Social worker, Explore available resources and support systems, Assess for adequacy in community  support network, Educate family and significant other(s) on suicide prevention, Complete Psychosocial Assessment, Interpersonal group therapy.  Evaluation of Outcomes: Not Met   Progress in Treatment: Attending groups: Yes. and No. Participating in groups: Yes. and No. Taking medication as prescribed: Yes. Toleration medication: Yes. Family/Significant other contact made: No, will contact:  declined consents Patient understands diagnosis: No. Discussing patient identified problems/goals with staff: No. Medical problems stabilized or resolved: Yes. Denies suicidal/homicidal ideation: Yes. Issues/concerns per patient self-inventory: No. Other: None  New problem(s) identified: No, Describe:  None  New Short Term/Long Term Goal(s):medication stabilization, elimination of SI thoughts, development of comprehensive mental wellness plan.   Patient Goals:  no goal  Discharge Plan or Barriers: Patient recently admitted. CSW will continue to follow and assess for appropriate referrals and possible discharge planning.   Reason for Continuation of Hospitalization: Delusions  Hallucinations Medication stabilization  Estimated Length of Stay: 3-5 days   Scribe for Treatment Team: Eliott Nine 01/30/2021 3:02 PM

## 2021-01-30 NOTE — Progress Notes (Signed)
BS found to be 456. MD notified. Sliding scale and standing insulin to be given. Will recheck BS after 1 hr to reassess.

## 2021-01-30 NOTE — Hospital Course (Signed)
D/c cogentin long term

## 2021-01-30 NOTE — Progress Notes (Signed)
Patient Bg 406 mg /dl at [facility] Provider notified OTOof 7 uits novolog injection subcutaneous given. Patient asymptomatic. Patient  responding to internal stimuli. Irritable and agitated. Prn Zyprexa given at 4 am Patient has been awake since 3 am responding loud and agitated "if you don't give a fuck about me I don't give a fuck about you"   Support and encouragement provided.

## 2021-01-30 NOTE — Progress Notes (Signed)
Inpatient Diabetes Program Recommendations  AACE/ADA: New Consensus Statement on Inpatient Glycemic Control (2015)  Target Ranges:  Prepandial:   less than 140 mg/dL      Peak postprandial:   less than 180 mg/dL (1-2 hours)      Critically ill patients:  140 - 180 mg/dL   Lab Results  Component Value Date   GLUCAP 229 (H) 01/30/2021   HGBA1C 11.1 (H) 01/26/2021   Diabetes history: DM 2 Outpatient Diabetes medications: Glipizide 10 mg Daily, Lantus 10 units Daily, Humalog 2-10 units tid (not taking), Victoza in the past Current orders for Inpatient glycemic control:  Glipizide 10 mg Daily Novolog 0-15 units tid + hs Novolog 6 units tid meal coverage    Inpatient Diabetes Program Recommendations:     -  Add Semglee 10 units   Thank you. Ailene Ards, RD, LDN, CDE Inpatient Diabetes Coordinator [phone]

## 2021-01-30 NOTE — Progress Notes (Addendum)
[facility] MD Progress Note  01/30/2021 2:57 PM JAPETH DULSKI  MRN:  [id number] Subjective:   Mitchell [name] is a 60 year old male with a past history of schizophrenia presenting under IVC for acute psychosis. He was exhibiting bizarre behavior (e.g. running through the streets naked).   The patient's chart was reviewed and nursing notes were reviewed. Over the past 24 hrs, the patient received PRN Zyprexa 10 mg for agitation. The patient's case was discussed in multidisciplinary team meeting.   On interview this morning, the patient appears psychotic, similar to yesterday. He relates that he was able to go to several groups yesterday.  He reported one of the activities was to name his strengths.  When asked to describe his strengths, he reports that his power and loving heart are his strengths.  He is asked about his belief that he owns [employer] and that his previous psychiatric hospital was blown up by a bomb.  He confirms both these delusions and speaks at length about them.  When asked to name the president as he states "it's me, y'all know that".  His orientation is assessed.  He believes it is 3 October in the year 2020 and states that it is a Tuesday.  None of this is correct.  He is unable to state that he is in a psychiatric hospital.  He is unable to spell world forwards or subtract 7 serially from 100.  Principal Problem: Schizophrenia ([place]) Diagnosis: Principal Problem:   Schizophrenia ([place])  Total Time Spent in Direct Patient Care: I personally spent 30 minutes on the unit in direct patient care. The direct patient care time included face-to-face time with the patient, reviewing the patient's chart, communicating with other professionals, and coordinating care. Greater than 50% of this time was spent in counseling or coordinating care with the patient regarding goals of hospitalization, psycho-education, and discharge planning needs.   Past Psychiatric History: schizophrenia/schizoaffective  disorder  Past Medical History:  Past Medical History:  Diagnosis Date   Bipolar affective disorder ([place])    takes Zyprexa daily   Diabetes mellitus    takes Victoza,Metformin,and Glipizide daily   Hypertension    takes Amlodipine,Lisinopril and Clonidine daily   Hyponatremia    history of   Mental disorder    takes Lithium daily   Schizoaffective disorder    takes Trazodone nightly   Seasonal allergies    takes Claritin daily   Sleep apnea    sleep study >42yrs ago   Stroke [facility])    left arm weakness   Past Surgical History:  Procedure Laterality Date   CATARACT EXTRACTION W/PHACO Right 02/14/2013   Procedure: CATARACT EXTRACTION PHACO AND INTRAOCULAR LENS PLACEMENT ([place]);  Surgeon: Adonis Brook, MD;  Location: [place];  Service: Ophthalmology;  Laterality: Right;   CATARACT EXTRACTION W/PHACO Left 06/13/2013   Procedure: CATARACT EXTRACTION PHACO AND INTRAOCULAR LENS PLACEMENT ([facility]);  Surgeon: Adonis Brook, MD;  Location: [place];  Service: Ophthalmology;  Laterality: Left;   CIRCUMCISION  20 yrs. ago   EYE SURGERY     Family History: History reviewed. No pertinent family history. Family Psychiatric  History: reports an Hx of BPAD Social History:  Social History   Substance and Sexual Activity  Alcohol Use Not Currently     Social History   Substance and Sexual Activity  Drug Use No    Social History   Socioeconomic History   Marital status: Divorced    Spouse name: Not on file   Number of children:  Not on file   Years of education: Not on file   Highest education level: Not on file  Occupational History   Not on file  Tobacco Use   Smoking status: Never   Smokeless tobacco: Never  Vaping Use   Vaping Use: Never used  Substance and Sexual Activity   Alcohol use: Not Currently   Drug use: No   Sexual activity: Yes    Birth control/protection: None  Other Topics Concern   Not on file  Social History Narrative   Not on file   Social Determinants of  Health   Financial Resource Strain: Not on file  Food Insecurity: Not on file  Transportation Needs: Not on file  Physical Activity: Not on file  Stress: Not on file  Social Connections: Not on file   Additional Social History:     Sleep: Fair  Appetite:  Fair  Current Medications: Current Facility-Administered Medications  Medication Dose Route Frequency Provider Last Rate Last Admin   acetaminophen (TYLENOL) tablet 650 mg  650 mg Oral Q6H PRN Chalmers Guest, NP       alum & mag hydroxide-simeth (MAALOX/MYLANTA) 200-200-20 MG/5ML suspension 30 mL  30 mL Oral Q4H PRN Chalmers Guest, NP       amLODipine (NORVASC) tablet 10 mg  10 mg Oral Daily Chalmers Guest, NP   10 mg at 01/30/21 1047   aspirin EC tablet 81 mg  81 mg Oral Daily Chalmers Guest, NP   81 mg at 01/30/21 1047   benztropine (COGENTIN) tablet 1 mg  1 mg Oral BID Chalmers Guest, NP   1 mg at 01/30/21 1047   cloNIDine (CATAPRES) tablet 0.2 mg  0.2 mg Oral BID Chalmers Guest, NP   0.2 mg at 01/30/21 1047   glipiZIDE (GLUCOTROL XL) 24 hr tablet 10 mg  10 mg Oral Q breakfast Chalmers Guest, NP   10 mg at 01/30/21 1047   haloperidol (HALDOL) tablet 20 mg  20 mg Oral QHS Chalmers Guest, NP   20 mg at 01/29/21 2059   insulin aspart (novoLOG) injection 0-15 Units  0-15 Units Subcutaneous TID WC Hill, Jackie Plum, MD   8 Units at 01/30/21 1205   insulin aspart (novoLOG) injection 0-5 Units  0-5 Units Subcutaneous QHS Chalmers Guest, NP   3 Units at 01/28/21 2258   insulin aspart (novoLOG) injection 6 Units  6 Units Subcutaneous TID WC Hill, Jackie Plum, MD   6 Units at 01/30/21 1204   insulin glargine-yfgn (SEMGLEE) injection 10 Units  10 Units Subcutaneous Daily Corky Sox, MD   10 Units at 01/30/21 1048   lisinopril (ZESTRIL) tablet 40 mg  40 mg Oral Daily Chalmers Guest, NP   40 mg at 01/30/21 1047   lithium carbonate (ESKALITH) CR tablet 450 mg  450 mg Oral Q12H Chalmers Guest, NP   450 mg at 01/30/21 1047    OLANZapine zydis (ZYPREXA) disintegrating tablet 10 mg  10 mg Oral Q8H PRN Chalmers Guest, NP   10 mg at 01/30/21 0401   And   LORazepam (ATIVAN) tablet 1 mg  1 mg Oral PRN Chalmers Guest, NP       And   ziprasidone (GEODON) injection 20 mg  20 mg Intramuscular PRN Chalmers Guest, NP       magnesium hydroxide (MILK OF MAGNESIA) suspension 30 mL  30 mL Oral Daily PRN Chalmers Guest, NP  traZODone (DESYREL) tablet 50 mg  50 mg Oral QHS PRN Chalmers Guest, NP   50 mg at 01/29/21 2059    Lab Results:  Results for orders placed or performed during the hospital encounter of 01/28/21 (from the past 48 hour(s))  Glucose, capillary     Status: None   Collection Time: 01/28/21  4:56 PM  Result Value Ref Range   Glucose-Capillary 92 70 - 99 mg/dL    Comment: Glucose reference range applies only to samples taken after fasting for at least 8 hours.  Glucose, capillary     Status: Abnormal   Collection Time: 01/28/21 10:35 PM  Result Value Ref Range   Glucose-Capillary 266 (H) 70 - 99 mg/dL    Comment: Glucose reference range applies only to samples taken after fasting for at least 8 hours.  Glucose, capillary     Status: Abnormal   Collection Time: 01/29/21  6:23 AM  Result Value Ref Range   Glucose-Capillary 269 (H) 70 - 99 mg/dL    Comment: Glucose reference range applies only to samples taken after fasting for at least 8 hours.  Lithium level     Status: None   Collection Time: 01/29/21  6:34 AM  Result Value Ref Range   Lithium Lvl 0.70 0.60 - 1.20 mmol/L    Comment: Performed at [facility], [place] [address], [place], [place] [address]  Glucose, capillary     Status: Abnormal   Collection Time: 01/29/21 12:00 PM  Result Value Ref Range   Glucose-Capillary 383 (H) 70 - 99 mg/dL    Comment: Glucose reference range applies only to samples taken after fasting for at least 8 hours.  Glucose, capillary     Status: Abnormal   Collection Time: 01/29/21  5:09 PM  Result  Value Ref Range   Glucose-Capillary 294 (H) 70 - 99 mg/dL    Comment: Glucose reference range applies only to samples taken after fasting for at least 8 hours.  Glucose, capillary     Status: Abnormal   Collection Time: 01/29/21  8:25 PM  Result Value Ref Range   Glucose-Capillary 406 (H) 70 - 99 mg/dL    Comment: Glucose reference range applies only to samples taken after fasting for at least 8 hours.  Glucose, capillary     Status: Abnormal   Collection Time: 01/30/21  6:09 AM  Result Value Ref Range   Glucose-Capillary 229 (H) 70 - 99 mg/dL    Comment: Glucose reference range applies only to samples taken after fasting for at least 8 hours.  Lithium level     Status: None   Collection Time: 01/30/21  6:19 AM  Result Value Ref Range   Lithium Lvl 0.61 0.60 - 1.20 mmol/L    Comment: Performed at [facility], [place] [address] [place], [place] [id number]  Basic metabolic panel     Status: Abnormal   Collection Time: 01/30/21  6:19 AM  Result Value Ref Range   Sodium 136 135 - 145 mmol/L   Potassium 4.2 3.5 - 5.1 mmol/L   Chloride 106 98 - 111 mmol/L   CO2 23 22 - 32 mmol/L   Glucose, Bld 235 (H) 70 - 99 mg/dL    Comment: Glucose reference range applies only to samples taken after fasting for at least 8 hours.   BUN 21 (H) 6 - 20 mg/dL   Creatinine, Ser 1.29 (H) 0.61 - 1.24 mg/dL   Calcium 9.3 8.9 - 10.3 mg/dL  GFR, Estimated >60 >60 mL/min    Comment: (NOTE) Calculated using the CKD-EPI Creatinine Equation (2021)    Anion gap 7 5 - 15    Comment: Performed at [facility], [address], [place], [place] [address]  Lipid panel     Status: None   Collection Time: 01/30/21  6:19 AM  Result Value Ref Range   Cholesterol 140 0 - 200 mg/dL   Triglycerides 95 <676 mg/dL   HDL 49 >72 mg/dL   Total CHOL/HDL Ratio 2.9 RATIO   VLDL 19 0 - 40 mg/dL   LDL Cholesterol 72 0 - 99 mg/dL    Comment:        Total Cholesterol/HDL:CHD Risk Coronary  Heart Disease Risk Table                     Men   Women  1/2 Average Risk   3.4   3.3  Average Risk       5.0   4.4  2 X Average Risk   9.6   7.1  3 X Average Risk  23.4   11.0        Use the calculated Patient Ratio above and the CHD Risk Table to determine the patient's CHD Risk.        ATP III CLASSIFICATION (LDL):  <100     mg/dL   Optimal  094-709  mg/dL   Near or Above                    Optimal  130-159  mg/dL   Borderline  628-366  mg/dL   High  >294     mg/dL   Very High Performed at [facility], [address] [place], [place] [address]   Glucose, capillary     Status: Abnormal   Collection Time: 01/30/21 11:48 AM  Result Value Ref Range   Glucose-Capillary 278 (H) 70 - 99 mg/dL    Comment: Glucose reference range applies only to samples taken after fasting for at least 8 hours.    Blood Alcohol level:  Lab Results  Component Value Date   ETH <10 01/26/2021   ETH <10 01/21/2021    Metabolic Disorder Labs: Lab Results  Component Value Date   HGBA1C 11.1 (H) 01/26/2021   MPG 271.87 01/26/2021   MPG 202.99 01/07/2019   No results found for: PROLACTIN Lab Results  Component Value Date   CHOL 140 01/30/2021   TRIG 95 01/30/2021   HDL 49 01/30/2021   CHOLHDL 2.9 01/30/2021   VLDL 19 01/30/2021   LDLCALC 72 01/30/2021   LDLCALC 67 05/20/2020    Physical Findings: AIMS: not yet assessed  Musculoskeletal: Strength & Muscle Tone: within normal limits Gait & Station: normal Patient leans: N/A  Psychiatric Specialty Exam:  Presentation  General Appearance: Bizarre; Disheveled  Eye Contact:Fleeting  Speech:Garbled; Slurred  Speech Volume:Normal  Handedness:Right   Mood and Affect  Mood: "great"  Affect: constricted but more agreeable than yesterday  Thought Process  Thought Processes: Disorganized; Irrevelant  Descriptions of Associations:Tangential  Orientation:Full (Time, Place and Person)  Thought Content:  frank delusions,    History of Schizophrenia/Schizoaffective disorder:Yes  Duration of Psychotic Symptoms:Greater than six months  Hallucinations:Hallucinations: -- (patient denied but appears to be responding to internal stimuli)  Ideas of Reference:Delusions  Suicidal Thoughts: none reported Homicidal Thoughts: none reported  Sensorium  Memory:Immediate Poor; Recent Poor; Remote Poor  Judgment:Poor  Insight:Poor  Executive Functions  Concentration:Poor  Attention Span:Poor  Recall:Poor  [employer] of Knowledge:Poor  Language:Good   Psychomotor Activity  Psychomotor Activity:Psychomotor Activity: Normal   Assets  Assets:Financial Resources/Insurance; Physical Health; Resilience; Social Support   Sleep  Sleep:Sleep: Fair    Physical Exam: Physical Exam Vitals reviewed.  Constitutional:      Appearance: He is not ill-appearing or toxic-appearing.  Pulmonary:     Effort: Pulmonary effort is normal.  Musculoskeletal:        General: Normal range of motion.  Neurological:     General: No focal deficit present.     Mental Status: He is alert.   Review of Systems  Respiratory:  Negative for shortness of breath.   Cardiovascular:  Negative for chest pain.  Gastrointestinal:  Negative for nausea and vomiting.  Blood pressure 124/84, pulse (!) 103, temperature 99.3 F (37.4 C), temperature source Oral, resp. rate 18, height 5' 3.5" (1.613 m), weight 91.6 kg, SpO2 97 %. Body mass index is 35.22 kg/m.  Assessment: Schizophrenia HTN T2DM, with insulin dependence   Schizophrenia, acute psychosis -Continue Haldol 20 mg QHS for psychosis -Consider addition of Zyprexa tomorrow if psychosis fails to improve -Continue Cogentin 1 mg BID for EPS prevention -Consider LAI given lack of compliance and recurrent hospitalizations -Continue Lithium 450 mg BID for mood stability             -Li level of 0->0.7->0.6 over consecutive days             -Li level tomorrow  and BMP   Agitation med protocol ordered: -Zydis 10 mg q8hr prn for agitation  -Ativan 1 mg once for anxiety and severe agitation -Geodon 20 mg IM once for agitation Will consider changing to haldol PRN   Continue PRN's: Tylenol, Maalox, Atarax, Milk of Magnesia, Trazodone   Medical Management Covid negative CMP: Cr of 1.45 CBC: unremarkable EtOH: <10 UDS: pending TSH: not collected A1C: 11.1 Lipids: pending   T2DM with insulin dependence Poorly controlled as evidenced by A1C. Home insulin regimen of 10U lantus daily and levemir 20U QHS (?) as well as Humalog 2-10U BID. Fasting CBG of 229 this AM with CBGs up to 400 yesterday.  -Added 10U glargine this AM, likely increase tomorrow -Continue moderate SSI -Continue QHS correction -Continue 6U pranidal coverage  -Continue glipizide   HTN Continue home regimen -Lisinopril 40 mg daily -Amlodipine 10 mg daily -Clonidine 0.2 mg BID   Elevated Cr (resolved) Cr of 1.45 on a baseline of  1-1.2 at admission, likely pre-renal in the context of decreased PO. Cr down to 1.29.   Corky Sox, MD 01/30/2021, 2:57 PM  Total Time Spent in Direct Patient Care:  I personally spent 30 minutes on the unit in direct patient care. The direct patient care time included face-to-face time with the patient, reviewing the patient's chart, communicating with other professionals, and coordinating care. Greater than 50% of this time was spent in counseling or coordinating care with the patient regarding goals of hospitalization, psycho-education, and discharge planning needs.  I have independently evaluated the patient during a face-to-face assessment on 01/30/21. I reviewed the patient's chart, and I participated in key portions of the service. I discussed the case with the [employer], and I agree with the assessment and plan of care as documented in the [employer] note, as addended by me or notated below:  Agree with note and plan. Will  continue current psych meds for now and consider changing tomorrow, depending on  presentation, due to restarting meds on admission.  Might consider other antipsychotic to augment haldol for psychosis, over the weekend.  F/u labs in the morning.    Janine Limbo, MD Psychiatrist

## 2021-01-30 NOTE — Plan of Care (Signed)
  Problem: Self-Concept: Goal: Level of anxiety will decrease Outcome: Progressing Goal: Ability to modify response to factors that promote anxiety will improve Outcome: Progressing   Problem: Education: Goal: Knowledge of  General Education information/materials will improve Outcome: Progressing   

## 2021-01-30 NOTE — Group Note (Signed)
Recreation Therapy Group Note   Group Topic:Other  Group Date: 01/30/2021 Start Time: 1010 End Time: 1035 Facilitators: Caroll Rancher, LRT/CTRS Location: [address] Dayroom   Goal Area(s) Addresses:  Patient will identify positive stress management techniques. Patient will identify benefits of using stress management post d/c.  Group Description:  Meditation.  LRT played a meditation that focused on forgiveness of self and how it can be hard to show grace to oneself after a bad decision or not accomplishing a goal.  Patients were to listen and follow along with the meditation as it played to engage in activity.  LRT also gave patients options on platforms to use other stress management techniques such as Youtube, Apps and the Internet.     Affect/Mood: Appropriate   Participation Level: None   Participation Quality: None   Behavior: Hallucinating   Speech/Thought Process: Delusional   Insight: Impaired   Judgement: Impaired   Modes of Intervention: STEM Activity   Patient Response to Interventions:  None   Education Outcome:  Acknowledges education and In group clarification offered    Clinical Observations/Individualized Feedback: Pt did not participated in activity.  Pt sat in the corner by the window and was talking to someone not present.  Pt wasn't loud but was talking in a soft tone.     Plan: Continue to engage patient in RT group sessions 2-3x/week.   Caroll Rancher, LRT/CTRS 01/30/2021 12:31 PM

## 2021-01-30 NOTE — Progress Notes (Signed)
Progress note  Pt found in bed in bed; compliant with medication administration. Pt continues to respond to internal stimuli. Pt's roommate was moved because of this. Pt was viewed taking a staff members property. Pt returned this on approach. Pt is pleasant and has been seen in the hallway but hasn't interacted with peers. Pt denies si/hi and verbally agrees to approach staff if these become apparent or before harming themselves/others while at [facility].  A: Pt provided support and encouragement. Pt given medication per protocol and standing orders. Q36m safety checks implemented and continued.  R: Pt safe on the unit. Will continue to monitor.

## 2021-01-31 LAB — BASIC METABOLIC PANEL
Anion gap: 7 (ref 5–15)
BUN: 19 mg/dL (ref 6–20)
CO2: 26 mmol/L (ref 22–32)
Calcium: 9.5 mg/dL (ref 8.9–10.3)
Chloride: 103 mmol/L (ref 98–111)
Creatinine, Ser: 1.16 mg/dL (ref 0.61–1.24)
GFR, Estimated: >60 mL/min (ref >60)
Glucose, Bld: 150 mg/dL — ABNORMAL HIGH (ref 70–99)
Potassium: 4 mmol/L (ref 3.5–5.1)
Sodium: 136 mmol/L (ref 135–145)

## 2021-01-31 LAB — LITHIUM LEVEL: Lithium Lvl: 0.66 mmol/L (ref 0.60–1.20)

## 2021-01-31 LAB — GLUCOSE, CAPILLARY
Glucose-Capillary: 141 mg/dL — ABNORMAL HIGH (ref 70–99)
Glucose-Capillary: 381 mg/dL — ABNORMAL HIGH (ref 70–99)
Glucose-Capillary: 158 mg/dL — ABNORMAL HIGH (ref 70–99)
Glucose-Capillary: 239 mg/dL — ABNORMAL HIGH (ref 70–99)

## 2021-01-31 MED ORDER — INSULIN ASPART 100 UNIT/ML IJ SOLN
10.0000 [IU] | Freq: Three times a day (TID) | INTRAMUSCULAR | Status: DC
  Administered 2021-01-31 – 2021-02-04 (×13): 10 [IU] via SUBCUTANEOUS

## 2021-01-31 MED ORDER — OLANZAPINE 5 MG PO TABS
5.0000 mg | ORAL_TABLET | Freq: Every day | ORAL | Status: DC
  Administered 2021-02-01 – 2021-02-02 (×2): 5 mg via ORAL
  Filled 2021-01-31 (×2): qty 1
  Filled 2021-01-31: qty 2
  Filled 2021-01-31: qty 1

## 2021-01-31 NOTE — Progress Notes (Signed)
Pt CBG = 141. Spoke with provider. Said to wait until patient eats breakfast and recheck CBG before giving SSI or meal coverage since blood sugar dropped so much overnight.

## 2021-01-31 NOTE — Progress Notes (Signed)
Pt didn't attend psycho-ed group.  

## 2021-01-31 NOTE — Progress Notes (Signed)
Adult Psychoeducational Group Note  Date:  01/31/2021 Time:  9:46 PM  Group Topic/Focus:  Wrap-Up Group:   The focus of this group is to help patients review their daily goal of treatment and discuss progress on daily workbooks.  Participation Level:  Minimal  Participation Quality:  Appropriate  Affect:  Anxious, Blunted, and Excited  Cognitive:  Disorganized, Confused, and Delusional  Insight: Lacking and Limited  Engagement in Group:  Limited and Poor  Modes of Intervention:  Discussion  Additional Comments:  Pt stated his goal for today was to focus on his treatment plan. Pt stated he accomplished his goal today. Pt stated he talked with his doctor and social worker about his care today. Pt rated his overall day a 10. Pt stated he made no calls today. Pt stated he felt better about himself today. Pt stated he was able to attend all meals. Pt stated he took all medications provided today. Pt stated his appetite was pretty good today. Pt rated sleep last night was pretty good. Pt stated the goal tonight was to get some rest. Pt stated he had no physical pain today. Pt deny visual hallucinations and auditory issues tonight. Pt denies thoughts of harming himself or others. Pt stated he would alert staff if anything changed.  Mitchell [name] 01/31/2021, 9:46 PM

## 2021-01-31 NOTE — Progress Notes (Signed)
Pt given 5 U SSI and 5 U standing per orders. Pt blood sugar will be checked at 2145 to reassess situation.

## 2021-01-31 NOTE — Progress Notes (Signed)
Pt didn't attend orientation/goals group. 

## 2021-01-31 NOTE — Progress Notes (Signed)
   01/30/21 2025  Psych Admission Type (Psych Patients Only)  Admission Status Involuntary  Psychosocial Assessment  Patient Complaints None  Eye Contact Avoids  Facial Expression Anxious  Affect Anxious;Preoccupied  Speech Logical/coherent  Interaction Assertive  Motor Activity Pacing  Appearance/Hygiene Unremarkable  Behavior Characteristics Cooperative;Anxious;Irritable  Mood Preoccupied;Irritable  Thought Process  Coherency Concrete thinking;Disorganized  Content Preoccupation;Paranoia  Delusions Paranoid;Persecutory  Perception UTA  Hallucination None reported or observed  Judgment Poor  Confusion UTA  Danger to Self  Current suicidal ideation? Denies  Danger to Others  Danger to Others None reported or observed   Pt seen pacing hallway at beginning of shift. Now sitting in dayroom. Pt refused to look at this writer during assessment. Pt irritable. Pt denies SI, HI. When asked about AVH, pt stated, "You tell me. Am I?" Pt denied pain, anxiety and depression. Pt agreed to take medication and denied any further complaints.

## 2021-01-31 NOTE — Progress Notes (Addendum)
[facility] MD Progress Note  01/31/2021 2:56 PM ABOUBACAR MATSUO  MRN:  [id number] Subjective:   Mitchell [name] is a 60 year old male with a past history of schizophrenia presenting under IVC for acute psychosis. He was exhibiting bizarre behavior (e.g. running through the streets naked).   Yesterday the following changes were made to the patient's regimen: No changes made   The patient's chart was reviewed and nursing notes were reviewed. Over the past 24 hrs, the patient received no PRNs for agitation. The patient's case was discussed in multidisciplinary team meeting.  On interview this morning, the patient appears psychotic, similar to yesterday.  He reports knowing one of the physicians well, despite never having met this person.  He then says "oh it must of been your twin sister".  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  He becomes angry and leaves the interview before further questions can be asked.  Principal Problem: Schizophrenia ([place]) Diagnosis: Principal Problem:   Schizophrenia ([place])  Total Time Spent in Direct Patient Care: I personally spent 30 minutes on the unit in direct patient care. The direct patient care time included face-to-face time with the patient, reviewing the patient's chart, communicating with other professionals, and coordinating care. Greater than 50% of this time was spent in counseling or coordinating care with the patient regarding goals of hospitalization, psycho-education, and discharge planning needs.   Past Psychiatric History: schizophrenia/schizoaffective disorder  Past Medical History:  Past Medical History:  Diagnosis Date   Bipolar affective disorder ([place])    takes Zyprexa daily   Diabetes mellitus    takes Victoza,Metformin,and Glipizide daily   Hypertension    takes Amlodipine,Lisinopril and Clonidine daily   Hyponatremia    history of   Mental disorder    takes Lithium daily   Schizoaffective disorder    takes Trazodone nightly    Seasonal allergies    takes Claritin daily   Sleep apnea    sleep study >4yr ago   Stroke ([facility]    left arm weakness   Past Surgical History:  Procedure Laterality Date   CATARACT EXTRACTION W/PHACO Right 02/14/2013   Procedure: CATARACT EXTRACTION PHACO AND INTRAOCULAR LENS PLACEMENT ([place];  Surgeon: GAdonis Brook MD;  [place]  Service: Ophthalmology;  Laterality: Right;   CATARACT EXTRACTION W/PHACO Left 06/13/2013   Procedure: CATARACT EXTRACTION PHACO AND INTRAOCULAR LENS PLACEMENT ([facility]);  Surgeon: GAdonis Brook MD;  [place]  Service: Ophthalmology;  Laterality: Left;   CIRCUMCISION  20 yrs. ago   EYE SURGERY     Family History: History reviewed. No pertinent family history. Family Psychiatric  History: reports an Hx of BPAD Social History:  Social History   Substance and Sexual Activity  Alcohol Use Not Currently     Social History   Substance and Sexual Activity  Drug Use No    Social History   Socioeconomic History   Marital status: Divorced    Spouse name: Not on file   Number of children: Not on file   Years of education: Not on file   Highest education level: Not on file  Occupational History   Not on file  Tobacco Use   Smoking status: Never   Smokeless tobacco: Never  Vaping Use   Vaping Use: Never used  Substance and Sexual Activity   Alcohol use: Not Currently   Drug use: No   Sexual activity: Yes    Birth control/protection: None  Other Topics Concern   Not on file  Social History Narrative   Not on file   Social Determinants of Health   Financial Resource Strain: Not on file  Food Insecurity: Not on file  Transportation Needs: Not on file  Physical Activity: Not on file  Stress: Not on file  Social Connections: Not on file   Additional Social History:     Sleep: Fair  Appetite:  Fair  Current Medications: Current Facility-Administered Medications  Medication Dose Route Frequency Provider Last Rate Last Admin    acetaminophen (TYLENOL) tablet 650 mg  650 mg Oral Q6H PRN Chalmers Guest, NP       alum & mag hydroxide-simeth (MAALOX/MYLANTA) 200-200-20 MG/5ML suspension 30 mL  30 mL Oral Q4H PRN Chalmers Guest, NP       amLODipine (NORVASC) tablet 10 mg  10 mg Oral Daily Chalmers Guest, NP   10 mg at 01/31/21 0940   aspirin EC tablet 81 mg  81 mg Oral Daily Chalmers Guest, NP   81 mg at 01/31/21 0940   benztropine (COGENTIN) tablet 1 mg  1 mg Oral BID Chalmers Guest, NP   1 mg at 01/31/21 0940   cloNIDine (CATAPRES) tablet 0.2 mg  0.2 mg Oral BID Chalmers Guest, NP   0.2 mg at 01/31/21 0940   glipiZIDE (GLUCOTROL XL) 24 hr tablet 10 mg  10 mg Oral Q breakfast Chalmers Guest, NP   10 mg at 01/31/21 0941   haloperidol (HALDOL) tablet 20 mg  20 mg Oral QHS Chalmers Guest, NP   20 mg at 01/30/21 2040   insulin aspart (novoLOG) injection 0-15 Units  0-15 Units Subcutaneous TID WC Maida Sale, MD   15 Units at 01/31/21 1307   insulin aspart (novoLOG) injection 0-5 Units  0-5 Units Subcutaneous QHS Chalmers Guest, NP   5 Units at 01/30/21 2114   insulin aspart (novoLOG) injection 10 Units  10 Units Subcutaneous TID WC Corky Sox, MD       insulin glargine-yfgn [facility]) injection 10 Units  10 Units Subcutaneous Daily Corky Sox, MD   10 Units at 01/31/21 0946   lisinopril (ZESTRIL) tablet 40 mg  40 mg Oral Daily Chalmers Guest, NP   40 mg at 01/31/21 0941   lithium carbonate (ESKALITH) CR tablet 450 mg  450 mg Oral Q12H Chalmers Guest, NP   450 mg at 01/31/21 0941   OLANZapine zydis (ZYPREXA) disintegrating tablet 10 mg  10 mg Oral Q8H PRN Chalmers Guest, NP   10 mg at 01/30/21 0401   And   LORazepam (ATIVAN) tablet 1 mg  1 mg Oral PRN Chalmers Guest, NP       And   ziprasidone (GEODON) injection 20 mg  20 mg Intramuscular PRN Chalmers Guest, NP       magnesium hydroxide (MILK OF MAGNESIA) suspension 30 mL  30 mL Oral Daily PRN Chalmers Guest, NP       traZODone (DESYREL) tablet 50 mg  50 mg  Oral QHS PRN Chalmers Guest, NP   50 mg at 01/29/21 2059    Lab Results:  Results for orders placed or performed during the hospital encounter of 01/28/21 (from the past 48 hour(s))  Glucose, capillary     Status: Abnormal   Collection Time: 01/29/21  5:09 PM  Result Value Ref Range   Glucose-Capillary 294 (H) 70 - 99 mg/dL    Comment: Glucose reference range applies only to samples taken  after fasting for at least 8 hours.  Glucose, capillary     Status: Abnormal   Collection Time: 01/29/21  8:25 PM  Result Value Ref Range   Glucose-Capillary 406 (H) 70 - 99 mg/dL    Comment: Glucose reference range applies only to samples taken after fasting for at least 8 hours.  Glucose, capillary     Status: Abnormal   Collection Time: 01/30/21  6:09 AM  Result Value Ref Range   Glucose-Capillary 229 (H) 70 - 99 mg/dL    Comment: Glucose reference range applies only to samples taken after fasting for at least 8 hours.  Lithium level     Status: None   Collection Time: 01/30/21  6:19 AM  Result Value Ref Range   Lithium Lvl 0.61 0.60 - 1.20 mmol/L    Comment: Performed at [facility], [place] [address], [place], [place] [address]  Basic metabolic panel     Status: Abnormal   Collection Time: 01/30/21  6:19 AM  Result Value Ref Range   Sodium 136 135 - 145 mmol/L   Potassium 4.2 3.5 - 5.1 mmol/L   Chloride 106 98 - 111 mmol/L   CO2 23 22 - 32 mmol/L   Glucose, Bld 235 (H) 70 - 99 mg/dL    Comment: Glucose reference range applies only to samples taken after fasting for at least 8 hours.   BUN 21 (H) 6 - 20 mg/dL   Creatinine, Ser 1.29 (H) 0.61 - 1.24 mg/dL   Calcium 9.3 8.9 - 10.3 mg/dL   GFR, Estimated >60 >60 mL/min    Comment: (NOTE) Calculated using the CKD-EPI Creatinine Equation (2021)    Anion gap 7 5 - 15    Comment: Performed at [facility], [place] [address], [place], [place] [address]  Lipid panel     Status: None   Collection Time: 01/30/21   6:19 AM  Result Value Ref Range   Cholesterol 140 0 - 200 mg/dL   Triglycerides 95 <150 mg/dL   HDL 49 >40 mg/dL   Total CHOL/HDL Ratio 2.9 RATIO   VLDL 19 0 - 40 mg/dL   LDL Cholesterol 72 0 - 99 mg/dL    Comment:        Total Cholesterol/HDL:CHD Risk Coronary Heart Disease Risk Table                     Men   Women  1/2 Average Risk   3.4   3.3  Average Risk       5.0   4.4  2 X Average Risk   9.6   7.1  3 X Average Risk  23.4   11.0        Use the calculated Patient Ratio above and the CHD Risk Table to determine the patient's CHD Risk.        ATP III CLASSIFICATION (LDL):  <100     mg/dL   Optimal  100-129  mg/dL   Near or Above                    Optimal  130-159  mg/dL   Borderline  160-189  mg/dL   High  >190     mg/dL   Very High Performed at [facility] [address], [place], [place] [address]   Glucose, capillary     Status: Abnormal   Collection Time: 01/30/21 11:48 AM  Result Value Ref Range  Glucose-Capillary 278 (H) 70 - 99 mg/dL    Comment: Glucose reference range applies only to samples taken after fasting for at least 8 hours.  Glucose, capillary     Status: Abnormal   Collection Time: 01/30/21  5:26 PM  Result Value Ref Range   Glucose-Capillary 456 (H) 70 - 99 mg/dL    Comment: Glucose reference range applies only to samples taken after fasting for at least 8 hours.  Glucose, capillary     Status: Abnormal   Collection Time: 01/30/21  7:11 PM  Result Value Ref Range   Glucose-Capillary 453 (H) 70 - 99 mg/dL    Comment: Glucose reference range applies only to samples taken after fasting for at least 8 hours.  Glucose, capillary     Status: Abnormal   Collection Time: 01/30/21  7:47 PM  Result Value Ref Range   Glucose-Capillary 500 (H) 70 - 99 mg/dL    Comment: Glucose reference range applies only to samples taken after fasting for at least 8 hours.  Glucose, capillary     Status: Abnormal   Collection Time: 01/30/21  9:43  PM  Result Value Ref Range   Glucose-Capillary 375 (H) 70 - 99 mg/dL    Comment: Glucose reference range applies only to samples taken after fasting for at least 8 hours.  Glucose, capillary     Status: Abnormal   Collection Time: 01/31/21  5:58 AM  Result Value Ref Range   Glucose-Capillary 141 (H) 70 - 99 mg/dL    Comment: Glucose reference range applies only to samples taken after fasting for at least 8 hours.  Basic metabolic panel     Status: Abnormal   Collection Time: 01/31/21  6:34 AM  Result Value Ref Range   Sodium 136 135 - 145 mmol/L   Potassium 4.0 3.5 - 5.1 mmol/L   Chloride 103 98 - 111 mmol/L   CO2 26 22 - 32 mmol/L   Glucose, Bld 150 (H) 70 - 99 mg/dL    Comment: Glucose reference range applies only to samples taken after fasting for at least 8 hours.   BUN 19 6 - 20 mg/dL   Creatinine, Ser 1.16 0.61 - 1.24 mg/dL   Calcium 9.5 8.9 - 10.3 mg/dL   GFR, Estimated >60 >60 mL/min    Comment: (NOTE) Calculated using the CKD-EPI Creatinine Equation (2021)    Anion gap 7 5 - 15    Comment: Performed at [facility], [place] [address], [place], [place] [address]  Lithium level     Status: None   Collection Time: 01/31/21  6:34 AM  Result Value Ref Range   Lithium Lvl 0.66 0.60 - 1.20 mmol/L    Comment: Performed at [facility], [place] [address], [place], [place] [address]  Glucose, capillary     Status: Abnormal   Collection Time: 01/31/21 12:38 PM  Result Value Ref Range   Glucose-Capillary 381 (H) 70 - 99 mg/dL    Comment: Glucose reference range applies only to samples taken after fasting for at least 8 hours.    Blood Alcohol level:  Lab Results  Component Value Date   ETH <10 01/26/2021   ETH <10 33/83/2919    Metabolic Disorder Labs: Lab Results  Component Value Date   HGBA1C 11.1 (H) 01/26/2021   MPG 271.87 01/26/2021   MPG 202.99 01/07/2019   No results found for: PROLACTIN Lab Results  Component Value Date    CHOL 140 01/30/2021  TRIG 95 01/30/2021   HDL 49 01/30/2021   CHOLHDL 2.9 01/30/2021   VLDL 19 01/30/2021   LDLCALC 72 01/30/2021   LDLCALC 67 05/20/2020    Physical Findings: AIMS: not yet assessed  Musculoskeletal: Strength & Muscle Tone: within normal limits Gait & Station: normal Patient leans: N/A  Psychiatric Specialty Exam:  Presentation  General Appearance: Bizarre; Disheveled  Eye Contact:Fleeting  Speech:Garbled; Slurred  Speech Volume:Normal  Handedness:Right   Mood and Affect  Mood: "great"  Affect: constricted  Thought Process  Thought Processes: Disorganized; Irrevelant  Descriptions of Associations:Tangential  Orientation:Full (Time, Place and Person)  Thought Content: frank delusions,    History of Schizophrenia/Schizoaffective disorder:Yes  Duration of Psychotic Symptoms:Greater than six months  Hallucinations:No data recorded  Ideas of Reference:Delusions  Suicidal Thoughts: none reported Homicidal Thoughts: none reported  Sensorium  Memory:Immediate Poor; Recent Poor; Remote Poor  Judgment:Poor  Insight:Poor   Executive Functions  Concentration:Poor  Attention Span:Poor  Recall:Poor  Fund of Knowledge:Poor  Language:Good   Psychomotor Activity  Psychomotor Activity:No data recorded   Assets  Assets:Financial Resources/Insurance; Physical Health; Resilience; Social Support   Sleep  Sleep:No data recorded    Physical Exam: Physical Exam Vitals reviewed.  Constitutional:      Appearance: He is not ill-appearing or toxic-appearing.  Pulmonary:     Effort: Pulmonary effort is normal.  Musculoskeletal:        General: Normal range of motion.  Neurological:     General: No focal deficit present.     Mental Status: He is alert.   Review of Systems  Respiratory:  Negative for shortness of breath.   Cardiovascular:  Negative for chest pain.  Gastrointestinal:  Negative for nausea and vomiting.  Blood  pressure 126/84, pulse 97, temperature 98.7 F (37.1 C), temperature source Oral, resp. rate 18, height 5' 3.5" (1.613 m), weight 91.6 kg, SpO2 100 %. Body mass index is 35.22 kg/m.  Assessment: Schizophrenia HTN T2DM, with insulin dependence   Schizophrenia, acute psychosis -Continue Haldol 20 mg QHS for psychosis, consider increase -Continue Cogentin 1 mg BID for EPS prevention -Consider LAI given lack of compliance and recurrent hospitalizations -Continue Lithium 450 mg BID for mood stability             -Li level of 0->0.7->0.6->0.66 over consecutive days   Agitation med protocol ordered: -Zydis 10 mg q8hr prn for agitation  -Ativan 1 mg once for anxiety and severe agitation -Geodon 20 mg IM once for agitation Will consider changing to haldol PRN   Continue PRN's: Tylenol, Maalox, Atarax, Milk of Magnesia, Trazodone   Medical Management Covid negative CMP: Cr of 1.45 CBC: unremarkable EtOH: <10 UDS: pending TSH: not collected A1C: 11.1 Lipids: pending   T2DM with insulin dependence Poorly controlled as evidenced by A1C. Home insulin regimen of 10U lantus daily and levemir 20U QHS (?) as well as Humalog 2-10U BID. Fasting CBG of 229 this AM with CBGs up to 400 yesterday.  -Continue 10U of glargine daily, fasting CBG of 140 this AM -Continue moderate SSI -Continue QHS correction -Increase to 10U pranidal coverage  -Continue glipizide -Assess for neuropathy and consider addition of gabapentin   HTN Continue home regimen -Lisinopril 40 mg daily -Amlodipine 10 mg daily -Clonidine 0.2 mg BID   Elevated Cr (resolved) Cr of 1.45 on a baseline of  1-1.2 at admission, likely pre-renal in the context of decreased PO. Cr down to 1.29 then normalized.    Corky Sox, MD 01/31/2021, 2:56 PM

## 2021-01-31 NOTE — Progress Notes (Signed)
Pt blood sugar elevated, per report from day shift RN. Last CBG at 1911 was 453. RN had contacted provider and was given verbal order to administer 5 U Novolog. Pt blood sugar checked by night shift tech. CBG = 500 at 1947. Contacted provider via [employer]. Was told to consult night shift NP. Night shift NP agreed to look over patient records and offer recommendations.

## 2021-01-31 NOTE — Progress Notes (Signed)
Pt CBG =375 at 2143. Provider notified. Pt is in bed in NAD.

## 2021-02-01 DIAGNOSIS — F209 Schizophrenia, unspecified: Secondary | ICD-10-CM | POA: Diagnosis not present

## 2021-02-01 LAB — GLUCOSE, CAPILLARY
Glucose-Capillary: 250 mg/dL — ABNORMAL HIGH (ref 70–99)
Glucose-Capillary: 248 mg/dL — ABNORMAL HIGH (ref 70–99)
Glucose-Capillary: 366 mg/dL — ABNORMAL HIGH (ref 70–99)
Glucose-Capillary: 395 mg/dL — ABNORMAL HIGH (ref 70–99)

## 2021-02-01 NOTE — [facility] Group Notes (Signed)
Adult Psychoeducational Group Not Date:  02/01/2021 Time:  0900-1045 Group Topic/Focus: PROGRESSIVE RELAXATION. A group where deep breathing is taught and tensing and relaxation muscle groups is used. Imagery is used as well.  Pts are asked to imagine 3 pillars that hold them up when they are not able to hold themselves up.  Participation Level:  Active  Participation Quality:  Appropriate  Affect:  Appropriate  Cognitive:  Oriented  Insight: Improving  Engagement in Group:  Engaged  Modes of Intervention:  Activity, Discussion, Education, and Support  Additional Comments:  pt states that he is at [facility] for "no reason" States what holds him up is [employer], , love for people in his church and [place] and happiness for everyone.  Dione Housekeeper

## 2021-02-01 NOTE — Progress Notes (Signed)
Adult Psychoeducational Group Note  Date:  02/01/2021 Time:  10:35 PM  Group Topic/Focus:  Wrap-Up Group:   The focus of this group is to help patients review their daily goal of treatment and discuss progress on daily workbooks.  Participation Level:  Did Not Attend  Participation Quality:   Did Not Attend  Affect:   Did Not Attend  Cognitive:   Did Not Attend  Insight: None  Engagement in Group:   Did Not Attend  Modes of Intervention:   Did Not Attend  Additional Comments:  Pt did not attend evening wrap up group tonight.  [name] 02/01/2021, 10:35 PM

## 2021-02-01 NOTE — Progress Notes (Signed)
D: Patient continues to exhibit psychotic behavior. He is observed speaking to himself loudly and incoherently. Patient has disorganized thinking and appears to be responding to voices. He is pleasant with staff. When he came up for his medications, he states, "I remember you." Patient has not needed any PRN medications today. He stays isolated to his room and comes out for his medications. Patient also stayed back for lunch. He denies any thoughts of self harm. He does appear to get agitated when his peers are loud in the cafeteria or in the milieu.   A: Continue to monitor medication management and MD orders.  Safety checks completed every 15 minutes per protocol.  Offer support and encouragement as needed.  R: Patient is isolative; he has not needed redirection from staff today.    02/01/21 1300  Psych Admission Type (Psych Patients Only)  Admission Status Involuntary  Psychosocial Assessment  Patient Complaints None  Eye Contact Avoids  Facial Expression Blank  Affect Preoccupied  Speech Incoherent;Pressured  Interaction Avoidant  Motor Activity Restless  Appearance/Hygiene Unremarkable  Behavior Characteristics Cooperative  Mood Preoccupied  Thought Process  Coherency Disorganized  Content Preoccupation  Delusions Paranoid  Perception Hallucinations  Hallucination None reported or observed  Judgment Impaired  Confusion Mild  Danger to Self  Current suicidal ideation? Denies  Danger to Others  Danger to Others None reported or observed

## 2021-02-01 NOTE — Progress Notes (Signed)
Pt didn't attend orientation/goals group. 

## 2021-02-01 NOTE — Progress Notes (Addendum)
[facility] MD Progress Note  02/01/2021 2:16 PM Mitchell [name]  MRN:  [id number] Subjective:  " I am doing fine and I have been here for 3 months, not going anywhere Reason for Hospitalization :Mitchell [name] is a 60 year old male with a past history of schizophrenia presenting under IVC for acute psychosis. He was exhibiting bizarre behavior (e.g. running through the streets naked).   Today's Note:  Records reviewed, report obtained and care was reviewed with members of our interdisciplinary team.  Patient was seen for today's evaluation and he was calm cooperative but disorganized.  Nursing staff reported AH and that patient is responding to internal stimuli.  Patient reported that he has been here for three months and when corrected that he has been here for three days he repeated that he has been here for 3 months and plans not to leave here.  Patient is compliant with his medications and eats well Actually patient has type 2 DM and he keeps eating more snacks than needed.  Nursing staff to monitor snacks patient is eating.  His Blood sugar ranges in the 200 plus with insulin coverage.  Patient have not required extra medications for agitation. Patient does not handle loud noise well he gets irritable.  We will continue to monitor.  SW will look into getting him connected to ACT TEAM.  He denied SI/HI but reported AH which patient denied. Principal Problem: Schizophrenia ([place]) Diagnosis: Principal Problem:   Schizophrenia ([place])  Total Time Spent in Direct Patient Care: 20 minutes  Past Psychiatric History: schizophrenia/schizoaffective disorder  Past Medical History:  Past Medical History:  Diagnosis Date   Bipolar affective disorder ([place])    takes Zyprexa daily   Diabetes mellitus    takes Victoza,Metformin,and Glipizide daily   Hypertension    takes Amlodipine,Lisinopril and Clonidine daily   Hyponatremia    history of   Mental disorder    takes Lithium daily   Schizoaffective disorder     takes Trazodone nightly   Seasonal allergies    takes Claritin daily   Sleep apnea    sleep study >5yrs ago   Stroke [facility])    left arm weakness   Past Surgical History:  Procedure Laterality Date   CATARACT EXTRACTION W/PHACO Right 02/14/2013   Procedure: CATARACT EXTRACTION PHACO AND INTRAOCULAR LENS PLACEMENT ([place]);  Surgeon: Adonis Brook, MD;  Location: [place];  Service: Ophthalmology;  Laterality: Right;   CATARACT EXTRACTION W/PHACO Left 06/13/2013   Procedure: CATARACT EXTRACTION PHACO AND INTRAOCULAR LENS PLACEMENT ([facility]);  Surgeon: Adonis Brook, MD;  Location: [place];  Service: Ophthalmology;  Laterality: Left;   CIRCUMCISION  20 yrs. ago   EYE SURGERY     Family History: History reviewed. No pertinent family history. Family Psychiatric  History: reports an Hx of BPAD Social History:  Social History   Substance and Sexual Activity  Alcohol Use Not Currently     Social History   Substance and Sexual Activity  Drug Use No    Social History   Socioeconomic History   Marital status: Divorced    Spouse name: Not on file   Number of children: Not on file   Years of education: Not on file   Highest education level: Not on file  Occupational History   Not on file  Tobacco Use   Smoking status: Never   Smokeless tobacco: Never  Vaping Use   Vaping Use: Never used  Substance and Sexual Activity   Alcohol use: Not Currently  Drug use: No   Sexual activity: Yes    Birth control/protection: None  Other Topics Concern   Not on file  Social History Narrative   Not on file   Social Determinants of Health   Financial Resource Strain: Not on file  Food Insecurity: Not on file  Transportation Needs: Not on file  Physical Activity: Not on file  Stress: Not on file  Social Connections: Not on file   Additional Social History:     Sleep: Poor  Appetite:  Fair  Current Medications: Current Facility-Administered Medications  Medication Dose Route Frequency  Provider Last Rate Last Admin   acetaminophen (TYLENOL) tablet 650 mg  650 mg Oral Q6H PRN Chalmers Guest, NP       alum & mag hydroxide-simeth (MAALOX/MYLANTA) 200-200-20 MG/5ML suspension 30 mL  30 mL Oral Q4H PRN Chalmers Guest, NP       amLODipine (NORVASC) tablet 10 mg  10 mg Oral Daily Chalmers Guest, NP   10 mg at 02/01/21 0758   aspirin EC tablet 81 mg  81 mg Oral Daily Chalmers Guest, NP   81 mg at 02/01/21 0758   benztropine (COGENTIN) tablet 1 mg  1 mg Oral BID Chalmers Guest, NP   1 mg at 02/01/21 0758   cloNIDine (CATAPRES) tablet 0.2 mg  0.2 mg Oral BID Chalmers Guest, NP   0.2 mg at 02/01/21 0758   glipiZIDE (GLUCOTROL XL) 24 hr tablet 10 mg  10 mg Oral Q breakfast Chalmers Guest, NP   10 mg at 02/01/21 0758   haloperidol (HALDOL) tablet 20 mg  20 mg Oral QHS Chalmers Guest, NP   20 mg at 01/31/21 2119   insulin aspart (novoLOG) injection 0-15 Units  0-15 Units Subcutaneous TID WC Hill, Jackie Plum, MD   8 Units at 02/01/21 1206   insulin aspart (novoLOG) injection 0-5 Units  0-5 Units Subcutaneous QHS Chalmers Guest, NP   2 Units at 01/31/21 2119   insulin aspart (novoLOG) injection 10 Units  10 Units Subcutaneous TID WC Corky Sox, MD   10 Units at 02/01/21 1207   insulin glargine-yfgn (SEMGLEE) injection 10 Units  10 Units Subcutaneous Daily Corky Sox, MD   10 Units at 02/01/21 0756   lisinopril (ZESTRIL) tablet 40 mg  40 mg Oral Daily Chalmers Guest, NP   40 mg at 02/01/21 0757   lithium carbonate (ESKALITH) CR tablet 450 mg  450 mg Oral Q12H Chalmers Guest, NP   450 mg at 02/01/21 0757   OLANZapine zydis (ZYPREXA) disintegrating tablet 10 mg  10 mg Oral Q8H PRN Chalmers Guest, NP   10 mg at 01/30/21 0401   And   LORazepam (ATIVAN) tablet 1 mg  1 mg Oral PRN Chalmers Guest, NP       And   ziprasidone (GEODON) injection 20 mg  20 mg Intramuscular PRN Chalmers Guest, NP       magnesium hydroxide (MILK OF MAGNESIA) suspension 30 mL  30 mL Oral Daily PRN Chalmers Guest, NP       OLANZapine (ZYPREXA) tablet 5 mg  5 mg Oral Daily Corky Sox, MD   5 mg at 02/01/21 0757   traZODone (DESYREL) tablet 50 mg  50 mg Oral QHS PRN Chalmers Guest, NP   50 mg at 01/31/21 2119    Lab Results:  Results for orders placed or performed during the hospital encounter of  01/28/21 (from the past 48 hour(s))  Glucose, capillary     Status: Abnormal   Collection Time: 01/30/21  5:26 PM  Result Value Ref Range   Glucose-Capillary 456 (H) 70 - 99 mg/dL    Comment: Glucose reference range applies only to samples taken after fasting for at least 8 hours.  Glucose, capillary     Status: Abnormal   Collection Time: 01/30/21  7:11 PM  Result Value Ref Range   Glucose-Capillary 453 (H) 70 - 99 mg/dL    Comment: Glucose reference range applies only to samples taken after fasting for at least 8 hours.  Glucose, capillary     Status: Abnormal   Collection Time: 01/30/21  7:47 PM  Result Value Ref Range   Glucose-Capillary 500 (H) 70 - 99 mg/dL    Comment: Glucose reference range applies only to samples taken after fasting for at least 8 hours.  Glucose, capillary     Status: Abnormal   Collection Time: 01/30/21  9:43 PM  Result Value Ref Range   Glucose-Capillary 375 (H) 70 - 99 mg/dL    Comment: Glucose reference range applies only to samples taken after fasting for at least 8 hours.  Glucose, capillary     Status: Abnormal   Collection Time: 01/31/21  5:58 AM  Result Value Ref Range   Glucose-Capillary 141 (H) 70 - 99 mg/dL    Comment: Glucose reference range applies only to samples taken after fasting for at least 8 hours.  Basic metabolic panel     Status: Abnormal   Collection Time: 01/31/21  6:34 AM  Result Value Ref Range   Sodium 136 135 - 145 mmol/L   Potassium 4.0 3.5 - 5.1 mmol/L   Chloride 103 98 - 111 mmol/L   CO2 26 22 - 32 mmol/L   Glucose, Bld 150 (H) 70 - 99 mg/dL    Comment: Glucose reference range applies only to samples taken after fasting for at  least 8 hours.   BUN 19 6 - 20 mg/dL   Creatinine, Ser 1.16 0.61 - 1.24 mg/dL   Calcium 9.5 8.9 - 10.3 mg/dL   GFR, Estimated >60 >60 mL/min    Comment: (NOTE) Calculated using the CKD-EPI Creatinine Equation (2021)    Anion gap 7 5 - 15    Comment: Performed at [facility], [place] [address] [place], [place] [address]  Lithium level     Status: None   Collection Time: 01/31/21  6:34 AM  Result Value Ref Range   Lithium Lvl 0.66 0.60 - 1.20 mmol/L    Comment: Performed at [facility], [place] [address], [place],  [address]  Glucose, capillary     Status: Abnormal   Collection Time: 01/31/21 12:38 PM  Result Value Ref Range   Glucose-Capillary 381 (H) 70 - 99 mg/dL    Comment: Glucose reference range applies only to samples taken after fasting for at least 8 hours.  Glucose, capillary     Status: Abnormal   Collection Time: 01/31/21  5:20 PM  Result Value Ref Range   Glucose-Capillary 158 (H) 70 - 99 mg/dL    Comment: Glucose reference range applies only to samples taken after fasting for at least 8 hours.  Glucose, capillary     Status: Abnormal   Collection Time: 01/31/21  7:44 PM  Result Value Ref Range   Glucose-Capillary 239 (H) 70 - 99 mg/dL    Comment: Glucose reference range applies only to samples  taken after fasting for at least 8 hours.  Glucose, capillary     Status: Abnormal   Collection Time: 02/01/21  5:17 AM  Result Value Ref Range   Glucose-Capillary 250 (H) 70 - 99 mg/dL    Comment: Glucose reference range applies only to samples taken after fasting for at least 8 hours.  Glucose, capillary     Status: Abnormal   Collection Time: 02/01/21 12:00 PM  Result Value Ref Range   Glucose-Capillary 248 (H) 70 - 99 mg/dL    Comment: Glucose reference range applies only to samples taken after fasting for at least 8 hours.    Blood Alcohol level:  Lab Results  Component Value Date   ETH <10 01/26/2021   ETH <10 01/21/2021     Metabolic Disorder Labs: Lab Results  Component Value Date   HGBA1C 11.1 (H) 01/26/2021   MPG 271.87 01/26/2021   MPG 202.99 01/07/2019   No results found for: PROLACTIN Lab Results  Component Value Date   CHOL 140 01/30/2021   TRIG 95 01/30/2021   HDL 49 01/30/2021   CHOLHDL 2.9 01/30/2021   VLDL 19 01/30/2021   LDLCALC 72 01/30/2021   LDLCALC 67 05/20/2020    Physical Findings: AIMS: not yet assessed  Musculoskeletal: Strength & Muscle Tone: within normal limits Gait & Station: normal Patient leans: N/A  Psychiatric Specialty Exam:  Presentation  General Appearance: Neat  Eye Contact:Fair; Fleeting  Speech:Blocked; Garbled  Speech Volume:Normal  Handedness:Right   Mood and Affect  Mood: "great"  Affect: constricted  Thought Process  Thought Processes: Disorganized; Irrevelant  Descriptions of Associations:Tangential  Orientation:Full (Time, Place and Person)  Thought Content: frank delusions,    History of Schizophrenia/Schizoaffective disorder:Yes  Duration of Psychotic Symptoms:Greater than six months  Hallucinations:Hallucinations: Auditory Description of Auditory Hallucinations: Per Nursing patient is responding to internal stimuli, talking out to unseen people.  Ideas of Reference:Delusions  Suicidal Thoughts: none reported Homicidal Thoughts: none reported  Sensorium  Memory:Immediate Poor; Recent Poor; Remote Poor  Judgment:Poor  Insight:Poor   Executive Functions  Concentration:Poor  Attention Span:Poor  Recall:Poor  Fund of Knowledge:Poor  Language:Good   Psychomotor Activity  Psychomotor Activity:Psychomotor Activity: Normal   Assets  Assets:Communication Skills; Social Support; Resilience   Sleep  Sleep:Sleep: Poor Number of Hours of Sleep: 4.5    Physical Exam: Physical Exam Vitals reviewed.  Constitutional:      Appearance: He is not ill-appearing or toxic-appearing.  Pulmonary:     Effort:  Pulmonary effort is normal.  Musculoskeletal:        General: Normal range of motion.  Neurological:     General: No focal deficit present.     Mental Status: He is alert.   Review of Systems  Respiratory:  Negative for shortness of breath.   Cardiovascular:  Negative for chest pain.  Gastrointestinal:  Negative for nausea and vomiting.  Blood pressure 128/78, pulse 91, temperature 99.1 F (37.3 C), temperature source Oral, resp. rate 16, height 5' 3.5" (1.613 m), weight 91.6 kg, SpO2 100 %. Body mass index is 35.22 kg/m.  Assessment: Schizophrenia HTN T2DM, with insulin dependence   Schizophrenia, acute psychosis -Continue Haldol 20 mg QHS for psychosis, consider increase -Continue Cogentin 1 mg BID for EPS prevention -Consider LAI given lack of compliance and recurrent hospitalizations -Continue Lithium 450 mg BID for mood stability             -Li level of 0->0.7->0.6->0.66 over consecutive days   Agitation med protocol ordered: -  Zydis 10 mg q8hr prn for agitation  -Ativan 1 mg once for anxiety and severe agitation -Geodon 20 mg IM once for agitation Will consider changing to haldol PRN   Continue PRN's: Tylenol, Maalox, Atarax, Milk of Magnesia, Trazodone   Medical Management Covid negative CMP: Cr of 1.16 CBC: unremarkable EtOH: <10 UDS: pending TSH: not collected A1C: 11.1 Lipids: pending   T2DM with insulin dependence Poorly controlled as evidenced by A1C. Home insulin regimen of 10U lantus daily and levemir 20U QHS (?) as well as Humalog 2-10U BID -Continue 10U of glargine daily. -Continue moderate SSI -Continue QHS correction -Continue 10U pranidal coverage  -Continue glipizide -Assess for neuropathy and consider addition of gabapentin   HTN Continue home regimen -Lisinopril 40 mg daily -Amlodipine 10 mg daily -Clonidine 0.2 mg BID   Elevated Cr (resolved) Latest Creat 1.16  Delfin Gant, NP-PMHNP-BC 02/01/2021, 2:16 PM

## 2021-02-01 NOTE — Progress Notes (Signed)
Pt didn't attend therapeutic relaxation group. 

## 2021-02-01 NOTE — Group Note (Signed)
LCSW Group Therapy Note   Group Date: 02/01/2021 Start Time: 1100 End Time: 1130   Type of Therapy and Topic:  Group Therapy: Boundaries  Participation Level:  Active  Description of Group: This group will address the use of boundaries in their personal lives. Patients will explore why boundaries are important, the difference between healthy and unhealthy boundaries, and negative and postive outcomes of different boundaries and will look at how boundaries can be crossed.  Patients will be encouraged to identify current boundaries in their own lives and identify what kind of boundary is being set. Facilitators will guide patients in utilizing problem-solving interventions to address and correct types boundaries being used and to address when no boundary is being used. Understanding and applying boundaries will be explored and addressed for obtaining and maintaining a balanced life. Patients will be encouraged to explore ways to assertively make their boundaries and needs known to significant others in their lives, using other group members and facilitator for role play, support, and feedback.  Therapeutic Goals:  1.  Patient will identify areas in their life where setting clear boundaries could be  used to improve their life.  2.  Patient will identify signs/triggers that a boundary is not being respected. 3.  Patient will identify two ways to set boundaries in order to achieve balance in  their lives: 4.  Patient will demonstrate ability to communicate their needs and set boundaries  through discussion and/or role plays  Summary of Patient Progress:  Due to the acuity and low staffing, group was not held on the adult unit today. Patients were provided therapeutic worksheets and asked to meet with CSW as needed.  Therapeutic Modalities:   Cognitive Behavioral Therapy Solution-Focused Therapy  Chrys Racer 02/01/2021  10:28 AM

## 2021-02-01 NOTE — Progress Notes (Signed)
Patient has been up in the dayroom tonight off and on. He spends time in his room and can be heard talking to himself and has been observed speaking to himself by the mht also. He is very pleasant but delusional. He was compliant with his medications also. Safety maintained with 15 min checks.

## 2021-02-02 DIAGNOSIS — F209 Schizophrenia, unspecified: Secondary | ICD-10-CM | POA: Diagnosis not present

## 2021-02-02 DIAGNOSIS — F23 Brief psychotic disorder: Secondary | ICD-10-CM | POA: Diagnosis not present

## 2021-02-02 LAB — GLUCOSE, CAPILLARY
Glucose-Capillary: 231 mg/dL — ABNORMAL HIGH (ref 70–99)
Glucose-Capillary: 246 mg/dL — ABNORMAL HIGH (ref 70–99)
Glucose-Capillary: 432 mg/dL — ABNORMAL HIGH (ref 70–99)
Glucose-Capillary: 450 mg/dL — ABNORMAL HIGH (ref 70–99)
Glucose-Capillary: 289 mg/dL — ABNORMAL HIGH (ref 70–99)

## 2021-02-02 MED ORDER — OLANZAPINE 5 MG PO TABS
5.0000 mg | ORAL_TABLET | Freq: Two times a day (BID) | ORAL | Status: DC
  Administered 2021-02-02 – 2021-02-05 (×7): 5 mg via ORAL
  Filled 2021-02-02 (×10): qty 1

## 2021-02-02 MED ORDER — INSULIN GLARGINE-YFGN 100 UNIT/ML ~~LOC~~ SOLN
17.0000 [IU] | Freq: Every day | SUBCUTANEOUS | Status: DC
  Administered 2021-02-02: 17 [IU] via SUBCUTANEOUS

## 2021-02-02 NOTE — Progress Notes (Signed)
[facility] MD Progress Note  02/02/2021 2:16 PM LARON BURGE  MRN:  [id number] Subjective:   Mitchell [name] is a 60 year old male with a past history of schizophrenia presenting under IVC for acute psychosis. He was exhibiting bizarre behavior (e.g. running through the streets naked).   Yesterday the following changes were made to the patient's regimen: -Add Zyprexa 5 mg daily  The patient's chart was reviewed and nursing notes were reviewed. Over the past 24 hrs, the patient received no PRNs for agitation. The patient's case was discussed in multidisciplinary team meeting. Per staff he is seen frequently responding to internal stimuli on the unit.   On interview this morning, the patient appears psychotic, similar to yesterday.  He is hostile during the interview and answers questions with an irritable tone in yes/no answers. When asked about his previously mentioned delusional beliefs that his last psychiatric hospital was blown up with a bomb, he endorses this and goes on a long rant. This was a notable departure from his previously laconic speech.  He ends the interview by closing the door on this interviewer.  Principal Problem: Schizophrenia ([place]) Diagnosis: Principal Problem:   Schizophrenia ([place])  Total Time Spent in Direct Patient Care: I personally spent 30 minutes on the unit in direct patient care. The direct patient care time included face-to-face time with the patient, reviewing the patient's chart, communicating with other professionals, and coordinating care. Greater than 50% of this time was spent in counseling or coordinating care with the patient regarding goals of hospitalization, psycho-education, and discharge planning needs.   Past Psychiatric History: schizophrenia/schizoaffective disorder  Past Medical History:  Past Medical History:  Diagnosis Date   Bipolar affective disorder ([place])    takes Zyprexa daily   Diabetes mellitus    takes Victoza,Metformin,and Glipizide  daily   Hypertension    takes Amlodipine,Lisinopril and Clonidine daily   Hyponatremia    history of   Mental disorder    takes Lithium daily   Schizoaffective disorder    takes Trazodone nightly   Seasonal allergies    takes Claritin daily   Sleep apnea    sleep study >20yrs ago   Stroke [facility])    left arm weakness   Past Surgical History:  Procedure Laterality Date   CATARACT EXTRACTION W/PHACO Right 02/14/2013   Procedure: CATARACT EXTRACTION PHACO AND INTRAOCULAR LENS PLACEMENT ([place]);  Surgeon: Adonis Brook, MD;  Location: [place];  Service: Ophthalmology;  Laterality: Right;   CATARACT EXTRACTION W/PHACO Left 06/13/2013   Procedure: CATARACT EXTRACTION PHACO AND INTRAOCULAR LENS PLACEMENT ([facility]);  Surgeon: Adonis Brook, MD;  Location: [place];  Service: Ophthalmology;  Laterality: Left;   CIRCUMCISION  20 yrs. ago   EYE SURGERY     Family History: History reviewed. No pertinent family history. Family Psychiatric  History: reports an Hx of BPAD Social History:  Social History   Substance and Sexual Activity  Alcohol Use Not Currently     Social History   Substance and Sexual Activity  Drug Use No    Social History   Socioeconomic History   Marital status: Divorced    Spouse name: Not on file   Number of children: Not on file   Years of education: Not on file   Highest education level: Not on file  Occupational History   Not on file  Tobacco Use   Smoking status: Never   Smokeless tobacco: Never  Vaping Use   Vaping Use: Never used  Substance and Sexual Activity  Alcohol use: Not Currently   Drug use: No   Sexual activity: Yes    Birth control/protection: None  Other Topics Concern   Not on file  Social History Narrative   Not on file   Social Determinants of Health   Financial Resource Strain: Not on file  Food Insecurity: Not on file  Transportation Needs: Not on file  Physical Activity: Not on file  Stress: Not on file  Social Connections: Not on  file   Additional Social History:     Sleep: Fair  Appetite:  Fair  Current Medications: Current Facility-Administered Medications  Medication Dose Route Frequency Provider Last Rate Last Admin   acetaminophen (TYLENOL) tablet 650 mg  650 mg Oral Q6H PRN Chalmers Guest, NP       alum & mag hydroxide-simeth (MAALOX/MYLANTA) 200-200-20 MG/5ML suspension 30 mL  30 mL Oral Q4H PRN Chalmers Guest, NP       amLODipine (NORVASC) tablet 10 mg  10 mg Oral Daily Chalmers Guest, NP   10 mg at 02/02/21 [id number]   aspirin EC tablet 81 mg  81 mg Oral Daily Chalmers Guest, NP   81 mg at 02/02/21 0824   benztropine (COGENTIN) tablet 1 mg  1 mg Oral BID Chalmers Guest, NP   1 mg at 02/02/21 [id number]   cloNIDine (CATAPRES) tablet 0.2 mg  0.2 mg Oral BID Chalmers Guest, NP   0.2 mg at 02/02/21 [id number]   glipiZIDE (GLUCOTROL XL) 24 hr tablet 10 mg  10 mg Oral Q breakfast Chalmers Guest, NP   10 mg at 02/02/21 [id number]   haloperidol (HALDOL) tablet 20 mg  20 mg Oral QHS Chalmers Guest, NP   20 mg at 02/01/21 2022   insulin aspart (novoLOG) injection 0-15 Units  0-15 Units Subcutaneous TID WC Hill, Jackie Plum, MD   5 Units at 02/02/21 1314   insulin aspart (novoLOG) injection 0-5 Units  0-5 Units Subcutaneous QHS Chalmers Guest, NP   5 Units at 02/01/21 2022   insulin aspart (novoLOG) injection 10 Units  10 Units Subcutaneous TID WC Corky Sox, MD   10 Units at 02/02/21 1314   insulin glargine-yfgn (SEMGLEE) injection 17 Units  17 Units Subcutaneous Daily Corky Sox, MD   17 Units at 02/02/21 0826   lisinopril (ZESTRIL) tablet 40 mg  40 mg Oral Daily Chalmers Guest, NP   40 mg at 02/02/21 0823   lithium carbonate (ESKALITH) CR tablet 450 mg  450 mg Oral Q12H Chalmers Guest, NP   450 mg at 02/02/21 0824   OLANZapine zydis (ZYPREXA) disintegrating tablet 10 mg  10 mg Oral Q8H PRN Chalmers Guest, NP   10 mg at 01/30/21 0401   And   LORazepam (ATIVAN) tablet 1 mg  1 mg Oral PRN Chalmers Guest, NP       And    ziprasidone (GEODON) injection 20 mg  20 mg Intramuscular PRN Chalmers Guest, NP       magnesium hydroxide (MILK OF MAGNESIA) suspension 30 mL  30 mL Oral Daily PRN Chalmers Guest, NP       OLANZapine (ZYPREXA) tablet 5 mg  5 mg Oral BID Corky Sox, MD       traZODone (DESYREL) tablet 50 mg  50 mg Oral QHS PRN Chalmers Guest, NP   50 mg at 02/02/21 0107    Lab Results:  Results for orders placed or performed during the  hospital encounter of 01/28/21 (from the past 48 hour(s))  Glucose, capillary     Status: Abnormal   Collection Time: 01/31/21  5:20 PM  Result Value Ref Range   Glucose-Capillary 158 (H) 70 - 99 mg/dL    Comment: Glucose reference range applies only to samples taken after fasting for at least 8 hours.  Glucose, capillary     Status: Abnormal   Collection Time: 01/31/21  7:44 PM  Result Value Ref Range   Glucose-Capillary 239 (H) 70 - 99 mg/dL    Comment: Glucose reference range applies only to samples taken after fasting for at least 8 hours.  Glucose, capillary     Status: Abnormal   Collection Time: 02/01/21  5:17 AM  Result Value Ref Range   Glucose-Capillary 250 (H) 70 - 99 mg/dL    Comment: Glucose reference range applies only to samples taken after fasting for at least 8 hours.  Glucose, capillary     Status: Abnormal   Collection Time: 02/01/21 12:00 PM  Result Value Ref Range   Glucose-Capillary 248 (H) 70 - 99 mg/dL    Comment: Glucose reference range applies only to samples taken after fasting for at least 8 hours.  Glucose, capillary     Status: Abnormal   Collection Time: 02/01/21  5:32 PM  Result Value Ref Range   Glucose-Capillary 366 (H) 70 - 99 mg/dL    Comment: Glucose reference range applies only to samples taken after fasting for at least 8 hours.  Glucose, capillary     Status: Abnormal   Collection Time: 02/01/21  7:34 PM  Result Value Ref Range   Glucose-Capillary 395 (H) 70 - 99 mg/dL    Comment: Glucose reference range applies only to  samples taken after fasting for at least 8 hours.  Glucose, capillary     Status: Abnormal   Collection Time: 02/02/21  5:58 AM  Result Value Ref Range   Glucose-Capillary 231 (H) 70 - 99 mg/dL    Comment: Glucose reference range applies only to samples taken after fasting for at least 8 hours.  Glucose, capillary     Status: Abnormal   Collection Time: 02/02/21 12:19 PM  Result Value Ref Range   Glucose-Capillary 246 (H) 70 - 99 mg/dL    Comment: Glucose reference range applies only to samples taken after fasting for at least 8 hours.    Blood Alcohol level:  Lab Results  Component Value Date   ETH <10 01/26/2021   ETH <10 [id number]    Metabolic Disorder Labs: Lab Results  Component Value Date   HGBA1C 11.1 (H) 01/26/2021   MPG 271.87 01/26/2021   MPG 202.99 01/07/2019   No results found for: PROLACTIN Lab Results  Component Value Date   CHOL 140 01/30/2021   TRIG 95 01/30/2021   HDL 49 01/30/2021   CHOLHDL 2.9 01/30/2021   VLDL 19 01/30/2021   LDLCALC 72 01/30/2021   LDLCALC 67 05/20/2020    Physical Findings: AIMS: not yet assessed  Musculoskeletal: Strength & Muscle Tone: within normal limits Gait & Station: normal Patient leans: N/A  Psychiatric Specialty Exam:  Presentation  General Appearance: appropriate for environment Eye Contact:Fair; Fleeting  Speech:Blocked; Garbled  Speech Volume:Normal  Handedness:Right   Mood and Affect  Mood: "great"  Affect: constricted  Thought Process  Thought Processes: Disorganized; Irrevelant  Descriptions of Associations:Tangential  Orientation:Full (Time, Place and Person)  Thought Content: frank delusions    History of Schizophrenia/Schizoaffective disorder:Yes  Duration of Psychotic  Symptoms:Greater than six months  Hallucinations:Hallucinations: Auditory Description of Auditory Hallucinations: Per Nursing patient is responding to internal stimuli, talking out to unseen people.  Ideas of  Reference:Delusions  Suicidal Thoughts: none reported Homicidal Thoughts: none reported  Sensorium  Memory:Immediate Poor; Recent Poor; Remote Poor  Judgment:Poor  Insight:Poor   Executive Functions  Concentration:Poor  Attention Span:Poor  Recall:Poor  Fund of Knowledge:Poor  Language:Good   Psychomotor Activity  Psychomotor Activity:Psychomotor Activity: Normal   Assets  Assets:Communication Skills; Social Support; Resilience   Sleep  Sleep:Sleep: Poor Number of Hours of Sleep: 4.5    Physical Exam: Physical Exam Vitals reviewed.  Constitutional:      Appearance: He is not ill-appearing or toxic-appearing.  Pulmonary:     Effort: Pulmonary effort is normal.  Musculoskeletal:        General: Normal range of motion.  Neurological:     General: No focal deficit present.     Mental Status: He is alert.   Review of Systems  Respiratory:  Negative for shortness of breath.   Cardiovascular:  Negative for chest pain.  Gastrointestinal:  Negative for nausea and vomiting.  Blood pressure 126/79, pulse 95, temperature 99.3 F (37.4 C), temperature source Oral, resp. rate 18, height 5' 3.5" (1.613 m), weight 91.6 kg, SpO2 98 %. Body mass index is 35.22 kg/m.  Assessment: Schizophrenia HTN T2DM, with insulin dependence   Schizophrenia, acute psychosis -Continue Haldol 20 mg QHS for psychosis, consider increase -Increase Zyprexa to 5 mg BID for intractable psychosis -Continue Cogentin 1 mg BID for EPS prevention -Consider LAI given lack of compliance and recurrent hospitalizations -Continue Lithium 450 mg BID for mood stability             -Li level of 0->0.7->0.6->0.66 over consecutive days   Agitation med protocol ordered: -Zydis 10 mg q8hr prn for agitation  -Ativan 1 mg once for anxiety and severe agitation -Geodon 20 mg IM once for agitation Will consider changing to haldol PRN   Continue PRN's: Tylenol, Maalox, Atarax, Milk of Magnesia,  Trazodone   Medical Management Covid negative CMP: Cr of 1.45 CBC: unremarkable EtOH: <10 UDS: pending TSH: not collected A1C: 11.1 Lipids: pending   T2DM with insulin dependence Poorly controlled as evidenced by A1C. Home insulin regimen of 10U lantus daily and levemir 20U QHS (?) as well as Humalog 2-10U BID. Fasting CBG of 231 this AM with range up to 395 over the past 24 hrs.  -Increase to 17U of glargine daily -Continue moderate SSI -Continue QHS correction -Continue 10U pranidal coverage  -Continue glipizide   HTN Continue home regimen -Lisinopril 40 mg daily -Amlodipine 10 mg daily -Clonidine 0.2 mg BID   Elevated Cr (resolved) Cr of 1.45 on a baseline of  1-1.2 at admission, likely pre-renal in the context of decreased PO. Cr down to 1.29 then normalized.    Carlyn Reichert, MD 02/02/2021, 2:16 PM

## 2021-02-02 NOTE — Progress Notes (Signed)
   02/02/21 2044  Psych Admission Type (Psych Patients Only)  Admission Status Involuntary  Psychosocial Assessment  Patient Complaints None  Eye Contact Avoids  Facial Expression Anxious  Affect Preoccupied  Speech Logical/coherent  Interaction Minimal  Motor Activity Restless;Pacing  Appearance/Hygiene Unremarkable  Behavior Characteristics Cooperative;Irritable  Mood Preoccupied  Thought Process  Coherency Disorganized  Content Preoccupation  Delusions Paranoid  Perception Hallucinations  Hallucination None reported or observed  Judgment Impaired  Confusion UTA  Danger to Self  Current suicidal ideation? Denies  Danger to Others  Danger to Others None reported or observed   Pt denies SI, HI, AVH and pain. Pt denies anxiety and depression. Pt pacing in halls and responding to internal stimuli. Pt has avertive gaze and is irritable. Pt takes medications as prescribed. Pt blood sugar still elevated in the 400's after dinner as reported by day shift. Pt CBG this evening was 289. Pt given 3 U Novolog.

## 2021-02-02 NOTE — Group Note (Signed)
Recreation Therapy Group Note   Group Topic:Coping Skills  Group Date: 02/02/2021 Start Time: 1000 End Time: 1030 Facilitators: Caroll Rancher, LRT/CTRS Location: 500 Hall Dayroom  Goal Area(s) Addresses:  Patients will identify positive coping skills. Patients will identify benefit of using positive coping skills. Patients will identify positive coping skills to use post d/c.  Group Description: Mind Map.  Patients and LRT filled out the first 8 boxes of the mind map together with anger, mad, stress, gittery, hypomanic, thrifty and two others.  Patients were given 15 minutes to break off and identify at least three coping skills for each situation.  The group would come back together and LRT will right coping skills on the board.        Affect/Mood: Flat   Participation Level: Non-verbal   Participation Quality: Independent   Behavior: Hallucinating   Speech/Thought Process: Delusional   Insight: Lacking   Judgement: Lacking    Modes of Intervention: Worksheet   Patient Response to Interventions:  Attentive   Education Outcome:  Acknowledges education and In group clarification offered    Clinical Observations/Individualized Feedback: Pt did not complete the mind map.  Pt just wrote various amounts of money on the map.  Pt was interacting with someone not visible.      Plan: Continue to engage patient in RT group sessions 2-3x/week.   Caroll Rancher, LRT/CTRS 02/02/2021 1:54 PM

## 2021-02-02 NOTE — Group Note (Signed)
  Type of Therapy and Topic:  Group Therapy:  Healthy and Unhealthy Supports  Participation Level:  Minimal   Description of Group:  Patients in this group were introduced to the idea of adding a variety of healthy supports to address the various needs in their lives.Patients discussed what additional healthy supports could be helpful in their recovery and wellness after discharge in order to prevent future hospitalizations.   An emphasis was placed on using counselor, doctor, therapy groups, 12-step groups, and problem-specific support groups to expand supports.  They also worked as a group on developing a specific plan for several patients to deal with unhealthy supports through boundary-setting, psychoeducation with loved ones, and even termination of relationships.   Therapeutic Goals:   1)  discuss importance of adding supports to stay well once out of the hospital  2)  compare healthy versus unhealthy supports and identify some examples of each  3)  generate ideas and descriptions of healthy supports that can be added  4)  offer mutual support about how to address unhealthy supports  5)  encourage active participation in and adherence to discharge plan    Summary of Patient Progress:  Patient was present in the 2nd half of group. Patient shared that his woman in his life was supportive. He shared the values and characteristics that made her supportive.    Therapeutic Modalities:   Motivational Interviewing Brief Solution-Focused Therapy  Beatris Si, LCSW 02/02/2021  2:08 PM

## 2021-02-02 NOTE — Progress Notes (Signed)
CBG elevated this evening, was 432 at 1833 post meal as pt refused his 1700 CBG check on initial approach. Received a total of 25 unit of insulin for both scheduled (10 units) and sliding scale (15 units). CBG rechecked at approximately 1915 and was 450 mg /dl at the time. Will notify oncoming shift.  Noted with irritability and some resistance when told about meal supervision order "I know my damn rights and what to do. I've been a diabetic for most of my life. Y'all can't tell me what the hell to do".  Pt visible in scheduled groups, participated minimally with prompts. Remains disorganized, actively responding to internal stimuli. Denies SI, HI, AVH and pain when assessed. Reports he slept well with good appetite. Remains safe on and off unit. Denies concerns at this time.

## 2021-02-02 NOTE — Progress Notes (Signed)
Adult Psychoeducational Group Note  Date:  02/02/2021 Time:  9:04 PM  Group Topic/Focus:  Wrap-Up Group:   The focus of this group is to help patients review their daily goal of treatment and discuss progress on daily workbooks.  Participation Level:  Did Not Attend  Participation Quality:   Did Not Attend  Affect:   Did Not Attend  Cognitive:   Did Not Attend  Insight: None  Engagement in Group:   Did Not Attend  Modes of Intervention:   Did Not Attend  Additional Comments:  Pt did not attend evening wrap up group tonight.  [name] 02/02/2021, 9:04 PM

## 2021-02-03 DIAGNOSIS — F23 Brief psychotic disorder: Secondary | ICD-10-CM | POA: Diagnosis not present

## 2021-02-03 DIAGNOSIS — F209 Schizophrenia, unspecified: Secondary | ICD-10-CM | POA: Diagnosis not present

## 2021-02-03 LAB — GLUCOSE, CAPILLARY
Glucose-Capillary: 213 mg/dL — ABNORMAL HIGH (ref 70–99)
Glucose-Capillary: 461 mg/dL — ABNORMAL HIGH (ref 70–99)
Glucose-Capillary: 163 mg/dL — ABNORMAL HIGH (ref 70–99)
Glucose-Capillary: 318 mg/dL — ABNORMAL HIGH (ref 70–99)

## 2021-02-03 MED ORDER — INSULIN GLARGINE-YFGN 100 UNIT/ML ~~LOC~~ SOLN
22.0000 [IU] | Freq: Every day | SUBCUTANEOUS | Status: DC
  Administered 2021-02-03: 22 [IU] via SUBCUTANEOUS

## 2021-02-03 NOTE — Progress Notes (Signed)
Inpatient Diabetes Program Recommendations  AACE/ADA: New Consensus Statement on Inpatient Glycemic Control (2015)  Target Ranges:  Prepandial:   less than 140 mg/dL      Peak postprandial:   less than 180 mg/dL (1-2 hours)      Critically ill patients:  140 - 180 mg/dL   Lab Results  Component Value Date   GLUCAP 213 (H) 02/03/2021   HGBA1C 11.1 (H) 01/26/2021    Review of Glycemic Control  Diabetes history: DM2 Outpatient Diabetes medications: Semglee 10 units QD, Novolog 10 units TID, glipizide 10 mg QAM Current orders for Inpatient glycemic control: Semglee 17 units QD, Novolog 0-15 units TID with meals and 0-5 HS + 10 units TID, glipizide 10 mg QAM  HgbA1C 11.1%  Inpatient Diabetes Program Recommendations:    Increase Semglee to 20 units QD  Will need to f/u with PCP regarding her HgbA1C of 11.1%.  Continue to follow.  Thank you. Ailene Ards, RD, LDN, CDE Inpatient Diabetes Coordinator [phone]

## 2021-02-03 NOTE — Progress Notes (Signed)
Thai was in the day room much of the evening.  He was drowsy and falling asleep in the chair.  He did get up to take bedtime medications and obtain snack.  He continues to respond to internal stimuli.  HS blood sugar was 318 and 4 units of Novolog given.  He was noted coming in and out of his room arguing with staff becoming more agitated, intrusive, paranoid and talking loudly disrupting the milieu.  PRN Zyprexa given with some relief.  He continues to be up and down at times throughout the night but remains in his room.  Q 15 minute checks maintained for safety.       02/03/21 2058  Psych Admission Type (Psych Patients Only)  Admission Status Involuntary  Psychosocial Assessment  Patient Complaints None  Eye Contact Avoids  Facial Expression Blank  Affect Preoccupied  Speech Logical/coherent  Interaction Minimal;No initiation  Motor Activity Slow;Shuffling  Appearance/Hygiene Unremarkable  Behavior Characteristics Cooperative  Mood Preoccupied  Thought Process  Coherency Disorganized  Content Preoccupation  Delusions Paranoid  Perception Hallucinations  Hallucination None reported or observed  Judgment Impaired  Confusion Mild  Danger to Self  Current suicidal ideation? Denies  Danger to Others  Danger to Others None reported or observed

## 2021-02-03 NOTE — Group Note (Signed)
Recreation Therapy Group Note   Group Topic:Communication  Group Date: 02/03/2021 Start Time: 0950 End Time: 1030 Facilitators: Caroll Rancher, LRT/CTRS Location: [address] Dayroom   Goal Area(s) Addresses:  Patient will effectively listen to complete activity.  Patient will identify communication skills used to make activity successful.  Patient will identify how skills used during activity can be used to reach post d/c goals.     Group Description: Geometric Drawings.  Three volunteers from the peer group will be shown an abstract picture with a particular arrangement of geometrical shapes.  Each round, one 'speaker' will describe the pattern, as accurately as possible without revealing the image to the group.  The remaining group members will listen and draw the picture to reflect how it is described to them. Patients with the role of 'listener' cannot ask clarifying questions but, may request that the speaker repeat a direction. Once the drawings are complete, the presenter will show the rest of the group the picture and compare how close each person came to drawing the picture. LRT will facilitate a post-activity discussion regarding effective communication and the importance of planning, listening, and asking for clarification in daily interactions with others.   Affect/Mood: Appropriate   Participation Level: Minimal   Participation Quality: Independent   Behavior: Hallucinating   Speech/Thought Process: Delusional   Insight: Impaired   Judgement: Impaired   Modes of Intervention: Art   Patient Response to Interventions:  Attentive   Education Outcome:  Acknowledges education and In group clarification offered    Clinical Observations/Individualized Feedback: Pt attempted to complete activity.  Pt was also talking to someone not there and singing to himself.  Pt sat and listened before falling asleep in his chair.   Plan: Continue to engage patient in RT group  sessions 2-3x/week.   Caroll Rancher, LRT/CTRS 02/03/2021 12:33 PM

## 2021-02-03 NOTE — [facility] Group Notes (Signed)
Pt attended goals ground and contributed
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
## 2021-02-03 NOTE — [facility] Group Notes (Signed)
Patient attended and contributed to relaxation group

## 2021-02-04 DIAGNOSIS — F209 Schizophrenia, unspecified: Secondary | ICD-10-CM | POA: Diagnosis not present

## 2021-02-04 DIAGNOSIS — F23 Brief psychotic disorder: Secondary | ICD-10-CM | POA: Diagnosis not present

## 2021-02-04 LAB — BASIC METABOLIC PANEL
Anion gap: 6 (ref 5–15)
BUN: 17 mg/dL (ref 6–20)
CO2: 29 mmol/L (ref 22–32)
Calcium: 9.5 mg/dL (ref 8.9–10.3)
Chloride: 101 mmol/L (ref 98–111)
Creatinine, Ser: 1.11 mg/dL (ref 0.61–1.24)
GFR, Estimated: >60 mL/min (ref >60)
Glucose, Bld: 217 mg/dL — ABNORMAL HIGH (ref 70–99)
Potassium: 4.1 mmol/L (ref 3.5–5.1)
Sodium: 136 mmol/L (ref 135–145)

## 2021-02-04 LAB — GLUCOSE, CAPILLARY
Glucose-Capillary: 214 mg/dL — ABNORMAL HIGH (ref 70–99)
Glucose-Capillary: 367 mg/dL — ABNORMAL HIGH (ref 70–99)
Glucose-Capillary: 297 mg/dL — ABNORMAL HIGH (ref 70–99)
Glucose-Capillary: 377 mg/dL — ABNORMAL HIGH (ref 70–99)

## 2021-02-04 LAB — LITHIUM LEVEL: Lithium Lvl: 0.68 mmol/L (ref 0.60–1.20)

## 2021-02-04 MED ORDER — INSULIN GLARGINE-YFGN 100 UNIT/ML ~~LOC~~ SOLN
27.0000 [IU] | Freq: Every day | SUBCUTANEOUS | Status: DC
Start: 2021-02-04 — End: 2021-02-05
  Administered 2021-02-04: 27 [IU] via SUBCUTANEOUS

## 2021-02-04 NOTE — Progress Notes (Signed)
Pt standing at end of hall outside his room, yelling and arguing with himself. This Clinical research associate spoke with pt. "They didn't tell me it was time to eat. As soon as I got up and went to my room, they let everybody else go." Reoriented pt to time of day and that it is not meal time yet. "He changed the clock. Did you see that? He did. And that girl down the hall? She's my dead daughter. They killed her but there she is. I'm not giving her any money." Offered to let pt sit in dayroom for last 15 minutes of open time. Pt agreed. Sat down and watched television.

## 2021-02-04 NOTE — [facility] Group Notes (Signed)
Adult Psychoeducational Group Note  Date:  02/04/2021 Time:  10:17 AM  Group Topic/Focus:  Goals Group:   The focus of this group is to help patients establish daily goals to achieve during treatment and discuss how the patient can incorporate goal setting into their daily lives to aide in recovery.  Participation Level:  Did Not Attend   Mitchell [name] 02/04/2021, 10:17 AM

## 2021-02-04 NOTE — [facility] Group Notes (Signed)
Topic: Grounding Techniques   Due to the acuity on the unit, group was not held. Patient was provided therapeutic worksheets and asked to meet with CSW as needed.          Ruthann Cancer MSW, LCSW Clincal Social Worker  [facility]

## 2021-02-04 NOTE — Progress Notes (Signed)
   02/04/21 2015  Psych Admission Type (Psych Patients Only)  Admission Status Involuntary  Psychosocial Assessment  Patient Complaints None  Eye Contact Avoids  Facial Expression Animated;Anxious  Affect Preoccupied  Speech Tangential  Interaction Minimal  Motor Activity Slow;Shuffling  Appearance/Hygiene Disheveled  Behavior Characteristics Cooperative;Irritable  Mood Preoccupied  Thought Process  Coherency Disorganized;Tangential  Content Preoccupation  Delusions Paranoid  Perception Derealization  Hallucination None reported or observed  Judgment Impaired  Confusion Moderate  Danger to Self  Current suicidal ideation? Denies  Danger to Others  Danger to Others None reported or observed   Pt seen standing in the hall outside the dayroom just staring. Pt denies SI, HI, AVH and pain. Pt denies anxiety and depression. Pt appears agitated with his responses. Pt tangential and responding. Redirectable at this time. Pt paces the hallway. Takes medications as prescribed. CBG = 377 this evening. Given 5 U Novolog.

## 2021-02-04 NOTE — [facility] Group Notes (Signed)
[facility] Group Notes:  (Nursing/MHT/Case Management/Adjunct)  Date:  02/04/2021  Time:  8:44 PM  Type of Therapy:  Psychoeducational Skills  Participation Level:  None  Participation Quality:  Resistant  Affect:  Resistant  Cognitive:  Lacking  Insight:  None  Engagement in Group:  None  Modes of Intervention:  Education  Summary of Progress/Problems: The patient did not attend group this evening.   Hazle Coca S 02/04/2021, 8:44 PM

## 2021-02-04 NOTE — Progress Notes (Signed)
[facility] MD Progress Note  02/04/2021 6:06 PM Mitchell [name]  MRN:  [id number] Subjective:   Mitchell [name] is a 60 year old male with a past history of schizophrenia presenting under IVC for acute psychosis. He was exhibiting bizarre behavior (e.g. running through the streets naked).   Yesterday the following changes were made to the patient's regimen: No changes made  The patient's chart was reviewed and nursing notes were reviewed. Over the past 24 hrs, the patient received PRN Zyprexa 10 mg PO x2 for agitation as well as Ativan 1 mg PRN for agitation. Per staff the patient was talking with unseen others, was intrusive, paranoid, and yelling. The patient's case was discussed in multidisciplinary team meeting.   On interview this morning, the patient exhibits delusions and disorganized thought process similar to previous days.  He reports that his mood is "good".  When asked to the president is he reports "me" and when asked what building he is in he reports "[place], [place]".  He is unable to answer orientation questions correctly, stating that it is the 23rd of December, 2003.  He denies experiencing auditory/visual hallucinations and denies suicidal thoughts.  Principal Problem: Schizophrenia ([place]) Diagnosis: Principal Problem:   Schizophrenia ([place])  Total Time Spent in Direct Patient Care: I personally spent 30 minutes on the unit in direct patient care. The direct patient care time included face-to-face time with the patient, reviewing the patient's chart, communicating with other professionals, and coordinating care. Greater than 50% of this time was spent in counseling or coordinating care with the patient regarding goals of hospitalization, psycho-education, and discharge planning needs.   Past Psychiatric History: schizophrenia/schizoaffective disorder  Past Medical History:  Past Medical History:  Diagnosis Date   Bipolar affective disorder ([place])    takes Zyprexa daily   Diabetes  mellitus    takes Victoza,Metformin,and Glipizide daily   Hypertension    takes Amlodipine,Lisinopril and Clonidine daily   Hyponatremia    history of   Mental disorder    takes Lithium daily   Schizoaffective disorder    takes Trazodone nightly   Seasonal allergies    takes Claritin daily   Sleep apnea    sleep study >54yrs ago   Stroke [facility])    left arm weakness   Past Surgical History:  Procedure Laterality Date   CATARACT EXTRACTION W/PHACO Right 02/14/2013   Procedure: CATARACT EXTRACTION PHACO AND INTRAOCULAR LENS PLACEMENT ([place]);  Surgeon: Adonis Brook, MD;  Location: [facility];  Service: Ophthalmology;  Laterality: Right;   CATARACT EXTRACTION W/PHACO Left 06/13/2013   Procedure: CATARACT EXTRACTION PHACO AND INTRAOCULAR LENS PLACEMENT ([facility]);  Surgeon: Adonis Brook, MD;  Location: [facility];  Service: Ophthalmology;  Laterality: Left;   CIRCUMCISION  20 yrs. ago   EYE SURGERY     Family History: History reviewed. No pertinent family history. Family Psychiatric  History: reports an Hx of BPAD Social History:  Social History   Substance and Sexual Activity  Alcohol Use Not Currently     Social History   Substance and Sexual Activity  Drug Use No    Social History   Socioeconomic History   Marital status: Divorced    Spouse name: Not on file   Number of children: Not on file   Years of education: Not on file   Highest education level: Not on file  Occupational History   Not on file  Tobacco Use   Smoking status: Never   Smokeless tobacco: Never  Vaping Use  Vaping Use: Never used  Substance and Sexual Activity   Alcohol use: Not Currently   Drug use: No   Sexual activity: Yes    Birth control/protection: None  Other Topics Concern   Not on file  Social History Narrative   Not on file   Social Determinants of Health   Financial Resource Strain: Not on file  Food Insecurity: Not on file  Transportation Needs: Not on file  Physical Activity: Not on file   Stress: Not on file  Social Connections: Not on file   Additional Social History:     Sleep: Fair  Appetite:  Fair  Current Medications: Current Facility-Administered Medications  Medication Dose Route Frequency Provider Last Rate Last Admin   acetaminophen (TYLENOL) tablet 650 mg  650 mg Oral Q6H PRN Chalmers Guest, NP       alum & mag hydroxide-simeth (MAALOX/MYLANTA) 200-200-20 MG/5ML suspension 30 mL  30 mL Oral Q4H PRN Chalmers Guest, NP       amLODipine (NORVASC) tablet 10 mg  10 mg Oral Daily Chalmers Guest, NP   10 mg at 02/04/21 [id number]   aspirin EC tablet 81 mg  81 mg Oral Daily Chalmers Guest, NP   81 mg at 02/04/21 [id number]   benztropine (COGENTIN) tablet 1 mg  1 mg Oral BID Chalmers Guest, NP   1 mg at 02/04/21 1655   cloNIDine (CATAPRES) tablet 0.2 mg  0.2 mg Oral BID Chalmers Guest, NP   0.2 mg at 02/04/21 1655   glipiZIDE (GLUCOTROL XL) 24 hr tablet 10 mg  10 mg Oral Q breakfast Chalmers Guest, NP   10 mg at 02/04/21 [id number]   haloperidol (HALDOL) tablet 20 mg  20 mg Oral QHS Chalmers Guest, NP   20 mg at 02/03/21 2058   insulin aspart (novoLOG) injection 0-15 Units  0-15 Units Subcutaneous TID WC Hill, Jackie [place], MD   8 Units at 02/04/21 1706   insulin aspart (novoLOG) injection 0-5 Units  0-5 Units Subcutaneous QHS Chalmers Guest, NP   4 Units at 02/03/21 2059   insulin aspart (novoLOG) injection 10 Units  10 Units Subcutaneous TID WC Corky Sox, MD   10 Units at 02/04/21 1733   insulin glargine-yfgn (SEMGLEE) injection 27 Units  27 Units Subcutaneous Daily Corky Sox, MD   27 Units at 02/04/21 0822   lisinopril (ZESTRIL) tablet 40 mg  40 mg Oral Daily Chalmers Guest, NP   40 mg at 02/04/21 0821   lithium carbonate (ESKALITH) CR tablet 450 mg  450 mg Oral Q12H Chalmers Guest, NP   450 mg at 02/04/21 0820   magnesium hydroxide (MILK OF MAGNESIA) suspension 30 mL  30 mL Oral Daily PRN Chalmers Guest, NP       OLANZapine (ZYPREXA) tablet 5 mg  5 mg Oral BID  Corky Sox, MD   5 mg at 02/04/21 0821   OLANZapine zydis (ZYPREXA) disintegrating tablet 10 mg  10 mg Oral Q8H PRN Chalmers Guest, NP   10 mg at 02/03/21 2342   And   ziprasidone (GEODON) injection 20 mg  20 mg Intramuscular PRN Chalmers Guest, NP       traZODone (DESYREL) tablet 50 mg  50 mg Oral QHS PRN Chalmers Guest, NP   50 mg at 02/03/21 2342    Lab Results:  Results for orders placed or performed during the hospital encounter of 01/28/21 (from the past 48 hour(s))  Glucose, capillary     Status: Abnormal   Collection Time: 02/02/21  6:33 PM  Result Value Ref Range   Glucose-Capillary 432 (H) 70 - 99 mg/dL    Comment: Glucose reference range applies only to samples taken after fasting for at least 8 hours.  Glucose, capillary     Status: Abnormal   Collection Time: 02/02/21  7:14 PM  Result Value Ref Range   Glucose-Capillary 450 (H) 70 - 99 mg/dL    Comment: Glucose reference range applies only to samples taken after fasting for at least 8 hours.  Glucose, capillary     Status: Abnormal   Collection Time: 02/02/21  8:31 PM  Result Value Ref Range   Glucose-Capillary 289 (H) 70 - 99 mg/dL    Comment: Glucose reference range applies only to samples taken after fasting for at least 8 hours.  Glucose, capillary     Status: Abnormal   Collection Time: 02/03/21  5:51 AM  Result Value Ref Range   Glucose-Capillary 213 (H) 70 - 99 mg/dL    Comment: Glucose reference range applies only to samples taken after fasting for at least 8 hours.  Glucose, capillary     Status: Abnormal   Collection Time: 02/03/21 11:57 AM  Result Value Ref Range   Glucose-Capillary 461 (H) 70 - 99 mg/dL    Comment: Glucose reference range applies only to samples taken after fasting for at least 8 hours.  Glucose, capillary     Status: Abnormal   Collection Time: 02/03/21  5:25 PM  Result Value Ref Range   Glucose-Capillary 163 (H) 70 - 99 mg/dL    Comment: Glucose reference range applies only to  samples taken after fasting for at least 8 hours.  Glucose, capillary     Status: Abnormal   Collection Time: 02/03/21  8:26 PM  Result Value Ref Range   Glucose-Capillary 318 (H) 70 - 99 mg/dL    Comment: Glucose reference range applies only to samples taken after fasting for at least 8 hours.  Glucose, capillary     Status: Abnormal   Collection Time: 02/04/21  5:54 AM  Result Value Ref Range   Glucose-Capillary 214 (H) 70 - 99 mg/dL    Comment: Glucose reference range applies only to samples taken after fasting for at least 8 hours.  Lithium level     Status: None   Collection Time: 02/04/21  7:10 AM  Result Value Ref Range   Lithium Lvl 0.68 0.60 - 1.20 mmol/L    Comment: Performed at [facility], [place] [address], [place],  [id number]  Basic metabolic panel     Status: Abnormal   Collection Time: 02/04/21  7:10 AM  Result Value Ref Range   Sodium 136 135 - 145 mmol/L   Potassium 4.1 3.5 - 5.1 mmol/L   Chloride 101 98 - 111 mmol/L   CO2 29 22 - 32 mmol/L   Glucose, Bld 217 (H) 70 - 99 mg/dL    Comment: Glucose reference range applies only to samples taken after fasting for at least 8 hours.   BUN 17 6 - 20 mg/dL   Creatinine, Ser 1.11 0.61 - 1.24 mg/dL   Calcium 9.5 8.9 - 10.3 mg/dL   GFR, Estimated >60 >60 mL/min    Comment: (NOTE) Calculated using the CKD-EPI Creatinine Equation (2021)    Anion gap 6 5 - 15    Comment: Performed at [facility], [address] [place], [place]  [address]  Glucose, capillary     Status: Abnormal   Collection Time: 02/04/21 12:02 PM  Result Value Ref Range   Glucose-Capillary 367 (H) 70 - 99 mg/dL    Comment: Glucose reference range applies only to samples taken after fasting for at least 8 hours.  Glucose, capillary     Status: Abnormal   Collection Time: 02/04/21  4:43 PM  Result Value Ref Range   Glucose-Capillary 297 (H) 70 - 99 mg/dL    Comment: Glucose reference range applies only to  samples taken after fasting for at least 8 hours.    Blood Alcohol level:  Lab Results  Component Value Date   ETH <10 01/26/2021   ETH <10 [id number]    Metabolic Disorder Labs: Lab Results  Component Value Date   HGBA1C 11.1 (H) 01/26/2021   MPG 271.87 01/26/2021   MPG 202.99 01/07/2019   No results found for: PROLACTIN Lab Results  Component Value Date   CHOL 140 01/30/2021   TRIG 95 01/30/2021   HDL 49 01/30/2021   CHOLHDL 2.9 01/30/2021   VLDL 19 01/30/2021   LDLCALC 72 01/30/2021   LDLCALC 67 05/20/2020    Physical Findings: AIMS: not yet assessed  Musculoskeletal: Strength & Muscle Tone: within normal limits Gait & Station: normal Patient leans: N/A  Psychiatric Specialty Exam:  Presentation  General Appearance: appropriate for environment Eye Contact:Fair; Fleeting  Speech:Blocked; Garbled  Speech Volume:Normal  Handedness:Right   Mood and Affect  Mood: "good"  Affect: constricted  Thought Process  Thought Processes: Disorganized; Irrevelant  Descriptions of Associations:Tangential  Orientation: disoriented to month and year Thought Content: frank delusions    History of Schizophrenia/Schizoaffective disorder:Yes  Duration of Psychotic Symptoms:Greater than six months  Hallucinations: denies  Ideas of Reference:Delusions  Suicidal Thoughts: none reported Homicidal Thoughts: none reported  Sensorium  Memory:Immediate Poor; Recent Poor; Remote Poor  Judgment:Poor  Insight:Poor   Executive Functions  Concentration:Poor  Attention Span:Poor  Recall:Poor  Fund of Knowledge:Poor  Language:Good   Psychomotor Activity  Psychomotor Activity: normal   Assets  Assets:Communication Skills; Social Support; Resilience   Sleep  Sleep: fair    Physical Exam: Physical Exam Vitals reviewed.  Constitutional:      Appearance: He is not ill-appearing or toxic-appearing.  Pulmonary:     Effort: Pulmonary effort is  normal.  Musculoskeletal:        General: Normal range of motion.  Neurological:     General: No focal deficit present.     Mental Status: He is alert.   ROS not assessed on this encounter Blood pressure 131/81, pulse 98, temperature 98.5 F (36.9 C), temperature source Oral, resp. rate 18, height 5' 3.5" (1.613 m), weight 91.6 kg, SpO2 97 %. Body mass index is 35.22 kg/m.  Assessment: Schizophrenia HTN T2DM, with insulin dependence   Psychosis -Continue Haldol 20 mg QHS for psychosis -Continue Zyprexa 5 mg BID for intractable psychosis -Consider Caplyta or clozapine (will continue to discuss with pharmacy and social work) -Continue Cogentin 1 mg BID for EPS prevention -Consider LAI given lack of compliance and recurrent hospitalizations -Continue Lithium 450 mg BID for mood stability             -Li level of 0->0.7->0.6->0.66->0.68 over consecutive days   Agitation med protocol ordered: -Zydis 10 mg q8hr prn for agitation  -Ativan 1 mg once for anxiety and severe agitation -Geodon 20 mg IM once for agitation Will consider changing to haldol PRN   Continue PRN's: Tylenol,  Maalox, Atarax, Milk of Magnesia, Trazodone   Medical Management Covid negative CMP: Cr of 1.45 CBC: unremarkable EtOH: <10 UDS: pending TSH: not collected A1C: 11.1 Lipids: pending   T2DM with insulin dependence Poorly controlled as evidenced by A1C. Home insulin regimen of 10U lantus daily and levemir 20U QHS (?) as well as Humalog 2-10U BID. Fasting CBG of 214 this AM with range up to 461 over the past 24 hrs.  -Increase to 27U of glargine daily -Continue moderate SSI -Continue QHS correction -Continue 10U pranidal coverage  -Continue glipizide   HTN Continue home regimen -Lisinopril 40 mg daily -Amlodipine 10 mg daily -Clonidine 0.2 mg BID   Elevated Cr (resolved) Cr of 1.45 on a baseline of  1-1.2 at admission, likely pre-renal in the context of decreased PO. Cr down to 1.29 then  normalized.    Carlyn Reichert, MD 02/04/2021, 6:06 PM

## 2021-02-04 NOTE — Progress Notes (Signed)
Recreation Therapy Notes  Date: 11.9.22 Time: 1000-1025 Location: 500 Hall  Group Topic: Values Clarification   Goal Area(s) Addresses:  Patient will identify what brings them peace. Patient will identify the importance of having peace post d/c.  Intervention: Worksheet  Activity: Financial planner.  Patients were to identify their definitions of peace.  Patients were then to explain what the five steps of finding peace for them were.  LRT and patients went over the sheets together to get an understanding of how each person viewed the topic.  Education:  Values, Discharge Planning  Education Outcome: Acknowledges understanding/In group clarification offered/Needs additional education.   Clinical Observations/Feedback: Due to disruption/acuity on the, LRT did 1:1 worksheets with patients on finding peace.  Pt did not participate.     Caroll Rancher, LRT,CTRS        Caroll Rancher A 02/04/2021 2:09 PM

## 2021-02-04 NOTE — Progress Notes (Signed)
Inpatient Diabetes Program Recommendations  AACE/ADA: New Consensus Statement on Inpatient Glycemic Control (2015)  Target Ranges:  Prepandial:   less than 140 mg/dL      Peak postprandial:   less than 180 mg/dL (1-2 hours)      Critically ill patients:  140 - 180 mg/dL   Lab Results  Component Value Date   GLUCAP 367 (H) 02/04/2021   HGBA1C 11.1 (H) 01/26/2021    Review of Glycemic Control Results for ANGELES, PAOLUCCI (MRN [id number]) as of 02/04/2021 12:48  Ref. Range 02/03/2021 20:26 02/04/2021 05:54 02/04/2021 12:02  Glucose-Capillary Latest Ref Range: 70 - 99 mg/dL 888 (H) 280 (H) 034 (H)   Diabetes history: DM2 Outpatient Diabetes medications: Semglee 10 units QD, Novolog 10 units TID, glipizide 10 mg QAM Current orders for Inpatient glycemic control: Semglee 27 units QD, Novolog 0-15 units TID with meals and 0-5 HS + 10 units TID, glipizide 10 mg QAM   HgbA1C 11.1%   Inpatient Diabetes Program Recommendations:     Increase Semglee to 32 units QD and Novolog 15 units TID (assuming patient is consuming >50% of meals).   Thanks, Lujean Rave, MSN, RNC-OB Diabetes Coordinator [phone] (8a-5p)

## 2021-02-04 NOTE — Progress Notes (Signed)
   02/04/21 1000  Psych Admission Type (Psych Patients Only)  Admission Status Involuntary  Psychosocial Assessment  Patient Complaints None  Eye Contact Avoids  Facial Expression Blank  Affect Preoccupied  Speech Logical/coherent  Interaction Minimal;No initiation  Motor Activity Slow;Shuffling  Appearance/Hygiene Unremarkable  Behavior Characteristics Cooperative  Mood Preoccupied  Thought Process  Coherency Disorganized  Content Preoccupation  Delusions Paranoid  Perception Hallucinations  Hallucination None reported or observed  Judgment Impaired  Confusion Mild  Danger to Self  Current suicidal ideation? Denies  Danger to Others  Danger to Others None reported or observed  Dar Note: Patient presents with a calm affect and mood.  Medications given as prescribed.  Routine safety checks maintained.  Patient is observed taking to himself.  Patient is safe on and off the unit.

## 2021-02-04 NOTE — BHH Group Notes (Signed)
Adult Psychoeducational Group Note  Date:  02/04/2021 Time:  4:41 PM  Group Topic/Focus:  Relaxation Group  Participation Level:  Active  Participation Quality:  Appropriate  Affect:  Appropriate  Cognitive:  Appropriate  Insight: Appropriate  Engagement in Group:  Engaged  Modes of Intervention:  Activity  Additional Comments:    Donell Beers 02/04/2021, 4:41 PM

## 2021-02-05 DIAGNOSIS — F23 Brief psychotic disorder: Secondary | ICD-10-CM | POA: Diagnosis not present

## 2021-02-05 DIAGNOSIS — F209 Schizophrenia, unspecified: Secondary | ICD-10-CM | POA: Diagnosis not present

## 2021-02-05 LAB — GLUCOSE, CAPILLARY
Glucose-Capillary: 256 mg/dL — ABNORMAL HIGH (ref 70–99)
Glucose-Capillary: 349 mg/dL — ABNORMAL HIGH (ref 70–99)
Glucose-Capillary: 322 mg/dL — ABNORMAL HIGH (ref 70–99)
Glucose-Capillary: 315 mg/dL — ABNORMAL HIGH (ref 70–99)

## 2021-02-05 MED ORDER — INSULIN GLARGINE-YFGN 100 UNIT/ML ~~LOC~~ SOLN
32.0000 [IU] | Freq: Every day | SUBCUTANEOUS | Status: DC
Start: 2021-02-05 — End: 2021-02-06
  Administered 2021-02-05: 32 [IU] via SUBCUTANEOUS

## 2021-02-05 MED ORDER — INSULIN ASPART 100 UNIT/ML IJ SOLN
12.0000 [IU] | Freq: Three times a day (TID) | INTRAMUSCULAR | Status: DC
  Administered 2021-02-05 – 2021-02-08 (×10): 12 [IU] via SUBCUTANEOUS

## 2021-02-05 NOTE — Group Note (Signed)
Recreation Therapy Group Note   Group Topic:Self-Esteem  Group Date: 02/05/2021 Start Time: 1000 End Time: 1045 Facilitators: Caroll Rancher, LRT/CTRS Location: 500 Hall Dayroom   Goal Area(s) Addresses:  Patient will identify and write positive traits about themselves. Patient will successfully identify all the influences in their lives.  Group Description:  LRT began group session with open dialogue asking the patients to define self-esteem and verbally identify positive qualities and traits people may possess. Patients were then instructed to design a personalized license plate, with words and drawings, representing at least 3 positive things about themselves. Pts were encouraged to include favorites, things they are proud of, what they enjoy doing, and dreams for their future. If a patient had a life motto or a meaningful phase that expressed their life values, pt's were asked to incorporate that into their design as well. Patients were given the opportunity to share their completed work with the group.   Affect/Mood: Flat   Participation Level: Observed   Participation Quality: None   Behavior: Appropriate   Speech/Thought Process: None   Insight: Impaired   Judgement: Impaired   Modes of Intervention: Art and Music   Patient Response to Interventions:  None   Education Outcome:  Acknowledges education and In group clarification offered    Clinical Observations/Individualized Feedback: Pt sat quietly during group.  Pt dosed off during group session.  Pt was then called out of group to meet with doctor and did not return.    Plan: Continue to engage patient in RT group sessions 2-3x/week.   Caroll Rancher, LRT/CTRS 02/05/2021 11:45 AM

## 2021-02-05 NOTE — Progress Notes (Signed)
The patient just came out of his room with his shirt unbuttoned and was making comments about food and how he did not want anything to be thrown out. This is the third time that the patient has come out of his room talking to himself in the past one and a half hours.

## 2021-02-05 NOTE — Progress Notes (Signed)
Mitchell [name] was in his room much of the evening.  He did not attend evening wrap up group.  He did come out for snack, coffee and briefly watched TV.  He was irritable and hostile.  He declined any prn's for sleep.  He took his hs medications without difficulty.  He denied SI/HI or AVH but he is clearly responding to internal stimuli.  He is currently resting with his eyes closed and appears to be asleep.  Q 15 minute checks maintained for safety.   02/05/21 2145  Psych Admission Type (Psych Patients Only)  Admission Status Involuntary  Psychosocial Assessment  Patient Complaints None  Eye Contact Avertive  Facial Expression Anxious;Angry  Affect Irritable;Preoccupied  Speech Tangential;Argumentative  Interaction Minimal  Motor Activity Slow;Shuffling  Appearance/Hygiene Disheveled  Behavior Characteristics Anxious;Irritable  Mood Suspicious;Irritable  Thought Process  Coherency Disorganized;Tangential  Content Preoccupation  Delusions Paranoid  Perception Derealization;Hallucinations  Hallucination None reported or observed (appears to be responding to internal stimuli)  Judgment Impaired  Confusion Moderate  Danger to Self  Current suicidal ideation? Denies  Danger to Others  Danger to Others None reported or observed

## 2021-02-05 NOTE — Progress Notes (Signed)
Called and spoke with the director of [employer], Orson Ape, at [phone].  She reports that the patient established services with them starting in 2020.  And that since then the patient has not had a trial of clozapine.  On record review in the [facility], there is no evidence of a trial of clozapine.  She provides more background history about the patient.  Starting around 2 months ago the patient began becoming more oppositional, stating "I am not going to take my meds".  (Normally the company sees the patient Monday through Friday and ensures that they take the medication.  On the weekends a provider calls the patient and encourages them to take the medication).  He is reported by his fellow residents at his apartment complex to be have been outside walking naked.  This was documented on video.  Is also reported that he was stealing packages.  He was charged with larceny and currently his apartment complex is considering evicting him.  She reports that the patient sees a Publishing rights manager with the patient service provider.  Based on her description, it does not sound likely that this person would prescribe clozapine.  They are willing to transport the patient to an appointment with a psychiatrist who would be willing to prescribe clozapine.  She reports that their company is willing to coordinate weekly blood draws for the patient.  Carlyn Reichert, MD PGY-1

## 2021-02-05 NOTE — BHH Group Notes (Signed)
Adult Psychoeducational Group Note  Date:  02/05/2021 Time:  9:42 AM  Group Topic/Focus:  Goals Group:   The focus of this group is to help patients establish daily goals to achieve during treatment and discuss how the patient can incorporate goal setting into their daily lives to aide in recovery.  Participation Level:  Active  Participation Quality:  Appropriate  Affect:  Appropriate  Cognitive:  Appropriate  Insight: Appropriate  Engagement in Group:  Engaged  Modes of Intervention:  Clarification  Additional Comments:    Donell Beers 02/05/2021, 9:42 AM

## 2021-02-05 NOTE — Progress Notes (Signed)
[facility] MD Progress Note  02/05/2021 4:32 PM Mitchell [name]  MRN:  [id number] Subjective:   Mitchell [name] is a 59 year old male with a past history of schizophrenia presenting under IVC for acute psychosis. He was exhibiting bizarre behavior (e.g. running through the streets naked).   Yesterday the following changes were made to the patient's regimen: No changes made  The patient's chart was reviewed and nursing notes were reviewed. Over the past 24 hrs, the patient received no PRN medications for agitation. Per staff the patient was talking with unseen others, reported seeing his dead daughter. The patient's case was discussed in multidisciplinary team meeting.   On interview this morning, the patient continues to exhibit delusions. He has a guarded affect and is often unwilling to disclose information. He talks about the Bible and upon further inquiry talks about the [place] and says that the primary message is that "I can do whatever I want, whenever I want". He reports that his home is the hospital and that he been living here a long time. He reports that the month is October and the year is 2023. He denies suicidal thoughts and denies auditory/visual hallucinations or first rank symptoms.   See other note for discussion with Manufacturing systems engineer.   Principal Problem: Schizophrenia ([place]) Diagnosis: Principal Problem:   Schizophrenia ([place])  Total Time Spent in Direct Patient Care: I personally spent 30 minutes on the unit in direct patient care. The direct patient care time included face-to-face time with the patient, reviewing the patient's chart, communicating with other professionals, and coordinating care. Greater than 50% of this time was spent in counseling or coordinating care with the patient regarding goals of hospitalization, psycho-education, and discharge planning needs.   Past Psychiatric History: schizophrenia/schizoaffective disorder  Past Medical History:  Past Medical History:   Diagnosis Date   Bipolar affective disorder ([place])    takes Zyprexa daily   Diabetes mellitus    takes Victoza,Metformin,and Glipizide daily   Hypertension    takes Amlodipine,Lisinopril and Clonidine daily   Hyponatremia    history of   Mental disorder    takes Lithium daily   Schizoaffective disorder    takes Trazodone nightly   Seasonal allergies    takes Claritin daily   Sleep apnea    sleep study >58yrs ago   Stroke [facility])    left arm weakness   Past Surgical History:  Procedure Laterality Date   CATARACT EXTRACTION W/PHACO Right 02/14/2013   Procedure: CATARACT EXTRACTION PHACO AND INTRAOCULAR LENS PLACEMENT ([place]);  Surgeon: Adonis Brook, MD;  Location: [facility];  Service: Ophthalmology;  Laterality: Right;   CATARACT EXTRACTION W/PHACO Left 06/13/2013   Procedure: CATARACT EXTRACTION PHACO AND INTRAOCULAR LENS PLACEMENT ([facility]);  Surgeon: Adonis Brook, MD;  Location: [place];  Service: Ophthalmology;  Laterality: Left;   CIRCUMCISION  20 yrs. ago   EYE SURGERY     Family History: History reviewed. No pertinent family history. Family Psychiatric  History: reports an Hx of BPAD Social History:  Social History   Substance and Sexual Activity  Alcohol Use Not Currently     Social History   Substance and Sexual Activity  Drug Use No    Social History   Socioeconomic History   Marital status: Divorced    Spouse name: Not on file   Number of children: Not on file   Years of education: Not on file   Highest education level: Not on file  Occupational History   Not on file  Tobacco Use   Smoking status: Never   Smokeless tobacco: Never  Vaping Use   Vaping Use: Never used  Substance and Sexual Activity   Alcohol use: Not Currently   Drug use: No   Sexual activity: Yes    Birth control/protection: None  Other Topics Concern   Not on file  Social History Narrative   Not on file   Social Determinants of Health   Financial Resource Strain: Not on file  Food  Insecurity: Not on file  Transportation Needs: Not on file  Physical Activity: Not on file  Stress: Not on file  Social Connections: Not on file   Additional Social History:     Sleep: Fair  Appetite:  Fair  Current Medications: Current Facility-Administered Medications  Medication Dose Route Frequency Provider Last Rate Last Admin   acetaminophen (TYLENOL) tablet 650 mg  650 mg Oral Q6H PRN Novella Olive, NP       alum & mag hydroxide-simeth (MAALOX/MYLANTA) 200-200-20 MG/5ML suspension 30 mL  30 mL Oral Q4H PRN Novella Olive, NP       amLODipine (NORVASC) tablet 10 mg  10 mg Oral Daily Novella Olive, NP   10 mg at 02/05/21 0830   aspirin EC tablet 81 mg  81 mg Oral Daily Novella Olive, NP   81 mg at 02/05/21 0829   benztropine (COGENTIN) tablet 1 mg  1 mg Oral BID Novella Olive, NP   1 mg at 02/05/21 0830   cloNIDine (CATAPRES) tablet 0.2 mg  0.2 mg Oral BID Novella Olive, NP   0.2 mg at 02/05/21 0830   glipiZIDE (GLUCOTROL XL) 24 hr tablet 10 mg  10 mg Oral Q breakfast Novella Olive, NP   10 mg at 02/05/21 0830   haloperidol (HALDOL) tablet 20 mg  20 mg Oral QHS Novella Olive, NP   20 mg at 02/04/21 2106   insulin aspart (novoLOG) injection 0-15 Units  0-15 Units Subcutaneous TID WC Roselle Locus, MD   11 Units at 02/05/21 1202   insulin aspart (novoLOG) injection 0-5 Units  0-5 Units Subcutaneous QHS Novella Olive, NP   5 Units at 02/04/21 2107   insulin aspart (novoLOG) injection 12 Units  12 Units Subcutaneous TID WC Carlyn Reichert, MD   12 Units at 02/05/21 1203   insulin glargine-yfgn (SEMGLEE) injection 32 Units  32 Units Subcutaneous Daily Carlyn Reichert, MD   32 Units at 02/05/21 0831   lisinopril (ZESTRIL) tablet 40 mg  40 mg Oral Daily Novella Olive, NP   40 mg at 02/05/21 0830   lithium carbonate (ESKALITH) CR tablet 450 mg  450 mg Oral Q12H Novella Olive, NP   450 mg at 02/05/21 3474   magnesium hydroxide (MILK OF MAGNESIA) suspension 30 mL  30 mL  Oral Daily PRN Novella Olive, NP       OLANZapine (ZYPREXA) tablet 5 mg  5 mg Oral BID Carlyn Reichert, MD   5 mg at 02/05/21 0834   OLANZapine zydis (ZYPREXA) disintegrating tablet 10 mg  10 mg Oral Q8H PRN Novella Olive, NP   10 mg at 02/03/21 2342   And   ziprasidone (GEODON) injection 20 mg  20 mg Intramuscular PRN Novella Olive, NP       traZODone (DESYREL) tablet 50 mg  50 mg Oral QHS PRN Novella Olive, NP   50 mg at 02/03/21 2342    Lab Results:  Results for orders placed or performed during the hospital encounter of 01/28/21 (from the past 48 hour(s))  Glucose, capillary     Status: Abnormal   Collection Time: 02/03/21  5:25 PM  Result Value Ref Range   Glucose-Capillary 163 (H) 70 - 99 mg/dL    Comment: Glucose reference range applies only to samples taken after fasting for at least 8 hours.  Glucose, capillary     Status: Abnormal   Collection Time: 02/03/21  8:26 PM  Result Value Ref Range   Glucose-Capillary 318 (H) 70 - 99 mg/dL    Comment: Glucose reference range applies only to samples taken after fasting for at least 8 hours.  Glucose, capillary     Status: Abnormal   Collection Time: 02/04/21  5:54 AM  Result Value Ref Range   Glucose-Capillary 214 (H) 70 - 99 mg/dL    Comment: Glucose reference range applies only to samples taken after fasting for at least 8 hours.  Lithium level     Status: None   Collection Time: 02/04/21  7:10 AM  Result Value Ref Range   Lithium Lvl 0.68 0.60 - 1.20 mmol/L    Comment: Performed at [facility], [place] [address] [place], [place] [id number]  Basic metabolic panel     Status: Abnormal   Collection Time: 02/04/21  7:10 AM  Result Value Ref Range   Sodium 136 135 - 145 mmol/L   Potassium 4.1 3.5 - 5.1 mmol/L   Chloride 101 98 - 111 mmol/L   CO2 29 22 - 32 mmol/L   Glucose, Bld 217 (H) 70 - 99 mg/dL    Comment: Glucose reference range applies only to samples taken after fasting for at least 8 hours.   BUN 17  6 - 20 mg/dL   Creatinine, Ser 1.11 0.61 - 1.24 mg/dL   Calcium 9.5 8.9 - 10.3 mg/dL   GFR, Estimated >60 >60 mL/min    Comment: (NOTE) Calculated using the CKD-EPI Creatinine Equation (2021)    Anion gap 6 5 - 15    Comment: Performed at [facility], [place] [address] [place], [place] [address]  Glucose, capillary     Status: Abnormal   Collection Time: 02/04/21 12:02 PM  Result Value Ref Range   Glucose-Capillary 367 (H) 70 - 99 mg/dL    Comment: Glucose reference range applies only to samples taken after fasting for at least 8 hours.  Glucose, capillary     Status: Abnormal   Collection Time: 02/04/21  4:43 PM  Result Value Ref Range   Glucose-Capillary 297 (H) 70 - 99 mg/dL    Comment: Glucose reference range applies only to samples taken after fasting for at least 8 hours.  Glucose, capillary     Status: Abnormal   Collection Time: 02/04/21  8:07 PM  Result Value Ref Range   Glucose-Capillary 377 (H) 70 - 99 mg/dL    Comment: Glucose reference range applies only to samples taken after fasting for at least 8 hours.  Glucose, capillary     Status: Abnormal   Collection Time: 02/05/21  5:55 AM  Result Value Ref Range   Glucose-Capillary 256 (H) 70 - 99 mg/dL    Comment: Glucose reference range applies only to samples taken after fasting for at least 8 hours.  Glucose, capillary     Status: Abnormal   Collection Time: 02/05/21 11:56 AM  Result Value Ref Range   Glucose-Capillary 349 (H) 70 - 99 mg/dL  Comment: Glucose reference range applies only to samples taken after fasting for at least 8 hours.    Blood Alcohol level:  Lab Results  Component Value Date   ETH <10 01/26/2021   ETH <10 [id number]    Metabolic Disorder Labs: Lab Results  Component Value Date   HGBA1C 11.1 (H) 01/26/2021   MPG 271.87 01/26/2021   MPG 202.99 01/07/2019   No results found for: PROLACTIN Lab Results  Component Value Date   CHOL 140 01/30/2021   TRIG 95 01/30/2021    HDL 49 01/30/2021   CHOLHDL 2.9 01/30/2021   VLDL 19 01/30/2021   LDLCALC 72 01/30/2021   LDLCALC 67 05/20/2020    Physical Findings: AIMS: not yet assessed  Musculoskeletal: Strength & Muscle Tone: within normal limits Gait & Station: normal Patient leans: N/A  Psychiatric Specialty Exam:  Presentation  General Appearance: appropriate for environment Eye Contact:Fair; Fleeting  Speech:Blocked; Garbled  Speech Volume:Normal  Handedness:Right   Mood and Affect  Mood: "good"  Affect: constricted  Thought Process  Thought Processes: Disorganized; Irrevelant  Descriptions of Associations:Tangential  Orientation: disoriented to month and year Thought Content: frank delusions    History of Schizophrenia/Schizoaffective disorder:Yes  Duration of Psychotic Symptoms:Greater than six months  Hallucinations: denies  Ideas of Reference:Delusions  Suicidal Thoughts: none reported Homicidal Thoughts: none reported  Sensorium  Memory:Immediate Poor; Recent Poor; Remote Poor  Judgment:Poor  Insight:Poor   Executive Functions  Concentration:Poor  Attention Span:Poor  Recall:Poor  Fund of Knowledge:Poor  Language:Good   Psychomotor Activity  Psychomotor Activity: normal   Assets  Assets:Communication Skills; Social Support; Resilience   Sleep  Sleep: fair    Physical Exam: Physical Exam Vitals reviewed.  Constitutional:      Appearance: He is not ill-appearing or toxic-appearing.  Pulmonary:     Effort: Pulmonary effort is normal.  Musculoskeletal:        General: Normal range of motion.  Neurological:     General: No focal deficit present.     Mental Status: He is alert.   ROS not assessed on this encounter Blood pressure 137/76, pulse 87, temperature 98.8 F (37.1 C), temperature source Oral, resp. rate 18, height 5' 3.5" (1.613 m), weight 91.6 kg, SpO2 97 %. Body mass index is 35.22  kg/m.  Assessment: Schizophrenia HTN T2DM, with insulin dependence   Psychosis -Continue Haldol 20 mg QHS for psychosis -Continue Zyprexa 5 mg BID for intractable psychosis -Patient is a good candidate for clozapine given refractory psychosis and good outpatient services.  -Will plan for one more trial of antipsychotic, (possibly loxepine, asenapine, or Thorazine) for 4 days and then transition to clozapine. -Continue Cogentin 1 mg BID for EPS prevention -Continue Lithium 450 mg BID for mood stability             -Li level of 0->0.7->0.6->0.66->0.68 over consecutive days   Agitation med protocol ordered: -Zydis 10 mg q8hr prn for agitation  -Ativan 1 mg once for anxiety and severe agitation -Geodon 20 mg IM once for agitation Will consider changing to haldol PRN   Continue PRN's: Tylenol, Maalox, Atarax, Milk of Magnesia, Trazodone   Medical Management Covid negative CMP: Cr of 1.45 CBC: unremarkable EtOH: <10 UDS: pending TSH: not collected A1C: 11.1 Lipids: pending   T2DM with insulin dependence Poorly controlled as evidenced by A1C. Home insulin regimen of 10U lantus daily and levemir 20U QHS (?) as well as Humalog 2-10U BID. Fasting CBG of 256 this AM with range up to 377  over the past 24 hrs.  -Increase to 32U of glargine daily -Continue moderate SSI -Continue QHS correction -Increase to 12U pranidal coverage  -Continue glipizide   HTN Continue home regimen -Lisinopril 40 mg daily -Amlodipine 10 mg daily -Clonidine 0.2 mg BID   Elevated Cr (resolved) Cr of 1.45 on a baseline of  1-1.2 at admission, likely pre-renal in the context of decreased PO. Cr down to 1.29 then normalized.    Corky Sox, MD 02/05/2021, 4:32 PM

## 2021-02-05 NOTE — [facility] Group Notes (Signed)
PT was notified but did not attend group. 

## 2021-02-06 DIAGNOSIS — F209 Schizophrenia, unspecified: Secondary | ICD-10-CM | POA: Diagnosis not present

## 2021-02-06 DIAGNOSIS — F23 Brief psychotic disorder: Secondary | ICD-10-CM | POA: Diagnosis not present

## 2021-02-06 LAB — GLUCOSE, CAPILLARY
Glucose-Capillary: 198 mg/dL — ABNORMAL HIGH (ref 70–99)
Glucose-Capillary: 265 mg/dL — ABNORMAL HIGH (ref 70–99)
Glucose-Capillary: 294 mg/dL — ABNORMAL HIGH (ref 70–99)
Glucose-Capillary: 360 mg/dL — ABNORMAL HIGH (ref 70–99)

## 2021-02-06 MED ORDER — INSULIN GLARGINE-YFGN 100 UNIT/ML ~~LOC~~ SOLN
38.0000 [IU] | Freq: Every day | SUBCUTANEOUS | Status: DC
Start: 2021-02-06 — End: 2021-02-07
  Administered 2021-02-06: 38 [IU] via SUBCUTANEOUS

## 2021-02-06 MED ORDER — LOXAPINE SUCCINATE 10 MG PO CAPS
10.0000 mg | ORAL_CAPSULE | Freq: Two times a day (BID) | ORAL | Status: DC
Start: 2021-02-06 — End: 2021-02-07
  Administered 2021-02-06 – 2021-02-07 (×3): 10 mg via ORAL
  Filled 2021-02-06 (×7): qty 1

## 2021-02-06 NOTE — Progress Notes (Signed)
   02/06/21 1000  Psych Admission Type (Psych Patients Only)  Admission Status Involuntary  Psychosocial Assessment  Patient Complaints None  Eye Contact Avertive  Facial Expression Anxious;Angry  Affect Irritable;Preoccupied  Speech Tangential;Argumentative  Interaction Minimal  Motor Activity Slow;Shuffling  Appearance/Hygiene Disheveled  Behavior Characteristics Cooperative  Mood Preoccupied  Thought Process  Coherency Disorganized;Tangential  Content Preoccupation  Delusions Paranoid  Perception Derealization;Hallucinations  Hallucination None reported or observed (appears to be responding to internal stimuli)  Judgment Impaired  Confusion Moderate  Danger to Self  Current suicidal ideation? Denies  Danger to Others  Danger to Others None reported or observed

## 2021-02-06 NOTE — [facility] Group Notes (Signed)
Pt didn't attend group. 

## 2021-02-06 NOTE — Progress Notes (Addendum)
[facility] MD Progress Note  02/06/2021 6:23 PM Mitchell [name]  MRN:  [id number] Subjective:   Mitchell [name] is a 60 year old male with a past history of schizophrenia presenting under IVC for acute psychosis. He was exhibiting bizarre behavior (e.g. running through the streets naked).   Yesterday the following changes were made to the patient's regimen: -Start Loxapine 10 mg BID for psychosis  The patient's chart was reviewed and nursing notes were reviewed. Over the past 24 hrs, the patient received no PRN medications for agitation. Per staff the patient was talking with unseen others. The patient's case was discussed in multidisciplinary team meeting.   On interview this morning, the patient appears similar to previous days.  He reports again on this his home and he has been here approximately 12 weeks.  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  He asked the writer to check his blood type and states that he will find large amounts of money there.  He denies suicidal ideation and auditory/visual hallucinations.  Principal Problem: Schizophrenia (HCC) Diagnosis: Principal Problem:   Schizophrenia (HCC)  Total Time Spent in Direct Patient Care: I personally spent 30 minutes on the unit in direct patient care. The direct patient care time included face-to-face time with the patient, reviewing the patient's chart, communicating with other professionals, and coordinating care. Greater than 50% of this time was spent in counseling or coordinating care with the patient regarding goals of hospitalization, psycho-education, and discharge planning needs.   Past Psychiatric History: schizophrenia/schizoaffective disorder  Past Medical History:  Past Medical History:  Diagnosis Date   Bipolar affective disorder (HCC)    takes Zyprexa daily   Diabetes mellitus    takes Victoza,Metformin,and  Glipizide daily   Hypertension    takes Amlodipine,Lisinopril and Clonidine daily   Hyponatremia    history of   Mental disorder    takes Lithium daily   Schizoaffective disorder    takes Trazodone nightly   Seasonal allergies    takes Claritin daily   Sleep apnea    sleep study >27yrs ago   Stroke [facility])    left arm weakness   Past Surgical History:  Procedure Laterality Date   CATARACT EXTRACTION W/PHACO Right 02/14/2013   Procedure: CATARACT EXTRACTION PHACO AND INTRAOCULAR LENS PLACEMENT (IOC);  Surgeon: Shade Flood, MD;  Location: [facility] OR;  Service: Ophthalmology;  Laterality: Right;   CATARACT EXTRACTION W/PHACO Left 06/13/2013   Procedure: CATARACT EXTRACTION PHACO AND INTRAOCULAR LENS PLACEMENT (IOC);  Surgeon: Shade Flood, MD;  Location: [facility] OR;  Service: Ophthalmology;  Laterality: Left;   CIRCUMCISION  20 yrs. ago   EYE SURGERY     Family History: History reviewed. No pertinent family history. Family Psychiatric  History: reports an Hx of BPAD Social History:  Social History   Substance and Sexual Activity  Alcohol Use Not Currently     Social History   Substance and Sexual Activity  Drug Use No    Social History   Socioeconomic History   Marital status: Divorced    Spouse name: Not on file   Number of children: Not on file   Years of education: Not on file   Highest education level: Not on file  Occupational History   Not on file  Tobacco Use   Smoking status: Never   Smokeless tobacco: Never  Vaping Use   Vaping Use: Never used  Substance and Sexual  Activity   Alcohol use: Not Currently   Drug use: No   Sexual activity: Yes    Birth control/protection: None  Other Topics Concern   Not on file  Social History Narrative   Not on file   Social Determinants of Health   Financial Resource Strain: Not on file  Food Insecurity: Not on file  Transportation Needs: Not on file  Physical Activity: Not on file  Stress: Not on file  Social  Connections: Not on file   Additional Social History:     Sleep: Fair  Appetite:  Fair  Current Medications: Current Facility-Administered Medications  Medication Dose Route Frequency Provider Last Rate Last Admin   acetaminophen (TYLENOL) tablet 650 mg  650 mg Oral Q6H PRN Chalmers Guest, NP       alum & mag hydroxide-simeth (MAALOX/MYLANTA) 200-200-20 MG/5ML suspension 30 mL  30 mL Oral Q4H PRN Chalmers Guest, NP       amLODipine (NORVASC) tablet 10 mg  10 mg Oral Daily Chalmers Guest, NP   10 mg at 02/06/21 0827   aspirin EC tablet 81 mg  81 mg Oral Daily Chalmers Guest, NP   81 mg at 02/06/21 0829   benztropine (COGENTIN) tablet 1 mg  1 mg Oral BID Chalmers Guest, NP   1 mg at 02/06/21 1716   cloNIDine (CATAPRES) tablet 0.2 mg  0.2 mg Oral BID Chalmers Guest, NP   0.2 mg at 02/06/21 1716   glipiZIDE (GLUCOTROL XL) 24 hr tablet 10 mg  10 mg Oral Q breakfast Chalmers Guest, NP   10 mg at 02/06/21 0827   haloperidol (HALDOL) tablet 20 mg  20 mg Oral QHS Chalmers Guest, NP   20 mg at 02/05/21 2145   insulin aspart (novoLOG) injection 0-15 Units  0-15 Units Subcutaneous TID WC Hill, Jackie Plum, MD   8 Units at 02/06/21 1714   insulin aspart (novoLOG) injection 0-5 Units  0-5 Units Subcutaneous QHS Chalmers Guest, NP   4 Units at 02/05/21 2144   insulin aspart (novoLOG) injection 12 Units  12 Units Subcutaneous TID WC Corky Sox, MD   12 Units at 02/06/21 1715   insulin glargine-yfgn (SEMGLEE) injection 38 Units  38 Units Subcutaneous Daily Corky Sox, MD   38 Units at 02/06/21 0829   lisinopril (ZESTRIL) tablet 40 mg  40 mg Oral Daily Chalmers Guest, NP   40 mg at 02/06/21 0828   lithium carbonate (ESKALITH) CR tablet 450 mg  450 mg Oral Q12H Chalmers Guest, NP   450 mg at 02/06/21 0827   loxapine (LOXITANE) capsule 10 mg  10 mg Oral BID Corky Sox, MD   10 mg at 02/06/21 1716   magnesium hydroxide (MILK OF MAGNESIA) suspension 30 mL  30 mL Oral Daily PRN Chalmers Guest,  NP       OLANZapine zydis (ZYPREXA) disintegrating tablet 10 mg  10 mg Oral Q8H PRN Chalmers Guest, NP   10 mg at 02/03/21 2342   And   ziprasidone (GEODON) injection 20 mg  20 mg Intramuscular PRN Chalmers Guest, NP       traZODone (DESYREL) tablet 50 mg  50 mg Oral QHS PRN Chalmers Guest, NP   50 mg at 02/03/21 2342    Lab Results:  Results for orders placed or performed during the hospital encounter of 01/28/21 (from the past 48 hour(s))  Glucose, capillary     Status:  Abnormal   Collection Time: 02/04/21  8:07 PM  Result Value Ref Range   Glucose-Capillary 377 (H) 70 - 99 mg/dL    Comment: Glucose reference range applies only to samples taken after fasting for at least 8 hours.  Glucose, capillary     Status: Abnormal   Collection Time: 02/05/21  5:55 AM  Result Value Ref Range   Glucose-Capillary 256 (H) 70 - 99 mg/dL    Comment: Glucose reference range applies only to samples taken after fasting for at least 8 hours.  Glucose, capillary     Status: Abnormal   Collection Time: 02/05/21 11:56 AM  Result Value Ref Range   Glucose-Capillary 349 (H) 70 - 99 mg/dL    Comment: Glucose reference range applies only to samples taken after fasting for at least 8 hours.  Glucose, capillary     Status: Abnormal   Collection Time: 02/05/21  5:32 PM  Result Value Ref Range   Glucose-Capillary 322 (H) 70 - 99 mg/dL    Comment: Glucose reference range applies only to samples taken after fasting for at least 8 hours.  Glucose, capillary     Status: Abnormal   Collection Time: 02/05/21  8:16 PM  Result Value Ref Range   Glucose-Capillary 315 (H) 70 - 99 mg/dL    Comment: Glucose reference range applies only to samples taken after fasting for at least 8 hours.   Comment 1 Notify RN   Glucose, capillary     Status: Abnormal   Collection Time: 02/06/21  5:46 AM  Result Value Ref Range   Glucose-Capillary 198 (H) 70 - 99 mg/dL    Comment: Glucose reference range applies only to samples taken  after fasting for at least 8 hours.  Glucose, capillary     Status: Abnormal   Collection Time: 02/06/21 11:59 AM  Result Value Ref Range   Glucose-Capillary 265 (H) 70 - 99 mg/dL    Comment: Glucose reference range applies only to samples taken after fasting for at least 8 hours.  Glucose, capillary     Status: Abnormal   Collection Time: 02/06/21  5:04 PM  Result Value Ref Range   Glucose-Capillary 294 (H) 70 - 99 mg/dL    Comment: Glucose reference range applies only to samples taken after fasting for at least 8 hours.    Blood Alcohol level:  Lab Results  Component Value Date   ETH <10 01/26/2021   ETH <10 [id number]    Metabolic Disorder Labs: Lab Results  Component Value Date   HGBA1C 11.1 (H) 01/26/2021   MPG 271.87 01/26/2021   MPG 202.99 01/07/2019   No results found for: PROLACTIN Lab Results  Component Value Date   CHOL 140 01/30/2021   TRIG 95 01/30/2021   HDL 49 01/30/2021   CHOLHDL 2.9 01/30/2021   VLDL 19 01/30/2021   LDLCALC 72 01/30/2021   LDLCALC 67 05/20/2020    Physical Findings: AIMS: not yet assessed  Musculoskeletal: Strength & Muscle Tone: within normal limits Gait & Station: normal Patient leans: N/A  Psychiatric Specialty Exam:  Presentation  General Appearance: appropriate for environment Eye Contact:Fair; Fleeting  Speech:Blocked; Garbled  Speech Volume:Normal  Handedness:Right   Mood and Affect  Mood: "fine"  Affect: constricted  Thought Process  Thought Processes: Disorganized; Irrevelant  Descriptions of Associations:Tangential  Orientation: disoriented to month and year Thought Content: frank delusions    History of Schizophrenia/Schizoaffective disorder:Yes  Duration of Psychotic Symptoms:Greater than six months  Hallucinations: denies  Ideas of  Reference:Delusions  Suicidal Thoughts: none reported Homicidal Thoughts: none reported  Sensorium  Memory:Immediate Poor; Recent Poor; Remote  Poor  Judgment:Poor  Insight:Poor   Executive Functions  Concentration:Poor  Attention Span:Poor  Recall:Poor  Fund of Knowledge:Poor  Language:Good   Psychomotor Activity  Psychomotor Activity: normal   Assets  Assets:Communication Skills; Social Support; Resilience   Sleep  Sleep: fair    Physical Exam: Physical Exam Vitals reviewed.  Constitutional:      Appearance: He is not ill-appearing or toxic-appearing.  Pulmonary:     Effort: Pulmonary effort is normal.  Musculoskeletal:        General: Normal range of motion.  Neurological:     General: No focal deficit present.     Mental Status: He is alert.   Review of Systems  Cardiovascular:  Negative for chest pain.  Gastrointestinal:  Negative for diarrhea, nausea and vomiting.  Neurological:  Negative for headaches.   Blood pressure 134/77, pulse 86, temperature 98.8 F (37.1 C), temperature source Oral, resp. rate 18, height 5' 3.5" (1.613 m), weight 91.6 kg, SpO2 97 %. Body mass index is 35.22 kg/m.  Assessment: Schizophrenia HTN T2DM, with insulin dependence   Psychosis, delusions -Continue Haldol 20 mg QHS for psychosis -Add Loxapine 10 mg BID for psychosis, consider increase after 11/12 -Discontinue Zyprexa 5 mg BID for intractable psychosis -Patient is a good candidate for clozapine given refractory psychosis and good outpatient services.  -Will plan for one more trial of antipsychotic, (possibly loxepine, asenapine, or Thorazine) for 4 days and then transition to clozapine. -Continue Cogentin 1 mg BID for EPS prevention -Continue Lithium 450 mg BID for mood stability             -Li level of 0->0.7->0.6->0.66->0.68 over consecutive days  -Repeat Li level and BMP   Agitation med protocol ordered: -Zydis 10 mg q8hr prn for agitation  -Ativan 1 mg once for anxiety and severe agitation -Geodon 20 mg IM once for agitation Will consider changing to haldol PRN   Continue PRN's: Tylenol,  Maalox, Atarax, Milk of Magnesia, Trazodone   Medical Management Covid negative CMP: Cr of 1.45 CBC: unremarkable EtOH: <10 UDS: pending TSH: not collected A1C: 11.1 Lipids: pending   T2DM with insulin dependence Poorly controlled as evidenced by A1C. Home insulin regimen of 10U lantus daily and levemir 20U QHS (?) as well as Humalog 2-10U BID. Fasting CBG of 198 this AM with range up to 349 over the past 24 hrs.  -Increase to 38U of glargine daily -Continue moderate SSI -Continue QHS correction -Continue 12U pranidal coverage  -Continue glipizide   HTN Continue home regimen -Lisinopril 40 mg daily -Amlodipine 10 mg daily -Clonidine 0.2 mg BID   Elevated Cr (resolved) Cr of 1.45 on a baseline of  1-1.2 at admission, likely pre-renal in the context of decreased PO. Cr down to 1.29 then normalized.    Corky Sox, MD 02/06/2021, 6:23 PM

## 2021-02-06 NOTE — Group Note (Signed)
Recreation Therapy Group Note   Group Topic:Other  Group Date: 02/06/2021 Start Time: 1010 End Time: 1039 Facilitators: Caroll Rancher, LRT,CTRS Location: [address] Dayroom  Goal Area(s) Addresses:  Patient will identify positive ways to deal with anxiety. Patient will identify benefits of using coping skills for anxiety post d/c.  Group Description: Introduction to Anxiety.  Patients were to identity what triggers anxiety.  Patients also identified the physical symptoms or thoughts experienced when anxious.  Lastly, patients identify coping skills that can be used to deal with anxiety.     Affect/Mood: N/A   Participation Level: Did not attend    Clinical Observations/Individualized Feedback: Pt did not attend group.    Plan: Continue to engage patient in RT group sessions 2-3x/week.   Caroll Rancher, Antonietta Jewel 02/06/2021 12:42 PM

## 2021-02-06 NOTE — Group Note (Signed)
Topic: Healthy Vs Unhealthy Coping Skills    Due to the acuity and complex discharge plans, group was not held. Patient was provided therapeutic worksheets and asked to meet with CSW as needed.   Ruthann Cancer MSW, LCSW Clincal Social Worker  [facility]

## 2021-02-06 NOTE — [facility] Group Notes (Signed)
[facility] Group Notes:  (Nursing/MHT/Case Management/Adjunct)  Date:  02/06/2021  Time:  3:54 PM  Type of Therapy:   Therapeutic Relaxation group  Participation Level:  Minimal  Participation Quality:  Appropriate  Affect:  Appropriate  Cognitive:  Appropriate and Delusional  Insight:  Improving  Engagement in Group:  Improving  Modes of Intervention:  Activity and Exploration  Summary of Progress/Problems: Soothing sounds and relaxation breathing techniques were done during group along with positive visualization.   Jeni Duling J Jniyah Dantuono 02/06/2021, 3:54 PM

## 2021-02-07 DIAGNOSIS — F200 Paranoid schizophrenia: Secondary | ICD-10-CM

## 2021-02-07 LAB — BASIC METABOLIC PANEL
Anion gap: 8 (ref 5–15)
BUN: 18 mg/dL (ref 6–20)
CO2: 27 mmol/L (ref 22–32)
Calcium: 9.8 mg/dL (ref 8.9–10.3)
Chloride: 100 mmol/L (ref 98–111)
Creatinine, Ser: 1.16 mg/dL (ref 0.61–1.24)
GFR, Estimated: >60 mL/min (ref >60)
Glucose, Bld: 226 mg/dL — ABNORMAL HIGH (ref 70–99)
Potassium: 4.1 mmol/L (ref 3.5–5.1)
Sodium: 135 mmol/L (ref 135–145)

## 2021-02-07 LAB — GLUCOSE, CAPILLARY
Glucose-Capillary: 209 mg/dL — ABNORMAL HIGH (ref 70–99)
Glucose-Capillary: 261 mg/dL — ABNORMAL HIGH (ref 70–99)
Glucose-Capillary: 237 mg/dL — ABNORMAL HIGH (ref 70–99)
Glucose-Capillary: 408 mg/dL — ABNORMAL HIGH (ref 70–99)
Glucose-Capillary: 307 mg/dL — ABNORMAL HIGH (ref 70–99)

## 2021-02-07 LAB — LITHIUM LEVEL: Lithium Lvl: 0.66 mmol/L (ref 0.60–1.20)

## 2021-02-07 MED ORDER — LOXAPINE SUCCINATE 10 MG PO CAPS
10.0000 mg | ORAL_CAPSULE | Freq: Every day | ORAL | Status: DC
  Administered 2021-02-08: 10 mg via ORAL
  Filled 2021-02-07 (×2): qty 1

## 2021-02-07 MED ORDER — LOXAPINE SUCCINATE 10 MG PO CAPS
20.0000 mg | ORAL_CAPSULE | Freq: Every day | ORAL | Status: DC
Start: 2021-02-07 — End: 2021-02-08
  Administered 2021-02-07: 20 mg via ORAL
  Filled 2021-02-07 (×2): qty 2

## 2021-02-07 MED ORDER — INSULIN GLARGINE-YFGN 100 UNIT/ML ~~LOC~~ SOLN
44.0000 [IU] | Freq: Every day | SUBCUTANEOUS | Status: DC
Start: 2021-02-07 — End: 2021-02-08
  Administered 2021-02-07 – 2021-02-08 (×2): 44 [IU] via SUBCUTANEOUS

## 2021-02-07 NOTE — Progress Notes (Signed)
CBG checked and patient is currently 307. NP Sherald Hess. notified of reading. No additional orders received.

## 2021-02-07 NOTE — Group Note (Signed)
  [facility] LCSW Group Therapy Note  Date/Time:  02/07/2021 11:15AM-12:00PM  Type of Therapy and Topic:  Group Therapy:  Feelings About Hospitalization  Participation Level:  Minimal   Description of Group This process group involved patients discussing their feelings related to being hospitalized, as well as the benefits they see to being in the hospital.  These feelings and benefits were itemized.  The group then brainstormed specific ways in which they could seek those same benefits when they discharge and return home.  Therapeutic Goals Patient will identify and describe positive and negative feelings related to hospitalization Patient will verbalize benefits of hospitalization to themselves personally Patients will brainstorm together ways they can obtain similar benefits in the outpatient setting, identify barriers to wellness and possible solutions  Summary of Patient Progress:  The patient introduced himself as "[employer]" and when asked to express his primary feelings about being hospitalized, became angry and said "this is not a hospital, it is my home."  He stated then that it was his mansion.  He later said he does not like some of the staff and that he has "no problem being here" but never let up on his notion that this is his mansion.  He did not participate in a meaningful fashion in the discussion.  Therapeutic Modalities Cognitive Behavioral Therapy Motivational Interviewing    Ambrose Mantle, LCSW 02/07/2021, 3:04 PM

## 2021-02-07 NOTE — [facility] Group Notes (Signed)
Pt didn't attend group. 

## 2021-02-07 NOTE — Progress Notes (Signed)
Patient was asleep at beginning of shift and woke up for snack. He remained in the dayroom for a while before taking his hs meds. He reported to Clinical research associate at medication window that he liked it here. He reported that he needs to go home and get a few thing and will come back. Patient reports that this is his home. He continues to respond to internal stimuli but if asked he will deny it. Safety maintained with 15 min checks.

## 2021-02-07 NOTE — Progress Notes (Signed)
Pt remains disorganized, continues to respond to internal stimuli AEB 2 way conversations loudly at medication window and in hall while pacing. Continues to be easily irritated when redirected to comply with unit routines as in appropriate use of toilets "I didn't block that. Hell, let me go now. I did not do that. I have not use the commode since I've been here. It's someone else in that room". Pt continues to clog his toilet with plastic water bottles, white stero-foam cups; requiring 2 hours of service from maintenance this shift. Also grandiose on interactions believes he's Clint Verner Mould "I will save all of you guys like the good old soldier. I'm Clint Eastwood, y'all are fine here". Denies SI, HI, AVH and pain when assessed but is clearly responding to internal stimuli. Pt tolerates meals and medications well without discomfort.  Q 15 minutes safety checks maintained on and off unit with verbal outburst X1 this shift. Support and reassurance offered to pt. All medications given with verbal education and effects monitored.  Pt continues to need frequent redirections to comply with carb modified appropriate diet and toilet use but to no avail. Easily angered when redirected. Safety maintained.

## 2021-02-07 NOTE — Progress Notes (Addendum)
[facility] MD Progress Note  02/07/2021 4:25 PM MARINO HARGREAVES  MRN:  [id number] Subjective:   Mitchell [name] is a 60 year old male with a past history of schizophrenia presenting under IVC for acute psychosis. He was exhibiting bizarre behavior (e.g. running through the streets naked).   Yesterday the following changes were made to the patient's regimen: -start Loxapine to 10 mg bid   The patient's chart was reviewed and nursing notes were reviewed. Over the past 24 hrs, the patient received no PRN medications for agitation. Per staff the patient was talking with unseen others. The patient's case was discussed in multidisciplinary team meeting.  Staff report the patient has been stuffing towels and water bottles down the toilet.  On interview this morning, the patient appears worse than previous days.  He spontaneously reports that he is [name].  He reports that "Yordany Synder is that guy who cleans the rooms" and says that this person is going to jail.  When asked about stuffing towels and water bottles down the toilet, the patient becomes upset and the volume of his voice increases.  He reports that he has not been doing this, that someone else has been doing it.  He denies suicidal thoughts and auditory/visual hallucinations. He is disoriented to month and year, as previously but knows the city is [place].  Principal Problem: Schizophrenia ([place]) Diagnosis: Principal Problem:   Schizophrenia ([place])  Total Time Spent in Direct Patient Care: I personally spent 30 minutes on the unit in direct patient care. The direct patient care time included face-to-face time with the patient, reviewing the patient's chart, communicating with other professionals, and coordinating care. Greater than 50% of this time was spent in counseling or coordinating care with the patient regarding goals of hospitalization, psycho-education, and discharge planning needs.   Past Psychiatric History:  schizophrenia/schizoaffective disorder  Past Medical History:  Past Medical History:  Diagnosis Date   Bipolar affective disorder ([place])    takes Zyprexa daily   Diabetes mellitus    takes Victoza,Metformin,and Glipizide daily   Hypertension    takes Amlodipine,Lisinopril and Clonidine daily   Hyponatremia    history of   Mental disorder    takes Lithium daily   Schizoaffective disorder    takes Trazodone nightly   Seasonal allergies    takes Claritin daily   Sleep apnea    sleep study >71yrs ago   Stroke [facility])    left arm weakness   Past Surgical History:  Procedure Laterality Date   CATARACT EXTRACTION W/PHACO Right 02/14/2013   Procedure: CATARACT EXTRACTION PHACO AND INTRAOCULAR LENS PLACEMENT ([place]);  Surgeon: Adonis Brook, MD;  Location: [place];  Service: Ophthalmology;  Laterality: Right;   CATARACT EXTRACTION W/PHACO Left 06/13/2013   Procedure: CATARACT EXTRACTION PHACO AND INTRAOCULAR LENS PLACEMENT ([facility]);  Surgeon: Adonis Brook, MD;  Location: [place];  Service: Ophthalmology;  Laterality: Left;   CIRCUMCISION  20 yrs. ago   EYE SURGERY     Family History: History reviewed. No pertinent family history. Family Psychiatric  History: reports an Hx of BPAD Social History:  Social History   Substance and Sexual Activity  Alcohol Use Not Currently     Social History   Substance and Sexual Activity  Drug Use No    Social History   Socioeconomic History   Marital status: Divorced    Spouse name: Not on file   Number of children: Not on file   Years of education: Not on file   Highest  education level: Not on file  Occupational History   Not on file  Tobacco Use   Smoking status: Never   Smokeless tobacco: Never  Vaping Use   Vaping Use: Never used  Substance and Sexual Activity   Alcohol use: Not Currently   Drug use: No   Sexual activity: Yes    Birth control/protection: None  Other Topics Concern   Not on file  Social History Narrative   Not on file    Social Determinants of Health   Financial Resource Strain: Not on file  Food Insecurity: Not on file  Transportation Needs: Not on file  Physical Activity: Not on file  Stress: Not on file  Social Connections: Not on file   Additional Social History:     Sleep: Fair  Appetite:  Fair  Current Medications: Current Facility-Administered Medications  Medication Dose Route Frequency Provider Last Rate Last Admin   acetaminophen (TYLENOL) tablet 650 mg  650 mg Oral Q6H PRN Chalmers Guest, NP       alum & mag hydroxide-simeth (MAALOX/MYLANTA) 200-200-20 MG/5ML suspension 30 mL  30 mL Oral Q4H PRN Chalmers Guest, NP       amLODipine (NORVASC) tablet 10 mg  10 mg Oral Daily Chalmers Guest, NP   10 mg at 02/07/21 0835   aspirin EC tablet 81 mg  81 mg Oral Daily Chalmers Guest, NP   81 mg at 02/07/21 0834   benztropine (COGENTIN) tablet 1 mg  1 mg Oral BID Chalmers Guest, NP   1 mg at 02/07/21 [id number]   cloNIDine (CATAPRES) tablet 0.2 mg  0.2 mg Oral BID Chalmers Guest, NP   0.2 mg at 02/07/21 0835   glipiZIDE (GLUCOTROL XL) 24 hr tablet 10 mg  10 mg Oral Q breakfast Chalmers Guest, NP   10 mg at 02/07/21 0834   haloperidol (HALDOL) tablet 20 mg  20 mg Oral QHS Chalmers Guest, NP   20 mg at 02/06/21 2037   insulin aspart (novoLOG) injection 0-15 Units  0-15 Units Subcutaneous TID WC Hill, Jackie Plum, MD   8 Units at 02/07/21 1257   insulin aspart (novoLOG) injection 0-5 Units  0-5 Units Subcutaneous QHS Chalmers Guest, NP   5 Units at 02/06/21 2037   insulin aspart (novoLOG) injection 12 Units  12 Units Subcutaneous TID WC Corky Sox, MD   12 Units at 02/07/21 1257   insulin glargine-yfgn (SEMGLEE) injection 44 Units  44 Units Subcutaneous Daily Corky Sox, MD   44 Units at 02/07/21 0837   lisinopril (ZESTRIL) tablet 40 mg  40 mg Oral Daily Chalmers Guest, NP   40 mg at 02/07/21 0835   lithium carbonate (ESKALITH) CR tablet 450 mg  450 mg Oral Q12H Chalmers Guest, NP   450 mg at  02/07/21 0834   [START ON 02/08/2021] loxapine (LOXITANE) capsule 10 mg  10 mg Oral Daily Corky Sox, MD       loxapine Gust Rung) capsule 20 mg  20 mg Oral Daily Corky Sox, MD       magnesium hydroxide (MILK OF MAGNESIA) suspension 30 mL  30 mL Oral Daily PRN Chalmers Guest, NP       OLANZapine zydis (ZYPREXA) disintegrating tablet 10 mg  10 mg Oral Q8H PRN Chalmers Guest, NP   10 mg at 02/03/21 2342   And   ziprasidone (GEODON) injection 20 mg  20 mg Intramuscular PRN Chalmers Guest, NP  traZODone (DESYREL) tablet 50 mg  50 mg Oral QHS PRN Novella Olive, NP   50 mg at 02/03/21 2342    Lab Results:  Results for orders placed or performed during the hospital encounter of 01/28/21 (from the past 48 hour(s))  Glucose, capillary     Status: Abnormal   Collection Time: 02/05/21  5:32 PM  Result Value Ref Range   Glucose-Capillary 322 (H) 70 - 99 mg/dL    Comment: Glucose reference range applies only to samples taken after fasting for at least 8 hours.  Glucose, capillary     Status: Abnormal   Collection Time: 02/05/21  8:16 PM  Result Value Ref Range   Glucose-Capillary 315 (H) 70 - 99 mg/dL    Comment: Glucose reference range applies only to samples taken after fasting for at least 8 hours.   Comment 1 Notify RN   Glucose, capillary     Status: Abnormal   Collection Time: 02/06/21  5:46 AM  Result Value Ref Range   Glucose-Capillary 198 (H) 70 - 99 mg/dL    Comment: Glucose reference range applies only to samples taken after fasting for at least 8 hours.  Glucose, capillary     Status: Abnormal   Collection Time: 02/06/21 11:59 AM  Result Value Ref Range   Glucose-Capillary 265 (H) 70 - 99 mg/dL    Comment: Glucose reference range applies only to samples taken after fasting for at least 8 hours.  Glucose, capillary     Status: Abnormal   Collection Time: 02/06/21  5:04 PM  Result Value Ref Range   Glucose-Capillary 294 (H) 70 - 99 mg/dL    Comment: Glucose  reference range applies only to samples taken after fasting for at least 8 hours.  Glucose, capillary     Status: Abnormal   Collection Time: 02/06/21  8:25 PM  Result Value Ref Range   Glucose-Capillary 360 (H) 70 - 99 mg/dL    Comment: Glucose reference range applies only to samples taken after fasting for at least 8 hours.  Glucose, capillary     Status: Abnormal   Collection Time: 02/07/21  6:21 AM  Result Value Ref Range   Glucose-Capillary 209 (H) 70 - 99 mg/dL    Comment: Glucose reference range applies only to samples taken after fasting for at least 8 hours.  Lithium level     Status: None   Collection Time: 02/07/21  6:41 AM  Result Value Ref Range   Lithium Lvl 0.66 0.60 - 1.20 mmol/L    Comment: Performed at [facility], [address] [place], [place] [address]  Basic metabolic panel     Status: Abnormal   Collection Time: 02/07/21  6:41 AM  Result Value Ref Range   Sodium 135 135 - 145 mmol/L   Potassium 4.1 3.5 - 5.1 mmol/L   Chloride 100 98 - 111 mmol/L   CO2 27 22 - 32 mmol/L   Glucose, Bld 226 (H) 70 - 99 mg/dL    Comment: Glucose reference range applies only to samples taken after fasting for at least 8 hours.   BUN 18 6 - 20 mg/dL   Creatinine, Ser 5.63 0.61 - 1.24 mg/dL   Calcium 9.8 8.9 - 14.9 mg/dL   GFR, Estimated >70 >26 mL/min    Comment: (NOTE) Calculated using the CKD-EPI Creatinine Equation (2021)    Anion gap 8 5 - 15    Comment: Performed at [facility], [address] [place], [place] [address]  Glucose, capillary     Status: Abnormal   Collection Time: 02/07/21 12:10 PM  Result Value Ref Range   Glucose-Capillary 261 (H) 70 - 99 mg/dL    Comment: Glucose reference range applies only to samples taken after fasting for at least 8 hours.    Blood Alcohol level:  Lab Results  Component Value Date   ETH <10 01/26/2021   ETH <10 [id number]    Metabolic Disorder Labs: Lab Results  Component Value  Date   HGBA1C 11.1 (H) 01/26/2021   MPG 271.87 01/26/2021   MPG 202.99 01/07/2019   No results found for: PROLACTIN Lab Results  Component Value Date   CHOL 140 01/30/2021   TRIG 95 01/30/2021   HDL 49 01/30/2021   CHOLHDL 2.9 01/30/2021   VLDL 19 01/30/2021   LDLCALC 72 01/30/2021   LDLCALC 67 05/20/2020    Physical Findings: AIMS: not yet assessed  Musculoskeletal: Strength & Muscle Tone: within normal limits Gait & Station: normal Patient leans: N/A  Psychiatric Specialty Exam:  Presentation  General Appearance: appropriate for environment Eye Contact:Fair; Fleeting  Speech:mumbling and rambling quality  Speech Volume:Normal  Handedness:Right   Mood and Affect  Mood: irritable  Affect: constricted, irritable  Thought Process  Thought Processes: Disorganized; Irrevelant  Descriptions of Associations:Tangential  Orientation: disoriented to month and year but oriented to city  Thought Content: He has delusion that he is [name] and a staff member is Marvell Tollefson; denies AVH and will not comment on ideas of reference or first rank symptoms when asked; has been seen by staff responding to internal stimuli   History of Schizophrenia/Schizoaffective disorder:Yes  Duration of Psychotic Symptoms:Greater than six months  Hallucinations: denies  Ideas of Reference:would not answer  Suicidal Thoughts: none reported Homicidal Thoughts: none reported  Sensorium  Memory:Immediate Poor; Recent Poor; Remote Poor  Judgment:Poor  Insight:Poor   Executive Functions  Concentration:Poor  Attention Span:Poor  [place] of Knowledge:Poor  Language:Good   Psychomotor Activity  Psychomotor Activity: normal   Assets  Assets:Communication Skills; Social Support; Resilience   Sleep  Sleep: 6.25 hours   Physical Exam Vitals reviewed.  Constitutional:      Appearance: He is not ill-appearing or toxic-appearing.  Pulmonary:      Effort: Pulmonary effort is normal.  Neurological:     General: No focal deficit present.     Mental Status: He is alert.   Review of Systems  Cardiovascular:  Negative for chest pain.  Gastrointestinal:  Negative for diarrhea, nausea and vomiting.  Neurological:  Negative for headaches.   Blood pressure 134/82, pulse 88, temperature 98.5 F (36.9 C), temperature source Oral, resp. rate 18, height 5' 3.5" (1.613 m), weight 91.6 kg, SpO2 99 %. Body mass index is 35.22 kg/m.  Assessment: Schizophrenia HTN T2DM, with insulin dependence   Schizophrenia by hx (r/o schizoaffective d/o bipolar type) -Continue Haldol 20 mg QHS for psychosis -Increase Loxapine 10 mg AM / 20 mg PM for residual psychosis -Patient is a good candidate for clozapine given refractory psychosis and good outpatient services if he fails Loxapine/Haldol trial -Continue Cogentin 1 mg BID for EPS prevention -Continue Lithium 450 mg BID for mood stability  -Li level of 0.66 on 11/12, Na of 135 on 11/12   Agitation med protocol ordered: -Zydis 10 mg q8hr prn for agitation  -Ativan 1 mg once for anxiety and severe agitation -Geodon 20 mg IM once for agitation Will consider changing to haldol  PRN   Continue PRN's: Tylenol, Maalox, Atarax, Milk of Magnesia, Trazodone   Medical Management Covid negative CMP: Cr of 1.45 CBC: unremarkable EtOH: <10 UDS: pending TSH: not collected A1C: 11.1 Lipids: pending   T2DM with insulin dependence -Increase to 44U of glargine daily starting 11/12 -Continue moderate SSI -Continue QHS correction -Continue 12U pranidal coverage  -Continue glipizide   HTN Continue home regimen -Lisinopril 40 mg daily -Amlodipine 10 mg daily -Clonidine 0.2 mg BID   Elevated Cr (resolved) Cr of 1.45 on a baseline of  1-1.2 at admission, likely pre-renal in the context of decreased PO. Cr down to 1.29 then normalized. Creatinine 1.16 on 11/12   Corky Sox, MD 02/07/2021, 4:25 PM

## 2021-02-07 NOTE — Progress Notes (Signed)
Patients CBG tonight was 409 and NP Eddie N. was notified. Order received to give 5 units of Novolog and recheck in 1 hour.

## 2021-02-07 NOTE — Progress Notes (Signed)
Pt was encouraged but didn't attend orientation/goals group. ?

## 2021-02-08 ENCOUNTER — Ambulatory Visit (HOSPITAL_COMMUNITY)
Admit: 2021-02-08 | Discharge: 2021-02-08 | Disposition: A | Discharge status: Home / Self Care | Payer: Medicare Other | Attending: Nurse Practitioner | Admitting: Nurse Practitioner

## 2021-02-08 LAB — CBC WITH DIFFERENTIAL/PLATELET
Abs Immature Granulocytes: 0.04 10*3/uL (ref 0.00–0.07)
Basophils Absolute: 0 10*3/uL (ref 0.0–0.1)
Basophils Relative: 1 %
Eosinophils Absolute: 0.4 10*3/uL (ref 0.0–0.5)
Eosinophils Relative: 6 %
HCT: 38.5 % — ABNORMAL LOW (ref 39.0–52.0)
Hemoglobin: 12.6 g/dL — ABNORMAL LOW (ref 13.0–17.0)
Immature Granulocytes: 1 %
Lymphocytes Relative: 35 %
Lymphs Abs: 2.2 10*3/uL (ref 0.7–4.0)
MCH: 31 pg (ref 26.0–34.0)
MCHC: 32.7 g/dL (ref 30.0–36.0)
MCV: 94.6 fL (ref 80.0–100.0)
Monocytes Absolute: 0.5 10*3/uL (ref 0.1–1.0)
Monocytes Relative: 8 %
Neutro Abs: 3.2 10*3/uL (ref 1.7–7.7)
Neutrophils Relative %: 49 %
Platelets: 226 10*3/uL (ref 150–400)
RBC: 4.07 MIL/uL — ABNORMAL LOW (ref 4.22–5.81)
RDW: 12.7 % (ref 11.5–15.5)
WBC: 6.4 10*3/uL (ref 4.0–10.5)
nRBC: 0 % (ref 0.0–0.2)

## 2021-02-08 LAB — HEPATIC FUNCTION PANEL
ALT: 69 U/L — ABNORMAL HIGH (ref 0–44)
AST: 56 U/L — ABNORMAL HIGH (ref 15–41)
Albumin: 4 g/dL (ref 3.5–5.0)
Alkaline Phosphatase: 149 U/L — ABNORMAL HIGH (ref 38–126)
Bilirubin, Direct: <0.1 mg/dL (ref 0.0–0.2)
Total Bilirubin: 0.2 mg/dL — ABNORMAL LOW (ref 0.3–1.2)
Total Protein: 7.6 g/dL (ref 6.5–8.1)

## 2021-02-08 LAB — GLUCOSE, CAPILLARY
Glucose-Capillary: 225 mg/dL — ABNORMAL HIGH (ref 70–99)
Glucose-Capillary: 357 mg/dL — ABNORMAL HIGH (ref 70–99)
Glucose-Capillary: 454 mg/dL — ABNORMAL HIGH (ref 70–99)
Glucose-Capillary: 387 mg/dL — ABNORMAL HIGH (ref 70–99)
Glucose-Capillary: 310 mg/dL — ABNORMAL HIGH (ref 70–99)

## 2021-02-08 MED ORDER — INSULIN ASPART 100 UNIT/ML IJ SOLN
15.0000 [IU] | Freq: Three times a day (TID) | INTRAMUSCULAR | Status: DC
  Administered 2021-02-08 – 2021-02-09 (×4): 15 [IU] via SUBCUTANEOUS

## 2021-02-08 MED ORDER — INSULIN ASPART 100 UNIT/ML IJ SOLN
15.0000 [IU] | Freq: Three times a day (TID) | INTRAMUSCULAR | Status: DC
  Administered 2021-02-08: 15 [IU] via SUBCUTANEOUS

## 2021-02-08 MED ORDER — LOXAPINE SUCCINATE 10 MG PO CAPS
20.0000 mg | ORAL_CAPSULE | Freq: Two times a day (BID) | ORAL | Status: DC
  Administered 2021-02-08 – 2021-02-10 (×4): 20 mg via ORAL
  Filled 2021-02-08 (×8): qty 2

## 2021-02-08 MED ORDER — POLYETHYLENE GLYCOL 3350 17 G PO PACK: 17.0000 g | PACK | Freq: Every day | ORAL | Status: DC | PRN

## 2021-02-08 MED ORDER — INSULIN GLARGINE-YFGN 100 UNIT/ML ~~LOC~~ SOLN
50.0000 [IU] | Freq: Every day | SUBCUTANEOUS | Status: DC
  Administered 2021-02-09: 50 [IU] via SUBCUTANEOUS

## 2021-02-08 MED ORDER — INSULIN GLARGINE-YFGN 100 UNIT/ML ~~LOC~~ SOLN
6.0000 [IU] | Freq: Once | SUBCUTANEOUS | Status: AC
  Administered 2021-02-08: 6 [IU] via SUBCUTANEOUS

## 2021-02-08 MED ORDER — DOCUSATE SODIUM 100 MG PO CAPS
100.0000 mg | ORAL_CAPSULE | Freq: Every day | ORAL | Status: DC
  Administered 2021-02-08 – 2021-02-10 (×3): 100 mg via ORAL
  Filled 2021-02-08 (×6): qty 1

## 2021-02-08 MED ORDER — HALOPERIDOL 5 MG PO TABS
10.0000 mg | ORAL_TABLET | Freq: Two times a day (BID) | ORAL | Status: DC
  Administered 2021-02-08 – 2021-02-09 (×2): 10 mg via ORAL
  Filled 2021-02-08 (×4): qty 2

## 2021-02-08 NOTE — Progress Notes (Signed)
Pt was encouraged but didn't attend orientation/goals group. ?

## 2021-02-08 NOTE — Group Note (Signed)
[facility] LCSW Group Therapy Note  Date/Time:  02/08/2021  11:00AM-12:00PM  Type of Therapy and Topic:  Group Therapy:  Music and Mood  Participation Level:  Active   Description of Group: In this process group, members listened to a variety of genres of music and identified that different types of music evoke different responses.  Patients were encouraged to identify music that was soothing for them and music that was energizing for them.  Patients discussed how this knowledge can help with wellness and recovery in various ways including managing depression and anxiety as well as encouraging healthy sleep habits.    Therapeutic Goals: Patients will explore the impact of different varieties of music on mood Patients will verbalize the thoughts they have when listening to different types of music Patients will identify music that is soothing to them as well as music that is energizing to them Patients will discuss how to use this knowledge to assist in maintaining wellness and recovery Patients will explore the use of music as a coping skill  Summary of Patient Progress:  At the beginning of group, patient expressed that he felt very relaxed.  He participated very well, even danced at one point.  He talked quite coherently about the role of faith in his life.  There were times during group that he appeared to be having a conversation with someone who was not there, but it was quiet and inobtrusive.  At the end of group, patient expressed that he felt good.    Therapeutic Modalities: Solution Focused Brief Therapy Activity   Ambrose Mantle, LCSW

## 2021-02-08 NOTE — Progress Notes (Addendum)
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 He was exhibiting bizarre behavior (e.g. running through the streets naked).   Today's assessment :  Patient remains pleasantly confused, disorganized and continues to respond to internal stimuli.  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  Patient is not agitated or aggressive towards staff and peers.  His blood sugar remains elevated and the Diabetic Nurse is managing his sugar.  Patient started Loxpaine and is increased today to 20 mg bid.  His Haldol is divided into am and night dosing.  Noted his abdomen getting big, he denied pain and reported bowel movement this morning.  We have ordered  KUB today to evaluate his abdominal distention.  He caries on conversation with unseen people.  He denied SI/HI but continues to respond to IS. Principal Problem: Schizophrenia ([place]) Diagnosis: Principal Problem:   Schizophrenia ([place])  Total Time Spent in Direct Patient Care: 20 minutes  Past Psychiatric History: schizophrenia/schizoaffective disorder  Past Medical History:  Past Medical History:  Diagnosis Date   Bipolar affective disorder ([place])    takes Zyprexa daily   Diabetes mellitus    takes Victoza,Metformin,and Glipizide daily   Hypertension    takes Amlodipine,Lisinopril and Clonidine daily   Hyponatremia    history of   Mental disorder    takes Lithium daily   Schizoaffective disorder    takes Trazodone nightly   Seasonal allergies    takes Claritin daily   Sleep apnea    sleep study >4yrs ago   Stroke [facility])    left arm weakness   Past Surgical History:  Procedure Laterality Date   CATARACT EXTRACTION W/PHACO Right  02/14/2013   Procedure: CATARACT EXTRACTION PHACO AND INTRAOCULAR LENS PLACEMENT ([place]);  Surgeon: Adonis Brook, MD;  Location: [place];  Service: Ophthalmology;  Laterality: Right;   CATARACT EXTRACTION W/PHACO Left 06/13/2013   Procedure: CATARACT EXTRACTION PHACO AND INTRAOCULAR LENS PLACEMENT ([facility]);  Surgeon: Adonis Brook, MD;  Location: [place];  Service: Ophthalmology;  Laterality: Left;   CIRCUMCISION  20 yrs. ago   EYE SURGERY     Family History: History reviewed. No pertinent family history. Family Psychiatric  History: reports an Hx of BPAD Social History:  Social History   Substance and Sexual Activity  Alcohol Use Not Currently     Social History   Substance and Sexual Activity  Drug Use No    Social History   Socioeconomic History   Marital status: Divorced    Spouse name: Not on file   Number of children: Not on file   Years of education: Not on file   Highest education level: Not on file  Occupational History   Not on file  Tobacco Use   Smoking status: Never   Smokeless tobacco: Never  Vaping Use   Vaping Use: Never used  Substance and Sexual Activity   Alcohol use: Not Currently   Drug use: No   Sexual activity: Yes    Birth control/protection: None  Other Topics Concern   Not on file  Social History Narrative   Not on file   Social Determinants of Health   Financial Resource Strain:  Not on file  Food Insecurity: Not on file  Transportation Needs: Not on file  Physical Activity: Not on file  Stress: Not on file  Social Connections: Not on file   Additional Social History:     Sleep: Fair  Appetite:  Fair  Current Medications: Current Facility-Administered Medications  Medication Dose Route Frequency Provider Last Rate Last Admin   acetaminophen (TYLENOL) tablet 650 mg  650 mg Oral Q6H PRN Chalmers Guest, NP       alum & mag hydroxide-simeth (MAALOX/MYLANTA) 200-200-20 MG/5ML suspension 30 mL  30 mL Oral Q4H PRN Chalmers Guest, NP        amLODipine (NORVASC) tablet 10 mg  10 mg Oral Daily Chalmers Guest, NP   10 mg at 02/08/21 [id number]   aspirin EC tablet 81 mg  81 mg Oral Daily Chalmers Guest, NP   81 mg at 02/08/21 0818   benztropine (COGENTIN) tablet 1 mg  1 mg Oral BID Chalmers Guest, NP   1 mg at 02/08/21 0818   cloNIDine (CATAPRES) tablet 0.2 mg  0.2 mg Oral BID Chalmers Guest, NP   0.2 mg at 02/08/21 0818   glipiZIDE (GLUCOTROL XL) 24 hr tablet 10 mg  10 mg Oral Q breakfast Chalmers Guest, NP   10 mg at 02/08/21 0817   haloperidol (HALDOL) tablet 10 mg  10 mg Oral BID Charmaine Downs C, NP       insulin aspart (novoLOG) injection 0-15 Units  0-15 Units Subcutaneous TID WC Hill, Jackie Plum, MD   5 Units at 02/08/21 [id number]   insulin aspart (novoLOG) injection 0-5 Units  0-5 Units Subcutaneous QHS Chalmers Guest, NP   5 Units at 02/07/21 2103   insulin aspart (novoLOG) injection 12 Units  12 Units Subcutaneous TID WC Corky Sox, MD   12 Units at 02/08/21 0614   insulin glargine-yfgn (SEMGLEE) injection 44 Units  44 Units Subcutaneous Daily Corky Sox, MD   44 Units at 02/08/21 0822   lisinopril (ZESTRIL) tablet 40 mg  40 mg Oral Daily Chalmers Guest, NP   40 mg at 02/08/21 0818   lithium carbonate (ESKALITH) CR tablet 450 mg  450 mg Oral Q12H Chalmers Guest, NP   450 mg at 02/08/21 [id number]   loxapine (LOXITANE) capsule 20 mg  20 mg Oral BID Charmaine Downs C, NP       magnesium hydroxide (MILK OF MAGNESIA) suspension 30 mL  30 mL Oral Daily PRN Chalmers Guest, NP       OLANZapine zydis (ZYPREXA) disintegrating tablet 10 mg  10 mg Oral Q8H PRN Chalmers Guest, NP   10 mg at 02/03/21 2342   And   ziprasidone (GEODON) injection 20 mg  20 mg Intramuscular PRN Chalmers Guest, NP       traZODone (DESYREL) tablet 50 mg  50 mg Oral QHS PRN Chalmers Guest, NP   50 mg at 02/07/21 2030    Lab Results:  Results for orders placed or performed during the hospital encounter of 01/28/21 (from the past 48 hour(s))  Glucose,  capillary     Status: Abnormal   Collection Time: 02/06/21 11:59 AM  Result Value Ref Range   Glucose-Capillary 265 (H) 70 - 99 mg/dL    Comment: Glucose reference range applies only to samples taken after fasting for at least 8 hours.  Glucose, capillary     Status: Abnormal   Collection Time: 02/06/21  5:04  PM  Result Value Ref Range   Glucose-Capillary 294 (H) 70 - 99 mg/dL    Comment: Glucose reference range applies only to samples taken after fasting for at least 8 hours.  Glucose, capillary     Status: Abnormal   Collection Time: 02/06/21  8:25 PM  Result Value Ref Range   Glucose-Capillary 360 (H) 70 - 99 mg/dL    Comment: Glucose reference range applies only to samples taken after fasting for at least 8 hours.  Glucose, capillary     Status: Abnormal   Collection Time: 02/07/21  6:21 AM  Result Value Ref Range   Glucose-Capillary 209 (H) 70 - 99 mg/dL    Comment: Glucose reference range applies only to samples taken after fasting for at least 8 hours.  Lithium level     Status: None   Collection Time: 02/07/21  6:41 AM  Result Value Ref Range   Lithium Lvl 0.66 0.60 - 1.20 mmol/L    Comment: Performed at [facility], [place] [address], [place], [place] [id number]  Basic metabolic panel     Status: Abnormal   Collection Time: 02/07/21  6:41 AM  Result Value Ref Range   Sodium 135 135 - 145 mmol/L   Potassium 4.1 3.5 - 5.1 mmol/L   Chloride 100 98 - 111 mmol/L   CO2 27 22 - 32 mmol/L   Glucose, Bld 226 (H) 70 - 99 mg/dL    Comment: Glucose reference range applies only to samples taken after fasting for at least 8 hours.   BUN 18 6 - 20 mg/dL   Creatinine, Ser 1.16 0.61 - 1.24 mg/dL   Calcium 9.8 8.9 - 10.3 mg/dL   GFR, Estimated >60 >60 mL/min    Comment: (NOTE) Calculated using the CKD-EPI Creatinine Equation (2021)    Anion gap 8 5 - 15    Comment: Performed at [facility], [place] [address], [place], [place] [address]  Glucose,  capillary     Status: Abnormal   Collection Time: 02/07/21 12:10 PM  Result Value Ref Range   Glucose-Capillary 261 (H) 70 - 99 mg/dL    Comment: Glucose reference range applies only to samples taken after fasting for at least 8 hours.  Glucose, capillary     Status: Abnormal   Collection Time: 02/07/21  5:10 PM  Result Value Ref Range   Glucose-Capillary 237 (H) 70 - 99 mg/dL    Comment: Glucose reference range applies only to samples taken after fasting for at least 8 hours.  Glucose, capillary     Status: Abnormal   Collection Time: 02/07/21  8:11 PM  Result Value Ref Range   Glucose-Capillary 408 (H) 70 - 99 mg/dL    Comment: Glucose reference range applies only to samples taken after fasting for at least 8 hours.  Glucose, capillary     Status: Abnormal   Collection Time: 02/07/21  9:48 PM  Result Value Ref Range   Glucose-Capillary 307 (H) 70 - 99 mg/dL    Comment: Glucose reference range applies only to samples taken after fasting for at least 8 hours.  Glucose, capillary     Status: Abnormal   Collection Time: 02/08/21  6:04 AM  Result Value Ref Range   Glucose-Capillary 225 (H) 70 - 99 mg/dL    Comment: Glucose reference range applies only to samples taken after fasting for at least 8 hours.    Blood Alcohol level:  Lab Results  Component Value Date  ETH <10 01/26/2021   ETH <10 [id number]    Metabolic Disorder Labs: Lab Results  Component Value Date   HGBA1C 11.1 (H) 01/26/2021   MPG 271.87 01/26/2021   MPG 202.99 01/07/2019   No results found for: PROLACTIN Lab Results  Component Value Date   CHOL 140 01/30/2021   TRIG 95 01/30/2021   HDL 49 01/30/2021   CHOLHDL 2.9 01/30/2021   VLDL 19 01/30/2021   LDLCALC 72 01/30/2021   LDLCALC 67 05/20/2020    Physical Findings: AIMS: not yet assessed  Musculoskeletal: Strength & Muscle Tone: within normal limits Gait & Station: normal Patient leans: N/A  Psychiatric Specialty Exam:  Presentation   General Appearance: appropriate for environment Eye Contact:Fair; Fleeting  Speech:mumbling and rambling quality  Speech Volume:Normal  Handedness:Right   Mood and Affect  Mood: irritable  Affect: constricted, irritable  Thought Process  Thought Processes: Disorganized; Irrevelant  Descriptions of Associations:Tangential  Orientation: disoriented to month and year but oriented to city  Thought Content: He has delusion that he is [name] and a staff member is Narek Leno; denies AVH and will not comment on ideas of reference or first rank symptoms when asked; has been seen by staff responding to internal stimuli   History of Schizophrenia/Schizoaffective disorder:Yes  Duration of Psychotic Symptoms:Greater than six months  Hallucinations: denies  Ideas of Reference:would not answer  Suicidal Thoughts: none reported Homicidal Thoughts: none reported  Sensorium  Memory:Immediate Poor; Recent Poor; Remote Poor  Judgment:Poor  Insight:Poor   Executive Functions  Concentration:Poor  Attention Span:Poor  [place] of Knowledge:Poor  Language:Good   Psychomotor Activity  Psychomotor Activity: normal   Assets  Assets:Communication Skills; Social Support; Resilience   Sleep  Sleep: 6.25 hours   Physical Exam Vitals reviewed.  Constitutional:      Appearance: He is not ill-appearing or toxic-appearing.  Pulmonary:     Effort: Pulmonary effort is normal.  Neurological:     General: No focal deficit present.     Mental Status: He is alert.   Review of Systems  Cardiovascular:  Negative for chest pain.  Gastrointestinal:  Negative for diarrhea, nausea and vomiting.  Neurological:  Negative for headaches.   Blood pressure (!) 151/96, pulse 96, temperature 98.6 F (37 C), temperature source Oral, resp. rate 16, height 5' 3.5" (1.613 m), weight 91.6 kg, SpO2 98 %. Body mass index is 35.22 kg/m.  Assessment: Schizophrenia HTN T2DM,  with insulin dependence   Schizophrenia by hx (r/o schizoaffective d/o bipolar type) -Change Haldol to 10 mg bid  for psychosis -Change Loxapine to 20 mg  to bid for residual psychosis -Patient is a good candidate for clozapine given refractory psychosis and good outpatient services if he fails Loxapine/Haldol trial -Continue Cogentin 1 mg BID for EPS prevention -Continue Lithium 450 mg BID for mood stability  -Li level of 0.66 on 11/12, Na of 135 on 11/12   Agitation med protocol ordered: -Zydis 10 mg q8hr prn for agitation  -Ativan 1 mg once for anxiety and severe agitation -Geodon 20 mg IM once for agitation Will consider changing to haldol PRN   Continue PRN's: Tylenol, Maalox, Atarax, Milk of Magnesia, Trazodone   Medical Management Covid negative CMP: Cr of 1.45 CBC: unremarkable EtOH: <10 UDS: pending TSH: not collected A1C: 11.1 Lipids: pending   T2DM with insulin dependence -Increase to 44U of glargine daily starting 11/12 -Continue moderate SSI -Continue QHS correction -Continue 12U pranidal coverage  -Continue glipizide   HTN Continue  home regimen -Lisinopril 40 mg daily -Amlodipine 10 mg daily -Clonidine 0.2 mg BID   Elevated Cr (resolved) Cr of 1.45 on a baseline of  1-1.2 at admission, likely pre-renal in the context of decreased PO. Cr down to 1.29 then normalized. Creatinine 1.16 on 11/12   Abdominal distension -- Checking KUB today and CMP and CBC ordered - may need abdominal u/s -- BMP 11/12 WNL except for glucose 226  Earney Navy, NP-PMHNP-BC 02/08/2021, 10:45 AM  I have reviewed the patient's chart and discussed the case with the APP. I agree with the assessment and plan of care as documented in the APP's note.   Bartholomew Crews, MD, [facility]ene Skeen

## 2021-02-08 NOTE — [facility] Group Notes (Signed)
Adult Psychoeducational Group Not Date:  02/08/2021 Time:  0900-1045 Group Topic/Focus: PROGRESSIVE RELAXATION. A group where deep breathing is taught and tensing and relaxation muscle groups is used. Imagery is used as well.  Pts are asked to imagine 3 pillars that hold them up when they are not able to hold themselves up.  Participation Level:  Active  Participation Quality:  Appropriate  Affect:  Appropriate  Cognitive:  Oriented  Insight: Improving  Engagement in Group:  Engaged  Modes of Intervention:  Activity, Discussion, Education, and Support  Additional Comments:  rates energy at a 10/10. God, his children and their mothers  Dione Housekeeper

## 2021-02-08 NOTE — Progress Notes (Signed)
Pt was encouraged but didn't attend psycho-ed group.  

## 2021-02-08 NOTE — Progress Notes (Addendum)
   02/08/21 0828  Psych Admission Type (Psych Patients Only)  Admission Status Involuntary  Psychosocial Assessment  Patient Complaints None  Eye Contact Fair  Facial Expression Anxious;Animated  Affect Preoccupied;Appropriate to circumstance  Speech Tangential;Logical/coherent  Interaction Minimal;Cautious  Motor Activity Slow  Appearance/Hygiene Layered clothes;In scrubs  Behavior Characteristics Cooperative  Mood Suspicious  Aggressive Behavior  Effect No apparent injury  Thought Process  Coherency Disorganized;Tangential  Content Preoccupation  Delusions Paranoid  Perception Derealization;Hallucinations  Hallucination Auditory (Pt is actively responding to internal stimuli. Talking loudly to self at medication window.)  Judgment Impaired  Confusion Moderate  Danger to Self  Current suicidal ideation? Denies  Danger to Others  Danger to Others None reported or observed   D:  Patient denies SI/HI/AVH. Pt. Was talking to himself up at the med window. Pt. Was out in open areas. Pt. Sent to [facility] for x-ray of abdomen.  A:  Patient took scheduled medicine.  Support and encouragement provided Routine safety checks conducted every 15 minutes. Patient  Informed to notify staff with any concerns.   R:  Safety maintained.   Pt.'s ls730 CBG was 454 Dr. Was present. Pt.'s last CBG 387.

## 2021-02-08 NOTE — Progress Notes (Signed)
Patient continues to respond to internal stimuli. He has been up in the dayroom off and on watching the game. He was compliant with his medications. CBG was extremely high on day shift but is slowly decreasing. He has shown no signs or symptoms related with elevated CBG and reported to writer that he felt fine when asked. Safety maintained with 15 min checks.

## 2021-02-08 NOTE — Progress Notes (Signed)
Inpatient Diabetes Program Recommendations  AACE/ADA: New Consensus Statement on Inpatient Glycemic Control (2015)  Target Ranges:  Prepandial:   less than 140 mg/dL      Peak postprandial:   less than 180 mg/dL (1-2 hours)      Critically ill patients:  140 - 180 mg/dL  Results for GEREMY, RISTER (MRN [id number]) as of 02/08/2021 08:36  Ref. Range 02/07/2021 06:21 02/07/2021 12:10 02/07/2021 17:10 02/07/2021 20:11 02/07/2021 21:48  Glucose-Capillary Latest Ref Range: 70 - 99 mg/dL 779 (H)  17 units Novolog  44 units Semglee @0837   261 (H)  20 units Novolog  237 (H)  17 units Novolog  408 (H)  5 units Novolog  307 (H)  Results for HAROLD, MONCUS (MRN [name]) as of 02/08/2021 08:36  Ref. Range 02/08/2021 06:04  Glucose-Capillary Latest Ref Range: 70 - 99 mg/dL 02/10/2021 (H)  17 units Novolog  44 units Semglee @0822       Home DM Meds: Semglee 10 units daily     Novolog 10 units TID     Glipizide 10 mg QAM  Current Orders: Semglee 44 units daily      Novolog 12 units TID with meals      Glipizide 10 mg daily      Novolog Moderate Correction Scale/ SSI (0-15 units) TID AC + HS    Note Semglee increased to 44 units daily yesterday AM  CBG 225 this AM  Severely elevated at bedtime last night    MD- Please consider:  1. Increase Semglee further to 50 units daily Since 44 unit dose already given this AM, please also give pt an additional 6 units Semglee this AM  2. Increase Novolog Meal Coverage to 15 units TID    --Will follow patient during hospitalization--  300 RN, MSN, CDE Diabetes Coordinator Inpatient Glycemic Control Team Team Pager: [phone] (8a-5p)

## 2021-02-09 ENCOUNTER — Encounter (HOSPITAL_COMMUNITY): Payer: Self-pay

## 2021-02-09 ENCOUNTER — Ambulatory Visit (HOSPITAL_BASED_OUTPATIENT_CLINIC_OR_DEPARTMENT_OTHER): Payer: Medicare Other

## 2021-02-09 DIAGNOSIS — I10 Essential (primary) hypertension: Secondary | ICD-10-CM | POA: Diagnosis not present

## 2021-02-09 LAB — GLUCOSE, CAPILLARY
Glucose-Capillary: 155 mg/dL — ABNORMAL HIGH (ref 70–99)
Glucose-Capillary: 275 mg/dL — ABNORMAL HIGH (ref 70–99)
Glucose-Capillary: 299 mg/dL — ABNORMAL HIGH (ref 70–99)
Glucose-Capillary: 275 mg/dL — ABNORMAL HIGH (ref 70–99)

## 2021-02-09 LAB — ECHOCARDIOGRAM COMPLETE
Area-P 1/2: 3.83 cm2
Calc EF: 59.9 %
Height: 63.5 in
S' Lateral: 2.8 cm
Single Plane A2C EF: 59.4 %
Single Plane A4C EF: 59.9 %
Weight: 3232 oz

## 2021-02-09 MED ORDER — INSULIN GLARGINE-YFGN 100 UNIT/ML ~~LOC~~ SOLN
37.0000 [IU] | Freq: Every day | SUBCUTANEOUS | Status: DC
Start: 2021-02-10 — End: 2021-02-10
  Administered 2021-02-10: 37 [IU] via SUBCUTANEOUS

## 2021-02-09 MED ORDER — INSULIN ASPART 100 UNIT/ML IJ SOLN
15.0000 [IU] | Freq: Three times a day (TID) | INTRAMUSCULAR | Status: DC
  Administered 2021-02-10 – 2021-02-13 (×10): 15 [IU] via SUBCUTANEOUS

## 2021-02-09 MED ORDER — WHITE PETROLATUM EX OINT
TOPICAL_OINTMENT | CUTANEOUS | Status: AC
  Administered 2021-02-09: 1
  Filled 2021-02-09: qty 5

## 2021-02-09 MED ORDER — HALOPERIDOL 5 MG PO TABS
15.0000 mg | ORAL_TABLET | Freq: Two times a day (BID) | ORAL | Status: DC
  Administered 2021-02-09 – 2021-02-16 (×14): 15 mg via ORAL
  Filled 2021-02-09 (×19): qty 3

## 2021-02-09 NOTE — Progress Notes (Signed)
[facility] MD Progress Note  02/09/2021 1:49 PM Mitchell [name]  MRN:  [id number] Subjective:   Mitchell [name] is a 60 year old male with a past history of schizophrenia presenting under IVC for acute psychosis. He was exhibiting bizarre behavior (e.g. running through the streets naked).   Yesterday the following changes were made to the patient's regimen: -Increase Loxapine to 20 mg AM / 20 mg PM starting 11/14 -Change Haldol from 20 mg QHS to 10 mg BID  The patient's chart was reviewed and nursing notes were reviewed. Over the past 24 hrs, the patient received no PRN medications for agitation. Per staff the patient was talking with unseen others. The patient's case was discussed in multidisciplinary team meeting.  Staff report the patient has been talking with unseen others frequently.   On interview this morning, the patient appears delusional.  He reports that he is the president and later on that he is blind, despite substantial evidence to the contrary. He says "I been playing like crazy". He reports that his mood is good and his affect appears constricted. He is disoriented to month and year. He is asked about medication changes and he is amenable to this and further workup to start clozapine.   Principal Problem: Schizophrenia ([place]) Diagnosis: Principal Problem:   Schizophrenia ([place])  Total Time Spent in Direct Patient Care: I personally spent 30 minutes on the unit in direct patient care. The direct patient care time included face-to-face time with the patient, reviewing the patient's chart, communicating with other professionals, and coordinating care. Greater than 50% of this time was spent in counseling or coordinating care with the patient regarding goals of hospitalization, psycho-education, and discharge planning needs.   Past Psychiatric History: schizophrenia/schizoaffective disorder  Past Medical History:  Past Medical History:  Diagnosis Date   Bipolar affective disorder ([place])     takes Zyprexa daily   Diabetes mellitus    takes Victoza,Metformin,and Glipizide daily   Hypertension    takes Amlodipine,Lisinopril and Clonidine daily   Hyponatremia    history of   Mental disorder    takes Lithium daily   Schizoaffective disorder    takes Trazodone nightly   Seasonal allergies    takes Claritin daily   Sleep apnea    sleep study >65yr ago   Stroke ([facility]    left arm weakness   Past Surgical History:  Procedure Laterality Date   CATARACT EXTRACTION W/PHACO Right 02/14/2013   Procedure: CATARACT EXTRACTION PHACO AND INTRAOCULAR LENS PLACEMENT ([place];  Surgeon: GAdonis Brook MD;  [place] [facility]  Service: Ophthalmology;  Laterality: Right;   CATARACT EXTRACTION W/PHACO Left 06/13/2013   Procedure: CATARACT EXTRACTION PHACO AND INTRAOCULAR LENS PLACEMENT (I[facility]);  Surgeon: GAdonis Brook MD;  [place] [facility]  Service: Ophthalmology;  Laterality: Left;   CIRCUMCISION  20 yrs. ago   EYE SURGERY     Family History: History reviewed. No pertinent family history. Family Psychiatric  History: reports an Hx of BPAD Social History:  Social History   Substance and Sexual Activity  Alcohol Use Not Currently     Social History   Substance and Sexual Activity  Drug Use No    Social History   Socioeconomic History   Marital status: Divorced    Spouse name: Not on file   Number of children: Not on file   Years of education: Not on file   Highest education level: Not on file  Occupational History   Not on file  Tobacco Use  Smoking status: Never   Smokeless tobacco: Never  Vaping Use   Vaping Use: Never used  Substance and Sexual Activity   Alcohol use: Not Currently   Drug use: No   Sexual activity: Yes    Birth control/protection: None  Other Topics Concern   Not on file  Social History Narrative   Not on file   Social Determinants of Health   Financial Resource Strain: Not on file  Food Insecurity: Not on file  Transportation Needs: Not on file   Physical Activity: Not on file  Stress: Not on file  Social Connections: Not on file   Additional Social History:     Sleep: Fair  Appetite:  Fair  Current Medications: Current Facility-Administered Medications  Medication Dose Route Frequency Provider Last Rate Last Admin   acetaminophen (TYLENOL) tablet 650 mg  650 mg Oral Q6H PRN Chalmers Guest, NP       alum & mag hydroxide-simeth (MAALOX/MYLANTA) 200-200-20 MG/5ML suspension 30 mL  30 mL Oral Q4H PRN Chalmers Guest, NP       amLODipine (NORVASC) tablet 10 mg  10 mg Oral Daily Chalmers Guest, NP   10 mg at 02/09/21 0758   aspirin EC tablet 81 mg  81 mg Oral Daily Chalmers Guest, NP   81 mg at 02/09/21 0758   benztropine (COGENTIN) tablet 1 mg  1 mg Oral BID Chalmers Guest, NP   1 mg at 02/09/21 0758   cloNIDine (CATAPRES) tablet 0.2 mg  0.2 mg Oral BID Chalmers Guest, NP   0.2 mg at 02/09/21 0758   docusate sodium (COLACE) capsule 100 mg  100 mg Oral Daily Ethelene Hal, NP   100 mg at 02/09/21 0758   glipiZIDE (GLUCOTROL XL) 24 hr tablet 10 mg  10 mg Oral Q breakfast Chalmers Guest, NP   10 mg at 02/09/21 1655   haloperidol (HALDOL) tablet 15 mg  15 mg Oral BID Massengill, Ovid Curd, MD       insulin aspart (novoLOG) injection 0-15 Units  0-15 Units Subcutaneous TID WC Hill, Jackie Plum, MD   8 Units at 02/09/21 1304   insulin aspart (novoLOG) injection 0-5 Units  0-5 Units Subcutaneous QHS Chalmers Guest, NP   4 Units at 02/08/21 2123   insulin aspart (novoLOG) injection 15 Units  15 Units Subcutaneous TID WC Ethelene Hal, NP   15 Units at 02/09/21 1303   insulin glargine-yfgn (SEMGLEE) injection 50 Units  50 Units Subcutaneous Daily Ethelene Hal, NP   50 Units at 02/09/21 0800   lisinopril (ZESTRIL) tablet 40 mg  40 mg Oral Daily Chalmers Guest, NP   40 mg at 02/09/21 0757   lithium carbonate (ESKALITH) CR tablet 450 mg  450 mg Oral Q12H Chalmers Guest, NP   450 mg at 02/09/21 0756   loxapine  (LOXITANE) capsule 20 mg  20 mg Oral BID Charmaine Downs C, NP   20 mg at 02/09/21 0757   magnesium hydroxide (MILK OF MAGNESIA) suspension 30 mL  30 mL Oral Daily PRN Chalmers Guest, NP       OLANZapine zydis (ZYPREXA) disintegrating tablet 10 mg  10 mg Oral Q8H PRN Chalmers Guest, NP   10 mg at 02/03/21 2342   And   ziprasidone (GEODON) injection 20 mg  20 mg Intramuscular PRN Chalmers Guest, NP       polyethylene glycol (MIRALAX / GLYCOLAX) packet 17 g  17 g Oral Daily PRN Ethelene Hal, NP       traZODone (DESYREL) tablet 50 mg  50 mg Oral QHS PRN Chalmers Guest, NP   50 mg at 02/08/21 2126    Lab Results:  Results for orders placed or performed during the hospital encounter of 01/28/21 (from the past 48 hour(s))  Glucose, capillary     Status: Abnormal   Collection Time: 02/07/21  5:10 PM  Result Value Ref Range   Glucose-Capillary 237 (H) 70 - 99 mg/dL    Comment: Glucose reference range applies only to samples taken after fasting for at least 8 hours.  Glucose, capillary     Status: Abnormal   Collection Time: 02/07/21  8:11 PM  Result Value Ref Range   Glucose-Capillary 408 (H) 70 - 99 mg/dL    Comment: Glucose reference range applies only to samples taken after fasting for at least 8 hours.  Glucose, capillary     Status: Abnormal   Collection Time: 02/07/21  9:48 PM  Result Value Ref Range   Glucose-Capillary 307 (H) 70 - 99 mg/dL    Comment: Glucose reference range applies only to samples taken after fasting for at least 8 hours.  Glucose, capillary     Status: Abnormal   Collection Time: 02/08/21  6:04 AM  Result Value Ref Range   Glucose-Capillary 225 (H) 70 - 99 mg/dL    Comment: Glucose reference range applies only to samples taken after fasting for at least 8 hours.  Glucose, capillary     Status: Abnormal   Collection Time: 02/08/21 12:06 PM  Result Value Ref Range   Glucose-Capillary 357 (H) 70 - 99 mg/dL    Comment: Glucose reference range applies only  to samples taken after fasting for at least 8 hours.  Glucose, capillary     Status: Abnormal   Collection Time: 02/08/21  5:13 PM  Result Value Ref Range   Glucose-Capillary 454 (H) 70 - 99 mg/dL    Comment: Glucose reference range applies only to samples taken after fasting for at least 8 hours.  CBC with Differential/Platelet     Status: Abnormal   Collection Time: 02/08/21  6:50 PM  Result Value Ref Range   WBC 6.4 4.0 - 10.5 K/uL   RBC 4.07 (L) 4.22 - 5.81 MIL/uL   Hemoglobin 12.6 (L) 13.0 - 17.0 g/dL   HCT 38.5 (L) 39.0 - 52.0 %   MCV 94.6 80.0 - 100.0 fL   MCH 31.0 26.0 - 34.0 pg   MCHC 32.7 30.0 - 36.0 g/dL   RDW 12.7 11.5 - 15.5 %   Platelets 226 150 - 400 K/uL   nRBC 0.0 0.0 - 0.2 %   Neutrophils Relative % 49 %   Neutro Abs 3.2 1.7 - 7.7 K/uL   Lymphocytes Relative 35 %   Lymphs Abs 2.2 0.7 - 4.0 K/uL   Monocytes Relative 8 %   Monocytes Absolute 0.5 0.1 - 1.0 K/uL   Eosinophils Relative 6 %   Eosinophils Absolute 0.4 0.0 - 0.5 K/uL   Basophils Relative 1 %   Basophils Absolute 0.0 0.0 - 0.1 K/uL   Immature Granulocytes 1 %   Abs Immature Granulocytes 0.04 0.00 - 0.07 K/uL    Comment: Performed at [facility], [place] [address] [place], [place] [address]  Hepatic function panel     Status: Abnormal   Collection Time: 02/08/21  6:50 PM  Result Value Ref Range  Total Protein 7.6 6.5 - 8.1 g/dL   Albumin 4.0 3.5 - 5.0 g/dL   AST 56 (H) 15 - 41 U/L   ALT 69 (H) 0 - 44 U/L   Alkaline Phosphatase 149 (H) 38 - 126 U/L   Total Bilirubin 0.2 (L) 0.3 - 1.2 mg/dL   Bilirubin, Direct <0.1 0.0 - 0.2 mg/dL   Indirect Bilirubin NOT CALCULATED 0.3 - 0.9 mg/dL    Comment: Performed at [facility], [place] [address], [place], [place] [address]  Glucose, capillary     Status: Abnormal   Collection Time: 02/08/21  7:22 PM  Result Value Ref Range   Glucose-Capillary 387 (H) 70 - 99 mg/dL    Comment: Glucose reference range applies only to  samples taken after fasting for at least 8 hours.  Glucose, capillary     Status: Abnormal   Collection Time: 02/08/21  9:20 PM  Result Value Ref Range   Glucose-Capillary 310 (H) 70 - 99 mg/dL    Comment: Glucose reference range applies only to samples taken after fasting for at least 8 hours.  Glucose, capillary     Status: Abnormal   Collection Time: 02/09/21  5:32 AM  Result Value Ref Range   Glucose-Capillary 155 (H) 70 - 99 mg/dL    Comment: Glucose reference range applies only to samples taken after fasting for at least 8 hours.   Comment 1 Notify RN   Glucose, capillary     Status: Abnormal   Collection Time: 02/09/21 12:20 PM  Result Value Ref Range   Glucose-Capillary 275 (H) 70 - 99 mg/dL    Comment: Glucose reference range applies only to samples taken after fasting for at least 8 hours.    Blood Alcohol level:  Lab Results  Component Value Date   ETH <10 01/26/2021   ETH <10 03/54/6568    Metabolic Disorder Labs: Lab Results  Component Value Date   HGBA1C 11.1 (H) 01/26/2021   MPG 271.87 01/26/2021   MPG 202.99 01/07/2019   No results found for: PROLACTIN Lab Results  Component Value Date   CHOL 140 01/30/2021   TRIG 95 01/30/2021   HDL 49 01/30/2021   CHOLHDL 2.9 01/30/2021   VLDL 19 01/30/2021   LDLCALC 72 01/30/2021   LDLCALC 67 05/20/2020    Physical Findings: AIMS: not yet assessed  Musculoskeletal: Strength & Muscle Tone: within normal limits Gait & Station: normal Patient leans: N/A  Psychiatric Specialty Exam:  Presentation  General Appearance: appropriate for environment Eye Contact:Fair; Fleeting  Speech:mumbling and rambling quality  Speech Volume:Normal  Handedness:Right   Mood and Affect  Mood: "good"  Affect: constricted, irritable  Thought Process  Thought Processes: Disorganized; Irrevelant  Descriptions of Associations:Tangential  Orientation: disoriented to month and year  Thought Content: frank  delusions  History of Schizophrenia/Schizoaffective disorder:Yes  Duration of Psychotic Symptoms:Greater than six months  Hallucinations: denies  Ideas of Reference: none reported  Suicidal Thoughts: none reported Homicidal Thoughts: none reported  Sensorium  Memory:Immediate Poor; Recent Poor; Remote Poor  Judgment:Poor  Insight:Poor   Executive Functions  Concentration:Poor  Attention Span:Poor  Recall:Poor  Fund of Knowledge:Poor  Language:Good   Psychomotor Activity  Psychomotor Activity: normal   Assets  Assets:Communication Skills; Social Support; Resilience   Sleep  Sleep: 6.25 hours   Physical Exam Vitals reviewed.  Constitutional:      Appearance: He is not ill-appearing or toxic-appearing.  Pulmonary:     Effort: Pulmonary effort is normal.  Abdominal:  Comments: Protuberant and firm abdomen,  Normal bowel sounds, non-tender to palpation No fluid wave Sclerae non-icteric, no asterixis  Neurological:     General: No focal deficit present.     Mental Status: He is alert.   Review of Systems  Cardiovascular:  Negative for chest pain.  Gastrointestinal:  Negative for diarrhea, nausea and vomiting.  Neurological:  Negative for headaches.   Blood pressure (!) 140/93, pulse 88, temperature 98.1 F (36.7 C), temperature source Oral, resp. rate 16, height 5' 3.5" (1.613 m), weight 91.6 kg, SpO2 99 %. Body mass index is 35.22 kg/m.  Assessment: Schizophrenia HTN T2DM, with insulin dependence   Schizophrenia by hx (r/o schizoaffective d/o bipolar type) -Continue Haldol 10 mg BID for psychosis (home med) -Continue Loxapine 20 mg AM / 20 mg PM for psychosis -Patient is a good candidate for clozapine given refractory psychosis and good outpatient services if he fails Loxapine/Haldol trial -Continue Cogentin 1 mg BID for EPS prevention -Continue Lithium 450 mg BID for mood stability  -Li level of 0.66 on 11/12, Na of 135 on 11/12 -Start  workup for clozapine  -CBC for ANC  -ESR, CRP  -Echo given cardiac risk factors  -EKG: NSR Qtc 446  -Troponin  Agitation med protocol ordered: -Zydis 10 mg q8hr prn for agitation  -Ativan 1 mg once for anxiety and severe agitation -Geodon 20 mg IM once for agitation Will consider changing to haldol PRN   Continue PRN's: Tylenol, Maalox, Atarax, Milk of Magnesia, Trazodone   Medical Management Covid negative CMP: Cr of 1.45 CBC: unremarkable EtOH: <10 UDS: negative TSH: not collected A1C: 11.1 Lipids: WNL  ABD distention, transaminitis According staff this has increased since admission. KUB shows non-obstructive gas pattern with no obvious stool burden. ABD exam benign except for protuberant abdomen. LFTs slightly elevated with AST/ALT of 59/69. Concern for ascites despite negative fluid wave. -Repeat CMP, consider hepatitis panel -Ammonia -BNP -Lipase -RUQ [place] with ascites search (to be performed tomorrow, pt must be NPO)   T2DM with insulin dependence Fasting CBG of 155 this AM, highest CBG of 454 yesterday.  -Increase glargine to 50U -Continue moderate SSI -Continue QHS correction -Continue 15U pranidal coverage  -Continue glipizide   HTN Continue home regimen -Lisinopril 40 mg daily -Amlodipine 10 mg daily -Clonidine 0.2 mg BID   Elevated Cr (resolved) Cr of 1.45 on a baseline of  1-1.2 at admission, likely pre-renal in the context of decreased PO. Cr down to 1.29 then normalized. Creatinine 1.16 on 11/12   Corky Sox, MD 02/09/2021, 1:49 PM

## 2021-02-09 NOTE — [facility] Group Notes (Signed)
Patient was encouraged to attend goals group but did not attend. Patient did not give a goal on daily sheet. Patient checked they are having thoughts of suicide/self harm.

## 2021-02-09 NOTE — BH IP Treatment Plan (Signed)
Interdisciplinary Treatment and Diagnostic Plan Update  02/09/2021 Time of Session: 11:15am RODRICKUS MIN MRN: [id number]  Principal Diagnosis: Schizophrenia [facility])  Secondary Diagnoses: Principal Problem:   Schizophrenia (HCC)   Current Medications:  Current Facility-Administered Medications  Medication Dose Route Frequency Provider Last Rate Last Admin   acetaminophen (TYLENOL) tablet 650 mg  650 mg Oral Q6H PRN Novella Olive, NP       alum & mag hydroxide-simeth (MAALOX/MYLANTA) 200-200-20 MG/5ML suspension 30 mL  30 mL Oral Q4H PRN Novella Olive, NP       amLODipine (NORVASC) tablet 10 mg  10 mg Oral Daily Novella Olive, NP   10 mg at 02/09/21 0758   aspirin EC tablet 81 mg  81 mg Oral Daily Novella Olive, NP   81 mg at 02/09/21 0758   benztropine (COGENTIN) tablet 1 mg  1 mg Oral BID Novella Olive, NP   1 mg at 02/09/21 8416   cloNIDine (CATAPRES) tablet 0.2 mg  0.2 mg Oral BID Novella Olive, NP   0.2 mg at 02/09/21 0758   docusate sodium (COLACE) capsule 100 mg  100 mg Oral Daily Laveda Abbe, NP   100 mg at 02/09/21 0758   glipiZIDE (GLUCOTROL XL) 24 hr tablet 10 mg  10 mg Oral Q breakfast Novella Olive, NP   10 mg at 02/09/21 6063   haloperidol (HALDOL) tablet 15 mg  15 mg Oral BID Massengill, Harrold Donath, MD       insulin aspart (novoLOG) injection 0-15 Units  0-15 Units Subcutaneous TID WC Hill, Shelbie Hutching, MD   3 Units at 02/09/21 0606   insulin aspart (novoLOG) injection 0-5 Units  0-5 Units Subcutaneous QHS Novella Olive, NP   4 Units at 02/08/21 2123   insulin aspart (novoLOG) injection 15 Units  15 Units Subcutaneous TID WC Laveda Abbe, NP   15 Units at 02/09/21 0609   insulin glargine-yfgn (SEMGLEE) injection 50 Units  50 Units Subcutaneous Daily Laveda Abbe, NP   50 Units at 02/09/21 0800   lisinopril (ZESTRIL) tablet 40 mg  40 mg Oral Daily Novella Olive, NP   40 mg at 02/09/21 0757   lithium carbonate (ESKALITH) CR tablet 450 mg   450 mg Oral Q12H Novella Olive, NP   450 mg at 02/09/21 0756   loxapine (LOXITANE) capsule 20 mg  20 mg Oral BID Dahlia Byes C, NP   20 mg at 02/09/21 0757   magnesium hydroxide (MILK OF MAGNESIA) suspension 30 mL  30 mL Oral Daily PRN Novella Olive, NP       OLANZapine zydis (ZYPREXA) disintegrating tablet 10 mg  10 mg Oral Q8H PRN Novella Olive, NP   10 mg at 02/03/21 2342   And   ziprasidone (GEODON) injection 20 mg  20 mg Intramuscular PRN Novella Olive, NP       polyethylene glycol (MIRALAX / GLYCOLAX) packet 17 g  17 g Oral Daily PRN Laveda Abbe, NP       traZODone (DESYREL) tablet 50 mg  50 mg Oral QHS PRN Novella Olive, NP   50 mg at 02/08/21 2126   PTA Medications: Medications Prior to Admission  Medication Sig Dispense Refill Last Dose   amLODipine (NORVASC) 10 MG tablet Take 1 tablet (10 mg total) by mouth daily. (Patient not taking: Reported on 01/26/2021) 90 tablet 1    aspirin 81 MG EC tablet Take 1 tablet (  81 mg total) by mouth daily. Swallow whole. 30 tablet 12    benztropine (COGENTIN) 1 MG tablet Take 1 tablet (1 mg total) by mouth 2 (two) times daily. (Patient not taking: Reported on 01/26/2021) 60 tablet 2    cloNIDine (CATAPRES) 0.2 MG tablet Take 0.2 mg by mouth 2 (two) times daily. (Patient not taking: Reported on 01/26/2021)      fluticasone (FLONASE) 50 MCG/ACT nasal spray Place 1 spray into both nostrils daily as needed for allergies.  (Patient not taking: Reported on 01/26/2021)      glipiZIDE (GLUCOTROL XL) 10 MG 24 hr tablet Take 1 tablet (10 mg total) by mouth daily with breakfast. 90 tablet 1    haloperidol (HALDOL) 20 MG tablet Take 1 tablet (20 mg total) by mouth at bedtime. (Patient not taking: Reported on 01/26/2021) 90 tablet 1    insulin glargine (LANTUS) 100 UNIT/ML injection Inject 10 Units into the skin daily.      insulin lispro (HUMALOG) 100 UNIT/ML KwikPen Inject 2-10 Units into the skin 3 (three) times daily. Per sliding scale  (Patient not taking: Reported on 01/26/2021)      lactulose (CHRONULAC) 10 GM/15ML solution Take 30 mLs by mouth daily as needed for mild constipation.  (Patient not taking: Reported on 01/26/2021)      LEVEMIR FLEXTOUCH 100 UNIT/ML FlexPen Inject 20 Units into the skin at bedtime. (Patient not taking: Reported on 01/26/2021)      liraglutide (VICTOZA) 18 MG/3ML SOPN Inject 1.8 mg into the skin daily. (Patient not taking: Reported on 01/26/2021)      lisinopril (ZESTRIL) 40 MG tablet Take 1 tablet (40 mg total) by mouth daily. (Patient not taking: No sig reported) 90 tablet 1    lithium carbonate (ESKALITH) 450 MG CR tablet Take 1 tablet (450 mg total) by mouth every 12 (twelve) hours. (Patient not taking: Reported on 01/26/2021) 60 tablet 2    metFORMIN (GLUCOPHAGE) 1000 MG tablet Take 1 tablet (1,000 mg total) by mouth 2 (two) times daily with a meal. (Patient not taking: Reported on 01/26/2021) 60 tablet 2    NOVOLOG FLEXPEN 100 UNIT/ML FlexPen Inject 5 Units into the skin with breakfast, with lunch, and with evening meal. (Patient not taking: Reported on 01/26/2021)      tobramycin-dexamethasone (TOBRADEX) ophthalmic solution Place 2 drops into both eyes every 6 (six) hours as needed. (Patient not taking: Reported on 01/26/2021)       Patient Stressors: Health problems   Medication change or noncompliance    Patient Strengths: Capable of independent living  Supportive family/friends   Treatment Modalities: Medication Management, Group therapy, Case management,  1 to 1 session with clinician, Psychoeducation, Recreational therapy.   Physician Treatment Plan for Primary Diagnosis: Schizophrenia (HCC) Long Term Goal(s): Improvement in symptoms so as ready for discharge   Short Term Goals: Compliance with prescribed medications will improve Ability to maintain clinical measurements within normal limits will improve  Medication Management: Evaluate patient's response, side effects, and  tolerance of medication regimen.  Therapeutic Interventions: 1 to 1 sessions, Unit Group sessions and Medication administration.  Evaluation of Outcomes: Not Progressing  Physician Treatment Plan for Secondary Diagnosis: Principal Problem:   Schizophrenia (HCC)  Long Term Goal(s): Improvement in symptoms so as ready for discharge   Short Term Goals: Compliance with prescribed medications will improve Ability to maintain clinical measurements within normal limits will improve     Medication Management: Evaluate patient's response, side effects, and tolerance of medication regimen.  Therapeutic Interventions: 1 to 1 sessions, Unit Group sessions and Medication administration.  Evaluation of Outcomes: Not Progressing   RN Treatment Plan for Primary Diagnosis: Schizophrenia (HCC) Long Term Goal(s): Knowledge of disease and therapeutic regimen to maintain health will improve  Short Term Goals: Ability to remain free from injury will improve, Ability to verbalize frustration and anger appropriately will improve, Ability to participate in decision making will improve, Ability to verbalize feelings will improve, Ability to identify and develop effective coping behaviors will improve, and Compliance with prescribed medications will improve  Medication Management: RN will administer medications as ordered by provider, will assess and evaluate patient's response and provide education to patient for prescribed medication. RN will report any adverse and/or side effects to prescribing provider.  Therapeutic Interventions: 1 on 1 counseling sessions, Psychoeducation, Medication administration, Evaluate responses to treatment, Monitor vital signs and CBGs as ordered, Perform/monitor CIWA, COWS, AIMS and Fall Risk screenings as ordered, Perform wound care treatments as ordered.  Evaluation of Outcomes: Not Progressing   LCSW Treatment Plan for Primary Diagnosis: Schizophrenia (HCC) Long Term  Goal(s): Safe transition to appropriate next level of care at discharge, Engage patient in therapeutic group addressing interpersonal concerns.  Short Term Goals: Engage patient in aftercare planning with referrals and resources, Increase social support, Increase ability to appropriately verbalize feelings, Increase emotional regulation, Facilitate acceptance of mental health diagnosis and concerns, Identify triggers associated with mental health/substance abuse issues, and Increase skills for wellness and recovery  Therapeutic Interventions: Assess for all discharge needs, 1 to 1 time with Social worker, Explore available resources and support systems, Assess for adequacy in community support network, Educate family and significant other(s) on suicide prevention, Complete Psychosocial Assessment, Interpersonal group therapy.  Evaluation of Outcomes: Not Progressing   Progress in Treatment: Attending groups: Yes. and No. Participating in groups: Yes. and No. Taking medication as prescribed: Yes. Toleration medication: Yes. Family/Significant other contact made: No, will contact:  declined consents Patient understands diagnosis: No. Discussing patient identified problems/goals with staff: No. Medical problems stabilized or resolved: Yes. Denies suicidal/homicidal ideation: Yes. Issues/concerns per patient self-inventory: No. Other: None   New problem(s) identified: No, Describe:  None   New Short Term/Long Term Goal(s):medication stabilization, elimination of SI thoughts, development of comprehensive mental wellness plan.    Patient Goals:  no goal   Discharge Plan or Barriers: Patient is established with PSR and CST services and is to return home t discharge.    Reason for Continuation of Hospitalization: Delusions  Hallucinations Medication stabilization   Estimated Length of Stay: 3-5 days   Scribe for Treatment Team: Otelia Santee, LCSW 02/09/2021 12:03 PM

## 2021-02-09 NOTE — Progress Notes (Signed)
   02/09/21 2005  Psych Admission Type (Psych Patients Only)  Admission Status Involuntary  Psychosocial Assessment  Patient Complaints None  Eye Contact Avertive  Facial Expression Flat  Affect Preoccupied  Speech Logical/coherent  Interaction Minimal  Motor Activity Slow  Appearance/Hygiene Improved  Behavior Characteristics Cooperative  Mood Preoccupied  Thought Process  Coherency WDL  Content Preoccupation  Delusions None reported or observed  Perception Derealization;Hallucinations  Hallucination None reported or observed  Judgment Impaired  Confusion UTA  Danger to Self  Current suicidal ideation? Denies  Danger to Others  Danger to Others None reported or observed   Pt seen in dayroom. Pt denies SI, HI, AVH and pain. Pt denies anxiety and depression. Discussed ultrasound of abdomen tomorrow and need to abstain from food and drink after midnight. Pt verbalized understanding. Pt says he is feeling blessed today. Blood sugars still quite variable. Pt being given insulin in arm since abdomen is so distended and tight.

## 2021-02-09 NOTE — Group Note (Signed)
Recreation Therapy Group Note   Group Topic:Health and Wellness  Group Date: 02/09/2021 Start Time: 0955 End Time: 1035 Facilitators: Caroll Rancher, LRT,CTRS Location: [address] Dayroom   Goal Area(s) Addresses:  Patient will actively participate in selected exercises.  Patient will verbalize benefit of exercise during group session. Patient will identify an exercise that can be completed post d/c. Patient will acknowledge benefits of exercise when used as a coping mechanism.   Group Description:  LRT and patients focused the wellness component of exercise.  LRT led patients through a series of stretches to loosen the body and get prepared for the exercises to come.  Each person led group in exercises of their choosing.  Patients were informed to do the best they could and to not strain or do anything that would cause harm.  Patients were encouraged to get water and take breaks as needed.     Affect/Mood: N/A   Participation Level: Did not attend    Clinical Observations/Individualized Feedback: Pt did not attend group.    Plan: Continue to engage patient in RT group sessions 2-3x/week.   Caroll Rancher, LRT,CTRS 02/09/2021 1:00 PM

## 2021-02-09 NOTE — Progress Notes (Signed)
Adult Psychoeducational Group Note  Date:  02/09/2021 Time:  9:17 PM  Group Topic/Focus:  Wrap-Up Group:   The focus of this group is to help patients review their daily goal of treatment and discuss progress on daily workbooks.  Participation Level:  Active  Participation Quality:  Appropriate  Affect:  Appropriate  Cognitive:  Appropriate  Insight: Appropriate  Engagement in Group:  Improving  Modes of Intervention:  Discussion  Additional Comments:  Pt stated his goal for today was to focus on his treatment plan. Pt stated he accomplished his goal today. Pt stated he talked with his doctor and social worker about his care today. Pt rated his overall day a 10. Pt stated he was able to contact his daughter today which improved his overall day. Pt stated he felt better about himself today. Pt stated he was able to attend all meals. Pt stated he took all medications provided today. Pt stated his appetite was pretty good today. Pt rated sleep last night was pretty good. Pt stated the goal tonight was to get some rest. Pt stated he had no physical pain tonight. Pt deny visual hallucinations and auditory issues tonight. Pt denies thoughts of harming himself or others. Pt stated he would alert staff if anything changed.  Mitchell [name] 02/09/2021, 9:17 PM

## 2021-02-09 NOTE — Group Note (Signed)
LCSW Group Therapy Note   Group Date: 02/09/2021 Start Time: 1300 End Time: 1400   Type of Therapy and Topic:  Group Therapy: Attachment Styles   Participation Level:  None    Summary of Patient Progress:  Did not attend    Otelia Santee, LCSW 02/09/2021  2:01 PM

## 2021-02-09 NOTE — Progress Notes (Signed)
   02/09/21 0758  Psych Admission Type (Psych Patients Only)  Admission Status Involuntary  Psychosocial Assessment  Patient Complaints None  Eye Contact Fair  Facial Expression Flat  Affect Preoccupied;Appropriate to circumstance  Speech Tangential;Logical/coherent  Interaction Minimal  Motor Activity Slow  Appearance/Hygiene Improved  Behavior Characteristics Anxious;Agitated  Mood Anxious  Aggressive Behavior  Effect No apparent injury  Thought Process  Coherency Disorganized;Tangential  Content Preoccupation  Delusions Paranoid  Perception Derealization;Hallucinations  Hallucination Auditory (Pt is actively responding to internal stimuli. Talking loudly to self at medication window.)  Judgment Impaired  Confusion Moderate  Danger to Self  Current suicidal ideation? Denies  Danger to Others  Danger to Others None reported or observed

## 2021-02-09 NOTE — [facility] Group Notes (Signed)
Adult Psychoeducational Group Note  Date:  02/09/2021 Time:  4:16 PM  Group Topic/Focus:  Making Healthy Choices:   The focus of this group is to help patients identify negative/unhealthy choices they were using prior to admission and identify positive/healthier coping strategies to replace them upon discharge.  Participation Level:  Active  Participation Quality:  Appropriate  Affect:  Appropriate  Cognitive:  Appropriate  Insight: Appropriate  Engagement in Group:  Engaged  Modes of Intervention:  Education  Additional Comments:  Patient states his sugar has been good (around 250), which is a lot lower for him. He states he exercises and was walking to work when he was admitted to the hospital. Deno's goal is to keep his sugar under control.   Thomas Hoff 02/09/2021, 4:16 PM

## 2021-02-10 ENCOUNTER — Inpatient Hospital Stay (HOSPITAL_COMMUNITY): Payer: Medicare Other

## 2021-02-10 LAB — COMPREHENSIVE METABOLIC PANEL
ALT: 62 U/L — ABNORMAL HIGH (ref 0–44)
AST: 39 U/L (ref 15–41)
Albumin: 4.1 g/dL (ref 3.5–5.0)
Alkaline Phosphatase: 136 U/L — ABNORMAL HIGH (ref 38–126)
Anion gap: 10 (ref 5–15)
BUN: 18 mg/dL (ref 6–20)
CO2: 26 mmol/L (ref 22–32)
Calcium: 9.5 mg/dL (ref 8.9–10.3)
Chloride: 100 mmol/L (ref 98–111)
Creatinine, Ser: 1.08 mg/dL (ref 0.61–1.24)
GFR, Estimated: >60 mL/min (ref >60)
Glucose, Bld: 269 mg/dL — ABNORMAL HIGH (ref 70–99)
Potassium: 4 mmol/L (ref 3.5–5.1)
Sodium: 136 mmol/L (ref 135–145)
Total Bilirubin: 0.4 mg/dL (ref 0.3–1.2)
Total Protein: 8.2 g/dL — ABNORMAL HIGH (ref 6.5–8.1)

## 2021-02-10 LAB — GLUCOSE, CAPILLARY
Glucose-Capillary: 241 mg/dL — ABNORMAL HIGH (ref 70–99)
Glucose-Capillary: 295 mg/dL — ABNORMAL HIGH (ref 70–99)
Glucose-Capillary: 363 mg/dL — ABNORMAL HIGH (ref 70–99)
Glucose-Capillary: 511 mg/dL (ref 70–99)
Glucose-Capillary: 370 mg/dL — ABNORMAL HIGH (ref 70–99)

## 2021-02-10 LAB — CBC WITH DIFFERENTIAL/PLATELET
Abs Immature Granulocytes: 0.03 10*3/uL (ref 0.00–0.07)
Basophils Absolute: 0.1 10*3/uL (ref 0.0–0.1)
Basophils Relative: 1 %
Eosinophils Absolute: 0.5 10*3/uL (ref 0.0–0.5)
Eosinophils Relative: 7 %
HCT: 45.3 % (ref 39.0–52.0)
Hemoglobin: 14.8 g/dL (ref 13.0–17.0)
Immature Granulocytes: 0 %
Lymphocytes Relative: 40 %
Lymphs Abs: 2.8 10*3/uL (ref 0.7–4.0)
MCH: 31.1 pg (ref 26.0–34.0)
MCHC: 32.7 g/dL (ref 30.0–36.0)
MCV: 95.2 fL (ref 80.0–100.0)
Monocytes Absolute: 0.5 10*3/uL (ref 0.1–1.0)
Monocytes Relative: 8 %
Neutro Abs: 3.2 10*3/uL (ref 1.7–7.7)
Neutrophils Relative %: 44 %
Platelets: 238 10*3/uL (ref 150–400)
RBC: 4.76 MIL/uL (ref 4.22–5.81)
RDW: 12.8 % (ref 11.5–15.5)
WBC: 7.1 10*3/uL (ref 4.0–10.5)
nRBC: 0 % (ref 0.0–0.2)

## 2021-02-10 LAB — LIPASE, BLOOD: Lipase: 64 U/L — ABNORMAL HIGH (ref 11–51)

## 2021-02-10 LAB — BRAIN NATRIURETIC PEPTIDE: B Natriuretic Peptide: 19.6 pg/mL (ref 0.0–100.0)

## 2021-02-10 LAB — C-REACTIVE PROTEIN: CRP: 1.4 mg/dL — ABNORMAL HIGH (ref <1.0)

## 2021-02-10 LAB — TROPONIN I (HIGH SENSITIVITY): Troponin I (High Sensitivity): 4 ng/L (ref <18)

## 2021-02-10 LAB — SEDIMENTATION RATE: Sed Rate: 7 mm/hr (ref 0–16)

## 2021-02-10 LAB — AMMONIA: Ammonia: 28 umol/L (ref 9–35)

## 2021-02-10 MED ORDER — SENNOSIDES-DOCUSATE SODIUM 8.6-50 MG PO TABS: 1.0000 | ORAL_TABLET | Freq: Every day | ORAL | Status: DC

## 2021-02-10 MED ORDER — SENNOSIDES-DOCUSATE SODIUM 8.6-50 MG PO TABS
1.0000 | ORAL_TABLET | Freq: Every day | ORAL | Status: DC
  Administered 2021-02-11 – 2021-03-02 (×19): 1 via ORAL
  Filled 2021-02-10 (×24): qty 1

## 2021-02-10 MED ORDER — INSULIN GLARGINE-YFGN 100 UNIT/ML ~~LOC~~ SOLN
50.0000 [IU] | Freq: Every day | SUBCUTANEOUS | Status: DC
  Administered 2021-02-11 – 2021-02-17 (×7): 50 [IU] via SUBCUTANEOUS

## 2021-02-10 MED ORDER — INSULIN ASPART 100 UNIT/ML IJ SOLN
8.0000 [IU] | Freq: Once | INTRAMUSCULAR | Status: AC
  Administered 2021-02-10: 8 [IU] via SUBCUTANEOUS

## 2021-02-10 MED ORDER — LOXAPINE SUCCINATE 10 MG PO CAPS
10.0000 mg | ORAL_CAPSULE | Freq: Two times a day (BID) | ORAL | Status: AC
  Administered 2021-02-10 – 2021-02-11 (×2): 10 mg via ORAL
  Filled 2021-02-10 (×2): qty 1

## 2021-02-10 MED ORDER — CLOZAPINE 25 MG PO TABS
12.5000 mg | ORAL_TABLET | Freq: Two times a day (BID) | ORAL | Status: AC
  Administered 2021-02-10 – 2021-02-12 (×4): 12.5 mg via ORAL
  Filled 2021-02-10 (×6): qty 1

## 2021-02-10 NOTE — Plan of Care (Signed)
  Problem: Activity: Goal: Interest or engagement in activities will improve Outcome: Progressing Goal: Sleeping patterns will improve Outcome: Progressing   

## 2021-02-10 NOTE — Progress Notes (Signed)
Pharmacy:  Clozapine  Pt registered by MD in clozapine REMS  Pt REMS ID [id number]  Will require weekly anc   Peggye Fothergill, Pharm D

## 2021-02-10 NOTE — Progress Notes (Signed)
Progress note    02/10/21 0730  Psych Admission Type (Psych Patients Only)  Admission Status Involuntary  Psychosocial Assessment  Patient Complaints Anxiety  Eye Contact Fair  Facial Expression Anxious;Pensive  Affect Anxious;Preoccupied  Child psychotherapist Assertive  Motor Activity Slow  Appearance/Hygiene Unremarkable  Behavior Characteristics Cooperative;Appropriate to situation;Anxious  Mood Anxious;Preoccupied;Pleasant  Thought Process  Coherency Blocking  Content Preoccupation  Delusions None reported or observed  Perception Derealization;Hallucinations  Hallucination None reported or observed  Judgment Limited  Confusion None  Danger to Self  Current suicidal ideation? Denies  Danger to Others  Danger to Others None reported or observed

## 2021-02-10 NOTE — Progress Notes (Signed)
Pt didn't attend orientation/goals group due to having to get tests done at [facility].

## 2021-02-10 NOTE — Group Note (Signed)
Recreation Therapy Group Note   Group Topic:Other  Group Date: 02/10/2021 Start Time: 1000 End Time: 1050 Facilitators: Caroll Rancher, LRT,CTRS Location: [address] Dayroom   Goal Area(s) Addresses: Patient will successfully practice self-awareness and reflect on current values, lifestyle, and habits.   Patient will identify how skills learned during activity can be used to reach post d/c goals and make healthy changes.     Group Description: My [facility]BT House. LRT and patients held a group discussion on behavioral expectations and group topic promoting self-awareness and reflection. Writer drew a diagram of a house and used interactive methods to incorporate patients in the labelling process, allowing for open response and teach back to ensure understanding. Patients were given their own sheet to label as the group shared ideas.  Sections and labels included:        Foundation- Values that govern their life       Walls- People and things that support them through the day to day       Door- Things they hide from others        Basement- Behaviors they are trying to gain control of or areas of their life they want to change       1st Floor- Emotions they want to experience more often, more fully, or in a healthier way       2nd Floor- List of all the things they are happy about or want to feel happy about       3rd Floor/Attic- List of what a "life worth living" would look like for them       Roof- People or factors that protect them       [place]- Challenging emotions and triggers they experience       Smoke- Ways they "blow off steam"      Yard Sign- Things they are proud of and want others to see       Sunshine- What brings them joy  Patients were instructed to complete this with realistic answers, not filtering responses. Patients were offered debriefing on the activity and encouraged to speak on areas they like about what they listed and what they want to see change within their  diagram post discharge.    Affect/Mood: N/A   Participation Level: Did not attend    Clinical Observations/Individualized Feedback: Pt was off unit during group time.    Plan: Continue to engage patient in RT group sessions 2-3x/week.   Caroll Rancher, LRT,CTRS 02/10/2021 12:50 PM

## 2021-02-10 NOTE — Progress Notes (Addendum)
[facility] MD Progress Note  02/10/2021 2:21 PM Mitchell [name]  MRN:  [id number] Subjective:   Mitchell [name] is a 60 year old male with a past history of schizophrenia presenting under IVC for acute psychosis. He was exhibiting bizarre behavior (e.g. running through the streets naked).   Yesterday the following changes were made to the patient's regimen: -No changes made  The patient's chart was reviewed and nursing notes were reviewed. Over the past 24 hrs, the patient received no PRN medications for agitation. Per staff the patient was talking with unseen others. The patient's case was discussed in multidisciplinary team meeting.   On interview this morning, the patient appears similar to previous days.  He is able to engage well on interview initially. However, as the interview progresses he reveals delusions and is unable to perform basic cognitive tasks.  When asked to the president is, he reports that it is him.  When challenged to state who the vice president is, he is unsure but states that he thinks it is a male.  He is unable to perform serial sevens and gases 900 for his second answer.  He reports that the year is 202023 and spells WORLD backwards as DLNAW.   Principal Problem: Schizophrenia ([place]) Diagnosis: Principal Problem:   Schizophrenia ([place])  Total Time Spent in Direct Patient Care: I personally spent 30 minutes on the unit in direct patient care. The direct patient care time included face-to-face time with the patient, reviewing the patient's chart, communicating with other professionals, and coordinating care. Greater than 50% of this time was spent in counseling or coordinating care with the patient regarding goals of hospitalization, psycho-education, and discharge planning needs.   Past Psychiatric History: schizophrenia/schizoaffective disorder  Past Medical History:  Past Medical History:  Diagnosis Date   Bipolar affective disorder ([place])    takes Zyprexa daily    Diabetes mellitus    takes Victoza,Metformin,and Glipizide daily   Hypertension    takes Amlodipine,Lisinopril and Clonidine daily   Hyponatremia    history of   Mental disorder    takes Lithium daily   Schizoaffective disorder    takes Trazodone nightly   Seasonal allergies    takes Claritin daily   Sleep apnea    sleep study >25yr ago   Stroke ([facility]    left arm weakness   Past Surgical History:  Procedure Laterality Date   CATARACT EXTRACTION W/PHACO Right 02/14/2013   Procedure: CATARACT EXTRACTION PHACO AND INTRAOCULAR LENS PLACEMENT ([place];  Surgeon: GAdonis Brook MD;  Location: [facility]  Service: Ophthalmology;  Laterality: Right;   CATARACT EXTRACTION W/PHACO Left 06/13/2013   Procedure: CATARACT EXTRACTION PHACO AND INTRAOCULAR LENS PLACEMENT ([facility]);  Surgeon: GAdonis Brook MD;  [facility]  Service: Ophthalmology;  Laterality: Left;   CIRCUMCISION  20 yrs. ago   EYE SURGERY     Family History: History reviewed. No pertinent family history. Family Psychiatric  History: reports an Hx of BPAD Social History:  Social History   Substance and Sexual Activity  Alcohol Use Not Currently     Social History   Substance and Sexual Activity  Drug Use No    Social History   Socioeconomic History   Marital status: Divorced    Spouse name: Not on file   Number of children: Not on file   Years of education: Not on file   Highest education level: Not on file  Occupational History   Not on file  Tobacco Use   Smoking status: Never  Smokeless tobacco: Never  Vaping Use   Vaping Use: Never used  Substance and Sexual Activity   Alcohol use: Not Currently   Drug use: No   Sexual activity: Yes    Birth control/protection: None  Other Topics Concern   Not on file  Social History Narrative   Not on file   Social Determinants of Health   Financial Resource Strain: Not on file  Food Insecurity: Not on file  Transportation Needs: Not on file  Physical Activity: Not  on file  Stress: Not on file  Social Connections: Not on file   Additional Social History:     Sleep: Fair  Appetite:  Fair  Current Medications: Current Facility-Administered Medications  Medication Dose Route Frequency Provider Last Rate Last Admin   acetaminophen (TYLENOL) tablet 650 mg  650 mg Oral Q6H PRN Chalmers Guest, NP       alum & mag hydroxide-simeth (MAALOX/MYLANTA) 200-200-20 MG/5ML suspension 30 mL  30 mL Oral Q4H PRN Chalmers Guest, NP       amLODipine (NORVASC) tablet 10 mg  10 mg Oral Daily Chalmers Guest, NP   10 mg at 02/10/21 1115   aspirin EC tablet 81 mg  81 mg Oral Daily Chalmers Guest, NP   81 mg at 02/10/21 1115   benztropine (COGENTIN) tablet 1 mg  1 mg Oral BID Chalmers Guest, NP   1 mg at 02/10/21 1116   cloNIDine (CATAPRES) tablet 0.2 mg  0.2 mg Oral BID Chalmers Guest, NP   0.2 mg at 02/10/21 1115   docusate sodium (COLACE) capsule 100 mg  100 mg Oral Daily Ethelene Hal, NP   100 mg at 02/10/21 1115   glipiZIDE (GLUCOTROL XL) 24 hr tablet 10 mg  10 mg Oral Q breakfast Chalmers Guest, NP   10 mg at 02/10/21 1116   haloperidol (HALDOL) tablet 15 mg  15 mg Oral BID Kynsie Falkner, Ovid Curd, MD   15 mg at 02/10/21 1114   insulin aspart (novoLOG) injection 0-15 Units  0-15 Units Subcutaneous TID WC Hill, Jackie Plum, MD   8 Units at 02/10/21 1200   insulin aspart (novoLOG) injection 0-5 Units  0-5 Units Subcutaneous QHS Chalmers Guest, NP   3 Units at 02/09/21 2050   insulin aspart (novoLOG) injection 15 Units  15 Units Subcutaneous TID WC Somya Jauregui, Ovid Curd, MD   15 Units at 02/10/21 1200   [START ON 02/11/2021] insulin glargine-yfgn (SEMGLEE) injection 50 Units  50 Units Subcutaneous Daily Terrin Imparato, MD       lisinopril (ZESTRIL) tablet 40 mg  40 mg Oral Daily Chalmers Guest, NP   40 mg at 02/10/21 1114   lithium carbonate (ESKALITH) CR tablet 450 mg  450 mg Oral Q12H Chalmers Guest, NP   450 mg at 02/10/21 1115   loxapine (LOXITANE) capsule 20  mg  20 mg Oral BID Charmaine Downs C, NP   20 mg at 02/10/21 1115   magnesium hydroxide (MILK OF MAGNESIA) suspension 30 mL  30 mL Oral Daily PRN Chalmers Guest, NP       OLANZapine zydis (ZYPREXA) disintegrating tablet 10 mg  10 mg Oral Q8H PRN Chalmers Guest, NP   10 mg at 02/03/21 2342   And   ziprasidone (GEODON) injection 20 mg  20 mg Intramuscular PRN Chalmers Guest, NP       polyethylene glycol (MIRALAX / GLYCOLAX) packet 17 g  17 g Oral Daily  PRN Ethelene Hal, NP       traZODone (DESYREL) tablet 50 mg  50 mg Oral QHS PRN Chalmers Guest, NP   50 mg at 02/09/21 2049    Lab Results:  Results for orders placed or performed during the hospital encounter of 01/28/21 (from the past 48 hour(s))  Glucose, capillary     Status: Abnormal   Collection Time: 02/08/21  5:13 PM  Result Value Ref Range   Glucose-Capillary 454 (H) 70 - 99 mg/dL    Comment: Glucose reference range applies only to samples taken after fasting for at least 8 hours.  CBC with Differential/Platelet     Status: Abnormal   Collection Time: 02/08/21  6:50 PM  Result Value Ref Range   WBC 6.4 4.0 - 10.5 K/uL   RBC 4.07 (L) 4.22 - 5.81 MIL/uL   Hemoglobin 12.6 (L) 13.0 - 17.0 g/dL   HCT 38.5 (L) 39.0 - 52.0 %   MCV 94.6 80.0 - 100.0 fL   MCH 31.0 26.0 - 34.0 pg   MCHC 32.7 30.0 - 36.0 g/dL   RDW 12.7 11.5 - 15.5 %   Platelets 226 150 - 400 K/uL   nRBC 0.0 0.0 - 0.2 %   Neutrophils Relative % 49 %   Neutro Abs 3.2 1.7 - 7.7 K/uL   Lymphocytes Relative 35 %   Lymphs Abs 2.2 0.7 - 4.0 K/uL   Monocytes Relative 8 %   Monocytes Absolute 0.5 0.1 - 1.0 K/uL   Eosinophils Relative 6 %   Eosinophils Absolute 0.4 0.0 - 0.5 K/uL   Basophils Relative 1 %   Basophils Absolute 0.0 0.0 - 0.1 K/uL   Immature Granulocytes 1 %   Abs Immature Granulocytes 0.04 0.00 - 0.07 K/uL    Comment: Performed at [facility], [place] [address], [place], [place] [address]  Hepatic function panel     Status:  Abnormal   Collection Time: 02/08/21  6:50 PM  Result Value Ref Range   Total Protein 7.6 6.5 - 8.1 g/dL   Albumin 4.0 3.5 - 5.0 g/dL   AST 56 (H) 15 - 41 U/L   ALT 69 (H) 0 - 44 U/L   Alkaline Phosphatase 149 (H) 38 - 126 U/L   Total Bilirubin 0.2 (L) 0.3 - 1.2 mg/dL   Bilirubin, Direct <0.1 0.0 - 0.2 mg/dL   Indirect Bilirubin NOT CALCULATED 0.3 - 0.9 mg/dL    Comment: Performed at [facility], [place] [address] [place], [place] [address]  Glucose, capillary     Status: Abnormal   Collection Time: 02/08/21  7:22 PM  Result Value Ref Range   Glucose-Capillary 387 (H) 70 - 99 mg/dL    Comment: Glucose reference range applies only to samples taken after fasting for at least 8 hours.  Glucose, capillary     Status: Abnormal   Collection Time: 02/08/21  9:20 PM  Result Value Ref Range   Glucose-Capillary 310 (H) 70 - 99 mg/dL    Comment: Glucose reference range applies only to samples taken after fasting for at least 8 hours.  Glucose, capillary     Status: Abnormal   Collection Time: 02/09/21  5:32 AM  Result Value Ref Range   Glucose-Capillary 155 (H) 70 - 99 mg/dL    Comment: Glucose reference range applies only to samples taken after fasting for at least 8 hours.   Comment 1 Notify RN   Glucose, capillary  Status: Abnormal   Collection Time: 02/09/21 12:20 PM  Result Value Ref Range   Glucose-Capillary 275 (H) 70 - 99 mg/dL    Comment: Glucose reference range applies only to samples taken after fasting for at least 8 hours.  Glucose, capillary     Status: Abnormal   Collection Time: 02/09/21  5:22 PM  Result Value Ref Range   Glucose-Capillary 299 (H) 70 - 99 mg/dL    Comment: Glucose reference range applies only to samples taken after fasting for at least 8 hours.  Glucose, capillary     Status: Abnormal   Collection Time: 02/09/21  7:40 PM  Result Value Ref Range   Glucose-Capillary 275 (H) 70 - 99 mg/dL    Comment: Glucose reference range applies  only to samples taken after fasting for at least 8 hours.  Ammonia     Status: None   Collection Time: 02/10/21  6:42 AM  Result Value Ref Range   Ammonia 28 9 - 35 umol/L    Comment: Performed at [facility], [place] [address] [place], [place] [address]  Lipase, blood     Status: Abnormal   Collection Time: 02/10/21  6:42 AM  Result Value Ref Range   Lipase 64 (H) 11 - 51 U/L    Comment: Performed at [facility], [place] [address] [place], [place] [address]  Comprehensive metabolic panel     Status: Abnormal   Collection Time: 02/10/21  6:42 AM  Result Value Ref Range   Sodium 136 135 - 145 mmol/L   Potassium 4.0 3.5 - 5.1 mmol/L   Chloride 100 98 - 111 mmol/L   CO2 26 22 - 32 mmol/L   Glucose, Bld 269 (H) 70 - 99 mg/dL    Comment: Glucose reference range applies only to samples taken after fasting for at least 8 hours.   BUN 18 6 - 20 mg/dL   Creatinine, Ser 1.08 0.61 - 1.24 mg/dL   Calcium 9.5 8.9 - 10.3 mg/dL   Total Protein 8.2 (H) 6.5 - 8.1 g/dL   Albumin 4.1 3.5 - 5.0 g/dL   AST 39 15 - 41 U/L   ALT 62 (H) 0 - 44 U/L   Alkaline Phosphatase 136 (H) 38 - 126 U/L   Total Bilirubin 0.4 0.3 - 1.2 mg/dL   GFR, Estimated >60 >60 mL/min    Comment: (NOTE) Calculated using the CKD-EPI Creatinine Equation (2021)    Anion gap 10 5 - 15    Comment: Performed at [facility], [place] [address] [place], [place] [address]  CBC with Differential/Platelet     Status: None   Collection Time: 02/10/21  6:42 AM  Result Value Ref Range   WBC 7.1 4.0 - 10.5 K/uL   RBC 4.76 4.22 - 5.81 MIL/uL   Hemoglobin 14.8 13.0 - 17.0 g/dL   HCT 45.3 39.0 - 52.0 %   MCV 95.2 80.0 - 100.0 fL   MCH 31.1 26.0 - 34.0 pg   MCHC 32.7 30.0 - 36.0 g/dL   RDW 12.8 11.5 - 15.5 %   Platelets 238 150 - 400 K/uL   nRBC 0.0 0.0 - 0.2 %   Neutrophils Relative % 44 %   Neutro Abs 3.2 1.7 - 7.7 K/uL   Lymphocytes Relative 40 %   Lymphs Abs 2.8 0.7 - 4.0 K/uL    Monocytes Relative 8 %   Monocytes Absolute 0.5 0.1 - 1.0 K/uL   Eosinophils Relative 7 %  Eosinophils Absolute 0.5 0.0 - 0.5 K/uL   Basophils Relative 1 %   Basophils Absolute 0.1 0.0 - 0.1 K/uL   Immature Granulocytes 0 %   Abs Immature Granulocytes 0.03 0.00 - 0.07 K/uL    Comment: Performed at [facility], [place] [address], [place], [place] [address]  Sedimentation rate     Status: None   Collection Time: 02/10/21  6:42 AM  Result Value Ref Range   Sed Rate 7 0 - 16 mm/hr    Comment: Performed at [facility], [place] [address], [place], [place] [address]  Brain natriuretic peptide     Status: None   Collection Time: 02/10/21  6:42 AM  Result Value Ref Range   B Natriuretic Peptide 19.6 0.0 - 100.0 pg/mL    Comment: Performed at [facility], [place] [address], [place], [place] [address]  C-reactive protein     Status: Abnormal   Collection Time: 02/10/21  6:42 AM  Result Value Ref Range   CRP 1.4 (H) <1.0 mg/dL    Comment: Performed at [facility], [place] [address] [place], [place] [address]  Troponin I (High Sensitivity)     Status: None   Collection Time: 02/10/21  6:42 AM  Result Value Ref Range   Troponin I (High Sensitivity) 4 <18 ng/L    Comment: (NOTE) Elevated high sensitivity troponin I (hsTnI) values and significant  changes across serial measurements may suggest ACS but many other  chronic and acute conditions are known to elevate hsTnI results.  Refer to the "Links" section for chest pain algorithms and additional  guidance. Performed at [facility], [place] [address] [place], [place] [address]   Glucose, capillary     Status: Abnormal   Collection Time: 02/10/21  6:54 AM  Result Value Ref Range   Glucose-Capillary 241 (H) 70 - 99 mg/dL    Comment: Glucose reference range applies only to samples taken after fasting for at least 8 hours.   Comment 1 Notify RN    Comment 2 Document  in Chart   Glucose, capillary     Status: Abnormal   Collection Time: 02/10/21 11:50 AM  Result Value Ref Range   Glucose-Capillary 295 (H) 70 - 99 mg/dL    Comment: Glucose reference range applies only to samples taken after fasting for at least 8 hours.    Blood Alcohol level:  Lab Results  Component Value Date   ETH <10 01/26/2021   ETH <10 57/84/6962    Metabolic Disorder Labs: Lab Results  Component Value Date   HGBA1C 11.1 (H) 01/26/2021   MPG 271.87 01/26/2021   MPG 202.99 01/07/2019   No results found for: PROLACTIN Lab Results  Component Value Date   CHOL 140 01/30/2021   TRIG 95 01/30/2021   HDL 49 01/30/2021   CHOLHDL 2.9 01/30/2021   VLDL 19 01/30/2021   LDLCALC 72 01/30/2021   LDLCALC 67 05/20/2020    Physical Findings: AIMS: not yet assessed  Musculoskeletal: Strength & Muscle Tone: within normal limits Gait & Station: normal Patient leans: N/A  Psychiatric Specialty Exam:  Presentation  General Appearance: appropriate for environment Eye Contact:Fair; Fleeting  Speech: mumbling and rambling quality  Speech Volume:Normal  Handedness:Right   Mood and Affect  Mood: "good"  Affect: constricted, irritable  Thought Process  Thought Processes: Disorganized; Irrevelant  Descriptions of Associations:Tangential  Orientation: disoriented to month and year  Thought Content: frank delusions  History of  Schizophrenia/Schizoaffective disorder:Yes  Duration of Psychotic Symptoms:Greater than six months  Hallucinations: denies  Ideas of Reference: none reported  Suicidal Thoughts: none reported Homicidal Thoughts: none reported  Sensorium  Memory:Immediate Poor; Recent Poor; Remote Poor  Judgment:Poor  Insight:Poor   Executive Functions  Concentration:Poor  Attention Span:Poor  [place] of Knowledge:Poor  Language:Good   Psychomotor Activity  Psychomotor Activity: normal   Assets  Assets:Communication  Skills; Social Support; Resilience   Sleep  Sleep: fair   Physical Exam Vitals reviewed.  Constitutional:      Appearance: He is not ill-appearing or toxic-appearing.  Pulmonary:     Effort: Pulmonary effort is normal.  Neurological:     General: No focal deficit present.     Mental Status: He is alert.   ROS not assessed on this encounter Blood pressure (!) 145/83, pulse 100, temperature 97.7 F (36.5 C), temperature source Oral, resp. rate 16, height 5' 3.5" (1.613 m), weight 91.6 kg, SpO2 98 %. Body mass index is 35.22 kg/m.  Assessment: Schizophrenia HTN T2DM, with insulin dependence   Schizophrenia by hx (r/o schizoaffective d/o bipolar type) -Continue Haldol 10 mg BID for psychosis (home med) -Decrease Loxapine to 10 mg QHS tonight, 10 mg AM tomorrow, then STOP.  -Start Clozapine 12.5 mg once nightly, for tonight 02/10/2021.  Tomorrow we will increase the dose to 12.5 mg twice daily.  We will titrate further thereafter, as tolerated and indicated.   -Senokot-S daily -See below for clozapine labs and monitoring  -Continue Cogentin 1 mg BID for EPS prevention for now, will discontinue with increase in clozapine -Continue Lithium 450 mg BID for mood stability  Clozapine work up (labs next due 11/22) Monitoring for myocarditis, neutropenia, and paralytic ileus.  -ANC of 3.2  -ESR of 7 CRP of 1.4  -Echo WNL  -EKG: NSR Qtc 446  -Troponin of 4  -Inquire about bowel movements   Agitation med protocol ordered: -Zydis 10 mg q8hr prn for agitation  -Ativan 1 mg once for anxiety and severe agitation -Geodon 20 mg IM once for agitation Will consider changing to haldol PRN   Continue PRN's: Tylenol, Maalox, Atarax, Milk of Magnesia, Trazodone   Medical Management Covid negative CMP: Cr of 1.45 CBC: unremarkable EtOH: <10 UDS: negative TSH: not collected A1C: 11.1 Lipids: WNL  ABD distention, transaminitis According staff this has increased since admission. KUB  shows non-obstructive gas pattern with no obvious stool burden. ABD exam benign except for protuberant abdomen. ALT persistently elevated to 62. Concern was for ascites despite negative fluid wave. RUQ [place] negative for hepatic parenchymal disease or biliary disease. For ascites causes, ruled out parenchymal disease of the liver, CHF (BNP WNL), and patient likely does not have cancer. Ammonia WNL. Lipase slightly elevated.  -Hepatitis panel  T2DM with insulin dependence Fasting CBG of 241 this AM, highest CBG of 299 yesterday.  -Increase glargine back to 50U -Continue moderate SSI -Continue QHS correction -Continue 15U pranidal coverage  -Continue home glipizide   HTN Continue home regimen -Lisinopril 40 mg daily -Amlodipine 10 mg daily -Clonidine 0.2 mg BID   Elevated Cr (resolved) Cr of 1.45 on a baseline of  1-1.2 at admission, likely pre-renal in the context of decreased PO. Cr down to 1.29 then normalized. Creatinine 1.16 on 11/12   Corky Sox, MD 02/10/2021, 2:21 PM   Total Time Spent in Direct Patient Care:  I personally spent 30 minutes on the unit in direct patient care. The direct patient care time included  face-to-face time with the patient, reviewing the patient's chart, communicating with other professionals, and coordinating care. Greater than 50% of this time was spent in counseling or coordinating care with the patient regarding goals of hospitalization, psycho-education, and discharge planning needs.  I have independently evaluated the patient during a face-to-face assessment on 02/10/21. I reviewed the patient's chart, and I participated in key portions of the service. I discussed the case with the [employer], and I agree with the assessment and plan of care as documented in the House Officer's note, as addended by me or notated below:  I may edits to the note, as above.  Patient continues to be psychotic, without improvement since starting loxapine in addition  to the increased dose of haloperidol. We discussed with the patient starting clozapine, first dose will be tonight.  Patient did not have any questions about this.  We will taper loxapine off as we start and titrate up Clozapine. Labs and imaging for abdominal distention are negative.  We will monitor this. We will trend LFTs. Labs, EKG, and echo, for starting Clozapine reviewed.  Elevated CRP at 1.4. We will start Clozapine tonight at 12 mg once daily.  Tomorrow's dosing will be 12.5 mg twice daily.  We will continue to titrate on Thursday.    We will monitor for side effects including: Malaise, chest pain, fever, cough, shortness of breath, exertional capacity, orthostatic hypotension, tachycardia, sedation, hypersalivation, constipation, GERD, nausea, nocturnal enuresis.  Janine Limbo, MD Psychiatrist

## 2021-02-10 NOTE — Progress Notes (Signed)
Adult Psychoeducational Group Note  Date:  02/10/2021 Time:  11:29 PM  Group Topic/Focus:  Wrap-Up Group:   The focus of this group is to help patients review their daily goal of treatment and discuss progress on daily workbooks.  Participation Level:  Active  Participation Quality:  Appropriate  Affect:  Appropriate  Cognitive:  Appropriate  Insight: Appropriate  Engagement in Group:  Improving  Modes of Intervention:  Discussion  Additional Comments:  Pt stated his goal for today was to focus on his treatment plan. Pt stated he accomplished his goal today. Pt stated he talked with his doctor and social worker about his care today. Pt rated his overall day a 10. Pt stated he made no calls today. Pt stated he felt better about himself today. Pt stated he was able to attend all meals after getting back on the unit for having test done at [facility]. Pt stated he took all medications provided today. Pt stated his appetite was pretty good today. Pt rated sleep last night was pretty good. Pt stated the goal tonight was to get some rest. Pt stated he had no physical pain tonight. Pt deny visual hallucinations and auditory issues tonight. Pt denies thoughts of harming himself or others. Pt stated he would alert staff if anything changed.  [name] 02/10/2021, 11:29 PM

## 2021-02-10 NOTE — Progress Notes (Signed)
Pt was encouraged but didn't attend psycho-ed group.  

## 2021-02-11 LAB — HEPATITIS PANEL, ACUTE
HCV Ab: NONREACTIVE
Hep A IgM: NONREACTIVE
Hep B C IgM: NONREACTIVE
Hepatitis B Surface Ag: NONREACTIVE

## 2021-02-11 LAB — GLUCOSE, CAPILLARY
Glucose-Capillary: 245 mg/dL — ABNORMAL HIGH (ref 70–99)
Glucose-Capillary: 280 mg/dL — ABNORMAL HIGH (ref 70–99)
Glucose-Capillary: 273 mg/dL — ABNORMAL HIGH (ref 70–99)
Glucose-Capillary: 256 mg/dL — ABNORMAL HIGH (ref 70–99)

## 2021-02-11 MED ORDER — CLOZAPINE 25 MG PO TABS
25.0000 mg | ORAL_TABLET | Freq: Two times a day (BID) | ORAL | Status: AC
  Administered 2021-02-12 – 2021-02-14 (×4): 25 mg via ORAL
  Filled 2021-02-11 (×4): qty 1

## 2021-02-11 MED ORDER — CLONIDINE HCL 0.1 MG PO TABS
0.1000 mg | ORAL_TABLET | Freq: Two times a day (BID) | ORAL | Status: DC
  Administered 2021-02-11 – 2021-02-12 (×2): 0.1 mg via ORAL
  Filled 2021-02-11 (×7): qty 1

## 2021-02-11 NOTE — Plan of Care (Signed)
°  Problem: Education: °Goal: Emotional status will improve °Outcome: Progressing °Goal: Mental status will improve °Outcome: Progressing °Goal: Verbalization of understanding the information provided will improve °Outcome: Progressing °  °

## 2021-02-11 NOTE — Progress Notes (Signed)
Pt CBG=370 at 2306. Provider notified. Will proceed with schedule for insulin as already ordered. Pt allowed to go back to bed.

## 2021-02-11 NOTE — Progress Notes (Signed)
   02/10/21 2035  Psych Admission Type (Psych Patients Only)  Admission Status Involuntary  Psychosocial Assessment  Patient Complaints None  Eye Contact Brief  Facial Expression Anxious;Pensive  Affect Anxious;Preoccupied  Child psychotherapist Assertive  Motor Activity Slow  Appearance/Hygiene Unremarkable  Behavior Characteristics Cooperative;Appropriate to situation  Mood Anxious;Preoccupied;Pleasant  Thought Process  Coherency Blocking  Content Preoccupation  Delusions None reported or observed  Perception Derealization;Hallucinations  Hallucination None reported or observed  Judgment Limited  Confusion None  Danger to Self  Current suicidal ideation? Denies  Danger to Others  Danger to Others None reported or observed   Pt seen in room. Pt denies SI, HI, AVH and pain. Pt denies anxiety and depression. Pt CBG this evening is 511. Provider notified and pt told that his blood sugar was elevated. Pt asymptomatic.

## 2021-02-11 NOTE — [facility] Group Notes (Signed)
Adult Psychoeducational Group Note  Date:  02/11/2021 Time:  4:44 PM  Group Topic/Focus:  Healthy Communication:   The focus of this group is to discuss communication, barriers to communication, as well as healthy ways to communicate with others.  Participation Level:  Did Not Attend   Mitchell [name] 02/11/2021, 4:44 PM

## 2021-02-11 NOTE — [facility] Group Notes (Signed)
Adult Psychoeducational Group Note  Date:  02/11/2021 Time:  8:18 PM  Group Topic/Focus:  Wrap-Up Group:   The focus of this group is to help patients review their daily goal of treatment and discuss progress on daily workbooks.  Participation Level:  Active  Participation Quality:  Appropriate  Affect:  Appropriate  Cognitive:  Alert  Insight: Appropriate  Engagement in Group:  Engaged  Modes of Intervention:  Discussion  Additional Comments:  Patient attended and participated in the Wrap-up group.  Jearl Klinefelter 02/11/2021, 8:18 PM

## 2021-02-11 NOTE — Group Note (Signed)
[facility] LCSW Group Therapy Note   Group Date: 02/11/2021 Start Time: 1300 End Time: 1400   Type of Therapy/Topic:  Group Therapy:  Emotion Regulation  Participation Level:  Active   Mood:  Description of Group:    The purpose of this group is to assist patients in learning to regulate negative emotions and experience positive emotions. Patients will be guided to discuss ways in which they have been vulnerable to their negative emotions. These vulnerabilities will be juxtaposed with experiences of positive emotions or situations, and patients challenged to use positive emotions to combat negative ones. Special emphasis will be placed on coping with negative emotions in conflict situations, and patients will process healthy conflict resolution skills.  Therapeutic Goals: Patient will identify two positive emotions or experiences to reflect on in order to balance out negative emotions:  Patient will label two or more emotions that they find the most difficult to experience:  Patient will be able to demonstrate positive conflict resolution skills through discussion or role plays:   Summary of Patient Progress: Due to the acuity and complex discharge plans, group was not held. Patient was provided therapeutic worksheets and asked to meet with CSW as needed.   Therapeutic Modalities:   Cognitive Behavioral Therapy Feelings Identification Dialectical Behavioral Therapy   Jahsir Rama M Karena Kinker, LCSWA 

## 2021-02-11 NOTE — Progress Notes (Signed)
Provider notified that pt blood sugar is over 500. Will contact EDP for guidance. Pt has sliding scale 0-5 units tonight. This writer advised to give pt 8 units Novolog and recheck CBG in 2 hours. Order placed by provider. Pt given 8 units Novolog and explained that blood sugar will be checked again. Pt verbalized understanding.

## 2021-02-11 NOTE — [facility] Group Notes (Signed)
Pt attended goals group 

## 2021-02-11 NOTE — Progress Notes (Signed)
Progress note    02/11/21 0810  Psych Admission Type (Psych Patients Only)  Admission Status Involuntary  Psychosocial Assessment  Patient Complaints None  Eye Contact Fair  Facial Expression Animated  Affect Appropriate to circumstance  Speech Logical/coherent  Interaction Assertive  Motor Activity Slow  Appearance/Hygiene Unremarkable  Behavior Characteristics Cooperative;Appropriate to situation  Mood Pleasant  Thought Process  Coherency Blocking  Content Confabulation;Delusions  Delusions None reported or observed  Perception Derealization;Hallucinations  Hallucination Auditory  Judgment Limited  Confusion None  Danger to Self  Current suicidal ideation? Denies  Danger to Others  Danger to Others None reported or observed

## 2021-02-11 NOTE — Progress Notes (Signed)
[facility] MD Progress Note  02/11/2021 12:22 PM Mitchell [name]  MRN:  [id number] Subjective:   Mitchell [name] is a 60 year old male with a past history of schizophrenia presenting under IVC for acute psychosis. He was exhibiting bizarre behavior (e.g. running through the streets naked).   Yesterday the following changes were made to the patient's regimen: -Decrease Loxapine to 10 mg qhs and 1 more dose today then stop -Start Clozapine 12.5 qhs and 12.5 bid -Senokot daily   The patient's chart was reviewed and nursing notes were reviewed. Over the past 24 hrs, the patient received no PRN medications for agitation. Per staff the patient was talking with unseen others. The patient's case was discussed in multidisciplinary team meeting.   On interview this morning, the patient appears similar to previous days.  He is able to engage well on interview. Patient was able to compensate for any underlying delusions today. However, patient did continue to struggle with basic cognitive tasks and oriented only to self and month. Patient reports he knows the vice president is male. Patient does not state he is the president but was uncertain who it was just that "I didn't vote for him".  He reports that the year is "2 thousand 2020" and spells WORLD backwards as DLNOW.   ROS for clozapine Malaise: denies Chest pain: denies Shortness of breath: denies Exertional capacity: denies Tachycardia: denies Cough: denies Fever: denies Sedation: denies Orthostatic hypotension (dizziness with standing): denies Hypersalivation: denies Constipation: denies Symptoms of GERD: denies Nausea: denies Nocturnal enuresis: denies  Discussed plan today to continue changing medications including increasing clozapine and weaning off loxapine. Patient verbalized agreement.  Principal Problem: Schizophrenia ([place]) Diagnosis: Principal Problem:   Schizophrenia ([place])  Total Time Spent in Direct Patient Care: I personally spent  30 minutes on the unit in direct patient care. The direct patient care time included face-to-face time with the patient, reviewing the patient's chart, communicating with other professionals, and coordinating care. Greater than 50% of this time was spent in counseling or coordinating care with the patient regarding goals of hospitalization, psycho-education, and discharge planning needs.   Past Psychiatric History: schizophrenia/schizoaffective disorder  Past Medical History:  Past Medical History:  Diagnosis Date   Bipolar affective disorder ([place])    takes Zyprexa daily   Diabetes mellitus    takes Victoza,Metformin,and Glipizide daily   Hypertension    takes Amlodipine,Lisinopril and Clonidine daily   Hyponatremia    history of   Mental disorder    takes Lithium daily   Schizoaffective disorder    takes Trazodone nightly   Seasonal allergies    takes Claritin daily   Sleep apnea    sleep study >65yr ago   Stroke ([name]    left arm weakness   Past Surgical History:  Procedure Laterality Date   CATARACT EXTRACTION W/PHACO Right 02/14/2013   Procedure: CATARACT EXTRACTION PHACO AND INTRAOCULAR LENS PLACEMENT ([place];  Surgeon: GAdonis Brook MD;  [place]  Service: Ophthalmology;  Laterality: Right;   CATARACT EXTRACTION W/PHACO Left 06/13/2013   Procedure: CATARACT EXTRACTION PHACO AND INTRAOCULAR LENS PLACEMENT (I[facility]);  Surgeon: GAdonis Brook MD;  [place]  Service: Ophthalmology;  Laterality: Left;   CIRCUMCISION  20 yrs. ago   EYE SURGERY     Family History: History reviewed. No pertinent family history. Family Psychiatric  History: reports an Hx of BPAD Social History:  Social History   Substance and Sexual Activity  Alcohol Use Not Currently     Social  History   Substance and Sexual Activity  Drug Use No    Social History   Socioeconomic History   Marital status: Divorced    Spouse name: Not on file   Number of children: Not on file   Years of  education: Not on file   Highest education level: Not on file  Occupational History   Not on file  Tobacco Use   Smoking status: Never   Smokeless tobacco: Never  Vaping Use   Vaping Use: Never used  Substance and Sexual Activity   Alcohol use: Not Currently   Drug use: No   Sexual activity: Yes    Birth control/protection: None  Other Topics Concern   Not on file  Social History Narrative   Not on file   Social Determinants of Health   Financial Resource Strain: Not on file  Food Insecurity: Not on file  Transportation Needs: Not on file  Physical Activity: Not on file  Stress: Not on file  Social Connections: Not on file   Additional Social History:     Sleep: Fair  Appetite:  Fair  Current Medications: Current Facility-Administered Medications  Medication Dose Route Frequency Provider Last Rate Last Admin   acetaminophen (TYLENOL) tablet 650 mg  650 mg Oral Q6H PRN Chalmers Guest, NP       alum & mag hydroxide-simeth (MAALOX/MYLANTA) 200-200-20 MG/5ML suspension 30 mL  30 mL Oral Q4H PRN Chalmers Guest, NP       amLODipine (NORVASC) tablet 10 mg  10 mg Oral Daily Chalmers Guest, NP   10 mg at 02/11/21 0810   aspirin EC tablet 81 mg  81 mg Oral Daily Chalmers Guest, NP   81 mg at 02/11/21 0810   benztropine (COGENTIN) tablet 1 mg  1 mg Oral BID Chalmers Guest, NP   1 mg at 02/11/21 8144   cloNIDine (CATAPRES) tablet 0.2 mg  0.2 mg Oral BID Chalmers Guest, NP   0.2 mg at 02/11/21 0810   cloZAPine (CLOZARIL) tablet 12.5 mg  12.5 mg Oral BID Massengill, Ovid Curd, MD   12.5 mg at 02/11/21 0812   [START ON 02/12/2021] cloZAPine (CLOZARIL) tablet 25 mg  25 mg Oral BID Massengill, Ovid Curd, MD       glipiZIDE (GLUCOTROL XL) 24 hr tablet 10 mg  10 mg Oral Q breakfast Chalmers Guest, NP   10 mg at 02/11/21 8185   haloperidol (HALDOL) tablet 15 mg  15 mg Oral BID Massengill, Ovid Curd, MD   15 mg at 02/11/21 0811   insulin aspart (novoLOG) injection 0-15 Units  0-15 Units  Subcutaneous TID WC Hill, Jackie Plum, MD   8 Units at 02/11/21 1206   insulin aspart (novoLOG) injection 0-5 Units  0-5 Units Subcutaneous QHS Chalmers Guest, NP   3 Units at 02/09/21 2050   insulin aspart (novoLOG) injection 15 Units  15 Units Subcutaneous TID WC Janine Limbo, MD   15 Units at 02/11/21 1207   insulin glargine-yfgn (SEMGLEE) injection 50 Units  50 Units Subcutaneous Daily Massengill, Ovid Curd, MD   50 Units at 02/11/21 0810   lisinopril (ZESTRIL) tablet 40 mg  40 mg Oral Daily Chalmers Guest, NP   40 mg at 02/11/21 0811   lithium carbonate (ESKALITH) CR tablet 450 mg  450 mg Oral Q12H Chalmers Guest, NP   450 mg at 02/11/21 0811   magnesium hydroxide (MILK OF MAGNESIA) suspension 30 mL  30 mL Oral Daily PRN  Chalmers Guest, NP       OLANZapine zydis (ZYPREXA) disintegrating tablet 10 mg  10 mg Oral Q8H PRN Chalmers Guest, NP   10 mg at 02/03/21 2342   And   ziprasidone (GEODON) injection 20 mg  20 mg Intramuscular PRN Chalmers Guest, NP       polyethylene glycol (MIRALAX / GLYCOLAX) packet 17 g  17 g Oral Daily PRN Ethelene Hal, NP       senna-docusate (Senokot-S) tablet 1 tablet  1 tablet Oral Daily Corky Sox, MD   1 tablet at 02/11/21 1027   traZODone (DESYREL) tablet 50 mg  50 mg Oral QHS PRN Chalmers Guest, NP   50 mg at 02/10/21 2050    Lab Results:  Results for orders placed or performed during the [name] encounter of 01/28/21 (from the past 48 hour(s))  Glucose, capillary     Status: Abnormal   Collection Time: 02/09/21  5:22 PM  Result Value Ref Range   Glucose-Capillary 299 (H) 70 - 99 mg/dL    Comment: Glucose reference range applies only to samples taken after fasting for at least 8 hours.  Glucose, capillary     Status: Abnormal   Collection Time: 02/09/21  7:40 PM  Result Value Ref Range   Glucose-Capillary 275 (H) 70 - 99 mg/dL    Comment: Glucose reference range applies only to samples taken after fasting for at least 8 hours.   Ammonia     Status: None   Collection Time: 02/10/21  6:42 AM  Result Value Ref Range   Ammonia 28 9 - 35 umol/L    Comment: Performed at [name], [place] [address] [place], [place] [address]  Lipase, blood     Status: Abnormal   Collection Time: 02/10/21  6:42 AM  Result Value Ref Range   Lipase 64 (H) 11 - 51 U/L    Comment: Performed at [name], [place] [address] [place], [place] [address]  Comprehensive metabolic panel     Status: Abnormal   Collection Time: 02/10/21  6:42 AM  Result Value Ref Range   Sodium 136 135 - 145 mmol/L   Potassium 4.0 3.5 - 5.1 mmol/L   Chloride 100 98 - 111 mmol/L   CO2 26 22 - 32 mmol/L   Glucose, Bld 269 (H) 70 - 99 mg/dL    Comment: Glucose reference range applies only to samples taken after fasting for at least 8 hours.   BUN 18 6 - 20 mg/dL   Creatinine, Ser 1.08 0.61 - 1.24 mg/dL   Calcium 9.5 8.9 - 10.3 mg/dL   Total Protein 8.2 (H) 6.5 - 8.1 g/dL   Albumin 4.1 3.5 - 5.0 g/dL   AST 39 15 - 41 U/L   ALT 62 (H) 0 - 44 U/L   Alkaline Phosphatase 136 (H) 38 - 126 U/L   Total Bilirubin 0.4 0.3 - 1.2 mg/dL   GFR, Estimated >60 >60 mL/min    Comment: (NOTE) Calculated using the CKD-EPI Creatinine Equation (2021)    Anion gap 10 5 - 15    Comment: Performed at [facility], [place] [address] [place], [place] [address]  CBC with Differential/Platelet     Status: None   Collection Time: 02/10/21  6:42 AM  Result Value Ref Range   WBC 7.1 4.0 - 10.5 K/uL   RBC 4.76 4.22 - 5.81 MIL/uL   Hemoglobin 14.8 13.0 - 17.0 g/dL  HCT 45.3 39.0 - 52.0 %   MCV 95.2 80.0 - 100.0 fL   MCH 31.1 26.0 - 34.0 pg   MCHC 32.7 30.0 - 36.0 g/dL   RDW 12.8 11.5 - 15.5 %   Platelets 238 150 - 400 K/uL   nRBC 0.0 0.0 - 0.2 %   Neutrophils Relative % 44 %   Neutro Abs 3.2 1.7 - 7.7 K/uL   Lymphocytes Relative 40 %   Lymphs Abs 2.8 0.7 - 4.0 K/uL   Monocytes Relative 8 %   Monocytes Absolute 0.5 0.1 -  1.0 K/uL   Eosinophils Relative 7 %   Eosinophils Absolute 0.5 0.0 - 0.5 K/uL   Basophils Relative 1 %   Basophils Absolute 0.1 0.0 - 0.1 K/uL   Immature Granulocytes 0 %   Abs Immature Granulocytes 0.03 0.00 - 0.07 K/uL    Comment: Performed at [facility], [place] [address], [place], [place] [address]  Sedimentation rate     Status: None   Collection Time: 02/10/21  6:42 AM  Result Value Ref Range   Sed Rate 7 0 - 16 mm/hr    Comment: Performed at [name], [place] [address] [place], [place] [address]  Brain natriuretic peptide     Status: None   Collection Time: 02/10/21  6:42 AM  Result Value Ref Range   B Natriuretic Peptide 19.6 0.0 - 100.0 pg/mL    Comment: Performed at [name], [place] [address], [place], [place] [address]  C-reactive protein     Status: Abnormal   Collection Time: 02/10/21  6:42 AM  Result Value Ref Range   CRP 1.4 (H) <1.0 mg/dL    Comment: Performed at [name], [place] [address], [place], [place] [address]  Troponin I (High Sensitivity)     Status: None   Collection Time: 02/10/21  6:42 AM  Result Value Ref Range   Troponin I (High Sensitivity) 4 <18 ng/L    Comment: (NOTE) Elevated high sensitivity troponin I (hsTnI) values and significant  changes across serial measurements may suggest ACS but many other  chronic and acute conditions are known to elevate hsTnI results.  Refer to the "Links" section for chest pain algorithms and additional  guidance. Performed at [facility], [place] [address] [place], [place] [address]   Glucose, capillary     Status: Abnormal   Collection Time: 02/10/21  6:54 AM  Result Value Ref Range   Glucose-Capillary 241 (H) 70 - 99 mg/dL    Comment: Glucose reference range applies only to samples taken after fasting for at least 8 hours.   Comment 1 Notify RN    Comment 2 Document in Chart   Glucose, capillary     Status: Abnormal    Collection Time: 02/10/21 11:50 AM  Result Value Ref Range   Glucose-Capillary 295 (H) 70 - 99 mg/dL    Comment: Glucose reference range applies only to samples taken after fasting for at least 8 hours.  Glucose, capillary     Status: Abnormal   Collection Time: 02/10/21  4:57 PM  Result Value Ref Range   Glucose-Capillary 363 (H) 70 - 99 mg/dL    Comment: Glucose reference range applies only to samples taken after fasting for at least 8 hours.  Glucose, capillary     Status: Abnormal   Collection Time: 02/10/21  7:53 PM  Result Value Ref Range   Glucose-Capillary 511 (HH) 70 - 99  mg/dL    Comment: Glucose reference range applies only to samples taken after fasting for at least 8 hours.  Glucose, capillary     Status: Abnormal   Collection Time: 02/10/21 11:06 PM  Result Value Ref Range   Glucose-Capillary 370 (H) 70 - 99 mg/dL    Comment: Glucose reference range applies only to samples taken after fasting for at least 8 hours.  Glucose, capillary     Status: Abnormal   Collection Time: 02/11/21  5:58 AM  Result Value Ref Range   Glucose-Capillary 245 (H) 70 - 99 mg/dL    Comment: Glucose reference range applies only to samples taken after fasting for at least 8 hours.  Glucose, capillary     Status: Abnormal   Collection Time: 02/11/21 12:01 PM  Result Value Ref Range   Glucose-Capillary 280 (H) 70 - 99 mg/dL    Comment: Glucose reference range applies only to samples taken after fasting for at least 8 hours.    Blood Alcohol level:  Lab Results  Component Value Date   ETH <10 01/26/2021   ETH <10 87/86/7672    Metabolic Disorder Labs: Lab Results  Component Value Date   HGBA1C 11.1 (H) 01/26/2021   MPG 271.87 01/26/2021   MPG 202.99 01/07/2019   No results found for: PROLACTIN Lab Results  Component Value Date   CHOL 140 01/30/2021   TRIG 95 01/30/2021   HDL 49 01/30/2021   CHOLHDL 2.9 01/30/2021   VLDL 19 01/30/2021   LDLCALC 72 01/30/2021   LDLCALC 67  05/20/2020    Physical Findings: AIMS: not yet assessed  Musculoskeletal: Strength & Muscle Tone: within normal limits Gait & Station: normal Patient leans: N/A  Psychiatric Specialty Exam:  Presentation  General Appearance: appropriate for environment Eye Contact:Fair; Fleeting  Speech: mumbling and rambling quality  Speech Volume:Normal  Handedness:Right   Mood and Affect  Mood: "good"  Affect: constricted, irritable  Thought Process  Thought Processes: Disorganized; Irrevelant  Descriptions of Associations:Tangential  Orientation: disoriented to year, location  Thought Content: frank delusions  History of Schizophrenia/Schizoaffective disorder:Yes  Duration of Psychotic Symptoms:Greater than six months  Hallucinations: denies  Ideas of Reference: none reported  Suicidal Thoughts: none reported Homicidal Thoughts: none reported  Sensorium  Memory:Immediate Poor; Recent Poor; Remote Poor  Judgment:Poor  Insight:Poor   Executive Functions  Concentration:Poor  Attention Span:Poor  Recall:Poor  Fund of Knowledge:Poor  Language:Good   Psychomotor Activity  Psychomotor Activity: normal   Assets  Assets:Communication Skills; Social Support; Resilience   Sleep  Sleep: fair   Physical Exam Vitals reviewed.  Constitutional:      Appearance: He is not ill-appearing or toxic-appearing.  Pulmonary:     Effort: Pulmonary effort is normal.  Neurological:     General: No focal deficit present.     Mental Status: He is alert.   ROS not assessed on this encounter Blood pressure 127/84, pulse 92, temperature 97.6 F (36.4 C), temperature source Oral, resp. rate 16, height 5' 3.5" (1.613 m), weight 91.6 kg, SpO2 100 %. Body mass index is 35.22 kg/m.  Assessment: Schizophrenia HTN T2DM, with insulin dependence   Schizophrenia by hx (r/o schizoaffective d/o bipolar type) -Continue Haldol 10 mg BID for psychosis (home med) -Loxapine 10  mg last dose this morning then stop -Increase Clozapine 12.5 mg bid. Will continue to titrate as possible.  -Senokot-S daily -See below for clozapine labs and monitoring  -Continue Cogentin 1 mg BID for EPS prevention for now, will  discontinue with increase in clozapine -Continue Lithium 450 mg BID for mood stability  Clozapine work up (labs next due 11/22) Monitoring for myocarditis, neutropenia, and paralytic ileus.  -ANC of 3.2  -ESR of 7 CRP of 1.4  -Echo WNL  -EKG: NSR Qtc 446  -Troponin of 4  -Inquire about bowel movements   Agitation med protocol ordered: -Zydis 10 mg q8hr prn for agitation  -Ativan 1 mg once for anxiety and severe agitation -Geodon 20 mg IM once for agitation Will consider changing to haldol PRN   Continue PRN's: Tylenol, Maalox, Atarax, Milk of Magnesia, Trazodone   Medical Management Covid negative CMP: Cr of 1.45 CBC: unremarkable EtOH: <10 UDS: negative TSH: not collected A1C: 11.1 Lipids: WNL  ABD distention, transaminitis According staff this has increased since admission. KUB shows non-obstructive gas pattern with no obvious stool burden. ABD exam benign except for protuberant abdomen. ALT persistently elevated to 62. Concern was for ascites despite negative fluid wave. RUQ [place] negative for hepatic parenchymal disease or biliary disease. For ascites causes, ruled out parenchymal disease of the liver, CHF (BNP WNL), and patient likely does not have cancer. Ammonia WNL. Lipase slightly elevated.  -Hepatitis panel pending  T2DM with insulin dependence Fasting CBG of 241 this AM, highest CBG of 299 yesterday. Unit restriction in evening as patient tends to gorge self during dinner. This is an attempt to better manage blood glucose as patient will also be uptitrating on clozaril. -Continue glargine back to 50U -Continue moderate SSI -Continue QHS correction -Continue 15U pranidal coverage  -Continue home glipizide   HTN Continue home  regimen -Lisinopril 40 mg daily -Amlodipine 10 mg daily -Clonidine 0.2 mg BID   Elevated Cr (resolved) Cr of 1.45 on a baseline of  1-1.2 at admission, likely pre-renal in the context of decreased PO. Cr down to 1.29 then normalized. Creatinine 1.16 on 11/12   France Ravens, MD 02/11/2021, 12:22 PM

## 2021-02-12 LAB — GLUCOSE, CAPILLARY
Glucose-Capillary: 225 mg/dL — ABNORMAL HIGH (ref 70–99)
Glucose-Capillary: 255 mg/dL — ABNORMAL HIGH (ref 70–99)
Glucose-Capillary: 451 mg/dL — ABNORMAL HIGH (ref 70–99)
Glucose-Capillary: 319 mg/dL — ABNORMAL HIGH (ref 70–99)

## 2021-02-12 MED ORDER — CLONIDINE HCL 0.2 MG PO TABS
0.2000 mg | ORAL_TABLET | Freq: Two times a day (BID) | ORAL | Status: DC
  Administered 2021-02-12 – 2021-03-02 (×36): 0.2 mg via ORAL
  Filled 2021-02-12: qty 2
  Filled 2021-02-12 (×20): qty 1
  Filled 2021-02-12: qty 2
  Filled 2021-02-12 (×20): qty 1

## 2021-02-12 NOTE — Progress Notes (Addendum)
[facility] MD Progress Note  02/12/2021 7:15 AM Mitchell [name]  MRN:  [id number] Subjective:   Mitchell [name] is a 60 year old male with a past history of schizophrenia presenting under IVC for acute psychosis. He was exhibiting bizarre behavior (e.g. running through the streets naked).   Yesterday the following changes were made to the patient's regimen: -Start Clozapine 12.5 bid  The patient's chart was reviewed and nursing notes were reviewed. Over the past 24 hrs, the patient received no PRN medications for agitation. Per staff the patient was talking with unseen others. The patient's case was discussed in multidisciplinary team meeting.   TODAY'S INTERVIEW Patient was seen and staffed with Dr. Caswell Corwin. Patient reports that he is doing good today.  Patient denies any side effects from medication changes.  Patient reports that he has been sleeping well, eating well, and has no complaints regarding present hospitalization.  Patient claims that the year is "2 thousand 2020".  When asked to spell this, patient spells 2022.  When asked to spell the word world backwards, patient states DLNOW.  When corrected, patient insists that that is what he said.  Patient is alert and oriented to self, location, and month/year.  Patient states that Edmon Crape is the president and is uncertain who the vice president is.  Patient then suddenly burst out saying "Edmon Crape is my middle name and I am the president".  Patient becomes eagerly intrigued and states that this Probation officer should "check me out.  I am Mitchell [name] and you should see how rich I am".  Patient was pleasant and cooperative throughout assessment.  ROS for clozapine was reassessed today: Malaise: denies Chest pain: denies Shortness of breath: denies Exertional capacity: denies Tachycardia: denies Cough: denies Fever: denies Sedation: denies Orthostatic hypotension (dizziness with standing): denies Hypersalivation: denies Constipation: denies Symptoms  of GERD: denies Nausea: denies Nocturnal enuresis: denies  Discussed plan today to continue changing medications including increasing clozapine. Patient verbalized agreement.  Principal Problem: Schizophrenia ([place]) Diagnosis: Principal Problem:   Schizophrenia ([place])  Total Time Spent in Direct Patient Care: I personally spent 30 minutes on the unit in direct patient care. The direct patient care time included face-to-face time with the patient, reviewing the patient's chart, communicating with other professionals, and coordinating care. Greater than 50% of this time was spent in counseling or coordinating care with the patient regarding goals of hospitalization, psycho-education, and discharge planning needs.   Past Psychiatric History: schizophrenia/schizoaffective disorder  Past Medical History:  Past Medical History:  Diagnosis Date   Bipolar affective disorder ([place])    takes Zyprexa daily   Diabetes mellitus    takes Victoza,Metformin,and Glipizide daily   Hypertension    takes Amlodipine,Lisinopril and Clonidine daily   Hyponatremia    history of   Mental disorder    takes Lithium daily   Schizoaffective disorder    takes Trazodone nightly   Seasonal allergies    takes Claritin daily   Sleep apnea    sleep study >32yr ago   Stroke ([facility]    left arm weakness   Past Surgical History:  Procedure Laterality Date   CATARACT EXTRACTION W/PHACO Right 02/14/2013   Procedure: CATARACT EXTRACTION PHACO AND INTRAOCULAR LENS PLACEMENT ([place]  Surgeon: GAdonis Brook MD;  [place]  Service: Ophthalmology;  Laterality: Right;   CATARACT EXTRACTION W/PHACO Left 06/13/2013   Procedure: CATARACT EXTRACTION PHACO AND INTRAOCULAR LENS PLACEMENT (I[facility]);  Surgeon: GAdonis Brook MD;  Location[place] [facility]  Service: Ophthalmology;  Laterality:  Left;   CIRCUMCISION  20 yrs. ago   EYE SURGERY     Family History: History reviewed. No pertinent family history. Family Psychiatric  History:  reports an Hx of BPAD Social History:  Social History   Substance and Sexual Activity  Alcohol Use Not Currently     Social History   Substance and Sexual Activity  Drug Use No    Social History   Socioeconomic History   Marital status: Divorced    Spouse name: Not on file   Number of children: Not on file   Years of education: Not on file   Highest education level: Not on file  Occupational History   Not on file  Tobacco Use   Smoking status: Never   Smokeless tobacco: Never  Vaping Use   Vaping Use: Never used  Substance and Sexual Activity   Alcohol use: Not Currently   Drug use: No   Sexual activity: Yes    Birth control/protection: None  Other Topics Concern   Not on file  Social History Narrative   Not on file   Social Determinants of Health   Financial Resource Strain: Not on file  Food Insecurity: Not on file  Transportation Needs: Not on file  Physical Activity: Not on file  Stress: Not on file  Social Connections: Not on file   Additional Social History:     Sleep: Fair  Appetite:  Fair  Current Medications: Current Facility-Administered Medications  Medication Dose Route Frequency Provider Last Rate Last Admin   acetaminophen (TYLENOL) tablet 650 mg  650 mg Oral Q6H PRN Chalmers Guest, NP       alum & mag hydroxide-simeth (MAALOX/MYLANTA) 200-200-20 MG/5ML suspension 30 mL  30 mL Oral Q4H PRN Chalmers Guest, NP       amLODipine (NORVASC) tablet 10 mg  10 mg Oral Daily Chalmers Guest, NP   10 mg at 02/11/21 0810   aspirin EC tablet 81 mg  81 mg Oral Daily Chalmers Guest, NP   81 mg at 02/11/21 0810   benztropine (COGENTIN) tablet 1 mg  1 mg Oral BID Chalmers Guest, NP   1 mg at 02/11/21 1742   cloNIDine (CATAPRES) tablet 0.1 mg  0.1 mg Oral BID Aleanna Menge, Ovid Curd, MD   0.1 mg at 02/11/21 1741   cloZAPine (CLOZARIL) tablet 12.5 mg  12.5 mg Oral BID Gal Feldhaus, Ovid Curd, MD   12.5 mg at 02/11/21 1742   cloZAPine (CLOZARIL) tablet 25 mg  25 mg Oral  BID Audryanna Zurita, Ovid Curd, MD       glipiZIDE (GLUCOTROL XL) 24 hr tablet 10 mg  10 mg Oral Q breakfast Chalmers Guest, NP   10 mg at 02/11/21 7353   haloperidol (HALDOL) tablet 15 mg  15 mg Oral BID Nasiya Pascual, Ovid Curd, MD   15 mg at 02/11/21 1741   insulin aspart (novoLOG) injection 0-15 Units  0-15 Units Subcutaneous TID WC Hill, Jackie Plum, MD   5 Units at 02/12/21 0707   insulin aspart (novoLOG) injection 0-5 Units  0-5 Units Subcutaneous QHS Chalmers Guest, NP   3 Units at 02/11/21 2052   insulin aspart (novoLOG) injection 15 Units  15 Units Subcutaneous TID WC Janine Limbo, MD   15 Units at 02/12/21 0706   insulin glargine-yfgn (SEMGLEE) injection 50 Units  50 Units Subcutaneous Daily Kayson Bullis, Ovid Curd, MD   50 Units at 02/11/21 0810   lisinopril (ZESTRIL) tablet 40 mg  40 mg Oral Daily Pecolia Ades  R, NP   40 mg at 02/11/21 0811   lithium carbonate (ESKALITH) CR tablet 450 mg  450 mg Oral Q12H Chalmers Guest, NP   450 mg at 02/11/21 2051   magnesium hydroxide (MILK OF MAGNESIA) suspension 30 mL  30 mL Oral Daily PRN Chalmers Guest, NP       OLANZapine zydis (ZYPREXA) disintegrating tablet 10 mg  10 mg Oral Q8H PRN Chalmers Guest, NP   10 mg at 02/03/21 2342   And   ziprasidone (GEODON) injection 20 mg  20 mg Intramuscular PRN Chalmers Guest, NP       polyethylene glycol (MIRALAX / GLYCOLAX) packet 17 g  17 g Oral Daily PRN Ethelene Hal, NP       senna-docusate (Senokot-S) tablet 1 tablet  1 tablet Oral Daily Corky Sox, MD   1 tablet at 02/11/21 0272   traZODone (DESYREL) tablet 50 mg  50 mg Oral QHS PRN Chalmers Guest, NP   50 mg at 02/11/21 2051    Lab Results:  Results for orders placed or performed during the hospital encounter of 01/28/21 (from the past 48 hour(s))  Glucose, capillary     Status: Abnormal   Collection Time: 02/10/21 11:50 AM  Result Value Ref Range   Glucose-Capillary 295 (H) 70 - 99 mg/dL    Comment: Glucose reference range applies only to  samples taken after fasting for at least 8 hours.  Glucose, capillary     Status: Abnormal   Collection Time: 02/10/21  4:57 PM  Result Value Ref Range   Glucose-Capillary 363 (H) 70 - 99 mg/dL    Comment: Glucose reference range applies only to samples taken after fasting for at least 8 hours.  Glucose, capillary     Status: Abnormal   Collection Time: 02/10/21  7:53 PM  Result Value Ref Range   Glucose-Capillary 511 (HH) 70 - 99 mg/dL    Comment: Glucose reference range applies only to samples taken after fasting for at least 8 hours.  Glucose, capillary     Status: Abnormal   Collection Time: 02/10/21 11:06 PM  Result Value Ref Range   Glucose-Capillary 370 (H) 70 - 99 mg/dL    Comment: Glucose reference range applies only to samples taken after fasting for at least 8 hours.  Glucose, capillary     Status: Abnormal   Collection Time: 02/11/21  5:58 AM  Result Value Ref Range   Glucose-Capillary 245 (H) 70 - 99 mg/dL    Comment: Glucose reference range applies only to samples taken after fasting for at least 8 hours.  Hepatitis panel, acute     Status: None   Collection Time: 02/11/21  6:33 AM  Result Value Ref Range   Hepatitis B Surface Ag NON REACTIVE NON REACTIVE   HCV Ab NON REACTIVE NON REACTIVE    Comment: (NOTE) Nonreactive HCV antibody screen is consistent with no HCV infections,  unless recent infection is suspected or other evidence exists to indicate HCV infection.     Hep A IgM NON REACTIVE NON REACTIVE   Hep B C IgM NON REACTIVE NON REACTIVE    Comment: Performed at [facility], [place] [address], [place], [place] [address]  Glucose, capillary     Status: Abnormal   Collection Time: 02/11/21 12:01 PM  Result Value Ref Range   Glucose-Capillary 280 (H) 70 - 99 mg/dL    Comment: Glucose reference range applies only to samples taken after  fasting for at least 8 hours.  Glucose, capillary     Status: Abnormal   Collection Time: 02/11/21  5:32 PM  Result Value  Ref Range   Glucose-Capillary 273 (H) 70 - 99 mg/dL    Comment: Glucose reference range applies only to samples taken after fasting for at least 8 hours.  Glucose, capillary     Status: Abnormal   Collection Time: 02/11/21  8:02 PM  Result Value Ref Range   Glucose-Capillary 256 (H) 70 - 99 mg/dL    Comment: Glucose reference range applies only to samples taken after fasting for at least 8 hours.  Glucose, capillary     Status: Abnormal   Collection Time: 02/12/21  6:30 AM  Result Value Ref Range   Glucose-Capillary 225 (H) 70 - 99 mg/dL    Comment: Glucose reference range applies only to samples taken after fasting for at least 8 hours.    Blood Alcohol level:  Lab Results  Component Value Date   ETH <10 01/26/2021   ETH <10 65/99/3570    Metabolic Disorder Labs: Lab Results  Component Value Date   HGBA1C 11.1 (H) 01/26/2021   MPG 271.87 01/26/2021   MPG 202.99 01/07/2019   No results found for: PROLACTIN Lab Results  Component Value Date   CHOL 140 01/30/2021   TRIG 95 01/30/2021   HDL 49 01/30/2021   CHOLHDL 2.9 01/30/2021   VLDL 19 01/30/2021   LDLCALC 72 01/30/2021   LDLCALC 67 05/20/2020    Physical Findings: AIMS: not yet assessed  Musculoskeletal: Strength & Muscle Tone: within normal limits Gait & Station: normal Patient leans: N/A  Psychiatric Specialty Exam:  Presentation  General Appearance: appropriate for environment Eye Contact:Fair; Fleeting  Speech: mumbling and rambling quality  Speech Volume:Normal  Handedness:Right   Mood and Affect  Mood: "good"  Affect: constricted, irritable  Thought Process  Thought Processes: Disorganized; Irrevelant  Descriptions of Associations:Tangential  Orientation: disoriented to year, location  Thought Content: frank delusions  History of Schizophrenia/Schizoaffective disorder:Yes  Duration of Psychotic Symptoms:Greater than six months  Hallucinations: denies  Ideas of Reference: none  reported  Suicidal Thoughts: none reported Homicidal Thoughts: none reported  Sensorium  Memory:Immediate Poor; Recent Poor; Remote Poor  Judgment:Poor  Insight:Poor   Executive Functions  Concentration:Poor  Attention Span:Poor  Recall:Poor  Fund of Knowledge:Poor  Language:Good   Psychomotor Activity  Psychomotor Activity: normal   Assets  Assets:Communication Skills; Social Support; Resilience   Sleep  Sleep: fair   Physical Exam Vitals reviewed.  Constitutional:      Appearance: He is not ill-appearing or toxic-appearing.  Pulmonary:     Effort: Pulmonary effort is normal.  Neurological:     General: No focal deficit present.     Mental Status: He is alert.   Blood pressure 127/84, pulse 92, temperature 97.6 F (36.4 C), temperature source Oral, resp. rate 16, height 5' 3.5" (1.613 m), weight 91.6 kg, SpO2 100 %. Body mass index is 35.22 kg/m.  Assessment: Schizophrenia HTN T2DM, with insulin dependence   Schizophrenia by hx (r/o schizoaffective d/o bipolar type) -Continue Haldol 15 mg BID for psychosis  -Increase Clozapine 12.5 mg am/25 mg pm. Will continue increasing dosage by a total of 12.5 mg, per day, as tolerated.  -Continue Senokot-S daily -Continue Cogentin 1 mg BID for EPS prophylaxis -Continue Lithium 450 mg BID for mood stability  Clozapine work up (labs next due 11/22) Monitoring for myocarditis, neutropenia, and hypomotility syndrome   -ANC of 3.2  -  ESR of 7 CRP of 1.4  -Echo WNL  -EKG: NSR Qtc 446  -Troponin of 4  -Inquire about bowel movements   Agitation med protocol ordered: -Zydis 10 mg q8hr prn for agitation  -Ativan 1 mg once for anxiety and severe agitation -Geodon 20 mg IM once for agitation   Continue PRN's: Tylenol, Maalox, Atarax, Milk of Magnesia, Trazodone   Medical Management Covid negative CMP: Cr of 1.45 CBC: unremarkable EtOH: <10 UDS: negative TSH: not collected A1C: 11.1 Lipids: WNL  ABD  distention, transaminitis According staff this has increased since admission. KUB shows non-obstructive gas pattern with no obvious stool burden. ABD exam benign except for protuberant abdomen. ALT persistently elevated to 62. Concern was for ascites despite negative fluid wave. RUQ [place] negative for hepatic parenchymal disease or biliary disease. For ascites causes, ruled out parenchymal disease of the liver, CHF (BNP WNL), and patient likely does not have cancer. Ammonia WNL. Lipase slightly elevated.  -Hepatitis panel negative  T2DM with insulin dependence Fasting CBG of 241 this AM, highest CBG of 299 yesterday. Unit restriction in evening as patient tends to gorge self during dinner. This is an attempt to better manage blood glucose as patient will also be uptitrating on clozaril. -Continue glargine 50U -Continue moderate SSI -Continue QHS correction -Continue 15U pranidal coverage  -Continue home glipizide   HTN Continue home regimen -Lisinopril 40 mg daily -Amlodipine 10 mg daily -Clonidine 0.2 mg BID   Elevated Cr (resolved) Cr of 1.45 on a baseline of  1-1.2 at admission, likely pre-renal in the context of decreased PO. Cr down to 1.29 then normalized. Creatinine 1.16 on 11/12   France Ravens, MD 02/12/2021, 7:15 AM  Total Time Spent in Direct Patient Care:  I personally spent 30 minutes on the unit in direct patient care. The direct patient care time included face-to-face time with the patient, reviewing the patient's chart, communicating with other professionals, and coordinating care. Greater than 50% of this time was spent in counseling or coordinating care with the patient regarding goals of hospitalization, psycho-education, and discharge planning needs.  I have independently evaluated the patient during a face-to-face assessment on 02/12/21. I reviewed the patient's chart, and I participated in key portions of the service. I discussed the case with the [employer], and I agree  with the assessment and plan of care as documented in the [employer] note, as addended by me or notated below:  I directly edited the resident note, as above.   Janine Limbo, MD Psychiatrist

## 2021-02-12 NOTE — Group Note (Signed)
Occupational Therapy Group Note  Group Topic:Self-Esteem  Group Date: 02/12/2021 Start Time: 1400 End Time: 1435 Facilitators: Donne Hazel, OT/L   Group Description: Group encouraged increased engagement and participation through discussion and activity focused on self-esteem. Patients explored and discussed the differences between healthy and low self-esteem and how it affects our daily lives and occupations with a focus on relationships, work, school, self-care, and personal leisure interests. Group discussion then transitioned into identifying specific strategies to boost self-esteem and engaged in a collaborative and independent activity looking at positive ways to describe oneself A-Z.   Therapeutic Goal(s): Understand and recognize the differences between healthy and low self-esteem Identify healthy strategies to improve/build self-esteem  Participation Level: Active   Participation Quality: Minimal Cues   Behavior: Cooperative   Speech/Thought Process: Disorganized   Affect/Mood: Flat   Insight: Limited   Judgement: Limited   Individualization: [place] was active in their participation of group discussion/activity, though engaged in a disorganized fashion. Pt identified "baseball" as something he can engage in to boost his self-esteem. Pt filled out worksheet, however when sharing, was disorganized and limited in his insight.   Modes of Intervention: Activity, Discussion, and Education  Patient Response to Interventions:  Attentive   Plan: Continue to engage patient in OT groups 2 - 3x/week.  02/12/2021  Donne Hazel, OT/L

## 2021-02-12 NOTE — Progress Notes (Signed)
  Patient is compliant with medications per Provider order. Support and encouragement provided. Routine safety checks conducted every 15 minutes. Patient notified to inform staff with problems or concerns. No adverse drug reactions noted. Patient contracts for safety at this time. Will continue to monitor Patient.

## 2021-02-12 NOTE — Progress Notes (Signed)
   02/12/21 1200  Psych Admission Type (Psych Patients Only)  Admission Status Involuntary  Psychosocial Assessment  Patient Complaints None  Eye Contact Fair  Facial Expression Animated  Affect Appropriate to circumstance  Speech Logical/coherent  Interaction Assertive  Motor Activity Slow  Appearance/Hygiene Unremarkable  Behavior Characteristics Cooperative  Mood Preoccupied  Thought Process  Coherency Blocking  Content Confabulation;Delusions  Delusions None reported or observed  Perception Derealization;Hallucinations  Hallucination Auditory  Judgment Limited  Confusion None  Danger to Self  Current suicidal ideation? Denies  Danger to Others  Danger to Others None reported or observed

## 2021-02-12 NOTE — [facility] Group Notes (Signed)
Adult Psychoeducational Group Note  Date:  02/12/2021 Time:  9:48 AM  Group Topic/Focus:  Goals Group:   The focus of this group is to help patients establish daily goals to achieve during treatment and discuss how the patient can incorporate goal setting into their daily lives to aide in recovery.  Participation Level:  Did Not Attend   Donell Beers 02/12/2021, 9:48 AM

## 2021-02-12 NOTE — Group Note (Signed)
Recreation Therapy Group Note   Group Topic:Team Building  Group Date: 02/12/2021 Start Time: 1000 End Time: 1035 Facilitators: Caroll Rancher, LRT,CTRS Location: [address] Day[address]   Goal Area(s) Addresses:  Patient will effectively work with peer towards shared goal.  Patient will identify skill used to make activity successful.  Patient will identify how skills used during activity can be used to reach post d/c goals.   Group Description:  Patient(s) were given a set of solo cups, a rubber band, and some tied strings. The objective is to build a pyramid with the cups by only using the rubber band and string to move the cups. After the activity the patient(s) and LRT debriefed and discussed what strategies worked, what didn't, and what lessons they can take from the activity and use in life post discharge.    Affect/Mood: N/A   Participation Level: Did not attend    Clinical Observations/Individualized Feedback: Pt did not attend group.    Plan: Continue to engage patient in RT group sessions 2-3x/week.   Caroll Rancher, LRT,CTRS 02/12/2021 11:40 AM

## 2021-02-13 ENCOUNTER — Encounter (HOSPITAL_COMMUNITY): Payer: Self-pay

## 2021-02-13 LAB — GLUCOSE, CAPILLARY
Glucose-Capillary: 163 mg/dL — ABNORMAL HIGH (ref 70–99)
Glucose-Capillary: 288 mg/dL — ABNORMAL HIGH (ref 70–99)
Glucose-Capillary: 361 mg/dL — ABNORMAL HIGH (ref 70–99)
Glucose-Capillary: 371 mg/dL — ABNORMAL HIGH (ref 70–99)

## 2021-02-13 MED ORDER — INSULIN ASPART 100 UNIT/ML IJ SOLN
20.0000 [IU] | Freq: Three times a day (TID) | INTRAMUSCULAR | Status: DC
  Administered 2021-02-13 – 2021-02-17 (×12): 20 [IU] via SUBCUTANEOUS

## 2021-02-13 MED ORDER — CLOZAPINE 25 MG PO TABS
37.5000 mg | ORAL_TABLET | Freq: Two times a day (BID) | ORAL | Status: AC
  Administered 2021-02-14 – 2021-02-16 (×4): 37.5 mg via ORAL
  Filled 2021-02-13 (×4): qty 2

## 2021-02-13 MED ORDER — ATENOLOL 12.5 MG HALF TABLET
12.5000 mg | ORAL_TABLET | Freq: Every day | ORAL | Status: DC
  Administered 2021-02-13 – 2021-03-02 (×17): 12.5 mg via ORAL
  Filled 2021-02-13 (×20): qty 1

## 2021-02-13 MED ORDER — CLOZAPINE 25 MG PO TABS
50.0000 mg | ORAL_TABLET | Freq: Two times a day (BID) | ORAL | Status: DC
  Filled 2021-02-13 (×4): qty 2

## 2021-02-13 NOTE — Progress Notes (Signed)
Inpatient Diabetes Program Recommendations  AACE/ADA: New Consensus Statement on Inpatient Glycemic Control   Target Ranges:  Prepandial:   less than 140 mg/dL      Peak postprandial:   less than 180 mg/dL (1-2 hours)      Critically ill patients:  140 - 180 mg/dL    Latest Reference Range & Units 02/12/21 06:30 02/12/21 11:49 02/12/21 16:41 02/12/21 20:27 02/13/21 05:29  Glucose-Capillary 70 - 99 mg/dL 998 (H) 338 (H) 250 (H) 319 (H) 163 (H)  (H): Data is abnormally high  Review of Glycemic Control  Current orders for Inpatient glycemic control: Semglee 50 units daily, Novolog 15 units TID with meals, Glipizide XL 10 mg QAM, Novolog 0-15 units TID with meals, Novolog 0-5 units QHS  Inpatient Diabetes Program Recommendations:    Insulin: Please consider increasing meal coverage Novolog 20 units TID with meals if patient eats at least 50% of meals.  Thanks, Orlando Penner, RN, MSN, CDE Diabetes Coordinator Inpatient Diabetes Program [phone] (Team Pager from 8am to 5pm)

## 2021-02-13 NOTE — Progress Notes (Signed)
Pt was encouraged to go to psycho-ed group but didn't attend. 

## 2021-02-13 NOTE — Progress Notes (Addendum)
[facility] MD Progress Note  02/13/2021 2:17 PM JDEN WANT  MRN:  [id number] Subjective:   Mitchell [name] is a 60 year old male with a past history of schizophrenia presenting under IVC for acute psychosis. He was exhibiting bizarre behavior (e.g. running through the streets naked).   Yesterday the following changes were made to the patient's regimen: -Start Clozapine 12.5 mg in the morning 25 mg at night  The patient's chart was reviewed and nursing notes were reviewed. Over the past 24 hrs, the patient received no PRN medications for agitation. Per staff the patient was talking with unseen others. The patient's case was discussed in multidisciplinary team meeting.   TODAY'S INTERVIEW Patient was seen and staffed with Dr. Caswell Corwin.  Patient is interviewed in his hospital room.  He continues to appear somnolent.  His thought process is less tangential. Patient denies any side effects from medication changes.  Patient reports that he has been sleeping well, eating well, and has no complaints regarding present hospitalization.  Patient continues to claim that the year is "2 thousand 2020" which appears to be how patient references 2022.  Patient stated that the date was November 21.  When corrected to state that it was November 18, patient insisted that that is what he said. Patient is alert and oriented to self, location, and month/year.  Patient states that Edmon Crape is the president and that he is Edmon Crape.  Patient was pleasant and cooperative throughout assessment.  He denies having suicidal thoughts.  He denies having homicidal thoughts.   ROS for clozapine was reassessed today: Malaise: denies Chest pain: denies Shortness of breath: denies Exertional capacity: denies Tachycardia: denies Cough: denies Fever: denies Sedation: denies Orthostatic hypotension (dizziness with standing): denies Hypersalivation: denies Constipation: denies Symptoms of GERD: denies Nausea: denies Nocturnal  enuresis: denies  Discussed plan today to continue changing medications including increasing clozapine. Patient verbalized agreement.  Principal Problem: Schizophrenia ([place]) Diagnosis: Principal Problem:   Schizophrenia ([place])   Past Psychiatric History: schizophrenia/schizoaffective disorder  Past Medical History:  Past Medical History:  Diagnosis Date   Bipolar affective disorder ([place])    takes Zyprexa daily   Diabetes mellitus    takes Victoza,Metformin,and Glipizide daily   Hypertension    takes Amlodipine,Lisinopril and Clonidine daily   Hyponatremia    history of   Mental disorder    takes Lithium daily   Schizoaffective disorder    takes Trazodone nightly   Seasonal allergies    takes Claritin daily   Sleep apnea    sleep study >30yr ago   Stroke ([facility]    left arm weakness   Past Surgical History:  Procedure Laterality Date   CATARACT EXTRACTION W/PHACO Right 02/14/2013   Procedure: CATARACT EXTRACTION PHACO AND INTRAOCULAR LENS PLACEMENT ([place];  Surgeon: GAdonis Brook MD;  [place]  Service: Ophthalmology;  Laterality: Right;   CATARACT EXTRACTION W/PHACO Left 06/13/2013   Procedure: CATARACT EXTRACTION PHACO AND INTRAOCULAR LENS PLACEMENT ([facility]);  Surgeon: GAdonis Brook MD;  [place]  Service: Ophthalmology;  Laterality: Left;   CIRCUMCISION  20 yrs. ago   EYE SURGERY     Family History: History reviewed. No pertinent family history. Family Psychiatric  History: reports an Hx of BPAD Social History:  Social History   Substance and Sexual Activity  Alcohol Use Not Currently     Social History   Substance and Sexual Activity  Drug Use No    Social History   Socioeconomic History   Marital status:  Divorced    Spouse name: Not on file   Number of children: Not on file   Years of education: Not on file   Highest education level: Not on file  Occupational History   Not on file  Tobacco Use   Smoking status: Never   Smokeless tobacco:  Never  Vaping Use   Vaping Use: Never used  Substance and Sexual Activity   Alcohol use: Not Currently   Drug use: No   Sexual activity: Yes    Birth control/protection: None  Other Topics Concern   Not on file  Social History Narrative   Not on file   Social Determinants of Health   Financial Resource Strain: Not on file  Food Insecurity: Not on file  Transportation Needs: Not on file  Physical Activity: Not on file  Stress: Not on file  Social Connections: Not on file   Additional Social History:     Sleep: Fair  Appetite:  Fair  Current Medications: Current Facility-Administered Medications  Medication Dose Route Frequency Provider Last Rate Last Admin   acetaminophen (TYLENOL) tablet 650 mg  650 mg Oral Q6H PRN Chalmers Guest, NP       alum & mag hydroxide-simeth (MAALOX/MYLANTA) 200-200-20 MG/5ML suspension 30 mL  30 mL Oral Q4H PRN Chalmers Guest, NP       amLODipine (NORVASC) tablet 10 mg  10 mg Oral Daily Chalmers Guest, NP   10 mg at 02/13/21 0815   aspirin EC tablet 81 mg  81 mg Oral Daily Chalmers Guest, NP   81 mg at 02/13/21 0814   benztropine (COGENTIN) tablet 1 mg  1 mg Oral BID Chalmers Guest, NP   1 mg at 02/13/21 2585   cloNIDine (CATAPRES) tablet 0.2 mg  0.2 mg Oral BID Vonte Rossin, Ovid Curd, MD   0.2 mg at 02/13/21 2778   cloZAPine (CLOZARIL) tablet 25 mg  25 mg Oral BID Marji Kuehnel, Ovid Curd, MD   25 mg at 02/13/21 0815   glipiZIDE (GLUCOTROL XL) 24 hr tablet 10 mg  10 mg Oral Q breakfast Chalmers Guest, NP   10 mg at 02/13/21 2423   haloperidol (HALDOL) tablet 15 mg  15 mg Oral BID Serai Tukes, Ovid Curd, MD   15 mg at 02/13/21 0815   insulin aspart (novoLOG) injection 0-15 Units  0-15 Units Subcutaneous TID WC Hill, Jackie Plum, MD   8 Units at 02/13/21 1207   insulin aspart (novoLOG) injection 0-5 Units  0-5 Units Subcutaneous QHS Chalmers Guest, NP   4 Units at 02/12/21 2031   insulin aspart (novoLOG) injection 20 Units  20 Units Subcutaneous TID WC France Ravens, MD       insulin glargine-yfgn [facility]) injection 50 Units  50 Units Subcutaneous Daily Tauheed Mcfayden, Ovid Curd, MD   50 Units at 02/13/21 0819   lisinopril (ZESTRIL) tablet 40 mg  40 mg Oral Daily Chalmers Guest, NP   40 mg at 02/13/21 0815   lithium carbonate (ESKALITH) CR tablet 450 mg  450 mg Oral Q12H Chalmers Guest, NP   450 mg at 02/13/21 0815   magnesium hydroxide (MILK OF MAGNESIA) suspension 30 mL  30 mL Oral Daily PRN Chalmers Guest, NP       OLANZapine zydis (ZYPREXA) disintegrating tablet 10 mg  10 mg Oral Q8H PRN Chalmers Guest, NP   10 mg at 02/03/21 2342   And   ziprasidone (GEODON) injection 20 mg  20 mg Intramuscular PRN Dolby,  Craige Cotta, NP       polyethylene glycol (MIRALAX / GLYCOLAX) packet 17 g  17 g Oral Daily PRN Ethelene Hal, NP       senna-docusate (Senokot-S) tablet 1 tablet  1 tablet Oral Daily Corky Sox, MD   1 tablet at 02/13/21 1206   traZODone (DESYREL) tablet 50 mg  50 mg Oral QHS PRN Chalmers Guest, NP   50 mg at 02/12/21 2035    Lab Results:  Results for orders placed or performed during the hospital encounter of 01/28/21 (from the past 48 hour(s))  Glucose, capillary     Status: Abnormal   Collection Time: 02/11/21  5:32 PM  Result Value Ref Range   Glucose-Capillary 273 (H) 70 - 99 mg/dL    Comment: Glucose reference range applies only to samples taken after fasting for at least 8 hours.  Glucose, capillary     Status: Abnormal   Collection Time: 02/11/21  8:02 PM  Result Value Ref Range   Glucose-Capillary 256 (H) 70 - 99 mg/dL    Comment: Glucose reference range applies only to samples taken after fasting for at least 8 hours.  Glucose, capillary     Status: Abnormal   Collection Time: 02/12/21  6:30 AM  Result Value Ref Range   Glucose-Capillary 225 (H) 70 - 99 mg/dL    Comment: Glucose reference range applies only to samples taken after fasting for at least 8 hours.  Glucose, capillary     Status: Abnormal   Collection Time:  02/12/21 11:49 AM  Result Value Ref Range   Glucose-Capillary 255 (H) 70 - 99 mg/dL    Comment: Glucose reference range applies only to samples taken after fasting for at least 8 hours.  Glucose, capillary     Status: Abnormal   Collection Time: 02/12/21  4:41 PM  Result Value Ref Range   Glucose-Capillary 451 (H) 70 - 99 mg/dL    Comment: Glucose reference range applies only to samples taken after fasting for at least 8 hours.  Glucose, capillary     Status: Abnormal   Collection Time: 02/12/21  8:27 PM  Result Value Ref Range   Glucose-Capillary 319 (H) 70 - 99 mg/dL    Comment: Glucose reference range applies only to samples taken after fasting for at least 8 hours.  Glucose, capillary     Status: Abnormal   Collection Time: 02/13/21  5:29 AM  Result Value Ref Range   Glucose-Capillary 163 (H) 70 - 99 mg/dL    Comment: Glucose reference range applies only to samples taken after fasting for at least 8 hours.   Comment 1 Notify RN   Glucose, capillary     Status: Abnormal   Collection Time: 02/13/21 12:03 PM  Result Value Ref Range   Glucose-Capillary 288 (H) 70 - 99 mg/dL    Comment: Glucose reference range applies only to samples taken after fasting for at least 8 hours.    Blood Alcohol level:  Lab Results  Component Value Date   ETH <10 01/26/2021   ETH <10 35/36/1443    Metabolic Disorder Labs: Lab Results  Component Value Date   HGBA1C 11.1 (H) 01/26/2021   MPG 271.87 01/26/2021   MPG 202.99 01/07/2019   No results found for: PROLACTIN Lab Results  Component Value Date   CHOL 140 01/30/2021   TRIG 95 01/30/2021   HDL 49 01/30/2021   CHOLHDL 2.9 01/30/2021   VLDL 19 01/30/2021   LDLCALC 72 01/30/2021  [place] 67 05/20/2020    Physical Findings: AIMS: not yet assessed  Musculoskeletal: Strength & Muscle Tone: within normal limits Gait & Station: normal Patient leans: N/A  Psychiatric Specialty Exam:  Presentation  General Appearance: appropriate  for environment Eye Contact:Fair; Fleeting  Speech: mumbling and rambling quality  Speech Volume:Normal  Handedness:Right   Mood and Affect  Mood: "good"  Affect: Appropriate  Thought Process  Thought Processes: Disorganized; Irrevelant  Descriptions of Associations: Less Tangential  Orientation: disoriented to year, location  Thought Content: frank delusions of being Edmon Crape the president  History of Schizophrenia/Schizoaffective disorder:Yes  Duration of Psychotic Symptoms:Greater than six months  Hallucinations: denies  Ideas of Reference: none reported  Suicidal Thoughts: none reported Homicidal Thoughts: none reported  Sensorium  Memory:Immediate Poor; Recent Poor; Remote Poor  Judgment:Poor  Insight:Poor   Executive Functions  Concentration:Poor  Attention Span:Poor  [place] of Knowledge:Poor  Language:Good   Psychomotor Activity  Psychomotor Activity: normal   Assets  Assets:Communication Skills; Social Support; Resilience   Sleep  Sleep: fair   Physical Exam Vitals reviewed.  Constitutional:      Appearance: He is not ill-appearing or toxic-appearing.  Pulmonary:     Effort: Pulmonary effort is normal.  Neurological:     General: No focal deficit present.     Mental Status: He is alert.   Blood pressure (!) 132/92, pulse (!) 108, temperature 98.8 F (37.1 C), temperature source Oral, resp. rate 16, height 5' 3.5" (1.613 m), weight 91.6 kg, SpO2 100 %. Body mass index is 35.22 kg/m.  Assessment: Schizophrenia HTN T2DM, with insulin dependence Tachycardia    Schizophrenia by hx (r/o schizoaffective d/o bipolar type) -Continue Haldol 15 mg BID for psychosis  -Increase Clozapine 25 mg am/25 mg pm (today 02-13-2021). Will continue increasing dosage by a total of 12.5 mg, per day, as tolerated.  -Continue Senokot-S daily -Continue Cogentin 1 mg BID for EPS prophylaxis -Continue Lithium 450 mg BID for mood  stability -Start Atenolol 12.5 mg once daily - for tachycardia. Pt is asymptomatic - will monitor BP, HR, orthostatic hypotension, and for dizziness. This is recommended by [facility] prescribers guide for tachycardia, 2/2 clozapine.   Clozapine work up (labs next due 11/22) Monitoring for myocarditis, neutropenia, and hypomotility syndrome   -ANC of 3.2  -ESR of 7 CRP of 1.4  -Echo WNL  -EKG: NSR Qtc 446  -Troponin of 4  -Inquire about bowel movements   Agitation med protocol ordered: -Zydis 10 mg q8hr prn for agitation  -Ativan 1 mg once for anxiety and severe agitation -Geodon 20 mg IM once for agitation   Continue PRN's: Tylenol, Maalox, Atarax, Milk of Magnesia, Trazodone   Medical Management Covid negative CMP: Cr of 1.45 CBC: unremarkable EtOH: <10 UDS: negative TSH: not collected A1C: 11.1 Lipids: WNL  ABD distention, transaminitis According staff this has increased since admission. KUB shows non-obstructive gas pattern with no obvious stool burden. ABD exam benign except for protuberant abdomen. ALT persistently elevated to 62. Concern was for ascites despite negative fluid wave. RUQ [place] negative for hepatic parenchymal disease or biliary disease. For ascites causes, ruled out parenchymal disease of the liver, CHF (BNP WNL), and patient likely does not have cancer. Ammonia WNL. Lipase slightly elevated.  -Hepatitis panel negative  T2DM with insulin dependence Patient CBG still tends to be elevated into 200s to 400s. Unit restriction in evening as patient tends to gorge self during dinner. This is an attempt to better manage blood glucose  as patient will also be uptitrating on clozaril. -Continue glargine 50U -Continue moderate SSI -Continue QHS correction -INCREASE 15U pranidal coverage  -Continue home glipizide   HTN Continue home regimen -Lisinopril 40 mg daily -Amlodipine 10 mg daily -Clonidine 0.2 mg BID   Elevated Cr (resolved) Cr of 1.45 on a baseline of   1-1.2 at admission, likely pre-renal in the context of decreased PO. Cr down to 1.29 then normalized. Creatinine 1.16 on 11/12   France Ravens, MD 02/13/2021, 2:17 PM  Total Time Spent in Direct Patient Care:  I personally spent 30 minutes on the unit in direct patient care. The direct patient care time included face-to-face time with the patient, reviewing the patient's chart, communicating with other professionals, and coordinating care. Greater than 50% of this time was spent in counseling or coordinating care with the patient regarding goals of hospitalization, psycho-education, and discharge planning needs.  I have independently evaluated the patient during a face-to-face assessment on 02/13/21. I reviewed the patient's chart, and I participated in key portions of the service. I discussed the case with the [employer], and I agree with the assessment and plan of care as documented in the House Officer's note, as addended by me or notated below:  Patient is still psychotic. His thought process is less tangential.  He appears sedated, and we will monitor this.  Denies dizziness with standing.  Vital signs are negative for orthostatic hypotension.  We will address tachycardia, with atenolol 12.5 mg once daily, as recommended by [facility] prescribers guide.  If patient does have difficulty maintaining blood pressure, or dizziness with standing, there should be consideration to decrease dose of clonidine over the weekend.  I previously decreased the dose of clonidine from 0.2 mg twice daily to 0.1 mg twice daily, and blood pressure increased to a measurement at which I increased the dose back to 0.2 mg twice daily.    Janine Limbo, MD Psychiatrist

## 2021-02-13 NOTE — Group Note (Signed)
LCSW Group Therapy Note  02/13/2021   11am-12pm               Type of Therapy and Topic:  Group Therapy: Understanding the differences between fear and anxiety / Fear Ladder & Examining the Evidence  Participation Level:  Did not attend   Description of Group:   In this group session, patients learned how to define and recognize the similarities differences between fear and anxiety. Patients will explore reactions we have to fear and anxiety. Patients identified a fear or something they feel anxious about. Patients analyzed scenarios and discussed both the positive and negative aspects to them. Patients were asked to identify if it is a fear or anxiety as well.  Patients were asked to provide their own examples. Patients will learn what to do for both fear and anxiety. CSW provided tools such as breaking things up into smaller steps, challenging automatic negative thinking / questions to ask, fear ladder and how to use gradual exposure. Patients will be asked to practice each tool with situations and to complete a fear ladder for their personal gain.   Therapeutic Goals: Patients will learn the difference between fear versus anxiety and the definitions of both. Patients will utilize scenarios and be able to identify if this is an example of a fear or anxiety. Patients will learn tools to handle both their fears and anxieties as well as discuss breaking it into smaller steps and exposure.  Patients will be asked to provide examples of their own and discuss how things have both positive and negative aspects.  Patients were provided questions to ask to challenge automatic negative thinking for anxiety.  Patients were provided a sample form of a fear ladder and asked to complete one for a fear.   Summary of Patient Progress:  Did not attend  Therapeutic Modalities:   Cognitive Behavioral Therapy Motivational Interviewing  Brief Therapy  Beatris Si, LCSW  02/13/2021 12:06 PM

## 2021-02-13 NOTE — [facility] Group Notes (Signed)
Pt didn't attend group. 

## 2021-02-13 NOTE — Group Note (Signed)
Recreation Therapy Group Note   Group Topic:Stress Management  Group Date: 02/13/2021 Start Time: 1000 End Time: 1045 Facilitators: Caroll Rancher, LRT,CTRS Location: [address] Dayroom  Goal Area(s) Addresses:  Patient will review and complete packet supporting identification of stressors and and techniques to combat compounding stress.    Clinical Observations/Individualized Feedback: Due to COVID restrictions on unit, patients were given a stress management packet.  The packet addressed symptoms of stress, triggers for stress, identifying what you can/can't control that leads to stress and techniques to manage your time to reduce stress.     Plan: Continue to engage patient in RT group sessions 2-3x/week.     Caroll Rancher, LRT,CTRS 02/13/2021 11:28 AM

## 2021-02-13 NOTE — BH IP Treatment Plan (Signed)
Interdisciplinary Treatment and Diagnostic Plan Update  02/13/2021 Time of Session:  Mitchell [name] MRN: [id number]  Principal Diagnosis: Schizophrenia [facility])  Secondary Diagnoses: Principal Problem:   Schizophrenia (HCC)   Current Medications:  Current Facility-Administered Medications  Medication Dose Route Frequency Provider Last Rate Last Admin   acetaminophen (TYLENOL) tablet 650 mg  650 mg Oral Q6H PRN Novella Olive, NP       alum & mag hydroxide-simeth (MAALOX/MYLANTA) 200-200-20 MG/5ML suspension 30 mL  30 mL Oral Q4H PRN Novella Olive, NP       amLODipine (NORVASC) tablet 10 mg  10 mg Oral Daily Novella Olive, NP   10 mg at 02/13/21 0815   aspirin EC tablet 81 mg  81 mg Oral Daily Novella Olive, NP   81 mg at 02/13/21 6967   benztropine (COGENTIN) tablet 1 mg  1 mg Oral BID Novella Olive, NP   1 mg at 02/13/21 8938   cloNIDine (CATAPRES) tablet 0.2 mg  0.2 mg Oral BID Massengill, Harrold Donath, MD   0.2 mg at 02/13/21 1017   cloZAPine (CLOZARIL) tablet 25 mg  25 mg Oral BID Massengill, Harrold Donath, MD   25 mg at 02/13/21 0815   glipiZIDE (GLUCOTROL XL) 24 hr tablet 10 mg  10 mg Oral Q breakfast Novella Olive, NP   10 mg at 02/13/21 5102   haloperidol (HALDOL) tablet 15 mg  15 mg Oral BID Massengill, Harrold Donath, MD   15 mg at 02/13/21 0815   insulin aspart (novoLOG) injection 0-15 Units  0-15 Units Subcutaneous TID WC Hill, Shelbie Hutching, MD   3 Units at 02/13/21 0603   insulin aspart (novoLOG) injection 0-5 Units  0-5 Units Subcutaneous QHS Novella Olive, NP   4 Units at 02/12/21 2031   insulin aspart (novoLOG) injection 15 Units  15 Units Subcutaneous TID WC Phineas Inches, MD   15 Units at 02/13/21 0604   insulin glargine-yfgn (SEMGLEE) injection 50 Units  50 Units Subcutaneous Daily Massengill, Harrold Donath, MD   50 Units at 02/13/21 0819   lisinopril (ZESTRIL) tablet 40 mg  40 mg Oral Daily Novella Olive, NP   40 mg at 02/13/21 0815   lithium carbonate (ESKALITH) CR tablet 450 mg   450 mg Oral Q12H Novella Olive, NP   450 mg at 02/13/21 0815   magnesium hydroxide (MILK OF MAGNESIA) suspension 30 mL  30 mL Oral Daily PRN Novella Olive, NP       OLANZapine zydis (ZYPREXA) disintegrating tablet 10 mg  10 mg Oral Q8H PRN Novella Olive, NP   10 mg at 02/03/21 2342   And   ziprasidone (GEODON) injection 20 mg  20 mg Intramuscular PRN Novella Olive, NP       polyethylene glycol (MIRALAX / GLYCOLAX) packet 17 g  17 g Oral Daily PRN Laveda Abbe, NP       senna-docusate (Senokot-S) tablet 1 tablet  1 tablet Oral Daily Carlyn Reichert, MD   1 tablet at 02/12/21 0840   traZODone (DESYREL) tablet 50 mg  50 mg Oral QHS PRN Novella Olive, NP   50 mg at 02/12/21 2035   PTA Medications: Medications Prior to Admission  Medication Sig Dispense Refill Last Dose   amLODipine (NORVASC) 10 MG tablet Take 1 tablet (10 mg total) by mouth daily. (Patient not taking: Reported on 01/26/2021) 90 tablet 1    aspirin 81 MG EC tablet Take 1 tablet (81 mg total)  by mouth daily. Swallow whole. 30 tablet 12    benztropine (COGENTIN) 1 MG tablet Take 1 tablet (1 mg total) by mouth 2 (two) times daily. (Patient not taking: Reported on 01/26/2021) 60 tablet 2    cloNIDine (CATAPRES) 0.2 MG tablet Take 0.2 mg by mouth 2 (two) times daily. (Patient not taking: Reported on 01/26/2021)      fluticasone (FLONASE) 50 MCG/ACT nasal spray Place 1 spray into both nostrils daily as needed for allergies.  (Patient not taking: Reported on 01/26/2021)      glipiZIDE (GLUCOTROL XL) 10 MG 24 hr tablet Take 1 tablet (10 mg total) by mouth daily with breakfast. 90 tablet 1    haloperidol (HALDOL) 20 MG tablet Take 1 tablet (20 mg total) by mouth at bedtime. (Patient not taking: Reported on 01/26/2021) 90 tablet 1    insulin glargine (LANTUS) 100 UNIT/ML injection Inject 10 Units into the skin daily.      insulin lispro (HUMALOG) 100 UNIT/ML KwikPen Inject 2-10 Units into the skin 3 (three) times daily. Per  sliding scale (Patient not taking: Reported on 01/26/2021)      lactulose (CHRONULAC) 10 GM/15ML solution Take 30 mLs by mouth daily as needed for mild constipation.  (Patient not taking: Reported on 01/26/2021)      LEVEMIR FLEXTOUCH 100 UNIT/ML FlexPen Inject 20 Units into the skin at bedtime. (Patient not taking: Reported on 01/26/2021)      liraglutide (VICTOZA) 18 MG/3ML SOPN Inject 1.8 mg into the skin daily. (Patient not taking: Reported on 01/26/2021)      lisinopril (ZESTRIL) 40 MG tablet Take 1 tablet (40 mg total) by mouth daily. (Patient not taking: No sig reported) 90 tablet 1    lithium carbonate (ESKALITH) 450 MG CR tablet Take 1 tablet (450 mg total) by mouth every 12 (twelve) hours. (Patient not taking: Reported on 01/26/2021) 60 tablet 2    metFORMIN (GLUCOPHAGE) 1000 MG tablet Take 1 tablet (1,000 mg total) by mouth 2 (two) times daily with a meal. (Patient not taking: Reported on 01/26/2021) 60 tablet 2    NOVOLOG FLEXPEN 100 UNIT/ML FlexPen Inject 5 Units into the skin with breakfast, with lunch, and with evening meal. (Patient not taking: Reported on 01/26/2021)      tobramycin-dexamethasone (TOBRADEX) ophthalmic solution Place 2 drops into both eyes every 6 (six) hours as needed. (Patient not taking: Reported on 01/26/2021)       Patient Stressors: Health problems   Medication change or noncompliance    Patient Strengths: Capable of independent living  Supportive family/friends   Treatment Modalities: Medication Management, Group therapy, Case management,  1 to 1 session with clinician, Psychoeducation, Recreational therapy.   Physician Treatment Plan for Primary Diagnosis: Schizophrenia (HCC) Long Term Goal(s): Improvement in symptoms so as ready for discharge   Short Term Goals: Compliance with prescribed medications will improve Ability to maintain clinical measurements within normal limits will improve  Medication Management: Evaluate patient's response, side  effects, and tolerance of medication regimen.  Therapeutic Interventions: 1 to 1 sessions, Unit Group sessions and Medication administration.  Evaluation of Outcomes: Progressing  Physician Treatment Plan for Secondary Diagnosis: Principal Problem:   Schizophrenia (HCC)  Long Term Goal(s): Improvement in symptoms so as ready for discharge   Short Term Goals: Compliance with prescribed medications will improve Ability to maintain clinical measurements within normal limits will improve     Medication Management: Evaluate patient's response, side effects, and tolerance of medication regimen.  Therapeutic Interventions: 1 to  1 sessions, Unit Group sessions and Medication administration.  Evaluation of Outcomes: Progressing   RN Treatment Plan for Primary Diagnosis: Schizophrenia (HCC) Long Term Goal(s): Knowledge of disease and therapeutic regimen to maintain health will improve  Short Term Goals: Ability to participate in decision making will improve, Ability to verbalize feelings will improve, and Ability to identify and develop effective coping behaviors will improve  Medication Management: RN will administer medications as ordered by provider, will assess and evaluate patient's response and provide education to patient for prescribed medication. RN will report any adverse and/or side effects to prescribing provider.  Therapeutic Interventions: 1 on 1 counseling sessions, Psychoeducation, Medication administration, Evaluate responses to treatment, Monitor vital signs and CBGs as ordered, Perform/monitor CIWA, COWS, AIMS and Fall Risk screenings as ordered, Perform wound care treatments as ordered.  Evaluation of Outcomes: Progressing   LCSW Treatment Plan for Primary Diagnosis: Schizophrenia (HCC) Long Term Goal(s): Safe transition to appropriate next level of care at discharge, Engage patient in therapeutic group addressing interpersonal concerns.  Short Term Goals: Engage patient  in aftercare planning with referrals and resources, Increase social support, and Increase ability to appropriately verbalize feelings  Therapeutic Interventions: Assess for all discharge needs, 1 to 1 time with Social worker, Explore available resources and support systems, Assess for adequacy in community support network, Educate family and significant other(s) on suicide prevention, Complete Psychosocial Assessment, Interpersonal group therapy.  Evaluation of Outcomes: Progressing   Progress in Treatment: Attending groups: Yes. Participating in groups: Yes. Taking medication as prescribed: Yes. Toleration medication: Yes. Family/Significant other contact made: Yes, individual(s) contacted:  pt declined consents Patient understands diagnosis: No. Discussing patient identified problems/goals with staff: No. Medical problems stabilized or resolved: Yes. Denies suicidal/homicidal ideation: Yes. Issues/concerns per patient self-inventory: No. Other: None  New problem(s) identified: No, Describe:  None  New Short Term/Long Term Goal(s):medication stabilization, elimination of SI thoughts, development of comprehensive mental wellness plan.   Patient Goals:  No goal  Discharge Plan or Barriers: Pt continues to have medications adjustments as needed.  Reason for Continuation of Hospitalization: Delusions  Hallucinations Medication stabilization  Estimated Length of Stay: 3-5 days   Scribe for Treatment Team: Chrys Racer 02/13/2021 11:30 AM

## 2021-02-13 NOTE — [facility] Group Notes (Signed)
[facility] Group Notes:  (Nursing/MHT/Case Management/Adjunct)  Date:  02/13/2021  Time:  2:32 PM  Type of Therapy:   Therapeutic Relaxation group  Participation Level:  Minimal  Participation Quality:  Attentive  Affect:  Appropriate  Cognitive:  Alert and Delusional  Insight:  Improving  Engagement in Group:  Engaged and Improving  Modes of Intervention:  Activity and Exploration  Summary of Progress/Problems: Relaxation music and visualization techniques was used to help reduce stress in the body and and reduce anxiety.   Joell Usman J Georgie Haque 02/13/2021, 2:32 PM

## 2021-02-13 NOTE — [facility] Group Notes (Signed)
  Spirituality group facilitated by Kathleen Argue, [facility].   Group Description: Group focused on topic of hope. Patients participated in facilitated discussion around topic, connecting with one another around experiences and definitions for hope. Group members engaged with visual explorer photos, reflecting on what hope looks like for them today. Group engaged in discussion around how their definitions of hope are present today in hospital.   Modalities: Psycho-social ed, Adlerian, Narrative, MI   Patient Progress: Mitchell [name] attended group and was in and out of participation.  He fell asleep for some period of nap  Chaplain Dyanne Carrel, B[facility] Pager, [phone] 9:49 PM

## 2021-02-13 NOTE — Progress Notes (Signed)
   02/13/21 2100  Psych Admission Type (Psych Patients Only)  Admission Status Involuntary  Psychosocial Assessment  Patient Complaints None  Eye Contact Fair  Facial Expression Animated  Affect Appropriate to circumstance  Speech Logical/coherent  Interaction Assertive  Motor Activity Slow  Appearance/Hygiene Unremarkable  Behavior Characteristics Cooperative  Mood Preoccupied  Thought Process  Coherency Blocking  Content Confabulation;Delusions  Delusions None reported or observed  Perception Derealization;Hallucinations  Hallucination Auditory  Judgment Limited  Confusion None  Danger to Self  Current suicidal ideation? Denies  Danger to Others  Danger to Others None reported or observed

## 2021-02-13 NOTE — Progress Notes (Signed)
[place]/Goals group wasn't held due to covid precautions that are in place. 

## 2021-02-14 LAB — GLUCOSE, CAPILLARY
Glucose-Capillary: 300 mg/dL — ABNORMAL HIGH (ref 70–99)
Glucose-Capillary: 192 mg/dL — ABNORMAL HIGH (ref 70–99)
Glucose-Capillary: 150 mg/dL — ABNORMAL HIGH (ref 70–99)
Glucose-Capillary: 198 mg/dL — ABNORMAL HIGH (ref 70–99)

## 2021-02-14 NOTE — Group Note (Signed)
  [facility] LCSW Group Therapy Note  Date/Time:  02/14/2021 11:15AM-12:00PM  Type of Therapy and Topic:  Group Therapy:  Feelings About Hospitalization  Participation Level:  Minimal   Description of Group This process group involved patients discussing their feelings related to being hospitalized, as well as the benefits they see to being in the hospital.  These feelings and benefits were itemized.  The group then brainstormed specific ways in which they could seek those same benefits when they discharge and return home.  Therapeutic Goals Patient will identify and describe positive and negative feelings related to hospitalization Patient will verbalize benefits of hospitalization to themselves personally Patients will brainstorm together ways they can obtain similar benefits in the outpatient setting, identify barriers to wellness and possible solutions  Summary of Patient Progress:  The patient expressed his primary feelings about being hospitalized are "AO1" which CSW did not understand.  He dozed off for much of group, although at the end when he awoke, he stated, "I could hear everything you were saying."  He was very pleasant about being teased.  Therapeutic Modalities Cognitive Behavioral Therapy Motivational Interviewing    Ambrose Mantle, LCSW 02/14/2021, 12:10 PM

## 2021-02-14 NOTE — [facility] Group Notes (Signed)
.  Psychoeducational Group Note  Date 02/14/2021 Time: 0900-1000    Goal Setting   Purpose of Group: Group Focus: affirmation, clarity of thought, and goals/reality orientation Treatment Modality:  Psychoeducation Interventions utilized were assignment, group exercise, and support  Purpose: To be able to understand and verbalize the reason for their admission to the hospital. To understand that the medication helps with their chemical imbalance but they also need to work on their choices in life. To be challenged to develop a list of 30 positives about themselves. Also introduce the concept that "feelings" are not reality.    Participation Level:  Pt came into the room and promptly fell asleep.   Participation Quality:  inappropriate  Affect:  flat  Cognitive:  inappropriate  Insight: lacking  Engagement in Group:  not Engaged  Additional Comments: the only thing that pt shared was that his name was Mitchell [name], Mitchell [name]

## 2021-02-14 NOTE — Progress Notes (Signed)
Pt was encouraged to go to orientation/goals group but didn't attend.  

## 2021-02-15 LAB — RESP PANEL BY RT-PCR (FLU A&B, COVID) ARPGX2
Influenza A by PCR: NEGATIVE
Influenza B by PCR: NEGATIVE
SARS Coronavirus 2 by RT PCR: NEGATIVE

## 2021-02-15 LAB — GLUCOSE, CAPILLARY
Glucose-Capillary: 188 mg/dL — ABNORMAL HIGH (ref 70–99)
Glucose-Capillary: 157 mg/dL — ABNORMAL HIGH (ref 70–99)
Glucose-Capillary: 113 mg/dL — ABNORMAL HIGH (ref 70–99)
Glucose-Capillary: 279 mg/dL — ABNORMAL HIGH (ref 70–99)

## 2021-02-15 NOTE — [facility] Group Notes (Signed)
Adult Psychoeducational Group Not Date:  02/15/2021 Time:  0900-1045 Group Topic/Focus: PROGRESSIVE RELAXATION. A group where deep breathing is taught and tensing and relaxation muscle groups is used. Imagery is used as well.  Pts are asked to imagine 3 pillars that hold them up when they are not able to hold themselves up.  Participation Level:  inActive  Participation Quality:  lacking  Affect: flat  Cognitive:  Did not introduce himself as Mitchell [name] today.   Insight: lacking  Engagement in Group:  slept in group  Modes of Intervention:  Activity, Discussion, Education, and Support  Additional Comments:  Did  wake up for the end of the group. States that what holds him up is himself, the [place] and his children.   Dione Housekeeper

## 2021-02-15 NOTE — Group Note (Signed)
[facility] LCSW Group Therapy Note  Date/Time:  02/15/2021  11:00AM-12:00PM  Type of Therapy and Topic:  Group Therapy:  Music and Mood  Participation Level:  Minimal   Description of Group: In this process group, members listened to a variety of genres of music and identified that different types of music evoke different responses.  Patients were encouraged to identify music that was soothing for them and music that was energizing for them.  Patients discussed how this knowledge can help with wellness and recovery in various ways including managing depression and anxiety as well as encouraging healthy sleep habits.    Therapeutic Goals: Patients will explore the impact of different varieties of music on mood Patients will verbalize the thoughts they have when listening to different types of music Patients will identify music that is soothing to them as well as music that is energizing to them Patients will discuss how to use this knowledge to assist in maintaining wellness and recovery Patients will explore the use of music as a coping skill  Summary of Patient Progress:  At the beginning of group, patient expressed that he felt "fantastic."  At the end of group, patient expressed that he still felt good.  He dozed off during group, as he usually does.    Therapeutic Modalities: Solution Focused Brief Therapy Activity   Ambrose Mantle, LCSW

## 2021-02-15 NOTE — Progress Notes (Addendum)
   02/15/21 1100  Psych Admission Type (Psych Patients Only)  Admission Status Involuntary  Psychosocial Assessment  Patient Complaints None  Eye Contact Fair  Facial Expression Flat  Affect Appropriate to circumstance  Speech Logical/coherent  Interaction Assertive  Motor Activity Slow  Appearance/Hygiene Unremarkable;Disheveled  Behavior Characteristics Cooperative  Mood Pleasant  Aggressive Behavior  Effect No apparent injury  Thought Process  Coherency Tangential  Content Delusions  Delusions None reported or observed  Perception Derealization;Hallucinations  Hallucination None reported or observed  Judgment Limited  Confusion None  Danger to Self  Current suicidal ideation? Denies  Danger to Others  Danger to Others None reported or observed   D. Pt continues to isolate to room, is calm, and cooperative- pleasant during interactions. Pt reported that he slept really well last night, and stated that he  "felt good". Pt currently denies SI/HI and AVH   A. Labs and vitals monitored. Pt compliant with medications. Pt supported emotionally and encouraged to express concerns and ask questions.   R. Pt remains safe with 15 minute checks. Will continue POC.

## 2021-02-15 NOTE — Progress Notes (Signed)
[facility] MD Progress Note  02/15/2021 7:03 AM AMONTE BROOKOVER  MRN:  [id number] Subjective:   Mitchell [name] is a 60 year old male with a PPHx of Schizophrenia and multiple hospitalizations presents under IVC for acute psychosis.   Psychiatric Team made the following recommendations yesterday: -Increase Clozaril to 37.5 mg BID   Case was discussed in the multidisciplinary team. MAR was reviewed and patient was compliant with medications.   On interview patient reports that he is doing good.  He reports that he slept well last night.  He reports that his appetite is doing good.  He reports no SI, HI, or AVH.  He reports that he is tolerating the Clozaril well.  He states that he has no constipation.  Encouraged him to drink plenty of fluids.  He reports no other concerns at present.    Principal Problem: Schizophrenia (HCC) Diagnosis: Principal Problem:   Schizophrenia (HCC)  Total Time spent with patient: 30 minutes  Past Psychiatric History: Schizophrenia and multiple hospitalizations  Past Medical History:  Past Medical History:  Diagnosis Date   Bipolar affective disorder (HCC)    takes Zyprexa daily   Diabetes mellitus    takes Victoza,Metformin,and Glipizide daily   Hypertension    takes Amlodipine,Lisinopril and Clonidine daily   Hyponatremia    history of   Mental disorder    takes Lithium daily   Schizoaffective disorder    takes Trazodone nightly   Seasonal allergies    takes Claritin daily   Sleep apnea    sleep study >36yrs ago   Stroke [facility])    left arm weakness    Past Surgical History:  Procedure Laterality Date   CATARACT EXTRACTION W/PHACO Right 02/14/2013   Procedure: CATARACT EXTRACTION PHACO AND INTRAOCULAR LENS PLACEMENT (IOC);  Surgeon: Shade Flood, MD;  Location: [facility] OR;  Service: Ophthalmology;  Laterality: Right;   CATARACT EXTRACTION W/PHACO Left 06/13/2013   Procedure: CATARACT EXTRACTION PHACO AND INTRAOCULAR LENS PLACEMENT (IOC);  Surgeon:  Shade Flood, MD;  Location: [facility] OR;  Service: Ophthalmology;  Laterality: Left;   CIRCUMCISION  20 yrs. ago   EYE SURGERY     Family History: History reviewed. No pertinent family history. Family Psychiatric  History: BAPD Social History:  Social History   Substance and Sexual Activity  Alcohol Use Not Currently     Social History   Substance and Sexual Activity  Drug Use No    Social History   Socioeconomic History   Marital status: Divorced    Spouse name: Not on file   Number of children: Not on file   Years of education: Not on file   Highest education level: Not on file  Occupational History   Not on file  Tobacco Use   Smoking status: Never   Smokeless tobacco: Never  Vaping Use   Vaping Use: Never used  Substance and Sexual Activity   Alcohol use: Not Currently   Drug use: No   Sexual activity: Yes    Birth control/protection: None  Other Topics Concern   Not on file  Social History Narrative   Not on file   Social Determinants of Health   Financial Resource Strain: Not on file  Food Insecurity: Not on file  Transportation Needs: Not on file  Physical Activity: Not on file  Stress: Not on file  Social Connections: Not on file   Additional Social History:  Sleep: Good  Appetite:  Good  Current Medications: Current Facility-Administered Medications  Medication Dose Route Frequency Provider Last Rate Last Admin   acetaminophen (TYLENOL) tablet 650 mg  650 mg Oral Q6H PRN Chalmers Guest, NP       alum & mag hydroxide-simeth (MAALOX/MYLANTA) 200-200-20 MG/5ML suspension 30 mL  30 mL Oral Q4H PRN Chalmers Guest, NP       amLODipine (NORVASC) tablet 10 mg  10 mg Oral Daily Chalmers Guest, NP   10 mg at 02/14/21 1000   aspirin EC tablet 81 mg  81 mg Oral Daily Chalmers Guest, NP   81 mg at 02/14/21 0956   atenolol (TENORMIN) tablet 12.5 mg  12.5 mg Oral Daily France Ravens, MD   12.5 mg at 02/14/21 0956   benztropine  (COGENTIN) tablet 1 mg  1 mg Oral BID Chalmers Guest, NP   1 mg at 02/14/21 1623   cloNIDine (CATAPRES) tablet 0.2 mg  0.2 mg Oral BID Massengill, Ovid Curd, MD   0.2 mg at 02/14/21 1623   cloZAPine (CLOZARIL) tablet 37.5 mg  37.5 mg Oral BID France Ravens, MD   37.5 mg at 02/14/21 1620   Followed by   Derrill Memo ON 02/16/2021] cloZAPine (CLOZARIL) tablet 50 mg  50 mg Oral BID France Ravens, MD       glipiZIDE (GLUCOTROL XL) 24 hr tablet 10 mg  10 mg Oral Q breakfast Chalmers Guest, NP   10 mg at 02/14/21 0956   haloperidol (HALDOL) tablet 15 mg  15 mg Oral BID Massengill, Ovid Curd, MD   15 mg at 02/14/21 1621   insulin aspart (novoLOG) injection 0-15 Units  0-15 Units Subcutaneous TID WC Hill, Jackie Plum, MD   3 Units at 02/15/21 [id number]   insulin aspart (novoLOG) injection 0-5 Units  0-5 Units Subcutaneous QHS Chalmers Guest, NP   5 Units at 02/13/21 2038   insulin aspart (novoLOG) injection 20 Units  20 Units Subcutaneous TID WC France Ravens, MD   20 Units at 02/15/21 [id number]   insulin glargine-yfgn [facility]) injection 50 Units  50 Units Subcutaneous Daily Massengill, Ovid Curd, MD   50 Units at 02/14/21 1006   lisinopril (ZESTRIL) tablet 40 mg  40 mg Oral Daily Chalmers Guest, NP   40 mg at 02/14/21 0956   lithium carbonate (ESKALITH) CR tablet 450 mg  450 mg Oral Q12H Chalmers Guest, NP   450 mg at 02/14/21 2052   magnesium hydroxide (MILK OF MAGNESIA) suspension 30 mL  30 mL Oral Daily PRN Chalmers Guest, NP       OLANZapine zydis (ZYPREXA) disintegrating tablet 10 mg  10 mg Oral Q8H PRN Chalmers Guest, NP   10 mg at 02/03/21 2342   And   ziprasidone (GEODON) injection 20 mg  20 mg Intramuscular PRN Chalmers Guest, NP       polyethylene glycol (MIRALAX / GLYCOLAX) packet 17 g  17 g Oral Daily PRN Ethelene Hal, NP       senna-docusate (Senokot-S) tablet 1 tablet  1 tablet Oral Daily Corky Sox, MD   1 tablet at 02/14/21 1013   traZODone (DESYREL) tablet 50 mg  50 mg Oral QHS PRN Chalmers Guest, NP    50 mg at 02/14/21 2052    Lab Results:  Results for orders placed or performed during the hospital encounter of 01/28/21 (from the past 48 hour(s))  Glucose, capillary     Status: Abnormal  Collection Time: 02/13/21 12:03 PM  Result Value Ref Range   Glucose-Capillary 288 (H) 70 - 99 mg/dL    Comment: Glucose reference range applies only to samples taken after fasting for at least 8 hours.  Glucose, capillary     Status: Abnormal   Collection Time: 02/13/21  5:30 PM  Result Value Ref Range   Glucose-Capillary 361 (H) 70 - 99 mg/dL    Comment: Glucose reference range applies only to samples taken after fasting for at least 8 hours.  Glucose, capillary     Status: Abnormal   Collection Time: 02/13/21  7:42 PM  Result Value Ref Range   Glucose-Capillary 371 (H) 70 - 99 mg/dL    Comment: Glucose reference range applies only to samples taken after fasting for at least 8 hours.  Glucose, capillary     Status: Abnormal   Collection Time: 02/14/21  5:47 AM  Result Value Ref Range   Glucose-Capillary 300 (H) 70 - 99 mg/dL    Comment: Glucose reference range applies only to samples taken after fasting for at least 8 hours.  Glucose, capillary     Status: Abnormal   Collection Time: 02/14/21 12:06 PM  Result Value Ref Range   Glucose-Capillary 192 (H) 70 - 99 mg/dL    Comment: Glucose reference range applies only to samples taken after fasting for at least 8 hours.  Glucose, capillary     Status: Abnormal   Collection Time: 02/14/21  5:15 PM  Result Value Ref Range   Glucose-Capillary 150 (H) 70 - 99 mg/dL    Comment: Glucose reference range applies only to samples taken after fasting for at least 8 hours.  Glucose, capillary     Status: Abnormal   Collection Time: 02/14/21  8:05 PM  Result Value Ref Range   Glucose-Capillary 198 (H) 70 - 99 mg/dL    Comment: Glucose reference range applies only to samples taken after fasting for at least 8 hours.  Glucose, capillary     Status:  Abnormal   Collection Time: 02/15/21  5:30 AM  Result Value Ref Range   Glucose-Capillary 188 (H) 70 - 99 mg/dL    Comment: Glucose reference range applies only to samples taken after fasting for at least 8 hours.    Blood Alcohol level:  Lab Results  Component Value Date   ETH <10 01/26/2021   ETH <10 [id number]    Metabolic Disorder Labs: Lab Results  Component Value Date   HGBA1C 11.1 (H) 01/26/2021   MPG 271.87 01/26/2021   MPG 202.99 01/07/2019   No results found for: PROLACTIN Lab Results  Component Value Date   CHOL 140 01/30/2021   TRIG 95 01/30/2021   HDL 49 01/30/2021   CHOLHDL 2.9 01/30/2021   VLDL 19 01/30/2021   LDLCALC 72 01/30/2021   LDLCALC 67 05/20/2020    Physical Findings: AIMS: Facial and Oral Movements Muscles of Facial Expression: None, normal Lips and Perioral Area: None, normal Jaw: None, normal Tongue: None, normal,Extremity Movements Upper (arms, wrists, hands, fingers): None, normal Lower (legs, knees, ankles, toes): None, normal, Trunk Movements Neck, shoulders, hips: None, normal, Overall Severity Severity of abnormal movements (highest score from questions above): None, normal Incapacitation due to abnormal movements: None, normal Patient's awareness of abnormal movements (rate only patient's report): No Awareness, Dental Status Current problems with teeth and/or dentures?: No Does patient usually wear dentures?: No  CIWA:    COWS:     Musculoskeletal: Strength & Muscle Tone: within normal  limits Gait & Station: normal Patient leans: N/A  Psychiatric Specialty Exam:  Presentation  General Appearance: Appropriate for Environment; Bizarre; Casual  Eye Contact:Fair  Speech:Clear and Coherent; Normal Rate  Speech Volume:Normal  Handedness:Right   Mood and Affect  Mood:Euthymic  Affect:Congruent   Thought Process  Thought Processes:Disorganized  Descriptions of Associations:Loose  Orientation:Full (Time, Place  and Person)  Thought Content:Delusions  History of Schizophrenia/Schizoaffective disorder:Yes  Duration of Psychotic Symptoms:Greater than six months  Hallucinations:Hallucinations: None Ideas of Reference:None  Suicidal Thoughts:Suicidal Thoughts: No Homicidal Thoughts:Homicidal Thoughts: No  Sensorium  Memory:Immediate Poor; Recent Poor  Judgment:Poor  Insight:Poor   Executive Functions  Concentration:Fair  Attention Span:Fair  [place]  Language:Good   Psychomotor Activity  Psychomotor Activity:Psychomotor Activity: Normal  Assets  Assets:Communication Skills; Resilience   Sleep  Sleep:Sleep: Good Number of Hours of Sleep: 7.5   Physical Exam: Physical Exam ROS Blood pressure 139/79, pulse 91, temperature 97.7 F (36.5 C), temperature source Oral, resp. rate 18, height 5' 3.5" (1.613 m), weight 91.6 kg, SpO2 98 %. Body mass index is 35.22 kg/m.   Treatment Plan Summary: Daily contact with patient to assess and evaluate symptoms and progress in treatment and Medication management  Izear Iffland is a 60 year old male with a PPHx of Schizophrenia and multiple hospitalizations presents under IVC for acute psychosis.   Patient is doing well with the Clozaril.  He reports no issues with constipation or other side effects.  We will continue to increase his Clozaril by 12.5 mg daily.  We will continue to monitor.    Schizophrenia: -Continue Haldol 15 BID -Increase Clozaril to 37.5 BID -Continue Lithium 450 mg BID -Continue Senokot-S daily -Continue Cogentin 1 mg BID for EPS prophylaxis   HTN: -Continue Lisinopril 40 mg daily -Continue Amlodipine 10 mg daily -Continue Clonidine 0.2 mg BID   Type 2 Diabetes: -Continue glargine 50 units -Continue moderate SSI -Continue QHS correction -Continue 5 units pranidal coverage  -Continue home glipizide   ABD distention, transaminitis: According staff this has increased  since admission. KUB shows non-obstructive gas pattern with no obvious stool burden. ABD exam benign except for protuberant abdomen. ALT persistently elevated to 62. Concern was for ascites despite negative fluid wave. RUQ [place] negative for hepatic parenchymal disease or biliary disease. For ascites causes, ruled out parenchymal disease of the liver, CHF (BNP WNL), and patient likely does not have cancer. Ammonia WNL. Lipase slightly elevated.  -Hepatitis panel negative  Briant Cedar, MD 02/15/2021, 7:03 AM

## 2021-02-15 NOTE — Progress Notes (Signed)
Adult Psychoeducational Group Note  Date:  02/15/2021 Time:  8:31 PM  Group Topic/Focus:  Wrap-Up Group:   The focus of this group is to help patients review their daily goal of treatment and discuss progress on daily workbooks.  Participation Level:  Active  Participation Quality:  Appropriate  Affect:  Appropriate  Cognitive:  Appropriate  Insight: Limited  Engagement in Group:  Limited  Modes of Intervention:  Discussion  Additional Comments:  Pt stated his goal for today was to focus on his treatment plan. Pt stated he accomplished his goal today. Pt stated he talked with his doctor and social worker about his care today. Pt rated his overall day a 10. Pt stated he made no calls today. Pt stated he felt better about himself today. Pt stated he was able to attend all meals. Pt stated he took all medications provided today. Pt stated he attend all groups held today. Pt stated his appetite was pretty good today. Pt rated sleep last night was pretty good. Pt stated the goal tonight was to get some rest. Pt stated he had no physical pain tonight. Pt deny visual hallucinations and auditory issues tonight. Pt denies thoughts of harming himself or others. Pt stated he would alert staff if anything changed.  [name] 02/15/2021, 8:31 PM

## 2021-02-15 NOTE — Progress Notes (Signed)
Patient was up in the dayroom after showering. He attended group, watched tv and ate his snack. He showed no signs of responding to internal stimuli. He was compliant with his medications and received 3 units of novolog for his 279 CBG.

## 2021-02-15 NOTE — Progress Notes (Addendum)
[facility] MD Progress Note  02/15/2021 7:37 AM Mitchell [name]  MRN:  [id number] Subjective:   Mitchell [name] is a 60 year old male with a past history of schizophrenia presenting under IVC for acute psychosis. He was exhibiting bizarre behavior (e.g. running through the streets naked).   Yesterday the following changes were made to the patient's regimen: -Start Clozapine 25 mg in the morning 25 mg at night  The patient's chart was reviewed and nursing notes were reviewed. Over the past 24 hrs, the patient received no PRN medications for agitation. Per staff the patient was talking with unseen others. The patient's case was discussed in multidisciplinary team meeting.   TODAY'S INTERVIEW Patient was seen and staffed with Dr. Caswell Corwin.  Patient is interviewed in his hospital room.  He continues to appear somnolent.  His thought process is less tangential. Patient denies any side effects from medication changes.  Patient reports that he has been sleeping well, eating well, and has no complaints regarding present hospitalization.  Patient continues to claim that the year is "2 thousand 2020" which appears to be how patient references 2022.  When asked what a quarter, nickel, dime make patients that over a minute thinking about it and when told it was $0.40 patient stated "I was just about to get to it.  I knew that". Patient is alert and oriented to self, location, and month/year.  When asked who the president was, patient reports that it was Mitchell [name] and that he was Mitchell [name].  Patient did appear mildly confused well after he stated his own name and then stated he was still Mitchell [name].  Patient was pleasant and cooperative throughout assessment.  He denies having suicidal thoughts.  He denies having homicidal thoughts.  Patient does report that his only goal now is to "get out of here".  ROS for clozapine was reassessed today: Malaise: denies Chest pain: denies Shortness of breath: denies Exertional  capacity: denies Tachycardia: denies Cough: denies Fever: denies Sedation: denies Orthostatic hypotension (dizziness with standing): denies Hypersalivation: denies Constipation: denies Symptoms of GERD: denies Nausea: denies Nocturnal enuresis: denies  Discussed plan today to continue changing medications including increasing clozapine. Patient verbalized agreement.  Principal Problem: Schizophrenia ([place]) Diagnosis: Principal Problem:   Schizophrenia ([place])   Past Psychiatric History: schizophrenia/schizoaffective disorder  Past Medical History:  Past Medical History:  Diagnosis Date   Bipolar affective disorder ([place])    takes Zyprexa daily   Diabetes mellitus    takes Victoza,Metformin,and Glipizide daily   Hypertension    takes Amlodipine,Lisinopril and Clonidine daily   Hyponatremia    history of   Mental disorder    takes Lithium daily   Schizoaffective disorder    takes Trazodone nightly   Seasonal allergies    takes Claritin daily   Sleep apnea    sleep study >54yr ago   Stroke ([facility]    left arm weakness   Past Surgical History:  Procedure Laterality Date   CATARACT EXTRACTION W/PHACO Right 02/14/2013   Procedure: CATARACT EXTRACTION PHACO AND INTRAOCULAR LENS PLACEMENT [place]  Surgeon: GAdonis Brook MD;  [place]  Service: Ophthalmology;  Laterality: Right;   CATARACT EXTRACTION W/PHACO Left 06/13/2013   Procedure: CATARACT EXTRACTION PHACO AND INTRAOCULAR LENS PLACEMENT ([facility]);  Surgeon: GAdonis Brook MD;  [place]  Service: Ophthalmology;  Laterality: Left;   CIRCUMCISION  20 yrs. ago   EYE SURGERY     Family History: History reviewed. No pertinent family history. Family Psychiatric  History:  reports an Hx of BPAD Social History:  Social History   Substance and Sexual Activity  Alcohol Use Not Currently     Social History   Substance and Sexual Activity  Drug Use No    Social History   Socioeconomic History   Marital status:  Divorced    Spouse name: Not on file   Number of children: Not on file   Years of education: Not on file   Highest education level: Not on file  Occupational History   Not on file  Tobacco Use   Smoking status: Never   Smokeless tobacco: Never  Vaping Use   Vaping Use: Never used  Substance and Sexual Activity   Alcohol use: Not Currently   Drug use: No   Sexual activity: Yes    Birth control/protection: None  Other Topics Concern   Not on file  Social History Narrative   Not on file   Social Determinants of Health   Financial Resource Strain: Not on file  Food Insecurity: Not on file  Transportation Needs: Not on file  Physical Activity: Not on file  Stress: Not on file  Social Connections: Not on file   Additional Social History:     Sleep: Fair  Appetite:  Fair  Current Medications: Current Facility-Administered Medications  Medication Dose Route Frequency Provider Last Rate Last Admin   acetaminophen (TYLENOL) tablet 650 mg  650 mg Oral Q6H PRN Chalmers Guest, NP       alum & mag hydroxide-simeth (MAALOX/MYLANTA) 200-200-20 MG/5ML suspension 30 mL  30 mL Oral Q4H PRN Chalmers Guest, NP       amLODipine (NORVASC) tablet 10 mg  10 mg Oral Daily Chalmers Guest, NP   10 mg at 02/14/21 1000   aspirin EC tablet 81 mg  81 mg Oral Daily Chalmers Guest, NP   81 mg at 02/14/21 0956   atenolol (TENORMIN) tablet 12.5 mg  12.5 mg Oral Daily France Ravens, MD   12.5 mg at 02/14/21 0956   benztropine (COGENTIN) tablet 1 mg  1 mg Oral BID Chalmers Guest, NP   1 mg at 02/14/21 1623   cloNIDine (CATAPRES) tablet 0.2 mg  0.2 mg Oral BID Massengill, Ovid Curd, MD   0.2 mg at 02/14/21 1623   cloZAPine (CLOZARIL) tablet 37.5 mg  37.5 mg Oral BID France Ravens, MD   37.5 mg at 02/14/21 1620   Followed by   Derrill Memo ON 02/16/2021] cloZAPine (CLOZARIL) tablet 50 mg  50 mg Oral BID France Ravens, MD       glipiZIDE (GLUCOTROL XL) 24 hr tablet 10 mg  10 mg Oral Q breakfast Chalmers Guest, NP   10 mg at  02/14/21 0956   haloperidol (HALDOL) tablet 15 mg  15 mg Oral BID Massengill, Ovid Curd, MD   15 mg at 02/14/21 1621   insulin aspart (novoLOG) injection 0-15 Units  0-15 Units Subcutaneous TID WC Noelle Sease, Jackie Plum, MD   3 Units at 02/15/21 8891   insulin aspart (novoLOG) injection 0-5 Units  0-5 Units Subcutaneous QHS Chalmers Guest, NP   5 Units at 02/13/21 2038   insulin aspart (novoLOG) injection 20 Units  20 Units Subcutaneous TID WC France Ravens, MD   20 Units at 02/15/21 0623   insulin glargine-yfgn (SEMGLEE) injection 50 Units  50 Units Subcutaneous Daily Massengill, Ovid Curd, MD   50 Units at 02/14/21 1006   lisinopril (ZESTRIL) tablet 40 mg  40 mg Oral Daily Dolby,  Craige Cotta, NP   40 mg at 02/14/21 0956   lithium carbonate (ESKALITH) CR tablet 450 mg  450 mg Oral Q12H Chalmers Guest, NP   450 mg at 02/14/21 2052   magnesium hydroxide (MILK OF MAGNESIA) suspension 30 mL  30 mL Oral Daily PRN Chalmers Guest, NP       OLANZapine zydis (ZYPREXA) disintegrating tablet 10 mg  10 mg Oral Q8H PRN Chalmers Guest, NP   10 mg at 02/03/21 2342   And   ziprasidone (GEODON) injection 20 mg  20 mg Intramuscular PRN Chalmers Guest, NP       polyethylene glycol (MIRALAX / GLYCOLAX) packet 17 g  17 g Oral Daily PRN Ethelene Hal, NP       senna-docusate (Senokot-S) tablet 1 tablet  1 tablet Oral Daily Corky Sox, MD   1 tablet at 02/14/21 1013   traZODone (DESYREL) tablet 50 mg  50 mg Oral QHS PRN Chalmers Guest, NP   50 mg at 02/14/21 2052    Lab Results:  Results for orders placed or performed during the hospital encounter of 01/28/21 (from the past 48 hour(s))  Glucose, capillary     Status: Abnormal   Collection Time: 02/13/21 12:03 PM  Result Value Ref Range   Glucose-Capillary 288 (H) 70 - 99 mg/dL    Comment: Glucose reference range applies only to samples taken after fasting for at least 8 hours.  Glucose, capillary     Status: Abnormal   Collection Time: 02/13/21  5:30 PM  Result  Value Ref Range   Glucose-Capillary 361 (H) 70 - 99 mg/dL    Comment: Glucose reference range applies only to samples taken after fasting for at least 8 hours.  Glucose, capillary     Status: Abnormal   Collection Time: 02/13/21  7:42 PM  Result Value Ref Range   Glucose-Capillary 371 (H) 70 - 99 mg/dL    Comment: Glucose reference range applies only to samples taken after fasting for at least 8 hours.  Glucose, capillary     Status: Abnormal   Collection Time: 02/14/21  5:47 AM  Result Value Ref Range   Glucose-Capillary 300 (H) 70 - 99 mg/dL    Comment: Glucose reference range applies only to samples taken after fasting for at least 8 hours.  Glucose, capillary     Status: Abnormal   Collection Time: 02/14/21 12:06 PM  Result Value Ref Range   Glucose-Capillary 192 (H) 70 - 99 mg/dL    Comment: Glucose reference range applies only to samples taken after fasting for at least 8 hours.  Glucose, capillary     Status: Abnormal   Collection Time: 02/14/21  5:15 PM  Result Value Ref Range   Glucose-Capillary 150 (H) 70 - 99 mg/dL    Comment: Glucose reference range applies only to samples taken after fasting for at least 8 hours.  Glucose, capillary     Status: Abnormal   Collection Time: 02/14/21  8:05 PM  Result Value Ref Range   Glucose-Capillary 198 (H) 70 - 99 mg/dL    Comment: Glucose reference range applies only to samples taken after fasting for at least 8 hours.  Glucose, capillary     Status: Abnormal   Collection Time: 02/15/21  5:30 AM  Result Value Ref Range   Glucose-Capillary 188 (H) 70 - 99 mg/dL    Comment: Glucose reference range applies only to samples taken after fasting for at least 8 hours.  Blood Alcohol level:  Lab Results  Component Value Date   ETH <10 01/26/2021   ETH <10 97/98/9211    Metabolic Disorder Labs: Lab Results  Component Value Date   HGBA1C 11.1 (H) 01/26/2021   MPG 271.87 01/26/2021   MPG 202.99 01/07/2019   No results found for:  PROLACTIN Lab Results  Component Value Date   CHOL 140 01/30/2021   TRIG 95 01/30/2021   HDL 49 01/30/2021   CHOLHDL 2.9 01/30/2021   VLDL 19 01/30/2021   LDLCALC 72 01/30/2021   LDLCALC 67 05/20/2020    Physical Findings: AIMS: 0, no cogwheeling, orofacial movements, tremors were noted on exam  Musculoskeletal: Strength & Muscle Tone: within normal limits Gait & Station: normal Patient leans: N/A  Psychiatric Specialty Exam:  Presentation  General Appearance: appropriate for environment Eye Contact:Fair  Speech: mumbling and rambling quality  Speech Volume:Normal  Handedness:Right   Mood and Affect  Mood: "good"  Affect: Appropriate  Thought Process  Thought Processes: Disorganized  Descriptions of Associations: Less Tangential  Orientation: disoriented to year, location  Thought Content: frank delusions of being Mitchell [name] the president  History of Schizophrenia/Schizoaffective disorder:Yes  Duration of Psychotic Symptoms:Greater than six months  Hallucinations: denies  Ideas of Reference: none reported  Suicidal Thoughts: none reported Homicidal Thoughts: none reported  Sensorium  Memory:Immediate Poor; Recent Poor  Judgment:Poor  Insight:Poor   Executive Functions  Concentration:Fair  Attention Span:Fair  [place]  Language:Good   Psychomotor Activity  Psychomotor Activity: normal   Assets  Assets:Communication Skills; Resilience   Sleep  Sleep: fair   Physical Exam Vitals reviewed.  Constitutional:      Appearance: He is not ill-appearing or toxic-appearing.  Pulmonary:     Effort: Pulmonary effort is normal.  Neurological:     General: No focal deficit present.     Mental Status: He is alert.   Blood pressure 139/79, pulse 91, temperature 97.7 F (36.5 C), temperature source Oral, resp. rate 18, height 5' 3.5" (1.613 m), weight 91.6 kg, SpO2 98 %. Body mass index is 35.22  kg/m.  Assessment: Schizophrenia HTN T2DM, with insulin dependence Tachycardia    Schizophrenia by hx (r/o schizoaffective d/o bipolar type) -Continue Haldol 15 mg BID for psychosis  -Increase Clozapine 37.5 mg am/37.5 mg pm (today 02-15-2021). Will continue increasing dosage by a total of 12.5 mg, per day, as tolerated.  -Continue Senokot-S daily -Continue Cogentin 1 mg BID for EPS prophylaxis -Continue Lithium 450 mg BID for mood stability -Start Atenolol 12.5 mg once daily - for tachycardia. Pt is asymptomatic - will monitor BP, HR, orthostatic hypotension, and for dizziness. This is recommended by [facility] prescribers guide for tachycardia, 2/2 clozapine.   Clozapine work up (labs next due 11/22) Monitoring for myocarditis, neutropenia, and hypomotility syndrome   -ANC of 3.2  -ESR of 7 CRP of 1.4  -Echo WNL  -EKG: NSR Qtc 446  -Troponin of 4  -Inquire about bowel movements   Agitation med protocol ordered: -Zydis 10 mg q8hr prn for agitation  -Ativan 1 mg once for anxiety and severe agitation -Geodon 20 mg IM once for agitation   Continue PRN's: Tylenol, Maalox, Atarax, Milk of Magnesia, Trazodone   Medical Management Covid negative CMP: Cr of 1.45 CBC: unremarkable EtOH: <10 UDS: negative TSH: not collected A1C: 11.1 Lipids: WNL  ABD distention, transaminitis According staff this has increased since admission. KUB shows non-obstructive gas pattern with no obvious stool burden. ABD exam benign except  for protuberant abdomen. ALT persistently elevated to 62. Concern was for ascites despite negative fluid wave. RUQ [place] negative for hepatic parenchymal disease or biliary disease. For ascites causes, ruled out parenchymal disease of the liver, CHF (BNP WNL), and patient likely does not have cancer. Ammonia WNL. Lipase slightly elevated.  -Hepatitis panel negative  T2DM with insulin dependence Patient CBG still tends to be elevated into 200s to 400s. Unit restriction in  evening as patient tends to gorge self during dinner. This is an attempt to better manage blood glucose as patient will also be uptitrating on clozaril. -Continue glargine 50U -Continue moderate SSI -Continue QHS correction -Continue 15U pranidal coverage  -Continue home glipizide   HTN Continue home regimen -Lisinopril 40 mg daily -Amlodipine 10 mg daily -Clonidine 0.2 mg BID   Elevated Cr (resolved) Cr of 1.45 on a baseline of  1-1.2 at admission, likely pre-renal in the context of decreased PO. Cr down to 1.29 then normalized. Creatinine 1.16 on 11/12   France Ravens, MD 02/15/2021, 7:37 AM

## 2021-02-16 LAB — CBC WITH DIFFERENTIAL/PLATELET
Abs Immature Granulocytes: 0.04 10*3/uL (ref 0.00–0.07)
Basophils Absolute: 0 10*3/uL (ref 0.0–0.1)
Basophils Relative: 1 %
Eosinophils Absolute: 0.4 10*3/uL (ref 0.0–0.5)
Eosinophils Relative: 6 %
HCT: 42.2 % (ref 39.0–52.0)
Hemoglobin: 13.8 g/dL (ref 13.0–17.0)
Immature Granulocytes: 1 %
Lymphocytes Relative: 38 %
Lymphs Abs: 2.8 10*3/uL (ref 0.7–4.0)
MCH: 31.2 pg (ref 26.0–34.0)
MCHC: 32.7 g/dL (ref 30.0–36.0)
MCV: 95.5 fL (ref 80.0–100.0)
Monocytes Absolute: 0.6 10*3/uL (ref 0.1–1.0)
Monocytes Relative: 8 %
Neutro Abs: 3.6 10*3/uL (ref 1.7–7.7)
Neutrophils Relative %: 46 %
Platelets: 256 10*3/uL (ref 150–400)
RBC: 4.42 MIL/uL (ref 4.22–5.81)
RDW: 12.8 % (ref 11.5–15.5)
WBC: 7.4 10*3/uL (ref 4.0–10.5)
nRBC: 0 % (ref 0.0–0.2)

## 2021-02-16 LAB — C-REACTIVE PROTEIN: CRP: 1.8 mg/dL — ABNORMAL HIGH (ref <1.0)

## 2021-02-16 LAB — GLUCOSE, CAPILLARY
Glucose-Capillary: 183 mg/dL — ABNORMAL HIGH (ref 70–99)
Glucose-Capillary: 204 mg/dL — ABNORMAL HIGH (ref 70–99)
Glucose-Capillary: 201 mg/dL — ABNORMAL HIGH (ref 70–99)
Glucose-Capillary: 214 mg/dL — ABNORMAL HIGH (ref 70–99)

## 2021-02-16 LAB — TROPONIN I (HIGH SENSITIVITY): Troponin I (High Sensitivity): 3 ng/L (ref <18)

## 2021-02-16 MED ORDER — HALOPERIDOL 5 MG PO TABS
12.5000 mg | ORAL_TABLET | Freq: Two times a day (BID) | ORAL | Status: DC
  Administered 2021-02-16 – 2021-02-17 (×2): 12.5 mg via ORAL
  Filled 2021-02-16 (×6): qty 2.5

## 2021-02-16 MED ORDER — CLOZAPINE 25 MG PO TABS
50.0000 mg | ORAL_TABLET | Freq: Two times a day (BID) | ORAL | Status: DC
  Administered 2021-02-16 – 2021-03-02 (×28): 50 mg via ORAL
  Filled 2021-02-16 (×7): qty 2
  Filled 2021-02-16: qty 28
  Filled 2021-02-16 (×2): qty 2
  Filled 2021-02-16: qty 28
  Filled 2021-02-16 (×4): qty 2
  Filled 2021-02-16: qty 28
  Filled 2021-02-16 (×11): qty 2
  Filled 2021-02-16: qty 28
  Filled 2021-02-16 (×6): qty 2

## 2021-02-16 NOTE — [facility] Group Notes (Signed)
[facility] Group Notes:  (Nursing/MHT/Case Management/Adjunct)  Date:  02/16/2021  Time:  6:18 PM  Type of Therapy:   Therapeutic Relaxation group  Participation Level:  Minimal  Participation Quality:  Appropriate  Affect:  Appropriate  Cognitive:  Appropriate  Insight:  Improving  Engagement in Group:  Engaged and Improving  Modes of Intervention:  Activity, Exploration, and Support  Summary of Progress/Problems: Exercise for group was to draw a picture that brings out positive emotions then to close your eyes and picture that moment that was drawn in real life along with relaxing and soothing music that reduces stress and anxiety in the body.  Monnica Saltsman J Michaelia Beilfuss 02/16/2021, 6:18 PM

## 2021-02-16 NOTE — Progress Notes (Signed)
DAR NOTE: Patient presents with a calm affect and pleasant mood.  Denies suicidal thoughts, auditory and visual hallucinations.  Rates depression at 0, hopelessness at 0, and anxiety at 0.  Maintained on routine safety checks.  Medications given as prescribed.  Support and encouragement offered as needed.    Patient observed socializing with peers in the dayroom.  Patient is safe on and off the unit.  Offered no complaint.

## 2021-02-16 NOTE — Progress Notes (Signed)
Adult Psychoeducational Group Note  Date:  02/16/2021 Time:  11:43 PM  Group Topic/Focus:  Wrap-Up Group:   The focus of this group is to help patients review their daily goal of treatment and discuss progress on daily workbooks.  Participation Level:  Minimal  Participation Quality:  Appropriate  Affect:  Appropriate  Cognitive:  Appropriate  Insight: Appropriate  Engagement in Group:  Improving  Modes of Intervention:  Discussion  Additional Comments:  Pt stated his goal for today was to focus on his treatment plan. Pt stated he accomplished his goal today. Pt stated he talked with his doctor and social worker about his care today. Pt rated his overall day a 10. Pt stated he made no calls today. Pt stated he felt better about himself today. Pt stated he was able to attend all meals. Pt stated he took all medications provided today. Pt stated he attend all groups held today. Pt stated his appetite was pretty good today. Pt rated sleep last night was pretty good. Pt stated the goal tonight was to get some rest. Pt stated he had no physical pain tonight. Pt deny visual hallucinations and auditory issues tonight. Pt denies thoughts of harming himself or others. Pt stated he would alert staff if anything changed.  [name] 02/16/2021, 11:43 PM

## 2021-02-16 NOTE — Progress Notes (Addendum)
[facility] MD Progress Note  02/16/2021 2:19 PM Mitchell [name]  MRN:  [id number] Subjective:   Mitchell [name] is a 60 year old male with a past history of schizophrenia presenting under IVC for acute psychosis. He was exhibiting bizarre behavior (e.g. running through the streets naked).   The patient's chart was reviewed and nursing notes were reviewed. Over the past 24 hrs, the patient received no PRN medications for agitation. Per staff the patient was talking with unseen others. The patient's case was discussed in multidisciplinary team meeting.   TODAY'S INTERVIEW Patient was seen and staffed with Dr. Caswell Corwin.  Patient is interviewed in his hospital room.  Patient reports that he is still doing "fine".  Patient reports that he is eating well, sleeping well, having regular bowel movements, and denies gastrointestinal/urinary symptoms.  Patient is alert and oriented to name, location, time.  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  Patient stated "You know that famous person? 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  Believe me. Check me out."   ROS for clozapine was reassessed today: Malaise: denies Chest pain: denies Shortness of breath: denies Exertional capacity: denies Tachycardia: denies Cough: denies Fever: denies Sedation: denies Orthostatic hypotension (dizziness with standing): denies Hypersalivation: denies Constipation: denies Symptoms of GERD: denies Nausea: denies Nocturnal enuresis: denies  Discussed plan today to continue changing medications including increasing clozapine. Patient verbalized agreement.  Principal Problem: Schizophrenia ([place]) Diagnosis: Principal Problem:   Schizophrenia ([place])   Past Psychiatric History: schizophrenia/schizoaffective disorder  Past Medical History:  Past Medical History:  Diagnosis Date   Bipolar affective disorder ([place])    takes Zyprexa daily   Diabetes mellitus    takes  Victoza,Metformin,and Glipizide daily   Hypertension    takes Amlodipine,Lisinopril and Clonidine daily   Hyponatremia    history of   Mental disorder    takes Lithium daily   Schizoaffective disorder    takes Trazodone nightly   Seasonal allergies    takes Claritin daily   Sleep apnea    sleep study >6yrs ago   Stroke [facility])    left arm weakness   Past Surgical History:  Procedure Laterality Date   CATARACT EXTRACTION W/PHACO Right 02/14/2013   Procedure: CATARACT EXTRACTION PHACO AND INTRAOCULAR LENS PLACEMENT ([place]);  Surgeon: Adonis Brook, MD;  Location: [place];  Service: Ophthalmology;  Laterality: Right;   CATARACT EXTRACTION W/PHACO Left 06/13/2013   Procedure: CATARACT EXTRACTION PHACO AND INTRAOCULAR LENS PLACEMENT ([facility]);  Surgeon: Adonis Brook, MD;  Location: [place];  Service: Ophthalmology;  Laterality: Left;   CIRCUMCISION  20 yrs. ago   EYE SURGERY     Family History: History reviewed. No pertinent family history. Family Psychiatric  History: reports an Hx of BPAD Social History:  Social History   Substance and Sexual Activity  Alcohol Use Not Currently     Social History   Substance and Sexual Activity  Drug Use No    Social History   Socioeconomic History   Marital status: Divorced    Spouse name: Not on file   Number of children: Not on file   Years of education: Not on file   Highest education level: Not on file  Occupational History   Not on file  Tobacco Use   Smoking status: Never   Smokeless tobacco: Never  Vaping Use   Vaping Use: Never used  Substance and Sexual Activity   Alcohol use: Not Currently  Drug use: No   Sexual activity: Yes    Birth control/protection: None  Other Topics Concern   Not on file  Social History Narrative   Not on file   Social Determinants of Health   Financial Resource Strain: Not on file  Food Insecurity: Not on file  Transportation Needs: Not on file  Physical Activity: Not on file  Stress: Not on  file  Social Connections: Not on file   Additional Social History:     Sleep: Fair  Appetite:  Fair  Current Medications: Current Facility-Administered Medications  Medication Dose Route Frequency Provider Last Rate Last Admin   acetaminophen (TYLENOL) tablet 650 mg  650 mg Oral Q6H PRN Novella Olive, NP       alum & mag hydroxide-simeth (MAALOX/MYLANTA) 200-200-20 MG/5ML suspension 30 mL  30 mL Oral Q4H PRN Novella Olive, NP       amLODipine (NORVASC) tablet 10 mg  10 mg Oral Daily Novella Olive, NP   10 mg at 02/16/21 0758   aspirin EC tablet 81 mg  81 mg Oral Daily Novella Olive, NP   81 mg at 02/16/21 0759   atenolol (TENORMIN) tablet 12.5 mg  12.5 mg Oral Daily Park Pope, MD   12.5 mg at 02/16/21 0757   benztropine (COGENTIN) tablet 1 mg  1 mg Oral BID Novella Olive, NP   1 mg at 02/16/21 0757   cloNIDine (CATAPRES) tablet 0.2 mg  0.2 mg Oral BID Massengill, Harrold Donath, MD   0.2 mg at 02/16/21 0759   cloZAPine (CLOZARIL) tablet 50 mg  50 mg Oral BID Park Pope, MD       glipiZIDE (GLUCOTROL XL) 24 hr tablet 10 mg  10 mg Oral Q breakfast Novella Olive, NP   10 mg at 02/16/21 0758   haloperidol (HALDOL) tablet 12.5 mg  12.5 mg Oral BID Park Pope, MD       insulin aspart (novoLOG) injection 0-15 Units  0-15 Units Subcutaneous TID WC Hill, Shelbie Hutching, MD   5 Units at 02/16/21 1207   insulin aspart (novoLOG) injection 0-5 Units  0-5 Units Subcutaneous QHS Novella Olive, NP   3 Units at 02/15/21 2008   insulin aspart (novoLOG) injection 20 Units  20 Units Subcutaneous TID WC Park Pope, MD   20 Units at 02/16/21 1208   insulin glargine-yfgn (SEMGLEE) injection 50 Units  50 Units Subcutaneous Daily Massengill, Harrold Donath, MD   50 Units at 02/16/21 0756   lisinopril (ZESTRIL) tablet 40 mg  40 mg Oral Daily Novella Olive, NP   40 mg at 02/16/21 0757   lithium carbonate (ESKALITH) CR tablet 450 mg  450 mg Oral Q12H Novella Olive, NP   450 mg at 02/16/21 0758   magnesium hydroxide  (MILK OF MAGNESIA) suspension 30 mL  30 mL Oral Daily PRN Novella Olive, NP       OLANZapine zydis (ZYPREXA) disintegrating tablet 10 mg  10 mg Oral Q8H PRN Novella Olive, NP   10 mg at 02/03/21 2342   And   ziprasidone (GEODON) injection 20 mg  20 mg Intramuscular PRN Novella Olive, NP       polyethylene glycol (MIRALAX / GLYCOLAX) packet 17 g  17 g Oral Daily PRN Laveda Abbe, NP       senna-docusate (Senokot-S) tablet 1 tablet  1 tablet Oral Daily Carlyn Reichert, MD   1 tablet at 02/16/21 0758   traZODone (DESYREL)  tablet 50 mg  50 mg Oral QHS PRN Chalmers Guest, NP   50 mg at 02/14/21 2052    Lab Results:  Results for orders placed or performed during the hospital encounter of 01/28/21 (from the past 48 hour(s))  Glucose, capillary     Status: Abnormal   Collection Time: 02/14/21  5:15 PM  Result Value Ref Range   Glucose-Capillary 150 (H) 70 - 99 mg/dL    Comment: Glucose reference range applies only to samples taken after fasting for at least 8 hours.  Glucose, capillary     Status: Abnormal   Collection Time: 02/14/21  8:05 PM  Result Value Ref Range   Glucose-Capillary 198 (H) 70 - 99 mg/dL    Comment: Glucose reference range applies only to samples taken after fasting for at least 8 hours.  Resp Panel by RT-PCR (Flu A&B, Covid) Nasopharyngeal Swab     Status: None   Collection Time: 02/15/21  4:21 AM   Specimen: Nasopharyngeal Swab; Nasopharyngeal(NP) swabs in vial transport medium  Result Value Ref Range   SARS Coronavirus 2 by RT PCR NEGATIVE NEGATIVE    Comment: (NOTE) SARS-CoV-2 target nucleic acids are NOT DETECTED.  The SARS-CoV-2 RNA is generally detectable in upper respiratory specimens during the acute phase of infection. The lowest concentration of SARS-CoV-2 viral copies this assay can detect is 138 copies/mL. A negative result does not preclude SARS-Cov-2 infection and should not be used as the sole basis for treatment or other patient management  decisions. A negative result may occur with  improper specimen collection/handling, submission of specimen other than nasopharyngeal swab, presence of viral mutation(s) within the areas targeted by this assay, and inadequate number of viral copies(<138 copies/mL). A negative result must be combined with clinical observations, patient history, and epidemiological information. The expected result is Negative.  Fact Sheet for Patients:  EntrepreneurPulse.com.au  Fact Sheet for Healthcare Providers:  IncredibleEmployment.be  This test is no t yet approved or cleared by the [place] FDA and  has been authorized for detection and/or diagnosis of SARS-CoV-2 by FDA under an Emergency Use Authorization (EUA). This EUA will remain  in effect (meaning this test can be used) for the duration of the COVID-19 declaration under Section 564(b)(1) of the Act, 21 U.S.C.section 360bbb-3(b)(1), unless the authorization is terminated  or revoked sooner.       Influenza A by PCR NEGATIVE NEGATIVE   Influenza B by PCR NEGATIVE NEGATIVE    Comment: (NOTE) The Xpert Xpress SARS-CoV-2/FLU/RSV plus assay is intended as an aid in the diagnosis of influenza from Nasopharyngeal swab specimens and should not be used as a sole basis for treatment. Nasal washings and aspirates are unacceptable for Xpert Xpress SARS-CoV-2/FLU/RSV testing.  Fact Sheet for Patients: EntrepreneurPulse.com.au  Fact Sheet for Healthcare Providers: IncredibleEmployment.be  This test is not yet approved or cleared by the [place] FDA and has been authorized for detection and/or diagnosis of SARS-CoV-2 by FDA under an Emergency Use Authorization (EUA). This EUA will remain in effect (meaning this test can be used) for the duration of the COVID-19 declaration under Section 564(b)(1) of the Act, 21 U.S.C. section 360bbb-3(b)(1), unless the authorization  is terminated or revoked.  Performed at [facility], [place] [address], [place], [place] [address]   Glucose, capillary     Status: Abnormal   Collection Time: 02/15/21  5:30 AM  Result Value Ref Range   Glucose-Capillary 188 (H) 70 - 99 mg/dL  Comment: Glucose reference range applies only to samples taken after fasting for at least 8 hours.  Glucose, capillary     Status: Abnormal   Collection Time: 02/15/21 11:38 AM  Result Value Ref Range   Glucose-Capillary 157 (H) 70 - 99 mg/dL    Comment: Glucose reference range applies only to samples taken after fasting for at least 8 hours.  Glucose, capillary     Status: Abnormal   Collection Time: 02/15/21  5:33 PM  Result Value Ref Range   Glucose-Capillary 113 (H) 70 - 99 mg/dL    Comment: Glucose reference range applies only to samples taken after fasting for at least 8 hours.  Glucose, capillary     Status: Abnormal   Collection Time: 02/15/21  7:31 PM  Result Value Ref Range   Glucose-Capillary 279 (H) 70 - 99 mg/dL    Comment: Glucose reference range applies only to samples taken after fasting for at least 8 hours.  Glucose, capillary     Status: Abnormal   Collection Time: 02/16/21  5:50 AM  Result Value Ref Range   Glucose-Capillary 183 (H) 70 - 99 mg/dL    Comment: Glucose reference range applies only to samples taken after fasting for at least 8 hours.  CBC with Differential/Platelet     Status: None   Collection Time: 02/16/21  6:34 AM  Result Value Ref Range   WBC 7.4 4.0 - 10.5 K/uL   RBC 4.42 4.22 - 5.81 MIL/uL   Hemoglobin 13.8 13.0 - 17.0 g/dL   HCT 42.2 39.0 - 52.0 %   MCV 95.5 80.0 - 100.0 fL   MCH 31.2 26.0 - 34.0 pg   MCHC 32.7 30.0 - 36.0 g/dL   RDW 12.8 11.5 - 15.5 %   Platelets 256 150 - 400 K/uL   nRBC 0.0 0.0 - 0.2 %   Neutrophils Relative % 46 %   Neutro Abs 3.6 1.7 - 7.7 K/uL   Lymphocytes Relative 38 %   Lymphs Abs 2.8 0.7 - 4.0 K/uL   Monocytes Relative 8 %   Monocytes  Absolute 0.6 0.1 - 1.0 K/uL   Eosinophils Relative 6 %   Eosinophils Absolute 0.4 0.0 - 0.5 K/uL   Basophils Relative 1 %   Basophils Absolute 0.0 0.0 - 0.1 K/uL   Immature Granulocytes 1 %   Abs Immature Granulocytes 0.04 0.00 - 0.07 K/uL    Comment: Performed at [facility], [place] [address], [place], [place] [address]  C-reactive protein     Status: Abnormal   Collection Time: 02/16/21  6:34 AM  Result Value Ref Range   CRP 1.8 (H) <1.0 mg/dL    Comment: Performed at [place] [address], [place], [place] [address]  Troponin I (High Sensitivity)     Status: None   Collection Time: 02/16/21  6:34 AM  Result Value Ref Range   Troponin I (High Sensitivity) 3 <18 ng/L    Comment: (NOTE) Elevated high sensitivity troponin I (hsTnI) values and significant  changes across serial measurements may suggest ACS but many other  chronic and acute conditions are known to elevate hsTnI results.  Refer to the "Links" section for chest pain algorithms and additional  guidance. Performed at [facility], [place] [address] [place], [place] [address]   Glucose, capillary     Status: Abnormal   Collection Time: 02/16/21 12:00 PM  Result Value Ref Range   Glucose-Capillary 204 (H) 70 - 99  mg/dL    Comment: Glucose reference range applies only to samples taken after fasting for at least 8 hours.    Blood Alcohol level:  Lab Results  Component Value Date   ETH <10 01/26/2021   ETH <10 01/21/2021    Metabolic Disorder Labs: Lab Results  Component Value Date   HGBA1C 11.1 (H) 01/26/2021   MPG 271.87 01/26/2021   MPG 202.99 01/07/2019   No results found for: PROLACTIN Lab Results  Component Value Date   CHOL 140 01/30/2021   TRIG 95 01/30/2021   HDL 49 01/30/2021   CHOLHDL 2.9 01/30/2021   VLDL 19 01/30/2021   LDLCALC 72 01/30/2021   LDLCALC 67 05/20/2020    Physical Findings: AIMS: 0, no cogwheeling, orofacial movements, tremors  were noted on exam  Musculoskeletal: Strength & Muscle Tone: within normal limits Gait & Station: normal Patient leans: N/A  Psychiatric Specialty Exam:  Presentation  General Appearance: appropriate for environment Eye Contact:Fair  Speech: mumbling and rambling quality  Speech Volume:Normal  Handedness:Right   Mood and Affect  Mood: "good"  Affect: Appropriate  Thought Process  Thought Processes: Disorganized  Descriptions of Associations: Less Tangential  Orientation: alert and oriented to self, location, and time  Thought Content: frank delusions of being Duane BostonJoe Biden the president  History of Schizophrenia/Schizoaffective disorder:Yes  Duration of Psychotic Symptoms:Greater than six months  Hallucinations: denies  Ideas of Reference: none reported  Suicidal Thoughts: none reported Homicidal Thoughts: none reported  Sensorium  Memory:Immediate Poor; Recent Poor  Judgment:Poor  Insight:Poor   Executive Functions  Concentration:Fair  Attention Span:Fair  Recall:Fair  Fund of Knowledge:Fair  Language:Good   Psychomotor Activity  Psychomotor Activity: normal   Assets  Assets:Communication Skills; Resilience   Sleep  Sleep: fair   Physical Exam Vitals reviewed.  Constitutional:      Appearance: He is not ill-appearing or toxic-appearing.  Pulmonary:     Effort: Pulmonary effort is normal.  Neurological:     General: No focal deficit present.     Mental Status: He is alert.   Blood pressure 118/82, pulse 99, temperature 98.8 F (37.1 C), temperature source Oral, resp. rate 18, height 5' 3.5" (1.613 m), weight 91.6 kg, SpO2 100 %. Body mass index is 35.22 kg/m.  Assessment: Schizophrenia HTN T2DM, with insulin dependence Tachycardia    Schizophrenia by hx (r/o schizoaffective d/o bipolar type) -Continue Haldol 15 mg BID for psychosis  -Increase Clozapine 37.5 mg am/50 mg pm (today 02-16-2021). Will continue increasing dosage  by a total of 12.5 mg, per day, as tolerated.  -Continue Senokot-S daily -Continue Cogentin 1 mg BID for EPS prophylaxis -Continue Lithium 450 mg BID for mood stability -Continue Atenolol 12.5 mg once daily - for tachycardia. Pt is asymptomatic - will monitor BP, HR, orthostatic hypotension, and for dizziness. This is recommended by [facility] prescribers guide for tachycardia, 2/2 clozapine.   Clozapine work up (labs next due 11/28) Monitoring for myocarditis, neutropenia, and hypomotility syndrome   -ANC of 3.2  -CRP 1.8 (asymptomatic)  -Echo WNL  -EKG: NSR Qtc 446  -Troponin of 3  -Inquire about bowel movements   Agitation med protocol ordered: -Zydis 10 mg q8hr prn for agitation  -Ativan 1 mg once for anxiety and severe agitation -Geodon 20 mg IM once for agitation   Continue PRN's: Tylenol, Maalox, Atarax, Milk of Magnesia, Trazodone   Medical Management Covid negative CMP: Cr of 1.45 CBC: unremarkable EtOH: <10 UDS: negative TSH: not collected A1C: 11.1 Lipids: WNL  ABD distention, transaminitis According staff this has increased since admission. KUB shows non-obstructive gas pattern with no obvious stool burden. ABD exam benign except for protuberant abdomen. ALT persistently elevated to 62. Concern was for ascites despite negative fluid wave. RUQ [place] negative for hepatic parenchymal disease or biliary disease. For ascites causes, ruled out parenchymal disease of the liver, CHF (BNP WNL), and patient likely does not have cancer. Ammonia WNL. Lipase slightly elevated.  -Hepatitis panel negative  T2DM with insulin dependence Patient CBG still tends to be elevated into 200s to 400s. Unit restriction in evening as patient tends to gorge self during dinner. This is an attempt to better manage blood glucose as patient will also be uptitrating on clozaril. -Continue glargine 50U -Continue moderate SSI -Continue QHS correction -Continue 15U pranidal coverage  -Continue home  glipizide   HTN Continue home regimen -Lisinopril 40 mg daily -Amlodipine 10 mg daily -Clonidine 0.2 mg BID   Elevated Cr (resolved) Cr of 1.45 on a baseline of  1-1.2 at admission, likely pre-renal in the context of decreased PO. Cr down to 1.29 then normalized. Creatinine 1.16 on 11/12   France Ravens, MD 02/16/2021, 2:19 PM

## 2021-02-16 NOTE — Group Note (Signed)
LCSW Group Therapy Note     Type of Therapy and Topic:  Group Therapy: Worry    Participation Level: Did Not Attend   Description of Group:   In this group, patients learned how to define worry. Patients were asked to identify a time they felt anxiety or something that triggers their anxiety. Patients engaged in a matching interactive activity matching bugs, with each match facts about worry was shared. Patients and CSW engaged in discussion surrounding each fact / definition shared. Patients were then asked to explore positive coping mechanisms for worry and discussed things like unrealistic worry, things out of our control and times that they worried about something but actually ended up enjoying it (I.e. Learning to ride a bike). Patients discussed several new ways to handle worry such as music, journaling, thinking about positive endings instead of negative, drawing, happy place, relaxation skills (deep breathing, progressive muscle relaxation and meditation) and engaging in a hobby. CSW asked patients to commit to trying a positive coping skill when faced with worry in the future.   Therapeutic Goals: Patients will recall a time they felt worried or identify something they worry about often. Patients will learn how to define worry.  Patients will learn that some worry cannot be erased, as some things are out of out control and at times our worries are not likely to happen.  Patients will be asked to do some self-reflection with their own worry (throughout the matching activity). Patients will be asked to practice impulse control with the matching game and imagery with the happy place during this group.   Patients were asked to share ways they can cope with anxiety.       Summary of Patient Progress:  Did not attend     Therapeutic Modalities:   Cognitive Behavioral Therapy Motivational Interviewing  Brief Therapy

## 2021-02-16 NOTE — Progress Notes (Signed)
Pt continues to be delusional and disorganized at times. Pt pleasant on the unit this evening    02/16/21 2200  Psych Admission Type (Psych Patients Only)  Admission Status Involuntary  Psychosocial Assessment  Patient Complaints None  Eye Contact Fair  Facial Expression Flat  Affect Appropriate to circumstance  Speech Logical/coherent  Interaction Assertive  Motor Activity Slow  Appearance/Hygiene In hospital gown  Behavior Characteristics Cooperative  Mood Pleasant  Aggressive Behavior  Effect No apparent injury  Thought Process  Coherency Tangential  Content Delusions  Delusions None reported or observed  Perception Derealization;Hallucinations  Hallucination None reported or observed  Judgment Limited  Confusion None  Danger to Self  Current suicidal ideation? Denies  Danger to Others  Danger to Others None reported or observed

## 2021-02-17 LAB — GLUCOSE, CAPILLARY
Glucose-Capillary: 240 mg/dL — ABNORMAL HIGH (ref 70–99)
Glucose-Capillary: 254 mg/dL — ABNORMAL HIGH (ref 70–99)
Glucose-Capillary: 220 mg/dL — ABNORMAL HIGH (ref 70–99)
Glucose-Capillary: 291 mg/dL — ABNORMAL HIGH (ref 70–99)

## 2021-02-17 MED ORDER — INSULIN GLARGINE-YFGN 100 UNIT/ML ~~LOC~~ SOLN
55.0000 [IU] | Freq: Every day | SUBCUTANEOUS | Status: DC
  Administered 2021-02-18 – 2021-02-24 (×7): 55 [IU] via SUBCUTANEOUS

## 2021-02-17 MED ORDER — HALOPERIDOL 5 MG PO TABS
10.0000 mg | ORAL_TABLET | Freq: Two times a day (BID) | ORAL | Status: DC
  Administered 2021-02-17 – 2021-03-02 (×26): 10 mg via ORAL
  Filled 2021-02-17 (×30): qty 2

## 2021-02-17 MED ORDER — INSULIN ASPART 100 UNIT/ML IJ SOLN
23.0000 [IU] | Freq: Three times a day (TID) | INTRAMUSCULAR | Status: DC
  Administered 2021-02-17 – 2021-02-18 (×3): 23 [IU] via SUBCUTANEOUS

## 2021-02-17 NOTE — Progress Notes (Signed)
Pt visible on the unit this evening, pt continues to respond to internal stimuli, but pt has been pleasant on the unit this evening    02/17/21 2200  Psych Admission Type (Psych Patients Only)  Admission Status Involuntary  Psychosocial Assessment  Patient Complaints None  Eye Contact Fair  Facial Expression Flat  Affect Appropriate to circumstance  Speech Logical/coherent  Interaction Assertive  Motor Activity Slow  Appearance/Hygiene In hospital gown  Behavior Characteristics Cooperative  Mood Pleasant  Aggressive Behavior  Effect No apparent injury  Thought Process  Coherency Tangential  Content Delusions  Delusions None reported or observed  Perception Derealization;Hallucinations  Hallucination None reported or observed  Judgment Limited  Confusion None  Danger to Self  Current suicidal ideation? Denies  Danger to Others  Danger to Others None reported or observed

## 2021-02-17 NOTE — [facility] Group Notes (Signed)
[facility] Group Notes:  (Nursing/MHT/Case Management/Adjunct)  Date:  02/17/2021  Time:  6:57 PM  Type of Therapy:  Psychoeducational Skills  Participation Level:  Minimal  Participation Quality:  Attentive  Affect:  Anxious  Cognitive:  Appropriate  Insight:  Appropriate  Engagement in Group:  Engaged  Modes of Intervention:  Discussion, Education, and Support  Summary of Progress/Problems: Topic of group was the five love languages and how each one is very important in their own way.  Maisie Fus J Stancil Deisher 02/17/2021, 6:57 PM

## 2021-02-17 NOTE — Progress Notes (Signed)
DAR NOTE: Patient presents with a calm affect and pleasant mood.  Denies suicidal thoughts, auditory and visual hallucinations.  Rates depression at 0, hopelessness at 0, and anxiety at 0.  Maintained on routine safety checks.  Medications given as prescribed.  Support and encouragement offered as needed.  Attended group and participated. Patient was in his room for majority of the shift.  Minimal interaction with staff and peers.  Patient is safe on and off the unit.  Offered no complaint.

## 2021-02-17 NOTE — Progress Notes (Addendum)
[facility] MD Progress Note  02/17/2021 1:55 PM Mitchell [name]  MRN:  [id number] Subjective:   Mitchell [name] is a 60 year old male with a past history of schizophrenia presenting under IVC for acute psychosis. He was exhibiting bizarre behavior (e.g. running through the streets naked).   The patient's chart was reviewed and nursing notes were reviewed. Over the past 24 hrs, the patient received no PRN medications for agitation. Per staff the patient was talking with unseen others. The patient's case was discussed in multidisciplinary team meeting.   TODAY'S INTERVIEW Patient was seen and staffed with Dr. Caswell Corwin.  Patient is interviewed in his hospital room.  Patient reports that he is still doing "fine".  Patient reports that he is eating well, sleeping well, having regular bowel movements, and denies gastrointestinal/urinary symptoms.  Patient is alert and oriented to name, location, time.  Patient reports that he is still the president of the [place] and that his name is Mitchell [name] after telling me his name was Mitchell [name] 2 minutes prior.  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  He do not believe me?  I can go around the world by myself."   ROS for clozapine was reassessed today: Malaise: denies Chest pain: denies Shortness of breath: denies Exertional capacity: denies Tachycardia: denies Cough: denies Fever: denies Sedation: denies Orthostatic hypotension (dizziness with standing): denies Hypersalivation: denies Constipation: denies Symptoms of GERD: denies Nausea: denies Nocturnal enuresis: denies  Discussed plan today to continue changing medications including continuing clozapine and decreasing haldol. Patient verbalized agreement.  Principal Problem: Schizophrenia ([place]) Diagnosis: Principal Problem:   Schizophrenia ([place]) Active Problems:   Type 2 diabetes mellitus ([place])   Past Psychiatric History: schizophrenia/schizoaffective disorder  Past Medical History:   Past Medical History:  Diagnosis Date   Bipolar affective disorder ([place])    takes Zyprexa daily   Diabetes mellitus    takes Victoza,Metformin,and Glipizide daily   Hypertension    takes Amlodipine,Lisinopril and Clonidine daily   Hyponatremia    history of   Mental disorder    takes Lithium daily   Schizoaffective disorder    takes Trazodone nightly   Seasonal allergies    takes Claritin daily   Sleep apnea    sleep study >66yrs ago   Stroke [facility])    left arm weakness   Past Surgical History:  Procedure Laterality Date   CATARACT EXTRACTION W/PHACO Right 02/14/2013   Procedure: CATARACT EXTRACTION PHACO AND INTRAOCULAR LENS PLACEMENT ([place]);  Surgeon: Adonis Brook, MD;  Location: [place];  Service: Ophthalmology;  Laterality: Right;   CATARACT EXTRACTION W/PHACO Left 06/13/2013   Procedure: CATARACT EXTRACTION PHACO AND INTRAOCULAR LENS PLACEMENT ([facility]);  Surgeon: Adonis Brook, MD;  Location: [place];  Service: Ophthalmology;  Laterality: Left;   CIRCUMCISION  20 yrs. ago   EYE SURGERY     Family History: History reviewed. No pertinent family history. Family Psychiatric  History: reports an Hx of BPAD Social History:  Social History   Substance and Sexual Activity  Alcohol Use Not Currently     Social History   Substance and Sexual Activity  Drug Use No    Social History   Socioeconomic History   Marital status: Divorced    Spouse name: Not on file   Number of children: Not on file   Years of education: Not on file   Highest education level: Not on file  Occupational History   Not on file  Tobacco Use   Smoking status: Never  Smokeless tobacco: Never  Vaping Use   Vaping Use: Never used  Substance and Sexual Activity   Alcohol use: Not Currently   Drug use: No   Sexual activity: Yes    Birth control/protection: None  Other Topics Concern   Not on file  Social History Narrative   Not on file   Social Determinants of Health   Financial Resource Strain:  Not on file  Food Insecurity: Not on file  Transportation Needs: Not on file  Physical Activity: Not on file  Stress: Not on file  Social Connections: Not on file   Additional Social History:     Sleep: Fair  Appetite:  Fair  Current Medications: Current Facility-Administered Medications  Medication Dose Route Frequency Provider Last Rate Last Admin   acetaminophen (TYLENOL) tablet 650 mg  650 mg Oral Q6H PRN Novella Oliveolby, Karen R, NP       alum & mag hydroxide-simeth (MAALOX/MYLANTA) 200-200-20 MG/5ML suspension 30 mL  30 mL Oral Q4H PRN Novella Oliveolby, Karen R, NP       amLODipine (NORVASC) tablet 10 mg  10 mg Oral Daily Novella Oliveolby, Karen R, NP   10 mg at 02/17/21 0809   aspirin EC tablet 81 mg  81 mg Oral Daily Novella Oliveolby, Karen R, NP   81 mg at 02/17/21 0809   atenolol (TENORMIN) tablet 12.5 mg  12.5 mg Oral Daily Park PopeJi, Andrew, MD   12.5 mg at 02/17/21 0809   benztropine (COGENTIN) tablet 1 mg  1 mg Oral BID Novella Oliveolby, Karen R, NP   1 mg at 02/17/21 [id number]   cloNIDine (CATAPRES) tablet 0.2 mg  0.2 mg Oral BID Shandrell Boda, Harrold DonathNathan, MD   0.2 mg at 02/17/21 0809   cloZAPine (CLOZARIL) tablet 50 mg  50 mg Oral BID Park PopeJi, Andrew, MD   50 mg at 02/17/21 [id number]   glipiZIDE (GLUCOTROL XL) 24 hr tablet 10 mg  10 mg Oral Q breakfast Novella Oliveolby, Karen R, NP   10 mg at 02/17/21 [id number]   haloperidol (HALDOL) tablet 12.5 mg  12.5 mg Oral BID Park PopeJi, Andrew, MD   12.5 mg at 02/17/21 0809   insulin aspart (novoLOG) injection 0-15 Units  0-15 Units Subcutaneous TID WC Hill, Shelbie HutchingStephanie [place], MD   8 Units at 02/17/21 1210   insulin aspart (novoLOG) injection 0-5 Units  0-5 Units Subcutaneous QHS Novella Oliveolby, Karen R, NP   2 Units at 02/16/21 2039   insulin aspart (novoLOG) injection 23 Units  23 Units Subcutaneous TID WC Park PopeJi, Andrew, MD       [START ON 02/18/2021] insulin glargine-yfgn (SEMGLEE) injection 55 Units  55 Units Subcutaneous Daily Park PopeJi, Andrew, MD       lisinopril (ZESTRIL) tablet 40 mg  40 mg Oral Daily Novella Oliveolby, Karen R, NP   40 mg at 02/17/21 0808    lithium carbonate (ESKALITH) CR tablet 450 mg  450 mg Oral Q12H Novella Oliveolby, Karen R, NP   450 mg at 02/17/21 0809   magnesium hydroxide (MILK OF MAGNESIA) suspension 30 mL  30 mL Oral Daily PRN Novella Oliveolby, Karen R, NP       OLANZapine zydis (ZYPREXA) disintegrating tablet 10 mg  10 mg Oral Q8H PRN Novella Oliveolby, Karen R, NP   10 mg at 02/03/21 2342   And   ziprasidone (GEODON) injection 20 mg  20 mg Intramuscular PRN Novella Oliveolby, Karen R, NP       polyethylene glycol (MIRALAX / GLYCOLAX) packet 17 g  17 g Oral Daily PRN Laveda AbbeParks, Laurie Britton, NP  senna-docusate (Senokot-S) tablet 1 tablet  1 tablet Oral Daily Corky Sox, MD   1 tablet at 02/17/21 0809   traZODone (DESYREL) tablet 50 mg  50 mg Oral QHS PRN Chalmers Guest, NP   50 mg at 02/16/21 2038    Lab Results:  Results for orders placed or performed during the hospital encounter of 01/28/21 (from the past 48 hour(s))  Glucose, capillary     Status: Abnormal   Collection Time: 02/15/21  5:33 PM  Result Value Ref Range   Glucose-Capillary 113 (H) 70 - 99 mg/dL    Comment: Glucose reference range applies only to samples taken after fasting for at least 8 hours.  Glucose, capillary     Status: Abnormal   Collection Time: 02/15/21  7:31 PM  Result Value Ref Range   Glucose-Capillary 279 (H) 70 - 99 mg/dL    Comment: Glucose reference range applies only to samples taken after fasting for at least 8 hours.  Glucose, capillary     Status: Abnormal   Collection Time: 02/16/21  5:50 AM  Result Value Ref Range   Glucose-Capillary 183 (H) 70 - 99 mg/dL    Comment: Glucose reference range applies only to samples taken after fasting for at least 8 hours.  CBC with Differential/Platelet     Status: None   Collection Time: 02/16/21  6:34 AM  Result Value Ref Range   WBC 7.4 4.0 - 10.5 K/uL   RBC 4.42 4.22 - 5.81 MIL/uL   Hemoglobin 13.8 13.0 - 17.0 g/dL   HCT 42.2 39.0 - 52.0 %   MCV 95.5 80.0 - 100.0 fL   MCH 31.2 26.0 - 34.0 pg   MCHC 32.7 30.0 - 36.0  g/dL   RDW 12.8 11.5 - 15.5 %   Platelets 256 150 - 400 K/uL   nRBC 0.0 0.0 - 0.2 %   Neutrophils Relative % 46 %   Neutro Abs 3.6 1.7 - 7.7 K/uL   Lymphocytes Relative 38 %   Lymphs Abs 2.8 0.7 - 4.0 K/uL   Monocytes Relative 8 %   Monocytes Absolute 0.6 0.1 - 1.0 K/uL   Eosinophils Relative 6 %   Eosinophils Absolute 0.4 0.0 - 0.5 K/uL   Basophils Relative 1 %   Basophils Absolute 0.0 0.0 - 0.1 K/uL   Immature Granulocytes 1 %   Abs Immature Granulocytes 0.04 0.00 - 0.07 K/uL    Comment: Performed at [facility], [place] [address], [place], [place] [address]  C-reactive protein     Status: Abnormal   Collection Time: 02/16/21  6:34 AM  Result Value Ref Range   CRP 1.8 (H) <1.0 mg/dL    Comment: Performed at [facility] [address], [place], [place] [address]  Troponin I (High Sensitivity)     Status: None   Collection Time: 02/16/21  6:34 AM  Result Value Ref Range   Troponin I (High Sensitivity) 3 <18 ng/L    Comment: (NOTE) Elevated high sensitivity troponin I (hsTnI) values and significant  changes across serial measurements may suggest ACS but many other  chronic and acute conditions are known to elevate hsTnI results.  Refer to the "Links" section for chest pain algorithms and additional  guidance. Performed at [facility], [place] [address] [place], [place] [address]   Glucose, capillary     Status: Abnormal   Collection Time: 02/16/21 12:00 PM  Result Value Ref Range   Glucose-Capillary 204 (H) 70 -  99 mg/dL    Comment: Glucose reference range applies only to samples taken after fasting for at least 8 hours.  Glucose, capillary     Status: Abnormal   Collection Time: 02/16/21  5:07 PM  Result Value Ref Range   Glucose-Capillary 201 (H) 70 - 99 mg/dL    Comment: Glucose reference range applies only to samples taken after fasting for at least 8 hours.  Glucose, capillary     Status: Abnormal   Collection Time: 02/16/21   7:56 PM  Result Value Ref Range   Glucose-Capillary 214 (H) 70 - 99 mg/dL    Comment: Glucose reference range applies only to samples taken after fasting for at least 8 hours.  Glucose, capillary     Status: Abnormal   Collection Time: 02/17/21  5:43 AM  Result Value Ref Range   Glucose-Capillary 240 (H) 70 - 99 mg/dL    Comment: Glucose reference range applies only to samples taken after fasting for at least 8 hours.  Glucose, capillary     Status: Abnormal   Collection Time: 02/17/21 12:04 PM  Result Value Ref Range   Glucose-Capillary 254 (H) 70 - 99 mg/dL    Comment: Glucose reference range applies only to samples taken after fasting for at least 8 hours.    Blood Alcohol level:  Lab Results  Component Value Date   ETH <10 01/26/2021   ETH <10 01/21/2021    Metabolic Disorder Labs: Lab Results  Component Value Date   HGBA1C 11.1 (H) 01/26/2021   MPG 271.87 01/26/2021   MPG 202.99 01/07/2019   No results found for: PROLACTIN Lab Results  Component Value Date   CHOL 140 01/30/2021   TRIG 95 01/30/2021   HDL 49 01/30/2021   CHOLHDL 2.9 01/30/2021   VLDL 19 01/30/2021   LDLCALC 72 01/30/2021   LDLCALC 67 05/20/2020    Physical Findings: AIMS: 0, no cogwheeling, orofacial movements, tremors were noted on exam  Musculoskeletal: Strength & Muscle Tone: within normal limits Gait & Station: normal Patient leans: N/A  Psychiatric Specialty Exam:  Presentation  General Appearance: appropriate for environment Eye Contact:Fair  Speech: mumbling and rambling quality  Speech Volume:Normal  Handedness:Right   Mood and Affect  Mood: "good"  Affect: Appropriate  Thought Process  Thought Processes: More linear than days prior. Less tangential.   Descriptions of Associations: Less Tangential  Orientation: alert and oriented to self, location, and time  Thought Content: frank delusions of being [place] the president  History of  Schizophrenia/Schizoaffective disorder:Yes  Duration of Psychotic Symptoms:Greater than six months  Hallucinations: denies  Ideas of Reference: none reported  Suicidal Thoughts: none reported Homicidal Thoughts: none reported  Sensorium  Memory:Immediate Poor; Recent Poor  Judgment:Poor  Insight:Poor   Executive Functions  Concentration:Fair  Attention Span:Fair  Recall:Fair  Fund of Knowledge:Fair  Language:Good   Psychomotor Activity  Psychomotor Activity: normal   Assets  Assets:Communication Skills; Resilience   Sleep  Sleep: fair   Physical Exam Vitals reviewed.  Constitutional:      Appearance: He is not ill-appearing or toxic-appearing.  Pulmonary:     Effort: Pulmonary effort is normal.  Neurological:     General: No focal deficit present.     Mental Status: He is alert.   Blood pressure 120/80, pulse (!) 102, temperature 98.4 F (36.9 C), temperature source Oral, resp. rate 18, height 5' 3.5" (1.613 m), weight 91.6 kg, SpO2 98 %. Body mass index is 35.22 kg/m.  Assessment: Schizophrenia  HTN T2DM, with insulin dependence Tachycardia    Schizophrenia by hx (r/o schizoaffective d/o bipolar type) -DECREASE Haldol from 12.5 mg to 10 mg BID for psychosis  -INCREASE Clozapine 50 mg am/50 mg pm (today 02-16-2021). Will monitor for a few days with medication regiment to assess for sedation and continued psychotic symptoms.  -Continue Senokot-S daily -Continue Cogentin 1 mg BID for EPS prophylaxis -Continue Lithium 450 mg BID for mood stability -Continue Atenolol 12.5 mg once daily - for tachycardia. Pt is asymptomatic - will monitor BP, HR, orthostatic hypotension, and for dizziness. This is recommended by [facility] prescribers guide for tachycardia, 2/2 clozapine.   Clozapine work up (labs next due 11/28) Monitoring for myocarditis, neutropenia, and hypomotility syndrome   -ANC of 3.2  -CRP 1.8 (asymptomatic)  -Echo WNL  -EKG: NSR Qtc  446  -Troponin of 3  -Inquire about bowel movements   Agitation med protocol ordered: -Zydis 10 mg q8hr prn for agitation  -Ativan 1 mg once for anxiety and severe agitation -Geodon 20 mg IM once for agitation   Continue PRN's: Tylenol, Maalox, Atarax, Milk of Magnesia, Trazodone   Medical Management Covid negative CMP: Cr of 1.45 CBC: unremarkable EtOH: <10 UDS: negative TSH: not collected A1C: 11.1 Lipids: WNL  ABD distention, transaminitis According staff this has increased since admission. KUB shows non-obstructive gas pattern with no obvious stool burden. ABD exam benign except for protuberant abdomen. ALT persistently elevated to 62. Concern was for ascites despite negative fluid wave. RUQ [place] negative for hepatic parenchymal disease or biliary disease. For ascites causes, ruled out parenchymal disease of the liver, CHF (BNP WNL), and patient likely does not have cancer. Ammonia WNL. Lipase slightly elevated.  -Hepatitis panel negative  T2DM with insulin dependence Patient CBG still tends to be elevated into 200s to 400s. Unit restriction in evening as patient tends to gorge self during dinner. -INCREASE glargine from 50U to 55U -Continue moderate SSI -Continue QHS correction -INCREASE Novolog from 20U to 23 U pranidal coverage  -Continue home glipizide   HTN Continue home regimen -Lisinopril 40 mg daily -Amlodipine 10 mg daily -Clonidine 0.2 mg BID   Elevated Cr (resolved) Cr of 1.45 on a baseline of  1-1.2 at admission, likely pre-renal in the context of decreased PO. Cr down to 1.29 then normalized. Creatinine 1.16 on 11/12   France Ravens, MD 02/17/2021, 1:55 PM  Total Time Spent in Direct Patient Care:  I personally spent 30 minutes on the unit in direct patient care. The direct patient care time included face-to-face time with the patient, reviewing the patient's chart, communicating with other professionals, and coordinating care. Greater than 50% of this time  was spent in counseling or coordinating care with the patient regarding goals of hospitalization, psycho-education, and discharge planning needs.  I have independently evaluated the patient during a face-to-face assessment on 02/17/21. I reviewed the patient's chart, and I participated in key portions of the service. I discussed the case with the [employer], and I agree with the assessment and plan of care as documented in the House Officer's note, as addended by me or notated below:  I directly edited the note, as above.    Janine Limbo, MD Psychiatrist

## 2021-02-17 NOTE — Progress Notes (Signed)
Inpatient Diabetes Program Recommendations  AACE/ADA: New Consensus Statement on Inpatient Glycemic Control (2015)  Target Ranges:  Prepandial:   less than 140 mg/dL      Peak postprandial:   less than 180 mg/dL (1-2 hours)      Critically ill patients:  140 - 180 mg/dL    Latest Reference Range & Units 02/16/21 05:50 02/16/21 12:00 02/16/21 17:07 02/16/21 19:56  Glucose-Capillary 70 - 99 mg/dL 845 (H)  23 units Novolog  50 units Semglee @0756   204 (H)  25 units Novolog  201 (H)  25 units Novolog  214 (H)  2 units Novolog     Latest Reference Range & Units 02/17/21 05:43  Glucose-Capillary 70 - 99 mg/dL 02/19/21 (H)  25 units Novolog   (H): Data is abnormally high  Review of Glycemic Control  Current Orders: Semglee 50 units daily Novolog 0-15 units TID ac/hs  Novolog 20 units TID with meals Glipizide 10 mg daily    MD- Please consider:  1. Increase Semglee to 55 units Daily  2. Increase Novolog Meal Coverage to 23 units TID with meals    --Will follow patient during hospitalization--  [phone] RN, MSN, CDE Diabetes Coordinator Inpatient Glycemic Control Team Team Pager: [phone] (8a-5p)

## 2021-02-18 ENCOUNTER — Encounter (HOSPITAL_COMMUNITY): Payer: Self-pay

## 2021-02-18 LAB — GLUCOSE, CAPILLARY
Glucose-Capillary: 194 mg/dL — ABNORMAL HIGH (ref 70–99)
Glucose-Capillary: 217 mg/dL — ABNORMAL HIGH (ref 70–99)
Glucose-Capillary: 231 mg/dL — ABNORMAL HIGH (ref 70–99)
Glucose-Capillary: 154 mg/dL — ABNORMAL HIGH (ref 70–99)

## 2021-02-18 MED ORDER — INSULIN ASPART 100 UNIT/ML IJ SOLN
25.0000 [IU] | Freq: Three times a day (TID) | INTRAMUSCULAR | Status: DC
  Administered 2021-02-18 – 2021-02-23 (×14): 25 [IU] via SUBCUTANEOUS

## 2021-02-18 NOTE — Progress Notes (Signed)
Group note  Pt's participated in guided imagery group for relaxation. Pt's expressed and discussed how they may be able to use this once discharged for coping and progression with their mental health.   Pt attended group and was appropriate 

## 2021-02-18 NOTE — Progress Notes (Signed)
Adult Psychoeducational Group Note  Date:  02/18/2021 Time:  8:10 PM  Group Topic/Focus:  Wrap-Up Group:   The focus of this group is to help patients review their daily goal of treatment and discuss progress on daily workbooks.  Participation Level:  Active  Participation Quality:  Appropriate  Affect:  Appropriate  Cognitive:  Appropriate  Insight: Appropriate  Engagement in Group:  Engaged  Modes of Intervention:  Discussion  Additional Comments:   Pt stated his goal for today was to focus on his treatment plan. Pt stated he accomplished his goal today. Pt stated he talked with his doctor and social worker about his care today. Pt rated his overall day a 10. Pt stated he was able to contact his daughter today which improved his overall day. Pt stated he felt better about himself today. Pt stated he was able to attend all meals. Pt stated he took all medications provided today. Pt stated he attend all groups held today. Pt stated his appetite was pretty good today. Pt rated sleep last night was pretty good. Pt stated the goal tonight was to get some rest. Pt stated he had no physical pain tonight. Pt deny visual hallucinations and auditory issues tonight. Pt denies thoughts of harming himself or others. Pt stated he would alert staff if anything changed.  [name] 02/18/2021, 8:10 PM

## 2021-02-18 NOTE — Progress Notes (Signed)
Adult Psychoeducational Group Note  Date:  02/18/2021 Time:  1:25 AM  Group Topic/Focus:  Wrap-Up Group:   The focus of this group is to help patients review their daily goal of treatment and discuss progress on daily workbooks.  Participation Level:  Minimal  Participation Quality:  Attentive  Affect:  Anxious  Cognitive:  Appropriate  Insight: Appropriate  Engagement in Group:  Engaged  Modes of Intervention:  Discussion  Additional Comments:   Pt stated his goal for today was to focus on his treatment plan. Pt stated he accomplished his goal today. Pt stated he talked with his doctor and social worker about his care today. Pt rated his overall day a 10. Pt stated he made no calls today. Pt stated he felt better about himself today. Pt stated he was able to attend all meals. Pt stated he took all medications provided today. Pt stated he attend all groups held today. Pt stated his appetite was pretty good today. Pt rated sleep last night was pretty good. Pt stated the goal tonight was to get some rest. Pt stated he had no physical pain tonight. Pt deny visual hallucinations and auditory issues tonight. Pt denies thoughts of harming himself or others. Pt stated he would alert staff if anything changed.  Mitchell [name] 02/18/2021, 1:25 AM

## 2021-02-18 NOTE — Progress Notes (Signed)
[facility] MD Progress Note  02/18/2021 2:17 PM Mitchell [name]  MRN:  [id number] Subjective:   Mitchell [name] is a 60 year old male with a past history of schizophrenia presenting under IVC for acute psychosis. He was exhibiting bizarre behavior (e.g. running through the streets naked).   The patient's chart was reviewed and nursing notes were reviewed. Over the past 24 hrs, the patient received no PRN medications for agitation. Per staff the patient was talking with unseen others. The patient's case was discussed in multidisciplinary team meeting.  Per CSW, patient appeared to be responding less to internal stimuli and less disruptive in groups.  TODAY'S INTERVIEW Patient was seen and staffed with Dr. Caswell Corwin.  Patient is interviewed in his hospital room.  Patient reports that he is still doing "the same".  Patient reports that he is eating well, sleeping well, having regular bowel movements, and denies gastrointestinal/urinary symptoms.  Patient is alert and oriented to name, location, time.  Patient continues to report that he is the president of the [place] and that his name is Mitchell [name].  Patient today reports that he has purchased the entire world and that this Probation officer should go to the courts and see his purchase history.  Discussed with patient why he is in the hospital today: Patient reports that he is here because he had told the police officers he was going to work and they brought him to the hospital.  Patient has limited recollection about him being naked in public.  ROS for clozapine was reassessed today: Malaise: denies Chest pain: denies Shortness of breath: denies Exertional capacity: denies Tachycardia: denies Cough: denies Fever: denies Sedation: denies Orthostatic hypotension (dizziness with standing): denies Hypersalivation: denies Constipation: denies Symptoms of GERD: denies Nausea: denies Nocturnal enuresis: denies  Discussed plan today to continue changing  medications including continuing current medication regimen to assess if patient is too oversedated during the day. Patient verbalized agreement.  Principal Problem: Schizophrenia ([place]) Diagnosis: Principal Problem:   Schizophrenia ([place]) Active Problems:   Type 2 diabetes mellitus ([place])   Past Psychiatric History: schizophrenia/schizoaffective disorder  Past Medical History:  Past Medical History:  Diagnosis Date   Bipolar affective disorder ([place])    takes Zyprexa daily   Diabetes mellitus    takes Victoza,Metformin,and Glipizide daily   Hypertension    takes Amlodipine,Lisinopril and Clonidine daily   Hyponatremia    history of   Mental disorder    takes Lithium daily   Schizoaffective disorder    takes Trazodone nightly   Seasonal allergies    takes Claritin daily   Sleep apnea    sleep study >32yrs ago   Stroke [facility])    left arm weakness   Past Surgical History:  Procedure Laterality Date   CATARACT EXTRACTION W/PHACO Right 02/14/2013   Procedure: CATARACT EXTRACTION PHACO AND INTRAOCULAR LENS PLACEMENT ([place]);  Surgeon: Adonis Brook, MD;  Location: [place];  Service: Ophthalmology;  Laterality: Right;   CATARACT EXTRACTION W/PHACO Left 06/13/2013   Procedure: CATARACT EXTRACTION PHACO AND INTRAOCULAR LENS PLACEMENT (I[facility]);  Surgeon: Adonis Brook, MD;  Location: [place];  Service: Ophthalmology;  Laterality: Left;   CIRCUMCISION  20 yrs. ago   EYE SURGERY     Family History: History reviewed. No pertinent family history. Family Psychiatric  History: reports an Hx of BPAD Social History:  Social History   Substance and Sexual Activity  Alcohol Use Not Currently     Social History   Substance and Sexual Activity  Drug Use  No    Social History   Socioeconomic History   Marital status: Divorced    Spouse name: Not on file   Number of children: Not on file   Years of education: Not on file   Highest education level: Not on file  Occupational History   Not on file   Tobacco Use   Smoking status: Never   Smokeless tobacco: Never  Vaping Use   Vaping Use: Never used  Substance and Sexual Activity   Alcohol use: Not Currently   Drug use: No   Sexual activity: Yes    Birth control/protection: None  Other Topics Concern   Not on file  Social History Narrative   Not on file   Social Determinants of Health   Financial Resource Strain: Not on file  Food Insecurity: Not on file  Transportation Needs: Not on file  Physical Activity: Not on file  Stress: Not on file  Social Connections: Not on file   Additional Social History:     Sleep: Fair  Appetite:  Fair  Current Medications: Current Facility-Administered Medications  Medication Dose Route Frequency Provider Last Rate Last Admin   acetaminophen (TYLENOL) tablet 650 mg  650 mg Oral Q6H PRN Chalmers Guest, NP       alum & mag hydroxide-simeth (MAALOX/MYLANTA) 200-200-20 MG/5ML suspension 30 mL  30 mL Oral Q4H PRN Chalmers Guest, NP       amLODipine (NORVASC) tablet 10 mg  10 mg Oral Daily Chalmers Guest, NP   10 mg at 02/18/21 [id number]   aspirin EC tablet 81 mg  81 mg Oral Daily Chalmers Guest, NP   81 mg at 02/18/21 0828   atenolol (TENORMIN) tablet 12.5 mg  12.5 mg Oral Daily France Ravens, MD   12.5 mg at 02/18/21 [id number]   benztropine (COGENTIN) tablet 1 mg  1 mg Oral BID Chalmers Guest, NP   1 mg at 02/18/21 0830   cloNIDine (CATAPRES) tablet 0.2 mg  0.2 mg Oral BID Massengill, Ovid Curd, MD   0.2 mg at 02/18/21 0827   cloZAPine (CLOZARIL) tablet 50 mg  50 mg Oral BID France Ravens, MD   50 mg at 02/18/21 0827   glipiZIDE (GLUCOTROL XL) 24 hr tablet 10 mg  10 mg Oral Q breakfast Chalmers Guest, NP   10 mg at 02/18/21 0831   haloperidol (HALDOL) tablet 10 mg  10 mg Oral BID France Ravens, MD   10 mg at 02/18/21 0830   insulin aspart (novoLOG) injection 0-15 Units  0-15 Units Subcutaneous TID WC Hill, Jackie Plum, MD   5 Units at 02/18/21 1208   insulin aspart (novoLOG) injection 0-5 Units  0-5 Units  Subcutaneous QHS Chalmers Guest, NP   3 Units at 02/17/21 2101   insulin aspart (novoLOG) injection 23 Units  23 Units Subcutaneous TID WC France Ravens, MD   23 Units at 02/18/21 1211   insulin glargine-yfgn (SEMGLEE) injection 55 Units  55 Units Subcutaneous Daily France Ravens, MD   55 Units at 02/18/21 0833   lisinopril (ZESTRIL) tablet 40 mg  40 mg Oral Daily Chalmers Guest, NP   40 mg at 02/18/21 0830   lithium carbonate (ESKALITH) CR tablet 450 mg  450 mg Oral Q12H Chalmers Guest, NP   450 mg at 02/18/21 0830   magnesium hydroxide (MILK OF MAGNESIA) suspension 30 mL  30 mL Oral Daily PRN Chalmers Guest, NP       OLANZapine  zydis (ZYPREXA) disintegrating tablet 10 mg  10 mg Oral Q8H PRN Chalmers Guest, NP   10 mg at 02/03/21 2342   And   ziprasidone (GEODON) injection 20 mg  20 mg Intramuscular PRN Chalmers Guest, NP       polyethylene glycol (MIRALAX / GLYCOLAX) packet 17 g  17 g Oral Daily PRN Ethelene Hal, NP       senna-docusate (Senokot-S) tablet 1 tablet  1 tablet Oral Daily Corky Sox, MD   1 tablet at 02/18/21 0830   traZODone (DESYREL) tablet 50 mg  50 mg Oral QHS PRN Chalmers Guest, NP   50 mg at 02/17/21 2100    Lab Results:  Results for orders placed or performed during the hospital encounter of 01/28/21 (from the past 48 hour(s))  Glucose, capillary     Status: Abnormal   Collection Time: 02/16/21  5:07 PM  Result Value Ref Range   Glucose-Capillary 201 (H) 70 - 99 mg/dL    Comment: Glucose reference range applies only to samples taken after fasting for at least 8 hours.  Glucose, capillary     Status: Abnormal   Collection Time: 02/16/21  7:56 PM  Result Value Ref Range   Glucose-Capillary 214 (H) 70 - 99 mg/dL    Comment: Glucose reference range applies only to samples taken after fasting for at least 8 hours.  Glucose, capillary     Status: Abnormal   Collection Time: 02/17/21  5:43 AM  Result Value Ref Range   Glucose-Capillary 240 (H) 70 - 99 mg/dL     Comment: Glucose reference range applies only to samples taken after fasting for at least 8 hours.  Glucose, capillary     Status: Abnormal   Collection Time: 02/17/21 12:04 PM  Result Value Ref Range   Glucose-Capillary 254 (H) 70 - 99 mg/dL    Comment: Glucose reference range applies only to samples taken after fasting for at least 8 hours.  Glucose, capillary     Status: Abnormal   Collection Time: 02/17/21  5:16 PM  Result Value Ref Range   Glucose-Capillary 220 (H) 70 - 99 mg/dL    Comment: Glucose reference range applies only to samples taken after fasting for at least 8 hours.  Glucose, capillary     Status: Abnormal   Collection Time: 02/17/21  7:37 PM  Result Value Ref Range   Glucose-Capillary 291 (H) 70 - 99 mg/dL    Comment: Glucose reference range applies only to samples taken after fasting for at least 8 hours.  Glucose, capillary     Status: Abnormal   Collection Time: 02/18/21  5:45 AM  Result Value Ref Range   Glucose-Capillary 194 (H) 70 - 99 mg/dL    Comment: Glucose reference range applies only to samples taken after fasting for at least 8 hours.  Glucose, capillary     Status: Abnormal   Collection Time: 02/18/21 11:52 AM  Result Value Ref Range   Glucose-Capillary 217 (H) 70 - 99 mg/dL    Comment: Glucose reference range applies only to samples taken after fasting for at least 8 hours.    Blood Alcohol level:  Lab Results  Component Value Date   [facility] <10 01/26/2021   ETH <10 [id number]    Metabolic Disorder Labs: Lab Results  Component Value Date   HGBA1C 11.1 (H) 01/26/2021   MPG 271.87 01/26/2021   MPG 202.99 01/07/2019   No results found for: PROLACTIN Lab Results  Component  Value Date   CHOL 140 01/30/2021   TRIG 95 01/30/2021   HDL 49 01/30/2021   CHOLHDL 2.9 01/30/2021   VLDL 19 01/30/2021   LDLCALC 72 01/30/2021   LDLCALC 67 05/20/2020    Physical Findings: AIMS: 0, no cogwheeling, orofacial movements, tremors were noted on  exam  Musculoskeletal: Strength & Muscle Tone: within normal limits Gait & Station: normal Patient leans: N/A  Psychiatric Specialty Exam:  Presentation  General Appearance: appropriate for environment Eye Contact:Fair  Speech: mumbling and rambling quality  Speech Volume:Normal  Handedness:Right   Mood and Affect  Mood: "fine"  Affect: Appropriate  Thought Process  Thought Processes: More linear than days prior. Less tangential.   Descriptions of Associations: Less Tangential  Orientation: alert and oriented to self, location, and time  Thought Content: frank delusions of being Joe Biden the president and purchasing the whole world  History of Schizophrenia/Schizoaffective disorder:Yes  Duration of Psychotic Symptoms:Greater than six months  Hallucinations: denies  Ideas of Reference: none reported  Suicidal Thoughts: none reported Homicidal Thoughts: none reported  Sensorium  Memory:Immediate Poor; Recent Poor  Judgment:Poor  Insight:Poor   Executive Functions  Concentration:Fair  Attention Span:Fair  Recall:Fair  Fund of Knowledge:Fair  Language:Good   Psychomotor Activity  Psychomotor Activity: normal   Assets  Assets:Communication Skills; Resilience   Sleep  Sleep: fair   Physical Exam Vitals reviewed.  Constitutional:      Appearance: He is not ill-appearing or toxic-appearing.  Pulmonary:     Effort: Pulmonary effort is normal.  Neurological:     General: No focal deficit present.     Mental Status: He is alert.   Blood pressure 133/82, pulse 98, temperature 97.7 F (36.5 C), temperature source Oral, resp. rate 18, height 5' 3.5" (1.613 m), weight 91.6 kg, SpO2 98 %. Body mass index is 35.22 kg/m.  Assessment: Schizophrenia HTN T2DM, with insulin dependence Tachycardia    Schizophrenia by hx (r/o schizoaffective d/o bipolar type) -CONTINUE Haldol 10 mg BID for psychosis  -CONTINUE Clozapine 50 mg am/50 mg pm.  Will monitor for a few days with medication regiment to assess for sedation and continued psychotic symptoms.  -Continue Senokot-S daily -Continue Cogentin 1 mg BID for EPS prophylaxis -Continue Lithium 450 mg BID for mood stability -Continue Atenolol 12.5 mg once daily - for tachycardia. Pt is asymptomatic - will monitor BP, HR, orthostatic hypotension, and for dizziness. This is recommended by [facility] prescribers guide for tachycardia, 2/2 clozapine.   Clozapine work up (labs next due 11/28) Monitoring for myocarditis, neutropenia, and hypomotility syndrome   -ANC of 3.2  -CRP 1.8 (asymptomatic)  -Echo WNL  -EKG: NSR Qtc 446  -Troponin of 3  -Inquire about bowel movements   Agitation med protocol ordered: -Zydis 10 mg q8hr prn for agitation  -Ativan 1 mg once for anxiety and severe agitation -Geodon 20 mg IM once for agitation   Continue PRN's: Tylenol, Maalox, Atarax, Milk of Magnesia, Trazodone   Medical Management Covid negative CMP: Cr of 1.45 CBC: unremarkable EtOH: <10 UDS: negative TSH: not collected A1C: 11.1 Lipids: WNL  ABD distention, transaminitis According staff this has increased since admission. KUB shows non-obstructive gas pattern with no obvious stool burden. ABD exam benign except for protuberant abdomen. ALT persistently elevated to 62. Concern was for ascites despite negative fluid wave. RUQ [place] negative for hepatic parenchymal disease or biliary disease. For ascites causes, ruled out parenchymal disease of the liver, CHF (BNP WNL), and patient likely does not have  cancer. Ammonia WNL. Lipase slightly elevated.  -Hepatitis panel negative  T2DM with insulin dependence Patient CBG still tends to be elevated into 200s to 400s. Unit restriction in evening as patient tends to gorge self during dinner. -Continue glargine 55U -Continue moderate SSI -Continue QHS correction -INCREASE Novolog from 23 U to 25 U pranidal coverage  -Continue home glipizide    HTN Continue home regimen -Lisinopril 40 mg daily -Amlodipine 10 mg daily -Clonidine 0.2 mg BID   Elevated Cr (resolved) Cr of 1.45 on a baseline of  1-1.2 at admission, likely pre-renal in the context of decreased PO. Cr down to 1.29 then normalized. Creatinine 1.16 on 11/12   France Ravens, MD 02/18/2021, 2:17 PM

## 2021-02-18 NOTE — Progress Notes (Signed)
Pt visible on the unit this evening, pt stated he had a great day, pt appeared to be responding less to internal stimuli. Pt even sang a song this evening in the dayroom.    02/18/21 2100  Psych Admission Type (Psych Patients Only)  Admission Status Involuntary  Psychosocial Assessment  Patient Complaints None  Eye Contact Fair  Facial Expression Flat;Sad  Affect Appropriate to circumstance;Flat  Speech Logical/coherent;Soft  Interaction Assertive;Forwards little  Motor Activity Slow  Appearance/Hygiene In hospital gown  Behavior Characteristics Cooperative  Mood Anxious;Pleasant  Aggressive Behavior  Effect No apparent injury  Thought Process  Coherency Tangential  Content Delusions  Delusions None reported or observed  Perception Derealization  Hallucination None reported or observed  Judgment Limited  Confusion None  Danger to Self  Current suicidal ideation? Denies  Danger to Others  Danger to Others None reported or observed

## 2021-02-18 NOTE — Progress Notes (Signed)
Pt denies SI/HI/AVH and verbally agrees to approach staff if these become apparent or before harming themselves/others. Rates depression 0/10. Rates anxiety 0/10. Rates pain 0/10. Pt has been cooperative and calm throughout the day. Pt went to Child psychotherapist group. Pt did start to take clothes off in the hallway as he was trying to give his clothes to the RN to put into the laundry. Pt was very considerate of others with using gown as a cover up. Pt is believed to have a bowel accident as to the reason for the need of washing the clothes. Scheduled medications administered to Pt, per MD orders. RN provided support and encouragement to Pt. Q15 min safety checks implemented and continued. Pt safe on the unit. RN will continue to monitor and intervene as needed.   02/18/21 0828  Psych Admission Type (Psych Patients Only)  Admission Status Involuntary  Psychosocial Assessment  Patient Complaints None  Eye Contact Fair  Facial Expression Flat;Sad  Affect Appropriate to circumstance;Flat  Speech Logical/coherent;Soft  Interaction Assertive;Forwards little  Motor Activity Slow  Appearance/Hygiene In hospital gown  Behavior Characteristics Cooperative;Calm  Mood Pleasant;Anxious  Aggressive Behavior  Effect No apparent injury  Thought Process  Coherency Tangential  Content Delusions  Delusions None reported or observed  Perception Derealization  Hallucination None reported or observed  Judgment Limited  Confusion None  Danger to Self  Current suicidal ideation? Denies  Danger to Others  Danger to Others None reported or observed

## 2021-02-18 NOTE — Group Note (Signed)
LCSW Aftercare Discharge Planning Group Note   Type of Group and Topic: Psychoeducational Group: Discharge Planning  Participation Level: Minimal  Description of Group: Discharge planning group reviews patient's anticipated discharge plans and assists patients to anticipate and address any barriers to wellness/recovery in the community. Suicide prevention education is reviewed with patients in group.  Therapeutic Goals:  1. Patients will state their anticipated discharge plan and mental health aftercare  2. Patients will identify potential barriers to wellness in the community setting  3. Patients will engage in problem solving, solution focused discussion of ways to anticipate and address barriers to wellness/recovery  Summary of Patient Progress: Pt attended and participated in group at the beginning.   Plan for Discharge/Comments: Pt reports he plans to return to his apartment at discharge and feels that there is no need for him to go to a group home as he already has stable housing.    Therapeutic Modalities: Motivational Interviewing

## 2021-02-18 NOTE — BH IP Treatment Plan (Signed)
Interdisciplinary Treatment and Diagnostic Plan Update  02/18/2021 Time of Session: 9:55am  [name] MRN: [id number]  Principal Diagnosis: Schizophrenia [facility])  Secondary Diagnoses: Principal Problem:   Schizophrenia (HCC) Active Problems:   Type 2 diabetes mellitus (HCC)   Current Medications:  Current Facility-Administered Medications  Medication Dose Route Frequency Provider Last Rate Last Admin   acetaminophen (TYLENOL) tablet 650 mg  650 mg Oral Q6H PRN Novella Olive, NP       alum & mag hydroxide-simeth (MAALOX/MYLANTA) 200-200-20 MG/5ML suspension 30 mL  30 mL Oral Q4H PRN Novella Olive, NP       amLODipine (NORVASC) tablet 10 mg  10 mg Oral Daily Novella Olive, NP   10 mg at 02/18/21 9924   aspirin EC tablet 81 mg  81 mg Oral Daily Novella Olive, NP   81 mg at 02/18/21 2683   atenolol (TENORMIN) tablet 12.5 mg  12.5 mg Oral Daily Park Pope, MD   12.5 mg at 02/18/21 4196   benztropine (COGENTIN) tablet 1 mg  1 mg Oral BID Novella Olive, NP   1 mg at 02/18/21 0830   cloNIDine (CATAPRES) tablet 0.2 mg  0.2 mg Oral BID Massengill, Harrold Donath, MD   0.2 mg at 02/18/21 0827   cloZAPine (CLOZARIL) tablet 50 mg  50 mg Oral BID Park Pope, MD   50 mg at 02/18/21 0827   glipiZIDE (GLUCOTROL XL) 24 hr tablet 10 mg  10 mg Oral Q breakfast Novella Olive, NP   10 mg at 02/18/21 0831   haloperidol (HALDOL) tablet 10 mg  10 mg Oral BID Park Pope, MD   10 mg at 02/18/21 0830   insulin aspart (novoLOG) injection 0-15 Units  0-15 Units Subcutaneous TID WC Hill, Shelbie Hutching, MD   5 Units at 02/18/21 1208   insulin aspart (novoLOG) injection 0-5 Units  0-5 Units Subcutaneous QHS Novella Olive, NP   3 Units at 02/17/21 2101   insulin aspart (novoLOG) injection 25 Units  25 Units Subcutaneous TID WC Park Pope, MD       insulin glargine-yfgn [facility]) injection 55 Units  55 Units Subcutaneous Daily Park Pope, MD   55 Units at 02/18/21 0833   lisinopril (ZESTRIL) tablet 40 mg  40 mg Oral  Daily Novella Olive, NP   40 mg at 02/18/21 0830   lithium carbonate (ESKALITH) CR tablet 450 mg  450 mg Oral Q12H Novella Olive, NP   450 mg at 02/18/21 0830   magnesium hydroxide (MILK OF MAGNESIA) suspension 30 mL  30 mL Oral Daily PRN Novella Olive, NP       OLANZapine zydis (ZYPREXA) disintegrating tablet 10 mg  10 mg Oral Q8H PRN Novella Olive, NP   10 mg at 02/03/21 2342   And   ziprasidone (GEODON) injection 20 mg  20 mg Intramuscular PRN Novella Olive, NP       polyethylene glycol (MIRALAX / GLYCOLAX) packet 17 g  17 g Oral Daily PRN Laveda Abbe, NP       senna-docusate (Senokot-S) tablet 1 tablet  1 tablet Oral Daily Carlyn Reichert, MD   1 tablet at 02/18/21 0830   traZODone (DESYREL) tablet 50 mg  50 mg Oral QHS PRN Novella Olive, NP   50 mg at 02/17/21 2100   PTA Medications: Medications Prior to Admission  Medication Sig Dispense Refill Last Dose   amLODipine (NORVASC) 10 MG tablet Take 1 tablet (  10 mg total) by mouth daily. (Patient not taking: Reported on 01/26/2021) 90 tablet 1    aspirin 81 MG EC tablet Take 1 tablet (81 mg total) by mouth daily. Swallow whole. 30 tablet 12    benztropine (COGENTIN) 1 MG tablet Take 1 tablet (1 mg total) by mouth 2 (two) times daily. (Patient not taking: Reported on 01/26/2021) 60 tablet 2    cloNIDine (CATAPRES) 0.2 MG tablet Take 0.2 mg by mouth 2 (two) times daily. (Patient not taking: Reported on 01/26/2021)      fluticasone (FLONASE) 50 MCG/ACT nasal spray Place 1 spray into both nostrils daily as needed for allergies.  (Patient not taking: Reported on 01/26/2021)      glipiZIDE (GLUCOTROL XL) 10 MG 24 hr tablet Take 1 tablet (10 mg total) by mouth daily with breakfast. 90 tablet 1    haloperidol (HALDOL) 20 MG tablet Take 1 tablet (20 mg total) by mouth at bedtime. (Patient not taking: Reported on 01/26/2021) 90 tablet 1    insulin glargine (LANTUS) 100 UNIT/ML injection Inject 10 Units into the skin daily.      insulin  lispro (HUMALOG) 100 UNIT/ML KwikPen Inject 2-10 Units into the skin 3 (three) times daily. Per sliding scale (Patient not taking: Reported on 01/26/2021)      lactulose (CHRONULAC) 10 GM/15ML solution Take 30 mLs by mouth daily as needed for mild constipation.  (Patient not taking: Reported on 01/26/2021)      LEVEMIR FLEXTOUCH 100 UNIT/ML FlexPen Inject 20 Units into the skin at bedtime. (Patient not taking: Reported on 01/26/2021)      liraglutide (VICTOZA) 18 MG/3ML SOPN Inject 1.8 mg into the skin daily. (Patient not taking: Reported on 01/26/2021)      lisinopril (ZESTRIL) 40 MG tablet Take 1 tablet (40 mg total) by mouth daily. (Patient not taking: No sig reported) 90 tablet 1    lithium carbonate (ESKALITH) 450 MG CR tablet Take 1 tablet (450 mg total) by mouth every 12 (twelve) hours. (Patient not taking: Reported on 01/26/2021) 60 tablet 2    metFORMIN (GLUCOPHAGE) 1000 MG tablet Take 1 tablet (1,000 mg total) by mouth 2 (two) times daily with a meal. (Patient not taking: Reported on 01/26/2021) 60 tablet 2    NOVOLOG FLEXPEN 100 UNIT/ML FlexPen Inject 5 Units into the skin with breakfast, with lunch, and with evening meal. (Patient not taking: Reported on 01/26/2021)      tobramycin-dexamethasone (TOBRADEX) ophthalmic solution Place 2 drops into both eyes every 6 (six) hours as needed. (Patient not taking: Reported on 01/26/2021)       Patient Stressors: Health problems   Medication change or noncompliance    Patient Strengths: Capable of independent living  Supportive family/friends   Treatment Modalities: Medication Management, Group therapy, Case management,  1 to 1 session with clinician, Psychoeducation, Recreational therapy.   Physician Treatment Plan for Primary Diagnosis: Schizophrenia (HCC) Long Term Goal(s): Improvement in symptoms so as ready for discharge   Short Term Goals: Compliance with prescribed medications will improve Ability to maintain clinical measurements  within normal limits will improve  Medication Management: Evaluate patient's response, side effects, and tolerance of medication regimen.  Therapeutic Interventions: 1 to 1 sessions, Unit Group sessions and Medication administration.  Evaluation of Outcomes: Progressing  Physician Treatment Plan for Secondary Diagnosis: Principal Problem:   Schizophrenia (HCC) Active Problems:   Type 2 diabetes mellitus (HCC)  Long Term Goal(s): Improvement in symptoms so as ready for discharge   Short Term  Goals: Compliance with prescribed medications will improve Ability to maintain clinical measurements within normal limits will improve     Medication Management: Evaluate patient's response, side effects, and tolerance of medication regimen.  Therapeutic Interventions: 1 to 1 sessions, Unit Group sessions and Medication administration.  Evaluation of Outcomes: Progressing   RN Treatment Plan for Primary Diagnosis: Schizophrenia (HCC) Long Term Goal(s): Knowledge of disease and therapeutic regimen to maintain health will improve  Short Term Goals: Ability to remain free from injury will improve, Ability to participate in decision making will improve, Ability to verbalize feelings will improve, Ability to disclose and discuss suicidal ideas, and Ability to identify and develop effective coping behaviors will improve  Medication Management: RN will administer medications as ordered by provider, will assess and evaluate patient's response and provide education to patient for prescribed medication. RN will report any adverse and/or side effects to prescribing provider.  Therapeutic Interventions: 1 on 1 counseling sessions, Psychoeducation, Medication administration, Evaluate responses to treatment, Monitor vital signs and CBGs as ordered, Perform/monitor CIWA, COWS, AIMS and Fall Risk screenings as ordered, Perform wound care treatments as ordered.  Evaluation of Outcomes: Progressing   LCSW  Treatment Plan for Primary Diagnosis: Schizophrenia (HCC) Long Term Goal(s): Safe transition to appropriate next level of care at discharge, Engage patient in therapeutic group addressing interpersonal concerns.  Short Term Goals: Engage patient in aftercare planning with referrals and resources, Increase social support, Increase emotional regulation, Facilitate acceptance of mental health diagnosis and concerns, Identify triggers associated with mental health/substance abuse issues, and Increase skills for wellness and recovery  Therapeutic Interventions: Assess for all discharge needs, 1 to 1 time with Social worker, Explore available resources and support systems, Assess for adequacy in community support network, Educate family and significant other(s) on suicide prevention, Complete Psychosocial Assessment, Interpersonal group therapy.  Evaluation of Outcomes: Progressing   Progress in Treatment: Attending groups: Yes. Participating in groups: Yes. Taking medication as prescribed: Yes. Toleration medication: Yes. Family/Significant other contact made: No, will contact:  Declined Consents  Patient understands diagnosis: No. Discussing patient identified problems/goals with staff: Yes. Medical problems stabilized or resolved: Yes. Denies suicidal/homicidal ideation: Yes. Issues/concerns per patient self-inventory: No.   New problem(s) identified: No, Describe:  None   New Short Term/Long Term Goal(s): medication stabilization, elimination of SI thoughts, development of comprehensive mental wellness plan.   Patient Goals: No Goal   Discharge Plan or Barriers: Patient recently admitted. CSW will continue to follow and assess for appropriate referrals and possible discharge planning.   Reason for Continuation of Hospitalization: Delusions  Hallucinations Medication stabilization  Estimated Length of Stay: 3 to 5 days    Scribe for Treatment Team: Aram Beecham,  Theresia Majors 02/18/2021 2:50 PM

## 2021-02-18 NOTE — [facility] Counselor (Signed)
CSW met with this patient to discuss group home placement. Pt states he has his own apartment and feels he does not need group home placement, however stated he would consider it.      Darletta Moll MSW, LCSW Clincal Social Worker  [facility], [place]

## 2021-02-18 NOTE — Progress Notes (Signed)
The focus of this group is to help patients establish daily goals to achieve during treatment and discuss how the patient can incorporate goal setting into their daily lives to aide in recovery.The patient attended group but not able  to stay on subject

## 2021-02-19 LAB — GLUCOSE, CAPILLARY
Glucose-Capillary: 175 mg/dL — ABNORMAL HIGH (ref 70–99)
Glucose-Capillary: 233 mg/dL — ABNORMAL HIGH (ref 70–99)
Glucose-Capillary: 220 mg/dL — ABNORMAL HIGH (ref 70–99)
Glucose-Capillary: 182 mg/dL — ABNORMAL HIGH (ref 70–99)

## 2021-02-19 NOTE — Progress Notes (Addendum)
    D. Pt is polite upon approach, calm and cooperative on the unit- pt observed in the dayroom at times with minimal interactions among peers. Pt currently denies SI/HI and A/VH. PT does not appear to be responding to internal stimuli, but remains delusional in regards to his being [name]' brother A. Labs and vitals monitored. Pt compliant with medications. Pt supported emotionally and encouraged to express concerns and ask questions.   R. Pt remains safe with 15 minute checks. Will continue POC.          02/19/21 1300  Psych Admission Type (Psych Patients Only)  Admission Status Involuntary  Psychosocial Assessment  Patient Complaints None  Eye Contact Fair  Facial Expression Flat;Sad  Affect Appropriate to circumstance;Flat  Speech Logical/coherent;Soft  Interaction Assertive;Forwards little  Motor Activity Slow  Appearance/Hygiene In hospital gown  Behavior Characteristics Cooperative  Mood Pleasant  Aggressive Behavior  Effect No apparent injury  Thought Process  Coherency Tangential  Content Delusions  Delusions None reported or observed  Perception Derealization  Hallucination None reported or observed  Judgment Limited  Confusion None  Danger to Self  Current suicidal ideation? Denies  Danger to Others  Danger to Others None reported or observed

## 2021-02-19 NOTE — Progress Notes (Signed)
   02/19/21 2200  Psych Admission Type (Psych Patients Only)  Admission Status Involuntary  Psychosocial Assessment  Patient Complaints None  Eye Contact Fair  Facial Expression Flat;Sad  Affect Appropriate to circumstance;Flat  Speech Logical/coherent;Soft  Interaction Assertive;Forwards little  Motor Activity Slow  Appearance/Hygiene In hospital gown  Behavior Characteristics Cooperative  Mood Pleasant  Aggressive Behavior  Effect No apparent injury  Thought Process  Coherency Tangential  Content Delusions  Delusions None reported or observed  Perception Derealization  Hallucination None reported or observed  Judgment Limited  Confusion None  Danger to Self  Current suicidal ideation? Denies  Danger to Others  Danger to Others None reported or observed

## 2021-02-19 NOTE — Progress Notes (Signed)
Pt was encouraged to go to orientation/goals group but didn't attend.  

## 2021-02-19 NOTE — [facility] Group Notes (Signed)
[facility] Group Notes:  (Nursing/MHT/Case Management/Adjunct)  Date:  02/19/2021  Time:  4:42 PM  Type of Therapy:  Psychoeducational Skills  Participation Level:  Minimal  Participation Quality:  Drowsy  Affect:  Appropriate  Cognitive:  Alert  Insight:  Improving  Engagement in Group:  Engaged and Improving  Modes of Intervention:  Activity, Discussion, and Support  Summary of Progress/Problems: Group consisted of reading and discussing a poem called "There's a Hole in My Sidewalk" which talk about learning from your mistakes and how someone will deal with the same issues until they change their behavior and want to change for the better in order to stop those mistakes from happening.   Tishara Pizano J Giovannina Mun 02/19/2021, 4:42 PM

## 2021-02-19 NOTE — Progress Notes (Addendum)
[facility] MD Progress Note  02/19/2021 12:22 PM Mitchell [name]  MRN:  [id number] Subjective:   Mitchell [name] is a 60 year old male with a PPHx of Schizophrenia and multiple hospitalizations presents under IVC for acute psychosis.   Psychiatric Team made the following recommendations yesterday: -CONTINUE Haldol 10 mg BID for psychosis  -CONTINUE Clozapine 50 mg am/50 mg pm.    Case was discussed in the multidisciplinary team. MAR was reviewed and patient was compliant with medications.   On interview patient reports he is doing well. His delusions remain but he is note noted to be responding to internal stimuli by this Probation officer or staff.  He reports his sleep is good.  He reports his appetite is good.  He reports no SI, HI, or AVH.  He reports that he is having no issues with his medications and feels that they are helping him.  Discussed the topic of group homes that was started the other day and he again stated that he did not need that because he has an apartment and does well there.  Discussed that we would put in referrals to [facility] and [facility] to help with medications and appointment scheduling.  He thinks this would be beneficial. He reports no other concerns at present.  ROS for clozapine was reassessed today: Malaise: denies Chest pain: denies Shortness of breath: denies Exertional capacity: denies Tachycardia: denies Cough: denies Fever: denies Sedation: denies Orthostatic hypotension (dizziness with standing): denies Hypersalivation: denies Constipation: denies Symptoms of GERD: denies Nausea: denies Nocturnal enuresis: denies   Principal Problem: Schizophrenia ([place]) Diagnosis: Principal Problem:   Schizophrenia ([place]) Active Problems:   Type 2 diabetes mellitus ([place])   Past Psychiatric History: Schizophrenia and multiple hospitalizations  Past Medical History:  Past Medical History:  Diagnosis Date   Bipolar affective disorder ([place])    takes Zyprexa daily   Diabetes  mellitus    takes Victoza,Metformin,and Glipizide daily   Hypertension    takes Amlodipine,Lisinopril and Clonidine daily   Hyponatremia    history of   Mental disorder    takes Lithium daily   Schizoaffective disorder    takes Trazodone nightly   Seasonal allergies    takes Claritin daily   Sleep apnea    sleep study >69yrs ago   Stroke [facility])    left arm weakness    Past Surgical History:  Procedure Laterality Date   CATARACT EXTRACTION W/PHACO Right 02/14/2013   Procedure: CATARACT EXTRACTION PHACO AND INTRAOCULAR LENS PLACEMENT ([place]);  Surgeon: Adonis Brook, MD;  Location: [place];  Service: Ophthalmology;  Laterality: Right;   CATARACT EXTRACTION W/PHACO Left 06/13/2013   Procedure: CATARACT EXTRACTION PHACO AND INTRAOCULAR LENS PLACEMENT ([facility]);  Surgeon: Adonis Brook, MD;  Location: [place];  Service: Ophthalmology;  Laterality: Left;   CIRCUMCISION  20 yrs. ago   EYE SURGERY     Family History: History reviewed. No pertinent family history. Family Psychiatric  History: BAPD Social History:  Social History   Substance and Sexual Activity  Alcohol Use Not Currently     Social History   Substance and Sexual Activity  Drug Use No    Social History   Socioeconomic History   Marital status: Divorced    Spouse name: Not on file   Number of children: Not on file   Years of education: Not on file   Highest education level: Not on file  Occupational History   Not on file  Tobacco Use   Smoking status: Never   Smokeless tobacco:  Never  Vaping Use   Vaping Use: Never used  Substance and Sexual Activity   Alcohol use: Not Currently   Drug use: No   Sexual activity: Yes    Birth control/protection: None  Other Topics Concern   Not on file  Social History Narrative   Not on file   Social Determinants of Health   Financial Resource Strain: Not on file  Food Insecurity: Not on file  Transportation Needs: Not on file  Physical Activity: Not on file  Stress: Not on  file  Social Connections: Not on file   Additional Social History:                         Sleep: Good  Appetite:  Good  Current Medications: Current Facility-Administered Medications  Medication Dose Route Frequency Provider Last Rate Last Admin   acetaminophen (TYLENOL) tablet 650 mg  650 mg Oral Q6H PRN Chalmers Guest, NP       alum & mag hydroxide-simeth (MAALOX/MYLANTA) 200-200-20 MG/5ML suspension 30 mL  30 mL Oral Q4H PRN Chalmers Guest, NP       amLODipine (NORVASC) tablet 10 mg  10 mg Oral Daily Chalmers Guest, NP   10 mg at 02/19/21 0801   aspirin EC tablet 81 mg  81 mg Oral Daily Chalmers Guest, NP   81 mg at 02/19/21 0800   atenolol (TENORMIN) tablet 12.5 mg  12.5 mg Oral Daily France Ravens, MD   12.5 mg at 02/19/21 0800   benztropine (COGENTIN) tablet 1 mg  1 mg Oral BID Chalmers Guest, NP   1 mg at 02/19/21 0801   cloNIDine (CATAPRES) tablet 0.2 mg  0.2 mg Oral BID Raad Clayson, Ovid Curd, MD   0.2 mg at 02/19/21 0800   cloZAPine (CLOZARIL) tablet 50 mg  50 mg Oral BID France Ravens, MD   50 mg at 02/19/21 0801   glipiZIDE (GLUCOTROL XL) 24 hr tablet 10 mg  10 mg Oral Q breakfast Chalmers Guest, NP   10 mg at 02/19/21 0800   haloperidol (HALDOL) tablet 10 mg  10 mg Oral BID France Ravens, MD   10 mg at 02/19/21 0800   insulin aspart (novoLOG) injection 0-15 Units  0-15 Units Subcutaneous TID WC Hill, Jackie Plum, MD   5 Units at 02/19/21 1215   insulin aspart (novoLOG) injection 0-5 Units  0-5 Units Subcutaneous QHS Chalmers Guest, NP   3 Units at 02/17/21 2101   insulin aspart (novoLOG) injection 25 Units  25 Units Subcutaneous TID WC France Ravens, MD   25 Units at 02/19/21 1213   insulin glargine-yfgn (SEMGLEE) injection 55 Units  55 Units Subcutaneous Daily France Ravens, MD   55 Units at 02/19/21 0758   lisinopril (ZESTRIL) tablet 40 mg  40 mg Oral Daily Chalmers Guest, NP   40 mg at 02/19/21 0800   lithium carbonate (ESKALITH) CR tablet 450 mg  450 mg Oral Q12H Chalmers Guest, NP   450 mg at 02/19/21 0800   magnesium hydroxide (MILK OF MAGNESIA) suspension 30 mL  30 mL Oral Daily PRN Chalmers Guest, NP       OLANZapine zydis (ZYPREXA) disintegrating tablet 10 mg  10 mg Oral Q8H PRN Chalmers Guest, NP   10 mg at 02/03/21 2342   And   ziprasidone (GEODON) injection 20 mg  20 mg Intramuscular PRN Chalmers Guest, NP  polyethylene glycol (MIRALAX / GLYCOLAX) packet 17 g  17 g Oral Daily PRN Laveda Abbe, NP       senna-docusate (Senokot-S) tablet 1 tablet  1 tablet Oral Daily Carlyn Reichert, MD   1 tablet at 02/19/21 0801   traZODone (DESYREL) tablet 50 mg  50 mg Oral QHS PRN Novella Olive, NP   50 mg at 02/18/21 2036    Lab Results:  Results for orders placed or performed during the hospital encounter of 01/28/21 (from the past 48 hour(s))  Glucose, capillary     Status: Abnormal   Collection Time: 02/17/21  5:16 PM  Result Value Ref Range   Glucose-Capillary 220 (H) 70 - 99 mg/dL    Comment: Glucose reference range applies only to samples taken after fasting for at least 8 hours.  Glucose, capillary     Status: Abnormal   Collection Time: 02/17/21  7:37 PM  Result Value Ref Range   Glucose-Capillary 291 (H) 70 - 99 mg/dL    Comment: Glucose reference range applies only to samples taken after fasting for at least 8 hours.  Glucose, capillary     Status: Abnormal   Collection Time: 02/18/21  5:45 AM  Result Value Ref Range   Glucose-Capillary 194 (H) 70 - 99 mg/dL    Comment: Glucose reference range applies only to samples taken after fasting for at least 8 hours.  Glucose, capillary     Status: Abnormal   Collection Time: 02/18/21 11:52 AM  Result Value Ref Range   Glucose-Capillary 217 (H) 70 - 99 mg/dL    Comment: Glucose reference range applies only to samples taken after fasting for at least 8 hours.  Glucose, capillary     Status: Abnormal   Collection Time: 02/18/21  5:11 PM  Result Value Ref Range   Glucose-Capillary 231 (H) 70 - 99  mg/dL    Comment: Glucose reference range applies only to samples taken after fasting for at least 8 hours.  Glucose, capillary     Status: Abnormal   Collection Time: 02/18/21  7:38 PM  Result Value Ref Range   Glucose-Capillary 154 (H) 70 - 99 mg/dL    Comment: Glucose reference range applies only to samples taken after fasting for at least 8 hours.  Glucose, capillary     Status: Abnormal   Collection Time: 02/19/21  5:48 AM  Result Value Ref Range   Glucose-Capillary 175 (H) 70 - 99 mg/dL    Comment: Glucose reference range applies only to samples taken after fasting for at least 8 hours.   Comment 1 Notify RN   Glucose, capillary     Status: Abnormal   Collection Time: 02/19/21 11:57 AM  Result Value Ref Range   Glucose-Capillary 233 (H) 70 - 99 mg/dL    Comment: Glucose reference range applies only to samples taken after fasting for at least 8 hours.    Blood Alcohol level:  Lab Results  Component Value Date   ETH <10 01/26/2021   ETH <10 01/21/2021    Metabolic Disorder Labs: Lab Results  Component Value Date   HGBA1C 11.1 (H) 01/26/2021   MPG 271.87 01/26/2021   MPG 202.99 01/07/2019   No results found for: PROLACTIN Lab Results  Component Value Date   CHOL 140 01/30/2021   TRIG 95 01/30/2021   HDL 49 01/30/2021   CHOLHDL 2.9 01/30/2021   VLDL 19 01/30/2021   LDLCALC 72 01/30/2021   LDLCALC 67 05/20/2020    Physical  Findings:   Musculoskeletal: Strength & Muscle Tone: within normal limits Gait & Station: normal Patient leans: N/A  Psychiatric Specialty Exam:  Presentation  General Appearance: Appropriate for Environment; Casual; Bizarre  Eye Contact:Fair  Speech:Clear and Coherent; Normal Rate  Speech Volume:Normal  Handedness:Right   Mood and Affect  Mood:Euthymic  Affect:Congruent   Thought Process  Thought Processes:Coherent  Descriptions of Associations:Intact  Orientation:Full (Time, Place and Person)  Thought  Content:continues to have grandiose delusions   History of Schizophrenia/Schizoaffective disorder:Yes  Duration of Psychotic Symptoms:Greater than six months  Hallucinations:Hallucinations: None Ideas of Reference:None  Suicidal Thoughts:Suicidal Thoughts: No Homicidal Thoughts:Homicidal Thoughts: No  Sensorium  Memory:Immediate Fair; Recent Fair  Judgment:Fair  Insight:Fair   Executive Functions  Concentration:Fair  Attention Span:Fair  [place]  Language:Good   Psychomotor Activity  Psychomotor Activity:Psychomotor Activity: Normal  Assets  Assets:Communication Skills; Resilience   Sleep  Sleep:Sleep: Good Number of Hours of Sleep: 7.5   Physical Exam: Physical Exam Vitals and nursing note reviewed.  Constitutional:      General: He is not in acute distress.    Appearance: Normal appearance. He is obese. He is not ill-appearing or toxic-appearing.  HENT:     Head: Normocephalic and atraumatic.  Pulmonary:     Effort: Pulmonary effort is normal.  Musculoskeletal:        General: Normal range of motion.  Neurological:     General: No focal deficit present.     Mental Status: He is alert.   Review of Systems  Respiratory:  Negative for cough and shortness of breath.   Cardiovascular:  Negative for chest pain.  Gastrointestinal:  Negative for abdominal pain, constipation, diarrhea, nausea and vomiting.  Neurological:  Negative for weakness and headaches.  Psychiatric/Behavioral:  Negative for depression and suicidal ideas. The patient is not nervous/anxious.   Blood pressure 129/79, pulse 97, temperature 98.2 F (36.8 C), temperature source Oral, resp. rate 18, height 5' 3.5" (1.613 m), weight 91.6 kg, SpO2 96 %. Body mass index is 35.22 kg/m.   Treatment Plan Summary: Daily contact with patient to assess and evaluate symptoms and progress in treatment and Medication management  Mitchell [name] is a 60 year old male with  a PPHx of Schizophrenia and multiple hospitalizations presents under IVC for acute psychosis.  Patient is improving and no longer acutely delusional.  He continues to have some sedation with his medication so we will not make any further increases to his Clozaril.  His Blood Sugars have been improving with the increase in his insulin.  As he is very clear in not going to a group home we will send referrals to [facility] and [facility] to help coordinate his care once he is discharged.  We will continue to monitor.     Schizophrenia: -Continue Haldol 10 mg BID -Continue Clozaril to 50 mg BID -Continue Lithium 450 mg BID -Continue Senokot-S daily -Continue Cogentin 1 mg BID for EPS prophylaxis -Continue Atenolol 12.5 mg daily -Clozaril labs next due 11/28   HTN: -Continue Lisinopril 40 mg daily -Continue Amlodipine 10 mg daily -Continue Clonidine 0.2 mg BID   Type 2 Diabetes: -Continue glargine 55 units -Continue moderate SSI -Continue QHS correction -Continue 25 units pranidal coverage  -Continue home Glipizide   ABD distention, transaminitis: According staff this has increased since admission. KUB shows non-obstructive gas pattern with no obvious stool burden. ABD exam benign except for protuberant abdomen. ALT persistently elevated to 62. Concern was for ascites despite negative fluid wave. RUQ  [place] negative for hepatic parenchymal disease or biliary disease. For ascites causes, ruled out parenchymal disease of the liver, CHF (BNP WNL), and patient likely does not have cancer. Ammonia WNL. Lipase slightly elevated.  -Hepatitis panel negative   -Continue PRN's: Tylenol, Maalox, Atarax, Milk of Magnesia, Trazodone   Briant Cedar, MD 02/19/2021, 12:22 PM  Total Time Spent in Direct Patient Care:  I personally spent 20 minutes on the unit in direct patient care. The direct patient care time included face-to-face time with the patient, reviewing the patient's chart, communicating with  other professionals, and coordinating care. Greater than 50% of this time was spent in counseling or coordinating care with the patient regarding goals of hospitalization, psycho-education, and discharge planning needs.  I have independently evaluated the patient during a face-to-face assessment on 02/19/21. I reviewed the patient's chart, and I participated in key portions of the service. I discussed the case with the [employer], and I agree with the assessment and plan of care as documented in the House Officer's note, as addended by me or notated below:  I directly edited the note, as above.  Janine Limbo, MD Psychiatrist

## 2021-02-20 LAB — GLUCOSE, CAPILLARY
Glucose-Capillary: 143 mg/dL — ABNORMAL HIGH (ref 70–99)
Glucose-Capillary: 149 mg/dL — ABNORMAL HIGH (ref 70–99)
Glucose-Capillary: 272 mg/dL — ABNORMAL HIGH (ref 70–99)
Glucose-Capillary: 300 mg/dL — ABNORMAL HIGH (ref 70–99)

## 2021-02-20 NOTE — Progress Notes (Signed)
   02/20/21 2200  Psych Admission Type (Psych Patients Only)  Admission Status Involuntary  Psychosocial Assessment  Patient Complaints None  Eye Contact Fair  Facial Expression Animated  Affect Appropriate to circumstance  Speech Logical/coherent  Interaction Assertive  Motor Activity Slow  Appearance/Hygiene Disheveled  Behavior Characteristics Cooperative  Mood Pleasant  Thought Process  Coherency WDL  Content Confabulation;Delusions  Delusions None reported or observed  Perception WDL  Hallucination None reported or observed  Judgment WDL  Confusion None  Danger to Self  Current suicidal ideation? Denies  Danger to Others  Danger to Others None reported or observed

## 2021-02-20 NOTE — Plan of Care (Signed)
  Problem: Coping: Goal: Ability to verbalize frustrations and anger appropriately will improve Outcome: Progressing Goal: Ability to demonstrate self-control will improve Outcome: Progressing   Problem: Health Behavior/Discharge Planning: Goal: Identification of resources available to assist in meeting health care needs will improve Outcome: Progressing   

## 2021-02-20 NOTE — Progress Notes (Signed)
[facility] MD Progress Note  02/20/2021 2:05 PM KEAGIN ARNTZEN  MRN:  [id number] Subjective:   Mitchell [name] is a 60 year old male with a PPHx of Schizophrenia and multiple hospitalizations presents under IVC for acute psychosis.   Psychiatric Team made the following recommendations yesterday: -Continue Haldol 10 mg BID for psychosis  -Continue Clozapine 50 mg am/50 mg pm.    Case was discussed in the multidisciplinary team. MAR was reviewed and patient was compliant with medications.  He received PRN Trazodone yesterday.    On interview patient reports that he is "doing fine".  He reports that he is sleeping good.  He reports his appetite is doing good.  He reports no SI, HI, or AVH.  When asked if he was having any sedating effect from his medication he reports now that he thinks his medications are doing good for him and he has no side effects from them.  Inquired if he would want want to go to the group home and he again stated that he was perfectly fine and his own apartment.  Discussed with him that because it is the holiday we were unable to arrange with [facility] and [facility] then but that on Monday when they are open again he would arrange these for him.  He was understanding of this and agreeable to it.  He has no other concerns at present.    ROS for clozapine was reassessed today: Malaise: denies Chest pain: denies Shortness of breath: denies Exertional capacity: denies Tachycardia: denies Cough: denies Fever: denies Sedation: denies Orthostatic hypotension (dizziness with standing): denies Hypersalivation: denies Constipation: denies Symptoms of GERD: denies Nausea: denies Nocturnal enuresis: denies   Principal Problem: Schizophrenia ([place]) Diagnosis: Principal Problem:   Schizophrenia ([place]) Active Problems:   Type 2 diabetes mellitus ([place])   Past Psychiatric History: Schizophrenia and multiple hospitalizations  Past Medical History:  Past Medical History:  Diagnosis Date    Bipolar affective disorder ([place])    takes Zyprexa daily   Diabetes mellitus    takes Victoza,Metformin,and Glipizide daily   Hypertension    takes Amlodipine,Lisinopril and Clonidine daily   Hyponatremia    history of   Mental disorder    takes Lithium daily   Schizoaffective disorder    takes Trazodone nightly   Seasonal allergies    takes Claritin daily   Sleep apnea    sleep study >73yrs ago   Stroke [facility])    left arm weakness    Past Surgical History:  Procedure Laterality Date   CATARACT EXTRACTION W/PHACO Right 02/14/2013   Procedure: CATARACT EXTRACTION PHACO AND INTRAOCULAR LENS PLACEMENT ([place]);  Surgeon: Adonis Brook, MD;  Location: [place];  Service: Ophthalmology;  Laterality: Right;   CATARACT EXTRACTION W/PHACO Left 06/13/2013   Procedure: CATARACT EXTRACTION PHACO AND INTRAOCULAR LENS PLACEMENT ([facility]);  Surgeon: Adonis Brook, MD;  Location: [place];  Service: Ophthalmology;  Laterality: Left;   CIRCUMCISION  20 yrs. ago   EYE SURGERY     Family History: History reviewed. No pertinent family history. Family Psychiatric  History: BAPD Social History:  Social History   Substance and Sexual Activity  Alcohol Use Not Currently     Social History   Substance and Sexual Activity  Drug Use No    Social History   Socioeconomic History   Marital status: Divorced    Spouse name: Not on file   Number of children: Not on file   Years of education: Not on file   Highest education level: Not  on file  Occupational History   Not on file  Tobacco Use   Smoking status: Never   Smokeless tobacco: Never  Vaping Use   Vaping Use: Never used  Substance and Sexual Activity   Alcohol use: Not Currently   Drug use: No   Sexual activity: Yes    Birth control/protection: None  Other Topics Concern   Not on file  Social History Narrative   Not on file   Social Determinants of Health   Financial Resource Strain: Not on file  Food Insecurity: Not on file   Transportation Needs: Not on file  Physical Activity: Not on file  Stress: Not on file  Social Connections: Not on file   Additional Social History:                         Sleep: Good  Appetite:  Good  Current Medications: Current Facility-Administered Medications  Medication Dose Route Frequency Provider Last Rate Last Admin   acetaminophen (TYLENOL) tablet 650 mg  650 mg Oral Q6H PRN Novella Olive, NP       alum & mag hydroxide-simeth (MAALOX/MYLANTA) 200-200-20 MG/5ML suspension 30 mL  30 mL Oral Q4H PRN Novella Olive, NP       amLODipine (NORVASC) tablet 10 mg  10 mg Oral Daily Novella Olive, NP   10 mg at 02/20/21 0768   aspirin EC tablet 81 mg  81 mg Oral Daily Novella Olive, NP   81 mg at 02/20/21 0807   atenolol (TENORMIN) tablet 12.5 mg  12.5 mg Oral Daily Park Pope, MD   12.5 mg at 02/20/21 0881   benztropine (COGENTIN) tablet 1 mg  1 mg Oral BID Novella Olive, NP   1 mg at 02/20/21 1031   cloNIDine (CATAPRES) tablet 0.2 mg  0.2 mg Oral BID Massengill, Harrold Donath, MD   0.2 mg at 02/20/21 5945   cloZAPine (CLOZARIL) tablet 50 mg  50 mg Oral BID Park Pope, MD   50 mg at 02/20/21 8592   glipiZIDE (GLUCOTROL XL) 24 hr tablet 10 mg  10 mg Oral Q breakfast Novella Olive, NP   10 mg at 02/20/21 1203   haloperidol (HALDOL) tablet 10 mg  10 mg Oral BID Park Pope, MD   10 mg at 02/20/21 0807   insulin aspart (novoLOG) injection 0-15 Units  0-15 Units Subcutaneous TID WC Hill, Shelbie Hutching, MD   2 Units at 02/20/21 9244   insulin aspart (novoLOG) injection 0-5 Units  0-5 Units Subcutaneous QHS Novella Olive, NP   3 Units at 02/17/21 2101   insulin aspart (novoLOG) injection 25 Units  25 Units Subcutaneous TID WC Park Pope, MD   25 Units at 02/20/21 1203   insulin glargine-yfgn (SEMGLEE) injection 55 Units  55 Units Subcutaneous Daily Park Pope, MD   55 Units at 02/20/21 0807   lisinopril (ZESTRIL) tablet 40 mg  40 mg Oral Daily Novella Olive, NP   40 mg at  02/20/21 0807   lithium carbonate (ESKALITH) CR tablet 450 mg  450 mg Oral Q12H Novella Olive, NP   450 mg at 02/20/21 0807   magnesium hydroxide (MILK OF MAGNESIA) suspension 30 mL  30 mL Oral Daily PRN Novella Olive, NP       OLANZapine zydis (ZYPREXA) disintegrating tablet 10 mg  10 mg Oral Q8H PRN Novella Olive, NP   10 mg at 02/03/21 2342  And   ziprasidone (GEODON) injection 20 mg  20 mg Intramuscular PRN Chalmers Guest, NP       polyethylene glycol (MIRALAX / GLYCOLAX) packet 17 g  17 g Oral Daily PRN Ethelene Hal, NP       senna-docusate (Senokot-S) tablet 1 tablet  1 tablet Oral Daily Corky Sox, MD   1 tablet at 02/20/21 [id number]   traZODone (DESYREL) tablet 50 mg  50 mg Oral QHS PRN Chalmers Guest, NP   50 mg at 02/19/21 2104    Lab Results:  Results for orders placed or performed during the hospital encounter of 01/28/21 (from the past 48 hour(s))  Glucose, capillary     Status: Abnormal   Collection Time: 02/18/21  5:11 PM  Result Value Ref Range   Glucose-Capillary 231 (H) 70 - 99 mg/dL    Comment: Glucose reference range applies only to samples taken after fasting for at least 8 hours.  Glucose, capillary     Status: Abnormal   Collection Time: 02/18/21  7:38 PM  Result Value Ref Range   Glucose-Capillary 154 (H) 70 - 99 mg/dL    Comment: Glucose reference range applies only to samples taken after fasting for at least 8 hours.  Glucose, capillary     Status: Abnormal   Collection Time: 02/19/21  5:48 AM  Result Value Ref Range   Glucose-Capillary 175 (H) 70 - 99 mg/dL    Comment: Glucose reference range applies only to samples taken after fasting for at least 8 hours.   Comment 1 Notify RN   Glucose, capillary     Status: Abnormal   Collection Time: 02/19/21 11:57 AM  Result Value Ref Range   Glucose-Capillary 233 (H) 70 - 99 mg/dL    Comment: Glucose reference range applies only to samples taken after fasting for at least 8 hours.  Glucose, capillary      Status: Abnormal   Collection Time: 02/19/21  4:59 PM  Result Value Ref Range   Glucose-Capillary 220 (H) 70 - 99 mg/dL    Comment: Glucose reference range applies only to samples taken after fasting for at least 8 hours.  Glucose, capillary     Status: Abnormal   Collection Time: 02/19/21  8:23 PM  Result Value Ref Range   Glucose-Capillary 182 (H) 70 - 99 mg/dL    Comment: Glucose reference range applies only to samples taken after fasting for at least 8 hours.  Glucose, capillary     Status: Abnormal   Collection Time: 02/20/21  5:56 AM  Result Value Ref Range   Glucose-Capillary 143 (H) 70 - 99 mg/dL    Comment: Glucose reference range applies only to samples taken after fasting for at least 8 hours.  Glucose, capillary     Status: Abnormal   Collection Time: 02/20/21 11:55 AM  Result Value Ref Range   Glucose-Capillary 149 (H) 70 - 99 mg/dL    Comment: Glucose reference range applies only to samples taken after fasting for at least 8 hours.    Blood Alcohol level:  Lab Results  Component Value Date   ETH <10 01/26/2021   ETH <10 [id number]    Metabolic Disorder Labs: Lab Results  Component Value Date   HGBA1C 11.1 (H) 01/26/2021   MPG 271.87 01/26/2021   MPG 202.99 01/07/2019   No results found for: PROLACTIN Lab Results  Component Value Date   CHOL 140 01/30/2021   TRIG 95 01/30/2021   HDL 49 01/30/2021  CHOLHDL 2.9 01/30/2021   VLDL 19 01/30/2021   LDLCALC 72 01/30/2021   LDLCALC 67 05/20/2020    Physical Findings:   Musculoskeletal: Strength & Muscle Tone: within normal limits Gait & Station: normal Patient leans: N/A  Psychiatric Specialty Exam:  Presentation  General Appearance: Casual; Bizarre; Appropriate for Environment (wearing a button shirt over his patient gown)  Eye Contact:Fair  Speech:Clear and Coherent; Normal Rate  Speech Volume:Normal  Handedness:Right   Mood and Affect  Mood:Euthymic  Affect:Congruent   Thought  Process  Thought Processes:Coherent  Descriptions of Associations:Intact  Orientation:Full (Time, Place and Person)  Thought Content:continues to have grandiose delusions   History of Schizophrenia/Schizoaffective disorder:Yes  Duration of Psychotic Symptoms:Greater than six months  Hallucinations:Hallucinations: None Ideas of Reference:None  Suicidal Thoughts:Suicidal Thoughts: No Homicidal Thoughts:Homicidal Thoughts: No  Sensorium  Memory:Immediate Fair; Recent Fair  Judgment:Fair  Insight:Fair   Executive Functions  Concentration:Fair  Attention Span:Fair  [place]  Language:Good   Psychomotor Activity  Psychomotor Activity:Psychomotor Activity: Normal  Assets  Assets:Communication Skills; Resilience   Sleep  Sleep:Sleep: Good Number of Hours of Sleep: 8.25   Physical Exam: Physical Exam Vitals and nursing note reviewed.  Constitutional:      General: He is not in acute distress.    Appearance: Normal appearance. He is obese. He is not ill-appearing or toxic-appearing.  HENT:     Head: Normocephalic and atraumatic.  Pulmonary:     Effort: Pulmonary effort is normal.  Musculoskeletal:        General: Normal range of motion.  Neurological:     General: No focal deficit present.     Mental Status: He is alert.   Review of Systems  Respiratory:  Negative for cough and shortness of breath.   Cardiovascular:  Negative for chest pain.  Gastrointestinal:  Negative for abdominal pain, constipation, diarrhea, nausea and vomiting.  Neurological:  Negative for weakness and headaches.  Psychiatric/Behavioral:  Negative for depression, hallucinations and suicidal ideas. The patient is not nervous/anxious.   Blood pressure 139/78, pulse (!) 107, temperature 98.6 F (37 C), temperature source Oral, resp. rate 18, height 5' 3.5" (1.613 m), weight 91.6 kg, SpO2 97 %. Body mass index is 35.22 kg/m.   Treatment Plan  Summary: Daily contact with patient to assess and evaluate symptoms and progress in treatment and Medication management  Adrial Ty is a 60 year old male with a PPHx of Schizophrenia and multiple hospitalizations presents under IVC for acute psychosis.   Patient continues to appear sedated.  Given the risk of falls will not increase his Clozaril further as he appears to be at his baseline of delusions without distress.  We will make referrals to [facility] on Monday to ensure he has support to safely return to his apartment.  We will not make any medication changes at this time.  We will continue to monitor.    Schizophrenia: -Continue Haldol 10 mg BID -Continue Clozaril 50 mg BID -Continue Lithium 450 mg BID -Continue Senokot-S daily -Continue Cogentin 1 mg BID for EPS prophylaxis -Continue Atenolol 12.5 mg daily -Clozaril labs next due 11/28   HTN: -Continue Lisinopril 40 mg daily -Continue Amlodipine 10 mg daily -Continue Clonidine 0.2 mg BID   Type 2 Diabetes: -Continue glargine 55 units -Continue moderate SSI -Continue QHS correction -Continue 25 units pranidal coverage  -Continue home Glipizide   ABD distention, transaminitis: According staff this has increased since admission. KUB shows non-obstructive gas pattern with no obvious stool burden.  ABD exam benign except for protuberant abdomen. ALT persistently elevated to 62. Concern was for ascites despite negative fluid wave. RUQ [place] negative for hepatic parenchymal disease or biliary disease. For ascites causes, ruled out parenchymal disease of the liver, CHF (BNP WNL), and patient likely does not have cancer. Ammonia WNL. Lipase slightly elevated.  -Hepatitis panel negative   -Continue PRN's: Tylenol, Maalox, Atarax, Milk of Magnesia, Trazodone   Lauro Franklin, MD 02/20/2021, 2:05 PM

## 2021-02-20 NOTE — Progress Notes (Signed)
Progress note    02/20/21 0807  Psych Admission Type (Psych Patients Only)  Admission Status Involuntary  Psychosocial Assessment  Patient Complaints None  Eye Contact Fair  Facial Expression Animated  Affect Appropriate to circumstance  Speech Logical/coherent  Interaction Assertive  Motor Activity Slow  Appearance/Hygiene In hospital gown  Behavior Characteristics Cooperative;Appropriate to situation  Mood Pleasant  Thought Process  Coherency WDL  Content Confabulation;Delusions  Delusions None reported or observed  Perception WDL  Hallucination None reported or observed  Judgment WDL  Confusion None  Danger to Self  Current suicidal ideation? Denies  Danger to Others  Danger to Others None reported or observed

## 2021-02-20 NOTE — Progress Notes (Signed)
Did not attend group 

## 2021-02-20 NOTE — [facility] Group Notes (Signed)
[facility] Group Notes:  (Nursing/MHT/Case Management/Adjunct)  Date:  02/20/2021  Time:  2:52 PM  Type of Therapy:   Therapeutic Relaxation group  Participation Level:  Minimal  Participation Quality:  Appropriate and Attentive  Affect:  Appropriate  Cognitive:  Alert  Insight:  Improving  Engagement in Group:  Engaged  Modes of Intervention:  Activity, Discussion, and Support  Summary of Progress/Problems:  We dicussed relaxation which can be achieved through meditation, autogenics, and progressive muscle relaxation. Relaxation helps improve coping with stress. Stress is the leading cause of mental problems and physical problems, therefore feeling relaxed is beneficial for a person's health. Action that took place in group was positive visualization technique along with meditative music.   Mitchell [name] J Daryll Spisak 02/20/2021, 2:52 PM

## 2021-02-20 NOTE — Group Note (Signed)
[facility] LCSW Group Therapy Note   Group Date: 02/20/2021 Start Time: 1300 End Time: 1400   Type of Therapy/Topic:  Group Therapy:  Emotion Regulation  Participation Level:  None   Mood: n/a   Description of Group:    The purpose of this group is to assist patients in learning to regulate negative emotions and experience positive emotions. Patients will be guided to discuss ways in which they have been vulnerable to their negative emotions. These vulnerabilities will be juxtaposed with experiences of positive emotions or situations, and patients challenged to use positive emotions to combat negative ones. Special emphasis will be placed on coping with negative emotions in conflict situations, and patients will process healthy conflict resolution skills.  Therapeutic Goals: Patient will identify two positive emotions or experiences to reflect on in order to balance out negative emotions:  Patient will label two or more emotions that they find the most difficult to experience:  Patient will be able to demonstrate positive conflict resolution skills through discussion or role plays:   Summary of Patient Progress: Patient presented for group late, stayed for roughly 30 minutes, slept when he was present and left after he woke up.     Therapeutic Modalities:   Cognitive Behavioral Therapy Feelings Identification Dialectical Behavioral Therapy   Corky Crafts, [place]

## 2021-02-21 LAB — GLUCOSE, CAPILLARY
Glucose-Capillary: 157 mg/dL — ABNORMAL HIGH (ref 70–99)
Glucose-Capillary: 276 mg/dL — ABNORMAL HIGH (ref 70–99)
Glucose-Capillary: 204 mg/dL — ABNORMAL HIGH (ref 70–99)
Glucose-Capillary: 183 mg/dL — ABNORMAL HIGH (ref 70–99)

## 2021-02-21 NOTE — Progress Notes (Signed)
D: Patient is pleasant upon approach. Patient continues to talk to himself while in his room. He is attending some groups with minimal participation. His discharge was discussed during treatment team and patient is not open to going to a group home. Patient hopes to go home to his apartment upon discharge.   A: Continue to monitor medication management and MD orders.  Safety checks completed every 15 minutes per protocol.  Offer support and encouragement as needed.  R: Patient is receptive to staff; his/her behavior is appropriate.     02/21/21 1300  Psych Admission Type (Psych Patients Only)  Admission Status Involuntary  Psychosocial Assessment  Patient Complaints None  Eye Contact Fair  Facial Expression Flat  Affect Appropriate to circumstance  Speech Slow  Interaction Forwards little  Motor Activity Slow  Appearance/Hygiene Disheveled  Behavior Characteristics Cooperative  Mood Preoccupied  Thought Process  Coherency WDL  Content Delusions  Delusions None reported or observed  Perception WDL  Hallucination None reported or observed  Judgment WDL  Confusion None  Danger to Self  Current suicidal ideation? Denies  Danger to Others  Danger to Others None reported or observed

## 2021-02-21 NOTE — [facility] Group Notes (Signed)
.  Psychoeducational Group Note  Date: 02/21/2021 Time: 0900-1000    Goal Setting   Purpose of Group: This group helps to provide patients with the steps of setting a goal that is specific, measurable, attainable, realistic and time specific. A discussion on how we keep ourselves stuck with negative self talk. Homework given for Patients to write 30 positive attributes about themselves.    Participation Level:  Active  Participation Quality:  keeps his eyes closed. Unknown if he is listening or not  Affect:  Appropriate  Cognitive:  Appropriate  Insight:  lacking  Engagement in Group:  Engaged on and off  Additional Comments:  States his true name is [name] and that he is famous. States he doesn't know why he is here except he tried to jump in a garbage can and ended up here.  Dione Housekeeper

## 2021-02-21 NOTE — Progress Notes (Signed)
Adult Psychoeducational Group Note  Date:  02/21/2021 Time:  2:28 AM  Group Topic/Focus:  Wrap-Up Group:   The focus of this group is to help patients review their daily goal of treatment and discuss progress on daily workbooks.  Participation Level:  Did Not Attend  Participation Quality:   Did Not Attend  Affect:   Did Not Attend  Cognitive:   Did Not Attend  Insight: None  Engagement in Group:   Did Not Attend  Modes of Intervention:   Did Not Attend  Additional Comments:  Pt did not attend evening wrap up group tonight.  [name] 02/21/2021, 2:28 AM

## 2021-02-21 NOTE — Progress Notes (Signed)
Adult Psychoeducational Group Note  Date:  02/21/2021 Time:  9:06 PM  Group Topic/Focus:  Wrap-Up Group:   The focus of this group is to help patients review their daily goal of treatment and discuss progress on daily workbooks.  Participation Level:  Active  Participation Quality:  Appropriate  Affect:  Appropriate  Cognitive:  Appropriate  Insight: Appropriate  Engagement in Group:  Engaged  Modes of Intervention:  Discussion  Additional Comments:  Pt stated his goal for today was to focus on his treatment plan. Pt stated he accomplished his goal today. Pt stated he talked with his doctor and social worker about his care today. Pt rated his overall day a 10. Pt stated he was able to contact his daughter today which improved his overall day. Pt stated he felt better about himself today. Pt stated he was able to attend all meals. Pt stated he took all medications provided today. Pt stated he attend all groups held today. Pt stated his appetite was pretty good today. Pt rated sleep last night was pretty good. Pt stated the goal tonight was to get some rest. Pt stated he had no physical pain tonight. Pt deny visual hallucinations and auditory issues tonight. Pt denies thoughts of harming himself or others. Pt stated he would alert staff if anything changed.  Mitchell [name] 02/21/2021, 9:06 PM

## 2021-02-21 NOTE — Group Note (Signed)
  [facility] LCSW Group Therapy Note  Date/Time:  02/21/2021 11:15AM-12:00PM  Type of Therapy and Topic:  Group Therapy:  Feelings About Hospitalization  Participation Level:  Did Not Attend   Description of Group This process group involved patients discussing their feelings related to being hospitalized, as well as the benefits they see to being in the hospital.  These feelings and benefits were itemized.  The group then brainstormed specific ways in which they could seek those same benefits when they discharge and return home.  Therapeutic Goals Patient will identify and describe positive and negative feelings related to hospitalization Patient will verbalize benefits of hospitalization to themselves personally Patients will brainstorm together ways they can obtain similar benefits in the outpatient setting, identify barriers to wellness and possible solutions  Summary of Patient Progress:  The patient did not attend this group. Therapeutic Modalities Cognitive Behavioral Therapy Motivational Interviewing      Aldine Contes, [place] 02/21/2021  12:44 PM

## 2021-02-21 NOTE — Progress Notes (Signed)
Pt was encouraged but didn't attend orientation/goals group. ?

## 2021-02-21 NOTE — Progress Notes (Addendum)
[facility] MD Progress Note  02/21/2021 2:04 PM DAQUIN MAGRO  MRN:  [id number] Subjective:   Mitchell [name] is a 60 year old male with a PPHx of Schizophrenia and multiple hospitalizations presents under IVC for acute psychosis.   Psychiatric Team made the following recommendations yesterday: -Continue Haldol 10 mg BID for psychosis  -Continue Clozapine 50 mg am/50 mg pm.    Case was discussed in the multidisciplinary team. MAR was reviewed and patient was compliant with medications.  He received PRN Trazodone yesterday.    Patient was seen in the room and assessed on the unit.  On interview patient reports that he is "doing fine".  He reports that he is sleeping good.  He reports his appetite is doing good.  He reports no suicidal ideation, homicidal ideation, or auditory visual hallucinations.  Patient reports that he has been feeling more awake during the daytime as noted by him being in the day room rather than still sleeping in bed this morning.  Patient denies any significant side effects from clozapine.  Patient was alert was alert and oriented to time, and place.  Patient insisted his name is Mitchell [name].  When asked who the president was, patient reports that he was the president.  Patient states that he does not want to use the name Mitchell [name] anymore because "there is a warrant out for that name and I do not want to go to prison for using that name".  Patient continues to pleasant and cooperative throughout assessment.    ROS for clozapine was reassessed today: Malaise: denies Chest pain: denies Shortness of breath: denies Exertional capacity: denies Tachycardia: denies Cough: denies Fever: denies Sedation: denies Orthostatic hypotension (dizziness with standing): denies Hypersalivation: denies Constipation: denies Symptoms of GERD: denies Nausea: denies Nocturnal enuresis: denies   Principal Problem: Schizophrenia ([place]) Diagnosis: Principal Problem:   Schizophrenia  ([place]) Active Problems:   Type 2 diabetes mellitus ([place])   Past Psychiatric History: Schizophrenia and multiple hospitalizations  Past Medical History:  Past Medical History:  Diagnosis Date   Bipolar affective disorder ([place])    takes Zyprexa daily   Diabetes mellitus    takes Victoza,Metformin,and Glipizide daily   Hypertension    takes Amlodipine,Lisinopril and Clonidine daily   Hyponatremia    history of   Mental disorder    takes Lithium daily   Schizoaffective disorder    takes Trazodone nightly   Seasonal allergies    takes Claritin daily   Sleep apnea    sleep study >9yrs ago   Stroke [facility])    left arm weakness    Past Surgical History:  Procedure Laterality Date   CATARACT EXTRACTION W/PHACO Right 02/14/2013   Procedure: CATARACT EXTRACTION PHACO AND INTRAOCULAR LENS PLACEMENT ([place]);  Surgeon: Adonis Brook, MD;  Location: [place];  Service: Ophthalmology;  Laterality: Right;   CATARACT EXTRACTION W/PHACO Left 06/13/2013   Procedure: CATARACT EXTRACTION PHACO AND INTRAOCULAR LENS PLACEMENT ([facility]);  Surgeon: Adonis Brook, MD;  Location: [place];  Service: Ophthalmology;  Laterality: Left;   CIRCUMCISION  20 yrs. ago   EYE SURGERY     Family History: History reviewed. No pertinent family history. Family Psychiatric  History: BAPD Social History:  Social History   Substance and Sexual Activity  Alcohol Use Not Currently     Social History   Substance and Sexual Activity  Drug Use No    Social History   Socioeconomic History   Marital status: Divorced    Spouse name: Not on file  Number of children: Not on file   Years of education: Not on file   Highest education level: Not on file  Occupational History   Not on file  Tobacco Use   Smoking status: Never   Smokeless tobacco: Never  Vaping Use   Vaping Use: Never used  Substance and Sexual Activity   Alcohol use: Not Currently   Drug use: No   Sexual activity: Yes    Birth control/protection: None   Other Topics Concern   Not on file  Social History Narrative   Not on file   Social Determinants of Health   Financial Resource Strain: Not on file  Food Insecurity: Not on file  Transportation Needs: Not on file  Physical Activity: Not on file  Stress: Not on file  Social Connections: Not on file   Additional Social History:                         Sleep: Good  Appetite:  Good  Current Medications: Current Facility-Administered Medications  Medication Dose Route Frequency Provider Last Rate Last Admin   acetaminophen (TYLENOL) tablet 650 mg  650 mg Oral Q6H PRN Chalmers Guest, NP       alum & mag hydroxide-simeth (MAALOX/MYLANTA) 200-200-20 MG/5ML suspension 30 mL  30 mL Oral Q4H PRN Chalmers Guest, NP       amLODipine (NORVASC) tablet 10 mg  10 mg Oral Daily Chalmers Guest, NP   10 mg at 02/21/21 [id number]   aspirin EC tablet 81 mg  81 mg Oral Daily Chalmers Guest, NP   81 mg at 02/21/21 0807   atenolol (TENORMIN) tablet 12.5 mg  12.5 mg Oral Daily France Ravens, MD   12.5 mg at 02/21/21 [id number]   benztropine (COGENTIN) tablet 1 mg  1 mg Oral BID Chalmers Guest, NP   1 mg at 02/21/21 [id number]   cloNIDine (CATAPRES) tablet 0.2 mg  0.2 mg Oral BID Tyreesha Maharaj, Ovid Curd, MD   0.2 mg at 02/21/21 [id number]   cloZAPine (CLOZARIL) tablet 50 mg  50 mg Oral BID France Ravens, MD   50 mg at 02/21/21 0807   glipiZIDE (GLUCOTROL XL) 24 hr tablet 10 mg  10 mg Oral Q breakfast Chalmers Guest, NP   10 mg at 02/21/21 0815   haloperidol (HALDOL) tablet 10 mg  10 mg Oral BID France Ravens, MD   10 mg at 02/21/21 0807   insulin aspart (novoLOG) injection 0-15 Units  0-15 Units Subcutaneous TID WC Hill, Jackie Plum, MD   8 Units at 02/21/21 1223   insulin aspart (novoLOG) injection 0-5 Units  0-5 Units Subcutaneous QHS Chalmers Guest, NP   3 Units at 02/20/21 2040   insulin aspart (novoLOG) injection 25 Units  25 Units Subcutaneous TID WC France Ravens, MD   25 Units at 02/21/21 1224   insulin glargine-yfgn  (SEMGLEE) injection 55 Units  55 Units Subcutaneous Daily France Ravens, MD   55 Units at 02/21/21 0811   lisinopril (ZESTRIL) tablet 40 mg  40 mg Oral Daily Chalmers Guest, NP   40 mg at 02/21/21 0806   lithium carbonate (ESKALITH) CR tablet 450 mg  450 mg Oral Q12H Chalmers Guest, NP   450 mg at 02/21/21 0807   magnesium hydroxide (MILK OF MAGNESIA) suspension 30 mL  30 mL Oral Daily PRN Chalmers Guest, NP       OLANZapine zydis (ZYPREXA) disintegrating tablet 10  mg  10 mg Oral Q8H PRN Chalmers Guest, NP   10 mg at 02/03/21 2342   And   ziprasidone (GEODON) injection 20 mg  20 mg Intramuscular PRN Chalmers Guest, NP       polyethylene glycol (MIRALAX / GLYCOLAX) packet 17 g  17 g Oral Daily PRN Ethelene Hal, NP       senna-docusate (Senokot-S) tablet 1 tablet  1 tablet Oral Daily Corky Sox, MD   1 tablet at 02/21/21 [id number]   traZODone (DESYREL) tablet 50 mg  50 mg Oral QHS PRN Chalmers Guest, NP   50 mg at 02/20/21 2039    Lab Results:  Results for orders placed or performed during the hospital encounter of 01/28/21 (from the past 48 hour(s))  Glucose, capillary     Status: Abnormal   Collection Time: 02/19/21  4:59 PM  Result Value Ref Range   Glucose-Capillary 220 (H) 70 - 99 mg/dL    Comment: Glucose reference range applies only to samples taken after fasting for at least 8 hours.  Glucose, capillary     Status: Abnormal   Collection Time: 02/19/21  8:23 PM  Result Value Ref Range   Glucose-Capillary 182 (H) 70 - 99 mg/dL    Comment: Glucose reference range applies only to samples taken after fasting for at least 8 hours.  Glucose, capillary     Status: Abnormal   Collection Time: 02/20/21  5:56 AM  Result Value Ref Range   Glucose-Capillary 143 (H) 70 - 99 mg/dL    Comment: Glucose reference range applies only to samples taken after fasting for at least 8 hours.  Glucose, capillary     Status: Abnormal   Collection Time: 02/20/21 11:55 AM  Result Value Ref Range    Glucose-Capillary 149 (H) 70 - 99 mg/dL    Comment: Glucose reference range applies only to samples taken after fasting for at least 8 hours.  Glucose, capillary     Status: Abnormal   Collection Time: 02/20/21  5:07 PM  Result Value Ref Range   Glucose-Capillary 272 (H) 70 - 99 mg/dL    Comment: Glucose reference range applies only to samples taken after fasting for at least 8 hours.  Glucose, capillary     Status: Abnormal   Collection Time: 02/20/21  8:12 PM  Result Value Ref Range   Glucose-Capillary 300 (H) 70 - 99 mg/dL    Comment: Glucose reference range applies only to samples taken after fasting for at least 8 hours.  Glucose, capillary     Status: Abnormal   Collection Time: 02/21/21  5:37 AM  Result Value Ref Range   Glucose-Capillary 157 (H) 70 - 99 mg/dL    Comment: Glucose reference range applies only to samples taken after fasting for at least 8 hours.  Glucose, capillary     Status: Abnormal   Collection Time: 02/21/21 12:11 PM  Result Value Ref Range   Glucose-Capillary 276 (H) 70 - 99 mg/dL    Comment: Glucose reference range applies only to samples taken after fasting for at least 8 hours.    Blood Alcohol level:  Lab Results  Component Value Date   [facility] <10 01/26/2021   ETH <10 [id number]    Metabolic Disorder Labs: Lab Results  Component Value Date   HGBA1C 11.1 (H) 01/26/2021   MPG 271.87 01/26/2021   MPG 202.99 01/07/2019   No results found for: PROLACTIN Lab Results  Component Value Date   CHOL  140 01/30/2021   TRIG 95 01/30/2021   HDL 49 01/30/2021   CHOLHDL 2.9 01/30/2021   VLDL 19 01/30/2021   LDLCALC 72 01/30/2021   LDLCALC 67 05/20/2020    Physical Findings:   Musculoskeletal: Strength & Muscle Tone: within normal limits Gait & Station: normal Patient leans: N/A  Psychiatric Specialty Exam:  Presentation  General Appearance: Casual Eye Contact:Fair  Speech:Clear and Coherent; Normal Rate  Speech  Volume:Normal  Handedness:Right   Mood and Affect  Mood:Euthymic  Affect:Congruent   Thought Process  Thought Processes:Coherent  Descriptions of Associations:Intact  Orientation:Full (Time, Place and Person)  Thought Content:continues to have grandiose delusions and possible paranoid delusions regarding name  History of Schizophrenia/Schizoaffective disorder:Yes  Duration of Psychotic Symptoms:Greater than six months  Hallucinations:Hallucinations: None Ideas of Reference:None  Suicidal Thoughts:Suicidal Thoughts: No Homicidal Thoughts:Homicidal Thoughts: No  Sensorium  Memory:Immediate Fair; Recent Fair  Judgment:Fair  Insight:Fair   Executive Functions  Concentration:Fair  Attention Span:Fair  Recall:Fair  Fund of Knowledge:Fair  Language:Good   Psychomotor Activity  Psychomotor Activity:Psychomotor Activity: Normal  Assets  Assets:Communication Skills; Resilience   Sleep  Sleep:Sleep: Good Number of Hours of Sleep: 8.25   Physical Exam: Physical Exam Vitals and nursing note reviewed.  Constitutional:      General: He is not in acute distress.    Appearance: Normal appearance. He is obese. He is not ill-appearing or toxic-appearing.  HENT:     Head: Normocephalic and atraumatic.  Pulmonary:     Effort: Pulmonary effort is normal.  Musculoskeletal:        General: Normal range of motion.  Neurological:     General: No focal deficit present.     Mental Status: He is alert.   Review of Systems  Respiratory:  Negative for cough and shortness of breath.   Cardiovascular:  Negative for chest pain.  Gastrointestinal:  Negative for abdominal pain, constipation, diarrhea, nausea and vomiting.  Neurological:  Negative for weakness and headaches.  Psychiatric/Behavioral:  Negative for depression, hallucinations and suicidal ideas. The patient is not nervous/anxious.    Blood pressure 123/81, pulse (!) 105, temperature 98.6 F (37 C),  temperature source Oral, resp. rate 18, height 5' 3.5" (1.613 m), weight 91.6 kg, SpO2 97 %. Body mass index is 35.22 kg/m.   Treatment Plan Summary: Daily contact with patient to assess and evaluate symptoms and progress in treatment and Medication management  Mitchell [name] is a 60 year old male with a PPHx of Schizophrenia and multiple hospitalizations presents under IVC for acute psychosis.   Patient appears less sedated today. Wwe will make referrals to [facility]ACE and HTN on Monday to ensure he has support to safely return to his apartment.  We will not make any medication changes at this time.  We will continue to monitor.    Schizophrenia: -Continue Haldol 10 mg BID -Continue Clozaril 50 mg BID -Continue Lithium 450 mg BID -Continue Senokot-S daily -Continue Cogentin 1 mg BID for EPS prophylaxis -Continue Atenolol 12.5 mg daily -Clozaril labs next due 11/28   HTN: -Continue Lisinopril 40 mg daily -Continue Amlodipine 10 mg daily -Continue Clonidine 0.2 mg BID   Type 2 Diabetes: -Continue glargine 55 units -Continue moderate SSI -Continue QHS correction -Continue 25 units pranidal coverage  -Continue home Glipizide   ABD distention, transaminitis: According staff this has increased since admission. KUB shows non-obstructive gas pattern with no obvious stool burden. ABD exam benign except for protuberant abdomen. ALT persistently elevated to 62. Concern was for ascites despite negative  fluid wave. RUQ [place] negative for hepatic parenchymal disease or biliary disease. For ascites causes, ruled out parenchymal disease of the liver, CHF (BNP WNL), and patient likely does not have cancer. Ammonia WNL. Lipase slightly elevated.  -Hepatitis panel negative   -Continue PRN's: Tylenol, Maalox, Atarax, Milk of Magnesia, Trazodone   France Ravens, MD 02/21/2021, 2:04 PM  Total Time Spent in Direct Patient Care:  I personally spent 20 minutes on the unit in direct patient care. The direct  patient care time included face-to-face time with the patient, reviewing the patient's chart, communicating with other professionals, and coordinating care. Greater than 50% of this time was spent in counseling or coordinating care with the patient regarding goals of hospitalization, psycho-education, and discharge planning needs.  I have independently evaluated the patient during a face-to-face assessment on 02/21/21. I reviewed the patient's chart, and I participated in key portions of the service. I discussed the case with the [employer], and I agree with the assessment and plan of care as documented in the [employer] note, as addended by me or notated below:  I agree with the note and plan. Patient continues to have an improvement.  We will not adjust medications anymore at this time, unless there is reason to do so between now and when discharge planning can be completed. The plan is for the patient to be discharged to his apartment, with wraparound services for medication management and compliance.  Discharge will occur when these services are set up.  Janine Limbo, MD Psychiatrist

## 2021-02-22 DIAGNOSIS — F200 Paranoid schizophrenia: Secondary | ICD-10-CM | POA: Diagnosis not present

## 2021-02-22 LAB — GLUCOSE, CAPILLARY
Glucose-Capillary: 164 mg/dL — ABNORMAL HIGH (ref 70–99)
Glucose-Capillary: 242 mg/dL — ABNORMAL HIGH (ref 70–99)
Glucose-Capillary: 252 mg/dL — ABNORMAL HIGH (ref 70–99)
Glucose-Capillary: 215 mg/dL — ABNORMAL HIGH (ref 70–99)

## 2021-02-22 NOTE — Progress Notes (Signed)
   On assessment, pt presents calm and cooperative.  Pt states, he will discharge "middle of next week."  Patient denies SI/HI and verbally contracts for safety.  Pt denies AVH.  Administered PRN Trazodone per MAR per pt request.  Pt is safe on unit with Q 15 minute safety checks.    02/21/21 2103  Psych Admission Type (Psych Patients Only)  Admission Status Involuntary  Psychosocial Assessment  Patient Complaints None  Eye Contact Fair  Facial Expression Flat  Affect Appropriate to circumstance  Speech Slow  Interaction Forwards little  Motor Activity Slow  Appearance/Hygiene Disheveled  Behavior Characteristics Cooperative  Mood Pleasant;Preoccupied  Thought Process  Coherency WDL  Content Delusions  Delusions None reported or observed  Perception WDL  Hallucination None reported or observed  Judgment WDL  Confusion None  Danger to Self  Current suicidal ideation? Denies  Danger to Others  Danger to Others None reported or observed

## 2021-02-22 NOTE — Progress Notes (Signed)
[facility] MD Progress Note  02/22/2021 11:16 AM Mitchell [name]  MRN:  [id number] Subjective:   Mitchell [name] is a 60 year old male with a PPHx of Schizophrenia and multiple hospitalizations presents under IVC for acute psychosis.   Psychiatric Team made the following recommendations yesterday: -Continue Haldol 10 mg BID for psychosis  -Continue Clozapine 50 mg am/50 mg pm.    Case was discussed in the multidisciplinary team. MAR was reviewed and patient was compliant with medications.  He received PRN Trazodone yesterday.  Patient was seen in the room and assessed on the unit.  On interview patient reports that he is "doing great".  He reports that he is sleeping good.  He reports his appetite is doing good.  He reports no suicidal ideation, homicidal ideation, or auditory visual hallucinations.  Patient has been sleeping more since being in the hospital the patient does not report feeling more sedated during the daytime.  Patient denies any significant side effects from clozapine.  Patient was alert was alert and oriented to time, and place.  Patient insisted his name is Mitchell [name].  When asked who the president was, patient reports that he was the president.  Patient continues to pleasant and cooperative throughout assessment.  ROS for clozapine was reassessed today: Malaise: denies Chest pain: denies Shortness of breath: denies Exertional capacity: denies Tachycardia: denies Cough: denies Fever: denies Sedation: denies Orthostatic hypotension (dizziness with standing): denies Hypersalivation: denies Constipation: denies Symptoms of GERD: denies Nausea: denies Nocturnal enuresis: denies   Principal Problem: Schizophrenia ([place]) Diagnosis: Principal Problem:   Schizophrenia ([place]) Active Problems:   Type 2 diabetes mellitus ([place])   Past Psychiatric History: Schizophrenia and multiple hospitalizations  Past Medical History:  Past Medical History:  Diagnosis Date   Bipolar affective  disorder ([place])    takes Zyprexa daily   Diabetes mellitus    takes Victoza,Metformin,and Glipizide daily   Hypertension    takes Amlodipine,Lisinopril and Clonidine daily   Hyponatremia    history of   Mental disorder    takes Lithium daily   Schizoaffective disorder    takes Trazodone nightly   Seasonal allergies    takes Claritin daily   Sleep apnea    sleep study >34yrs ago   Stroke [facility])    left arm weakness    Past Surgical History:  Procedure Laterality Date   CATARACT EXTRACTION W/PHACO Right 02/14/2013   Procedure: CATARACT EXTRACTION PHACO AND INTRAOCULAR LENS PLACEMENT ([place]);  Surgeon: Adonis Brook, MD;  Location: [place];  Service: Ophthalmology;  Laterality: Right;   CATARACT EXTRACTION W/PHACO Left 06/13/2013   Procedure: CATARACT EXTRACTION PHACO AND INTRAOCULAR LENS PLACEMENT ([facility]);  Surgeon: Adonis Brook, MD;  Location: [place];  Service: Ophthalmology;  Laterality: Left;   CIRCUMCISION  20 yrs. ago   EYE SURGERY     Family History: History reviewed. No pertinent family history. Family Psychiatric  History: BAPD Social History:  Social History   Substance and Sexual Activity  Alcohol Use Not Currently     Social History   Substance and Sexual Activity  Drug Use No    Social History   Socioeconomic History   Marital status: Divorced    Spouse name: Not on file   Number of children: Not on file   Years of education: Not on file   Highest education level: Not on file  Occupational History   Not on file  Tobacco Use   Smoking status: Never   Smokeless tobacco: Never  Vaping Use  Vaping Use: Never used  Substance and Sexual Activity   Alcohol use: Not Currently   Drug use: No   Sexual activity: Yes    Birth control/protection: None  Other Topics Concern   Not on file  Social History Narrative   Not on file   Social Determinants of Health   Financial Resource Strain: Not on file  Food Insecurity: Not on file  Transportation Needs: Not on file   Physical Activity: Not on file  Stress: Not on file  Social Connections: Not on file   Additional Social History:                         Sleep: Good  Appetite:  Good  Current Medications: Current Facility-Administered Medications  Medication Dose Route Frequency Provider Last Rate Last Admin   acetaminophen (TYLENOL) tablet 650 mg  650 mg Oral Q6H PRN Chalmers Guest, NP       alum & mag hydroxide-simeth (MAALOX/MYLANTA) 200-200-20 MG/5ML suspension 30 mL  30 mL Oral Q4H PRN Chalmers Guest, NP       amLODipine (NORVASC) tablet 10 mg  10 mg Oral Daily Chalmers Guest, NP   10 mg at 02/22/21 [id number]   aspirin EC tablet 81 mg  81 mg Oral Daily Chalmers Guest, NP   81 mg at 02/22/21 0738   atenolol (TENORMIN) tablet 12.5 mg  12.5 mg Oral Daily France Ravens, MD   12.5 mg at 02/21/21 [id number]   benztropine (COGENTIN) tablet 1 mg  1 mg Oral BID Chalmers Guest, NP   1 mg at 02/22/21 [id number]   cloNIDine (CATAPRES) tablet 0.2 mg  0.2 mg Oral BID Massengill, Ovid Curd, MD   0.2 mg at 02/22/21 0740   cloZAPine (CLOZARIL) tablet 50 mg  50 mg Oral BID France Ravens, MD   50 mg at 02/22/21 0738   glipiZIDE (GLUCOTROL XL) 24 hr tablet 10 mg  10 mg Oral Q breakfast Chalmers Guest, NP   10 mg at 02/22/21 [id number]   haloperidol (HALDOL) tablet 10 mg  10 mg Oral BID France Ravens, MD   10 mg at 02/22/21 0738   insulin aspart (novoLOG) injection 0-15 Units  0-15 Units Subcutaneous TID WC Hill, Jackie Plum, MD   3 Units at 02/22/21 [id number]   insulin aspart (novoLOG) injection 0-5 Units  0-5 Units Subcutaneous QHS Chalmers Guest, NP   3 Units at 02/20/21 2040   insulin aspart (novoLOG) injection 25 Units  25 Units Subcutaneous TID WC France Ravens, MD   25 Units at 02/22/21 0634   insulin glargine-yfgn (SEMGLEE) injection 55 Units  55 Units Subcutaneous Daily France Ravens, MD   55 Units at 02/22/21 0732   lisinopril (ZESTRIL) tablet 40 mg  40 mg Oral Daily Chalmers Guest, NP   40 mg at 02/22/21 0738   lithium carbonate  (ESKALITH) CR tablet 450 mg  450 mg Oral Q12H Chalmers Guest, NP   450 mg at 02/22/21 0739   magnesium hydroxide (MILK OF MAGNESIA) suspension 30 mL  30 mL Oral Daily PRN Chalmers Guest, NP       OLANZapine zydis (ZYPREXA) disintegrating tablet 10 mg  10 mg Oral Q8H PRN Chalmers Guest, NP   10 mg at 02/03/21 2342   And   ziprasidone (GEODON) injection 20 mg  20 mg Intramuscular PRN Chalmers Guest, NP       polyethylene glycol (MIRALAX / GLYCOLAX)  packet 17 g  17 g Oral Daily PRN Ethelene Hal, NP       senna-docusate (Senokot-S) tablet 1 tablet  1 tablet Oral Daily Corky Sox, MD   1 tablet at 02/22/21 [id number]   traZODone (DESYREL) tablet 50 mg  50 mg Oral QHS PRN Chalmers Guest, NP   50 mg at 02/21/21 2103    Lab Results:  Results for orders placed or performed during the hospital encounter of 01/28/21 (from the past 48 hour(s))  Glucose, capillary     Status: Abnormal   Collection Time: 02/20/21 11:55 AM  Result Value Ref Range   Glucose-Capillary 149 (H) 70 - 99 mg/dL    Comment: Glucose reference range applies only to samples taken after fasting for at least 8 hours.  Glucose, capillary     Status: Abnormal   Collection Time: 02/20/21  5:07 PM  Result Value Ref Range   Glucose-Capillary 272 (H) 70 - 99 mg/dL    Comment: Glucose reference range applies only to samples taken after fasting for at least 8 hours.  Glucose, capillary     Status: Abnormal   Collection Time: 02/20/21  8:12 PM  Result Value Ref Range   Glucose-Capillary 300 (H) 70 - 99 mg/dL    Comment: Glucose reference range applies only to samples taken after fasting for at least 8 hours.  Glucose, capillary     Status: Abnormal   Collection Time: 02/21/21  5:37 AM  Result Value Ref Range   Glucose-Capillary 157 (H) 70 - 99 mg/dL    Comment: Glucose reference range applies only to samples taken after fasting for at least 8 hours.  Glucose, capillary     Status: Abnormal   Collection Time: 02/21/21 12:11 PM   Result Value Ref Range   Glucose-Capillary 276 (H) 70 - 99 mg/dL    Comment: Glucose reference range applies only to samples taken after fasting for at least 8 hours.  Glucose, capillary     Status: Abnormal   Collection Time: 02/21/21  4:57 PM  Result Value Ref Range   Glucose-Capillary 204 (H) 70 - 99 mg/dL    Comment: Glucose reference range applies only to samples taken after fasting for at least 8 hours.  Glucose, capillary     Status: Abnormal   Collection Time: 02/21/21  7:41 PM  Result Value Ref Range   Glucose-Capillary 183 (H) 70 - 99 mg/dL    Comment: Glucose reference range applies only to samples taken after fasting for at least 8 hours.  Glucose, capillary     Status: Abnormal   Collection Time: 02/22/21  5:47 AM  Result Value Ref Range   Glucose-Capillary 164 (H) 70 - 99 mg/dL    Comment: Glucose reference range applies only to samples taken after fasting for at least 8 hours.    Blood Alcohol level:  Lab Results  Component Value Date   ETH <10 01/26/2021   ETH <10 [id number]    Metabolic Disorder Labs: Lab Results  Component Value Date   HGBA1C 11.1 (H) 01/26/2021   MPG 271.87 01/26/2021   MPG 202.99 01/07/2019   No results found for: PROLACTIN Lab Results  Component Value Date   CHOL 140 01/30/2021   TRIG 95 01/30/2021   HDL 49 01/30/2021   CHOLHDL 2.9 01/30/2021   VLDL 19 01/30/2021   LDLCALC 72 01/30/2021   LDLCALC 67 05/20/2020    Physical Findings:   Musculoskeletal: Strength & Muscle Tone: within normal limits Gait &  Station: normal Patient leans: N/A  Psychiatric Specialty Exam:  Presentation  General Appearance: Casual Eye Contact:Fair  Speech:Clear and Coherent; Normal Rate  Speech Volume:Normal  Handedness:Right   Mood and Affect  Mood:Euthymic  Affect:Congruent   Thought Process  Thought Processes:Coherent  Descriptions of Associations:Intact  Orientation:Full (Time, Place and Person)  Thought  Content:continues to have grandiose delusions and possible paranoid delusions regarding name  History of Schizophrenia/Schizoaffective disorder:Yes  Duration of Psychotic Symptoms:Greater than six months  Hallucinations: none Ideas of Reference:None  Suicidal Thoughts: none Homicidal Thoughts:none  Sensorium  Memory:Immediate Fair; Recent Fair  Judgment:Fair  Insight:Fair   Executive Functions  Concentration:Fair  Attention Span:Fair  [place]  Language:Good   Psychomotor Activity  Psychomotor Activity: normal  Assets  Assets:Communication Skills; Resilience   Sleep  Sleep:No data recorded   Physical Exam: Physical Exam Vitals and nursing note reviewed.  Constitutional:      General: He is not in acute distress.    Appearance: Normal appearance. He is obese. He is not ill-appearing or toxic-appearing.  HENT:     Head: Normocephalic and atraumatic.  Pulmonary:     Effort: Pulmonary effort is normal.  Musculoskeletal:        General: Normal range of motion.  Neurological:     General: No focal deficit present.     Mental Status: He is alert.   Review of Systems  Respiratory:  Negative for cough and shortness of breath.   Cardiovascular:  Negative for chest pain.  Gastrointestinal:  Negative for abdominal pain, constipation, diarrhea, nausea and vomiting.  Neurological:  Negative for weakness and headaches.  Psychiatric/Behavioral:  Negative for depression, hallucinations and suicidal ideas. The patient is not nervous/anxious.    Blood pressure 122/76, pulse 100, temperature 97.6 F (36.4 C), temperature source Oral, resp. rate 18, height 5' 3.5" (1.613 m), weight 91.6 kg, SpO2 98 %. Body mass index is 35.22 kg/m.   Treatment Plan Summary: Daily contact with patient to assess and evaluate symptoms and progress in treatment and Medication management  Brylan Lyng is a 60 year old male with a PPHx of Schizophrenia and  multiple hospitalizations presents under IVC for acute psychosis.   Patient appears less sedated today. We will make referrals to PACE and HTN on Monday to ensure he has support to safely return to his apartment.  Otherwise, we will need to consider recommending group home given patient's diabetes and CBG ranging from 150s to 400s we will not make any medication changes at this time.  We will continue to monitor.    Schizophrenia: -Continue Haldol 10 mg BID -Continue Clozaril 50 mg BID -Continue Lithium 450 mg BID -Continue Senokot-S daily -Continue Cogentin 1 mg BID for EPS prophylaxis -Continue Atenolol 12.5 mg daily -Clozaril labs next due 11/28   HTN: -Continue Lisinopril 40 mg daily -Continue Amlodipine 10 mg daily -Continue Clonidine 0.2 mg BID   Type 2 Diabetes: -Continue glargine 55 units -Continue moderate SSI -Continue QHS correction -Continue 25 units pranidal coverage  -Continue home Glipizide   ABD distention, transaminitis: According staff this has increased since admission. KUB shows non-obstructive gas pattern with no obvious stool burden. ABD exam benign except for protuberant abdomen. ALT persistently elevated to 62. Concern was for ascites despite negative fluid wave. RUQ [place] negative for hepatic parenchymal disease or biliary disease. For ascites causes, ruled out parenchymal disease of the liver, CHF (BNP WNL), and patient likely does not have cancer. Ammonia WNL. Lipase slightly elevated.  -Hepatitis panel  negative   -Continue PRN's: Tylenol, Maalox, Atarax, Milk of Magnesia, Trazodone   Park Pope, MD 02/22/2021, 11:16 AM
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
## 2021-02-22 NOTE — Plan of Care (Signed)
  Problem: Coping: Goal: Ability to identify and develop effective coping behavior will improve Outcome: Progressing   Problem: Self-Concept: Goal: Level of anxiety will decrease Outcome: Progressing   Problem: Education: Goal: Emotional status will improve Outcome: Progressing   

## 2021-02-22 NOTE — Progress Notes (Signed)
   02/22/21 0738  Psych Admission Type (Psych Patients Only)  Admission Status Involuntary  Psychosocial Assessment  Patient Complaints None  Eye Contact Fair  Facial Expression Flat  Affect Appropriate to circumstance  Speech Slow  Interaction Forwards little  Motor Activity Slow  Appearance/Hygiene Disheveled  Behavior Characteristics Cooperative  Mood Pleasant  Thought Process  Coherency WDL  Content WDL  Delusions None reported or observed  Perception WDL  Hallucination None reported or observed  Judgment WDL  Confusion None  Danger to Self  Current suicidal ideation? Denies  Danger to Others  Danger to Others None reported or observed   D: Patient denies SI/HI/AVH.  Patient denies both anxiety and depression. Pt. Attended groups, but isolated in his room in between.  A:  Patient took scheduled medicine.  Support and encouragement provided Routine safety checks conducted every 15 minutes. Patient  Informed to notify staff with any concerns.   R:  Safety maintained.

## 2021-02-22 NOTE — Progress Notes (Signed)
Pt was encouraged but didn't attend orientation/goals group. ?

## 2021-02-22 NOTE — Group Note (Signed)
[facility] LCSW Group Therapy Note  Date/Time:  02/22/2021  11:00AM-12:00PM  Type of Therapy and Topic:  Group Therapy:  Music and Mood  Participation Level:  Minimal   Description of Group:    Christian Cagle, LCSWA, led group.   In this process group, members listened to a variety of genres of music and identified that different types of music evoke different responses.  Patients were encouraged to identify music that was soothing for them and music that was energizing for them.  Patients discussed how this knowledge can help with wellness and recovery in various ways including managing depression and anxiety as well as encouraging healthy sleep habits.    Therapeutic Goals: Patients will explore the impact of different varieties of music on mood Patients will verbalize the thoughts they have when listening to different types of music Patients will identify music that is soothing to them as well as music that is energizing to them Patients will discuss how to use this knowledge to assist in maintaining wellness and recovery Patients will explore the use of music as a coping skill  Summary of Patient Progress:  At the beginning of group, patient expressed his mood was "A-1."  He dozed through most of group.  At the end of group, patient expressed his mood was still very good.    Therapeutic Modalities: Solution Focused Brief Therapy Activity   Ambrose Mantle, LCSW

## 2021-02-22 NOTE — [facility] Group Notes (Signed)
[facility] Group Notes:  (Nursing/MHT/Case Management/Adjunct)  Date:  02/22/2021  Time:  4:58 PM  Type of Therapy:  Psychoeducational Skills  Participation Level:  Active  Participation Quality:  Attentive  Affect:  Appropriate  Cognitive:  Appropriate  Insight:  Appropriate  Engagement in Group:  Engaged and Improving  Modes of Intervention:  Activity, Discussion, Education, and Support  Summary of Progress/Problems: Group was consisted of reading an article called "yesterday, Today and Tomorrow" and group discussion and education focused on how to only focus on things that are in your control. Not to worry about yesterday or the tomorrow but focus on the present and to just learn to live in the moment.  Marzell Allemand J Amarie Viles 02/22/2021, 4:58 PM

## 2021-02-23 ENCOUNTER — Encounter (HOSPITAL_COMMUNITY): Payer: Self-pay

## 2021-02-23 LAB — COMPREHENSIVE METABOLIC PANEL
ALT: 71 U/L — ABNORMAL HIGH (ref 0–44)
AST: 45 U/L — ABNORMAL HIGH (ref 15–41)
Albumin: 3.8 g/dL (ref 3.5–5.0)
Alkaline Phosphatase: 159 U/L — ABNORMAL HIGH (ref 38–126)
Anion gap: 7 (ref 5–15)
BUN: 20 mg/dL (ref 6–20)
CO2: 26 mmol/L (ref 22–32)
Calcium: 9.4 mg/dL (ref 8.9–10.3)
Chloride: 102 mmol/L (ref 98–111)
Creatinine, Ser: 1.1 mg/dL (ref 0.61–1.24)
GFR, Estimated: >60 mL/min (ref >60)
Glucose, Bld: 213 mg/dL — ABNORMAL HIGH (ref 70–99)
Potassium: 4.5 mmol/L (ref 3.5–5.1)
Sodium: 135 mmol/L (ref 135–145)
Total Bilirubin: 0.6 mg/dL (ref 0.3–1.2)
Total Protein: 7.6 g/dL (ref 6.5–8.1)

## 2021-02-23 LAB — GLUCOSE, CAPILLARY
Glucose-Capillary: 223 mg/dL — ABNORMAL HIGH (ref 70–99)
Glucose-Capillary: 216 mg/dL — ABNORMAL HIGH (ref 70–99)
Glucose-Capillary: 149 mg/dL — ABNORMAL HIGH (ref 70–99)
Glucose-Capillary: 145 mg/dL — ABNORMAL HIGH (ref 70–99)

## 2021-02-23 LAB — CBC WITH DIFFERENTIAL/PLATELET
Abs Immature Granulocytes: 0.08 10*3/uL — ABNORMAL HIGH (ref 0.00–0.07)
Basophils Absolute: 0.1 10*3/uL (ref 0.0–0.1)
Basophils Relative: 1 %
Eosinophils Absolute: 0.4 10*3/uL (ref 0.0–0.5)
Eosinophils Relative: 5 %
HCT: 41.3 % (ref 39.0–52.0)
Hemoglobin: 13.5 g/dL (ref 13.0–17.0)
Immature Granulocytes: 1 %
Lymphocytes Relative: 35 %
Lymphs Abs: 2.4 10*3/uL (ref 0.7–4.0)
MCH: 30.7 pg (ref 26.0–34.0)
MCHC: 32.7 g/dL (ref 30.0–36.0)
MCV: 93.9 fL (ref 80.0–100.0)
Monocytes Absolute: 0.6 10*3/uL (ref 0.1–1.0)
Monocytes Relative: 8 %
Neutro Abs: 3.4 10*3/uL (ref 1.7–7.7)
Neutrophils Relative %: 50 %
Platelets: 260 10*3/uL (ref 150–400)
RBC: 4.4 MIL/uL (ref 4.22–5.81)
RDW: 12.5 % (ref 11.5–15.5)
WBC: 6.8 10*3/uL (ref 4.0–10.5)
nRBC: 0 % (ref 0.0–0.2)

## 2021-02-23 LAB — TROPONIN I (HIGH SENSITIVITY): Troponin I (High Sensitivity): 3 ng/L (ref <18)

## 2021-02-23 LAB — C-REACTIVE PROTEIN: CRP: 2.1 mg/dL — ABNORMAL HIGH (ref <1.0)

## 2021-02-23 LAB — LITHIUM LEVEL: Lithium Lvl: 0.7 mmol/L (ref 0.60–1.20)

## 2021-02-23 MED ORDER — INSULIN ASPART 100 UNIT/ML IJ SOLN
30.0000 [IU] | Freq: Three times a day (TID) | INTRAMUSCULAR | Status: DC
  Administered 2021-02-23 – 2021-02-24 (×3): 30 [IU] via SUBCUTANEOUS

## 2021-02-23 MED ORDER — TRAZODONE HCL 50 MG PO TABS
50.0000 mg | ORAL_TABLET | Freq: Every day | ORAL | Status: DC
  Administered 2021-02-23 – 2021-03-01 (×7): 50 mg via ORAL
  Filled 2021-02-23 (×12): qty 1

## 2021-02-23 NOTE — [facility] Group Notes (Signed)
[facility] Group Notes:  (Nursing/MHT/Case Management/Adjunct)  Date:  02/23/2021  Time:  6:30 PM  Type of Therapy:   Therapeutic Relaxation group  Participation Level:  Active  Participation Quality:  Appropriate  Affect:  Appropriate  Cognitive:  Appropriate  Insight:  Good and Improving  Engagement in Group:  Engaged  Modes of Intervention:  Activity, Discussion, and Support  Summary of Progress/Problems: Group consisted of deep breathing and deep stretching along with saying aloud three positive affirmations about oneself.   Micholas Drumwright J Beretta Ginsberg 02/23/2021, 6:30 PM

## 2021-02-23 NOTE — Progress Notes (Signed)
DAR NOTE: Patient presents with a calm affect and pleasant mood.  Denies suicidal thoughts, auditory and visual hallucinations.  Rates depression at 0, hopelessness at 0, and anxiety at 0.  Maintained on routine safety checks.  Medications given as prescribed.  Support and encouragement offered as needed.  Attended group and participated.  States goal for today is "discharge."  Patient observed socializing with peers in the dayroom.  Offered no complaint.  Patient is safe on and off the unit.

## 2021-02-23 NOTE — Group Note (Signed)
Recreation Therapy Group Note   Group Topic:Health and Wellness  Group Date: 02/23/2021 Start Time: 1000 End Time: 1100 Facilitators: Caroll Rancher, LRT,CTRS Location: [address] Dayroom   Goal Area(s) Addresses:  Patient will define components of whole wellness. Patient will verbalize benefit of whole wellness.  Group Description:  LRT and patients discussed the components of wellness (mental, physical and spiritual) and why they are important.  LRT and patients completed interventions that corresponded to each of the components.  In the first section, patients took turns leading the group in exercises of their choosing to show how the physical in important.  Second, patients did meditation to show how it helps to relax and clear the mind and lastly, patients were able to request songs to be played as a form of therapy that can bring all three elements together at once.   Affect/Mood: Drowsy   Participation Level: None   Participation Quality: None   Behavior: Withdrawn   Speech/Thought Process: None   Insight: None   Judgement: None   Modes of Intervention: Activity and Music   Patient Response to Interventions:  None   Education Outcome:  Acknowledges education and In group clarification offered    Clinical Observations/Individualized Feedback: Pt came near the end of group.  Pt sat by the window and was sleeping.  Pt left group when called out by the nurse.    Plan: Continue to engage patient in RT group sessions 2-3x/week.   Caroll Rancher, LRT,CTRS 02/23/2021 2:13 PM

## 2021-02-23 NOTE — Progress Notes (Signed)
   02/23/21 2000  Psych Admission Type (Psych Patients Only)  Admission Status Involuntary  Psychosocial Assessment  Patient Complaints None  Eye Contact Fair  Facial Expression Flat  Affect Appropriate to circumstance  Speech Slow  Interaction Forwards little  Motor Activity Slow  Appearance/Hygiene Disheveled  Behavior Characteristics Cooperative  Mood Pleasant  Thought Process  Coherency WDL  Content WDL  Delusions None reported or observed  Perception WDL  Hallucination None reported or observed  Judgment WDL  Confusion None  Danger to Self  Current suicidal ideation? Denies  Danger to Others  Danger to Others None reported or observed

## 2021-02-23 NOTE — Progress Notes (Signed)
Inpatient Diabetes Program Recommendations  AACE/ADA: New Consensus Statement on Inpatient Glycemic Control (2015)  Target Ranges:  Prepandial:   less than 140 mg/dL      Peak postprandial:   less than 180 mg/dL (1-2 hours)      Critically ill patients:  140 - 180 mg/dL     Latest Reference Range & Units 02/22/21 05:47 02/22/21 11:58 02/22/21 17:08 02/22/21 21:00 02/23/21 06:22  Glucose-Capillary 70 - 99 mg/dL 786 (H) 754 (H) 492 (H) 215 (H) 223 (H)    Review of Glycemic Control  Current Orders: Semglee 55 units daily Novolog 0-15 units TID ac/hs  Novolog 25 units TID with meals Glipizide 10 mg daily    MD- Please consider:  1.  Increase Novolog Meal Coverage to 30 units TID with meals  --Will follow patient during hospitalization--  Christena Deem RN, MSN, BC-ADM Inpatient Diabetes Coordinator Team Pager [phone] (8a-5p)

## 2021-02-23 NOTE — Group Note (Signed)
LCSW Group Therapy Note  Group Date: 02/23/2021 Start Time: 1300 End Time: 1400   Type of Therapy and Topic:  Group Therapy: Anger Cues and Responses  Participation Level:  Active   Description of Group:   In this group, patients learned how to recognize the physical, cognitive, emotional, and behavioral responses they have to anger-provoking situations.  They identified a recent time they became angry and how they reacted.  They analyzed how their reaction was possibly beneficial and how it was possibly unhelpful.  The group discussed a variety of healthier coping skills that could help with such a situation in the future.  Focus was placed on how helpful it is to recognize the underlying emotions to our anger, because working on those can lead to a more permanent solution as well as our ability to focus on the important rather than the urgent.  Therapeutic Goals: Patients will remember their last incident of anger and how they felt emotionally and physically, what their thoughts were at the time, and how they behaved. Patients will identify how their behavior at that time worked for them, as well as how it worked against them. Patients will explore possible new behaviors to use in future anger situations. Patients will learn that anger itself is normal and cannot be eliminated, and that healthier reactions can assist with resolving conflict rather than worsening situations.  Summary of Patient Progress:  Mitchell [name] was active during the group. He shared a recent occurrence wherein feeling frustrated led to anger. Patient expressed anger when people don't call him [name]. Patient was disorganized at times but could be redirected. He demonstrated fair insight into the subject matter, was respectful of peers, and participated throughout the entire session.  Therapeutic Modalities:   Cognitive Behavioral Therapy    Beatris Si, LCSWA 02/23/2021  1:47 PM

## 2021-02-23 NOTE — Progress Notes (Signed)
Patient denies SI/HI/AVH. Patient denies both anxiety and depression. He was visible in the milieu during the evening. He was compliant with medications on shift. No new behavioral issues to report on shift at this time.

## 2021-02-23 NOTE — BH IP Treatment Plan (Signed)
Interdisciplinary Treatment and Diagnostic Plan Update  02/23/2021 Mitchell [name] MRN: [id number]  Principal Diagnosis: Schizophrenia [facility])  Secondary Diagnoses: Principal Problem:   Schizophrenia (HCC) Active Problems:   Type 2 diabetes mellitus (HCC)   Current Medications:  Current Facility-Administered Medications  Medication Dose Route Frequency Provider Last Rate Last Admin   acetaminophen (TYLENOL) tablet 650 mg  650 mg Oral Q6H PRN Novella Olive, NP       alum & mag hydroxide-simeth (MAALOX/MYLANTA) 200-200-20 MG/5ML suspension 30 mL  30 mL Oral Q4H PRN Novella Olive, NP       amLODipine (NORVASC) tablet 10 mg  10 mg Oral Daily Novella Olive, NP   10 mg at 02/23/21 0846   aspirin EC tablet 81 mg  81 mg Oral Daily Novella Olive, NP   81 mg at 02/23/21 0845   atenolol (TENORMIN) tablet 12.5 mg  12.5 mg Oral Daily Park Pope, MD   12.5 mg at 02/23/21 0845   benztropine (COGENTIN) tablet 1 mg  1 mg Oral BID Novella Olive, NP   1 mg at 02/23/21 0846   cloNIDine (CATAPRES) tablet 0.2 mg  0.2 mg Oral BID Massengill, Harrold Donath, MD   0.2 mg at 02/23/21 0846   cloZAPine (CLOZARIL) tablet 50 mg  50 mg Oral BID Park Pope, MD   50 mg at 02/23/21 0846   glipiZIDE (GLUCOTROL XL) 24 hr tablet 10 mg  10 mg Oral Q breakfast Novella Olive, NP   10 mg at 02/23/21 0845   haloperidol (HALDOL) tablet 10 mg  10 mg Oral BID Park Pope, MD   10 mg at 02/23/21 0846   insulin aspart (novoLOG) injection 0-15 Units  0-15 Units Subcutaneous TID WC Hill, Shelbie Hutching, MD   5 Units at 02/23/21 0847   insulin aspart (novoLOG) injection 0-5 Units  0-5 Units Subcutaneous QHS Novella Olive, NP   2 Units at 02/22/21 2134   insulin aspart (novoLOG) injection 30 Units  30 Units Subcutaneous TID WC Park Pope, MD       insulin glargine-yfgn [facility]) injection 55 Units  55 Units Subcutaneous Daily Park Pope, MD   55 Units at 02/23/21 0847   lisinopril (ZESTRIL) tablet 40 mg  40 mg Oral Daily Novella Olive, NP    40 mg at 02/23/21 0845   lithium carbonate (ESKALITH) CR tablet 450 mg  450 mg Oral Q12H Novella Olive, NP   450 mg at 02/23/21 0846   magnesium hydroxide (MILK OF MAGNESIA) suspension 30 mL  30 mL Oral Daily PRN Novella Olive, NP       OLANZapine zydis (ZYPREXA) disintegrating tablet 10 mg  10 mg Oral Q8H PRN Novella Olive, NP   10 mg at 02/03/21 2342   And   ziprasidone (GEODON) injection 20 mg  20 mg Intramuscular PRN Novella Olive, NP       polyethylene glycol (MIRALAX / GLYCOLAX) packet 17 g  17 g Oral Daily PRN Laveda Abbe, NP       senna-docusate (Senokot-S) tablet 1 tablet  1 tablet Oral Daily Carlyn Reichert, MD   1 tablet at 02/23/21 0846   traZODone (DESYREL) tablet 50 mg  50 mg Oral QHS PRN Novella Olive, NP   50 mg at 02/22/21 2132   PTA Medications: Medications Prior to Admission  Medication Sig Dispense Refill Last Dose   amLODipine (NORVASC) 10 MG tablet Take 1 tablet (10 mg total) by mouth  daily. (Patient not taking: Reported on 01/26/2021) 90 tablet 1    aspirin 81 MG EC tablet Take 1 tablet (81 mg total) by mouth daily. Swallow whole. 30 tablet 12    benztropine (COGENTIN) 1 MG tablet Take 1 tablet (1 mg total) by mouth 2 (two) times daily. (Patient not taking: Reported on 01/26/2021) 60 tablet 2    cloNIDine (CATAPRES) 0.2 MG tablet Take 0.2 mg by mouth 2 (two) times daily. (Patient not taking: Reported on 01/26/2021)      fluticasone (FLONASE) 50 MCG/ACT nasal spray Place 1 spray into both nostrils daily as needed for allergies.  (Patient not taking: Reported on 01/26/2021)      glipiZIDE (GLUCOTROL XL) 10 MG 24 hr tablet Take 1 tablet (10 mg total) by mouth daily with breakfast. 90 tablet 1    haloperidol (HALDOL) 20 MG tablet Take 1 tablet (20 mg total) by mouth at bedtime. (Patient not taking: Reported on 01/26/2021) 90 tablet 1    insulin glargine (LANTUS) 100 UNIT/ML injection Inject 10 Units into the skin daily.      insulin lispro (HUMALOG) 100 UNIT/ML  KwikPen Inject 2-10 Units into the skin 3 (three) times daily. Per sliding scale (Patient not taking: Reported on 01/26/2021)      lactulose (CHRONULAC) 10 GM/15ML solution Take 30 mLs by mouth daily as needed for mild constipation.  (Patient not taking: Reported on 01/26/2021)      LEVEMIR FLEXTOUCH 100 UNIT/ML FlexPen Inject 20 Units into the skin at bedtime. (Patient not taking: Reported on 01/26/2021)      liraglutide (VICTOZA) 18 MG/3ML SOPN Inject 1.8 mg into the skin daily. (Patient not taking: Reported on 01/26/2021)      lisinopril (ZESTRIL) 40 MG tablet Take 1 tablet (40 mg total) by mouth daily. (Patient not taking: No sig reported) 90 tablet 1    lithium carbonate (ESKALITH) 450 MG CR tablet Take 1 tablet (450 mg total) by mouth every 12 (twelve) hours. (Patient not taking: Reported on 01/26/2021) 60 tablet 2    metFORMIN (GLUCOPHAGE) 1000 MG tablet Take 1 tablet (1,000 mg total) by mouth 2 (two) times daily with a meal. (Patient not taking: Reported on 01/26/2021) 60 tablet 2    NOVOLOG FLEXPEN 100 UNIT/ML FlexPen Inject 5 Units into the skin with breakfast, with lunch, and with evening meal. (Patient not taking: Reported on 01/26/2021)      tobramycin-dexamethasone (TOBRADEX) ophthalmic solution Place 2 drops into both eyes every 6 (six) hours as needed. (Patient not taking: Reported on 01/26/2021)       Patient Stressors: Health problems   Medication change or noncompliance    Patient Strengths: Capable of independent living  Supportive family/friends   Treatment Modalities: Medication Management, Group therapy, Case management,  1 to 1 session with clinician, Psychoeducation, Recreational therapy.   Physician Treatment Plan for Primary Diagnosis: Schizophrenia (HCC) Long Term Goal(s): Improvement in symptoms so as ready for discharge   Short Term Goals: Compliance with prescribed medications will improve Ability to maintain clinical measurements within normal limits will  improve  Medication Management: Evaluate patient's response, side effects, and tolerance of medication regimen.  Therapeutic Interventions: 1 to 1 sessions, Unit Group sessions and Medication administration.  Evaluation of Outcomes: Progressing  Physician Treatment Plan for Secondary Diagnosis: Principal Problem:   Schizophrenia (HCC) Active Problems:   Type 2 diabetes mellitus (HCC)  Long Term Goal(s): Improvement in symptoms so as ready for discharge   Short Term Goals: Compliance with prescribed medications  will improve Ability to maintain clinical measurements within normal limits will improve     Medication Management: Evaluate patient's response, side effects, and tolerance of medication regimen.  Therapeutic Interventions: 1 to 1 sessions, Unit Group sessions and Medication administration.  Evaluation of Outcomes: Progressing   RN Treatment Plan for Primary Diagnosis: Schizophrenia (HCC) Long Term Goal(s): Knowledge of disease and therapeutic regimen to maintain health will improve  Short Term Goals: Ability to demonstrate self-control, Ability to participate in decision making will improve, and Ability to verbalize feelings will improve  Medication Management: RN will administer medications as ordered by provider, will assess and evaluate patient's response and provide education to patient for prescribed medication. RN will report any adverse and/or side effects to prescribing provider.  Therapeutic Interventions: 1 on 1 counseling sessions, Psychoeducation, Medication administration, Evaluate responses to treatment, Monitor vital signs and CBGs as ordered, Perform/monitor CIWA, COWS, AIMS and Fall Risk screenings as ordered, Perform wound care treatments as ordered.  Evaluation of Outcomes: Progressing   LCSW Treatment Plan for Primary Diagnosis: Schizophrenia (HCC) Long Term Goal(s): Safe transition to appropriate next level of care at discharge, Engage patient in  therapeutic group addressing interpersonal concerns.  Short Term Goals: Engage patient in aftercare planning with referrals and resources, Increase social support, and Increase ability to appropriately verbalize feelings  Therapeutic Interventions: Assess for all discharge needs, 1 to 1 time with Social worker, Explore available resources and support systems, Assess for adequacy in community support network, Educate family and significant other(s) on suicide prevention, Complete Psychosocial Assessment, Interpersonal group therapy.  Evaluation of Outcomes: Progressing   Progress in Treatment: Attending groups: Yes. Participating in groups: Yes. Taking medication as prescribed: Yes. Toleration medication: Yes. Family/Significant other contact made: Yes, individual(s) contacted:  declined consents Patient understands diagnosis: Yes. Discussing patient identified problems/goals with staff: Yes. Medical problems stabilized or resolved: Yes. Denies suicidal/homicidal ideation: Yes. Issues/concerns per patient self-inventory: No. Other: None  New problem(s) identified: No, Describe:  None  New Short Term/Long Term Goal(s):medication stabilization, elimination of SI thoughts, development of comprehensive mental wellness plan.   Patient Goals: No goal  Discharge Plan or Barriers: Pt will return to apartment and f/u with his CST team.  Reason for Continuation of Hospitalization: Medication stabilization  Estimated Length of Stay: 3-5 days   Scribe for Treatment Team: Chrys Racer 02/23/2021 12:05 PM

## 2021-02-23 NOTE — Progress Notes (Signed)
[facility] MD Progress Note  02/23/2021 7:11 AM Mitchell [name]  MRN:  [id number] Subjective:   Mitchell [name] is a 60 year old male with a PPHx of Schizophrenia and multiple hospitalizations presents under IVC for acute psychosis.   Psychiatric Team made the following recommendations yesterday: -Continue Haldol 10 mg BID for psychosis  -Continue Clozapine 50 mg am/50 mg pm.    Case was discussed in the multidisciplinary team. MAR was reviewed and patient was compliant with medications. CSW discussed setting patient up with [facility] and [facility] so he will be able to receive have diabetes and blood glucose more appropriately managed.   Patient was seen in the room and assessed on the unit.  On interview patient reports that he is "doing good".  He reports that he is sleeping good.  He reports his appetite is doing good.  He reports no suicidal ideation, homicidal ideation, or auditory visual hallucinations.   Patient denies any significant side effects from clozapine.  Patient was alert was alert and oriented to time, and place.  Patient insisted he is the president of the [place].  When asked who the president was, patient reports that he was and his name was Biden.  Patient continues to pleasant and cooperative throughout assessment.  ROS for clozapine was reassessed today: Malaise: denies Chest pain: denies Shortness of breath: denies Exertional capacity: denies Tachycardia: denies Cough: denies Fever: denies Sedation: denies Orthostatic hypotension (dizziness with standing): denies symptoms but orthostatics showed 147/90 sitting and 122/73 standing. Repeat orthostatics 123/84 sitting, 112/73 standing, HR stable in 90's during repeat test. Will continue to monitor. Hypersalivation: denies Constipation: denies Symptoms of GERD: denies Nausea: denies Nocturnal enuresis: denies   Principal Problem: Schizophrenia ([place]) Diagnosis: Principal Problem:   Schizophrenia ([place]) Active Problems:    Type 2 diabetes mellitus ([place])   Past Psychiatric History: Schizophrenia and multiple hospitalizations  Past Medical History:  Past Medical History:  Diagnosis Date   Bipolar affective disorder ([place])    takes Zyprexa daily   Diabetes mellitus    takes Victoza,Metformin,and Glipizide daily   Hypertension    takes Amlodipine,Lisinopril and Clonidine daily   Hyponatremia    history of   Mental disorder    takes Lithium daily   Schizoaffective disorder    takes Trazodone nightly   Seasonal allergies    takes Claritin daily   Sleep apnea    sleep study >30yr ago   [facility] ([facility]    left arm weakness    Past Surgical History:  Procedure Laterality Date   CATARACT EXTRACTION W/PHACO Right 02/14/2013   Procedure: CATARACT EXTRACTION PHACO AND INTRAOCULAR LENS PLACEMENT ([place];  Surgeon: GAdonis Brook MD;  [place]  Service: Ophthalmology;  Laterality: Right;   CATARACT EXTRACTION W/PHACO Left 06/13/2013   Procedure: CATARACT EXTRACTION PHACO AND INTRAOCULAR LENS PLACEMENT (I[facility]);  Surgeon: GAdonis Brook MD;  [place]  Service: Ophthalmology;  Laterality: Left;   CIRCUMCISION  20 yrs. ago   EYE SURGERY     Family History: History reviewed. No pertinent family history. Family Psychiatric  History: BAPD Social History:  Social History   Substance and Sexual Activity  Alcohol Use Not Currently     Social History   Substance and Sexual Activity  Drug Use No    Social History   Socioeconomic History   Marital status: Divorced    Spouse name: Not on file   Number of children: Not on file   Years of education: Not on file   Highest  education level: Not on file  Occupational History   Not on file  Tobacco Use   Smoking status: Never   Smokeless tobacco: Never  Vaping Use   Vaping Use: Never used  Substance and Sexual Activity   Alcohol use: Not Currently   Drug use: No   Sexual activity: Yes    Birth control/protection: None  Other Topics Concern   Not  on file  Social History Narrative   Not on file   Social Determinants of Health   Financial Resource Strain: Not on file  Food Insecurity: Not on file  Transportation Needs: Not on file  Physical Activity: Not on file  Stress: Not on file  Social Connections: Not on file   Additional Social History:                         Sleep: Good  Appetite:  Good  Current Medications: Current Facility-Administered Medications  Medication Dose Route Frequency Provider Last Rate Last Admin   acetaminophen (TYLENOL) tablet 650 mg  650 mg Oral Q6H PRN Chalmers Guest, NP       alum & mag hydroxide-simeth (MAALOX/MYLANTA) 200-200-20 MG/5ML suspension 30 mL  30 mL Oral Q4H PRN Chalmers Guest, NP       amLODipine (NORVASC) tablet 10 mg  10 mg Oral Daily Chalmers Guest, NP   10 mg at 02/22/21 2202   aspirin EC tablet 81 mg  81 mg Oral Daily Chalmers Guest, NP   81 mg at 02/22/21 0738   atenolol (TENORMIN) tablet 12.5 mg  12.5 mg Oral Daily France Ravens, MD   12.5 mg at 02/21/21 5427   benztropine (COGENTIN) tablet 1 mg  1 mg Oral BID Chalmers Guest, NP   1 mg at 02/22/21 1726   cloNIDine (CATAPRES) tablet 0.2 mg  0.2 mg Oral BID Massengill, Ovid Curd, MD   0.2 mg at 02/22/21 1725   cloZAPine (CLOZARIL) tablet 50 mg  50 mg Oral BID France Ravens, MD   50 mg at 02/22/21 1726   glipiZIDE (GLUCOTROL XL) 24 hr tablet 10 mg  10 mg Oral Q breakfast Chalmers Guest, NP   10 mg at 02/22/21 0623   haloperidol (HALDOL) tablet 10 mg  10 mg Oral BID France Ravens, MD   10 mg at 02/22/21 1726   insulin aspart (novoLOG) injection 0-15 Units  0-15 Units Subcutaneous TID WC Hill, Jackie Plum, MD   8 Units at 02/22/21 1722   insulin aspart (novoLOG) injection 0-5 Units  0-5 Units Subcutaneous QHS Chalmers Guest, NP   2 Units at 02/22/21 2134   insulin aspart (novoLOG) injection 25 Units  25 Units Subcutaneous TID WC France Ravens, MD   25 Units at 02/22/21 1721   insulin glargine-yfgn (SEMGLEE) injection 55 Units  55  Units Subcutaneous Daily France Ravens, MD   55 Units at 02/22/21 0732   lisinopril (ZESTRIL) tablet 40 mg  40 mg Oral Daily Chalmers Guest, NP   40 mg at 02/22/21 0738   lithium carbonate (ESKALITH) CR tablet 450 mg  450 mg Oral Q12H Chalmers Guest, NP   450 mg at 02/22/21 2132   magnesium hydroxide (MILK OF MAGNESIA) suspension 30 mL  30 mL Oral Daily PRN Chalmers Guest, NP       OLANZapine zydis (ZYPREXA) disintegrating tablet 10 mg  10 mg Oral Q8H PRN Chalmers Guest, NP   10 mg at 02/03/21  2342   And   ziprasidone (GEODON) injection 20 mg  20 mg Intramuscular PRN Chalmers Guest, NP       polyethylene glycol (MIRALAX / GLYCOLAX) packet 17 g  17 g Oral Daily PRN Ethelene Hal, NP       senna-docusate (Senokot-S) tablet 1 tablet  1 tablet Oral Daily Corky Sox, MD   1 tablet at 02/22/21 2778   traZODone (DESYREL) tablet 50 mg  50 mg Oral QHS PRN Chalmers Guest, NP   50 mg at 02/22/21 2132    Lab Results:  Results for orders placed or performed during the hospital encounter of 01/28/21 (from the past 48 hour(s))  Glucose, capillary     Status: Abnormal   Collection Time: 02/21/21 12:11 PM  Result Value Ref Range   Glucose-Capillary 276 (H) 70 - 99 mg/dL    Comment: Glucose reference range applies only to samples taken after fasting for at least 8 hours.  Glucose, capillary     Status: Abnormal   Collection Time: 02/21/21  4:57 PM  Result Value Ref Range   Glucose-Capillary 204 (H) 70 - 99 mg/dL    Comment: Glucose reference range applies only to samples taken after fasting for at least 8 hours.  Glucose, capillary     Status: Abnormal   Collection Time: 02/21/21  7:41 PM  Result Value Ref Range   Glucose-Capillary 183 (H) 70 - 99 mg/dL    Comment: Glucose reference range applies only to samples taken after fasting for at least 8 hours.  Glucose, capillary     Status: Abnormal   Collection Time: 02/22/21  5:47 AM  Result Value Ref Range   Glucose-Capillary 164 (H) 70 - 99  mg/dL    Comment: Glucose reference range applies only to samples taken after fasting for at least 8 hours.  Glucose, capillary     Status: Abnormal   Collection Time: 02/22/21 11:58 AM  Result Value Ref Range   Glucose-Capillary 242 (H) 70 - 99 mg/dL    Comment: Glucose reference range applies only to samples taken after fasting for at least 8 hours.  Glucose, capillary     Status: Abnormal   Collection Time: 02/22/21  5:08 PM  Result Value Ref Range   Glucose-Capillary 252 (H) 70 - 99 mg/dL    Comment: Glucose reference range applies only to samples taken after fasting for at least 8 hours.  Glucose, capillary     Status: Abnormal   Collection Time: 02/22/21  9:00 PM  Result Value Ref Range   Glucose-Capillary 215 (H) 70 - 99 mg/dL    Comment: Glucose reference range applies only to samples taken after fasting for at least 8 hours.   Comment 1 Notify RN   Glucose, capillary     Status: Abnormal   Collection Time: 02/23/21  6:22 AM  Result Value Ref Range   Glucose-Capillary 223 (H) 70 - 99 mg/dL    Comment: Glucose reference range applies only to samples taken after fasting for at least 8 hours.    Blood Alcohol level:  Lab Results  Component Value Date   ETH <10 01/26/2021   ETH <10 24/23/5361    Metabolic Disorder Labs: Lab Results  Component Value Date   HGBA1C 11.1 (H) 01/26/2021   MPG 271.87 01/26/2021   MPG 202.99 01/07/2019   No results found for: PROLACTIN Lab Results  Component Value Date   CHOL 140 01/30/2021   TRIG 95 01/30/2021   HDL  49 01/30/2021   CHOLHDL 2.9 01/30/2021   VLDL 19 01/30/2021   LDLCALC 72 01/30/2021   LDLCALC 67 05/20/2020    Physical Findings:   Musculoskeletal: Strength & Muscle Tone: within normal limits Gait & Station: normal Patient leans: N/A  Psychiatric Specialty Exam:  Presentation  General Appearance: Casual Eye Contact:Fair  Speech:Clear and Coherent; Normal Rate  Speech  Volume:Normal  Handedness:Right   Mood and Affect  Mood:Euthymic  Affect:Congruent   Thought Process  Thought Processes:Coherent  Descriptions of Associations:Intact  Orientation:Full (Time, Place and Person)  Thought Content:continues to have grandiose delusions and possible paranoid delusions regarding name  History of Schizophrenia/Schizoaffective disorder:Yes  Duration of Psychotic Symptoms:Greater than six months  Hallucinations: none Ideas of Reference:None  Suicidal Thoughts: none Homicidal Thoughts:none  Sensorium  Memory:Immediate Fair; Recent Fair  Judgment:Fair  Insight:Fair   Executive Functions  Concentration:Fair  Attention Span:Fair  [place]  Language:Good   Psychomotor Activity  Psychomotor Activity: normal  Assets  Assets:Communication Skills; Resilience   Sleep  Sleep:No data recorded   Physical Exam: Physical Exam Vitals and nursing note reviewed.  Constitutional:      General: He is not in acute distress.    Appearance: Normal appearance. He is obese. He is not ill-appearing or toxic-appearing.  HENT:     Head: Normocephalic and atraumatic.  Pulmonary:     Effort: Pulmonary effort is normal.  Musculoskeletal:        General: Normal range of motion.  Neurological:     General: No focal deficit present.     Mental Status: He is alert.   Review of Systems  Respiratory:  Negative for cough and shortness of breath.   Cardiovascular:  Negative for chest pain.  Gastrointestinal:  Negative for abdominal pain, constipation, diarrhea, nausea and vomiting.  Neurological:  Negative for weakness and headaches.  Psychiatric/Behavioral:  Negative for depression, hallucinations and suicidal ideas. The patient is not nervous/anxious.    Blood pressure 122/73, pulse (!) 105, temperature (!) 97.5 F (36.4 C), temperature source Oral, resp. rate 20, height 5' 3.5" (1.613 m), weight 91.6 kg, SpO2 98 %.  Body mass index is 35.22 kg/m.   Treatment Plan Summary: Daily contact with patient to assess and evaluate symptoms and progress in treatment and Medication management  Mitchell [name] is a 60 year old male with a PPHx of Schizophrenia and multiple hospitalizations presents under IVC for acute psychosis.   Patient appears less sedated today. Referrals to [facility] and [facility] are pending per CSW to ensure he has support to safely return to his apartment.  Otherwise, we will need to consider recommending group home given patient's diabetes and CBG ranging from 150s to 400s. We will not make any medication changes at this time.  We will continue to monitor.    Schizophrenia: -Continue Haldol 10 mg BID -Continue Clozaril 50 mg BID -Continue Lithium 450 mg BID (Lithium level 0.70 02/23/2021) -Continue Senokot-S daily -Continue Cogentin 1 mg BID for EPS prophylaxis -Continue Atenolol 12.5 mg daily -Clozaril labs 11/28 (next set due 12/5)  -Troponin 3  -CMP: AST 45, ALT 71, Alk Phos 159  -CBC: wnl, ANC: 3.4  -CRP: pending  -ECG: pending   HTN: -Continue Lisinopril 40 mg daily -Continue Amlodipine 10 mg daily -Continue Clonidine 0.2 mg BID   Type 2 Diabetes: -Continue glargine 55 units -Continue moderate SSI -Continue QHS correction -Continue 25 units pranidal coverage  -Continue home Glipizide   ABD distention, transaminitis: According staff this has increased since  admission. KUB shows non-obstructive gas pattern with no obvious stool burden. ABD exam benign except for protuberant abdomen. ALT persistently elevated to 62. Concern was for ascites despite negative fluid wave. RUQ [place] negative for hepatic parenchymal disease or biliary disease. For ascites causes, ruled out parenchymal disease of the liver, CHF (BNP WNL), and patient likely does not have cancer. Ammonia WNL. Lipase slightly elevated.  -Hepatitis panel negative  -Continue PRN's: Tylenol, Maalox, Atarax, Milk of Magnesia,  Trazodone   France Ravens, MD 02/23/2021, 7:11 AM

## 2021-02-23 NOTE — Progress Notes (Signed)
Pt was encouraged to go to orientation/goals group but didn't attend.  

## 2021-02-23 NOTE — Progress Notes (Signed)
Adult Psychoeducational Group Note  Date:  02/23/2021 Time:  9:13 PM  Group Topic/Focus:  Wrap-Up Group:   The focus of this group is to help patients review their daily goal of treatment and discuss progress on daily workbooks.  Participation Level:  Active  Participation Quality:  Appropriate  Affect:  Appropriate  Cognitive:  Appropriate  Insight: Appropriate  Engagement in Group:  Engaged  Modes of Intervention:  Discussion  Additional Comments:  Pt stated his goal for today was to focus on his treatment plan. Pt stated he accomplished his goal today. Pt stated he talked with his doctor and social worker about his care today. Pt rated his overall day a 10. Pt stated he was able to contact his sister today which improved his overall day. Pt stated he felt better about himself today. Pt stated he was able to attend all meals. Pt stated he took all medications provided today. Pt stated he attend all groups held today. Pt stated his appetite was pretty good today. Pt rated sleep last night was pretty good. Pt stated the goal tonight was to get some rest. Pt stated he had no physical pain tonight. Pt deny visual hallucinations and auditory issues tonight. Pt denies thoughts of harming himself or others. Pt stated he would alert staff if anything changed.  Mitchell [name] 02/23/2021, 9:13 PM

## 2021-02-24 LAB — GLUCOSE, CAPILLARY
Glucose-Capillary: 229 mg/dL — ABNORMAL HIGH (ref 70–99)
Glucose-Capillary: 208 mg/dL — ABNORMAL HIGH (ref 70–99)
Glucose-Capillary: 299 mg/dL — ABNORMAL HIGH (ref 70–99)
Glucose-Capillary: 267 mg/dL — ABNORMAL HIGH (ref 70–99)

## 2021-02-24 MED ORDER — INSULIN ASPART PROT & ASPART (70-30 MIX) 100 UNIT/ML ~~LOC~~ SUSP
42.0000 [IU] | Freq: Two times a day (BID) | SUBCUTANEOUS | Status: DC
  Administered 2021-02-24 – 2021-02-26 (×5): 42 [IU] via SUBCUTANEOUS

## 2021-02-24 MED ORDER — GLIPIZIDE ER 10 MG PO TB24
20.0000 mg | ORAL_TABLET | Freq: Every day | ORAL | Status: DC
  Administered 2021-02-25 – 2021-03-02 (×6): 20 mg via ORAL
  Filled 2021-02-24 (×8): qty 2

## 2021-02-24 NOTE — Group Note (Deleted)
Recreation Therapy Group Note   Group Topic:Leisure Education  Group Date: 02/24/2021 Start Time: 1000 End Time: 1100 Facilitators: Wave Calzada A, NT Location: [address] Dayroom       Affect/Mood: {RT BHH Affect/Mood:26271}   Participation Level: {RT BHH Participation Level:26267}   Participation Quality: {RT BHH Participation Quality:26268}   Behavior: {RT BHH Group Behavior:26269}   Speech/Thought Process: {RT BHH Speech/Thought:26276}   Insight: {RT BHH Insight:26272}   Judgement: {RT BHH Judgement:26278}   Modes of Intervention: {RT BHH Modes of Intervention:26277}   Patient Response to Interventions:  {RT BHH Patient Response to Intervention:26274}   Education Outcome:  {RT BHH Education Outcome:26279}   Clinical Observations/Individualized Feedback: *** was *** in their participation of session activities and group discussion. Pt identified ***   Plan: {RT BHH Tx Plan:26280}   Ladene Allocca A, NT,  02/24/2021 11:55 AM 

## 2021-02-24 NOTE — BHH Counselor (Signed)
Per [facility] this patient does not qualify for their services due to the type of Medicare he has.   Ruthann Cancer MSW, LCSW Clincal Social Worker  [facility]

## 2021-02-24 NOTE — BHH Group Notes (Signed)
Adult Psychoeducational Group Note  Date:  02/24/2021 Time:  9:27 AM  Group Topic/Focus:  Goals Group:   The focus of this group is to help patients establish daily goals to achieve during treatment and discuss how the patient can incorporate goal setting into their daily lives to aide in recovery.  Participation Level:  Active  Participation Quality:  Inattentive  Affect:  Resistant  Cognitive:  Confused  Insight: Lacking  Engagement in Group:  Lacking  Modes of Intervention:  Clarification  Additional Comments:  Pt did not have a reasonable goal  Mitchell [name] 02/24/2021, 9:27 AM

## 2021-02-24 NOTE — Progress Notes (Addendum)
[facility] MD Progress Note  02/24/2021 2:45 PM CORNELLIUS KROPP  MRN:  [id number] Subjective:   Mitchell [name] is a 60 year old male with a PPHx of Schizophrenia and multiple hospitalizations presents under IVC for acute psychosis.   Psychiatric Team made the following recommendations yesterday: -Continue Haldol 10 mg BID for psychosis  -Continue Clozapine 50 mg am/50 mg pm.    Case was discussed in the multidisciplinary team. MAR was reviewed and patient was compliant with medications. CSW discussed setting patient up with [facility] and [facility] so he will be able to receive have diabetes and blood glucose more appropriately managed.   Patient was seen in the room and assessed on the unit.  On interview patient reports that he is "doing good".  He reports that he is sleeping good.  He reports his appetite is doing good.  He reports no suicidal ideation, homicidal ideation, or auditory visual hallucinations.   Patient denies any significant side effects from clozapine.  Patient was alert was alert and oriented to time, and place.  Patient insisted he is the president of the [place].  When asked who the president was, patient reports that he was and his name was Biden.  Patient continues to pleasant and cooperative throughout assessment.  ROS for clozapine was reassessed today: Malaise: denies Chest pain: denies Shortness of breath: denies Exertional capacity: denies Tachycardia: denies Cough: denies Fever: denies Sedation: denies Orthostatic hypotension (dizziness with standing): denies Hypersalivation: denies Constipation: denies Symptoms of GERD: denies Nausea: denies Nocturnal enuresis: denies   Principal Problem: Schizophrenia ([place]) Diagnosis: Principal Problem:   Schizophrenia ([place]) Active Problems:   Type 2 diabetes mellitus ([place])   Past Psychiatric History: Schizophrenia and multiple hospitalizations  Past Medical History:  Past Medical History:  Diagnosis Date   Bipolar  affective disorder ([place])    takes Zyprexa daily   Diabetes mellitus    takes Victoza,Metformin,and Glipizide daily   Hypertension    takes Amlodipine,Lisinopril and Clonidine daily   Hyponatremia    history of   Mental disorder    takes Lithium daily   Schizoaffective disorder    takes Trazodone nightly   Seasonal allergies    takes Claritin daily   Sleep apnea    sleep study >63yr ago   Stroke ([facility]    left arm weakness    Past Surgical History:  Procedure Laterality Date   CATARACT EXTRACTION W/PHACO Right 02/14/2013   Procedure: CATARACT EXTRACTION PHACO AND INTRAOCULAR LENS PLACEMENT ([place];  Surgeon: GAdonis Brook MD;  [place] [facility]  Service: Ophthalmology;  Laterality: Right;   CATARACT EXTRACTION W/PHACO Left 06/13/2013   Procedure: CATARACT EXTRACTION PHACO AND INTRAOCULAR LENS PLACEMENT (I[facility]);  Surgeon: GAdonis Brook MD;  [place] [facility]  Service: Ophthalmology;  Laterality: Left;   CIRCUMCISION  20 yrs. ago   EYE SURGERY     Family History: History reviewed. No pertinent family history. Family Psychiatric  History: BAPD Social History:  Social History   Substance and Sexual Activity  Alcohol Use Not Currently     Social History   Substance and Sexual Activity  Drug Use No    Social History   Socioeconomic History   Marital status: Divorced    Spouse name: Not on file   Number of children: Not on file   Years of education: Not on file   Highest education level: Not on file  Occupational History   Not on file  Tobacco Use   Smoking status: Never   Smokeless tobacco: Never  Vaping Use   Vaping Use: Never used  Substance and Sexual Activity   Alcohol use: Not Currently   Drug use: No   Sexual activity: Yes    Birth control/protection: None  Other Topics Concern   Not on file  Social History Narrative   Not on file   Social Determinants of Health   Financial Resource Strain: Not on file  Food Insecurity: Not on file  Transportation Needs:  Not on file  Physical Activity: Not on file  Stress: Not on file  Social Connections: Not on file   Additional Social History:                         Sleep: Good  Appetite:  Good  Current Medications: Current Facility-Administered Medications  Medication Dose Route Frequency Provider Last Rate Last Admin   acetaminophen (TYLENOL) tablet 650 mg  650 mg Oral Q6H PRN Chalmers Guest, NP       alum & mag hydroxide-simeth (MAALOX/MYLANTA) 200-200-20 MG/5ML suspension 30 mL  30 mL Oral Q4H PRN Chalmers Guest, NP       amLODipine (NORVASC) tablet 10 mg  10 mg Oral Daily Chalmers Guest, NP   10 mg at 02/24/21 4540   aspirin EC tablet 81 mg  81 mg Oral Daily Chalmers Guest, NP   81 mg at 02/24/21 0826   atenolol (TENORMIN) tablet 12.5 mg  12.5 mg Oral Daily France Ravens, MD   12.5 mg at 02/24/21 0827   benztropine (COGENTIN) tablet 1 mg  1 mg Oral BID Chalmers Guest, NP   1 mg at 02/24/21 9811   cloNIDine (CATAPRES) tablet 0.2 mg  0.2 mg Oral BID Massengill, Ovid Curd, MD   0.2 mg at 02/24/21 0825   cloZAPine (CLOZARIL) tablet 50 mg  50 mg Oral BID France Ravens, MD   50 mg at 02/24/21 0826   [START ON 02/25/2021] glipiZIDE (GLUCOTROL XL) 24 hr tablet 20 mg  20 mg Oral Q breakfast France Ravens, MD       haloperidol (HALDOL) tablet 10 mg  10 mg Oral BID France Ravens, MD   10 mg at 02/24/21 0825   insulin aspart (novoLOG) injection 0-15 Units  0-15 Units Subcutaneous TID WC Jazlynne Milliner, Jackie Plum, MD   5 Units at 02/24/21 1150   insulin aspart (novoLOG) injection 0-5 Units  0-5 Units Subcutaneous QHS Chalmers Guest, NP   2 Units at 02/22/21 2134   insulin aspart protamine- aspart (NOVOLOG MIX 70/30) injection 42 Units  42 Units Subcutaneous BID WC France Ravens, MD       lisinopril (ZESTRIL) tablet 40 mg  40 mg Oral Daily Chalmers Guest, NP   40 mg at 02/24/21 0826   lithium carbonate (ESKALITH) CR tablet 450 mg  450 mg Oral Q12H Chalmers Guest, NP   450 mg at 02/24/21 0826   magnesium hydroxide (MILK  OF MAGNESIA) suspension 30 mL  30 mL Oral Daily PRN Chalmers Guest, NP       OLANZapine zydis (ZYPREXA) disintegrating tablet 10 mg  10 mg Oral Q8H PRN Chalmers Guest, NP   10 mg at 02/03/21 2342   And   ziprasidone (GEODON) injection 20 mg  20 mg Intramuscular PRN Chalmers Guest, NP       polyethylene glycol (MIRALAX / GLYCOLAX) packet 17 g  17 g Oral Daily PRN Ethelene Hal, NP  senna-docusate (Senokot-S) tablet 1 tablet  1 tablet Oral Daily Corky Sox, MD   1 tablet at 02/24/21 5397   traZODone (DESYREL) tablet 50 mg  50 mg Oral Aliene Altes, MD   50 mg at 02/23/21 2102    Lab Results:  Results for orders placed or performed during the hospital encounter of 01/28/21 (from the past 48 hour(s))  Glucose, capillary     Status: Abnormal   Collection Time: 02/22/21  5:08 PM  Result Value Ref Range   Glucose-Capillary 252 (H) 70 - 99 mg/dL    Comment: Glucose reference range applies only to samples taken after fasting for at least 8 hours.  Glucose, capillary     Status: Abnormal   Collection Time: 02/22/21  9:00 PM  Result Value Ref Range   Glucose-Capillary 215 (H) 70 - 99 mg/dL    Comment: Glucose reference range applies only to samples taken after fasting for at least 8 hours.   Comment 1 Notify RN   Glucose, capillary     Status: Abnormal   Collection Time: 02/23/21  6:22 AM  Result Value Ref Range   Glucose-Capillary 223 (H) 70 - 99 mg/dL    Comment: Glucose reference range applies only to samples taken after fasting for at least 8 hours.  CBC with Differential/Platelet     Status: Abnormal   Collection Time: 02/23/21  6:44 AM  Result Value Ref Range   WBC 6.8 4.0 - 10.5 K/uL   RBC 4.40 4.22 - 5.81 MIL/uL   Hemoglobin 13.5 13.0 - 17.0 g/dL   HCT 41.3 39.0 - 52.0 %   MCV 93.9 80.0 - 100.0 fL   MCH 30.7 26.0 - 34.0 pg   MCHC 32.7 30.0 - 36.0 g/dL   RDW 12.5 11.5 - 15.5 %   Platelets 260 150 - 400 K/uL   nRBC 0.0 0.0 - 0.2 %   Neutrophils Relative % 50 %    Neutro Abs 3.4 1.7 - 7.7 K/uL   Lymphocytes Relative 35 %   Lymphs Abs 2.4 0.7 - 4.0 K/uL   Monocytes Relative 8 %   Monocytes Absolute 0.6 0.1 - 1.0 K/uL   Eosinophils Relative 5 %   Eosinophils Absolute 0.4 0.0 - 0.5 K/uL   Basophils Relative 1 %   Basophils Absolute 0.1 0.0 - 0.1 K/uL   Immature Granulocytes 1 %   Abs Immature Granulocytes 0.08 (H) 0.00 - 0.07 K/uL    Comment: Performed at [facility], [place] [address], [place], [place] [address]  C-reactive protein     Status: Abnormal   Collection Time: 02/23/21  6:44 AM  Result Value Ref Range   CRP 2.1 (H) <1.0 mg/dL    Comment: Performed at [place] [address], [place], [place] [address]  Lithium level     Status: None   Collection Time: 02/23/21  6:44 AM  Result Value Ref Range   Lithium Lvl 0.70 0.60 - 1.20 mmol/L    Comment: Performed at [facility], [place] [address], [place], [place] [address]  Comprehensive metabolic panel     Status: Abnormal   Collection Time: 02/23/21  6:44 AM  Result Value Ref Range   Sodium 135 135 - 145 mmol/L   Potassium 4.5 3.5 - 5.1 mmol/L   Chloride 102 98 - 111 mmol/L   CO2 26 22 - 32 mmol/L   Glucose, Bld 213 (H) 70 - 99 mg/dL    Comment: Glucose  reference range applies only to samples taken after fasting for at least 8 hours.   BUN 20 6 - 20 mg/dL   Creatinine, Ser 1.10 0.61 - 1.24 mg/dL   Calcium 9.4 8.9 - 10.3 mg/dL   Total Protein 7.6 6.5 - 8.1 g/dL   Albumin 3.8 3.5 - 5.0 g/dL   AST 45 (H) 15 - 41 U/L   ALT 71 (H) 0 - 44 U/L   Alkaline Phosphatase 159 (H) 38 - 126 U/L   Total Bilirubin 0.6 0.3 - 1.2 mg/dL   GFR, Estimated >60 >60 mL/min    Comment: (NOTE) Calculated using the CKD-EPI Creatinine Equation (2021)    Anion gap 7 5 - 15    Comment: Performed at [facility], [place] [address], [place], [place] [address]  Troponin I (High Sensitivity)     Status: None   Collection Time: 02/23/21  6:44 AM  Result  Value Ref Range   Troponin I (High Sensitivity) 3 <18 ng/L    Comment: (NOTE) Elevated high sensitivity troponin I (hsTnI) values and significant  changes across serial measurements may suggest ACS but many other  chronic and acute conditions are known to elevate hsTnI results.  Refer to the "Links" section for chest pain algorithms and additional  guidance. Performed at [facility], [place] [address], [place] [address]   Glucose, capillary     Status: Abnormal   Collection Time: 02/23/21 11:57 AM  Result Value Ref Range   Glucose-Capillary 216 (H) 70 - 99 mg/dL    Comment: Glucose reference range applies only to samples taken after fasting for at least 8 hours.  Glucose, capillary     Status: Abnormal   Collection Time: 02/23/21  5:01 PM  Result Value Ref Range   Glucose-Capillary 149 (H) 70 - 99 mg/dL    Comment: Glucose reference range applies only to samples taken after fasting for at least 8 hours.  Glucose, capillary     Status: Abnormal   Collection Time: 02/23/21  7:29 PM  Result Value Ref Range   Glucose-Capillary 145 (H) 70 - 99 mg/dL    Comment: Glucose reference range applies only to samples taken after fasting for at least 8 hours.  Glucose, capillary     Status: Abnormal   Collection Time: 02/24/21  5:42 AM  Result Value Ref Range   Glucose-Capillary 229 (H) 70 - 99 mg/dL    Comment: Glucose reference range applies only to samples taken after fasting for at least 8 hours.  Glucose, capillary     Status: Abnormal   Collection Time: 02/24/21 11:44 AM  Result Value Ref Range   Glucose-Capillary 208 (H) 70 - 99 mg/dL    Comment: Glucose reference range applies only to samples taken after fasting for at least 8 hours.    Blood Alcohol level:  Lab Results  Component Value Date   ETH <10 01/26/2021   ETH <10 89/37/3428    Metabolic Disorder Labs: Lab Results  Component Value Date   HGBA1C 11.1 (H) 01/26/2021   MPG 271.87 01/26/2021    MPG 202.99 01/07/2019   No results found for: PROLACTIN Lab Results  Component Value Date   CHOL 140 01/30/2021   TRIG 95 01/30/2021   HDL 49 01/30/2021   CHOLHDL 2.9 01/30/2021   VLDL 19 01/30/2021   LDLCALC 72 01/30/2021   LDLCALC 67 05/20/2020    Physical Findings:   Musculoskeletal: Strength & Muscle Tone: within normal limits Gait &  Station: normal Patient leans: N/A  Psychiatric Specialty Exam:  Presentation  General Appearance: Casual Eye Contact:Fair  Speech:Clear and Coherent; Normal Rate  Speech Volume:Normal  Handedness:Right   Mood and Affect  Mood:Euthymic  Affect:Congruent   Thought Process  Thought Processes:Coherent  Descriptions of Associations:Intact  Orientation:Full (Time, Place and Person)  Thought Content:continues to have grandiose delusions and possible paranoid delusions regarding name  History of Schizophrenia/Schizoaffective disorder:Yes  Duration of Psychotic Symptoms:Greater than six months  Hallucinations: none Ideas of Reference:None  Suicidal Thoughts: none Homicidal Thoughts:none  Sensorium  Memory:Immediate Fair; Recent Fair  Judgment:Fair  Insight:Fair   Executive Functions  Concentration:Fair  Attention Span:Fair  [place]  Language:Good   Psychomotor Activity  Psychomotor Activity: normal  Assets  Assets:Communication Skills; Resilience   Sleep  Sleep:No data recorded   Physical Exam: Physical Exam Vitals and nursing note reviewed.  Constitutional:      General: He is not in acute distress.    Appearance: Normal appearance. He is obese. He is not ill-appearing or toxic-appearing.  HENT:     Head: Normocephalic and atraumatic.  Pulmonary:     Effort: Pulmonary effort is normal.  Musculoskeletal:        General: Normal range of motion.  Neurological:     General: No focal deficit present.     Mental Status: He is alert.   Review of Systems   Respiratory:  Negative for cough and shortness of breath.   Cardiovascular:  Negative for chest pain.  Gastrointestinal:  Negative for abdominal pain, constipation, diarrhea, nausea and vomiting.  Neurological:  Negative for weakness and headaches.  Psychiatric/Behavioral:  Negative for depression, hallucinations and suicidal ideas. The patient is not nervous/anxious.    Blood pressure 123/85, pulse (!) 101, temperature 98.2 F (36.8 C), temperature source Oral, resp. rate 20, height 5' 3.5" (1.613 m), weight 91.6 kg, SpO2 98 %. Body mass index is 35.22 kg/m.   Treatment Plan Summary: Daily contact with patient to assess and evaluate symptoms and progress in treatment and Medication management  Izayah Miner is a 60 year old male with a PPHx of Schizophrenia and multiple hospitalizations presents under IVC for acute psychosis.   Patient appears less sedated today. [facility] and [facility] are pending per CSW to ensure he has support to safely return to his apartment.  Otherwise, we will need to consider recommending group home given patient's diabetes and CBG ranging from 150s to 400s. We will not make any medication changes at this time.  We will continue to monitor.    Schizophrenia: -Continue Haldol 10 mg BID -Continue Clozaril 50 mg BID -Continue Lithium 450 mg BID (Lithium level 0.70 02/23/2021) -Continue Senokot-S daily -Continue Cogentin 1 mg BID for EPS prophylaxis -Continue Atenolol 12.5 mg daily -Clozaril labs 11/28 (next set due 12/5)  -Troponin 3  -CMP: AST 45, ALT 71, Alk Phos 159  -CBC: wnl, ANC: 3.4  -CRP: 2.1  -ECG: Normal sinus rhythm, Qtc 452   HTN: -Continue Lisinopril 40 mg daily -Continue Amlodipine 10 mg daily -Continue Clonidine 0.2 mg BID   Type 2 Diabetes: -Per Diabetic Coordinator, plan to adjust insulin coverage to account for discharge. Appreciate recs  -Discontinue Semglee, and Novolog meal coverage -START 70/30 Insulin 42 units bid with  meals  -INCREASE Glipizide 20 mg daily starting tomorrow 11/30 -Continue Novlog 0-15 units tid for meals and nighttime coverage   ABD distention, transaminitis: According staff this has increased since admission. KUB shows non-obstructive gas pattern with  no obvious stool burden. ABD exam benign except for protuberant abdomen. ALT persistently elevated to 62. Concern was for ascites despite negative fluid wave. RUQ [place] negative for hepatic parenchymal disease or biliary disease. For ascites causes, ruled out parenchymal disease of the liver, CHF (BNP WNL), and patient likely does not have cancer. Ammonia WNL. Lipase slightly elevated.  -Hepatitis panel negative  -Continue PRN's: Tylenol, Maalox, Atarax, Milk of Magnesia, Trazodone   France Ravens, MD 02/24/2021, 2:45 PM

## 2021-02-24 NOTE — Progress Notes (Signed)
Pt expressed his displeasure of a new pt on the unit being disruptive  02/24/21 2200  Psych Admission Type (Psych Patients Only)  Admission Status Involuntary  Psychosocial Assessment  Patient Complaints None  Eye Contact Fair  Facial Expression Flat  Affect Appropriate to circumstance  Speech Slow  Interaction Forwards little  Motor Activity Slow  Appearance/Hygiene Disheveled  Behavior Characteristics Cooperative  Mood Pleasant  Thought Process  Coherency WDL  Content WDL  Delusions None reported or observed  Perception WDL  Hallucination None reported or observed  Judgment WDL  Confusion None  Danger to Self  Current suicidal ideation? Denies  Danger to Others  Danger to Others None reported or observed

## 2021-02-24 NOTE — BHH Counselor (Signed)
CSW left voicemail for [facility] intake in order to refer this patient to their services.    Ruthann Cancer MSW, LCSW Clincal Social Worker  [facility]

## 2021-02-24 NOTE — Progress Notes (Signed)
Inpatient Diabetes Program Recommendations  AACE/ADA: New Consensus Statement on Inpatient Glycemic Control (2015)  Target Ranges:  Prepandial:   less than 140 mg/dL      Peak postprandial:   less than 180 mg/dL (1-2 hours)      Critically ill patients:  140 - 180 mg/dL    Review of Glycemic Control  Current Orders: Semglee 55 units daily Novolog 0-15 units TID ac/hs  Novolog 30 units TID with meals Glipizide 10 mg daily   Transition to simplified regimen for home: -  70/30 42 units bid starting tonight 11/29 -  Glipizide 20 mg Daily starting tomorrow 11/30  -  remain on Novolog 0-15 units tid + hs  -  D/C: Semglee 55 and Novolog 30 units meal coverage  Have placed consult for close following and titration of 70/30 prior to d/c.  Thanks, Christena Deem RN, MSN, BC-ADM Inpatient Diabetes Coordinator Team Pager [phone] (8a-5p)

## 2021-02-24 NOTE — Progress Notes (Signed)
Adult Psychoeducational Group Note  Date:  02/24/2021 Time:  9:14 PM  Group Topic/Focus:  Wrap-Up Group:   The focus of this group is to help patients review their daily goal of treatment and discuss progress on daily workbooks.  Participation Level:  Active  Participation Quality:  Appropriate  Affect:  Appropriate  Cognitive:  Appropriate  Insight: Appropriate  Engagement in Group:  Engaged  Modes of Intervention:  Discussion  Additional Comments:  Pt stated his goal for today was to focus on his treatment plan. Pt stated he accomplished his goal today. Pt stated he talked with his doctor and social worker about his care today. Pt rated his overall day a 10. Pt stated he made no calls today. Pt stated he felt better about himself today. Pt stated he was able to attend all meals. Pt stated he took all medications provided today. Pt stated he attend all groups held today. Pt stated his appetite was pretty good today. Pt rated sleep last night was pretty good. Pt stated the goal tonight was to get some rest. Pt stated he had no physical pain tonight. Pt deny visual hallucinations and auditory issues tonight. Pt denies thoughts of harming himself or others. Pt stated he would alert staff if anything changed  [name] 02/24/2021, 9:14 PM

## 2021-02-24 NOTE — Group Note (Signed)
Recreation Therapy Group Note   Group Topic:Leisure Education  Group Date: 02/24/2021 Start Time: 1000 End Time: 1100 Facilitators: Bjorn Loser, NT Location: 500 Hall Dayroom   Goal Area(s) Addresses:  Patient will successfully identify positive leisure and recreation activities.  Patient will acknowledge benefits of participation in healthy leisure activities post discharge.  Patient will actively work with peers toward a shared goal.   Group Description:  Pictionary. Patients took turns trying to guess the picture being drawn on the board by their peer. If they guessed the correct answer, they would get the next turn.  If they guessed wrong, then then LRT would pick the next person to go.  Post-activity discussion reviewed benefits of positive recreation outlets: reducing stress, improving coping mechanisms, increasing self-esteem, and building larger support systems.   Affect/Mood: N/A   Participation Level: Did not attend    Clinical Observations/Individualized Feedback:     Plan: Continue to engage patient in RT group sessions 2-3x/week.   Caroll Rancher, LRT,CTRS  02/24/2021 12:05 PM

## 2021-02-25 DIAGNOSIS — F200 Paranoid schizophrenia: Secondary | ICD-10-CM | POA: Diagnosis not present

## 2021-02-25 LAB — GLUCOSE, CAPILLARY
Glucose-Capillary: 108 mg/dL — ABNORMAL HIGH (ref 70–99)
Glucose-Capillary: 148 mg/dL — ABNORMAL HIGH (ref 70–99)
Glucose-Capillary: 185 mg/dL — ABNORMAL HIGH (ref 70–99)
Glucose-Capillary: 324 mg/dL — ABNORMAL HIGH (ref 70–99)
Glucose-Capillary: 325 mg/dL — ABNORMAL HIGH (ref 70–99)

## 2021-02-25 NOTE — Progress Notes (Signed)
   02/25/21 2200  Psych Admission Type (Psych Patients Only)  Admission Status Involuntary  Psychosocial Assessment  Patient Complaints None  Eye Contact Fair  Facial Expression Animated  Affect Appropriate to circumstance  Speech Slow  Interaction Forwards little  Motor Activity Slow  Appearance/Hygiene Disheveled  Behavior Characteristics Cooperative  Mood Pleasant  Aggressive Behavior  Effect No apparent injury  Thought Process  Coherency WDL  Content WDL  Delusions None reported or observed  Perception WDL  Hallucination None reported or observed  Judgment WDL  Confusion None  Danger to Self  Current suicidal ideation? Denies  Danger to Others  Danger to Others None reported or observed

## 2021-02-25 NOTE — Group Note (Signed)
LCSW Group Therapy Note   1:15pm  Type of Therapy and Topic:  Group Therapy:  Identity and Relationships  Participation Level:  Minimal  Description of Group: Using the 'Ungame' patients were guided to express themselves about a variety of topics. Selected cards for this game included identity and relationships. Patients were able to discuss dealing with positive and negative situations, identifying supports, and other ways to understand your identity. Patients shared unique viewpoints but often had similar characteristics.  Patients encouraged to use this dialogue to develop goals and supports for future progress.  Therapeutic Goals: Patient will discuss 2 positive and 2 negative situations in their life prior to admission Patient will identify 2 positive support persons in their home environment Patient will explore setting goals for themselves and identify supports they need to achieve these goals Patient will demonstrate empathy for others in the group by responding with positive affirmations of group members shares.  Summary of Patient Progress: Pt attended and offered minimal participation. Pt shared that he does not care what others see in him. He states he likes to keep to himself.     Therapeutic Modalities: Motivational Interviewing Cognitive Behavioral Therapy   Ruthann Cancer MSW, LCSW Clincal Social Worker  [facility]

## 2021-02-25 NOTE — Progress Notes (Signed)
[facility] MD Progress Note  02/25/2021 12:40 PM NEVO KLAUSEN  MRN:  [id number] Subjective:   Mitchell [name] is a 60 year old male with a PPHx of Schizophrenia and multiple hospitalizations presents under IVC for acute psychosis.   Psychiatric Team made the following recommendations yesterday: -Continue Haldol 10 mg BID for psychosis  -Continue Clozapine 50 mg am/50 mg pm.    Case was discussed in the multidisciplinary team. MAR was reviewed and patient was compliant with medications. CSW discussed his outpatient follow up has been set up and will have [facility] referral put in. Patient has outpatient PCP follow up as well.  Patient was seen in the room and assessed on the unit.  On interview patient reports that he is "doing good".  He reports that he is sleeping good.  He reports his appetite is doing good.  He reports no suicidal ideation, homicidal ideation, or auditory visual hallucinations.   Patient denies any significant side effects from clozapine.  Patient was alert was alert and oriented to time, and place.  Patient insisted he is the president of the [place].  Discussed with patient that he will be tried on administering insulin himself prior to discharge. Patient verbalized understanding and states he would definitely be able to do it. Patient continues to pleasant and cooperative throughout assessment.  ROS for clozapine was reassessed today: Malaise: denies Chest pain: denies Shortness of breath: denies Exertional capacity: denies Tachycardia: denies Cough: denies Fever: denies Sedation: denies Orthostatic hypotension (dizziness with standing): denies Hypersalivation: denies Constipation: denies Symptoms of GERD: denies Nausea: denies Nocturnal enuresis: denies   Principal Problem: Schizophrenia ([place]) Diagnosis: Principal Problem:   Schizophrenia ([place]) Active Problems:   Type 2 diabetes mellitus ([place])   Past Psychiatric History: Schizophrenia and multiple  hospitalizations  Past Medical History:  Past Medical History:  Diagnosis Date   Bipolar affective disorder ([place])    takes Zyprexa daily   Diabetes mellitus    takes Victoza,Metformin,and Glipizide daily   Hypertension    takes Amlodipine,Lisinopril and Clonidine daily   Hyponatremia    history of   Mental disorder    takes Lithium daily   Schizoaffective disorder    takes Trazodone nightly   Seasonal allergies    takes Claritin daily   Sleep apnea    sleep study >75yrs ago   Stroke [facility])    left arm weakness    Past Surgical History:  Procedure Laterality Date   CATARACT EXTRACTION W/PHACO Right 02/14/2013   Procedure: CATARACT EXTRACTION PHACO AND INTRAOCULAR LENS PLACEMENT ([place]);  Surgeon: Adonis Brook, MD;  Location: [place];  Service: Ophthalmology;  Laterality: Right;   CATARACT EXTRACTION W/PHACO Left 06/13/2013   Procedure: CATARACT EXTRACTION PHACO AND INTRAOCULAR LENS PLACEMENT ([facility]);  Surgeon: Adonis Brook, MD;  Location: [place];  Service: Ophthalmology;  Laterality: Left;   CIRCUMCISION  20 yrs. ago   EYE SURGERY     Family History: History reviewed. No pertinent family history. Family Psychiatric  History: BAPD Social History:  Social History   Substance and Sexual Activity  Alcohol Use Not Currently     Social History   Substance and Sexual Activity  Drug Use No    Social History   Socioeconomic History   Marital status: Divorced    Spouse name: Not on file   Number of children: Not on file   Years of education: Not on file   Highest education level: Not on file  Occupational History   Not on file  Tobacco  Use   Smoking status: Never   Smokeless tobacco: Never  Vaping Use   Vaping Use: Never used  Substance and Sexual Activity   Alcohol use: Not Currently   Drug use: No   Sexual activity: Yes    Birth control/protection: None  Other Topics Concern   Not on file  Social History Narrative   Not on file   Social Determinants of Health    Financial Resource Strain: Not on file  Food Insecurity: Not on file  Transportation Needs: Not on file  Physical Activity: Not on file  Stress: Not on file  Social Connections: Not on file   Additional Social History:                         Sleep: Good  Appetite:  Good  Current Medications: Current Facility-Administered Medications  Medication Dose Route Frequency Provider Last Rate Last Admin   acetaminophen (TYLENOL) tablet 650 mg  650 mg Oral Q6H PRN Chalmers Guest, NP       alum & mag hydroxide-simeth (MAALOX/MYLANTA) 200-200-20 MG/5ML suspension 30 mL  30 mL Oral Q4H PRN Chalmers Guest, NP       amLODipine (NORVASC) tablet 10 mg  10 mg Oral Daily Chalmers Guest, NP   10 mg at 02/25/21 [id number]   aspirin EC tablet 81 mg  81 mg Oral Daily Chalmers Guest, NP   81 mg at 02/25/21 [id number]   atenolol (TENORMIN) tablet 12.5 mg  12.5 mg Oral Daily France Ravens, MD   12.5 mg at 02/25/21 [id number]   benztropine (COGENTIN) tablet 1 mg  1 mg Oral BID Chalmers Guest, NP   1 mg at 02/25/21 0813   cloNIDine (CATAPRES) tablet 0.2 mg  0.2 mg Oral BID Mass[place]ill, Ovid Curd, MD   0.2 mg at 02/25/21 [id number]   cloZAPine (CLOZARIL) tablet 50 mg  50 mg Oral BID France Ravens, MD   50 mg at 02/25/21 [id number]   glipiZIDE (GLUCOTROL XL) 24 hr tablet 20 mg  20 mg Oral Q breakfast France Ravens, MD   20 mg at 02/25/21 [id number]   haloperidol (HALDOL) tablet 10 mg  10 mg Oral BID France Ravens, MD   10 mg at 02/25/21 [id number]   insulin aspart (novoLOG) injection 0-15 Units  0-15 Units Subcutaneous TID WC Maida Sale, MD   2 Units at 02/25/21 [id number]   insulin aspart (novoLOG) injection 0-5 Units  0-5 Units Subcutaneous QHS Chalmers Guest, NP   3 Units at 02/24/21 2043   insulin aspart protamine- aspart (NOVOLOG MIX 70/30) injection 42 Units  42 Units Subcutaneous BID WC France Ravens, MD   42 Units at 02/25/21 0813   lisinopril (ZESTRIL) tablet 40 mg  40 mg Oral Daily Chalmers Guest, NP   40 mg at 02/25/21 0811   lithium carbonate  (ESKALITH) CR tablet 450 mg  450 mg Oral Q12H Chalmers Guest, NP   450 mg at 02/25/21 [id number]   magnesium hydroxide (MILK OF MAGNESIA) suspension 30 mL  30 mL Oral Daily PRN Chalmers Guest, NP       OLANZapine zydis (ZYPREXA) disintegrating tablet 10 mg  10 mg Oral Q8H PRN Chalmers Guest, NP   10 mg at 02/03/21 2342   And   ziprasidone (GEODON) injection 20 mg  20 mg Intramuscular PRN Chalmers Guest, NP       polyethylene glycol (MIRALAX / GLYCOLAX) packet 17 g  17 g Oral Daily PRN Laveda Abbe, NP       senna-docusate (Senokot-S) tablet 1 tablet  1 tablet Oral Daily Carlyn Reichert, MD   1 tablet at 02/25/21 8088   traZODone (DESYREL) tablet 50 mg  50 mg Oral Seabron Spates, MD   50 mg at 02/24/21 2042    Lab Results:  Results for orders placed or performed during the hospital encounter of 01/28/21 (from the past 48 hour(s))  Glucose, capillary     Status: Abnormal   Collection Time: 02/23/21  5:01 PM  Result Value Ref Range   Glucose-Capillary 149 (H) 70 - 99 mg/dL    Comment: Glucose reference range applies only to samples taken after fasting for at least 8 hours.  Glucose, capillary     Status: Abnormal   Collection Time: 02/23/21  7:29 PM  Result Value Ref Range   Glucose-Capillary 145 (H) 70 - 99 mg/dL    Comment: Glucose reference range applies only to samples taken after fasting for at least 8 hours.  Glucose, capillary     Status: Abnormal   Collection Time: 02/24/21  5:42 AM  Result Value Ref Range   Glucose-Capillary 229 (H) 70 - 99 mg/dL    Comment: Glucose reference range applies only to samples taken after fasting for at least 8 hours.  Glucose, capillary     Status: Abnormal   Collection Time: 02/24/21 11:44 AM  Result Value Ref Range   Glucose-Capillary 208 (H) 70 - 99 mg/dL    Comment: Glucose reference range applies only to samples taken after fasting for at least 8 hours.  Glucose, capillary     Status: Abnormal   Collection Time: 02/24/21  5:10 PM  Result  Value Ref Range   Glucose-Capillary 299 (H) 70 - 99 mg/dL    Comment: Glucose reference range applies only to samples taken after fasting for at least 8 hours.  Glucose, capillary     Status: Abnormal   Collection Time: 02/24/21  7:39 PM  Result Value Ref Range   Glucose-Capillary 267 (H) 70 - 99 mg/dL    Comment: Glucose reference range applies only to samples taken after fasting for at least 8 hours.  Glucose, capillary     Status: Abnormal   Collection Time: 02/25/21  5:10 AM  Result Value Ref Range   Glucose-Capillary 108 (H) 70 - 99 mg/dL    Comment: Glucose reference range applies only to samples taken after fasting for at least 8 hours.  Glucose, capillary     Status: Abnormal   Collection Time: 02/25/21  6:05 AM  Result Value Ref Range   Glucose-Capillary 148 (H) 70 - 99 mg/dL    Comment: Glucose reference range applies only to samples taken after fasting for at least 8 hours.  Glucose, capillary     Status: Abnormal   Collection Time: 02/25/21 11:57 AM  Result Value Ref Range   Glucose-Capillary 185 (H) 70 - 99 mg/dL    Comment: Glucose reference range applies only to samples taken after fasting for at least 8 hours.    Blood Alcohol level:  Lab Results  Component Value Date   ETH <10 01/26/2021   ETH <10 01/21/2021    Metabolic Disorder Labs: Lab Results  Component Value Date   HGBA1C 11.1 (H) 01/26/2021   MPG 271.87 01/26/2021   MPG 202.99 01/07/2019   No results found for: PROLACTIN Lab Results  Component Value Date   CHOL 140 01/30/2021  TRIG 95 01/30/2021   HDL 49 01/30/2021   CHOLHDL 2.9 01/30/2021   VLDL 19 01/30/2021   LDLCALC 72 01/30/2021   LDLCALC 67 05/20/2020    Physical Findings:   Musculoskeletal: Strength & Muscle Tone: within normal limits Gait & Station: normal Patient leans: N/A  Psychiatric Specialty Exam:  Presentation  General Appearance: Casual Eye Contact:Fair  Speech:Clear and Coherent; Normal Rate  Speech  Volume:Normal  Handedness:Right   Mood and Affect  Mood:Euthymic  Affect:Congruent   Thought Process  Thought Processes:Coherent  Descriptions of Associations:Intact  Orientation:Full (Time, Place and Person)  Thought Content:continues to have grandiose delusions and possible paranoid delusions regarding name  History of Schizophrenia/Schizoaffective disorder:Yes  Duration of Psychotic Symptoms:Greater than six months  Hallucinations: none Ideas of Reference:None  Suicidal Thoughts: none Homicidal Thoughts:none  Sensorium  Memory:Immediate Fair; Recent Fair  Judgment:Fair  Insight:Fair   Executive Functions  Concentration:Fair  Attention Span:Fair  Recall:Fair  Fund of Knowledge:Fair  Language:Good   Psychomotor Activity  Psychomotor Activity: normal  Assets  Assets:Communication Skills; Resilience   Sleep  Sleep:No data recorded   Physical Exam: Physical Exam Vitals and nursing note reviewed.  Constitutional:      General: He is not in acute distress.    Appearance: Normal appearance. He is obese. He is not ill-appearing or toxic-appearing.  HENT:     Head: Normocephalic and atraumatic.  Pulmonary:     Effort: Pulmonary effort is normal.  Musculoskeletal:        General: Normal range of motion.  Neurological:     General: No focal deficit present.     Mental Status: He is alert.   Review of Systems  Respiratory:  Negative for cough and shortness of breath.   Cardiovascular:  Negative for chest pain.  Gastrointestinal:  Negative for abdominal pain, constipation, diarrhea, nausea and vomiting.  Neurological:  Negative for weakness and headaches.  Psychiatric/Behavioral:  Negative for depression, hallucinations and suicidal ideas. The patient is not nervous/anxious.    Blood pressure 121/73, pulse 97, temperature 98.5 F (36.9 C), temperature source Oral, resp. rate 16, height 5' 3.5" (1.613 m), weight 91.6 kg, SpO2 100 %. Body mass  index is 35.22 kg/m.   Treatment Plan Summary: Daily contact with patient to assess and evaluate symptoms and progress in treatment and Medication management  Rodman Recupero is a 60 year old male with a PPHx of Schizophrenia and multiple hospitalizations presents under IVC for acute psychosis.   Patient appears less sedated today. Referrals to [facility] is pending per CSW to ensure he has support to safely return to his apartment.  Otherwise, we will need to consider recommending group home given patient's diabetes and CBG ranging from 150s to 400s. We will not make any medication changes at this time.  We will continue to monitor.    Schizophrenia: -Continue Haldol 10 mg BID -Continue Clozaril 50 mg BID -Continue Lithium 450 mg BID (Lithium level 0.70 02/23/2021) -Continue Senokot-S daily -Continue Cogentin 1 mg BID for EPS prophylaxis -Continue Atenolol 12.5 mg daily -Clozaril labs 11/28 completed (next set due 12/5) -CBC on day of discharge  HTN: -Continue Lisinopril 40 mg daily -Continue Amlodipine 10 mg daily -Continue Clonidine 0.2 mg BID   Type 2 Diabetes: -Per Diabetic Coordinator, plan to adjust insulin coverage to account for discharge. Appreciate recs  -Discontinue Semglee, and Novolog meal coverage -Continue 70/30 Insulin 42 units bid with meals  -Continue Glipizide 20 mg daily starting tomorrow 11/30 -Continue Novlog 0-15 units tid for meals and  nighttime coverage   ABD distention, transaminitis: According staff this has increased since admission. KUB shows non-obstructive gas pattern with no obvious stool burden. ABD exam benign except for protuberant abdomen. ALT persistently elevated to 62. Concern was for ascites despite negative fluid wave. RUQ [place] negative for hepatic parenchymal disease or biliary disease. For ascites causes, ruled out parenchymal disease of the liver, CHF (BNP WNL), and patient likely does not have cancer. Ammonia WNL. Lipase slightly elevated.   -Hepatitis panel negative  -Continue PRN's: Tylenol, Maalox, Atarax, Milk of Magnesia, Trazodone   France Ravens, MD 02/25/2021, 12:40 PM

## 2021-02-25 NOTE — Group Note (Signed)
Recreation Therapy Group Note   Group Topic:Self-Esteem  Group Date: 02/25/2021 Start Time: 1000 End Time: 1040 Facilitators: Caroll Rancher, LRT,CTRS Location: [address] Dayroom   Goal Area(s) Addresses:  Patient will appropriately identify what self esteem is.  Patient will create a shield of armor describing themselves.  Patient will successfully identify positive attributes about themselves.  Patient will acknowledge benefit of improved self-esteem.    Group Description: Self-Esteem Shield. Patient attended a recreation therapy group session focused on self esteem. Patient identified what self esteem is, and why it is important to have high self esteem during group discussion. LRT wrote on the white board, drawing the outline of the shield and labeling the quadrants.  Patient was asked to create their own shield to show off their unique attributes, four quadrants reflected the following:  The Upper Left quadrant- what makes them unique The Upper Right quadrant- things that they love to do. The Lower Left quadrant- words they live by The Lower Right quadrant- character words that describe them.    Patients were provided sheets with the shield printed on them and colored pencils, markers and crayons to complete the activity.  Patients and writer had group related discussions while individually working on their activity.    Affect/Mood: N/A   Participation Level: Did not attend    Clinical Observations/Individualized Feedback:      Plan: Continue to engage patient in RT group sessions 2-3x/week.   Caroll Rancher, LRT,CTRS 02/25/2021 11:53 AM

## 2021-02-25 NOTE — BHH Group Notes (Signed)
Pt was encouraged, but did not attend orientation group.  

## 2021-02-25 NOTE — BHH Group Notes (Signed)
Pt was encouraged, but did not attend relaxation group.  

## 2021-02-26 LAB — COMPREHENSIVE METABOLIC PANEL
ALT: 53 U/L — ABNORMAL HIGH (ref 0–44)
AST: 35 U/L (ref 15–41)
Albumin: 3.8 g/dL (ref 3.5–5.0)
Alkaline Phosphatase: 145 U/L — ABNORMAL HIGH (ref 38–126)
Anion gap: 7 (ref 5–15)
BUN: 22 mg/dL — ABNORMAL HIGH (ref 6–20)
CO2: 25 mmol/L (ref 22–32)
Calcium: 9 mg/dL (ref 8.9–10.3)
Chloride: 100 mmol/L (ref 98–111)
Creatinine, Ser: 1.13 mg/dL (ref 0.61–1.24)
GFR, Estimated: >60 mL/min (ref >60)
Glucose, Bld: 201 mg/dL — ABNORMAL HIGH (ref 70–99)
Potassium: 4.3 mmol/L (ref 3.5–5.1)
Sodium: 132 mmol/L — ABNORMAL LOW (ref 135–145)
Total Bilirubin: 0.5 mg/dL (ref 0.3–1.2)
Total Protein: 7.7 g/dL (ref 6.5–8.1)

## 2021-02-26 LAB — CBC
HCT: 38.7 % — ABNORMAL LOW (ref 39.0–52.0)
Hemoglobin: 12.9 g/dL — ABNORMAL LOW (ref 13.0–17.0)
MCH: 30.9 pg (ref 26.0–34.0)
MCHC: 33.3 g/dL (ref 30.0–36.0)
MCV: 92.8 fL (ref 80.0–100.0)
Platelets: 262 10*3/uL (ref 150–400)
RBC: 4.17 MIL/uL — ABNORMAL LOW (ref 4.22–5.81)
RDW: 12.3 % (ref 11.5–15.5)
WBC: 7.1 10*3/uL (ref 4.0–10.5)
nRBC: 0 % (ref 0.0–0.2)

## 2021-02-26 LAB — GLUCOSE, CAPILLARY
Glucose-Capillary: 161 mg/dL — ABNORMAL HIGH (ref 70–99)
Glucose-Capillary: 229 mg/dL — ABNORMAL HIGH (ref 70–99)
Glucose-Capillary: 301 mg/dL — ABNORMAL HIGH (ref 70–99)
Glucose-Capillary: 298 mg/dL — ABNORMAL HIGH (ref 70–99)

## 2021-02-26 NOTE — Progress Notes (Signed)
Inpatient Diabetes Program Recommendations  AACE/ADA: New Consensus Statement on Inpatient Glycemic Control (2015)  Target Ranges:  Prepandial:   less than 140 mg/dL      Peak postprandial:   less than 180 mg/dL (1-2 hours)      Critically ill patients:  140 - 180 mg/dL    Review of Glycemic Control  Latest Reference Range & Units 02/25/21 05:10 02/25/21 06:05 02/25/21 11:57 02/25/21 16:56 02/25/21 20:07 02/26/21 06:04  Glucose-Capillary 70 - 99 mg/dL 196 (H) 222 (H) 979 (H) 324 (H) 325 (H) 161 (H)   Current Orders: 70/30 42 units bid Novolog 0-15 units TID ac/hs  Glipizide 20 mg daily   - Increase 70/30 am dose to 48 units  Pm dose to remain at 42 units  Thanks, Christena Deem RN, MSN, BC-ADM Inpatient Diabetes Coordinator Team Pager [phone] (8a-5p)

## 2021-02-26 NOTE — BHH Group Notes (Signed)
Orientation group and PsychoED group. Patients were given a poem by Buddist Philosopher [place] regarding the power of our thoughts. Patients were then given examples of how positive reframing can help shape our thoughts and impact our mood. Patients were then asked to explore one negative thought they would like to let go of. Patient did attend but thoughts were disorganized and patient could not follow prompts for group with moderate redirection.

## 2021-02-26 NOTE — Group Note (Signed)
Recreation Therapy Group Note   Group Topic:Personal Development  Group Date: 02/26/2021 Start Time: 1010 End Time: 1030 Facilitators: Caroll Rancher, LRT,CTRS Location: [address] Dayroom  Goal Area(s) Addresses:  Patient will successfully identify benefit of life skills. Patient will successfully identify how life skills can be used post discharge.    Group Description: Patients and LRT played a game similar to [place] called Totika.  The game consisted of 48 color coded blocks (orange, blue, red, green).  Patients were to pull a block from the stack and place on top.  Whatever color block patient picked, they would be asked a question dealing with life skills that corresponds to that color.  If the tower falls over, it would be re stacked and another round would be played.    Affect/Mood: Flat   Participation Level: None   Participation Quality: None   Behavior: Withdrawn   Speech/Thought Process: None   Insight: None   Judgement: None   Modes of Intervention: Guided Discussion   Patient Response to Interventions:  Disengaged   Education Outcome:  Acknowledges education and In group clarification offered    Clinical Observations/Individualized Feedback: Despite being asked to participate, pt refused.  Pt sat by the phone, observed for a little while before falling asleep.    Plan: Continue to engage patient in RT group sessions 2-3x/week.   Caroll Rancher, LRT,CTRS 02/26/2021 12:01 PM

## 2021-02-26 NOTE — BHH Group Notes (Signed)
CT was in a good mood and said he had an amazing day. CT said he spoke with the doctor and he would be going home next week. CT said he would like to work on staying regulated in his medication.

## 2021-02-26 NOTE — BHH Counselor (Signed)
CSW attempted to reach patient case manager, Claudell Kyle [phone]) to discuss discharge.    Ruthann Cancer MSW, LCSW Clincal Social Worker  [facility]

## 2021-02-26 NOTE — Plan of Care (Signed)
  Problem: Health Behavior/Discharge Planning: Goal: Compliance with treatment plan for underlying cause of condition will improve Outcome: Progressing   Problem: Physical Regulation: Goal: Ability to maintain clinical measurements within normal limits will improve Outcome: Progressing   Problem: Safety: Goal: Periods of time without injury will increase Outcome: Progressing   

## 2021-02-26 NOTE — BHH Counselor (Signed)
CSW spoke with Orson Ape [phone]) from [facility]. She will come to visit this pt the day he is discharged and will be his contact for [facility] for appointments and extra support.   CSW attempted to reach [facility] [phone]) with [facility] to provide this additional information.     Ruthann Cancer MSW, LCSW Clincal Social Worker  [facility]

## 2021-02-26 NOTE — Progress Notes (Signed)
Progress note    02/26/21 0750  Psych Admission Type (Psych Patients Only)  Admission Status Involuntary  Psychosocial Assessment  Patient Complaints None  Eye Contact Fair  Facial Expression Animated  Affect Appropriate to circumstance  Speech Logical/coherent  Interaction Assertive  Motor Activity Slow  Appearance/Hygiene Disheveled;Poor hygiene  Behavior Characteristics Cooperative;Appropriate to situation  Mood Pleasant  Thought Process  Coherency WDL  Content WDL  Delusions WDL  Perception WDL  Hallucination None reported or observed  Judgment WDL  Confusion None  Danger to Self  Current suicidal ideation? Denies  Danger to Others  Danger to Others None reported or observed

## 2021-02-26 NOTE — Progress Notes (Addendum)
[facility] MD Progress Note  02/26/2021 1:18 PM Mitchell [name]  MRN:  [id number] Subjective:   Mitchell [name] is a 60 year old male with a PPHx of Schizophrenia and multiple hospitalizations presents under IVC for acute psychosis.   Psychiatric Team made the following recommendations yesterday: -Continue Haldol 10 mg BID for psychosis  -Continue Clozapine 50 mg am/50 mg pm.    Case was discussed in the multidisciplinary team. MAR was reviewed and patient was compliant with medications. CSW discussed his outpatient follow up has been set up and will have [facility]ACE referral put in. Patient has outpatient PCP follow up as well.  Patient was seen in the room and assessed on the unit.  On interview patient reports that he is "doing good".  He reports that he is sleeping good.  He reports his appetite is doing good.  He reports no suicidal ideation, homicidal ideation, or auditory visual hallucinations.   Patient denies any significant side effects from clozapine.  Patient was alert was alert and oriented to time, and place.  Patient insisted he is the president of the [place].  Discussed with patient that he will be tried on administering insulin himself prior to discharge. Patient verbalized understanding and states he has been successfully self administering insulin. Patient continues to pleasant and cooperative throughout assessment.  ROS for clozapine was reassessed today: Malaise: denies Chest pain: denies Shortness of breath: denies Exertional capacity: denies Tachycardia: denies Cough: denies Fever: denies Sedation: denies Orthostatic hypotension (dizziness with standing): denies Hypersalivation: denies Constipation: denies Symptoms of GERD: denies Nausea: denies Nocturnal enuresis: denies   Principal Problem: Schizophrenia ([place]) Diagnosis: Principal Problem:   Schizophrenia ([place]) Active Problems:   Type 2 diabetes mellitus ([place])   Past Psychiatric History: Schizophrenia and  multiple hospitalizations  Past Medical History:  Past Medical History:  Diagnosis Date   Bipolar affective disorder ([place])    takes Zyprexa daily   Diabetes mellitus    takes Victoza,Metformin,and Glipizide daily   Hypertension    takes Amlodipine,Lisinopril and Clonidine daily   Hyponatremia    history of   Mental disorder    takes Lithium daily   Schizoaffective disorder    takes Trazodone nightly   Seasonal allergies    takes Claritin daily   Sleep apnea    sleep study >72yrs ago   Stroke [facility])    left arm weakness    Past Surgical History:  Procedure Laterality Date   CATARACT EXTRACTION W/PHACO Right 02/14/2013   Procedure: CATARACT EXTRACTION PHACO AND INTRAOCULAR LENS PLACEMENT ([place]);  Surgeon: Adonis Brook, MD;  Location: [place];  Service: Ophthalmology;  Laterality: Right;   CATARACT EXTRACTION W/PHACO Left 06/13/2013   Procedure: CATARACT EXTRACTION PHACO AND INTRAOCULAR LENS PLACEMENT ([facility]);  Surgeon: Adonis Brook, MD;  Location: [place];  Service: Ophthalmology;  Laterality: Left;   CIRCUMCISION  20 yrs. ago   EYE SURGERY     Family History: History reviewed. No pertinent family history. Family Psychiatric  History: BAPD Social History:  Social History   Substance and Sexual Activity  Alcohol Use Not Currently     Social History   Substance and Sexual Activity  Drug Use No    Social History   Socioeconomic History   Marital status: Divorced    Spouse name: Not on file   Number of children: Not on file   Years of education: Not on file   Highest education level: Not on file  Occupational History   Not on file  Tobacco Use  Smoking status: Never   Smokeless tobacco: Never  Vaping Use   Vaping Use: Never used  Substance and Sexual Activity   Alcohol use: Not Currently   Drug use: No   Sexual activity: Yes    Birth control/protection: None  Other Topics Concern   Not on file  Social History Narrative   Not on file   Social Determinants of  Health   Financial Resource Strain: Not on file  Food Insecurity: Not on file  Transportation Needs: Not on file  Physical Activity: Not on file  Stress: Not on file  Social Connections: Not on file   Additional Social History:                         Sleep: Good  Appetite:  Good  Current Medications: Current Facility-Administered Medications  Medication Dose Route Frequency Provider Last Rate Last Admin   acetaminophen (TYLENOL) tablet 650 mg  650 mg Oral Q6H PRN Chalmers Guest, NP       alum & mag hydroxide-simeth (MAALOX/MYLANTA) 200-200-20 MG/5ML suspension 30 mL  30 mL Oral Q4H PRN Chalmers Guest, NP       amLODipine (NORVASC) tablet 10 mg  10 mg Oral Daily Chalmers Guest, NP   10 mg at 02/26/21 0749   aspirin EC tablet 81 mg  81 mg Oral Daily Chalmers Guest, NP   81 mg at 02/26/21 0749   atenolol (TENORMIN) tablet 12.5 mg  12.5 mg Oral Daily France Ravens, MD   12.5 mg at 02/26/21 0748   benztropine (COGENTIN) tablet 1 mg  1 mg Oral BID Chalmers Guest, NP   1 mg at 02/26/21 [id number]   cloNIDine (CATAPRES) tablet 0.2 mg  0.2 mg Oral BID Massengill, Ovid Curd, MD   0.2 mg at 02/26/21 0749   cloZAPine (CLOZARIL) tablet 50 mg  50 mg Oral BID France Ravens, MD   50 mg at 02/26/21 0749   glipiZIDE (GLUCOTROL XL) 24 hr tablet 20 mg  20 mg Oral Q breakfast France Ravens, MD   20 mg at 02/26/21 0749   haloperidol (HALDOL) tablet 10 mg  10 mg Oral BID France Ravens, MD   10 mg at 02/26/21 0749   insulin aspart (novoLOG) injection 0-15 Units  0-15 Units Subcutaneous TID WC Hill, Jackie Plum, MD   5 Units at 02/26/21 1201   insulin aspart (novoLOG) injection 0-5 Units  0-5 Units Subcutaneous QHS Chalmers Guest, NP   4 Units at 02/25/21 2048   insulin aspart protamine- aspart (NOVOLOG MIX 70/30) injection 42 Units  42 Units Subcutaneous BID WC France Ravens, MD   42 Units at 02/26/21 0749   lisinopril (ZESTRIL) tablet 40 mg  40 mg Oral Daily Chalmers Guest, NP   40 mg at 02/26/21 0748   lithium  carbonate (ESKALITH) CR tablet 450 mg  450 mg Oral Q12H Chalmers Guest, NP   450 mg at 02/26/21 0749   magnesium hydroxide (MILK OF MAGNESIA) suspension 30 mL  30 mL Oral Daily PRN Chalmers Guest, NP       OLANZapine zydis (ZYPREXA) disintegrating tablet 10 mg  10 mg Oral Q8H PRN Chalmers Guest, NP   10 mg at 02/03/21 2342   And   ziprasidone (GEODON) injection 20 mg  20 mg Intramuscular PRN Chalmers Guest, NP       polyethylene glycol (MIRALAX / GLYCOLAX) packet 17 g  17 g Oral  Daily PRN Laveda Abbe, NP       senna-docusate (Senokot-S) tablet 1 tablet  1 tablet Oral Daily Carlyn Reichert, MD   1 tablet at 02/26/21 0749   traZODone (DESYREL) tablet 50 mg  50 mg Oral Seabron Spates, MD   50 mg at 02/25/21 2047    Lab Results:  Results for orders placed or performed during the hospital encounter of 01/28/21 (from the past 48 hour(s))  Glucose, capillary     Status: Abnormal   Collection Time: 02/24/21  5:10 PM  Result Value Ref Range   Glucose-Capillary 299 (H) 70 - 99 mg/dL    Comment: Glucose reference range applies only to samples taken after fasting for at least 8 hours.  Glucose, capillary     Status: Abnormal   Collection Time: 02/24/21  7:39 PM  Result Value Ref Range   Glucose-Capillary 267 (H) 70 - 99 mg/dL    Comment: Glucose reference range applies only to samples taken after fasting for at least 8 hours.  Glucose, capillary     Status: Abnormal   Collection Time: 02/25/21  5:10 AM  Result Value Ref Range   Glucose-Capillary 108 (H) 70 - 99 mg/dL    Comment: Glucose reference range applies only to samples taken after fasting for at least 8 hours.  Glucose, capillary     Status: Abnormal   Collection Time: 02/25/21  6:05 AM  Result Value Ref Range   Glucose-Capillary 148 (H) 70 - 99 mg/dL    Comment: Glucose reference range applies only to samples taken after fasting for at least 8 hours.  Glucose, capillary     Status: Abnormal   Collection Time: 02/25/21 11:57 AM   Result Value Ref Range   Glucose-Capillary 185 (H) 70 - 99 mg/dL    Comment: Glucose reference range applies only to samples taken after fasting for at least 8 hours.  Glucose, capillary     Status: Abnormal   Collection Time: 02/25/21  4:56 PM  Result Value Ref Range   Glucose-Capillary 324 (H) 70 - 99 mg/dL    Comment: Glucose reference range applies only to samples taken after fasting for at least 8 hours.  Glucose, capillary     Status: Abnormal   Collection Time: 02/25/21  8:07 PM  Result Value Ref Range   Glucose-Capillary 325 (H) 70 - 99 mg/dL    Comment: Glucose reference range applies only to samples taken after fasting for at least 8 hours.  Glucose, capillary     Status: Abnormal   Collection Time: 02/26/21  6:04 AM  Result Value Ref Range   Glucose-Capillary 161 (H) 70 - 99 mg/dL    Comment: Glucose reference range applies only to samples taken after fasting for at least 8 hours.  CBC     Status: Abnormal   Collection Time: 02/26/21  6:41 AM  Result Value Ref Range   WBC 7.1 4.0 - 10.5 K/uL   RBC 4.17 (L) 4.22 - 5.81 MIL/uL   Hemoglobin 12.9 (L) 13.0 - 17.0 g/dL   HCT 26.9 (L) 48.5 - 46.2 %   MCV 92.8 80.0 - 100.0 fL   MCH 30.9 26.0 - 34.0 pg   MCHC 33.3 30.0 - 36.0 g/dL   RDW 70.3 50.0 - 93.8 %   Platelets 262 150 - 400 K/uL   nRBC 0.0 0.0 - 0.2 %    Comment: Performed at [facility], [address], [place], [place] [address]  Comprehensive  metabolic panel     Status: Abnormal   Collection Time: 02/26/21  6:41 AM  Result Value Ref Range   Sodium 132 (L) 135 - 145 mmol/L   Potassium 4.3 3.5 - 5.1 mmol/L   Chloride 100 98 - 111 mmol/L   CO2 25 22 - 32 mmol/L   Glucose, Bld 201 (H) 70 - 99 mg/dL    Comment: Glucose reference range applies only to samples taken after fasting for at least 8 hours.   BUN 22 (H) 6 - 20 mg/dL   Creatinine, Ser 1.13 0.61 - 1.24 mg/dL   Calcium 9.0 8.9 - 10.3 mg/dL   Total Protein 7.7 6.5 - 8.1 g/dL   Albumin 3.8  3.5 - 5.0 g/dL   AST 35 15 - 41 U/L   ALT 53 (H) 0 - 44 U/L   Alkaline Phosphatase 145 (H) 38 - 126 U/L   Total Bilirubin 0.5 0.3 - 1.2 mg/dL   GFR, Estimated >60 >60 mL/min    Comment: (NOTE) Calculated using the CKD-EPI Creatinine Equation (2021)    Anion gap 7 5 - 15    Comment: Performed at [facility], [place] [address] [place], [place] [address]  Glucose, capillary     Status: Abnormal   Collection Time: 02/26/21 11:51 AM  Result Value Ref Range   Glucose-Capillary 229 (H) 70 - 99 mg/dL    Comment: Glucose reference range applies only to samples taken after fasting for at least 8 hours.    Blood Alcohol level:  Lab Results  Component Value Date   ETH <10 01/26/2021   ETH <10 [id number]    Metabolic Disorder Labs: Lab Results  Component Value Date   HGBA1C 11.1 (H) 01/26/2021   MPG 271.87 01/26/2021   MPG 202.99 01/07/2019   No results found for: PROLACTIN Lab Results  Component Value Date   CHOL 140 01/30/2021   TRIG 95 01/30/2021   HDL 49 01/30/2021   CHOLHDL 2.9 01/30/2021   VLDL 19 01/30/2021   LDLCALC 72 01/30/2021   LDLCALC 67 05/20/2020    Physical Findings:   Musculoskeletal: Strength & Muscle Tone: within normal limits Gait & Station: normal Patient leans: N/A  Psychiatric Specialty Exam:  Presentation  General Appearance: Casual Eye Contact:Fair  Speech:Clear and Coherent; Normal Rate  Speech Volume:Normal  Handedness:Right   Mood and Affect  Mood:Euthymic  Affect:Congruent   Thought Process  Thought Processes:Coherent  Descriptions of Associations:Intact  Orientation:Full (Time, Place and Person)  Thought Content:continues to have grandiose delusions and possible paranoid delusions regarding name  History of Schizophrenia/Schizoaffective disorder:Yes  Duration of Psychotic Symptoms:Greater than six months  Hallucinations: none Ideas of Reference:None  Suicidal Thoughts: none Homicidal  Thoughts:none  Sensorium  Memory:Immediate Fair; Recent Fair  Judgment:Fair  Insight:Fair   Executive Functions  Concentration:Fair  Attention Span:Fair  [place]  Language:Good   Psychomotor Activity  Psychomotor Activity: normal  Assets  Assets:Communication Skills; Resilience   Sleep  Sleep:No data recorded   Physical Exam: Physical Exam Vitals and nursing note reviewed.  Constitutional:      General: He is not in acute distress.    Appearance: Normal appearance. He is obese. He is not ill-appearing or toxic-appearing.  HENT:     Head: Normocephalic and atraumatic.  Pulmonary:     Effort: Pulmonary effort is normal.  Musculoskeletal:        General: Normal range of motion.  Neurological:     General: No focal deficit  present.     Mental Status: He is alert.   Review of Systems  Respiratory:  Negative for cough and shortness of breath.   Cardiovascular:  Negative for chest pain.  Gastrointestinal:  Negative for abdominal pain, constipation, diarrhea, nausea and vomiting.  Neurological:  Negative for weakness and headaches.  Psychiatric/Behavioral:  Negative for depression, hallucinations and suicidal ideas. The patient is not nervous/anxious.    Blood pressure 125/82, pulse 99, temperature 98.4 F (36.9 C), temperature source Oral, resp. rate 16, height 5' 3.5" (1.613 m), weight 91.6 kg, SpO2 98 %. Body mass index is 35.22 kg/m.   Treatment Plan Summary: Daily contact with patient to assess and evaluate symptoms and progress in treatment and Medication management  Elvir Witczak is a 60 year old male with a PPHx of Schizophrenia and multiple hospitalizations presents under IVC for acute psychosis.   Patient appears less sedated today. Referrals to [facility] is pending per CSW to ensure he has support to safely return to his apartment.  Patient will have a home appointment early next week.  Otherwise, we will need to consider  recommending group home given patient's diabetes and CBG ranging from 150s to 400s. We will not make any medication changes at this time.  We will continue to monitor.  Plan for discharge Monday.   Schizophrenia: -Continue Haldol 10 mg BID -Continue Clozaril 50 mg BID -Continue Lithium 450 mg BID (Lithium level 0.70 02/23/2021) -Continue Senokot-S daily -Continue Cogentin 1 mg BID for EPS prophylaxis -Continue Atenolol 12.5 mg daily -Clozaril labs 11/28 completed (next set due 12/5) -CBC on day of discharge  HTN: -Continue Lisinopril 40 mg daily -Continue Amlodipine 10 mg daily -Continue Clonidine 0.2 mg BID   Type 2 Diabetes: -Per Diabetic Coordinator, plan to adjust insulin coverage to account for discharge. Appreciate recs  -Discontinue Semglee, and Novolog meal coverage -Continue 70/30 Insulin 42 units bid with meals  -Continue Glipizide 20 mg daily starting tomorrow 11/30 -Continue Novlog 0-15 units tid for meals and nighttime coverage   ABD distention, transaminitis: According staff this has increased since admission. KUB shows non-obstructive gas pattern with no obvious stool burden. ABD exam benign except for protuberant abdomen. ALT persistently elevated to 62. Concern was for ascites despite negative fluid wave. RUQ [place] negative for hepatic parenchymal disease or biliary disease. For ascites causes, ruled out parenchymal disease of the liver, CHF (BNP WNL), and patient likely does not have cancer. Ammonia WNL. Lipase slightly elevated.  -Hepatitis panel negative  -Continue PRN's: Tylenol, Maalox, Atarax, Milk of Magnesia, Trazodone   France Ravens, MD 02/26/2021, 1:18 PM

## 2021-02-26 NOTE — Group Note (Signed)
Occupational Therapy Group Note  Group Topic:Feelings Management  Group Date: 02/26/2021 Start Time: 1400 End Time: 1445 Facilitators: Donne Hazel, OT/L    Group Description: Group encouraged increased engagement and participation through discussion focused on coping through music. Group members were encouraged to create a "coping skills playlist" that consisted of 20 songs with different cited categories/topics including "a song that reminds you of a good memory" and "a song that makes you want to dance", etc. Discussion followed with patients sharing their playlists and choosing one for the group to listen and respond too.   Therapeutic Goal(s): Identify positive songs to improve coping through music Identify and share benefit of music as a coping strategy   Participation Level: Active   Participation Quality: Minimal Cues   Behavior: Calm and Cooperative   Speech/Thought Process: Focused   Affect/Mood: Sedated   Insight: Limited   Judgement: Limited   Individualization: Mitchell [name] was active in their participation of group discussion/activity. Pt identified "all types" as their favorite genre of music that calms themselves down. Pt also shared a song for the group to listen too; an original that he wrote himself and sang aloud to group. Engaged with peers appropriately.  Modes of Intervention: Activity, Discussion, and Education  Patient Response to Interventions:  Attentive, Engaged, and Interested    Plan: Continue to engage patient in OT groups 2 - 3x/week.  02/26/2021  Donne Hazel, OT/L

## 2021-02-26 NOTE — B[facility] Counselor (Signed)
[facility] will be contacting this patient when they are discharged and will coordinate with pt's CST team to meet with patient.      Ruthann Cancer MSW, LCSW Clincal Social Worker  [facility]

## 2021-02-27 LAB — COMPREHENSIVE METABOLIC PANEL
ALT: 54 U/L — ABNORMAL HIGH (ref 0–44)
AST: 29 U/L (ref 15–41)
Albumin: 4.3 g/dL (ref 3.5–5.0)
Alkaline Phosphatase: 160 U/L — ABNORMAL HIGH (ref 38–126)
Anion gap: 10 (ref 5–15)
BUN: 23 mg/dL — ABNORMAL HIGH (ref 6–20)
CO2: 26 mmol/L (ref 22–32)
Calcium: 9.3 mg/dL (ref 8.9–10.3)
Chloride: 99 mmol/L (ref 98–111)
Creatinine, Ser: 1.18 mg/dL (ref 0.61–1.24)
GFR, Estimated: >60 mL/min (ref >60)
Glucose, Bld: 140 mg/dL — ABNORMAL HIGH (ref 70–99)
Potassium: 4.1 mmol/L (ref 3.5–5.1)
Sodium: 135 mmol/L (ref 135–145)
Total Bilirubin: 0.4 mg/dL (ref 0.3–1.2)
Total Protein: 8.3 g/dL — ABNORMAL HIGH (ref 6.5–8.1)

## 2021-02-27 LAB — GLUCOSE, CAPILLARY
Glucose-Capillary: 124 mg/dL — ABNORMAL HIGH (ref 70–99)
Glucose-Capillary: 182 mg/dL — ABNORMAL HIGH (ref 70–99)
Glucose-Capillary: 372 mg/dL — ABNORMAL HIGH (ref 70–99)
Glucose-Capillary: 247 mg/dL — ABNORMAL HIGH (ref 70–99)

## 2021-02-27 MED ORDER — INSULIN ASPART PROT & ASPART (70-30 MIX) 100 UNIT/ML ~~LOC~~ SUSP
48.0000 [IU] | Freq: Every day | SUBCUTANEOUS | Status: DC
  Administered 2021-02-27 – 2021-03-02 (×4): 48 [IU] via SUBCUTANEOUS

## 2021-02-27 MED ORDER — INSULIN ASPART PROT & ASPART (70-30 MIX) 100 UNIT/ML ~~LOC~~ SUSP
42.0000 [IU] | Freq: Every day | SUBCUTANEOUS | Status: DC
  Administered 2021-02-27 – 2021-03-01 (×3): 42 [IU] via SUBCUTANEOUS

## 2021-02-27 NOTE — Progress Notes (Signed)
[facility] MD Progress Note  02/27/2021 12:45 PM Mitchell [name]  MRN:  [id number] Subjective:   Mitchell [name] is a 60 year old male with a PPHx of Schizophrenia and multiple hospitalizations presents under IVC for acute psychosis.   Psychiatric Team made the following recommendations yesterday: -Continue Haldol 10 mg BID for psychosis  -Continue Clozapine 50 mg am/50 mg pm.    Case was discussed in the multidisciplinary team. MAR was reviewed and patient was compliant with medications. CSW discussed his outpatient follow up has been set up and has PACE program set up for home interview on Monday. Patient has outpatient PCP follow up as well.  Patient was seen in the room and assessed on the unit.  On interview patient reports that he is "still doing good".  He reports that he is sleeping good.  He reports his appetite is doing good.  He reports no suicidal ideation, homicidal ideation, or auditory visual hallucinations.   Patient denies any significant side effects from clozapine.  Patient was alert was alert and oriented to time, and place.  Patient insisted he is the president of the [place].  Discussed with patient that he will be tried on administering insulin himself prior to discharge. Patient verbalized understanding and states he has been successfully self administering insulin. Patient continues to pleasant and cooperative throughout assessment.  ROS for clozapine was reassessed today: Malaise: denies Chest pain: denies Shortness of breath: denies Exertional capacity: denies Tachycardia: denies Cough: denies Fever: denies Sedation: denies Orthostatic hypotension (dizziness with standing): denies Hypersalivation: denies Constipation: denies Symptoms of GERD: denies Nausea: denies Nocturnal enuresis: denies   Principal Problem: Schizophrenia ([place]) Diagnosis: Principal Problem:   Schizophrenia ([place]) Active Problems:   Type 2 diabetes mellitus ([place])   Past Psychiatric  History: Schizophrenia and multiple hospitalizations  Past Medical History:  Past Medical History:  Diagnosis Date   Bipolar affective disorder ([place])    takes Zyprexa daily   Diabetes mellitus    takes Victoza,Metformin,and Glipizide daily   Hypertension    takes Amlodipine,Lisinopril and Clonidine daily   Hyponatremia    history of   Mental disorder    takes Lithium daily   Schizoaffective disorder    takes Trazodone nightly   Seasonal allergies    takes Claritin daily   Sleep apnea    sleep study >68yrs ago   Stroke [facility])    left arm weakness    Past Surgical History:  Procedure Laterality Date   CATARACT EXTRACTION W/PHACO Right 02/14/2013   Procedure: CATARACT EXTRACTION PHACO AND INTRAOCULAR LENS PLACEMENT ([place]);  Surgeon: Adonis Brook, MD;  Location: [place];  Service: Ophthalmology;  Laterality: Right;   CATARACT EXTRACTION W/PHACO Left 06/13/2013   Procedure: CATARACT EXTRACTION PHACO AND INTRAOCULAR LENS PLACEMENT ([facility]);  Surgeon: Adonis Brook, MD;  Location: [place];  Service: Ophthalmology;  Laterality: Left;   CIRCUMCISION  20 yrs. ago   EYE SURGERY     Family History: History reviewed. No pertinent family history. Family Psychiatric  History: BAPD Social History:  Social History   Substance and Sexual Activity  Alcohol Use Not Currently     Social History   Substance and Sexual Activity  Drug Use No    Social History   Socioeconomic History   Marital status: Divorced    Spouse name: Not on file   Number of children: Not on file   Years of education: Not on file   Highest education level: Not on file  Occupational History   Not  on file  Tobacco Use   Smoking status: Never   Smokeless tobacco: Never  Vaping Use   Vaping Use: Never used  Substance and Sexual Activity   Alcohol use: Not Currently   Drug use: No   Sexual activity: Yes    Birth control/protection: None  Other Topics Concern   Not on file  Social History Narrative   Not on file    Social Determinants of Health   Financial Resource Strain: Not on file  Food Insecurity: Not on file  Transportation Needs: Not on file  Physical Activity: Not on file  Stress: Not on file  Social Connections: Not on file   Additional Social History:                         Sleep: Good  Appetite:  Good  Current Medications: Current Facility-Administered Medications  Medication Dose Route Frequency Provider Last Rate Last Admin   acetaminophen (TYLENOL) tablet 650 mg  650 mg Oral Q6H PRN Novella Olive, NP       alum & mag hydroxide-simeth (MAALOX/MYLANTA) 200-200-20 MG/5ML suspension 30 mL  30 mL Oral Q4H PRN Novella Olive, NP       amLODipine (NORVASC) tablet 10 mg  10 mg Oral Daily Novella Olive, NP   10 mg at 02/27/21 0801   aspirin EC tablet 81 mg  81 mg Oral Daily Novella Olive, NP   81 mg at 02/27/21 0801   atenolol (TENORMIN) tablet 12.5 mg  12.5 mg Oral Daily Park Pope, MD   12.5 mg at 02/27/21 0800   benztropine (COGENTIN) tablet 1 mg  1 mg Oral BID Novella Olive, NP   1 mg at 02/27/21 0801   cloNIDine (CATAPRES) tablet 0.2 mg  0.2 mg Oral BID Massengill, Harrold Donath, MD   0.2 mg at 02/27/21 0802   cloZAPine (CLOZARIL) tablet 50 mg  50 mg Oral BID Park Pope, MD   50 mg at 02/27/21 0800   glipiZIDE (GLUCOTROL XL) 24 hr tablet 20 mg  20 mg Oral Q breakfast Park Pope, MD   20 mg at 02/27/21 0800   haloperidol (HALDOL) tablet 10 mg  10 mg Oral BID Park Pope, MD   10 mg at 02/27/21 0801   insulin aspart (novoLOG) injection 0-15 Units  0-15 Units Subcutaneous TID WC Hill, Shelbie Hutching, MD   3 Units at 02/27/21 1210   insulin aspart (novoLOG) injection 0-5 Units  0-5 Units Subcutaneous QHS Novella Olive, NP   3 Units at 02/26/21 2016   insulin aspart protamine- aspart (NOVOLOG MIX 70/30) injection 48 Units  48 Units Subcutaneous Q breakfast Park Pope, MD   48 Units at 02/27/21 0800   And   insulin aspart protamine- aspart (NOVOLOG MIX 70/30) injection 42 Units   42 Units Subcutaneous Q supper Park Pope, MD       lisinopril (ZESTRIL) tablet 40 mg  40 mg Oral Daily Novella Olive, NP   40 mg at 02/27/21 0801   lithium carbonate (ESKALITH) CR tablet 450 mg  450 mg Oral Q12H Novella Olive, NP   450 mg at 02/27/21 0800   magnesium hydroxide (MILK OF MAGNESIA) suspension 30 mL  30 mL Oral Daily PRN Novella Olive, NP       OLANZapine zydis (ZYPREXA) disintegrating tablet 10 mg  10 mg Oral Q8H PRN Novella Olive, NP   10 mg at 02/03/21 2342  And   ziprasidone (GEODON) injection 20 mg  20 mg Intramuscular PRN Chalmers Guest, NP       polyethylene glycol (MIRALAX / GLYCOLAX) packet 17 g  17 g Oral Daily PRN Ethelene Hal, NP       senna-docusate (Senokot-S) tablet 1 tablet  1 tablet Oral Daily Corky Sox, MD   1 tablet at 02/26/21 0749   traZODone (DESYREL) tablet 50 mg  50 mg Oral Aliene Altes, MD   50 mg at 02/26/21 2015    Lab Results:  Results for orders placed or performed during the hospital encounter of 01/28/21 (from the past 48 hour(s))  Glucose, capillary     Status: Abnormal   Collection Time: 02/25/21  4:56 PM  Result Value Ref Range   Glucose-Capillary 324 (H) 70 - 99 mg/dL    Comment: Glucose reference range applies only to samples taken after fasting for at least 8 hours.  Glucose, capillary     Status: Abnormal   Collection Time: 02/25/21  8:07 PM  Result Value Ref Range   Glucose-Capillary 325 (H) 70 - 99 mg/dL    Comment: Glucose reference range applies only to samples taken after fasting for at least 8 hours.  Glucose, capillary     Status: Abnormal   Collection Time: 02/26/21  6:04 AM  Result Value Ref Range   Glucose-Capillary 161 (H) 70 - 99 mg/dL    Comment: Glucose reference range applies only to samples taken after fasting for at least 8 hours.  CBC     Status: Abnormal   Collection Time: 02/26/21  6:41 AM  Result Value Ref Range   WBC 7.1 4.0 - 10.5 K/uL   RBC 4.17 (L) 4.22 - 5.81 MIL/uL   Hemoglobin 12.9  (L) 13.0 - 17.0 g/dL   HCT 38.7 (L) 39.0 - 52.0 %   MCV 92.8 80.0 - 100.0 fL   MCH 30.9 26.0 - 34.0 pg   MCHC 33.3 30.0 - 36.0 g/dL   RDW 12.3 11.5 - 15.5 %   Platelets 262 150 - 400 K/uL   nRBC 0.0 0.0 - 0.2 %    Comment: Performed at [facility], [place] [address], [place], [place] [address]  Comprehensive metabolic panel     Status: Abnormal   Collection Time: 02/26/21  6:41 AM  Result Value Ref Range   Sodium 132 (L) 135 - 145 mmol/L   Potassium 4.3 3.5 - 5.1 mmol/L   Chloride 100 98 - 111 mmol/L   CO2 25 22 - 32 mmol/L   Glucose, Bld 201 (H) 70 - 99 mg/dL    Comment: Glucose reference range applies only to samples taken after fasting for at least 8 hours.   BUN 22 (H) 6 - 20 mg/dL   Creatinine, Ser 1.13 0.61 - 1.24 mg/dL   Calcium 9.0 8.9 - 10.3 mg/dL   Total Protein 7.7 6.5 - 8.1 g/dL   Albumin 3.8 3.5 - 5.0 g/dL   AST 35 15 - 41 U/L   ALT 53 (H) 0 - 44 U/L   Alkaline Phosphatase 145 (H) 38 - 126 U/L   Total Bilirubin 0.5 0.3 - 1.2 mg/dL   GFR, Estimated >60 >60 mL/min    Comment: (NOTE) Calculated using the CKD-EPI Creatinine Equation (2021)    Anion gap 7 5 - 15    Comment: Performed at [facility], [place] [address] [place], [place] [address]  Glucose, capillary  Status: Abnormal   Collection Time: 02/26/21 11:51 AM  Result Value Ref Range   Glucose-Capillary 229 (H) 70 - 99 mg/dL    Comment: Glucose reference range applies only to samples taken after fasting for at least 8 hours.  Glucose, capillary     Status: Abnormal   Collection Time: 02/26/21  5:12 PM  Result Value Ref Range   Glucose-Capillary 301 (H) 70 - 99 mg/dL    Comment: Glucose reference range applies only to samples taken after fasting for at least 8 hours.  Glucose, capillary     Status: Abnormal   Collection Time: 02/26/21  8:06 PM  Result Value Ref Range   Glucose-Capillary 298 (H) 70 - 99 mg/dL    Comment: Glucose reference range applies only to samples  taken after fasting for at least 8 hours.  Glucose, capillary     Status: Abnormal   Collection Time: 02/27/21  6:10 AM  Result Value Ref Range   Glucose-Capillary 124 (H) 70 - 99 mg/dL    Comment: Glucose reference range applies only to samples taken after fasting for at least 8 hours.  Comprehensive metabolic panel     Status: Abnormal   Collection Time: 02/27/21  6:24 AM  Result Value Ref Range   Sodium 135 135 - 145 mmol/L   Potassium 4.1 3.5 - 5.1 mmol/L   Chloride 99 98 - 111 mmol/L   CO2 26 22 - 32 mmol/L   Glucose, Bld 140 (H) 70 - 99 mg/dL    Comment: Glucose reference range applies only to samples taken after fasting for at least 8 hours.   BUN 23 (H) 6 - 20 mg/dL   Creatinine, Ser 1.18 0.61 - 1.24 mg/dL   Calcium 9.3 8.9 - 10.3 mg/dL   Total Protein 8.3 (H) 6.5 - 8.1 g/dL   Albumin 4.3 3.5 - 5.0 g/dL   AST 29 15 - 41 U/L   ALT 54 (H) 0 - 44 U/L   Alkaline Phosphatase 160 (H) 38 - 126 U/L   Total Bilirubin 0.4 0.3 - 1.2 mg/dL   GFR, Estimated >60 >60 mL/min    Comment: (NOTE) Calculated using the CKD-EPI Creatinine Equation (2021)    Anion gap 10 5 - 15    Comment: Performed at [facility], [place] [address] [place], [place] [address]  Glucose, capillary     Status: Abnormal   Collection Time: 02/27/21 12:00 PM  Result Value Ref Range   Glucose-Capillary 182 (H) 70 - 99 mg/dL    Comment: Glucose reference range applies only to samples taken after fasting for at least 8 hours.    Blood Alcohol level:  Lab Results  Component Value Date   ETH <10 01/26/2021   ETH <10 [id number]    Metabolic Disorder Labs: Lab Results  Component Value Date   HGBA1C 11.1 (H) 01/26/2021   MPG 271.87 01/26/2021   MPG 202.99 01/07/2019   No results found for: PROLACTIN Lab Results  Component Value Date   CHOL 140 01/30/2021   TRIG 95 01/30/2021   HDL 49 01/30/2021   CHOLHDL 2.9 01/30/2021   VLDL 19 01/30/2021   LDLCALC 72 01/30/2021   LDLCALC 67  05/20/2020    Physical Findings:   Musculoskeletal: Strength & Muscle Tone: within normal limits Gait & Station: normal Patient leans: N/A  Psychiatric Specialty Exam:  Presentation  General Appearance: Casual Eye Contact:Fair  Speech:Clear and Coherent; Normal Rate  Speech Volume:Normal  Handedness:Right   Mood and  Affect  Mood:Euthymic  Affect:Congruent   Thought Process  Thought Processes:Coherent  Descriptions of Associations:Intact  Orientation:Full (Time, Place and Person)  Thought Content:continues to have grandiose delusions and possible paranoid delusions regarding name  History of Schizophrenia/Schizoaffective disorder:Yes  Duration of Psychotic Symptoms:Greater than six months  Hallucinations: none Ideas of Reference:None  Suicidal Thoughts: none Homicidal Thoughts:none  Sensorium  Memory:Immediate Fair; Recent Fair  Judgment:Fair  Insight:Fair   Executive Functions  Concentration:Fair  Attention Span:Fair  [place]  Language:Good   Psychomotor Activity  Psychomotor Activity: normal  Assets  Assets:Communication Skills; Resilience   Sleep  Sleep:No data recorded   Physical Exam: Physical Exam Vitals and nursing note reviewed.  Constitutional:      General: He is not in acute distress.    Appearance: Normal appearance. He is obese. He is not ill-appearing or toxic-appearing.  HENT:     Head: Normocephalic and atraumatic.  Pulmonary:     Effort: Pulmonary effort is normal.  Musculoskeletal:        General: Normal range of motion.  Neurological:     General: No focal deficit present.     Mental Status: He is alert.   Review of Systems  Respiratory:  Negative for cough and shortness of breath.   Cardiovascular:  Negative for chest pain.  Gastrointestinal:  Negative for abdominal pain, constipation, diarrhea, nausea and vomiting.  Neurological:  Negative for weakness and headaches.   Psychiatric/Behavioral:  Negative for depression, hallucinations and suicidal ideas. The patient is not nervous/anxious.    Blood pressure (!) 133/91, pulse 96, temperature (!) 97.5 F (36.4 C), temperature source Oral, resp. rate 16, height 5' 3.5" (1.613 m), weight 91.6 kg, SpO2 100 %. Body mass index is 35.22 kg/m.   Treatment Plan Summary: Daily contact with patient to assess and evaluate symptoms and progress in treatment and Medication management  Siddhant Shkolnik is a 60 year old male with a PPHx of Schizophrenia and multiple hospitalizations presents under IVC for acute psychosis.   Patient appears less sedated today. Referrals to PACE is pending per CSW to ensure he has support to safely return to his apartment.  Patient will have a home appointment early next week.  Otherwise, we will need to consider recommending group home given patient's diabetes and CBG ranging from 150s to 400s. We will continue to adjust insulin dosage to account for blood glucose. No psychiatric medication changes at this time.   Schizophrenia: -Continue Haldol 10 mg BID -Continue Clozaril 50 mg BID -Continue Lithium 450 mg BID (Lithium level 0.70 02/23/2021) -Continue Senokot-S daily -Continue Cogentin 1 mg BID for EPS prophylaxis -Continue Atenolol 12.5 mg daily -Clozaril labs 11/28 completed (next set due 12/5) -CBC on day of discharge  HTN: -Continue Lisinopril 40 mg daily -Continue Amlodipine 10 mg daily -Continue Clonidine 0.2 mg BID   Type 2 Diabetes: -Per Diabetic Coordinator, plan to adjust insulin coverage to account for discharge. Appreciate recs  -Discontinue Semglee, and Novolog meal coverage -INCREASE 70/30 Insulin 48 units with breakfast and 42 units with  supper  -Continue Glipizide 20 mg daily starting tomorrow 11/30 -Continue Novlog 0-15 units tid for meals and nighttime coverage   ABD distention, transaminitis: According staff this has increased since admission. KUB shows  non-obstructive gas pattern with no obvious stool burden. ABD exam benign except for protuberant abdomen. ALT persistently elevated to 62. Concern was for ascites despite negative fluid wave. RUQ [place] negative for hepatic parenchymal disease or biliary disease. For ascites causes, ruled  out parenchymal disease of the liver, CHF (BNP WNL), and patient likely does not have cancer. Ammonia WNL. Lipase slightly elevated.  -Hepatitis panel negative  -Continue PRN's: Tylenol, Maalox, Atarax, Milk of Magnesia, Trazodone   Park Pope, MD 02/27/2021, 12:45 PM

## 2021-02-27 NOTE — BHH Group Notes (Signed)
Patient did not attend group, 

## 2021-02-27 NOTE — Group Note (Signed)
Recreation Therapy Group Note   Group Topic:Healthy Decision Making  Group Date: 02/27/2021 Start Time: 1002 End Time: 1050 Facilitators: Caroll Rancher, LRT,CTRS Location: [address] Dayroom  Goal Area(s) Addresses:  Patient will effectively work with peer towards shared goal.  Patient will identify factors that guided their decision making.  Patient will pro-socially communicate ideas during group session.   Group Description: Patients were given a scenario that they were going to be stranded on a deserted [place] for several months before being rescued. Writer tasked them with making a list of 15 things they would choose to bring with them for "survival". The list of items was prioritized most important to least. Each patient would come up with their own list, then work together to create a new list of 15 items while in a group of 3-5 peers. LRT discussed each person's list and how it differed from others. The debrief included discussion of priorities, good decisions versus bad decisions, and how it is important to think before acting so we can make the best decision possible. LRT tied the concept of effective communication among group members to patient's support systems outside of the hospital and its benefit post discharge.   Affect/Mood: N/A   Participation Level: Did not attend    Clinical Observations/Individualized Feedback:     Plan: Continue to engage patient in RT group sessions 2-3x/week.   Caroll Rancher, LRT,CTRS 02/27/2021 1:45 PM

## 2021-02-27 NOTE — Progress Notes (Signed)
Inpatient Diabetes Program Recommendations  AACE/ADA: New Consensus Statement on Inpatient Glycemic Control (2015)  Target Ranges:  Prepandial:   less than 140 mg/dL      Peak postprandial:   less than 180 mg/dL (1-2 hours)      Critically ill patients:  140 - 180 mg/dL    Review of Glycemic Control  Latest Reference Range & Units 02/25/21 05:10 02/25/21 06:05 02/25/21 11:57 02/25/21 16:56 02/25/21 20:07 02/26/21 06:04  Glucose-Capillary 70 - 99 mg/dL 364 (H) 680 (H) 321 (H) 324 (H) 325 (H) 161 (H)    Latest Reference Range & Units 02/26/21 06:04 02/26/21 11:51 02/26/21 17:12 02/26/21 20:06 02/27/21 06:10  Glucose-Capillary 70 - 99 mg/dL 224 (H) 825 (H) 003 (H) 298 (H) 124 (H)   Current Orders: 70/30 42 units bid Novolog 0-15 units TID ac/hs  Glipizide 20 mg daily   - Increase 70/30 am dose to 48 units  Pm dose to remain at 42 units  Thanks, Christena Deem RN, MSN, BC-ADM Inpatient Diabetes Coordinator Team Pager [phone] (8a-5p)

## 2021-02-27 NOTE — Group Note (Signed)
LCSW Group Therapy Note   1:15pm   Type of Therapy and Topic: Group Therapy: Holding on to Grudges    Participation Level: None     Description of Group:  In this group patients will be asked to explore and define a grudge. Patients will be guided to discuss their thoughts, feelings, and reasons as to why people have grudges. Patients will process the impact grudges have on daily life and identify thoughts and feelings related to holding grudges. Facilitator will challenge patients to identify ways to let go of grudges and the benefits this provides. Patients will be confronted to address why one struggles letting go of grudges. Lastly, patients will identify feelings and thoughts related to what life would look like without grudges. This group will be process-oriented, with patients participating in exploration of their own experiences, giving and receiving support, and processing challenge from other group members.   Therapeutic Goals:  1. Patient will identify specific grudges related to their personal life.  2. Patient will identify feelings, thoughts, and beliefs around grudges.  3. Patient will identify how one releases grudges appropriately.  4. Patient will identify situations where they could have let go of the grudge, but instead chose to hold on.    Summary of Patient Progress: Pt attended group, however did not participate      Therapeutic Modalities:  Cognitive Behavioral Therapy  Solution Focused Therapy  Motivational Interviewing  Brief Therapy     Ruthann Cancer MSW, LCSW Clincal Social Worker  [facility]

## 2021-02-27 NOTE — Progress Notes (Signed)
   02/27/21 2100  Psych Admission Type (Psych Patients Only)  Admission Status Involuntary  Psychosocial Assessment  Patient Complaints None  Eye Contact Fair  Facial Expression Animated  Affect Appropriate to circumstance  Speech Logical/coherent  Interaction Assertive  Motor Activity Slow  Appearance/Hygiene Improved  Behavior Characteristics Cooperative  Mood Pleasant  Thought Process  Coherency WDL  Content WDL  Delusions WDL  Perception WDL  Hallucination None reported or observed  Judgment WDL  Confusion None  Danger to Self  Current suicidal ideation? Denies  Danger to Others  Danger to Others None reported or observed

## 2021-02-27 NOTE — BHH Group Notes (Signed)
Adult Psychoeducational Group Note  Date:  02/27/2021 Time:  9:21 PM  Group Topic/Focus:  Conflict Resolution:   The focus of this group is to discuss the conflict resolution process and how it may be used upon discharge.  Participation Level:  Active  Participation Quality:  Attentive  Affect:  Appropriate  Cognitive:  Appropriate  Insight: Good  Engagement in Group:  Engaged  Modes of Intervention:  Discussion  Additional Comments  Jacalyn Lefevre 02/27/2021, 9:21 PM

## 2021-02-27 NOTE — Progress Notes (Signed)
   02/27/21 0000  Psych Admission Type (Psych Patients Only)  Admission Status Involuntary  Psychosocial Assessment  Patient Complaints None  Eye Contact Fair  Facial Expression Animated  Affect Appropriate to circumstance  Speech Logical/coherent  Interaction Assertive  Motor Activity Slow  Appearance/Hygiene Disheveled;Poor hygiene  Behavior Characteristics Cooperative  Mood Pleasant  Thought Process  Coherency WDL  Content WDL  Delusions WDL  Perception WDL  Hallucination None reported or observed  Judgment WDL  Confusion None  Danger to Self  Current suicidal ideation? Denies  Danger to Others  Danger to Others None reported or observed

## 2021-02-27 NOTE — Plan of Care (Signed)
  Problem: Activity: Goal: Will verbalize the importance of balancing activity with adequate rest periods Outcome: Progressing   Problem: Education: Goal: Will be free of psychotic symptoms Outcome: Progressing Goal: Knowledge of the prescribed therapeutic regimen will improve Outcome: Progressing   

## 2021-02-27 NOTE — Progress Notes (Signed)
Progress note    02/27/21 0800  Psych Admission Type (Psych Patients Only)  Admission Status Involuntary  Psychosocial Assessment  Patient Complaints None  Eye Contact Fair  Facial Expression Animated  Affect Appropriate to circumstance  Speech Logical/coherent  Interaction Assertive  Motor Activity Slow  Appearance/Hygiene Improved  Behavior Characteristics Cooperative;Appropriate to situation  Mood Pleasant  Thought Process  Coherency WDL  Content WDL  Delusions WDL  Perception WDL  Hallucination None reported or observed  Judgment WDL  Confusion None  Danger to Self  Current suicidal ideation? Denies  Danger to Others  Danger to Others None reported or observed

## 2021-02-28 LAB — GLUCOSE, CAPILLARY
Glucose-Capillary: 186 mg/dL — ABNORMAL HIGH (ref 70–99)
Glucose-Capillary: 217 mg/dL — ABNORMAL HIGH (ref 70–99)
Glucose-Capillary: 239 mg/dL — ABNORMAL HIGH (ref 70–99)
Glucose-Capillary: 278 mg/dL — ABNORMAL HIGH (ref 70–99)

## 2021-02-28 NOTE — Group Note (Signed)
LCSW Group Therapy   Due to high patient acuity and low staffing, group was unable to be held on 02/28/2021. The CSW supervisor as well as the on-duty [facility] was made aware.  Aldine Contes LCSWA  12:37 PM

## 2021-02-28 NOTE — Progress Notes (Signed)
Adult Psychoeducational Group Note  Date:  02/28/2021 Time:  8:18 PM  Group Topic/Focus:  Wrap-Up Group:   The focus of this group is to help patients review their daily goal of treatment and discuss progress on daily workbooks.  Participation Level:  Active  Participation Quality:  Appropriate  Affect:  Appropriate  Cognitive:  Appropriate  Insight: Appropriate  Engagement in Group:  Engaged  Modes of Intervention:  Discussion  Additional Comments:  Pt stated his day was great.  Pt did not set goal for today.  Wynema Birch D 02/28/2021, 8:18 PM

## 2021-02-28 NOTE — Progress Notes (Signed)
Patient denies SI HI and AVH this shift. Patient has bene pleasant and cooperative on the unit this shift. Patient attended groups and engaged appropriately in activities.   Assess patient for safety, offer medications as prescribed, engage patient in 1:1 staff talks.   Patient able to contract for safety. Continue to monitor as planned.

## 2021-02-28 NOTE — Progress Notes (Signed)
Patient has been calm and cooperative tonight. Compliant with his medications   02/28/21 2055  Psych Admission Type (Psych Patients Only)  Admission Status Involuntary  Psychosocial Assessment  Patient Complaints None  Eye Contact Fair  Facial Expression Animated  Affect Appropriate to circumstance  Speech Logical/coherent  Interaction Assertive  Motor Activity Slow  Appearance/Hygiene Improved  Behavior Characteristics Cooperative;Appropriate to situation  Mood Pleasant  Aggressive Behavior  Effect No apparent injury  Thought Process  Coherency WDL  Content WDL  Delusions WDL  Perception WDL  Hallucination None reported or observed  Judgment WDL  Confusion None  Danger to Self  Current suicidal ideation? Denies  Danger to Others  Danger to Others None reported or observed

## 2021-02-28 NOTE — [facility] Group Notes (Signed)
Psychoeducational Group Note    Date:02/28/2021 Time: 1300-1400    Purpose of Group: . The group focus' on teaching patients on how to identify their needs and their Life Skills:  A group where two lists are made. What people need and what are things that we do that are unhealthy. The lists are developed by the patients and it is explained that we often do the actions that are not healthy to get our list of needs met.  Goal:: to develop the coping skills needed to get their needs met  Participation Level:  Pt came in the room, but sat down and immediately fell asleep. Decided that he would go to his room and lie down Mitchell [name] A

## 2021-03-01 LAB — GLUCOSE, CAPILLARY
Glucose-Capillary: 217 mg/dL — ABNORMAL HIGH (ref 70–99)
Glucose-Capillary: 161 mg/dL — ABNORMAL HIGH (ref 70–99)
Glucose-Capillary: 282 mg/dL — ABNORMAL HIGH (ref 70–99)
Glucose-Capillary: 306 mg/dL — ABNORMAL HIGH (ref 70–99)

## 2021-03-01 NOTE — Progress Notes (Addendum)
[facility] MD Progress Note  03/01/2021 7:27 AM Mitchell [name]  MRN:  [id number] Subjective:   Mitchell [name] is a 60 year old male with a PPHx of Schizophrenia and multiple hospitalizations presents under IVC for acute psychosis.   Psychiatric Team made the following recommendations yesterday: -Continue Haldol 10 mg BID for psychosis  -Continue Clozapine 50 mg am/50 mg pm.    Case was discussed in the multidisciplinary team. MAR was reviewed and patient was compliant with medications.   Patient was seen in the room and assessed on the unit.  On interview patient reports that he is "doing great".  He reports that he is sleeping good.  He reports his appetite is doing good.  He reports no suicidal ideation, homicidal ideation, or auditory visual hallucinations.  Patient reports that "everything is great.  Patient denies any significant side effects from clozapine.  Patient was alert was alert and oriented to time, and place.  Patient insisted he is the president of the [place].  Patient continues to pleasant and cooperative throughout assessment.  Patient is looking forward to discharge tomorrow.  Patient continues to deny side effects from medications.  Discussed plan with patient to discharge tomorrow after he receives his clozapine labs.  Patient verbalized agreement and understanding.  Patient had no other questions at this time.  ROS for clozapine was reassessed today: Malaise: denies Chest pain: denies Shortness of breath: denies Exertional capacity: denies Tachycardia: denies Cough: denies Fever: denies Sedation: denies Orthostatic hypotension (dizziness with standing): denies Hypersalivation: denies Constipation: denies Symptoms of GERD: denies Nausea: denies Nocturnal enuresis: denies   Principal Problem: Schizophrenia ([place]) Diagnosis: Principal Problem:   Schizophrenia ([place]) Active Problems:   Type 2 diabetes mellitus ([place])   Past Psychiatric History: Schizophrenia and  multiple hospitalizations  Past Medical History:  Past Medical History:  Diagnosis Date   Bipolar affective disorder ([place])    takes Zyprexa daily   Diabetes mellitus    takes Victoza,Metformin,and Glipizide daily   Hypertension    takes Amlodipine,Lisinopril and Clonidine daily   Hyponatremia    history of   Mental disorder    takes Lithium daily   Schizoaffective disorder    takes Trazodone nightly   Seasonal allergies    takes Claritin daily   Sleep apnea    sleep study >68yrs ago   Stroke [facility])    left arm weakness    Past Surgical History:  Procedure Laterality Date   CATARACT EXTRACTION W/PHACO Right 02/14/2013   Procedure: CATARACT EXTRACTION PHACO AND INTRAOCULAR LENS PLACEMENT ([place]);  Surgeon: Adonis Brook, MD;  Location: [facility];  Service: Ophthalmology;  Laterality: Right;   CATARACT EXTRACTION W/PHACO Left 06/13/2013   Procedure: CATARACT EXTRACTION PHACO AND INTRAOCULAR LENS PLACEMENT ([facility]);  Surgeon: Adonis Brook, MD;  Location: [facility];  Service: Ophthalmology;  Laterality: Left;   CIRCUMCISION  20 yrs. ago   EYE SURGERY     Family History: History reviewed. No pertinent family history. Family Psychiatric  History: BAPD Social History:  Social History   Substance and Sexual Activity  Alcohol Use Not Currently     Social History   Substance and Sexual Activity  Drug Use No    Social History   Socioeconomic History   Marital status: Divorced    Spouse name: Not on file   Number of children: Not on file   Years of education: Not on file   Highest education level: Not on file  Occupational History   Not on file  Tobacco  Use   Smoking status: Never   Smokeless tobacco: Never  Vaping Use   Vaping Use: Never used  Substance and Sexual Activity   Alcohol use: Not Currently   Drug use: No   Sexual activity: Yes    Birth control/protection: None  Other Topics Concern   Not on file  Social History Narrative   Not on file   Social Determinants of  Health   Financial Resource Strain: Not on file  Food Insecurity: Not on file  Transportation Needs: Not on file  Physical Activity: Not on file  Stress: Not on file  Social Connections: Not on file   Additional Social History:        Sleep: Good  Appetite:  Good  Current Medications: Current Facility-Administered Medications  Medication Dose Route Frequency Provider Last Rate Last Admin   acetaminophen (TYLENOL) tablet 650 mg  650 mg Oral Q6H PRN Chalmers Guest, NP       alum & mag hydroxide-simeth (MAALOX/MYLANTA) 200-200-20 MG/5ML suspension 30 mL  30 mL Oral Q4H PRN Chalmers Guest, NP       amLODipine (NORVASC) tablet 10 mg  10 mg Oral Daily Chalmers Guest, NP   10 mg at 02/28/21 [id number]   aspirin EC tablet 81 mg  81 mg Oral Daily Chalmers Guest, NP   81 mg at 02/28/21 1249   atenolol (TENORMIN) tablet 12.5 mg  12.5 mg Oral Daily France Ravens, MD   12.5 mg at 02/28/21 0803   benztropine (COGENTIN) tablet 1 mg  1 mg Oral BID Chalmers Guest, NP   1 mg at 02/28/21 1727   cloNIDine (CATAPRES) tablet 0.2 mg  0.2 mg Oral BID Massengill, Ovid Curd, MD   0.2 mg at 02/28/21 1727   cloZAPine (CLOZARIL) tablet 50 mg  50 mg Oral BID France Ravens, MD   50 mg at 02/28/21 1726   glipiZIDE (GLUCOTROL XL) 24 hr tablet 20 mg  20 mg Oral Q breakfast France Ravens, MD   20 mg at 02/28/21 0804   haloperidol (HALDOL) tablet 10 mg  10 mg Oral BID France Ravens, MD   10 mg at 02/28/21 1726   insulin aspart (novoLOG) injection 0-15 Units  0-15 Units Subcutaneous TID WC Eero Dini, Jackie Plum, MD   5 Units at 03/01/21 0647   insulin aspart (novoLOG) injection 0-5 Units  0-5 Units Subcutaneous QHS Chalmers Guest, NP   3 Units at 02/28/21 2051   insulin aspart protamine- aspart (NOVOLOG MIX 70/30) injection 48 Units  48 Units Subcutaneous Q breakfast France Ravens, MD   48 Units at 02/28/21 0809   And   insulin aspart protamine- aspart (NOVOLOG MIX 70/30) injection 42 Units  42 Units Subcutaneous Q supper France Ravens, MD   42  Units at 02/28/21 1730   lisinopril (ZESTRIL) tablet 40 mg  40 mg Oral Daily Chalmers Guest, NP   40 mg at 02/28/21 0803   lithium carbonate (ESKALITH) CR tablet 450 mg  450 mg Oral Q12H Chalmers Guest, NP   450 mg at 02/28/21 2052   magnesium hydroxide (MILK OF MAGNESIA) suspension 30 mL  30 mL Oral Daily PRN Chalmers Guest, NP       OLANZapine zydis (ZYPREXA) disintegrating tablet 10 mg  10 mg Oral Q8H PRN Chalmers Guest, NP   10 mg at 02/03/21 2342   And   ziprasidone (GEODON) injection 20 mg  20 mg Intramuscular PRN Chalmers Guest, NP  polyethylene glycol (MIRALAX / GLYCOLAX) packet 17 g  17 g Oral Daily PRN Laveda Abbe, NP       senna-docusate (Senokot-S) tablet 1 tablet  1 tablet Oral Daily Carlyn Reichert, MD   1 tablet at 02/28/21 0805   traZODone (DESYREL) tablet 50 mg  50 mg Oral Seabron Spates, MD   50 mg at 02/28/21 2052    Lab Results:  Results for orders placed or performed during the hospital encounter of 01/28/21 (from the past 48 hour(s))  Glucose, capillary     Status: Abnormal   Collection Time: 02/27/21 12:00 PM  Result Value Ref Range   Glucose-Capillary 182 (H) 70 - 99 mg/dL    Comment: Glucose reference range applies only to samples taken after fasting for at least 8 hours.  Glucose, capillary     Status: Abnormal   Collection Time: 02/27/21  4:53 PM  Result Value Ref Range   Glucose-Capillary 372 (H) 70 - 99 mg/dL    Comment: Glucose reference range applies only to samples taken after fasting for at least 8 hours.  Glucose, capillary     Status: Abnormal   Collection Time: 02/27/21  7:52 PM  Result Value Ref Range   Glucose-Capillary 247 (H) 70 - 99 mg/dL    Comment: Glucose reference range applies only to samples taken after fasting for at least 8 hours.  Glucose, capillary     Status: Abnormal   Collection Time: 02/28/21  5:46 AM  Result Value Ref Range   Glucose-Capillary 186 (H) 70 - 99 mg/dL    Comment: Glucose reference range applies only  to samples taken after fasting for at least 8 hours.  Glucose, capillary     Status: Abnormal   Collection Time: 02/28/21 11:57 AM  Result Value Ref Range   Glucose-Capillary 217 (H) 70 - 99 mg/dL    Comment: Glucose reference range applies only to samples taken after fasting for at least 8 hours.   Comment 1 Notify RN    Comment 2 Document in Chart   Glucose, capillary     Status: Abnormal   Collection Time: 02/28/21  5:12 PM  Result Value Ref Range   Glucose-Capillary 239 (H) 70 - 99 mg/dL    Comment: Glucose reference range applies only to samples taken after fasting for at least 8 hours.  Glucose, capillary     Status: Abnormal   Collection Time: 02/28/21  8:35 PM  Result Value Ref Range   Glucose-Capillary 278 (H) 70 - 99 mg/dL    Comment: Glucose reference range applies only to samples taken after fasting for at least 8 hours.  Glucose, capillary     Status: Abnormal   Collection Time: 03/01/21  6:24 AM  Result Value Ref Range   Glucose-Capillary 217 (H) 70 - 99 mg/dL    Comment: Glucose reference range applies only to samples taken after fasting for at least 8 hours.   Comment 1 Notify RN    Comment 2 Document in Chart     Blood Alcohol level:  Lab Results  Component Value Date   ETH <10 01/26/2021   ETH <10 01/21/2021    Metabolic Disorder Labs: Lab Results  Component Value Date   HGBA1C 11.1 (H) 01/26/2021   MPG 271.87 01/26/2021   MPG 202.99 01/07/2019   No results found for: PROLACTIN Lab Results  Component Value Date   CHOL 140 01/30/2021   TRIG 95 01/30/2021   HDL 49 01/30/2021   CHOLHDL  2.9 01/30/2021   VLDL 19 01/30/2021   LDLCALC 72 01/30/2021   LDLCALC 67 05/20/2020    Physical Findings:   Musculoskeletal: Strength & Muscle Tone: within normal limits Gait & Station: normal Patient leans: N/A  Psychiatric Specialty Exam:  Presentation  General Appearance: Casual Eye Contact:Fair  Speech:Clear and Coherent; Normal Rate  Speech  Volume:Normal  Handedness:Right   Mood and Affect  Mood:Euthymic  Affect:Congruent   Thought Process  Thought Processes:Coherent  Descriptions of Associations:Intact  Orientation:Full (Time, Place and Person)  Thought Content:continues to have grandiose delusions and possible paranoid delusions regarding name  History of Schizophrenia/Schizoaffective disorder:Yes  Duration of Psychotic Symptoms:Greater than six months  Hallucinations: none Ideas of Reference:None  Suicidal Thoughts: none Homicidal Thoughts:none  Sensorium  Memory:Immediate Fair; Recent Fair  Judgment:Fair  Insight:Fair   Executive Functions  Concentration:Fair  Attention Span:Fair  [place]  Language:Good   Psychomotor Activity  Psychomotor Activity: normal  Assets  Assets:Communication Skills; Resilience   Sleep  Sleep:No data recorded   Physical Exam: Physical Exam Vitals and nursing note reviewed.  Constitutional:      General: He is not in acute distress.    Appearance: Normal appearance. He is obese. He is not ill-appearing or toxic-appearing.  HENT:     Head: Normocephalic and atraumatic.  Pulmonary:     Effort: Pulmonary effort is normal.  Musculoskeletal:        General: Normal range of motion.  Neurological:     General: No focal deficit present.     Mental Status: He is alert.   Review of Systems  Respiratory:  Negative for cough and shortness of breath.   Cardiovascular:  Negative for chest pain.  Gastrointestinal:  Negative for abdominal pain, constipation, diarrhea, nausea and vomiting.  Neurological:  Negative for weakness and headaches.  Psychiatric/Behavioral:  Negative for depression, hallucinations and suicidal ideas. The patient is not nervous/anxious.    Blood pressure 118/80, pulse 99, temperature 98.4 F (36.9 C), temperature source Oral, resp. rate 16, height 5' 3.5" (1.613 m), weight 91.6 kg, SpO2 99 %. Body mass  index is 35.22 kg/m.   Treatment Plan Summary: Daily contact with patient to assess and evaluate symptoms and progress in treatment and Medication management  Mitchell [name] is a 60 year old male with a PPHx of Schizophrenia and multiple hospitalizations presents under IVC for acute psychosis.   Patient appears less sedated today. Referrals to [facility] is pending per CSW to ensure he has support to safely return to his apartment.  Patient will have a home appointment early next week.  Otherwise, we will need to consider recommending group home given patient's diabetes and CBG ranging from 150s to 400s. We will continue to adjust insulin dosage to account for blood glucose. No psychiatric medication changes at this time.   Schizophrenia: -Continue Haldol 10 mg BID -Continue Clozaril 50 mg BID -Continue Lithium 450 mg BID (Lithium level 0.70 02/23/2021) -Continue Senokot-S daily -Continue Cogentin 1 mg BID for EPS prophylaxis -Continue Atenolol 12.5 mg daily -Clozaril labs 11/28 completed (next set due 12/5) -CBC on day of discharge  HTN: -Continue Lisinopril 40 mg daily -Continue Amlodipine 10 mg daily -Continue Clonidine 0.2 mg BID   Type 2 Diabetes: -Per Diabetic Coordinator, plan to adjust insulin coverage to account for discharge. Appreciate recs  -Discontinue Semglee, and Novolog meal coverage -Continue 70/30 Insulin 48 units with breakfast and 42 units with  supper  -Continue Glipizide 20 mg daily starting tomorrow 11/30 -Continue Novlog  0-15 units tid for meals and nighttime coverage   ABD distention, transaminitis: According staff this has increased since admission. KUB shows non-obstructive gas pattern with no obvious stool burden. ABD exam benign except for protuberant abdomen. ALT persistently elevated to 62. Concern was for ascites despite negative fluid wave. RUQ [place] negative for hepatic parenchymal disease or biliary disease. For ascites causes, ruled out parenchymal  disease of the liver, CHF (BNP WNL), and patient likely does not have cancer. Ammonia WNL. Lipase slightly elevated.  -Hepatitis panel negative  -Continue PRN's: Tylenol, Maalox, Atarax, Milk of Magnesia, Trazodone   France Ravens, MD 03/01/2021, 7:27 AM

## 2021-03-01 NOTE — Progress Notes (Signed)
Pt was encouraged but didn't attend orientation/goals group. ?

## 2021-03-01 NOTE — Progress Notes (Signed)
Adult Psychoeducational Group Note  Date:  03/01/2021 Time:  10:04 PM  Group Topic/Focus:  Wrap-Up Group:   The focus of this group is to help patients review their daily goal of treatment and discuss progress on daily workbooks.  Participation Level:  Active  Participation Quality:  Appropriate  Affect:  Appropriate  Cognitive:  Appropriate  Insight: Appropriate  Engagement in Group:  Engaged  Modes of Intervention:  Discussion  Additional Comments:  Pt stated his goal for today was to focus on his treatment plan. Pt stated he accomplished his goal today. Pt stated he talked with his doctor and social worker about his care today. Pt rated his overall day a 10. Pt stated he made no calls today. Pt stated he felt better about himself today. Pt stated he was able to attend all meals. Pt stated he took all medications provided today.  Pt stated his appetite was pretty good today. Pt rated sleep last night was pretty good. Pt stated the goal tonight was to get some rest. Pt stated he had no physical pain tonight. Pt deny visual hallucinations and auditory issues tonight. Pt denies thoughts of harming himself or others. Pt stated he would alert staff if anything changed  [name] 03/01/2021, 10:04 PM

## 2021-03-01 NOTE — Progress Notes (Signed)
Mitchell [name] has been up and visible in milieu today, he was seen interacting appropriately with peers today, he has denied SI, has received all medications without incident and spoke about being discharged tomorrow. Safety maintained, will continue to monitor.

## 2021-03-01 NOTE — Group Note (Signed)
[facility] LCSW Group Therapy Note  Date/Time:  03/01/2021  11:00AM-12:00PM  Type of Therapy and Topic:  Group Therapy:  Music and Mood  Participation Level:  Active   Description of Group: In this process group, members listened to a variety of genres of music and identified that different types of music evoke different responses.  Patients were encouraged to identify music that was soothing for them and music that was energizing for them.  Patients discussed how this knowledge can help with wellness and recovery in various ways including managing depression and anxiety as well as encouraging healthy sleep habits.    Therapeutic Goals: Patients will explore the impact of different varieties of music on mood Patients will verbalize the thoughts they have when listening to different types of music Patients will identify music that is soothing to them as well as music that is energizing to them Patients will discuss how to use this knowledge to assist in maintaining wellness and recovery Patients will explore the use of music as a coping skill  Summary of Patient Progress:  At the beginning of group, patient expressed their mood was "good".  At the end of group, patient expressed feeling supported by the music played.    Therapeutic Modalities: Solution Focused Brief Therapy Activity   Steve Rattler, LCSWA 03/01/2021 1:12 PM

## 2021-03-01 NOTE — Progress Notes (Signed)
[facility] MD Progress Note Late entry for 02/28/21  03/01/2021 7:32 AM Mitchell [name]  MRN:  [id number] Subjective:   Mitchell [name] is a 60 year old male with a PPHx of Schizophrenia and multiple hospitalizations presents under IVC for acute psychosis.   Psychiatric Team made the following recommendations yesterday: -Continue Haldol 10 mg BID for psychosis  -Continue Clozapine 50 mg am/50 mg pm.    Case was discussed in the multidisciplinary team. MAR was reviewed and patient was compliant with medications. The patient has been using the insulin pens successfully without issues.   Patient was seen in the room and assessed on the unit.  On interview patient reports that he is "sitting here praying" which is his afternoon ritual. He feels comfortable with the discharge plans so far. He has been coping with diabetes for some time. He is aware that he will have to follow up with his primary care and that there will be some home visits as well. He currently doesn't have any questions. No new physical complaints today. Delusions remain stable.    ROS for clozapine was reassessed today: Malaise: denies Chest pain: denies Shortness of breath: denies Exertional capacity: denies Tachycardia: denies Cough: denies Fever: denies Sedation: denies Orthostatic hypotension (dizziness with standing): denies Hypersalivation: denies Constipation: denies Symptoms of GERD: denies Nausea: denies Nocturnal enuresis: denies   Principal Problem: Schizophrenia (HCC) Diagnosis: Principal Problem:   Schizophrenia (HCC) Active Problems:   Type 2 diabetes mellitus (HCC)   Past Psychiatric History: Schizophrenia and multiple hospitalizations  Past Medical History:  Past Medical History:  Diagnosis Date   Bipolar affective disorder (HCC)    takes Zyprexa daily   Diabetes mellitus    takes Victoza,Metformin,and Glipizide daily   Hypertension    takes Amlodipine,Lisinopril and Clonidine daily    Hyponatremia    history of   Mental disorder    takes Lithium daily   Schizoaffective disorder    takes Trazodone nightly   Seasonal allergies    takes Claritin daily   Sleep apnea    sleep study >32yrs ago   Stroke [facility])    left arm weakness    Past Surgical History:  Procedure Laterality Date   CATARACT EXTRACTION W/PHACO Right 02/14/2013   Procedure: CATARACT EXTRACTION PHACO AND INTRAOCULAR LENS PLACEMENT (IOC);  Surgeon: Shade Flood, MD;  Location: [facility] OR;  Service: Ophthalmology;  Laterality: Right;   CATARACT EXTRACTION W/PHACO Left 06/13/2013   Procedure: CATARACT EXTRACTION PHACO AND INTRAOCULAR LENS PLACEMENT (IOC);  Surgeon: Shade Flood, MD;  Location: [facility] OR;  Service: Ophthalmology;  Laterality: Left;   CIRCUMCISION  20 yrs. ago   EYE SURGERY     Family History: History reviewed. No pertinent family history. Family Psychiatric  History: BAPD Social History:  Social History   Substance and Sexual Activity  Alcohol Use Not Currently     Social History   Substance and Sexual Activity  Drug Use No    Social History   Socioeconomic History   Marital status: Divorced    Spouse name: Not on file   Number of children: Not on file   Years of education: Not on file   Highest education level: Not on file  Occupational History   Not on file  Tobacco Use   Smoking status: Never   Smokeless tobacco: Never  Vaping Use   Vaping Use: Never used  Substance and Sexual Activity   Alcohol use: Not Currently   Drug use: No   Sexual activity: Yes  Birth control/protection: None  Other Topics Concern   Not on file  Social History Narrative   Not on file   Social Determinants of Health   Financial Resource Strain: Not on file  Food Insecurity: Not on file  Transportation Needs: Not on file  Physical Activity: Not on file  Stress: Not on file  Social Connections: Not on file   Additional Social History:       Sleep: Good  Appetite:  Good  Current  Medications: Current Facility-Administered Medications  Medication Dose Route Frequency Provider Last Rate Last Admin   acetaminophen (TYLENOL) tablet 650 mg  650 mg Oral Q6H PRN Chalmers Guest, NP       alum & mag hydroxide-simeth (MAALOX/MYLANTA) 200-200-20 MG/5ML suspension 30 mL  30 mL Oral Q4H PRN Chalmers Guest, NP       amLODipine (NORVASC) tablet 10 mg  10 mg Oral Daily Chalmers Guest, NP   10 mg at 02/28/21 [id number]   aspirin EC tablet 81 mg  81 mg Oral Daily Chalmers Guest, NP   81 mg at 02/28/21 1249   atenolol (TENORMIN) tablet 12.5 mg  12.5 mg Oral Daily France Ravens, MD   12.5 mg at 02/28/21 0803   benztropine (COGENTIN) tablet 1 mg  1 mg Oral BID Chalmers Guest, NP   1 mg at 02/28/21 1727   cloNIDine (CATAPRES) tablet 0.2 mg  0.2 mg Oral BID Massengill, Ovid Curd, MD   0.2 mg at 02/28/21 1727   cloZAPine (CLOZARIL) tablet 50 mg  50 mg Oral BID France Ravens, MD   50 mg at 02/28/21 1726   glipiZIDE (GLUCOTROL XL) 24 hr tablet 20 mg  20 mg Oral Q breakfast France Ravens, MD   20 mg at 02/28/21 0804   haloperidol (HALDOL) tablet 10 mg  10 mg Oral BID France Ravens, MD   10 mg at 02/28/21 1726   insulin aspart (novoLOG) injection 0-15 Units  0-15 Units Subcutaneous TID WC Katalin Colledge, Jackie Plum, MD   5 Units at 03/01/21 0647   insulin aspart (novoLOG) injection 0-5 Units  0-5 Units Subcutaneous QHS Chalmers Guest, NP   3 Units at 02/28/21 2051   insulin aspart protamine- aspart (NOVOLOG MIX 70/30) injection 48 Units  48 Units Subcutaneous Q breakfast France Ravens, MD   48 Units at 02/28/21 0809   And   insulin aspart protamine- aspart (NOVOLOG MIX 70/30) injection 42 Units  42 Units Subcutaneous Q supper France Ravens, MD   42 Units at 02/28/21 1730   lisinopril (ZESTRIL) tablet 40 mg  40 mg Oral Daily Chalmers Guest, NP   40 mg at 02/28/21 0803   lithium carbonate (ESKALITH) CR tablet 450 mg  450 mg Oral Q12H Chalmers Guest, NP   450 mg at 02/28/21 2052   magnesium hydroxide (MILK OF MAGNESIA) suspension 30  mL  30 mL Oral Daily PRN Chalmers Guest, NP       OLANZapine zydis (ZYPREXA) disintegrating tablet 10 mg  10 mg Oral Q8H PRN Chalmers Guest, NP   10 mg at 02/03/21 2342   And   ziprasidone (GEODON) injection 20 mg  20 mg Intramuscular PRN Chalmers Guest, NP       polyethylene glycol (MIRALAX / GLYCOLAX) packet 17 g  17 g Oral Daily PRN Ethelene Hal, NP       senna-docusate (Senokot-S) tablet 1 tablet  1 tablet Oral Daily Corky Sox, MD   1  tablet at 02/28/21 0805   traZODone (DESYREL) tablet 50 mg  50 mg Oral Aliene Altes, MD   50 mg at 02/28/21 2052    Lab Results:  Results for orders placed or performed during the hospital encounter of 01/28/21 (from the past 48 hour(s))  Glucose, capillary     Status: Abnormal   Collection Time: 02/27/21 12:00 PM  Result Value Ref Range   Glucose-Capillary 182 (H) 70 - 99 mg/dL    Comment: Glucose reference range applies only to samples taken after fasting for at least 8 hours.  Glucose, capillary     Status: Abnormal   Collection Time: 02/27/21  4:53 PM  Result Value Ref Range   Glucose-Capillary 372 (H) 70 - 99 mg/dL    Comment: Glucose reference range applies only to samples taken after fasting for at least 8 hours.  Glucose, capillary     Status: Abnormal   Collection Time: 02/27/21  7:52 PM  Result Value Ref Range   Glucose-Capillary 247 (H) 70 - 99 mg/dL    Comment: Glucose reference range applies only to samples taken after fasting for at least 8 hours.  Glucose, capillary     Status: Abnormal   Collection Time: 02/28/21  5:46 AM  Result Value Ref Range   Glucose-Capillary 186 (H) 70 - 99 mg/dL    Comment: Glucose reference range applies only to samples taken after fasting for at least 8 hours.  Glucose, capillary     Status: Abnormal   Collection Time: 02/28/21 11:57 AM  Result Value Ref Range   Glucose-Capillary 217 (H) 70 - 99 mg/dL    Comment: Glucose reference range applies only to samples taken after fasting for at  least 8 hours.   Comment 1 Notify RN    Comment 2 Document in Chart   Glucose, capillary     Status: Abnormal   Collection Time: 02/28/21  5:12 PM  Result Value Ref Range   Glucose-Capillary 239 (H) 70 - 99 mg/dL    Comment: Glucose reference range applies only to samples taken after fasting for at least 8 hours.  Glucose, capillary     Status: Abnormal   Collection Time: 02/28/21  8:35 PM  Result Value Ref Range   Glucose-Capillary 278 (H) 70 - 99 mg/dL    Comment: Glucose reference range applies only to samples taken after fasting for at least 8 hours.  Glucose, capillary     Status: Abnormal   Collection Time: 03/01/21  6:24 AM  Result Value Ref Range   Glucose-Capillary 217 (H) 70 - 99 mg/dL    Comment: Glucose reference range applies only to samples taken after fasting for at least 8 hours.   Comment 1 Notify RN    Comment 2 Document in Chart     Blood Alcohol level:  Lab Results  Component Value Date   ETH <10 01/26/2021   ETH <10 [id number]    Metabolic Disorder Labs: Lab Results  Component Value Date   HGBA1C 11.1 (H) 01/26/2021   MPG 271.87 01/26/2021   MPG 202.99 01/07/2019   No results found for: PROLACTIN Lab Results  Component Value Date   CHOL 140 01/30/2021   TRIG 95 01/30/2021   HDL 49 01/30/2021   CHOLHDL 2.9 01/30/2021   VLDL 19 01/30/2021   LDLCALC 72 01/30/2021   LDLCALC 67 05/20/2020    Physical Findings:   Musculoskeletal: Strength & Muscle Tone: within normal limits Gait & Station: normal Patient leans: N/A  Psychiatric Specialty Exam:  Presentation  General Appearance: Casual Eye Contact:Fair  Speech:Clear and Coherent; Normal Rate  Speech Volume:Normal  Handedness:Right   Mood and Affect  Mood:Euthymic  Affect:Congruent   Thought Process  Thought Processes:Coherent  Descriptions of Associations:Intact  Orientation:Full (Time, Place and Person)  Thought Content:continues to have grandiose delusions and possible  paranoid delusions regarding name  History of Schizophrenia/Schizoaffective disorder:Yes  Duration of Psychotic Symptoms:Greater than six months  Hallucinations: none Ideas of Reference:None  Suicidal Thoughts: none Homicidal Thoughts:none  Sensorium  Memory:Immediate Fair; Recent Fair  Judgment:Fair  Insight:Fair   Executive Functions  Concentration:Fair  Attention Span:Fair  [place]  Language:Good   Psychomotor Activity  Psychomotor Activity: normal  Assets  Assets:Communication Skills; Resilience   Sleep  Sleep:No data recorded   Physical Exam: Physical Exam Vitals and nursing note reviewed.  Constitutional:      General: He is not in acute distress.    Appearance: Normal appearance. He is obese. He is not ill-appearing or toxic-appearing.  HENT:     Head: Normocephalic and atraumatic.  Pulmonary:     Effort: Pulmonary effort is normal.  Musculoskeletal:        General: Normal range of motion.  Neurological:     General: No focal deficit present.     Mental Status: He is alert.   Review of Systems  Respiratory:  Negative for cough and shortness of breath.   Cardiovascular:  Negative for chest pain.  Gastrointestinal:  Negative for abdominal pain, constipation, diarrhea, nausea and vomiting.  Neurological:  Negative for weakness and headaches.  Psychiatric/Behavioral:  Negative for depression, hallucinations and suicidal ideas. The patient is not nervous/anxious.    Blood pressure 118/80, pulse 99, temperature 98.4 F (36.9 C), temperature source Oral, resp. rate 16, height 5' 3.5" (1.613 m), weight 91.6 kg, SpO2 99 %. Body mass index is 35.22 kg/m.   Treatment Plan Summary: Daily contact with patient to assess and evaluate symptoms and progress in treatment and Medication management  Mitchell [name] is a 60 year old male with a PPHx of Schizophrenia and multiple hospitalizations presents under IVC for acute  psychosis.   Patient appears less sedated today. Referrals to [facility] is pending per CSW to ensure he has support to safely return to his apartment.  Patient will have a home appointment early next week.  Otherwise, we will need to consider recommending group home given patient's diabetes and CBG ranging from 150s to 400s. We will continue to adjust insulin dosage to account for blood glucose. No psychiatric medication changes at this time.   Schizophrenia: -Continue Haldol 10 mg BID -Continue Clozaril 50 mg BID -Continue Lithium 450 mg BID (Lithium level 0.70 02/23/2021) -Continue Senokot-S daily -Continue Cogentin 1 mg BID for EPS prophylaxis -Continue Atenolol 12.5 mg daily -Clozaril labs 11/28 completed (next set due 12/5) -CBC on day of discharge  HTN: -Continue Lisinopril 40 mg daily -Continue Amlodipine 10 mg daily -Continue Clonidine 0.2 mg BID   Type 2 Diabetes: -Per Diabetic Coordinator, plan to adjust insulin coverage to account for discharge. Appreciate recs  -Discontinue Semglee, and Novolog meal coverage -INCREASE 70/30 Insulin 48 units with breakfast and 42 units with  supper  -Continue Glipizide 20 mg daily starting tomorrow 11/30 -Continue Novlog 0-15 units tid for meals and nighttime coverage   ABD distention, transaminitis: According staff this has increased since admission. KUB shows non-obstructive gas pattern with no obvious stool burden. ABD exam benign except for protuberant abdomen. ALT persistently elevated  to 62. Concern was for ascites despite negative fluid wave. RUQ [place] negative for hepatic parenchymal disease or biliary disease. For ascites causes, ruled out parenchymal disease of the liver, CHF (BNP WNL), and patient likely does not have cancer. Ammonia WNL. Lipase slightly elevated.  -Hepatitis panel negative  -Continue PRN's: Tylenol, Maalox, Atarax, Milk of Magnesia, Trazodone   Maida Sale, MD 03/01/2021, 7:32 AM

## 2021-03-02 ENCOUNTER — Encounter (HOSPITAL_COMMUNITY): Payer: Self-pay

## 2021-03-02 LAB — GLUCOSE, CAPILLARY
Glucose-Capillary: 175 mg/dL — ABNORMAL HIGH (ref 70–99)
Glucose-Capillary: 280 mg/dL — ABNORMAL HIGH (ref 70–99)

## 2021-03-02 LAB — CBC WITH DIFFERENTIAL/PLATELET
Abs Immature Granulocytes: 0.08 10*3/uL — ABNORMAL HIGH (ref 0.00–0.07)
Basophils Absolute: 0.1 10*3/uL (ref 0.0–0.1)
Basophils Relative: 1 %
Eosinophils Absolute: 0.5 10*3/uL (ref 0.0–0.5)
Eosinophils Relative: 6 %
HCT: 41.5 % (ref 39.0–52.0)
Hemoglobin: 13.8 g/dL (ref 13.0–17.0)
Immature Granulocytes: 1 %
Lymphocytes Relative: 35 %
Lymphs Abs: 2.7 10*3/uL (ref 0.7–4.0)
MCH: 31.2 pg (ref 26.0–34.0)
MCHC: 33.3 g/dL (ref 30.0–36.0)
MCV: 93.7 fL (ref 80.0–100.0)
Monocytes Absolute: 0.7 10*3/uL (ref 0.1–1.0)
Monocytes Relative: 9 %
Neutro Abs: 3.7 10*3/uL (ref 1.7–7.7)
Neutrophils Relative %: 48 %
Platelets: 243 10*3/uL (ref 150–400)
RBC: 4.43 MIL/uL (ref 4.22–5.81)
RDW: 12.5 % (ref 11.5–15.5)
WBC: 7.6 10*3/uL (ref 4.0–10.5)
nRBC: 0 % (ref 0.0–0.2)

## 2021-03-02 LAB — COMPREHENSIVE METABOLIC PANEL
ALT: 58 U/L — ABNORMAL HIGH (ref 0–44)
AST: 33 U/L (ref 15–41)
Albumin: 3.9 g/dL (ref 3.5–5.0)
Alkaline Phosphatase: 146 U/L — ABNORMAL HIGH (ref 38–126)
Anion gap: 6 (ref 5–15)
BUN: 21 mg/dL — ABNORMAL HIGH (ref 6–20)
CO2: 27 mmol/L (ref 22–32)
Calcium: 9.3 mg/dL (ref 8.9–10.3)
Chloride: 100 mmol/L (ref 98–111)
Creatinine, Ser: 1.16 mg/dL (ref 0.61–1.24)
GFR, Estimated: >60 mL/min (ref >60)
Glucose, Bld: 204 mg/dL — ABNORMAL HIGH (ref 70–99)
Potassium: 4.4 mmol/L (ref 3.5–5.1)
Sodium: 133 mmol/L — ABNORMAL LOW (ref 135–145)
Total Bilirubin: 0.4 mg/dL (ref 0.3–1.2)
Total Protein: 8 g/dL (ref 6.5–8.1)

## 2021-03-02 LAB — TROPONIN I (HIGH SENSITIVITY): Troponin I (High Sensitivity): <2 ng/L (ref <18)

## 2021-03-02 LAB — C-REACTIVE PROTEIN: CRP: 1.7 mg/dL — ABNORMAL HIGH (ref <1.0)

## 2021-03-02 MED ORDER — CLOZAPINE 50 MG PO TABS: 50.0000 mg | ORAL_TABLET | Freq: Two times a day (BID) | ORAL | 0 refills | Status: DC

## 2021-03-02 MED ORDER — NOVOLOG MIX 70/30 FLEXPEN (70-30) 100 UNIT/ML ~~LOC~~ SUPN
42.0000 [IU] | PEN_INJECTOR | Freq: Every day | SUBCUTANEOUS | 0 refills | Status: DC
Start: 2021-03-02 — End: 2021-04-23

## 2021-03-02 MED ORDER — SENNOSIDES-DOCUSATE SODIUM 8.6-50 MG PO TABS
1.0000 | ORAL_TABLET | Freq: Every day | ORAL | 0 refills | Status: AC
Start: 2021-03-02 — End: 2021-04-01

## 2021-03-02 MED ORDER — TRAZODONE HCL 50 MG PO TABS: 50.0000 mg | ORAL_TABLET | Freq: Every day | ORAL | 0 refills | Status: DC

## 2021-03-02 MED ORDER — GLIPIZIDE ER 10 MG PO TB24: 20.0000 mg | ORAL_TABLET | Freq: Every day | ORAL | 0 refills | Status: DC

## 2021-03-02 MED ORDER — ATENOLOL 25 MG PO TABS: 12.5000 mg | ORAL_TABLET | Freq: Every day | ORAL | 0 refills | Status: DC

## 2021-03-02 MED ORDER — NOVOLOG MIX 70/30 FLEXPEN (70-30) 100 UNIT/ML ~~LOC~~ SUPN: 48.0000 [IU] | PEN_INJECTOR | Freq: Every day | SUBCUTANEOUS | 0 refills | Status: DC

## 2021-03-02 NOTE — Plan of Care (Signed)
Patient minimally engaged in recreation therapy groups with encouragement from staff.   Caroll Rancher, LRT,CTRS

## 2021-03-02 NOTE — Group Note (Signed)
[facility] LCSW Group Therapy   03/02/2021 at 1:00pm   Type of Therapy:  Group Therapy: Value Clarification      Participation Level: Did not attend   Description of Group:  In this group, patients will learn to explore, identify, and clarify the values that influence their decisions and behavior and encourages them to build on their inner resources and strengths. This group will be clarifying, exporational and eductional, with patients participating in identifying their own values, identifying others values, and identifying societal values and their impact on their lives.      Therapeutic Goals: Patient will learn to identify values. Patient will learn to identify others values. Patient will learn ways to use their values in decisions they make in their daily lives. Patient will receive support and feedback from others     Therapeutic Modalities:  Acceptance and Commitment Therapy      Summary of Progress/Problems: Did not attend   Ruthann Cancer MSW, LCSW Clincal Social Worker  [facility]

## 2021-03-02 NOTE — Discharge Summary (Signed)
Physician Discharge Summary Note  Patient:  Mitchell [name] is an 60 y.o., male MRN:  [id number] DOB:  [birth year] Patient phone:  [phone] (home)  Patient address:   [place] [address],  Total Time spent with patient: 1 hour  Date of Admission:  01/28/2021 Date of Discharge: 03/02/2021  Reason for Admission:  Mitchell [name] is a 60 year old male with a PPHx of Schizophrenia and multiple hospitalizations presents under IVC for acute psychosis.  PER H&P On interview at [facility] on 11/3, the patient appears psychotic.  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  His speech is garbled and he is difficult to understand.  He reports that his mood is "great" and that he has been taking all of his psychiatric medication.  However he told the person completing the med rec earlier that he was not taking any of his medication before admission.  He denies suicidal thoughts and auditory/visual hallucinations.  He does not appear to be responding to internal stimuli at the time of the interview.  However, he is found later talking to himself in his room.   The patient consents for this interviewer to reach out to his daughter Eduard Roux.  Unfortunately she was not able to be reached at the number listed in the chart.  This interviewer also reached out to the petitioner in the IVC, however, she was not able to be reached.  The remainder of the information in this HPI is obtained through chart review.   He has numerous emergency department visits for suicidal ideation as well as psychosis.  He was most recently admitted at a [place] facility in 2020 but had an admission to [facility] at some point within the past 2 years.  At the admission in 2020 he exhibited disorganized thought process and was continued on his home haloperidol 20 mg daily and  lithium 450 mg twice daily and was discharged 6 days later.  Principal Problem: Schizophrenia [facility]) Discharge Diagnoses: Principal Problem:   Schizophrenia ([place]) Active Problems:   Type 2 diabetes mellitus ([place])   Past Psychiatric History: see H&P  Past Medical History:  Past Medical History:  Diagnosis Date   Bipolar affective disorder ([place])    takes Zyprexa daily   Diabetes mellitus    takes Victoza,Metformin,and Glipizide daily   Hypertension    takes Amlodipine,Lisinopril and Clonidine daily   Hyponatremia    history of   Mental disorder    takes Lithium daily   Schizoaffective disorder    takes Trazodone nightly   Seasonal allergies    takes Claritin daily   Sleep apnea    sleep study >57yr ago   Stroke ([facility]    left arm weakness    Past Surgical History:  Procedure Laterality Date   CATARACT EXTRACTION W/PHACO Right 02/14/2013   Procedure: CATARACT EXTRACTION PHACO AND INTRAOCULAR LENS PLACEMENT ([place];  Surgeon: GAdonis Brook MD;  Location: [facility]  Service: Ophthalmology;  Laterality: Right;   CATARACT EXTRACTION W/PHACO Left 06/13/2013   Procedure: CATARACT EXTRACTION PHACO AND INTRAOCULAR LENS PLACEMENT (I[facility]);  Surgeon: GAdonis Brook MD;  Location: [facility]  Service: Ophthalmology;  Laterality: Left;   CIRCUMCISION  20 yrs. ago   EYE SURGERY     Family History: History reviewed. No pertinent family history. Family Psychiatric  History: see H&P Social  History:  Social History   Substance and Sexual Activity  Alcohol Use Not Currently     Social History   Substance and Sexual Activity  Drug Use No    Social History   Socioeconomic History   Marital status: Divorced    Spouse name: Not on file   Number of children: Not on file   Years of education: Not on file   Highest education level: Not on file  Occupational History   Not on file  Tobacco Use   Smoking status: Never   Smokeless tobacco: Never  Vaping Use   Vaping Use: Never used  Substance  and Sexual Activity   Alcohol use: Not Currently   Drug use: No   Sexual activity: Yes    Birth control/protection: None  Other Topics Concern   Not on file  Social History Narrative   Not on file   Social Determinants of Health   Financial Resource Strain: Not on file  Food Insecurity: Not on file  Transportation Needs: Not on file  Physical Activity: Not on file  Stress: Not on file  Social Connections: Not on file    Hospital Course:   After the above admission evaluation, patient's presenting symptoms were noted. Patient was recommended for antipsychotic treatment. The medication regimen targeting those presenting symptoms were discussed with patient & initiated with patient's consent. Patient was initially continued on Haldol 20 mg nightly and Cogentin 1 mg twice daily as well as lithium 450 mg twice daily.  Patient was later switched over to clozapine due to persistent psychosis but general medication compliance.  Clozapine was titrated up to 50 mg twice daily.  Patient required initial increase in haloperidol due to refractory psychosis while uptitrating clozapine.  However, patient was able to remain psychiatrically stable once at a higher dose of clozapine so haloperidol was lowered back to the 10 mg twice daily.  Patient's diabetes regiment was modified accordingly per diabetes coordinator recommendations as well as according to [place].  Patient had no complaints throughout the hospitalization and felt that the medication regiment was compliant. Patient endorsed feeling great and appreciative of healthcare team support for patient.  Pertinent labs drawn during hospitalizations include: Troponin less than 2, CRP 1.7, BUN 21, ALT 58, alk phos 146, CBC WNL.  Patient's abnormal labs were stable throughout hospitalization with the exception of patient's CBG.  Patient's CBG ranged from mid 100s to 300 following new insulin regimen.   During the course of patient's hospitalization, the  15-minute checks were adequate to ensure patient's safety. Patient did not exhibit erratic or aggressive behavior and was compliant with scheduled medication. Patient was recommended for outpatient psychiatry and therapy.  At the time of discharge patient is not reporting any acute suicidal/homicidal ideations/AVH, delusional thoughts or paranoia. Patient did not appear to be responding to any internal stimuli. Patient feels more confident about self-care & in managing their mental health problems. Patient currently denies any new issues or concerns. Education and supportive counseling provided throughout patient's hospital stay & upon discharge.   Today upon discharge evaluation with the attending psychiatrist Dr. Caswell Corwin, patient's mood is " great". Patient denies any specific concerns. Patient slept well, appetite good, regular bowel movements. Patient denies any physical complaints. Patient feels that the medications have been helpful & is in agreement to continue current treatment regimen as recommended. Patient was able to engage in safety planning including plan to return to [facility] or contact emergency services if patient feels unable to maintain their  own safety or the safety of others. Patient had no further questions, comments, or concerns. Patient left [facility] with all personal belongings in no apparent distress. Transportation per safe transport to patient's apartment was arranged for patient.   Physical Findings: AIMS: Facial and Oral Movements Muscles of Facial Expression: None, normal Lips and Perioral Area: None, normal Jaw: None, normal Tongue: None, normal,Extremity Movements Upper (arms, wrists, hands, fingers): None, normal Lower (legs, knees, ankles, toes): None, normal, Trunk Movements Neck, shoulders, hips: None, normal, Overall Severity Severity of abnormal movements (highest score from questions above): None, normal Incapacitation due to abnormal movements: None, normal Patient's  awareness of abnormal movements (rate only patient's report): No Awareness, Dental Status Current problems with teeth and/or dentures?: No Does patient usually wear dentures?: No    Musculoskeletal: Strength & Muscle Tone: within normal limits Gait & Station: normal Patient leans: N/A   Psychiatric Specialty Exam:  Presentation  General Appearance: Appropriate for Environment; Casual; Fairly Groomed  Eye Contact:Good  Speech:Normal Rate  Speech Volume:Normal  Handedness:Right   Mood and Affect  Mood:Euthymic  Affect:Appropriate; Congruent; Full Range   Thought Process  Thought Processes:Coherent; Linear  Descriptions of Associations:Intact  Orientation:Full (Time, Place and Person)  Thought Content:Logical  History of Schizophrenia/Schizoaffective disorder:Yes  Duration of Psychotic Symptoms:Greater than six months  Hallucinations:Hallucinations: None  Ideas of Reference:-- (no spontaneous delusions reported today)  Suicidal Thoughts:Suicidal Thoughts: No  Homicidal Thoughts:Homicidal Thoughts: No   Sensorium  Memory:Immediate Good; Recent Good; Remote Good  Judgment:Good  Insight:Good   Executive Functions  Concentration:Good  Attention Span:Good  [place]  Language:Good   Psychomotor Activity  Psychomotor Activity:Psychomotor Activity: Normal   Assets  Assets:Communication Skills; Resilience   Sleep  Sleep:Sleep: Good    Physical Exam: Physical Exam ROS Blood pressure 124/81, pulse 97, temperature 98.5 F (36.9 C), temperature source Oral, resp. rate 18, height 5' 3.5" (1.613 m), weight 91.6 kg, SpO2 100 %. Body mass index is 35.22 kg/m.   Social History   Tobacco Use  Smoking Status Never  Smokeless Tobacco Never   Tobacco Cessation:  N/A, patient does not currently use tobacco products   Blood Alcohol level:  Lab Results  Component Value Date   ETH <10 01/26/2021   ETH <10  46/80/3212    Metabolic Disorder Labs:  Lab Results  Component Value Date   HGBA1C 11.1 (H) 01/26/2021   MPG 271.87 01/26/2021   MPG 202.99 01/07/2019   No results found for: PROLACTIN Lab Results  Component Value Date   CHOL 140 01/30/2021   TRIG 95 01/30/2021   HDL 49 01/30/2021   CHOLHDL 2.9 01/30/2021   VLDL 19 01/30/2021   LDLCALC 72 01/30/2021   LDLCALC 67 05/20/2020    See Psychiatric Specialty Exam and Suicide Risk Assessment completed by Attending Physician prior to discharge.  Discharge destination:  Home  Is patient on multiple antipsychotic therapies at discharge:  Yes,   Do you recommend tapering to monotherapy for antipsychotics?  No   Has Patient had three or more failed trials of antipsychotic monotherapy by history:  No  Recommended Plan for Multiple Antipsychotic Therapies: Second antipsychotic is Clozapine.  Reason for adding Clozapine Patient continued to be psychotic with increased dosage of haloperidol requiring patient to be on clozapine. Patient should be continued on both haloperidol and clozapine as patient has had minimal behavioral problems at the dosages he is at upon discharge.   Allergies as of 03/02/2021   No Known Allergies  Medication List     STOP taking these medications    fluticasone 50 MCG/ACT nasal spray Commonly known as: FLONASE   insulin glargine 100 UNIT/ML injection Commonly known as: LANTUS   insulin lispro 100 UNIT/ML KwikPen Commonly known as: HUMALOG   lactulose 10 GM/15ML solution Commonly known as: CHRONULAC   Levemir FlexTouch 100 UNIT/ML FlexPen Generic drug: insulin detemir   liraglutide 18 MG/3ML Sopn Commonly known as: VICTOZA   metFORMIN 1000 MG tablet Commonly known as: GLUCOPHAGE   NovoLOG FlexPen 100 UNIT/ML FlexPen Generic drug: insulin aspart   tobramycin-dexamethasone ophthalmic solution Commonly known as: TOBRADEX       TAKE these medications      Indication  amLODipine 10  MG tablet Commonly known as: NORVASC Take 1 tablet (10 mg total) by mouth daily.  Indication: High Blood Pressure Disorder   aspirin 81 MG EC tablet Take 1 tablet (81 mg total) by mouth daily. Swallow whole.  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  Indication: High Blood Pressure Disorder   benztropine 1 MG tablet Commonly known as: COGENTIN Take 1 tablet (1 mg total) by mouth 2 (two) times daily.  Indication: Extrapyramidal Reaction caused by Medications   cloNIDine 0.2 MG tablet Commonly known as: CATAPRES Take 0.2 mg by mouth 2 (two) times daily.  Indication: High Blood Pressure Disorder   clozapine 50 MG tablet Commonly known as: CLOZARIL Take 1 tablet (50 mg total) by mouth 2 (two) times daily.  Indication: Schizophrenia that does Not Respond to Usual Drug Therapy   glipiZIDE 10 MG 24 hr tablet Commonly known as: GLUCOTROL XL Take 2 tablets (20 mg total) by mouth daily with breakfast. What changed: how much to take  Indication: Type 2 Diabetes   haloperidol 20 MG tablet Commonly known as: HALDOL Take 1 tablet (20 mg total) by mouth at bedtime.  Indication: Psychosis   lisinopril 40 MG tablet Commonly known as: ZESTRIL Take 1 tablet (40 mg total) by mouth daily.  Indication: High Blood Pressure Disorder   lithium carbonate 450 MG CR tablet Commonly known as: ESKALITH Take 1 tablet (450 mg total) by mouth every 12 (twelve) hours.  Indication: Schizoaffective Disorder   NovoLOG Mix 70/30 FlexPen (70-30) 100 UNIT/ML FlexPen Generic drug: insulin aspart protamine - aspart Inject 48 Units into the skin daily with breakfast.  Indication: Type 2 Diabetes   NovoLOG Mix 70/30 FlexPen (70-30) 100 UNIT/ML FlexPen Generic drug: insulin aspart protamine - aspart Inject 42 Units into the skin daily with supper.  Indication: Type 2  Diabetes   senna-docusate 8.6-50 MG tablet Commonly known as: Senokot-S Take 1 tablet by mouth daily.  Indication: Constipation   traZODone 50 MG tablet Commonly known as: DESYREL Take 1 tablet (50 mg total) by mouth at bedtime.  Indication: Trouble Sleeping        Follow-up Information     [employer]. Call.   Why: Please notify this provider that you have discharged and they will schedule you for all of your community support team services including therapy, medication management, peer support and case management services. Contact information: [address], [place] [address]  P: [place]. Go on 03/17/2021.   Why: You have a hospital follow up appointment for primary care services on 03/17/21  at 9:15 am.   This appointment will be held in person. Contact information: [place] [address] [place], [place] Follow up.   Why: A referral has been made on your behalf for community services to support your medical needs at home. [facility] will be reaching out to you over the phone and will schedule a home visit to determine the level of care needed. Contact information: [place] [address] [phone]                  Follow-up recommendations:   Activity:  as tolerated Diet:  heart healthy   Comments:  Prescriptions were given at discharge.  Patient is agreeable with the discharge plan.  Patient was given an opportunity to ask questions.  Patient appears to feel comfortable with discharge and denies any current suicidal or homicidal thoughts.    Patient is instructed prior to discharge to: Take all medications as prescribed by mental healthcare provider. Report any adverse effects and or reactions from the medicines to outpatient provider promptly. In the event of worsening symptoms,  patient is instructed to call the crisis hotline, 911 and or go to the nearest ED for appropriate evaluation and treatment of symptoms. Patient is to follow-up with primary care provider for other medical issues, concerns and or health care needs.   Signed: France Ravens, MD 03/02/2021, 1:34 PM

## 2021-03-02 NOTE — Progress Notes (Signed)
Patient has been up and active on the unit, attended group this evening and has voiced no complaints. Patient currently denies having pain, -si/hi/a/v hall. Support and encouragement offered, safety maintained on unit, will continue to monitor.  

## 2021-03-02 NOTE — BH IP Treatment Plan (Signed)
Interdisciplinary Treatment and Diagnostic Plan Update  03/02/2021 Time of Session: 10:30am Mitchell [name] MRN: [id number]  Principal Diagnosis: Schizophrenia [facility])  Secondary Diagnoses: Principal Problem:   Schizophrenia (HCC) Active Problems:   Type 2 diabetes mellitus (HCC)   Current Medications:  Current Facility-Administered Medications  Medication Dose Route Frequency Provider Last Rate Last Admin   acetaminophen (TYLENOL) tablet 650 mg  650 mg Oral Q6H PRN Novella Olive, NP       alum & mag hydroxide-simeth (MAALOX/MYLANTA) 200-200-20 MG/5ML suspension 30 mL  30 mL Oral Q4H PRN Novella Olive, NP       amLODipine (NORVASC) tablet 10 mg  10 mg Oral Daily Novella Olive, NP   10 mg at 03/02/21 0844   aspirin EC tablet 81 mg  81 mg Oral Daily Novella Olive, NP   81 mg at 03/02/21 0845   atenolol (TENORMIN) tablet 12.5 mg  12.5 mg Oral Daily Park Pope, MD   12.5 mg at 03/02/21 0845   benztropine (COGENTIN) tablet 1 mg  1 mg Oral BID Novella Olive, NP   1 mg at 03/02/21 3154   cloNIDine (CATAPRES) tablet 0.2 mg  0.2 mg Oral BID Massengill, Harrold Donath, MD   0.2 mg at 03/02/21 0846   cloZAPine (CLOZARIL) tablet 50 mg  50 mg Oral BID Park Pope, MD   50 mg at 03/02/21 0846   glipiZIDE (GLUCOTROL XL) 24 hr tablet 20 mg  20 mg Oral Q breakfast Park Pope, MD   20 mg at 03/02/21 0845   haloperidol (HALDOL) tablet 10 mg  10 mg Oral BID Park Pope, MD   10 mg at 03/02/21 0844   insulin aspart (novoLOG) injection 0-15 Units  0-15 Units Subcutaneous TID WC Roselle Locus, MD   3 Units at 03/02/21 0610   insulin aspart (novoLOG) injection 0-5 Units  0-5 Units Subcutaneous QHS Novella Olive, NP   4 Units at 03/01/21 2101   insulin aspart protamine- aspart (NOVOLOG MIX 70/30) injection 48 Units  48 Units Subcutaneous Q breakfast Park Pope, MD   48 Units at 03/02/21 0844   And   insulin aspart protamine- aspart (NOVOLOG MIX 70/30) injection 42 Units  42 Units Subcutaneous Q supper Park Pope, MD   42 Units at 03/01/21 1721   lisinopril (ZESTRIL) tablet 40 mg  40 mg Oral Daily Novella Olive, NP   40 mg at 03/02/21 0845   lithium carbonate (ESKALITH) CR tablet 450 mg  450 mg Oral Q12H Novella Olive, NP   450 mg at 03/02/21 0846   magnesium hydroxide (MILK OF MAGNESIA) suspension 30 mL  30 mL Oral Daily PRN Novella Olive, NP       OLANZapine zydis (ZYPREXA) disintegrating tablet 10 mg  10 mg Oral Q8H PRN Novella Olive, NP   10 mg at 02/03/21 2342   And   ziprasidone (GEODON) injection 20 mg  20 mg Intramuscular PRN Novella Olive, NP       polyethylene glycol (MIRALAX / GLYCOLAX) packet 17 g  17 g Oral Daily PRN Laveda Abbe, NP       senna-docusate (Senokot-S) tablet 1 tablet  1 tablet Oral Daily Carlyn Reichert, MD   1 tablet at 03/02/21 0846   traZODone (DESYREL) tablet 50 mg  50 mg Oral Seabron Spates, MD   50 mg at 03/01/21 2101   PTA Medications: Medications Prior to Admission  Medication Sig Dispense Refill Last  Dose   amLODipine (NORVASC) 10 MG tablet Take 1 tablet (10 mg total) by mouth daily. (Patient not taking: Reported on 01/26/2021) 90 tablet 1    aspirin 81 MG EC tablet Take 1 tablet (81 mg total) by mouth daily. Swallow whole. 30 tablet 12    benztropine (COGENTIN) 1 MG tablet Take 1 tablet (1 mg total) by mouth 2 (two) times daily. (Patient not taking: Reported on 01/26/2021) 60 tablet 2    cloNIDine (CATAPRES) 0.2 MG tablet Take 0.2 mg by mouth 2 (two) times daily. (Patient not taking: Reported on 01/26/2021)      fluticasone (FLONASE) 50 MCG/ACT nasal spray Place 1 spray into both nostrils daily as needed for allergies.  (Patient not taking: Reported on 01/26/2021)      glipiZIDE (GLUCOTROL XL) 10 MG 24 hr tablet Take 1 tablet (10 mg total) by mouth daily with breakfast. 90 tablet 1    haloperidol (HALDOL) 20 MG tablet Take 1 tablet (20 mg total) by mouth at bedtime. (Patient not taking: Reported on 01/26/2021) 90 tablet 1    insulin glargine  (LANTUS) 100 UNIT/ML injection Inject 10 Units into the skin daily.      insulin lispro (HUMALOG) 100 UNIT/ML KwikPen Inject 2-10 Units into the skin 3 (three) times daily. Per sliding scale (Patient not taking: Reported on 01/26/2021)      lactulose (CHRONULAC) 10 GM/15ML solution Take 30 mLs by mouth daily as needed for mild constipation.  (Patient not taking: Reported on 01/26/2021)      LEVEMIR FLEXTOUCH 100 UNIT/ML FlexPen Inject 20 Units into the skin at bedtime. (Patient not taking: Reported on 01/26/2021)      liraglutide (VICTOZA) 18 MG/3ML SOPN Inject 1.8 mg into the skin daily. (Patient not taking: Reported on 01/26/2021)      lisinopril (ZESTRIL) 40 MG tablet Take 1 tablet (40 mg total) by mouth daily. (Patient not taking: No sig reported) 90 tablet 1    lithium carbonate (ESKALITH) 450 MG CR tablet Take 1 tablet (450 mg total) by mouth every 12 (twelve) hours. (Patient not taking: Reported on 01/26/2021) 60 tablet 2    metFORMIN (GLUCOPHAGE) 1000 MG tablet Take 1 tablet (1,000 mg total) by mouth 2 (two) times daily with a meal. (Patient not taking: Reported on 01/26/2021) 60 tablet 2    NOVOLOG FLEXPEN 100 UNIT/ML FlexPen Inject 5 Units into the skin with breakfast, with lunch, and with evening meal. (Patient not taking: Reported on 01/26/2021)      tobramycin-dexamethasone (TOBRADEX) ophthalmic solution Place 2 drops into both eyes every 6 (six) hours as needed. (Patient not taking: Reported on 01/26/2021)       Patient Stressors: Health problems   Medication change or noncompliance    Patient Strengths: Capable of independent living  Supportive family/friends   Treatment Modalities: Medication Management, Group therapy, Case management,  1 to 1 session with clinician, Psychoeducation, Recreational therapy.   Physician Treatment Plan for Primary Diagnosis: Schizophrenia (HCC) Long Term Goal(s): Improvement in symptoms so as ready for discharge   Short Term Goals: Compliance  with prescribed medications will improve Ability to maintain clinical measurements within normal limits will improve  Medication Management: Evaluate patient's response, side effects, and tolerance of medication regimen.  Therapeutic Interventions: 1 to 1 sessions, Unit Group sessions and Medication administration.  Evaluation of Outcomes: Adequate for Discharge  Physician Treatment Plan for Secondary Diagnosis: Principal Problem:   Schizophrenia (HCC) Active Problems:   Type 2 diabetes mellitus (HCC)  Long Term  Goal(s): Improvement in symptoms so as ready for discharge   Short Term Goals: Compliance with prescribed medications will improve Ability to maintain clinical measurements within normal limits will improve     Medication Management: Evaluate patient's response, side effects, and tolerance of medication regimen.  Therapeutic Interventions: 1 to 1 sessions, Unit Group sessions and Medication administration.  Evaluation of Outcomes: Adequate for Discharge   RN Treatment Plan for Primary Diagnosis: Schizophrenia (HCC) Long Term Goal(s): Knowledge of disease and therapeutic regimen to maintain health will improve  Short Term Goals: Ability to remain free from injury will improve, Ability to verbalize frustration and anger appropriately will improve, and Compliance with prescribed medications will improve  Medication Management: RN will administer medications as ordered by provider, will assess and evaluate patient's response and provide education to patient for prescribed medication. RN will report any adverse and/or side effects to prescribing provider.  Therapeutic Interventions: 1 on 1 counseling sessions, Psychoeducation, Medication administration, Evaluate responses to treatment, Monitor vital signs and CBGs as ordered, Perform/monitor CIWA, COWS, AIMS and Fall Risk screenings as ordered, Perform wound care treatments as ordered.  Evaluation of Outcomes: Adequate for  Discharge   LCSW Treatment Plan for Primary Diagnosis: Schizophrenia [facility]) Long Term Goal(s): Safe transition to appropriate next level of care at discharge, Engage patient in therapeutic group addressing interpersonal concerns.  Short Term Goals: Engage patient in aftercare planning with referrals and resources, Increase social support, Increase ability to appropriately verbalize feelings, and Increase skills for wellness and recovery  Therapeutic Interventions: Assess for all discharge needs, 1 to 1 time with Social worker, Explore available resources and support systems, Assess for adequacy in community support network, Educate family and significant other(s) on suicide prevention, Complete Psychosocial Assessment, Interpersonal group therapy.  Evaluation of Outcomes: Adequate for Discharge   Progress in Treatment: Attending groups: Yes. Participating in groups: Yes. Taking medication as prescribed: Yes. Toleration medication: Yes. Family/Significant other contact made: Yes, individual(s) contacted:  declined consents Patient understands diagnosis: Yes. Discussing patient identified problems/goals with staff: Yes. Medical problems stabilized or resolved: Yes. Denies suicidal/homicidal ideation: Yes. Issues/concerns per patient self-inventory: No. Other: None   New problem(s) identified: No, Describe:  None   New Short Term/Long Term Goal(s):medication stabilization, elimination of SI thoughts, development of comprehensive mental wellness plan.    Patient Goals: No goal   Discharge Plan or Barriers: Pt will return to apartment and f/u with his CST team.   Reason for Continuation of Hospitalization: Medication stabilization  Estimated Length of Stay: Adequate for discharge    Scribe for Treatment Team: Otelia Santee, LCSW 03/02/2021 11:39 AM

## 2021-03-02 NOTE — Progress Notes (Signed)
Recreation Therapy Notes  INPATIENT RECREATION TR PLAN  Patient Details Name: Mitchell [name] MRN: [id number] DOB: [birth year] Today's Date: 03/02/2021  Rec Therapy Plan Is patient appropriate for Therapeutic Recreation?: Yes Treatment times per week: about 3 days Estimated Length of Stay: 5-7 days TR Treatment/Interventions: Group participation (Comment)  Discharge Criteria Pt will be discharged from therapy if:: Discharged Treatment plan/goals/alternatives discussed and agreed upon by:: Patient/family  Discharge Summary Short term goals set: See patient Short term goals met: Adequate for discharge Progress toward goals comments: Groups attended Which groups?: Self-esteem, Coping skills, Wellness, Stress management, Communication, Other (Comment) (Personal Development) Reason goals not met: Minimal participation with prompting Therapeutic equipment acquired: N/A Reason patient discharged from therapy: Discharge from hospital Pt/family agrees with progress & goals achieved: Yes Date patient discharged from therapy: 03/02/21    Victorino Sparrow, Vickki Muff, [place] 03/02/2021, 1:20 PM

## 2021-03-02 NOTE — Progress Notes (Signed)
Pt discharged to lobby. Pt was stable and appreciative at that time. All papers, samples and prescriptions were given and valuables returned. Verbal understanding expressed. Denies SI/HI and A/VH. Pt given opportunity to express concerns and ask questions.  

## 2021-03-02 NOTE — BHH Group Notes (Signed)
Adult Orientation Group Note  Date:  03/02/2021 Time:  10:44 AM  Group Topic/Focus:  Orientation:   The focus of this group is to educate the patient on the purpose and policies of crisis stabilization and provide a format to answer questions about their admission.  The group details unit policies and expectations of patients while admitted.  Participation Level:  Active  Participation Quality:  Appropriate  Affect:  Appropriate  Cognitive:  Appropriate  Insight: Appropriate  Engagement in Group:  Engaged  Modes of Intervention:  Education  Additional Comments:  Patient didn't include a goal on inventory sheet, but has talked a lot today about discharge. He wants to see his kids/grandkids and talks a lot about music. Pt seems in good spirits. Pt checked he "sometimes and almost all of the time" has thoughts of suicide/self harm.   Mitchell [name] 03/02/2021, 10:44 AM

## 2021-03-02 NOTE — Group Note (Signed)
Recreation Therapy Group Note   Group Topic:Coping Skills  Group Date: 03/02/2021 Start Time: 0955 End Time: 1030 Facilitators: Caroll Rancher, LRT,CTRS Location: [address] Dayroom   Goal Area(s) Addresses:  Patients will be able to identify positive coping skills. Patients will identify how these coping skills can be incorporated post d/c.  Group Description:  Orthoptist.  Patients were given a drawing of a blank Orthoptist.  Patients were to identify five things or people they felt were holding them back from accomplishing their goals.  Patients were to then identify three coping skills for each setback they identified and how those coping skills would help them progress.   Affect/Mood: Appropriate   Participation Level: Minimal   Participation Quality: Independent   Behavior: Observant   Speech/Thought Process: Unfocused   Insight: Lacking   Judgement: Lacking    Modes of Intervention: Worksheet   Patient Response to Interventions:  Disengaged   Education Outcome:  Acknowledges education and In group clarification offered    Clinical Observations/Individualized Feedback: Pt listened to instructions and observed for a while before falling asleep.    Plan: Continue to engage patient in RT group sessions 2-3x/week.   Caroll Rancher, LRT,CTRS 03/02/2021 1:09 PM

## 2021-03-02 NOTE — BHH Suicide Risk Assessment (Signed)
[facility] Discharge Suicide Risk Assessment   Principal Problem: Schizophrenia [facility]) Discharge Diagnoses: Principal Problem:   Schizophrenia (HCC) Active Problems:   Type 2 diabetes mellitus (HCC)   Total Time spent with patient: 20 minutes  Patient is a 60 year old male with a past psychiatric history of schizophrenia, who was admitted to the psychiatric unit for evaluations of bizarre behavior, delusional thoughts, auditory hallucinations.  According to medical record, he is trying to get into a trash compactor, running naked outside.  There is also reports the patient having suicidal thoughts, reason for this hospital admission. Upon initial evaluation, the patient wass psychotic, delusional, disorganized, and tangential.  During the patient's hospitalization, patient had extensive initial psychiatric evaluation, and follow-up psychiatric evaluations every day.  Psychiatric diagnoses provided upon initial assessment:  Schizophrenia  Patient's psychiatric medications were adjusted on admission:  Continue Haldol 20 mg nightly for now.   Continue lithium 450 mg twice daily for now.   During the hospitalization, other adjustments were made to the patient's psychiatric medication regimen:  -Haldol dose was changed to 10 mg twice daily, and the Haldol dose was adjusted during admission up to 50 mg twice daily, then decrease back to 10 mg twice daily, after Clozaril was started and adjusted -Olanzapine was started to treat schizophrenia, but was ultimately not effective, in addition to Haldol, to treat the patient's symptoms, and olanzapine was discontinued -Loxapine was started to treat schizophrenia, but was ultimately not effective in addition to Haldol, to treat the patient's symptoms, and loxapine was discontinued. -Clozapine was started, and titrated to current dose at day of discharge which is 50 mg twice daily, and was effective, in addition to Haldol, to treat the patient's schizophrenia.   Patient continues to have fixed delusions, but other psychotic symptoms, actively responding to internal stimuli, disorganization of thought, and inability to care for self resolved with the addition of clozapine treatment.  Echocardiogram, and other baseline labs were obtained prior to starting clozapine, and repeat labs and EKGs were obtained throughout hospital admission to monitor Plaza pain and side effects.  Patient had some fatigue, during dose titration of clozapine, which improved once clozapine dose titration was paused at 50 mg bid.  -Diabetes insulin regimen was adjusted during hospitalization -Atenolol 12.5 mg once daily was started for tachycardia, after clozapine was started, and this was effective for keeping heart rate below 100 bpm -Lithium was continued at 450 mg every 12 hours  Gradually, patient started adjusting to milieu.   Patient's care was discussed during the interdisciplinary team meeting every day during the hospitalization.  The patient reported having some fatigue, during titration of clozapine, that improved after the titration of clozapine was caused at 50 mg twice daily.  The patient otherwise denies having side effects to prescribed psychiatric medication, including Malaise, Chest pain, Shortness of breath, tachycardia, Cough, Fever, Sedation, Orthostatic hypotension (dizziness with standing), hypersalivation, Constipation, Symptoms of GERD, Nausea, and Nocturnal enuresis.  There is concern for abdominal distention during hospitalization, that was worked up including right upper quadrant imaging and right upper quadrant ascites search, and other laboratory investigations.  Patient does have elevated LFTs (see below) but no other etiology of this was found.   The patient reports their target psychiatric symptoms of psychosis responded well to the psychiatric medications, and the patient reports overall benefit other psychiatric hospitalization. Supportive psychotherapy  was provided to the patient. The patient also participated in regular group therapy while admitted.   Labs were reviewed with the patient, and abnormal  results were discussed with the patient.  The patient denied having suicidal thoughts more than 48 hours prior to discharge.  Patient denies having homicidal thoughts.  Patient denies having auditory hallucinations.  Patient denies any visual hallucinations.  Patient denies having paranoid thoughts.  The patient is able to verbalize their individual safety plan to this provider.  It is recommended to the patient to continue psychiatric medications as prescribed, after discharge from the hospital.    It is recommended to the patient to follow up with your outpatient psychiatric provider and PCP.  Discussed with the patient, the impact of alcohol, drugs, tobacco have been there overall psychiatric and medical wellbeing, and total abstinence from substance use was recommended the patient.     Musculoskeletal: Strength & Muscle Tone: within normal limits Gait & Station: normal Patient leans: N/A  Psychiatric Specialty Exam  Presentation  General Appearance: Appropriate for Environment; Casual; Fairly Groomed  Eye Contact:Good  Speech:Normal Rate  Speech Volume:Normal  Handedness:Right   Mood and Affect  Mood:Euthymic  Duration of Depression Symptoms: Greater than two weeks  Affect:Appropriate; Congruent; Full Range   Thought Process  Thought Processes:Coherent; Linear  Descriptions of Associations:Intact  Orientation:Full (Time, Place and Person)  Thought Content:Logical  History of Schizophrenia/Schizoaffective disorder:Yes  Duration of Psychotic Symptoms:Greater than six months  Hallucinations:Hallucinations: None  Ideas of Reference:-- (no spontaneous delusions reported today)  Suicidal Thoughts:Suicidal Thoughts: No  Homicidal Thoughts:Homicidal Thoughts: No   Sensorium  Memory:Immediate Good; Recent  Good; Remote Good  Judgment:Good  Insight:Good   Executive Functions  Concentration:Good  Attention Span:Good  [place]  Language:Good   Psychomotor Activity  Psychomotor Activity:Psychomotor Activity: Normal   Assets  Assets:Communication Skills; Resilience   Sleep  Sleep:Sleep: Good   Physical Exam: Physical Exam See discharge summary  ROS See discharge summary  Blood pressure 124/81, pulse 97, temperature 98.5 F (36.9 C), temperature source Oral, resp. rate 18, height 5' 3.5" (1.613 m), weight 91.6 kg, SpO2 100 %. Body mass index is 35.22 kg/m.  Mental Status Per Nursing Assessment::   On Admission:  NA  Demographic factors:  Living alone, Unemployed, Low socioeconomic status Loss Factors:  Decline in physical health Historical Factors:  Impulsivity Risk Reduction Factors:  Sense of responsibility to family, Positive social support, Positive therapeutic relationship  Continued Clinical Symptoms:  Schizophrenia-some fixed delusions remain.  Patient denies having auditory hallucinations, visual hallucinations, ideas of reference, thought control or thought insertion, and paranoia.  Denies having suicidal thoughts.  Denies having homicidal thoughts.    Cognitive Features That Contribute To Risk:  None    Suicide Risk:  Minimal: No identifiable suicidal ideation.  Patients presenting with no risk factors but with morbid ruminations; may be classified as minimal risk based on the severity of the depressive symptoms   Follow-up Information     [employer]. Call.   Why: Please notify this provider that you have discharged and they will schedule you for all of your community support team services including therapy, medication management, peer support and case management services. Contact information: [address], [place] [address]  P: [place]. Go on  03/17/2021.   Why: You have a hospital follow up appointment for primary care services on 03/17/21 at 9:15 am.   This appointment will be held in person. Contact information: [address] [place] [phone]  Inc, [place] Follow up.   Why: A referral has been made on your behalf for community services to support your medical needs at home. [facility] will be reaching out to you over the phone and will schedule a home visit to determine the level of care needed. Contact information: [place] [address] [phone]                 Plan Of Care/Follow-up recommendations:   Activity: as tolerated  Diet: heart healthy  Other:  -Follow-up with your outpatient psychiatric provider - instructions on appointment date, time, and address (location) are provided to you in discharge paperwork.   -Take your psychiatric medications as prescribed at discharge - instructions are provided to you in the discharge paperwork  -Follow-up with outpatient primary care doctor and other specialists -for management of chronic medical disease, including: Diabetes and hypertension  -Testing: Follow-up with outpatient provider for normal lab results:   03/02/2021:  Troponin less than 2 CRP 1.7 CMP 133/4.4/100/27/21/1.16/204 (corrected sodium is 135), AST 33, ALT 58, alkaline phosphatase 146 CBC 7.6/13.8/41.5/243.  Absolute neutrophil count 3700  02/23/2021: Lithium level 0.70  02/11/2021 hepatitis panel: Entirely non-reactive  02/10/2021: BNP 19.6 Sed rate 7 Lipase 64 (H) Ammonia 28  -Recommend abstinence from alcohol, tobacco, and other illicit drug use at discharge.   -If your psychiatric symptoms recur, worsening, or if you have side effects to your psychiatric medications, call your outpatient psychiatric provider, 911, [phone] or go to the nearest emergency department.  -If suicidal thoughts  recur, call your outpatient psychiatric provider, 911, [phone] or go to the nearest emergency department.   Christoper Allegra, MD 03/02/2021, 10:42 AM

## 2021-03-02 NOTE — Progress Notes (Signed)
  [facility] Adult Case Management Discharge Plan :  Will you be returning to the same living situation after discharge:  Yes,  to home At discharge, do you have transportation home?: No. Safe Transport to be arranged  Do you have the ability to pay for your medications: Yes,  has insurance   Release of information consent forms completed and in the chart;  Patient's signature needed at discharge.  Patient to Follow up at:  Follow-up Information     [employer] [facility]. Call.   Why: Please notify this provider that you have discharged and they will schedule you for all of your community support team services including therapy, medication management, peer support and case management services. Contact information: [address] [place], [place] [address]  P: [phone]        [facility]. Go on 03/17/2021.   Why: You have a hospital follow up appointment for primary care services on 03/17/21 at 9:15 am.   This appointment will be held in person. Contact information: [address] [place] [address] [phone]        [facility], [facility] Follow up.   Why: A referral has been made on your behalf for community services to support your medical needs at home. [facility] will be reaching out to you over the phone and will schedule a home visit to determine the level of care needed. Contact information: [address] [place] [address] [phone]                 Next level of care provider has access to [facility] Link:no  Safety Planning and Suicide Prevention discussed: Yes,  with patient and case manager     Has patient been referred to the Quitline?: N/A patient is not a smoker  Patient has been referred for addiction treatment: N/A  Otelia Santee, LCSW 03/02/2021, 9:48 AM

## 2021-03-17 ENCOUNTER — Inpatient Hospital Stay (INDEPENDENT_AMBULATORY_CARE_PROVIDER_SITE_OTHER): Payer: Medicare Other | Admitting: Primary Care

## 2021-04-16 ENCOUNTER — Other Ambulatory Visit: Payer: Self-pay

## 2021-04-16 ENCOUNTER — Emergency Department (HOSPITAL_COMMUNITY)
Admission: EM | Admit: 2021-04-16 | Discharge: 2021-04-19 | Payer: Medicare Other | Attending: Emergency Medicine | Admitting: Emergency Medicine

## 2021-04-16 ENCOUNTER — Encounter (HOSPITAL_COMMUNITY): Payer: Self-pay

## 2021-04-16 ENCOUNTER — Ambulatory Visit (INDEPENDENT_AMBULATORY_CARE_PROVIDER_SITE_OTHER)
Admission: EM | Admit: 2021-04-16 | Discharge: 2021-04-16 | Disposition: A | Discharge status: Other Acute Inpatient Hospital | Payer: Medicare Other | Source: Home / Self Care

## 2021-04-16 DIAGNOSIS — F250 Schizoaffective disorder, bipolar type: Secondary | ICD-10-CM | POA: Insufficient documentation

## 2021-04-16 DIAGNOSIS — F29 Unspecified psychosis not due to a substance or known physiological condition: Secondary | ICD-10-CM | POA: Insufficient documentation

## 2021-04-16 DIAGNOSIS — Z8659 Personal history of other mental and behavioral disorders: Secondary | ICD-10-CM

## 2021-04-16 DIAGNOSIS — Z046 Encounter for general psychiatric examination, requested by authority: Secondary | ICD-10-CM | POA: Diagnosis present

## 2021-04-16 DIAGNOSIS — E1165 Type 2 diabetes mellitus with hyperglycemia: Secondary | ICD-10-CM | POA: Insufficient documentation

## 2021-04-16 DIAGNOSIS — Z794 Long term (current) use of insulin: Secondary | ICD-10-CM | POA: Insufficient documentation

## 2021-04-16 DIAGNOSIS — Z7982 Long term (current) use of aspirin: Secondary | ICD-10-CM | POA: Insufficient documentation

## 2021-04-16 DIAGNOSIS — Z7984 Long term (current) use of oral hypoglycemic drugs: Secondary | ICD-10-CM | POA: Diagnosis not present

## 2021-04-16 DIAGNOSIS — I10 Essential (primary) hypertension: Secondary | ICD-10-CM | POA: Diagnosis not present

## 2021-04-16 DIAGNOSIS — F201 Disorganized schizophrenia: Secondary | ICD-10-CM

## 2021-04-16 DIAGNOSIS — R739 Hyperglycemia, unspecified: Secondary | ICD-10-CM

## 2021-04-16 DIAGNOSIS — Z20822 Contact with and (suspected) exposure to covid-19: Secondary | ICD-10-CM | POA: Insufficient documentation

## 2021-04-16 DIAGNOSIS — Z9114 Patient's other noncompliance with medication regimen: Secondary | ICD-10-CM | POA: Insufficient documentation

## 2021-04-16 DIAGNOSIS — Z79899 Other long term (current) drug therapy: Secondary | ICD-10-CM | POA: Insufficient documentation

## 2021-04-16 DIAGNOSIS — F200 Paranoid schizophrenia: Secondary | ICD-10-CM

## 2021-04-16 DIAGNOSIS — E119 Type 2 diabetes mellitus without complications: Secondary | ICD-10-CM

## 2021-04-16 DIAGNOSIS — F23 Brief psychotic disorder: Secondary | ICD-10-CM

## 2021-04-16 LAB — CBC WITH DIFFERENTIAL/PLATELET
Abs Immature Granulocytes: 0.01 10*3/uL (ref 0.00–0.07)
Basophils Absolute: 0 10*3/uL (ref 0.0–0.1)
Basophils Relative: 1 %
Eosinophils Absolute: 0.2 10*3/uL (ref 0.0–0.5)
Eosinophils Relative: 3 %
HCT: 37.1 % — ABNORMAL LOW (ref 39.0–52.0)
Hemoglobin: 12.8 g/dL — ABNORMAL LOW (ref 13.0–17.0)
Immature Granulocytes: 0 %
Lymphocytes Relative: 43 %
Lymphs Abs: 2.5 10*3/uL (ref 0.7–4.0)
MCH: 31.4 pg (ref 26.0–34.0)
MCHC: 34.5 g/dL (ref 30.0–36.0)
MCV: 91.2 fL (ref 80.0–100.0)
Monocytes Absolute: 0.5 10*3/uL (ref 0.1–1.0)
Monocytes Relative: 8 %
Neutro Abs: 2.6 10*3/uL (ref 1.7–7.7)
Neutrophils Relative %: 45 %
Platelets: 213 10*3/uL (ref 150–400)
RBC: 4.07 MIL/uL — ABNORMAL LOW (ref 4.22–5.81)
RDW: 12.8 % (ref 11.5–15.5)
WBC: 5.8 10*3/uL (ref 4.0–10.5)
nRBC: 0 % (ref 0.0–0.2)

## 2021-04-16 LAB — CBC
HCT: 40 % (ref 39.0–52.0)
Hemoglobin: 13.3 g/dL (ref 13.0–17.0)
MCH: 30.9 pg (ref 26.0–34.0)
MCHC: 33.3 g/dL (ref 30.0–36.0)
MCV: 92.8 fL (ref 80.0–100.0)
Platelets: 232 10*3/uL (ref 150–400)
RBC: 4.31 MIL/uL (ref 4.22–5.81)
RDW: 12.7 % (ref 11.5–15.5)
WBC: 6 10*3/uL (ref 4.0–10.5)
nRBC: 0 % (ref 0.0–0.2)

## 2021-04-16 LAB — POCT URINE DRUG SCREEN - MANUAL ENTRY (I-SCREEN)
POC Amphetamine UR: NOT DETECTED
POC Buprenorphine (BUP): NOT DETECTED
POC Cocaine UR: NOT DETECTED
POC Marijuana UR: NOT DETECTED
POC Methadone UR: NOT DETECTED
POC Methamphetamine UR: NOT DETECTED
POC Morphine: NOT DETECTED
POC Oxazepam (BZO): NOT DETECTED
POC Oxycodone UR: NOT DETECTED
POC Secobarbital (BAR): NOT DETECTED

## 2021-04-16 LAB — RAPID URINE DRUG SCREEN, HOSP PERFORMED
Amphetamines: NOT DETECTED
Barbiturates: NOT DETECTED
Benzodiazepines: NOT DETECTED
Cocaine: NOT DETECTED
Opiates: NOT DETECTED
Tetrahydrocannabinol: NOT DETECTED

## 2021-04-16 LAB — COMPREHENSIVE METABOLIC PANEL
ALT: 21 U/L (ref 0–44)
AST: 28 U/L (ref 15–41)
Albumin: 4 g/dL (ref 3.5–5.0)
Alkaline Phosphatase: 110 U/L (ref 38–126)
Anion gap: 11 (ref 5–15)
BUN: 19 mg/dL (ref 6–20)
CO2: 22 mmol/L (ref 22–32)
Calcium: 9 mg/dL (ref 8.9–10.3)
Chloride: 95 mmol/L — ABNORMAL LOW (ref 98–111)
Creatinine, Ser: 1.61 mg/dL — ABNORMAL HIGH (ref 0.61–1.24)
GFR, Estimated: 49 mL/min — ABNORMAL LOW (ref >60)
Glucose, Bld: 492 mg/dL — ABNORMAL HIGH (ref 70–99)
Potassium: 5 mmol/L (ref 3.5–5.1)
Sodium: 128 mmol/L — ABNORMAL LOW (ref 135–145)
Total Bilirubin: 0.9 mg/dL (ref 0.3–1.2)
Total Protein: 7.3 g/dL (ref 6.5–8.1)

## 2021-04-16 LAB — LITHIUM LEVEL: Lithium Lvl: 0.32 mmol/L — ABNORMAL LOW (ref 0.60–1.20)

## 2021-04-16 LAB — CBG MONITORING, ED
Glucose-Capillary: 270 mg/dL — ABNORMAL HIGH (ref 70–99)
Glucose-Capillary: 373 mg/dL — ABNORMAL HIGH (ref 70–99)
Glucose-Capillary: 451 mg/dL — ABNORMAL HIGH (ref 70–99)
Glucose-Capillary: 486 mg/dL — ABNORMAL HIGH (ref 70–99)

## 2021-04-16 LAB — RESP PANEL BY RT-PCR (FLU A&B, COVID) ARPGX2
Influenza A by PCR: NEGATIVE
Influenza A by PCR: NEGATIVE
Influenza B by PCR: NEGATIVE
Influenza B by PCR: NEGATIVE
SARS Coronavirus 2 by RT PCR: NEGATIVE
SARS Coronavirus 2 by RT PCR: NEGATIVE

## 2021-04-16 LAB — POC SARS CORONAVIRUS 2 AG -  ED: SARS Coronavirus 2 Ag: NEGATIVE

## 2021-04-16 LAB — ETHANOL: Alcohol, Ethyl (B): <10 mg/dL (ref <10)

## 2021-04-16 MED ORDER — GLIPIZIDE ER 10 MG PO TB24
10.0000 mg | ORAL_TABLET | Freq: Every day | ORAL | Status: DC
  Administered 2021-04-17 – 2021-04-19 (×3): 10 mg via ORAL
  Filled 2021-04-16 (×5): qty 1

## 2021-04-16 MED ORDER — INSULIN ASPART 100 UNIT/ML IJ SOLN
14.0000 [IU] | Freq: Once | INTRAMUSCULAR | Status: AC
Start: 2021-04-16 — End: 2021-04-16
  Administered 2021-04-16: 14 [IU] via SUBCUTANEOUS

## 2021-04-16 MED ORDER — LISINOPRIL 20 MG PO TABS
40.0000 mg | ORAL_TABLET | Freq: Every day | ORAL | Status: DC
  Administered 2021-04-17 – 2021-04-19 (×3): 40 mg via ORAL
  Filled 2021-04-16 (×3): qty 2

## 2021-04-16 MED ORDER — BENZTROPINE MESYLATE 1 MG PO TABS
1.0000 mg | ORAL_TABLET | Freq: Two times a day (BID) | ORAL | Status: DC
  Administered 2021-04-16 – 2021-04-19 (×7): 1 mg via ORAL
  Filled 2021-04-16 (×7): qty 1

## 2021-04-16 MED ORDER — AMLODIPINE BESYLATE 10 MG PO TABS: 10.0000 mg | ORAL_TABLET | Freq: Every day | ORAL | Status: DC

## 2021-04-16 MED ORDER — AMLODIPINE BESYLATE 5 MG PO TABS
10.0000 mg | ORAL_TABLET | Freq: Every day | ORAL | Status: DC
  Administered 2021-04-17 – 2021-04-19 (×3): 10 mg via ORAL
  Filled 2021-04-16 (×3): qty 2

## 2021-04-16 MED ORDER — LISINOPRIL 20 MG PO TABS: 40.0000 mg | ORAL_TABLET | Freq: Every day | ORAL | Status: DC

## 2021-04-16 MED ORDER — ACETAMINOPHEN 325 MG PO TABS: 650.0000 mg | ORAL_TABLET | Freq: Four times a day (QID) | ORAL | Status: DC | PRN

## 2021-04-16 MED ORDER — LACTATED RINGERS IV BOLUS
1000.0000 mL | Freq: Once | INTRAVENOUS | Status: AC
  Administered 2021-04-16: 1000 mL via INTRAVENOUS

## 2021-04-16 MED ORDER — ATENOLOL 25 MG PO TABS: 12.5000 mg | ORAL_TABLET | Freq: Every day | ORAL | Status: DC

## 2021-04-16 MED ORDER — CLONIDINE HCL 0.2 MG PO TABS
0.2000 mg | ORAL_TABLET | Freq: Two times a day (BID) | ORAL | Status: DC
  Administered 2021-04-16 – 2021-04-19 (×7): 0.2 mg via ORAL
  Filled 2021-04-16 (×7): qty 1

## 2021-04-16 MED ORDER — LITHIUM CARBONATE ER 450 MG PO TBCR: 450.0000 mg | EXTENDED_RELEASE_TABLET | Freq: Two times a day (BID) | ORAL | Status: DC

## 2021-04-16 MED ORDER — INSULIN ASPART PROT & ASPART (70-30 MIX) 100 UNIT/ML PEN: 48.0000 [IU] | PEN_INJECTOR | Freq: Every day | SUBCUTANEOUS | Status: DC

## 2021-04-16 MED ORDER — HALOPERIDOL 5 MG PO TABS
20.0000 mg | ORAL_TABLET | Freq: Every day | ORAL | Status: DC
  Administered 2021-04-16 – 2021-04-19 (×4): 20 mg via ORAL
  Filled 2021-04-16 (×4): qty 4

## 2021-04-16 MED ORDER — TRAZODONE HCL 50 MG PO TABS: 50.0000 mg | ORAL_TABLET | Freq: Every day | ORAL | Status: DC

## 2021-04-16 MED ORDER — ALUM & MAG HYDROXIDE-SIMETH 200-200-20 MG/5ML PO SUSP: 30.0000 mL | ORAL | Status: DC | PRN

## 2021-04-16 MED ORDER — HALOPERIDOL 5 MG PO TABS: 20.0000 mg | ORAL_TABLET | Freq: Every day | ORAL | Status: DC

## 2021-04-16 MED ORDER — BENZTROPINE MESYLATE 1 MG PO TABS: 1.0000 mg | ORAL_TABLET | Freq: Two times a day (BID) | ORAL | Status: DC

## 2021-04-16 MED ORDER — GLIPIZIDE ER 2.5 MG PO TB24: 20.0000 mg | ORAL_TABLET | Freq: Every day | ORAL | Status: DC

## 2021-04-16 MED ORDER — ASPIRIN EC 81 MG PO TBEC: 81.0000 mg | DELAYED_RELEASE_TABLET | Freq: Every day | ORAL | Status: DC

## 2021-04-16 MED ORDER — CLONIDINE HCL 0.1 MG PO TABS: 0.2000 mg | ORAL_TABLET | Freq: Two times a day (BID) | ORAL | Status: DC

## 2021-04-16 MED ORDER — TRAZODONE HCL 50 MG PO TABS: 50.0000 mg | ORAL_TABLET | Freq: Every evening | ORAL | Status: DC | PRN

## 2021-04-16 MED ORDER — MAGNESIUM HYDROXIDE 400 MG/5ML PO SUSP: 30.0000 mL | Freq: Every day | ORAL | Status: DC | PRN

## 2021-04-16 MED ORDER — TRAZODONE HCL 50 MG PO TABS
50.0000 mg | ORAL_TABLET | Freq: Every day | ORAL | Status: DC
  Administered 2021-04-16 – 2021-04-19 (×4): 50 mg via ORAL
  Filled 2021-04-16 (×4): qty 1

## 2021-04-16 MED ORDER — HYDROXYZINE HCL 25 MG PO TABS: 25.0000 mg | ORAL_TABLET | Freq: Three times a day (TID) | ORAL | Status: DC | PRN

## 2021-04-16 MED ORDER — INSULIN ASPART PROT & ASPART (70-30 MIX) 100 UNIT/ML ~~LOC~~ SUSP: 48.0000 [IU] | Freq: Every day | SUBCUTANEOUS | Status: DC

## 2021-04-16 MED ORDER — CLOZAPINE 25 MG PO TABS
50.0000 mg | ORAL_TABLET | Freq: Two times a day (BID) | ORAL | Status: DC
  Administered 2021-04-17 – 2021-04-19 (×7): 50 mg via ORAL
  Filled 2021-04-16 (×8): qty 2

## 2021-04-16 MED ORDER — INSULIN ASPART PROT & ASPART (70-30 MIX) 100 UNIT/ML ~~LOC~~ SUSP
42.0000 [IU] | Freq: Every day | SUBCUTANEOUS | Status: DC
  Filled 2021-04-16: qty 10

## 2021-04-16 MED ORDER — INSULIN ASPART PROT & ASPART (70-30 MIX) 100 UNIT/ML PEN
42.0000 [IU] | PEN_INJECTOR | Freq: Every day | SUBCUTANEOUS | Status: DC
  Filled 2021-04-16: qty 3

## 2021-04-16 MED ORDER — LITHIUM CARBONATE ER 450 MG PO TBCR
450.0000 mg | EXTENDED_RELEASE_TABLET | Freq: Two times a day (BID) | ORAL | Status: DC
  Administered 2021-04-16 – 2021-04-19 (×7): 450 mg via ORAL
  Filled 2021-04-16 (×7): qty 1

## 2021-04-16 MED ORDER — ATENOLOL 25 MG PO TABS
12.5000 mg | ORAL_TABLET | Freq: Every day | ORAL | Status: DC
  Administered 2021-04-17 – 2021-04-19 (×3): 12.5 mg via ORAL
  Filled 2021-04-16 (×3): qty 1

## 2021-04-16 NOTE — ED Notes (Signed)
Pt in purple scrubs and wanded by security.

## 2021-04-16 NOTE — Progress Notes (Signed)
RN called [facility] charge RN and inform Marisue Ivan, patient coming to [facility] for medical clearance.

## 2021-04-16 NOTE — ED Provider Notes (Signed)
[facility] EMERGENCY DEPARTMENT Provider Note   CSN: [id number] Arrival date & time: 04/16/21  1848     History  Chief Complaint  Patient presents with   IVC   Medical Clearance    Mitchell [name] is a 61 y.o. male.  Patient with hx schizophrenia from [facility] for medical clearance. Patient presented under ivc with acute psychosis. [facility] team there evaluated, did first exam, and indicated sending to ED for med eval and then patient is to be sent back there for further [facility] care. Patient denies any physical c/o. Indicates had not been  taking meds regularly as outpatient. Denies headache. No neck or back pain. Denies chest pain or sob. No abd pain or nvd. No dysuria or gu c/o. No extremity pain or swelling. No skin lesions/ulcers or rash. Denies cough, sore throat, or uri symptoms. No fever/chills/sweats.   The history is provided by the patient, medical records, a caregiver and the police.      Home Medications Prior to Admission medications   Medication Sig Start Date End Date Taking? Authorizing Provider  amLODipine (NORVASC) 10 MG tablet Take 1 tablet (10 mg total) by mouth daily. Patient not taking: Reported on 01/26/2021 01/11/19   Johnn Hai, MD  aspirin 81 MG EC tablet Take 1 tablet (81 mg total) by mouth daily. Swallow whole. 01/11/19   Johnn Hai, MD  atenolol (TENORMIN) 25 MG tablet Take 0.5 tablets (12.5 mg total) by mouth daily. 03/02/21 04/01/21  France Ravens, MD  benztropine (COGENTIN) 1 MG tablet Take 1 tablet (1 mg total) by mouth 2 (two) times daily. Patient not taking: Reported on 01/26/2021 01/10/19   Johnn Hai, MD  cloNIDine (CATAPRES) 0.2 MG tablet Take 0.2 mg by mouth 2 (two) times daily. Patient not taking: Reported on 01/26/2021    [provider]  cloZAPine (CLOZARIL) 50 MG tablet Take 1 tablet (50 mg total) by mouth 2 (two) times daily. 03/02/21 04/01/21  France Ravens, MD  glipiZIDE (GLUCOTROL XL) 10 MG 24 hr tablet Take 2 tablets (20 mg  total) by mouth daily with breakfast. 03/02/21 04/01/21  France Ravens, MD  haloperidol (HALDOL) 20 MG tablet Take 1 tablet (20 mg total) by mouth at bedtime. Patient not taking: Reported on 01/26/2021 01/10/19   Johnn Hai, MD  insulin aspart protamine - aspart (NOVOLOG MIX 70/30 FLEXPEN) (70-30) 100 UNIT/ML FlexPen Inject 48 Units into the skin daily with breakfast. 03/02/21 04/02/21  France Ravens, MD  insulin aspart protamine - aspart (NOVOLOG MIX 70/30 FLEXPEN) (70-30) 100 UNIT/ML FlexPen Inject 42 Units into the skin daily with supper. 03/02/21   France Ravens, MD  lisinopril (ZESTRIL) 40 MG tablet Take 1 tablet (40 mg total) by mouth daily. Patient not taking: No sig reported 01/11/19   Johnn Hai, MD  lithium carbonate (ESKALITH) 450 MG CR tablet Take 1 tablet (450 mg total) by mouth every 12 (twelve) hours. Patient not taking: Reported on 01/26/2021 01/10/19   Johnn Hai, MD  traZODone (DESYREL) 50 MG tablet Take 1 tablet (50 mg total) by mouth at bedtime. 03/02/21 04/01/21  France Ravens, MD      Allergies    Patient has no known allergies.    Review of Systems   Review of Systems  Constitutional:  Negative for chills and fever.  HENT:  Negative for sore throat.   Eyes:  Negative for redness.  Respiratory:  Negative for cough and shortness of breath.   Cardiovascular:  Negative for chest pain.  Gastrointestinal:  Negative for abdominal pain, diarrhea and vomiting.  Genitourinary:  Negative for dysuria and flank pain.  Musculoskeletal:  Negative for back pain, neck pain and neck stiffness.  Skin:  Negative for rash.  Neurological:  Negative for headaches.  Hematological:  Does not bruise/bleed easily.  Psychiatric/Behavioral:  Negative for suicidal ideas.    Physical Exam Updated Vital Signs There were no vitals taken for this visit. Physical Exam Vitals and nursing note reviewed.  Constitutional:      Appearance: Normal appearance. He is well-developed.  HENT:     Head: Atraumatic.      Nose: Nose normal.     Mouth/Throat:     Mouth: Mucous membranes are moist.     Pharynx: Oropharynx is clear.  Eyes:     General: No scleral icterus.    Conjunctiva/sclera: Conjunctivae normal.     Pupils: Pupils are equal, round, and reactive to light.  Neck:     Trachea: No tracheal deviation.     Comments: No stiffness or rigidity. Thyroid not grossly enlarged or tender.  Cardiovascular:     Rate and Rhythm: Normal rate and regular rhythm.     Pulses: Normal pulses.     Heart sounds: Normal heart sounds. No murmur heard.   No friction rub. No gallop.  Pulmonary:     Effort: Pulmonary effort is normal. No accessory muscle usage or respiratory distress.     Breath sounds: Normal breath sounds.  Abdominal:     General: Bowel sounds are normal. There is no distension.     Palpations: Abdomen is soft.     Tenderness: There is no abdominal tenderness. There is no guarding.  Genitourinary:    Comments: No cva tenderness. Normal external gu exam.  Musculoskeletal:        General: No swelling or tenderness.     Cervical back: Normal range of motion and neck supple. No rigidity.  Skin:    General: Skin is warm and dry.     Findings: No rash.     Comments: No rash or skin ulcers/lesions noted.   Neurological:     Mental Status: He is alert.     Comments: Alert, speech clear. Motor/sens grossly intact bil. No rigidity. No tremor or shakes.   Psychiatric:        Mood and Affect: Mood normal.    ED Results / Procedures / Treatments   Labs (all labs ordered are listed, but only abnormal results are displayed) Results for orders placed or performed during the hospital encounter of 04/16/21  Comprehensive metabolic panel  Result Value Ref Range   Sodium 128 (L) 135 - 145 mmol/L   Potassium 5.0 3.5 - 5.1 mmol/L   Chloride 95 (L) 98 - 111 mmol/L   CO2 22 22 - 32 mmol/L   Glucose, Bld 492 (H) 70 - 99 mg/dL   BUN 19 6 - 20 mg/dL   Creatinine, Ser 1.61 (H) 0.61 - 1.24 mg/dL    Calcium 9.0 8.9 - 10.3 mg/dL   Total Protein 7.3 6.5 - 8.1 g/dL   Albumin 4.0 3.5 - 5.0 g/dL   AST 28 15 - 41 U/L   ALT 21 0 - 44 U/L   Alkaline Phosphatase 110 38 - 126 U/L   Total Bilirubin 0.9 0.3 - 1.2 mg/dL   GFR, Estimated 49 (L) >60 mL/min   Anion gap 11 5 - 15  Ethanol  Result Value Ref Range   Alcohol, Ethyl (B) <10 <10  mg/dL  cbc  Result Value Ref Range   WBC 6.0 4.0 - 10.5 K/uL   RBC 4.31 4.22 - 5.81 MIL/uL   Hemoglobin 13.3 13.0 - 17.0 g/dL   HCT 40.0 39.0 - 52.0 %   MCV 92.8 80.0 - 100.0 fL   MCH 30.9 26.0 - 34.0 pg   MCHC 33.3 30.0 - 36.0 g/dL   RDW 12.7 11.5 - 15.5 %   Platelets 232 150 - 400 K/uL   nRBC 0.0 0.0 - 0.2 %  Rapid urine drug screen (hospital performed)  Result Value Ref Range   Opiates NONE DETECTED NONE DETECTED   Cocaine NONE DETECTED NONE DETECTED   Benzodiazepines NONE DETECTED NONE DETECTED   Amphetamines NONE DETECTED NONE DETECTED   Tetrahydrocannabinol NONE DETECTED NONE DETECTED   Barbiturates NONE DETECTED NONE DETECTED  Lithium level  Result Value Ref Range   Lithium Lvl 0.32 (L) 0.60 - 1.20 mmol/L  CBG monitoring, ED  Result Value Ref Range   Glucose-Capillary 486 (H) 70 - 99 mg/dL  POC CBG, ED  Result Value Ref Range   Glucose-Capillary 451 (H) 70 - 99 mg/dL     ED ECG REPORT   Date: 04/16/2021  Rate: 98  Rhythm: normal sinus rhythm  QRS Axis: normal  Intervals: normal  ST/T Wave abnormalities: nonspecific ST changes  Conduction Disutrbances:none  Narrative Interpretation:   Old EKG Reviewed: unchanged  I have personally reviewed the EKG tracing    Radiology No results found.  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 Labs: ordered. Decision-making details documented in ED Course. Discussion of management or test interpretation with external provider(s): Discussed w [facility] provider, including care/treatment plan, to return to [facility] once medical cleared in ED.   Risk Prescription drug management. Decision regarding hospitalization. Diagnosis or treatment significantly limited by social determinants of health.   Labs sent. Continuous pulse ox and cardiac monitoring. Iv ns.   Cardiac monitor: sinus rhythm, rate 96.  Considered potential need for admission, incl [facility] admission, based on symptoms, labs/workup pending.   Reviewed nursing notes and prior charts for additional history. External reports reviewed, including BH assessment.  Additional hx from [facility] provider, and law enforcement.   Pt currently calm, alert, and cooperative.   Labs  reviewed/interpreted by me - glucose high, 492, hco3 normal, gap not increased. Iv LR bolus. Novolog SQ.   Po fluids/food - pt eating/drinking.   Pt with period agitated behavior. Considered parenteral med use/geodon im - however, with calming/reassurance patients behavior  improved. Calm/content on recheck.   Pharmacy consulted - verified pts home meds/doses -  ordered pts meds.   Patient is pending [facility] placement. Tentatively plan transition back to [facility] when  patients blood sugars improved.   Dispo per [facility] team.           Final Clinical Impression(s) / ED Diagnoses Final diagnoses:  None    Rx / DC Orders ED Discharge Orders     None         Lajean Saver, MD 04/16/21 2238

## 2021-04-16 NOTE — ED Notes (Signed)
NT inventoried pts belongings. NT had pt sign inventory paperwork. Pts belongings placed in locker #11.

## 2021-04-16 NOTE — ED Notes (Signed)
Diet coke at bedside per pt's request.

## 2021-04-16 NOTE — Progress Notes (Signed)
Mitchell [name] was transported via EMS with [employer] to [facility] ED without incident. He received his personal belongings and the required paperwork.
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
## 2021-04-16 NOTE — Progress Notes (Signed)
[facility] [facility] called for transport of patient, due to patient being IVC'd.
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
## 2021-04-16 NOTE — ED Provider Notes (Signed)
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 10/17/2020 130 (L)  135 - 145 mmol/L Final   Potassium 10/17/2020 3.5  3.5 - 5.1 mmol/L Final   Chloride 10/17/2020 98  98 - 111 mmol/L Final   CO2 10/17/2020 22  22 - 32 mmol/L Final   Glucose, Bld 10/17/2020 193 (H)  70 - 99 mg/dL Final   Glucose reference range applies only to samples taken after fasting for at least 8 hours.   BUN 10/17/2020 12  6 - 20 mg/dL Final   Creatinine, Ser 10/17/2020 1.18  0.61 - 1.24 mg/dL Final   Calcium 10/17/2020 9.2  8.9 - 10.3 mg/dL Final   Total Protein 10/17/2020  7.8  6.5 - 8.1 g/dL Final   Albumin 10/17/2020 4.1  3.5 - 5.0 g/dL Final   AST 10/17/2020  25  15 - 41 U/L Final   ALT 10/17/2020 30  0 - 44 U/L Final   Alkaline Phosphatase 10/17/2020 107  38 - 126 U/L Final   Total Bilirubin 10/17/2020 0.5  0.3 - 1.2 mg/dL Final   GFR, Estimated 10/17/2020 >60  >60 mL/min Final   Comment: (NOTE) Calculated using the CKD-EPI Creatinine Equation (2021)    Anion gap 10/17/2020 10  5 - 15 Final   Performed at [facility], [place] [address] [place], [place] [address]   Lipase 10/17/2020 55 (H)  11 - 51 U/L Final   Performed at [place] [address] [place], [place] [address]   Color, Urine 10/17/2020 STRAW (A)  YELLOW Final   APPearance 10/17/2020 CLEAR  CLEAR Final   Specific Gravity, Urine 10/17/2020 1.004 (L)  1.005 - 1.030 Final   pH 10/17/2020 6.0  5.0 - 8.0 Final   Glucose, UA 10/17/2020 50 (A)  NEGATIVE mg/dL Final   Hgb urine dipstick 10/17/2020 SMALL (A)  NEGATIVE Final   Bilirubin Urine 10/17/2020 NEGATIVE  NEGATIVE Final   Ketones, ur 10/17/2020 5 (A)  NEGATIVE mg/dL Final   Protein, ur 10/17/2020 NEGATIVE  NEGATIVE mg/dL Final   Nitrite 10/17/2020 NEGATIVE  NEGATIVE Final   Leukocytes,Ua 10/17/2020 NEGATIVE  NEGATIVE Final   WBC, UA 10/17/2020 0-5  0 - 5 WBC/hpf Final   Bacteria, UA 10/17/2020 NONE SEEN  NONE SEEN Final   Performed at [facility], [place] [address], [place], [place] [address]   Glucose-Capillary 10/17/2020 182 (H)  70 - 99 mg/dL Final   Glucose reference range applies only to samples taken after fasting for at least 8 hours.   Lithium Lvl 10/17/2020 0.47 (L)  0.60 - 1.20 mmol/L Final   Performed at [facility], [place] [address], [place], [place] [address]   Opiates 10/17/2020 NONE DETECTED  NONE DETECTED Final   Cocaine 10/17/2020 NONE DETECTED  NONE DETECTED Final   Benzodiazepines 10/17/2020 NONE DETECTED  NONE DETECTED Final   Amphetamines 10/17/2020 NONE DETECTED  NONE DETECTED Final    Tetrahydrocannabinol 10/17/2020 NONE DETECTED  NONE DETECTED Final   Barbiturates 10/17/2020 NONE DETECTED  NONE DETECTED Final   Comment: (NOTE) DRUG SCREEN FOR MEDICAL PURPOSES ONLY.  IF CONFIRMATION IS NEEDED FOR ANY PURPOSE, NOTIFY LAB WITHIN 5 DAYS.  LOWEST DETECTABLE LIMITS FOR URINE DRUG SCREEN Drug Class                     Cutoff (ng/mL) Amphetamine and metabolites    1000 Barbiturate and metabolites    200 Benzodiazepine                 [id number] Tricyclics and metabolites     300 Opiates and metabolites        300 Cocaine and metabolites        300 THC                            50 Performed at [facility], [place] [address], [place], [place] [address]     Allergies: Patient has no known allergies.  PTA Medications: (Not in a hospital admission)   Medical Decision Making  Patient reviewed with Dr. Hampton Abbot.   Patient has been off of medications for an undetermined amount of time.  Will restart clozapine starting dose of 25 mg daily.  We will update haloperidol  from 20 mg nightly to 10 mg twice daily.    He remains under involuntary commitment petition.  Inpatient psychiatric treatment recommended.  Laboratory studies ordered including CBC, CMP, ethanol, A1c, hepatic function, lipid panel, magnesium, prolactin and TSH.  Urine drug screen and UA orders initiated.  Lithium level ordered.  EKG ordered.  Current medications: -Acetaminophen 650 mg every 6 as needed/mild pain -Maalox 30 mL oral every 4 as needed/digestion -Magnesium hydroxide 30 mL daily as needed/mild constipation  Restarted home medications including: -Amlodipine 10 mg daily -Aspirin EC 81 mg daily -Atenolol 12.5 mg daily -Benztropine 1 mg twice daily -Clonidine 0.2 mg twice daily -Glipizide 24-hour tablet 20 mg daily with breakfast -Insulin aspart protamine (NOVOLOG 70/30 MIX) 42 units subcutaneous daily with supper -Insulin aspart protamine (NOVOLOG 70/30 MIX) 48 units subcutaneous daily  with breakfast -Lisinopril 40 mg daily -Lithium carbonate CR 450 mg every 12 hours -Trazodone 50 mg nightly   Updated home medications including: -Haldol 10 mg twice daily -Clozapine 25 mg daily  Additional PRN medication: -Ativan 2 mg every 6 hours as needed/anxiety or agitation    Recommendations  Based on my evaluation the patient appears to have an emergency medical condition for which I recommend the patient be transferred to the emergency department for further evaluation. Patient will be transported to [facility] emergency department for medical clearance, accepted by Dr. Ashok Cordia.  He can return to [facility] behavioral health once medically clear.  Lucky Rathke, FNP 04/16/21  4:58 PM

## 2021-04-16 NOTE — ED Notes (Signed)
Hot dinner was given

## 2021-04-17 DIAGNOSIS — F250 Schizoaffective disorder, bipolar type: Secondary | ICD-10-CM | POA: Diagnosis not present

## 2021-04-17 LAB — CBG MONITORING, ED
Glucose-Capillary: 410 mg/dL — ABNORMAL HIGH (ref 70–99)
Glucose-Capillary: 381 mg/dL — ABNORMAL HIGH (ref 70–99)
Glucose-Capillary: 392 mg/dL — ABNORMAL HIGH (ref 70–99)
Glucose-Capillary: 211 mg/dL — ABNORMAL HIGH (ref 70–99)

## 2021-04-17 MED ORDER — INSULIN ASPART 100 UNIT/ML IJ SOLN
0.0000 [IU] | INTRAMUSCULAR | Status: DC
  Administered 2021-04-17: 15 [IU] via SUBCUTANEOUS
  Administered 2021-04-17: 5 [IU] via SUBCUTANEOUS
  Administered 2021-04-18: 2 [IU] via SUBCUTANEOUS
  Administered 2021-04-19: 5 [IU] via SUBCUTANEOUS

## 2021-04-17 MED ORDER — INSULIN ASPART PROT & ASPART (70-30 MIX) 100 UNIT/ML ~~LOC~~ SUSP
42.0000 [IU] | Freq: Every day | SUBCUTANEOUS | Status: DC
  Administered 2021-04-18: 42 [IU] via SUBCUTANEOUS
  Filled 2021-04-17: qty 10

## 2021-04-17 MED ORDER — INSULIN ASPART PROT & ASPART (70-30 MIX) 100 UNIT/ML ~~LOC~~ SUSP
42.0000 [IU] | Freq: Every day | SUBCUTANEOUS | Status: DC
  Administered 2021-04-17 – 2021-04-19 (×3): 42 [IU] via SUBCUTANEOUS
  Filled 2021-04-17: qty 10

## 2021-04-17 NOTE — ED Provider Notes (Addendum)
I assumed care of the patient at 0 800.  Briefly the patient was sent from the ER for medical clearance.  Found to be hyperglycemic.  This has improved overnight.  We will discharge back to [facility].   In the time to get the patient transferred over to [facility] his blood sugar had gone up slightly.  I was notified by the [facility] provider that the patient is unable to be excepted if he has had a blood sugar greater than 358 all within the last 24 hours.  I discussed this with the pharmacy to verify that he does have insulin ordered but is not to resume until this evening.  Order dose for now.  Started on sliding scale.     Deno Etienne, DO 04/17/21 1330

## 2021-04-17 NOTE — ED Notes (Signed)
Pt's pulse ox sats 88% RA while sleeping, this RN placed pt on 2L [place], now sats 93%. Pt has hx of sleep apnea.

## 2021-04-17 NOTE — Progress Notes (Signed)
Patient denied by [facility] due to no appropriate beds available. Patient meets BH inpatient criteria per Doran Heater, NP. Patient has been faxed out to the following facilities:    [facility]  [address], [place] [address] [phone]  [facility]  [address] [place] [address] [phone]  [facility]  [address], [place] [address] [phone]  [facility]  [address] [place] [address] [phone]  [facility]  [address] [place], [place] [address] [phone]  [facility]  [address] [place], [place] [address] [phone]  [facility]  [address] [place] [address] [phone]  [facility]  [address] [place] [address] [phone]  [facility]  [address] [place] [address] [phone]  [facility]  [address] [place] [address] [phone]  [facility]  [address] [place] [address] [phone]  [facility]  [address] [place] [address] [phone]  [facility]  [address] [place], [place] [address] [phone]  [facility]  [address] [place] [address] [phone]  [facility]  [address], [place] [address] [phone]  [facility]  [address], [place] [address] [phone]  [facility]  [address] [place] [address] [phone]  [facility]  [address], [place] [address] [phone]  [facility]  [address] [place] [address] [phone]   Damita Dunnings, MSW, LCSW-A  12:48 PM 04/17/2021

## 2021-04-17 NOTE — Progress Notes (Signed)
Inpatient Diabetes Program Recommendations  AACE/ADA: New Consensus Statement on Inpatient Glycemic Control (2015)  Target Ranges:  Prepandial:   less than 140 mg/dL      Peak postprandial:   less than 180 mg/dL (1-2 hours)      Critically ill patients:  140 - 180 mg/dL   Lab Results  Component Value Date   GLUCAP 381 (H) 04/17/2021   HGBA1C 11.1 (H) 01/26/2021    Review of Glycemic Control  Latest Reference Range & Units 04/16/21 23:01 04/16/21 23:51 04/17/21 09:53 04/17/21 11:45  Glucose-Capillary 70 - 99 mg/dL 283 (H) 662 (H) 947 (H) 381 (H)  (H): Data is abnormally high  Diabetes history: DM2 Outpatient Diabetes medications: 70/30 48 units with breakfast and 70/30 42 units with supper, glipizide 20 mg with breakfast Current orders for Inpatient glycemic control: 70/30 42 units with supper, Glipizide 10 mg QD, Novolog 0-15 units Q4H  Inpatient Diabetes Program Recommendations:    70/30 42 units with breakfast and Supper.  May need to titrate up for breakfast as his home dose is 48 units with breakfast.  Will continue to follow while inpatient.  Thank you, Dulce Sellar, MSN, RN Diabetes Coordinator Inpatient Diabetes Program [phone] (team pager from 8a-5p)

## 2021-04-17 NOTE — Discharge Instructions (Signed)
Take your diabetes medications as prescribed.

## 2021-04-18 DIAGNOSIS — F250 Schizoaffective disorder, bipolar type: Secondary | ICD-10-CM | POA: Diagnosis not present

## 2021-04-18 LAB — CBG MONITORING, ED
Glucose-Capillary: 69 mg/dL — ABNORMAL LOW (ref 70–99)
Glucose-Capillary: 100 mg/dL — ABNORMAL HIGH (ref 70–99)
Glucose-Capillary: 127 mg/dL — ABNORMAL HIGH (ref 70–99)
Glucose-Capillary: 67 mg/dL — ABNORMAL LOW (ref 70–99)
Glucose-Capillary: 186 mg/dL — ABNORMAL HIGH (ref 70–99)
Glucose-Capillary: 56 mg/dL — ABNORMAL LOW (ref 70–99)
Glucose-Capillary: 142 mg/dL — ABNORMAL HIGH (ref 70–99)

## 2021-04-18 NOTE — ED Notes (Signed)
Patient given snacks until breakfast arrives. Blood glucose check 69.

## 2021-04-18 NOTE — ED Notes (Signed)
Patient given sandwich and ginger ale 

## 2021-04-18 NOTE — ED Notes (Addendum)
Patient given 8 oz of orange juice for blood sugar of 56. Patient has been sleeping most of the day and not eating.

## 2021-04-18 NOTE — ED Notes (Signed)
Patient given orange juice

## 2021-04-18 NOTE — ED Notes (Signed)
CBG 100 at this time.

## 2021-04-19 ENCOUNTER — Ambulatory Visit (HOSPITAL_COMMUNITY)
Admission: EM | Admit: 2021-04-19 | Discharge: 2021-04-23 | Disposition: A | Discharge status: Other Acute Inpatient Hospital | Payer: Medicare Other | Attending: Urology | Admitting: Urology

## 2021-04-19 DIAGNOSIS — Z20822 Contact with and (suspected) exposure to covid-19: Secondary | ICD-10-CM | POA: Diagnosis not present

## 2021-04-19 DIAGNOSIS — F250 Schizoaffective disorder, bipolar type: Secondary | ICD-10-CM | POA: Insufficient documentation

## 2021-04-19 DIAGNOSIS — E1165 Type 2 diabetes mellitus with hyperglycemia: Secondary | ICD-10-CM | POA: Diagnosis not present

## 2021-04-19 DIAGNOSIS — Z7984 Long term (current) use of oral hypoglycemic drugs: Secondary | ICD-10-CM | POA: Diagnosis not present

## 2021-04-19 LAB — CBG MONITORING, ED
Glucose-Capillary: 200 mg/dL — ABNORMAL HIGH (ref 70–99)
Glucose-Capillary: 210 mg/dL — ABNORMAL HIGH (ref 70–99)
Glucose-Capillary: 243 mg/dL — ABNORMAL HIGH (ref 70–99)
Glucose-Capillary: 69 mg/dL — ABNORMAL LOW (ref 70–99)
Glucose-Capillary: 82 mg/dL (ref 70–99)

## 2021-04-19 MED ORDER — INSULIN ASPART 100 UNIT/ML IJ SOLN
0.0000 [IU] | INTRAMUSCULAR | Status: DC
  Administered 2021-04-19: 2 [IU] via SUBCUTANEOUS
  Administered 2021-04-19: 3 [IU] via SUBCUTANEOUS

## 2021-04-19 MED ORDER — CLOZAPINE 25 MG PO TABS
50.0000 mg | ORAL_TABLET | Freq: Two times a day (BID) | ORAL | Status: DC
  Administered 2021-04-20 – 2021-04-23 (×8): 50 mg via ORAL
  Filled 2021-04-19 (×8): qty 2

## 2021-04-19 MED ORDER — HYDROXYZINE HCL 25 MG PO TABS
25.0000 mg | ORAL_TABLET | Freq: Three times a day (TID) | ORAL | Status: DC | PRN
  Administered 2021-04-22: 22:00:00 25 mg via ORAL
  Filled 2021-04-19: qty 1

## 2021-04-19 MED ORDER — ALUM & MAG HYDROXIDE-SIMETH 200-200-20 MG/5ML PO SUSP: 30.0000 mL | ORAL | Status: DC | PRN

## 2021-04-19 MED ORDER — LITHIUM CARBONATE ER 450 MG PO TBCR
450.0000 mg | EXTENDED_RELEASE_TABLET | Freq: Two times a day (BID) | ORAL | Status: DC
  Administered 2021-04-20 – 2021-04-23 (×8): 450 mg via ORAL
  Filled 2021-04-19 (×8): qty 1

## 2021-04-19 MED ORDER — BENZTROPINE MESYLATE 1 MG PO TABS
1.0000 mg | ORAL_TABLET | Freq: Two times a day (BID) | ORAL | Status: DC
  Administered 2021-04-20 – 2021-04-23 (×8): 1 mg via ORAL
  Filled 2021-04-19 (×8): qty 1

## 2021-04-19 MED ORDER — INSULIN ASPART 100 UNIT/ML IJ SOLN
0.0000 [IU] | Freq: Three times a day (TID) | INTRAMUSCULAR | Status: DC
  Administered 2021-04-20: 1 [IU] via SUBCUTANEOUS
  Administered 2021-04-20: 5 [IU] via SUBCUTANEOUS
  Administered 2021-04-21: 09:00:00 2 [IU] via SUBCUTANEOUS
  Administered 2021-04-21: 12:00:00 7 [IU] via SUBCUTANEOUS
  Administered 2021-04-21: 18:00:00 3 [IU] via SUBCUTANEOUS
  Administered 2021-04-22: 13:00:00 7 [IU] via SUBCUTANEOUS
  Administered 2021-04-22: 17:00:00 5 [IU] via SUBCUTANEOUS
  Administered 2021-04-22: 08:00:00 3 [IU] via SUBCUTANEOUS
  Administered 2021-04-23: 7 [IU] via SUBCUTANEOUS
  Administered 2021-04-23 (×2): 2 [IU] via SUBCUTANEOUS

## 2021-04-19 MED ORDER — AMLODIPINE BESYLATE 10 MG PO TABS
10.0000 mg | ORAL_TABLET | Freq: Every day | ORAL | Status: DC
  Administered 2021-04-20 – 2021-04-23 (×4): 10 mg via ORAL
  Filled 2021-04-19 (×4): qty 1

## 2021-04-19 MED ORDER — ATENOLOL 25 MG PO TABS
12.5000 mg | ORAL_TABLET | Freq: Every day | ORAL | Status: DC
  Administered 2021-04-20 – 2021-04-23 (×4): 12.5 mg via ORAL
  Filled 2021-04-19 (×4): qty 1

## 2021-04-19 MED ORDER — LISINOPRIL 20 MG PO TABS
40.0000 mg | ORAL_TABLET | Freq: Every day | ORAL | Status: DC
  Administered 2021-04-20 – 2021-04-23 (×4): 40 mg via ORAL
  Filled 2021-04-19 (×4): qty 2

## 2021-04-19 MED ORDER — TRAZODONE HCL 50 MG PO TABS
50.0000 mg | ORAL_TABLET | Freq: Every day | ORAL | Status: DC
  Administered 2021-04-20 – 2021-04-22 (×3): 50 mg via ORAL
  Filled 2021-04-19 (×3): qty 1

## 2021-04-19 MED ORDER — ACETAMINOPHEN 325 MG PO TABS: 650.0000 mg | ORAL_TABLET | Freq: Four times a day (QID) | ORAL | Status: DC | PRN

## 2021-04-19 MED ORDER — TRAZODONE HCL 50 MG PO TABS: 50.0000 mg | ORAL_TABLET | Freq: Every evening | ORAL | Status: DC | PRN

## 2021-04-19 MED ORDER — HALOPERIDOL 5 MG PO TABS
20.0000 mg | ORAL_TABLET | Freq: Every day | ORAL | Status: DC
  Administered 2021-04-20 – 2021-04-23 (×4): 20 mg via ORAL
  Filled 2021-04-19 (×4): qty 4

## 2021-04-19 MED ORDER — INSULIN ASPART PROT & ASPART (70-30 MIX) 100 UNIT/ML ~~LOC~~ SUSP
42.0000 [IU] | Freq: Every day | SUBCUTANEOUS | Status: DC
Start: 2021-04-20 — End: 2021-04-23
  Administered 2021-04-20 – 2021-04-23 (×4): 42 [IU] via SUBCUTANEOUS

## 2021-04-19 MED ORDER — CLONIDINE HCL 0.1 MG PO TABS
0.2000 mg | ORAL_TABLET | Freq: Two times a day (BID) | ORAL | Status: DC
  Administered 2021-04-20 – 2021-04-23 (×8): 0.2 mg via ORAL
  Filled 2021-04-19 (×8): qty 2

## 2021-04-19 MED ORDER — MAGNESIUM HYDROXIDE 400 MG/5ML PO SUSP: 30.0000 mL | Freq: Every day | ORAL | Status: DC | PRN

## 2021-04-19 NOTE — ED Notes (Signed)
Still awaiting transport to [facility]

## 2021-04-19 NOTE — ED Notes (Signed)
Pt eating lunch, calm and cooperative. Pt appears to be speaking and responding to internal stimuli.

## 2021-04-19 NOTE — ED Notes (Signed)
Pt calm and cooperative, standing in the doorway. Asked if he needs anything, patient response "I am fine, don't worry about me" Pt then stepped back into room and appeared to be verbally responding to internal stimuli

## 2021-04-19 NOTE — ED Notes (Signed)
Pt ambulated independently to bathroom.

## 2021-04-19 NOTE — ED Notes (Signed)
Patient standing at door of room, looking around. Denies any needs at this time.

## 2021-04-19 NOTE — ED Notes (Signed)
Pt was given personal hygiene products and took a shower.

## 2021-04-19 NOTE — ED Notes (Signed)
Pt CGB 69. RN aware. Woke pt up to eat breakfast and offered pt orange juice. Pt ate 100% of meal and drank x2 orange juice. Will recheck CGB.

## 2021-04-19 NOTE — ED Notes (Signed)
Patient admitted to [facility]. Patient is delusional and responding to internal stimuli, appearing to have conversation with someone. Patient does not endorse SI or HI. Patient was cooperative during the admission assessment. Skin assessment complete. Belongings inventoried. Patient oriented to unit and unit rules. Meal and drinks consumed by patient.  Will continue to monitor for safety.

## 2021-04-19 NOTE — ED Notes (Signed)
[employer] called to get transportation for the patient to [facility].

## 2021-04-19 NOTE — Progress Notes (Signed)
Inpatient Diabetes Program Recommendations  AACE/ADA: New Consensus Statement on Inpatient Glycemic Control (2015)  Target Ranges:  Prepandial:   less than 140 mg/dL      Peak postprandial:   less than 180 mg/dL (1-2 hours)      Critically ill patients:  140 - 180 mg/dL   Lab Results  Component Value Date   GLUCAP 200 (H) 04/19/2021   HGBA1C 11.1 (H) 01/26/2021    Review of Glycemic Control  Diabetes history: DM2 Outpatient Diabetes medications: 70/30 48 units QQM and 42 units before supper, glipizide 20 mg QAM Current orders for Inpatient glycemic control: 70/30 42 units BID, Novolog 0-9 units Q4H  Inpatient Diabetes Program Recommendations:    Hypos x past 2 days.  Decrease 70/30 to 40 units BID  F/U in am.  Thank you. Lorenda Peck, RD, LDN, CDE Inpatient Diabetes Coordinator [phone]

## 2021-04-19 NOTE — Consult Note (Signed)
As noted by provider T. Allen Patient will be transported to [facility] emergency department for medical clearance, accepted by Dr. Ashok Cordia.  He can return to [facility] behavioral health once medically clear.  1/22-patient has been cleared back to [facility] and await inpatient admission. RN  Blima Singer to call report to observation unit.

## 2021-04-20 DIAGNOSIS — F250 Schizoaffective disorder, bipolar type: Secondary | ICD-10-CM | POA: Diagnosis not present

## 2021-04-20 LAB — GLUCOSE, CAPILLARY
Glucose-Capillary: 79 mg/dL (ref 70–99)
Glucose-Capillary: 227 mg/dL — ABNORMAL HIGH (ref 70–99)
Glucose-Capillary: 236 mg/dL — ABNORMAL HIGH (ref 70–99)
Glucose-Capillary: 140 mg/dL — ABNORMAL HIGH (ref 70–99)
Glucose-Capillary: 196 mg/dL — ABNORMAL HIGH (ref 70–99)

## 2021-04-20 NOTE — ED Notes (Signed)
Patient has been fixated on the water machine. Patient removed a plastic piece and now the machine is inoperable. Night AC stated maintenance will be contacted concerning repair.

## 2021-04-20 NOTE — ED Notes (Signed)
Pt up walking around unit, saying hello to staff members, calling them various names, and asking if they remember him. Pt then sits down in chair and begins pointing his comb at the ground and talking as if something is there, although he denies current AVH. Pt also denies current SI/HI. Pt is calm and cooperative with no signs of acute distress noted. Grape juice given per pt request. Pt denies any further needs at this time. Will continue to monitor for safety.

## 2021-04-20 NOTE — Progress Notes (Addendum)
This Clinical research associate was informed by [facility] that pt's CBG was 277. Pt was given 5 units Novolog S/S insulin based on that information. It took awhile for results to transfer from glucometer to pt's result review. This Clinical research associate checked later and CBG recorded was 227. No signs of acute distress noted. Will recheck CBG in an hour. Inetta Fermo, FNP notified in person. Will continue to monitor.

## 2021-04-20 NOTE — ED Notes (Signed)
Pt asleep in bed. Respirations even and unlabored. Will continue to monitor for safety. ?

## 2021-04-20 NOTE — ED Notes (Signed)
Pt is awake and alert this morning.  Denies SI, HI or AVH.  Pt has fair eye contact.  Blunted affect.   CBG 72 this am.  He received Zero units of insulin as per SS order.   Pt given breakfast and was eating cereal without incident.  Will continue to monitor for safety.

## 2021-04-20 NOTE — ED Provider Notes (Signed)
Behavioral Health Admission H&P [facility] & OBS)  Date: 04/20/21 Patient Name: Mitchell [name] MRN: [id number] Chief Complaint: No chief complaint on file.     Diagnoses:  Final diagnoses:  Schizoaffective disorder, bipolar type [facility])    HPI: Mitchell [name] is a 61 year old male with psychiatric history of schizoaffective disorder bipolar type.  [facility] was [facility] post treatment for hyperglycemia and medical clearance.  Patient is under involuntary commitment.   Patient was assessed face-to-face and his chart was reviewed by this nurse practitioner.  On evaluation, patient is alert and oriented to self and place.  Patient is irritable and minimally cooperative with assessment.  Patient's mood is irritable with congruent affect.  His thought process is disorganized.  Patient denies suicidal ideation, and homicidal ideation.  Patient also denied hallucination however he was noted to be speaking to himself while sitting in assessment room.  Patient appears to be responding to internal stimuli.  Patient reports that he does not feel like talking with this provider.  Patient says that he does not need to be here in the "hospital" and would like to go back to his house.  Patient repeatedly states " you do not need to worry about me, I am fine and I can take care of myself, the food here is bad."  Patient abruptly ended assessment by walking out and requested to return to bed.  PHQ 2-9:  Flowsheet Row ED from [facility]16/[facility]  Thoughts that you would be better off dead, or of hurting yourself in some way Not at all  PHQ-9 Total Score 0       Flowsheet Row ED from 04/19/2021 in [facility] Most recent reading at 04/19/2021 10:59 PM ED from 04/16/2021 in [facility] EMERGENCY DEPARTMENT Most recent reading at 04/19/2021 11:17 AM ED from 04/16/2021 in [facility] Most recent reading at  04/16/2021  6:06 PM  C-SSRS RISK CATEGORY No Risk No Risk No Risk        Total Time spent with patient: 15 minutes  Musculoskeletal  Strength & Muscle Tone: within normal limits Gait & Station: normal Patient leans: Right  Psychiatric Specialty Exam  Presentation General Appearance: Fairly Groomed  Eye Contact:Fair  Speech:Clear and Coherent  Speech Volume:Normal  Handedness:Right   Mood and Affect  Mood:Irritable  Affect:Congruent   Thought Process  Thought Processes:Disorganized  Descriptions of Associations:Loose  Orientation:Partial  Thought Content:Tangential  Diagnosis of Schizophrenia or Schizoaffective disorder in past: Yes  Duration of Psychotic Symptoms: Greater than six months  Hallucinations:Hallucinations: Other (comment) (pt denies hallucination but he is noted to be speaking to himself)  Ideas of Reference:None  Suicidal Thoughts:Suicidal Thoughts: No  Homicidal Thoughts:Homicidal Thoughts: No   Sensorium  Memory:Immediate Good; Recent Fair; Remote Fair  Judgment:Impaired  Insight:Poor   Executive Functions  Concentration:Poor  Attention Span:Poor  Recall:Fair  Fund of Knowledge:Fair  Language:Fair   Psychomotor Activity  Psychomotor Activity:Psychomotor Activity: Normal   Assets  Assets:Communication Skills; Desire for Improvement; Physical Health   Sleep  Sleep:Sleep: Good Number of Hours of Sleep: 8   Nutritional Assessment (For OBS and [facility]BC admissions only) Has the patient had a weight loss or gain of 10 pounds or more in the last 3 months?: No Has the patient had a decrease in food intake/or appetite?: No Does the patient have dental problems?: No Does the patient have eating habits or behaviors that may be indicators of an  eating disorder including binging or inducing vomiting?: No Has the patient recently lost weight without trying?: 0 Has the patient been eating poorly because of a decreased appetite?:  0 Malnutrition Screening Tool Score: 0    Physical Exam Vitals and nursing note reviewed.  Constitutional:      General: He is not in acute distress.    Appearance: He is well-developed. He is not ill-appearing.  HENT:     Head: Normocephalic and atraumatic.  Eyes:     Conjunctiva/sclera: Conjunctivae normal.  Cardiovascular:     Rate and Rhythm: Normal rate.     Heart sounds: No murmur heard. Pulmonary:     Effort: Pulmonary effort is normal. No respiratory distress.  Abdominal:     Tenderness: There is no abdominal tenderness.  Musculoskeletal:        General: No swelling.     Cervical back: Normal range of motion.  Skin:    General: Skin is warm and dry.     Capillary Refill: Capillary refill takes less than 2 seconds.  Neurological:     Mental Status: He is alert.  Psychiatric:        Mood and Affect: Affect is angry.        Speech: Speech normal.        Behavior: Behavior is uncooperative.        Thought Content: Thought content normal. Thought content does not include homicidal or suicidal ideation.   Review of Systems  Constitutional: Negative.   HENT: Negative.    Eyes: Negative.   Respiratory: Negative.    Cardiovascular: Negative.   Gastrointestinal: Negative.   Genitourinary: Negative.   Musculoskeletal: Negative.   Skin: Negative.   Neurological: Negative.   Endo/Heme/Allergies: Negative.   Psychiatric/Behavioral:  The patient is nervous/anxious.    Blood pressure 140/88, pulse 87, temperature 98.9 F (37.2 C), temperature source Oral, resp. rate 18, SpO2 100 %. There is no height or weight on file to calculate BMI.  Past Psychiatric History:    Is the patient at risk to self? Yes  Has the patient been a risk to self in the past 6 months? Yes .    Has the patient been a risk to self within the distant past? Yes   Is the patient a risk to others? Yes   Has the patient been a risk to others in the past 6 months? No   Has the patient been a risk to  others within the distant past? No   Past Medical History:  Past Medical History:  Diagnosis Date   Bipolar affective disorder (HCC)    takes Zyprexa daily   Diabetes mellitus    takes Victoza,Metformin,and Glipizide daily   Hypertension    takes Amlodipine,Lisinopril and Clonidine daily   Hyponatremia    history of   Mental disorder    takes Lithium daily   Schizoaffective disorder    takes Trazodone nightly   Seasonal allergies    takes Claritin daily   Sleep apnea    sleep study >35yrs ago   Stroke [facility])    left arm weakness    Past Surgical History:  Procedure Laterality Date   CATARACT EXTRACTION W/PHACO Right 02/14/2013   Procedure: CATARACT EXTRACTION PHACO AND INTRAOCULAR LENS PLACEMENT (IOC);  Surgeon: Shade Flood, MD;  Location: [facility] OR;  Service: Ophthalmology;  Laterality: Right;   CATARACT EXTRACTION W/PHACO Left 06/13/2013   Procedure: CATARACT EXTRACTION PHACO AND INTRAOCULAR LENS PLACEMENT (IOC);  Surgeon: Shade Flood, MD;  Location: [facility] OR;  Service: Ophthalmology;  Laterality: Left;   CIRCUMCISION  20 yrs. ago   EYE SURGERY      Family History: No family history on file.  Social History:  Social History   Socioeconomic History   Marital status: Divorced    Spouse name: Not on file   Number of children: Not on file   Years of education: Not on file   Highest education level: Not on file  Occupational History   Not on file  Tobacco Use   Smoking status: Never   Smokeless tobacco: Never  Vaping Use   Vaping Use: Never used  Substance and Sexual Activity   Alcohol use: Not Currently   Drug use: No   Sexual activity: Yes    Birth control/protection: None  Other Topics Concern   Not on file  Social History Narrative   Not on file   Social Determinants of Health   Financial Resource Strain: Not on file  Food Insecurity: Not on file  Transportation Needs: Not on file  Physical Activity: Not on file  Stress: Not on file  Social Connections:  Not on file  Intimate Partner Violence: Not on file    SDOH:  SDOH Screenings   Alcohol Screen: Low Risk    Last Alcohol Screening Score (AUDIT): 0  Depression (PHQ2-9): Low Risk    PHQ-2 Score: 0  Financial Resource Strain: Not on file  Food Insecurity: Not on file  Housing: Not on file  Physical Activity: Not on file  Social Connections: Not on file  Stress: Not on file  Tobacco Use: Low Risk    Smoking Tobacco Use: Never   Smokeless Tobacco Use: Never   Passive Exposure: Not on file  Transportation Needs: Not on file    Last Labs:  Admission on 04/16/2021, Discharged on 04/19/2021  Component Date Value Ref Range Status   Sodium 04/16/2021 128 (L)  135 - 145 mmol/L Final   Potassium 04/16/2021 5.0  3.5 - 5.1 mmol/L Final   Chloride 04/16/2021 95 (L)  98 - 111 mmol/L Final   CO2 04/16/2021 22  22 - 32 mmol/L Final   Glucose, Bld 04/16/2021 492 (H)  70 - 99 mg/dL Final   Glucose reference range applies only to samples taken after fasting for at least 8 hours.   BUN 04/16/2021 19  6 - 20 mg/dL Final   Creatinine, Ser 04/16/2021 1.61 (H)  0.61 - 1.24 mg/dL Final   Calcium 36/62/947601/19/2023 9.0  8.9 - 10.3 mg/dL Final   Total Protein 54/65/035401/19/2023 7.3  6.5 - 8.1 g/dL Final   Albumin 65/68/127501/19/2023 4.0  3.5 - 5.0 g/dL Final   AST 17/00/174901/19/2023 28  15 - 41 U/L Final   ALT 04/16/2021 21  0 - 44 U/L Final   Alkaline Phosphatase 04/16/2021 110  38 - 126 U/L Final   Total Bilirubin 04/16/2021 0.9  0.3 - 1.2 mg/dL Final   GFR, Estimated 04/16/2021 49 (L)  >60 mL/min Final   Comment: (NOTE) Calculated using the CKD-EPI Creatinine Equation (2021)    Anion gap 04/16/2021 11  5 - 15 Final   Performed at [facility], [address] [place], [place] [address]   Alcohol, Ethyl (B) 04/16/2021 <10  <10 mg/dL Final   Comment: (NOTE) Lowest detectable limit for serum alcohol is 10 mg/dL.  For medical purposes only. Performed at [facility], [address] [place], [place] [address]     WBC 04/16/2021  6.0  4.0 - 10.5 K/uL Final   RBC 04/16/2021 4.31  4.22 - 5.81 MIL/uL Final   Hemoglobin 04/16/2021 13.3  13.0 - 17.0 g/dL Final   HCT 65/78/4696 40.0  39.0 - 52.0 % Final   MCV 04/16/2021 92.8  80.0 - 100.0 fL Final   MCH 04/16/2021 30.9  26.0 - 34.0 pg Final   MCHC 04/16/2021 33.3  30.0 - 36.0 g/dL Final   RDW 29/52/8413 12.7  11.5 - 15.5 % Final   Platelets 04/16/2021 232  150 - 400 K/uL Final   nRBC 04/16/2021 0.0  0.0 - 0.2 % Final   Performed at [facility], [address] [place], [place] [address]   Opiates 04/16/2021 NONE DETECTED  NONE DETECTED Final   Cocaine 04/16/2021 NONE DETECTED  NONE DETECTED Final   Benzodiazepines 04/16/2021 NONE DETECTED  NONE DETECTED Final   Amphetamines 04/16/2021 NONE DETECTED  NONE DETECTED Final   Tetrahydrocannabinol 04/16/2021 NONE DETECTED  NONE DETECTED Final   Barbiturates 04/16/2021 NONE DETECTED  NONE DETECTED Final   Comment: (NOTE) DRUG SCREEN FOR MEDICAL PURPOSES ONLY.  IF CONFIRMATION IS NEEDED FOR ANY PURPOSE, NOTIFY LAB WITHIN 5 DAYS.  LOWEST DETECTABLE LIMITS FOR URINE DRUG SCREEN Drug Class                     Cutoff (ng/mL) Amphetamine and metabolites    1000 Barbiturate and metabolites    200 Benzodiazepine                 200 Tricyclics and metabolites     300 Opiates and metabolites        300 Cocaine and metabolites        300 THC                            50 Performed at [facility], [address] [place], [place] [address]    Glucose-Capillary 04/16/2021 486 (H)  70 - 99 mg/dL Final   Glucose reference range applies only to samples taken after fasting for at least 8 hours.   Lithium Lvl 04/16/2021 0.32 (L)  0.60 - 1.20 mmol/L Final   Performed at [facility], [address], [place], [place] [address]   SARS Coronavirus 2 by RT PCR 04/16/2021 NEGATIVE  NEGATIVE Final   Comment: (NOTE) SARS-CoV-2 target nucleic acids are NOT DETECTED.  The SARS-CoV-2 RNA is generally  detectable in upper respiratory specimens during the acute phase of infection. The lowest concentration of SARS-CoV-2 viral copies this assay can detect is 138 copies/mL. A negative result does not preclude SARS-Cov-2 infection and should not be used as the sole basis for treatment or other patient management decisions. A negative result may occur with  improper specimen collection/handling, submission of specimen other than nasopharyngeal swab, presence of viral mutation(s) within the areas targeted by this assay, and inadequate number of viral copies(<138 copies/mL). A negative result must be combined with clinical observations, patient history, and epidemiological information. The expected result is Negative.  Fact Sheet for Patients:  BloggerCourse.com  Fact Sheet for Healthcare Providers:  SeriousBroker.it  This test is no                          t yet approved or cleared by the [place] FDA and  has been authorized for detection and/or diagnosis of SARS-CoV-2 by FDA  under an Emergency Use Authorization (EUA). This EUA will remain  in effect (meaning this test can be used) for the duration of the COVID-19 declaration under Section 564(b)(1) of the Act, 21 U.S.C.section 360bbb-3(b)(1), unless the authorization is terminated  or revoked sooner.       Influenza A by PCR 04/16/2021 NEGATIVE  NEGATIVE Final   Influenza B by PCR 04/16/2021 NEGATIVE  NEGATIVE Final   Comment: (NOTE) The Xpert Xpress SARS-CoV-2/FLU/RSV plus assay is intended as an aid in the diagnosis of influenza from Nasopharyngeal swab specimens and should not be used as a sole basis for treatment. Nasal washings and aspirates are unacceptable for Xpert Xpress SARS-CoV-2/FLU/RSV testing.  Fact Sheet for Patients: BloggerCourse.com  Fact Sheet for Healthcare Providers: SeriousBroker.it  This test is not  yet approved or cleared by the [place] FDA and has been authorized for detection and/or diagnosis of SARS-CoV-2 by FDA under an Emergency Use Authorization (EUA). This EUA will remain in effect (meaning this test can be used) for the duration of the COVID-19 declaration under Section 564(b)(1) of the Act, 21 U.S.C. section 360bbb-3(b)(1), unless the authorization is terminated or revoked.  Performed at [facility], [address], [place], [place] [address]    Glucose-Capillary 04/16/2021 451 (H)  70 - 99 mg/dL Final   Glucose reference range applies only to samples taken after fasting for at least 8 hours.   Glucose-Capillary 04/16/2021 373 (H)  70 - 99 mg/dL Final   Glucose reference range applies only to samples taken after fasting for at least 8 hours.   WBC 04/16/2021 5.8  4.0 - 10.5 K/uL Final   RBC 04/16/2021 4.07 (L)  4.22 - 5.81 MIL/uL Final   Hemoglobin 04/16/2021 12.8 (L)  13.0 - 17.0 g/dL Final   HCT 19/14/7829 37.1 (L)  39.0 - 52.0 % Final   MCV 04/16/2021 91.2  80.0 - 100.0 fL Final   MCH 04/16/2021 31.4  26.0 - 34.0 pg Final   MCHC 04/16/2021 34.5  30.0 - 36.0 g/dL Final   RDW 56/21/3086 12.8  11.5 - 15.5 % Final   Platelets 04/16/2021 213  150 - 400 K/uL Final   nRBC 04/16/2021 0.0  0.0 - 0.2 % Final   Neutrophils Relative % 04/16/2021 45  % Final   Neutro Abs 04/16/2021 2.6  1.7 - 7.7 K/uL Final   Lymphocytes Relative 04/16/2021 43  % Final   Lymphs Abs 04/16/2021 2.5  0.7 - 4.0 K/uL Final   Monocytes Relative 04/16/2021 8  % Final   Monocytes Absolute 04/16/2021 0.5  0.1 - 1.0 K/uL Final   Eosinophils Relative 04/16/2021 3  % Final   Eosinophils Absolute 04/16/2021 0.2  0.0 - 0.5 K/uL Final   Basophils Relative 04/16/2021 1  % Final   Basophils Absolute 04/16/2021 0.0  0.0 - 0.1 K/uL Final   Immature Granulocytes 04/16/2021 0  % Final   Abs Immature Granulocytes 04/16/2021 0.01  0.00 - 0.07 K/uL Final   Performed at [facility], [address], [place], [place] [address]   Glucose-Capillary 04/16/2021 270 (H)  70 - 99 mg/dL Final   Glucose reference range applies only to samples taken after fasting for at least 8 hours.   Glucose-Capillary 04/17/2021 410 (H)  70 - 99 mg/dL Final   Glucose reference range applies only to samples taken after fasting for at least 8 hours.   Glucose-Capillary 04/17/2021 381 (H)  70 - 99 mg/dL Final  Glucose reference range applies only to samples taken after fasting for at least 8 hours.   Glucose-Capillary 04/17/2021 392 (H)  70 - 99 mg/dL Final   Glucose reference range applies only to samples taken after fasting for at least 8 hours.   Glucose-Capillary 04/17/2021 211 (H)  70 - 99 mg/dL Final   Glucose reference range applies only to samples taken after fasting for at least 8 hours.   Glucose-Capillary 04/18/2021 69 (L)  70 - 99 mg/dL Final   Glucose reference range applies only to samples taken after fasting for at least 8 hours.   Glucose-Capillary 04/18/2021 100 (H)  70 - 99 mg/dL Final   Glucose reference range applies only to samples taken after fasting for at least 8 hours.   Glucose-Capillary 04/18/2021 127 (H)  70 - 99 mg/dL Final   Glucose reference range applies only to samples taken after fasting for at least 8 hours.   Glucose-Capillary 04/18/2021 67 (L)  70 - 99 mg/dL Final   Glucose reference range applies only to samples taken after fasting for at least 8 hours.   Glucose-Capillary 04/18/2021 186 (H)  70 - 99 mg/dL Final   Glucose reference range applies only to samples taken after fasting for at least 8 hours.   Glucose-Capillary 04/18/2021 56 (L)  70 - 99 mg/dL Final   Glucose reference range applies only to samples taken after fasting for at least 8 hours.   Glucose-Capillary 04/18/2021 142 (H)  70 - 99 mg/dL Final   Glucose reference range applies only to samples taken after fasting for at least 8 hours.   Glucose-Capillary 04/19/2021 82  70 - 99 mg/dL Final   Glucose  reference range applies only to samples taken after fasting for at least 8 hours.   Glucose-Capillary 04/19/2021 69 (L)  70 - 99 mg/dL Final   Glucose reference range applies only to samples taken after fasting for at least 8 hours.   Glucose-Capillary 04/19/2021 210 (H)  70 - 99 mg/dL Final   Glucose reference range applies only to samples taken after fasting for at least 8 hours.   Glucose-Capillary 04/19/2021 243 (H)  70 - 99 mg/dL Final   Glucose reference range applies only to samples taken after fasting for at least 8 hours.   Glucose-Capillary 04/19/2021 200 (H)  70 - 99 mg/dL Final   Glucose reference range applies only to samples taken after fasting for at least 8 hours.  Admission on 04/16/2021, Discharged on 04/16/2021  Component Date Value Ref Range Status   SARS Coronavirus 2 by RT PCR 04/16/2021 NEGATIVE  NEGATIVE Final   Comment: (NOTE) SARS-CoV-2 target nucleic acids are NOT DETECTED.  The SARS-CoV-2 RNA is generally detectable in upper respiratory specimens during the acute phase of infection. The lowest concentration of SARS-CoV-2 viral copies this assay can detect is 138 copies/mL. A negative result does not preclude SARS-Cov-2 infection and should not be used as the sole basis for treatment or other patient management decisions. A negative result may occur with  improper specimen collection/handling, submission of specimen other than nasopharyngeal swab, presence of viral mutation(s) within the areas targeted by this assay, and inadequate number of viral copies(<138 copies/mL). A negative result must be combined with clinical observations, patient history, and epidemiological information. The expected result is Negative.  Fact Sheet for Patients:  BloggerCourse.comhttps://www.fda.gov/media/152166/download  Fact Sheet for Healthcare Providers:  SeriousBroker.ithttps://www.fda.gov/media/152162/download  This test is no  t yet approved or cleared by the [place]  and  has been authorized for detection and/or diagnosis of SARS-CoV-2 by FDA under an Emergency Use Authorization (EUA). This EUA will remain  in effect (meaning this test can be used) for the duration of the COVID-19 declaration under Section 564(b)(1) of the Act, 21 U.S.C.section 360bbb-3(b)(1), unless the authorization is terminated  or revoked sooner.       Influenza A by PCR 04/16/2021 NEGATIVE  NEGATIVE Final   Influenza B by PCR 04/16/2021 NEGATIVE  NEGATIVE Final   Comment: (NOTE) The Xpert Xpress SARS-CoV-2/FLU/RSV plus assay is intended as an aid in the diagnosis of influenza from Nasopharyngeal swab specimens and should not be used as a sole basis for treatment. Nasal washings and aspirates are unacceptable for Xpert Xpress SARS-CoV-2/FLU/RSV testing.  Fact Sheet for Patients: BloggerCourse.com  Fact Sheet for Healthcare Providers: SeriousBroker.it  This test is not yet approved or cleared by the [place] FDA and has been authorized for detection and/or diagnosis of SARS-CoV-2 by FDA under an Emergency Use Authorization (EUA). This EUA will remain in effect (meaning this test can be used) for the duration of the COVID-19 declaration under Section 564(b)(1) of the Act, 21 U.S.C. section 360bbb-3(b)(1), unless the authorization is terminated or revoked.  Performed at [facility], [address], [place], [place] [address]    POC Amphetamine UR 04/16/2021 None Detected  NONE DETECTED (Cut Off Level 1000 ng/mL) Final   POC Secobarbital (BAR) 04/16/2021 None Detected  NONE DETECTED (Cut Off Level 300 ng/mL) Final   POC Buprenorphine (BUP) 04/16/2021 None Detected  NONE DETECTED (Cut Off Level 10 ng/mL) Final   POC Oxazepam (BZO) 04/16/2021 None Detected  NONE DETECTED (Cut Off Level 300 ng/mL) Final   POC Cocaine UR 04/16/2021 None Detected  NONE DETECTED (Cut Off Level 300 ng/mL) Final   POC Methamphetamine  UR 04/16/2021 None Detected  NONE DETECTED (Cut Off Level 1000 ng/mL) Final   POC Morphine 04/16/2021 None Detected  NONE DETECTED (Cut Off Level 300 ng/mL) Final   POC Oxycodone UR 04/16/2021 None Detected  NONE DETECTED (Cut Off Level 100 ng/mL) Final   POC Methadone UR 04/16/2021 None Detected  NONE DETECTED (Cut Off Level 300 ng/mL) Final   POC Marijuana UR 04/16/2021 None Detected  NONE DETECTED (Cut Off Level 50 ng/mL) Final   SARS Coronavirus 2 Ag 04/16/2021 Negative  Negative Final  No results displayed because visit has over 200 results.    Admission on 01/26/2021, Discharged on 01/28/2021  Component Date Value Ref Range Status   SARS Coronavirus 2 by RT PCR 01/26/2021 NEGATIVE  NEGATIVE Final   Comment: (NOTE) SARS-CoV-2 target nucleic acids are NOT DETECTED.  The SARS-CoV-2 RNA is generally detectable in upper respiratory specimens during the acute phase of infection. The lowest concentration of SARS-CoV-2 viral copies this assay can detect is 138 copies/mL. A negative result does not preclude SARS-Cov-2 infection and should not be used as the sole basis for treatment or other patient management decisions. A negative result may occur with  improper specimen collection/handling, submission of specimen other than nasopharyngeal swab, presence of viral mutation(s) within the areas targeted by this assay, and inadequate number of viral copies(<138 copies/mL). A negative result must be combined with clinical observations, patient history, and epidemiological information. The expected result is Negative.  Fact Sheet for Patients:  BloggerCourse.com  Fact Sheet for Healthcare Providers:  SeriousBroker.it  This test is no  t yet approved or cleared by the [place] and  has been authorized for detection and/or diagnosis of SARS-CoV-2 by FDA under an Emergency Use Authorization (EUA). This EUA will  remain  in effect (meaning this test can be used) for the duration of the COVID-19 declaration under Section 564(b)(1) of the Act, 21 U.S.C.section 360bbb-3(b)(1), unless the authorization is terminated  or revoked sooner.       Influenza A by PCR 01/26/2021 NEGATIVE  NEGATIVE Final   Influenza B by PCR 01/26/2021 NEGATIVE  NEGATIVE Final   Comment: (NOTE) The Xpert Xpress SARS-CoV-2/FLU/RSV plus assay is intended as an aid in the diagnosis of influenza from Nasopharyngeal swab specimens and should not be used as a sole basis for treatment. Nasal washings and aspirates are unacceptable for Xpert Xpress SARS-CoV-2/FLU/RSV testing.  Fact Sheet for Patients: BloggerCourse.com  Fact Sheet for Healthcare Providers: SeriousBroker.it  This test is not yet approved or cleared by the [place] FDA and has been authorized for detection and/or diagnosis of SARS-CoV-2 by FDA under an Emergency Use Authorization (EUA). This EUA will remain in effect (meaning this test can be used) for the duration of the COVID-19 declaration under Section 564(b)(1) of the Act, 21 U.S.C. section 360bbb-3(b)(1), unless the authorization is terminated or revoked.  Performed at [facility], [address] [place], [place] [address]    Sodium 01/26/2021 137  135 - 145 mmol/L Final   Potassium 01/26/2021 4.0  3.5 - 5.1 mmol/L Final   Chloride 01/26/2021 101  98 - 111 mmol/L Final   CO2 01/26/2021 25  22 - 32 mmol/L Final   Glucose, Bld 01/26/2021 269 (H)  70 - 99 mg/dL Final   Glucose reference range applies only to samples taken after fasting for at least 8 hours.   BUN 01/26/2021 13  6 - 20 mg/dL Final   Creatinine, Ser 01/26/2021 1.45 (H)  0.61 - 1.24 mg/dL Final   Calcium 60/45/4098 9.2  8.9 - 10.3 mg/dL Final   Total Protein 11/91/4782 7.9  6.5 - 8.1 g/dL Final   Albumin 95/62/1308 4.3  3.5 - 5.0 g/dL Final   AST 65/78/4696 30  15 - 41 U/L  Final   ALT 01/26/2021 26  0 - 44 U/L Final   Alkaline Phosphatase 01/26/2021 105  38 - 126 U/L Final   Total Bilirubin 01/26/2021 1.4 (H)  0.3 - 1.2 mg/dL Final   GFR, Estimated 01/26/2021 56 (L)  >60 mL/min Final   Comment: (NOTE) Calculated using the CKD-EPI Creatinine Equation (2021)    Anion gap 01/26/2021 11  5 - 15 Final   Performed at [facility], [address], [place], [place] [address]   Alcohol, Ethyl (B) 01/26/2021 <10  <10 mg/dL Final   Comment: (NOTE) Lowest detectable limit for serum alcohol is 10 mg/dL.  For medical purposes only. Performed at [facility], [address], [place], [place] [address]    WBC 01/26/2021 7.1  4.0 - 10.5 K/uL Final   RBC 01/26/2021 4.76  4.22 - 5.81 MIL/uL Final   Hemoglobin 01/26/2021 14.7  13.0 - 17.0 g/dL Final   HCT 40/12/2723 45.2  39.0 - 52.0 % Final   MCV 01/26/2021 95.0  80.0 - 100.0 fL Final   MCH 01/26/2021 30.9  26.0 - 34.0 pg Final   MCHC 01/26/2021 32.5  30.0 - 36.0 g/dL Final   RDW 36/64/4034 12.8  11.5 - 15.5 % Final   Platelets 01/26/2021  277  150 - 400 K/uL Final   nRBC 01/26/2021 0.0  0.0 - 0.2 % Final   Neutrophils Relative % 01/26/2021 46  % Final   Neutro Abs 01/26/2021 3.3  1.7 - 7.7 K/uL Final   Lymphocytes Relative 01/26/2021 39  % Final   Lymphs Abs 01/26/2021 2.7  0.7 - 4.0 K/uL Final   Monocytes Relative 01/26/2021 10  % Final   Monocytes Absolute 01/26/2021 0.7  0.1 - 1.0 K/uL Final   Eosinophils Relative 01/26/2021 4  % Final   Eosinophils Absolute 01/26/2021 0.3  0.0 - 0.5 K/uL Final   Basophils Relative 01/26/2021 1  % Final   Basophils Absolute 01/26/2021 0.1  0.0 - 0.1 K/uL Final   Immature Granulocytes 01/26/2021 0  % Final   Abs Immature Granulocytes 01/26/2021 0.01  0.00 - 0.07 K/uL Final   Performed at [facility], [address], [place], [place] [address]   Hgb A1c MFr Bld 01/26/2021 11.1 (H)  4.8 - 5.6 % Final   Comment: (NOTE) Pre diabetes:           5.7%-6.4%  Diabetes:              >6.4%  Glycemic control for   <7.0% adults with diabetes    Mean Plasma Glucose 01/26/2021 271.87  mg/dL Final   Performed at [facility], [address] [place], [place] [address]   Glucose-Capillary 01/26/2021 381 (H)  70 - 99 mg/dL Final   Glucose reference range applies only to samples taken after fasting for at least 8 hours.   Lithium Lvl 01/27/2021 0.08 (L)  0.60 - 1.20 mmol/L Final   Performed at [facility], [address], [place], [place] [address]   Glucose-Capillary 01/27/2021 400 (H)  70 - 99 mg/dL Final   Glucose reference range applies only to samples taken after fasting for at least 8 hours.   Glucose-Capillary 01/27/2021 308 (H)  70 - 99 mg/dL Final   Glucose reference range applies only to samples taken after fasting for at least 8 hours.   Glucose-Capillary 01/27/2021 113 (H)  70 - 99 mg/dL Final   Glucose reference range applies only to samples taken after fasting for at least 8 hours.   Glucose-Capillary 01/27/2021 306 (H)  70 - 99 mg/dL Final   Glucose reference range applies only to samples taken after fasting for at least 8 hours.   Glucose-Capillary 01/27/2021 191 (H)  70 - 99 mg/dL Final   Glucose reference range applies only to samples taken after fasting for at least 8 hours.   Glucose-Capillary 01/28/2021 246 (H)  70 - 99 mg/dL Final   Glucose reference range applies only to samples taken after fasting for at least 8 hours.   SARSCOV2ONAVIRUS 2 AG 01/28/2021 NEGATIVE  NEGATIVE Final   Comment: (NOTE) SARS-CoV-2 antigen NOT DETECTED.   Negative results are presumptive.  Negative results do not preclude SARS-CoV-2 infection and should not be used as the sole basis for treatment or other patient management decisions, including infection  control decisions, particularly in the presence of clinical signs and  symptoms consistent with COVID-19, or in those who have been in contact with the virus.  Negative  results must be combined with clinical observations, patient history, and epidemiological information. The expected result is Negative.  Fact Sheet for Patients: https://www.jennings-kim.com/  Fact Sheet for Healthcare Providers: https://alexander-rogers.biz/  This test is not yet approved or cleared by the [place] FDA  and  has been authorized for detection and/or diagnosis of SARS-CoV-2 by FDA under an Emergency Use Authorization (EUA).  This EUA will remain in effect (meaning this test can be used) for the duration of  the COV                          ID-19 declaration under Section 564(b)(1) of the Act, 21 U.S.C. section 360bbb-3(b)(1), unless the authorization is terminated or revoked sooner.     Glucose-Capillary 01/28/2021 281 (H)  70 - 99 mg/dL Final   Glucose reference range applies only to samples taken after fasting for at least 8 hours.  Admission on 01/21/2021, Discharged on 01/21/2021  Component Date Value Ref Range Status   Sodium 01/21/2021 134 (L)  135 - 145 mmol/L Final   Potassium 01/21/2021 4.1  3.5 - 5.1 mmol/L Final   Chloride 01/21/2021 97 (L)  98 - 111 mmol/L Final   CO2 01/21/2021 27  22 - 32 mmol/L Final   Glucose, Bld 01/21/2021 316 (H)  70 - 99 mg/dL Final   Glucose reference range applies only to samples taken after fasting for at least 8 hours.   BUN 01/21/2021 17  6 - 20 mg/dL Final   Creatinine, Ser 01/21/2021 1.39 (H)  0.61 - 1.24 mg/dL Final   Calcium 09/81/1914 9.5  8.9 - 10.3 mg/dL Final   Total Protein 78/29/5621 7.7  6.5 - 8.1 g/dL Final   Albumin 30/86/5784 4.0  3.5 - 5.0 g/dL Final   AST 69/62/9528 22  15 - 41 U/L Final   ALT 01/21/2021 24  0 - 44 U/L Final   Alkaline Phosphatase 01/21/2021 96  38 - 126 U/L Final   Total Bilirubin 01/21/2021 1.0  0.3 - 1.2 mg/dL Final   GFR, Estimated 01/21/2021 58 (L)  >60 mL/min Final   Comment: (NOTE) Calculated using the CKD-EPI Creatinine Equation (2021)    Anion gap  01/21/2021 10  5 - 15 Final   Performed at [facility], [address], [place], [place] [address]   Alcohol, Ethyl (B) 01/21/2021 <10  <10 mg/dL Final   Comment: (NOTE) Lowest detectable limit for serum alcohol is 10 mg/dL.  For medical purposes only. Performed at [facility], [address] [place], [place] [address]    Opiates 01/21/2021 NONE DETECTED  NONE DETECTED Final   Cocaine 01/21/2021 NONE DETECTED  NONE DETECTED Final   Benzodiazepines 01/21/2021 NONE DETECTED  NONE DETECTED Final   Amphetamines 01/21/2021 NONE DETECTED  NONE DETECTED Final   Tetrahydrocannabinol 01/21/2021 NONE DETECTED  NONE DETECTED Final   Barbiturates 01/21/2021 NONE DETECTED  NONE DETECTED Final   Comment: (NOTE) DRUG SCREEN FOR MEDICAL PURPOSES ONLY.  IF CONFIRMATION IS NEEDED FOR ANY PURPOSE, NOTIFY LAB WITHIN 5 DAYS.  LOWEST DETECTABLE LIMITS FOR URINE DRUG SCREEN Drug Class                     Cutoff (ng/mL) Amphetamine and metabolites    1000 Barbiturate and metabolites    200 Benzodiazepine                 200 Tricyclics and metabolites     300 Opiates and metabolites        300 Cocaine and metabolites        300 THC  50 Performed at [facility], [name], [place], [place] [address]    WBC 01/21/2021 5.9  4.0 - 10.5 K/uL Final   RBC 01/21/2021 4.93  4.22 - 5.81 MIL/uL Final   Hemoglobin 01/21/2021 15.4  13.0 - 17.0 g/dL Final   HCT 40/34/7425 45.0  39.0 - 52.0 % Final   MCV 01/21/2021 91.3  80.0 - 100.0 fL Final   MCH 01/21/2021 31.2  26.0 - 34.0 pg Final   MCHC 01/21/2021 34.2  30.0 - 36.0 g/dL Final   RDW 95/63/8756 13.0  11.5 - 15.5 % Final   Platelets 01/21/2021 271  150 - 400 K/uL Final   nRBC 01/21/2021 0.0  0.0 - 0.2 % Final   Neutrophils Relative % 01/21/2021 54  % Final   Neutro Abs 01/21/2021 3.2  1.7 - 7.7 K/uL Final   Lymphocytes Relative 01/21/2021 32  % Final   Lymphs Abs 01/21/2021 1.9  0.7 - 4.0 K/uL Final    Monocytes Relative 01/21/2021 9  % Final   Monocytes Absolute 01/21/2021 0.6  0.1 - 1.0 K/uL Final   Eosinophils Relative 01/21/2021 4  % Final   Eosinophils Absolute 01/21/2021 0.3  0.0 - 0.5 K/uL Final   Basophils Relative 01/21/2021 1  % Final   Basophils Absolute 01/21/2021 0.0  0.0 - 0.1 K/uL Final   Immature Granulocytes 01/21/2021 0  % Final   Abs Immature Granulocytes 01/21/2021 0.01  0.00 - 0.07 K/uL Final   Performed at [facility], [address] [place], [place] [address]   Lithium Lvl 01/21/2021 0.47 (L)  0.60 - 1.20 mmol/L Final   Performed at [facility], [address] [place], [place] [address]  Admission on 01/08/2021, Discharged on 01/08/2021  Component Date Value Ref Range Status   Glucose-Capillary 01/08/2021 405 (H)  70 - 99 mg/dL Final   Glucose reference range applies only to samples taken after fasting for at least 8 hours.   Sodium 01/08/2021 134 (L)  135 - 145 mmol/L Final   Potassium 01/08/2021 3.6  3.5 - 5.1 mmol/L Final   Chloride 01/08/2021 101  98 - 111 mmol/L Final   CO2 01/08/2021 25  22 - 32 mmol/L Final   Glucose, Bld 01/08/2021 415 (H)  70 - 99 mg/dL Final   Glucose reference range applies only to samples taken after fasting for at least 8 hours.   BUN 01/08/2021 12  6 - 20 mg/dL Final   Creatinine, Ser 01/08/2021 1.00  0.61 - 1.24 mg/dL Final   Calcium 16/60/6301 8.0 (L)  8.9 - 10.3 mg/dL Final   GFR, Estimated 01/08/2021 >60  >60 mL/min Final   Comment: (NOTE) Calculated using the CKD-EPI Creatinine Equation (2021)    Anion gap 01/08/2021 8  5 - 15 Final   Performed at [facility], [address], [place], [place] [address]   WBC 01/08/2021 4.1  4.0 - 10.5 K/uL Final   RBC 01/08/2021 4.45  4.22 - 5.81 MIL/uL Final   Hemoglobin 01/08/2021 13.8  13.0 - 17.0 g/dL Final   HCT 32/35/5732 40.7  39.0 - 52.0 % Final   MCV 01/08/2021 91.5  80.0 - 100.0 fL Final   MCH 01/08/2021 31.0  26.0 - 34.0 pg Final   MCHC  01/08/2021 33.9  30.0 - 36.0 g/dL Final   RDW 20/25/4270 12.8  11.5 - 15.5 % Final   Platelets 01/08/2021 227  150 - 400 K/uL Final  nRBC 01/08/2021 0.0  0.0 - 0.2 % Final   Performed at [facility], [address], [place], [place] [address]   Color, Urine 01/08/2021 STRAW (A)  YELLOW Final   APPearance 01/08/2021 CLEAR  CLEAR Final   Specific Gravity, Urine 01/08/2021 1.015  1.005 - 1.030 Final   pH 01/08/2021 7.0  5.0 - 8.0 Final   Glucose, UA 01/08/2021 >=500 (A)  NEGATIVE mg/dL Final   Hgb urine dipstick 01/08/2021 NEGATIVE  NEGATIVE Final   Bilirubin Urine 01/08/2021 NEGATIVE  NEGATIVE Final   Ketones, ur 01/08/2021 5 (A)  NEGATIVE mg/dL Final   Protein, ur 60/45/4098 NEGATIVE  NEGATIVE mg/dL Final   Nitrite 11/91/4782 NEGATIVE  NEGATIVE Final   Leukocytes,Ua 01/08/2021 NEGATIVE  NEGATIVE Final   RBC / HPF 01/08/2021 0-5  0 - 5 RBC/hpf Final   WBC, UA 01/08/2021 0-5  0 - 5 WBC/hpf Final   Bacteria, UA 01/08/2021 NONE SEEN  NONE SEEN Final   Performed at [facility], [address], [place], [place] [address]   Glucose-Capillary 01/08/2021 310 (H)  70 - 99 mg/dL Final   Glucose reference range applies only to samples taken after fasting for at least 8 hours.   Lithium Lvl 01/08/2021 <0.06 (L)  0.60 - 1.20 mmol/L Final   Performed at [facility], [address], [place], [place] [address]   Glucose-Capillary 01/08/2021 387 (H)  70 - 99 mg/dL Final   Glucose reference range applies only to samples taken after fasting for at least 8 hours.    Allergies: Patient has no known allergies.  PTA Medications: (Not in a hospital admission)   Medical Decision Making  Patient continues to meet inpatient psychiatric admission for stabilization.  Patient will be admitted to [facility] while waiting for inpatient psychiatric bed to become available. -continue home medications -blood sugar checks ACHS, sliding scale insulin, and  scheduled insulin -reviewed available lab results -monitor for safety.    Recommendations  Based on my evaluation the patient does not appear to have an emergency medical condition.  Maricela Bo, NP 04/20/21  1:18 AM

## 2021-04-20 NOTE — Progress Notes (Signed)
Pt's CBG recheck was 236. No distress noted. Staff will monitor for pt's safety.

## 2021-04-20 NOTE — ED Notes (Signed)
Pt messing with the cords to the water machine again today. He turned the machine off. Pt denying that he was touching the machine when staff viewed him as she walked from the nurses station. Pt redirected and asked to leave the machine alone and to not mess with anymore. Pt continued to deny that he was touching the machine. Safety maintained and will continue to monitor.

## 2021-04-20 NOTE — ED Notes (Signed)
Pt has been sitting watching TV without incident.   Will continue to monitor for safety.

## 2021-04-20 NOTE — ED Provider Notes (Signed)
Behavioral Health Progress Note  Date and Time: 04/20/2021 8:46 AM Name: Mitchell [name] MRN:  [id number]  Subjective: Patient states "I can stay here or I can go home, I am fine either way." Attending RN reports patient disabled ice and water machine earlier today by removing parts.  Attempted to discuss with patient regarding ice/water machine he states "why do you keep accusing me of that, why is everyone accusing me, do not say that again!"  Cepeda raises his voice when discussing situation. Patient presents with anxious mood, labile affect.  Patient appears paranoid.  Haroun is reassessed by nurse practitioner, face-to-face. Patient is seated in observation area, no acute distress.  He remains alert and oriented.  He is cooperative with assessment.  He continues to deny auditory and visual hallucinations.  There is no indication that patient is responding to internal stimuli at this time.  Daryon continues to deny suicidal and homicidal ideations.  Patient offered support and encouragement.  Discussed treatment plan, to include inpatient psychiatric hospitalization.  Patient verbalizes agreement with current treatment plan.    Diagnosis:  Final diagnoses:  Schizoaffective disorder, bipolar type ([place])    Total Time spent with patient: 30 minutes  Past Psychiatric History: bipolar affective disorder, schizoaffective disorder Past Medical History:  Past Medical History:  Diagnosis Date   Bipolar affective disorder ([place])    takes Zyprexa daily   Diabetes mellitus    takes Victoza,Metformin,and Glipizide daily   Hypertension    takes Amlodipine,Lisinopril and Clonidine daily   Hyponatremia    history of   Mental disorder    takes Lithium daily   Schizoaffective disorder    takes Trazodone nightly   Seasonal allergies    takes Claritin daily   Sleep apnea    sleep study >66yrs ago   Stroke [facility])    left arm weakness    Past Surgical History:  Procedure Laterality Date    CATARACT EXTRACTION W/PHACO Right 02/14/2013   Procedure: CATARACT EXTRACTION PHACO AND INTRAOCULAR LENS PLACEMENT ([place]);  Surgeon: Adonis Brook, MD;  Location: [place];  Service: Ophthalmology;  Laterality: Right;   CATARACT EXTRACTION W/PHACO Left 06/13/2013   Procedure: CATARACT EXTRACTION PHACO AND INTRAOCULAR LENS PLACEMENT ([facility]);  Surgeon: Adonis Brook, MD;  Location: [place];  Service: Ophthalmology;  Laterality: Left;   CIRCUMCISION  20 yrs. ago   EYE SURGERY     Family History: No family history on file. Family Psychiatric  History: none reported Social History:  Social History   Substance and Sexual Activity  Alcohol Use Not Currently     Social History   Substance and Sexual Activity  Drug Use No    Social History   Socioeconomic History   Marital status: Divorced    Spouse name: Not on file   Number of children: Not on file   Years of education: Not on file   Highest education level: Not on file  Occupational History   Not on file  Tobacco Use   Smoking status: Never   Smokeless tobacco: Never  Vaping Use   Vaping Use: Never used  Substance and Sexual Activity   Alcohol use: Not Currently   Drug use: No   Sexual activity: Yes    Birth control/protection: None  Other Topics Concern   Not on file  Social History Narrative   Not on file   Social Determinants of Health   Financial Resource Strain: Not on file  Food Insecurity: Not on file  Transportation Needs: Not  on file  Physical Activity: Not on file  Stress: Not on file  Social Connections: Not on file   SDOH:  SDOH Screenings   Alcohol Screen: Low Risk    Last Alcohol Screening Score (AUDIT): 0  Depression (PHQ2-9): Low Risk    PHQ-2 Score: 0  Financial Resource Strain: Not on file  Food Insecurity: Not on file  Housing: Not on file  Physical Activity: Not on file  Social Connections: Not on file  Stress: Not on file  Tobacco Use: Low Risk    Smoking Tobacco Use: Never   Smokeless Tobacco  Use: Never   Passive Exposure: Not on file  Transportation Needs: Not on file   Additional Social History:                         Sleep: Fair  Appetite:  Good  Current Medications:  Current Facility-Administered Medications  Medication Dose Route Frequency Provider Last Rate Last Admin   acetaminophen (TYLENOL) tablet 650 mg  650 mg Oral Q6H PRN Ajibola, Ene A, NP       alum & mag hydroxide-simeth (MAALOX/MYLANTA) 200-200-20 MG/5ML suspension 30 mL  30 mL Oral Q4H PRN Ajibola, Ene A, NP       amLODipine (NORVASC) tablet 10 mg  10 mg Oral Daily Ajibola, Ene A, NP       atenolol (TENORMIN) tablet 12.5 mg  12.5 mg Oral Daily Ajibola, Ene A, NP       benztropine (COGENTIN) tablet 1 mg  1 mg Oral BID Ajibola, Ene A, NP       cloNIDine (CATAPRES) tablet 0.2 mg  0.2 mg Oral BID Ajibola, Ene A, NP       cloZAPine (CLOZARIL) tablet 50 mg  50 mg Oral BID Ajibola, Ene A, NP       haloperidol (HALDOL) tablet 20 mg  20 mg Oral QHS Ajibola, Ene A, NP       hydrOXYzine (ATARAX) tablet 25 mg  25 mg Oral TID PRN Ajibola, Ene A, NP       insulin aspart (novoLOG) injection 0-9 Units  0-9 Units Subcutaneous TID WC Ajibola, Ene A, NP       insulin aspart protamine- aspart (NOVOLOG MIX 70/30) injection 42 Units  42 Units Subcutaneous Q supper Ajibola, Ene A, NP       lisinopril (ZESTRIL) tablet 40 mg  40 mg Oral Daily Ajibola, Ene A, NP       lithium carbonate (ESKALITH) CR tablet 450 mg  450 mg Oral Q12H Ajibola, Ene A, NP       magnesium hydroxide (MILK OF MAGNESIA) suspension 30 mL  30 mL Oral Daily PRN Ajibola, Ene A, NP       traZODone (DESYREL) tablet 50 mg  50 mg Oral QHS PRN Ajibola, Ene A, NP       traZODone (DESYREL) tablet 50 mg  50 mg Oral QHS Ajibola, Ene A, NP       Current Outpatient Medications  Medication Sig Dispense Refill   amLODipine (NORVASC) 10 MG tablet Take 1 tablet (10 mg total) by mouth daily. (Patient not taking: Reported on 04/16/2021) 90 tablet 1   aspirin 81 MG EC  tablet Take 1 tablet (81 mg total) by mouth daily. Swallow whole. (Patient not taking: Reported on 04/16/2021) 30 tablet 12   atenolol (TENORMIN) 25 MG tablet Take 0.5 tablets (12.5 mg total) by mouth daily. (Patient not taking: Reported on 04/16/2021) 15 tablet  0   benztropine (COGENTIN) 1 MG tablet Take 1 tablet (1 mg total) by mouth 2 (two) times daily. 60 tablet 2   cloNIDine (CATAPRES) 0.2 MG tablet Take 0.2 mg by mouth 2 (two) times daily. (Patient not taking: Reported on 01/26/2021)     cloZAPine (CLOZARIL) 50 MG tablet Take 1 tablet (50 mg total) by mouth 2 (two) times daily. 60 tablet 0   glipiZIDE (GLUCOTROL XL) 10 MG 24 hr tablet Take 2 tablets (20 mg total) by mouth daily with breakfast. 60 tablet 0   haloperidol (HALDOL) 20 MG tablet Take 1 tablet (20 mg total) by mouth at bedtime. (Patient not taking: Reported on 01/26/2021) 90 tablet 1   insulin aspart protamine - aspart (NOVOLOG MIX 70/30 FLEXPEN) (70-30) 100 UNIT/ML FlexPen Inject 48 Units into the skin daily with breakfast. 15 mL 0   insulin aspart protamine - aspart (NOVOLOG MIX 70/30 FLEXPEN) (70-30) 100 UNIT/ML FlexPen Inject 42 Units into the skin daily with supper. 15 mL 0   lisinopril (ZESTRIL) 40 MG tablet Take 1 tablet (40 mg total) by mouth daily. (Patient not taking: Reported on 01/11/2020) 90 tablet 1   lithium carbonate (ESKALITH) 450 MG CR tablet Take 1 tablet (450 mg total) by mouth every 12 (twelve) hours. (Patient not taking: Reported on 01/26/2021) 60 tablet 2   traZODone (DESYREL) 50 MG tablet Take 1 tablet (50 mg total) by mouth at bedtime. (Patient not taking: Reported on 04/16/2021) 30 tablet 0    Labs  Lab Results:  Admission on 04/19/2021  Component Date Value Ref Range Status   Glucose-Capillary 04/20/2021 79  70 - 99 mg/dL Final   Glucose reference range applies only to samples taken after fasting for at least 8 hours.  Admission on 04/16/2021, Discharged on 04/19/2021  Component Date Value Ref Range  Status   Sodium 04/16/2021 128 (L)  135 - 145 mmol/L Final   Potassium 04/16/2021 5.0  3.5 - 5.1 mmol/L Final   Chloride 04/16/2021 95 (L)  98 - 111 mmol/L Final   CO2 04/16/2021 22  22 - 32 mmol/L Final   Glucose, Bld 04/16/2021 492 (H)  70 - 99 mg/dL Final   Glucose reference range applies only to samples taken after fasting for at least 8 hours.   BUN 04/16/2021 19  6 - 20 mg/dL Final   Creatinine, Ser 04/16/2021 1.61 (H)  0.61 - 1.24 mg/dL Final   Calcium 17/00/1749 9.0  8.9 - 10.3 mg/dL Final   Total Protein 44/96/7591 7.3  6.5 - 8.1 g/dL Final   Albumin 63/84/6659 4.0  3.5 - 5.0 g/dL Final   AST 93/57/0177 28  15 - 41 U/L Final   ALT 04/16/2021 21  0 - 44 U/L Final   Alkaline Phosphatase 04/16/2021 110  38 - 126 U/L Final   Total Bilirubin 04/16/2021 0.9  0.3 - 1.2 mg/dL Final   GFR, Estimated 04/16/2021 49 (L)  >60 mL/min Final   Comment: (NOTE) Calculated using the CKD-EPI Creatinine Equation (2021)    Anion gap 04/16/2021 11  5 - 15 Final   Performed at [facility], [address] [place], [place] [address]   Alcohol, Ethyl (B) 04/16/2021 <10  <10 mg/dL Final   Comment: (NOTE) Lowest detectable limit for serum alcohol is 10 mg/dL.  For medical purposes only. Performed at [facility], [address] [place], [place] [address]    WBC 04/16/2021 6.0  4.0 - 10.5 K/uL Final  RBC 04/16/2021 4.31  4.22 - 5.81 MIL/uL Final   Hemoglobin 04/16/2021 13.3  13.0 - 17.0 g/dL Final   HCT 04/16/2021 40.0  39.0 - 52.0 % Final   MCV 04/16/2021 92.8  80.0 - 100.0 fL Final   MCH 04/16/2021 30.9  26.0 - 34.0 pg Final   MCHC 04/16/2021 33.3  30.0 - 36.0 g/dL Final   RDW 04/16/2021 12.7  11.5 - 15.5 % Final   Platelets 04/16/2021 232  150 - 400 K/uL Final   nRBC 04/16/2021 0.0  0.0 - 0.2 % Final   Performed at [facility], [place] [address] [place], [place] [address]   Opiates 04/16/2021 NONE DETECTED  NONE DETECTED Final   Cocaine 04/16/2021 NONE DETECTED  NONE  DETECTED Final   Benzodiazepines 04/16/2021 NONE DETECTED  NONE DETECTED Final   Amphetamines 04/16/2021 NONE DETECTED  NONE DETECTED Final   Tetrahydrocannabinol 04/16/2021 NONE DETECTED  NONE DETECTED Final   Barbiturates 04/16/2021 NONE DETECTED  NONE DETECTED Final   Comment: (NOTE) DRUG SCREEN FOR MEDICAL PURPOSES ONLY.  IF CONFIRMATION IS NEEDED FOR ANY PURPOSE, NOTIFY LAB WITHIN 5 DAYS.  LOWEST DETECTABLE LIMITS FOR URINE DRUG SCREEN Drug Class                     Cutoff (ng/mL) Amphetamine and metabolites    1000 Barbiturate and metabolites    200 Benzodiazepine                 [id number] Tricyclics and metabolites     300 Opiates and metabolites        300 Cocaine and metabolites        300 THC                            50 Performed at [facility], [place] [address] [place], [place] [address]    Glucose-Capillary 04/16/2021 486 (H)  70 - 99 mg/dL Final   Glucose reference range applies only to samples taken after fasting for at least 8 hours.   Lithium Lvl 04/16/2021 0.32 (L)  0.60 - 1.20 mmol/L Final   Performed at [facility] [address], [place], [place] [address]   SARS Coronavirus 2 by RT PCR 04/16/2021 NEGATIVE  NEGATIVE Final   Comment: (NOTE) SARS-CoV-2 target nucleic acids are NOT DETECTED.  The SARS-CoV-2 RNA is generally detectable in upper respiratory specimens during the acute phase of infection. The lowest concentration of SARS-CoV-2 viral copies this assay can detect is 138 copies/mL. A negative result does not preclude SARS-Cov-2 infection and should not be used as the sole basis for treatment or other patient management decisions. A negative result may occur with  improper specimen collection/handling, submission of specimen other than nasopharyngeal swab, presence of viral mutation(s) within the areas targeted by this assay, and inadequate number of viral copies(<138 copies/mL). A negative result must be combined with clinical  observations, patient history, and epidemiological information. The expected result is Negative.  Fact Sheet for Patients:  EntrepreneurPulse.com.au  Fact Sheet for Healthcare Providers:  IncredibleEmployment.be  This test is no                          t yet approved or cleared by the [place] FDA and  has been authorized for detection and/or diagnosis of SARS-CoV-2 by FDA under an Emergency Use Authorization (EUA). This EUA  will remain  in effect (meaning this test can be used) for the duration of the COVID-19 declaration under Section 564(b)(1) of the Act, 21 U.S.C.section 360bbb-3(b)(1), unless the authorization is terminated  or revoked sooner.       Influenza A by PCR 04/16/2021 NEGATIVE  NEGATIVE Final   Influenza B by PCR 04/16/2021 NEGATIVE  NEGATIVE Final   Comment: (NOTE) The Xpert Xpress SARS-CoV-2/FLU/RSV plus assay is intended as an aid in the diagnosis of influenza from Nasopharyngeal swab specimens and should not be used as a sole basis for treatment. Nasal washings and aspirates are unacceptable for Xpert Xpress SARS-CoV-2/FLU/RSV testing.  Fact Sheet for Patients: EntrepreneurPulse.com.au  Fact Sheet for Healthcare Providers: IncredibleEmployment.be  This test is not yet approved or cleared by the [place] FDA and has been authorized for detection and/or diagnosis of SARS-CoV-2 by FDA under an Emergency Use Authorization (EUA). This EUA will remain in effect (meaning this test can be used) for the duration of the COVID-19 declaration under Section 564(b)(1) of the Act, 21 U.S.C. section 360bbb-3(b)(1), unless the authorization is terminated or revoked.  Performed at [facility], [place] [address], [place], [place] [address]    Glucose-Capillary 04/16/2021 451 (H)  70 - 99 mg/dL Final   Glucose reference range applies only to samples taken after fasting for at least 8  hours.   Glucose-Capillary 04/16/2021 373 (H)  70 - 99 mg/dL Final   Glucose reference range applies only to samples taken after fasting for at least 8 hours.   WBC 04/16/2021 5.8  4.0 - 10.5 K/uL Final   RBC 04/16/2021 4.07 (L)  4.22 - 5.81 MIL/uL Final   Hemoglobin 04/16/2021 12.8 (L)  13.0 - 17.0 g/dL Final   HCT 04/16/2021 37.1 (L)  39.0 - 52.0 % Final   MCV 04/16/2021 91.2  80.0 - 100.0 fL Final   MCH 04/16/2021 31.4  26.0 - 34.0 pg Final   MCHC 04/16/2021 34.5  30.0 - 36.0 g/dL Final   RDW 04/16/2021 12.8  11.5 - 15.5 % Final   Platelets 04/16/2021 213  150 - 400 K/uL Final   nRBC 04/16/2021 0.0  0.0 - 0.2 % Final   Neutrophils Relative % 04/16/2021 45  % Final   Neutro Abs 04/16/2021 2.6  1.7 - 7.7 K/uL Final   Lymphocytes Relative 04/16/2021 43  % Final   Lymphs Abs 04/16/2021 2.5  0.7 - 4.0 K/uL Final   Monocytes Relative 04/16/2021 8  % Final   Monocytes Absolute 04/16/2021 0.5  0.1 - 1.0 K/uL Final   Eosinophils Relative 04/16/2021 3  % Final   Eosinophils Absolute 04/16/2021 0.2  0.0 - 0.5 K/uL Final   Basophils Relative 04/16/2021 1  % Final   Basophils Absolute 04/16/2021 0.0  0.0 - 0.1 K/uL Final   Immature Granulocytes 04/16/2021 0  % Final   Abs Immature Granulocytes 04/16/2021 0.01  0.00 - 0.07 K/uL Final   Performed at [facility], [place] [address], [place], [place] [address]   Glucose-Capillary 04/16/2021 270 (H)  70 - 99 mg/dL Final   Glucose reference range applies only to samples taken after fasting for at least 8 hours.   Glucose-Capillary 04/17/2021 410 (H)  70 - 99 mg/dL Final   Glucose reference range applies only to samples taken after fasting for at least 8 hours.   Glucose-Capillary 04/17/2021 381 (H)  70 - 99 mg/dL Final   Glucose reference range applies only to samples taken after  fasting for at least 8 hours.   Glucose-Capillary 04/17/2021 392 (H)  70 - 99 mg/dL Final   Glucose reference range applies only to samples taken after fasting for at  least 8 hours.   Glucose-Capillary 04/17/2021 211 (H)  70 - 99 mg/dL Final   Glucose reference range applies only to samples taken after fasting for at least 8 hours.   Glucose-Capillary 04/18/2021 69 (L)  70 - 99 mg/dL Final   Glucose reference range applies only to samples taken after fasting for at least 8 hours.   Glucose-Capillary 04/18/2021 100 (H)  70 - 99 mg/dL Final   Glucose reference range applies only to samples taken after fasting for at least 8 hours.   Glucose-Capillary 04/18/2021 127 (H)  70 - 99 mg/dL Final   Glucose reference range applies only to samples taken after fasting for at least 8 hours.   Glucose-Capillary 04/18/2021 67 (L)  70 - 99 mg/dL Final   Glucose reference range applies only to samples taken after fasting for at least 8 hours.   Glucose-Capillary 04/18/2021 186 (H)  70 - 99 mg/dL Final   Glucose reference range applies only to samples taken after fasting for at least 8 hours.   Glucose-Capillary 04/18/2021 56 (L)  70 - 99 mg/dL Final   Glucose reference range applies only to samples taken after fasting for at least 8 hours.   Glucose-Capillary 04/18/2021 142 (H)  70 - 99 mg/dL Final   Glucose reference range applies only to samples taken after fasting for at least 8 hours.   Glucose-Capillary 04/19/2021 82  70 - 99 mg/dL Final   Glucose reference range applies only to samples taken after fasting for at least 8 hours.   Glucose-Capillary 04/19/2021 69 (L)  70 - 99 mg/dL Final   Glucose reference range applies only to samples taken after fasting for at least 8 hours.   Glucose-Capillary 04/19/2021 210 (H)  70 - 99 mg/dL Final   Glucose reference range applies only to samples taken after fasting for at least 8 hours.   Glucose-Capillary 04/19/2021 243 (H)  70 - 99 mg/dL Final   Glucose reference range applies only to samples taken after fasting for at least 8 hours.   Glucose-Capillary 04/19/2021 200 (H)  70 - 99 mg/dL Final   Glucose reference range applies  only to samples taken after fasting for at least 8 hours.  Admission on 04/16/2021, Discharged on 04/16/2021  Component Date Value Ref Range Status   SARS Coronavirus 2 by RT PCR 04/16/2021 NEGATIVE  NEGATIVE Final   Comment: (NOTE) SARS-CoV-2 target nucleic acids are NOT DETECTED.  The SARS-CoV-2 RNA is generally detectable in upper respiratory specimens during the acute phase of infection. The lowest concentration of SARS-CoV-2 viral copies this assay can detect is 138 copies/mL. A negative result does not preclude SARS-Cov-2 infection and should not be used as the sole basis for treatment or other patient management decisions. A negative result may occur with  improper specimen collection/handling, submission of specimen other than nasopharyngeal swab, presence of viral mutation(s) within the areas targeted by this assay, and inadequate number of viral copies(<138 copies/mL). A negative result must be combined with clinical observations, patient history, and epidemiological information. The expected result is Negative.  Fact Sheet for Patients:  EntrepreneurPulse.com.au  Fact Sheet for Healthcare Providers:  IncredibleEmployment.be  This test is no  t yet approved or cleared by the [place] and  has been authorized for detection and/or diagnosis of SARS-CoV-2 by FDA under an Emergency Use Authorization (EUA). This EUA will remain  in effect (meaning this test can be used) for the duration of the COVID-19 declaration under Section 564(b)(1) of the Act, 21 U.S.C.section 360bbb-3(b)(1), unless the authorization is terminated  or revoked sooner.       Influenza A by PCR 04/16/2021 NEGATIVE  NEGATIVE Final   Influenza B by PCR 04/16/2021 NEGATIVE  NEGATIVE Final   Comment: (NOTE) The Xpert Xpress SARS-CoV-2/FLU/RSV plus assay is intended as an aid in the diagnosis of influenza from Nasopharyngeal swab specimens  and should not be used as a sole basis for treatment. Nasal washings and aspirates are unacceptable for Xpert Xpress SARS-CoV-2/FLU/RSV testing.  Fact Sheet for Patients: EntrepreneurPulse.com.au  Fact Sheet for Healthcare Providers: IncredibleEmployment.be  This test is not yet approved or cleared by the [place] FDA and has been authorized for detection and/or diagnosis of SARS-CoV-2 by FDA under an Emergency Use Authorization (EUA). This EUA will remain in effect (meaning this test can be used) for the duration of the COVID-19 declaration under Section 564(b)(1) of the Act, 21 U.S.C. section 360bbb-3(b)(1), unless the authorization is terminated or revoked.  Performed at [facility], [place] [address], [place], [place] [address]    POC Amphetamine UR 04/16/2021 None Detected  NONE DETECTED (Cut Off Level 1000 ng/mL) Final   POC Secobarbital (BAR) 04/16/2021 None Detected  NONE DETECTED (Cut Off Level 300 ng/mL) Final   POC Buprenorphine (BUP) 04/16/2021 None Detected  NONE DETECTED (Cut Off Level 10 ng/mL) Final   POC Oxazepam (BZO) 04/16/2021 None Detected  NONE DETECTED (Cut Off Level 300 ng/mL) Final   POC Cocaine UR 04/16/2021 None Detected  NONE DETECTED (Cut Off Level 300 ng/mL) Final   POC Methamphetamine UR 04/16/2021 None Detected  NONE DETECTED (Cut Off Level 1000 ng/mL) Final   POC Morphine 04/16/2021 None Detected  NONE DETECTED (Cut Off Level 300 ng/mL) Final   POC Oxycodone UR 04/16/2021 None Detected  NONE DETECTED (Cut Off Level 100 ng/mL) Final   POC Methadone UR 04/16/2021 None Detected  NONE DETECTED (Cut Off Level 300 ng/mL) Final   POC Marijuana UR 04/16/2021 None Detected  NONE DETECTED (Cut Off Level 50 ng/mL) Final   SARS Coronavirus 2 Ag 04/16/2021 Negative  Negative Final  No results displayed because visit has over 200 results.    Admission on 01/26/2021, Discharged on 01/28/2021  Component Date Value Ref  Range Status   SARS Coronavirus 2 by RT PCR 01/26/2021 NEGATIVE  NEGATIVE Final   Comment: (NOTE) SARS-CoV-2 target nucleic acids are NOT DETECTED.  The SARS-CoV-2 RNA is generally detectable in upper respiratory specimens during the acute phase of infection. The lowest concentration of SARS-CoV-2 viral copies this assay can detect is 138 copies/mL. A negative result does not preclude SARS-Cov-2 infection and should not be used as the sole basis for treatment or other patient management decisions. A negative result may occur with  improper specimen collection/handling, submission of specimen other than nasopharyngeal swab, presence of viral mutation(s) within the areas targeted by this assay, and inadequate number of viral copies(<138 copies/mL). A negative result must be combined with clinical observations, patient history, and epidemiological information. The expected result is Negative.  Fact Sheet for Patients:  EntrepreneurPulse.com.au  Fact Sheet for Healthcare Providers:  IncredibleEmployment.be  This test is no  t yet approved or cleared by the [place] and  has been authorized for detection and/or diagnosis of SARS-CoV-2 by FDA under an Emergency Use Authorization (EUA). This EUA will remain  in effect (meaning this test can be used) for the duration of the COVID-19 declaration under Section 564(b)(1) of the Act, 21 U.S.C.section 360bbb-3(b)(1), unless the authorization is terminated  or revoked sooner.       Influenza A by PCR 01/26/2021 NEGATIVE  NEGATIVE Final   Influenza B by PCR 01/26/2021 NEGATIVE  NEGATIVE Final   Comment: (NOTE) The Xpert Xpress SARS-CoV-2/FLU/RSV plus assay is intended as an aid in the diagnosis of influenza from Nasopharyngeal swab specimens and should not be used as a sole basis for treatment. Nasal washings and aspirates are unacceptable for Xpert Xpress  SARS-CoV-2/FLU/RSV testing.  Fact Sheet for Patients: EntrepreneurPulse.com.au  Fact Sheet for Healthcare Providers: IncredibleEmployment.be  This test is not yet approved or cleared by the [place] FDA and has been authorized for detection and/or diagnosis of SARS-CoV-2 by FDA under an Emergency Use Authorization (EUA). This EUA will remain in effect (meaning this test can be used) for the duration of the COVID-19 declaration under Section 564(b)(1) of the Act, 21 U.S.C. section 360bbb-3(b)(1), unless the authorization is terminated or revoked.  Performed at [facility], [place] [address], [place], [place] [address]    Sodium 01/26/2021 137  135 - 145 mmol/L Final   Potassium 01/26/2021 4.0  3.5 - 5.1 mmol/L Final   Chloride 01/26/2021 101  98 - 111 mmol/L Final   CO2 01/26/2021 25  22 - 32 mmol/L Final   Glucose, Bld 01/26/2021 269 (H)  70 - 99 mg/dL Final   Glucose reference range applies only to samples taken after fasting for at least 8 hours.   BUN 01/26/2021 13  6 - 20 mg/dL Final   Creatinine, Ser 01/26/2021 1.45 (H)  0.61 - 1.24 mg/dL Final   Calcium 01/26/2021 9.2  8.9 - 10.3 mg/dL Final   Total Protein 01/26/2021 7.9  6.5 - 8.1 g/dL Final   Albumin 01/26/2021 4.3  3.5 - 5.0 g/dL Final   AST 01/26/2021 30  15 - 41 U/L Final   ALT 01/26/2021 26  0 - 44 U/L Final   Alkaline Phosphatase 01/26/2021 105  38 - 126 U/L Final   Total Bilirubin 01/26/2021 1.4 (H)  0.3 - 1.2 mg/dL Final   GFR, Estimated 01/26/2021 56 (L)  >60 mL/min Final   Comment: (NOTE) Calculated using the CKD-EPI Creatinine Equation (2021)    Anion gap 01/26/2021 11  5 - 15 Final   Performed at [facility] [address] [place], [place] [address]   Alcohol, Ethyl (B) 01/26/2021 <10  <10 mg/dL Final   Comment: (NOTE) Lowest detectable limit for serum alcohol is 10 mg/dL.  For medical purposes only. Performed at [facility], [place] [address], [place], [place] [address]    WBC 01/26/2021 7.1  4.0 - 10.5 K/uL Final   RBC 01/26/2021 4.76  4.22 - 5.81 MIL/uL Final   Hemoglobin 01/26/2021 14.7  13.0 - 17.0 g/dL Final   HCT 01/26/2021 45.2  39.0 - 52.0 % Final   MCV 01/26/2021 95.0  80.0 - 100.0 fL Final   MCH 01/26/2021 30.9  26.0 - 34.0 pg Final   MCHC 01/26/2021 32.5  30.0 - 36.0 g/dL Final   RDW 01/26/2021 12.8  11.5 - 15.5 % Final   Platelets 01/26/2021  277  150 - 400 K/uL Final   nRBC 01/26/2021 0.0  0.0 - 0.2 % Final   Neutrophils Relative % 01/26/2021 46  % Final   Neutro Abs 01/26/2021 3.3  1.7 - 7.7 K/uL Final   Lymphocytes Relative 01/26/2021 39  % Final   Lymphs Abs 01/26/2021 2.7  0.7 - 4.0 K/uL Final   Monocytes Relative 01/26/2021 10  % Final   Monocytes Absolute 01/26/2021 0.7  0.1 - 1.0 K/uL Final   Eosinophils Relative 01/26/2021 4  % Final   Eosinophils Absolute 01/26/2021 0.3  0.0 - 0.5 K/uL Final   Basophils Relative 01/26/2021 1  % Final   Basophils Absolute 01/26/2021 0.1  0.0 - 0.1 K/uL Final   Immature Granulocytes 01/26/2021 0  % Final   Abs Immature Granulocytes 01/26/2021 0.01  0.00 - 0.07 K/uL Final   Performed at [facility], [place] [address], [place], [place] [address]   Hgb A1c MFr Bld 01/26/2021 11.1 (H)  4.8 - 5.6 % Final   Comment: (NOTE) Pre diabetes:          5.7%-6.4%  Diabetes:              >6.4%  Glycemic control for   <7.0% adults with diabetes    Mean Plasma Glucose 01/26/2021 271.87  mg/dL Final   Performed at [facility], [place] [address], [place], [place] [address]   Glucose-Capillary 01/26/2021 381 (H)  70 - 99 mg/dL Final   Glucose reference range applies only to samples taken after fasting for at least 8 hours.   Lithium Lvl 01/27/2021 0.08 (L)  0.60 - 1.20 mmol/L Final   Performed at [facility], [place] [address] [place], [place] [address]   Glucose-Capillary 01/27/2021 400 (H)  70 - 99 mg/dL Final   Glucose reference range applies only to samples taken  after fasting for at least 8 hours.   Glucose-Capillary 01/27/2021 308 (H)  70 - 99 mg/dL Final   Glucose reference range applies only to samples taken after fasting for at least 8 hours.   Glucose-Capillary 01/27/2021 113 (H)  70 - 99 mg/dL Final   Glucose reference range applies only to samples taken after fasting for at least 8 hours.   Glucose-Capillary 01/27/2021 306 (H)  70 - 99 mg/dL Final   Glucose reference range applies only to samples taken after fasting for at least 8 hours.   Glucose-Capillary 01/27/2021 191 (H)  70 - 99 mg/dL Final   Glucose reference range applies only to samples taken after fasting for at least 8 hours.   Glucose-Capillary 01/28/2021 246 (H)  70 - 99 mg/dL Final   Glucose reference range applies only to samples taken after fasting for at least 8 hours.   SARSCOV2ONAVIRUS 2 AG 01/28/2021 NEGATIVE  NEGATIVE Final   Comment: (NOTE) SARS-CoV-2 antigen NOT DETECTED.   Negative results are presumptive.  Negative results do not preclude SARS-CoV-2 infection and should not be used as the sole basis for treatment or other patient management decisions, including infection  control decisions, particularly in the presence of clinical signs and  symptoms consistent with COVID-19, or in those who have been in contact with the virus.  Negative results must be combined with clinical observations, patient history, and epidemiological information. The expected result is Negative.  Fact Sheet for Patients: HandmadeRecipes.com.cy  Fact Sheet for Healthcare Providers: FuneralLife.at  This test is not yet approved or cleared by the [place] and  has been authorized for detection and/or diagnosis of SARS-CoV-2 by FDA under an Emergency Use Authorization (EUA).  This EUA will remain in effect (meaning this test can be used) for the duration of  the COV                          ID-19 declaration under Section 564(b)(1)  of the Act, 21 U.S.C. section 360bbb-3(b)(1), unless the authorization is terminated or revoked sooner.     Glucose-Capillary 01/28/2021 281 (H)  70 - 99 mg/dL Final   Glucose reference range applies only to samples taken after fasting for at least 8 hours.  Admission on 01/21/2021, Discharged on 01/21/2021  Component Date Value Ref Range Status   Sodium 01/21/2021 134 (L)  135 - 145 mmol/L Final   Potassium 01/21/2021 4.1  3.5 - 5.1 mmol/L Final   Chloride 01/21/2021 97 (L)  98 - 111 mmol/L Final   CO2 01/21/2021 27  22 - 32 mmol/L Final   Glucose, Bld 01/21/2021 316 (H)  70 - 99 mg/dL Final   Glucose reference range applies only to samples taken after fasting for at least 8 hours.   BUN 01/21/2021 17  6 - 20 mg/dL Final   Creatinine, Ser 01/21/2021 1.39 (H)  0.61 - 1.24 mg/dL Final   Calcium 01/21/2021 9.5  8.9 - 10.3 mg/dL Final   Total Protein 01/21/2021 7.7  6.5 - 8.1 g/dL Final   Albumin 01/21/2021 4.0  3.5 - 5.0 g/dL Final   AST 01/21/2021 22  15 - 41 U/L Final   ALT 01/21/2021 24  0 - 44 U/L Final   Alkaline Phosphatase 01/21/2021 96  38 - 126 U/L Final   Total Bilirubin 01/21/2021 1.0  0.3 - 1.2 mg/dL Final   GFR, Estimated 01/21/2021 58 (L)  >60 mL/min Final   Comment: (NOTE) Calculated using the CKD-EPI Creatinine Equation (2021)    Anion gap 01/21/2021 10  5 - 15 Final   Performed at [facility] [address], [place], [place] [address]   Alcohol, Ethyl (B) 01/21/2021 <10  <10 mg/dL Final   Comment: (NOTE) Lowest detectable limit for serum alcohol is 10 mg/dL.  For medical purposes only. Performed at [facility], [place] [address] [place], [place] [address]    Opiates 01/21/2021 NONE DETECTED  NONE DETECTED Final   Cocaine 01/21/2021 NONE DETECTED  NONE DETECTED Final   Benzodiazepines 01/21/2021 NONE DETECTED  NONE DETECTED Final   Amphetamines 01/21/2021 NONE DETECTED  NONE DETECTED Final   Tetrahydrocannabinol 01/21/2021 NONE DETECTED  NONE  DETECTED Final   Barbiturates 01/21/2021 NONE DETECTED  NONE DETECTED Final   Comment: (NOTE) DRUG SCREEN FOR MEDICAL PURPOSES ONLY.  IF CONFIRMATION IS NEEDED FOR ANY PURPOSE, NOTIFY LAB WITHIN 5 DAYS.  LOWEST DETECTABLE LIMITS FOR URINE DRUG SCREEN Drug Class                     Cutoff (ng/mL) Amphetamine and metabolites    1000 Barbiturate and metabolites    200 Benzodiazepine                 [id number] Tricyclics and metabolites     300 Opiates and metabolites        300 Cocaine and metabolites        300 THC  50 Performed at [facility], [place] [address] [place], [place] [address]    WBC 01/21/2021 5.9  4.0 - 10.5 K/uL Final   RBC 01/21/2021 4.93  4.22 - 5.81 MIL/uL Final   Hemoglobin 01/21/2021 15.4  13.0 - 17.0 g/dL Final   HCT 01/21/2021 45.0  39.0 - 52.0 % Final   MCV 01/21/2021 91.3  80.0 - 100.0 fL Final   MCH 01/21/2021 31.2  26.0 - 34.0 pg Final   MCHC 01/21/2021 34.2  30.0 - 36.0 g/dL Final   RDW 01/21/2021 13.0  11.5 - 15.5 % Final   Platelets 01/21/2021 271  150 - 400 K/uL Final   nRBC 01/21/2021 0.0  0.0 - 0.2 % Final   Neutrophils Relative % 01/21/2021 54  % Final   Neutro Abs 01/21/2021 3.2  1.7 - 7.7 K/uL Final   Lymphocytes Relative 01/21/2021 32  % Final   Lymphs Abs 01/21/2021 1.9  0.7 - 4.0 K/uL Final   Monocytes Relative 01/21/2021 9  % Final   Monocytes Absolute 01/21/2021 0.6  0.1 - 1.0 K/uL Final   Eosinophils Relative 01/21/2021 4  % Final   Eosinophils Absolute 01/21/2021 0.3  0.0 - 0.5 K/uL Final   Basophils Relative 01/21/2021 1  % Final   Basophils Absolute 01/21/2021 0.0  0.0 - 0.1 K/uL Final   Immature Granulocytes 01/21/2021 0  % Final   Abs Immature Granulocytes 01/21/2021 0.01  0.00 - 0.07 K/uL Final   Performed at [facility], [place] [address], [place], [place] [address]   Lithium Lvl 01/21/2021 0.47 (L)  0.60 - 1.20 mmol/L Final   Performed at [facility] [address], [place], [place]  [address]  Admission on 01/08/2021, Discharged on 01/08/2021  Component Date Value Ref Range Status   Glucose-Capillary 01/08/2021 405 (H)  70 - 99 mg/dL Final   Glucose reference range applies only to samples taken after fasting for at least 8 hours.   Sodium 01/08/2021 134 (L)  135 - 145 mmol/L Final   Potassium 01/08/2021 3.6  3.5 - 5.1 mmol/L Final   Chloride 01/08/2021 101  98 - 111 mmol/L Final   CO2 01/08/2021 25  22 - 32 mmol/L Final   Glucose, Bld 01/08/2021 415 (H)  70 - 99 mg/dL Final   Glucose reference range applies only to samples taken after fasting for at least 8 hours.   BUN 01/08/2021 12  6 - 20 mg/dL Final   Creatinine, Ser 01/08/2021 1.00  0.61 - 1.24 mg/dL Final   Calcium 01/08/2021 8.0 (L)  8.9 - 10.3 mg/dL Final   GFR, Estimated 01/08/2021 >60  >60 mL/min Final   Comment: (NOTE) Calculated using the CKD-EPI Creatinine Equation (2021)    Anion gap 01/08/2021 8  5 - 15 Final   Performed at [facility], [place] [address] [place], [place] [address]   WBC 01/08/2021 4.1  4.0 - 10.5 K/uL Final   RBC 01/08/2021 4.45  4.22 - 5.81 MIL/uL Final   Hemoglobin 01/08/2021 13.8  13.0 - 17.0 g/dL Final   HCT 01/08/2021 40.7  39.0 - 52.0 % Final   MCV 01/08/2021 91.5  80.0 - 100.0 fL Final   MCH 01/08/2021 31.0  26.0 - 34.0 pg Final   MCHC 01/08/2021 33.9  30.0 - 36.0 g/dL Final   RDW 01/08/2021 12.8  11.5 - 15.5 % Final   Platelets 01/08/2021 227  150 - 400 K/uL Final   nRBC  01/08/2021 0.0  0.0 - 0.2 % Final   Performed at [facility] [address], [place], [place] [address]   Color, Urine 01/08/2021 STRAW (A)  YELLOW Final   APPearance 01/08/2021 CLEAR  CLEAR Final   Specific Gravity, Urine 01/08/2021 1.015  1.005 - 1.030 Final   pH 01/08/2021 7.0  5.0 - 8.0 Final   Glucose, UA 01/08/2021 >=500 (A)  NEGATIVE mg/dL Final   Hgb urine dipstick 01/08/2021 NEGATIVE  NEGATIVE Final   Bilirubin Urine 01/08/2021 NEGATIVE  NEGATIVE Final    Ketones, ur 01/08/2021 5 (A)  NEGATIVE mg/dL Final   Protein, ur 01/08/2021 NEGATIVE  NEGATIVE mg/dL Final   Nitrite 01/08/2021 NEGATIVE  NEGATIVE Final   Leukocytes,Ua 01/08/2021 NEGATIVE  NEGATIVE Final   RBC / HPF 01/08/2021 0-5  0 - 5 RBC/hpf Final   WBC, UA 01/08/2021 0-5  0 - 5 WBC/hpf Final   Bacteria, UA 01/08/2021 NONE SEEN  NONE SEEN Final   Performed at [facility], [place] [address], [place], [place] [address]   Glucose-Capillary 01/08/2021 310 (H)  70 - 99 mg/dL Final   Glucose reference range applies only to samples taken after fasting for at least 8 hours.   Lithium Lvl 01/08/2021 <0.06 (L)  0.60 - 1.20 mmol/L Final   Performed at [facility] [address] [place],  [address]   Glucose-Capillary 01/08/2021 387 (H)  70 - 99 mg/dL Final   Glucose reference range applies only to samples taken after fasting for at least 8 hours.    Blood Alcohol level:  Lab Results  Component Value Date   ETH <10 04/16/2021   ETH <10 [id number]    Metabolic Disorder Labs: Lab Results  Component Value Date   HGBA1C 11.1 (H) 01/26/2021   MPG 271.87 01/26/2021   MPG 202.99 01/07/2019   No results found for: PROLACTIN Lab Results  Component Value Date   CHOL 140 01/30/2021   TRIG 95 01/30/2021   HDL 49 01/30/2021   CHOLHDL 2.9 01/30/2021   VLDL 19 01/30/2021   LDLCALC 72 01/30/2021   LDLCALC 67 05/20/2020    Therapeutic Lab Levels: Lab Results  Component Value Date   LITHIUM 0.32 (L) 04/16/2021   LITHIUM 0.70 02/23/2021   Lab Results  Component Value Date   VALPROATE <10.0 (L) 03/15/2011   VALPROATE 41.9 (L) 03/03/2011   No components found for:  CBMZ  Physical Findings   AIMS    Flowsheet Row Admission (Discharged) from 01/28/2021 in [facility] Admission (Discharged) from 07/18/2018 in [facility] Total Score 0 0      AUDIT    [facility] Admission  (Discharged) from 01/28/2021 in [facility] Admission (Discharged) from 07/18/2018 in [facility]  Alcohol Use Disorder Identification Test Final Score (AUDIT) 0 8      PHQ2-9    Flowsheet Row ED from 04/16/2021 in [facility] Office Visit from 12/30/2015 in [facility] at [facility] Total Score 0 0  PHQ-9 Total Score 0 --      Flowsheet Row ED from 04/19/2021 in [facility] Most recent reading at 04/19/2021 10:59 PM ED from 04/16/2021 in [facility] Most recent reading at 04/19/2021 11:17 AM ED from 04/16/2021 in [facility] Most recent reading at 04/16/2021  6:06 PM  C-SSRS RISK CATEGORY  No Risk No Risk No Risk        Musculoskeletal  Strength & Muscle Tone: within normal limits Gait & Station: normal Patient leans: N/A  Psychiatric Specialty Exam  Presentation  General Appearance: Fairly Groomed  Eye Contact:Fair  Speech:Clear and Coherent  Speech Volume:Normal  Handedness:Right   Mood and Affect  Mood:Irritable  Affect:Congruent   Thought Process  Thought Processes:Disorganized  Descriptions of Associations:Loose  Orientation:Partial  Thought Content:Tangential  Diagnosis of Schizophrenia or Schizoaffective disorder in past: Yes  Duration of Psychotic Symptoms: Greater than six months   Hallucinations:Hallucinations: Other (comment) (pt denies hallucination but he is noted to be speaking to himself)  Ideas of Reference:None  Suicidal Thoughts:Suicidal Thoughts: No  Homicidal Thoughts:Homicidal Thoughts: No   Sensorium  Memory:Immediate Good; Recent Fair; Remote Fair  Judgment:Impaired  Insight:Poor   Executive Functions  Concentration:Poor  Attention Span:Poor  Simpson   Psychomotor Activity  Psychomotor  Activity:Psychomotor Activity: Normal   Assets  Assets:Communication Skills; Desire for Improvement; Physical Health   Sleep  Sleep:Sleep: Good Number of Hours of Sleep: 8   Nutritional Assessment (For OBS and FBC admissions only) Has the patient had a weight loss or gain of 10 pounds or more in the last 3 months?: No Has the patient had a decrease in food intake/or appetite?: No Does the patient have dental problems?: No Does the patient have eating habits or behaviors that may be indicators of an eating disorder including binging or inducing vomiting?: No Has the patient recently lost weight without trying?: 0 Has the patient been eating poorly because of a decreased appetite?: 0 Malnutrition Screening Tool Score: 0    Physical Exam  Physical Exam Vitals and nursing note reviewed.  Constitutional:      Appearance: Normal appearance. He is well-developed.  HENT:     Head: Normocephalic.     Nose: Nose normal.  Cardiovascular:     Rate and Rhythm: Normal rate.  Pulmonary:     Effort: Pulmonary effort is normal.  Musculoskeletal:        General: Normal range of motion.     Cervical back: Normal range of motion.  Skin:    General: Skin is warm and dry.  Neurological:     Mental Status: He is alert and oriented to person, place, and time.  Psychiatric:        Attention and Perception: Attention normal.        Mood and Affect: Mood is anxious. Affect is labile.        Speech: Speech is rapid and pressured.        Behavior: Behavior is cooperative.        Thought Content: Thought content is paranoid.        Cognition and Memory: Cognition normal.   Review of Systems  Constitutional: Negative.   HENT: Negative.    Eyes: Negative.   Respiratory: Negative.    Cardiovascular: Negative.   Gastrointestinal: Negative.   Genitourinary: Negative.   Musculoskeletal: Negative.   Skin: Negative.   Neurological: Negative.   Endo/Heme/Allergies: Negative.    Psychiatric/Behavioral:  The patient is nervous/anxious.   Blood pressure 135/88, pulse 91, temperature (!) 97.5 F (36.4 C), temperature source Oral, resp. rate 16, SpO2 100 %. There is no height or weight on file to calculate BMI.  Treatment Plan Summary: Daily contact with patient to assess and evaluate symptoms and progress in treatment Patient reviewed with Dr Serafina Mitchell. He remains under involuntary  commitment. Continue to  recommend inpatient psychiatric hospitalizations.   Lucky Rathke, FNP 04/20/2021 8:46 AM

## 2021-04-20 NOTE — Progress Notes (Signed)
Patient has been denied by [facility] due to no appropriate beds. Patient meets BH inpatient criteria per Doran Heater, NP. Patient has been faxed out to the following facilities:   [facility]  [address] [place] [address] [phone]  [facility] [address]  [address], [place] [address] [phone]  [facility]  [address] [place] [address] [phone]  [facility]  [address] [place], [place] [address] [phone]  [facility]  [address] [place], [place] [address] [phone]  [facility]  [address] [place] [address] [phone]  [facility]  [address] [place], [place] [address] [phone]  [facility]  [address] [place] [address] [phone]  [facility]  [address], [place] [address] [phone]  [facility]  [address], [place] [address] [phone]  [facility]  [address] [place] [address] [phone]  [facility]  [address] [place] [address] [phone]  [facility]  [address] [place] [address] [phone]  [facility]  [address] [place] [address] [phone]  [facility]  [address] [place] [address] [phone]  [facility]  [address] [place], [place] [address] [phone]  [facility]  [address] [place] [address] [phone]  [facility]  [address], [place] [address] [phone]   Damita Dunnings, MSW, LCSW-A  11:45 AM 04/20/2021

## 2021-04-20 NOTE — ED Notes (Signed)
Pt was a little upset,  but redirectable.

## 2021-04-21 DIAGNOSIS — F250 Schizoaffective disorder, bipolar type: Secondary | ICD-10-CM

## 2021-04-21 LAB — RESP PANEL BY RT-PCR (FLU A&B, COVID) ARPGX2
Influenza A by PCR: NEGATIVE
Influenza B by PCR: NEGATIVE
SARS Coronavirus 2 by RT PCR: NEGATIVE

## 2021-04-21 LAB — GLUCOSE, CAPILLARY
Glucose-Capillary: 182 mg/dL — ABNORMAL HIGH (ref 70–99)
Glucose-Capillary: 301 mg/dL — ABNORMAL HIGH (ref 70–99)
Glucose-Capillary: 206 mg/dL — ABNORMAL HIGH (ref 70–99)

## 2021-04-21 NOTE — ED Notes (Signed)
Pt sleeping at present, no distress noted. Respirations even & unlabored.  Monitoring for safety. 

## 2021-04-21 NOTE — Progress Notes (Signed)
Inpatient Diabetes Program Recommendations  AACE/ADA: New Consensus Statement on Inpatient Glycemic Control (2015)  Target Ranges:  Prepandial:   less than 140 mg/dL      Peak postprandial:   less than 180 mg/dL (1-2 hours)      Critically ill patients:  140 - 180 mg/dL   Lab Results  Component Value Date   GLUCAP 301 (H) 04/21/2021   HGBA1C 11.1 (H) 01/26/2021    Review of Glycemic Control  Diabetes history: DM2 Outpatient Diabetes medications: 70/30 48 units with breakfast and 70/30 42 units with supper, glipizide 20 mg with breakfast Current orders for Inpatient glycemic control: 70/30 42 units with supper, Novolog 0-9 TID with meals  HgbA1C 11.1%  Needs BID dosing for 70/30 insulin.   Inpatient Diabetes Program Recommendations:    Add 70/30 30 units in am and continue 42 units with supper.   Follow closely.  Thank you. Ailene Ards, RD, LDN, CDE Inpatient Diabetes Coordinator [phone]

## 2021-04-21 NOTE — ED Provider Notes (Signed)
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 01/26/2021 NEGATIVE  NEGATIVE Final   Comment: (NOTE) SARS-CoV-2 target nucleic acids are NOT DETECTED.  The SARS-CoV-2 RNA is generally detectable in upper respiratory specimens during the acute phase of infection. The lowest concentration of SARS-CoV-2 viral copies this assay can detect is 138 copies/mL. A negative result does not preclude SARS-Cov-2 infection and should not be used as the sole basis for treatment or other patient management decisions. A negative result may occur with  improper specimen collection/handling, submission of specimen other than nasopharyngeal swab, presence of viral mutation(s) within the areas targeted by this assay, and inadequate number of  viral copies(<138 copies/mL). A negative result must be combined with clinical observations, patient history, and epidemiological information. The expected result is Negative.  Fact Sheet for Patients:  BloggerCourse.com  Fact Sheet for Healthcare Providers:  SeriousBroker.it  This test is no                          t yet approved or cleared by the [place] FDA and  has been authorized for detection and/or diagnosis of SARS-CoV-2 by FDA under an Emergency Use Authorization (EUA). This EUA will remain  in  effect (meaning this test can be used) for the duration of the COVID-19 declaration under Section 564(b)(1) of the Act, 21 U.S.C.section 360bbb-3(b)(1), unless the authorization is terminated  or revoked sooner.       Influenza A by PCR 01/26/2021 NEGATIVE  NEGATIVE Final   Influenza B by PCR 01/26/2021 NEGATIVE  NEGATIVE Final   Comment: (NOTE) The Xpert Xpress SARS-CoV-2/FLU/RSV plus assay is intended as an aid in the diagnosis of influenza from Nasopharyngeal swab specimens and should not be used as a sole basis for treatment. Nasal washings and aspirates are unacceptable for Xpert Xpress SARS-CoV-2/FLU/RSV testing.  Fact Sheet for Patients: BloggerCourse.com  Fact Sheet for Healthcare Providers: SeriousBroker.it  This test is not yet approved or cleared by the [place] FDA and has been authorized for detection and/or diagnosis of SARS-CoV-2 by FDA under an Emergency Use Authorization (EUA). This EUA will remain in effect (meaning this test can be used) for the duration of the COVID-19 declaration under Section 564(b)(1) of the Act, 21 U.S.C. section 360bbb-3(b)(1), unless the authorization is terminated or revoked.  Performed at [facility], [address] [place], [place] [address]    Sodium 01/26/2021 137  135 - 145 mmol/L Final   Potassium  01/26/2021 4.0  3.5 - 5.1 mmol/L Final   Chloride 01/26/2021 101  98 - 111 mmol/L Final   CO2 01/26/2021 25  22 - 32 mmol/L Final   Glucose, Bld 01/26/2021 269 (H)  70 - 99 mg/dL Final   Glucose reference range applies only to samples taken after fasting for at least 8 hours.   BUN 01/26/2021 13  6 - 20 mg/dL Final   Creatinine, Ser 01/26/2021 1.45 (H)  0.61 - 1.24 mg/dL Final   Calcium 17/40/8144 9.2  8.9 - 10.3 mg/dL Final   Total Protein 81/85/6314 7.9  6.5 - 8.1 g/dL Final   Albumin 97/04/6376 4.3  3.5 - 5.0 g/dL Final   AST 58/85/0277 30  15 - 41 U/L Final   ALT 01/26/2021 26  0 - 44 U/L Final   Alkaline Phosphatase 01/26/2021 105  38 - 126 U/L Final   Total Bilirubin 01/26/2021 1.4 (H)  0.3 - 1.2 mg/dL Final   GFR, Estimated 01/26/2021 56 (L)  >60 mL/min Final   Comment: (NOTE) Calculated using the CKD-EPI Creatinine Equation (2021)    Anion gap 01/26/2021 11  5 - 15 Final   Performed at [facility], [address] [place], [place] [address]   Alcohol, Ethyl (B) 01/26/2021 <10  <10 mg/dL Final   Comment: (NOTE) Lowest detectable limit for serum alcohol is 10 mg/dL.  For medical purposes only. Performed at [facility], [address] [place], [place] [address]    WBC 01/26/2021 7.1  4.0 - 10.5 K/uL Final   RBC 01/26/2021 4.76  4.22 - 5.81 MIL/uL Final   Hemoglobin 01/26/2021 14.7  13.0 - 17.0 g/dL Final   HCT 20/94/7096 45.2  39.0 - 52.0 % Final   MCV 01/26/2021 95.0  80.0 - 100.0 fL Final   MCH 01/26/2021 30.9  26.0 - 34.0 pg Final   MCHC 01/26/2021 32.5  30.0 - 36.0 g/dL Final   RDW 28/36/6294 12.8  11.5 - 15.5 % Final   Platelets 01/26/2021 277  150 - 400 K/uL Final   nRBC 01/26/2021 0.0  0.0 - 0.2 % Final   Neutrophils Relative % 01/26/2021 46  % Final   Neutro Abs 01/26/2021 3.3  1.7 -  7.7 K/uL Final   Lymphocytes Relative 01/26/2021 39  % Final   Lymphs Abs 01/26/2021 2.7  0.7 - 4.0 K/uL Final   Monocytes Relative 01/26/2021 10  % Final    Monocytes Absolute 01/26/2021 0.7  0.1 - 1.0 K/uL Final   Eosinophils Relative 01/26/2021 4  % Final   Eosinophils Absolute 01/26/2021 0.3  0.0 - 0.5 K/uL Final   Basophils Relative 01/26/2021 1  % Final   Basophils Absolute 01/26/2021 0.1  0.0 - 0.1 K/uL Final   Immature Granulocytes 01/26/2021 0  % Final   Abs Immature Granulocytes 01/26/2021 0.01  0.00 - 0.07 K/uL Final   Performed at [facility], [address], [place], [place] [address]   Hgb A1c MFr Bld 01/26/2021 11.1 (H)  4.8 - 5.6 % Final   Comment: (NOTE) Pre diabetes:          5.7%-6.4%  Diabetes:              >6.4%  Glycemic control for   <7.0% adults with diabetes    Mean Plasma Glucose 01/26/2021 271.87  mg/dL Final   Performed at [facility], [address], [place], [place] [address]   Glucose-Capillary 01/26/2021 381 (H)  70 - 99 mg/dL Final   Glucose reference range applies only to samples taken after fasting for at least 8 hours.   Lithium Lvl 01/27/2021 0.08 (L)  0.60 - 1.20 mmol/L Final   Performed at [facility], [address], [place], [place] [address]   Glucose-Capillary 01/27/2021 400 (H)  70 - 99 mg/dL Final   Glucose reference range applies only to samples taken after fasting for at least 8 hours.   Glucose-Capillary 01/27/2021 308 (H)  70 - 99 mg/dL Final   Glucose reference range applies only to samples taken after fasting for at least 8 hours.   Glucose-Capillary 01/27/2021 113 (H)  70 - 99 mg/dL Final   Glucose reference range applies only to samples taken after fasting for at least 8 hours.   Glucose-Capillary 01/27/2021 306 (H)  70 - 99 mg/dL Final   Glucose reference range applies only to samples taken after fasting for at least 8 hours.   Glucose-Capillary 01/27/2021 191 (H)  70 - 99 mg/dL Final   Glucose reference range applies only to samples taken after fasting for at least 8 hours.   Glucose-Capillary 01/28/2021 246 (H)  70 - 99 mg/dL Final   Glucose reference range  applies only to samples taken after fasting for at least 8 hours.   SARSCOV2ONAVIRUS 2 AG 01/28/2021 NEGATIVE  NEGATIVE Final   Comment: (NOTE) SARS-CoV-2 antigen NOT DETECTED.   Negative results are presumptive.  Negative results do not preclude SARS-CoV-2 infection and should not be used as the sole basis for treatment or other patient management decisions, including infection  control decisions, particularly in the presence of clinical signs and  symptoms consistent with COVID-19, or in those who have been in contact with the virus.  Negative results must be combined with clinical observations, patient history, and epidemiological information. The expected result is Negative.  Fact Sheet for Patients: https://www.jennings-kim.com/  Fact Sheet for Healthcare Providers: https://alexander-rogers.biz/  This test is not yet approved or cleared by the [place] FDA and  has been authorized for detection and/or diagnosis of SARS-CoV-2 by FDA under an Emergency Use Authorization (EUA).  This EUA will remain in effect (meaning this test can be used) for the duration of  the COV                          ID-19 declaration under Section 564(b)(1) of the Act, 21 U.S.C. section 360bbb-3(b)(1), unless the authorization is terminated or revoked sooner.     Glucose-Capillary 01/28/2021 281 (H)  70 - 99 mg/dL Final   Glucose reference range applies only to samples taken after fasting for at least 8 hours.  Admission on 01/21/2021, Discharged on 01/21/2021  Component Date Value Ref Range Status   Sodium 01/21/2021 134 (L)  135 - 145 mmol/L Final   Potassium 01/21/2021 4.1  3.5 - 5.1 mmol/L Final   Chloride 01/21/2021 97 (L)  98 - 111 mmol/L Final   CO2 01/21/2021 27  22 - 32 mmol/L Final   Glucose, Bld 01/21/2021 316 (H)  70 - 99 mg/dL Final   Glucose reference range applies only to samples taken after fasting for at least 8 hours.   BUN 01/21/2021 17  6 - 20 mg/dL  Final   Creatinine, Ser 01/21/2021 1.39 (H)  0.61 - 1.24 mg/dL Final   Calcium 19/14/782910/26/2022 9.5  8.9 - 10.3 mg/dL Final   Total Protein 56/21/308610/26/2022 7.7  6.5 - 8.1 g/dL Final   Albumin 57/84/696210/26/2022 4.0  3.5 - 5.0 g/dL Final   AST 95/28/413210/26/2022 22  15 - 41 U/L Final   ALT 01/21/2021 24  0 - 44 U/L Final   Alkaline Phosphatase 01/21/2021 96  38 - 126 U/L Final   Total Bilirubin 01/21/2021 1.0  0.3 - 1.2 mg/dL Final   GFR, Estimated 01/21/2021 58 (L)  >60 mL/min Final   Comment: (NOTE) Calculated using the CKD-EPI Creatinine Equation (2021)    Anion gap 01/21/2021 10  5 - 15 Final   Performed at [facility], [address] [place], [place] [address]   Alcohol, Ethyl (B) 01/21/2021 <10  <10 mg/dL Final   Comment: (NOTE) Lowest detectable limit for serum alcohol is 10 mg/dL.  For medical purposes only. Performed at [facility], [address] [place], [place] [address]    Opiates 01/21/2021 NONE DETECTED  NONE DETECTED Final   Cocaine 01/21/2021 NONE DETECTED  NONE DETECTED Final   Benzodiazepines 01/21/2021 NONE DETECTED  NONE DETECTED Final   Amphetamines 01/21/2021 NONE DETECTED  NONE DETECTED Final   Tetrahydrocannabinol 01/21/2021 NONE DETECTED  NONE DETECTED Final   Barbiturates 01/21/2021 NONE DETECTED  NONE DETECTED Final   Comment: (NOTE) DRUG SCREEN FOR MEDICAL PURPOSES ONLY.  IF CONFIRMATION IS NEEDED FOR ANY PURPOSE, NOTIFY LAB WITHIN 5 DAYS.  LOWEST DETECTABLE LIMITS FOR URINE DRUG SCREEN Drug Class                     Cutoff (ng/mL) Amphetamine and metabolites    1000 Barbiturate and metabolites    200 Benzodiazepine                 200 Tricyclics and metabolites     300 Opiates and metabolites        300 Cocaine and metabolites        300 THC                            50 Performed at [facility], [address] [place], [place] [address]    WBC 01/21/2021 5.9  4.0 - 10.5 K/uL Final   RBC 01/21/2021  4.93  4.22 - 5.81 MIL/uL Final   Hemoglobin  01/21/2021 15.4  13.0 - 17.0 g/dL Final   HCT 16/10/960410/26/2022 45.0  39.0 - 52.0 % Final   MCV 01/21/2021 91.3  80.0 - 100.0 fL Final   MCH 01/21/2021 31.2  26.0 - 34.0 pg Final   MCHC 01/21/2021 34.2  30.0 - 36.0 g/dL Final   RDW 54/09/811910/26/2022 13.0  11.5 - 15.5 % Final   Platelets 01/21/2021 271  150 - 400 K/uL Final   nRBC 01/21/2021 0.0  0.0 - 0.2 % Final   Neutrophils Relative % 01/21/2021 54  % Final   Neutro Abs 01/21/2021 3.2  1.7 - 7.7 K/uL Final   Lymphocytes Relative 01/21/2021 32  % Final   Lymphs Abs 01/21/2021 1.9  0.7 - 4.0 K/uL Final   Monocytes Relative 01/21/2021 9  % Final   Monocytes Absolute 01/21/2021 0.6  0.1 - 1.0 K/uL Final   Eosinophils Relative 01/21/2021 4  % Final   Eosinophils Absolute 01/21/2021 0.3  0.0 - 0.5 K/uL Final   Basophils Relative 01/21/2021 1  % Final   Basophils Absolute 01/21/2021 0.0  0.0 - 0.1 K/uL Final   Immature Granulocytes 01/21/2021 0  % Final   Abs Immature Granulocytes 01/21/2021 0.01  0.00 - 0.07 K/uL Final   Performed at [facility], [address] [place], [place] [address]   Lithium Lvl 01/21/2021 0.47 (L)  0.60 - 1.20 mmol/L Final   Performed at [facility], [address] [place], [place] [address]  Admission on 01/08/2021, Discharged on 01/08/2021  Component Date Value Ref Range Status   Glucose-Capillary 01/08/2021 405 (H)  70 - 99 mg/dL Final   Glucose reference range applies only to samples taken after fasting for at least 8 hours.   Sodium 01/08/2021 134 (L)  135 - 145 mmol/L Final   Potassium 01/08/2021 3.6  3.5 - 5.1 mmol/L Final   Chloride 01/08/2021 101  98 - 111 mmol/L Final   CO2 01/08/2021 25  22 - 32 mmol/L Final   Glucose, Bld 01/08/2021 415 (H)  70 - 99 mg/dL Final   Glucose reference range applies only to samples taken after fasting for at least 8 hours.   BUN 01/08/2021 12  6 - 20 mg/dL Final   Creatinine, Ser 01/08/2021 1.00  0.61 - 1.24 mg/dL Final   Calcium 30/86/578410/13/2022 8.0 (L)  8.9 - 10.3 mg/dL  Final   GFR, Estimated 01/08/2021 >60  >60 mL/min Final   Comment: (NOTE) Calculated using the CKD-EPI Creatinine Equation (2021)    Anion gap 01/08/2021 8  5 - 15 Final   Performed at [facility], [address] [place], [place] [address]   WBC 01/08/2021 4.1  4.0 - 10.5 K/uL Final   RBC 01/08/2021 4.45  4.22 - 5.81 MIL/uL Final   Hemoglobin 01/08/2021 13.8  13.0 - 17.0 g/dL Final   HCT 52/84/132410/13/2022 40.7  39.0 - 52.0 % Final   MCV 01/08/2021 91.5  80.0 - 100.0 fL Final   MCH 01/08/2021 31.0  26.0 - 34.0 pg Final   MCHC 01/08/2021 33.9  30.0 - 36.0 g/dL Final   RDW 40/10/272510/13/2022 12.8  11.5 - 15.5 % Final   Platelets 01/08/2021 227  150 - 400 K/uL Final   nRBC 01/08/2021 0.0  0.0 - 0.2 % Final   Performed at [facility], [address] [place], [place] [address]   Color, Urine 01/08/2021 STRAW (  A)  YELLOW Final   APPearance 01/08/2021 CLEAR  CLEAR Final   Specific Gravity, Urine 01/08/2021 1.015  1.005 - 1.030 Final   pH 01/08/2021 7.0  5.0 - 8.0 Final   Glucose, UA 01/08/2021 >=500 (A)  NEGATIVE mg/dL Final   Hgb urine dipstick 01/08/2021 NEGATIVE  NEGATIVE Final   Bilirubin Urine 01/08/2021 NEGATIVE  NEGATIVE Final   Ketones, ur 01/08/2021 5 (A)  NEGATIVE mg/dL Final   Protein, ur 16/12/9602 NEGATIVE  NEGATIVE mg/dL Final   Nitrite 54/11/8117 NEGATIVE  NEGATIVE Final   Leukocytes,Ua 01/08/2021 NEGATIVE  NEGATIVE Final   RBC / HPF 01/08/2021 0-5  0 - 5 RBC/hpf Final   WBC, UA 01/08/2021 0-5  0 - 5 WBC/hpf Final   Bacteria, UA 01/08/2021 NONE SEEN  NONE SEEN Final   Performed at [facility], [address], [place], [place] [address]   Glucose-Capillary 01/08/2021 310 (H)  70 - 99 mg/dL Final   Glucose reference range applies only to samples taken after fasting for at least 8 hours.   Lithium Lvl 01/08/2021 <0.06 (L)  0.60 - 1.20 mmol/L Final   Performed at [facility], [address] [place], [place]  [address]   Glucose-Capillary 01/08/2021 387 (H)  70 - 99 mg/dL Final   Glucose reference range applies only to samples taken after fasting for at least 8 hours.    Blood Alcohol level:  Lab Results  Component Value Date   ETH <10 04/16/2021   ETH <10 01/26/2021    Metabolic Disorder Labs: Lab Results  Component Value Date   HGBA1C 11.1 (H) 01/26/2021   MPG 271.87 01/26/2021   MPG 202.99 01/07/2019   No results found for: PROLACTIN Lab Results  Component Value Date   CHOL 140 01/30/2021   TRIG 95 01/30/2021   HDL 49 01/30/2021   CHOLHDL 2.9 01/30/2021   VLDL 19 01/30/2021   LDLCALC 72 01/30/2021   LDLCALC 67 05/20/2020    Therapeutic Lab Levels: Lab Results  Component Value Date   LITHIUM 0.32 (L) 04/16/2021   LITHIUM 0.70 02/23/2021   Lab Results  Component Value Date   VALPROATE <10.0 (L) 03/15/2011   VALPROATE 41.9 (L) 03/03/2011   No components found for:  CBMZ  Physical Findings   AIMS    Flowsheet Row [facility]) [facility] (Discharged) [facility]18/2018 in [facility]  AIMS Total Score 0 0      AUDIT    Flowsheet Row [facility]) [facility] Admission (Discharged) from 07/18/2018 in [facility]  Alcohol Use Disorder Identification Test Final Score (AUDIT) 0 8      PHQ2-9    Flowsheet Row ED from 04[facility]16/2021 [facility] Visit from 12/30/2015 in [facility] at [facility] Total Score 0 0  PHQ-9 Total Score 0 --      Flowsheet Row ED from 04/19/2021 in [facility] Most recent reading at 04/19/2021 10:59 PM ED from 04/16/2021 in [facility] EMERGENCY DEPARTMENT Most recent reading at 04/19/2021 11:17 AM ED from 04/16/2021 in [facility] Most recent reading at  04/16/2021  6:06 PM  C-SSRS RISK CATEGORY No Risk No Risk No Risk        Musculoskeletal  Strength & Muscle Tone: within normal limits Gait & Station: normal Patient leans: N/A  Psychiatric Specialty Exam  Presentation  General Appearance: Disheveled  Eye Contact:Fleeting  Speech:Clear and Coherent; Garbled (speach is garbled at times)  Speech Volume:Normal  Handedness:Right   Mood and Affect  Mood:Anxious  Affect:Congruent   Thought Process  Thought Processes:Coherent  Descriptions of Associations:Intact  Orientation:Full (Time, Place and Person)  Thought Content:Scattered  Diagnosis of Schizophrenia or Schizoaffective disorder in past: Yes  Duration of Psychotic Symptoms: Greater than six months   Hallucinations:Hallucinations: -- (patient is abserved speaking ot self and appears to be responding to internal/external stimuli)  Ideas of Reference:None  Suicidal Thoughts:Suicidal Thoughts: No  Homicidal Thoughts:Homicidal Thoughts: No   Sensorium  Memory:Immediate Poor; Recent Poor; Remote Poor  Judgment:Impaired  Insight:Lacking   Executive Functions  Concentration:Poor  Attention Span:Poor  Recall:Poor  Fund of Knowledge:Poor  Language:Fair   Psychomotor Activity  Psychomotor Activity:Psychomotor Activity: Normal   Assets  Assets:Communication Skills; Physical Health; Resilience   Sleep  Sleep:Sleep: Good   No data recorded  Physical Exam  Physical Exam Vitals and nursing note reviewed.  Constitutional:      General: He is not in acute distress.    Appearance: He is well-developed.  HENT:     Head: Normocephalic and atraumatic.  Eyes:     General:        Right eye: No discharge.        Left eye: No discharge.     Conjunctiva/sclera: Conjunctivae normal.  Cardiovascular:     Rate and Rhythm: Normal rate and regular rhythm.     Heart sounds: No murmur heard. Pulmonary:     Effort: Pulmonary effort is normal. No  respiratory distress.     Breath sounds: Normal breath sounds.  Abdominal:     Palpations: Abdomen is soft.     Tenderness: There is no abdominal tenderness.  Musculoskeletal:        General: No swelling. Normal range of motion.     Cervical back: Normal range of motion and neck supple.  Skin:    General: Skin is warm and dry.     Capillary Refill: Capillary refill takes less than 2 seconds.     Coloration: Skin is not jaundiced or pale.  Neurological:     Mental Status: He is alert and oriented to person, place, and time.  Psychiatric:        Attention and Perception: He is inattentive.        Mood and Affect: Mood is anxious.        Speech: Speech is tangential.        Behavior: Behavior is cooperative.        Thought Content: Thought content normal.        Cognition and Memory: Cognition normal.        Judgment: Judgment is impulsive.   Review of Systems  Constitutional: Negative.   HENT: Negative.    Eyes: Negative.   Respiratory: Negative.    Cardiovascular: Negative.   Genitourinary: Negative.   Musculoskeletal: Negative.   Skin: Negative.   Psychiatric/Behavioral:  The patient is nervous/anxious.   Blood pressure (!) 147/91, pulse 80, temperature 98.9 F (37.2 C), temperature source Oral, resp. rate 18, SpO2 99 %. There is no height or weight on file to calculate BMI.  Treatment Plan Summary: Daily contact with patient to assess and evaluate symptoms and progress in treatment and Medication management  Disposition: patient is recommended for inpatient Geropsych admission.  Ordered STAT CBC, CMP to recheck NA level, and  Lithium level ordered.  Ardis Hughs, NP 04/21/2021 12:24 PM

## 2021-04-21 NOTE — ED Notes (Signed)
Attempted blood draw x2 with no return.  Provider notified.  Pt tolerated draw well.  Will continue to monitor for safety.

## 2021-04-21 NOTE — ED Notes (Signed)
Pt awake, alert & responsive, no distress noted. Watching TV at present.  Monitoring for safety.

## 2021-04-21 NOTE — Progress Notes (Signed)
Inpatient Behavioral Health Placement  Pt meets inpatient criteria per Vernard Gambles, NP. There are no appropriate beds at [facility] per Malva Limes, RN, [facility]. Referral was sent to the following facilities;    Destination Service Provider Address Phone Fax  [facility]  [address], [place] [address] [phone]  [facility] [address]  [address] [place] [address] [phone]  [facility]  [address] [place] [address] [phone]  [facility]Ger[facility]  [address] [place], [place] [address] [phone]  [facility] Center-Adult  [address] [place], [place] [address] [phone]  [facility]  [address] [place] [address] [phone]  [facility]  [address] [place], [place] [address] [phone]  [facility]  [address] [place] [address] [phone]  [facility]  [address] [place] [address] [phone]  [facility]  [address], [place] [address] [phone]  [facility]  [address] [place] [address] [phone]  [facility]  [address], [place] [address] [phone]  [facility]  [address] [place] [address] [phone]  [facility]  [address], [place] [address] [phone]  [facility]  [address] [place] [address] [phone]  [facility]  [address] [place], [place] [address] [phone]   [facility]  [address], [place] [address] [phone]  [facility]  [address], [place] [address] [phone]  [facility]  [address] [place] [address] [phone]  [facility]  [address], [place] [address] [phone]  [facility]  [address] [place] [address] [phone]  [facility]  [address] [place] [address] [phone]  [facility]  [address], [place] [address] [phone]  [facility]  [address] [place] [address] [phone]  [facility]  [address], [place] [address] [phone]  [facility]  [address] [place] [address] [phone]   Situation ongoing,  CSW will follow up.   Maryjean Ka, MSW, [facility] 04/21/2021  @ 12:42 PM

## 2021-04-21 NOTE — Progress Notes (Signed)
Pt is awake, alert and oriented. Pt did not voice any complaints of pain or discomfort. Pt appears to be RIS as evidenced by pt talking to self and laughing inappropriately. No signs of acute distress noted. Administered scheduled meds with no incident. Pt denies current SI/HI/AVH. Staff will monitor for pt's safety.

## 2021-04-21 NOTE — ED Notes (Signed)
Pt awake, alert & responsive, no distress noted.  Monitoring for safety. °

## 2021-04-22 DIAGNOSIS — F250 Schizoaffective disorder, bipolar type: Secondary | ICD-10-CM | POA: Diagnosis not present

## 2021-04-22 LAB — COMPREHENSIVE METABOLIC PANEL
ALT: 49 U/L — ABNORMAL HIGH (ref 0–44)
AST: 43 U/L — ABNORMAL HIGH (ref 15–41)
Albumin: 3.7 g/dL (ref 3.5–5.0)
Alkaline Phosphatase: 86 U/L (ref 38–126)
Anion gap: 10 (ref 5–15)
BUN: 19 mg/dL (ref 6–20)
CO2: 23 mmol/L (ref 22–32)
Calcium: 9.6 mg/dL (ref 8.9–10.3)
Chloride: 97 mmol/L — ABNORMAL LOW (ref 98–111)
Creatinine, Ser: 1.49 mg/dL — ABNORMAL HIGH (ref 0.61–1.24)
GFR, Estimated: 53 mL/min — ABNORMAL LOW (ref >60)
Glucose, Bld: 325 mg/dL — ABNORMAL HIGH (ref 70–99)
Potassium: 4.2 mmol/L (ref 3.5–5.1)
Sodium: 130 mmol/L — ABNORMAL LOW (ref 135–145)
Total Bilirubin: 0.4 mg/dL (ref 0.3–1.2)
Total Protein: 6.9 g/dL (ref 6.5–8.1)

## 2021-04-22 LAB — CBC WITH DIFFERENTIAL/PLATELET
Abs Immature Granulocytes: 0.03 10*3/uL (ref 0.00–0.07)
Basophils Absolute: 0 10*3/uL (ref 0.0–0.1)
Basophils Relative: 0 %
Eosinophils Absolute: 0.3 10*3/uL (ref 0.0–0.5)
Eosinophils Relative: 3 %
HCT: 38.4 % — ABNORMAL LOW (ref 39.0–52.0)
Hemoglobin: 12.8 g/dL — ABNORMAL LOW (ref 13.0–17.0)
Immature Granulocytes: 0 %
Lymphocytes Relative: 33 %
Lymphs Abs: 2.4 10*3/uL (ref 0.7–4.0)
MCH: 30.6 pg (ref 26.0–34.0)
MCHC: 33.3 g/dL (ref 30.0–36.0)
MCV: 91.9 fL (ref 80.0–100.0)
Monocytes Absolute: 0.4 10*3/uL (ref 0.1–1.0)
Monocytes Relative: 6 %
Neutro Abs: 4.1 10*3/uL (ref 1.7–7.7)
Neutrophils Relative %: 58 %
Platelets: 231 10*3/uL (ref 150–400)
RBC: 4.18 MIL/uL — ABNORMAL LOW (ref 4.22–5.81)
RDW: 13 % (ref 11.5–15.5)
WBC: 7.3 10*3/uL (ref 4.0–10.5)
nRBC: 0 % (ref 0.0–0.2)

## 2021-04-22 LAB — LITHIUM LEVEL: Lithium Lvl: 1.09 mmol/L (ref 0.60–1.20)

## 2021-04-22 LAB — GLUCOSE, CAPILLARY
Glucose-Capillary: 208 mg/dL — ABNORMAL HIGH (ref 70–99)
Glucose-Capillary: 208 mg/dL — ABNORMAL HIGH (ref 70–99)
Glucose-Capillary: 317 mg/dL — ABNORMAL HIGH (ref 70–99)

## 2021-04-22 NOTE — ED Notes (Signed)
Pt sleeping at present, no distress noted. Respirations even & unlabored.  Monitoring for safety. 

## 2021-04-22 NOTE — Progress Notes (Signed)
Patient has been denied by [facility] due to no appropriate beds available. Patient meets [place] inpatient criteria per Thomes Lolling, NP. Patient has been faxed out to the following facilities:   [facility]  [address] [place] [address] [phone]  [facility] [place], [place] [phone] [facility]  [address] [place] [address] [phone] [facility]  [place], [place] [address] [phone]  [place]  [place], [place] [address] [phone]  [facility]  [address], [place] [address] [phone]  [facility]  [address] [place], [place] [address] [phone] [facility]  [address] [place] [address] [phone] [facility]  [address], [place] [address] [phone]  [facility]  [address], [place] [address] [phone] [facility]  [address] [place] [address] [phone] [place]  [address] [place] [address] [phone]  [facility]  [address], [place] [address] [phone]  [facility]  [address] [place] [address] [place]  [address], [place] [address] [phone] [facility]  [place], [place] [address] [place]  [facility]  [address] [place] [address]  [phone]  [facility]  [address] [place], [place] [address] [phone] [address] [place] [address] [phone]  [facility] [address] [facility] [address] [place] [address] [phone]  [facility]  [address] [place] [address] [phone]  [facility]  [address] [place] [address] [phone]  [facility]  [address] [place] [address] [phone]  [facility]  [address] [place] [address] [place]  [facility]  [address], [place] [address] [id number]  [place]., [place] [address] [phone] [place], MSW, LCSW-A  9:47 AM 04/22/2021

## 2021-04-22 NOTE — Progress Notes (Signed)
Inpatient Diabetes Program Recommendations  AACE/ADA: New Consensus Statement on Inpatient Glycemic Control (2015)  Target Ranges:  Prepandial:   less than 140 mg/dL      Peak postprandial:   less than 180 mg/dL (1-2 hours)      Critically ill patients:  140 - 180 mg/dL   Lab Results  Component Value Date   GLUCAP 208 (H) 04/22/2021   HGBA1C 11.1 (H) 01/26/2021    Review of Glycemic Control  Latest Reference Range & Units 04/21/21 08:00 04/21/21 12:00 04/21/21 17:03 04/22/21 06:20 04/22/21 08:04  Glucose-Capillary 70 - 99 mg/dL 185 (H) 631 (H) 497 (H) 208 (H) 208 (H)   Diabetes history: DM2 Outpatient Diabetes medications: 70/30 48 units with breakfast and 70/30 42 units with supper, glipizide 20 mg with breakfast Current orders for Inpatient glycemic control: 70/30 42 units with supper, Novolog 0-9 TID with meals   HgbA1C 11.1%   Needs BID dosing for 70/30 insulin.   Inpatient Diabetes Program Recommendations:     Add 70/30 30 units in am and continue 42 units with supper.   Thank you, Billy Fischer. Mishal Probert, RN, MSN, CDE  Diabetes Coordinator Inpatient Glycemic Control Team Team Pager [phone] (8am-5pm) 04/22/2021 8:33 AM

## 2021-04-22 NOTE — ED Notes (Signed)
Pt was given chicken and pasta for [employer].

## 2021-04-22 NOTE — ED Notes (Signed)
Pt sitting up in chair sleeping at present, no distress noted.  Respirations even & unlabored.  Monitoring for safety.

## 2021-04-22 NOTE — ED Notes (Signed)
Pt staring out of window talking to self. Denies concerns. Will continue to monitor for safety.

## 2021-04-22 NOTE — ED Provider Notes (Signed)
Behavioral Health Progress Note  Date and Time: 04/22/2021 4:45 PM Name: Mitchell [name] MRN:  [id number]  Subjective: Patient states "I want to go home, I am going home, I am not going anywhere else."  Patient is reassessed by nurse practitioner, face-to-face.  He is visualized standing in observation area, upon my approach.  He is alert and oriented.  He presents with anxious mood, labile affect.  Tangential conversation, speech appears pressured..  Patient states "I am getting food to eat but I do not care if I do not get any food, I can survive on water alone." He endorses average sleep and appetite.  Continues to present with apparent paranoia he states "I do not know any of these people!"  Mitchell [name] denies suicidal and homicidal ideations.  He denies auditory and visual hallucinations.  He is minimally participative in reassessment.  He continues to repeat "I am ready to go home."  Patient offered support and encouragement.  Attempted to discuss current treatment plan to include inpatient psychiatric hospitalization.  Patient appears to become frustrated, labile affect.  Diagnosis:  Final diagnoses:  Schizoaffective disorder, bipolar type (HCC)    Total Time spent with patient: 20 minutes  Past Psychiatric History: Bipolar affective disorder, schizoaffective disorder, schizophrenia, paranoid type Past Medical History:  Past Medical History:  Diagnosis Date   Bipolar affective disorder (HCC)    takes Zyprexa daily   Diabetes mellitus    takes Victoza,Metformin,and Glipizide daily   Hypertension    takes Amlodipine,Lisinopril and Clonidine daily   Hyponatremia    history of   Mental disorder    takes Lithium daily   Schizoaffective disorder    takes Trazodone nightly   Seasonal allergies    takes Claritin daily   Sleep apnea    sleep study >46yrs ago   Stroke [facility])    left arm weakness    Past Surgical History:  Procedure Laterality Date   CATARACT EXTRACTION W/PHACO  Right 02/14/2013   Procedure: CATARACT EXTRACTION PHACO AND INTRAOCULAR LENS PLACEMENT (IOC);  Surgeon: Shade Flood, MD;  Location: [facility] OR;  Service: Ophthalmology;  Laterality: Right;   CATARACT EXTRACTION W/PHACO Left 06/13/2013   Procedure: CATARACT EXTRACTION PHACO AND INTRAOCULAR LENS PLACEMENT (IOC);  Surgeon: Shade Flood, MD;  Location: [facility] OR;  Service: Ophthalmology;  Laterality: Left;   CIRCUMCISION  20 yrs. ago   EYE SURGERY     Family History: No family history on file. Family Psychiatric  History: None reported Social History:  Social History   Substance and Sexual Activity  Alcohol Use Not Currently     Social History   Substance and Sexual Activity  Drug Use No    Social History   Socioeconomic History   Marital status: Divorced    Spouse name: Not on file   Number of children: Not on file   Years of education: Not on file   Highest education level: Not on file  Occupational History   Not on file  Tobacco Use   Smoking status: Never   Smokeless tobacco: Never  Vaping Use   Vaping Use: Never used  Substance and Sexual Activity   Alcohol use: Not Currently   Drug use: No   Sexual activity: Yes    Birth control/protection: None  Other Topics Concern   Not on file  Social History Narrative   Not on file   Social Determinants of Health   Financial Resource Strain: Not on file  Food Insecurity: Not on file  Transportation  Needs: Not on file  Physical Activity: Not on file  Stress: Not on file  Social Connections: Not on file   SDOH:  SDOH Screenings   Alcohol Screen: Low Risk    Last Alcohol Screening Score (AUDIT): 0  Depression (PHQ2-9): Low Risk    PHQ-2 Score: 0  Financial Resource Strain: Not on file  Food Insecurity: Not on file  Housing: Not on file  Physical Activity: Not on file  Social Connections: Not on file  Stress: Not on file  Tobacco Use: Low Risk    Smoking Tobacco Use: Never   Smokeless Tobacco Use: Never   Passive  Exposure: Not on file  Transportation Needs: Not on file   Additional Social History:                         Sleep: Good  Appetite:  Good  Current Medications:  Current Facility-Administered Medications  Medication Dose Route Frequency Provider Last Rate Last Admin   acetaminophen (TYLENOL) tablet 650 mg  650 mg Oral Q6H PRN Ajibola, Ene A, NP       alum & mag hydroxide-simeth (MAALOX/MYLANTA) 200-200-20 MG/5ML suspension 30 mL  30 mL Oral Q4H PRN Ajibola, Ene A, NP       amLODipine (NORVASC) tablet 10 mg  10 mg Oral Daily Ajibola, Ene A, NP   10 mg at 04/22/21 0911   atenolol (TENORMIN) tablet 12.5 mg  12.5 mg Oral Daily Ajibola, Ene A, NP   12.5 mg at 04/22/21 0908   benztropine (COGENTIN) tablet 1 mg  1 mg Oral BID Ajibola, Ene A, NP   1 mg at 04/22/21 0908   cloNIDine (CATAPRES) tablet 0.2 mg  0.2 mg Oral BID Ajibola, Ene A, NP   0.2 mg at 04/22/21 0908   cloZAPine (CLOZARIL) tablet 50 mg  50 mg Oral BID Ajibola, Ene A, NP   50 mg at 04/22/21 0907   haloperidol (HALDOL) tablet 20 mg  20 mg Oral QHS Ajibola, Ene A, NP   20 mg at 04/21/21 2129   hydrOXYzine (ATARAX) tablet 25 mg  25 mg Oral TID PRN Ajibola, Ene A, NP       insulin aspart (novoLOG) injection 0-9 Units  0-9 Units Subcutaneous TID WC Ajibola, Ene A, NP   7 Units at 04/22/21 1242   insulin aspart protamine- aspart (NOVOLOG MIX 70/30) injection 42 Units  42 Units Subcutaneous Q supper Ajibola, Ene A, NP   42 Units at 04/21/21 1812   lisinopril (ZESTRIL) tablet 40 mg  40 mg Oral Daily Ajibola, Ene A, NP   40 mg at 04/22/21 0908   lithium carbonate (ESKALITH) CR tablet 450 mg  450 mg Oral Q12H Ajibola, Ene A, NP   450 mg at 04/22/21 0911   magnesium hydroxide (MILK OF MAGNESIA) suspension 30 mL  30 mL Oral Daily PRN Ajibola, Ene A, NP       traZODone (DESYREL) tablet 50 mg  50 mg Oral QHS PRN Ajibola, Ene A, NP       traZODone (DESYREL) tablet 50 mg  50 mg Oral QHS Ajibola, Ene A, NP   50 mg at 04/21/21 2130    Current Outpatient Medications  Medication Sig Dispense Refill   amLODipine (NORVASC) 10 MG tablet Take 1 tablet (10 mg total) by mouth daily. (Patient not taking: Reported on 04/16/2021) 90 tablet 1   aspirin 81 MG EC tablet Take 1 tablet (81 mg total) by mouth daily.  Swallow whole. (Patient not taking: Reported on 04/16/2021) 30 tablet 12   atenolol (TENORMIN) 25 MG tablet Take 0.5 tablets (12.5 mg total) by mouth daily. (Patient not taking: Reported on 04/16/2021) 15 tablet 0   benztropine (COGENTIN) 1 MG tablet Take 1 tablet (1 mg total) by mouth 2 (two) times daily. 60 tablet 2   cloNIDine (CATAPRES) 0.2 MG tablet Take 0.2 mg by mouth 2 (two) times daily. (Patient not taking: Reported on 01/26/2021)     cloZAPine (CLOZARIL) 50 MG tablet Take 1 tablet (50 mg total) by mouth 2 (two) times daily. 60 tablet 0   glipiZIDE (GLUCOTROL XL) 10 MG 24 hr tablet Take 2 tablets (20 mg total) by mouth daily with breakfast. 60 tablet 0   haloperidol (HALDOL) 20 MG tablet Take 1 tablet (20 mg total) by mouth at bedtime. (Patient not taking: Reported on 01/26/2021) 90 tablet 1   insulin aspart protamine - aspart (NOVOLOG MIX 70/30 FLEXPEN) (70-30) 100 UNIT/ML FlexPen Inject 48 Units into the skin daily with breakfast. 15 mL 0   insulin aspart protamine - aspart (NOVOLOG MIX 70/30 FLEXPEN) (70-30) 100 UNIT/ML FlexPen Inject 42 Units into the skin daily with supper. 15 mL 0   lisinopril (ZESTRIL) 40 MG tablet Take 1 tablet (40 mg total) by mouth daily. (Patient not taking: Reported on 01/11/2020) 90 tablet 1   lithium carbonate (ESKALITH) 450 MG CR tablet Take 1 tablet (450 mg total) by mouth every 12 (twelve) hours. (Patient not taking: Reported on 01/26/2021) 60 tablet 2   traZODone (DESYREL) 50 MG tablet Take 1 tablet (50 mg total) by mouth at bedtime. (Patient not taking: Reported on 04/16/2021) 30 tablet 0    Labs  Lab Results:  Admission on 04/19/2021  Component Date Value Ref Range Status    Glucose-Capillary 04/20/2021 79  70 - 99 mg/dL Final   Glucose reference range applies only to samples taken after fasting for at least 8 hours.   Glucose-Capillary 04/20/2021 227 (H)  70 - 99 mg/dL Final   Glucose reference range applies only to samples taken after fasting for at least 8 hours.   Glucose-Capillary 04/20/2021 236 (H)  70 - 99 mg/dL Final   Glucose reference range applies only to samples taken after fasting for at least 8 hours.   Glucose-Capillary 04/20/2021 140 (H)  70 - 99 mg/dL Final   Glucose reference range applies only to samples taken after fasting for at least 8 hours.   Glucose-Capillary 04/20/2021 196 (H)  70 - 99 mg/dL Final   Glucose reference range applies only to samples taken after fasting for at least 8 hours.   Glucose-Capillary 04/21/2021 182 (H)  70 - 99 mg/dL Final   Glucose reference range applies only to samples taken after fasting for at least 8 hours.   SARS Coronavirus 2 by RT PCR 04/21/2021 NEGATIVE  NEGATIVE Final   Comment: (NOTE) SARS-CoV-2 target nucleic acids are NOT DETECTED.  The SARS-CoV-2 RNA is generally detectable in upper respiratory specimens during the acute phase of infection. The lowest concentration of SARS-CoV-2 viral copies this assay can detect is 138 copies/mL. A negative result does not preclude SARS-Cov-2 infection and should not be used as the sole basis for treatment or other patient management decisions. A negative result may occur with  improper specimen collection/handling, submission of specimen other than nasopharyngeal swab, presence of viral mutation(s) within the areas targeted by this assay, and inadequate number of viral copies(<138 copies/mL). A negative result must be  combined with clinical observations, patient history, and epidemiological information. The expected result is Negative.  Fact Sheet for Patients:  BloggerCourse.com  Fact Sheet for Healthcare Providers:   SeriousBroker.it  This test is no                          t yet approved or cleared by the [place] FDA and  has been authorized for detection and/or diagnosis of SARS-CoV-2 by FDA under an Emergency Use Authorization (EUA). This EUA will remain  in effect (meaning this test can be used) for the duration of the COVID-19 declaration under Section 564(b)(1) of the Act, 21 U.S.C.section 360bbb-3(b)(1), unless the authorization is terminated  or revoked sooner.       Influenza A by PCR 04/21/2021 NEGATIVE  NEGATIVE Final   Influenza B by PCR 04/21/2021 NEGATIVE  NEGATIVE Final   Comment: (NOTE) The Xpert Xpress SARS-CoV-2/FLU/RSV plus assay is intended as an aid in the diagnosis of influenza from Nasopharyngeal swab specimens and should not be used as a sole basis for treatment. Nasal washings and aspirates are unacceptable for Xpert Xpress SARS-CoV-2/FLU/RSV testing.  Fact Sheet for Patients: BloggerCourse.com  Fact Sheet for Healthcare Providers: SeriousBroker.it  This test is not yet approved or cleared by the [place] FDA and has been authorized for detection and/or diagnosis of SARS-CoV-2 by FDA under an Emergency Use Authorization (EUA). This EUA will remain in effect (meaning this test can be used) for the duration of the COVID-19 declaration under Section 564(b)(1) of the Act, 21 U.S.C. section 360bbb-3(b)(1), unless the authorization is terminated or revoked.  Performed at [facility], [address], [place], [place] [address]    Glucose-Capillary 04/21/2021 301 (H)  70 - 99 mg/dL Final   Glucose reference range applies only to samples taken after fasting for at least 8 hours.   Lithium Lvl 04/21/2021 1.09  0.60 - 1.20 mmol/L Final   Performed at [facility], [address] [place], [place] [address]   Sodium 04/21/2021 130 (L)  135 - 145 mmol/L Final   Potassium  04/21/2021 4.2  3.5 - 5.1 mmol/L Final   Chloride 04/21/2021 97 (L)  98 - 111 mmol/L Final   CO2 04/21/2021 23  22 - 32 mmol/L Final   Glucose, Bld 04/21/2021 325 (H)  70 - 99 mg/dL Final   Glucose reference range applies only to samples taken after fasting for at least 8 hours.   BUN 04/21/2021 19  6 - 20 mg/dL Final   Creatinine, Ser 04/21/2021 1.49 (H)  0.61 - 1.24 mg/dL Final   Calcium 09/81/1914 9.6  8.9 - 10.3 mg/dL Final   Total Protein 78/29/5621 6.9  6.5 - 8.1 g/dL Final   Albumin 30/86/5784 3.7  3.5 - 5.0 g/dL Final   AST 69/62/9528 43 (H)  15 - 41 U/L Final   ALT 04/21/2021 49 (H)  0 - 44 U/L Final   Alkaline Phosphatase 04/21/2021 86  38 - 126 U/L Final   Total Bilirubin 04/21/2021 0.4  0.3 - 1.2 mg/dL Final   GFR, Estimated 04/21/2021 53 (L)  >60 mL/min Final   Comment: (NOTE) Calculated using the CKD-EPI Creatinine Equation (2021)    Anion gap 04/21/2021 10  5 - 15 Final   Performed at [facility], [address] [place], [place] [address]   WBC 04/21/2021 7.3  4.0 - 10.5 K/uL Final   RBC 04/21/2021 4.18 (L)  4.22 - 5.81 MIL/uL Final   Hemoglobin 04/21/2021 12.8 (L)  13.0 - 17.0 g/dL Final   HCT 16/12/9602 38.4 (L)  39.0 - 52.0 % Final   MCV 04/21/2021 91.9  80.0 - 100.0 fL Final   MCH 04/21/2021 30.6  26.0 - 34.0 pg Final   MCHC 04/21/2021 33.3  30.0 - 36.0 g/dL Final   RDW 54/11/8117 13.0  11.5 - 15.5 % Final   Platelets 04/21/2021 231  150 - 400 K/uL Final   nRBC 04/21/2021 0.0  0.0 - 0.2 % Final   Neutrophils Relative % 04/21/2021 58  % Final   Neutro Abs 04/21/2021 4.1  1.7 - 7.7 K/uL Final   Lymphocytes Relative 04/21/2021 33  % Final   Lymphs Abs 04/21/2021 2.4  0.7 - 4.0 K/uL Final   Monocytes Relative 04/21/2021 6  % Final   Monocytes Absolute 04/21/2021 0.4  0.1 - 1.0 K/uL Final   Eosinophils Relative 04/21/2021 3  % Final   Eosinophils Absolute 04/21/2021 0.3  0.0 - 0.5 K/uL Final   Basophils Relative 04/21/2021 0  % Final   Basophils Absolute  04/21/2021 0.0  0.0 - 0.1 K/uL Final   Immature Granulocytes 04/21/2021 0  % Final   Abs Immature Granulocytes 04/21/2021 0.03  0.00 - 0.07 K/uL Final   Performed at [facility], [address] [place], [place] [address]   Glucose-Capillary 04/21/2021 206 (H)  70 - 99 mg/dL Final   Glucose reference range applies only to samples taken after fasting for at least 8 hours.   Glucose-Capillary 04/22/2021 208 (H)  70 - 99 mg/dL Final   Glucose reference range applies only to samples taken after fasting for at least 8 hours.   Glucose-Capillary 04/22/2021 208 (H)  70 - 99 mg/dL Final   Glucose reference range applies only to samples taken after fasting for at least 8 hours.   Glucose-Capillary 04/22/2021 317 (H)  70 - 99 mg/dL Final   Glucose reference range applies only to samples taken after fasting for at least 8 hours.  Admission on 04/16/2021, Discharged on 04/19/2021  Component Date Value Ref Range Status   Sodium 04/16/2021 128 (L)  135 - 145 mmol/L Final   Potassium 04/16/2021 5.0  3.5 - 5.1 mmol/L Final   Chloride 04/16/2021 95 (L)  98 - 111 mmol/L Final   CO2 04/16/2021 22  22 - 32 mmol/L Final   Glucose, Bld 04/16/2021 492 (H)  70 - 99 mg/dL Final   Glucose reference range applies only to samples taken after fasting for at least 8 hours.   BUN 04/16/2021 19  6 - 20 mg/dL Final   Creatinine, Ser 04/16/2021 1.61 (H)  0.61 - 1.24 mg/dL Final   Calcium 95/62/1308 9.0  8.9 - 10.3 mg/dL Final   Total Protein 65/78/4696 7.3  6.5 - 8.1 g/dL Final   Albumin 29/52/8413 4.0  3.5 - 5.0 g/dL Final   AST 24/40/1027 28  15 - 41 U/L Final   ALT 04/16/2021 21  0 - 44 U/L Final   Alkaline Phosphatase 04/16/2021 110  38 - 126 U/L Final   Total Bilirubin 04/16/2021 0.9  0.3 - 1.2 mg/dL Final   GFR, Estimated 04/16/2021 49 (L)  >60 mL/min Final   Comment: (NOTE) Calculated using the CKD-EPI Creatinine Equation (2021)    Anion gap 04/16/2021 11  5 - 15 Final   Performed at [facility], [address], [place], [place] [address]  Alcohol, Ethyl (B) 04/16/2021 <10  <10 mg/dL Final   Comment: (NOTE) Lowest detectable limit for serum alcohol is 10 mg/dL.  For medical purposes only. Performed at [facility], [address], [place], [place] [address]    WBC 04/16/2021 6.0  4.0 - 10.5 K/uL Final   RBC 04/16/2021 4.31  4.22 - 5.81 MIL/uL Final   Hemoglobin 04/16/2021 13.3  13.0 - 17.0 g/dL Final   HCT 60/45/4098 40.0  39.0 - 52.0 % Final   MCV 04/16/2021 92.8  80.0 - 100.0 fL Final   MCH 04/16/2021 30.9  26.0 - 34.0 pg Final   MCHC 04/16/2021 33.3  30.0 - 36.0 g/dL Final   RDW 11/91/4782 12.7  11.5 - 15.5 % Final   Platelets 04/16/2021 232  150 - 400 K/uL Final   nRBC 04/16/2021 0.0  0.0 - 0.2 % Final   Performed at [facility], [address], [place], [place] [address]   Opiates 04/16/2021 NONE DETECTED  NONE DETECTED Final   Cocaine 04/16/2021 NONE DETECTED  NONE DETECTED Final   Benzodiazepines 04/16/2021 NONE DETECTED  NONE DETECTED Final   Amphetamines 04/16/2021 NONE DETECTED  NONE DETECTED Final   Tetrahydrocannabinol 04/16/2021 NONE DETECTED  NONE DETECTED Final   Barbiturates 04/16/2021 NONE DETECTED  NONE DETECTED Final   Comment: (NOTE) DRUG SCREEN FOR MEDICAL PURPOSES ONLY.  IF CONFIRMATION IS NEEDED FOR ANY PURPOSE, NOTIFY LAB WITHIN 5 DAYS.  LOWEST DETECTABLE LIMITS FOR URINE DRUG SCREEN Drug Class                     Cutoff (ng/mL) Amphetamine and metabolites    1000 Barbiturate and metabolites    200 Benzodiazepine                 200 Tricyclics and metabolites     300 Opiates and metabolites        300 Cocaine and metabolites        300 THC                            50 Performed at [facility], [address], [place], [place] [address]    Glucose-Capillary 04/16/2021 486 (H)  70 - 99 mg/dL Final   Glucose reference range applies only to samples taken after fasting for at least 8 hours.   Lithium Lvl  04/16/2021 0.32 (L)  0.60 - 1.20 mmol/L Final   Performed at [facility], [address], [place], [place] [address]   SARS Coronavirus 2 by RT PCR 04/16/2021 NEGATIVE  NEGATIVE Final   Comment: (NOTE) SARS-CoV-2 target nucleic acids are NOT DETECTED.  The SARS-CoV-2 RNA is generally detectable in upper respiratory specimens during the acute phase of infection. The lowest concentration of SARS-CoV-2 viral copies this assay can detect is 138 copies/mL. A negative result does not preclude SARS-Cov-2 infection and should not be used as the sole basis for treatment or other patient management decisions. A negative result may occur with  improper specimen collection/handling, submission of specimen other than nasopharyngeal swab, presence of viral mutation(s) within the areas targeted by this assay, and inadequate number of viral copies(<138 copies/mL). A negative result must be combined with clinical observations, patient history, and epidemiological information. The expected result is Negative.  Fact Sheet for Patients:  BloggerCourse.com  Fact Sheet for Healthcare Providers:  SeriousBroker.it  This test is no  t yet approved or cleared by the [place] and  has been authorized for detection and/or diagnosis of SARS-CoV-2 by FDA under an Emergency Use Authorization (EUA). This EUA will remain  in effect (meaning this test can be used) for the duration of the COVID-19 declaration under Section 564(b)(1) of the Act, 21 U.S.C.section 360bbb-3(b)(1), unless the authorization is terminated  or revoked sooner.       Influenza A by PCR 04/16/2021 NEGATIVE  NEGATIVE Final   Influenza B by PCR 04/16/2021 NEGATIVE  NEGATIVE Final   Comment: (NOTE) The Xpert Xpress SARS-CoV-2/FLU/RSV plus assay is intended as an aid in the diagnosis of influenza from Nasopharyngeal swab specimens and should not be used  as a sole basis for treatment. Nasal washings and aspirates are unacceptable for Xpert Xpress SARS-CoV-2/FLU/RSV testing.  Fact Sheet for Patients: BloggerCourse.com  Fact Sheet for Healthcare Providers: SeriousBroker.it  This test is not yet approved or cleared by the [place] FDA and has been authorized for detection and/or diagnosis of SARS-CoV-2 by FDA under an Emergency Use Authorization (EUA). This EUA will remain in effect (meaning this test can be used) for the duration of the COVID-19 declaration under Section 564(b)(1) of the Act, 21 U.S.C. section 360bbb-3(b)(1), unless the authorization is terminated or revoked.  Performed at [facility], [address] [place], [place] [address]    Glucose-Capillary 04/16/2021 451 (H)  70 - 99 mg/dL Final   Glucose reference range applies only to samples taken after fasting for at least 8 hours.   Glucose-Capillary 04/16/2021 373 (H)  70 - 99 mg/dL Final   Glucose reference range applies only to samples taken after fasting for at least 8 hours.   WBC 04/16/2021 5.8  4.0 - 10.5 K/uL Final   RBC 04/16/2021 4.07 (L)  4.22 - 5.81 MIL/uL Final   Hemoglobin 04/16/2021 12.8 (L)  13.0 - 17.0 g/dL Final   HCT 52/84/1324 37.1 (L)  39.0 - 52.0 % Final   MCV 04/16/2021 91.2  80.0 - 100.0 fL Final   MCH 04/16/2021 31.4  26.0 - 34.0 pg Final   MCHC 04/16/2021 34.5  30.0 - 36.0 g/dL Final   RDW 40/12/2723 12.8  11.5 - 15.5 % Final   Platelets 04/16/2021 213  150 - 400 K/uL Final   nRBC 04/16/2021 0.0  0.0 - 0.2 % Final   Neutrophils Relative % 04/16/2021 45  % Final   Neutro Abs 04/16/2021 2.6  1.7 - 7.7 K/uL Final   Lymphocytes Relative 04/16/2021 43  % Final   Lymphs Abs 04/16/2021 2.5  0.7 - 4.0 K/uL Final   Monocytes Relative 04/16/2021 8  % Final   Monocytes Absolute 04/16/2021 0.5  0.1 - 1.0 K/uL Final   Eosinophils Relative 04/16/2021 3  % Final   Eosinophils Absolute  04/16/2021 0.2  0.0 - 0.5 K/uL Final   Basophils Relative 04/16/2021 1  % Final   Basophils Absolute 04/16/2021 0.0  0.0 - 0.1 K/uL Final   Immature Granulocytes 04/16/2021 0  % Final   Abs Immature Granulocytes 04/16/2021 0.01  0.00 - 0.07 K/uL Final   Performed at [facility], [address] [place], [place] [address]   Glucose-Capillary 04/16/2021 270 (H)  70 - 99 mg/dL Final   Glucose reference range applies only to samples taken after fasting for at least 8 hours.   Glucose-Capillary 04/17/2021 410 (H)  70 - 99 mg/dL Final   Glucose reference range applies only to  samples taken after fasting for at least 8 hours.   Glucose-Capillary 04/17/2021 381 (H)  70 - 99 mg/dL Final   Glucose reference range applies only to samples taken after fasting for at least 8 hours.   Glucose-Capillary 04/17/2021 392 (H)  70 - 99 mg/dL Final   Glucose reference range applies only to samples taken after fasting for at least 8 hours.   Glucose-Capillary 04/17/2021 211 (H)  70 - 99 mg/dL Final   Glucose reference range applies only to samples taken after fasting for at least 8 hours.   Glucose-Capillary 04/18/2021 69 (L)  70 - 99 mg/dL Final   Glucose reference range applies only to samples taken after fasting for at least 8 hours.   Glucose-Capillary 04/18/2021 100 (H)  70 - 99 mg/dL Final   Glucose reference range applies only to samples taken after fasting for at least 8 hours.   Glucose-Capillary 04/18/2021 127 (H)  70 - 99 mg/dL Final   Glucose reference range applies only to samples taken after fasting for at least 8 hours.   Glucose-Capillary 04/18/2021 67 (L)  70 - 99 mg/dL Final   Glucose reference range applies only to samples taken after fasting for at least 8 hours.   Glucose-Capillary 04/18/2021 186 (H)  70 - 99 mg/dL Final   Glucose reference range applies only to samples taken after fasting for at least 8 hours.   Glucose-Capillary 04/18/2021 56 (L)  70 - 99 mg/dL Final   Glucose  reference range applies only to samples taken after fasting for at least 8 hours.   Glucose-Capillary 04/18/2021 142 (H)  70 - 99 mg/dL Final   Glucose reference range applies only to samples taken after fasting for at least 8 hours.   Glucose-Capillary 04/19/2021 82  70 - 99 mg/dL Final   Glucose reference range applies only to samples taken after fasting for at least 8 hours.   Glucose-Capillary 04/19/2021 69 (L)  70 - 99 mg/dL Final   Glucose reference range applies only to samples taken after fasting for at least 8 hours.   Glucose-Capillary 04/19/2021 210 (H)  70 - 99 mg/dL Final   Glucose reference range applies only to samples taken after fasting for at least 8 hours.   Glucose-Capillary 04/19/2021 243 (H)  70 - 99 mg/dL Final   Glucose reference range applies only to samples taken after fasting for at least 8 hours.   Glucose-Capillary 04/19/2021 200 (H)  70 - 99 mg/dL Final   Glucose reference range applies only to samples taken after fasting for at least 8 hours.  Admission on 04/16/2021, Discharged on 04/16/2021  Component Date Value Ref Range Status   SARS Coronavirus 2 by RT PCR 04/16/2021 NEGATIVE  NEGATIVE Final   Comment: (NOTE) SARS-CoV-2 target nucleic acids are NOT DETECTED.  The SARS-CoV-2 RNA is generally detectable in upper respiratory specimens during the acute phase of infection. The lowest concentration of SARS-CoV-2 viral copies this assay can detect is 138 copies/mL. A negative result does not preclude SARS-Cov-2 infection and should not be used as the sole basis for treatment or other patient management decisions. A negative result may occur with  improper specimen collection/handling, submission of specimen other than nasopharyngeal swab, presence of viral mutation(s) within the areas targeted by this assay, and inadequate number of viral copies(<138 copies/mL). A negative result must be combined with clinical observations, patient history, and  epidemiological information. The expected result is Negative.  Fact Sheet for Patients:  BloggerCourse.com  Fact Sheet for Healthcare Providers:  SeriousBroker.it  This test is no                          t yet approved or cleared by the [place] FDA and  has been authorized for detection and/or diagnosis of SARS-CoV-2 by FDA under an Emergency Use Authorization (EUA). This EUA will remain  in effect (meaning this test can be used) for the duration of the COVID-19 declaration under Section 564(b)(1) of the Act, 21 U.S.C.section 360bbb-3(b)(1), unless the authorization is terminated  or revoked sooner.       Influenza A by PCR 04/16/2021 NEGATIVE  NEGATIVE Final   Influenza B by PCR 04/16/2021 NEGATIVE  NEGATIVE Final   Comment: (NOTE) The Xpert Xpress SARS-CoV-2/FLU/RSV plus assay is intended as an aid in the diagnosis of influenza from Nasopharyngeal swab specimens and should not be used as a sole basis for treatment. Nasal washings and aspirates are unacceptable for Xpert Xpress SARS-CoV-2/FLU/RSV testing.  Fact Sheet for Patients: BloggerCourse.com  Fact Sheet for Healthcare Providers: SeriousBroker.it  This test is not yet approved or cleared by the [place] FDA and has been authorized for detection and/or diagnosis of SARS-CoV-2 by FDA under an Emergency Use Authorization (EUA). This EUA will remain in effect (meaning this test can be used) for the duration of the COVID-19 declaration under Section 564(b)(1) of the Act, 21 U.S.C. section 360bbb-3(b)(1), unless the authorization is terminated or revoked.  Performed at [facility], [address], [place], [place] [address]    POC Amphetamine UR 04/16/2021 None Detected  NONE DETECTED (Cut Off Level 1000 ng/mL) Final   POC Secobarbital (BAR) 04/16/2021 None Detected  NONE DETECTED (Cut Off Level 300  ng/mL) Final   POC Buprenorphine (BUP) 04/16/2021 None Detected  NONE DETECTED (Cut Off Level 10 ng/mL) Final   POC Oxazepam (BZO) 04/16/2021 None Detected  NONE DETECTED (Cut Off Level 300 ng/mL) Final   POC Cocaine UR 04/16/2021 None Detected  NONE DETECTED (Cut Off Level 300 ng/mL) Final   POC Methamphetamine UR 04/16/2021 None Detected  NONE DETECTED (Cut Off Level 1000 ng/mL) Final   POC Morphine 04/16/2021 None Detected  NONE DETECTED (Cut Off Level 300 ng/mL) Final   POC Oxycodone UR 04/16/2021 None Detected  NONE DETECTED (Cut Off Level 100 ng/mL) Final   POC Methadone UR 04/16/2021 None Detected  NONE DETECTED (Cut Off Level 300 ng/mL) Final   POC Marijuana UR 04/16/2021 None Detected  NONE DETECTED (Cut Off Level 50 ng/mL) Final   SARS Coronavirus 2 Ag 04/16/2021 Negative  Negative Final  No results displayed because visit has over 200 results.    Admission on 01/26/2021, Discharged on 01/28/2021  Component Date Value Ref Range Status   SARS Coronavirus 2 by RT PCR 01/26/2021 NEGATIVE  NEGATIVE Final   Comment: (NOTE) SARS-CoV-2 target nucleic acids are NOT DETECTED.  The SARS-CoV-2 RNA is generally detectable in upper respiratory specimens during the acute phase of infection. The lowest concentration of SARS-CoV-2 viral copies this assay can detect is 138 copies/mL. A negative result does not preclude SARS-Cov-2 infection and should not be used as the sole basis for treatment or other patient management decisions. A negative result may occur with  improper specimen collection/handling, submission of specimen other than nasopharyngeal swab, presence of viral mutation(s) within the areas targeted by this assay, and inadequate number of viral copies(<138 copies/mL). A negative result must be combined with  clinical observations, patient history, and epidemiological information. The expected result is Negative.  Fact Sheet for Patients:   BloggerCourse.comhttps://www.fda.gov/media/152166/download  Fact Sheet for Healthcare Providers:  SeriousBroker.ithttps://www.fda.gov/media/152162/download  This test is no                          t yet approved or cleared by the [place] FDA and  has been authorized for detection and/or diagnosis of SARS-CoV-2 by FDA under an Emergency Use Authorization (EUA). This EUA will remain  in effect (meaning this test can be used) for the duration of the COVID-19 declaration under Section 564(b)(1) of the Act, 21 U.S.C.section 360bbb-3(b)(1), unless the authorization is terminated  or revoked sooner.       Influenza A by PCR 01/26/2021 NEGATIVE  NEGATIVE Final   Influenza B by PCR 01/26/2021 NEGATIVE  NEGATIVE Final   Comment: (NOTE) The Xpert Xpress SARS-CoV-2/FLU/RSV plus assay is intended as an aid in the diagnosis of influenza from Nasopharyngeal swab specimens and should not be used as a sole basis for treatment. Nasal washings and aspirates are unacceptable for Xpert Xpress SARS-CoV-2/FLU/RSV testing.  Fact Sheet for Patients: BloggerCourse.comhttps://www.fda.gov/media/152166/download  Fact Sheet for Healthcare Providers: SeriousBroker.ithttps://www.fda.gov/media/152162/download  This test is not yet approved or cleared by the [place] FDA and has been authorized for detection and/or diagnosis of SARS-CoV-2 by FDA under an Emergency Use Authorization (EUA). This EUA will remain in effect (meaning this test can be used) for the duration of the COVID-19 declaration under Section 564(b)(1) of the Act, 21 U.S.C. section 360bbb-3(b)(1), unless the authorization is terminated or revoked.  Performed at [facility], [address] [place], [place] [address]    Sodium 01/26/2021 137  135 - 145 mmol/L Final   Potassium 01/26/2021 4.0  3.5 - 5.1 mmol/L Final   Chloride 01/26/2021 101  98 - 111 mmol/L Final   CO2 01/26/2021 25  22 - 32 mmol/L Final   Glucose, Bld 01/26/2021 269 (H)  70 - 99 mg/dL Final   Glucose reference  range applies only to samples taken after fasting for at least 8 hours.   BUN 01/26/2021 13  6 - 20 mg/dL Final   Creatinine, Ser 01/26/2021 1.45 (H)  0.61 - 1.24 mg/dL Final   Calcium 60/45/409810/31/2022 9.2  8.9 - 10.3 mg/dL Final   Total Protein 11/91/478210/31/2022 7.9  6.5 - 8.1 g/dL Final   Albumin 95/62/130810/31/2022 4.3  3.5 - 5.0 g/dL Final   AST 65/78/469610/31/2022 30  15 - 41 U/L Final   ALT 01/26/2021 26  0 - 44 U/L Final   Alkaline Phosphatase 01/26/2021 105  38 - 126 U/L Final   Total Bilirubin 01/26/2021 1.4 (H)  0.3 - 1.2 mg/dL Final   GFR, Estimated 01/26/2021 56 (L)  >60 mL/min Final   Comment: (NOTE) Calculated using the CKD-EPI Creatinine Equation (2021)    Anion gap 01/26/2021 11  5 - 15 Final   Performed at [facility], [address] [place], [place] [address]   Alcohol, Ethyl (B) 01/26/2021 <10  <10 mg/dL Final   Comment: (NOTE) Lowest detectable limit for serum alcohol is 10 mg/dL.  For medical purposes only. Performed at [facility], [address] [place], [place] [address]    WBC 01/26/2021 7.1  4.0 - 10.5 K/uL Final   RBC 01/26/2021 4.76  4.22 - 5.81 MIL/uL Final   Hemoglobin 01/26/2021 14.7  13.0 - 17.0 g/dL Final  HCT 01/26/2021 45.2  39.0 - 52.0 % Final   MCV 01/26/2021 95.0  80.0 - 100.0 fL Final   MCH 01/26/2021 30.9  26.0 - 34.0 pg Final   MCHC 01/26/2021 32.5  30.0 - 36.0 g/dL Final   RDW 16/12/9602 12.8  11.5 - 15.5 % Final   Platelets 01/26/2021 277  150 - 400 K/uL Final   nRBC 01/26/2021 0.0  0.0 - 0.2 % Final   Neutrophils Relative % 01/26/2021 46  % Final   Neutro Abs 01/26/2021 3.3  1.7 - 7.7 K/uL Final   Lymphocytes Relative 01/26/2021 39  % Final   Lymphs Abs 01/26/2021 2.7  0.7 - 4.0 K/uL Final   Monocytes Relative 01/26/2021 10  % Final   Monocytes Absolute 01/26/2021 0.7  0.1 - 1.0 K/uL Final   Eosinophils Relative 01/26/2021 4  % Final   Eosinophils Absolute 01/26/2021 0.3  0.0 - 0.5 K/uL Final   Basophils Relative 01/26/2021 1  % Final    Basophils Absolute 01/26/2021 0.1  0.0 - 0.1 K/uL Final   Immature Granulocytes 01/26/2021 0  % Final   Abs Immature Granulocytes 01/26/2021 0.01  0.00 - 0.07 K/uL Final   Performed at [facility], [address], [place], [place] [address]   Hgb A1c MFr Bld 01/26/2021 11.1 (H)  4.8 - 5.6 % Final   Comment: (NOTE) Pre diabetes:          5.7%-6.4%  Diabetes:              >6.4%  Glycemic control for   <7.0% adults with diabetes    Mean Plasma Glucose 01/26/2021 271.87  mg/dL Final   Performed at [facility], [address] [place], [place] [address]   Glucose-Capillary 01/26/2021 381 (H)  70 - 99 mg/dL Final   Glucose reference range applies only to samples taken after fasting for at least 8 hours.   Lithium Lvl 01/27/2021 0.08 (L)  0.60 - 1.20 mmol/L Final   Performed at [facility], [address], [place], [place] [address]   Glucose-Capillary 01/27/2021 400 (H)  70 - 99 mg/dL Final   Glucose reference range applies only to samples taken after fasting for at least 8 hours.   Glucose-Capillary 01/27/2021 308 (H)  70 - 99 mg/dL Final   Glucose reference range applies only to samples taken after fasting for at least 8 hours.   Glucose-Capillary 01/27/2021 113 (H)  70 - 99 mg/dL Final   Glucose reference range applies only to samples taken after fasting for at least 8 hours.   Glucose-Capillary 01/27/2021 306 (H)  70 - 99 mg/dL Final   Glucose reference range applies only to samples taken after fasting for at least 8 hours.   Glucose-Capillary 01/27/2021 191 (H)  70 - 99 mg/dL Final   Glucose reference range applies only to samples taken after fasting for at least 8 hours.   Glucose-Capillary 01/28/2021 246 (H)  70 - 99 mg/dL Final   Glucose reference range applies only to samples taken after fasting for at least 8 hours.   SARSCOV2ONAVIRUS 2 AG 01/28/2021 NEGATIVE  NEGATIVE Final   Comment: (NOTE) SARS-CoV-2 antigen NOT DETECTED.   Negative results are  presumptive.  Negative results do not preclude SARS-CoV-2 infection and should not be used as the sole basis for treatment or other patient management decisions, including infection  control decisions, particularly in the presence of clinical signs and  symptoms consistent with COVID-19,  or in those who have been in contact with the virus.  Negative results must be combined with clinical observations, patient history, and epidemiological information. The expected result is Negative.  Fact Sheet for Patients: https://www.jennings-kim.com/  Fact Sheet for Healthcare Providers: https://alexander-rogers.biz/  This test is not yet approved or cleared by the [place] FDA and  has been authorized for detection and/or diagnosis of SARS-CoV-2 by FDA under an Emergency Use Authorization (EUA).  This EUA will remain in effect (meaning this test can be used) for the duration of  the COV                          ID-19 declaration under Section 564(b)(1) of the Act, 21 U.S.C. section 360bbb-3(b)(1), unless the authorization is terminated or revoked sooner.     Glucose-Capillary 01/28/2021 281 (H)  70 - 99 mg/dL Final   Glucose reference range applies only to samples taken after fasting for at least 8 hours.  Admission on 01/21/2021, Discharged on 01/21/2021  Component Date Value Ref Range Status   Sodium 01/21/2021 134 (L)  135 - 145 mmol/L Final   Potassium 01/21/2021 4.1  3.5 - 5.1 mmol/L Final   Chloride 01/21/2021 97 (L)  98 - 111 mmol/L Final   CO2 01/21/2021 27  22 - 32 mmol/L Final   Glucose, Bld 01/21/2021 316 (H)  70 - 99 mg/dL Final   Glucose reference range applies only to samples taken after fasting for at least 8 hours.   BUN 01/21/2021 17  6 - 20 mg/dL Final   Creatinine, Ser 01/21/2021 1.39 (H)  0.61 - 1.24 mg/dL Final   Calcium 60/45/4098 9.5  8.9 - 10.3 mg/dL Final   Total Protein 11/91/4782 7.7  6.5 - 8.1 g/dL Final   Albumin 95/62/1308 4.0  3.5  - 5.0 g/dL Final   AST 65/78/4696 22  15 - 41 U/L Final   ALT 01/21/2021 24  0 - 44 U/L Final   Alkaline Phosphatase 01/21/2021 96  38 - 126 U/L Final   Total Bilirubin 01/21/2021 1.0  0.3 - 1.2 mg/dL Final   GFR, Estimated 01/21/2021 58 (L)  >60 mL/min Final   Comment: (NOTE) Calculated using the CKD-EPI Creatinine Equation (2021)    Anion gap 01/21/2021 10  5 - 15 Final   Performed at [facility], [address], [place], [place] [address]   Alcohol, Ethyl (B) 01/21/2021 <10  <10 mg/dL Final   Comment: (NOTE) Lowest detectable limit for serum alcohol is 10 mg/dL.  For medical purposes only. Performed at [facility], [address], [place], [place] [address]    Opiates 01/21/2021 NONE DETECTED  NONE DETECTED Final   Cocaine 01/21/2021 NONE DETECTED  NONE DETECTED Final   Benzodiazepines 01/21/2021 NONE DETECTED  NONE DETECTED Final   Amphetamines 01/21/2021 NONE DETECTED  NONE DETECTED Final   Tetrahydrocannabinol 01/21/2021 NONE DETECTED  NONE DETECTED Final   Barbiturates 01/21/2021 NONE DETECTED  NONE DETECTED Final   Comment: (NOTE) DRUG SCREEN FOR MEDICAL PURPOSES ONLY.  IF CONFIRMATION IS NEEDED FOR ANY PURPOSE, NOTIFY LAB WITHIN 5 DAYS.  LOWEST DETECTABLE LIMITS FOR URINE DRUG SCREEN Drug Class                     Cutoff (ng/mL) Amphetamine and metabolites    1000 Barbiturate and metabolites    200 Benzodiazepine  200 Tricyclics and metabolites     300 Opiates and metabolites        300 Cocaine and metabolites        300 THC                            50 Performed at [facility], [address] [place], [place] [address]    WBC 01/21/2021 5.9  4.0 - 10.5 K/uL Final   RBC 01/21/2021 4.93  4.22 - 5.81 MIL/uL Final   Hemoglobin 01/21/2021 15.4  13.0 - 17.0 g/dL Final   HCT 60/45/409810/26/2022 45.0  39.0 - 52.0 % Final   MCV 01/21/2021 91.3  80.0 - 100.0 fL Final   MCH 01/21/2021 31.2  26.0 - 34.0 pg Final   MCHC 01/21/2021 34.2  30.0  - 36.0 g/dL Final   RDW 11/91/478210/26/2022 13.0  11.5 - 15.5 % Final   Platelets 01/21/2021 271  150 - 400 K/uL Final   nRBC 01/21/2021 0.0  0.0 - 0.2 % Final   Neutrophils Relative % 01/21/2021 54  % Final   Neutro Abs 01/21/2021 3.2  1.7 - 7.7 K/uL Final   Lymphocytes Relative 01/21/2021 32  % Final   Lymphs Abs 01/21/2021 1.9  0.7 - 4.0 K/uL Final   Monocytes Relative 01/21/2021 9  % Final   Monocytes Absolute 01/21/2021 0.6  0.1 - 1.0 K/uL Final   Eosinophils Relative 01/21/2021 4  % Final   Eosinophils Absolute 01/21/2021 0.3  0.0 - 0.5 K/uL Final   Basophils Relative 01/21/2021 1  % Final   Basophils Absolute 01/21/2021 0.0  0.0 - 0.1 K/uL Final   Immature Granulocytes 01/21/2021 0  % Final   Abs Immature Granulocytes 01/21/2021 0.01  0.00 - 0.07 K/uL Final   Performed at [facility], [address] [place], [place] [address]   Lithium Lvl 01/21/2021 0.47 (L)  0.60 - 1.20 mmol/L Final   Performed at [facility], [address] [place], [place] [address]  Admission on 01/08/2021, Discharged on 01/08/2021  Component Date Value Ref Range Status   Glucose-Capillary 01/08/2021 405 (H)  70 - 99 mg/dL Final   Glucose reference range applies only to samples taken after fasting for at least 8 hours.   Sodium 01/08/2021 134 (L)  135 - 145 mmol/L Final   Potassium 01/08/2021 3.6  3.5 - 5.1 mmol/L Final   Chloride 01/08/2021 101  98 - 111 mmol/L Final   CO2 01/08/2021 25  22 - 32 mmol/L Final   Glucose, Bld 01/08/2021 415 (H)  70 - 99 mg/dL Final   Glucose reference range applies only to samples taken after fasting for at least 8 hours.   BUN 01/08/2021 12  6 - 20 mg/dL Final   Creatinine, Ser 01/08/2021 1.00  0.61 - 1.24 mg/dL Final   Calcium 78/46/962910/13/2022 8.0 (L)  8.9 - 10.3 mg/dL Final   GFR, Estimated 01/08/2021 >60  >60 mL/min Final   Comment: (NOTE) Calculated using the CKD-EPI Creatinine Equation (2021)    Anion gap 01/08/2021 8  5 - 15 Final   Performed at [facility], [address] [place], [place] [address]   WBC 01/08/2021 4.1  4.0 - 10.5 K/uL Final   RBC 01/08/2021 4.45  4.22 - 5.81 MIL/uL Final   Hemoglobin 01/08/2021 13.8  13.0 - 17.0 g/dL Final   HCT 32/44/010210/13/2022 40.7  39.0 - 52.0 %  Final   MCV 01/08/2021 91.5  80.0 - 100.0 fL Final   MCH 01/08/2021 31.0  26.0 - 34.0 pg Final   MCHC 01/08/2021 33.9  30.0 - 36.0 g/dL Final   RDW 47/82/9562 12.8  11.5 - 15.5 % Final   Platelets 01/08/2021 227  150 - 400 K/uL Final   nRBC 01/08/2021 0.0  0.0 - 0.2 % Final   Performed at [facility], [name] [place], [place] [address]   Color, Urine 01/08/2021 STRAW (A)  YELLOW Final   APPearance 01/08/2021 CLEAR  CLEAR Final   Specific Gravity, Urine 01/08/2021 1.015  1.005 - 1.030 Final   pH 01/08/2021 7.0  5.0 - 8.0 Final   Glucose, UA 01/08/2021 >=500 (A)  NEGATIVE mg/dL Final   Hgb urine dipstick 01/08/2021 NEGATIVE  NEGATIVE Final   Bilirubin Urine 01/08/2021 NEGATIVE  NEGATIVE Final   Ketones, ur 01/08/2021 5 (A)  NEGATIVE mg/dL Final   Protein, ur 57/84/6962 NEGATIVE  NEGATIVE mg/dL Final   Nitrite 95/28/4132 NEGATIVE  NEGATIVE Final   Leukocytes,Ua 01/08/2021 NEGATIVE  NEGATIVE Final   RBC / HPF 01/08/2021 0-5  0 - 5 RBC/hpf Final   WBC, UA 01/08/2021 0-5  0 - 5 WBC/hpf Final   Bacteria, UA 01/08/2021 NONE SEEN  NONE SEEN Final   Performed at [facility], [address], [place], [place] [address]   Glucose-Capillary 01/08/2021 310 (H)  70 - 99 mg/dL Final   Glucose reference range applies only to samples taken after fasting for at least 8 hours.   Lithium Lvl 01/08/2021 <0.06 (L)  0.60 - 1.20 mmol/L Final   Performed at [facility], [address], [place], [place] [address]   Glucose-Capillary 01/08/2021 387 (H)  70 - 99 mg/dL Final   Glucose reference range applies only to samples taken after fasting for at least 8 hours.    Blood Alcohol level:  Lab Results   Component Value Date   ETH <10 04/16/2021   ETH <10 01/26/2021    Metabolic Disorder Labs: Lab Results  Component Value Date   HGBA1C 11.1 (H) 01/26/2021   MPG 271.87 01/26/2021   MPG 202.99 01/07/2019   No results found for: PROLACTIN Lab Results  Component Value Date   CHOL 140 01/30/2021   TRIG 95 01/30/2021   HDL 49 01/30/2021   CHOLHDL 2.9 01/30/2021   VLDL 19 01/30/2021   LDLCALC 72 01/30/2021   LDLCALC 67 05/20/2020    Therapeutic Lab Levels: Lab Results  Component Value Date   LITHIUM 1.09 04/21/2021   LITHIUM 0.32 (L) 04/16/2021   Lab Results  Component Value Date   VALPROATE <10.0 (L) 03/15/2011   VALPROATE 41.9 (L) 03/03/2011   No components found for:  CBMZ  Physical Findings   AIMS    Flowsheet Row [facility]) [facility] Ad[facility] (Discharged) from 07/18/2018 in [facility]AL MEDICINE  AIMS Total Score 0 0      AUDIT    Flowsheet Row [facility]) [facility] Ad[facility] (Discharged) [facility] 07[facility]18/2018 in [facility]  Alcohol Use Disorder Identification Test Final Score (AUDIT) 0 8      PHQ2-9    Flowsheet Row ED from 04/16/2021 in [facility] Office Visit from 12/30/2015 in [facility] at [facility] Total Score 0 0  PHQ-9 Total Score 0 --  Flowsheet Row ED from 04/19/2021 in [facility] Most recent reading at 04/19/2021 10:59 PM ED from 04/16/2021 in [facility] EMERGENCY DEPARTMENT Most recent reading at 04/19/2021 11:17 AM ED from 04/16/2021 in [facility] Most recent reading at 04/16/2021  6:06 PM  C-SSRS RISK CATEGORY No Risk No Risk No Risk        Musculoskeletal  Strength & Muscle Tone: within normal limits Gait & Station: normal Patient leans: N/A  Psychiatric  Specialty Exam  Presentation  General Appearance: Casual; Fairly Groomed  Eye Contact:Fair  Speech:Pressured; Clear and Coherent  Speech Volume:Normal  Handedness:Right   Mood and Affect  Mood:Anxious; Labile  Affect:Labile   Thought Process  Thought Processes:Coherent; Goal Directed  Descriptions of Associations:Tangential  Orientation:Full (Time, Place and Person)  Thought Content:Tangential  Diagnosis of Schizophrenia or Schizoaffective disorder in past: Yes  Duration of Psychotic Symptoms: Greater than six months   Hallucinations:Hallucinations: None  Ideas of Reference:None  Suicidal Thoughts:Suicidal Thoughts: No  Homicidal Thoughts:Homicidal Thoughts: No   Sensorium  Memory:Immediate Fair; Recent Poor  Judgment:Impaired  Insight:Lacking   Executive Functions  Concentration:Poor  Attention Span:Fair  Recall:Fair  Fund of Knowledge:Fair  Language:Fair   Psychomotor Activity  Psychomotor Activity:Psychomotor Activity: Normal   Assets  Assets:Housing; Leisure Time; Physical Health; Resilience; Social Support   Sleep  Sleep:Sleep: Good   No data recorded  Physical Exam  Physical Exam Vitals and nursing note reviewed.  Constitutional:      Appearance: Normal appearance. He is well-developed.  HENT:     Head: Normocephalic and atraumatic.     Nose: Nose normal.  Cardiovascular:     Rate and Rhythm: Normal rate.  Pulmonary:     Effort: Pulmonary effort is normal.  Musculoskeletal:        General: Normal range of motion.     Cervical back: Normal range of motion.  Skin:    General: Skin is warm and dry.  Neurological:     Mental Status: He is alert and oriented to person, place, and time.  Psychiatric:        Attention and Perception: Attention normal.        Mood and Affect: Mood is anxious. Affect is labile.        Speech: Speech is rapid and pressured and tangential.        Behavior: Behavior is cooperative.         Thought Content: Thought content is paranoid.   Review of Systems  Constitutional: Negative.   HENT: Negative.    Eyes: Negative.   Respiratory: Negative.    Cardiovascular: Negative.   Gastrointestinal: Negative.   Genitourinary: Negative.   Musculoskeletal: Negative.   Skin: Negative.   Neurological: Negative.   Endo/Heme/Allergies: Negative.   Psychiatric/Behavioral:  The patient is nervous/anxious.   Blood pressure 138/88, pulse 79, temperature 98.3 F (36.8 C), temperature source Oral, resp. rate 18, SpO2 99 %. There is no height or weight on file to calculate BMI.  Treatment Plan Summary: Daily contact with patient to assess and evaluate symptoms and progress in treatment Patient reviewed with Dr. Bronwen Betters.  Remains under involuntary commitment petition.  Continue to seek inpatient psychiatric hospitalization.  Lenard Lance, FNP 04/22/2021 4:45 PM

## 2021-04-22 NOTE — ED Notes (Signed)
Pt pacing room talking to self and singing. Pleasant and polite with staff. Pt observed responding to internal stimuli but pt denies hallucinations, SI/HI. Pt asking for 4 sugar packs to put in H2O but RN provided education on dietary restrictions as pt is diabetic. Pt walked away singing. Will continue to monitor for safety.

## 2021-04-22 NOTE — ED Notes (Signed)
Pt pacing room. Denies concerns. Able to be redirected to loud noise and singing while peers are resting. Will continue to monitor for safety.

## 2021-04-22 NOTE — ED Notes (Signed)
Pt sleeping@this time. Breathing even and unlabored. Will continue to monitor for safety 

## 2021-04-22 NOTE — ED Notes (Signed)
Pt eating dinner in no acute distress. Safety maintained. 

## 2021-04-23 ENCOUNTER — Inpatient Hospital Stay (HOSPITAL_COMMUNITY)
Admission: AD | Admit: 2021-04-23 | Discharge: 2021-05-27 | DRG: 885 | Payer: Medicare Other | Source: Intra-hospital | Attending: Emergency Medicine | Admitting: Emergency Medicine

## 2021-04-23 DIAGNOSIS — H547 Unspecified visual loss: Secondary | ICD-10-CM | POA: Diagnosis present

## 2021-04-23 DIAGNOSIS — F259 Schizoaffective disorder, unspecified: Principal | ICD-10-CM | POA: Diagnosis present

## 2021-04-23 DIAGNOSIS — R7401 Elevation of levels of liver transaminase levels: Secondary | ICD-10-CM | POA: Diagnosis present

## 2021-04-23 DIAGNOSIS — R7989 Other specified abnormal findings of blood chemistry: Secondary | ICD-10-CM | POA: Diagnosis present

## 2021-04-23 DIAGNOSIS — R471 Dysarthria and anarthria: Secondary | ICD-10-CM | POA: Diagnosis present

## 2021-04-23 DIAGNOSIS — I10 Essential (primary) hypertension: Secondary | ICD-10-CM | POA: Diagnosis present

## 2021-04-23 DIAGNOSIS — I69354 Hemiplegia and hemiparesis following cerebral infarction affecting left non-dominant side: Secondary | ICD-10-CM

## 2021-04-23 DIAGNOSIS — E1165 Type 2 diabetes mellitus with hyperglycemia: Secondary | ICD-10-CM | POA: Diagnosis present

## 2021-04-23 DIAGNOSIS — F250 Schizoaffective disorder, bipolar type: Secondary | ICD-10-CM | POA: Diagnosis present

## 2021-04-23 DIAGNOSIS — Z794 Long term (current) use of insulin: Secondary | ICD-10-CM | POA: Diagnosis not present

## 2021-04-23 DIAGNOSIS — F209 Schizophrenia, unspecified: Secondary | ICD-10-CM | POA: Diagnosis present

## 2021-04-23 DIAGNOSIS — E119 Type 2 diabetes mellitus without complications: Secondary | ICD-10-CM

## 2021-04-23 DIAGNOSIS — D649 Anemia, unspecified: Secondary | ICD-10-CM | POA: Diagnosis not present

## 2021-04-23 DIAGNOSIS — Z7982 Long term (current) use of aspirin: Secondary | ICD-10-CM | POA: Diagnosis not present

## 2021-04-23 DIAGNOSIS — Z7984 Long term (current) use of oral hypoglycemic drugs: Secondary | ICD-10-CM | POA: Diagnosis not present

## 2021-04-23 DIAGNOSIS — J302 Other seasonal allergic rhinitis: Secondary | ICD-10-CM | POA: Diagnosis not present

## 2021-04-23 DIAGNOSIS — Z111 Encounter for screening for respiratory tuberculosis: Secondary | ICD-10-CM

## 2021-04-23 DIAGNOSIS — K5900 Constipation, unspecified: Secondary | ICD-10-CM | POA: Diagnosis present

## 2021-04-23 DIAGNOSIS — Z79899 Other long term (current) drug therapy: Secondary | ICD-10-CM | POA: Diagnosis not present

## 2021-04-23 DIAGNOSIS — F0280 Dementia in other diseases classified elsewhere without behavioral disturbance: Secondary | ICD-10-CM | POA: Diagnosis present

## 2021-04-23 DIAGNOSIS — Z9114 Patient's other noncompliance with medication regimen: Secondary | ICD-10-CM | POA: Diagnosis not present

## 2021-04-23 LAB — GLUCOSE, CAPILLARY
Glucose-Capillary: 251 mg/dL — ABNORMAL HIGH (ref 70–99)
Glucose-Capillary: 52 mg/dL — ABNORMAL LOW (ref 70–99)
Glucose-Capillary: 127 mg/dL — ABNORMAL HIGH (ref 70–99)
Glucose-Capillary: 163 mg/dL — ABNORMAL HIGH (ref 70–99)
Glucose-Capillary: 199 mg/dL — ABNORMAL HIGH (ref 70–99)
Glucose-Capillary: 305 mg/dL — ABNORMAL HIGH (ref 70–99)
Glucose-Capillary: 168 mg/dL — ABNORMAL HIGH (ref 70–99)

## 2021-04-23 LAB — POC SARS CORONAVIRUS 2 AG: SARSCOV2ONAVIRUS 2 AG: NEGATIVE

## 2021-04-23 MED ORDER — LITHIUM CARBONATE ER 450 MG PO TBCR
450.0000 mg | EXTENDED_RELEASE_TABLET | Freq: Two times a day (BID) | ORAL | Status: DC
  Administered 2021-04-24 – 2021-05-27 (×67): 450 mg via ORAL
  Filled 2021-04-23 (×72): qty 1

## 2021-04-23 MED ORDER — LISINOPRIL 40 MG PO TABS
40.0000 mg | ORAL_TABLET | Freq: Every day | ORAL | Status: DC
  Filled 2021-04-23 (×2): qty 1

## 2021-04-23 MED ORDER — MAGNESIUM HYDROXIDE 400 MG/5ML PO SUSP: 30.0000 mL | Freq: Every day | ORAL | Status: DC | PRN

## 2021-04-23 MED ORDER — HYDROXYZINE HCL 25 MG PO TABS
25.0000 mg | ORAL_TABLET | Freq: Three times a day (TID) | ORAL | Status: DC | PRN
  Administered 2021-04-23 – 2021-04-29 (×4): 25 mg via ORAL
  Filled 2021-04-23 (×4): qty 1

## 2021-04-23 MED ORDER — CLONIDINE HCL 0.2 MG PO TABS
0.2000 mg | ORAL_TABLET | Freq: Two times a day (BID) | ORAL | Status: DC
  Administered 2021-04-24 – 2021-05-27 (×67): 0.2 mg via ORAL
  Filled 2021-04-23 (×70): qty 1

## 2021-04-23 MED ORDER — INSULIN ASPART PROT & ASPART (70-30 MIX) 100 UNIT/ML ~~LOC~~ SUSP: 42.0000 [IU] | Freq: Every day | SUBCUTANEOUS | 11 refills | Status: DC

## 2021-04-23 MED ORDER — BENZTROPINE MESYLATE 1 MG PO TABS
1.0000 mg | ORAL_TABLET | Freq: Two times a day (BID) | ORAL | Status: DC
  Filled 2021-04-23 (×4): qty 1

## 2021-04-23 MED ORDER — INSULIN ASPART 100 UNIT/ML IJ SOLN: 0.0000 [IU] | Freq: Three times a day (TID) | INTRAMUSCULAR | 11 refills | Status: DC

## 2021-04-23 MED ORDER — ACETAMINOPHEN 325 MG PO TABS
650.0000 mg | ORAL_TABLET | Freq: Four times a day (QID) | ORAL | Status: DC | PRN
  Administered 2021-05-25: 650 mg via ORAL
  Filled 2021-04-23: qty 2

## 2021-04-23 MED ORDER — ASPIRIN EC 81 MG PO TBEC
81.0000 mg | DELAYED_RELEASE_TABLET | Freq: Every day | ORAL | Status: DC
  Administered 2021-04-24 – 2021-05-27 (×34): 81 mg via ORAL
  Filled 2021-04-23 (×41): qty 1

## 2021-04-23 MED ORDER — INSULIN ASPART 100 UNIT/ML IJ SOLN
0.0000 [IU] | Freq: Three times a day (TID) | INTRAMUSCULAR | Status: DC
  Administered 2021-04-24: 1 [IU] via SUBCUTANEOUS

## 2021-04-23 MED ORDER — ALUM & MAG HYDROXIDE-SIMETH 200-200-20 MG/5ML PO SUSP: 30.0000 mL | ORAL | Status: DC | PRN

## 2021-04-23 MED ORDER — HALOPERIDOL 5 MG PO TABS
20.0000 mg | ORAL_TABLET | Freq: Every day | ORAL | Status: DC
  Administered 2021-04-24 – 2021-04-27 (×4): 20 mg via ORAL
  Filled 2021-04-23 (×8): qty 4

## 2021-04-23 MED ORDER — AMLODIPINE BESYLATE 10 MG PO TABS
10.0000 mg | ORAL_TABLET | Freq: Every day | ORAL | Status: DC
  Filled 2021-04-23 (×2): qty 1

## 2021-04-23 MED ORDER — ATENOLOL 12.5 MG HALF TABLET
12.5000 mg | ORAL_TABLET | Freq: Every day | ORAL | Status: DC
  Administered 2021-04-24 – 2021-05-27 (×34): 12.5 mg via ORAL
  Filled 2021-04-23 (×35): qty 1

## 2021-04-23 MED ORDER — TRAZODONE HCL 50 MG PO TABS: 50.0000 mg | ORAL_TABLET | Freq: Every evening | ORAL | Status: DC | PRN

## 2021-04-23 MED ORDER — CLOZAPINE 25 MG PO TABS
50.0000 mg | ORAL_TABLET | Freq: Two times a day (BID) | ORAL | Status: DC
  Administered 2021-04-24 – 2021-04-28 (×10): 50 mg via ORAL
  Filled 2021-04-23 (×14): qty 2

## 2021-04-23 MED ORDER — INSULIN ASPART PROT & ASPART (70-30 MIX) 100 UNIT/ML ~~LOC~~ SUSP: 42.0000 [IU] | Freq: Every day | SUBCUTANEOUS | Status: DC

## 2021-04-23 MED ORDER — TRAZODONE HCL 50 MG PO TABS
50.0000 mg | ORAL_TABLET | Freq: Every evening | ORAL | Status: DC | PRN
  Administered 2021-04-23 – 2021-05-07 (×14): 50 mg via ORAL
  Filled 2021-04-23 (×12): qty 1

## 2021-04-23 NOTE — ED Notes (Signed)
Glucose is reading 168

## 2021-04-23 NOTE — Discharge Instructions (Signed)
Transfer to [facility] 

## 2021-04-23 NOTE — ED Notes (Signed)
Patient received  7 units of novolog and scheduled 42 units of 70/30 insulin.  Last blood glucose was 305

## 2021-04-23 NOTE — Progress Notes (Signed)
Patient received breakfast. 

## 2021-04-23 NOTE — ED Provider Notes (Signed)
FBC/OBS ASAP Discharge Summary  Date and Time: 04/23/2021 10:16 PM  Name: Mitchell [name]  MRN:  [id number]   Discharge Diagnoses:  Final diagnoses:  Schizoaffective disorder, bipolar type [facility](HCC)    [facility].  Mitchell FoyDavid H Helsley is a 61 year old male with psychiatric history of schizoaffective disorder bipolar type.  Patient was received from [facility] post treatment for hyperglycemia and medical clearance.  Patient is under involuntary commitment.   Home medications were continued on admission.   Past Medical History:  Past Medical History:  Diagnosis Date   Bipolar affective disorder (HCC)    takes Zyprexa daily   Diabetes mellitus    takes Victoza,Metformin,and Glipizide daily   Hypertension    takes Amlodipine,Lisinopril and Clonidine daily   Hyponatremia    history of   Mental disorder    takes Lithium daily   Schizoaffective disorder    takes Trazodone nightly   Seasonal allergies    takes Claritin daily   Sleep apnea    sleep study >117yrs ago   Stroke [facility](HCC)    left arm weakness    Past Surgical History:  Procedure Laterality Date   CATARACT EXTRACTION W/PHACO Right 02/14/2013   Procedure: CATARACT EXTRACTION PHACO AND INTRAOCULAR LENS PLACEMENT (IOC);  Surgeon: Shade FloodGreer Geiger, MD;  [facility];  Service: Ophthalmology;  Laterality: Right;   CATARACT EXTRACTION W/PHACO Left 06/13/2013   Procedure: CATARACT EXTRACTION PHACO AND INTRAOCULAR LENS PLACEMENT (IOC);  Surgeon: Shade FloodGreer Geiger, MD;  [facility];  Service: Ophthalmology;  Laterality: Left;   CIRCUMCISION  20 yrs. ago   EYE SURGERY     Family History: No family history on file.  Social History:  Social History   Substance and Sexual Activity  Alcohol Use Not Currently     Social History   Substance and Sexual Activity  Drug Use No    Social History   Socioeconomic History   Marital status: Divorced    Spouse name: Not on file   Number of children: Not  on file   Years of education: Not on file   Highest education level: Not on file  Occupational History   Not on file  Tobacco Use   Smoking status: Never   Smokeless tobacco: Never  Vaping Use   Vaping Use: Never used  Substance and Sexual Activity   Alcohol use: Not Currently   Drug use: No   Sexual activity: Yes    Birth control/protection: None  Other Topics Concern   Not on file  Social History Narrative   Not on file   Social Determinants of Health   Financial Resource Strain: Not on file  Food Insecurity: Not on file  Transportation Needs: Not on file  Physical Activity: Not on file  Stress: Not on file  Social Connections: Not on file   SDOH:  SDOH Screenings   Alcohol Screen: Low Risk    Last Alcohol Screening Score (AUDIT): 0  Depression (PHQ2-9): Low Risk    PHQ-2 Score: 0  Financial Resource Strain: Not on file  Food Insecurity: Not on file  Housing: Not on file  Physical Activity: Not on file  Social Connections: Not on file  Stress: Not on file  Tobacco Use: Low Risk    Smoking Tobacco Use: Never   Smokeless Tobacco Use: Never   Passive Exposure: Not on file  Transportation Needs: Not on file    Tobacco Cessation:  Prescription not provided because: transferred to inpatient facility.  Current Medications:  Current Facility-Administered Medications  Medication Dose Route Frequency Provider Last Rate Last Admin   acetaminophen (TYLENOL) tablet 650 mg  650 mg Oral Q6H PRN Ajibola, Ene A, NP       alum & mag hydroxide-simeth (MAALOX/MYLANTA) 200-200-20 MG/5ML suspension 30 mL  30 mL Oral Q4H PRN Ajibola, Ene A, NP       amLODipine (NORVASC) tablet 10 mg  10 mg Oral Daily Ajibola, Ene A, NP   10 mg at 04/23/21 1048   atenolol (TENORMIN) tablet 12.5 mg  12.5 mg Oral Daily Ajibola, Ene A, NP   12.5 mg at 04/23/21 1048   benztropine (COGENTIN) tablet 1 mg  1 mg Oral BID Ajibola, Ene A, NP   1 mg at 04/23/21 2127   cloNIDine (CATAPRES) tablet 0.2 mg  0.2  mg Oral BID Ajibola, Ene A, NP   0.2 mg at 04/23/21 2128   cloZAPine (CLOZARIL) tablet 50 mg  50 mg Oral BID Ajibola, Ene A, NP   50 mg at 04/23/21 2123   haloperidol (HALDOL) tablet 20 mg  20 mg Oral QHS Ajibola, Ene A, NP   20 mg at 04/23/21 2123   hydrOXYzine (ATARAX) tablet 25 mg  25 mg Oral TID PRN Ajibola, Ene A, NP   25 mg at 04/22/21 2133   insulin aspart (novoLOG) injection 0-9 Units  0-9 Units Subcutaneous TID WC Ajibola, Ene A, NP   7 Units at 04/23/21 1725   insulin aspart protamine- aspart (NOVOLOG MIX 70/30) injection 42 Units  42 Units Subcutaneous Q supper Ajibola, Ene A, NP   42 Units at 04/23/21 1726   lisinopril (ZESTRIL) tablet 40 mg  40 mg Oral Daily Ajibola, Ene A, NP   40 mg at 04/23/21 1048   lithium carbonate (ESKALITH) CR tablet 450 mg  450 mg Oral Q12H Ajibola, Ene A, NP   450 mg at 04/23/21 2131   magnesium hydroxide (MILK OF MAGNESIA) suspension 30 mL  30 mL Oral Daily PRN Ajibola, Ene A, NP       traZODone (DESYREL) tablet 50 mg  50 mg Oral QHS PRN Ajibola, Ene A, NP       traZODone (DESYREL) tablet 50 mg  50 mg Oral QHS Ajibola, Ene A, NP   50 mg at 04/22/21 2132   Current Outpatient Medications  Medication Sig Dispense Refill   amLODipine (NORVASC) 10 MG tablet Take 1 tablet (10 mg total) by mouth daily. (Patient not taking: Reported on 04/16/2021) 90 tablet 1   aspirin 81 MG EC tablet Take 1 tablet (81 mg total) by mouth daily. Swallow whole. (Patient not taking: Reported on 04/16/2021) 30 tablet 12   atenolol (TENORMIN) 25 MG tablet Take 0.5 tablets (12.5 mg total) by mouth daily. (Patient not taking: Reported on 04/16/2021) 15 tablet 0   benztropine (COGENTIN) 1 MG tablet Take 1 tablet (1 mg total) by mouth 2 (two) times daily. 60 tablet 2   cloNIDine (CATAPRES) 0.2 MG tablet Take 0.2 mg by mouth 2 (two) times daily. (Patient not taking: Reported on 01/26/2021)     cloZAPine (CLOZARIL) 50 MG tablet Take 1 tablet (50 mg total) by mouth 2 (two) times daily. 60 tablet  0   glipiZIDE (GLUCOTROL XL) 10 MG 24 hr tablet Take 2 tablets (20 mg total) by mouth daily with breakfast. 60 tablet 0   haloperidol (HALDOL) 20 MG tablet Take 1 tablet (20 mg total) by mouth at bedtime. (Patient not taking: Reported on 01/26/2021) 90  tablet 1   insulin aspart (NOVOLOG) 100 UNIT/ML injection Inject 0-9 Units into the skin 3 (three) times daily with meals. 10 mL 11   insulin aspart protamine - aspart (NOVOLOG MIX 70/30 FLEXPEN) (70-30) 100 UNIT/ML FlexPen Inject 48 Units into the skin daily with breakfast. 15 mL 0   insulin aspart protamine- aspart (NOVOLOG MIX 70/30) (70-30) 100 UNIT/ML injection Inject 0.42 mLs (42 Units total) into the skin daily with supper. 10 mL 11   lisinopril (ZESTRIL) 40 MG tablet Take 1 tablet (40 mg total) by mouth daily. (Patient not taking: Reported on 01/11/2020) 90 tablet 1   lithium carbonate (ESKALITH) 450 MG CR tablet Take 1 tablet (450 mg total) by mouth every 12 (twelve) hours. (Patient not taking: Reported on 01/26/2021) 60 tablet 2   traZODone (DESYREL) 50 MG tablet Take 1 tablet (50 mg total) by mouth at bedtime as needed for sleep.      PTA Medications: (Not in a hospital admission)   Musculoskeletal  Strength & Muscle Tone: within normal limits Gait & Station: normal Patient leans: N/A  Psychiatric Specialty Exam  Presentation  General Appearance: Casual; Fairly Groomed  Eye Contact:Fair  Speech:Pressured; Clear and Coherent  Speech Volume:Normal  Handedness:Right   Mood and Affect  Mood:Anxious; Labile  Affect:Labile   Thought Process  Thought Processes:Coherent; Goal Directed  Descriptions of Associations:Tangential  Orientation:Full (Time, Place and Person)  Thought Content:Tangential  Diagnosis of Schizophrenia or Schizoaffective disorder in past: Yes  Duration of Psychotic Symptoms: Greater than six months   Hallucinations:Hallucinations: None  Ideas of Reference:None  Suicidal Thoughts:Suicidal  Thoughts: No  Homicidal Thoughts:Homicidal Thoughts: No   Sensorium  Memory:Immediate Fair; Recent Poor  Judgment:Impaired  Insight:Lacking   Executive Functions  Concentration:Poor  Attention Span:Fair  Recall:Fair  Fund of Knowledge:Fair  Language:Fair   Psychomotor Activity  Psychomotor Activity:Psychomotor Activity: Normal   Assets  Assets:Housing; Leisure Time; Physical Health; Resilience; Social Support   Sleep  Sleep:Sleep: Good   No data recorded  Physical Exam  Physical Exam Constitutional:      General: He is not in acute distress.    Appearance: He is not ill-appearing, toxic-appearing or diaphoretic.  HENT:     Head: Normocephalic.     Right Ear: External ear normal.     Left Ear: External ear normal.  Pulmonary:     Effort: Pulmonary effort is normal. No respiratory distress.  Musculoskeletal:        General: Normal range of motion.  Neurological:     Mental Status: He is alert and oriented to person, place, and time.  Psychiatric:        Speech: Speech normal.        Behavior: Behavior is cooperative.   Review of Systems  Constitutional:  Negative for diaphoresis.  Respiratory:  Negative for cough and shortness of breath.   Cardiovascular:  Negative for chest pain.  Gastrointestinal:  Negative for diarrhea, nausea and vomiting.  Neurological:  Negative for dizziness.   Blood pressure 120/78, pulse 89, temperature 98.2 F (36.8 C), resp. rate 18, SpO2 100 %. There is no height or weight on file to calculate BMI.    Disposition: Patient transferred to [facility] for inpatient psychiatric treatment.  Jackelyn Poling, NP 04/23/2021, 10:16 PM

## 2021-04-23 NOTE — ED Notes (Signed)
Pt sleeping@this  time. Breathing eben anlearned
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
## 2021-04-23 NOTE — ED Notes (Signed)
Oriented x3 , guarded and suspicious with brief responses to questions asked , observed laughing and talking to self , does reports having voices in his head , Denies SI / HI will continue to monitor for safety

## 2021-04-23 NOTE — Progress Notes (Signed)
Patient received 2 units of novo log insulin after 8:05 am glucose value of 163. Insulin given in left arm and tolerated well. Nursing staff will continue to monitor.

## 2021-04-23 NOTE — ED Notes (Signed)
Morning CBG 52.  Pt eating a Chicken Salad Sandwich and drinking 2 Grape Juice at present.  Pt denies feeling symptomatic at present.  Skin warm & dry, awake, alert & responsive.  NP Nira Conn notified.  Pending repeat CBG in 30 minutes.

## 2021-04-23 NOTE — ED Notes (Signed)
2nd morning CBG:127

## 2021-04-23 NOTE — Tx Team (Signed)
Initial Treatment Plan 04/23/2021 11:48 PM MCKENZY EALES [id number]    PATIENT STRESSORS: Marital or family conflict   Medication change or noncompliance     PATIENT STRENGTHS: General fund of knowledge  Motivation for treatment/growth    PATIENT IDENTIFIED PROBLEMS: Blood sugar regulation  psychosis  Medication compliance  "Math problems, reading a little, get to know the people, I want Lucila Maine as my primary care"               DISCHARGE CRITERIA:  Improved stabilization in mood, thinking, and/or behavior Verbal commitment to aftercare and medication compliance  PRELIMINARY DISCHARGE PLAN: Attend aftercare/continuing care group Attend 12-step recovery group Outpatient therapy  PATIENT/FAMILY INVOLVEMENT: This treatment plan has been presented to and reviewed with the patient, [name].  The patient and family have been given the opportunity to ask questions and make suggestions.  Providence Crosby, RN 04/23/2021, 11:48 PM

## 2021-04-23 NOTE — Progress Notes (Signed)
Inpatient Diabetes Program Recommendations  AACE/ADA: New Consensus Statement on Inpatient Glycemic Control (2015)  Target Ranges:  Prepandial:   less than 140 mg/dL      Peak postprandial:   less than 180 mg/dL (1-2 hours)      Critically ill patients:  140 - 180 mg/dL   Lab Results  Component Value Date   GLUCAP 199 (H) 04/23/2021   HGBA1C 11.1 (H) 01/26/2021    Review of Glycemic Control  Latest Reference Range & Units 04/22/21 08:04 04/22/21 12:36 04/22/21 17:02 04/23/21 06:24 04/23/21 07:06 04/23/21 08:10 04/23/21 12:18  Glucose-Capillary 70 - 99 mg/dL 830 (H) 940 (H) 768 (H) 52 (L) 127 (H) 163 (H) 199 (H)  (H): Data is abnormally high (L): Data is abnormally low  Diabetes history: DM2 Outpatient Diabetes medications: 70/30 48 units with BF and 42 with supper,  Glipizide 20 qd Current orders for Inpatient glycemic control: 70/30 42 with supper, Novolog 0-9 units TID  Inpatient Diabetes Program Recommendations:    70/30 24 unit with breakfast and 21 unit with supper  Will continue to follow while inpatient.  Thank you, Dulce Sellar, MSN, RN Diabetes Coordinator Inpatient Diabetes Program [phone] (team pager from 8a-5p)

## 2021-04-23 NOTE — Progress Notes (Signed)
Patient resting, eyes closed and respiration are even an unlabored at this time. Staff will continue to monitor.

## 2021-04-23 NOTE — ED Notes (Signed)
[employer] called for transport 

## 2021-04-23 NOTE — Progress Notes (Signed)
Patient has been denied by [facility] due to no beds available. Patient meets BH inpatient criteria per Vernard Gambles, NP. Patient has been faxed out to the following facilities:    [facility]  [address] [place] [address] [phone] --  [facility] [address]  [address], [place] [address] [phone] --  [facility]  [address] [place] [address] [phone] --  [facility]  [address], [place] [address] [phone] --  [facility]  [address] [place] [address] [phone] --  [facility]  [address] [place] [address] [phone] --  [facility]  [address] [place], [place] [address] [phone] --  [facility]  [address], [place] [address] [phone] --  [facility]  [address], [place] [address] [phone] --  [facility]  [address], [place] [address] [phone] --  [facility]  [address] [place] [address] [phone] --  [facility]  [address], [place] [address] [phone] --  [facility]  [address], [place] [address] [phone] --  [facility]  [address], [place] [address] [phone] --  [facility]  [address], [place] [address] [phone] --  [facility]  [address] [place], [place] [address] [phone] --  [facility]   [address] [place] [address] [phone] --  [facility]  [address], [place] [address] [phone] --  [facility]  [address] [place] [address] [phone] --  [facility]  [address] [place] [address] [phone] --  [facility]  [address] [place] [address] [phone] --  [facility]  [address], [place] [address] [phone] --  [facility]  [address], [place] [address] [phone] --  [facility]  [address] [place], [place] [address] [phone] --  [facility]  [address] [place] [address] [phone] --  [facility]  [address] [place] [address] [phone] --   Damita Dunnings, MSW, LCSW-A  10:03 AM 04/23/2021

## 2021-04-24 ENCOUNTER — Encounter (HOSPITAL_COMMUNITY): Payer: Self-pay

## 2021-04-24 ENCOUNTER — Other Ambulatory Visit: Payer: Self-pay

## 2021-04-24 ENCOUNTER — Encounter (HOSPITAL_COMMUNITY): Payer: Self-pay | Admitting: Family

## 2021-04-24 DIAGNOSIS — F250 Schizoaffective disorder, bipolar type: Secondary | ICD-10-CM | POA: Diagnosis not present

## 2021-04-24 LAB — CBC WITH DIFFERENTIAL/PLATELET
Abs Immature Granulocytes: 0.02 10*3/uL (ref 0.00–0.07)
Basophils Absolute: 0.1 10*3/uL (ref 0.0–0.1)
Basophils Relative: 1 %
Eosinophils Absolute: 0.2 10*3/uL (ref 0.0–0.5)
Eosinophils Relative: 3 %
HCT: 37.7 % — ABNORMAL LOW (ref 39.0–52.0)
Hemoglobin: 12.2 g/dL — ABNORMAL LOW (ref 13.0–17.0)
Immature Granulocytes: 0 %
Lymphocytes Relative: 41 %
Lymphs Abs: 2.8 10*3/uL (ref 0.7–4.0)
MCH: 30.5 pg (ref 26.0–34.0)
MCHC: 32.4 g/dL (ref 30.0–36.0)
MCV: 94.3 fL (ref 80.0–100.0)
Monocytes Absolute: 0.5 10*3/uL (ref 0.1–1.0)
Monocytes Relative: 7 %
Neutro Abs: 3.2 10*3/uL (ref 1.7–7.7)
Neutrophils Relative %: 48 %
Platelets: 192 10*3/uL (ref 150–400)
RBC: 4 MIL/uL — ABNORMAL LOW (ref 4.22–5.81)
RDW: 13.2 % (ref 11.5–15.5)
WBC: 6.8 10*3/uL (ref 4.0–10.5)
nRBC: 0 % (ref 0.0–0.2)

## 2021-04-24 LAB — COMPREHENSIVE METABOLIC PANEL
ALT: 124 U/L — ABNORMAL HIGH (ref 0–44)
AST: 135 U/L — ABNORMAL HIGH (ref 15–41)
Albumin: 4 g/dL (ref 3.5–5.0)
Alkaline Phosphatase: 132 U/L — ABNORMAL HIGH (ref 38–126)
Anion gap: 7 (ref 5–15)
BUN: 28 mg/dL — ABNORMAL HIGH (ref 6–20)
CO2: 23 mmol/L (ref 22–32)
Calcium: 8.9 mg/dL (ref 8.9–10.3)
Chloride: 98 mmol/L (ref 98–111)
Creatinine, Ser: 1.49 mg/dL — ABNORMAL HIGH (ref 0.61–1.24)
GFR, Estimated: 53 mL/min — ABNORMAL LOW (ref >60)
Glucose, Bld: 373 mg/dL — ABNORMAL HIGH (ref 70–99)
Potassium: 4.3 mmol/L (ref 3.5–5.1)
Sodium: 128 mmol/L — ABNORMAL LOW (ref 135–145)
Total Bilirubin: 0.5 mg/dL (ref 0.3–1.2)
Total Protein: 7.4 g/dL (ref 6.5–8.1)

## 2021-04-24 LAB — GLUCOSE, CAPILLARY
Glucose-Capillary: 177 mg/dL — ABNORMAL HIGH (ref 70–99)
Glucose-Capillary: 132 mg/dL — ABNORMAL HIGH (ref 70–99)
Glucose-Capillary: 267 mg/dL — ABNORMAL HIGH (ref 70–99)
Glucose-Capillary: 305 mg/dL — ABNORMAL HIGH (ref 70–99)
Glucose-Capillary: 375 mg/dL — ABNORMAL HIGH (ref 70–99)

## 2021-04-24 LAB — MAGNESIUM: Magnesium: 2.1 mg/dL (ref 1.7–2.4)

## 2021-04-24 LAB — AMMONIA: Ammonia: 35 umol/L (ref 9–35)

## 2021-04-24 LAB — LITHIUM LEVEL: Lithium Lvl: 0.89 mmol/L (ref 0.60–1.20)

## 2021-04-24 MED ORDER — INSULIN ASPART 100 UNIT/ML IJ SOLN
0.0000 [IU] | Freq: Three times a day (TID) | INTRAMUSCULAR | Status: DC
  Administered 2021-04-24: 5 [IU] via SUBCUTANEOUS
  Administered 2021-04-24: 7 [IU] via SUBCUTANEOUS
  Administered 2021-04-25: 2 [IU] via SUBCUTANEOUS
  Administered 2021-04-25: 3 [IU] via SUBCUTANEOUS

## 2021-04-24 MED ORDER — INSULIN ASPART PROT & ASPART (70-30 MIX) 100 UNIT/ML ~~LOC~~ SUSP
28.0000 [IU] | Freq: Two times a day (BID) | SUBCUTANEOUS | Status: DC
  Administered 2021-04-24 – 2021-04-25 (×2): 28 [IU] via SUBCUTANEOUS

## 2021-04-24 MED ORDER — CLONIDINE HCL 0.1 MG PO TABS
ORAL_TABLET | ORAL | Status: AC
  Filled 2021-04-24: qty 2

## 2021-04-24 MED ORDER — AMLODIPINE BESYLATE 10 MG PO TABS
10.0000 mg | ORAL_TABLET | Freq: Every day | ORAL | Status: DC
  Administered 2021-04-25 – 2021-05-26 (×31): 10 mg via ORAL
  Filled 2021-04-24 (×33): qty 1

## 2021-04-24 MED ORDER — LISINOPRIL 40 MG PO TABS
40.0000 mg | ORAL_TABLET | Freq: Every day | ORAL | Status: DC
  Administered 2021-04-25 – 2021-04-27 (×3): 40 mg via ORAL
  Filled 2021-04-24 (×5): qty 1

## 2021-04-24 MED ORDER — BENZTROPINE MESYLATE 0.5 MG PO TABS
0.5000 mg | ORAL_TABLET | Freq: Two times a day (BID) | ORAL | Status: DC
  Administered 2021-04-24 – 2021-04-28 (×9): 0.5 mg via ORAL
  Filled 2021-04-24 (×12): qty 1

## 2021-04-24 NOTE — Group Note (Signed)
Date:  04/24/2021 Time:  4:26 PM  Group Topic/Focus:  Goals Group:   The focus of this group is to help patients establish daily goals to achieve during treatment and discuss how the patient can incorporate goal setting into their daily lives to aide in recovery.   Participation Level:  Did Not Attend  Margaret Pyle 04/24/2021, 4:26 PM

## 2021-04-24 NOTE — Progress Notes (Signed)
Adult Psychoeducational Group Note  Date:  04/24/2021 Time:  8:42 PM  Group Topic/Focus:  Wrap-Up Group:   The focus of this group is to help patients review their daily goal of treatment and discuss progress on daily workbooks.  Participation Level:  Active  Participation Quality:  Appropriate  Affect:  Appropriate  Cognitive:  Appropriate  Insight: Appropriate  Engagement in Group:  Limited  Modes of Intervention:  Discussion  Additional Comments:  Pt stated his goal for today was to focus on his treatment plan. Pt stated he accomplished his goal today. Pt stated he talked with his doctor but did not get a chance to speak with his social worker about his care today. Pt rated his overall day a 10. Pt stated he made no calls today. Pt stated he felt better about himself today. Pt stated he was able to attend all meals. Pt stated he took all medications provided today. Pt stated he attend all groups held today. Pt stated his appetite was pretty good today. Pt rated sleep last night was pretty good. Pt stated the goal tonight was to get some rest. Pt stated he had no physical pain tonight. Pt deny visual hallucinations and auditory issues tonight. Pt denies thoughts of harming himself or others. Pt stated he would alert staff if anything changed  [name] 04/24/2021, 8:42 PM

## 2021-04-24 NOTE — Progress Notes (Signed)
Recreation Therapy Notes  Patient admitted to unit 1.26.23. Due to admission within last year, no new recreation therapy assessment conducted at this time. Last assessment conducted on 11.3.22.    Reason for current admission per patient, "time to come, this where I live.  Patient reports no changes in stressors from previous admission.  Patient some strengths as love and having [name] good time.  Patient reports goal of "same thing, I stay here and work so same thing"  Patient denies SI, HI, AVH at this time.    Information found below from assessment conducted 11.3.22.    Coping Skills: Sports, TV, Music, Exercise, Deep Breathing, Meditate, Substance Abuse, Art, Prayer, Hot Bath/Shower  Leisure Interests- Singing  Patient Strengths- [facility] [employer], [place], Neurosurgeon, Singing  Areas of Improvement- "Nope"    Mitchell [name] 04/24/2021 2:00 PM

## 2021-04-24 NOTE — Progress Notes (Signed)
°   04/24/21 2200  Psych Admission Type (Psych Patients Only)  Admission Status Involuntary  Psychosocial Assessment  Patient Complaints Anxiety  Eye Contact Fair  Facial Expression Sad  Affect Preoccupied  Speech Tangential  Interaction Guarded  Motor Activity Slow  Appearance/Hygiene In scrubs  Behavior Characteristics Cooperative  Mood Anxious;Preoccupied  Aggressive Behavior  Effect No apparent injury  Thought Process  Coherency Disorganized;Flight of ideas  Content Blaming others  Delusions WDL  Perception Hallucinations  Hallucination Auditory  Judgment Impaired  Confusion WDL  Danger to Self  Current suicidal ideation? Denies  Danger to Others  Danger to Others None reported or observed

## 2021-04-24 NOTE — Progress Notes (Signed)
°   04/24/21 0500  Sleep  Number of Hours 5.5

## 2021-04-24 NOTE — Hospital Course (Addendum)
TATEM MASSAD is a 61 y.o. male with PPHx of schizophrenia & multiple psych hospitalization who presented to [facility] via IVC for acute psychosis and aggression, then admitted Involuntary to [facility] for management of acute psychosis 2/2 SCZ in the setting of selective med adherence.   During the patient's hospitalization, patient had extensive initial psychiatric evaluation, and follow-up psychiatric evaluations every day.  Psychiatric diagnoses provided upon initial assessment:  Schizoaffective, bipolar type  During hospitalization, the diagnosis list was revised to reflect:  ***   Patient's psychiatric medications were adjusted on admission:  Restarted Haldol 20 mg nightly for psychosis Decreased Cogentin 1 mg to 0.5 mg twice daily for EPS prevention Restarted lithium 450 mg twice daily for mood stability Held off on restarting given patient been off his medications and unclear disposition planning  During the hospitalization, other adjustments were made to the patient's psychiatric medication regimen:  Changed Haldol 20 mg nightly to 5 mg every morning and 15 mg every night for psychosis Started and titrated Clozaril to 75 mg nightly for residual delusions. Of note, decreased from 100 mg for AM somnolence and drooling with no change in delusions. Continued lithium 450 mg twice daily for mood stability  Delete below*** -Follow-up with outpatient primary care doctor and other specialists -for management of chronic medical disease, including:  Hypertension T2DM with insulin dependence-HbA1c 9.2 (04/24/2021) Unspecified dementia  Lithium level Clozaril level  -Testing: Follow-up with outpatient provider for abnormal lab results:  CBC -  CMP -

## 2021-04-24 NOTE — Group Note (Signed)
Recreation Therapy Group Note   Group Topic:Healthy Decision Making  Group Date: 04/24/2021 Start Time: [id number] End Time: 1038 Facilitators: Victorino Sparrow, LRT,CTRS Location: [address] Dayroom   Goal Area(s) Addresses:  Patient will actively participate in stress management techniques presented during session.  Patient will successfully identify benefit of practicing stress management post d/c.   Group Description: Guided Imagery. LRT provided education, instruction, and demonstration on practice of visualization via guided imagery. Patient was asked to participate in the technique introduced during session. LRT debriefed including topics of mindfulness, stress management and specific scenarios each patient could use these techniques. Patients were given suggestions of ways to access scripts post d/c and encouraged to explore Youtube and other apps available on smartphones, tablets, and computers.   Affect/Mood: Appropriate   Participation Level: None   Participation Quality: None   Behavior: Paranoid   Speech/Thought Process: Disorganized and Delusional   Insight: Impaired   Judgement: Impaired   Modes of Intervention: Group work   Patient Response to Interventions:  Receptive   Education Outcome:  Acknowledges education and In group clarification offered    Clinical Observations/Individualized Feedback: Pt was talking to himself throughout group.  Pt was unable to focus on activity.  Pt would respond when prompted but responses were delusional and off topic.    Plan: Continue to engage patient in RT group sessions 2-3x/week.   Victorino Sparrow, LRT,CTRS 04/24/2021 1:26 PM

## 2021-04-24 NOTE — Progress Notes (Signed)
Pt Novolog 0-9 did not have scale, so per NP-Eddie I used the scale from the [facility] at the [facility], so pt received 1 unit for CBG of 132

## 2021-04-24 NOTE — Progress Notes (Signed)
Pt denies SI/HI.  Pt appears to be responding to internal stimuli.  Pt is calm throughout the day and tolerating medications without incident.  Pt has received insulin coverage before meals.  RN will continue to monitor and provide support as needed.

## 2021-04-24 NOTE — Progress Notes (Signed)
Inpatient Diabetes Program Recommendations  AACE/ADA: New Consensus Statement on Inpatient Glycemic Control (2015)  Target Ranges:  Prepandial:   less than 140 mg/dL      Peak postprandial:   less than 180 mg/dL (1-2 hours)      Critically ill patients:  140 - 180 mg/dL   Lab Results  Component Value Date   GLUCAP 132 (H) 04/24/2021   HGBA1C 11.1 (H) 01/26/2021    Review of Glycemic Control  Latest Reference Range & Units 04/23/21 20:59 04/23/21 22:55 04/24/21 05:44  Glucose-Capillary 70 - 99 mg/dL 168 (H) 177 (H) 132 (H)   Diabetes history: DM 2 Outpatient Diabetes medications: 70/30 48 units with breakfast and 70/30 42 units with supper, glipizide 20 mg with breakfast Current orders for Inpatient glycemic control: 70/30 42 units with supper, Novolog 0-9 TID with meals  Inpatient Diabetes Program Recommendations:    Please consider adding AM dose of 70/30.  Consider changing 70/30 to 28 units bid.    Thanks,  Adah Perl, RN, BC-ADM Inpatient Diabetes Coordinator Pager [phone]  (8a-5p)

## 2021-04-24 NOTE — BH IP Treatment Plan (Signed)
Interdisciplinary Treatment and Diagnostic Plan Update  04/24/2021 Mitchell [name] MRN: [id number]  Principal Diagnosis: Schizoaffective disorder [facility])  Secondary Diagnoses: Principal Problem:   Schizoaffective disorder ([place])   Current Medications:  Current Facility-Administered Medications  Medication Dose Route Frequency Provider Last Rate Last Admin   acetaminophen (TYLENOL) tablet 650 mg  650 mg Oral Q6H PRN Lucky Rathke, FNP       alum & mag hydroxide-simeth (MAALOX/MYLANTA) 200-200-20 MG/5ML suspension 30 mL  30 mL Oral Q4H PRN Lucky Rathke, FNP       [START ON 04/25/2021] amLODipine (NORVASC) tablet 10 mg  10 mg Oral QHS Merrily Brittle, DO       aspirin EC tablet 81 mg  81 mg Oral Daily Lucky Rathke, FNP   81 mg at 04/24/21 1106   atenolol (TENORMIN) tablet 12.5 mg  12.5 mg Oral Daily Lucky Rathke, FNP   12.5 mg at 04/24/21 1109   benztropine (COGENTIN) tablet 0.5 mg  0.5 mg Oral BID Maida Sale, MD       cloNIDine (CATAPRES) 0.1 MG tablet            cloNIDine (CATAPRES) tablet 0.2 mg  0.2 mg Oral BID Lucky Rathke, FNP   0.2 mg at 04/24/21 1100   cloZAPine (CLOZARIL) tablet 50 mg  50 mg Oral BID Lucky Rathke, FNP   50 mg at 04/24/21 1106   haloperidol (HALDOL) tablet 20 mg  20 mg Oral QHS Lucky Rathke, FNP       hydrOXYzine (ATARAX) tablet 25 mg  25 mg Oral TID PRN Lucky Rathke, FNP   25 mg at 04/23/21 2322   insulin aspart (novoLOG) injection 0-9 Units  0-9 Units Subcutaneous TID WC Lucky Rathke, FNP   1 Units at 04/24/21 0612   insulin aspart protamine- aspart (NOVOLOG MIX 70/30) injection 28 Units  28 Units Subcutaneous BID WC Hill, Jackie Plum, MD       [START ON 04/25/2021] lisinopril (ZESTRIL) tablet 40 mg  40 mg Oral QHS Merrily Brittle, DO       lithium carbonate (ESKALITH) CR tablet 450 mg  450 mg Oral Q12H Lucky Rathke, FNP   450 mg at 04/24/21 1100   magnesium hydroxide (MILK OF MAGNESIA) suspension 30 mL  30 mL Oral Daily PRN Lucky Rathke, FNP        traZODone (DESYREL) tablet 50 mg  50 mg Oral QHS PRN Lucky Rathke, FNP   50 mg at 04/23/21 2322   PTA Medications: Medications Prior to Admission  Medication Sig Dispense Refill Last Dose   amLODipine (NORVASC) 10 MG tablet Take 1 tablet (10 mg total) by mouth daily. (Patient not taking: Reported on 04/16/2021) 90 tablet 1    aspirin 81 MG EC tablet Take 1 tablet (81 mg total) by mouth daily. Swallow whole. (Patient not taking: Reported on 04/16/2021) 30 tablet 12    atenolol (TENORMIN) 25 MG tablet Take 0.5 tablets (12.5 mg total) by mouth daily. (Patient not taking: Reported on 04/16/2021) 15 tablet 0    benztropine (COGENTIN) 1 MG tablet Take 1 tablet (1 mg total) by mouth 2 (two) times daily. 60 tablet 2    cloNIDine (CATAPRES) 0.2 MG tablet Take 0.2 mg by mouth 2 (two) times daily. (Patient not taking: Reported on 01/26/2021)      cloZAPine (CLOZARIL) 50 MG tablet Take 1 tablet (50 mg total) by mouth 2 (two) times daily. 60 tablet 0  glipiZIDE (GLUCOTROL XL) 10 MG 24 hr tablet Take 2 tablets (20 mg total) by mouth daily with breakfast. 60 tablet 0    haloperidol (HALDOL) 20 MG tablet Take 1 tablet (20 mg total) by mouth at bedtime. (Patient not taking: Reported on 01/26/2021) 90 tablet 1    insulin aspart (NOVOLOG) 100 UNIT/ML injection Inject 0-9 Units into the skin 3 (three) times daily with meals. 10 mL 11    insulin aspart protamine - aspart (NOVOLOG MIX 70/30 FLEXPEN) (70-30) 100 UNIT/ML FlexPen Inject 48 Units into the skin daily with breakfast. 15 mL 0    insulin aspart protamine- aspart (NOVOLOG MIX 70/30) (70-30) 100 UNIT/ML injection Inject 0.42 mLs (42 Units total) into the skin daily with supper. 10 mL 11    lisinopril (ZESTRIL) 40 MG tablet Take 1 tablet (40 mg total) by mouth daily. (Patient not taking: Reported on 01/11/2020) 90 tablet 1    lithium carbonate (ESKALITH) 450 MG CR tablet Take 1 tablet (450 mg total) by mouth every 12 (twelve) hours. (Patient not taking: Reported on  01/26/2021) 60 tablet 2    traZODone (DESYREL) 50 MG tablet Take 1 tablet (50 mg total) by mouth at bedtime as needed for sleep.       Patient Stressors: Marital or family conflict   Medication change or noncompliance    Patient Strengths: Psychologist, clinical for treatment/growth   Treatment Modalities: Medication Management, Group therapy, Case management,  1 to 1 session with clinician, Psychoeducation, Recreational therapy.   Physician Treatment Plan for Primary Diagnosis: Schizoaffective disorder ([place]) Long Term Goal(s):     Short Term Goals:    Medication Management: Evaluate patient's response, side effects, and tolerance of medication regimen.  Therapeutic Interventions: 1 to 1 sessions, Unit Group sessions and Medication administration.  Evaluation of Outcomes: Not Met  Physician Treatment Plan for Secondary Diagnosis: Principal Problem:   Schizoaffective disorder ([place])  Long Term Goal(s):     Short Term Goals:       Medication Management: Evaluate patient's response, side effects, and tolerance of medication regimen.  Therapeutic Interventions: 1 to 1 sessions, Unit Group sessions and Medication administration.  Evaluation of Outcomes: Not Met   RN Treatment Plan for Primary Diagnosis: Schizoaffective disorder ([place]) Long Term Goal(s): Knowledge of disease and therapeutic regimen to maintain health will improve  Short Term Goals: Ability to demonstrate self-control, Ability to verbalize feelings will improve, and Ability to identify and develop effective coping behaviors will improve  Medication Management: RN will administer medications as ordered by provider, will assess and evaluate patient's response and provide education to patient for prescribed medication. RN will report any adverse and/or side effects to prescribing provider.  Therapeutic Interventions: 1 on 1 counseling sessions, Psychoeducation, Medication administration, Evaluate  responses to treatment, Monitor vital signs and CBGs as ordered, Perform/monitor CIWA, COWS, AIMS and Fall Risk screenings as ordered, Perform wound care treatments as ordered.  Evaluation of Outcomes: Not Met   LCSW Treatment Plan for Primary Diagnosis: Schizoaffective disorder ([place]) Long Term Goal(s): Safe transition to appropriate next level of care at discharge, Engage patient in therapeutic group addressing interpersonal concerns.  Short Term Goals: Engage patient in aftercare planning with referrals and resources, Increase social support, and Increase ability to appropriately verbalize feelings  Therapeutic Interventions: Assess for all discharge needs, 1 to 1 time with Social worker, Explore available resources and support systems, Assess for adequacy in community support network, Educate family and significant other(s) on suicide prevention,  Complete Psychosocial Assessment, Interpersonal group therapy.  Evaluation of Outcomes: Not Met   Progress in Treatment: Attending groups: Yes. Participating in groups: Yes. Taking medication as prescribed: Yes. Toleration medication: Yes. Family/Significant other contact made: No, will contact:  CSW will obtain consent Patient understands diagnosis: No. Discussing patient identified problems/goals with staff: Yes. Medical problems stabilized or resolved: Yes. Denies suicidal/homicidal ideation: Yes. Issues/concerns per patient self-inventory: No. Other: None  New problem(s) identified: No, Describe:  None  New Short Term/Long Term Goal(s):medication stabilization, elimination of SI thoughts, development of comprehensive mental wellness plan.   Patient Goals:  "to sit down and mind by business and think about my triggers and songs."  Discharge Plan or Barriers: Patient recently admitted. CSW will continue to follow and assess for appropriate referrals and possible discharge planning.   Reason for Continuation of Hospitalization:  Delusions  Hallucinations Medication stabilization  Estimated Length of Stay: 3-5 days   Scribe for Treatment Team: Eliott Nine 04/24/2021 11:51 AM
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
## 2021-04-24 NOTE — Group Note (Signed)
LCSW Group Therapy Note ° ° °Group Date: 04/24/2021 °Start Time: 1100 °End Time: 1200 ° ° °Due to activities disrupting the unit group was not held.  ° ° °Arelyn Gauer A Grainger Mccarley, LCSW °04/24/2021  11:59 AM   ° °

## 2021-04-25 DIAGNOSIS — F250 Schizoaffective disorder, bipolar type: Secondary | ICD-10-CM | POA: Diagnosis not present

## 2021-04-25 LAB — HEMOGLOBIN A1C
Hgb A1c MFr Bld: 9.2 % — ABNORMAL HIGH (ref 4.8–5.6)
Mean Plasma Glucose: 217.34 mg/dL

## 2021-04-25 LAB — GLUCOSE, CAPILLARY
Glucose-Capillary: 184 mg/dL — ABNORMAL HIGH (ref 70–99)
Glucose-Capillary: 220 mg/dL — ABNORMAL HIGH (ref 70–99)
Glucose-Capillary: 215 mg/dL — ABNORMAL HIGH (ref 70–99)
Glucose-Capillary: 438 mg/dL — ABNORMAL HIGH (ref 70–99)
Glucose-Capillary: 390 mg/dL — ABNORMAL HIGH (ref 70–99)
Glucose-Capillary: 376 mg/dL — ABNORMAL HIGH (ref 70–99)
Glucose-Capillary: 448 mg/dL — ABNORMAL HIGH (ref 70–99)
Glucose-Capillary: 389 mg/dL — ABNORMAL HIGH (ref 70–99)

## 2021-04-25 MED ORDER — INSULIN ASPART 100 UNIT/ML IJ SOLN
0.0000 [IU] | Freq: Three times a day (TID) | INTRAMUSCULAR | Status: DC
  Administered 2021-04-25: 5 [IU] via SUBCUTANEOUS
  Administered 2021-04-26: 3 [IU] via SUBCUTANEOUS
  Administered 2021-04-26: 8 [IU] via SUBCUTANEOUS
  Administered 2021-04-26: 5 [IU] via SUBCUTANEOUS

## 2021-04-25 MED ORDER — OLANZAPINE 10 MG PO TBDP: 10.0000 mg | ORAL_TABLET | Freq: Three times a day (TID) | ORAL | Status: DC | PRN

## 2021-04-25 MED ORDER — ZIPRASIDONE MESYLATE 20 MG IM SOLR: 20.0000 mg | INTRAMUSCULAR | Status: DC | PRN

## 2021-04-25 MED ORDER — INSULIN ASPART 100 UNIT/ML IJ SOLN
5.0000 [IU] | Freq: Once | INTRAMUSCULAR | Status: AC
  Administered 2021-04-25: 5 [IU] via SUBCUTANEOUS

## 2021-04-25 MED ORDER — LORAZEPAM 1 MG PO TABS: 1.0000 mg | ORAL_TABLET | ORAL | Status: DC | PRN

## 2021-04-25 MED ORDER — INSULIN ASPART PROT & ASPART (70-30 MIX) 100 UNIT/ML ~~LOC~~ SUSP
32.0000 [IU] | Freq: Two times a day (BID) | SUBCUTANEOUS | Status: DC
  Administered 2021-04-25 – 2021-04-29 (×8): 32 [IU] via SUBCUTANEOUS

## 2021-04-25 NOTE — [facility] Group Notes (Signed)
Adult Psychoeducational Group Note  Date:  04/25/2021 Time:  10:50 PM  Group Topic/Focus:  Goals Group:   The focus of this group is to help patients establish daily goals to achieve during treatment and discuss how the patient can incorporate goal setting into their daily lives to aide in recovery.  Participation Level:  Active  Participation Quality:  Attentive  Affect:  Appropriate  Cognitive:  Appropriate  Insight: Appropriate  Engagement in Group:  Limited  Modes of Intervention:  Discussion  Additional Comments  Dalene Carrow 04/25/2021, 10:50 PM

## 2021-04-25 NOTE — Group Note (Signed)
°  [facility] LCSW Group Therapy Note  Date/Time:  04/25/2021 11:30am-12:00pm  Type of Therapy and Topic:  Group Therapy:  Self-Soothing  Participation Level:  Did Not Attend   Description of Group This process group involved a discussion with and between patients about self-soothing.   The difference between healthy and unhealthy coping skills was described, then examples were elicited from group members for healthy and unhealthy self-soothing techniques.  This then was followed by a discussion about self-soothing, what it is, how important it is, and why we choose the coping techniques we choose.  Participants were encouraged to think about self-soothing as necessary and positive.  Therapeutic Goals Patient will identify and describe one healthy and one unhealthy self-soothing technique they often use Patient will participate in generating ideas about healthy self-soothing options for both during this hospital stay and when they return to the community Patients will be supportive of one another and receive support from others Patients will understand the need all humans have to self-soothe  Summary of Patient Progress:  The patient did not attend group.   Therapeutic Modalities Brief Solution-Focused Therapy Psychoeducation   Selmer Dominion, LCSW 04/25/2021

## 2021-04-25 NOTE — [facility] Group Notes (Signed)
Goals Group 04/25/21    Group Focus: affirmation, clarity of thought, and goals/reality orientation Treatment Modality:  Psychoeducation Interventions utilized were assignment, group exercise, and support Purpose: To be able to understand and verbalize the reason for their admission to the hospital. To understand that the medication helps with their chemical imbalance but they also need to work on their choices in life. To be challenged to develop a list of 30 positives about themselves. Also introduce the concept that "feelings" are not reality.  Participation Level:  inactive  Participation Quality:  inappropriate  Affect:  Appropriate  Cognitive:  unable to assess  Insight:  lacking  Engagement in Group:  not engaged  Additional Comments:  Pt states he is a 10/10. Insisted his name is Mitchell [name]. Sat in the group and talked the whole time as if someone was to his right, in the empty chair.  Dione Housekeeper

## 2021-04-25 NOTE — Progress Notes (Signed)
Pt given 5 units of Novolog.  Will continue to monitor.

## 2021-04-25 NOTE — Plan of Care (Addendum)
Pt has been ambulatory in the hall on the unit and observed responding to internal stimuli. Pt attended goals group this morning but did not participate. Pt has been calm and cooperative and compliant with his medication regimen. Support and encouragement provided, q15 minute safety checks remain in effect. Medications administered per MD orders. No reactions/side effects to medicine noted. Pt remains safe on and off the unit.    Problem: Activity: Goal: Interest or engagement in activities will improve Outcome: Progressing   Problem: Activity: Goal: Sleeping patterns will improve Outcome: Progressing   Problem: Coping: Goal: Ability to demonstrate self-control will improve Outcome: Progressing   Problem: Safety: Goal: Periods of time without injury will increase Outcome: Progressing   Problem: Health Behavior/Discharge Planning: Goal: Compliance with prescribed medication regimen will improve Outcome: Progressing

## 2021-04-25 NOTE — Progress Notes (Signed)
HS blood sugar was 438.  Notified Oneida Alar NP.  Encourage plenty of water and pt was provided with two bottles of water.  Pt still has active insulin up until 3 hours (last insulin at 1726-after supper).  Recheck in 30 minutes.   2009 CBG 390-recheck 30 minutes  2046 CBG 376-recheck in 60 minutes and report back  2147 CBG 448.  Notified Leandro Reasoner NP.  Awaiting instructions at this time.

## 2021-04-25 NOTE — Progress Notes (Signed)
°   04/25/21 0645  °Sleep  °Number of Hours 7  ° ° °

## 2021-04-25 NOTE — Progress Notes (Signed)
Note: [name] attended Mitchell [name] but did not participated.

## 2021-04-25 NOTE — Progress Notes (Signed)
[facility] MD Progress Note  04/25/2021 2:05 PM Mitchell [name]  MRN:  [id number] Subjective:   Mitchell [name] is a 62 y.o. male with PPHx of Schizophrenia and multiple hospitalizations, who presented to [facility] via IVC for delusion that he is Mitchell [name] and Mitchell [name] and threatening to jump into a trash compactor, then admitted Involuntary to [facility] for treatment of schizophrenia exacerbated by medication selective adherence.  The patient's chart was reviewed and nursing notes were reviewed. Over the past 24 hrs, there were no documented behavioral issues, no PRN medications given for agitation, and the patient was compliant with scheduled medications. The patient's case was discussed in multidisciplinary team meeting.   On interview and assessment this morning, the patient appears delusional.  He frequently mumbles and is dysarthric.  He reports going to a group, where they discussed goals.  When asked what his goals were, he reports "mind my business, pray".  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 The patient denies auditory/visual hallucinations and first rank symptoms.  He reports good mood, appetite, and sleep. He denies suicidal and homicidal thoughts. He denies side effects from their medications.  Review of systems as below.  Principal Problem: Schizoaffective disorder, bipolar type ([place]) Diagnosis: Principal Problem:   Schizoaffective disorder, bipolar type ([place]) Active Problems:   Hypertension   Type 2 diabetes mellitus ([place])  Total Time Spent in Direct Patient Care: I personally spent 30 minutes on the unit in direct patient care. The direct patient care time included face-to-face time with the patient, reviewing the patient's chart, communicating with other professionals, and coordinating care. Greater than 50% of this time was spent in counseling or coordinating care with the patient regarding goals of hospitalization,  psycho-education, and discharge planning needs.   Past Psychiatric History: Past psych diagnoses: Schizophrenia Prior inpatient treatment: [facility] ~14x (latest 01/28/2021), [facility] ~2020 Suicide attempts:  Psychiatric med trials: Zyprexa, loxapine, paliperidone injection/156 mg, Geodon Neuromodulation history: N/A Current outpatient psychiatrist:  Current outpatient therapist:  History of selective adherence: Yes.    Past Medical History:  Past Medical History:  Diagnosis Date   Bipolar affective disorder ([name])    takes Zyprexa daily   Coarse tremors 10/02/2014   Diabetes mellitus    takes Victoza,Metformin,and Glipizide daily   Hypertension    takes Amlodipine,Lisinopril and Clonidine daily   Hyponatremia    history of   Lithium toxicity 10/02/2014   Mental disorder    takes Lithium daily   Schizoaffective disorder    takes Trazodone nightly   Schizoaffective disorder ([place]) 01/04/2019   Seasonal allergies    takes Claritin daily   Sleep apnea    sleep study >98yrs ago   Stroke [facility])    left arm weakness    Past Surgical History:  Procedure Laterality Date   CATARACT EXTRACTION W/PHACO Right 02/14/2013   Procedure: CATARACT EXTRACTION PHACO AND INTRAOCULAR LENS PLACEMENT ([place]);  Surgeon: Adonis Brook, MD;  Location: [place];  Service: Ophthalmology;  Laterality: Right;   CATARACT EXTRACTION W/PHACO Left 06/13/2013   Procedure: CATARACT EXTRACTION PHACO AND INTRAOCULAR LENS PLACEMENT ([facility]);  Surgeon: Adonis Brook, MD;  Location: [place];  Service: Ophthalmology;  Laterality: Left;   CIRCUMCISION  20 yrs. ago   EYE SURGERY     Family History: History reviewed. No pertinent family history. Family Psychiatric  History: unable to obtain Social History:  Social History   Substance and Sexual Activity  Alcohol Use Not Currently     Social History   Substance and Sexual Activity  Drug Use No    Social History   Socioeconomic History   Marital status: Divorced    Spouse  name: Not on file   Number of children: Not on file   Years of education: Not on file   Highest education level: Not on file  Occupational History   Not on file  Tobacco Use   Smoking status: Never   Smokeless tobacco: Never  Vaping Use   Vaping Use: Never used  Substance and Sexual Activity   Alcohol use: Not Currently   Drug use: No   Sexual activity: Yes    Birth control/protection: None  Other Topics Concern   Not on file  Social History Narrative   Not on file   Social Determinants of Health   Financial Resource Strain: Not on file  Food Insecurity: Not on file  Transportation Needs: Not on file  Physical Activity: Not on file  Stress: Not on file  Social Connections: Not on file   Additional Social History:  NA    Sleep: Fair  Appetite:  Fair  Current Medications: Current Facility-Administered Medications  Medication Dose Route Frequency Provider Last Rate Last Admin   acetaminophen (TYLENOL) tablet 650 mg  650 mg Oral Q6H PRN Lucky Rathke, FNP       alum & mag hydroxide-simeth (MAALOX/MYLANTA) 200-200-20 MG/5ML suspension 30 mL  30 mL Oral Q4H PRN Lucky Rathke, FNP       amLODipine (NORVASC) tablet 10 mg  10 mg Oral QHS Merrily Brittle, DO       aspirin EC tablet 81 mg  81 mg Oral Daily Lucky Rathke, FNP   81 mg at 04/25/21 0735   atenolol (TENORMIN) tablet 12.5 mg  12.5 mg Oral Daily Lucky Rathke, FNP   12.5 mg at 04/25/21 0735   benztropine (COGENTIN) tablet 0.5 mg  0.5 mg Oral BID Maida Sale, MD   0.5 mg at 04/25/21 0735   cloNIDine (CATAPRES) tablet 0.2 mg  0.2 mg Oral BID Lucky Rathke, FNP   0.2 mg at 04/25/21 0734   cloZAPine (CLOZARIL) tablet 50 mg  50 mg Oral BID Lucky Rathke, FNP   50 mg at 04/25/21 0734   haloperidol (HALDOL) tablet 20 mg  20 mg Oral QHS Lucky Rathke, FNP   20 mg at 04/24/21 2049   hydrOXYzine (ATARAX) tablet 25 mg  25 mg Oral TID PRN Lucky Rathke, FNP   25 mg at 04/24/21 2049   insulin aspart (novoLOG) injection 0-9  Units  0-9 Units Subcutaneous TID WC Green, Terri L, RPH   3 Units at 04/25/21 1214   insulin aspart protamine- aspart (NOVOLOG MIX 70/30) injection 28 Units  28 Units Subcutaneous BID WC Maida Sale, MD   28 Units at 04/25/21 0738   lisinopril (ZESTRIL) tablet 40 mg  40 mg Oral QHS Merrily Brittle, DO       lithium carbonate (ESKALITH) CR tablet 450 mg  450 mg Oral Q12H Lucky Rathke, FNP   450 mg at 04/25/21 0734   OLANZapine zydis (ZYPREXA) disintegrating tablet 10 mg  10 mg Oral Q8H PRN Corky Sox, MD       And   LORazepam (ATIVAN) tablet 1 mg  1 mg Oral PRN Corky Sox, MD       And   ziprasidone (GEODON) injection 20 mg  20  mg Intramuscular PRN Corky Sox, MD       magnesium hydroxide (MILK OF MAGNESIA) suspension 30 mL  30 mL Oral Daily PRN Lucky Rathke, FNP       traZODone (DESYREL) tablet 50 mg  50 mg Oral QHS PRN Lucky Rathke, FNP   50 mg at 04/24/21 2049    Lab Results:  Results for orders placed or performed during the hospital encounter of 04/23/21 (from the past 48 hour(s))  Glucose, capillary     Status: Abnormal   Collection Time: 04/23/21 10:55 PM  Result Value Ref Range   Glucose-Capillary 177 (H) 70 - 99 mg/dL    Comment: Glucose reference range applies only to samples taken after fasting for at least 8 hours.  Glucose, capillary     Status: Abnormal   Collection Time: 04/24/21  5:44 AM  Result Value Ref Range   Glucose-Capillary 132 (H) 70 - 99 mg/dL    Comment: Glucose reference range applies only to samples taken after fasting for at least 8 hours.  Lithium level     Status: None   Collection Time: 04/24/21  6:17 AM  Result Value Ref Range   Lithium Lvl 0.89 0.60 - 1.20 mmol/L    Comment: Performed at [facility], [place] [address] [place], [place] [address]  Glucose, capillary     Status: Abnormal   Collection Time: 04/24/21 12:09 PM  Result Value Ref Range   Glucose-Capillary 267 (H) 70 - 99 mg/dL    Comment: Glucose  reference range applies only to samples taken after fasting for at least 8 hours.  Glucose, capillary     Status: Abnormal   Collection Time: 04/24/21  5:13 PM  Result Value Ref Range   Glucose-Capillary 305 (H) 70 - 99 mg/dL    Comment: Glucose reference range applies only to samples taken after fasting for at least 8 hours.  Comprehensive metabolic panel     Status: Abnormal   Collection Time: 04/24/21  6:32 PM  Result Value Ref Range   Sodium 128 (L) 135 - 145 mmol/L   Potassium 4.3 3.5 - 5.1 mmol/L   Chloride 98 98 - 111 mmol/L   CO2 23 22 - 32 mmol/L   Glucose, Bld 373 (H) 70 - 99 mg/dL    Comment: Glucose reference range applies only to samples taken after fasting for at least 8 hours.   BUN 28 (H) 6 - 20 mg/dL   Creatinine, Ser 1.49 (H) 0.61 - 1.24 mg/dL   Calcium 8.9 8.9 - 10.3 mg/dL   Total Protein 7.4 6.5 - 8.1 g/dL   Albumin 4.0 3.5 - 5.0 g/dL   AST 135 (H) 15 - 41 U/L   ALT 124 (H) 0 - 44 U/L   Alkaline Phosphatase 132 (H) 38 - 126 U/L   Total Bilirubin 0.5 0.3 - 1.2 mg/dL   GFR, Estimated 53 (L) >60 mL/min    Comment: (NOTE) Calculated using the CKD-EPI Creatinine Equation (2021)    Anion gap 7 5 - 15    Comment: Performed at [facility], [place] [address], [place], [place] [address]  Ammonia     Status: None   Collection Time: 04/24/21  6:32 PM  Result Value Ref Range   Ammonia 35 9 - 35 umol/L    Comment: Performed at [facility], [place] [address] [place], [place] [address]  Magnesium     Status: None   Collection Time: 04/24/21  6:32 PM  Result Value Ref Range   Magnesium 2.1 1.7 - 2.4 mg/dL    Comment: Performed at [facility], [place] [address] [place], [place] [address]  CBC with Differential/Platelet     Status: Abnormal   Collection Time: 04/24/21  6:32 PM  Result Value Ref Range   WBC 6.8 4.0 - 10.5 K/uL   RBC 4.00 (L) 4.22 - 5.81 MIL/uL   Hemoglobin 12.2 (L) 13.0 - 17.0 g/dL   HCT 37.7 (L) 39.0  - 52.0 %   MCV 94.3 80.0 - 100.0 fL   MCH 30.5 26.0 - 34.0 pg   MCHC 32.4 30.0 - 36.0 g/dL   RDW 13.2 11.5 - 15.5 %   Platelets 192 150 - 400 K/uL    Comment: SPECIMEN CHECKED FOR CLOTS REPEATED TO VERIFY PLATELET COUNT CONFIRMED BY SMEAR    nRBC 0.0 0.0 - 0.2 %   Neutrophils Relative % 48 %   Neutro Abs 3.2 1.7 - 7.7 K/uL   Lymphocytes Relative 41 %   Lymphs Abs 2.8 0.7 - 4.0 K/uL   Monocytes Relative 7 %   Monocytes Absolute 0.5 0.1 - 1.0 K/uL   Eosinophils Relative 3 %   Eosinophils Absolute 0.2 0.0 - 0.5 K/uL   Basophils Relative 1 %   Basophils Absolute 0.1 0.0 - 0.1 K/uL   Immature Granulocytes 0 %   Abs Immature Granulocytes 0.02 0.00 - 0.07 K/uL    Comment: Performed at [facility], [place] [address], [place], [place] [address]  Hemoglobin A1c     Status: Abnormal   Collection Time: 04/24/21  6:32 PM  Result Value Ref Range   Hgb A1c MFr Bld 9.2 (H) 4.8 - 5.6 %    Comment: (NOTE) Pre diabetes:          5.7%-6.4%  Diabetes:              >6.4%  Glycemic control for   <7.0% adults with diabetes    Mean Plasma Glucose 217.34 mg/dL    Comment: Performed at [place] [address] [place], [place] [address]  Glucose, capillary     Status: Abnormal   Collection Time: 04/24/21  7:34 PM  Result Value Ref Range   Glucose-Capillary 375 (H) 70 - 99 mg/dL    Comment: Glucose reference range applies only to samples taken after fasting for at least 8 hours.  Glucose, capillary     Status: Abnormal   Collection Time: 04/25/21  5:24 AM  Result Value Ref Range   Glucose-Capillary 184 (H) 70 - 99 mg/dL    Comment: Glucose reference range applies only to samples taken after fasting for at least 8 hours.  Glucose, capillary     Status: Abnormal   Collection Time: 04/25/21 12:11 PM  Result Value Ref Range   Glucose-Capillary 220 (H) 70 - 99 mg/dL    Comment: Glucose reference range applies only to samples taken after fasting for at least 8 hours.     Blood Alcohol level:  Lab Results  Component Value Date   ETH <10 04/16/2021   ETH <10 [id number]    Metabolic Disorder Labs: Lab Results  Component Value Date   HGBA1C 9.2 (H) 04/24/2021   MPG 217.34 04/24/2021   MPG 271.87 01/26/2021   No results found for: PROLACTIN Lab Results  Component Value Date   CHOL 140 01/30/2021   TRIG 95 01/30/2021   HDL 49 01/30/2021   CHOLHDL 2.9  01/30/2021   VLDL 19 01/30/2021   LDLCALC 72 01/30/2021   LDLCALC 67 05/20/2020   Psychiatric Specialty Exam: Physical Exam  Review of Systems  Blood pressure 134/83, pulse 85, temperature 98.3 F (36.8 C), temperature source Oral, resp. rate 16, height 5\' 4"  (1.626 m), weight 98.9 kg, SpO2 100 %.Body mass index is 37.42 kg/m.  General Appearance: elderly male in blue scrubs, lying in bed snoring  Eye Contact:  poor  Speech:  Garbled  Volume:  Normal  Mood:  "good"  Affect:  constricted  Thought Process:  Disorganized  Orientation:  partial  Thought Content:  Patient denied SI/HI/AVH, delusions, paranoia, first rank symptoms. Patient is not grossly responding to internal/external stimuli on exam and did not make delusional statements.  Suicidal Thoughts:  No  Homicidal Thoughts:  No  Memory:  impaired  Judgement:  poor  Insight:  poor  Psychomotor Activity:  normal  Concentration:  fair  Recall:  poor  Fund of Knowledge:  poor  Language:  fair  Akathisia:  no    AIMS (if indicated):   not yet tested  Assets:  Social Support  ADL's:  Intact    Sleep:  Number of Hours: 7   Treatment Plan Summary: Daily contact with patient to assess and evaluate symptoms and progress in treatment and Medication management  Safety and Monitoring -- INVOLUNTARY admission to inpatient psychiatric unit for safety, stabilization and treatment -- Daily contact with patient to assess and evaluate symptoms and progress in treatment -- Patient's case to be discussed in multi-disciplinary team  meeting -- Observation Level : q15 minute checks -- Vital signs:  q12 hours -- Precautions: suicide  Treatment Assessment & Plan Summary: Principal Problem:   Schizoaffective disorder, bipolar type ([place]) Active Problems:   Hypertension   Type 2 diabetes mellitus ([place])    Schizophrenia, acute psychosis Li level 0.89. Patient is still delusional and disorganized. -Continued Haldol 20 mg QHS for psychosis -Continue home clozapine 50 mg BID for psychosis -Decreased Cogentin 1 mg to 0.5 mg  BID for EPS prevention -Consider LAI given lack of compliance and recurrent hospitalizations -Continued Lithium 450 mg BID for mood stability, consider consolidating    T2DM with insulin dependence A1C of 9.2. over the past 24 hrs: fasting CBG of 184, daytime CBGs up to 375. Home regimen of 70/30 48U with breakfast and 42U with supper, prandial aspart 0-9U. -Increase 70/30 from 28U BID to 32U BID -Increase SSI from sensitive to moderate   HTN Continue home regimen -Lisinopril 40 mg qHS -Amlodipine 10 mg daily -Clonidine 0.2 mg BID   Elevated Cr Cr 1.4 on a baseline of  1-1.2, likely pre-renal in the context of decreased PO. -CMP tomorrow AM   Dispo: Pcp, No    Corky Sox, MD PGY-1

## 2021-04-26 LAB — GLUCOSE, CAPILLARY
Glucose-Capillary: 191 mg/dL — ABNORMAL HIGH (ref 70–99)
Glucose-Capillary: 153 mg/dL — ABNORMAL HIGH (ref 70–99)
Glucose-Capillary: 203 mg/dL — ABNORMAL HIGH (ref 70–99)
Glucose-Capillary: 274 mg/dL — ABNORMAL HIGH (ref 70–99)
Glucose-Capillary: 296 mg/dL — ABNORMAL HIGH (ref 70–99)

## 2021-04-26 MED ORDER — INSULIN ASPART 100 UNIT/ML IJ SOLN
0.0000 [IU] | Freq: Every day | INTRAMUSCULAR | Status: DC
  Administered 2021-04-26: 3 [IU] via SUBCUTANEOUS
  Administered 2021-04-27 – 2021-04-28 (×2): 4 [IU] via SUBCUTANEOUS
  Administered 2021-04-29: 3 [IU] via SUBCUTANEOUS
  Administered 2021-04-30 – 2021-05-01 (×2): 4 [IU] via SUBCUTANEOUS
  Administered 2021-05-02: 5 [IU] via SUBCUTANEOUS
  Administered 2021-05-04: 3 [IU] via SUBCUTANEOUS
  Administered 2021-05-05: 5 [IU] via SUBCUTANEOUS
  Administered 2021-05-06 – 2021-05-07 (×2): 2 [IU] via SUBCUTANEOUS
  Administered 2021-05-08: 3 [IU] via SUBCUTANEOUS

## 2021-04-26 MED ORDER — INSULIN ASPART 100 UNIT/ML IJ SOLN
0.0000 [IU] | Freq: Three times a day (TID) | INTRAMUSCULAR | Status: DC
  Administered 2021-04-27: 5 [IU] via SUBCUTANEOUS
  Administered 2021-04-27: 2 [IU] via SUBCUTANEOUS
  Administered 2021-04-27: 8 [IU] via SUBCUTANEOUS
  Administered 2021-04-28: 11 [IU] via SUBCUTANEOUS
  Administered 2021-04-28: 3 [IU] via SUBCUTANEOUS
  Administered 2021-04-28: 8 [IU] via SUBCUTANEOUS
  Administered 2021-04-29: 3 [IU] via SUBCUTANEOUS
  Administered 2021-04-29: 11 [IU] via SUBCUTANEOUS
  Administered 2021-04-29: 8 [IU] via SUBCUTANEOUS
  Administered 2021-04-30: 11 [IU] via SUBCUTANEOUS
  Administered 2021-04-30: 3 [IU] via SUBCUTANEOUS
  Administered 2021-04-30: 15 [IU] via SUBCUTANEOUS
  Administered 2021-05-01: 11 [IU] via SUBCUTANEOUS
  Administered 2021-05-01: 15 [IU] via SUBCUTANEOUS
  Administered 2021-05-01: 3 [IU] via SUBCUTANEOUS
  Administered 2021-05-02: 11 [IU] via SUBCUTANEOUS
  Administered 2021-05-02: 3 [IU] via SUBCUTANEOUS
  Administered 2021-05-02: 8 [IU] via SUBCUTANEOUS
  Administered 2021-05-03: 3 [IU] via SUBCUTANEOUS
  Administered 2021-05-03: 2 [IU] via SUBCUTANEOUS
  Administered 2021-05-03: 11 [IU] via SUBCUTANEOUS
  Administered 2021-05-04: 5 [IU] via SUBCUTANEOUS
  Administered 2021-05-04 (×2): 3 [IU] via SUBCUTANEOUS
  Administered 2021-05-05: 5 [IU] via SUBCUTANEOUS
  Administered 2021-05-05: 3 [IU] via SUBCUTANEOUS
  Administered 2021-05-05: 5 [IU] via SUBCUTANEOUS
  Administered 2021-05-06 (×2): 3 [IU] via SUBCUTANEOUS
  Administered 2021-05-06 – 2021-05-07 (×2): 5 [IU] via SUBCUTANEOUS
  Administered 2021-05-07 (×2): 3 [IU] via SUBCUTANEOUS
  Administered 2021-05-08: 5 [IU] via SUBCUTANEOUS
  Administered 2021-05-08 – 2021-05-09 (×4): 3 [IU] via SUBCUTANEOUS
  Administered 2021-05-10 – 2021-05-11 (×2): 11 [IU] via SUBCUTANEOUS
  Administered 2021-05-11: 3 [IU] via SUBCUTANEOUS
  Administered 2021-05-12: 2 [IU] via SUBCUTANEOUS
  Administered 2021-05-12 – 2021-05-13 (×2): 3 [IU] via SUBCUTANEOUS

## 2021-04-26 NOTE — Progress Notes (Signed)
D:  Mitchell [name] was up and visible on the unit.  He was noted talking and singing loudly in his room.  He was pleasant but minimal.  He denied visual hallucinations or SI/HI.  His blood sugar finally came down to 153.  Discussed with him about not drinking fruit juices and more water.  Encouraged him to use artificial sweeteners instead of sugar.  He took his hs medications without difficulty.   A:  1:1 with RN for support and encouragement.  Medications given as ordered.  Q 15 minute checks maintained for safety.  Encouraged participation in group and unit activities.   R:  He is currently resting with his eyes closed and appears to be asleep.  He remains safe on the unit.  We will continue to monitor the progress towards his goals.

## 2021-04-26 NOTE — Group Note (Signed)
Date:  04/26/2021 °Time:  9:26 AM ° °Group Topic/Focus:  °Goals Group:   The focus of this group is to help patients establish daily goals to achieve during treatment and discuss how the patient can incorporate goal setting into their daily lives to aide in recovery. ° ° ° °Participation Level:  Did Not Attend ° °Participation Quality:   ° °Affect:   ° °Cognitive:   ° °Insight:  ° °Engagement in Group:   ° °Modes of Intervention:   ° °Additional Comments:  Pt did not attend group. ° °Thao Vanover Lashawn Khady Vandenberg °04/26/2021, 9:26 AM ° °

## 2021-04-26 NOTE — Progress Notes (Signed)
[place]'s CBG is now down to 191 and provider notified.  We will continue to monitor.

## 2021-04-26 NOTE — [facility] Group Notes (Signed)
[facility] Group Notes:  (Nursing/MHT/Case Management/Adjunct)  Date:  04/26/2021  Time:  4:48 PM  Type of Therapy:   Therapeutic Relaxation group  Participation Level:  Active  Participation Quality:  Appropriate  Affect:  Appropriate  Cognitive:  Disorganized  Insight:  Lacking  Engagement in Group:  Improving and Lacking  Modes of Intervention:  Activity, Discussion, and Education  Summary of Progress/Problems: Group consisted of listening to meditative music and practicing stress-relieving, deep breathing exercises that results in calming the mind and body.   Kalina Morabito J Jigar Zielke 04/26/2021, 4:48 PM

## 2021-04-26 NOTE — Progress Notes (Addendum)
[facility] MD Progress Note  04/26/2021 6:33 PM Mitchell [name]  MRN:  [id number] Subjective:   Mitchell [name] is a 61 y.o. male with PPHx of Schizophrenia and multiple hospitalizations, who presented to [facility] via IVC for delusion that he is Mitchell [name] and [name] and threatening to jump into a trash compactor, then admitted Involuntary to [facility] for treatment of schizophrenia exacerbated by medication selective adherence.  The patient's chart was reviewed and nursing notes were reviewed. Over the past 24 hrs, there were no documented behavioral issues, no PRN medications given for agitation, and the patient was compliant with scheduled medications. The patient's case was discussed in multidisciplinary team meeting.  Patient was noted to be talking to himself in his room.  On interview and assessment this morning, the patient continued to exhibit delusional thought content, with irritable attitude towards this provider.  Patient stated that his name was Mitchell [name], and that he is in a "general hospital", that this is his mentioned that he is setting up for his children.  That he was admitted for taking care of his children at home.  Patient was irritable however stated that his sleep, appetite, mood were "GREAT".  Patient then mumbled about "all them are after me out there", and declined to further explain. Patient denied SI/HI/AVH, first rank symptoms, and contracted to safety on the unit. Patient was not grossly responding to internal/external stimuli during encounter.    Principal Problem: Schizoaffective disorder, bipolar type ([place]) Diagnosis: Principal Problem:   Schizoaffective disorder, bipolar type ([place]) Active Problems:   Hypertension   Type 2 diabetes mellitus ([place])  Total Time Spent in Direct Patient Care: I personally spent 30 minutes on the unit in direct patient care. The direct patient care time included face-to-face time with the patient, reviewing the patient's  chart, communicating with other professionals, and coordinating care. Greater than 50% of this time was spent in counseling or coordinating care with the patient regarding goals of hospitalization, psycho-education, and discharge planning needs.   Past Psychiatric History: Past psych diagnoses: Schizophrenia Prior inpatient treatment: [facility] ~14x (latest 01/28/2021), [facility] ~2020 Suicide attempts:  Psychiatric med trials: Zyprexa, loxapine, paliperidone injection/156 mg, Geodon Neuromodulation history: N/A Current outpatient psychiatrist:  Current outpatient therapist:  History of selective adherence: Yes.    Past Medical History:  Past Medical History:  Diagnosis Date   Bipolar affective disorder ([place])    takes Zyprexa daily   Coarse tremors 10/02/2014   Diabetes mellitus    takes Victoza,Metformin,and Glipizide daily   Hypertension    takes Amlodipine,Lisinopril and Clonidine daily   Hyponatremia    history of   Lithium toxicity 10/02/2014   Mental disorder    takes Lithium daily   Schizoaffective disorder    takes Trazodone nightly   Schizoaffective disorder ([place]) 01/04/2019   Seasonal allergies    takes Claritin daily   Sleep apnea    sleep study >59yrs ago   Stroke [facility])    left arm weakness    Past Surgical History:  Procedure Laterality Date   CATARACT EXTRACTION W/PHACO Right 02/14/2013   Procedure: CATARACT EXTRACTION PHACO AND INTRAOCULAR LENS PLACEMENT ([place]);  Surgeon: Adonis Brook, MD;  Location: [place];  Service: Ophthalmology;  Laterality: Right;   CATARACT EXTRACTION W/PHACO Left 06/13/2013   Procedure: CATARACT EXTRACTION PHACO AND INTRAOCULAR LENS PLACEMENT (IOC);  Surgeon: Adonis Brook, MD;  Location: [place];  Service: Ophthalmology;  Laterality: Left;   CIRCUMCISION  20 yrs.  ago   EYE SURGERY     Family History: History reviewed. No pertinent family history. Family Psychiatric  History: unable to obtain Social History:  Social History   Substance and  Sexual Activity  Alcohol Use Not Currently     Social History   Substance and Sexual Activity  Drug Use No    Social History   Socioeconomic History   Marital status: Divorced    Spouse name: Not on file   Number of children: Not on file   Years of education: Not on file   Highest education level: Not on file  Occupational History   Not on file  Tobacco Use   Smoking status: Never   Smokeless tobacco: Never  Vaping Use   Vaping Use: Never used  Substance and Sexual Activity   Alcohol use: Not Currently   Drug use: No   Sexual activity: Yes    Birth control/protection: None  Other Topics Concern   Not on file  Social History Narrative   Not on file   Social Determinants of Health   Financial Resource Strain: Not on file  Food Insecurity: Not on file  Transportation Needs: Not on file  Physical Activity: Not on file  Stress: Not on file  Social Connections: Not on file   Additional Social History:  NA    Sleep: Fair  Appetite:  Fair  Current Medications: Current Facility-Administered Medications  Medication Dose Route Frequency Provider Last Rate Last Admin   acetaminophen (TYLENOL) tablet 650 mg  650 mg Oral Q6H PRN Lucky Rathke, FNP       alum & mag hydroxide-simeth (MAALOX/MYLANTA) 200-200-20 MG/5ML suspension 30 mL  30 mL Oral Q4H PRN Lucky Rathke, FNP       amLODipine (NORVASC) tablet 10 mg  10 mg Oral QHS Merrily Brittle, DO   10 mg at 04/25/21 2114   aspirin EC tablet 81 mg  81 mg Oral Daily Lucky Rathke, FNP   81 mg at 04/26/21 0748   atenolol (TENORMIN) tablet 12.5 mg  12.5 mg Oral Daily Lucky Rathke, FNP   12.5 mg at 04/26/21 0747   benztropine (COGENTIN) tablet 0.5 mg  0.5 mg Oral BID Maida Sale, MD   0.5 mg at 04/26/21 1718   cloNIDine (CATAPRES) tablet 0.2 mg  0.2 mg Oral BID Lucky Rathke, FNP   0.2 mg at 04/26/21 1718   cloZAPine (CLOZARIL) tablet 50 mg  50 mg Oral BID Lucky Rathke, FNP   50 mg at 04/26/21 1719   haloperidol  (HALDOL) tablet 20 mg  20 mg Oral QHS Lucky Rathke, FNP   20 mg at 04/25/21 2113   hydrOXYzine (ATARAX) tablet 25 mg  25 mg Oral TID PRN Lucky Rathke, FNP   25 mg at 04/24/21 2049   insulin aspart (novoLOG) injection 0-15 Units  0-15 Units Subcutaneous TID WC Corky Sox, MD   8 Units at 04/26/21 1721   insulin aspart protamine- aspart (NOVOLOG MIX 70/30) injection 32 Units  32 Units Subcutaneous BID WC Corky Sox, MD   32 Units at 04/26/21 1720   lisinopril (ZESTRIL) tablet 40 mg  40 mg Oral QHS Merrily Brittle, DO   40 mg at 04/25/21 2113   lithium carbonate (ESKALITH) CR tablet 450 mg  450 mg Oral Q12H Lucky Rathke, FNP   450 mg at 04/26/21 0749   OLANZapine zydis (ZYPREXA) disintegrating tablet 10 mg  10 mg Oral Q8H PRN Alvie Heidelberg,  Merrilee Seashore, MD       And   LORazepam (ATIVAN) tablet 1 mg  1 mg Oral PRN Corky Sox, MD       And   ziprasidone (GEODON) injection 20 mg  20 mg Intramuscular PRN Corky Sox, MD       magnesium hydroxide (MILK OF MAGNESIA) suspension 30 mL  30 mL Oral Daily PRN Lucky Rathke, FNP       traZODone (DESYREL) tablet 50 mg  50 mg Oral QHS PRN Lucky Rathke, FNP   50 mg at 04/25/21 2113    Lab Results:  Results for orders placed or performed during the hospital encounter of 04/23/21 (from the past 48 hour(s))  Glucose, capillary     Status: Abnormal   Collection Time: 04/24/21  7:34 PM  Result Value Ref Range   Glucose-Capillary 375 (H) 70 - 99 mg/dL    Comment: Glucose reference range applies only to samples taken after fasting for at least 8 hours.  Glucose, capillary     Status: Abnormal   Collection Time: 04/25/21  5:24 AM  Result Value Ref Range   Glucose-Capillary 184 (H) 70 - 99 mg/dL    Comment: Glucose reference range applies only to samples taken after fasting for at least 8 hours.  Glucose, capillary     Status: Abnormal   Collection Time: 04/25/21 12:11 PM  Result Value Ref Range   Glucose-Capillary 220 (H) 70 - 99 mg/dL    Comment:  Glucose reference range applies only to samples taken after fasting for at least 8 hours.  Glucose, capillary     Status: Abnormal   Collection Time: 04/25/21  5:13 PM  Result Value Ref Range   Glucose-Capillary 215 (H) 70 - 99 mg/dL    Comment: Glucose reference range applies only to samples taken after fasting for at least 8 hours.   Comment 1 Notify RN    Comment 2 Document in Chart   Glucose, capillary     Status: Abnormal   Collection Time: 04/25/21  7:27 PM  Result Value Ref Range   Glucose-Capillary 438 (H) 70 - 99 mg/dL    Comment: Glucose reference range applies only to samples taken after fasting for at least 8 hours.  Glucose, capillary     Status: Abnormal   Collection Time: 04/25/21  8:09 PM  Result Value Ref Range   Glucose-Capillary 390 (H) 70 - 99 mg/dL    Comment: Glucose reference range applies only to samples taken after fasting for at least 8 hours.  Glucose, capillary     Status: Abnormal   Collection Time: 04/25/21  8:46 PM  Result Value Ref Range   Glucose-Capillary 376 (H) 70 - 99 mg/dL    Comment: Glucose reference range applies only to samples taken after fasting for at least 8 hours.  Glucose, capillary     Status: Abnormal   Collection Time: 04/25/21  9:47 PM  Result Value Ref Range   Glucose-Capillary 448 (H) 70 - 99 mg/dL    Comment: Glucose reference range applies only to samples taken after fasting for at least 8 hours.  Glucose, capillary     Status: Abnormal   Collection Time: 04/25/21 11:24 PM  Result Value Ref Range   Glucose-Capillary 389 (H) 70 - 99 mg/dL    Comment: Glucose reference range applies only to samples taken after fasting for at least 8 hours.  Glucose, capillary     Status: Abnormal   Collection Time: 04/26/21  1:17 AM  Result Value Ref Range   Glucose-Capillary 191 (H) 70 - 99 mg/dL    Comment: Glucose reference range applies only to samples taken after fasting for at least 8 hours.  Glucose, capillary     Status: Abnormal    Collection Time: 04/26/21  5:47 AM  Result Value Ref Range   Glucose-Capillary 153 (H) 70 - 99 mg/dL    Comment: Glucose reference range applies only to samples taken after fasting for at least 8 hours.  Glucose, capillary     Status: Abnormal   Collection Time: 04/26/21 12:02 PM  Result Value Ref Range   Glucose-Capillary 203 (H) 70 - 99 mg/dL    Comment: Glucose reference range applies only to samples taken after fasting for at least 8 hours.  Glucose, capillary     Status: Abnormal   Collection Time: 04/26/21  5:05 PM  Result Value Ref Range   Glucose-Capillary 274 (H) 70 - 99 mg/dL    Comment: Glucose reference range applies only to samples taken after fasting for at least 8 hours.    Blood Alcohol level:  Lab Results  Component Value Date   ETH <10 04/16/2021   ETH <10 [id number]    Metabolic Disorder Labs: Lab Results  Component Value Date   HGBA1C 9.2 (H) 04/24/2021   MPG 217.34 04/24/2021   MPG 271.87 01/26/2021   No results found for: PROLACTIN Lab Results  Component Value Date   CHOL 140 01/30/2021   TRIG 95 01/30/2021   HDL 49 01/30/2021   CHOLHDL 2.9 01/30/2021   VLDL 19 01/30/2021   LDLCALC 72 01/30/2021   LDLCALC 67 05/20/2020   Psychiatric Specialty Exam: Physical Exam Vitals and nursing note reviewed.  Constitutional:      General: He is not in acute distress.    Appearance: He is not ill-appearing or diaphoretic.  HENT:     Head: Normocephalic.  Pulmonary:     Effort: Pulmonary effort is normal. No respiratory distress.  Neurological:     General: No focal deficit present.     Mental Status: He is alert.  Psychiatric:        Behavior: Behavior is cooperative.    Review of Systems  Respiratory:  Negative for cough and shortness of breath.   Cardiovascular:  Negative for chest pain.  Gastrointestinal:  Negative for abdominal pain, constipation and diarrhea.  Genitourinary:  Negative for dysuria.  Neurological:  Negative for dizziness,  tremors and headaches.   Blood pressure 138/67, pulse 89, temperature 97.9 F (36.6 C), temperature source Oral, resp. rate 16, height 5\' 4"  (1.626 m), weight 98.9 kg, SpO2 100 %.Body mass index is 37.42 kg/m.  General Appearance: elderly male shirtless, in purple scrub bottoms, reclining in bed  Eye Contact:  poor  Speech:  Garbled  Volume:  Normal  Mood:  "GREAT"  Affect: Blunted  Thought Process: Superficially goal directed, linear, coherent  Orientation:  partial  Thought Content:  Patient denied SI/HI/AVH, first rank symptoms. Patient is not grossly responding to internal/external stimuli on exam.  Suicidal Thoughts:  No  Homicidal Thoughts:  No  Memory:  impaired  Judgement:  poor  Insight:  poor  Psychomotor Activity:  normal  Concentration:  fair  Recall:  poor  Fund of Knowledge:  poor  Language:  fair  Akathisia:  no    AIMS (if indicated):   not yet tested  Assets:  Social Support  ADL's:  Intact    Sleep:  Number of  Hours: 7   Treatment Plan Summary: Daily contact with patient to assess and evaluate symptoms and progress in treatment and Medication management  Safety and Monitoring -- INVOLUNTARY admission to inpatient psychiatric unit for safety, stabilization and treatment -- Daily contact with patient to assess and evaluate symptoms and progress in treatment -- Patient's case to be discussed in multi-disciplinary team meeting -- Observation Level : q15 minute checks -- Vital signs:  q12 hours -- Precautions: suicide  Treatment Assessment & Plan Summary: Principal Problem:   Schizoaffective disorder, bipolar type ([place]) Active Problems:   Hypertension   Type 2 diabetes mellitus ([place])    Schizophrenia, acute psychosis Li level 0.89. Patient is still delusional and disorganized. -Continued Haldol 20 mg QHS for psychosis -Continued home clozapine 50 mg BID for psychosis -Continued Cogentin 1 mg to 0.5 mg  BID for EPS prevention -Considered LAI given  lack of compliance and recurrent hospitalizations -Continued Lithium 450 mg BID for mood stability, consider consolidating    T2DM with insulin dependence A1C of 9.2. over the past 24 hrs: fasting CBG of 184, daytime CBGs up to 375. Home regimen of 70/30 48U with breakfast and 42U with supper, prandial aspart 0-9U. -Continued 70/30 32U BID -Continued SSI moderate -Started HS coverage   HTN Continue home regimen -Lisinopril 40 mg qHS -Amlodipine 10 mg daily -Clonidine 0.2 mg BID   Elevated Cr Admission Cr 1.4 on a baseline of 1-1.2. Cr (!) 1.49 on repeat, likely his new baseline.  Elevated transaminases, up trending Follow-up hepatitis panel   Dispo: Pcp, No    Signed: Merrily Brittle, DO Psychiatry Resident, PGY-1 [facility] 04/26/2021, 6:40 PM

## 2021-04-26 NOTE — [facility] Group Notes (Signed)
Adult Psychoeducational Group Not Date:  04/26/2021 Time:  [id number] Group Topic/Focus: PROGRESSIVE RELAXATION. A group where deep breathing is taught and tensing and relaxation muscle groups is used. Imagery is used as well.  Pts are asked to imagine 3 pillars that hold them up when they are not able to hold themselves up.   Additional Comments:  came in and walked out  Mitchell [name] A

## 2021-04-26 NOTE — Group Note (Signed)
[facility] LCSW Group Therapy Note ° °Date/Time:  04/26/2021 11:15am-12:00pm ° °Type of Therapy and Topic:  Group Therapy:  Healthy and Unhealthy Supports ° °Participation Level:  Did Not Attend  ° °Description of Group:  Patients in this group were introduced to the idea of adding a variety of healthy supports to address the various needs in their lives, especially in reference to their plans and focus for the new year.  Patients discussed what additional healthy supports could be helpful in their recovery and wellness after discharge in order to prevent future hospitalizations.   An emphasis was placed on using counselor, doctor, therapy groups, 12-step groups, and problem-specific support groups to expand supports.   ° °Therapeutic Goals: ° ° 1)  discuss importance of adding supports to stay well once out of the hospital ° 2)  compare healthy versus unhealthy supports and identify some examples of each ° 3)  generate ideas and descriptions of healthy supports that can be added ° 4)  offer mutual support about how to address unhealthy supports ° 5)  encourage active participation in and adherence to discharge plan °  ° °Summary of Patient Progress:  The patient was invited to group, did not attend. ° ° °Therapeutic Modalities:   °Motivational Interviewing °Brief Solution-Focused Therapy ° °Godson Pollan Grossman-Orr, LCSW ° °  °   °

## 2021-04-26 NOTE — Progress Notes (Signed)
D:  Mitchell [name] was visible on the unit.  Minimal interaction with staff or peers.  He was noted multiple times pacing the hall and talking to himself.  He continued to refer to himself as "[name]."  He denied any visions.  No SI/HI.  HS blood sugar was 296 and 3 units insulin given.  Staff continued to educate him on drinking no sugar drinks and he was provided with Crystal Light water flavorings.  He took his hs medications without difficulty.  Trazodone given at bedtime with good relief.   A:  1:1 with RN for support and encouragement.  Medications given as ordered.  Q 15 minute checks maintained for safety.  Encouraged participation in group and unit activities.   R:  He is currently resting with his eyes closed and appears to be asleep.  He remains safe on the unit.  We will continue to monitor the progress towards his goals.

## 2021-04-27 LAB — GLUCOSE, CAPILLARY
Glucose-Capillary: 126 mg/dL — ABNORMAL HIGH (ref 70–99)
Glucose-Capillary: 228 mg/dL — ABNORMAL HIGH (ref 70–99)
Glucose-Capillary: 269 mg/dL — ABNORMAL HIGH (ref 70–99)
Glucose-Capillary: 319 mg/dL — ABNORMAL HIGH (ref 70–99)

## 2021-04-27 LAB — HEPATITIS PANEL, ACUTE
HCV Ab: NONREACTIVE
Hep A IgM: NONREACTIVE
Hep B C IgM: NONREACTIVE
Hepatitis B Surface Ag: NONREACTIVE

## 2021-04-27 NOTE — Progress Notes (Signed)
Pt was encouraged but didn't attend orientation/goals group. ?

## 2021-04-27 NOTE — Plan of Care (Signed)
°  Problem: Activity: Goal: Will verbalize the importance of balancing activity with adequate rest periods Outcome: Progressing   Problem: Education: Goal: Knowledge of the prescribed therapeutic regimen will improve Outcome: Progressing   Problem: Coping: Goal: Will verbalize feelings Outcome: Progressing

## 2021-04-27 NOTE — Group Note (Signed)
Recreation Therapy Group Note   Group Topic:Coping Skills  Group Date: 04/27/2021 Start Time: 1000 End Time: 1040 Facilitators: Caroll Rancher, LRT,CTRS Location: 500 Hall Dayroom   Goal Area(s) Addresses:  Patient will define what a coping skills is. Patient will identify benefits of using positive coping skills. Patient will identify benefit of using coping skills post d/c.  Group Description:  Mind Map.  LRT and patients discussed the what a coping skill was and its purpose.  Patients were then given a blank mind map.  LRT and patients filled in the first 8 boxes together with the challenges of depression, listening, anxiety, drugs, alcohol, caring for others, emotions and delusions.  Patients were then given 15 minutes to come up with at least three coping skills for each challenge identified.  The group would then come back together and LRT would write coping skills on board so patients could fill in any blank spots they may have on their mind maps.   Affect/Mood: N/A   Participation Level: None   Participation Quality: None   Behavior: Hallucinating   Speech/Thought Process: None   Insight: None   Judgement: None   Modes of Intervention: Worksheet   Patient Response to Interventions:  Engaged   Education Outcome:  Acknowledges education and In group clarification offered    Clinical Observations/Individualized Feedback: Pt came to group for the last few minutes.  Pt was sitting by the door talking to himself.  Pt had no interaction with anyone.    Plan: Continue to engage patient in RT group sessions 2-3x/week.   Caroll Rancher, LRT,CTRS 04/27/2021 2:21 PM

## 2021-04-27 NOTE — Group Note (Signed)
LCSW Aftercare Discharge Planning Group Note   Type of Group and Topic: Psychoeducational Group: Discharge Planning  Participation Level: None  Description of Group Discharge planning group reviews patients anticipated discharge plans and assists patients to anticipate and address any barriers to wellness/recovery in the community. Suicide prevention education is reviewed with patients in group.  Therapeutic Goals  1. Patients will state their anticipated discharge plan and mental health aftercare  2. Patients will identify potential barriers to wellness in the community setting  3. Patients will engage in problem solving, solution focused discussion of ways to anticipate and address barriers to wellness/recovery  Summary of Patient Progress: Pt came to group however, did not participate and fell asleep during the session.    Therapeutic Modalities: Motivational Interviewing   Ruthann Cancer MSW, LCSW Clincal Social Worker  [facility]

## 2021-04-27 NOTE — [facility] Group Notes (Signed)
[facility] Group Notes:  (Nursing/MHT/Case Management/Adjunct)  Date:  04/25/2021  Time:  8:00pm  Type of Therapy:  Wrap up group  Participation Level:    Participation Quality:    Affect:    Cognitive:    Insight:    Engagement in Group:    Modes of Intervention:  Did not attend despite personal invitation.    Summary of Progress/Problems:  He came to group for less than a minute and returned to his room.  Norm Parcel Shyanna Klingel 04/27/2021, 7:03 AM

## 2021-04-27 NOTE — Progress Notes (Signed)
°   04/27/21 2300  Psych Admission Type (Psych Patients Only)  Admission Status Involuntary  Psychosocial Assessment  Patient Complaints Suspiciousness  Eye Contact Fair  Facial Expression Blank;Flat  Affect Preoccupied;Flat  Speech Slurred  Interaction Assertive  Motor Activity Pacing;Slow  Appearance/Hygiene Disheveled;Poor hygiene  Behavior Characteristics Cooperative  Mood Suspicious;Preoccupied  Thought Process  Coherency Disorganized  Content Delusions;Paranoia  Delusions Grandeur;Paranoid  Perception Hallucinations  Hallucination Auditory  Judgment Poor  Confusion None  Danger to Self  Current suicidal ideation? Denies  Danger to Others  Danger to Others None reported or observed

## 2021-04-27 NOTE — Progress Notes (Signed)
Adult Psychoeducational Group Note  Date:  04/27/2021 Time:  8:38 PM  Group Topic/Focus:  Wrap-Up Group:   The focus of this group is to help patients review their daily goal of treatment and discuss progress on daily workbooks.  Participation Level:  Active  Participation Quality:  Appropriate  Affect:  Appropriate  Cognitive:  Appropriate  Insight: Appropriate  Engagement in Group:  Engaged  Modes of Intervention:  Discussion  Additional Comments:  Pt stated his goal for today was to focus on his treatment plan. Pt stated he accomplished his goal today. Pt stated he did not talked with his doctor or his social worker about his care today. Pt rated his overall day a 10. Pt stated he made no calls today. Pt stated he felt better about himself today. Pt stated he was able to attend all meals. Pt stated he took all medications provided today. Pt stated his appetite was pretty good today. Pt rated sleep last night was pretty good. Pt stated the goal tonight was to get some rest. Pt stated he had no physical pain tonight. Pt deny visual hallucinations and auditory issues tonight. Pt denies thoughts of harming himself or others. Pt stated he would alert staff if anything changed  Candy Sledge 04/27/2021, 8:38 PM

## 2021-04-27 NOTE — Progress Notes (Signed)
[facility] MD Progress Note  04/27/2021 2:32 PM Mitchell [name]  MRN:  [id number] Subjective:   Mitchell [name] is a 61 y.o. male with PPHx of Schizophrenia and multiple hospitalizations, who presented to [facility] via IVC for delusion that he is Mitchell [name] and Mitchell [name] and threatening to jump into a trash compactor, then admitted Involuntary to [facility] for treatment of schizophrenia exacerbated by medication selective adherence.  The patient's chart was reviewed and nursing notes were reviewed. Over the past 24 hrs, there were no documented behavioral issues, no PRN medications given for agitation, and the patient was compliant with scheduled medications. The patient's case was discussed in multidisciplinary team meeting.   On interview and assessment this today, patient was seen in the day room. Patient continues to exhibit poor insight into his hospitalization, delusions and is still disoriented. Patient stated that he is "Mitchell [name]" at a [facility]" that is actually his mansion that he is setting up for his wife.  Stated that it is March 2023, that his year of birth is 2026, and that he is 61 years old.  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  He reported that his sleep, appetite, and mood are "GREAT".  In response to asking about AVH, patient abruptly stated "I want to go home".  In response to where home is patient stated that he lives on [address], an apartment that he lives by himself.  After redirecting, patient denied SI/HI/AVH, paranoia, first rank symptoms, and contracted to safety on the unit. Patient was not grossly responding to internal/external stimuli nor made any delusional statements during encounter.   Principal Problem: Schizoaffective disorder, bipolar type ([place]) Diagnosis: Principal Problem:   Schizoaffective disorder, bipolar type ([place]) Active Problems:   Hypertension   Type 2 diabetes mellitus  ([place])  Total Time Spent in Direct Patient Care: I personally spent 30 minutes on the unit in direct patient care. The direct patient care time included face-to-face time with the patient, reviewing the patient's chart, communicating with other professionals, and coordinating care. Greater than 50% of this time was spent in counseling or coordinating care with the patient regarding goals of hospitalization, psycho-education, and discharge planning needs.   Past Psychiatric History: Past psych diagnoses: Schizophrenia Prior inpatient treatment: [facility] ~14x (latest 01/28/2021), [facility] ~2020 Suicide attempts:  Psychiatric med trials: Zyprexa, loxapine, paliperidone injection/156 mg, Geodon Neuromodulation history: N/A Current outpatient psychiatrist:  Current outpatient therapist:  History of selective adherence: Yes.    Past Medical History:  Past Medical History:  Diagnosis Date   Bipolar affective disorder ([place])    takes Zyprexa daily   Coarse tremors 10/02/2014   Diabetes mellitus    takes Victoza,Metformin,and Glipizide daily   Hypertension    takes Amlodipine,Lisinopril and Clonidine daily   Hyponatremia    history of   Lithium toxicity 10/02/2014   Mental disorder    takes Lithium daily   Schizoaffective disorder    takes Trazodone nightly   Schizoaffective disorder ([place]) 01/04/2019   Seasonal allergies    takes Claritin daily   Sleep apnea    sleep study >50yrs ago   Stroke [facility])    left arm weakness    Past Surgical History:  Procedure Laterality Date   CATARACT EXTRACTION W/PHACO Right 02/14/2013   Procedure: CATARACT EXTRACTION PHACO AND INTRAOCULAR LENS PLACEMENT ([place]);  Surgeon: Adonis Brook, MD;  Location: [employer];  Service:  Ophthalmology;  Laterality: Right;   CATARACT EXTRACTION W/PHACO Left 06/13/2013   Procedure: CATARACT EXTRACTION PHACO AND INTRAOCULAR LENS PLACEMENT ([facility]);  Surgeon: Adonis Brook, MD;  Location: [place];  Service: Ophthalmology;  Laterality: Left;    CIRCUMCISION  20 yrs. ago   EYE SURGERY     Family History: History reviewed. No pertinent family history. Family Psychiatric  History: unable to obtain Social History:  Social History   Substance and Sexual Activity  Alcohol Use Not Currently     Social History   Substance and Sexual Activity  Drug Use No    Social History   Socioeconomic History   Marital status: Divorced    Spouse name: Not on file   Number of children: Not on file   Years of education: Not on file   Highest education level: Not on file  Occupational History   Not on file  Tobacco Use   Smoking status: Never   Smokeless tobacco: Never  Vaping Use   Vaping Use: Never used  Substance and Sexual Activity   Alcohol use: Not Currently   Drug use: No   Sexual activity: Yes    Birth control/protection: None  Other Topics Concern   Not on file  Social History Narrative   Not on file   Social Determinants of Health   Financial Resource Strain: Not on file  Food Insecurity: Not on file  Transportation Needs: Not on file  Physical Activity: Not on file  Stress: Not on file  Social Connections: Not on file   Additional Social History:  NA    Sleep: Fair  Appetite:  Fair  Current Medications: Current Facility-Administered Medications  Medication Dose Route Frequency Provider Last Rate Last Admin   acetaminophen (TYLENOL) tablet 650 mg  650 mg Oral Q6H PRN Lucky Rathke, FNP       alum & mag hydroxide-simeth (MAALOX/MYLANTA) 200-200-20 MG/5ML suspension 30 mL  30 mL Oral Q4H PRN Lucky Rathke, FNP       amLODipine (NORVASC) tablet 10 mg  10 mg Oral QHS Merrily Brittle, DO   10 mg at 04/26/21 2050   aspirin EC tablet 81 mg  81 mg Oral Daily Lucky Rathke, FNP   81 mg at 04/27/21 0759   atenolol (TENORMIN) tablet 12.5 mg  12.5 mg Oral Daily Lucky Rathke, FNP   12.5 mg at 04/27/21 0758   benztropine (COGENTIN) tablet 0.5 mg  0.5 mg Oral BID Maida Sale, MD   0.5 mg at 04/27/21 0758    cloNIDine (CATAPRES) tablet 0.2 mg  0.2 mg Oral BID Lucky Rathke, FNP   0.2 mg at 04/27/21 0758   cloZAPine (CLOZARIL) tablet 50 mg  50 mg Oral BID Lucky Rathke, FNP   50 mg at 04/27/21 0758   haloperidol (HALDOL) tablet 20 mg  20 mg Oral QHS Lucky Rathke, FNP   20 mg at 04/26/21 2050   hydrOXYzine (ATARAX) tablet 25 mg  25 mg Oral TID PRN Lucky Rathke, FNP   25 mg at 04/24/21 2049   insulin aspart (novoLOG) injection 0-15 Units  0-15 Units Subcutaneous TID WC Merrily Brittle, DO   5 Units at 04/27/21 1205   insulin aspart (novoLOG) injection 0-5 Units  0-5 Units Subcutaneous QHS Merrily Brittle, DO   3 Units at 04/26/21 2055   insulin aspart protamine- aspart (NOVOLOG MIX 70/30) injection 32 Units  32 Units Subcutaneous BID WC Corky Sox, MD   32 Units  at 04/27/21 0806   lisinopril (ZESTRIL) tablet 40 mg  40 mg Oral QHS Merrily Brittle, DO   40 mg at 04/26/21 2051   lithium carbonate (ESKALITH) CR tablet 450 mg  450 mg Oral Q12H Lucky Rathke, FNP   450 mg at 04/27/21 0758   OLANZapine zydis (ZYPREXA) disintegrating tablet 10 mg  10 mg Oral Q8H PRN Corky Sox, MD       And   LORazepam (ATIVAN) tablet 1 mg  1 mg Oral PRN Corky Sox, MD       And   ziprasidone (GEODON) injection 20 mg  20 mg Intramuscular PRN Corky Sox, MD       magnesium hydroxide (MILK OF MAGNESIA) suspension 30 mL  30 mL Oral Daily PRN Lucky Rathke, FNP       traZODone (DESYREL) tablet 50 mg  50 mg Oral QHS PRN Lucky Rathke, FNP   50 mg at 04/26/21 2051    Lab Results:  Results for orders placed or performed during the hospital encounter of 04/23/21 (from the past 48 hour(s))  Glucose, capillary     Status: Abnormal   Collection Time: 04/25/21  5:13 PM  Result Value Ref Range   Glucose-Capillary 215 (H) 70 - 99 mg/dL    Comment: Glucose reference range applies only to samples taken after fasting for at least 8 hours.   Comment 1 Notify RN    Comment 2 Document in Chart   Glucose, capillary      Status: Abnormal   Collection Time: 04/25/21  7:27 PM  Result Value Ref Range   Glucose-Capillary 438 (H) 70 - 99 mg/dL    Comment: Glucose reference range applies only to samples taken after fasting for at least 8 hours.  Glucose, capillary     Status: Abnormal   Collection Time: 04/25/21  8:09 PM  Result Value Ref Range   Glucose-Capillary 390 (H) 70 - 99 mg/dL    Comment: Glucose reference range applies only to samples taken after fasting for at least 8 hours.  Glucose, capillary     Status: Abnormal   Collection Time: 04/25/21  8:46 PM  Result Value Ref Range   Glucose-Capillary 376 (H) 70 - 99 mg/dL    Comment: Glucose reference range applies only to samples taken after fasting for at least 8 hours.  Glucose, capillary     Status: Abnormal   Collection Time: 04/25/21  9:47 PM  Result Value Ref Range   Glucose-Capillary 448 (H) 70 - 99 mg/dL    Comment: Glucose reference range applies only to samples taken after fasting for at least 8 hours.  Glucose, capillary     Status: Abnormal   Collection Time: 04/25/21 11:24 PM  Result Value Ref Range   Glucose-Capillary 389 (H) 70 - 99 mg/dL    Comment: Glucose reference range applies only to samples taken after fasting for at least 8 hours.  Glucose, capillary     Status: Abnormal   Collection Time: 04/26/21  1:17 AM  Result Value Ref Range   Glucose-Capillary 191 (H) 70 - 99 mg/dL    Comment: Glucose reference range applies only to samples taken after fasting for at least 8 hours.  Glucose, capillary     Status: Abnormal   Collection Time: 04/26/21  5:47 AM  Result Value Ref Range   Glucose-Capillary 153 (H) 70 - 99 mg/dL    Comment: Glucose reference range applies only to samples taken after fasting for at least  8 hours.  Glucose, capillary     Status: Abnormal   Collection Time: 04/26/21 12:02 PM  Result Value Ref Range   Glucose-Capillary 203 (H) 70 - 99 mg/dL    Comment: Glucose reference range applies only to samples taken  after fasting for at least 8 hours.  Glucose, capillary     Status: Abnormal   Collection Time: 04/26/21  5:05 PM  Result Value Ref Range   Glucose-Capillary 274 (H) 70 - 99 mg/dL    Comment: Glucose reference range applies only to samples taken after fasting for at least 8 hours.  Hepatitis panel, acute     Status: None   Collection Time: 04/26/21  6:22 PM  Result Value Ref Range   Hepatitis B Surface Ag NON REACTIVE NON REACTIVE   HCV Ab NON REACTIVE NON REACTIVE    Comment: (NOTE) Nonreactive HCV antibody screen is consistent with no HCV infections,  unless recent infection is suspected or other evidence exists to indicate HCV infection.     Hep A IgM NON REACTIVE NON REACTIVE   Hep B C IgM NON REACTIVE NON REACTIVE    Comment: Performed at [facility], [place] [address], [place], [place] [address]  Glucose, capillary     Status: Abnormal   Collection Time: 04/26/21  7:37 PM  Result Value Ref Range   Glucose-Capillary 296 (H) 70 - 99 mg/dL    Comment: Glucose reference range applies only to samples taken after fasting for at least 8 hours.  Glucose, capillary     Status: Abnormal   Collection Time: 04/27/21  5:58 AM  Result Value Ref Range   Glucose-Capillary 126 (H) 70 - 99 mg/dL    Comment: Glucose reference range applies only to samples taken after fasting for at least 8 hours.  Glucose, capillary     Status: Abnormal   Collection Time: 04/27/21 11:56 AM  Result Value Ref Range   Glucose-Capillary 228 (H) 70 - 99 mg/dL    Comment: Glucose reference range applies only to samples taken after fasting for at least 8 hours.    Blood Alcohol level:  Lab Results  Component Value Date   ETH <10 04/16/2021   ETH <10 [id number]    Metabolic Disorder Labs: Lab Results  Component Value Date   HGBA1C 9.2 (H) 04/24/2021   MPG 217.34 04/24/2021   MPG 271.87 01/26/2021   No results found for: PROLACTIN Lab Results  Component Value Date   CHOL 140 01/30/2021   TRIG 95  01/30/2021   HDL 49 01/30/2021   CHOLHDL 2.9 01/30/2021   VLDL 19 01/30/2021   LDLCALC 72 01/30/2021   LDLCALC 67 05/20/2020   Psychiatric Specialty Exam: Physical Exam Vitals and nursing note reviewed.  Constitutional:      General: He is not in acute distress.    Appearance: He is not ill-appearing or diaphoretic.  HENT:     Head: Normocephalic.  Pulmonary:     Effort: Pulmonary effort is normal. No respiratory distress.  Neurological:     General: No focal deficit present.     Mental Status: He is alert.  Psychiatric:        Behavior: Behavior is cooperative.    Review of Systems  Respiratory:  Negative for cough and shortness of breath.   Cardiovascular:  Negative for chest pain.  Gastrointestinal:  Negative for abdominal pain, constipation and diarrhea.  Genitourinary:  Negative for dysuria.  Neurological:  Negative for dizziness, tremors and headaches.  Blood pressure 117/66, pulse 96, temperature 97.9 F (36.6 C), temperature source Oral, resp. rate 16, height 5\' 4"  (1.626 m), weight 98.9 kg, SpO2 100 %.Body mass index is 37.42 kg/m.  General Appearance: elderly male, appropriately dressed and groomed  Eye Contact:  poor  Speech: Mostly clear and coherent, at times garbled or mumbling.  Normal speed  Volume:  Normal  Mood:  "GREAT"  Affect: Blunted, dysphoric, irritable  Thought Process: Superficially goal directed, linear, coherent  Orientation:  partial  Thought Content:  Patient denied SI/HI/AVH, first rank symptoms. Patient is not grossly responding to internal/external stimuli on exam.  Delusions of grandeur  Suicidal Thoughts:  No  Homicidal Thoughts:  No  Memory:  impaired  Judgement:  poor  Insight:  poor  Psychomotor Activity:  normal  Concentration:  fair  Recall:  poor  Fund of Knowledge:  poor  Language:  fair  Akathisia:  no    AIMS (if indicated):   not yet tested  Assets:  Social Support  ADL's:  Intact    Sleep:  Number of Hours: 7.5    Treatment Plan Summary: Daily contact with patient to assess and evaluate symptoms and progress in treatment and Medication management  Safety and Monitoring -- INVOLUNTARY admission to inpatient psychiatric unit for safety, stabilization and treatment -- Daily contact with patient to assess and evaluate symptoms and progress in treatment -- Patient's case to be discussed in multi-disciplinary team meeting -- Observation Level : q15 minute checks -- Vital signs:  q12 hours -- Precautions: suicide  Treatment Assessment & Plan Summary: Principal Problem:   Schizoaffective disorder, bipolar type ([place]) Active Problems:   Hypertension   Type 2 diabetes mellitus ([place])    Schizophrenia, acute psychosis Li level 0.89. Patient is still delusional and thought content were organized.  Holding off on up titrating Clozapine, given unclear dispo.  As patient may not be able to follow up with labs and up titration along with medicine adherence -Continued Haldol 20 mg QHS for psychosis -Continued home clozapine 50 mg BID for psychosis -Continued Cogentin 1 mg to 0.5 mg  BID for EPS prevention -Considered LAI given lack of compliance and recurrent hospitalizations -Continued Lithium 450 mg BID for mood stability, consider consolidating    T2DM with insulin dependence A1C of 9.2. over the past 24 hrs: CBGs average mid 200s, with once at 126. Will continue to monitor and adjust insulin accordingly. Home regimen of 70/30 48U with breakfast and 42U with supper, prandial aspart 0-9U. Will consider uptitrating insulin, however given patient's age adjust insulin conservatively to prevent confusion, falls, hypoglycemia -Continued 70/30 32U BID -Continued SSI moderate -Continued HS coverage   HTN Continue home regimen -Lisinopril 40 mg qHS -Amlodipine 10 mg daily -Clonidine 0.2 mg BID   Elevated Cr Admission Cr 1.4 on a baseline of 1-1.2. Cr (!) 1.49 on repeat, likely his new baseline. Repeat CMP  per below  Elevated transaminases, up trending Non-reactive Repeat CMP in about a week to trend (~2/2-2/3)   Dispo: Pcp, No   Signed: Merrily Brittle, DO Psychiatry Resident, PGY-1 [facility] 04/27/2021, 2:32 PM

## 2021-04-27 NOTE — [facility] Suicide Risk Assessment (Signed)
[facility] INPATIENT:  Family/Significant Other Suicide Prevention Education  Suicide Prevention Education:  Patient Refusal for Family/Significant Other Suicide Prevention Education: The patient Mitchell [name] has refused to provide written consent for family/significant other to be provided Family/Significant Other Suicide Prevention Education during admission and/or prior to discharge.  Physician notified.  Herbie Lehrmann A Armanie Martine 04/27/2021, 1:44 PM

## 2021-04-27 NOTE — Progress Notes (Signed)
Progress note  Pt found in bed. Pt compliant with medication administration. Pt continues to respond to internal stimuli. Pt still shows little insight into their disease process or treatment plan. Pt has been seen pacing in the milieu. Pt is pleasant. Pt denies si/hi and verbally agrees to approach staff if these become apparent or before harming themselves/others while at [facility].  A: Pt provided support and encouragement. Pt given medication per protocol and standing orders. Q65m safety checks implemented and continued.  R: Pt safe on the unit. Will continue to monitor.

## 2021-04-28 DIAGNOSIS — F250 Schizoaffective disorder, bipolar type: Secondary | ICD-10-CM | POA: Diagnosis not present

## 2021-04-28 LAB — COMPREHENSIVE METABOLIC PANEL
ALT: 96 U/L — ABNORMAL HIGH (ref 0–44)
AST: 66 U/L — ABNORMAL HIGH (ref 15–41)
Albumin: 3.8 g/dL (ref 3.5–5.0)
Alkaline Phosphatase: 136 U/L — ABNORMAL HIGH (ref 38–126)
Anion gap: 8 (ref 5–15)
BUN: 21 mg/dL — ABNORMAL HIGH (ref 6–20)
CO2: 26 mmol/L (ref 22–32)
Calcium: 9.1 mg/dL (ref 8.9–10.3)
Chloride: 101 mmol/L (ref 98–111)
Creatinine, Ser: 1.38 mg/dL — ABNORMAL HIGH (ref 0.61–1.24)
GFR, Estimated: 59 mL/min — ABNORMAL LOW (ref >60)
Glucose, Bld: 380 mg/dL — ABNORMAL HIGH (ref 70–99)
Potassium: 4.2 mmol/L (ref 3.5–5.1)
Sodium: 135 mmol/L (ref 135–145)
Total Bilirubin: 0.1 mg/dL — ABNORMAL LOW (ref 0.3–1.2)
Total Protein: 7.4 g/dL (ref 6.5–8.1)

## 2021-04-28 LAB — GLUCOSE, CAPILLARY
Glucose-Capillary: 166 mg/dL — ABNORMAL HIGH (ref 70–99)
Glucose-Capillary: 295 mg/dL — ABNORMAL HIGH (ref 70–99)
Glucose-Capillary: 319 mg/dL — ABNORMAL HIGH (ref 70–99)

## 2021-04-28 LAB — TSH: TSH: 2.233 u[IU]/mL (ref 0.350–4.500)

## 2021-04-28 MED ORDER — LISINOPRIL 10 MG PO TABS
10.0000 mg | ORAL_TABLET | Freq: Every day | ORAL | Status: DC
  Filled 2021-04-28: qty 1

## 2021-04-28 MED ORDER — HALOPERIDOL 5 MG PO TABS
10.0000 mg | ORAL_TABLET | Freq: Two times a day (BID) | ORAL | Status: DC
  Administered 2021-04-29 – 2021-04-30 (×4): 10 mg via ORAL
  Filled 2021-04-28 (×8): qty 2

## 2021-04-28 MED ORDER — HALOPERIDOL 5 MG PO TABS
20.0000 mg | ORAL_TABLET | Freq: Every day | ORAL | Status: AC
  Administered 2021-04-28: 20 mg via ORAL
  Filled 2021-04-28: qty 4

## 2021-04-28 MED ORDER — LISINOPRIL 20 MG PO TABS
20.0000 mg | ORAL_TABLET | Freq: Every day | ORAL | Status: DC
  Filled 2021-04-28: qty 1

## 2021-04-28 MED ORDER — LISINOPRIL 20 MG PO TABS
20.0000 mg | ORAL_TABLET | Freq: Every day | ORAL | Status: DC
  Administered 2021-04-28 – 2021-05-26 (×28): 20 mg via ORAL
  Filled 2021-04-28 (×31): qty 1

## 2021-04-28 MED ORDER — LISINOPRIL 20 MG PO TABS: 20.0000 mg | ORAL_TABLET | Freq: Every day | ORAL | Status: DC

## 2021-04-28 MED ORDER — CLOZAPINE 25 MG PO TABS
75.0000 mg | ORAL_TABLET | Freq: Every day | ORAL | Status: DC
  Administered 2021-04-29 – 2021-04-30 (×2): 75 mg via ORAL
  Filled 2021-04-28 (×3): qty 3

## 2021-04-28 MED ORDER — CLOZAPINE 25 MG PO TABS
50.0000 mg | ORAL_TABLET | Freq: Every day | ORAL | Status: DC
  Administered 2021-04-29 – 2021-05-08 (×10): 50 mg via ORAL
  Filled 2021-04-28 (×11): qty 2

## 2021-04-28 NOTE — Plan of Care (Signed)
Pt ambulatory on the unit. Pt in day room appropriate and calm. Pt has also been observed responding in the hallway. Support and encouragement provided, q15 minute safety checks remain in effect. Medications administered per MD orders. No reactions/side effects to medicine noted. Pt remains safe on and off the unit.    Problem: Coping: Goal: Ability to verbalize frustrations and anger appropriately will improve Outcome: Progressing   Problem: Health Behavior/Discharge Planning: Goal: Compliance with treatment plan for underlying cause of condition will improve Outcome: Progressing   Problem: Safety: Goal: Periods of time without injury will increase Outcome: Progressing   Problem: Role Relationship: Goal: Ability to communicate needs accurately will improve Outcome: Progressing   Problem: Safety: Goal: Ability to remain free from injury will improve Outcome: Progressing

## 2021-04-28 NOTE — Progress Notes (Addendum)
Mitchell Seminole MD Progress Note  04/28/2021 7:22 PM Mitchell [name]  MRN:  [id number]  Chief Complaint: delusions and SI  Subjective:   Mitchell [name] is a 61 y.o. male with PPHx of Schizophrenia and multiple hospitalizations, who presented to [name] via [facility] for delusion that he is [name] and [name] and threatening to jump into a trash compactor, then admitted Involuntary to [name] for treatment of schizophrenia exacerbated by medication selective adherence.  Overnight: The patient's chart was reviewed and nursing notes were reviewed. Over the past 24 hrs, there were no documented behavioral issues, no PRN medications given for agitation, and the patient was compliant with scheduled medications. The patient's case was discussed in multidisciplinary team meeting.   On interview and assessment this today, patient was seen in the day room. Patient continues to exhibit poor insight into his hospitalization. He has ongoing delusions and is illogical. Patient stated that he is "Mitchell [name]" at a [name]" that is actually his mansion.  Continued to endorse that he was brought in for cooking at his apartment.  Patient reported correctly the year 2023, however declined to answer other orientation questions.  Stated that he is 61 years old, with date of birth being 04/23/2013. He reported that his sleep, appetite, and mood are "great".  He denied SI/HI/AVH, first rank symptoms, or ideas of reference. He voiced no physical complaints and specifically denied issues with drooling, CP, urinary issues, dizziness, palpitations, SOB, or constipation while on Clozaril.   Principal Problem: Schizoaffective disorder, bipolar type (HCC) Diagnosis: Principal Problem:   Schizoaffective disorder, bipolar type (HCC) Active Problems:   Hypertension   Type 2 diabetes mellitus (HCC)  Total Time Spent in Direct Patient Care: I personally spent 30 minutes on the unit in direct patient care. The  direct patient care time included face-to-face time with the patient, reviewing the patient's chart, communicating with other professionals, and coordinating care. Greater than 50% of this time was spent in counseling or coordinating care with the patient regarding goals of hospitalization, psycho-education, and discharge planning needs.   Past Psychiatric History: Past psych diagnoses: Schizophrenia Prior inpatient treatment: [facility] ~14x (latest 01/28/2021), [facility] ~2020 Suicide attempts:  Psychiatric med trials: Zyprexa, loxapine, paliperidone injection/156 mg, Geodon Neuromodulation history: N/A Current outpatient psychiatrist:  Current outpatient therapist:  History of selective adherence: Yes.    Past Medical History:  Past Medical History:  Diagnosis Date   Bipolar affective disorder (HCC)    takes Zyprexa daily   Coarse tremors 10/02/2014   Diabetes mellitus    takes Victoza,Metformin,and Glipizide daily   Hypertension    takes Amlodipine,Lisinopril and Clonidine daily   Hyponatremia    history of   Lithium toxicity 10/02/2014   Mental disorder    takes Lithium daily   Schizoaffective disorder    takes Trazodone nightly   Schizoaffective disorder (HCC) 01/04/2019   Seasonal allergies    takes Claritin daily   Sleep apnea    sleep study >29yrs ago   Stroke [name])    left arm weakness    Past Surgical History:  Procedure Laterality Date   CATARACT EXTRACTION W/PHACO Right 02/14/2013   Procedure: CATARACT EXTRACTION PHACO AND INTRAOCULAR LENS PLACEMENT (IOC);  Surgeon: Shade Flood, MD;  Location: [facility] OR;  Service: Ophthalmology;  Laterality: Right;   CATARACT EXTRACTION W/PHACO Left 06/13/2013   Procedure: CATARACT EXTRACTION PHACO AND INTRAOCULAR LENS PLACEMENT (IOC);  Surgeon: Shade Flood, MD;  Location: [name] OR;  Service:  Ophthalmology;  Laterality: Left;   CIRCUMCISION  20 yrs. ago   EYE SURGERY     Family History: History reviewed. No pertinent family history. Family  Psychiatric  History: unable to obtain Social History:  Social History   Substance and Sexual Activity  Alcohol Use Not Currently     Social History   Substance and Sexual Activity  Drug Use No    Social History   Socioeconomic History   Marital status: Divorced    Spouse name: Not on file   Number of children: Not on file   Years of education: Not on file   Highest education level: Not on file  Occupational History   Not on file  Tobacco Use   Smoking status: Never   Smokeless tobacco: Never  Vaping Use   Vaping Use: Never used  Substance and Sexual Activity   Alcohol use: Not Currently   Drug use: No   Sexual activity: Yes    Birth control/protection: None  Other Topics Concern   Not on file  Social History Narrative   Not on file   Social Determinants of Health   Financial Resource Strain: Not on file  Food Insecurity: Not on file  Transportation Needs: Not on file  Physical Activity: Not on file  Stress: Not on file  Social Connections: Not on file   Sleep: Perceived as good - slept 4.75 hours  Appetite: Good  Current Medications: Current Facility-Administered Medications  Medication Dose Route Frequency Provider Last Rate Last Admin   acetaminophen (TYLENOL) tablet 650 mg  650 mg Oral Q6H PRN Lucky Rathke, FNP       alum & mag hydroxide-simeth (MAALOX/MYLANTA) 200-200-20 MG/5ML suspension 30 mL  30 mL Oral Q4H PRN Lucky Rathke, FNP       amLODipine (NORVASC) tablet 10 mg  10 mg Oral QHS Merrily Brittle, DO   10 mg at 04/27/21 2043   aspirin EC tablet 81 mg  81 mg Oral Daily Lucky Rathke, FNP   81 mg at 04/28/21 0845   atenolol (TENORMIN) tablet 12.5 mg  12.5 mg Oral Daily Lucky Rathke, FNP   12.5 mg at 04/28/21 0845   cloNIDine (CATAPRES) tablet 0.2 mg  0.2 mg Oral BID Lucky Rathke, FNP   0.2 mg at 04/28/21 1629   [START ON 04/29/2021] cloZAPine (CLOZARIL) tablet 50 mg  50 mg Oral Daily Harlow Asa, MD       [START ON 04/29/2021] cloZAPine  (CLOZARIL) tablet 75 mg  75 mg Oral QHS Harlow Asa, MD       [START ON 04/29/2021] haloperidol (HALDOL) tablet 10 mg  10 mg Oral BID Deitrich Steve E, MD       haloperidol (HALDOL) tablet 20 mg  20 mg Oral QHS Nelda Marseille, Durwood Dittus E, MD       hydrOXYzine (ATARAX) tablet 25 mg  25 mg Oral TID PRN Lucky Rathke, FNP   25 mg at 04/24/21 2049   insulin aspart (novoLOG) injection 0-15 Units  0-15 Units Subcutaneous TID WC Merrily Brittle, DO   11 Units at 04/28/21 1733   insulin aspart (novoLOG) injection 0-5 Units  0-5 Units Subcutaneous QHS Merrily Brittle, DO   4 Units at 04/27/21 2045   insulin aspart protamine- aspart (NOVOLOG MIX 70/30) injection 32 Units  32 Units Subcutaneous BID WC Corky Sox, MD   32 Units at 04/28/21 1735   lisinopril (ZESTRIL) tablet 10 mg  10 mg Oral QHS Nelda Marseille,  Kenlee Maler E, MD       lithium carbonate (ESKALITH) CR tablet 450 mg  450 mg Oral Q12H Lucky Rathke, FNP   450 mg at 04/28/21 0845   OLANZapine zydis (ZYPREXA) disintegrating tablet 10 mg  10 mg Oral Q8H PRN Corky Sox, MD       And   LORazepam (ATIVAN) tablet 1 mg  1 mg Oral PRN Corky Sox, MD       And   ziprasidone (GEODON) injection 20 mg  20 mg Intramuscular PRN Corky Sox, MD       magnesium hydroxide (MILK OF MAGNESIA) suspension 30 mL  30 mL Oral Daily PRN Lucky Rathke, FNP       traZODone (DESYREL) tablet 50 mg  50 mg Oral QHS PRN Lucky Rathke, FNP   50 mg at 04/27/21 2043    Lab Results:  Results for orders placed or performed during the [name] encounter of 04/23/21 (from the past 48 hour(s))  Glucose, capillary     Status: Abnormal   Collection Time: 04/26/21  7:37 PM  Result Value Ref Range   Glucose-Capillary 296 (H) 70 - 99 mg/dL    Comment: Glucose reference range applies only to samples taken after fasting for at least 8 hours.  Glucose, capillary     Status: Abnormal   Collection Time: 04/27/21  5:58 AM  Result Value Ref Range   Glucose-Capillary 126 (H) 70 - 99 mg/dL     Comment: Glucose reference range applies only to samples taken after fasting for at least 8 hours.  Glucose, capillary     Status: Abnormal   Collection Time: 04/27/21 11:56 AM  Result Value Ref Range   Glucose-Capillary 228 (H) 70 - 99 mg/dL    Comment: Glucose reference range applies only to samples taken after fasting for at least 8 hours.  Glucose, capillary     Status: Abnormal   Collection Time: 04/27/21  5:10 PM  Result Value Ref Range   Glucose-Capillary 269 (H) 70 - 99 mg/dL    Comment: Glucose reference range applies only to samples taken after fasting for at least 8 hours.  Glucose, capillary     Status: Abnormal   Collection Time: 04/27/21  7:36 PM  Result Value Ref Range   Glucose-Capillary 319 (H) 70 - 99 mg/dL    Comment: Glucose reference range applies only to samples taken after fasting for at least 8 hours.  Glucose, capillary     Status: Abnormal   Collection Time: 04/28/21  5:35 AM  Result Value Ref Range   Glucose-Capillary 166 (H) 70 - 99 mg/dL    Comment: Glucose reference range applies only to samples taken after fasting for at least 8 hours.   Comment 1 Notify RN    Comment 2 Document in Chart   Glucose, capillary     Status: Abnormal   Collection Time: 04/28/21 11:56 AM  Result Value Ref Range   Glucose-Capillary 295 (H) 70 - 99 mg/dL    Comment: Glucose reference range applies only to samples taken after fasting for at least 8 hours.  Glucose, capillary     Status: Abnormal   Collection Time: 04/28/21  4:56 PM  Result Value Ref Range   Glucose-Capillary 319 (H) 70 - 99 mg/dL    Comment: Glucose reference range applies only to samples taken after fasting for at least 8 hours.   Comment 1 Notify RN    Comment 2 Document in Chart  Blood Alcohol level:  Lab Results  Component Value Date   ETH <10 04/16/2021   ETH <10 [id number]    Metabolic Disorder Labs: Lab Results  Component Value Date   HGBA1C 9.2 (H) 04/24/2021   MPG 217.34 04/24/2021    MPG 271.87 01/26/2021   No results found for: PROLACTIN Lab Results  Component Value Date   CHOL 140 01/30/2021   TRIG 95 01/30/2021   HDL 49 01/30/2021   CHOLHDL 2.9 01/30/2021   VLDL 19 01/30/2021   LDLCALC 72 01/30/2021   LDLCALC 67 05/20/2020   Psychiatric Specialty Exam: Physical Exam Vitals and nursing note reviewed.  Constitutional:      General: He is not in acute distress.    Appearance: He is not ill-appearing or diaphoretic.  HENT:     Head: Normocephalic.  Pulmonary:     Effort: Pulmonary effort is normal. No respiratory distress.  Neurological:     General: No focal deficit present.     Mental Status: He is alert.     Gait: Gait normal.  Psychiatric:        Behavior: Behavior is cooperative.    Review of Systems  HENT:  Positive for drooling (Only when sleeping).   Respiratory:  Negative for shortness of breath.   Cardiovascular:  Negative for chest pain and palpitations.  Gastrointestinal:  Negative for constipation, diarrhea and nausea.  Genitourinary:  Negative for difficulty urinating.  Musculoskeletal:  Negative for myalgias.  Neurological:  Negative for dizziness, tremors, seizures, speech difficulty, weakness and headaches.   Body mass index is 37.42 kg/m. Temp:  [98.3 F (36.8 C)] 98.3 F (36.8 C) (01/31 0553) Pulse Rate:  [86-93] 86 (01/31 1609) BP: (109-145)/(78-91) 138/80 (01/31 1609) SpO2:  [100 %] 100 % (01/31 0553)  General Appearance:  Appropriate for Environment; Casual; Fairly Groomed    Eye Contact:   Fair    Speech:   Clear and Coherent; Normal Rate (Spontaneous)    Volume:   Normal    Mood:   Described as "good" but appears easily frustrated on exam     Affect:   Constricted and at times irritable    Thought Process:   Concrete and tangential   Duration of Psychotic Symptoms:  Greater than six months  Past Diagnosis of Schizophrenia or Psychoactive disorder:  Yes   Orientation:   Oriented to year - but would not  answer additional questions - thinks he is in "a mansion" and not a [name]    Thought Content:  Illogical - Has delusions that he is [name], living in a mansion, is 61y/o; denies AVH, paranoia, ideas of reference, or first rank symptoms; is not grossly responding to internal/external stimuli on exam  Hallucinations: None Ideas of reference: None    Suicidal Thoughts:   No    Homicidal Thoughts:   No    Memory:   Immediate Poor; Recent Poor; Remote Poor    Judgement:   Impaired    Insight:   Lacking    Psychomotor Activity:   Normal    Concentration:   Fair    Attention Span:   Fair   Recall:   Poor    Fund of Knowledge:   Limited    Language:   Good    Handed:   Right    Assets:   Leisure Time    ADL's:  Intact  Sleep:    Number of Hours: 4.75  AIMS: Facial and Oral Movements Muscles of Facial Expression:  None, normal Lips and Perioral Area: None, normal Jaw: None, normal Tongue: None, normal,Extremity Movements Upper (arms, wrists, hands, fingers): None, normal Lower (legs, knees, ankles, toes): None, normal, Trunk Movements Neck, shoulders, hips: None, normal, Overall Severity Severity of abnormal movements (highest score from questions above): None, normal Incapacitation due to abnormal movements: None, normal Patient's awareness of abnormal movements (rate only patient's report): No Awareness, Dental Status Current problems with teeth and/or dentures?: No Does patient usually wear dentures?: No        Treatment Plan Summary: Daily contact with patient to assess and evaluate symptoms and progress in treatment and Medication management  Safety and Monitoring -- INVOLUNTARY admission to inpatient psychiatric unit for safety, stabilization and treatment -- Daily contact with patient to assess and evaluate symptoms and progress in treatment -- Patient's case to be discussed in multi-disciplinary team meeting -- Observation Level : q15  minute checks -- Vital signs:  q12 hours -- Precautions: suicide  Treatment Assessment & Plan Summary: Principal Problem:   Schizoaffective disorder, bipolar type ([place]) Active Problems:   Hypertension   Type 2 diabetes mellitus ([place])   Schizoaffective d/o bipolar type -- ACTT referral per CSW, will be able to follow up and assist patient with monitoring required with Clozaril.   -Changed Haldol 20 mg QHS to 10 mg twice daily for psychosis starting 2/1 -Started uptitration of clozapine to 50 mg qAM & 75 mg qPM  -checking CBC, troponin and CRP weekly with next lab on 2/3; WBC 6.8 and ANC 3200 on 04/24/21) - QTC 47ms and will monitor QTC serially with titration up on Clozaril - A1c 9.2 and lipids WNL in 11/22 -Discontinued Cogentin BID for EPS prevention given additive anti-cholinergic side effects with clozaril -Considered LAI given lack of compliance and recurrent hospitalizations -Continued Lithium 450 mg BID for mood stability - Li level 0.89 - If creatinine worsens may have to be taken off Lithium; - TSH pending and repeat CMP pending   T2DM with insulin dependence A1C of 9.2. Home regimen of 70/30 48U with breakfast and 42U with supper, prandial aspart 0-9U. Diabetic coordinator on-board for insulin management -Continued insulin per diabetic coordinator -Continued SSI moderate -Continued HS coverage   HTN Continue home regimen -Lisinopril reduced to 10mg  per below -Amlodipine 10 mg daily -Clonidine 0.2 mg BID - Atenolol 12.5mg  daily   Elevated Cr Admission Cr 1.4 on a baseline of 1-1.2. Cr (!) 1.49 on repeat. Suspect multifactorial, 2/2 poor PO intake prior to admission, T2DM, lisinopril, Lithium use -Decreased lisinopril to 20mg  from 40mg  -Medicine consult 2/1 -Repeat CMP pending and fluid hydration encouraged - may have to come off Lithium if creatinine worsens  Elevated transaminases, up trending - Hepatitis panel nonreactive, ammonia 35, negative RUQ u/s  02/10/21 - F/u CMP for trending - Medicine consult 2/1  Hyponatremia - likely secondary to elevated blood sugars - repeating CMP for trending    Signed: Merrily Brittle, DO Psychiatry Resident, PGY-1 [name] 04/28/2021, 7:22 PM

## 2021-04-28 NOTE — Progress Notes (Signed)
Adult Psychoeducational Group Note  Date:  04/28/2021 Time:  8:43 PM  Group Topic/Focus:  Wrap-Up Group:   The focus of this group is to help patients review their daily goal of treatment and discuss progress on daily workbooks.  Participation Level:  Active  Participation Quality:  Appropriate  Affect:  Appropriate  Cognitive:  Appropriate  Insight: Appropriate  Engagement in Group:  Engaged  Modes of Intervention:  Discussion  Additional Comments:   Pt stated his goal for today was to focus on his treatment plan. Pt stated he accomplished his goal today. Pt stated he did not talked with his doctor or his social worker about his care today. Pt rated his overall day a 10. Pt stated he made no calls today. Pt stated he felt better about himself today. Pt stated he was able to attend all meals. Pt stated he took all medications provided today. Pt stated his appetite was pretty good today. Pt rated sleep last night was pretty good. Pt stated the goal tonight was to get some rest. Pt stated he had no physical pain tonight. Pt deny visual hallucinations and auditory issues tonight. Pt denies thoughts of harming himself or others. Pt stated he would alert staff if anything changed  [name] 04/28/2021, 8:43 PM

## 2021-04-28 NOTE — Progress Notes (Signed)
Pt continues to respond, but continues to be pleasant, EKG completed    04/28/21 2300  Psych Admission Type (Psych Patients Only)  Admission Status Involuntary  Psychosocial Assessment  Patient Complaints Suspiciousness  Eye Contact Fair  Facial Expression Flat  Affect Preoccupied  Speech Slurred  Interaction Assertive  Motor Activity Slow  Appearance/Hygiene Disheveled  Behavior Characteristics Cooperative  Mood Suspicious;Preoccupied  Thought Chartered certified accountant of ideas  Content Magical thinking  Delusions Grandeur;Paranoid  Perception Hallucinations  Hallucination Auditory  Judgment Impaired  Confusion None  Danger to Self  Current suicidal ideation? Denies  Danger to Others  Danger to Others None reported or observed

## 2021-04-28 NOTE — Group Note (Signed)
Recreation Therapy Group Note   Group Topic:Stress Management  Group Date: 04/28/2021 Start Time: 1000 End Time: 1030 Facilitators: Caroll Rancher, LRT,CTRS Location: [address] Dayroom   Goal Area(s) Addresses:  Patient will identify positive stress management techniques. Patient will identify benefits of using stress management post d/c.  Group Description:  Meditation.  LRT and patients discussed stress and ways to deal with it.  Patients revealed what their biggest stressors were.  LRT then introduced patients to techniques that could be helpful in dealing with stressors such as deep breathing, guided imagery and meditation.  LRT then lead group in a meditation that focused on finding tranquility for peace, calm and relaxation.  Patients were to listen and follow along as meditation played to fully engage in the activity.   Affect/Mood: Tired   Participation Level: Minimal   Participation Quality: Independent   Behavior: Sleep   Speech/Thought Process: Irrational   Insight: Impaired   Judgement: Impaired   Modes of Intervention: Meditation   Patient Response to Interventions:  Receptive   Education Outcome:  Acknowledges education and In group clarification offered    Clinical Observations/Individualized Feedback: Pt was quiet throughout group and attentive as group started.  As soon as the meditation started, pt went right to sleep.  Pt slept throughout the meditation.  Pt woke up when he let out a loud sneeze saying that was the best one he ever had and associating that with the meditation.  Pt was pleasant as group wrapped up.     Plan: Continue to engage patient in RT group sessions 2-3x/week.   Caroll Rancher, LRT,CTRS 04/28/2021 1:18 PM

## 2021-04-28 NOTE — Progress Notes (Signed)
°   04/28/21 0600  Sleep  Number of Hours 4.75

## 2021-04-28 NOTE — Progress Notes (Signed)
Inpatient Diabetes Program Recommendations  AACE/ADA: New Consensus Statement on Inpatient Glycemic Control (2015)  Target Ranges:  Prepandial:   less than 140 mg/dL      Peak postprandial:   less than 180 mg/dL (1-2 hours)      Critically ill patients:  140 - 180 mg/dL   Lab Results  Component Value Date   GLUCAP 166 (H) 04/28/2021   HGBA1C 9.2 (H) 04/24/2021    Review of Glycemic Control  Latest Reference Range & Units 04/27/21 05:58 04/27/21 11:56 04/27/21 17:10 04/27/21 19:36 04/28/21 05:35  Glucose-Capillary 70 - 99 mg/dL 921 (H) 194 (H) 174 (H) 319 (H) 166 (H)  (H): Data is abnormally high  Diabetes history: DM2 Outpatient Diabetes medications: 70/30 48 units with BF and 42 with supper,  Glipizide 20 qd Current orders for Inpatient glycemic control: 70/30 32 bid Novolog 0-9 units TID  Inpatient Diabetes Program Recommendations:   Please consider: -Increase am 70/30 dose to 40 units Secure chat sent to Dr. Loleta Chance.  Thank you, Billy Fischer. Karesa Maultsby, RN, MSN, CDE  Diabetes Coordinator Inpatient Glycemic Control Team Team Pager [phone] (8am-5pm) 04/28/2021 10:45 AM

## 2021-04-28 NOTE — Group Note (Signed)
Date:  04/28/2021 Time:  9:11 AM  Group Topic/Focus:  Orientation:   The focus of this group is to educate the patient on the purpose and policies of crisis stabilization and provide a format to answer questions about their admission.  The group details unit policies and expectations of patients while admitted.   Modes of Intervention:  Activity  Additional Comments:  Pt did not have a goal to share in the group.  Deforest Hoyles Ottis Sarnowski 04/28/2021, 9:11 AM

## 2021-04-29 ENCOUNTER — Encounter (HOSPITAL_COMMUNITY): Payer: Self-pay

## 2021-04-29 DIAGNOSIS — F250 Schizoaffective disorder, bipolar type: Secondary | ICD-10-CM | POA: Diagnosis not present

## 2021-04-29 DIAGNOSIS — F209 Schizophrenia, unspecified: Secondary | ICD-10-CM | POA: Diagnosis present

## 2021-04-29 LAB — GLUCOSE, CAPILLARY
Glucose-Capillary: 314 mg/dL — ABNORMAL HIGH (ref 70–99)
Glucose-Capillary: 164 mg/dL — ABNORMAL HIGH (ref 70–99)
Glucose-Capillary: 335 mg/dL — ABNORMAL HIGH (ref 70–99)
Glucose-Capillary: 280 mg/dL — ABNORMAL HIGH (ref 70–99)
Glucose-Capillary: 287 mg/dL — ABNORMAL HIGH (ref 70–99)

## 2021-04-29 MED ORDER — INSULIN ASPART PROT & ASPART (70-30 MIX) 100 UNIT/ML ~~LOC~~ SUSP
40.0000 [IU] | Freq: Every day | SUBCUTANEOUS | Status: DC
  Administered 2021-04-30 – 2021-05-01 (×2): 40 [IU] via SUBCUTANEOUS

## 2021-04-29 MED ORDER — INSULIN ASPART PROT & ASPART (70-30 MIX) 100 UNIT/ML ~~LOC~~ SUSP
35.0000 [IU] | Freq: Every day | SUBCUTANEOUS | Status: DC
  Administered 2021-04-29 – 2021-05-25 (×27): 35 [IU] via SUBCUTANEOUS

## 2021-04-29 MED ORDER — INSULIN ASPART PROT & ASPART (70-30 MIX) 100 UNIT/ML ~~LOC~~ SUSP: 35.0000 [IU] | Freq: Two times a day (BID) | SUBCUTANEOUS | Status: DC

## 2021-04-29 MED ORDER — SENNA 8.6 MG PO TABS
1.0000 | ORAL_TABLET | Freq: Every day | ORAL | Status: DC
  Administered 2021-04-29 – 2021-05-04 (×6): 8.6 mg via ORAL
  Filled 2021-04-29 (×8): qty 1

## 2021-04-29 NOTE — Progress Notes (Signed)
°   04/29/21 0545  Sleep  Number of Hours 7.25

## 2021-04-29 NOTE — Group Note (Signed)
Recreation Therapy Group Note   Group Topic:Team Building  Group Date: 04/29/2021 Start Time: 0955 End Time: 1020 Facilitators: Caroll Rancher, LRT,CTRS Location: [address] Dayroom   Goal Area(s) Addresses:  Patient will effectively work with peer towards shared goal.  Patient will identify skills used to make activity successful.  Patient will identify how skills used during activity can be applied to reach post d/c goals.   Group Description: [employer]. In teams of 5-6, patients were given 25 small craft pipe cleaners. Using the materials provided, patients were instructed to compete against the opposing team(s) to build the tallest free-standing structure from floor level. The activity was timed; difficulty increased by Clinical research associate as Production designer, theatre/television/film continued.  Systematically resources were removed with additional directions for example, placing one arm behind their back, working in silence, and shape stipulations. LRT facilitated post-activity discussion reviewing team processes and necessary communication skills involved in completion. Patients were encouraged to reflect how the skills utilized, or not utilized, in this activity can be incorporated to positively impact support systems post discharge.   Affect/Mood: N/A   Participation Level: Did not attend    Clinical Observations/Individualized Feedback:     Plan: Continue to engage patient in RT group sessions 2-3x/week.   Caroll Rancher, LRT,CTRS 04/29/2021 11:22 AM

## 2021-04-29 NOTE — Progress Notes (Addendum)
[facility] MD Progress Note  04/29/2021 3:17 PM Mitchell [name]  MRN:  [id number]  Chief Complaint: delusions and SI  Subjective:   Mitchell [name] is a 61 y.o. male with PPHx of Schizophrenia and multiple hospitalizations, who presented to [facility] via IVC for delusion that he is [name] and threatening to jump into a trash compactor, then admitted Involuntary to [facility] for treatment of schizophrenia exacerbated by medication selective adherence.  Overnight: The patient's chart was reviewed and nursing notes were reviewed. Over the past 24 hrs, there were no documented behavioral issues, no PRN medications given for agitation, and the patient was compliant with scheduled medications. The patient's case was discussed in multidisciplinary team meeting.    On interview and assessment this today, patient was seen evaluated with attending in patient room on 500 hall. He continued to exhibit poor insight to his hospitalization and has ongoing paranoid, delusions, and illogical thought process. He was A&O to initially stated Jan, but corrected to Feb and correctly stated 2023 and that he is in [place]. Patient stated that he is "Mitchell [name]" and that he is at a hospital but continues to refer to the hospital as a mansion and his home.  He stated that "there was no reason why I was admitted" and people are jealous of him. He reported that his sleep, appetite, and mood are "great". He denied SI/HI/AVH, first rank symptoms, or ideas of reference. He voiced no physical complaints and specifically denied issues with drooling, CP, urinary issues, dizziness, palpitations, SOB, or constipation while on Clozaril. He was advised that his creatinine is improving and was encouraged to continue po hydration. He shows no insight into his DM and was counseled on healthier eating given his FSBS elevation.  Principal Problem: Schizophrenia ([place]) Diagnosis: Principal Problem:   Schizophrenia  ([place]) Active Problems:   Hypertension   Type 2 diabetes mellitus ([place])  Total Time Spent in Direct Patient Care: I personally spent 30 minutes on the unit in direct patient care. The direct patient care time included face-to-face time with the patient, reviewing the patient's chart, communicating with other professionals, and coordinating care. Greater than 50% of this time was spent in counseling or coordinating care with the patient regarding goals of hospitalization, psycho-education, and discharge planning needs.   Past Psychiatric History: Past psych diagnoses: Schizophrenia Prior inpatient treatment: [facility] ~14x (latest 01/28/2021), [facility] ~2020 Suicide attempts:  Psychiatric med trials: Zyprexa, loxapine, paliperidone injection/156 mg, Geodon Neuromodulation history: N/A Current outpatient psychiatrist:  Current outpatient therapist:  History of selective adherence: Yes.    Past Medical History:  Past Medical History:  Diagnosis Date   Bipolar affective disorder ([place])    takes Zyprexa daily   Coarse tremors 10/02/2014   Diabetes mellitus    takes Victoza,Metformin,and Glipizide daily   Hypertension    takes Amlodipine,Lisinopril and Clonidine daily   Hyponatremia    history of   Lithium toxicity 10/02/2014   Mental disorder    takes Lithium daily   Schizoaffective disorder    takes Trazodone nightly   Schizoaffective disorder ([place]) 01/04/2019   Seasonal allergies    takes Claritin daily   Sleep apnea    sleep study >73yrs ago   Stroke [facility])    left arm weakness    Past Surgical History:  Procedure Laterality Date   CATARACT EXTRACTION W/PHACO Right 02/14/2013   Procedure: CATARACT EXTRACTION PHACO AND INTRAOCULAR LENS PLACEMENT ([place]);  Surgeon: Adonis Brook,  MD;  Location: [place];  Service: Ophthalmology;  Laterality: Right;   CATARACT EXTRACTION W/PHACO Left 06/13/2013   Procedure: CATARACT EXTRACTION PHACO AND INTRAOCULAR LENS PLACEMENT ([facility]);  Surgeon: Adonis Brook,  MD;  Location: [place];  Service: Ophthalmology;  Laterality: Left;   CIRCUMCISION  20 yrs. ago   EYE SURGERY     Family History:see H&P  Family Psychiatric  History: unable to obtain  Social History:  Social History   Substance and Sexual Activity  Alcohol Use Not Currently     Social History   Substance and Sexual Activity  Drug Use No    Social History   Socioeconomic History   Marital status: Divorced    Spouse name: Not on file   Number of children: Not on file   Years of education: Not on file   Highest education level: Not on file  Occupational History   Not on file  Tobacco Use   Smoking status: Never   Smokeless tobacco: Never  Vaping Use   Vaping Use: Never used  Substance and Sexual Activity   Alcohol use: Not Currently   Drug use: No   Sexual activity: Yes    Birth control/protection: None  Other Topics Concern   Not on file  Social History Narrative   Not on file   Social Determinants of Health   Financial Resource Strain: Not on file  Food Insecurity: Not on file  Transportation Needs: Not on file  Physical Activity: Not on file  Stress: Not on file  Social Connections: Not on file   Sleep: Perceived as good - slept 4.75 hours  Appetite: Good  Current Medications: Current Facility-Administered Medications  Medication Dose Route Frequency Provider Last Rate Last Admin   acetaminophen (TYLENOL) tablet 650 mg  650 mg Oral Q6H PRN Lucky Rathke, FNP       alum & mag hydroxide-simeth (MAALOX/MYLANTA) 200-200-20 MG/5ML suspension 30 mL  30 mL Oral Q4H PRN Lucky Rathke, FNP       amLODipine (NORVASC) tablet 10 mg  10 mg Oral QHS Merrily Brittle, DO   10 mg at 04/28/21 2041   aspirin EC tablet 81 mg  81 mg Oral Daily Lucky Rathke, FNP   81 mg at 04/29/21 0731   atenolol (TENORMIN) tablet 12.5 mg  12.5 mg Oral Daily Lucky Rathke, FNP   12.5 mg at 04/29/21 0731   cloNIDine (CATAPRES) tablet 0.2 mg  0.2 mg Oral BID Lucky Rathke, FNP   0.2 mg at  04/29/21 0732   cloZAPine (CLOZARIL) tablet 50 mg  50 mg Oral Daily Nelda Marseille, Shamiah Kahler E, MD   50 mg at 04/29/21 [id number]   cloZAPine (CLOZARIL) tablet 75 mg  75 mg Oral QHS Zebedee Segundo E, MD       haloperidol (HALDOL) tablet 10 mg  10 mg Oral BID Nelda Marseille, Ammie Warrick E, MD   10 mg at 04/29/21 [id number]   hydrOXYzine (ATARAX) tablet 25 mg  25 mg Oral TID PRN Lucky Rathke, FNP   25 mg at 04/28/21 2041   insulin aspart (novoLOG) injection 0-15 Units  0-15 Units Subcutaneous TID WC Merrily Brittle, DO   11 Units at 04/29/21 1211   insulin aspart (novoLOG) injection 0-5 Units  0-5 Units Subcutaneous QHS Merrily Brittle, DO   4 Units at 04/28/21 2043   insulin aspart protamine- aspart (NOVOLOG MIX 70/30) injection 35 Units  35 Units Subcutaneous Q supper Merrily Brittle, DO       [  START ON 04/30/2021] insulin aspart protamine- aspart (NOVOLOG MIX 70/30) injection 40 Units  40 Units Subcutaneous Q breakfast Merrily Brittle, DO       lisinopril (ZESTRIL) tablet 20 mg  20 mg Oral QHS Nelda Marseille, Johann Gascoigne E, MD   20 mg at 04/28/21 2043   lithium carbonate (ESKALITH) CR tablet 450 mg  450 mg Oral Q12H Lucky Rathke, FNP   450 mg at 04/29/21 0731   OLANZapine zydis (ZYPREXA) disintegrating tablet 10 mg  10 mg Oral Q8H PRN Corky Sox, MD       And   LORazepam (ATIVAN) tablet 1 mg  1 mg Oral PRN Corky Sox, MD       And   ziprasidone (GEODON) injection 20 mg  20 mg Intramuscular PRN Corky Sox, MD       magnesium hydroxide (MILK OF MAGNESIA) suspension 30 mL  30 mL Oral Daily PRN Lucky Rathke, FNP       senna (SENOKOT) tablet 8.6 mg  1 tablet Oral Daily Merrily Brittle, DO   8.6 mg at 04/29/21 1442   traZODone (DESYREL) tablet 50 mg  50 mg Oral QHS PRN Lucky Rathke, FNP   50 mg at 04/28/21 2041    Lab Results:  Results for orders placed or performed during the hospital encounter of 04/23/21 (from the past 48 hour(s))  Glucose, capillary     Status: Abnormal   Collection Time: 04/27/21  5:10 PM  Result Value Ref Range    Glucose-Capillary 269 (H) 70 - 99 mg/dL    Comment: Glucose reference range applies only to samples taken after fasting for at least 8 hours.  Glucose, capillary     Status: Abnormal   Collection Time: 04/27/21  7:36 PM  Result Value Ref Range   Glucose-Capillary 319 (H) 70 - 99 mg/dL    Comment: Glucose reference range applies only to samples taken after fasting for at least 8 hours.  Glucose, capillary     Status: Abnormal   Collection Time: 04/28/21  5:35 AM  Result Value Ref Range   Glucose-Capillary 166 (H) 70 - 99 mg/dL    Comment: Glucose reference range applies only to samples taken after fasting for at least 8 hours.   Comment 1 Notify RN    Comment 2 Document in Chart   Glucose, capillary     Status: Abnormal   Collection Time: 04/28/21 11:56 AM  Result Value Ref Range   Glucose-Capillary 295 (H) 70 - 99 mg/dL    Comment: Glucose reference range applies only to samples taken after fasting for at least 8 hours.  Glucose, capillary     Status: Abnormal   Collection Time: 04/28/21  4:56 PM  Result Value Ref Range   Glucose-Capillary 319 (H) 70 - 99 mg/dL    Comment: Glucose reference range applies only to samples taken after fasting for at least 8 hours.   Comment 1 Notify RN    Comment 2 Document in Chart   Comprehensive metabolic panel     Status: Abnormal   Collection Time: 04/28/21  7:08 PM  Result Value Ref Range   Sodium 135 135 - 145 mmol/L   Potassium 4.2 3.5 - 5.1 mmol/L   Chloride 101 98 - 111 mmol/L   CO2 26 22 - 32 mmol/L   Glucose, Bld 380 (H) 70 - 99 mg/dL    Comment: Glucose reference range applies only to samples taken after fasting for at least 8 hours.   BUN  21 (H) 6 - 20 mg/dL   Creatinine, Ser 1.38 (H) 0.61 - 1.24 mg/dL   Calcium 9.1 8.9 - 10.3 mg/dL   Total Protein 7.4 6.5 - 8.1 g/dL   Albumin 3.8 3.5 - 5.0 g/dL   AST 66 (H) 15 - 41 U/L   ALT 96 (H) 0 - 44 U/L   Alkaline Phosphatase 136 (H) 38 - 126 U/L   Total Bilirubin 0.1 (L) 0.3 - 1.2 mg/dL    GFR, Estimated 59 (L) >60 mL/min    Comment: (NOTE) Calculated using the CKD-EPI Creatinine Equation (2021)    Anion gap 8 5 - 15    Comment: Performed at [facility], [place] [address], [place], [place] [address]  TSH     Status: None   Collection Time: 04/28/21  7:08 PM  Result Value Ref Range   TSH 2.233 0.350 - 4.500 uIU/mL    Comment: Performed by a 3rd Generation assay with a functional sensitivity of <=0.01 uIU/mL. Performed at [facility], [place] [address] [place], [place] [address]   Glucose, capillary     Status: Abnormal   Collection Time: 04/28/21  7:40 PM  Result Value Ref Range   Glucose-Capillary 314 (H) 70 - 99 mg/dL    Comment: Glucose reference range applies only to samples taken after fasting for at least 8 hours.  Glucose, capillary     Status: Abnormal   Collection Time: 04/29/21  5:29 AM  Result Value Ref Range   Glucose-Capillary 164 (H) 70 - 99 mg/dL    Comment: Glucose reference range applies only to samples taken after fasting for at least 8 hours.  Glucose, capillary     Status: Abnormal   Collection Time: 04/29/21 11:56 AM  Result Value Ref Range   Glucose-Capillary 335 (H) 70 - 99 mg/dL    Comment: Glucose reference range applies only to samples taken after fasting for at least 8 hours.    Blood Alcohol level:  Lab Results  Component Value Date   ETH <10 04/16/2021   ETH <10 [id number]    Metabolic Disorder Labs: Lab Results  Component Value Date   HGBA1C 9.2 (H) 04/24/2021   MPG 217.34 04/24/2021   MPG 271.87 01/26/2021   No results found for: PROLACTIN Lab Results  Component Value Date   CHOL 140 01/30/2021   TRIG 95 01/30/2021   HDL 49 01/30/2021   CHOLHDL 2.9 01/30/2021   VLDL 19 01/30/2021   LDLCALC 72 01/30/2021   LDLCALC 67 05/20/2020   Psychiatric Specialty Exam: Physical Exam Vitals and nursing note reviewed.  Constitutional:      Appearance: He is diaphoretic.     Comments:  Sitting in room with heat on 90 degrees  HENT:     Head: Normocephalic.  Pulmonary:     Effort: No respiratory distress.  Neurological:     General: No focal deficit present.     Mental Status: He is alert.     Gait: Gait normal.  Psychiatric:        Behavior: Behavior is cooperative.    Review of Systems  HENT:  Negative for drooling (Only when sleeping).   Respiratory:  Negative for shortness of breath.   Cardiovascular:  Negative for chest pain and palpitations.  Gastrointestinal:  Negative for constipation, diarrhea and nausea.  Genitourinary:  Negative for difficulty urinating.  Musculoskeletal:  Negative for myalgias.  Neurological:  Negative for dizziness, tremors, seizures, speech difficulty, weakness, light-headedness and headaches.  Body mass index is 37.42 kg/m. Temp:  [98.5 F (36.9 C)] 98.5 F (36.9 C) (02/01 0555) Pulse Rate:  [86-96] 96 (02/01 0557) BP: (128-138)/(80-85) 134/84 (02/01 0557) SpO2:  [97 %-99 %] 99 % (02/01 0557)  General Appearance:  Appropriate for Environment; Casual; Fairly Groomed    Eye Contact:   Fair    Speech:   Clear and Coherent; Normal Rate (Spontaneous)    Volume:   Normal    Mood:   Described as "great" but appeared irritable on exam     Affect:   Constricted and at times irritable when asking A&O questions    Thought Process:   Concrete and tangential   Duration of Psychotic Symptoms:  Greater than six months  Past Diagnosis of Schizophrenia or Psychoactive disorder:  Yes   Orientation:   He initially stated Jan, but corrected to Feb and correctly stated 2023 and that he is in [place]. Thinks he is in "a mansion" but also recognizes this is a hospital - not oriented to reason for admission    Thought Content:  Illogical - Has delusions that he is [name], living in a mansion, denies AVH, ideas of reference, or first rank symptoms; is not grossly responding to internal/external stimuli on  exam  Hallucinations: None Ideas of reference: None    Suicidal Thoughts:   No    Homicidal Thoughts:   No    Memory:   Fair    Judgement:   Impaired    Insight:   Lacking    Psychomotor Activity:   Normal    Concentration:   Fair    Attention Span:   Fair   Recall:   Poor    Fund of Knowledge:   Limited    Language:   Good    Handed:   Right    Assets:   Leisure Time    ADL's:  Intact  Sleep:    Number of Hours: 7.25  AIMS: Facial and Oral Movements Muscles of Facial Expression: None, normal Lips and Perioral Area: None, normal Jaw: None, normal Tongue: None, normal,Extremity Movements Upper (arms, wrists, hands, fingers): None, normal Lower (legs, knees, ankles, toes): None, normal, Trunk Movements Neck, shoulders, hips: None, normal, Overall Severity Severity of abnormal movements (highest score from questions above): None, normal Incapacitation due to abnormal movements: None, normal Patient's awareness of abnormal movements (rate only patient's report): No Awareness, Dental Status Current problems with teeth and/or dentures?: No Does patient usually wear dentures?: No        Treatment Plan Summary: Daily contact with patient to assess and evaluate symptoms and progress in treatment and Medication management  Safety and Monitoring -- INVOLUNTARY admission to inpatient psychiatric unit for safety, stabilization and treatment -- Daily contact with patient to assess and evaluate symptoms and progress in treatment -- Patient's case to be discussed in multi-disciplinary team meeting -- Observation Level : q15 minute checks -- Vital signs:  q12 hours -- Precautions: suicide  Treatment Assessment & Plan Summary: Principal Problem:   Schizophrenia ([place]) Active Problems:   Hypertension   Type 2 diabetes mellitus ([place])   Schizophrenia by hx -ACTT referral per CSW, will be able to follow up and assist patient with monitoring required with  Clozaril. -Continued Haldol 10 mg twice daily - attempt to taper down on Haldol as he is up-titrated onto Clozaril dosing -Currently on Clozaril 50 mg qAM & 75 mg qPM  - checking CBC, troponin and CRP weekly with next lab  on 2/3; WBC 6.8 and ANC 3200 on 04/24/21 - QTC 411ms on 1/31 - A1c 9.2 and lipids WNL in 11/22 - Started senokot daily for Clozaril-induced constipation prevention -Considered LAI given lack of compliance and recurrent hospitalizations -Continued Lithium 450 mg BID for mood stability - Li level 0.89, repeat Li level 2/3 - Cr (!) 1.38 - trending closely while on Li and encouraging po hydration - TSH WNL - CMP repeat 2/3   T2DM with insulin dependence A1C of 9.2. Diabetic coordinator on-board for insulin management -Continued insulin per diabetic coordinator with increase in 70/30 to 40u with breakfast and continue 70/30 35u with supper -Continued SSI moderate -Continued HS coverage   HTN Continue home regimen -Lisinopril 20mg  daily (reduced due to concern for creatinine) -Amlodipine 10 mg daily -Clonidine 0.2 mg BID -Atenolol 12.5mg  daily   Elevated Cr, improved Admission Cr 1.4 on a baseline of 1-1.3. Cr (!) 1.38 on repeat. Suspect multifactorial, 2/2 poor PO intake prior to admission, T2DM, lisinopril, Lithium use. -Med consulted, stated that Cr is at baseline and no med changes required currently -Continue lisinopril 20mg  -Continue Fluid hydration encouragement - may have to come off Lithium if creatinine worsens - CMP repeat 2/3 for trending at next lab draw  Elevated transaminases, down trending - Med consulted, stated that no further work up necessary - Hepatitis panel nonreactive, ammonia 35, negative RUQ u/s 02/10/21  Hyponatremia, RESOLVED - likely secondary to elevated blood sugars    Signed: Merrily Brittle, DO Psychiatry Resident, PGY-1 [facility] 04/29/2021, 3:17 PM

## 2021-04-29 NOTE — BH IP Treatment Plan (Signed)
Interdisciplinary Treatment and Diagnostic Plan Update  04/29/2021 Time of Session: 10:10am  MUSAB WINGARD MRN: [id number]  Principal Diagnosis: Schizophrenia [facility])  Secondary Diagnoses: Principal Problem:   Schizophrenia (HCC) Active Problems:   Hypertension   Type 2 diabetes mellitus (HCC)   Current Medications:  Current Facility-Administered Medications  Medication Dose Route Frequency Provider Last Rate Last Admin   acetaminophen (TYLENOL) tablet 650 mg  650 mg Oral Q6H PRN Lenard Lance, FNP       alum & mag hydroxide-simeth (MAALOX/MYLANTA) 200-200-20 MG/5ML suspension 30 mL  30 mL Oral Q4H PRN Lenard Lance, FNP       amLODipine (NORVASC) tablet 10 mg  10 mg Oral QHS Princess Bruins, DO   10 mg at 04/28/21 2041   aspirin EC tablet 81 mg  81 mg Oral Daily Lenard Lance, FNP   81 mg at 04/29/21 0731   atenolol (TENORMIN) tablet 12.5 mg  12.5 mg Oral Daily Lenard Lance, FNP   12.5 mg at 04/29/21 0731   cloNIDine (CATAPRES) tablet 0.2 mg  0.2 mg Oral BID Lenard Lance, FNP   0.2 mg at 04/29/21 0732   cloZAPine (CLOZARIL) tablet 50 mg  50 mg Oral Daily Comer Locket, MD   50 mg at 04/29/21 7062   cloZAPine (CLOZARIL) tablet 75 mg  75 mg Oral QHS Mason Jim, Amy E, MD       haloperidol (HALDOL) tablet 10 mg  10 mg Oral BID Bartholomew Crews E, MD   10 mg at 04/29/21 3762   hydrOXYzine (ATARAX) tablet 25 mg  25 mg Oral TID PRN Lenard Lance, FNP   25 mg at 04/28/21 2041   insulin aspart (novoLOG) injection 0-15 Units  0-15 Units Subcutaneous TID WC Princess Bruins, DO   11 Units at 04/29/21 1211   insulin aspart (novoLOG) injection 0-5 Units  0-5 Units Subcutaneous QHS Princess Bruins, DO   4 Units at 04/28/21 2043   insulin aspart protamine- aspart (NOVOLOG MIX 70/30) injection 35 Units  35 Units Subcutaneous Q supper Princess Bruins, DO       [START ON 04/30/2021] insulin aspart protamine- aspart (NOVOLOG MIX 70/30) injection 40 Units  40 Units Subcutaneous Q breakfast Princess Bruins, DO        lisinopril (ZESTRIL) tablet 20 mg  20 mg Oral QHS Mason Jim, Amy E, MD   20 mg at 04/28/21 2043   lithium carbonate (ESKALITH) CR tablet 450 mg  450 mg Oral Q12H Lenard Lance, FNP   450 mg at 04/29/21 0731   OLANZapine zydis (ZYPREXA) disintegrating tablet 10 mg  10 mg Oral Q8H PRN Carlyn Reichert, MD       And   LORazepam (ATIVAN) tablet 1 mg  1 mg Oral PRN Carlyn Reichert, MD       And   ziprasidone (GEODON) injection 20 mg  20 mg Intramuscular PRN Carlyn Reichert, MD       magnesium hydroxide (MILK OF MAGNESIA) suspension 30 mL  30 mL Oral Daily PRN Lenard Lance, FNP       senna (SENOKOT) tablet 8.6 mg  1 tablet Oral Daily Princess Bruins, DO   8.6 mg at 04/29/21 1442   traZODone (DESYREL) tablet 50 mg  50 mg Oral QHS PRN Lenard Lance, FNP   50 mg at 04/28/21 2041   PTA Medications: Medications Prior to Admission  Medication Sig Dispense Refill Last Dose   amLODipine (NORVASC) 10 MG tablet Take 1 tablet (  10 mg total) by mouth daily. (Patient not taking: Reported on 04/16/2021) 90 tablet 1    aspirin 81 MG EC tablet Take 1 tablet (81 mg total) by mouth daily. Swallow whole. (Patient not taking: Reported on 04/16/2021) 30 tablet 12    atenolol (TENORMIN) 25 MG tablet Take 0.5 tablets (12.5 mg total) by mouth daily. (Patient not taking: Reported on 04/16/2021) 15 tablet 0    benztropine (COGENTIN) 1 MG tablet Take 1 tablet (1 mg total) by mouth 2 (two) times daily. 60 tablet 2    cloNIDine (CATAPRES) 0.2 MG tablet Take 0.2 mg by mouth 2 (two) times daily. (Patient not taking: Reported on 01/26/2021)      cloZAPine (CLOZARIL) 50 MG tablet Take 1 tablet (50 mg total) by mouth 2 (two) times daily. 60 tablet 0    glipiZIDE (GLUCOTROL XL) 10 MG 24 hr tablet Take 2 tablets (20 mg total) by mouth daily with breakfast. 60 tablet 0    haloperidol (HALDOL) 20 MG tablet Take 1 tablet (20 mg total) by mouth at bedtime. (Patient not taking: Reported on 01/26/2021) 90 tablet 1    insulin aspart (NOVOLOG) 100  UNIT/ML injection Inject 0-9 Units into the skin 3 (three) times daily with meals. 10 mL 11    insulin aspart protamine - aspart (NOVOLOG MIX 70/30 FLEXPEN) (70-30) 100 UNIT/ML FlexPen Inject 48 Units into the skin daily with breakfast. 15 mL 0    insulin aspart protamine- aspart (NOVOLOG MIX 70/30) (70-30) 100 UNIT/ML injection Inject 0.42 mLs (42 Units total) into the skin daily with supper. 10 mL 11    lisinopril (ZESTRIL) 40 MG tablet Take 1 tablet (40 mg total) by mouth daily. (Patient not taking: Reported on 01/11/2020) 90 tablet 1    lithium carbonate (ESKALITH) 450 MG CR tablet Take 1 tablet (450 mg total) by mouth every 12 (twelve) hours. (Patient not taking: Reported on 01/26/2021) 60 tablet 2    traZODone (DESYREL) 50 MG tablet Take 1 tablet (50 mg total) by mouth at bedtime as needed for sleep.       Patient Stressors: Marital or family conflict   Medication change or noncompliance    Patient Strengths: Automotive engineer for treatment/growth   Treatment Modalities: Medication Management, Group therapy, Case management,  1 to 1 session with clinician, Psychoeducation, Recreational therapy.   Physician Treatment Plan for Primary Diagnosis: Schizophrenia (HCC) Long Term Goal(s):     Short Term Goals:    Medication Management: Evaluate patient's response, side effects, and tolerance of medication regimen.  Therapeutic Interventions: 1 to 1 sessions, Unit Group sessions and Medication administration.  Evaluation of Outcomes: Progressing  Physician Treatment Plan for Secondary Diagnosis: Principal Problem:   Schizophrenia (HCC) Active Problems:   Hypertension   Type 2 diabetes mellitus (HCC)  Long Term Goal(s):     Short Term Goals:       Medication Management: Evaluate patient's response, side effects, and tolerance of medication regimen.  Therapeutic Interventions: 1 to 1 sessions, Unit Group sessions and Medication administration.  Evaluation of  Outcomes: Progressing   RN Treatment Plan for Primary Diagnosis: Schizophrenia (HCC) Long Term Goal(s): Knowledge of disease and therapeutic regimen to maintain health will improve  Short Term Goals: Ability to remain free from injury will improve, Ability to participate in decision making will improve, Ability to verbalize feelings will improve, Ability to disclose and discuss suicidal ideas, and Ability to identify and develop effective coping behaviors will improve  Medication  Management: RN will administer medications as ordered by provider, will assess and evaluate patient's response and provide education to patient for prescribed medication. RN will report any adverse and/or side effects to prescribing provider.  Therapeutic Interventions: 1 on 1 counseling sessions, Psychoeducation, Medication administration, Evaluate responses to treatment, Monitor vital signs and CBGs as ordered, Perform/monitor CIWA, COWS, AIMS and Fall Risk screenings as ordered, Perform wound care treatments as ordered.  Evaluation of Outcomes: Progressing   LCSW Treatment Plan for Primary Diagnosis: Schizophrenia (HCC) Long Term Goal(s): Safe transition to appropriate next level of care at discharge, Engage patient in therapeutic group addressing interpersonal concerns.  Short Term Goals: Engage patient in aftercare planning with referrals and resources, Increase social support, Increase emotional regulation, Facilitate acceptance of mental health diagnosis and concerns, Identify triggers associated with mental health/substance abuse issues, and Increase skills for wellness and recovery  Therapeutic Interventions: Assess for all discharge needs, 1 to 1 time with Social worker, Explore available resources and support systems, Assess for adequacy in community support network, Educate family and significant other(s) on suicide prevention, Complete Psychosocial Assessment, Interpersonal group therapy.  Evaluation of  Outcomes: Progressing   Progress in Treatment: Attending groups: Yes. Participating in groups: Yes. Taking medication as prescribed: Yes. Toleration medication: Yes. Family/Significant other contact made: No, will contact:  CSW will obtain consent Patient understands diagnosis: No. Discussing patient identified problems/goals with staff: Yes. Medical problems stabilized or resolved: Yes. Denies suicidal/homicidal ideation: Yes. Issues/concerns per patient self-inventory: No. Other: None   New problem(s) identified: No, Describe:  None   New Short Term/Long Term Goal(s):medication stabilization, elimination of SI thoughts, development of comprehensive mental wellness plan.    Patient Goals:  "to sit down and mind by business and think about my triggers and songs."   Discharge Plan or Barriers: Patient recently admitted. CSW will continue to follow and assess for appropriate referrals and possible discharge planning.    Reason for Continuation of Hospitalization: Delusions  Hallucinations Medication stabilization   Estimated Length of Stay: 3-5 days   Scribe for Treatment Team: Aram Beechamngel M Mark Hassey, Theresia MajorsLCSWA 04/29/2021 3:24 PM

## 2021-04-29 NOTE — [facility] Group Notes (Signed)
Spirituality group facilitated by Chaplain Katy Johney Perotti, B[facility].  ? ?Group Description: Group focused on topic of hope. Patients participated in facilitated discussion around topic, connecting with one another around experiences and definitions for hope. Group members engaged with visual explorer photos, reflecting on what hope looks like for them today. Group engaged in discussion around how their definitions of hope are present today in hospital.  ? ?Modalities: Psycho-social ed, Adlerian, Narrative, MI  ? ?Patient Progress: Patient did not attend. ? ? ?

## 2021-04-29 NOTE — Plan of Care (Signed)
°  Problem: Activity: Goal: Interest or engagement in activities will improve Outcome: Progressing   Problem: Coping: Goal: Ability to verbalize frustrations and anger appropriately will improve Outcome: Progressing   Problem: Coping: Goal: Ability to demonstrate self-control will improve Outcome: Progressing   Problem: Coping: Goal: Coping ability will improve Outcome: Progressing

## 2021-04-29 NOTE — Progress Notes (Signed)
Call for concern of AKI, elevated LFTs, and medication effects on both. With respect to SCr: review of the last year of available records show he has keep his range mostly in the 1 - 1.3 range. His creatinine today is 1.38. He is not in AKI. Pharmacy team can help renally dose medications if there is a concern about his SCr. With respect to his elevated LFTs: Hep panel is negative. LFTs are trending back down. Alk phos is very minimally elevated and bili is fine. At most, would add a RUQ [place] to this work up. If there is concern for hepatotoxicity of his current regimen; pharmacy team can help identify the medication and recommend modifications. This info has been relayed to Dr Alfonse Spruce.

## 2021-04-29 NOTE — Progress Notes (Signed)
°   04/29/21 2100  Psych Admission Type (Psych Patients Only)  Admission Status Involuntary  Psychosocial Assessment  Patient Complaints Suspiciousness  Eye Contact Fair  Facial Expression Flat  Affect Preoccupied  Speech Slurred  Interaction Assertive  Motor Activity Slow  Appearance/Hygiene Disheveled  Behavior Characteristics Cooperative  Mood Depressed;Preoccupied  Thought Chartered certified accountant of ideas  Content Magical thinking  Delusions Grandeur  Perception Hallucinations  Hallucination Auditory  Judgment Impaired  Confusion None  Danger to Self  Current suicidal ideation? Denies  Danger to Others  Danger to Others None reported or observed

## 2021-04-29 NOTE — Group Note (Signed)
LCSW Group Therapy Note ° ° °Group Date: 04/29/2021 °Start Time: 1300 °End Time: 1400 ° ° °Type of Therapy and Topic:  Group Therapy: Circle of Control ° °Participation Level:  Did not attend ° ° °Summary of Patient Progress:  Did not attend ° ° ° °Therapeutic Modalities:   °Cognitive Behavioral Therapy ° ° °Harry Shuck A Denaisha Swango, LCSW °04/29/2021  2:22 PM   °

## 2021-04-30 DIAGNOSIS — F250 Schizoaffective disorder, bipolar type: Secondary | ICD-10-CM | POA: Diagnosis not present

## 2021-04-30 LAB — GLUCOSE, CAPILLARY
Glucose-Capillary: 153 mg/dL — ABNORMAL HIGH (ref 70–99)
Glucose-Capillary: 330 mg/dL — ABNORMAL HIGH (ref 70–99)
Glucose-Capillary: 352 mg/dL — ABNORMAL HIGH (ref 70–99)

## 2021-04-30 MED ORDER — HALOPERIDOL 5 MG PO TABS
5.0000 mg | ORAL_TABLET | Freq: Every day | ORAL | Status: DC
  Administered 2021-05-01: 5 mg via ORAL
  Filled 2021-04-30 (×3): qty 1

## 2021-04-30 MED ORDER — HALOPERIDOL 5 MG PO TABS
10.0000 mg | ORAL_TABLET | Freq: Every day | ORAL | Status: DC
  Administered 2021-04-30: 10 mg via ORAL
  Filled 2021-04-30 (×3): qty 2

## 2021-04-30 NOTE — [facility] Counselor (Addendum)
CSW contacted [facility] to arrange intake appt for ACTT services but was unable to reach anyone. CSW left a HIPAA compliant message.  °  °Lurlene Ronda, LCSWA °Clinicial Social Worker °[facility]  °

## 2021-04-30 NOTE — Group Note (Signed)
Recreation Therapy Group Note   Group Topic:Personal Development  Group Date: 04/30/2021 Start Time: 1000 End Time: 1050 Facilitators: Caroll Rancher, LRT,CTRS Location: [address] Dayroom   Goal Area(s) Addresses:  Patient will use appropriate interactions in play with peers.   Patient will follow directions on first prompt.  Group Description:  Anxiety.  LRT and patients discussed what anxiety is and how it affects each person.  Patients were then given a worksheet to identify triggers, physical symptoms, thoughts and coping skills for anxiety.    Affect/Mood: Appropriate   Participation Level: Minimal   Participation Quality: Independent   Behavior: Hallucinating   Speech/Thought Process: Delusional   Insight: Impaired   Judgement: Impaired   Modes of Intervention: Music and Worksheet   Patient Response to Interventions:  None   Education Outcome:  Acknowledges education and In group clarification offered    Clinical Observations/Individualized Feedback: Pt was talking to himself and was his own entertainment.  Pt was appropriate,for him, in group session.  Pt was bright and to himself during group.    Plan: Continue to engage patient in RT group sessions 2-3x/week.   Caroll Rancher, LRT,CTRS 04/30/2021 12:09 PM

## 2021-04-30 NOTE — [facility] Group Notes (Signed)
Adult Psychoeducational Group Note  Date:  04/30/2021 Time:  10:36 PM  Group Topic/Focus:  Conflict Resolution:   The focus of this group is to discuss the conflict resolution process and how it may be used upon discharge.  Participation Level:  Minimal  Participation Quality:  Attentive  Affect:  Appropriate  Cognitive:  Alert  Insight: Good  Engagement in Group:  Engaged  Modes of Intervention:  Discussion  Additional Comments  Mitchell [name] 04/30/2021, 10:36 PM

## 2021-04-30 NOTE — Progress Notes (Signed)
Pt was encouraged but didn't attend orientation/goals group. ?

## 2021-04-30 NOTE — Progress Notes (Addendum)
[facility] MD Progress Note  04/30/2021 6:54 PM Mitchell [name]  MRN:  [id number]  Chief Complaint: delusions and SI  Subjective:   Mitchell [name] is a 61 y.o. male with PPHx of Schizophrenia and multiple hospitalizations, who presented to [facility] via IVC for delusion that he is [name] and threatening to jump into a trash compactor, then admitted Involuntary to [facility] for treatment of schizophrenia exacerbated by medication selective adherence.  Overnight: The patient's chart was reviewed and nursing notes were reviewed. Over the past 24 hrs, there were no documented behavioral issues, no PRN medications given for agitation, and the patient was compliant with scheduled medications. The patient's case was discussed in multidisciplinary team meeting.   On interview and assessment this today, patient was seen evaluated in patient room on 500 hall. He continued to exhibit poor insight to his hospitalization and has ongoing delusions and appeared more drowsy today.  Patient stated that he is "[name]" and that he is at a hospital.  He stated that "there was no reason why I was admitted." He reported that his sleep, appetite, and mood are "great". He denied SI/HI/AVH, first rank symptoms, or ideas of reference. He voiced no physical complaints and specifically denied issues with drooling, CP, urinary issues, dizziness, palpitations, SOB, or constipation while on Clozaril.   Principal Problem: Schizophrenia ([place]) Diagnosis: Principal Problem:   Schizophrenia ([place]) Active Problems:   Hypertension   Type 2 diabetes mellitus ([place])  Total Time Spent in Direct Patient Care: I personally spent 30 minutes on the unit in direct patient care. The direct patient care time included face-to-face time with the patient, reviewing the patient's chart, communicating with other professionals, and coordinating care. Greater than 50% of this time was spent in counseling or coordinating  care with the patient regarding goals of hospitalization, psycho-education, and discharge planning needs.   Past Psychiatric History: Past psych diagnoses: Schizophrenia Prior inpatient treatment: [place] ~14x (latest 01/28/2021), [facility] ~2020 Suicide attempts:  Psychiatric med trials: Zyprexa, loxapine, paliperidone injection/156 mg, Geodon Neuromodulation history: N/A Current outpatient psychiatrist:  Current outpatient therapist:  History of selective adherence: Yes.    Past Medical History:  Past Medical History:  Diagnosis Date   Bipolar affective disorder ([place])    takes Zyprexa daily   Coarse tremors 10/02/2014   Diabetes mellitus    takes Victoza,Metformin,and Glipizide daily   Hypertension    takes Amlodipine,Lisinopril and Clonidine daily   Hyponatremia    history of   Lithium toxicity 10/02/2014   Mental disorder    takes Lithium daily   Schizoaffective disorder    takes Trazodone nightly   Schizoaffective disorder ([place]) 01/04/2019   Seasonal allergies    takes Claritin daily   Sleep apnea    sleep study >75yrs ago   Stroke [facility])    left arm weakness    Past Surgical History:  Procedure Laterality Date   CATARACT EXTRACTION W/PHACO Right 02/14/2013   Procedure: CATARACT EXTRACTION PHACO AND INTRAOCULAR LENS PLACEMENT ([place]);  Surgeon: Adonis Brook, MD;  Location: [place];  Service: Ophthalmology;  Laterality: Right;   CATARACT EXTRACTION W/PHACO Left 06/13/2013   Procedure: CATARACT EXTRACTION PHACO AND INTRAOCULAR LENS PLACEMENT (IOC);  Surgeon: Adonis Brook, MD;  Location: [place];  Service: Ophthalmology;  Laterality: Left;   CIRCUMCISION  20 yrs. ago   [place] [facility]  Family Psychiatric  History: unable to obtain  Social History:  Social History   Substance and Sexual Activity  Alcohol Use Not Currently     Social History   Substance and Sexual Activity  Drug Use No    Social History   Socioeconomic History   Marital status:  Divorced    Spouse name: Not on file   Number of children: Not on file   Years of education: Not on file   Highest education level: Not on file  Occupational History   Not on file  Tobacco Use   Smoking status: Never   Smokeless tobacco: Never  Vaping Use   Vaping Use: Never used  Substance and Sexual Activity   Alcohol use: Not Currently   Drug use: No   Sexual activity: Yes    Birth control/protection: None  Other Topics Concern   Not on file  Social History Narrative   Not on file   Social Determinants of Health   Financial Resource Strain: Not on file  Food Insecurity: Not on file  Transportation Needs: Not on file  Physical Activity: Not on file  Stress: Not on file  Social Connections: Not on file   Sleep: Good  Appetite: Good  Current Medications: Current Facility-Administered Medications  Medication Dose Route Frequency Provider Last Rate Last Admin   acetaminophen (TYLENOL) tablet 650 mg  650 mg Oral Q6H PRN Lucky Rathke, FNP       alum & mag hydroxide-simeth (MAALOX/MYLANTA) 200-200-20 MG/5ML suspension 30 mL  30 mL Oral Q4H PRN Lucky Rathke, FNP       amLODipine (NORVASC) tablet 10 mg  10 mg Oral QHS Merrily Brittle, DO   10 mg at 04/29/21 2047   aspirin EC tablet 81 mg  81 mg Oral Daily Lucky Rathke, FNP   81 mg at 04/30/21 0816   atenolol (TENORMIN) tablet 12.5 mg  12.5 mg Oral Daily Lucky Rathke, FNP   12.5 mg at 04/30/21 [id number]   cloNIDine (CATAPRES) tablet 0.2 mg  0.2 mg Oral BID Lucky Rathke, FNP   0.2 mg at 04/30/21 1726   cloZAPine (CLOZARIL) tablet 50 mg  50 mg Oral Daily Viann Fish E, MD   50 mg at 04/30/21 0816   cloZAPine (CLOZARIL) tablet 75 mg  75 mg Oral QHS Harlow Asa, MD   75 mg at 04/29/21 2047   haloperidol (HALDOL) tablet 10 mg  10 mg Oral BID Harlow Asa, MD   10 mg at 04/30/21 1726   hydrOXYzine (ATARAX) tablet 25 mg  25 mg Oral TID PRN Lucky Rathke, FNP   25 mg at 04/29/21 2047   insulin aspart (novoLOG) injection 0-15  Units  0-15 Units Subcutaneous TID WC Merrily Brittle, DO   15 Units at 04/30/21 1729   insulin aspart (novoLOG) injection 0-5 Units  0-5 Units Subcutaneous QHS Merrily Brittle, DO   3 Units at 04/29/21 2049   insulin aspart protamine- aspart (NOVOLOG MIX 70/30) injection 35 Units  35 Units Subcutaneous Q supper Merrily Brittle, DO   35 Units at 04/30/21 1727   insulin aspart protamine- aspart (NOVOLOG MIX 70/30) injection 40 Units  40 Units Subcutaneous Q breakfast Merrily Brittle, DO   40 Units at 04/30/21 0816   lisinopril (ZESTRIL) tablet 20 mg  20 mg Oral QHS Nelda Marseille, Ayoub Arey E, MD   20 mg at 04/29/21 2047   lithium carbonate (ESKALITH) CR tablet 450 mg  450 mg Oral Q12H Lucky Rathke, FNP   450 mg at 04/30/21 [phone]  OLANZapine zydis (ZYPREXA) disintegrating tablet 10 mg  10 mg Oral Q8H PRN Corky Sox, MD       And   LORazepam (ATIVAN) tablet 1 mg  1 mg Oral PRN Corky Sox, MD       And   ziprasidone (GEODON) injection 20 mg  20 mg Intramuscular PRN Corky Sox, MD       magnesium hydroxide (MILK OF MAGNESIA) suspension 30 mL  30 mL Oral Daily PRN Lucky Rathke, FNP       senna (SENOKOT) tablet 8.6 mg  1 tablet Oral Daily Merrily Brittle, DO   8.6 mg at 04/30/21 [id number]   traZODone (DESYREL) tablet 50 mg  50 mg Oral QHS PRN Lucky Rathke, FNP   50 mg at 04/29/21 2047    Lab Results:  Results for orders placed or performed during the hospital encounter of 04/23/21 (from the past 48 hour(s))  Comprehensive metabolic panel     Status: Abnormal   Collection Time: 04/28/21  7:08 PM  Result Value Ref Range   Sodium 135 135 - 145 mmol/L   Potassium 4.2 3.5 - 5.1 mmol/L   Chloride 101 98 - 111 mmol/L   CO2 26 22 - 32 mmol/L   Glucose, Bld 380 (H) 70 - 99 mg/dL    Comment: Glucose reference range applies only to samples taken after fasting for at least 8 hours.   BUN 21 (H) 6 - 20 mg/dL   Creatinine, Ser 1.38 (H) 0.61 - 1.24 mg/dL   Calcium 9.1 8.9 - 10.3 mg/dL   Total Protein 7.4 6.5 - 8.1  g/dL   Albumin 3.8 3.5 - 5.0 g/dL   AST 66 (H) 15 - 41 U/L   ALT 96 (H) 0 - 44 U/L   Alkaline Phosphatase 136 (H) 38 - 126 U/L   Total Bilirubin 0.1 (L) 0.3 - 1.2 mg/dL   GFR, Estimated 59 (L) >60 mL/min    Comment: (NOTE) Calculated using the CKD-EPI Creatinine Equation (2021)    Anion gap 8 5 - 15    Comment: Performed at [facility], [place] [address] [place], [place] [address]  TSH     Status: None   Collection Time: 04/28/21  7:08 PM  Result Value Ref Range   TSH 2.233 0.350 - 4.500 uIU/mL    Comment: Performed by a 3rd Generation assay with a functional sensitivity of <=0.01 uIU/mL. Performed at [facility], [place] [address] [place], [place] [address]   Glucose, capillary     Status: Abnormal   Collection Time: 04/28/21  7:40 PM  Result Value Ref Range   Glucose-Capillary 314 (H) 70 - 99 mg/dL    Comment: Glucose reference range applies only to samples taken after fasting for at least 8 hours.  Glucose, capillary     Status: Abnormal   Collection Time: 04/29/21  5:29 AM  Result Value Ref Range   Glucose-Capillary 164 (H) 70 - 99 mg/dL    Comment: Glucose reference range applies only to samples taken after fasting for at least 8 hours.  Glucose, capillary     Status: Abnormal   Collection Time: 04/29/21 11:56 AM  Result Value Ref Range   Glucose-Capillary 335 (H) 70 - 99 mg/dL    Comment: Glucose reference range applies only to samples taken after fasting for at least 8 hours.  Glucose, capillary     Status: Abnormal   Collection Time: 04/29/21  5:16 PM  Result Value Ref  Range   Glucose-Capillary 280 (H) 70 - 99 mg/dL    Comment: Glucose reference range applies only to samples taken after fasting for at least 8 hours.  Glucose, capillary     Status: Abnormal   Collection Time: 04/29/21  7:40 PM  Result Value Ref Range   Glucose-Capillary 287 (H) 70 - 99 mg/dL    Comment: Glucose reference range applies only to samples taken after  fasting for at least 8 hours.  Glucose, capillary     Status: Abnormal   Collection Time: 04/30/21  5:53 AM  Result Value Ref Range   Glucose-Capillary 153 (H) 70 - 99 mg/dL    Comment: Glucose reference range applies only to samples taken after fasting for at least 8 hours.  Glucose, capillary     Status: Abnormal   Collection Time: 04/30/21 12:03 PM  Result Value Ref Range   Glucose-Capillary 330 (H) 70 - 99 mg/dL    Comment: Glucose reference range applies only to samples taken after fasting for at least 8 hours.  Glucose, capillary     Status: Abnormal   Collection Time: 04/30/21  4:59 PM  Result Value Ref Range   Glucose-Capillary 352 (H) 70 - 99 mg/dL    Comment: Glucose reference range applies only to samples taken after fasting for at least 8 hours.    Blood Alcohol level:  Lab Results  Component Value Date   ETH <10 04/16/2021   ETH <10 [id number]    Metabolic Disorder Labs: Lab Results  Component Value Date   HGBA1C 9.2 (H) 04/24/2021   MPG 217.34 04/24/2021   MPG 271.87 01/26/2021   No results found for: PROLACTIN Lab Results  Component Value Date   CHOL 140 01/30/2021   TRIG 95 01/30/2021   HDL 49 01/30/2021   CHOLHDL 2.9 01/30/2021   VLDL 19 01/30/2021   LDLCALC 72 01/30/2021   LDLCALC 67 05/20/2020   Psychiatric Specialty Exam: Physical Exam Vitals and nursing note reviewed.  Constitutional:      Comments: Drowsy this a.m. after taking medications  HENT:     Head: Normocephalic.  Pulmonary:     Effort: No respiratory distress.  Neurological:     General: No focal deficit present.     Mental Status: He is alert.     Gait: Gait normal.  Psychiatric:        Behavior: Behavior is cooperative.    Review of Systems  HENT:  Negative for drooling (When falling asleep during interview. Minimal).   Respiratory:  Negative for shortness of breath.   Cardiovascular:  Negative for chest pain and palpitations.  Gastrointestinal:  Negative for  constipation.  Genitourinary:  Negative for difficulty urinating.  Musculoskeletal:  Negative for myalgias.  Neurological:  Negative for dizziness, tremors, seizures, speech difficulty, weakness, light-headedness and headaches.   Body mass index is 37.42 kg/m. Temp:  [97.8 F (36.6 C)-97.9 F (36.6 C)] 97.8 F (36.6 C) (02/02 1620) Pulse Rate:  [90-99] 99 (02/02 1620) Resp:  [18] 18 (02/02 1620) BP: (130-139)/(77-82) 139/82 (02/02 1620) SpO2:  [96 %-99 %] 99 % (02/02 1620)  General Appearance:  Appropriate for Environment; Casual; Fairly Groomed    Eye Contact:   Fair    Speech:   Clear and Coherent; Normal Rate (Spontaneous)    Volume:   Normal    Mood:   Described as "great" - less irritable today    Affect:   Constricted- Overly drowsy, appeared to be falling asleep during interview with  house officer but not with attending    Thought Process:   Concrete    Duration of Psychotic Symptoms:  Greater than six months  Past Diagnosis of Schizophrenia or Psychoactive disorder:  Yes   Orientation:   A&Ox2 -building, city, county Stated March, 2026.  Was unable to state that it was [place]. Declined to answer further questions    Thought Content:  Illogical - Has delusions that he is [name]; denies AVH, ideas of reference, or first rank symptoms; is not grossly responding to internal/external stimuli on exam  Ideas of reference: None    Suicidal Thoughts:   No    Homicidal Thoughts:   No    Memory:   Fair    Judgement:   Impaired    Insight:   Lacking    Psychomotor Activity:   Normal    Concentration:   Fair    Attention Span:   Fair   Recall:   Poor    Fund of Knowledge:   Limited    Language:   Good    Handed:   Right    Assets:   Leisure Time    ADL's:  Intact  Sleep:    Number of Hours: 7.75  AIMS: Facial and Oral Movements Muscles of Facial Expression: None, normal Lips and Perioral Area: None, normal Jaw:  None, normal Tongue: None, normal,Extremity Movements Upper (arms, wrists, hands, fingers): None, normal Lower (legs, knees, ankles, toes): None, normal, Trunk Movements Neck, shoulders, hips: None, normal, Overall Severity Severity of abnormal movements (highest score from questions above): None, normal Incapacitation due to abnormal movements: None, normal Patient's awareness of abnormal movements (rate only patient's report): No Awareness, Dental Status Current problems with teeth and/or dentures?: No Does patient usually wear dentures?: No        Treatment Plan Summary: Daily contact with patient to assess and evaluate symptoms and progress in treatment and Medication management  Safety and Monitoring -- INVOLUNTARY admission to inpatient psychiatric unit for safety, stabilization and treatment -- Daily contact with patient to assess and evaluate symptoms and progress in treatment -- Patient's case to be discussed in multi-disciplinary team meeting -- Observation Level : q15 minute checks -- Vital signs:  q12 hours -- Precautions: suicide  Treatment Assessment & Plan Summary: Principal Problem:   Schizophrenia ([place]) Active Problems:   Hypertension   Type 2 diabetes mellitus ([place])   Schizophrenia by hx -ACTT referral per CSW, will be able to follow up and assist patient with monitoring required with Clozaril. -Changed Haldol 10 mg twice daily to 5 mg qAM & 15 mg qPM starting 2/3, due to concerns for some daytime sedation -Currently on Clozaril 50 mg qAM & 75 mg qPM (first dose of 75 mg 2/1) - checking CBC, troponin and CRP weekly with next lab on 2/3; WBC 6.8 and ANC 3200 on 04/24/21 - QTC 453ms on 1/31 - A1c 9.2 and lipids WNL in 11/22 - Started senokot daily for Clozaril-induced constipation prevention -Continued Lithium 450 mg BID for mood stability - Li level 0.89, repeat Li level 2/3 - Cr (!) 1.38 - trending closely while on Li and encouraging po hydration - TSH  WNL - CMP repeat 2/3   T2DM with insulin dependence A1C of 9.2. Diabetic coordinator on-board for insulin management -Continued insulin per diabetic coordinator with increase in 70/30 to 40u with breakfast and continue 70/30 35u with supper -Continued SSI moderate -Continued HS coverage   HTN Continue home regimen -Lisinopril 20mg   daily (reduced due to concern for creatinine) -Amlodipine 10 mg daily -Clonidine 0.2 mg BID -Atenolol 12.5mg  daily   Elevated Cr, improved Admission Cr 1.4 on a baseline of 1-1.3. Cr (!) 1.38 on repeat. Suspect multifactorial, 2/2 poor PO intake prior to admission, T2DM, lisinopril, Lithium use. -Med consulted, stated that Cr is at baseline and no med changes required currently -Continue lisinopril 20mg  -Continue Fluid hydration encouragement - may have to come off Lithium if creatinine worsens -CMP repeat 2/3 for trending at next lab draw  Elevated transaminases, down trending - Med consulted, stated that no further work up necessary - Hepatitis panel nonreactive, ammonia 35, negative RUQ u/s 02/10/21   Signed: Merrily Brittle, DO  Psychiatry Resident, PGY-1 [facility] 04/30/2021, 6:54 PM

## 2021-04-30 NOTE — [facility] Group Notes (Signed)
[facility] Group Notes:  (Nursing/MHT/Case Management/Adjunct)  Date:  04/30/2021  Time:  4:35 PM  Type of Therapy:   Therapeutic Relaxation group  Participation Level:  Active  Participation Quality:  Attentive  Affect:  Appropriate  Cognitive:  Appropriate  Insight:  Appropriate  Engagement in Group:  Improving and Lacking  Modes of Intervention:  Activity, Exploration, and Support  Summary of Progress/Problems: Group consisted of listening to relaxing nature sounds and deep breathing exercises along with positive affirmations/imagery. This combination has been known to help relieve stress in the mind and body.  Lawren Sexson J Juwaun Inskeep 04/30/2021, 4:35 PM

## 2021-04-30 NOTE — Progress Notes (Signed)
°   04/30/21 2200  Psych Admission Type (Psych Patients Only)  Admission Status Involuntary  Psychosocial Assessment  Patient Complaints Anxiety;Suspiciousness  Eye Contact Fair  Facial Expression Flat  Affect Preoccupied  Speech Slurred  Interaction Assertive  Motor Activity Slow  Appearance/Hygiene Disheveled  Behavior Characteristics Cooperative  Mood Suspicious;Preoccupied  Thought Chartered certified accountant of ideas  Content Magical thinking  Delusions Grandeur  Perception Hallucinations  Hallucination Auditory  Judgment Impaired  Confusion None  Danger to Self  Current suicidal ideation? Denies  Danger to Others  Danger to Others None reported or observed

## 2021-04-30 NOTE — B[facility] Counselor (Signed)
CSW spoke with Delorise Shiner from [employer] CST teams to inquire if pt could return to his home. She stated she would contact his place of residence and contact CSW with an answer on Monday as she does not work Advertising account executive.   Fredirick Lathe, LCSWA Clinicial Social Worker [employer]

## 2021-04-30 NOTE — Progress Notes (Signed)
°   04/30/21 0545  Sleep  Number of Hours 7.75

## 2021-04-30 NOTE — Plan of Care (Signed)
°  Problem: Activity: Goal: Interest or engagement in activities will improve Outcome: Progressing   Problem: Activity: Goal: Sleeping patterns will improve Outcome: Progressing   Problem: Coping: Goal: Ability to verbalize frustrations and anger appropriately will improve Outcome: Progressing   Problem: Health Behavior/Discharge Planning: Goal: Compliance with treatment plan for underlying cause of condition will improve Outcome: Progressing

## 2021-05-01 DIAGNOSIS — F250 Schizoaffective disorder, bipolar type: Secondary | ICD-10-CM | POA: Diagnosis not present

## 2021-05-01 LAB — COMPREHENSIVE METABOLIC PANEL
ALT: 136 U/L — ABNORMAL HIGH (ref 0–44)
AST: 82 U/L — ABNORMAL HIGH (ref 15–41)
Albumin: 3.9 g/dL (ref 3.5–5.0)
Alkaline Phosphatase: 139 U/L — ABNORMAL HIGH (ref 38–126)
Anion gap: 7 (ref 5–15)
BUN: 19 mg/dL (ref 6–20)
CO2: 28 mmol/L (ref 22–32)
Calcium: 9.3 mg/dL (ref 8.9–10.3)
Chloride: 99 mmol/L (ref 98–111)
Creatinine, Ser: 1.09 mg/dL (ref 0.61–1.24)
GFR, Estimated: >60 mL/min (ref >60)
Glucose, Bld: 218 mg/dL — ABNORMAL HIGH (ref 70–99)
Potassium: 4.1 mmol/L (ref 3.5–5.1)
Sodium: 134 mmol/L — ABNORMAL LOW (ref 135–145)
Total Bilirubin: 0.5 mg/dL (ref 0.3–1.2)
Total Protein: 7.5 g/dL (ref 6.5–8.1)

## 2021-05-01 LAB — CBC WITH DIFFERENTIAL/PLATELET
Abs Immature Granulocytes: 0.05 10*3/uL (ref 0.00–0.07)
Basophils Absolute: 0 10*3/uL (ref 0.0–0.1)
Basophils Relative: 1 %
Eosinophils Absolute: 0.2 10*3/uL (ref 0.0–0.5)
Eosinophils Relative: 4 %
HCT: 37.5 % — ABNORMAL LOW (ref 39.0–52.0)
Hemoglobin: 12.5 g/dL — ABNORMAL LOW (ref 13.0–17.0)
Immature Granulocytes: 1 %
Lymphocytes Relative: 35 %
Lymphs Abs: 2.1 10*3/uL (ref 0.7–4.0)
MCH: 31 pg (ref 26.0–34.0)
MCHC: 33.3 g/dL (ref 30.0–36.0)
MCV: 93.1 fL (ref 80.0–100.0)
Monocytes Absolute: 0.4 10*3/uL (ref 0.1–1.0)
Monocytes Relative: 7 %
Neutro Abs: 3.2 10*3/uL (ref 1.7–7.7)
Neutrophils Relative %: 52 %
Platelets: 227 10*3/uL (ref 150–400)
RBC: 4.03 MIL/uL — ABNORMAL LOW (ref 4.22–5.81)
RDW: 13.2 % (ref 11.5–15.5)
WBC: 6 10*3/uL (ref 4.0–10.5)
nRBC: 0 % (ref 0.0–0.2)

## 2021-05-01 LAB — C-REACTIVE PROTEIN: CRP: 1.2 mg/dL — ABNORMAL HIGH (ref <1.0)

## 2021-05-01 LAB — GLUCOSE, CAPILLARY
Glucose-Capillary: 319 mg/dL — ABNORMAL HIGH (ref 70–99)
Glucose-Capillary: 177 mg/dL — ABNORMAL HIGH (ref 70–99)
Glucose-Capillary: 386 mg/dL — ABNORMAL HIGH (ref 70–99)
Glucose-Capillary: 328 mg/dL — ABNORMAL HIGH (ref 70–99)
Glucose-Capillary: 340 mg/dL — ABNORMAL HIGH (ref 70–99)

## 2021-05-01 LAB — LITHIUM LEVEL: Lithium Lvl: 0.67 mmol/L (ref 0.60–1.20)

## 2021-05-01 LAB — TROPONIN I (HIGH SENSITIVITY): Troponin I (High Sensitivity): 3 ng/L (ref <18)

## 2021-05-01 MED ORDER — HALOPERIDOL 5 MG PO TABS
15.0000 mg | ORAL_TABLET | Freq: Every day | ORAL | Status: DC
  Administered 2021-05-01 – 2021-05-26 (×26): 15 mg via ORAL
  Filled 2021-05-01 (×28): qty 3

## 2021-05-01 MED ORDER — HALOPERIDOL 5 MG PO TABS
5.0000 mg | ORAL_TABLET | Freq: Every day | ORAL | Status: DC
  Administered 2021-05-02 – 2021-05-27 (×26): 5 mg via ORAL
  Filled 2021-05-01 (×30): qty 1

## 2021-05-01 MED ORDER — CLOZAPINE 100 MG PO TABS
100.0000 mg | ORAL_TABLET | Freq: Every day | ORAL | Status: DC
  Administered 2021-05-01 – 2021-05-07 (×7): 100 mg via ORAL
  Filled 2021-05-01 (×8): qty 1

## 2021-05-01 MED ORDER — INSULIN ASPART PROT & ASPART (70-30 MIX) 100 UNIT/ML ~~LOC~~ SUSP
48.0000 [IU] | Freq: Every day | SUBCUTANEOUS | Status: DC
  Administered 2021-05-02: 48 [IU] via SUBCUTANEOUS

## 2021-05-01 NOTE — Progress Notes (Signed)
Inpatient Diabetes Program Recommendations  AACE/ADA: New Consensus Statement on Inpatient Glycemic Control (2015)  Target Ranges:  Prepandial:   less than 140 mg/dL      Peak postprandial:   less than 180 mg/dL (1-2 hours)      Critically ill patients:  140 - 180 mg/dL   Lab Results  Component Value Date   GLUCAP 177 (H) 05/01/2021   HGBA1C 9.2 (H) 04/24/2021    Review of Glycemic Control  Latest Reference Range & Units 04/30/21 05:53 04/30/21 12:03 04/30/21 16:59 04/30/21 19:36 05/01/21 05:36  Glucose-Capillary 70 - 99 mg/dL 037 (H) 048 (H) 889 (H) 319 (H) 177 (H)  (H): Data is abnormally high Diabetes history: DM2 Outpatient Diabetes medications: 70/30 48 units with BF and 42 with supper,  Glipizide 20 qd Current orders for Inpatient glycemic control: 70/30  40 am, 35 pm Novolog 0-9 units TID   Inpatient Diabetes Program Recommendations:   Please consider: -Increase am 70/30 dose to 48 units Secure chat sent to Dr. Loleta Chance.  Thank you, Billy Fischer. Marquies Wanat, RN, MSN, CDE  Diabetes Coordinator Inpatient Glycemic Control Team Team Pager [phone] (8am-5pm) 05/01/2021 10:55 AM

## 2021-05-01 NOTE — Group Note (Signed)
[facility] LCSW Group Therapy Note  Date/Time: 05/01/2021 at 11am   Type of Therapy and Topic:  Group Therapy:  Who Am I?  Self Esteem, Self-Actualization and Understanding Self.  Participation Level:  Minimal   Description of Group:    In this group patients will be asked to explore values, beliefs, truths, and morals as they relate to personal self.  Patients will be guided to discuss their thoughts, feelings, and behaviors related to what they identify as important to their true self. Patients will process together how values, beliefs and truths are connected to specific choices patients make every day. Each patient will be challenged to identify changes that they are motivated to make in order to improve self-esteem and self-actualization. This group will be process-oriented, with patients participating in exploration of their own experiences as well as giving and receiving support and challenge from other group members.  Therapeutic Goals: Patient will identify false beliefs that currently interfere with their self-esteem.  Patient will identify feelings, thought process, and behaviors related to self and will become aware of the uniqueness of themselves and of others.  Patient will be able to identify and verbalize values, morals, and beliefs as they relate to self. Patient will begin to learn how to build self-esteem/self-awareness by expressing what is important and unique to them personally.  Summary of Patient Progress: Patient participated in introductions and then left group.  He identified himself as [name] and told the group that his mother named him.              Therapeutic Modalities:   Cognitive Behavioral Therapy Solution Focused Therapy Motivational Interviewing Brief Therapy   Arlynn Mcdermid, LCSW, LCAS Clincal Social Worker  [facility]

## 2021-05-01 NOTE — Progress Notes (Addendum)
[facility] MD Progress Note  05/01/2021 5:50 PM [name]  MRN:  [id number]  Chief Complaint: delusions and SI  Subjective:   Mitchell [name] is a 61 y.o. male with PPHx of Schizophrenia and multiple hospitalizations, who presented to [facility] via IVC for delusion that he is Mitchell [name] and Mitchell [name] and threatening to jump into a trash compactor, then admitted Involuntary to [facility] for treatment of schizophrenia exacerbated by medication selective adherence.  Overnight: The patient's chart was reviewed and nursing notes were reviewed. Over the past 24 hrs, there were no documented behavioral issues, no PRN medications given for agitation, and the patient was compliant with scheduled medications. He did receive an additional Haldol dose of 32m yesterday evening during transition of medications over to planned med change of Haldol 521mqam and 1569mhs. The patient's case was discussed in multidisciplinary team meeting.   On interview and assessment today, patient was evaluated in interview space on 500 hall.  He appeared drowsy despite being midmorning, although more alert than yesterday. Patient adamantly denied being sleepy, as his eyes are closing. He reported abruptly "I am feeling GREAT and there is nothing wrong with me".  He responded "GREAT" to sleep, appetite, mood, and replied "why do you keep asking me these dumb questions". "I am Mitchell Schaumannhis is my mansion, and that bald guy [pointing towards double doors] out there is my father."  He reported that he has had 99 34ars old, born in 04/23/2041, and that it is February 2023. In response to how that is possible, patient stated "I gotcha" and declined to answer any more orientation questions.Patient stated that his last bowel movement was earlier today. He voiced no physical complaints and specifically denied issues with drooling, CP, urinary issues, dizziness, palpitations, SOB, or constipation while on Clozaril. He denied  SI/HI/AVH, first rank symptoms, or ideas of reference. Per staff, he has been seen talking to himself in response to internal stimuli.  Principal Problem: Schizophrenia (HCCSmithtoniagnosis: Principal Problem:   Schizophrenia (HCCOnalaskactive Problems:   Hypertension   Type 2 diabetes mellitus (HCCHartfordTotal Time Spent in Direct Patient Care: I personally spent 30 minutes on the unit in direct patient care. The direct patient care time included face-to-face time with the patient, reviewing the patient's chart, communicating with other professionals, and coordinating care. Greater than 50% of this time was spent in counseling or coordinating care with the patient regarding goals of hospitalization, psycho-education, and discharge planning needs.   Past Psychiatric History: Past psych diagnoses: Schizophrenia Prior inpatient treatment: [id number] ([facility] Suicide attempts:  Psychiatric med trials: Zyprexa, loxapine, paliperidone injection/156 mg, Geodon Neuromodulation history: N/A Current outpatient psychiatrist:  Current outpatient therapist:  History of selective adherence: Yes.    Past Medical History:  Past Medical History:  Diagnosis Date   Bipolar affective disorder (HCCGreenwood  takes Zyprexa daily   Coarse tremors 10/02/2014   Diabetes mellitus    takes Victoza,Metformin,and Glipizide daily   Hypertension    takes Amlodipine,Lisinopril and Clonidine daily   Hyponatremia    history of   Lithium toxicity 10/02/2014   Mental disorder    takes Lithium daily   Schizoaffective disorder    takes Trazodone nightly   Schizoaffective disorder (HCCVenice0/10/2018   Seasonal allergies    takes Claritin daily   Sleep apnea    sleep study >[id number]   [facility]    Past Surgical History:  Procedure Laterality Date   CATARACT EXTRACTION W/PHACO Right 02/14/2013   Procedure: CATARACT EXTRACTION PHACO AND INTRAOCULAR LENS PLACEMENT (IOC);  Surgeon:  Adonis Brook, MD;  Location: [place];  Service: Ophthalmology;  Laterality: Right;   CATARACT EXTRACTION W/PHACO Left 06/13/2013   Procedure: CATARACT EXTRACTION PHACO AND INTRAOCULAR LENS PLACEMENT (IOC);  Surgeon: Adonis Brook, MD;  Location: [place];  Service: Ophthalmology;  Laterality: Left;   CIRCUMCISION  20 yrs. ago   EYE SURGERY     Family History:see H&P  Family Psychiatric  History: unable to obtain  Social History:  Social History   Substance and Sexual Activity  Alcohol Use Not Currently     Social History   Substance and Sexual Activity  Drug Use No    Social History   Socioeconomic History   Marital status: Divorced    Spouse name: Not on file   Number of children: Not on file   Years of education: Not on file   Highest education level: Not on file  Occupational History   Not on file  Tobacco Use   Smoking status: Never   Smokeless tobacco: Never  Vaping Use   Vaping Use: Never used  Substance and Sexual Activity   Alcohol use: Not Currently   Drug use: No   Sexual activity: Yes    Birth control/protection: None  Other Topics Concern   Not on file  Social History Narrative   Not on file   Social Determinants of Health   Financial Resource Strain: Not on file  Food Insecurity: Not on file  Transportation Needs: Not on file  Physical Activity: Not on file  Stress: Not on file  Social Connections: Not on file   Sleep: Good  Appetite: Good  Current Medications: Current Facility-Administered Medications  Medication Dose Route Frequency Provider Last Rate Last Admin   acetaminophen (TYLENOL) tablet 650 mg  650 mg Oral Q6H PRN Lucky Rathke, FNP       alum & mag hydroxide-simeth (MAALOX/MYLANTA) 200-200-20 MG/5ML suspension 30 mL  30 mL Oral Q4H PRN Lucky Rathke, FNP       amLODipine (NORVASC) tablet 10 mg  10 mg Oral QHS Merrily Brittle, DO   10 mg at 04/30/21 2058   aspirin EC tablet 81 mg  81 mg Oral Daily Lucky Rathke, FNP   81 mg at 05/01/21  1048   atenolol (TENORMIN) tablet 12.5 mg  12.5 mg Oral Daily Lucky Rathke, FNP   12.5 mg at 05/01/21 1049   cloNIDine (CATAPRES) tablet 0.2 mg  0.2 mg Oral BID Lucky Rathke, FNP   0.2 mg at 05/01/21 1712   cloZAPine (CLOZARIL) tablet 100 mg  100 mg Oral QHS Jannel Lynne E, MD       cloZAPine (CLOZARIL) tablet 50 mg  50 mg Oral Daily Nelda Marseille, Kihanna Kamiya E, MD   50 mg at 05/01/21 1048   haloperidol (HALDOL) tablet 10 mg  10 mg Oral QHS Merrily Brittle, DO   10 mg at 04/30/21 2058   haloperidol (HALDOL) tablet 5 mg  5 mg Oral Q0600 Merrily Brittle, DO   5 mg at 05/01/21 0604   insulin aspart (novoLOG) injection 0-15 Units  0-15 Units Subcutaneous TID WC Merrily Brittle, DO   11 Units at 05/01/21 1714   insulin aspart (novoLOG) injection 0-5 Units  0-5 Units Subcutaneous QHS Merrily Brittle, DO   4 Units at 04/30/21 2104   insulin  aspart protamine- aspart (NOVOLOG MIX 70/30) injection 35 Units  35 Units Subcutaneous Q supper Merrily Brittle, DO   35 Units at 05/01/21 1713   [START ON 05/02/2021] insulin aspart protamine- aspart (NOVOLOG MIX 70/30) injection 48 Units  48 Units Subcutaneous Q breakfast Merrily Brittle, DO       lisinopril (ZESTRIL) tablet 20 mg  20 mg Oral QHS Nelda Marseille, Yee Joss E, MD   20 mg at 04/30/21 2057   lithium carbonate (ESKALITH) CR tablet 450 mg  450 mg Oral Q12H Lucky Rathke, FNP   450 mg at 05/01/21 1048   OLANZapine zydis (ZYPREXA) disintegrating tablet 10 mg  10 mg Oral Q8H PRN Corky Sox, MD       And   LORazepam (ATIVAN) tablet 1 mg  1 mg Oral PRN Corky Sox, MD       And   ziprasidone (GEODON) injection 20 mg  20 mg Intramuscular PRN Corky Sox, MD       magnesium hydroxide (MILK OF MAGNESIA) suspension 30 mL  30 mL Oral Daily PRN Lucky Rathke, FNP       senna (SENOKOT) tablet 8.6 mg  1 tablet Oral Daily Merrily Brittle, DO   8.6 mg at 05/01/21 1048   traZODone (DESYREL) tablet 50 mg  50 mg Oral QHS PRN Lucky Rathke, FNP   50 mg at 04/30/21 2058    Lab Results:   Results for orders placed or performed during the hospital encounter of 04/23/21 (from the past 48 hour(s))  Glucose, capillary     Status: Abnormal   Collection Time: 04/29/21  7:40 PM  Result Value Ref Range   Glucose-Capillary 287 (H) 70 - 99 mg/dL    Comment: Glucose reference range applies only to samples taken after fasting for at least 8 hours.  Glucose, capillary     Status: Abnormal   Collection Time: 04/30/21  5:53 AM  Result Value Ref Range   Glucose-Capillary 153 (H) 70 - 99 mg/dL    Comment: Glucose reference range applies only to samples taken after fasting for at least 8 hours.  Glucose, capillary     Status: Abnormal   Collection Time: 04/30/21 12:03 PM  Result Value Ref Range   Glucose-Capillary 330 (H) 70 - 99 mg/dL    Comment: Glucose reference range applies only to samples taken after fasting for at least 8 hours.  Glucose, capillary     Status: Abnormal   Collection Time: 04/30/21  4:59 PM  Result Value Ref Range   Glucose-Capillary 352 (H) 70 - 99 mg/dL    Comment: Glucose reference range applies only to samples taken after fasting for at least 8 hours.  Glucose, capillary     Status: Abnormal   Collection Time: 04/30/21  7:36 PM  Result Value Ref Range   Glucose-Capillary 319 (H) 70 - 99 mg/dL    Comment: Glucose reference range applies only to samples taken after fasting for at least 8 hours.  Glucose, capillary     Status: Abnormal   Collection Time: 05/01/21  5:36 AM  Result Value Ref Range   Glucose-Capillary 177 (H) 70 - 99 mg/dL    Comment: Glucose reference range applies only to samples taken after fasting for at least 8 hours.  CBC with Differential/Platelet     Status: Abnormal   Collection Time: 05/01/21  6:31 AM  Result Value Ref Range   WBC 6.0 4.0 - 10.5 K/uL   RBC 4.03 (L) 4.22 - 5.81 MIL/uL  Hemoglobin 12.5 (L) 13.0 - 17.0 g/dL   HCT 37.5 (L) 39.0 - 52.0 %   MCV 93.1 80.0 - 100.0 fL   MCH 31.0 26.0 - 34.0 pg   MCHC 33.3 30.0 - 36.0 g/dL    RDW 13.2 11.5 - 15.5 %   Platelets 227 150 - 400 K/uL   nRBC 0.0 0.0 - 0.2 %   Neutrophils Relative % 52 %   Neutro Abs 3.2 1.7 - 7.7 K/uL   Lymphocytes Relative 35 %   Lymphs Abs 2.1 0.7 - 4.0 K/uL   Monocytes Relative 7 %   Monocytes Absolute 0.4 0.1 - 1.0 K/uL   Eosinophils Relative 4 %   Eosinophils Absolute 0.2 0.0 - 0.5 K/uL   Basophils Relative 1 %   Basophils Absolute 0.0 0.0 - 0.1 K/uL   Immature Granulocytes 1 %   Abs Immature Granulocytes 0.05 0.00 - 0.07 K/uL    Comment: Performed at [facility], [place] [address] [place], [place] [address]  C-reactive protein     Status: Abnormal   Collection Time: 05/01/21  6:31 AM  Result Value Ref Range   CRP 1.2 (H) <1.0 mg/dL    Comment: Performed at [place] [address] [place], [place] [address]  Lithium level     Status: None   Collection Time: 05/01/21  6:31 AM  Result Value Ref Range   Lithium Lvl 0.67 0.60 - 1.20 mmol/L    Comment: Performed at [facility], [place] [address], [place], [place] [address]  Comprehensive metabolic panel     Status: Abnormal   Collection Time: 05/01/21  6:31 AM  Result Value Ref Range   Sodium 134 (L) 135 - 145 mmol/L   Potassium 4.1 3.5 - 5.1 mmol/L   Chloride 99 98 - 111 mmol/L   CO2 28 22 - 32 mmol/L   Glucose, Bld 218 (H) 70 - 99 mg/dL    Comment: Glucose reference range applies only to samples taken after fasting for at least 8 hours.   BUN 19 6 - 20 mg/dL   Creatinine, Ser 1.09 0.61 - 1.24 mg/dL   Calcium 9.3 8.9 - 10.3 mg/dL   Total Protein 7.5 6.5 - 8.1 g/dL   Albumin 3.9 3.5 - 5.0 g/dL   AST 82 (H) 15 - 41 U/L   ALT 136 (H) 0 - 44 U/L   Alkaline Phosphatase 139 (H) 38 - 126 U/L   Total Bilirubin 0.5 0.3 - 1.2 mg/dL   GFR, Estimated >60 >60 mL/min    Comment: (NOTE) Calculated using the CKD-EPI Creatinine Equation (2021)    Anion gap 7 5 - 15    Comment: Performed at [facility], [place] [address]  [place], [place] [address]  Troponin I (High Sensitivity)     Status: None   Collection Time: 05/01/21  6:31 AM  Result Value Ref Range   Troponin I (High Sensitivity) 3 <18 ng/L    Comment: (NOTE) Elevated high sensitivity troponin I (hsTnI) values and significant  changes across serial measurements may suggest ACS but many other  chronic and acute conditions are known to elevate hsTnI results.  Refer to the "Links" section for chest pain algorithms and additional  guidance. Performed at [facility], [place] [address], [place], [place] [address]   Glucose, capillary     Status: Abnormal   Collection Time: 05/01/21 11:51 AM  Result Value Ref Range   Glucose-Capillary 386 (  H) 70 - 99 mg/dL    Comment: Glucose reference range applies only to samples taken after fasting for at least 8 hours.  Glucose, capillary     Status: Abnormal   Collection Time: 05/01/21  5:02 PM  Result Value Ref Range   Glucose-Capillary 328 (H) 70 - 99 mg/dL    Comment: Glucose reference range applies only to samples taken after fasting for at least 8 hours.    Blood Alcohol level:  Lab Results  Component Value Date   ETH <10 04/16/2021   ETH <10 36/64/4034    Metabolic Disorder Labs: Lab Results  Component Value Date   HGBA1C 9.2 (H) 04/24/2021   MPG 217.34 04/24/2021   MPG 271.87 01/26/2021   No results found for: PROLACTIN Lab Results  Component Value Date   CHOL 140 01/30/2021   TRIG 95 01/30/2021   HDL 49 01/30/2021   CHOLHDL 2.9 01/30/2021   VLDL 19 01/30/2021   LDLCALC 72 01/30/2021   LDLCALC 67 05/20/2020   Psychiatric Specialty Exam: Physical Exam Vitals and nursing note reviewed.  Constitutional:      Comments: Drowsy this a.m. after taking medications  HENT:     Head: Normocephalic.  Pulmonary:     Effort: No respiratory distress.  Neurological:     General: No focal deficit present.     Mental Status: He is alert.     Gait: Gait normal.  Psychiatric:         Behavior: Behavior is cooperative.    Review of Systems  HENT:  Negative for drooling (When falling asleep during interview. Minimal).   Respiratory:  Negative for shortness of breath.   Cardiovascular:  Negative for chest pain and palpitations.  Gastrointestinal:  Negative for constipation.  Genitourinary:  Negative for difficulty urinating.  Musculoskeletal:  Negative for myalgias.  Neurological:  Negative for dizziness, tremors, seizures, speech difficulty, weakness, light-headedness and headaches.   Body mass index is 37.42 kg/m. Temp:  [98.7 F (37.1 C)] 98.7 F (37.1 C) (02/03 0547) Pulse Rate:  [84-96] 86 (02/03 1616) BP: (122-133)/(64-82) 127/79 (02/03 1616) SpO2:  [99 %-100 %] 100 % (02/03 0549)  General Appearance:  Appropriate for Environment; Casual (Wearing the same shirt for >2 days) Drowsy  Eye Contact:   Fair    Speech:   Clear and Coherent; Normal Rate (Spontaneous)    Volume:   Normal    Mood:   Described as "great" - less irritable today    Affect:   Constricted- Overly drowsy, appeared to be falling asleep during interview with house officer but not with attending    Thought Process:   Concrete, linear   Duration of Psychotic Symptoms:  Greater than six months  Past Diagnosis of Schizophrenia or Psychoactive disorder:  Yes   Orientation:   A&Ox2 -building, city, county Stated March, 2026.  Was unable to state that it was [place]. Declined to answer further questions    Thought Content:  Illogical - Has delusions that he is [name]; denies AVH, ideas of reference, or first rank symptoms; is not grossly responding to internal/external stimuli on exam but per staff has been seen responding to internal stimuli on the unit; paranoid with questioning  Ideas of reference: None    Suicidal Thoughts:   No    Homicidal Thoughts:   No    Memory:   Fair    Judgement:   Impaired    Insight:   Lacking    Psychomotor Activity:  No  cogwheeling, no stiffness, no tremor, AIMS 0    Concentration:   Fair    Attention Span:   Fair   Recall:   Poor    Fund of Knowledge:   Limited    Language:   Good    Handed:   Right    Assets:   Leisure Time    ADL's:  Intact  Sleep:    Number of Hours: 7  AIMS: Facial and Oral Movements Muscles of Facial Expression: None, normal Lips and Perioral Area: None, normal Jaw: None, normal Tongue: None, normal,Extremity Movements Upper (arms, wrists, hands, fingers): None, normal Lower (legs, knees, ankles, toes): None, normal, Trunk Movements Neck, shoulders, hips: None, normal, Overall Severity Severity of abnormal movements (highest score from questions above): None, normal Incapacitation due to abnormal movements: None, normal Patient's awareness of abnormal movements (rate only patient's report): No Awareness, Dental Status Current problems with teeth and/or dentures?: No Does patient usually wear dentures?: No        Treatment Plan Summary: Daily contact with patient to assess and evaluate symptoms and progress in treatment and Medication management  Safety and Monitoring -- Involuntary admission to inpatient psychiatric unit for safety, stabilization and treatment -- Daily contact with patient to assess and evaluate symptoms and progress in treatment -- Patient's case to be discussed in multi-disciplinary team meeting -- Observation Level : q15 minute checks -- Vital signs:  q12 hours -- Precautions: suicide  Treatment Assessment & Plan Summary: Principal Problem:   Schizophrenia ([place]) Active Problems:   Hypertension   Type 2 diabetes mellitus ([place])   Schizophrenia by hx -ACTT referral per CSW, will be able to follow up and assist patient with monitoring required with Clozaril. -Transitioned to Haldol 5 mg qAM & 15 mg qPM due daytime sedation - goal to titrate down on Haldol over time as Clozaril is increased -Increase Clozaril 50 mg qAM & 100 mg qPM  (first dose of 100 mg 2/3) -  WBC 6.0 and ANC 3200 on 2/3, Troponin 3 on 05/01/21, CRP 1.2 on 05/01/21 - QTC 448m on 1/31 - A1c 9.2 and lipids WNL in 11/22 - Continued senokot daily for Clozaril-induced constipation prevention -Continued Lithium 450 mg BID for mood stability - Li level 0.67 2/3 - Cr 1.09 - trending closely while on Li and encouraging po hydration - TSH WNL   T2DM with insulin dependence A1C of 9.2. Diabetic coordinator on-board for insulin management -Continued insulin per diabetic coordinator with increase in 70/30 to 48u with breakfast and continue 70/30 35u with supper -Continued SSI moderate -Continued HS coverage   HTN Continue home regimen -Lisinopril 272mdaily (reduced due to concern for creatinine) -Amlodipine 10 mg daily -Clonidine 0.2 mg BID -Atenolol 12.50m29maily   Elevated Cr, resolved Admission Cr 1.4 on a baseline of 1-1.3. Cr 1.09 on repeat on 05/01/21.  -Med consulted, stated that Cr is at baseline and no med changes required currently -Continue lisinopril 68m79montinue Fluid hydration encouragement   Mild Hyponatremia - Repeat Na+ 134 on 05/01/21 - improving as blood sugars are better controlled  Elevated transaminases, stable Repeat 05/01/21: AST 82 and ALT 136; Alk phos 139 - Med consulted, stated that no further work up necessary - Hepatitis panel nonreactive, ammonia 35, negative RUQ u/s 02/10/21 - Will need outpatient f/u after discharge  Signed: JuliMerrily Brittle  Psychiatry Resident, PGY-1 [facility]/2023, 5:50 PM

## 2021-05-01 NOTE — Progress Notes (Signed)
°   05/01/21 0530  °Sleep  °Number of Hours 7  ° ° °

## 2021-05-01 NOTE — Progress Notes (Signed)
Adult Psychoeducational Group Note  Date:  05/01/2021 Time:  8:49 PM  Group Topic/Focus:  Wrap-Up Group:   The focus of this group is to help patients review their daily goal of treatment and discuss progress on daily workbooks.  Participation Level:  Active  Participation Quality:  Appropriate  Affect:  Appropriate  Cognitive:  Appropriate  Insight: Appropriate  Engagement in Group:  Engaged  Modes of Intervention:  Discussion  Additional Comments:   Pt stated his goal for today was to focus on his treatment plan. Pt stated he accomplished his goal today. Pt stated he talked with his doctor and with his social worker about his care today. Pt rated his overall day a 10. Pt stated he was able to contact his aunt today which improved his overall day. Pt stated he felt better about himself today. Pt stated he was able to attend all meals. Pt stated he took all medications provided today. Pt stated he attend all groups held today. Pt stated his appetite was pretty good today. Pt rated sleep last night was pretty good. Pt stated the goal tonight was to get some rest. Pt stated he had no physical pain tonight. Pt deny visual hallucinations and auditory issues tonight. Pt denies thoughts of harming himself or others. Pt stated he would alert staff if anything changed  Candy Sledge 05/01/2021, 8:49 PM

## 2021-05-01 NOTE — [facility] Counselor (Signed)
Events affecting the discharge plan:  -ACTT intake Interventions by CSW: -CSW attempted to schedule ACTT intake but was unable to reach [facility]    Fredirick Lathe, [employer] Clinicial Social Worker [employer]

## 2021-05-01 NOTE — [facility] Counselor (Signed)
CSW contacted [facility] to arrange intake appt for ACTT services but was unable to reach anyone. CSW left a HIPAA compliant message.    Toney Reil, [place] Worker [employer]

## 2021-05-01 NOTE — Group Note (Signed)
Recreation Therapy Group Note   Group Topic:Team Building  Group Date: 05/01/2021 Start Time: 1000 End Time: 1030 Facilitators: Caroll Rancher, LRT,CTRS Location: 500 Hall Dayroom   Goal Area(s) Addresses:  Patient will effectively work with peer towards shared goal.  Patient will identify skills used to make activity successful.  Patient will share challenges and verbalize solution-driven approaches used. Patient will identify how skills used during activity can be used to reach post d/c goals.    Group Description:  [employer]. Patients were provided the following materials: 5 drinking straws, 5 rubber bands, 5 paper clips, 2 index cards and 2 drinking cups. Using the provided materials patients were asked to build a launching mechanism to launch a ping pong ball across the room, approximately 10 feet. Patients were divided into teams of 3-5. Instructions required all materials be incorporated into the device, functionality of items left to the peer group's discretion.   Affect/Mood: Flat   Participation Level: None   Participation Quality: Independent   Behavior: Observant   Speech/Thought Process: Disorganized   Insight: Impaired   Judgement: Impaired   Modes of Intervention: Group work and Music   Patient Response to Interventions:  Disengaged   Education Outcome:  Acknowledges education and In group clarification offered    Clinical Observations/Individualized Feedback: Pt sat and observed peers for most of the time he was in group.  As pt left, he did a little dancing before leaving and not returning.    Plan: Continue to engage patient in RT group sessions 2-3x/week.   Caroll Rancher, LRT,CTRS 05/01/2021 12:17 PM

## 2021-05-02 DIAGNOSIS — F250 Schizoaffective disorder, bipolar type: Secondary | ICD-10-CM | POA: Diagnosis not present

## 2021-05-02 LAB — GLUCOSE, CAPILLARY
Glucose-Capillary: 177 mg/dL — ABNORMAL HIGH (ref 70–99)
Glucose-Capillary: 289 mg/dL — ABNORMAL HIGH (ref 70–99)
Glucose-Capillary: 337 mg/dL — ABNORMAL HIGH (ref 70–99)
Glucose-Capillary: 435 mg/dL — ABNORMAL HIGH (ref 70–99)
Glucose-Capillary: 259 mg/dL — ABNORMAL HIGH (ref 70–99)

## 2021-05-02 MED ORDER — INSULIN ASPART PROT & ASPART (70-30 MIX) 100 UNIT/ML ~~LOC~~ SUSP
52.0000 [IU] | Freq: Every day | SUBCUTANEOUS | Status: DC
  Administered 2021-05-03 – 2021-05-24 (×22): 52 [IU] via SUBCUTANEOUS

## 2021-05-02 NOTE — Progress Notes (Addendum)
[facility] MD Progress Note  05/02/2021 10:50 AM Mitchell [name]  MRN:  [id number]  Chief Complaint: delusions and SI  Subjective:   Mitchell [name] is a 61 y.o. male with PPHx of Schizophrenia and multiple hospitalizations, who presented to [name] via [facility]C for delusion that he is Mitchell [name] and [name] and threatening to jump into a trash compactor, then admitted Involuntary to [name] for treatment of schizophrenia exacerbated by medication selective adherence.  Overnight: The patient's chart was reviewed and nursing notes were reviewed. Over the past 24 hrs, there were no documented behavioral issues, no PRN medications given for agitation, and the patient was compliant with scheduled medications. The patient's case was discussed in multidisciplinary team meeting.   On interview and assessment midmorning today, patient was evaluated in patient room on 500 hall where he was initially seen sitting on the bench with his eyes closed.  Patient was awake and drowsy and for the first time patient acknowledges that he feels sleepy, unlike prior days where he adamantly denied low energy.  In response to what he was doing sitting on the bench with his eyes closed, he reported "praying". He reported that his sleep was "great", with no issues falling asleep, waking up another night, or waking up too early. Patient continued to insist that he is "Mitchell [name]" and that this is his mansion that he calls "the [name]".  Patient stated his age is around 45, however he was not sure and declined to answer his date of birth.  He continued to be disoriented, stating that it is the year 44 and declined to answer any more orientation questions. While nodding off during interview, patient drooled but when asked about it, he adamantly denied drooling.  Reported he only drools when he is sleeping. He reported that he had a bowel movement this morning, denied constipation and diarrhea.  He denied chest pain,  dizziness, palpitations, SOB, abdominal pain. He reported that his appetite is good and denied any physical complaints. He denied SI/HI/AVH, first rank symptoms, or ideas of reference and did not appear internally/externally preoccupied during exam.  Principal Problem: Schizophrenia ([facility]chell [name]) Diagnosis: Principal Problem:   Schizophrenia (Mitchell [name]) Active Problems:   Hypertension   Type 2 diabetes mellitus (Mitchell [name])  Total Time Spent in Direct Patient Care: I personally spent 30 minutes on the unit in direct patient care. The direct patient care time included face-to-face time with the patient, reviewing the patient's chart, communicating with other professionals, and coordinating care. Greater than 50% of this time was spent in counseling or coordinating care with the patient regarding goals of hospitalization, psycho-education, and discharge planning needs.   Past Psychiatric History: Past psych diagnoses: Schizophrenia Prior inpatient treatment: [name] ~14x (latest 01/28/2021), [name] ~2020 Suicide attempts:  Psychiatric med trials: Zyprexa, loxapine, paliperidone injection/156 mg, Geodon Neuromodulation history: N/A Current outpatient psychiatrist:  Current outpatient therapist:  History of selective adherence: Yes.    Past Medical History:  Past Medical History:  Diagnosis Date   Bipolar affective disorder ([name])    takes Zyprexa daily   Coarse tremors 10/02/2014   Diabetes mellitus    takes Victoza,Metformin,and Glipizide daily   Hypertension    takes Amlodipine,Lisinopril and Clonidine daily   Hyponatremia    history of   Lithium toxicity 10/02/2014   Mental disorder    takes Lithium daily   Schizoaffective disorder    takes Trazodone nightly   Schizoaffective disorder ([name]) 01/04/2019   Seasonal allergies  takes Claritin daily   Sleep apnea    sleep study >6yr ago   Stroke ([name]    left arm weakness    Past Surgical History:  Procedure Laterality Date   CATARACT EXTRACTION  W/PHACO Right 02/14/2013   Procedure: CATARACT EXTRACTION PHACO AND INTRAOCULAR LENS PLACEMENT (IOC);  Surgeon: GAdonis Brook MD;  Location[facility]  Service: Ophthalmology;  Laterality: Right;   CATARACT EXTRACTION W/PHACO Left 06/13/2013   Procedure: CATARACT EXTRACTION PHACO AND INTRAOCULAR LENS PLACEMENT (IOC);  Surgeon: GAdonis Brook MD;  Location: [facility]  Service: Ophthalmology;  Laterality: Left;   CIRCUMCISION  20 yrs. ago   EYE SURGERY     Family History:see H&P  Family Psychiatric  History: unable to obtain  Social History:  Social History   Substance and Sexual Activity  Alcohol Use Not Currently     Social History   Substance and Sexual Activity  Drug Use No    Social History   Socioeconomic History   Marital status: Divorced    Spouse name: Not on file   Number of children: Not on file   Years of education: Not on file   Highest education level: Not on file  Occupational History   Not on file  Tobacco Use   Smoking status: Never   Smokeless tobacco: Never  Vaping Use   Vaping Use: Never used  Substance and Sexual Activity   Alcohol use: Not Currently   Drug use: No   Sexual activity: Yes    Birth control/protection: None  Other Topics Concern   Not on file  Social History Narrative   Not on file   Social Determinants of Health   Financial Resource Strain: Not on file  Food Insecurity: Not on file  Transportation Needs: Not on file  Physical Activity: Not on file  Stress: Not on file  Social Connections: Not on file   Sleep: Good  Appetite: Good  Current Medications: Current Facility-Administered Medications  Medication Dose Route Frequency Provider Last Rate Last Admin   acetaminophen (TYLENOL) tablet 650 mg  650 mg Oral Q6H PRN ALucky Rathke FNP       alum & mag hydroxide-simeth (MAALOX/MYLANTA) 200-200-20 MG/5ML suspension 30 mL  30 mL Oral Q4H PRN ALucky Rathke FNP       amLODipine (NORVASC) tablet 10 mg  10 mg Oral QHS NMerrily Brittle  DO   10 mg at 05/01/21 2043   aspirin EC tablet 81 mg  81 mg Oral Daily ALucky Rathke FNP   81 mg at 05/02/21 0749   atenolol (TENORMIN) tablet 12.5 mg  12.5 mg Oral Daily ALucky Rathke FNP   12.5 mg at 05/02/21 0749   cloNIDine (CATAPRES) tablet 0.2 mg  0.2 mg Oral BID ALucky Rathke FNP   0.2 mg at 05/02/21 0749   cloZAPine (CLOZARIL) tablet 100 mg  100 mg Oral QHS SNelda Marseille Olen Eaves E, MD   100 mg at 05/01/21 2043   cloZAPine (CLOZARIL) tablet 50 mg  50 mg Oral Daily SNelda Marseille Savas Elvin E, MD   50 mg at 05/02/21 0750   haloperidol (HALDOL) tablet 15 mg  15 mg Oral QHS SViann FishE, MD   15 mg at 05/01/21 2043   haloperidol (HALDOL) tablet 5 mg  5 mg Oral Daily SNelda Marseille Dekayla Prestridge E, MD   5 mg at 05/02/21 0751   insulin aspart (novoLOG) injection 0-15 Units  0-15 Units Subcutaneous TID WC NMerrily Brittle DO   3 Units at  05/02/21 0642   insulin aspart (novoLOG) injection 0-5 Units  0-5 Units Subcutaneous QHS Merrily Brittle, DO   4 Units at 05/01/21 2044   insulin aspart protamine- aspart (NOVOLOG MIX 70/30) injection 35 Units  35 Units Subcutaneous Q supper Merrily Brittle, DO   35 Units at 05/01/21 1713   insulin aspart protamine- aspart (NOVOLOG MIX 70/30) injection 48 Units  48 Units Subcutaneous Q breakfast Merrily Brittle, DO   48 Units at 05/02/21 0753   lisinopril (ZESTRIL) tablet 20 mg  20 mg Oral QHS Nelda Marseille, Giacomo Valone E, MD   20 mg at 05/01/21 2043   lithium carbonate (ESKALITH) CR tablet 450 mg  450 mg Oral Q12H Lucky Rathke, FNP   450 mg at 05/02/21 0750   OLANZapine zydis (ZYPREXA) disintegrating tablet 10 mg  10 mg Oral Q8H PRN Corky Sox, MD       And   LORazepam (ATIVAN) tablet 1 mg  1 mg Oral PRN Corky Sox, MD       And   ziprasidone (GEODON) injection 20 mg  20 mg Intramuscular PRN Corky Sox, MD       magnesium hydroxide (MILK OF MAGNESIA) suspension 30 mL  30 mL Oral Daily PRN Lucky Rathke, FNP       senna (SENOKOT) tablet 8.6 mg  1 tablet Oral Daily Merrily Brittle, DO   8.6  mg at 05/02/21 0750   traZODone (DESYREL) tablet 50 mg  50 mg Oral QHS PRN Lucky Rathke, FNP   50 mg at 05/01/21 2042    Lab Results:  Results for orders placed or performed during the [name] encounter of 04/23/21 (from the past 48 hour(s))  Glucose, capillary     Status: Abnormal   Collection Time: 04/30/21 12:03 PM  Result Value Ref Range   Glucose-Capillary 330 (H) 70 - 99 mg/dL    Comment: Glucose reference range applies only to samples taken after fasting for at least 8 hours.  Glucose, capillary     Status: Abnormal   Collection Time: 04/30/21  4:59 PM  Result Value Ref Range   Glucose-Capillary 352 (H) 70 - 99 mg/dL    Comment: Glucose reference range applies only to samples taken after fasting for at least 8 hours.  Glucose, capillary     Status: Abnormal   Collection Time: 04/30/21  7:36 PM  Result Value Ref Range   Glucose-Capillary 319 (H) 70 - 99 mg/dL    Comment: Glucose reference range applies only to samples taken after fasting for at least 8 hours.  Glucose, capillary     Status: Abnormal   Collection Time: 05/01/21  5:36 AM  Result Value Ref Range   Glucose-Capillary 177 (H) 70 - 99 mg/dL    Comment: Glucose reference range applies only to samples taken after fasting for at least 8 hours.  CBC with Differential/Platelet     Status: Abnormal   Collection Time: 05/01/21  6:31 AM  Result Value Ref Range   WBC 6.0 4.0 - 10.5 K/uL   RBC 4.03 (L) 4.22 - 5.81 MIL/uL   Hemoglobin 12.5 (L) 13.0 - 17.0 g/dL   HCT 37.5 (L) 39.0 - 52.0 %   MCV 93.1 80.0 - 100.0 fL   MCH 31.0 26.0 - 34.0 pg   MCHC 33.3 30.0 - 36.0 g/dL   RDW 13.2 11.5 - 15.5 %   Platelets 227 150 - 400 K/uL   nRBC 0.0 0.0 - 0.2 %   Neutrophils Relative % 52 %  Neutro Abs 3.2 1.7 - 7.7 K/uL   Lymphocytes Relative 35 %   Lymphs Abs 2.1 0.7 - 4.0 K/uL   Monocytes Relative 7 %   Monocytes Absolute 0.4 0.1 - 1.0 K/uL   Eosinophils Relative 4 %   Eosinophils Absolute 0.2 0.0 - 0.5 K/uL   Basophils  Relative 1 %   Basophils Absolute 0.0 0.0 - 0.1 K/uL   Immature Granulocytes 1 %   Abs Immature Granulocytes 0.05 0.00 - 0.07 K/uL    Comment: Performed at [facility], [place] [address], [place], [place] [address]  C-reactive protein     Status: Abnormal   Collection Time: 05/01/21  6:31 AM  Result Value Ref Range   CRP 1.2 (H) <1.0 mg/dL    Comment: Performed at [facility] [address] [place], [place] [address]  Lithium level     Status: None   Collection Time: 05/01/21  6:31 AM  Result Value Ref Range   Lithium Lvl 0.67 0.60 - 1.20 mmol/L    Comment: Performed at [name], [place] [address] [place], [place] [address]  Comprehensive metabolic panel     Status: Abnormal   Collection Time: 05/01/21  6:31 AM  Result Value Ref Range   Sodium 134 (L) 135 - 145 mmol/L   Potassium 4.1 3.5 - 5.1 mmol/L   Chloride 99 98 - 111 mmol/L   CO2 28 22 - 32 mmol/L   Glucose, Bld 218 (H) 70 - 99 mg/dL    Comment: Glucose reference range applies only to samples taken after fasting for at least 8 hours.   BUN 19 6 - 20 mg/dL   Creatinine, Ser 1.09 0.61 - 1.24 mg/dL   Calcium 9.3 8.9 - 10.3 mg/dL   Total Protein 7.5 6.5 - 8.1 g/dL   Albumin 3.9 3.5 - 5.0 g/dL   AST 82 (H) 15 - 41 U/L   ALT 136 (H) 0 - 44 U/L   Alkaline Phosphatase 139 (H) 38 - 126 U/L   Total Bilirubin 0.5 0.3 - 1.2 mg/dL   GFR, Estimated >60 >60 mL/min    Comment: (NOTE) Calculated using the CKD-EPI Creatinine Equation (2021)    Anion gap 7 5 - 15    Comment: Performed at [name], [place] [address] [place], [place] [address]  Troponin I (High Sensitivity)     Status: None   Collection Time: 05/01/21  6:31 AM  Result Value Ref Range   Troponin I (High Sensitivity) 3 <18 ng/L    Comment: (NOTE) Elevated high sensitivity troponin I (hsTnI) values and significant  changes across serial measurements may suggest ACS but many other  chronic and acute  conditions are known to elevate hsTnI results.  Refer to the "Links" section for chest pain algorithms and additional  guidance. Performed at [name], [place] [address] [place], [place] [address]   Glucose, capillary     Status: Abnormal   Collection Time: 05/01/21 11:51 AM  Result Value Ref Range   Glucose-Capillary 386 (H) 70 - 99 mg/dL    Comment: Glucose reference range applies only to samples taken after fasting for at least 8 hours.  Glucose, capillary     Status: Abnormal   Collection Time: 05/01/21  5:02 PM  Result Value Ref Range   Glucose-Capillary 328 (H) 70 - 99 mg/dL    Comment: Glucose reference range applies only to samples taken after fasting for at least 8  hours.  Glucose, capillary     Status: Abnormal   Collection Time: 05/01/21  7:33 PM  Result Value Ref Range   Glucose-Capillary 340 (H) 70 - 99 mg/dL    Comment: Glucose reference range applies only to samples taken after fasting for at least 8 hours.  Glucose, capillary     Status: Abnormal   Collection Time: 05/02/21  5:47 AM  Result Value Ref Range   Glucose-Capillary 177 (H) 70 - 99 mg/dL    Comment: Glucose reference range applies only to samples taken after fasting for at least 8 hours.    Blood Alcohol level:  Lab Results  Component Value Date   ETH <10 04/16/2021   ETH <10 98/33/8250    Metabolic Disorder Labs: Lab Results  Component Value Date   HGBA1C 9.2 (H) 04/24/2021   MPG 217.34 04/24/2021   MPG 271.87 01/26/2021   No results found for: PROLACTIN Lab Results  Component Value Date   CHOL 140 01/30/2021   TRIG 95 01/30/2021   HDL 49 01/30/2021   CHOLHDL 2.9 01/30/2021   VLDL 19 01/30/2021   LDLCALC 72 01/30/2021   LDLCALC 67 05/20/2020   Psychiatric Specialty Exam: Physical Exam Vitals and nursing note reviewed.  Constitutional:      Comments: Drowsy this a.m. after taking medications  HENT:     Head: Normocephalic.  Pulmonary:     Effort: No  respiratory distress.  Neurological:     General: No focal deficit present.     Mental Status: He is alert.     Gait: Gait normal.  Psychiatric:        Behavior: Behavior is cooperative.    Review of Systems  HENT:  Positive for drooling (When falling asleep during interview. Minimal).   Respiratory:  Negative for shortness of breath.   Cardiovascular:  Negative for chest pain and palpitations.  Gastrointestinal:  Negative for constipation.  Genitourinary:  Negative for difficulty urinating.  Musculoskeletal:  Negative for myalgias.  Neurological:  Negative for dizziness, tremors, seizures, speech difficulty, weakness, light-headedness and headaches.   Body mass index is 37.42 kg/m. Temp:  [98.4 F (36.9 C)] 98.4 F (36.9 C) (02/04 0539) Pulse Rate:  [86-99] 98 (02/04 0541) BP: (127-155)/(79-84) 154/84 (02/04 0541) SpO2:  [98 %-99 %] 99 % (02/04 0539)  General Appearance:  Oddly dressed in long skirt and top Drowsy  Eye Contact:   Fair    Speech:   Mumbling quality at times but coherent    Volume:   Normal    Mood:   Described as "great" - less irritable but remains aloof    Affect:   Constricted- Overly drowsy, appeared to be falling asleep during interview with house officer but not with attending    Thought Process:   Concrete, linear   Duration of Psychotic Symptoms:  Greater than six months  Past Diagnosis of Schizophrenia or Psychoactive disorder:  Yes   Orientation:    Reported that is the year 68, that he is in a mansion, that he is Mitchell [name], declined to answer further questions about city, state, month, situation    Thought Content:  Illogical - 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; denies AVH, ideas of reference, or first rank symptoms; is not grossly responding to internal/external stimuli on exam; less paranoid with questioning  Ideas of reference: None    Suicidal Thoughts:   No    Homicidal Thoughts:   No    Memory:   Limited  Judgement:   Impaired    Insight:   Lacking    Psychomotor Activity:   No cogwheeling, no stiffness, no tremor, AIMS 0    Concentration:   Fair    Attention Span:   Fair   Recall:   Poor    Fund of Knowledge:   Limited    Language:   Good    Handed:   Right    Assets:   Leisure Time    ADL's:  Intact  Sleep:    Number of Hours: 5.25  AIMS: Facial and Oral Movements Muscles of Facial Expression: None, normal Lips and Perioral Area: None, normal Jaw: None, normal Tongue: None, normal,Extremity Movements Upper (arms, wrists, hands, fingers): None, normal Lower (legs, knees, ankles, toes): None, normal, Trunk Movements Neck, shoulders, hips: None, normal, Overall Severity Severity of abnormal movements (highest score from questions above): None, normal Incapacitation due to abnormal movements: None, normal Patient's awareness of abnormal movements (rate only patient's report): No Awareness, Dental Status Current problems with teeth and/or dentures?: No Does patient usually wear dentures?: No        Treatment Plan Summary: Daily contact with patient to assess and evaluate symptoms and progress in treatment and Medication management  Safety and Monitoring -- Involuntary admission to inpatient psychiatric unit for safety, stabilization and treatment -- Daily contact with patient to assess and evaluate symptoms and progress in treatment -- Patient's case to be discussed in multi-disciplinary team meeting -- Observation Level : q15 minute checks -- Vital signs:  q12 hours -- Precautions: suicide  Treatment Assessment & Plan Summary: Principal Problem:   Schizophrenia ([place]) Active Problems:   Hypertension   Type 2 diabetes mellitus ([place])   Schizophrenia by hx Patient continued to be delusional and disoriented despite up titration of Clozaril, and per staff, he is heard talking to himself in the evening.  This may be his new functional baseline, however  unclear.  Patient is still drowsy in the morning.  Given likely fixed delusions consistent with past hospitalization and drowsiness, will hold off on any med changes.  He is not noted to be drooling when he is awake and walking around, appeared to be consistent with only during sleep/falling asleep.  -ACTT referral per CSW, will be able to follow up and assist patient with monitoring required with Clozaril. -Continued to Haldol 5 mg qAM & 15 mg qPM due daytime sedation - goal to titrate down on Haldol over time as he stabilizes on Clozaril -Continued Clozaril 50 mg qAM & 100 mg qPM (first dose of 100 mg 2/3) - WBC 6.0 and ANC 3200 on 2/3, Troponin 3 on 05/01/21, CRP 1.2 on 05/01/21 - repeat labs due 05/08/21 - QTC 428m on 1/31 - will repeat EKG for QTC monitoring with Clozaril titration - A1c 9.2 and lipids WNL in 11/22 - Continued senokot daily for Clozaril-induced constipation prevention -Continued Lithium 450 mg BID for mood stability - Li level 0.67 2/3 - Cr 1.09 - trending closely while on Li and encouraging po hydration - TSH WNL   T2DM with insulin dependence A1C of 9.2. Diabetic coordinator on-board for insulin management -Continued insulin per diabetic coordinator with increase in 70/30 to 52u with breakfast and continue 70/30 35u with supper -Continued SSI moderate -Continued HS coverage   HTN Elevated today, will consider increasing antihypertensive. Continue home regimen and rechecking manual BP -Lisinopril 284mdaily (reduced due to concern for creatinine) -Amlodipine 10 mg daily -Clonidine 0.2 mg BID -Atenolol 12.22m52maily  Elevated Cr, resolved Admission Cr 1.4 on a baseline of 1-1.3. Cr 1.09 on repeat on 05/01/21.  -Med consulted, stated that Cr is at baseline and no med changes required currently -Continued lisinopril 64m -Continued Fluid hydration encouragement   Mild Hyponatremia 2/2 hyperglycemia. Corrected for serum glc, actual Na+ 136. - Repeat Na+ 134 on 05/01/21 -  improving as blood sugars are better controlled - Trending labs per above  Elevated transaminases, stable Repeat 05/01/21: AST 82 and ALT 136; Alk phos 139 - Med consulted, stated that no further work up necessary - Hepatitis panel nonreactive, ammonia 35, negative RUQ u/s 02/10/21 - Will need outpatient f/u after discharge - Trending labs per above  DISPO: Pending ACTT interview to follow-up with clozaril monitoring and clinical improvement  Signed: JMerrily Brittle DO  Psychiatry Resident, PGY-1 [facility]2/06/2021, 10:50 AM

## 2021-05-02 NOTE — Progress Notes (Signed)
Inpatient Diabetes Program Recommendations  AACE/ADA: New Consensus Statement on Inpatient Glycemic Control (2015)  Target Ranges:  Prepandial:   less than 140 mg/dL      Peak postprandial:   less than 180 mg/dL (1-2 hours)      Critically ill patients:  140 - 180 mg/dL   Lab Results  Component Value Date   GLUCAP 289 (H) 05/02/2021   HGBA1C 9.2 (H) 04/24/2021    Review of Glycemic Control  Latest Reference Range & Units 05/01/21 05:36 05/01/21 11:51 05/01/21 17:02 05/01/21 19:33 05/02/21 05:47 05/02/21 11:24  Glucose-Capillary 70 - 99 mg/dL 983 (H) 382 (H) 505 (H) 340 (H) 177 (H) 289 (H)  (H): Data is abnormally high   Current orders for Inpatient glycemic control: 70/30 48 units with breakfast and 35 unit with supper  Inpatient Diabetes Program Recommendations:    70/30 52 units with breakfast  Will continue to follow while inpatient.  Thank you, Dulce Sellar, MSN, RN Diabetes Coordinator Inpatient Diabetes Program [phone] (team pager from 8a-5p)

## 2021-05-02 NOTE — Plan of Care (Addendum)
Pt has been ambulatory on the unit today. Pt has been in the day room and paced the halls some today. Pt took his medications without any prompting. Pt continues to be observed responding to internal stimuli. Pt was provided support and encouragement. Pt remains safe on and off the unit.  Problem: Coping: Goal: Ability to demonstrate self-control will improve Outcome: Progressing   Problem: Health Behavior/Discharge Planning: Goal: Compliance with treatment plan for underlying cause of condition will improve Outcome: Progressing   Problem: Safety: Goal: Periods of time without injury will increase Outcome: Progressing   Problem: Health Behavior/Discharge Planning: Goal: Compliance with prescribed medication regimen will improve Outcome: Progressing   Problem: Safety: Goal: Ability to redirect hostility and anger into socially appropriate behaviors will improve Outcome: Progressing

## 2021-05-02 NOTE — Progress Notes (Signed)
°   05/02/21 0530  Sleep  Number of Hours 5.25

## 2021-05-02 NOTE — Group Note (Signed)
LCSW Group Therapy Note ° °No therapy group could be held today due to other needs on the unit that were prioritized.  The patient did not go without group, as another licensed group was held by an RN. ° °Brienne Liguori Grossman-Orr, LCSW °01/17/2021 °10:10 AM  °   °

## 2021-05-02 NOTE — Progress Notes (Signed)
Adult Psychoeducational Group Note  Date:  05/02/2021 Time:  8:21 PM  Group Topic/Focus:  Wrap-Up Group:   The focus of this group is to help patients review their daily goal of treatment and discuss progress on daily workbooks.  Participation Level:  Active  Participation Quality:  Appropriate  Affect:  Appropriate  Cognitive:  Appropriate  Insight: Appropriate  Engagement in Group:  Engaged  Modes of Intervention:  Discussion  Additional Comments:  Pt stated his goal for today was to focus on his treatment plan. Pt stated he accomplished his goal today. Pt stated he talked with his doctor and with his social worker about his care today. Pt rated his overall day a 10. Pt stated he was able to contact his aunt today which improved his overall day. Pt stated he felt better about himself today. Pt stated he was able to attend all meals. Pt stated he took all medications provided today. Pt stated he attend all groups held today. Pt stated his appetite was pretty good today. Pt rated sleep last night was pretty good. Pt stated the goal tonight was to get some rest. Pt stated he had no physical pain tonight. Pt deny visual hallucinations and auditory issues tonight. Pt denies thoughts of harming himself or others. Pt stated he would alert staff if anything changed    Mitchell [name] 05/02/2021, 8:21 PM

## 2021-05-03 DIAGNOSIS — F250 Schizoaffective disorder, bipolar type: Secondary | ICD-10-CM | POA: Diagnosis not present

## 2021-05-03 LAB — GLUCOSE, CAPILLARY
Glucose-Capillary: 172 mg/dL — ABNORMAL HIGH (ref 70–99)
Glucose-Capillary: 338 mg/dL — ABNORMAL HIGH (ref 70–99)
Glucose-Capillary: 137 mg/dL — ABNORMAL HIGH (ref 70–99)
Glucose-Capillary: 127 mg/dL — ABNORMAL HIGH (ref 70–99)

## 2021-05-03 NOTE — Progress Notes (Signed)

## 2021-05-03 NOTE — Progress Notes (Signed)
Pt observed to be irritable with elective mutism on interactions this shift. Agitated about changes in meal plan due to elevated glucose levels "It's fine, I eat however way I want to eat. I told you my blood sugar goes up when I'm happy. I eat like that at home all the time. Remains delusional about his name "My name is the same, my original name is the same; [name] and stop asking me about it". Denies SI, HI, AVH and pain when assessed. Observed without actively responding to internal stimuli thus far this shift. Pt remains medication compliant, denies adverse drug reactions. Pt observed in dayroom at intervals for groups and leisure time.  Safety checks maintained at Q 15 minutes intervals without outburst or self harm gestures. All medications administered with verbal education and effects monitored. Pt safe on unit. Continues to need frequent verbal redirections to comply with meal plan.

## 2021-05-03 NOTE — Group Note (Signed)
°  [facility] LCSW Group Therapy Note  Date/Time:  05/03/2021 10:00AM-11:00AM  Type of Therapy and Topic:  Group Therapy:  Ways to Love Myself and Take Care of Myself  Participation Level:  Did Not Attend   Description of Group This process group started with group leader playing a song entitled "Love Me More" to facilitate a discussion about the need to love and respect ourselves and prioritize taking care of ourselves, especially after hospital discharge.   There was a discussion about the need for self-love, and patients listed ways in which they are demonstrating self-love while in the hospital.  Patients were then asked to share how they plan to take care of themselves in a better manner when they get home from the hospital.  Group members shared ideas about making changes when they return home so that they can stay well and in recovery.    Therapeutic Goals Patient will listen and be able to relate to a song about prioritizing themselves through self-love Patient will participate in generating ideas about healthy self-care options when they return to the community Patients will be supportive of one another and receive said support from others   Summary of Patient Progress:  The patient did not arrive in group until the last few minutes.   Therapeutic Modalities Activity Motivational Interviewing Processing   Ambrose Mantle, LCSW 05/03/2021, 12:00pm

## 2021-05-03 NOTE — Progress Notes (Signed)
Pt was encouraged but didn't attend orientation/goals group. ?

## 2021-05-03 NOTE — Progress Notes (Signed)
Adult Psychoeducational Group Note  Date:  05/03/2021 Time:  8:39 PM  Group Topic/Focus:  Wrap-Up Group:   The focus of this group is to help patients review their daily goal of treatment and discuss progress on daily workbooks.  Participation Level:  Active  Participation Quality:  Appropriate  Affect:  Appropriate  Cognitive:  Appropriate  Insight: Appropriate  Engagement in Group:  Engaged  Modes of Intervention:  Discussion  Additional Comments:  Pt stated his goal for today was to focus on his treatment plan. Pt stated he accomplished his goal today. Pt stated he talked with his doctor and with his social worker about his care today. Pt rated his overall day a 10. Pt stated he made no calls today. Pt stated he felt better about himself today. Pt stated staff brought back all meals for him today because of his blood sugar issues. Pt stated he took all medications provided today. Pt stated his appetite was pretty good today. Pt rated sleep last night was pretty good. Pt stated the goal tonight was to get some rest. Pt stated he had no physical pain tonight. Pt deny visual hallucinations and auditory issues tonight. Pt denies thoughts of harming himself or others. Pt stated he would alert staff if anything changed  [name] 05/03/2021, 8:39 PM

## 2021-05-03 NOTE — Progress Notes (Signed)
°   05/03/21 2048  Psych Admission Type (Psych Patients Only)  Admission Status Involuntary  Psychosocial Assessment  Patient Complaints None  Eye Contact Fair  Facial Expression Flat  Affect Preoccupied  Speech Logical/coherent  Interaction Assertive  Motor Activity Other (Comment) (WDL)  Appearance/Hygiene Disheveled  Behavior Characteristics Cooperative;Appropriate to situation  Mood Pleasant  Thought Chartered certified accountant of ideas  Content Magical thinking  Delusions None reported or observed  Perception Hallucinations  Hallucination None reported or observed  Judgment Limited  Confusion Moderate  Danger to Self  Current suicidal ideation? Denies  Danger to Others  Danger to Others None reported or observed

## 2021-05-03 NOTE — Progress Notes (Addendum)
[facility] MD Progress Note  05/03/2021 3:51 PM Mitchell [name]  MRN:  [id number]  Chief Complaint: delusions and SI  Subjective:   Mitchell [name] is a 61 y.o. male with PPHx of Schizophrenia and multiple hospitalizations, who presented to [facility] via IVC for delusion that he is [name] and threatening to jump into a trash compactor, then admitted [facility]voluntary to [facility] for treatment of schizophrenia exacerbated by medication selective adherence.  Overnight: The patient's chart was reviewed and nursing notes were reviewed. Over the past 24 hrs, there were no documented behavioral issues, no PRN medications given for agitation, and the patient was compliant with scheduled medications. The patient's case was discussed in multidisciplinary team meeting.   On interview and assessment this morning, the patient appears similar to what is documented for previous days.  He appears delusional with a nonspontaneous thought process.  He reports that he is "[name]" and states that he does not know who the president is.  He stares off frequently during the interview when frustrated and selectively does not respond to questions. The patient denies auditory/visual hallucinations and first rank symptoms.  He reports good mood, appetite, and sleep. He denies suicidal and homicidal thoughts. He denies side effects from his medications.  Review of systems as below.   Principal Problem: Schizophrenia ([place]) Diagnosis: Principal Problem:   Schizophrenia ([place]) Active Problems:   Hypertension   Type 2 diabetes mellitus ([place])  Total Time Spent in Direct Patient Care: I personally spent 30 minutes on the unit in direct patient care. The direct patient care time included face-to-face time with the patient, reviewing the patient's chart, communicating with other professionals, and coordinating care. Greater than 50% of this time was spent in counseling or coordinating care with the patient  regarding goals of hospitalization, psycho-education, and discharge planning needs.   Past Psychiatric History: Past psych diagnoses: Schizophrenia Prior inpatient treatment: [facility] ~14x (latest 01/28/2021), [facility] ~2020 Suicide attempts:  Psychiatric med trials: Zyprexa, loxapine, paliperidone injection/156 mg, Geodon Neuromodulation history: N/A Current outpatient psychiatrist:  Current outpatient therapist:  History of selective adherence: Yes.    Past Medical History:  Past Medical History:  Diagnosis Date   Bipolar affective disorder ([place])    takes Zyprexa daily   Coarse tremors 10/02/2014   Diabetes mellitus    takes Victoza,Metformin,and Glipizide daily   Hypertension    takes Amlodipine,Lisinopril and Clonidine daily   Hyponatremia    history of   Lithium toxicity 10/02/2014   Mental disorder    takes Lithium daily   Schizoaffective disorder    takes Trazodone nightly   Schizoaffective disorder ([place]) 01/04/2019   Seasonal allergies    takes Claritin daily   Sleep apnea    sleep study >63yr ago   Stroke ([facility]    left arm weakness    Past Surgical History:  Procedure Laterality Date   CATARACT EXTRACTION W/PHACO Right 02/14/2013   Procedure: CATARACT EXTRACTION PHACO AND INTRAOCULAR LENS PLACEMENT ([place];  Surgeon: GAdonis Brook MD;  [place]  Service: Ophthalmology;  Laterality: Right;   CATARACT EXTRACTION W/PHACO Left 06/13/2013   Procedure: CATARACT EXTRACTION PHACO AND INTRAOCULAR LENS PLACEMENT ([facility]);  Surgeon: GAdonis Brook MD;  [place]  Service: Ophthalmology;  Laterality: Left;   CIRCUMCISION  20 yrs. ago   EYE SURGERY     Family History:see H&P  Family Psychiatric  History: unable to obtain  Social History:  Social History  Substance and Sexual Activity  Alcohol Use Not Currently     Social History   Substance and Sexual Activity  Drug Use No    Social History   Socioeconomic History   Marital status: Divorced    Spouse  name: Not on file   Number of children: Not on file   Years of education: Not on file   Highest education level: Not on file  Occupational History   Not on file  Tobacco Use   Smoking status: Never   Smokeless tobacco: Never  Vaping Use   Vaping Use: Never used  Substance and Sexual Activity   Alcohol use: Not Currently   Drug use: No   Sexual activity: Yes    Birth control/protection: None  Other Topics Concern   Not on file  Social History Narrative   Not on file   Social Determinants of Health   Financial Resource Strain: Not on file  Food Insecurity: Not on file  Transportation Needs: Not on file  Physical Activity: Not on file  Stress: Not on file  Social Connections: Not on file   Sleep: Good  Appetite: Good  Current Medications: Current Facility-Administered Medications  Medication Dose Route Frequency Provider Last Rate Last Admin   acetaminophen (TYLENOL) tablet 650 mg  650 mg Oral Q6H PRN Lucky Rathke, FNP       alum & mag hydroxide-simeth (MAALOX/MYLANTA) 200-200-20 MG/5ML suspension 30 mL  30 mL Oral Q4H PRN Lucky Rathke, FNP       amLODipine (NORVASC) tablet 10 mg  10 mg Oral QHS Merrily Brittle, DO   10 mg at 05/02/21 2036   aspirin EC tablet 81 mg  81 mg Oral Daily Lucky Rathke, FNP   81 mg at 05/03/21 1004   atenolol (TENORMIN) tablet 12.5 mg  12.5 mg Oral Daily Lucky Rathke, FNP   12.5 mg at 05/03/21 1004   cloNIDine (CATAPRES) tablet 0.2 mg  0.2 mg Oral BID Lucky Rathke, FNP   0.2 mg at 05/03/21 1005   cloZAPine (CLOZARIL) tablet 100 mg  100 mg Oral QHS Nelda Marseille, Gibson Lad E, MD   100 mg at 05/02/21 2037   cloZAPine (CLOZARIL) tablet 50 mg  50 mg Oral Daily Nelda Marseille, Offie Waide E, MD   50 mg at 05/03/21 1003   haloperidol (HALDOL) tablet 15 mg  15 mg Oral QHS Viann Fish E, MD   15 mg at 05/02/21 2037   haloperidol (HALDOL) tablet 5 mg  5 mg Oral Daily Nelda Marseille, Marcina Kinnison E, MD   5 mg at 05/03/21 1003   insulin aspart (novoLOG) injection 0-15 Units  0-15 Units  Subcutaneous TID WC Merrily Brittle, DO   11 Units at 05/03/21 1302   insulin aspart (novoLOG) injection 0-5 Units  0-5 Units Subcutaneous QHS Merrily Brittle, DO   5 Units at 05/02/21 2034   insulin aspart protamine- aspart (NOVOLOG MIX 70/30) injection 35 Units  35 Units Subcutaneous Q supper Merrily Brittle, DO   35 Units at 05/02/21 1734   insulin aspart protamine- aspart (NOVOLOG MIX 70/30) injection 52 Units  52 Units Subcutaneous Q breakfast Merrily Brittle, DO   52 Units at 05/03/21 0957   lisinopril (ZESTRIL) tablet 20 mg  20 mg Oral QHS Nelda Marseille, Meshelle Holness E, MD   20 mg at 05/02/21 2036   lithium carbonate (ESKALITH) CR tablet 450 mg  450 mg Oral Q12H Lucky Rathke, FNP   450 mg at 05/03/21 1003   OLANZapine zydis (ZYPREXA)  disintegrating tablet 10 mg  10 mg Oral Q8H PRN Corky Sox, MD       And   LORazepam (ATIVAN) tablet 1 mg  1 mg Oral PRN Corky Sox, MD       And   ziprasidone (GEODON) injection 20 mg  20 mg Intramuscular PRN Corky Sox, MD       magnesium hydroxide (MILK OF MAGNESIA) suspension 30 mL  30 mL Oral Daily PRN Lucky Rathke, FNP       senna (SENOKOT) tablet 8.6 mg  1 tablet Oral Daily Merrily Brittle, DO   8.6 mg at 05/03/21 1003   traZODone (DESYREL) tablet 50 mg  50 mg Oral QHS PRN Lucky Rathke, FNP   50 mg at 05/01/21 2042    Lab Results:  Results for orders placed or performed during the hospital encounter of 04/23/21 (from the past 48 hour(s))  Glucose, capillary     Status: Abnormal   Collection Time: 05/01/21  5:02 PM  Result Value Ref Range   Glucose-Capillary 328 (H) 70 - 99 mg/dL    Comment: Glucose reference range applies only to samples taken after fasting for at least 8 hours.  Glucose, capillary     Status: Abnormal   Collection Time: 05/01/21  7:33 PM  Result Value Ref Range   Glucose-Capillary 340 (H) 70 - 99 mg/dL    Comment: Glucose reference range applies only to samples taken after fasting for at least 8 hours.  Glucose, capillary      Status: Abnormal   Collection Time: 05/02/21  5:47 AM  Result Value Ref Range   Glucose-Capillary 177 (H) 70 - 99 mg/dL    Comment: Glucose reference range applies only to samples taken after fasting for at least 8 hours.  Glucose, capillary     Status: Abnormal   Collection Time: 05/02/21 11:24 AM  Result Value Ref Range   Glucose-Capillary 289 (H) 70 - 99 mg/dL    Comment: Glucose reference range applies only to samples taken after fasting for at least 8 hours.  Glucose, capillary     Status: Abnormal   Collection Time: 05/02/21  4:36 PM  Result Value Ref Range   Glucose-Capillary 337 (H) 70 - 99 mg/dL    Comment: Glucose reference range applies only to samples taken after fasting for at least 8 hours.  Glucose, capillary     Status: Abnormal   Collection Time: 05/02/21  7:08 PM  Result Value Ref Range   Glucose-Capillary 435 (H) 70 - 99 mg/dL    Comment: Glucose reference range applies only to samples taken after fasting for at least 8 hours.  Glucose, capillary     Status: Abnormal   Collection Time: 05/02/21 10:39 PM  Result Value Ref Range   Glucose-Capillary 259 (H) 70 - 99 mg/dL    Comment: Glucose reference range applies only to samples taken after fasting for at least 8 hours.  Glucose, capillary     Status: Abnormal   Collection Time: 05/03/21  5:36 AM  Result Value Ref Range   Glucose-Capillary 172 (H) 70 - 99 mg/dL    Comment: Glucose reference range applies only to samples taken after fasting for at least 8 hours.  Glucose, capillary     Status: Abnormal   Collection Time: 05/03/21 12:02 PM  Result Value Ref Range   Glucose-Capillary 338 (H) 70 - 99 mg/dL    Comment: Glucose reference range applies only to samples taken after fasting for at least  8 hours.    Blood Alcohol level:  Lab Results  Component Value Date   ETH <10 04/16/2021   ETH <10 75/88/3254    Metabolic Disorder Labs: Lab Results  Component Value Date   HGBA1C 9.2 (H) 04/24/2021   MPG 217.34  04/24/2021   MPG 271.87 01/26/2021   No results found for: PROLACTIN Lab Results  Component Value Date   CHOL 140 01/30/2021   TRIG 95 01/30/2021   HDL 49 01/30/2021   CHOLHDL 2.9 01/30/2021   VLDL 19 01/30/2021   LDLCALC 72 01/30/2021   LDLCALC 67 05/20/2020   Psychiatric Specialty Exam: Physical Exam Vitals and nursing note reviewed.  HENT:     Head: Normocephalic.  Pulmonary:     Effort: No respiratory distress.  Neurological:     General: No focal deficit present.     Mental Status: He is alert.     Gait: Gait normal.  Psychiatric:        Behavior: Behavior is cooperative.    Review of Systems  Respiratory:  Negative for shortness of breath.   Cardiovascular:  Negative for chest pain.  Gastrointestinal:  Negative for nausea and vomiting.  Neurological:  Negative for headaches.    Body mass index is 37.42 kg/m. Temp:  [98.4 F (36.9 C)] 98.4 F (36.9 C) (02/05 0558) Pulse Rate:  [93-104] 104 (02/05 0559) BP: (117-142)/(77-79) 117/79 (02/05 0559) SpO2:  [99 %-100 %] 99 % (02/05 0558)  General Appearance:  Older male, dressed in a bright pink shirt  Eye Contact:   Fair    Speech:   Mumbling quality at times but coherent    Volume:   Normal    Mood:   Irritable, aloof    Affect:   Constricted    Thought Process:   Concrete, linear   Duration of Psychotic Symptoms:  Greater than six months  Past Diagnosis of Schizophrenia or Psychoactive disorder:  Yes   Orientation:   Disoriented to events, oriented to month and year    Thought Content:  Illogical - Has delusions that he is [name]; denies AVH, ideas of reference, or first rank symptoms; is not grossly responding to internal/external stimuli on exam  Ideas of reference: None    Suicidal Thoughts:   No    Homicidal Thoughts:   No    Memory:   Limited     Judgement:   Impaired    Insight:   Lacking    Psychomotor Activity:   No tremor, steady gait    Concentration:    Fair    Attention Span:   Fair   Recall:   Poor    Fund of Knowledge:   Limited    Language:   Good    Handed:   Right    Assets:   Leisure Time    ADL's:  Intact  Sleep:   Total hours unrecorded  AIMS: Facial and Oral Movements Muscles of Facial Expression: None, normal Lips and Perioral Area: None, normal Jaw: None, normal Tongue: None, normal,Extremity Movements Upper (arms, wrists, hands, fingers): None, normal Lower (legs, knees, ankles, toes): None, normal, Trunk Movements Neck, shoulders, hips: None, normal, Overall Severity Severity of abnormal movements (highest score from questions above): None, normal Incapacitation due to abnormal movements: None, normal Patient's awareness of abnormal movements (rate only patient's report): No Awareness, Dental Status Current problems with teeth and/or dentures?: No Does patient usually wear dentures?: No        Treatment Plan Summary:  Daily contact with patient to assess and evaluate symptoms and progress in treatment and Medication management  Safety and Monitoring -- Involuntary admission to inpatient psychiatric unit for safety, stabilization and treatment -- Daily contact with patient to assess and evaluate symptoms and progress in treatment -- Patient's case to be discussed in multi-disciplinary team meeting -- Observation Level : q15 minute checks -- Vital signs:  q12 hours -- Precautions: suicide  Treatment Assessment & Plan Summary: Principal Problem:   Schizophrenia ([place]) Active Problems:   Hypertension   Type 2 diabetes mellitus ([place])   Schizophrenia by hx -ACTT referral per CSW, will be able to follow up and assist patient with monitoring required with Clozaril. -Continued to Haldol 5 mg qAM & 15 mg qPM due daytime sedation - goal to titrate down on Haldol over time as he stabilizes on Clozaril -Continued Clozaril 50 mg qAM & 100 mg qPM (first dose of 100 mg 2/3) - WBC 6.0 and ANC 3200 on 2/3,  Troponin 3 on 05/01/21, CRP 1.2 on 05/01/21 - repeat labs due 05/08/21 - Clozaril level ordered - QTC 480m on 1/31 - EKG re-ordered when he will cooperate for testing - A1c 9.2 and lipids WNL in 11/22 - Continued senokot daily for Clozaril-induced constipation prevention -Continued Lithium 450 mg BID for mood stability - Li level 0.67 2/3 - Cr 1.09 - trending closely while on Li and encouraging po hydration - TSH WNL   T2DM with insulin dependence A1C of 9.2. Diabetic coordinator on-board for insulin management -Continued insulin per diabetic coordinator with increase in 70/30 to 52u with breakfast and continue 70/30 35u with supper -Continued SSI moderate -Continued HS coverage -Placed on unit restriction for meals to decrease insulin burden and dietary/nutrition consult ordered   HTN Continue home regimen and rechecking manual BP -Lisinopril 232mdaily (reduced due to concern for creatinine) -Amlodipine 10 mg daily -Clonidine 0.2 mg BID -Atenolol 12.11m42maily   Elevated Cr, resolved Admission Cr 1.4 on a baseline of 1-1.3. Cr 1.09 on repeat on 05/01/21.  -Med consulted, stated that Cr is at baseline and no med changes required currently -Continued lisinopril 85m10montinued Fluid hydration encouragement   Mild Hyponatremia 2/2 hyperglycemia. Corrected for serum glc, actual Na+ 136. - Repeat Na+ 134 on 05/01/21 - improving  - Trending labs per above  Elevated transaminases, stable Repeat 05/01/21: AST 82 and ALT 136; Alk phos 139 - Med consulted, stated that no further work up necessary - Hepatitis panel nonreactive, ammonia 35, negative RUQ u/s 02/10/21 - Will need outpatient f/u after discharge - Trending labs per above  DISPO: Pending ACTT interview to follow-up with clozaril monitoring and clinical improvement  Signed: NickCorky Sox PGY-1

## 2021-05-04 ENCOUNTER — Telehealth (HOSPITAL_COMMUNITY): Payer: Self-pay

## 2021-05-04 DIAGNOSIS — F250 Schizoaffective disorder, bipolar type: Secondary | ICD-10-CM | POA: Diagnosis not present

## 2021-05-04 LAB — GLUCOSE, CAPILLARY
Glucose-Capillary: 173 mg/dL — ABNORMAL HIGH (ref 70–99)
Glucose-Capillary: 207 mg/dL — ABNORMAL HIGH (ref 70–99)
Glucose-Capillary: 171 mg/dL — ABNORMAL HIGH (ref 70–99)
Glucose-Capillary: 275 mg/dL — ABNORMAL HIGH (ref 70–99)

## 2021-05-04 MED ORDER — SENNA 8.6 MG PO TABS
2.0000 | ORAL_TABLET | Freq: Every day | ORAL | Status: DC
  Administered 2021-05-05 – 2021-05-27 (×23): 17.2 mg via ORAL
  Filled 2021-05-04 (×26): qty 2

## 2021-05-04 NOTE — [facility] Counselor (Signed)
CSW spoke with [facility] who states that Mitchell [name] will reach out if/when pt qualifies for their services to set up an intake. CSW asked to speak to Mitchell [name] and was told that she will not be back in the office until Wednesday.   Toney Reil, [place] Worker [employer]

## 2021-05-04 NOTE — Progress Notes (Addendum)
[facility] MD Progress Note  05/04/2021 6:29 PM RAKEEN GAILLARD  MRN:  [id number]  Chief Complaint: delusions and SI  Subjective:   Mitchell [name] is a 61 y.o. male with PPHx of Schizophrenia and multiple hospitalizations, who presented to [facility] via IVC for delusion that he is [name] and threatening to jump into a trash compactor, then admitted Involuntary to [facility] for treatment of schizophrenia exacerbated by medication selective adherence.  Overnight: The patient's chart was reviewed and nursing notes were reviewed. Over the past 24 hrs, there were no documented behavioral issues, no PRN medications given for agitation, and the patient was compliant with scheduled medications. The patient's case was discussed in multidisciplinary team meeting.   On interview and assessment this morning with attending. Patient appeared more awake and less irritable than previous days. Patient continued to report delusions that he is "[name]" and that if we were to call the bank, he would know that he is rich.  Today he is alert and oriented x4, reported that he is in a psych ward, February 2023, [place], [place], [place].  In response to the current president, he reported "I am" and declined to give another guess.  He reported stable sleep, appetite, mood, reporting that it is "great".  He reported that his last bowel movement was this morning, however he did feel constipated and requested increase in his constipation medicine to alleviate it.  He reported drooling only when he is sleeping, none during the day. Patient denied SI/HI/AVH, paranoia, first rank symptoms, and contracted to safety on the unit. Patient was not grossly responding to internal/external stimuli. Review of systems as below.   Principal Problem: Schizophrenia ([place]) Diagnosis: Principal Problem:   Schizophrenia ([place]) Active Problems:   Hypertension   Type 2 diabetes mellitus  ([place])  Total Time Spent in Direct Patient Care: I personally spent 30 minutes on the unit in direct patient care. The direct patient care time included face-to-face time with the patient, reviewing the patient's chart, communicating with other professionals, and coordinating care. Greater than 50% of this time was spent in counseling or coordinating care with the patient regarding goals of hospitalization, psycho-education, and discharge planning needs.   Past Psychiatric History: Past psych diagnoses: Schizophrenia Prior inpatient treatment: [place] ~14x (latest 01/28/2021), [facility] ~2020 Suicide attempts:  Psychiatric med trials: Zyprexa, loxapine, paliperidone injection/156 mg, Geodon Neuromodulation history: N/A Current outpatient psychiatrist:  Current outpatient therapist:  History of selective adherence: Yes.    Past Medical History:  Past Medical History:  Diagnosis Date   Bipolar affective disorder ([place])    takes Zyprexa daily   Coarse tremors 10/02/2014   Diabetes mellitus    takes Victoza,Metformin,and Glipizide daily   Hypertension    takes Amlodipine,Lisinopril and Clonidine daily   Hyponatremia    history of   Lithium toxicity 10/02/2014   Mental disorder    takes Lithium daily   Schizoaffective disorder    takes Trazodone nightly   Schizoaffective disorder ([place]) 01/04/2019   Seasonal allergies    takes Claritin daily   Sleep apnea    sleep study >56yr ago   Stroke ([facility]    left arm weakness    Past Surgical History:  Procedure Laterality Date   CATARACT EXTRACTION W/PHACO Right 02/14/2013   Procedure: CATARACT EXTRACTION PHACO AND INTRAOCULAR LENS PLACEMENT ([place]  Surgeon: GAdonis Brook MD;  [place]  Service: Ophthalmology;  Laterality: Right;   CATARACT  EXTRACTION W/PHACO Left 06/13/2013   Procedure: CATARACT EXTRACTION PHACO AND INTRAOCULAR LENS PLACEMENT (IOC);  Surgeon: Adonis Brook, MD;  Location: [place];  Service: Ophthalmology;  Laterality: Left;    CIRCUMCISION  20 yrs. ago   EYE SURGERY     Family History:see H&P  Family Psychiatric History: unable to obtain  Social History:  Social History   Substance and Sexual Activity  Alcohol Use Not Currently     Social History   Substance and Sexual Activity  Drug Use No    Social History   Socioeconomic History   Marital status: Divorced    Spouse name: Not on file   Number of children: Not on file   Years of education: Not on file   Highest education level: Not on file  Occupational History   Not on file  Tobacco Use   Smoking status: Never   Smokeless tobacco: Never  Vaping Use   Vaping Use: Never used  Substance and Sexual Activity   Alcohol use: Not Currently   Drug use: No   Sexual activity: Yes    Birth control/protection: None  Other Topics Concern   Not on file  Social History Narrative   Not on file   Social Determinants of Health   Financial Resource Strain: Not on file  Food Insecurity: Not on file  Transportation Needs: Not on file  Physical Activity: Not on file  Stress: Not on file  Social Connections: Not on file   Sleep: Good  Appetite: Good  Current Medications: Current Facility-Administered Medications  Medication Dose Route Frequency Provider Last Rate Last Admin   acetaminophen (TYLENOL) tablet 650 mg  650 mg Oral Q6H PRN Lucky Rathke, FNP       alum & mag hydroxide-simeth (MAALOX/MYLANTA) 200-200-20 MG/5ML suspension 30 mL  30 mL Oral Q4H PRN Lucky Rathke, FNP       amLODipine (NORVASC) tablet 10 mg  10 mg Oral QHS Merrily Brittle, DO   10 mg at 05/03/21 2048   aspirin EC tablet 81 mg  81 mg Oral Daily Lucky Rathke, FNP   81 mg at 05/04/21 0750   atenolol (TENORMIN) tablet 12.5 mg  12.5 mg Oral Daily Lucky Rathke, FNP   12.5 mg at 05/04/21 0750   cloNIDine (CATAPRES) tablet 0.2 mg  0.2 mg Oral BID Lucky Rathke, FNP   0.2 mg at 05/04/21 1744   cloZAPine (CLOZARIL) tablet 100 mg  100 mg Oral QHS Nelda Marseille, Andriea Hasegawa E, MD   100 mg at  05/03/21 2046   cloZAPine (CLOZARIL) tablet 50 mg  50 mg Oral Daily Nelda Marseille, Daleigh Pollinger E, MD   50 mg at 05/04/21 0751   haloperidol (HALDOL) tablet 15 mg  15 mg Oral QHS Nelda Marseille, Nasiya Pascual E, MD   15 mg at 05/03/21 2047   haloperidol (HALDOL) tablet 5 mg  5 mg Oral Daily Nelda Marseille, Zakhari Fogel E, MD   5 mg at 05/04/21 0750   insulin aspart (novoLOG) injection 0-15 Units  0-15 Units Subcutaneous TID WC Merrily Brittle, DO   3 Units at 05/04/21 1738   insulin aspart (novoLOG) injection 0-5 Units  0-5 Units Subcutaneous QHS Merrily Brittle, DO   5 Units at 05/02/21 2034   insulin aspart protamine- aspart (NOVOLOG MIX 70/30) injection 35 Units  35 Units Subcutaneous Q supper Merrily Brittle, DO   35 Units at 05/04/21 1736   insulin aspart protamine- aspart (NOVOLOG MIX 70/30) injection 52 Units  52 Units Subcutaneous Q breakfast  Merrily Brittle, DO   52 Units at 05/04/21 0810   lisinopril (ZESTRIL) tablet 20 mg  20 mg Oral QHS Nelda Marseille, Selden Noteboom E, MD   20 mg at 05/03/21 2046   lithium carbonate (ESKALITH) CR tablet 450 mg  450 mg Oral Q12H Lucky Rathke, FNP   450 mg at 05/04/21 0750   OLANZapine zydis (ZYPREXA) disintegrating tablet 10 mg  10 mg Oral Q8H PRN Corky Sox, MD       And   LORazepam (ATIVAN) tablet 1 mg  1 mg Oral PRN Corky Sox, MD       And   ziprasidone (GEODON) injection 20 mg  20 mg Intramuscular PRN Corky Sox, MD       magnesium hydroxide (MILK OF MAGNESIA) suspension 30 mL  30 mL Oral Daily PRN Lucky Rathke, FNP       [START ON 05/05/2021] senna (SENOKOT) tablet 17.2 mg  2 tablet Oral Daily Merrily Brittle, DO       traZODone (DESYREL) tablet 50 mg  50 mg Oral QHS PRN Lucky Rathke, FNP   50 mg at 05/03/21 2046    Lab Results:  Results for orders placed or performed during the hospital encounter of 04/23/21 (from the past 48 hour(s))  Glucose, capillary     Status: Abnormal   Collection Time: 05/02/21  7:08 PM  Result Value Ref Range   Glucose-Capillary 435 (H) 70 - 99 mg/dL    Comment:  Glucose reference range applies only to samples taken after fasting for at least 8 hours.  Glucose, capillary     Status: Abnormal   Collection Time: 05/02/21 10:39 PM  Result Value Ref Range   Glucose-Capillary 259 (H) 70 - 99 mg/dL    Comment: Glucose reference range applies only to samples taken after fasting for at least 8 hours.  Glucose, capillary     Status: Abnormal   Collection Time: 05/03/21  5:36 AM  Result Value Ref Range   Glucose-Capillary 172 (H) 70 - 99 mg/dL    Comment: Glucose reference range applies only to samples taken after fasting for at least 8 hours.  Glucose, capillary     Status: Abnormal   Collection Time: 05/03/21 12:02 PM  Result Value Ref Range   Glucose-Capillary 338 (H) 70 - 99 mg/dL    Comment: Glucose reference range applies only to samples taken after fasting for at least 8 hours.  Glucose, capillary     Status: Abnormal   Collection Time: 05/03/21  4:59 PM  Result Value Ref Range   Glucose-Capillary 137 (H) 70 - 99 mg/dL    Comment: Glucose reference range applies only to samples taken after fasting for at least 8 hours.  Glucose, capillary     Status: Abnormal   Collection Time: 05/03/21  7:42 PM  Result Value Ref Range   Glucose-Capillary 127 (H) 70 - 99 mg/dL    Comment: Glucose reference range applies only to samples taken after fasting for at least 8 hours.  Glucose, capillary     Status: Abnormal   Collection Time: 05/04/21  5:30 AM  Result Value Ref Range   Glucose-Capillary 173 (H) 70 - 99 mg/dL    Comment: Glucose reference range applies only to samples taken after fasting for at least 8 hours.  Glucose, capillary     Status: Abnormal   Collection Time: 05/04/21 11:57 AM  Result Value Ref Range   Glucose-Capillary 207 (H) 70 - 99 mg/dL    Comment:  Glucose reference range applies only to samples taken after fasting for at least 8 hours.  Glucose, capillary     Status: Abnormal   Collection Time: 05/04/21  4:54 PM  Result Value Ref  Range   Glucose-Capillary 171 (H) 70 - 99 mg/dL    Comment: Glucose reference range applies only to samples taken after fasting for at least 8 hours.    Blood Alcohol level:  Lab Results  Component Value Date   ETH <10 04/16/2021   ETH <10 62/86/3817    Metabolic Disorder Labs: Lab Results  Component Value Date   HGBA1C 9.2 (H) 04/24/2021   MPG 217.34 04/24/2021   MPG 271.87 01/26/2021   No results found for: PROLACTIN Lab Results  Component Value Date   CHOL 140 01/30/2021   TRIG 95 01/30/2021   HDL 49 01/30/2021   CHOLHDL 2.9 01/30/2021   VLDL 19 01/30/2021   LDLCALC 72 01/30/2021   LDLCALC 67 05/20/2020   Psychiatric Specialty Exam: Physical Exam Vitals and nursing note reviewed.  HENT:     Head: Normocephalic.  Pulmonary:     Effort: No respiratory distress.  Neurological:     General: No focal deficit present.     Mental Status: He is alert.     Gait: Gait normal.  Psychiatric:        Behavior: Behavior is cooperative.    Review of Systems  Respiratory:  Negative for shortness of breath.   Cardiovascular:  Negative for chest pain and palpitations.  Gastrointestinal:  Positive for constipation (still having daily bowel movements). Negative for abdominal pain, nausea and vomiting.  Neurological:  Negative for tremors and headaches.    Body mass index is 37.42 kg/m. Temp:  [98.5 F (36.9 C)] 98.5 F (36.9 C) (02/06 0559) Pulse Rate:  [88-93] 90 (02/06 1625) BP: (110-131)/(69-84) 131/84 (02/06 1625) SpO2:  [99 %-100 %] 100 % (02/06 0559)  General Appearance:  Older male, casual dress, fair grooming in shorts but same t-shirt  Eye Contact:   Fair    Speech:   Mumbling quality at times but coherent, regular rate    Volume:   Normal    Mood:   Aloof, less irritable today    Affect:   Constricted    Thought Process:   Concrete, linear   Duration of Psychotic Symptoms:  Greater than six months  Past Diagnosis of Schizophrenia or Psychoactive  disorder:  Yes   Orientation:   Alert and oriented to location, city, state, year, month, county.  Not oriented to current president    Thought Content:  Illogical - Has delusions that he is [name], that he is the current president, that he is rich; denies AVH, ideas of reference, or first rank symptoms; is not grossly responding to internal/external stimuli on exam, appears paranoid at times on exam with questioning  Ideas of reference: None    Suicidal Thoughts:   No    Homicidal Thoughts:   No    Memory:   Limited     Judgement:   Impaired    Insight:   Lacking    Psychomotor Activity:   No tremor, steady gait    Concentration:   Fair    Attention Span:   Fair   Recall:   Poor    Fund of Knowledge:   Limited    Language:   Good    Handed:   Right    Assets:   Leisure Time    ADL's:  Intact  Sleep:   Total hours unrecorded  AIMS: Facial and Oral Movements Muscles of Facial Expression: None, normal Lips and Perioral Area: None, normal Jaw: None, normal Tongue: None, normal,Extremity Movements Upper (arms, wrists, hands, fingers): None, normal Lower (legs, knees, ankles, toes): None, normal, Trunk Movements Neck, shoulders, hips: None, normal, Overall Severity Severity of abnormal movements (highest score from questions above): None, normal Incapacitation due to abnormal movements: None, normal Patient's awareness of abnormal movements (rate only patient's report): No Awareness, Dental Status Current problems with teeth and/or dentures?: No Does patient usually wear dentures?: No   MOCA: 13/30      Treatment Plan Summary: Daily contact with patient to assess and evaluate symptoms and progress in treatment and Medication management  Safety and Monitoring -- Involuntary admission to inpatient psychiatric unit for safety, stabilization and treatment -- Daily contact with patient to assess and evaluate symptoms and progress in treatment --  Patient's case to be discussed in multi-disciplinary team meeting -- Observation Level : q15 minute checks -- Vital signs:  q12 hours -- Precautions: suicide  Treatment Assessment & Plan Summary: Principal Problem:   Schizophrenia ([place]) Active Problems:   Hypertension   Type 2 diabetes mellitus ([place])   Schizophrenia by hx -ACTT referral pending -Continue Haldol 5 mg qAM & 15 mg qPM due daytime sedation - goal to titrate down on Haldol over time as he stabilizes on Clozaril -Continued Clozaril 50 mg qAM & 100 mg qPM (first dose of 100 mg 2/3) - WBC 6.0 and ANC 3200 on 2/3, Troponin 3 on 05/01/21, CRP 1.2 on 05/01/21 - repeat labs due 05/08/21 - Clozaril level ordered and pending - QTC 468m on 1/31 - EKG re-ordered when he will cooperate for testing - A1c 9.2 and lipids WNL in 11/22 - Increase senokot to 2 tabs daily for Clozaril-induced constipation  -Continued Lithium 450 mg BID for mood stability - Li level 0.67 2/3 - Cr 1.09 - trending closely while on Li and encouraging po hydration - TSH WNL   T2DM with insulin dependence A1C of 9.2. Diabetic coordinator on-board for insulin management -Continued insulin per diabetic coordinator with increase in 70/30 to 52u with breakfast and continue 70/30 35u with supper -Continued SSI moderate -Continued HS coverage -Placed on unit restriction for meals to decrease insulin burden and dietary/nutrition consult ordered - Attempting to see if patient can administer own insulin with teaching by nursing   HTN Continue home regimen  -Lisinopril 2430mdaily (reduced due to concern for creatinine) -Amlodipine 10 mg daily -Clonidine 0.2 mg BID -Atenolol 12.30m58maily   Elevated Cr, resolved Admission Cr 1.4 on a baseline of 1-1.3. Cr 1.09 on repeat on 05/01/21.  -Med consulted, stated that Cr is at baseline and no med changes required currently -Continued lisinopril 47m37montinued Fluid hydration encouragement   Mild Hyponatremia 2/2  hyperglycemia. Corrected for serum glc, actual Na+ 136. - Repeat Na+ 134 on 05/01/21 - improving  - Trending labs per above  Elevated transaminases, stable Repeat 05/01/21: AST 82 and ALT 136; Alk phos 139 - Med consulted, stated that no further work up necessary - Hepatitis panel nonreactive, ammonia 35, negative RUQ u/s 02/10/21 - Will need outpatient f/u after discharge - Trending labs per above  [facility] impairment, [facility] Concern for ability to live independently and to complete IADLs, currently patient refuses to live in assisted living. Follow-up APS report per CSW for potential guardianship Follow-up OT consult and eval for SLUMS and pill box test to  assess if patient is able to live independently  Follow-up diabetic coordinator for education of insulin pen administration after OT eval  DISPO: Pending ACTT interview/APS report   Signed: Merrily Brittle, DO Psychiatry Resident, PGY-1 [facility] 05/04/2021, 6:31 PM

## 2021-05-04 NOTE — Evaluation (Signed)
Occupational Therapy Evaluation Patient Details Name: Mitchell [name] MRN: [id number] DOB: May 20, 1960 Today's Date: 05/04/2021   History of Present Illness Patient is a 61 year old male who presented with delusions of being "[name]". patient was admitted for treatment of schizophrenia exacerbated by medication selective adherence.  PMH: HTN, Dm, schioaffective disorder, CVA.   Clinical Impression   Patient is a 61 year old confused male who was admitted for above. Patient indicated living at home alone prior level with no family present at this time. Currently, patient has poor insight to deficits with increased safety risk with medications at this time. Patient reported that he was currently in [place] at spring time when asked. Patient was resistive to problem solving questions reporting " I will do what I have to do" when asked to talk through meal prep task. Patient scored 14/30 on Mini Mental State Exam indicated severe cognitive impairment. Patient is unable to complete pill box test. Patient was noted to take all pills out of first presented bottle and stick them in various areas on pill box none of which were the "bed time" section as stated on pill bottle. Patient will not be able to safely manage own medication in next level of care. Patient will need 24/7 caregiver support to assist with IADLs and medication management to be successful in next level of care.  Patient demonstrated no new learning abilities at this time. OT is signing off at this time. Thank you for this referral.      Recommendations for follow up therapy are one component of a multi-disciplinary discharge planning process, led by the attending physician.  Recommendations may be updated based on patient status, additional functional criteria and insurance authorization.   Follow Up Recommendations  No OT follow up    Assistance Recommended at Discharge Frequent or constant Supervision/Assistance  Patient can return home  with the following A little help with walking and/or transfers;A little help with bathing/dressing/bathroom;Assistance with cooking/housework;Direct supervision/assist for financial management;Assist for transportation;Direct supervision/assist for medications management;Help with stairs or ramp for entrance    Functional Status Assessment     Equipment Recommendations  None recommended by OT    Recommendations for Other Services       Precautions / Restrictions Precautions Precautions: Fall Restrictions Weight Bearing Restrictions: No      Mobility Bed Mobility                    Transfers                          Balance                                           ADL either performed or assessed with clinical judgement   ADL Overall ADL's : At baseline                                       General ADL Comments: patient has poor safety awarness with patient able to complete toileting MI when therapist first arrived. patient was asked to don/doff sock sitting in chair . patient was unable to pick heel up off the floor. patient reported he gets onto knees to reach his feet to don/doff socks. patient then demonstrated  getting onto knees on floor with controlled discent and don/doffing socks then standing back up with hand support on table. patient was educated on completing this task at bed level to prevent getting on/off the floor. patient verbalized Vivi Ferns but this Clinical research associate doubts it will be carried over. patient is a high falls risk with impulsive actions and noted defensive speech when discussing transitioning home. patient was asked after explaining pill box if family could assist with this at home. patient reported " i do my own medications". attempted to engage patient in sequencing of task for meals. patient reported " i do what i have to do". unable to get patient to engage in conversation.     Vision   Additional  Comments: able to read pill box label with no difficulty.     Perception     Praxis      Pertinent Vitals/Pain Pain Assessment Pain Assessment: Faces Faces Pain Scale: Hurts a little bit Pain Location: back with attempts at donning/doffing socks Pain Descriptors / Indicators: Grimacing, Discomfort Pain Intervention(s): Monitored during session     Hand Dominance Right   Extremity/Trunk Assessment Upper Extremity Assessment Upper Extremity Assessment: Overall WFL for tasks assessed   Lower Extremity Assessment Lower Extremity Assessment: Overall WFL for tasks assessed   Cervical / Trunk Assessment Cervical / Trunk Assessment: Kyphotic   Communication Communication Communication: No difficulties   Cognition Arousal/Alertness: Awake/alert Behavior During Therapy: Flat affect Overall Cognitive Status: Difficult to assess                                 General Comments: unclear of baseline as no family was present. patient reported currently being in [place] in spring time". patient was unable to complete pill box test. patient was providwed with one pill bottle with patient able to read lable allowed. patient was asked to fill pill box according to medication label with patient placing all pills into hand and putting multiple pills into pill box container in all of the morning medication spots. patient was educated on using bedtime slot as it states " take one pill a day at bedtime". patient then transioned pills from morning to all the different sections of the pill box. patient unable to complete task appropriately at this time. patient completed Mini Mental State Exam scoring 14/30 indicating severe cognitive imparment. patient was able to read "close your eyes" sign and was unable to preform task.     General Comments       Exercises     Shoulder Instructions      Home Living Family/patient expects to be discharged to:: Private residence Living  Arrangements: Alone   Type of Home: House Home Access: Stairs to enter [employer] of Steps: 3 Entrance Stairs-Rails: Can reach both Home Layout: One level     Bathroom Shower/Tub: Tub/shower unit;Walk-in shower (unable to state what type of shower is at home. patient reported low threshold but signale with hand high threshold.)         Home Equipment: None          Prior Functioning/Environment Prior Level of Function : Independent/Modified Independent               ADLs Comments: patient is a poor historian with Case manager reporting that patient has been admitted to [facility] in past.        OT Problem List: Decreased cognition;Decreased safety awareness  OT Treatment/Interventions:      OT Goals(Current goals can be found in the care plan section) Acute Rehab OT Goals OT Goal Formulation: All assessment and education complete, DC therapy  OT Frequency:      Co-evaluation              AM-PAC OT "6 Clicks" Daily Activity     Outcome Measure Help from another person eating meals?: None Help from another person taking care of personal grooming?: None Help from another person toileting, which includes using toliet, bedpan, or urinal?: A Little Help from another person bathing (including washing, rinsing, drying)?: A Little Help from another person to put on and taking off regular upper body clothing?: None Help from another person to put on and taking off regular lower body clothing?: A Little 6 Click Score: 21   End of Session    Activity Tolerance: Patient tolerated treatment well Patient left: Other (comment) (in room with other staff members on unit)  OT Visit Diagnosis: Other abnormalities of gait and mobility (R26.89);Other symptoms and signs involving cognitive function                Time: 9169-4503 OT Time Calculation (min): 25 min Charges:  OT General Charges $OT Visit: 1 Visit OT Evaluation $OT Eval Moderate Complexity: 1  Mod OT Treatments $Therapeutic Activity: 8-22 mins  Sharyn Blitz OTR/L, MS Acute Rehabilitation Department Office# [phone] Pager# [phone]   Ardyth Harps 05/04/2021, 2:33 PM

## 2021-05-04 NOTE — BHH Group Notes (Signed)
Spirituality group facilitated by Kathleen Argue, BCC.   Group Description: Group focused on topic of hope. Patients participated in facilitated discussion around topic, connecting with one another around experiences and definitions for hope. Group members engaged with visual explorer photos, reflecting on what hope looks like for them today. Group engaged in discussion around how their definitions of hope are present today in hospital.   Modalities: Psycho-social ed, Adlerian, Narrative, MI   Patient Progress: Tymar came in partway through group.  He had a difficult time hearing.  He answered a few questions when prompted, but then closed his eyes and appeared to be asleep.  Chaplain Dyanne Carrel, Bcc Pager, [phone] 10:11 PM

## 2021-05-04 NOTE — Group Note (Signed)
Type of Therapy and Topic:  Group Therapy:  Stress Management   Participation Level:  Active    Description of Group:  Patients in this group were introduced to the idea of stress and encouraged to discuss negative and positive ways to manage stress. Patients discussed specific stressors that they have in their life right now and the physical signs and symptoms associated with that stress.  Patient encouraged to come up with positive changes to assist with the stress upon discharge in order to prevent future hospitalizations.   They also worked as a group on developing a specific plan for several patients to deal with stressors through boundary-setting, psychoeducation and self care techniques   Therapeutic Goals:               1)  To discuss the positive and negative impacts of stress             2)  identify signs and symptoms of stress             3)  generate ideas for stress management             4)  offer mutual support to others regarding stress management             5)  Developing plans for ways to manage specific stressors upon discharge               Summary of Patient Progress:  Patient was disorganized through group and continues to want to be identified as Mitchell [name].  Patient discussed how his children are his main motivators in life and that fights/frustration and anger are behavioral consequences of stress.  Patient unable to identify anything that he would do to continue to care for himself when he leaves the hospital but was encouraged to process and think through some action items while he was getting treatment.    Therapeutic Modalities:   Motivational Interviewing Brief Solution-Focused Therapy   Brailon Don, LCSW, LCAS Clincal Social Worker  [facility]

## 2021-05-04 NOTE — Group Note (Signed)
Recreation Therapy Group Note   Group Topic:Communication  Group Date: 05/04/2021 Start Time: 1000 End Time: 1038 Facilitators: Victorino Sparrow, LRT,CTRS Location: [address] Dayroom   Goal Area(s) Addresses:  Patient will effectively listen to complete activity.  Patient will identify communication skills used to make activity successful.  Patient will identify how skills used during activity can be used to reach post d/c goals.      Group Description:  Geometric Drawings.  Three volunteers from the peer group will be shown an abstract picture with a particular arrangement of geometrical shapes.  Each round, one 'speaker' will describe the pattern, as accurately as possible without revealing the image to the group.  The remaining group members will listen and draw the picture to reflect how it is described to them. Patients with the role of 'listener' cannot ask clarifying questions but, may request that the speaker repeat a direction. Once the drawings are complete, the presenter will show the rest of the group the picture and compare how close each person came to drawing the picture. LRT will facilitate a post-activity discussion regarding effective communication and the importance of planning, listening, and asking for clarification in daily interactions with others.   Affect/Mood: N/A   Participation Level: Did not attend    Clinical Observations/Individualized Feedback:      Plan: Continue to engage patient in RT group sessions 2-3x/week.   Victorino Sparrow, LRT,CTRS 05/04/2021 12:28 PM

## 2021-05-04 NOTE — Progress Notes (Signed)
Nutrition Note  RD consulted for nutrition assessment "Difficulty managing DM".  INTERVENTION: 1. Placed "Carbohydrate Counting" handout in discharge instructions 2. Have patient attend nutrition group session if available during admission 3. Recommend referral to [facility] for outpatient DM education  Lab Results  Component Value Date   HGBA1C 9.2 (H) 04/24/2021    RD provided "Carbohydrate Counting for People with Diabetes" handout from the Academy of Nutrition and Dietetics in discharge instructions. Discusses different food groups and their effects on blood sugar, emphasizing carbohydrate-containing foods. Provides list of carbohydrates and recommended serving sizes of common foods.  Provides examples of ways to balance meals/snacks and encouraged intake of high-fiber, whole grain complex carbohydrates.  Expect fair compliance as pt still being treated for schizophrenia. Recommend referral for outpatient diabetes education at [facility].   Body mass index is 37.42 kg/m. Pt meets criteria for obesity based on current BMI.  Current diet order is CHO modified. Labs and medications reviewed. No further nutrition interventions warranted at this time. If additional nutrition issues arise, please re-consult RD.  Tilda Franco, MS, RD, LDN Inpatient Clinical Dietitian Contact information available via Amion

## 2021-05-04 NOTE — Progress Notes (Signed)
Adult Psychoeducational Group Note  Date:  05/04/2021 Time:  10:21 PM  Group Topic/Focus:  Wrap-Up Group:   The focus of this group is to help patients review their daily goal of treatment and discuss progress on daily workbooks.  Participation Level:  Active  Participation Quality:  Appropriate  Affect:  Appropriate  Cognitive:  Appropriate  Insight: Appropriate  Engagement in Group:  Engaged  Modes of Intervention:  Discussion  Additional Comments:  Pt stated his goal for today was to focus on his treatment plan. Pt stated he accomplished his goal today. Pt stated he talked with his doctor and with his social worker about his care today. Pt rated his overall day a 10. Pt stated he made called to his sister today which improved his overall day. Pt stated he felt better about himself today. Pt stated staff brought back all meals for him today because of his blood sugar issues. Pt stated he took all medications provided today. Pt stated his appetite was pretty good today. Pt rated sleep last night was pretty good. Pt stated the goal tonight was to get some rest. Pt stated he had no physical pain tonight. Pt deny visual hallucinations and auditory issues tonight. Pt denies thoughts of harming himself or others. Pt stated he would alert staff if anything changed  [name] 05/04/2021, 10:21 PM

## 2021-05-04 NOTE — Progress Notes (Addendum)
Inpatient Diabetes Program Recommendations  AACE/ADA: New Consensus Statement on Inpatient Glycemic Control (2015)  Target Ranges:  Prepandial:   less than 140 mg/dL      Peak postprandial:   less than 180 mg/dL (1-2 hours)      Critically ill patients:  140 - 180 mg/dL   Lab Results  Component Value Date   GLUCAP 173 (H) 05/04/2021   HGBA1C 9.2 (H) 04/24/2021    Review of Glycemic Control  Latest Reference Range & Units 05/03/21 16:59 05/03/21 19:42 05/04/21 05:30  Glucose-Capillary 70 - 99 mg/dL 259 (H) 563 (H) 875 (H)  (H): Data is abnormally high Current orders for Inpatient glycemic control: 70/30 52 units with breakfast and 35 unit with supper   Inpatient Diabetes Program Recommendations:     Continue further increasing 70/30 to 56 units with breakfast   Will continue to follow while inpatient. Noted consult and secure chat sent to RN & MD regarding insulin teaching and education needs while inpatient, especially since patient on insulin prior to admission.   Patient can obtain insulin at [facility] pharmacy- Relion Novolin 70/30 insulin pens $42 for (5) insulin pens. #[id number]  Thanks, Lujean Rave, MSN, RNC-OB Diabetes Coordinator [phone] (8a-5p)

## 2021-05-04 NOTE — Progress Notes (Addendum)
Pt did well staying on the unit for meals and eating carb modified diet.    Pt's blood sugars have been better regulated.  Sugar at 1700 was 171.  Pt took medications without incident and no adverse reactions were noted.

## 2021-05-04 NOTE — BH Assessment (Signed)
Care Management - Follow Up [facility] Discharges  ° °Patient has been placed in an inpatient psychiatric hospital ([facility]) on 04-23-2021  °

## 2021-05-04 NOTE — Discharge Instructions (Signed)

## 2021-05-04 NOTE — Progress Notes (Signed)
°   05/04/21 2200  Psych Admission Type (Psych Patients Only)  Admission Status Involuntary  Psychosocial Assessment  Patient Complaints Anxiety  Eye Contact Fair  Facial Expression Flat  Affect Preoccupied  Speech Logical/coherent  Interaction Assertive  Motor Activity Other (Comment) (WDL)  Appearance/Hygiene Disheveled  Behavior Characteristics Cooperative  Mood Preoccupied  Thought Process  Coherency Disorganized  Content Magical thinking  Delusions None reported or observed  Perception Hallucinations  Hallucination None reported or observed  Judgment Limited  Confusion Moderate  Danger to Self  Current suicidal ideation? Denies  Danger to Others  Danger to Others None reported or observed

## 2021-05-04 NOTE — BHH Group Notes (Signed)
The focus of this group is to help patients establish daily goals to achieve during treatment and discuss how the patient can incorporate goal setting into their daily lives to aide in recovery.  Mitchell [name]'s goal for today is to stay up and go to groups.

## 2021-05-05 DIAGNOSIS — F250 Schizoaffective disorder, bipolar type: Secondary | ICD-10-CM | POA: Diagnosis not present

## 2021-05-05 LAB — GLUCOSE, CAPILLARY
Glucose-Capillary: 159 mg/dL — ABNORMAL HIGH (ref 70–99)
Glucose-Capillary: 224 mg/dL — ABNORMAL HIGH (ref 70–99)
Glucose-Capillary: 241 mg/dL — ABNORMAL HIGH (ref 70–99)
Glucose-Capillary: 367 mg/dL — ABNORMAL HIGH (ref 70–99)

## 2021-05-05 LAB — CLOZAPINE (CLOZARIL)
Clozapine Lvl: 186 ng/mL — ABNORMAL LOW (ref 350–650)
NorClozapine: 87 ng/mL
Total(Cloz+Norcloz): 273 ng/mL

## 2021-05-05 NOTE — Progress Notes (Signed)
°   05/05/21 2200  Psych Admission Type (Psych Patients Only)  Admission Status Involuntary  Psychosocial Assessment  Patient Complaints None  Eye Contact Fair  Facial Expression Flat  Affect Preoccupied  Speech Logical/coherent  Interaction Assertive  Motor Activity Other (Comment) (WDL)  Appearance/Hygiene Disheveled  Behavior Characteristics Cooperative  Mood Preoccupied  Thought Process  Coherency Disorganized  Content Magical thinking  Delusions None reported or observed  Perception Hallucinations  Hallucination None reported or observed  Judgment Limited  Confusion Moderate  Danger to Self  Current suicidal ideation? Denies  Danger to Others  Danger to Others None reported or observed

## 2021-05-05 NOTE — Plan of Care (Signed)
  Problem: Activity: Goal: Interest or engagement in activities will improve Outcome: Progressing   Problem: Coping: Goal: Ability to verbalize frustrations and anger appropriately will improve Outcome: Progressing   Problem: Coping: Goal: Ability to demonstrate self-control will improve Outcome: Progressing   Problem: Safety: Goal: Periods of time without injury will increase Outcome: Progressing   

## 2021-05-05 NOTE — Progress Notes (Signed)
Adult Psychoeducational Group Note  Date:  05/05/2021 Time:  11:37 PM  Group Topic/Focus:  Wrap-Up Group:   The focus of this group is to help patients review their daily goal of treatment and discuss progress on daily workbooks.  Participation Level:  Active  Participation Quality:  Appropriate  Affect:  Appropriate  Cognitive:  Appropriate  Insight: Appropriate  Engagement in Group:  Engaged  Modes of Intervention:  Discussion  Additional Comments:  Pt stated his goal for today was to focus on his treatment plan. Pt stated he accomplished his goal today. Pt stated he talked with his doctor and with his social worker about his care today. Pt rated his overall day a 10. Pt stated his goal was to keep his blood sugars in the 100's. Pt stated he was able to contact his children today which improved his overall day. Pt stated he felt better about himself today. Pt stated staff brought back all meals for him today because of his blood sugar issues. Pt stated he took all medications provided today. Pt stated his appetite was pretty good today. Pt rated sleep last night was pretty good. Pt stated the goal tonight was to get some rest. Pt stated he had no physical pain tonight. Pt deny visual hallucinations and auditory issues tonight. Pt denies thoughts of harming himself or others. Pt stated he would alert staff if anything changed  Mitchell [name] 05/05/2021, 11:37 PM

## 2021-05-05 NOTE — Progress Notes (Signed)
EKG completed and placed on the front of the chart  

## 2021-05-05 NOTE — Progress Notes (Signed)
°   05/05/21 1200  Psych Admission Type (Psych Patients Only)  Admission Status Involuntary  Psychosocial Assessment  Patient Complaints None  Eye Contact Fair  Facial Expression Flat  Affect Preoccupied  Speech Logical/coherent  Interaction Assertive  Motor Activity Other (Comment) (WDL)  Appearance/Hygiene Disheveled  Behavior Characteristics Cooperative  Mood Pleasant  Thought Process  Coherency Disorganized  Content Magical thinking  Delusions None reported or observed  Perception Hallucinations  Hallucination None reported or observed  Judgment Limited  Confusion Moderate  Danger to Self  Current suicidal ideation? Denies  Danger to Others  Danger to Others None reported or observed

## 2021-05-05 NOTE — Progress Notes (Signed)
°   05/05/21 0545  Sleep  Number of Hours 5.75

## 2021-05-05 NOTE — Group Note (Signed)
Recreation Therapy Group Note   Group Topic:Healthy Support Systems  Group Date: 05/05/2021 Start Time: 1000 End Time: 1020 Facilitators: Caroll Rancher, LRT,CTRS Location: [address] Dayroom   Goal Area(s) Addresses:  Patient will identify members of their support system. Patient will acknowledge benefit of healthy supports in daily life.   Group Description: Social Support.  LRT led a discussion on the different ways support systems offer social support (emotional, tangible, informational and social).  Patients would then identify their three biggest supporters, how they help them and what barriers the patient from using the help their supports offer   Affect/Mood: Drowsy   Participation Level: Minimal   Participation Quality: Maximum Cues   Behavior: Sleepy   Speech/Thought Process: Barely audible  and Slurred   Insight: Impaired   Judgement: Impaired   Modes of Intervention: Worksheet   Patient Response to Interventions:  Sleepy   Education Outcome:  Acknowledges education and In group clarification offered    Clinical Observations/Individualized Feedback:  LRT attempted to go over sheet with pt.  Pt was drowsy and had a hard time staying awake.  When asked questions, pt would attempt to answer but would slur his words and was hard to understand.  Pt eventually fell completely asleep.    Plan: Continue to engage patient in RT group sessions 2-3x/week.   Caroll Rancher, Antonietta Jewel 05/05/2021 12:57 PM

## 2021-05-05 NOTE — BHH Counselor (Signed)
CSW completed an APS report for this patient.      Ruthann Cancer MSW, LCSW Clincal Social Worker  [facility]

## 2021-05-05 NOTE — Progress Notes (Signed)
[facility] MD Progress Note  05/05/2021 5:07 PM Mitchell [name]  MRN:  [id number]  Chief Complaint: delusions and SI  Subjective:   Mitchell [name] is a 61 y.o. male with PPHx of Schizophrenia and multiple hospitalizations, who presented to [facility] via IVC for delusion that he is [name] and threatening to jump into a trash compactor, then admitted Involuntary to [facility] for treatment of schizophrenia exacerbated by medication selective adherence.   Patient was evaluated inpatient room on [place].  Patient was initially seen sleeping in bed and was drowsy throughout evaluation. Patient reported fixed delusion that he is "[name]", he did appear to have improved insight saying that he is currently in a psych ward.  However stated that he was brought here for "no reason". He is alert and oriented, reported that he is in a psych ward, February 2023, [place], [place], [place].  In response to the current president, he reported "I am" and reported that he is Obama.  Patient denied daytime drowsiness, as he is nodding off during conversation.  He reported stable sleep, appetite, mood, reporting that it is "great".  He reported having a bowel movement this a.m., although it is still constipated. Patient denied SI/HI/AVH, paranoia, first rank symptoms, and contracted to safety on the unit. Patient was not grossly responding to internal/external stimuli. Review of systems as below.   Principal Problem: Schizophrenia ([place]) Diagnosis: Principal Problem:   Schizophrenia ([place]) Active Problems:   Hypertension   Type 2 diabetes mellitus ([place])  Total Time Spent in Direct Patient Care: I personally spent 30 minutes on the unit in direct patient care. The direct patient care time included face-to-face time with the patient, reviewing the patient's chart, communicating with other professionals, and coordinating care. Greater than 50% of this time was spent in  counseling or coordinating care with the patient regarding goals of hospitalization, psycho-education, and discharge planning needs.   Past Psychiatric History: Past psych diagnoses: Schizophrenia Prior inpatient treatment: [place] ~14x (latest 01/28/2021), [facility] ~2020 Suicide attempts:  Psychiatric med trials: Zyprexa, loxapine, paliperidone injection/156 mg, Geodon Neuromodulation history: N/A Current outpatient psychiatrist:  Current outpatient therapist:  History of selective adherence: Yes.    Past Medical History:  Past Medical History:  Diagnosis Date   Bipolar affective disorder ([place])    takes Zyprexa daily   Coarse tremors 10/02/2014   Diabetes mellitus    takes Victoza,Metformin,and Glipizide daily   Hypertension    takes Amlodipine,Lisinopril and Clonidine daily   Hyponatremia    history of   Lithium toxicity 10/02/2014   Mental disorder    takes Lithium daily   Schizoaffective disorder    takes Trazodone nightly   Schizoaffective disorder ([place]) 01/04/2019   Seasonal allergies    takes Claritin daily   Sleep apnea    sleep study >19yr ago   Stroke ([facility]    left arm weakness    Past Surgical History:  Procedure Laterality Date   CATARACT EXTRACTION W/PHACO Right 02/14/2013   Procedure: CATARACT EXTRACTION PHACO AND INTRAOCULAR LENS PLACEMENT ([place];  Surgeon: GAdonis Brook MD;  [place]  Service: Ophthalmology;  Laterality: Right;   CATARACT EXTRACTION W/PHACO Left 06/13/2013   Procedure: CATARACT EXTRACTION PHACO AND INTRAOCULAR LENS PLACEMENT (IOC);  Surgeon: GAdonis Brook MD;  [place]  Service: Ophthalmology;  Laterality: Left;   CIRCUMCISION  20 yrs. ago   [facility]  Family Psychiatric History: unable to obtain  Social History:  Social History   Substance and Sexual Activity  Alcohol Use Not Currently     Social History   Substance and Sexual Activity  Drug Use No    Social History   Socioeconomic  History   Marital status: Divorced    Spouse name: Not on file   Number of children: Not on file   Years of education: Not on file   Highest education level: Not on file  Occupational History   Not on file  Tobacco Use   Smoking status: Never   Smokeless tobacco: Never  Vaping Use   Vaping Use: Never used  Substance and Sexual Activity   Alcohol use: Not Currently   Drug use: No   Sexual activity: Yes    Birth control/protection: None  Other Topics Concern   Not on file  Social History Narrative   Not on file   Social Determinants of Health   Financial Resource Strain: Not on file  Food Insecurity: Not on file  Transportation Needs: Not on file  Physical Activity: Not on file  Stress: Not on file  Social Connections: Not on file   Sleep: Good  Appetite: Good  Current Medications: Current Facility-Administered Medications  Medication Dose Route Frequency Provider Last Rate Last Admin   acetaminophen (TYLENOL) tablet 650 mg  650 mg Oral Q6H PRN Lucky Rathke, FNP       alum & mag hydroxide-simeth (MAALOX/MYLANTA) 200-200-20 MG/5ML suspension 30 mL  30 mL Oral Q4H PRN Lucky Rathke, FNP       amLODipine (NORVASC) tablet 10 mg  10 mg Oral QHS Merrily Brittle, DO   10 mg at 05/04/21 2048   aspirin EC tablet 81 mg  81 mg Oral Daily Lucky Rathke, FNP   81 mg at 05/05/21 0810   atenolol (TENORMIN) tablet 12.5 mg  12.5 mg Oral Daily Lucky Rathke, FNP   12.5 mg at 05/05/21 0810   cloNIDine (CATAPRES) tablet 0.2 mg  0.2 mg Oral BID Lucky Rathke, FNP   0.2 mg at 05/05/21 1643   cloZAPine (CLOZARIL) tablet 100 mg  100 mg Oral QHS Nelda Marseille, Amy E, MD   100 mg at 05/04/21 2048   cloZAPine (CLOZARIL) tablet 50 mg  50 mg Oral Daily Viann Fish E, MD   50 mg at 05/05/21 0809   haloperidol (HALDOL) tablet 15 mg  15 mg Oral QHS Nelda Marseille, Amy E, MD   15 mg at 05/04/21 2048   haloperidol (HALDOL) tablet 5 mg  5 mg Oral Daily Nelda Marseille, Amy E, MD   5 mg at 05/05/21 0810   insulin  aspart (novoLOG) injection 0-15 Units  0-15 Units Subcutaneous TID WC Merrily Brittle, DO   5 Units at 05/05/21 1218   insulin aspart (novoLOG) injection 0-5 Units  0-5 Units Subcutaneous QHS Merrily Brittle, DO   3 Units at 05/04/21 2048   insulin aspart protamine- aspart (NOVOLOG MIX 70/30) injection 35 Units  35 Units Subcutaneous Q supper Merrily Brittle, DO   35 Units at 05/04/21 1736   insulin aspart protamine- aspart (NOVOLOG MIX 70/30) injection 52 Units  52 Units Subcutaneous Q breakfast Merrily Brittle, DO   52 Units at 05/05/21 0817   lisinopril (ZESTRIL) tablet 20 mg  20 mg Oral QHS Nelda Marseille, Amy E, MD   20 mg at 05/04/21 2048   lithium carbonate (ESKALITH) CR tablet 450 mg  450 mg Oral Q12H Beatriz Stallion  L, FNP   450 mg at 05/05/21 0810   OLANZapine zydis (ZYPREXA) disintegrating tablet 10 mg  10 mg Oral Q8H PRN Corky Sox, MD       And   LORazepam (ATIVAN) tablet 1 mg  1 mg Oral PRN Corky Sox, MD       And   ziprasidone (GEODON) injection 20 mg  20 mg Intramuscular PRN Corky Sox, MD       magnesium hydroxide (MILK OF MAGNESIA) suspension 30 mL  30 mL Oral Daily PRN Lucky Rathke, FNP       senna (SENOKOT) tablet 17.2 mg  2 tablet Oral Daily Merrily Brittle, DO   17.2 mg at 05/05/21 0809   traZODone (DESYREL) tablet 50 mg  50 mg Oral QHS PRN Lucky Rathke, FNP   50 mg at 05/04/21 2048    Lab Results:  Results for orders placed or performed during the hospital encounter of 04/23/21 (from the past 48 hour(s))  Clozapine (clozaril)     Status: Abnormal   Collection Time: 05/03/21  6:46 PM  Result Value Ref Range   Clozapine Lvl 186 (L) 350 - 650 ng/mL    Comment: (NOTE) This test was developed and its performance characteristics determined by [facility]. It has not been cleared or approved by the Food and Drug Administration.    NorClozapine 87 Not Estab. ng/mL    Comment: (NOTE) This test was developed and its performance characteristics determined by [facility]. It has not  been cleared or approved by the Food and Drug Administration.    Total(Cloz+Norcloz) 273 ng/mL    Comment: (NOTE) Patients dosed with 400 mg clozapine daily for 4 weeks were most likely to exhibit a therapeutic effect when the sum of clozapine and norclozapine concentrations were at least 450 ng/mL. Vira Agar, et al. Rexford Maus Consensus Guidelines for Therapeutic Drug Monitoring in Psychiatry: Update 2011, Pharmacopsychiatry Sep 2011; 44(6):195-235.                                Detection Limit = 20 Performed At: [facility] [address] [place], [place] [id number] Rush Farmer MD VO:[id number]   Glucose, capillary     Status: Abnormal   Collection Time: 05/03/21  7:42 PM  Result Value Ref Range   Glucose-Capillary 127 (H) 70 - 99 mg/dL    Comment: Glucose reference range applies only to samples taken after fasting for at least 8 hours.  Glucose, capillary     Status: Abnormal   Collection Time: 05/04/21  5:30 AM  Result Value Ref Range   Glucose-Capillary 173 (H) 70 - 99 mg/dL    Comment: Glucose reference range applies only to samples taken after fasting for at least 8 hours.  Glucose, capillary     Status: Abnormal   Collection Time: 05/04/21 11:57 AM  Result Value Ref Range   Glucose-Capillary 207 (H) 70 - 99 mg/dL    Comment: Glucose reference range applies only to samples taken after fasting for at least 8 hours.  Glucose, capillary     Status: Abnormal   Collection Time: 05/04/21  4:54 PM  Result Value Ref Range   Glucose-Capillary 171 (H) 70 - 99 mg/dL    Comment: Glucose reference range applies only to samples taken after fasting for at least 8 hours.  Glucose, capillary     Status: Abnormal   Collection Time: 05/04/21  7:41 PM  Result Value Ref Range   Glucose-Capillary 275 (H) 70 - 99 mg/dL    Comment: Glucose reference range applies only to samples taken after fasting for at least 8 hours.  Glucose, capillary     Status: Abnormal    Collection Time: 05/05/21  5:44 AM  Result Value Ref Range   Glucose-Capillary 159 (H) 70 - 99 mg/dL    Comment: Glucose reference range applies only to samples taken after fasting for at least 8 hours.  Glucose, capillary     Status: Abnormal   Collection Time: 05/05/21 12:09 PM  Result Value Ref Range   Glucose-Capillary 224 (H) 70 - 99 mg/dL    Comment: Glucose reference range applies only to samples taken after fasting for at least 8 hours.  Glucose, capillary     Status: Abnormal   Collection Time: 05/05/21  4:48 PM  Result Value Ref Range   Glucose-Capillary 241 (H) 70 - 99 mg/dL    Comment: Glucose reference range applies only to samples taken after fasting for at least 8 hours.    Blood Alcohol level:  Lab Results  Component Value Date   ETH <10 04/16/2021   ETH <10 92/03/69    Metabolic Disorder Labs: Lab Results  Component Value Date   HGBA1C 9.2 (H) 04/24/2021   MPG 217.34 04/24/2021   MPG 271.87 01/26/2021   No results found for: PROLACTIN Lab Results  Component Value Date   CHOL 140 01/30/2021   TRIG 95 01/30/2021   HDL 49 01/30/2021   CHOLHDL 2.9 01/30/2021   VLDL 19 01/30/2021   LDLCALC 72 01/30/2021   LDLCALC 67 05/20/2020   Psychiatric Specialty Exam: Physical Exam Vitals and nursing note reviewed.  HENT:     Head: Normocephalic.  Pulmonary:     Effort: No respiratory distress.  Neurological:     General: No focal deficit present.     Mental Status: He is alert.     Gait: Gait normal.  Psychiatric:        Behavior: Behavior is cooperative.    Review of Systems  Respiratory:  Negative for shortness of breath.   Cardiovascular:  Negative for chest pain and palpitations.  Gastrointestinal:  Positive for constipation (still having daily bowel movements). Negative for abdominal pain, nausea and vomiting.  Neurological:  Negative for tremors and headaches.    Body mass index is 37.42 kg/m. Temp:  [98 F (36.7 C)] 98 F (36.7 C) (02/07  0627) Pulse Rate:  [93-98] 93 (02/07 1621) BP: (120-151)/(72-88) 151/88 (02/07 1621) SpO2:  [100 %] 100 % (02/07 0627)  General Appearance:  Older male, casual dress, same pink button up shirt x3 days, drowsy   Eye Contact:   Fair    Speech:   Mumbling quality at times but coherent, regular rate    Volume:   Normal    Mood:   "Great", dysphoric    Affect:   Constricted, depressed, sleeping    Thought Process:   Concrete, linear   Duration of Psychotic Symptoms:  Greater than six months  Past Diagnosis of Schizophrenia or Psychoactive disorder:  Yes   Orientation:   Alert and oriented to location, city, state, year, month, county.  Not oriented to current president or situation    Thought Content:  Illogical - Has delusions that he is [name], that he is the current president, that he is rich; denies AVH, ideas of reference, or first rank symptoms; is not grossly responding to internal/external stimuli on exam,  appears paranoid at times on exam with questioning  Ideas of reference: None    Suicidal Thoughts:   No    Homicidal Thoughts:   No    Memory:   Limited     Judgement:   Impaired    Insight:   Lacking    Psychomotor Activity:   No tremor, steady gait    Concentration:   Fair    Attention Span:   Fair   Recall:   Poor    Fund of Knowledge:   Limited    Language:   Good    Handed:   Right    Assets:   [place]    Sleep:   5.75 hours   AIMS: Facial and Oral Movements Muscles of Facial Expression: None, normal Lips and Perioral Area: None, normal Jaw: None, normal Tongue: None, normal,Extremity Movements Upper (arms, wrists, hands, fingers): None, normal Lower (legs, knees, ankles, toes): None, normal, Trunk Movements Neck, shoulders, hips: None, normal, Overall Severity Severity of abnormal movements (highest score from questions above): None, normal Incapacitation due to abnormal movements: None, normal Patient's  awareness of abnormal movements (rate only patient's report): No Awareness, Dental Status Current problems with teeth and/or dentures?: No Does patient usually wear dentures?: No   MOCA: 13/30  MMSE: 14/30 Pillbox test: Unable to complete     Treatment Plan Summary: Daily contact with patient to assess and evaluate symptoms and progress in treatment and Medication management  Safety and Monitoring -- Involuntary admission to inpatient psychiatric unit for safety, stabilization and treatment -- Daily contact with patient to assess and evaluate symptoms and progress in treatment -- Patient's case to be discussed in multi-disciplinary team meeting -- Observation Level : q15 minute checks -- Vital signs:  q12 hours -- Precautions: suicide  Treatment Assessment & Plan Summary: Principal Problem:   Schizophrenia ([place]) Active Problems:   Hypertension   Type 2 diabetes mellitus ([place])   Schizophrenia by hx -ACTT referral pending -Continue Haldol 5 mg qAM & 15 mg qPM due daytime sedation - goal to titrate down on Haldol over time as he stabilizes on Clozaril -Continued Clozaril 50 mg qAM & 100 mg qPM (first dose of 100 mg 2/3) - WBC 6.0 and ANC 3200 on 2/3, Troponin 3 on 05/01/21, CRP 1.2 on 05/01/21 - repeat labs due 05/08/21 - Clozaril level ordered and pending - QTC 480m on 1/31 - EKG re-ordered when he will cooperate for testing - A1c 9.2 and lipids WNL in 11/22 - Increase senokot to 2 tabs daily for Clozaril-induced constipation  -Continued Lithium 450 mg BID for mood stability - Li level 0.67 2/3 - Cr 1.09 - trending closely while on Li and encouraging po hydration - TSH WNL   T2DM with insulin dependence A1C of 9.2. Diabetic coordinator on-board for insulin management -Continued insulin per diabetic coordinator with increase in 70/30 to 52u with breakfast and continue 70/30 35u with supper -Continued SSI moderate -Continued HS coverage -Placed on unit restriction for meals to  decrease insulin burden and dietary/nutrition consult ordered - Attempting to see if patient can administer own insulin with teaching by nursing   HTN Continue home regimen  -Lisinopril 277mdaily (reduced due to concern for creatinine) -Amlodipine 10 mg daily -Clonidine 0.2 mg BID -Atenolol 12.29m92maily   Elevated transaminases, stable Repeat 05/01/21: AST 82 and ALT 136; Alk phos 139 - Med consulted, stated that no further work up necessary - Hepatitis panel nonreactive, ammonia 35, negative RUQ u/s 02/10/21 -  Will need outpatient f/u after discharge - Trending labs per above  Cognitive impairment MOCA: 13/30  MMSE: 14/30 Pillbox test: Unable to complete, per OT patient is not able to manage his own medication in the next level of care. Patient will need 24/7 caregiver support to assist with IADLs and medication management to be successful in next level of care Concern for ability to live independently and to complete IADLs, currently patient refuses to live in assisted living. Follow-up APS report per CSW for potential guardianship Follow-up OT consult and eval for SLUMS  Follow-up diabetic coordinator for education of insulin pen administration after OT eval  DISPO: Pending ACTT interview/APS report   Signed: Merrily Brittle, DO Psychiatry Resident, PGY-1 [facility] 05/05/2021, 5:07 PM

## 2021-05-06 LAB — GLUCOSE, CAPILLARY
Glucose-Capillary: 188 mg/dL — ABNORMAL HIGH (ref 70–99)
Glucose-Capillary: 228 mg/dL — ABNORMAL HIGH (ref 70–99)
Glucose-Capillary: 191 mg/dL — ABNORMAL HIGH (ref 70–99)

## 2021-05-06 MED ORDER — DONEPEZIL HCL 5 MG PO TABS
5.0000 mg | ORAL_TABLET | Freq: Every day | ORAL | Status: DC
  Administered 2021-05-06 – 2021-05-26 (×21): 5 mg via ORAL
  Filled 2021-05-06 (×22): qty 1

## 2021-05-06 NOTE — B[facility] Group Notes (Signed)
Adult Psychoeducational Group Note  Date:  05/06/2021 Time:  8:54 PM  Group Topic/Focus:  Wrap-Up Group:   The focus of this group is to help patients review their daily goal of treatment and discuss progress on daily workbooks.  Participation Level:  Active  Participation Quality:  Appropriate and Attentive  Affect:  Appropriate  Cognitive:  Alert and Appropriate  Insight: Appropriate and Good  Engagement in Group:  Engaged  Modes of Intervention:  Discussion and Education  Additional Comments:  Pt attended and participated in wrap up group this evening and rated their day a 10/10. Pt stated that they have been "writing songs" and have attended two groups today. Pt stated that their goal is to continue to manage their blood sugar and states that they have been doing better at managing it. They have an ongoing goal to continue to program and go to groups.    Chrisandra Netters 05/06/2021, 8:54 PM

## 2021-05-06 NOTE — Group Note (Signed)
Recreation Therapy Group Note   Group Topic:Problem Solving  Group Date: 05/06/2021 Start Time: 1006 End Time: 1040 Facilitators: Caroll Rancher, LRT,CTRS Location: [address] Dayroom   Goal Area(s) Addresses:  Patient will effectively work with peer towards shared goal.  Patient will identify factors that guided their decision making.  Patient will pro-socially communicate ideas during group session.   Group Description:  Patients were given a scenario that they were going to be stranded on a deserted [place] for several months before being rescued. Writer tasked them with making a list of 15 things they would choose to bring with them for "survival". The list of items was prioritized most important to least. Each patient would come up with their own list, then work together to create a new list of 15 items while in a group of 3-5 peers. LRT discussed each person's list and how it differed from others. The debrief included discussion of priorities, good decisions versus bad decisions, and how it is important to think before acting so we can make the best decision possible. LRT tied the concept of effective communication among group members to patient's support systems outside of the hospital and its benefit post discharge.   Affect/Mood: N/A   Participation Level: Did not attend    Clinical Observations/Individualized Feedback:      Plan: Continue to engage patient in RT group sessions 2-3x/week.   Caroll Rancher, LRT,CTRS 05/06/2021 11:39 AM

## 2021-05-06 NOTE — Group Note (Signed)
LCSW Group Therapy Note   Group Date: 05/06/2021 Start Time: 1300 End Time: 1400    Type of Therapy and Topic:  Group Therapy:  Strengths Exploration   Participation Level: Did Not Attend  Description of Group: This group allows individuals to explore their strengths, learn to use strengths in new ways to improve well-being. Strengths-based interventions involve identifying strengths, understanding how they are used, and learning new ways to apply them. Individuals will identify their strengths, and then explore their roles in different areas of life (relationships, professional life, and personal fulfillment). Individuals will think about ways in which they currently use their strengths, along with new ways they could begin using them.    Therapeutic Goals Patient will verbalize two of their strengths Patient will identify how their strengths are currently used Patient will identify two new ways to apply their strengths  Patients will create a plan to apply their strengths in their daily lives     Summary of Patient Progress:  Pt was invited however, did not attend     Therapeutic Modalities Cognitive [place] MSW, [facility]

## 2021-05-06 NOTE — Progress Notes (Signed)
°   05/06/21 2100  Psych Admission Type (Psych Patients Only)  Admission Status Involuntary  Psychosocial Assessment  Patient Complaints None  Eye Contact Fair  Facial Expression Flat  Affect Preoccupied  Speech Logical/coherent  Interaction Assertive  Motor Activity Other (Comment) (WDL)  Appearance/Hygiene Disheveled  Behavior Characteristics Cooperative  Mood Pleasant;Preoccupied  Aggressive Behavior  Effect No apparent injury  Thought Process  Coherency Disorganized  Content Magical thinking  Delusions None reported or observed  Perception Hallucinations  Hallucination None reported or observed  Judgment Limited  Confusion Moderate  Danger to Self  Current suicidal ideation? Denies  Danger to Others  Danger to Others None reported or observed

## 2021-05-06 NOTE — B[facility] Group Notes (Signed)
Pt did not attend psychoeducational group. 

## 2021-05-06 NOTE — Progress Notes (Signed)
[facility] MD Progress Note  05/06/2021 6:50 PM Mitchell [name]  MRN:  [id number]  Chief Complaint: delusions and SI  Subjective:   Mitchell [name] is a 61 y.o. male with PPHx of Schizophrenia and multiple hospitalizations, who presented to [facility] via IVC for delusion that he is Mitchell [name] and Mitchell [name] and threatening to jump into a trash compactor, then admitted Involuntary to [facility] for treatment of schizophrenia exacerbated by medication selective adherence.   Patient was evaluated in patient room on [address]. Patient was not irritable and was aloof with interview.  Patient continued to endorse delusions of being "Mitchell [name]" but was A&O to month, year, location, city, state. He stated that he is the president. Patient's affect is incongruent with his stated mood and appeared blunted and depressed. He reported stable sleep and appetite. Patient denied SI/HI/AVH, delusions, paranoia, first rank symptoms, and contracted to safety on the unit. Patient was not grossly responding to internal/external stimuli nor made any delusional statements during encounter.    Principal Problem: Schizophrenia ([place]) Diagnosis: Principal Problem:   Schizophrenia ([place]) Active Problems:   Hypertension   Type 2 diabetes mellitus ([place])  Total Time Spent in Direct Patient Care: I personally spent 30 minutes on the unit in direct patient care. The direct patient care time included face-to-face time with the patient, reviewing the patient's chart, communicating with other professionals, and coordinating care. Greater than 50% of this time was spent in counseling or coordinating care with the patient regarding goals of hospitalization, psycho-education, and discharge planning needs.   Past Psychiatric History: Past psych diagnoses: Schizophrenia Prior inpatient treatment: [facility] ~14x (latest 01/28/2021), [facility] ~2020 Suicide attempts:  Psychiatric med trials: Zyprexa, loxapine, paliperidone  injection/156 mg, Geodon Neuromodulation history: N/A Current outpatient psychiatrist:  Current outpatient therapist:  History of selective adherence: Yes.    Past Medical History:  Past Medical History:  Diagnosis Date   Bipolar affective disorder ([place])    takes Zyprexa daily   Coarse tremors 10/02/2014   Diabetes mellitus    takes Victoza,Metformin,and Glipizide daily   Hypertension    takes Amlodipine,Lisinopril and Clonidine daily   Hyponatremia    history of   Lithium toxicity 10/02/2014   Mental disorder    takes Lithium daily   Schizoaffective disorder    takes Trazodone nightly   Schizoaffective disorder ([place]) 01/04/2019   Seasonal allergies    takes Claritin daily   Sleep apnea    sleep study >69yrs ago   Stroke [facility])    left arm weakness    Past Surgical History:  Procedure Laterality Date   CATARACT EXTRACTION W/PHACO Right 02/14/2013   Procedure: CATARACT EXTRACTION PHACO AND INTRAOCULAR LENS PLACEMENT ([place]);  Surgeon: Adonis Brook, MD;  Location: [place];  Service: Ophthalmology;  Laterality: Right;   CATARACT EXTRACTION W/PHACO Left 06/13/2013   Procedure: CATARACT EXTRACTION PHACO AND INTRAOCULAR LENS PLACEMENT (IOC);  Surgeon: Adonis Brook, MD;  Location: [place];  Service: Ophthalmology;  Laterality: Left;   CIRCUMCISION  20 yrs. ago   EYE SURGERY     Family History:see H&P  Family Psychiatric History: unable to obtain  Social History:  Social History   Substance and Sexual Activity  Alcohol Use Not Currently     Social History   Substance and Sexual Activity  Drug Use No    Social History   Socioeconomic History   Marital status: Divorced    Spouse name: Not on file   Number of  children: Not on file   Years of education: Not on file   Highest education level: Not on file  Occupational History   Not on file  Tobacco Use   Smoking status: Never   Smokeless tobacco: Never  Vaping Use   Vaping Use: Never used  Substance and Sexual Activity    Alcohol use: Not Currently   Drug use: No   Sexual activity: Yes    Birth control/protection: None  Other Topics Concern   Not on file  Social History Narrative   Not on file   Social Determinants of Health   Financial Resource Strain: Not on file  Food Insecurity: Not on file  Transportation Needs: Not on file  Physical Activity: Not on file  Stress: Not on file  Social Connections: Not on file   Sleep: Good  Appetite: Good  Current Medications: Current Facility-Administered Medications  Medication Dose Route Frequency Provider Last Rate Last Admin   acetaminophen (TYLENOL) tablet 650 mg  650 mg Oral Q6H PRN Lucky Rathke, FNP       alum & mag hydroxide-simeth (MAALOX/MYLANTA) 200-200-20 MG/5ML suspension 30 mL  30 mL Oral Q4H PRN Lucky Rathke, FNP       amLODipine (NORVASC) tablet 10 mg  10 mg Oral QHS Merrily Brittle, DO   10 mg at 05/05/21 2039   aspirin EC tablet 81 mg  81 mg Oral Daily Lucky Rathke, FNP   81 mg at 05/06/21 0815   atenolol (TENORMIN) tablet 12.5 mg  12.5 mg Oral Daily Lucky Rathke, FNP   12.5 mg at 05/06/21 0815   cloNIDine (CATAPRES) tablet 0.2 mg  0.2 mg Oral BID Lucky Rathke, FNP   0.2 mg at 05/06/21 1641   cloZAPine (CLOZARIL) tablet 100 mg  100 mg Oral QHS Nelda Marseille, Amy E, MD   100 mg at 05/05/21 2039   cloZAPine (CLOZARIL) tablet 50 mg  50 mg Oral Daily Nelda Marseille, Amy E, MD   50 mg at 05/06/21 0815   donepezil (ARICEPT) tablet 5 mg  5 mg Oral QHS Merrily Brittle, DO       haloperidol (HALDOL) tablet 15 mg  15 mg Oral QHS Nelda Marseille, Amy E, MD   15 mg at 05/05/21 2039   haloperidol (HALDOL) tablet 5 mg  5 mg Oral Daily Viann Fish E, MD   5 mg at 05/06/21 0816   insulin aspart (novoLOG) injection 0-15 Units  0-15 Units Subcutaneous TID WC Merrily Brittle, DO   3 Units at 05/06/21 1644   insulin aspart (novoLOG) injection 0-5 Units  0-5 Units Subcutaneous QHS Merrily Brittle, DO   5 Units at 05/05/21 2041   insulin aspart protamine- aspart (NOVOLOG MIX  70/30) injection 35 Units  35 Units Subcutaneous Q supper Merrily Brittle, DO   35 Units at 05/06/21 1645   insulin aspart protamine- aspart (NOVOLOG MIX 70/30) injection 52 Units  52 Units Subcutaneous Q breakfast Merrily Brittle, DO   52 Units at 05/06/21 0818   lisinopril (ZESTRIL) tablet 20 mg  20 mg Oral QHS Nelda Marseille, Amy E, MD   20 mg at 05/05/21 2039   lithium carbonate (ESKALITH) CR tablet 450 mg  450 mg Oral Q12H Lucky Rathke, FNP   450 mg at 05/06/21 0815   OLANZapine zydis (ZYPREXA) disintegrating tablet 10 mg  10 mg Oral Q8H PRN Corky Sox, MD       And   LORazepam (ATIVAN) tablet 1 mg  1 mg Oral PRN  Corky Sox, MD       And   ziprasidone (GEODON) injection 20 mg  20 mg Intramuscular PRN Corky Sox, MD       magnesium hydroxide (MILK OF MAGNESIA) suspension 30 mL  30 mL Oral Daily PRN Lucky Rathke, FNP       senna (SENOKOT) tablet 17.2 mg  2 tablet Oral Daily Merrily Brittle, DO   17.2 mg at 05/06/21 7564   traZODone (DESYREL) tablet 50 mg  50 mg Oral QHS PRN Lucky Rathke, FNP   50 mg at 05/05/21 2039    Lab Results:  Results for orders placed or performed during the hospital encounter of 04/23/21 (from the past 48 hour(s))  Glucose, capillary     Status: Abnormal   Collection Time: 05/04/21  7:41 PM  Result Value Ref Range   Glucose-Capillary 275 (H) 70 - 99 mg/dL    Comment: Glucose reference range applies only to samples taken after fasting for at least 8 hours.  Glucose, capillary     Status: Abnormal   Collection Time: 05/05/21  5:44 AM  Result Value Ref Range   Glucose-Capillary 159 (H) 70 - 99 mg/dL    Comment: Glucose reference range applies only to samples taken after fasting for at least 8 hours.  Glucose, capillary     Status: Abnormal   Collection Time: 05/05/21 12:09 PM  Result Value Ref Range   Glucose-Capillary 224 (H) 70 - 99 mg/dL    Comment: Glucose reference range applies only to samples taken after fasting for at least 8 hours.  Glucose,  capillary     Status: Abnormal   Collection Time: 05/05/21  4:48 PM  Result Value Ref Range   Glucose-Capillary 241 (H) 70 - 99 mg/dL    Comment: Glucose reference range applies only to samples taken after fasting for at least 8 hours.  Glucose, capillary     Status: Abnormal   Collection Time: 05/05/21  7:51 PM  Result Value Ref Range   Glucose-Capillary 367 (H) 70 - 99 mg/dL    Comment: Glucose reference range applies only to samples taken after fasting for at least 8 hours.  Glucose, capillary     Status: Abnormal   Collection Time: 05/06/21  5:34 AM  Result Value Ref Range   Glucose-Capillary 188 (H) 70 - 99 mg/dL    Comment: Glucose reference range applies only to samples taken after fasting for at least 8 hours.  Glucose, capillary     Status: Abnormal   Collection Time: 05/06/21 11:46 AM  Result Value Ref Range   Glucose-Capillary 228 (H) 70 - 99 mg/dL    Comment: Glucose reference range applies only to samples taken after fasting for at least 8 hours.  Glucose, capillary     Status: Abnormal   Collection Time: 05/06/21  4:40 PM  Result Value Ref Range   Glucose-Capillary 191 (H) 70 - 99 mg/dL    Comment: Glucose reference range applies only to samples taken after fasting for at least 8 hours.    Blood Alcohol level:  Lab Results  Component Value Date   ETH <10 04/16/2021   ETH <10 33/29/5188    Metabolic Disorder Labs: Lab Results  Component Value Date   HGBA1C 9.2 (H) 04/24/2021   MPG 217.34 04/24/2021   MPG 271.87 01/26/2021   No results found for: PROLACTIN Lab Results  Component Value Date   CHOL 140 01/30/2021   TRIG 95 01/30/2021   HDL 49 01/30/2021  CHOLHDL 2.9 01/30/2021   VLDL 19 01/30/2021   LDLCALC 72 01/30/2021   LDLCALC 67 05/20/2020   Psychiatric Specialty Exam: Physical Exam Vitals and nursing note reviewed.  HENT:     Head: Normocephalic.  Pulmonary:     Effort: No respiratory distress.  Neurological:     General: No focal deficit  present.     Mental Status: He is alert.     Gait: Gait normal.  Psychiatric:        Behavior: Behavior is cooperative.    Review of Systems  Respiratory:  Negative for shortness of breath.   Cardiovascular:  Negative for chest pain and palpitations.  Gastrointestinal:  Positive for constipation (still having daily bowel movements). Negative for abdominal pain, nausea and vomiting.  Neurological:  Negative for tremors and headaches.    Body mass index is 37.42 kg/m. Temp:  [98.7 F (37.1 C)] 98.7 F (37.1 C) (02/08 0621) Pulse Rate:  [82-102] 87 (02/08 1612) BP: (109-159)/(66-99) 159/99 (02/08 1612) SpO2:  [99 %-100 %] 99 % (02/08 0621)  General Appearance:  Older male, same pink button up shirt with new coffee stain, more alert   Eye Contact:   Fair    Speech:   Mumbling quality at times but coherent, regular rate    Volume:   Normal    Mood:   "Great" - incongruent to affect    Affect:   Blunted, depressed    Thought Process:   Concrete, linear   Duration of Psychotic Symptoms:  Greater than six months  Past Diagnosis of Schizophrenia or Psychoactive disorder:  Yes   Orientation:   Alert and oriented to location, city, state, year, month, county.  Not oriented to current president or situation    Thought Content:  Illogical - Has delusions that he is [name], that he is the current president, that he is rich; denies AVH, ideas of reference, or first rank symptoms; is not grossly responding to internal/external stimuli on exam, appears paranoid at times on exam with questioning  Ideas of reference: None    Suicidal Thoughts:   No    Homicidal Thoughts:   No    Memory:   Limited     Judgement:   Impaired    Insight:   Lacking    Psychomotor Activity:   No tremor, steady gait    Concentration:   Fair    Attention Span:   Fair   Recall:   Poor    Fund of Knowledge:   Limited    Language:   Good    Handed:   Right    Assets:    Leisure Time    Sleep:   5 hours   AIMS: Facial and Oral Movements Muscles of Facial Expression: None, normal Lips and Perioral Area: None, normal Jaw: None, normal Tongue: None, normal,Extremity Movements Upper (arms, wrists, hands, fingers): None, normal Lower (legs, knees, ankles, toes): None, normal, Trunk Movements Neck, shoulders, hips: None, normal, Overall Severity Severity of abnormal movements (highest score from questions above): None, normal Incapacitation due to abnormal movements: None, normal Patient's awareness of abnormal movements (rate only patient's report): No Awareness, Dental Status Current problems with teeth and/or dentures?: No Does patient usually wear dentures?: No   MOCA: 13/30  MMSE: 14/30 Pillbox test: Unable to complete     Treatment Plan Summary: Daily contact with patient to assess and evaluate symptoms and progress in treatment and Medication management  Safety and Monitoring -- Involuntary admission to  inpatient psychiatric unit for safety, stabilization and treatment -- Daily contact with patient to assess and evaluate symptoms and progress in treatment -- Patient's case to be discussed in multi-disciplinary team meeting -- Observation Level : q15 minute checks -- Vital signs:  q12 hours -- Precautions: suicide  Treatment Assessment & Plan Summary: Principal Problem:   Schizophrenia ([place]) Active Problems:   Hypertension   Type 2 diabetes mellitus ([place])   Schizophrenia by hx -ACTT referral pending -Continue Haldol 5 mg qAM & 15 mg qPM due daytime sedation - goal to titrate down on Haldol over time as he stabilizes on Clozaril -Continued Clozaril 50 mg qAM & 100 mg qPM (first dose of 100 mg 2/3) - WBC 6.0 and ANC 3200 on 2/3, Troponin 3 on 05/01/21, CRP 1.2 on 05/01/21 - repeat labs due 05/08/21 - Clozaril level ordered and pending - QTC 483m on 1/31 - EKG re-ordered when he will cooperate for testing - A1c 9.2 and lipids WNL in  11/22 - Increase senokot to 2 tabs daily for Clozaril-induced constipation  -Continued Lithium 450 mg BID for mood stability - Li level 0.67 2/3 - Cr 1.09 - trending closely while on Li and encouraging po hydration - TSH WNL   T2DM with insulin dependence A1C of 9.2. Diabetic coordinator on-board for insulin management -Continued insulin per diabetic coordinator with increase in 70/30 to 52u with breakfast and continue 70/30 35u with supper -Continued SSI moderate -Continued HS coverage -Placed on unit restriction for meals to decrease insulin burden and dietary/nutrition consult ordered - Attempting to see if patient can administer own insulin with teaching by nursing   HTN Continue home regimen  -Lisinopril 254mdaily (reduced due to concern for creatinine) -Amlodipine 10 mg daily -Clonidine 0.2 mg BID -Atenolol 12.77m42maily   Elevated transaminases, stable Repeat 05/01/21: AST 82 and ALT 136; Alk phos 139 - Med consulted, stated that no further work up necessary - Hepatitis panel nonreactive, ammonia 35, negative RUQ u/s 02/10/21 - Will need outpatient f/u after discharge - Trending labs per above  Cognitive impairment MOCA: 13/30  MMSE: 14/30 Pillbox test: Unable to complete, per OT patient is not able to manage his own medication in the next level of care. Patient will need 24/7 caregiver support to assist with IADLs and medication management to be successful in next level of care Concern for ability to live independently and to complete IADLs, currently patient refuses to live in assisted living. APS report accepted.  Follow-up on patient guardianship, s/p APS report accepted Started Aricept 5 mg qHS for poor memory   DISPO: Pending ACTT interview  Signed: JulMerrily BrittleO Psychiatry Resident, PGY-1 [facility]8/2023, 6:50 PM

## 2021-05-06 NOTE — Progress Notes (Signed)
°   05/06/21 0530  Sleep  Number of Hours 5

## 2021-05-07 DIAGNOSIS — F250 Schizoaffective disorder, bipolar type: Secondary | ICD-10-CM | POA: Diagnosis not present

## 2021-05-07 LAB — GLUCOSE, CAPILLARY
Glucose-Capillary: 239 mg/dL — ABNORMAL HIGH (ref 70–99)
Glucose-Capillary: 177 mg/dL — ABNORMAL HIGH (ref 70–99)
Glucose-Capillary: 242 mg/dL — ABNORMAL HIGH (ref 70–99)
Glucose-Capillary: 194 mg/dL — ABNORMAL HIGH (ref 70–99)
Glucose-Capillary: 201 mg/dL — ABNORMAL HIGH (ref 70–99)

## 2021-05-07 NOTE — Group Note (Signed)
Date:  05/07/2021 Time:  4:12 PM  Group Topic/Focus:  Emotional Education:   The focus of this group is to discuss what feelings/emotions are, and how they are experienced.    Participation Level:  Did Not Attend   Mitchell [name] 05/07/2021, 4:12 PM

## 2021-05-07 NOTE — Progress Notes (Signed)
[facility] MD Progress Note  05/07/2021 2:15 PM Mitchell [name]  MRN:  [id number]  Chief Complaint: delusions and SI  Subjective:   Mitchell [name] is a 61 y.o. male with PPHx of Schizophrenia and multiple hospitalizations, who presented to [facility] via IVC for delusion that he is [name] and threatening to jump into a trash compactor, then admitted Involuntary to [facility] for treatment of schizophrenia exacerbated by medication selective adherence.   Patient was evaluated in patient room on [place]. Patient was alert, less sleepy than yesterday, but very disinterested in the evaluation.  Patient continues to identify himself as "Mitchell [name]", he reported improvement in his constipation, denied diarrhea. He denied drooling, chest pain, shortness of breath. He reported stable sleep and appetite. Patient denied SI/HI/AVH, delusions, paranoia, first rank symptoms, and contracted to safety on the unit. Patient was not grossly responding to internal/external stimuli nor made any delusional statements during encounter.    Principal Problem: Schizophrenia ([place]) Diagnosis: Principal Problem:   Schizophrenia ([place]) Active Problems:   Hypertension   Type 2 diabetes mellitus ([place])  Total Time Spent in Direct Patient Care: I personally spent 30 minutes on the unit in direct patient care. The direct patient care time included face-to-face time with the patient, reviewing the patient's chart, communicating with other professionals, and coordinating care. Greater than 50% of this time was spent in counseling or coordinating care with the patient regarding goals of hospitalization, psycho-education, and discharge planning needs.   Past Psychiatric History: Past psych diagnoses: Schizophrenia Prior inpatient treatment: [place] ~14x (latest 01/28/2021), [facility] ~2020 Suicide attempts:  Psychiatric med trials: Zyprexa, loxapine, paliperidone injection/156 mg, Geodon Neuromodulation  history: N/A Current outpatient psychiatrist:  Current outpatient therapist:  History of selective adherence: Yes.    Past Medical History:  Past Medical History:  Diagnosis Date   Bipolar affective disorder ([place])    takes Zyprexa daily   Coarse tremors 10/02/2014   Diabetes mellitus    takes Victoza,Metformin,and Glipizide daily   Hypertension    takes Amlodipine,Lisinopril and Clonidine daily   Hyponatremia    history of   Lithium toxicity 10/02/2014   Mental disorder    takes Lithium daily   Schizoaffective disorder    takes Trazodone nightly   Schizoaffective disorder ([place]) 01/04/2019   Seasonal allergies    takes Claritin daily   Sleep apnea    sleep study >65yr ago   Stroke ([facility]    left arm weakness    Past Surgical History:  Procedure Laterality Date   CATARACT EXTRACTION W/PHACO Right 02/14/2013   Procedure: CATARACT EXTRACTION PHACO AND INTRAOCULAR LENS PLACEMENT ([place];  Surgeon: GAdonis Brook MD;  Location: [facility]  Service: Ophthalmology;  Laterality: Right;   CATARACT EXTRACTION W/PHACO Left 06/13/2013   Procedure: CATARACT EXTRACTION PHACO AND INTRAOCULAR LENS PLACEMENT (IOC);  Surgeon: GAdonis Brook MD;  Location: [facility]  Service: Ophthalmology;  Laterality: Left;   CIRCUMCISION  20 yrs. ago   EYE SURGERY     Family History:see H&P  Family Psychiatric History: unable to obtain  Social History:  Social History   Substance and Sexual Activity  Alcohol Use Not Currently     Social History   Substance and Sexual Activity  Drug Use No    Social History   Socioeconomic History   Marital status: Divorced    Spouse name: Not on file   Number of children: Not on file   Years of education: Not  on file   Highest education level: Not on file  Occupational History   Not on file  Tobacco Use   Smoking status: Never   Smokeless tobacco: Never  Vaping Use   Vaping Use: Never used  Substance and Sexual Activity   Alcohol use: Not Currently   Drug use: No    Sexual activity: Yes    Birth control/protection: None  Other Topics Concern   Not on file  Social History Narrative   Not on file   Social Determinants of Health   Financial Resource Strain: Not on file  Food Insecurity: Not on file  Transportation Needs: Not on file  Physical Activity: Not on file  Stress: Not on file  Social Connections: Not on file   Sleep: Good  Appetite: Good  Current Medications: Current Facility-Administered Medications  Medication Dose Route Frequency Provider Last Rate Last Admin   acetaminophen (TYLENOL) tablet 650 mg  650 mg Oral Q6H PRN Lucky Rathke, FNP       alum & mag hydroxide-simeth (MAALOX/MYLANTA) 200-200-20 MG/5ML suspension 30 mL  30 mL Oral Q4H PRN Lucky Rathke, FNP       amLODipine (NORVASC) tablet 10 mg  10 mg Oral QHS Merrily Brittle, DO   10 mg at 05/06/21 2105   aspirin EC tablet 81 mg  81 mg Oral Daily Lucky Rathke, FNP   81 mg at 05/07/21 0813   atenolol (TENORMIN) tablet 12.5 mg  12.5 mg Oral Daily Lucky Rathke, FNP   12.5 mg at 05/07/21 0813   cloNIDine (CATAPRES) tablet 0.2 mg  0.2 mg Oral BID Lucky Rathke, FNP   0.2 mg at 05/07/21 0349   cloZAPine (CLOZARIL) tablet 100 mg  100 mg Oral QHS Nelda Marseille, Amy E, MD   100 mg at 05/06/21 2106   cloZAPine (CLOZARIL) tablet 50 mg  50 mg Oral Daily Nelda Marseille, Amy E, MD   50 mg at 05/07/21 0813   donepezil (ARICEPT) tablet 5 mg  5 mg Oral QHS Merrily Brittle, DO   5 mg at 05/06/21 2105   haloperidol (HALDOL) tablet 15 mg  15 mg Oral QHS Viann Fish E, MD   15 mg at 05/06/21 2105   haloperidol (HALDOL) tablet 5 mg  5 mg Oral Daily Nelda Marseille, Amy E, MD   5 mg at 05/07/21 0813   insulin aspart (novoLOG) injection 0-15 Units  0-15 Units Subcutaneous TID WC Merrily Brittle, DO   5 Units at 05/07/21 1213   insulin aspart (novoLOG) injection 0-5 Units  0-5 Units Subcutaneous QHS Merrily Brittle, DO   2 Units at 05/06/21 2107   insulin aspart protamine- aspart (NOVOLOG MIX 70/30) injection 35  Units  35 Units Subcutaneous Q supper Merrily Brittle, DO   35 Units at 05/06/21 1645   insulin aspart protamine- aspart (NOVOLOG MIX 70/30) injection 52 Units  52 Units Subcutaneous Q breakfast Merrily Brittle, DO   52 Units at 05/07/21 0815   lisinopril (ZESTRIL) tablet 20 mg  20 mg Oral QHS Nelda Marseille, Amy E, MD   20 mg at 05/06/21 2106   lithium carbonate (ESKALITH) CR tablet 450 mg  450 mg Oral Q12H Lucky Rathke, FNP   450 mg at 05/07/21 0815   OLANZapine zydis (ZYPREXA) disintegrating tablet 10 mg  10 mg Oral Q8H PRN Corky Sox, MD       And   LORazepam (ATIVAN) tablet 1 mg  1 mg Oral PRN Corky Sox, MD  And   ziprasidone (GEODON) injection 20 mg  20 mg Intramuscular PRN Corky Sox, MD       magnesium hydroxide (MILK OF MAGNESIA) suspension 30 mL  30 mL Oral Daily PRN Lucky Rathke, FNP       senna (SENOKOT) tablet 17.2 mg  2 tablet Oral Daily Merrily Brittle, DO   17.2 mg at 05/07/21 0813   traZODone (DESYREL) tablet 50 mg  50 mg Oral QHS PRN Lucky Rathke, FNP   50 mg at 05/06/21 2106    Lab Results:  Results for orders placed or performed during the hospital encounter of 04/23/21 (from the past 48 hour(s))  Glucose, capillary     Status: Abnormal   Collection Time: 05/05/21  4:48 PM  Result Value Ref Range   Glucose-Capillary 241 (H) 70 - 99 mg/dL    Comment: Glucose reference range applies only to samples taken after fasting for at least 8 hours.  Glucose, capillary     Status: Abnormal   Collection Time: 05/05/21  7:51 PM  Result Value Ref Range   Glucose-Capillary 367 (H) 70 - 99 mg/dL    Comment: Glucose reference range applies only to samples taken after fasting for at least 8 hours.  Glucose, capillary     Status: Abnormal   Collection Time: 05/06/21  5:34 AM  Result Value Ref Range   Glucose-Capillary 188 (H) 70 - 99 mg/dL    Comment: Glucose reference range applies only to samples taken after fasting for at least 8 hours.  Glucose, capillary     Status:  Abnormal   Collection Time: 05/06/21 11:46 AM  Result Value Ref Range   Glucose-Capillary 228 (H) 70 - 99 mg/dL    Comment: Glucose reference range applies only to samples taken after fasting for at least 8 hours.  Glucose, capillary     Status: Abnormal   Collection Time: 05/06/21  4:40 PM  Result Value Ref Range   Glucose-Capillary 191 (H) 70 - 99 mg/dL    Comment: Glucose reference range applies only to samples taken after fasting for at least 8 hours.  Glucose, capillary     Status: Abnormal   Collection Time: 05/06/21  7:38 PM  Result Value Ref Range   Glucose-Capillary 239 (H) 70 - 99 mg/dL    Comment: Glucose reference range applies only to samples taken after fasting for at least 8 hours.  Glucose, capillary     Status: Abnormal   Collection Time: 05/07/21  5:39 AM  Result Value Ref Range   Glucose-Capillary 177 (H) 70 - 99 mg/dL    Comment: Glucose reference range applies only to samples taken after fasting for at least 8 hours.  Glucose, capillary     Status: Abnormal   Collection Time: 05/07/21 12:10 PM  Result Value Ref Range   Glucose-Capillary 242 (H) 70 - 99 mg/dL    Comment: Glucose reference range applies only to samples taken after fasting for at least 8 hours.    Blood Alcohol level:  Lab Results  Component Value Date   ETH <10 04/16/2021   ETH <10 71/08/2692    Metabolic Disorder Labs: Lab Results  Component Value Date   HGBA1C 9.2 (H) 04/24/2021   MPG 217.34 04/24/2021   MPG 271.87 01/26/2021   No results found for: PROLACTIN Lab Results  Component Value Date   CHOL 140 01/30/2021   TRIG 95 01/30/2021   HDL 49 01/30/2021   CHOLHDL 2.9 01/30/2021   VLDL 19  01/30/2021   [place] 72 01/30/2021   [place] 67 05/20/2020   Psychiatric Specialty Exam: Physical Exam Vitals and nursing note reviewed.  HENT:     Head: Normocephalic.  Pulmonary:     Effort: No respiratory distress.  Neurological:     General: No focal deficit present.     Mental  Status: He is alert.     Gait: Gait normal.  Psychiatric:        Behavior: Behavior is cooperative.    Review of Systems  Respiratory:  Negative for shortness of breath.   Cardiovascular:  Negative for chest pain and palpitations.  Gastrointestinal:  Negative for abdominal pain, constipation, diarrhea, nausea and vomiting.  Neurological:  Negative for tremors and headaches.    Body mass index is 37.42 kg/m. Temp:  [97.8 F (36.6 C)] 97.8 F (36.6 C) (02/09 0602) Pulse Rate:  [84-92] 92 (02/09 0603) Resp:  [20] 20 (02/09 0603) BP: (117-159)/(76-99) 117/79 (02/09 0603) SpO2:  [97 %-99 %] 99 % (02/09 0602)  General Appearance:  Older male, pink button up shirt, less sleepy, tired appearing, disinterested   Eye Contact:   Poor    Speech:   Mumbling quality at times but coherent, regular rate    Volume:   Decreased    Mood:   "Great" - incongruent to affect    Affect:   Flat, depressed    Thought Process:   Concrete, linear   Duration of Psychotic Symptoms:  Greater than six months  Past Diagnosis of Schizophrenia or Psychoactive disorder:  Yes   Orientation:   Alert and oriented to location, city, state, year, month, county.  Not oriented to current president or situation    Thought Content:  Illogical - Has delusions that he is Mitchell [name], that he is the current president, that he is rich; denies AVH, ideas of reference, or first rank symptoms; is not grossly responding to internal/external stimuli on exam, appears paranoid at times on exam with questioning  Ideas of reference: None    Suicidal Thoughts:   No    Homicidal Thoughts:   No    Memory:   Limited     Judgement:   Impaired    Insight:   Lacking    Psychomotor Activity:   No tremors    Concentration:   Fair    Attention Span:   Fair   Recall:   Poor    Fund of Knowledge:   Limited    Language:   Fair    Handed:   Right    Assets:   Leisure Time    Sleep:   5 hours    AIMS: Facial and Oral Movements Muscles of Facial Expression: None, normal Lips and Perioral Area: None, normal Jaw: None, normal Tongue: None, normal,Extremity Movements Upper (arms, wrists, hands, fingers): None, normal Lower (legs, knees, ankles, toes): None, normal, Trunk Movements Neck, shoulders, hips: None, normal, Overall Severity Severity of abnormal movements (highest score from questions above): None, normal Incapacitation due to abnormal movements: None, normal Patient's awareness of abnormal movements (rate only patient's report): No Awareness, Dental Status Current problems with teeth and/or dentures?: No Does patient usually wear dentures?: No   MOCA: 13/30  MMSE: 14/30 Pillbox test: Unable to complete     Treatment Plan Summary: Daily contact with patient to assess and evaluate symptoms and progress in treatment and Medication management  Safety and Monitoring -- Involuntary admission to inpatient psychiatric unit for safety, stabilization and treatment -- Daily contact  with patient to assess and evaluate symptoms and progress in treatment -- Patient's case to be discussed in multi-disciplinary team meeting -- Observation Level : q15 minute checks -- Vital signs:  q12 hours -- Precautions: suicide  Treatment Assessment & Plan Summary: Principal Problem:   Schizophrenia ([place]) Active Problems:   Hypertension   Type 2 diabetes mellitus ([place])   Schizophrenia by hx -ACTT referral pending -Continued Haldol 5 mg qAM & 15 mg qPM due daytime sedation - goal to titrate down on Haldol over time as he stabilizes on Clozaril -Continued Clozaril 50 mg qAM & 100 mg qPM (first dose of 100 mg 2/3) - WBC 6.0 and ANC 3200 on 2/3, Troponin 3 on 05/01/21, CRP 1.2 on 05/01/21 - repeat labs due 05/08/21 - Clozaril level 187, NorClozapine 87 (2/5) - QTC 442m on 2/7  - A1c 9.2 and lipids WNL in 11/22 - Increase senokot to 2 tabs daily for Clozaril-induced constipation  -Continued  Lithium 450 mg BID for mood stability - Li level 0.67 2/3 - Cr 1.09 - trending closely while on Li and encouraging po hydration - TSH WNL   T2DM with insulin dependence A1C of 9.2. Diabetic coordinator on-board for insulin management -Continued insulin per diabetic coordinator with increase in 70/30 to 52u with breakfast and continue 70/30 35u with supper -Continued SSI moderate -Continued HS coverage -Placed on unit restriction for meals to decrease insulin burden and dietary/nutrition consult ordered   HTN Continue home regimen  -Lisinopril 210mdaily (reduced due to concern for creatinine) -Amlodipine 10 mg daily -Clonidine 0.2 mg BID -Atenolol 12.56m3maily   Elevated transaminases, stable Repeat 05/01/21: AST 82 and ALT 136; Alk phos 139 - Med consulted, stated that no further work up necessary - Hepatitis panel nonreactive, ammonia 35, negative RUQ u/s 02/10/21 - Will need outpatient f/u after discharge - Trending labs per above  Cognitive impairment MOCA: 13/30  MMSE: 14/30 Pillbox test: Unable to complete, per OT patient is not able to manage his own medication in the next level of care. Patient will need 24/7 caregiver support to assist with IADLs and medication management to be successful in next level of care Concern for ability to live independently and to complete IADLs, currently patient refuses to live in assisted living. APS report accepted.  Follow-up on patient guardianship, s/p APS report accepted Continued Aricept 5 mg qHS for poor memory   DISPO: Pending ACTT interview  Signed: JulMerrily BrittleO Psychiatry Resident, PGY-1 [facility]9/2023, 2:15 PM

## 2021-05-07 NOTE — Progress Notes (Signed)
°   05/07/21 2000  Psych Admission Type (Psych Patients Only)  Admission Status Involuntary  Psychosocial Assessment  Patient Complaints None  Eye Contact Fair  Facial Expression Flat  Affect Preoccupied  Speech Logical/coherent  Interaction Assertive  Motor Activity Other (Comment) (WDL)  Appearance/Hygiene Disheveled  Behavior Characteristics Cooperative  Mood Preoccupied  Aggressive Behavior  Effect No apparent injury  Thought Process  Coherency Disorganized  Content Magical thinking  Delusions None reported or observed  Perception Hallucinations  Hallucination None reported or observed  Judgment Limited  Confusion Moderate  Danger to Self  Current suicidal ideation? Denies  Danger to Others  Danger to Others None reported or observed

## 2021-05-07 NOTE — Group Note (Signed)
Recreation Therapy Group Note   Group Topic:Health and Wellness  Group Date: 05/07/2021 Start Time: 1000 End Time: 1025 Facilitators: Caroll Rancher, LRT,CTRS Location: [address] Dayroom   Goal Area(s) Addresses:  Patient will define components of whole wellness. Patient will verbalize benefit of whole wellness.  Group Description:  Exercise.  LRT discussed the importance of exercise with patients.  LRT led patients in a series of stretches to loosen the muscles.  Patients then took turns leading the group in exercises of their choosing.  LRT explained the exercise will consist of at least 30 minutes and encouraged to get water and take breaks if needed.   Affect/Mood: N/A   Participation Level: Did not attend    Clinical Observations/Individualized Feedback:      Plan: Continue to engage patient in RT group sessions 2-3x/week.   Caroll Rancher, LRT,CTRS 05/07/2021 11:38 AM

## 2021-05-07 NOTE — Progress Notes (Signed)
°   05/07/21 1300  Psych Admission Type (Psych Patients Only)  Admission Status Involuntary  Psychosocial Assessment  Patient Complaints None  Eye Contact Fair  Facial Expression Flat  Affect Preoccupied  Speech Logical/coherent  Interaction Assertive  Motor Activity Other (Comment) (WDL)  Appearance/Hygiene Disheveled  Behavior Characteristics Cooperative  Mood Preoccupied  Aggressive Behavior  Effect No apparent injury  Thought Process  Coherency Disorganized  Content Magical thinking  Delusions None reported or observed  Perception Hallucinations  Hallucination None reported or observed  Judgment Limited  Confusion Moderate  Danger to Self  Current suicidal ideation? Denies  Danger to Others  Danger to Others None reported or observed

## 2021-05-08 ENCOUNTER — Encounter (HOSPITAL_COMMUNITY): Payer: Self-pay

## 2021-05-08 DIAGNOSIS — F250 Schizoaffective disorder, bipolar type: Secondary | ICD-10-CM | POA: Diagnosis not present

## 2021-05-08 LAB — COMPREHENSIVE METABOLIC PANEL
ALT: 75 U/L — ABNORMAL HIGH (ref 0–44)
AST: 48 U/L — ABNORMAL HIGH (ref 15–41)
Albumin: 3.7 g/dL (ref 3.5–5.0)
Alkaline Phosphatase: 136 U/L — ABNORMAL HIGH (ref 38–126)
Anion gap: 7 (ref 5–15)
BUN: 28 mg/dL — ABNORMAL HIGH (ref 6–20)
CO2: 26 mmol/L (ref 22–32)
Calcium: 8.8 mg/dL — ABNORMAL LOW (ref 8.9–10.3)
Chloride: 100 mmol/L (ref 98–111)
Creatinine, Ser: 1.28 mg/dL — ABNORMAL HIGH (ref 0.61–1.24)
GFR, Estimated: >60 mL/min (ref >60)
Glucose, Bld: 344 mg/dL — ABNORMAL HIGH (ref 70–99)
Potassium: 4.5 mmol/L (ref 3.5–5.1)
Sodium: 133 mmol/L — ABNORMAL LOW (ref 135–145)
Total Bilirubin: 0.2 mg/dL — ABNORMAL LOW (ref 0.3–1.2)
Total Protein: 6.8 g/dL (ref 6.5–8.1)

## 2021-05-08 LAB — CBC WITH DIFFERENTIAL/PLATELET
Abs Immature Granulocytes: 0.02 10*3/uL (ref 0.00–0.07)
Basophils Absolute: 0 10*3/uL (ref 0.0–0.1)
Basophils Relative: 1 %
Eosinophils Absolute: 0.3 10*3/uL (ref 0.0–0.5)
Eosinophils Relative: 4 %
HCT: 37 % — ABNORMAL LOW (ref 39.0–52.0)
Hemoglobin: 11.8 g/dL — ABNORMAL LOW (ref 13.0–17.0)
Immature Granulocytes: 0 %
Lymphocytes Relative: 35 %
Lymphs Abs: 2.3 10*3/uL (ref 0.7–4.0)
MCH: 30.6 pg (ref 26.0–34.0)
MCHC: 31.9 g/dL (ref 30.0–36.0)
MCV: 96.1 fL (ref 80.0–100.0)
Monocytes Absolute: 0.4 10*3/uL (ref 0.1–1.0)
Monocytes Relative: 7 %
Neutro Abs: 3.5 10*3/uL (ref 1.7–7.7)
Neutrophils Relative %: 53 %
Platelets: 227 10*3/uL (ref 150–400)
RBC: 3.85 MIL/uL — ABNORMAL LOW (ref 4.22–5.81)
RDW: 13.2 % (ref 11.5–15.5)
WBC: 6.5 10*3/uL (ref 4.0–10.5)
nRBC: 0 % (ref 0.0–0.2)

## 2021-05-08 LAB — GLUCOSE, CAPILLARY
Glucose-Capillary: 171 mg/dL — ABNORMAL HIGH (ref 70–99)
Glucose-Capillary: 163 mg/dL — ABNORMAL HIGH (ref 70–99)
Glucose-Capillary: 245 mg/dL — ABNORMAL HIGH (ref 70–99)
Glucose-Capillary: 276 mg/dL — ABNORMAL HIGH (ref 70–99)

## 2021-05-08 LAB — TROPONIN I (HIGH SENSITIVITY): Troponin I (High Sensitivity): 3 ng/L (ref <18)

## 2021-05-08 MED ORDER — CLOZAPINE 25 MG PO TABS
25.0000 mg | ORAL_TABLET | Freq: Every day | ORAL | Status: DC
  Administered 2021-05-09: 25 mg via ORAL
  Filled 2021-05-08 (×2): qty 1

## 2021-05-08 MED ORDER — CLOZAPINE 25 MG PO TABS
75.0000 mg | ORAL_TABLET | Freq: Every day | ORAL | Status: DC
  Administered 2021-05-08 – 2021-05-26 (×19): 75 mg via ORAL
  Filled 2021-05-08 (×20): qty 3

## 2021-05-08 MED ORDER — CHEWING GUM (ORBIT) SUGAR FREE
1.0000 | CHEWING_GUM | Freq: Three times a day (TID) | ORAL | Status: DC | PRN
  Filled 2021-05-08 (×2): qty 1

## 2021-05-08 NOTE — Progress Notes (Signed)
Pt was encouraged but didn't attend orientation/goals group. ?

## 2021-05-08 NOTE — Group Note (Signed)
LCSW Group Therapy Note  Group Date: 05/08/2021 Start Time: 1100 End Time: 1200   Type of Therapy and Topic:  Group Therapy - Healthy vs Unhealthy Coping Skills  Participation Level:  Minimal   Description of Group The focus of this group was to determine what unhealthy coping techniques typically are used by group members and what healthy coping techniques would be helpful in coping with various problems. Patients were guided in becoming aware of the differences between healthy and unhealthy coping techniques. Patients were asked to identify 2-3 healthy coping skills they would like to learn to use more effectively.  Therapeutic Goals Patients learned that coping is what human beings do all day long to deal with various situations in their lives Patients defined and discussed healthy vs unhealthy coping techniques Patients identified their preferred coping techniques and identified whether these were healthy or unhealthy Patients determined 2-3 healthy coping skills they would like to become more familiar with and use more often. Patients provided support and ideas to each other   Summary of Patient Progress:  Patient participated in the beginning of group however, left shortly after. Patient shared that coping skills can help keep his mind focused and function right.    Therapeutic Modalities Cognitive Behavioral Therapy Motivational Interviewing  Vassie Moselle, LCSW 05/08/2021  11:44 AM

## 2021-05-08 NOTE — Progress Notes (Signed)
°   05/08/21 0515  Sleep  Number of Hours 6.25

## 2021-05-08 NOTE — BH IP Treatment Plan (Signed)
Interdisciplinary Treatment and Diagnostic Plan Update  05/08/2021 Mitchell [name] MRN: [id number]  Principal Diagnosis: Schizophrenia [facility])  Secondary Diagnoses: Principal Problem:   Schizophrenia (HCC) Active Problems:   Hypertension   Type 2 diabetes mellitus (HCC)   Current Medications:  Current Facility-Administered Medications  Medication Dose Route Frequency Provider Last Rate Last Admin   acetaminophen (TYLENOL) tablet 650 mg  650 mg Oral Q6H PRN Lenard Lance, FNP       alum & mag hydroxide-simeth (MAALOX/MYLANTA) 200-200-20 MG/5ML suspension 30 mL  30 mL Oral Q4H PRN Lenard Lance, FNP       amLODipine (NORVASC) tablet 10 mg  10 mg Oral QHS Princess Bruins, DO   10 mg at 05/07/21 2039   aspirin EC tablet 81 mg  81 mg Oral Daily Lenard Lance, FNP   81 mg at 05/08/21 0816   atenolol (TENORMIN) tablet 12.5 mg  12.5 mg Oral Daily Lenard Lance, FNP   12.5 mg at 05/08/21 0816   chewing gum (ORBIT) sugar free  1 Stick Oral TID PRN Princess Bruins, DO       cloNIDine (CATAPRES) tablet 0.2 mg  0.2 mg Oral BID Lenard Lance, FNP   0.2 mg at 05/08/21 0816   [START ON 05/09/2021] cloZAPine (CLOZARIL) tablet 25 mg  25 mg Oral Daily Princess Bruins, DO       cloZAPine (CLOZARIL) tablet 75 mg  75 mg Oral QHS Princess Bruins, DO       donepezil (ARICEPT) tablet 5 mg  5 mg Oral QHS Princess Bruins, DO   5 mg at 05/07/21 2040   haloperidol (HALDOL) tablet 15 mg  15 mg Oral QHS Bartholomew Crews E, MD   15 mg at 05/07/21 2039   haloperidol (HALDOL) tablet 5 mg  5 mg Oral Daily Bartholomew Crews E, MD   5 mg at 05/08/21 0816   insulin aspart (novoLOG) injection 0-15 Units  0-15 Units Subcutaneous TID WC Princess Bruins, DO   3 Units at 05/08/21 1257   insulin aspart (novoLOG) injection 0-5 Units  0-5 Units Subcutaneous QHS Princess Bruins, DO   2 Units at 05/07/21 2040   insulin aspart protamine- aspart (NOVOLOG MIX 70/30) injection 35 Units  35 Units Subcutaneous Q supper Princess Bruins, DO   35 Units at 05/07/21  1818   insulin aspart protamine- aspart (NOVOLOG MIX 70/30) injection 52 Units  52 Units Subcutaneous Q breakfast Princess Bruins, DO   52 Units at 05/08/21 0818   lisinopril (ZESTRIL) tablet 20 mg  20 mg Oral QHS Mason Jim, Amy E, MD   20 mg at 05/07/21 2040   lithium carbonate (ESKALITH) CR tablet 450 mg  450 mg Oral Q12H Lenard Lance, FNP   450 mg at 05/08/21 0816   OLANZapine zydis (ZYPREXA) disintegrating tablet 10 mg  10 mg Oral Q8H PRN Carlyn Reichert, MD       And   LORazepam (ATIVAN) tablet 1 mg  1 mg Oral PRN Carlyn Reichert, MD       And   ziprasidone (GEODON) injection 20 mg  20 mg Intramuscular PRN Carlyn Reichert, MD       magnesium hydroxide (MILK OF MAGNESIA) suspension 30 mL  30 mL Oral Daily PRN Lenard Lance, FNP       senna (SENOKOT) tablet 17.2 mg  2 tablet Oral Daily Princess Bruins, DO   17.2 mg at 05/08/21 6295   PTA Medications: Medications Prior to Admission  Medication Sig Dispense  Refill Last Dose   amLODipine (NORVASC) 10 MG tablet Take 1 tablet (10 mg total) by mouth daily. (Patient not taking: Reported on 04/16/2021) 90 tablet 1    aspirin 81 MG EC tablet Take 1 tablet (81 mg total) by mouth daily. Swallow whole. (Patient not taking: Reported on 04/16/2021) 30 tablet 12    atenolol (TENORMIN) 25 MG tablet Take 0.5 tablets (12.5 mg total) by mouth daily. (Patient not taking: Reported on 04/16/2021) 15 tablet 0    benztropine (COGENTIN) 1 MG tablet Take 1 tablet (1 mg total) by mouth 2 (two) times daily. 60 tablet 2    cloNIDine (CATAPRES) 0.2 MG tablet Take 0.2 mg by mouth 2 (two) times daily. (Patient not taking: Reported on 01/26/2021)      cloZAPine (CLOZARIL) 50 MG tablet Take 1 tablet (50 mg total) by mouth 2 (two) times daily. 60 tablet 0    glipiZIDE (GLUCOTROL XL) 10 MG 24 hr tablet Take 2 tablets (20 mg total) by mouth daily with breakfast. 60 tablet 0    haloperidol (HALDOL) 20 MG tablet Take 1 tablet (20 mg total) by mouth at bedtime. (Patient not taking:  Reported on 01/26/2021) 90 tablet 1    insulin aspart (NOVOLOG) 100 UNIT/ML injection Inject 0-9 Units into the skin 3 (three) times daily with meals. 10 mL 11    insulin aspart protamine - aspart (NOVOLOG MIX 70/30 FLEXPEN) (70-30) 100 UNIT/ML FlexPen Inject 48 Units into the skin daily with breakfast. 15 mL 0    insulin aspart protamine- aspart (NOVOLOG MIX 70/30) (70-30) 100 UNIT/ML injection Inject 0.42 mLs (42 Units total) into the skin daily with supper. 10 mL 11    lisinopril (ZESTRIL) 40 MG tablet Take 1 tablet (40 mg total) by mouth daily. (Patient not taking: Reported on 01/11/2020) 90 tablet 1    lithium carbonate (ESKALITH) 450 MG CR tablet Take 1 tablet (450 mg total) by mouth every 12 (twelve) hours. (Patient not taking: Reported on 01/26/2021) 60 tablet 2    traZODone (DESYREL) 50 MG tablet Take 1 tablet (50 mg total) by mouth at bedtime as needed for sleep.       Patient Stressors: Marital or family conflict   Medication change or noncompliance    Patient Strengths: Automotive engineer for treatment/growth   Treatment Modalities: Medication Management, Group therapy, Case management,  1 to 1 session with clinician, Psychoeducation, Recreational therapy.   Physician Treatment Plan for Primary Diagnosis: Schizophrenia (HCC) Long Term Goal(s):     Short Term Goals:    Medication Management: Evaluate patient's response, side effects, and tolerance of medication regimen.  Therapeutic Interventions: 1 to 1 sessions, Unit Group sessions and Medication administration.  Evaluation of Outcomes: Progressing  Physician Treatment Plan for Secondary Diagnosis: Principal Problem:   Schizophrenia (HCC) Active Problems:   Hypertension   Type 2 diabetes mellitus (HCC)  Long Term Goal(s):     Short Term Goals:       Medication Management: Evaluate patient's response, side effects, and tolerance of medication regimen.  Therapeutic Interventions: 1 to 1  sessions, Unit Group sessions and Medication administration.  Evaluation of Outcomes: Progressing   RN Treatment Plan for Primary Diagnosis: Schizophrenia (HCC) Long Term Goal(s): Knowledge of disease and therapeutic regimen to maintain health will improve  Short Term Goals: Ability to demonstrate self-control, Ability to participate in decision making will improve, and Ability to verbalize feelings will improve  Medication Management: RN will administer medications as ordered by  provider, will assess and evaluate patient's response and provide education to patient for prescribed medication. RN will report any adverse and/or side effects to prescribing provider.  Therapeutic Interventions: 1 on 1 counseling sessions, Psychoeducation, Medication administration, Evaluate responses to treatment, Monitor vital signs and CBGs as ordered, Perform/monitor CIWA, COWS, AIMS and Fall Risk screenings as ordered, Perform wound care treatments as ordered.  Evaluation of Outcomes: Progressing   LCSW Treatment Plan for Primary Diagnosis: Schizophrenia (HCC) Long Term Goal(s): Safe transition to appropriate next level of care at discharge, Engage patient in therapeutic group addressing interpersonal concerns.  Short Term Goals: Engage patient in aftercare planning with referrals and resources, Increase emotional regulation, and Facilitate acceptance of mental health diagnosis and concerns  Therapeutic Interventions: Assess for all discharge needs, 1 to 1 time with Social worker, Explore available resources and support systems, Assess for adequacy in community support network, Educate family and significant other(s) on suicide prevention, Complete Psychosocial Assessment, Interpersonal group therapy.  Evaluation of Outcomes: Progressing   Progress in Treatment: Attending groups: Yes. Participating in groups: Yes. Taking medication as prescribed: Yes. Toleration medication: Yes. Family/Significant  other contact made: Yes, individual(s) contacted:  attempted to contact Karsten Ro. Number is not active Patient understands diagnosis: No. Discussing patient identified problems/goals with staff: Yes. Medical problems stabilized or resolved: Yes. Denies suicidal/homicidal ideation: Yes. Issues/concerns per patient self-inventory: No. Other: None  New problem(s) identified: No, Describe:  None  New Short Term/Long Term Goal(s):medication stabilization, elimination of SI thoughts, development of comprehensive mental wellness plan.   Patient Goals:  "to sit down and mind by business and think about my triggers and songs."  Discharge Plan or Barriers: Pt will f/u with [facility] for services. APS has been contacted to see if pt is in need of guardian.   Reason for Continuation of Hospitalization: Medication stabilization  Estimated Length of Stay: 3-5 days   Scribe for Treatment Team: Chrys Racer 05/08/2021 2:35 PM

## 2021-05-08 NOTE — Progress Notes (Signed)
Adult Psychoeducational Group Note  Date:  05/08/2021 Time:  9:31 PM  Group Topic/Focus:  Wrap-Up Group:   The focus of this group is to help patients review their daily goal of treatment and discuss progress on daily workbooks.  Participation Level:  Active  Participation Quality:  Appropriate and Sharing  Affect:  Flat  Cognitive:  Alert  Insight: Limited  Engagement in Group:  Engaged  Modes of Intervention:  Discussion  Additional Comments:  Pt stated, "feel good and happy about myself. Maintained my outgoing self". Pt rated his day 10/10  Doristine Johns 05/08/2021, 9:31 PM

## 2021-05-08 NOTE — Group Note (Signed)
Recreation Therapy Group Note   Group Topic:Team Building  Group Date: 05/08/2021 Start Time: 1000 End Time: 1025 Facilitators: Caroll Rancher, LRT,CTRS Location: [address] Dayroom   Goal Area(s) Addresses:  Patient will effectively work with peer towards shared goal.  Patient will identify skills used to make activity successful.  Patient will identify how skills used during activity can be used to reach post d/c goals.    Group Description: Landing Pad. In teams of 3-5, patients were given 12 plastic drinking straws and an equal length of masking tape. Using the materials provided, patients were asked to build a landing pad to catch a golf ball dropped from approximately 5 feet in the air. All materials were required to be used by the team in their design. LRT facilitated post-activity discussion.   Affect/Mood: Flat   Participation Level: None   Participation Quality: None   Behavior: Drowsy   Speech/Thought Process: Irrational   Insight: Impaired   Judgement: Impaired   Modes of Intervention: Team-building   Patient Response to Interventions:  Disengaged   Education Outcome:  Acknowledges education and In group clarification offered    Clinical Observations/Individualized Feedback: Pt was drowsy.  Pt sat in chair and seemed to be dazing off before falling asleep.    Plan: Continue to engage patient in RT group sessions 2-3x/week.   Caroll Rancher, Antonietta Jewel 05/08/2021 12:44 PM

## 2021-05-08 NOTE — Progress Notes (Signed)
[facility] MD Progress Note  05/08/2021 4:29 PM Mitchell [name]  MRN:  [id number]  Chief Complaint: delusions and SI  Subjective:   Mitchell [name] is a 61 y.o. male with PPHx of Schizophrenia and multiple hospitalizations, who presented to [facility] via IVC for delusion that he is Mitchell [name] and [name] and threatening to jump into a trash compactor, then admitted Involuntary to [facility] for treatment of schizophrenia exacerbated by medication selective adherence.   Patient was evaluated in patient room on [name]. Patient was falling asleep, mildly irritable, disinterested but cooperative during evaluation. Patient reported "great", to sleep, mood, appetite. However his affect was very incongruent, where he appeared flat and depressed. He denied sleepiness, although he is falling asleep actively during interview. Patient denied SI/HI/AVH, paranoia, first rank symptoms, and contracted to safety on the unit. Patient was not grossly responding to internal/external stimuli during encounter.   Principal Problem: Schizophrenia ([place]) Diagnosis: Principal Problem:   Schizophrenia ([place]) Active Problems:   Hypertension   Type 2 diabetes mellitus ([place])  Total Time Spent in Direct Patient Care: I personally spent 30 minutes on the unit in direct patient care. The direct patient care time included face-to-face time with the patient, reviewing the patient's chart, communicating with other professionals, and coordinating care. Greater than 50% of this time was spent in counseling or coordinating care with the patient regarding goals of hospitalization, psycho-education, and discharge planning needs.   Past Psychiatric History: Past psych diagnoses: Schizophrenia Prior inpatient treatment: [facility] ~14x (latest 01/28/2021), [facility] ~2020 Suicide attempts:  Psychiatric med trials: Zyprexa, loxapine, paliperidone injection/156 mg, Geodon Neuromodulation history: N/A Current outpatient  psychiatrist:  Current outpatient therapist:  History of selective adherence: Yes.    Past Medical History:  Past Medical History:  Diagnosis Date   Bipolar affective disorder ([place])    takes Zyprexa daily   Coarse tremors 10/02/2014   Diabetes mellitus    takes Victoza,Metformin,and Glipizide daily   Hypertension    takes Amlodipine,Lisinopril and Clonidine daily   Hyponatremia    history of   Lithium toxicity 10/02/2014   Mental disorder    takes Lithium daily   Schizoaffective disorder    takes Trazodone nightly   Schizoaffective disorder ([place]) 01/04/2019   Seasonal allergies    takes Claritin daily   Sleep apnea    sleep study >66yr ago   Stroke ([employer]    left arm weakness    Past Surgical History:  Procedure Laterality Date   CATARACT EXTRACTION W/PHACO Right 02/14/2013   Procedure: CATARACT EXTRACTION PHACO AND INTRAOCULAR LENS PLACEMENT ([place];  Surgeon: GAdonis Brook MD;  [place]  Service: Ophthalmology;  Laterality: Right;   CATARACT EXTRACTION W/PHACO Left 06/13/2013   Procedure: CATARACT EXTRACTION PHACO AND INTRAOCULAR LENS PLACEMENT (IOC);  Surgeon: GAdonis Brook MD;  [place]  Service: Ophthalmology;  Laterality: Left;   CIRCUMCISION  20 yrs. ago   EYE SURGERY     Family History:see H&P  Family Psychiatric History: unable to obtain  Social History:  Social History   Substance and Sexual Activity  Alcohol Use Not Currently     Social History   Substance and Sexual Activity  Drug Use No    Social History   Socioeconomic History   Marital status: Divorced    Spouse name: Not on file   Number of children: Not on file   Years of education: Not on file   Highest education level: Not on file  Occupational History   Not on file  Tobacco Use   Smoking status: Never   Smokeless tobacco: Never  Vaping Use   Vaping Use: Never used  Substance and Sexual Activity   Alcohol use: Not Currently   Drug use: No   Sexual activity: Yes    Birth  control/protection: None  Other Topics Concern   Not on file  Social History Narrative   Not on file   Social Determinants of Health   Financial Resource Strain: Not on file  Food Insecurity: Not on file  Transportation Needs: Not on file  Physical Activity: Not on file  Stress: Not on file  Social Connections: Not on file   Sleep: Good  Appetite: Good  Current Medications: Current Facility-Administered Medications  Medication Dose Route Frequency Provider Last Rate Last Admin   acetaminophen (TYLENOL) tablet 650 mg  650 mg Oral Q6H PRN Lucky Rathke, FNP       alum & mag hydroxide-simeth (MAALOX/MYLANTA) 200-200-20 MG/5ML suspension 30 mL  30 mL Oral Q4H PRN Lucky Rathke, FNP       amLODipine (NORVASC) tablet 10 mg  10 mg Oral QHS Merrily Brittle, DO   10 mg at 05/07/21 2039   aspirin EC tablet 81 mg  81 mg Oral Daily Lucky Rathke, FNP   81 mg at 05/08/21 0816   atenolol (TENORMIN) tablet 12.5 mg  12.5 mg Oral Daily Lucky Rathke, FNP   12.5 mg at 05/08/21 0816   chewing gum (ORBIT) sugar free  1 Stick Oral TID PRN Merrily Brittle, DO       cloNIDine (CATAPRES) tablet 0.2 mg  0.2 mg Oral BID Lucky Rathke, FNP   0.2 mg at 05/08/21 0816   [START ON 05/09/2021] cloZAPine (CLOZARIL) tablet 25 mg  25 mg Oral Daily Merrily Brittle, DO       cloZAPine (CLOZARIL) tablet 75 mg  75 mg Oral QHS Merrily Brittle, DO       donepezil (ARICEPT) tablet 5 mg  5 mg Oral QHS Merrily Brittle, DO   5 mg at 05/07/21 2040   haloperidol (HALDOL) tablet 15 mg  15 mg Oral QHS Viann Fish E, MD   15 mg at 05/07/21 2039   haloperidol (HALDOL) tablet 5 mg  5 mg Oral Daily Viann Fish E, MD   5 mg at 05/08/21 0816   insulin aspart (novoLOG) injection 0-15 Units  0-15 Units Subcutaneous TID WC Merrily Brittle, DO   3 Units at 05/08/21 1257   insulin aspart (novoLOG) injection 0-5 Units  0-5 Units Subcutaneous QHS Merrily Brittle, DO   2 Units at 05/07/21 2040   insulin aspart protamine- aspart (NOVOLOG MIX 70/30)  injection 35 Units  35 Units Subcutaneous Q supper Merrily Brittle, DO   35 Units at 05/07/21 1818   insulin aspart protamine- aspart (NOVOLOG MIX 70/30) injection 52 Units  52 Units Subcutaneous Q breakfast Merrily Brittle, DO   52 Units at 05/08/21 0818   lisinopril (ZESTRIL) tablet 20 mg  20 mg Oral QHS Nelda Marseille, Amy E, MD   20 mg at 05/07/21 2040   lithium carbonate (ESKALITH) CR tablet 450 mg  450 mg Oral Q12H Lucky Rathke, FNP   450 mg at 05/08/21 0816   OLANZapine zydis (ZYPREXA) disintegrating tablet 10 mg  10 mg Oral Q8H PRN Corky Sox, MD       And   LORazepam (ATIVAN) tablet 1 mg  1 mg Oral PRN Corky Sox, MD  And   ziprasidone (GEODON) injection 20 mg  20 mg Intramuscular PRN Corky Sox, MD       magnesium hydroxide (MILK OF MAGNESIA) suspension 30 mL  30 mL Oral Daily PRN Lucky Rathke, FNP       senna (SENOKOT) tablet 17.2 mg  2 tablet Oral Daily Merrily Brittle, DO   17.2 mg at 05/08/21 8250    Lab Results:  Results for orders placed or performed during the hospital encounter of 04/23/21 (from the past 48 hour(s))  Glucose, capillary     Status: Abnormal   Collection Time: 05/06/21  4:40 PM  Result Value Ref Range   Glucose-Capillary 191 (H) 70 - 99 mg/dL    Comment: Glucose reference range applies only to samples taken after fasting for at least 8 hours.  Glucose, capillary     Status: Abnormal   Collection Time: 05/06/21  7:38 PM  Result Value Ref Range   Glucose-Capillary 239 (H) 70 - 99 mg/dL    Comment: Glucose reference range applies only to samples taken after fasting for at least 8 hours.  Glucose, capillary     Status: Abnormal   Collection Time: 05/07/21  5:39 AM  Result Value Ref Range   Glucose-Capillary 177 (H) 70 - 99 mg/dL    Comment: Glucose reference range applies only to samples taken after fasting for at least 8 hours.  Glucose, capillary     Status: Abnormal   Collection Time: 05/07/21 12:10 PM  Result Value Ref Range    Glucose-Capillary 242 (H) 70 - 99 mg/dL    Comment: Glucose reference range applies only to samples taken after fasting for at least 8 hours.  Glucose, capillary     Status: Abnormal   Collection Time: 05/07/21  4:59 PM  Result Value Ref Range   Glucose-Capillary 194 (H) 70 - 99 mg/dL    Comment: Glucose reference range applies only to samples taken after fasting for at least 8 hours.  Glucose, capillary     Status: Abnormal   Collection Time: 05/07/21  7:42 PM  Result Value Ref Range   Glucose-Capillary 201 (H) 70 - 99 mg/dL    Comment: Glucose reference range applies only to samples taken after fasting for at least 8 hours.   Comment 1 Notify RN    Comment 2 Document in Chart   Glucose, capillary     Status: Abnormal   Collection Time: 05/08/21  5:54 AM  Result Value Ref Range   Glucose-Capillary 171 (H) 70 - 99 mg/dL    Comment: Glucose reference range applies only to samples taken after fasting for at least 8 hours.   Comment 1 Notify RN    Comment 2 Document in Chart   Glucose, capillary     Status: Abnormal   Collection Time: 05/08/21 11:58 AM  Result Value Ref Range   Glucose-Capillary 163 (H) 70 - 99 mg/dL    Comment: Glucose reference range applies only to samples taken after fasting for at least 8 hours.    Blood Alcohol level:  Lab Results  Component Value Date   ETH <10 04/16/2021   ETH <10 03/70/4888    Metabolic Disorder Labs: Lab Results  Component Value Date   HGBA1C 9.2 (H) 04/24/2021   MPG 217.34 04/24/2021   MPG 271.87 01/26/2021   No results found for: PROLACTIN Lab Results  Component Value Date   CHOL 140 01/30/2021   TRIG 95 01/30/2021   HDL 49 01/30/2021   CHOLHDL  2.9 01/30/2021   VLDL 19 01/30/2021   LDLCALC 72 01/30/2021   LDLCALC 67 05/20/2020   Psychiatric Specialty Exam: Physical Exam Vitals and nursing note reviewed.  Constitutional:      General: He is not in acute distress.    Appearance: He is not ill-appearing or diaphoretic.   HENT:     Head: Normocephalic.  Pulmonary:     Effort: Pulmonary effort is normal. No respiratory distress.  Neurological:     General: No focal deficit present.     Mental Status: He is alert.  Psychiatric:        Behavior: Behavior is cooperative.     Review of Systems  Respiratory:  Negative for shortness of breath.   Cardiovascular:  Negative for chest pain.  Gastrointestinal:  Negative for nausea and vomiting.  Neurological:  Negative for dizziness and headaches.    Body mass index is 37.42 kg/m. Temp:  [97.8 F (36.6 C)-97.9 F (36.6 C)] 97.9 F (36.6 C) (02/10 1619) Pulse Rate:  [81-93] 89 (02/10 1619) Resp:  [16] 16 (02/10 1619) BP: (107-133)/(71-80) 112/74 (02/10 1619) SpO2:  [100 %] 100 % (02/10 1619)  General Appearance:  Disheveled (Same pink button up shirt)    Eye Contact:   Poor    Speech:   Normal Rate (Reticent, sometimes mumbling qualities, otherwise coherent)    Volume:   Decreased    Mood:   Dysphoric ("Great")    Affect:   Depressed; Flat; Non-Congruent    Thought Process:  Other (comment); Linear (Impoverish)  Descriptions of Associations: Intact  Duration of Psychotic Symptoms:  Greater than six months  Past Diagnosis of Schizophrenia or Psychoactive disorder:  Yes   Orientation:   Partial (Oriented to year, [facility], [place].  Not president, not month (January))   Thought Content:   Other (comment); Delusions; Illogical (Poverty of thought content.  Delusions of being [name], that his birthday is 04/23/2023, but currently is the year 20[birth year].)  Hallucinations: None Per Nursing patient is responding to internal stimuli, talking out to unseen people.  Ideas of Reference: None   Suicidal Thoughts:   No   Homicidal Thoughts:   No    Memory:   Immediate Fair; Remote Poor; Recent Poor    Judgement:   Fair   Insight:   Shallow    Psychomotor Activity:   Decreased (No tremors)    Concentration:    Fair    Attention Span:   Fair   Recall:   Poor    Fund of Knowledge:   Fair    Language:   Fair    Handed:   Right    Assets:   Leisure Time    Sleep:    Number of Hours: 6.25  AIMS: Facial and Oral Movements Muscles of Facial Expression: None, normal Lips and Perioral Area: None, normal Jaw: None, normal Tongue: None, normal,Extremity Movements Upper (arms, wrists, hands, fingers): None, normal Lower (legs, knees, ankles, toes): None, normal, Trunk Movements Neck, shoulders, hips: None, normal, Overall Severity Severity of abnormal movements (highest score from questions above): None, normal Incapacitation due to abnormal movements: None, normal Patient's awareness of abnormal movements (rate only patient's report): No Awareness, Dental Status Current problems with teeth and/or dentures?: No Does patient usually wear dentures?: No   CIWA:   COWS:    MOCA: 13/30  MMSE: 14/30 Pillbox test: Failed     Treatment Plan Summary: Daily contact with patient to assess and evaluate symptoms and progress  in treatment and Medication management  Safety and Monitoring -- Involuntary admission to inpatient psychiatric unit for safety, stabilization and treatment -- Daily contact with patient to assess and evaluate symptoms and progress in treatment -- Patient's case to be discussed in multi-disciplinary team meeting -- Observation Level : q15 minute checks -- Vital signs:  q12 hours -- Precautions: suicide  Treatment Assessment & Plan Summary: Principal Problem:   Schizophrenia ([place]) Active Problems:   Hypertension   Type 2 diabetes mellitus ([place])   Schizophrenia by hx -ACTT referral pending -Continued Haldol 5 mg qAM & 15 mg qPM due daytime sedation - goal to titrate down on Haldol over time as he stabilizes on Clozaril -Decreased Clozaril 50 mg qAM & 100 mg qPM to 25 mg every morning +25 mg qPM, due to oversedation and drooling side effect noted by staff - WBC  6.0 and ANC 3200 on 2/3, Troponin 3 on 05/01/21, CRP 1.2 on 05/01/21 - repeat labs due 05/08/21 - Clozaril level 187, NorClozapine 87 (2/5) - QTC 456m on 2/7  - A1c 9.2 and lipids WNL in 11/22 -Continued senokot to 2 tabs daily for Clozaril-induced constipation  -Continued Lithium 450 mg BID for mood stability - Li level 0.67 2/3 - Cr 1.09 - trending closely while on Li and encouraging po hydration - TSH WNL   T2DM with insulin dependence A1C of 9.2. Diabetic coordinator on-board for insulin management -Continued insulin per diabetic coordinator with increase in 70/30 to 52u with breakfast and continue 70/30 35u with supper -Continued SSI moderate -Continued HS coverage -Placed on unit restriction for meals to decrease insulin burden and dietary/nutrition consult ordered   HTN Continue home regimen  -Lisinopril 2381mdaily (reduced due to concern for creatinine) -Amlodipine 10 mg daily -Clonidine 0.2 mg BID -Atenolol 12.81m42maily   Elevated transaminases, stable Repeat 05/01/21: AST 82 and ALT 136; Alk phos 139 - Med consulted, stated that no further work up necessary - Hepatitis panel nonreactive, ammonia 35, negative RUQ u/s 02/10/21 - Will need outpatient f/u after discharge - Trending labs per above  Cognitive impairment MOCA: 13/30  MMSE: 14/30 Pillbox test: Failed, per OT patient is not able to manage his own medication in the next level of care. Patient will need 24/7 caregiver support to assist with IADLs and medication management to be successful in next level of care Concern for ability to live independently and to complete IADLs, currently patient refuses to live in assisted living. APS report accepted.  Follow-up on patient guardianship, s/p APS report accepted Continued Aricept 5 mg qHS for poor memory   DISPO: Pending ACTT 2/13 Monday  Signed: JulMerrily BrittleO Psychiatry Resident, PGY-1 [facility]10/2023, 4:29 PM

## 2021-05-08 NOTE — Progress Notes (Signed)
°   05/08/21 2010  Psych Admission Type (Psych Patients Only)  Admission Status Involuntary  Psychosocial Assessment  Patient Complaints None  Eye Contact Brief  Facial Expression Flat  Affect Preoccupied  Speech Logical/coherent  Interaction Assertive  Motor Activity Other (Comment) (wnl)  Appearance/Hygiene Unremarkable  Behavior Characteristics Cooperative;Appropriate to situation  Mood Preoccupied  Thought Process  Coherency Disorganized  Content WDL  Delusions None reported or observed  Perception Hallucinations  Hallucination None reported or observed  Judgment Limited  Confusion UTA  Danger to Self  Current suicidal ideation? Denies  Danger to Others  Danger to Others None reported or observed   Pt seen in his room. Pt denies SI, HI, AVH and pain. Pt denies anxiety and depression. Pt came to group tonight and was seen in milieu. Pt did not receive BP meds as BP has been low. Provider notified and agrees with plan. Pt denies any other complaints at present.

## 2021-05-09 DIAGNOSIS — F250 Schizoaffective disorder, bipolar type: Secondary | ICD-10-CM | POA: Diagnosis not present

## 2021-05-09 LAB — GLUCOSE, CAPILLARY
Glucose-Capillary: 109 mg/dL — ABNORMAL HIGH (ref 70–99)
Glucose-Capillary: 193 mg/dL — ABNORMAL HIGH (ref 70–99)
Glucose-Capillary: 163 mg/dL — ABNORMAL HIGH (ref 70–99)
Glucose-Capillary: 146 mg/dL — ABNORMAL HIGH (ref 70–99)

## 2021-05-09 LAB — C-REACTIVE PROTEIN: CRP: 2 mg/dL — ABNORMAL HIGH (ref <1.0)

## 2021-05-09 NOTE — [facility] Group Notes (Signed)
Goals Group 05/09/2021   Group Focus: affirmation, clarity of thought, and goals/reality orientation Treatment Modality:  Psychoeducation Interventions utilized were assignment, group exercise, and support Purpose: To be able to understand and verbalize the reason for their admission to the hospital. To understand that the medication helps with their chemical imbalance but they also need to work on their choices in life. To be challenged to develop a list of 30 positives about themselves. Also introduce the concept that "feelings" are not reality.  Participation Level:  did not attend.  Paulino Rily

## 2021-05-09 NOTE — Group Note (Signed)
°  [facility] LCSW Group Therapy Note  Date/Time:  05/09/2021 11:15AM-12:00PM  Type of Therapy and Topic:  Group Therapy:  Feelings About Hospitalization  Participation Level:  None   Description of Group This process group involved patients discussing their feelings related to being hospitalized, as well as the benefits they see to being in the hospital.  These feelings and benefits were itemized.  The group then brainstormed specific ways in which they could seek those same benefits when they discharge and return home.  Therapeutic Goals Patient will identify and describe positive and negative feelings related to hospitalization Patient will verbalize benefits of hospitalization to themselves personally Patients will brainstorm together ways they can obtain similar benefits in the outpatient setting, identify barriers to wellness and possible solutions  Summary of Patient Progress:  The patient did not arrive to the group until the last few minutes.  He sat and nodded off for the last 5 minutes or so.  Therapeutic Modalities Cognitive Behavioral Therapy Motivational Interviewing    Ambrose Mantle, LCSW 05/09/2021, 4:28 PM

## 2021-05-09 NOTE — Progress Notes (Signed)
[facility] MD Progress Note  05/09/2021 5:05 PM Mitchell [name]  MRN:  [id number]  Chief Complaint: delusions and SI  Subjective:   Mitchell [name] is a 61 y.o. male with PPHx of Schizophrenia and multiple hospitalizations, who presented to [facility] via IVC for delusion that he is [name] and threatening to jump into a trash compactor, then admitted Involuntary to [facility] for treatment of schizophrenia exacerbated by medication selective adherence.  Patient was evaluated in patient room on [place]. Patient was disinterested, but more alert and awake during evaluation.  Patient reported feeling the same, despite his brighter affect and more alertness.  He reported stable sleep, mood, appetite.  Continued to endorse delusions of being [name]. Patient denied SI/HI/AVH, delusions, paranoia, first rank symptoms, and contracted to safety on the unit. Patient was not grossly responding to internal/external stimuli during encounter.   Principal Problem: Schizophrenia ([place]) Diagnosis: Principal Problem:   Schizophrenia ([place]) Active Problems:   Hypertension   Type 2 diabetes mellitus ([place])  Total Time Spent in Direct Patient Care: I personally spent 30 minutes on the unit in direct patient care. The direct patient care time included face-to-face time with the patient, reviewing the patient's chart, communicating with other professionals, and coordinating care. Greater than 50% of this time was spent in counseling or coordinating care with the patient regarding goals of hospitalization, psycho-education, and discharge planning needs.   Past Psychiatric History: Past psych diagnoses: Schizophrenia Prior inpatient treatment: [place] ~14x (latest 01/28/2021), [facility] ~2020 Suicide attempts:  Psychiatric med trials: Zyprexa, loxapine, paliperidone injection/156 mg, Geodon Neuromodulation history: N/A Current outpatient psychiatrist:  Current outpatient therapist:  History  of selective adherence: Yes.    Past Medical History:  Past Medical History:  Diagnosis Date   Bipolar affective disorder ([place])    takes Zyprexa daily   Coarse tremors 10/02/2014   Diabetes mellitus    takes Victoza,Metformin,and Glipizide daily   Hypertension    takes Amlodipine,Lisinopril and Clonidine daily   Hyponatremia    history of   Lithium toxicity 10/02/2014   Mental disorder    takes Lithium daily   Schizoaffective disorder    takes Trazodone nightly   Schizoaffective disorder ([place]) 01/04/2019   Seasonal allergies    takes Claritin daily   Sleep apnea    sleep study >2yr ago   Stroke ([facility]    left arm weakness    Past Surgical History:  Procedure Laterality Date   CATARACT EXTRACTION W/PHACO Right 02/14/2013   Procedure: CATARACT EXTRACTION PHACO AND INTRAOCULAR LENS PLACEMENT ([place];  Surgeon: GAdonis Brook MD;  [place] [facility] [place]  Service: Ophthalmology;  Laterality: Right;   CATARACT EXTRACTION W/PHACO Left 06/13/2013   Procedure: CATARACT EXTRACTION PHACO AND INTRAOCULAR LENS PLACEMENT (IOC);  Surgeon: GAdonis Brook MD;  [place] [facility]  Service: Ophthalmology;  Laterality: Left;   CIRCUMCISION  20 yrs. ago   EYE SURGERY     Family History:see H&P  Family Psychiatric History: unable to obtain  Social History:  Social History   Substance and Sexual Activity  Alcohol Use Not Currently     Social History   Substance and Sexual Activity  Drug Use No    Social History   Socioeconomic History   Marital status: Divorced    Spouse name: Not on file   Number of children: Not on file   Years of education: Not on file   Highest education level: Not on file  Occupational  History   Not on file  Tobacco Use   Smoking status: Never   Smokeless tobacco: Never  Vaping Use   Vaping Use: Never used  Substance and Sexual Activity   Alcohol use: Not Currently   Drug use: No   Sexual activity: Yes    Birth control/protection: None  Other Topics Concern   Not  on file  Social History Narrative   Not on file   Social Determinants of Health   Financial Resource Strain: Not on file  Food Insecurity: Not on file  Transportation Needs: Not on file  Physical Activity: Not on file  Stress: Not on file  Social Connections: Not on file   Sleep: Good  Appetite: Good  Current Medications: Current Facility-Administered Medications  Medication Dose Route Frequency Provider Last Rate Last Admin   acetaminophen (TYLENOL) tablet 650 mg  650 mg Oral Q6H PRN Lucky Rathke, FNP       alum & mag hydroxide-simeth (MAALOX/MYLANTA) 200-200-20 MG/5ML suspension 30 mL  30 mL Oral Q4H PRN Lucky Rathke, FNP       amLODipine (NORVASC) tablet 10 mg  10 mg Oral QHS Merrily Brittle, DO   10 mg at 05/07/21 2039   aspirin EC tablet 81 mg  81 mg Oral Daily Lucky Rathke, FNP   81 mg at 05/09/21 3009   atenolol (TENORMIN) tablet 12.5 mg  12.5 mg Oral Daily Lucky Rathke, FNP   12.5 mg at 05/09/21 2330   chewing gum (ORBIT) sugar free  1 Stick Oral TID PRN Merrily Brittle, DO       cloNIDine (CATAPRES) tablet 0.2 mg  0.2 mg Oral BID Lucky Rathke, FNP   0.2 mg at 05/09/21 1639   cloZAPine (CLOZARIL) tablet 75 mg  75 mg Oral QHS Merrily Brittle, DO   75 mg at 05/08/21 2046   donepezil (ARICEPT) tablet 5 mg  5 mg Oral QHS Merrily Brittle, DO   5 mg at 05/08/21 2046   haloperidol (HALDOL) tablet 15 mg  15 mg Oral QHS Viann Fish E, MD   15 mg at 05/08/21 2046   haloperidol (HALDOL) tablet 5 mg  5 mg Oral Daily Viann Fish E, MD   5 mg at 05/09/21 0762   insulin aspart (novoLOG) injection 0-15 Units  0-15 Units Subcutaneous TID WC Merrily Brittle, DO   3 Units at 05/09/21 1226   insulin aspart (novoLOG) injection 0-5 Units  0-5 Units Subcutaneous QHS Merrily Brittle, DO   3 Units at 05/08/21 2048   insulin aspart protamine- aspart (NOVOLOG MIX 70/30) injection 35 Units  35 Units Subcutaneous Q supper Merrily Brittle, DO   35 Units at 05/08/21 1731   insulin aspart protamine- aspart  (NOVOLOG MIX 70/30) injection 52 Units  52 Units Subcutaneous Q breakfast Merrily Brittle, DO   52 Units at 05/09/21 0824   lisinopril (ZESTRIL) tablet 20 mg  20 mg Oral QHS Nelda Marseille, Amy E, MD   20 mg at 05/07/21 2040   lithium carbonate (ESKALITH) CR tablet 450 mg  450 mg Oral Q12H Lucky Rathke, FNP   450 mg at 05/09/21 0822   OLANZapine zydis (ZYPREXA) disintegrating tablet 10 mg  10 mg Oral Q8H PRN Corky Sox, MD       And   LORazepam (ATIVAN) tablet 1 mg  1 mg Oral PRN Corky Sox, MD       And   ziprasidone (GEODON) injection 20 mg  20 mg Intramuscular PRN Alvie Heidelberg,  Merrilee Seashore, MD       magnesium hydroxide (MILK OF MAGNESIA) suspension 30 mL  30 mL Oral Daily PRN Lucky Rathke, FNP       senna (SENOKOT) tablet 17.2 mg  2 tablet Oral Daily Merrily Brittle, DO   17.2 mg at 05/09/21 9381    Lab Results:  Results for orders placed or performed during the hospital encounter of 04/23/21 (from the past 48 hour(s))  Glucose, capillary     Status: Abnormal   Collection Time: 05/07/21  7:42 PM  Result Value Ref Range   Glucose-Capillary 201 (H) 70 - 99 mg/dL    Comment: Glucose reference range applies only to samples taken after fasting for at least 8 hours.   Comment 1 Notify RN    Comment 2 Document in Chart   Glucose, capillary     Status: Abnormal   Collection Time: 05/08/21  5:54 AM  Result Value Ref Range   Glucose-Capillary 171 (H) 70 - 99 mg/dL    Comment: Glucose reference range applies only to samples taken after fasting for at least 8 hours.   Comment 1 Notify RN    Comment 2 Document in Chart   Glucose, capillary     Status: Abnormal   Collection Time: 05/08/21 11:58 AM  Result Value Ref Range   Glucose-Capillary 163 (H) 70 - 99 mg/dL    Comment: Glucose reference range applies only to samples taken after fasting for at least 8 hours.  Glucose, capillary     Status: Abnormal   Collection Time: 05/08/21  5:11 PM  Result Value Ref Range   Glucose-Capillary 245 (H) 70 - 99  mg/dL    Comment: Glucose reference range applies only to samples taken after fasting for at least 8 hours.  CBC with Differential/Platelet     Status: Abnormal   Collection Time: 05/08/21  6:30 PM  Result Value Ref Range   WBC 6.5 4.0 - 10.5 K/uL   RBC 3.85 (L) 4.22 - 5.81 MIL/uL   Hemoglobin 11.8 (L) 13.0 - 17.0 g/dL   HCT 37.0 (L) 39.0 - 52.0 %   MCV 96.1 80.0 - 100.0 fL   MCH 30.6 26.0 - 34.0 pg   MCHC 31.9 30.0 - 36.0 g/dL   RDW 13.2 11.5 - 15.5 %   Platelets 227 150 - 400 K/uL   nRBC 0.0 0.0 - 0.2 %   Neutrophils Relative % 53 %   Neutro Abs 3.5 1.7 - 7.7 K/uL   Lymphocytes Relative 35 %   Lymphs Abs 2.3 0.7 - 4.0 K/uL   Monocytes Relative 7 %   Monocytes Absolute 0.4 0.1 - 1.0 K/uL   Eosinophils Relative 4 %   Eosinophils Absolute 0.3 0.0 - 0.5 K/uL   Basophils Relative 1 %   Basophils Absolute 0.0 0.0 - 0.1 K/uL   Immature Granulocytes 0 %   Abs Immature Granulocytes 0.02 0.00 - 0.07 K/uL    Comment: Performed at [facility], [place] [address] [place], [place] [address]  Comprehensive metabolic panel     Status: Abnormal   Collection Time: 05/08/21  6:30 PM  Result Value Ref Range   Sodium 133 (L) 135 - 145 mmol/L   Potassium 4.5 3.5 - 5.1 mmol/L   Chloride 100 98 - 111 mmol/L   CO2 26 22 - 32 mmol/L   Glucose, Bld 344 (H) 70 - 99 mg/dL    Comment: Glucose reference range applies only to samples taken after fasting  for at least 8 hours.   BUN 28 (H) 6 - 20 mg/dL   Creatinine, Ser 1.28 (H) 0.61 - 1.24 mg/dL   Calcium 8.8 (L) 8.9 - 10.3 mg/dL   Total Protein 6.8 6.5 - 8.1 g/dL   Albumin 3.7 3.5 - 5.0 g/dL   AST 48 (H) 15 - 41 U/L   ALT 75 (H) 0 - 44 U/L   Alkaline Phosphatase 136 (H) 38 - 126 U/L   Total Bilirubin 0.2 (L) 0.3 - 1.2 mg/dL   GFR, Estimated >60 >60 mL/min    Comment: (NOTE) Calculated using the CKD-EPI Creatinine Equation (2021)    Anion gap 7 5 - 15    Comment: Performed at [facility], [place] [address],  [place], [place] [address]  C-reactive protein     Status: Abnormal   Collection Time: 05/08/21  6:30 PM  Result Value Ref Range   CRP 2.0 (H) <1.0 mg/dL    Comment: Performed at [place] [address] [place], [place] [address]  Troponin I (High Sensitivity)     Status: None   Collection Time: 05/08/21  7:05 PM  Result Value Ref Range   Troponin I (High Sensitivity) 3 <18 ng/L    Comment: (NOTE) Elevated high sensitivity troponin I (hsTnI) values and significant  changes across serial measurements may suggest ACS but many other  chronic and acute conditions are known to elevate hsTnI results.  Refer to the "Links" section for chest pain algorithms and additional  guidance. Performed at [facility], [place] [address], [place], [place] [address]   Glucose, capillary     Status: Abnormal   Collection Time: 05/08/21  7:25 PM  Result Value Ref Range   Glucose-Capillary 276 (H) 70 - 99 mg/dL    Comment: Glucose reference range applies only to samples taken after fasting for at least 8 hours.  Glucose, capillary     Status: Abnormal   Collection Time: 05/09/21  5:59 AM  Result Value Ref Range   Glucose-Capillary 109 (H) 70 - 99 mg/dL    Comment: Glucose reference range applies only to samples taken after fasting for at least 8 hours.  Glucose, capillary     Status: Abnormal   Collection Time: 05/09/21 12:11 PM  Result Value Ref Range   Glucose-Capillary 193 (H) 70 - 99 mg/dL    Comment: Glucose reference range applies only to samples taken after fasting for at least 8 hours.  Glucose, capillary     Status: Abnormal   Collection Time: 05/09/21  5:00 PM  Result Value Ref Range   Glucose-Capillary 163 (H) 70 - 99 mg/dL    Comment: Glucose reference range applies only to samples taken after fasting for at least 8 hours.    Blood Alcohol level:  Lab Results  Component Value Date   ETH <10 04/16/2021   ETH <10 41/93/7902    Metabolic Disorder Labs: Lab  Results  Component Value Date   HGBA1C 9.2 (H) 04/24/2021   MPG 217.34 04/24/2021   MPG 271.87 01/26/2021   No results found for: PROLACTIN Lab Results  Component Value Date   CHOL 140 01/30/2021   TRIG 95 01/30/2021   HDL 49 01/30/2021   CHOLHDL 2.9 01/30/2021   VLDL 19 01/30/2021   LDLCALC 72 01/30/2021   LDLCALC 67 05/20/2020   Psychiatric Specialty Exam: Physical Exam Vitals and nursing note reviewed.  Constitutional:      General: He is not in  acute distress.    Appearance: He is not ill-appearing or diaphoretic.  HENT:     Head: Normocephalic.  Pulmonary:     Effort: Pulmonary effort is normal. No respiratory distress.  Neurological:     General: No focal deficit present.     Mental Status: He is alert.  Psychiatric:        Behavior: Behavior is cooperative.     Review of Systems  Respiratory:  Negative for shortness of breath.   Cardiovascular:  Negative for chest pain.  Gastrointestinal:  Negative for nausea and vomiting.  Neurological:  Negative for dizziness and headaches.    Body mass index is 37.42 kg/m. Temp:  [98.1 F (36.7 C)-98.6 F (37 C)] 98.6 F (37 C) (02/11 1638) Pulse Rate:  [79-98] 88 (02/11 1638) Resp:  [18-20] 20 (02/11 0900) BP: (94-147)/(54-90) 138/68 (02/11 1638) SpO2:  [100 %] 100 % (02/11 0900)  General Appearance:  Disheveled (Same pink button up shirt)    Eye Contact:   Poor    Speech:   Normal Rate (Reticent, sometimes mumbling qualities, otherwise coherent)    Volume:   Decreased    Mood:   Dysphoric ("Great")    Affect:   Depressed; Flat; Non-Congruent    Thought Process:  Other (comment); Linear (Impoverish)  Descriptions of Associations: Intact  Duration of Psychotic Symptoms:  Greater than six months  Past Diagnosis of Schizophrenia or Psychoactive disorder:  Yes   Orientation:   Partial (Oriented to year, [facility] [place].  Not president, not month (January))   Thought Content:    Other (comment); Delusions; Illogical (Poverty of thought content.  Delusions of being [name], that his birthday is 04/23/2023, but currently is the year 2023.)  Hallucinations: None Per Nursing patient is responding to internal stimuli, talking out to unseen people.  Ideas of Reference: None   Suicidal Thoughts:   No   Homicidal Thoughts:   No    Memory:   Immediate Fair; Remote Poor; Recent Poor    Judgement:   Fair   Insight:   Shallow    Psychomotor Activity:   Decreased (No tremors)    Concentration:   Fair    Attention Span:   Fair   Recall:   Poor    Fund of Knowledge:   Fair    Language:   Fair    Handed:   Right    Assets:   Leisure Time    Sleep:    Number of Hours: 5  AIMS: Facial and Oral Movements Muscles of Facial Expression: None, normal Lips and Perioral Area: None, normal Jaw: None, normal Tongue: None, normal,Extremity Movements Upper (arms, wrists, hands, fingers): None, normal Lower (legs, knees, ankles, toes): None, normal, Trunk Movements Neck, shoulders, hips: None, normal, Overall Severity Severity of abnormal movements (highest score from questions above): None, normal Incapacitation due to abnormal movements: None, normal Patient's awareness of abnormal movements (rate only patient's report): No Awareness, Dental Status Current problems with teeth and/or dentures?: No Does patient usually wear dentures?: No   CIWA:   COWS:    MOCA: 13/30  MMSE: 14/30 Pillbox test: Failed     Treatment Plan Summary: Daily contact with patient to assess and evaluate symptoms and progress in treatment and Medication management  Safety and Monitoring -- Involuntary admission to inpatient psychiatric unit for safety, stabilization and treatment -- Daily contact with patient to assess and evaluate symptoms and progress in treatment -- Patient's case to be discussed in  multi-disciplinary team meeting -- Observation Level : q15  minute checks -- Vital signs:  q12 hours -- Precautions: suicide  Treatment Assessment & Plan Summary: Principal Problem:   Schizophrenia ([place]) Active Problems:   Hypertension   Type 2 diabetes mellitus ([place])   Schizophrenia by hx -ACTT referral pending -Continued Haldol 5 mg qAM & 15 mg qPM due daytime sedation - goal to titrate down on Haldol over time as he stabilizes on Clozaril -Continue Clozaril 75 mg nightly -Discontinue Clozaril 25 mg daily, for over daytime sedation and no effect to delusions that are fixed. - WBC 6.5 and ANC 3200 on 2/3, Troponin 3 on 05/01/21, CRP 1.2 on 05/01/21 - repeat labs due 05/08/21 - Clozaril level 187, NorClozapine 87 (2/5) - QTC 427m on 2/7  - A1c 9.2 and lipids WNL in 11/22 -Continued senokot to 2 tabs daily for Clozaril-induced constipation  -Continued Lithium 450 mg BID for mood stability - Li level 0.67 2/3 - Cr (!) 1.28 - trending closely while on Li and encouraging po hydration - TSH WNL   T2DM with insulin dependence A1C of 9.2. Diabetic coordinator on-board for insulin management -Continued insulin per diabetic coordinator with increase in 70/30 to 52u with breakfast and continue 70/30 35u with supper -Continued SSI moderate -Continued HS coverage -Placed on unit restriction for meals to decrease insulin burden and dietary/nutrition consult ordered   HTN Continue home regimen  -Lisinopril 252mdaily (reduced due to concern for creatinine) -Amlodipine 10 mg daily -Clonidine 0.2 mg BID -Atenolol 12.52m37maily   Elevated transaminases, stable Repeat 05/01/21: AST 82 and ALT 136; Alk phos 139 - Med consulted, stated that no further work up necessary - Hepatitis panel nonreactive, ammonia 35, negative RUQ u/s 02/10/21 - Will need outpatient f/u after discharge - Trending labs per above  Cognitive impairment MOCA: 13/30  MMSE: 14/30 Pillbox test: Failed, per OT patient is not able to manage his own medication in the next level of  care. Patient will need 24/7 caregiver support to assist with IADLs and medication management to be successful in next level of care Concern for ability to live independently and to complete IADLs, currently patient refuses to live in assisted living. APS report accepted.  Follow-up on patient guardianship, s/p APS report accepted Continued Aricept 5 mg qHS for poor memory   DISPO: Pending ACTT 2/13 Monday  Signed: JulMerrily BrittleO Psychiatry Resident, PGY-1 [facility]11/2023, 5:05 PM

## 2021-05-09 NOTE — Progress Notes (Signed)
[facility] Group Notes:  (Nursing/MHT/Case Management/Adjunct)  Date:  05/09/2021  Time:  2015  Type of Therapy:   wrap up group  Participation Level:  Active  Participation Quality:  Appropriate, Attentive, Sharing, and Supportive  Affect:  Appropriate  Cognitive:  Alert  Insight:  Improving  Engagement in Group:  Engaged  Modes of Intervention:  Clarification, Education, and Support  Summary of Progress/Problems: Positive thinking and positive change were discussed. Pt shared he plans on working on his anger and how he interacts with people. Pt is thankful for his children and the love of God.   Johann Capers S 05/09/2021, 10:03 PM

## 2021-05-10 DIAGNOSIS — F250 Schizoaffective disorder, bipolar type: Secondary | ICD-10-CM | POA: Diagnosis not present

## 2021-05-10 LAB — URINALYSIS, COMPLETE (UACMP) WITH MICROSCOPIC
Bacteria, UA: NONE SEEN
Bilirubin Urine: NEGATIVE
Glucose, UA: NEGATIVE mg/dL
Hgb urine dipstick: NEGATIVE
Ketones, ur: NEGATIVE mg/dL
Leukocytes,Ua: NEGATIVE
Nitrite: NEGATIVE
Protein, ur: NEGATIVE mg/dL
Specific Gravity, Urine: 1.003 — ABNORMAL LOW (ref 1.005–1.030)
pH: 6 (ref 5.0–8.0)

## 2021-05-10 LAB — GLUCOSE, CAPILLARY
Glucose-Capillary: 107 mg/dL — ABNORMAL HIGH (ref 70–99)
Glucose-Capillary: 99 mg/dL (ref 70–99)
Glucose-Capillary: 313 mg/dL — ABNORMAL HIGH (ref 70–99)

## 2021-05-10 NOTE — Progress Notes (Signed)
Patient seen in hallway and writer spoe with him concerning his day. He reports having had a good day and writer asked what happened that caused his blood sugar to be so high when he had been doing so well keeping it in normal range. He smiled and reported that he had 2 ice creams and juice today. He replied " Im not going to do that anymore. Writer praised him for his blood sugar tonight at 149 and encouraged him to be aware of what he is eating that is causing his blood sugar to be elevated. He was receptive to writers advice

## 2021-05-10 NOTE — [facility] Group Notes (Signed)
Spirituality group facilitated by Chaplain Katy Shade Rivenbark, B[facility].   Group Description: Group focused on topic of hope. Patients participated in facilitated discussion around topic, connecting with one another around experiences and definitions for hope. Group members engaged with visual explorer photos, reflecting on what hope looks like for them today. Group engaged in discussion around how their definitions of hope are present today in hospital.   Modalities: Psycho-social ed, Adlerian, Narrative, MI   Patient Progress: Did not attend.  

## 2021-05-10 NOTE — Group Note (Signed)
[facility] LCSW Group Therapy Note ° °Date/Time:  05/10/2021  11:00AM-12:00PM ° °Type of Therapy and Topic:  Group Therapy:  Music and Mood ° °Participation Level:  Did Not Attend  ° °Description of Group: °In this process group, members listened to a variety of genres of music and identified that different types of music evoke different responses.  Patients were encouraged to identify music that was soothing for them and music that was energizing for them.  Patients discussed how this knowledge can help with wellness and recovery in various ways including managing depression and anxiety as well as encouraging healthy sleep habits.   ° °Therapeutic Goals: °Patients will explore the impact of different varieties of music on mood °Patients will verbalize the thoughts they have when listening to different types of music °Patients will identify music that is soothing to them as well as music that is energizing to them °Patients will discuss how to use this knowledge to assist in maintaining wellness and recovery °Patients will explore the use of music as a coping skill ° °Summary of Patient Progress: The patient was invited to group, did not attend. ° °Therapeutic Modalities: °Solution Focused Brief Therapy °Activity ° ° °Triston Skare Grossman-Orr, LCSW °  °

## 2021-05-10 NOTE — Progress Notes (Signed)
Adult Psychoeducational Group Note  Date:  05/10/2021 Time:  8:13 PM  Group Topic/Focus:  Wrap-Up Group:   The focus of this group is to help patients review their daily goal of treatment and discuss progress on daily workbooks.  Participation Level:  Active  Participation Quality:  Appropriate  Affect:  Appropriate  Cognitive:  Appropriate  Insight: Appropriate  Engagement in Group:  Distracting  Modes of Intervention:  Discussion  Additional Comments:  Patient said his day was a 72. He attend groups all day today. He working on discharge date.  Lenice Llamas Long 05/10/2021, 8:13 PM

## 2021-05-10 NOTE — Progress Notes (Signed)
Pt was encouraged but didn't attend orientation/goals group. ?

## 2021-05-10 NOTE — Progress Notes (Signed)
Patient was up in the dayroom for a while tonight. He was compliant with his medications.   05/09/21 2130  Psych Admission Type (Psych Patients Only)  Admission Status Involuntary  Psychosocial Assessment  Patient Complaints None  Eye Contact Brief  Facial Expression Animated  Affect Preoccupied  Speech Logical/coherent  Interaction Assertive  Motor Activity Other (Comment) (wnl)  Appearance/Hygiene Unremarkable  Behavior Characteristics Cooperative;Appropriate to situation  Mood Preoccupied;Pleasant  Aggressive Behavior  Targets  (none)  Type of Behavior  (none)  Effect No apparent injury  Thought Process  Coherency Disorganized  Content Preoccupation  Delusions Grandeur  Perception Hallucinations  Hallucination None reported or observed  Judgment Limited  Confusion None  Danger to Self  Current suicidal ideation? Denies  Danger to Others  Danger to Others None reported or observed

## 2021-05-10 NOTE — Progress Notes (Signed)
Pt denies SI/HI/AVH and verbally agrees to approach staff if these become apparent or before harming themselves/others. Rates depression 0/10. Rates anxiety 0/10. Rates pain 0/10. Pt CBGs were in normal range but then tech gave pt ice cream and CBG shot up. Pt stated that he would not eat anymore. Pt seems to have some more insight on what to eat but still needs redirection. Scheduled medications administered to pt, per MD orders. RN provided support and encouragement to pt. Q15 min safety checks implemented and continued. Pt safe on the unit. RN will continue to monitor and intervene as needed.   05/10/21 0733  Psych Admission Type (Psych Patients Only)  Admission Status Voluntary  Psychosocial Assessment  Patient Complaints None  Eye Contact Brief  Facial Expression Blank;Flat  Affect Flat;Preoccupied  Speech Logical/coherent;Soft  Interaction Assertive  Motor Activity Other (Comment);Slow (WDL)  Appearance/Hygiene Unremarkable  Behavior Characteristics Cooperative;Appropriate to situation;Calm  Mood Pleasant;Preoccupied  Thought Process  Coherency Disorganized  Content Preoccupation  Delusions Grandeur  Perception Hallucinations  Hallucination None reported or observed  Judgment Limited  Confusion None  Danger to Self  Current suicidal ideation? Denies  Danger to Others  Danger to Others None reported or observed

## 2021-05-11 DIAGNOSIS — F250 Schizoaffective disorder, bipolar type: Secondary | ICD-10-CM | POA: Diagnosis not present

## 2021-05-11 LAB — GLUCOSE, CAPILLARY
Glucose-Capillary: 149 mg/dL — ABNORMAL HIGH (ref 70–99)
Glucose-Capillary: 92 mg/dL (ref 70–99)
Glucose-Capillary: 154 mg/dL — ABNORMAL HIGH (ref 70–99)
Glucose-Capillary: 316 mg/dL — ABNORMAL HIGH (ref 70–99)
Glucose-Capillary: 186 mg/dL — ABNORMAL HIGH (ref 70–99)

## 2021-05-11 NOTE — Progress Notes (Signed)
°   05/11/21 1100  Psych Admission Type (Psych Patients Only)  Admission Status Voluntary  Psychosocial Assessment  Eye Contact Brief  Facial Expression Flat  Affect Flat;Preoccupied  Speech Logical/coherent;Soft  Interaction Assertive  Motor Activity Other (Comment);Slow (WDL)  Appearance/Hygiene In hospital gown  Behavior Characteristics Cooperative  Mood Pleasant  Aggressive Behavior  Targets  (none)  Type of Behavior  (none)  Effect No apparent injury  Thought Process  Coherency Disorganized  Content Preoccupation  Delusions Grandeur  Perception Hallucinations  Hallucination None reported or observed  Judgment Limited  Confusion None  Danger to Self  Current suicidal ideation? Denies  Danger to Others  Danger to Others None reported or observed

## 2021-05-11 NOTE — BHH Group Notes (Signed)
Adult Psychoeducational Group Note  Date:  05/11/2021 Time:  9:22 AM  Group Topic/Focus:  Goals Group:   The focus of this group is to help patients establish daily goals to achieve during treatment and discuss how the patient can incorporate goal setting into their daily lives to aide in recovery.  Participation Level:  Did Not Attend   Mitchell [name] 05/11/2021, 9:22 AM

## 2021-05-11 NOTE — BHH Counselor (Signed)
CSW spoke with case worker with [employer] C[facility] [phone]). She stated that this patient is supposed to return to live at apartment. She states that thy have tried multiple times to have DSS become this patients guardian however they have been told that due to him living in an apartment and getting services he would not be assigned a guardian.  She states that people often take advantage of him. He has no children however, someone has taken out child support payments on him.   CST see's this patient 5 days a week and he also receive PSR services weekly.    Ruthann Cancer MSW, LCSW Clincal Social Worker  [facility]

## 2021-05-11 NOTE — BHH Counselor (Signed)
[facility] completed assessment and intake for services with this patient.    Darletta Moll MSW, LCSW Clincal Social Worker  [facility]

## 2021-05-11 NOTE — Group Note (Signed)
Recreation Therapy Group Note   Group Topic:Coping Skills  Group Date: 05/11/2021 Start Time: 1005 End Time: 1035 Facilitators: Caroll Rancher, LRT,CTRS Location:  [address] Dayroom  Goal Area(s) Addresses: Patient will define what a coping skill is. Patient will work to create a list of healthy coping skills beginning with each letter of the alphabet. Patient will successfully identify positive coping skills they can use post d/c.  Patient will acknowledge benefit(s) of using learned coping skills post d/c.  Group Description: Coping A to Z. Patient asked to identify what a coping skill is and when they use them. Patients with Clinical research associate discussed healthy versus unhealthy coping skills. Next patients were given a blank worksheet titled "Coping Skills A-Z".  Partners were instructed to come up with at least one positive coping skill per letter of the alphabet, addressing a specific challenge (ex: stress, anger, anxiety, depression, grief, doubt, isolation, self-harm/suicidal thoughts, substance use). Patients were given 15 minutes to brainstorm before ideas were presented to the large group. Patients and LRT debriefed on the importance of coping skill selection based on situation and back-up plans when a skill tried is not effective.    Affect/Mood: N/A   Participation Level: Did not attend    Clinical Observations/Individualized Feedback:     Plan: Continue to engage patient in RT group sessions 2-3x/week.   Caroll Rancher, LRT,CTRS 05/11/2021 12:21 PM

## 2021-05-11 NOTE — Progress Notes (Signed)
Adult Psychoeducational Group Note  Date:  05/11/2021 Time:  8:45 PM  Group Topic/Focus:  Wrap-Up Group:   The focus of this group is to help patients review their daily goal of treatment and discuss progress on daily workbooks.  Participation Level:  Active  Participation Quality:  Appropriate  Affect:  Appropriate  Cognitive:  Appropriate  Insight: Appropriate  Engagement in Group:  Engaged  Modes of Intervention:  Discussion  Additional Comments:   Pt stated his goal for today was to focus on his treatment plan. Pt stated he accomplished his goal today. Pt stated he talked with his doctor and with his social worker about his care today. Pt rated his overall day a 10. Pt stated his goal was to keep his blood sugars in the 100's. Pt stated he was able to contact a family friend today which improved his overall day. Pt stated he felt better about himself today. Pt stated staff brought back all meals for him today because of his blood sugar issues. Pt stated he took all medications provided today. Pt stated his appetite was pretty good today. Pt rated sleep last night was pretty good. Pt stated the goal tonight was to get some rest. Pt stated he had no physical pain tonight. Pt deny visual hallucinations and auditory issues tonight. Pt denies thoughts of harming himself or others. Pt stated he would alert staff if anything changed  Mitchell [name] 05/11/2021, 8:45 PM

## 2021-05-11 NOTE — Progress Notes (Signed)
[facility] MD Progress Note Late entry for 05/10/21  05/11/2021 7:06 AM Mitchell [name]  MRN:  [id number]  Chief Complaint: delusions and SI  Subjective:   Mitchell [name] is a 61 y.o. male with PPHx of Schizophrenia and multiple hospitalizations, who presented to [facility] via IVC for delusion that he is Mitchell [name] and [name] and threatening to jump into a trash compactor, then admitted Involuntary to [facility] for treatment of schizophrenia exacerbated by medication selective adherence.  Patient was evaluated in patient room on [address]. Patient was awake, but lying down in his room. He had no new physical complaints. He continues to believe he is Mitchell [name], but will answer to Mitchell [name] and Mitchell [name] as well. He also believes he is the president of the [place]. He is not interested in joining peers to watch movies today. No hallucinations. No thoughts of harm to self or others.   Principal Problem: Schizophrenia ([place]) Diagnosis: Principal Problem:   Schizophrenia ([place]) Active Problems:   Hypertension   Type 2 diabetes mellitus ([place])  Total Time Spent in Direct Patient Care: I personally spent 30 minutes on the unit in direct patient care. The direct patient care time included face-to-face time with the patient, reviewing the patient's chart, communicating with other professionals, and coordinating care. Greater than 50% of this time was spent in counseling or coordinating care with the patient regarding goals of hospitalization, psycho-education, and discharge planning needs.   Past Psychiatric History: Past psych diagnoses: Schizophrenia Prior inpatient treatment: [place] ~14x (latest 01/28/2021), [facility] ~2020 Suicide attempts:  Psychiatric med trials: Zyprexa, loxapine, paliperidone injection/156 mg, Geodon Neuromodulation history: N/A Current outpatient psychiatrist:  Current outpatient therapist:  History of selective adherence: Yes.    Past Medical History:  Past  Medical History:  Diagnosis Date   Bipolar affective disorder ([place])    takes Zyprexa daily   Coarse tremors 10/02/2014   Diabetes mellitus    takes Victoza,Metformin,and Glipizide daily   Hypertension    takes Amlodipine,Lisinopril and Clonidine daily   Hyponatremia    history of   Lithium toxicity 10/02/2014   Mental disorder    takes Lithium daily   Schizoaffective disorder    takes Trazodone nightly   Schizoaffective disorder ([place]) 01/04/2019   Seasonal allergies    takes Claritin daily   Sleep apnea    sleep study >85yr ago   Stroke ([facility]    left arm weakness    Past Surgical History:  Procedure Laterality Date   CATARACT EXTRACTION W/PHACO Right 02/14/2013   Procedure: CATARACT EXTRACTION PHACO AND INTRAOCULAR LENS PLACEMENT ([place];  Surgeon: GAdonis Brook MD;  [facility]: [facility]  Service: Ophthalmology;  Laterality: Right;   CATARACT EXTRACTION W/PHACO Left 06/13/2013   Procedure: CATARACT EXTRACTION PHACO AND INTRAOCULAR LENS PLACEMENT ([facility]);  Surgeon: GAdonis Brook MD;  Location: [facility]  Service: Ophthalmology;  Laterality: Left;   CIRCUMCISION  20 yrs. ago   EYE SURGERY     Family History:see H&P  Family Psychiatric History: unable to obtain  Social History:  Social History   Substance and Sexual Activity  Alcohol Use Not Currently     Social History   Substance and Sexual Activity  Drug Use No    Social History   Socioeconomic History   Marital status: Divorced    Spouse name: Not on file   Number of children: Not on file   Years of education: Not on file   Highest education level:  Not on file  Occupational History   Not on file  Tobacco Use   Smoking status: Never   Smokeless tobacco: Never  Vaping Use   Vaping Use: Never used  Substance and Sexual Activity   Alcohol use: Not Currently   Drug use: No   Sexual activity: Yes    Birth control/protection: None  Other Topics Concern   Not on file  Social History Narrative   Not on file   Social  Determinants of Health   Financial Resource Strain: Not on file  Food Insecurity: Not on file  Transportation Needs: Not on file  Physical Activity: Not on file  Stress: Not on file  Social Connections: Not on file   Sleep: Good  Appetite: Good  Current Medications: Current Facility-Administered Medications  Medication Dose Route Frequency Provider Last Rate Last Admin   acetaminophen (TYLENOL) tablet 650 mg  650 mg Oral Q6H PRN Lucky Rathke, FNP       alum & mag hydroxide-simeth (MAALOX/MYLANTA) 200-200-20 MG/5ML suspension 30 mL  30 mL Oral Q4H PRN Lucky Rathke, FNP       amLODipine (NORVASC) tablet 10 mg  10 mg Oral QHS Merrily Brittle, DO   10 mg at 05/10/21 2058   aspirin EC tablet 81 mg  81 mg Oral Daily Lucky Rathke, FNP   81 mg at 05/10/21 0733   atenolol (TENORMIN) tablet 12.5 mg  12.5 mg Oral Daily Lucky Rathke, FNP   12.5 mg at 05/10/21 0734   chewing gum (ORBIT) sugar free  1 Stick Oral TID PRN Merrily Brittle, DO       cloNIDine (CATAPRES) tablet 0.2 mg  0.2 mg Oral BID Lucky Rathke, FNP   0.2 mg at 05/10/21 1637   cloZAPine (CLOZARIL) tablet 75 mg  75 mg Oral QHS Merrily Brittle, DO   75 mg at 05/10/21 2057   donepezil (ARICEPT) tablet 5 mg  5 mg Oral QHS Merrily Brittle, DO   5 mg at 05/10/21 2058   haloperidol (HALDOL) tablet 15 mg  15 mg Oral QHS Viann Fish E, MD   15 mg at 05/10/21 2058   haloperidol (HALDOL) tablet 5 mg  5 mg Oral Daily Nelda Marseille, Amy E, MD   5 mg at 05/10/21 0733   insulin aspart (novoLOG) injection 0-15 Units  0-15 Units Subcutaneous TID WC Merrily Brittle, DO   11 Units at 05/10/21 1647   insulin aspart (novoLOG) injection 0-5 Units  0-5 Units Subcutaneous QHS Merrily Brittle, DO   3 Units at 05/08/21 2048   insulin aspart protamine- aspart (NOVOLOG MIX 70/30) injection 35 Units  35 Units Subcutaneous Q supper Merrily Brittle, DO   35 Units at 05/10/21 1646   insulin aspart protamine- aspart (NOVOLOG MIX 70/30) injection 52 Units  52 Units Subcutaneous  Q breakfast Merrily Brittle, DO   52 Units at 05/10/21 0802   lisinopril (ZESTRIL) tablet 20 mg  20 mg Oral QHS Nelda Marseille, Amy E, MD   20 mg at 05/10/21 2057   lithium carbonate (ESKALITH) CR tablet 450 mg  450 mg Oral Q12H Lucky Rathke, FNP   450 mg at 05/10/21 2058   OLANZapine zydis (ZYPREXA) disintegrating tablet 10 mg  10 mg Oral Q8H PRN Corky Sox, MD       And   LORazepam (ATIVAN) tablet 1 mg  1 mg Oral PRN Corky Sox, MD       And   ziprasidone (GEODON) injection 20 mg  20 mg Intramuscular PRN Corky Sox, MD       magnesium hydroxide (MILK OF MAGNESIA) suspension 30 mL  30 mL Oral Daily PRN Lucky Rathke, FNP       senna (SENOKOT) tablet 17.2 mg  2 tablet Oral Daily Merrily Brittle, DO   17.2 mg at 05/10/21 9983    Lab Results:  Results for orders placed or performed during the hospital encounter of 04/23/21 (from the past 48 hour(s))  Glucose, capillary     Status: Abnormal   Collection Time: 05/09/21 12:11 PM  Result Value Ref Range   Glucose-Capillary 193 (H) 70 - 99 mg/dL    Comment: Glucose reference range applies only to samples taken after fasting for at least 8 hours.  Urinalysis, Complete w Microscopic Urine, Clean Catch     Status: Abnormal   Collection Time: 05/09/21 12:45 PM  Result Value Ref Range   Color, Urine COLORLESS (A) YELLOW   APPearance CLEAR CLEAR   Specific Gravity, Urine 1.003 (L) 1.005 - 1.030   pH 6.0 5.0 - 8.0   Glucose, UA NEGATIVE NEGATIVE mg/dL   Hgb urine dipstick NEGATIVE NEGATIVE   Bilirubin Urine NEGATIVE NEGATIVE   Ketones, ur NEGATIVE NEGATIVE mg/dL   Protein, ur NEGATIVE NEGATIVE mg/dL   Nitrite NEGATIVE NEGATIVE   Leukocytes,Ua NEGATIVE NEGATIVE   RBC / HPF 0-5 0 - 5 RBC/hpf   Bacteria, UA NONE SEEN NONE SEEN   Mucus PRESENT     Comment: Performed at [facility], [place] [address] [place], [place] [address]  Glucose, capillary     Status: Abnormal   Collection Time: 05/09/21  5:00 PM  Result Value Ref  Range   Glucose-Capillary 163 (H) 70 - 99 mg/dL    Comment: Glucose reference range applies only to samples taken after fasting for at least 8 hours.  Glucose, capillary     Status: Abnormal   Collection Time: 05/09/21  8:53 PM  Result Value Ref Range   Glucose-Capillary 146 (H) 70 - 99 mg/dL    Comment: Glucose reference range applies only to samples taken after fasting for at least 8 hours.  Glucose, capillary     Status: Abnormal   Collection Time: 05/10/21  6:39 AM  Result Value Ref Range   Glucose-Capillary 107 (H) 70 - 99 mg/dL    Comment: Glucose reference range applies only to samples taken after fasting for at least 8 hours.  Glucose, capillary     Status: None   Collection Time: 05/10/21 11:52 AM  Result Value Ref Range   Glucose-Capillary 99 70 - 99 mg/dL    Comment: Glucose reference range applies only to samples taken after fasting for at least 8 hours.  Glucose, capillary     Status: Abnormal   Collection Time: 05/10/21  4:42 PM  Result Value Ref Range   Glucose-Capillary 313 (H) 70 - 99 mg/dL    Comment: Glucose reference range applies only to samples taken after fasting for at least 8 hours.  Glucose, capillary     Status: Abnormal   Collection Time: 05/10/21  7:23 PM  Result Value Ref Range   Glucose-Capillary 149 (H) 70 - 99 mg/dL    Comment: Glucose reference range applies only to samples taken after fasting for at least 8 hours.  Glucose, capillary     Status: None   Collection Time: 05/11/21  6:08 AM  Result Value Ref Range   Glucose-Capillary 92 70 - 99 mg/dL  Comment: Glucose reference range applies only to samples taken after fasting for at least 8 hours.    Blood Alcohol level:  Lab Results  Component Value Date   ETH <10 04/16/2021   ETH <10 29/47/6546    Metabolic Disorder Labs: Lab Results  Component Value Date   HGBA1C 9.2 (H) 04/24/2021   MPG 217.34 04/24/2021   MPG 271.87 01/26/2021   No results found for: PROLACTIN Lab Results   Component Value Date   CHOL 140 01/30/2021   TRIG 95 01/30/2021   HDL 49 01/30/2021   CHOLHDL 2.9 01/30/2021   VLDL 19 01/30/2021   LDLCALC 72 01/30/2021   LDLCALC 67 05/20/2020   Psychiatric Specialty Exam: Physical Exam Vitals and nursing note reviewed.  Constitutional:      Appearance: He is not diaphoretic.  HENT:     Head: Normocephalic.  Pulmonary:     Effort: Pulmonary effort is normal. No respiratory distress.  Neurological:     General: No focal deficit present.     Mental Status: He is alert.  Psychiatric:        Behavior: Behavior is cooperative.     Review of Systems  Respiratory:  Negative for shortness of breath.   Cardiovascular:  Negative for chest pain.  Gastrointestinal:  Negative for nausea and vomiting.  Neurological:  Negative for dizziness and headaches.    Body mass index is 37.42 kg/m. Temp:  [98.3 F (36.8 C)-98.4 F (36.9 C)] 98.3 F (36.8 C) (02/13 0620) Pulse Rate:  [77-91] 91 (02/13 0622) Resp:  [16] 16 (02/12 1634) BP: (117-125)/(67-74) 117/72 (02/13 0622) SpO2:  [95 %-100 %] 95 % (02/13 0622)  General Appearance:  Disheveled (Same pink button up shirt)    Eye Contact:   Poor    Speech:   Normal Rate (Reticent, sometimes mumbling qualities, otherwise coherent)    Volume:   Decreased    Mood:   Dysphoric ("Great")    Affect:   Depressed; Flat; Non-Congruent    Thought Process:  Other (comment); Linear (Impoverish)  Descriptions of Associations: Intact  Duration of Psychotic Symptoms:  Greater than six months  Past Diagnosis of Schizophrenia or Psychoactive disorder:  Yes   Orientation:   Partial (Oriented to year, [facility], [place].  Not president, not month (January))   Thought Content:   Other (comment); Delusions; Illogical (Poverty of thought content.  Delusions of being Mitchell [name], that his birthday is 04/23/2023, but currently is the year 2023.)  Hallucinations: None Per Nursing patient is  responding to internal stimuli, talking out to unseen people.  Ideas of Reference: None   Suicidal Thoughts:   No   Homicidal Thoughts:   No    Memory:   Immediate Fair; Remote Poor; Recent Poor    Judgement:   Fair   Insight:   Shallow    Psychomotor Activity:   Decreased (No tremors)    Concentration:   Fair    Attention Span:   Fair   Recall:   Poor    Fund of Knowledge:   Fair    Language:   Fair    Handed:   Right    Assets:   Leisure Time    Sleep:    Number of Hours: 5  AIMS: Facial and Oral Movements Muscles of Facial Expression: None, normal Lips and Perioral Area: None, normal Jaw: None, normal Tongue: None, normal,Extremity Movements Upper (arms, wrists, hands, fingers): None, normal Lower (legs, knees, ankles, toes): None, normal, Trunk  Movements Neck, shoulders, hips: None, normal, Overall Severity Severity of abnormal movements (highest score from questions above): None, normal Incapacitation due to abnormal movements: None, normal Patient's awareness of abnormal movements (rate only patient's report): No Awareness, Dental Status Current problems with teeth and/or dentures?: No Does patient usually wear dentures?: No   CIWA:   COWS:    MOCA: 13/30  MMSE: 14/30 Pillbox test: Failed     Treatment Plan Summary: Daily contact with patient to assess and evaluate symptoms and progress in treatment and Medication management  Safety and Monitoring -- Voluntary admission to inpatient psychiatric unit for safety, stabilization and treatment -- Daily contact with patient to assess and evaluate symptoms and progress in treatment -- Patient's case to be discussed in multi-disciplinary team meeting -- Observation Level : q15 minute checks -- Vital signs:  q12 hours -- Precautions: suicide  Treatment Assessment & Plan Summary: Principal Problem:   Schizophrenia ([place]) Active Problems:   Hypertension   Type 2 diabetes mellitus ([place])    Schizophrenia by hx -ACTT referral pending -Continued Haldol 5 mg qAM & 15 mg qPM due daytime sedation - goal to titrate down on Haldol over time as he stabilizes on Clozaril -Continue Clozaril 75 mg nightly -Discontinue Clozaril 25 mg daily, for over daytime sedation and no effect to delusions that are fixed. - WBC 6.5 and ANC 3200 on 2/3, Troponin 3 on 05/01/21, CRP 1.2 on 05/01/21 - repeat labs due 05/08/21 - Clozaril level 187, NorClozapine 87 (2/5) - QTC 437m on 2/7  - A1c 9.2 and lipids WNL in 11/22 -Continued senokot to 2 tabs daily for Clozaril-induced constipation  -Continued Lithium 450 mg BID for mood stability - Li level 0.67 2/3 - Cr (!) 1.28 - trending closely while on Li and encouraging po hydration - TSH WNL   T2DM with insulin dependence A1C of 9.2. Diabetic coordinator on-board for insulin management -Continued insulin per diabetic coordinator with increase in 70/30 to 52u with breakfast and continue 70/30 35u with supper -Continued SSI moderate -Continued HS coverage -Placed on unit restriction for meals to decrease insulin burden and dietary/nutrition consult ordered   HTN Continue home regimen  -Lisinopril 248mdaily (reduced due to concern for creatinine) -Amlodipine 10 mg daily -Clonidine 0.2 mg BID -Atenolol 12.42m71maily   Elevated transaminases, stable Repeat 05/01/21: AST 82 and ALT 136; Alk phos 139 - Med consulted, stated that no further work up necessary - Hepatitis panel nonreactive, ammonia 35, negative RUQ u/s 02/10/21 - Will need outpatient f/u after discharge - Trending labs per above  Cognitive impairment MOCA: 13/30  MMSE: 14/30 Pillbox test: Failed, per OT patient is not able to manage his own medication in the next level of care. Patient will need 24/7 caregiver support to assist with IADLs and medication management to be successful in next level of care Concern for ability to live independently and to complete IADLs, currently patient  refuses to live in assisted living. APS report accepted.  Follow-up on patient guardianship, s/p APS report accepted Continued Aricept 5 mg qHS for poor memory   DISPO: Pending ACTT 2/13 Monday  Signed: SteMaida SaleD  Con[facility]13/2023, 7:06 AM

## 2021-05-11 NOTE — Progress Notes (Signed)
[facility] MD Progress Note Late entry for 05/10/21  05/11/2021 1:14 PM Mitchell [name]  MRN:  [id number]  Chief Complaint: delusions and SI  Subjective:   Mitchell [name] is a 61 y.o. male with PPHx of Schizophrenia and multiple hospitalizations, who presented to [facility] via IVC for delusion that he is Mitchell [name] and [name] and threatening to jump into a trash compactor, then admitted Involuntary to [facility] for treatment of schizophrenia exacerbated by medication selective adherence.  Patient was evaluated in patient room on [place].  Patient was awake and more alert today compared to over the weekend and has a brighter affect.  He is still not very engaging with interview, but pleasant and cooperative. Patient reported doing "so-so", but cannot complain.  He still endorsing that he is Mitchell [name] in the current president.  He evaded the question about his mood, and minimized it, stating "I will be all right" but that his mood could be better. Patient denied SI/HI/AVH, delusions, first rank symptoms, and contracted to safety on the unit. Patient was not grossly responding to internal/external stimuli during encounter.    Principal Problem: Schizophrenia ([place]) Diagnosis: Principal Problem:   Schizophrenia ([place]) Active Problems:   Hypertension   Type 2 diabetes mellitus ([place])  Total Time Spent in Direct Patient Care: I personally spent 30 minutes on the unit in direct patient care. The direct patient care time included face-to-face time with the patient, reviewing the patient's chart, communicating with other professionals, and coordinating care. Greater than 50% of this time was spent in counseling or coordinating care with the patient regarding goals of hospitalization, psycho-education, and discharge planning needs.   Past Psychiatric History: Past psych diagnoses: Schizophrenia Prior inpatient treatment: [place] [facility] ~14x (latest 01/28/2021), [facility] ~2020 Suicide attempts:   Psychiatric med trials: Zyprexa, loxapine, paliperidone injection/156 mg, Geodon Neuromodulation history: N/A Current outpatient psychiatrist:  Current outpatient therapist:  History of selective adherence: Yes.    Past Medical History:  Past Medical History:  Diagnosis Date   Bipolar affective disorder ([place])    takes Zyprexa daily   Coarse tremors 10/02/2014   Diabetes mellitus    takes Victoza,Metformin,and Glipizide daily   Hypertension    takes Amlodipine,Lisinopril and Clonidine daily   Hyponatremia    history of   Lithium toxicity 10/02/2014   Mental disorder    takes Lithium daily   Schizoaffective disorder    takes Trazodone nightly   Schizoaffective disorder ([place]) 01/04/2019   Seasonal allergies    takes Claritin daily   Sleep apnea    sleep study >72yr ago   Stroke ([facility]    left arm weakness    Past Surgical History:  Procedure Laterality Date   CATARACT EXTRACTION W/PHACO Right 02/14/2013   Procedure: CATARACT EXTRACTION PHACO AND INTRAOCULAR LENS PLACEMENT ([place];  Surgeon: GAdonis Brook MD;  [place]  Service: Ophthalmology;  Laterality: Right;   CATARACT EXTRACTION W/PHACO Left 06/13/2013   Procedure: CATARACT EXTRACTION PHACO AND INTRAOCULAR LENS PLACEMENT (IOC);  Surgeon: GAdonis Brook MD;  [place]  Service: Ophthalmology;  Laterality: Left;   CIRCUMCISION  20 yrs. ago   EYE SURGERY     Family History:see H&P  Family Psychiatric History: unable to obtain  Social History:  Social History   Substance and Sexual Activity  Alcohol Use Not Currently     Social History   Substance and Sexual Activity  Drug Use No    Social History   Socioeconomic  History   Marital status: Divorced    Spouse name: Not on file   Number of children: Not on file   Years of education: Not on file   Highest education level: Not on file  Occupational History   Not on file  Tobacco Use   Smoking status: Never   Smokeless tobacco: Never  Vaping Use    Vaping Use: Never used  Substance and Sexual Activity   Alcohol use: Not Currently   Drug use: No   Sexual activity: Yes    Birth control/protection: None  Other Topics Concern   Not on file  Social History Narrative   Not on file   Social Determinants of Health   Financial Resource Strain: Not on file  Food Insecurity: Not on file  Transportation Needs: Not on file  Physical Activity: Not on file  Stress: Not on file  Social Connections: Not on file   Sleep: Good  Appetite: Good  Current Medications: Current Facility-Administered Medications  Medication Dose Route Frequency Provider Last Rate Last Admin   acetaminophen (TYLENOL) tablet 650 mg  650 mg Oral Q6H PRN Lucky Rathke, FNP       alum & mag hydroxide-simeth (MAALOX/MYLANTA) 200-200-20 MG/5ML suspension 30 mL  30 mL Oral Q4H PRN Lucky Rathke, FNP       amLODipine (NORVASC) tablet 10 mg  10 mg Oral QHS Merrily Brittle, DO   10 mg at 05/10/21 2058   aspirin EC tablet 81 mg  81 mg Oral Daily Lucky Rathke, FNP   81 mg at 05/11/21 0832   atenolol (TENORMIN) tablet 12.5 mg  12.5 mg Oral Daily Lucky Rathke, FNP   12.5 mg at 05/11/21 5638   chewing gum (ORBIT) sugar free  1 Stick Oral TID PRN Merrily Brittle, DO       cloNIDine (CATAPRES) tablet 0.2 mg  0.2 mg Oral BID Lucky Rathke, FNP   0.2 mg at 05/11/21 7564   cloZAPine (CLOZARIL) tablet 75 mg  75 mg Oral QHS Merrily Brittle, DO   75 mg at 05/10/21 2057   donepezil (ARICEPT) tablet 5 mg  5 mg Oral QHS Merrily Brittle, DO   5 mg at 05/10/21 2058   haloperidol (HALDOL) tablet 15 mg  15 mg Oral QHS Viann Fish E, MD   15 mg at 05/10/21 2058   haloperidol (HALDOL) tablet 5 mg  5 mg Oral Daily Nelda Marseille, Amy E, MD   5 mg at 05/11/21 0831   insulin aspart (novoLOG) injection 0-15 Units  0-15 Units Subcutaneous TID WC Merrily Brittle, DO   3 Units at 05/11/21 1242   insulin aspart (novoLOG) injection 0-5 Units  0-5 Units Subcutaneous QHS Merrily Brittle, DO   3 Units at 05/08/21 2048    insulin aspart protamine- aspart (NOVOLOG MIX 70/30) injection 35 Units  35 Units Subcutaneous Q supper Merrily Brittle, DO   35 Units at 05/10/21 1646   insulin aspart protamine- aspart (NOVOLOG MIX 70/30) injection 52 Units  52 Units Subcutaneous Q breakfast Merrily Brittle, DO   52 Units at 05/11/21 0833   lisinopril (ZESTRIL) tablet 20 mg  20 mg Oral QHS Nelda Marseille, Amy E, MD   20 mg at 05/10/21 2057   lithium carbonate (ESKALITH) CR tablet 450 mg  450 mg Oral Q12H Lucky Rathke, FNP   450 mg at 05/11/21 0832   OLANZapine zydis (ZYPREXA) disintegrating tablet 10 mg  10 mg Oral Q8H PRN Corky Sox, MD  And   LORazepam (ATIVAN) tablet 1 mg  1 mg Oral PRN Corky Sox, MD       And   ziprasidone (GEODON) injection 20 mg  20 mg Intramuscular PRN Corky Sox, MD       magnesium hydroxide (MILK OF MAGNESIA) suspension 30 mL  30 mL Oral Daily PRN Lucky Rathke, FNP       senna (SENOKOT) tablet 17.2 mg  2 tablet Oral Daily Merrily Brittle, DO   17.2 mg at 05/11/21 0831    Lab Results:  Results for orders placed or performed during the hospital encounter of 04/23/21 (from the past 48 hour(s))  Glucose, capillary     Status: Abnormal   Collection Time: 05/09/21  5:00 PM  Result Value Ref Range   Glucose-Capillary 163 (H) 70 - 99 mg/dL    Comment: Glucose reference range applies only to samples taken after fasting for at least 8 hours.  Glucose, capillary     Status: Abnormal   Collection Time: 05/09/21  8:53 PM  Result Value Ref Range   Glucose-Capillary 146 (H) 70 - 99 mg/dL    Comment: Glucose reference range applies only to samples taken after fasting for at least 8 hours.  Glucose, capillary     Status: Abnormal   Collection Time: 05/10/21  6:39 AM  Result Value Ref Range   Glucose-Capillary 107 (H) 70 - 99 mg/dL    Comment: Glucose reference range applies only to samples taken after fasting for at least 8 hours.  Glucose, capillary     Status: None   Collection Time:  05/10/21 11:52 AM  Result Value Ref Range   Glucose-Capillary 99 70 - 99 mg/dL    Comment: Glucose reference range applies only to samples taken after fasting for at least 8 hours.  Glucose, capillary     Status: Abnormal   Collection Time: 05/10/21  4:42 PM  Result Value Ref Range   Glucose-Capillary 313 (H) 70 - 99 mg/dL    Comment: Glucose reference range applies only to samples taken after fasting for at least 8 hours.  Glucose, capillary     Status: Abnormal   Collection Time: 05/10/21  7:23 PM  Result Value Ref Range   Glucose-Capillary 149 (H) 70 - 99 mg/dL    Comment: Glucose reference range applies only to samples taken after fasting for at least 8 hours.  Glucose, capillary     Status: None   Collection Time: 05/11/21  6:08 AM  Result Value Ref Range   Glucose-Capillary 92 70 - 99 mg/dL    Comment: Glucose reference range applies only to samples taken after fasting for at least 8 hours.  Glucose, capillary     Status: Abnormal   Collection Time: 05/11/21 12:33 PM  Result Value Ref Range   Glucose-Capillary 154 (H) 70 - 99 mg/dL    Comment: Glucose reference range applies only to samples taken after fasting for at least 8 hours.   Comment 1 Notify RN     Blood Alcohol level:  Lab Results  Component Value Date   ETH <10 04/16/2021   ETH <10 99/83/3825    Metabolic Disorder Labs: Lab Results  Component Value Date   HGBA1C 9.2 (H) 04/24/2021   MPG 217.34 04/24/2021   MPG 271.87 01/26/2021   No results found for: PROLACTIN Lab Results  Component Value Date   CHOL 140 01/30/2021   TRIG 95 01/30/2021   HDL 49 01/30/2021   CHOLHDL 2.9 01/30/2021  VLDL 19 01/30/2021   LDLCALC 72 01/30/2021   LDLCALC 67 05/20/2020   Psychiatric Specialty Exam: Physical Exam Vitals and nursing note reviewed.  Constitutional:      Appearance: He is not diaphoretic.  HENT:     Head: Normocephalic.  Pulmonary:     Effort: Pulmonary effort is normal. No respiratory distress.   Neurological:     General: No focal deficit present.     Mental Status: He is alert.  Psychiatric:        Behavior: Behavior is cooperative.     Review of Systems  Respiratory:  Negative for shortness of breath.   Cardiovascular:  Negative for chest pain.  Gastrointestinal:  Negative for nausea and vomiting.  Neurological:  Negative for dizziness and headaches.    Body mass index is 37.42 kg/m. Temp:  [98.3 F (36.8 C)-98.4 F (36.9 C)] 98.3 F (36.8 C) (02/13 0620) Pulse Rate:  [77-91] 91 (02/13 0622) Resp:  [16] 16 (02/12 1634) BP: (117-125)/(67-74) 117/72 (02/13 0622) SpO2:  [95 %-100 %] 95 % (02/13 0622)  General Appearance:  Disheveled; Casual (New shirt with food stains)    Eye Contact:   Fair    Speech:   Clear and Coherent; Normal Rate (Mumbling at times, although less than before)    Volume:   Decreased    Mood:   Dysphoric    Affect:   Flat; Depressed; Restricted; Congruent (Does brighten up at times during conversation)    Thought Process:  Coherent; Linear; Goal Directed  Descriptions of Associations: Intact  Duration of Psychotic Symptoms:  Greater than six months  Past Diagnosis of Schizophrenia or Psychoactive disorder:  Yes   Orientation:   Partial (Oriented to [place], [place], hospital, month, year, not president)   Thought Content:   Rumination; Delusions (Poverty of thought content. Delusions of being Mitchell [name] and being the president, ruminated on dispo expectations. Denied paranoia, first rank symptoms)  Hallucinations: None Per Nursing patient is responding to internal stimuli, talking out to unseen people.  Ideas of Reference: None   Suicidal Thoughts:   No   Homicidal Thoughts:   No    Memory:   Immediate Fair; Recent Poor; Remote Poor    Judgement:   Fair   Insight:   Shallow    Psychomotor Activity:   Decreased (No tremors)    Concentration:   Fair    Attention Span:   Good   Recall:   Poor    Fund  of Knowledge:   Fair    Language:   Fair    Handed:   Right    Assets:   Leisure Time    Sleep:    Number of Hours: 5  AIMS: Facial and Oral Movements Muscles of Facial Expression: None, normal Lips and Perioral Area: None, normal Jaw: None, normal Tongue: None, normal,Extremity Movements Upper (arms, wrists, hands, fingers): None, normal Lower (legs, knees, ankles, toes): None, normal, Trunk Movements Neck, shoulders, hips: None, normal, Overall Severity Severity of abnormal movements (highest score from questions above): None, normal Incapacitation due to abnormal movements: None, normal Patient's awareness of abnormal movements (rate only patient's report): No Awareness, Dental Status Current problems with teeth and/or dentures?: No Does patient usually wear dentures?: No   AIMS 0 MOCA: 13/30  MMSE: 14/30 Pillbox test: Failed     Treatment Plan Summary: Daily contact with patient to assess and evaluate symptoms and progress in treatment and Medication management  Safety and Monitoring -- Voluntary admission to  inpatient psychiatric unit for safety, stabilization and treatment -- Daily contact with patient to assess and evaluate symptoms and progress in treatment -- Patient's case to be discussed in multi-disciplinary team meeting -- Observation Level : q15 minute checks -- Vital signs:  q12 hours -- Precautions: suicide  Treatment Assessment & Plan Summary: Principal Problem:   Schizophrenia ([place]) Active Problems:   Hypertension   Type 2 diabetes mellitus ([place])   Schizophrenia by hx -Continued Haldol 5 mg qAM & 15 mg qPM for psychosis -Continue Clozaril 75 mg nightly - WBC 6.5 and ANC 3200 on 2/3, Troponin 3 on 05/01/21, CRP 1.2 on 05/01/21 - repeat labs due 05/08/21 - Clozaril level 187, NorClozapine 87 (2/5) - QTC 472m on 2/7  - A1c 9.2 and lipids WNL in 11/22 -Continued senokot to 2 tabs daily for Clozaril-induced constipation  -Continued Lithium 450 mg  BID for mood stability - Li level 0.67 2/3 - Cr (!) 1.28 - trending closely while on Li and encouraging po hydration - TSH WNL   T2DM with insulin dependence A1C of 9.2. Diabetic coordinator on-board for insulin management -Continued insulin per diabetic coordinator with increase in 70/30 to 52u with breakfast and continue 70/30 35u with supper -Continued SSI moderate -Continued HS coverage -Placed on unit restriction for meals to decrease insulin burden and dietary/nutrition consult ordered   HTN Continue home regimen  -Lisinopril 268mdaily (reduced due to concern for creatinine) -Amlodipine 10 mg daily -Clonidine 0.2 mg BID -Atenolol 12.38m70maily   Elevated transaminases, down trending Repeat 05/01/21: AST 82 and ALT 136; Alk phos 139 - Med consulted, stated that no further work up necessary - Hepatitis panel nonreactive, ammonia 35, negative RUQ u/s 02/10/21 - Will need outpatient f/u after discharge - Trending labs per above  Cognitive impairment MOCA: 13/30  MMSE: 14/30 Pillbox test: Failed, per OT patient is not able to manage his own medication in the next level of care. Patient will need 24/7 caregiver support to assist with IADLs and medication management to be successful in next level of care Concern for ability to live independently and to complete IADLs, currently patient refuses to live in assisted living. APS report accepted.  Follow-up on patient guardianship, s/p APS report accepted Continued Aricept 5 mg qHS for poor memory   DISPO: Pending ACTT 2/13 Monday  Signed: JulMerrily BrittleO Psychiatry Resident, PGY-1 ConVeterans Affairs [facility] CampusHAb[facility] Center13/2023, 1:15 PM

## 2021-05-11 NOTE — Progress Notes (Signed)
D:  Nickoli was up and visible on the unit.  He attended evening wrap up group.  He denied SI/HI or AVH.  No episodes noted of talking to himself.  He was noted singing loudly on the phone with someone.  He took his hs medications without difficulty.  No prns requested.  His CBG was 186 requiring no hs coverage.  He denied any pain or discomfort and appeared to be in no physical distress.  Did intercept him trying to get ice cream with his evening snack and educated him on choosing something more appropriate for his carb modified diet.  No prns requested this evening. A:  1:1 with RN for support and encouragement.  Medications given as ordered.  Q 15 minute checks maintained for safety.  Encouraged participation in group and unit activities. R:  He is currently resting with his eyes closed and appears to be asleep.  He remains safe on the unit.  We will continue to monitor the progress towards his goals.

## 2021-05-11 NOTE — Group Note (Signed)
° °  Date/Time: 05/11/2021 @ 1pm  Type of Therapy and Topic:  Group Therapy:  Recognizing Triggers  Participation Level:  Active   Description of Group:   Recognizing Triggers: Patients defined triggers and discussed the importance of recognizing their personal warning signs. Patients identified their own triggers and how they tend to cope with stressful situations. Patients discussed areas such as people, places, things, and thoughts that rigger certain emotions for them. CSW provided support to patients and discussed safety planning for when these triggers occur. Group participants had opportunities to share openly with the group and participate in a group discussion while providing support and feedback to their peers.  Therapeutic Goals: Patient will identify triggers that are contributing to a problem in their life Patient will identify unwanted behaviors and feelings associated with a trigger.  Patient will share with other group members strategies to confront and avoid triggers so that they may be able to react appropriately to triggers in daily life.    Summary of Patient Progress: Patient was disorganized in group at times but was able to be redirected.  He discussed his children as a main motivator and discussed that arguments with certain people are triggering.  Patient discussed that he wanted to work on his self esteem as a way to confront triggers when he is discharged.      Therapeutic Modalities:   Cognitive Behavioral Therapy Solution Focused Therapy Motivational Interviewing Family Systems Approach   Delanee Xin [place], LCSW, LCAS Clincal Social Worker  [facility]

## 2021-05-12 LAB — GLUCOSE, CAPILLARY
Glucose-Capillary: 109 mg/dL — ABNORMAL HIGH (ref 70–99)
Glucose-Capillary: 180 mg/dL — ABNORMAL HIGH (ref 70–99)
Glucose-Capillary: 143 mg/dL — ABNORMAL HIGH (ref 70–99)
Glucose-Capillary: 103 mg/dL — ABNORMAL HIGH (ref 70–99)
Glucose-Capillary: 61 mg/dL — ABNORMAL LOW (ref 70–99)

## 2021-05-12 MED ORDER — WHITE PETROLATUM EX OINT
TOPICAL_OINTMENT | CUTANEOUS | Status: AC
  Administered 2021-05-12: 1
  Filled 2021-05-12: qty 5

## 2021-05-12 NOTE — Progress Notes (Signed)
Adult Psychoeducational Group Note  Date:  05/12/2021 Time:  8:51 PM  Group Topic/Focus:  Wrap-Up Group:   The focus of this group is to help patients review their daily goal of treatment and discuss progress on daily workbooks.  Participation Level:  Active  Participation Quality:  Appropriate  Affect:  Appropriate  Cognitive:  Appropriate  Insight: Appropriate  Engagement in Group:  Engaged  Modes of Intervention:  Discussion  Additional Comments:  Pt stated his goal for today was to focus on his treatment plan. Pt stated he accomplished his goal today. Pt stated he talked with his doctor and with his social worker about his care today. Pt rated his overall day a 10. Pt stated his goal was to keep his blood sugars in the 100's. Pt's blood sugar tonight was(61 and 108) .Pt stated he was able to contact his cousin today which improved his overall day. Pt stated he felt better about himself today. Pt stated staff brought back all meals for him today because of his blood sugar issues. Pt stated he took all medications provided today. Pt stated his appetite was pretty good today. Pt rated sleep last night was pretty good. Pt stated the goal tonight was to get some rest. Pt stated he had no physical pain tonight. Pt deny visual hallucinations and auditory issues tonight. Pt denies thoughts of harming himself or others. Pt stated he would alert staff if anything changed    Candy Sledge 05/12/2021, 8:51 PM

## 2021-05-12 NOTE — Progress Notes (Addendum)
[facility] MD Progress Note   05/12/2021 4:52 PM Mitchell [name]  MRN:  [id number]  Chief Complaint: delusions and SI  Subjective:   Mitchell [name] is a 61 y.o. male with PPHx of Schizophrenia and multiple hospitalizations, who presented to [facility] via IVC for delusion that he is Mitchell [name] and [name] and threatening to jump into a trash compactor, then admitted Involuntary to [facility] for treatment of schizophrenia exacerbated by medication selective adherence.  Overnight Events: No acute events overnight. Patient has been compliant with scheduled meds, no agitation PRN's required, and attended group appropriately.   Patient was evaluated in patient room on [address] with attending.  He was initially seen laying in bed resting midmorning, and was cooperative but disinterested in evaluation.  Patient preferred to be called "Mitchell [name]".  He was alert and oriented to month, year, hospital, city, state, Software engineer.  He reported "great" mood, although affect was blunted.  He reported stable sleep and appetite.  He denied daytime somnolence.  He reported still drooling especially when he is sleeping, but not when he is awake, which has improved with decreasing Clozaril.  Discussed with him that he has PRN gum to assist with that. Patient denied SI/HI/AVH,  paranoia, thought insertion, thought broadcasting, ideas of reference, and contracted for safety on the unit. Patient denied chest pain, shortness of breath, abdominal pain or discomfort, constipation, urinary issues, dizziness, headache, or palpitations.  Principal Problem: Schizophrenia ([place]) Diagnosis: Principal Problem:   Schizophrenia ([place]) Active Problems:   Hypertension   Type 2 diabetes mellitus ([place])  Total Time Spent in Direct Patient Care: I personally spent 30 minutes on the unit in direct patient care. The direct patient care time included face-to-face time with the patient, reviewing the patient's chart, communicating  with other professionals, and coordinating care. Greater than 50% of this time was spent in counseling or coordinating care with the patient regarding goals of hospitalization, psycho-education, and discharge planning needs.   Past Psychiatric History: Past psych diagnoses: Schizophrenia Prior inpatient treatment: [facility] ~14x (latest 01/28/2021), [facility] ~2020 Suicide attempts:  Psychiatric med trials: Zyprexa, loxapine, paliperidone injection/156 mg, Geodon Neuromodulation history: N/A Current outpatient psychiatrist:  Current outpatient therapist:  History of selective adherence: Yes.    Past Medical History:  Past Medical History:  Diagnosis Date   Bipolar affective disorder ([place])    takes Zyprexa daily   Coarse tremors 10/02/2014   Diabetes mellitus    takes Victoza,Metformin,and Glipizide daily   Hypertension    takes Amlodipine,Lisinopril and Clonidine daily   Hyponatremia    history of   Lithium toxicity 10/02/2014   Mental disorder    takes Lithium daily   Schizoaffective disorder    takes Trazodone nightly   Schizoaffective disorder ([place]) 01/04/2019   Seasonal allergies    takes Claritin daily   Sleep apnea    sleep study >61yr ago   Stroke ([facility]    left arm weakness    Past Surgical History:  Procedure Laterality Date   CATARACT EXTRACTION W/PHACO Right 02/14/2013   Procedure: CATARACT EXTRACTION PHACO AND INTRAOCULAR LENS PLACEMENT [place];  Surgeon: GAdonis Brook MD;  [place]  Service: Ophthalmology;  Laterality: Right;   CATARACT EXTRACTION W/PHACO Left 06/13/2013   Procedure: CATARACT EXTRACTION PHACO AND INTRAOCULAR LENS PLACEMENT (IOC);  Surgeon: GAdonis Brook MD;  [place]  Service: Ophthalmology;  Laterality: Left;   CIRCUMCISION  20 yrs. ago   EYE SURGERY  Family History:see H&P  Family Psychiatric History: unable to obtain  Social History:  Social History   Substance and Sexual Activity  Alcohol Use Not Currently     Social  History   Substance and Sexual Activity  Drug Use No    Social History   Socioeconomic History   Marital status: Divorced    Spouse name: Not on file   Number of children: Not on file   Years of education: Not on file   Highest education level: Not on file  Occupational History   Not on file  Tobacco Use   Smoking status: Never   Smokeless tobacco: Never  Vaping Use   Vaping Use: Never used  Substance and Sexual Activity   Alcohol use: Not Currently   Drug use: No   Sexual activity: Yes    Birth control/protection: None  Other Topics Concern   Not on file  Social History Narrative   Not on file   Social Determinants of Health   Financial Resource Strain: Not on file  Food Insecurity: Not on file  Transportation Needs: Not on file  Physical Activity: Not on file  Stress: Not on file  Social Connections: Not on file   Sleep: Good  Appetite: Good  Current Medications: Current Facility-Administered Medications  Medication Dose Route Frequency Provider Last Rate Last Admin   acetaminophen (TYLENOL) tablet 650 mg  650 mg Oral Q6H PRN Lucky Rathke, FNP       alum & mag hydroxide-simeth (MAALOX/MYLANTA) 200-200-20 MG/5ML suspension 30 mL  30 mL Oral Q4H PRN Lucky Rathke, FNP       amLODipine (NORVASC) tablet 10 mg  10 mg Oral QHS Merrily Brittle, DO   10 mg at 05/11/21 2053   aspirin EC tablet 81 mg  81 mg Oral Daily Lucky Rathke, FNP   81 mg at 05/12/21 0831   atenolol (TENORMIN) tablet 12.5 mg  12.5 mg Oral Daily Lucky Rathke, FNP   12.5 mg at 05/12/21 0831   chewing gum (ORBIT) sugar free  1 Stick Oral TID PRN Merrily Brittle, DO       cloNIDine (CATAPRES) tablet 0.2 mg  0.2 mg Oral BID Lucky Rathke, FNP   0.2 mg at 05/12/21 0831   cloZAPine (CLOZARIL) tablet 75 mg  75 mg Oral QHS Merrily Brittle, DO   75 mg at 05/11/21 2052   donepezil (ARICEPT) tablet 5 mg  5 mg Oral QHS Merrily Brittle, DO   5 mg at 05/11/21 2053   haloperidol (HALDOL) tablet 15 mg  15 mg Oral QHS  Nelda Marseille, Henley Boettner E, MD   15 mg at 05/11/21 2053   haloperidol (HALDOL) tablet 5 mg  5 mg Oral Daily Nelda Marseille, Xia Stohr E, MD   5 mg at 05/12/21 0831   insulin aspart (novoLOG) injection 0-15 Units  0-15 Units Subcutaneous TID WC Merrily Brittle, DO   3 Units at 05/12/21 1230   insulin aspart (novoLOG) injection 0-5 Units  0-5 Units Subcutaneous QHS Merrily Brittle, DO   3 Units at 05/08/21 2048   insulin aspart protamine- aspart (NOVOLOG MIX 70/30) injection 35 Units  35 Units Subcutaneous Q supper Merrily Brittle, DO   35 Units at 05/11/21 1742   insulin aspart protamine- aspart (NOVOLOG MIX 70/30) injection 52 Units  52 Units Subcutaneous Q breakfast Merrily Brittle, DO   52 Units at 05/12/21 1004   lisinopril (ZESTRIL) tablet 20 mg  20 mg Oral QHS Harlow Asa, MD  20 mg at 05/11/21 2053   lithium carbonate (ESKALITH) CR tablet 450 mg  450 mg Oral Q12H Lucky Rathke, FNP   450 mg at 05/12/21 0832   OLANZapine zydis (ZYPREXA) disintegrating tablet 10 mg  10 mg Oral Q8H PRN Corky Sox, MD       And   LORazepam (ATIVAN) tablet 1 mg  1 mg Oral PRN Corky Sox, MD       And   ziprasidone (GEODON) injection 20 mg  20 mg Intramuscular PRN Corky Sox, MD       magnesium hydroxide (MILK OF MAGNESIA) suspension 30 mL  30 mL Oral Daily PRN Lucky Rathke, FNP       senna (SENOKOT) tablet 17.2 mg  2 tablet Oral Daily Merrily Brittle, DO   17.2 mg at 05/12/21 0831    Lab Results:  Results for orders placed or performed during the hospital encounter of 04/23/21 (from the past 48 hour(s))  Glucose, capillary     Status: Abnormal   Collection Time: 05/10/21  7:23 PM  Result Value Ref Range   Glucose-Capillary 149 (H) 70 - 99 mg/dL    Comment: Glucose reference range applies only to samples taken after fasting for at least 8 hours.  Glucose, capillary     Status: None   Collection Time: 05/11/21  6:08 AM  Result Value Ref Range   Glucose-Capillary 92 70 - 99 mg/dL    Comment: Glucose reference range  applies only to samples taken after fasting for at least 8 hours.  Glucose, capillary     Status: Abnormal   Collection Time: 05/11/21 12:33 PM  Result Value Ref Range   Glucose-Capillary 154 (H) 70 - 99 mg/dL    Comment: Glucose reference range applies only to samples taken after fasting for at least 8 hours.   Comment 1 Notify RN   Glucose, capillary     Status: Abnormal   Collection Time: 05/11/21  5:35 PM  Result Value Ref Range   Glucose-Capillary 316 (H) 70 - 99 mg/dL    Comment: Glucose reference range applies only to samples taken after fasting for at least 8 hours.   Comment 1 Notify RN   Glucose, capillary     Status: Abnormal   Collection Time: 05/11/21  7:50 PM  Result Value Ref Range   Glucose-Capillary 186 (H) 70 - 99 mg/dL    Comment: Glucose reference range applies only to samples taken after fasting for at least 8 hours.  Glucose, capillary     Status: Abnormal   Collection Time: 05/12/21  5:25 AM  Result Value Ref Range   Glucose-Capillary 109 (H) 70 - 99 mg/dL    Comment: Glucose reference range applies only to samples taken after fasting for at least 8 hours.  Glucose, capillary     Status: Abnormal   Collection Time: 05/12/21 11:56 AM  Result Value Ref Range   Glucose-Capillary 180 (H) 70 - 99 mg/dL    Comment: Glucose reference range applies only to samples taken after fasting for at least 8 hours.    Blood Alcohol level:  Lab Results  Component Value Date   ETH <10 04/16/2021   ETH <10 89/38/1017    Metabolic Disorder Labs: Lab Results  Component Value Date   HGBA1C 9.2 (H) 04/24/2021   MPG 217.34 04/24/2021   MPG 271.87 01/26/2021   No results found for: PROLACTIN Lab Results  Component Value Date   CHOL 140 01/30/2021   TRIG 95  01/30/2021   HDL 49 01/30/2021   CHOLHDL 2.9 01/30/2021   VLDL 19 01/30/2021   LDLCALC 72 01/30/2021   LDLCALC 67 05/20/2020   Psychiatric Specialty Exam: Physical Exam Vitals and nursing note reviewed.   Constitutional:      Appearance: He is not diaphoretic.  HENT:     Head: Normocephalic.  Pulmonary:     Effort: Pulmonary effort is normal. No respiratory distress.  Neurological:     General: No focal deficit present.     Mental Status: He is alert.  Psychiatric:        Behavior: Behavior is cooperative.     Review of Systems  Respiratory:  Negative for shortness of breath.   Cardiovascular:  Negative for chest pain.  Gastrointestinal:  Negative for nausea and vomiting.  Neurological:  Negative for dizziness and headaches.    Body mass index is 37.42 kg/m. Temp:  [98.1 F (36.7 C)-98.3 F (36.8 C)] 98.1 F (36.7 C) (02/14 1614) Pulse Rate:  [79-92] 79 (02/14 1614) Resp:  [18] 18 (02/14 1614) BP: (112-138)/(63-92) 131/79 (02/14 1614) SpO2:  [98 %-100 %] 99 % (02/14 1614)  General Appearance:  Appropriate for Environment; Casual (Mildly stained shirt)    Eye Contact:   Minimal (Mostly staring at the ceiling)    Speech:   Clear and Coherent; Normal Rate (Irritable tone)    Volume:   Normal    Mood:   Dysphoric (Reported "great", although appeared dysphoric)    Affect:   Blunt    Thought Process:  Concrete, more linear  Duration of Psychotic Symptoms:  Greater than six months  Past Diagnosis of Schizophrenia or Psychoactive disorder:  Yes   Orientation:   Oriented to month, year, city, state, Software engineer but not self or situation   Thought Content:   Delusions (Delusions that he is "[name]".  Denied paranoia, ideas of reference, thought insertion, thought broadcasting)  Hallucinations: None (Denied AVH) Per Nursing patient is responding to internal stimuli, talking out to unseen people.  Ideas of Reference: None   Suicidal Thoughts:   No   Homicidal Thoughts:   No    Memory:   Fair for immediate recall     Judgement:   Poor    Insight:   Poor    Psychomotor Activity:   Normal (No tremors or gait instability)    Concentration:    Fair    Attention Span:   Fair   Recall:   [employer] of Knowledge:   Limited    Language:   Fair    Handed:   Right    Assets:   Leisure Time    Sleep:    Total time unrecorded  AIMS: Facial and Oral Movements Muscles of Facial Expression: None, normal Lips and Perioral Area: None, normal Jaw: None, normal Tongue: None, normal,Extremity Movements Upper (arms, wrists, hands, fingers): None, normal Lower (legs, knees, ankles, toes): None, normal, Trunk Movements Neck, shoulders, hips: None, normal, Overall Severity Severity of abnormal movements (highest score from questions above): None, normal Incapacitation due to abnormal movements: None, normal Patient's awareness of abnormal movements (rate only patient's report): No Awareness, Dental Status Current problems with teeth and/or dentures?: No Does patient usually wear dentures?: No   AIMS 0 (2/13) MOCA: 13/30  MMSE: 14/30 Pillbox test: Failed     Treatment Plan Summary: Daily contact with patient to assess and evaluate symptoms and progress in treatment and Medication management  Safety and Monitoring -- Voluntary  admission to inpatient psychiatric unit for safety, stabilization and treatment -- Daily contact with patient to assess and evaluate symptoms and progress in treatment -- Patient's case to be discussed in multi-disciplinary team meeting -- Observation Level : q15 minute checks -- Vital signs:  q12 hours -- Precautions: suicide  Treatment Assessment & Plan Summary: Principal Problem:   Schizophrenia ([place]) Active Problems:   Hypertension   Type 2 diabetes mellitus ([place])   Schizophrenia by hx -Continued Haldol 5 mg qAM & 15 mg qPM for psychosis -Continued Clozaril 75 mg nightly - WBC 6.5, ANC 3.5, Troponin 3, CRP 2 (2/10) - Clozaril level 187, NorClozapine 87 (2/5) - QTC 468m on (2/7) - will repeat 05/15/20 - A1c 9.2 and lipids WNL in 11/22 - Continued senokot to 2 tabs daily for  Clozaril-induced constipation  -Continued Lithium 450 mg BID for mood stability - Li level 0.67 2/3 - Cr (!) 1.28 - trending while on Lithium and po hydration encouraged - repeating on 2/17 - TSH WNL - ACTT referral made   T2DM with insulin dependence A1C of 9.2. Diabetic coordinator on-board for insulin management -Continued insulin per diabetic coordinator with increase in 70/30 to 52u with breakfast and continue 70/30 35u with supper -Continued SSI moderate -Continued HS coverage -Placed on unit restriction for meals to decrease insulin burden and dietary/nutrition consult ordered   HTN Continue home regimen  -Lisinopril 220mdaily (reduced due to concern for creatinine) -Amlodipine 10 mg daily -Clonidine 0.2 mg BID -Atenolol 12.32m41maily  Elevated transaminases, down trending Repeat 05/01/21: AST 82 and ALT 136; Alk phos 139. (2/10) AST/ALT 48/75, Alk phos 136 - Med consulted, stated that no further work up necessary - Hepatitis panel nonreactive, ammonia 35, negative RUQ u/s 02/10/21 - Will need outpatient f/u after discharge - Trending labs per above  Hyponatremia and Elevated Creatinine - Rechecking CMP 05/15/21  Cognitive impairment MOCA: 13/30  MMSE: 14/30 Pillbox test: Failed, per OT patient is not able to manage his own medication in the next level of care. Patient will need 24/7 caregiver support to assist with IADLs and medication management to be successful in next level of care Concern for ability to live independently and to complete IADLs and manage his insulin regimen and medications- currently patient refuses to live in assisted living. APS report accepted and guardianship recommended by medical providers Follow-up on patient guardianship, s/p APS report accepted Continued Aricept 5 mg qHS for poor memory    Signed: JulMerrily BrittleO Psychiatry Resident, PGY-1 [facility]14/2023, 4:52 PM

## 2021-05-12 NOTE — B[facility] Group Notes (Signed)
Orientation Goals group and PsychoEducational skills. Patients were given daily orientation of schedule and unit and ward rules. Patients were also given self inventory sheet and asked to address concerns with unit RN and MHT. Patients were then read poem by Jacquelyne Balint- '' [place] [employer] [place]'' and asked to identify ways in which anxiety had affected them and the decisions they make. Patients were then given positive lists of healthy coping skills. Pt participated but was minimally participatory and distracted during group.

## 2021-05-12 NOTE — BHH Counselor (Signed)
CSW called [facility] and was told that patients case worker Ms. Adia would return CSW's call.     Darletta Moll MSW, LCSW Clincal Social Worker  [facility]

## 2021-05-12 NOTE — BHH Counselor (Signed)
CSW spoke with APS case worker, Ms. Currie [place]  [phone]) who shared that there are no recommendations for this patient at this time. She states they are still gathering information around this patients case.    Darletta Moll MSW, LCSW Clincal Social Worker  [facility]

## 2021-05-13 ENCOUNTER — Encounter (HOSPITAL_COMMUNITY): Payer: Self-pay

## 2021-05-13 LAB — GLUCOSE, CAPILLARY
Glucose-Capillary: 101 mg/dL — ABNORMAL HIGH (ref 70–99)
Glucose-Capillary: 182 mg/dL — ABNORMAL HIGH (ref 70–99)
Glucose-Capillary: 112 mg/dL — ABNORMAL HIGH (ref 70–99)
Glucose-Capillary: 112 mg/dL — ABNORMAL HIGH (ref 70–99)

## 2021-05-13 MED ORDER — INSULIN ASPART 100 UNIT/ML IJ SOLN
0.0000 [IU] | Freq: Every day | INTRAMUSCULAR | Status: DC
  Administered 2021-05-14: 3 [IU] via SUBCUTANEOUS
  Administered 2021-05-15: 4 [IU] via SUBCUTANEOUS
  Administered 2021-05-17: 2 [IU] via SUBCUTANEOUS
  Administered 2021-05-18: 4 [IU] via SUBCUTANEOUS
  Administered 2021-05-20 – 2021-05-25 (×2): 2 [IU] via SUBCUTANEOUS

## 2021-05-13 MED ORDER — INSULIN ASPART 100 UNIT/ML IJ SOLN
0.0000 [IU] | Freq: Three times a day (TID) | INTRAMUSCULAR | Status: DC
  Administered 2021-05-14: 2 [IU] via SUBCUTANEOUS
  Administered 2021-05-14: 3 [IU] via SUBCUTANEOUS
  Administered 2021-05-15: 5 [IU] via SUBCUTANEOUS
  Administered 2021-05-15: 1 [IU] via SUBCUTANEOUS
  Administered 2021-05-16: 3 [IU] via SUBCUTANEOUS
  Administered 2021-05-17: 2 [IU] via SUBCUTANEOUS
  Administered 2021-05-17 (×2): 1 [IU] via SUBCUTANEOUS
  Administered 2021-05-18: 2 [IU] via SUBCUTANEOUS
  Administered 2021-05-18: 3 [IU] via SUBCUTANEOUS
  Administered 2021-05-19: 2 [IU] via SUBCUTANEOUS
  Administered 2021-05-19: 1 [IU] via SUBCUTANEOUS
  Administered 2021-05-20 – 2021-05-21 (×2): 2 [IU] via SUBCUTANEOUS
  Administered 2021-05-22 (×2): 3 [IU] via SUBCUTANEOUS
  Administered 2021-05-23: 1 [IU] via SUBCUTANEOUS
  Administered 2021-05-23: 2 [IU] via SUBCUTANEOUS
  Administered 2021-05-24 – 2021-05-25 (×2): 3 [IU] via SUBCUTANEOUS
  Administered 2021-05-25: 5 [IU] via SUBCUTANEOUS
  Administered 2021-05-26 – 2021-05-27 (×3): 2 [IU] via SUBCUTANEOUS
  Administered 2021-05-27: 3 [IU] via SUBCUTANEOUS

## 2021-05-13 NOTE — BH IP Treatment Plan (Signed)
Interdisciplinary Treatment and Diagnostic Plan Update  05/13/2021 Time of Session: 10:15am  Mitchell [name] MRN: [id number]  Principal Diagnosis: Schizophrenia [facility])  Secondary Diagnoses: Principal Problem:   Schizophrenia (HCC) Active Problems:   Hypertension   Type 2 diabetes mellitus (HCC)   Current Medications:  Current Facility-Administered Medications  Medication Dose Route Frequency Provider Last Rate Last Admin   acetaminophen (TYLENOL) tablet 650 mg  650 mg Oral Q6H PRN Lenard Lance, FNP       alum & mag hydroxide-simeth (MAALOX/MYLANTA) 200-200-20 MG/5ML suspension 30 mL  30 mL Oral Q4H PRN Lenard Lance, FNP       amLODipine (NORVASC) tablet 10 mg  10 mg Oral QHS Princess Bruins, DO   10 mg at 05/12/21 2056   aspirin EC tablet 81 mg  81 mg Oral Daily Lenard Lance, FNP   81 mg at 05/13/21 1007   atenolol (TENORMIN) tablet 12.5 mg  12.5 mg Oral Daily Lenard Lance, FNP   12.5 mg at 05/13/21 1006   chewing gum (ORBIT) sugar free  1 Stick Oral TID PRN Princess Bruins, DO       cloNIDine (CATAPRES) tablet 0.2 mg  0.2 mg Oral BID Lenard Lance, FNP   0.2 mg at 05/13/21 1008   cloZAPine (CLOZARIL) tablet 75 mg  75 mg Oral QHS Princess Bruins, DO   75 mg at 05/12/21 2055   donepezil (ARICEPT) tablet 5 mg  5 mg Oral QHS Princess Bruins, DO   5 mg at 05/12/21 2057   haloperidol (HALDOL) tablet 15 mg  15 mg Oral QHS Bartholomew Crews E, MD   15 mg at 05/12/21 2055   haloperidol (HALDOL) tablet 5 mg  5 mg Oral Daily Mason Jim, Amy E, MD   5 mg at 05/13/21 1007   insulin aspart (novoLOG) injection 0-5 Units  0-5 Units Subcutaneous QHS Singleton, Amy E, MD       insulin aspart (novoLOG) injection 0-9 Units  0-9 Units Subcutaneous TID WC Singleton, Amy E, MD       insulin aspart protamine- aspart (NOVOLOG MIX 70/30) injection 35 Units  35 Units Subcutaneous Q supper Princess Bruins, DO   35 Units at 05/12/21 1716   insulin aspart protamine- aspart (NOVOLOG MIX 70/30) injection 52 Units  52 Units  Subcutaneous Q breakfast Princess Bruins, DO   52 Units at 05/13/21 1012   lisinopril (ZESTRIL) tablet 20 mg  20 mg Oral QHS Mason Jim, Amy E, MD   20 mg at 05/12/21 2055   lithium carbonate (ESKALITH) CR tablet 450 mg  450 mg Oral Q12H Lenard Lance, FNP   450 mg at 05/13/21 1003   OLANZapine zydis (ZYPREXA) disintegrating tablet 10 mg  10 mg Oral Q8H PRN Carlyn Reichert, MD       And   LORazepam (ATIVAN) tablet 1 mg  1 mg Oral PRN Carlyn Reichert, MD       And   ziprasidone (GEODON) injection 20 mg  20 mg Intramuscular PRN Carlyn Reichert, MD       magnesium hydroxide (MILK OF MAGNESIA) suspension 30 mL  30 mL Oral Daily PRN Lenard Lance, FNP       senna (SENOKOT) tablet 17.2 mg  2 tablet Oral Daily Princess Bruins, DO   17.2 mg at 05/13/21 1003   PTA Medications: Medications Prior to Admission  Medication Sig Dispense Refill Last Dose   amLODipine (NORVASC) 10 MG tablet Take 1 tablet (10 mg total) by mouth  daily. (Patient not taking: Reported on 04/16/2021) 90 tablet 1    aspirin 81 MG EC tablet Take 1 tablet (81 mg total) by mouth daily. Swallow whole. (Patient not taking: Reported on 04/16/2021) 30 tablet 12    atenolol (TENORMIN) 25 MG tablet Take 0.5 tablets (12.5 mg total) by mouth daily. (Patient not taking: Reported on 04/16/2021) 15 tablet 0    benztropine (COGENTIN) 1 MG tablet Take 1 tablet (1 mg total) by mouth 2 (two) times daily. 60 tablet 2    cloNIDine (CATAPRES) 0.2 MG tablet Take 0.2 mg by mouth 2 (two) times daily. (Patient not taking: Reported on 01/26/2021)      cloZAPine (CLOZARIL) 50 MG tablet Take 1 tablet (50 mg total) by mouth 2 (two) times daily. 60 tablet 0    glipiZIDE (GLUCOTROL XL) 10 MG 24 hr tablet Take 2 tablets (20 mg total) by mouth daily with breakfast. 60 tablet 0    haloperidol (HALDOL) 20 MG tablet Take 1 tablet (20 mg total) by mouth at bedtime. (Patient not taking: Reported on 01/26/2021) 90 tablet 1    insulin aspart (NOVOLOG) 100 UNIT/ML injection Inject  0-9 Units into the skin 3 (three) times daily with meals. 10 mL 11    insulin aspart protamine - aspart (NOVOLOG MIX 70/30 FLEXPEN) (70-30) 100 UNIT/ML FlexPen Inject 48 Units into the skin daily with breakfast. 15 mL 0    insulin aspart protamine- aspart (NOVOLOG MIX 70/30) (70-30) 100 UNIT/ML injection Inject 0.42 mLs (42 Units total) into the skin daily with supper. 10 mL 11    lisinopril (ZESTRIL) 40 MG tablet Take 1 tablet (40 mg total) by mouth daily. (Patient not taking: Reported on 01/11/2020) 90 tablet 1    lithium carbonate (ESKALITH) 450 MG CR tablet Take 1 tablet (450 mg total) by mouth every 12 (twelve) hours. (Patient not taking: Reported on 01/26/2021) 60 tablet 2    traZODone (DESYREL) 50 MG tablet Take 1 tablet (50 mg total) by mouth at bedtime as needed for sleep.       Patient Stressors: Marital or family conflict   Medication change or noncompliance    Patient Strengths: Automotive engineer for treatment/growth   Treatment Modalities: Medication Management, Group therapy, Case management,  1 to 1 session with clinician, Psychoeducation, Recreational therapy.   Physician Treatment Plan for Primary Diagnosis: Schizophrenia (HCC) Long Term Goal(s):     Short Term Goals:    Medication Management: Evaluate patient's response, side effects, and tolerance of medication regimen.  Therapeutic Interventions: 1 to 1 sessions, Unit Group sessions and Medication administration.  Evaluation of Outcomes: Progressing  Physician Treatment Plan for Secondary Diagnosis: Principal Problem:   Schizophrenia (HCC) Active Problems:   Hypertension   Type 2 diabetes mellitus (HCC)  Long Term Goal(s):     Short Term Goals:       Medication Management: Evaluate patient's response, side effects, and tolerance of medication regimen.  Therapeutic Interventions: 1 to 1 sessions, Unit Group sessions and Medication administration.  Evaluation of Outcomes:  Progressing   RN Treatment Plan for Primary Diagnosis: Schizophrenia (HCC) Long Term Goal(s): Knowledge of disease and therapeutic regimen to maintain health will improve  Short Term Goals: Ability to remain free from injury will improve, Ability to participate in decision making will improve, Ability to verbalize feelings will improve, Ability to disclose and discuss suicidal ideas, and Ability to identify and develop effective coping behaviors will improve  Medication Management: RN will administer medications  as ordered by provider, will assess and evaluate patient's response and provide education to patient for prescribed medication. RN will report any adverse and/or side effects to prescribing provider.  Therapeutic Interventions: 1 on 1 counseling sessions, Psychoeducation, Medication administration, Evaluate responses to treatment, Monitor vital signs and CBGs as ordered, Perform/monitor CIWA, COWS, AIMS and Fall Risk screenings as ordered, Perform wound care treatments as ordered.  Evaluation of Outcomes: Progressing   LCSW Treatment Plan for Primary Diagnosis: Schizophrenia (HCC) Long Term Goal(s): Safe transition to appropriate next level of care at discharge, Engage patient in therapeutic group addressing interpersonal concerns.  Short Term Goals: Engage patient in aftercare planning with referrals and resources, Increase social support, Increase emotional regulation, Facilitate acceptance of mental health diagnosis and concerns, Identify triggers associated with mental health/substance abuse issues, and Increase skills for wellness and recovery  Therapeutic Interventions: Assess for all discharge needs, 1 to 1 time with Social worker, Explore available resources and support systems, Assess for adequacy in community support network, Educate family and significant other(s) on suicide prevention, Complete Psychosocial Assessment, Interpersonal group therapy.  Evaluation of Outcomes:  Progressing   Progress in Treatment: Attending groups: Yes. Participating in groups: Yes. Taking medication as prescribed: Yes. Toleration medication: Yes. Family/Significant other contact made: Yes, individual(s) contacted:  attempted to contact Karsten Ro. Number is not active Patient understands diagnosis: No. Discussing patient identified problems/goals with staff: Yes. Medical problems stabilized or resolved: Yes. Denies suicidal/homicidal ideation: Yes. Issues/concerns per patient self-inventory: No. Other: None   New problem(s) identified: No, Describe:  None   New Short Term/Long Term Goal(s):medication stabilization, elimination of SI thoughts, development of comprehensive mental wellness plan.    Patient Goals:  "to sit down and mind my business and think about my triggers and songs."   Discharge Plan or Barriers: Pt will f/u with [facility] for services. APS has been contacted to see if pt is in need of guardian.    Reason for Continuation of Hospitalization: Medication stabilization   Estimated Length of Stay: 3-5 days   Scribe for Treatment Team: Catha Brow 05/13/2021 3:50 PM

## 2021-05-13 NOTE — Progress Notes (Signed)
D:  Jammy was up and visible on the unit.  He did attend evening wrap up group.  He continued to state that he was [name].'  He was however pleasant and denied any complaints.  He denied SI/HI or AVH.  He was not seen talking to himself this evening.  His CBG was initially 61 without symptoms.  He was given soda and CBG did increase to 103.  Snack provided for bedtime.  He took his hs medications without difficulty.  A:  1:1 with RN for support and encouragement.  Medications given as ordered. No prn's given at this time.  Q 15 minute checks maintained for safety.  Encouraged participation in group and unit activities.   R:  He is currently resting with his eyes closed and appears to be asleep.  He remains safe on the unit.  We will continue to monitor the progress towards his goals.

## 2021-05-13 NOTE — Group Note (Signed)
[facility] LCSW Group Therapy Note   Group Date: 05/13/2021 Start Time: 1300 End Time: 1400   Type of Therapy/Topic:  Group Therapy:  Emotion Regulation  Participation Level:  Did Not Attend    Description of Group:    The purpose of this group is to assist patients in learning to regulate negative emotions and experience positive emotions. Patients will be guided to discuss ways in which they have been vulnerable to their negative emotions. These vulnerabilities will be juxtaposed with experiences of positive emotions or situations, and patients challenged to use positive emotions to combat negative ones. Special emphasis will be placed on coping with negative emotions in conflict situations, and patients will process healthy conflict resolution skills.  Therapeutic Goals: Patient will identify two positive emotions or experiences to reflect on in order to balance out negative emotions:  Patient will label two or more emotions that they find the most difficult to experience:  Patient will be able to demonstrate positive conflict resolution skills through discussion or role plays:   Summary of Patient Progress: Did not attend    Therapeutic Modalities:   Cognitive Behavioral Therapy Feelings Identification Dialectical Behavioral Therapy   Otelia Santee, LCSW

## 2021-05-13 NOTE — Progress Notes (Addendum)
[facility] MD Progress Note   05/13/2021 8:21 AM [name]  MRN:  [id number]  Chief Complaint: delusions and SI  Reason for Admission: TIMO HARTWIG is a 61 y.o. male with PPHx of Schizophrenia and multiple hospitalizations, who presented to [facility] via IVC for delusion that he is [name] with threats to jump into a trash compactor. He was then admitted Involuntary to [facility] for treatment of schizophrenia exacerbated by medication selective adherence.  Chart Review from last 24 hours:  The patient's chart was reviewed and nursing notes were reviewed. The patient's case was discussed in multidisciplinary team meeting. Per nursing, he attended some groups and had no acute behavioral issues. He continues to have delusions that he is [name]. He had a low blood sugar of 61 which responded to soda and snack. Per [facility] he was compliant with scheduled medications and did not require PRNs.  Information Obtained Today During Patient Interview: The patient was seen and evaluated on the unit. On assessment today the patient reports that he has been sleeping and eating well.  He voices no physical complaints and specifically denies issues with drooling, constipation, palpitations, shortness of breath, chest pain or urinary issues while on Clozaril.  He denies AVH, paranoia, ideas of reference or first rank symptoms.  He continues to be delusional that he is "[name]" and when asked who Naftali Carchi is, he states he "heard about Keyion Knack on the news who has been going around killing people."  He was encouraged to attend to his ADLs and to go to group.  When attempting to discuss his medication regimen he could not engage in a meaningful discussion.  He continues to show no insight into his need for diabetic management or current need for psychotropic medications.  When questioned as to the reason for admission he continues to give illogical answers and does not appear  to have any insight into his mental health issues.  Principal Problem: Schizophrenia ([place]) Diagnosis: Principal Problem:   Schizophrenia ([place]) Active Problems:   Hypertension   Type 2 diabetes mellitus ([place])  Total Time Spent in Direct Patient Care: I personally spent 30 minutes on the unit in direct patient care. The direct patient care time included face-to-face time with the patient, reviewing the patient's chart, communicating with other professionals, and coordinating care. Greater than 50% of this time was spent in counseling or coordinating care with the patient regarding goals of hospitalization, psycho-education, and discharge planning needs.  Past Psychiatric History: see H&P  Past Medical History:  Past Medical History:  Diagnosis Date   Bipolar affective disorder ([place])    takes Zyprexa daily   Coarse tremors 10/02/2014   Diabetes mellitus    takes Victoza,Metformin,and Glipizide daily   Hypertension    takes Amlodipine,Lisinopril and Clonidine daily   Hyponatremia    history of   Lithium toxicity 10/02/2014   Mental disorder    takes Lithium daily   Schizoaffective disorder    takes Trazodone nightly   Schizoaffective disorder ([place]) 01/04/2019   Seasonal allergies    takes Claritin daily   Sleep apnea    sleep study >50yr ago   Stroke ([facility]    left arm weakness    Past Surgical History:  Procedure Laterality Date   CATARACT EXTRACTION W/PHACO Right 02/14/2013   Procedure: CATARACT EXTRACTION PHACO AND INTRAOCULAR LENS PLACEMENT ([place];  Surgeon: GAdonis Brook MD;  [place]  Service: Ophthalmology;  Laterality:  Right;   CATARACT EXTRACTION W/PHACO Left 06/13/2013   Procedure: CATARACT EXTRACTION PHACO AND INTRAOCULAR LENS PLACEMENT (IOC);  Surgeon: Adonis Brook, MD;  Location: [place];  Service: Ophthalmology;  Laterality: Left;   CIRCUMCISION  20 yrs. ago   EYE SURGERY     Family History:see H&P  Family Psychiatric History: unable to obtain  Social  History:  Social History   Substance and Sexual Activity  Alcohol Use Not Currently     Social History   Substance and Sexual Activity  Drug Use No    Social History   Socioeconomic History   Marital status: Divorced    Spouse name: Not on file   Number of children: Not on file   Years of education: Not on file   Highest education level: Not on file  Occupational History   Not on file  Tobacco Use   Smoking status: Never   Smokeless tobacco: Never  Vaping Use   Vaping Use: Never used  Substance and Sexual Activity   Alcohol use: Not Currently   Drug use: No   Sexual activity: Yes    Birth control/protection: None  Other Topics Concern   Not on file  Social History Narrative   Not on file   Social Determinants of Health   Financial Resource Strain: Not on file  Food Insecurity: Not on file  Transportation Needs: Not on file  Physical Activity: Not on file  Stress: Not on file  Social Connections: Not on file   Sleep: Good  Appetite: Good  Current Medications: Current Facility-Administered Medications  Medication Dose Route Frequency Provider Last Rate Last Admin   acetaminophen (TYLENOL) tablet 650 mg  650 mg Oral Q6H PRN Lucky Rathke, FNP       alum & mag hydroxide-simeth (MAALOX/MYLANTA) 200-200-20 MG/5ML suspension 30 mL  30 mL Oral Q4H PRN Lucky Rathke, FNP       amLODipine (NORVASC) tablet 10 mg  10 mg Oral QHS Merrily Brittle, DO   10 mg at 05/12/21 2056   aspirin EC tablet 81 mg  81 mg Oral Daily Lucky Rathke, FNP   81 mg at 05/12/21 0831   atenolol (TENORMIN) tablet 12.5 mg  12.5 mg Oral Daily Lucky Rathke, FNP   12.5 mg at 05/12/21 0831   chewing gum (ORBIT) sugar free  1 Stick Oral TID PRN Merrily Brittle, DO       cloNIDine (CATAPRES) tablet 0.2 mg  0.2 mg Oral BID Lucky Rathke, FNP   0.2 mg at 05/12/21 1713   cloZAPine (CLOZARIL) tablet 75 mg  75 mg Oral QHS Merrily Brittle, DO   75 mg at 05/12/21 2055   donepezil (ARICEPT) tablet 5 mg  5 mg Oral  QHS Merrily Brittle, DO   5 mg at 05/12/21 2057   haloperidol (HALDOL) tablet 15 mg  15 mg Oral QHS Viann Fish E, MD   15 mg at 05/12/21 2055   haloperidol (HALDOL) tablet 5 mg  5 mg Oral Daily Nelda Marseille, Quashawn Jewkes E, MD   5 mg at 05/12/21 0831   insulin aspart (novoLOG) injection 0-15 Units  0-15 Units Subcutaneous TID WC Merrily Brittle, DO   2 Units at 05/12/21 1715   insulin aspart (novoLOG) injection 0-5 Units  0-5 Units Subcutaneous QHS Merrily Brittle, DO   3 Units at 05/08/21 2048   insulin aspart protamine- aspart (NOVOLOG MIX 70/30) injection 35 Units  35 Units Subcutaneous Q supper Merrily Brittle, DO   35  Units at 05/12/21 1716   insulin aspart protamine- aspart (NOVOLOG MIX 70/30) injection 52 Units  52 Units Subcutaneous Q breakfast Merrily Brittle, DO   52 Units at 05/12/21 1004   lisinopril (ZESTRIL) tablet 20 mg  20 mg Oral QHS Nelda Marseille, Jayron Maqueda E, MD   20 mg at 05/12/21 2055   lithium carbonate (ESKALITH) CR tablet 450 mg  450 mg Oral Q12H Lucky Rathke, FNP   450 mg at 05/12/21 2056   OLANZapine zydis (ZYPREXA) disintegrating tablet 10 mg  10 mg Oral Q8H PRN Corky Sox, MD       And   LORazepam (ATIVAN) tablet 1 mg  1 mg Oral PRN Corky Sox, MD       And   ziprasidone (GEODON) injection 20 mg  20 mg Intramuscular PRN Corky Sox, MD       magnesium hydroxide (MILK OF MAGNESIA) suspension 30 mL  30 mL Oral Daily PRN Lucky Rathke, FNP       senna (SENOKOT) tablet 17.2 mg  2 tablet Oral Daily Merrily Brittle, DO   17.2 mg at 05/12/21 0831    Lab Results:  Results for orders placed or performed during the hospital encounter of 04/23/21 (from the past 48 hour(s))  Glucose, capillary     Status: Abnormal   Collection Time: 05/11/21 12:33 PM  Result Value Ref Range   Glucose-Capillary 154 (H) 70 - 99 mg/dL    Comment: Glucose reference range applies only to samples taken after fasting for at least 8 hours.   Comment 1 Notify RN   Glucose, capillary     Status: Abnormal    Collection Time: 05/11/21  5:35 PM  Result Value Ref Range   Glucose-Capillary 316 (H) 70 - 99 mg/dL    Comment: Glucose reference range applies only to samples taken after fasting for at least 8 hours.   Comment 1 Notify RN   Glucose, capillary     Status: Abnormal   Collection Time: 05/11/21  7:50 PM  Result Value Ref Range   Glucose-Capillary 186 (H) 70 - 99 mg/dL    Comment: Glucose reference range applies only to samples taken after fasting for at least 8 hours.  Glucose, capillary     Status: Abnormal   Collection Time: 05/12/21  5:25 AM  Result Value Ref Range   Glucose-Capillary 109 (H) 70 - 99 mg/dL    Comment: Glucose reference range applies only to samples taken after fasting for at least 8 hours.  Glucose, capillary     Status: Abnormal   Collection Time: 05/12/21 11:56 AM  Result Value Ref Range   Glucose-Capillary 180 (H) 70 - 99 mg/dL    Comment: Glucose reference range applies only to samples taken after fasting for at least 8 hours.  Glucose, capillary     Status: Abnormal   Collection Time: 05/12/21  5:04 PM  Result Value Ref Range   Glucose-Capillary 143 (H) 70 - 99 mg/dL    Comment: Glucose reference range applies only to samples taken after fasting for at least 8 hours.  Glucose, capillary     Status: Abnormal   Collection Time: 05/12/21  7:44 PM  Result Value Ref Range   Glucose-Capillary 61 (L) 70 - 99 mg/dL    Comment: Glucose reference range applies only to samples taken after fasting for at least 8 hours.  Glucose, capillary     Status: Abnormal   Collection Time: 05/12/21  8:13 PM  Result Value Ref Range  Glucose-Capillary 103 (H) 70 - 99 mg/dL    Comment: Glucose reference range applies only to samples taken after fasting for at least 8 hours.  Glucose, capillary     Status: Abnormal   Collection Time: 05/13/21  5:49 AM  Result Value Ref Range   Glucose-Capillary 101 (H) 70 - 99 mg/dL    Comment: Glucose reference range applies only to samples taken  after fasting for at least 8 hours.    Blood Alcohol level:  Lab Results  Component Value Date   ETH <10 04/16/2021   ETH <10 31/51/7616    Metabolic Disorder Labs: Lab Results  Component Value Date   HGBA1C 9.2 (H) 04/24/2021   MPG 217.34 04/24/2021   MPG 271.87 01/26/2021   No results found for: PROLACTIN Lab Results  Component Value Date   CHOL 140 01/30/2021   TRIG 95 01/30/2021   HDL 49 01/30/2021   CHOLHDL 2.9 01/30/2021   VLDL 19 01/30/2021   LDLCALC 72 01/30/2021   LDLCALC 67 05/20/2020   Psychiatric Specialty Exam: Physical Exam Vitals and nursing note reviewed.  Constitutional:      Appearance: He is not diaphoretic.  HENT:     Head: Normocephalic.  Pulmonary:     Effort: Pulmonary effort is normal. No respiratory distress.  Neurological:     General: No focal deficit present.     Mental Status: He is alert.  Psychiatric:        Behavior: Behavior is cooperative.     Review of Systems  Respiratory:  Negative for shortness of breath.   Cardiovascular:  Negative for chest pain.  Gastrointestinal:  Negative for nausea and vomiting.  Neurological:  Negative for dizziness and headaches.    Body mass index is 37.42 kg/m. Temp:  [98.1 F (36.7 C)-98.2 F (36.8 C)] 98.2 F (36.8 C) (02/15 0611) Pulse Rate:  [74-81] 74 (02/15 0612) Resp:  [18] 18 (02/14 1614) BP: (92-131)/(49-79) 92/49 (02/15 0612) SpO2:  [99 %-100 %] 100 % (02/15 0611)  General Appearance:  Casually dressed, obese, fair grooming    Eye Contact:   Minimal    Speech:   Clear and coherent, rambling at times    Volume:   Normal    Mood:   Described as "good" - appears mildly irritable    Affect:   irritable    Thought Process:  Concrete but linear  Duration of Psychotic Symptoms:  Greater than six months  Past Diagnosis of Schizophrenia or Psychoactive disorder:  Yes   Orientation:   Oriented to month, year, city, but not self or situation   Thought Content:   Has  residual fixed delusion that he is "[name]" but denies AVH, paranoia, ideas of reference or first rank symptoms.  He is not grossly responding to internal or external stimuli on exam.  He denies SI or HI.  He gives illogical reasoning as to why he is admitted to the hospital  Hallucinations: Denied  Ideas of Reference: None   Suicidal Thoughts:   No   Homicidal Thoughts:   No    Memory:   Immediate - fair; remote poor    Judgement:   Poor    Insight:   Poor    Psychomotor Activity:   Normal - no evidence of akathisias or tremors    Concentration:   Poor    Attention Span:   Poor   Recall:   Poor    Fund of Knowledge:   Limited  Language:   Fair    Assets:   Leisure Time    Sleep:    7 hours  AIMS: Facial and Oral Movements Muscles of Facial Expression: None, normal Lips and Perioral Area: None, normal Jaw: None, normal Tongue: None, normal,Extremity Movements Upper (arms, wrists, hands, fingers): None, normal Lower (legs, knees, ankles, toes): None, normal, Trunk Movements Neck, shoulders, hips: None, normal, Overall Severity Severity of abnormal movements (highest score from questions above): None, normal Incapacitation due to abnormal movements: None, normal Patient's awareness of abnormal movements (rate only patient's report): No Awareness, Dental Status Current problems with teeth and/or dentures?: No Does patient usually wear dentures?: No   AIMS 0 (2/13) MOCA: 13/30  MMSE: 14/30 Pillbox test: Failed    ASSESSMENT: Diagnoses / Active Problems: Schizophrenia by hx Type II DM HTN Hyponatremia Elevated Creatinine High risk medication use Elevated transaminases Anemia Cognitive impairment (r/o major neurocognitive d/o)  PLAN: Safety and Monitoring:  -- Voluntary admission to inpatient psychiatric unit for safety, stabilization and treatment  -- Daily contact with patient to assess and evaluate symptoms and progress in  treatment  -- Patient's case to be discussed in multi-disciplinary team meeting  -- Observation Level : q15 minute checks  -- Vital signs:  q12 hours  -- Precautions: suicide, elopement, and assault  2. Psychiatric Diagnoses and Treatment:    Schizophrenia by hx -Continued Haldol 5 mg qAM & 15 mg qPM for psychosis -Continued Clozaril 75 mg nightly for psychosis - WBC 6.5, ANC 3.5, Troponin 3, CRP 2 (2/10) - next labs due 2/17 - Clozaril level 187, NorClozapine 87, and total Cloz + Nor-cloz level 273 (2/5) - QTC 441m on (2/7) - will repeat 05/15/20 - A1c 9.2 (1/27) and lipids WNL in 11/22 - Continue senokot 2 tabs daily for Clozaril-induced constipation  -Continue Lithium 450 mg BID for mood stability - Li level 0.67 (2/3) - Cr (!) 1.28 (2/10)- trending while on Lithium and po hydration encouraged - repeating on 2/17 - TSH 2.233 (1/31) - ACTT referral made  Cognitive impairment (r/o major neurocognitive d/o) MOCA: 13/30  MMSE: 14/30 Pillbox test: Failed (per OT patient is not able to manage his own medication. Patient will need 24/7 caregiver support to assist with IADLs and medication management to be successful)  - Ongoing concern for ability to live independently and to complete IADLs and manage his insulin regimen and medications- currently patient refuses to live in assisted living. APS report accepted and guardianship recommended by medical providers - Follow-up on patient guardianship recommendations - Continued Aricept 5 mg qHS for poor memory    3. Medical Issues Being Addressed:   T2DM with insulin dependence - A1C of 9.2. Diabetic coordinator on-board for insulin management -Continued insulin per diabetic coordinator with 70/30 52u with breakfast and continue 70/30 35u with supper  - Change SSI to sensitive coverage per recommendations of diabetic coordinator - Continued HS coverage -Placed on unit restriction for meals to monitor portions and carb choices    HTN -Lisinopril 268mdaily (reduced due to concern for creatinine) -Amlodipine 10 mg daily -Clonidine 0.2 mg BID -Atenolol 12.109m33maily  Elevated transaminases, down trending - AST 48 and ALT 75 (2/10); Alk phos 136 (2/10) - Med consulted, stated that no further work up necessary - Hepatitis panel nonreactive, ammonia 35, negative RUQ u/s 02/10/21 - Will need outpatient f/u after discharge  Mild Hyponatremia - Na+ 133 - rechecking CMP 05/15/21  Elevated Creatinine - 1.28 (2/10) - Rechecking CMP 05/15/21 and encouraging  po hydration  Anemia (H/H 11.8/37) - Checking Ferritin, TIBC/iron, folate, and B12 on 2/17  4. Discharge Planning:   -- Social work and case management to assist with discharge planning and identification of hospital follow-up needs prior to discharge  -- Discharge Concerns: Need to establish safe housing option given his need for assistance with ADLs and medications - need for guardianship and potential ALF/group home referral; ACTT referral  -- Discharge Goals: Return home with outpatient referrals for mental health follow-up including medication management/psychotherapy   Aquita Simmering Artis Delay, MD, [facility]

## 2021-05-13 NOTE — Progress Notes (Signed)
°   05/13/21 1115  Psych Admission Type (Psych Patients Only)  Admission Status Voluntary  Psychosocial Assessment  Patient Complaints None  Eye Contact Fair  Facial Expression Flat  Affect Appropriate to circumstance  Speech Logical/coherent  Interaction Assertive  Motor Activity Slow  Appearance/Hygiene Unremarkable  Behavior Characteristics Cooperative;Calm  Mood Preoccupied;Pleasant  Thought Process  Coherency Tangential  Content Preoccupation  Delusions Grandeur  Perception Hallucinations  Hallucination None reported or observed  Judgment Limited  Confusion None  Danger to Self  Current suicidal ideation? Denies  Danger to Others  Danger to Others None reported or observed

## 2021-05-13 NOTE — Plan of Care (Signed)
  Problem: Activity: Goal: Interest or engagement in activities will improve Outcome: Progressing   Problem: Coping: Goal: Ability to verbalize frustrations and anger appropriately will improve Outcome: Progressing   Problem: Coping: Goal: Ability to demonstrate self-control will improve Outcome: Progressing   Problem: Safety: Goal: Periods of time without injury will increase Outcome: Progressing   

## 2021-05-13 NOTE — BHH Counselor (Addendum)
Per CST this patients payee is Radiation protection practitioner with [employer] [phone].     Ruthann Cancer MSW, LCSW Clincal Social Worker  [facility]

## 2021-05-13 NOTE — Progress Notes (Signed)
Hypoglycemic Event  CBG: 61  Treatment: 8 oz juice/soda  Symptoms: None   Follow-up CBG: Time: 2013 CBG Result:103  Possible Reasons for Event: Unknown  Comments/MD notified: No changes at this time.    Levin Bacon

## 2021-05-13 NOTE — BHH Group Notes (Signed)
Patient did not attend morning orientation/goal.

## 2021-05-13 NOTE — Progress Notes (Signed)
Inpatient Diabetes Program Recommendations  AACE/ADA: New Consensus Statement on Inpatient Glycemic Control (2015)  Target Ranges:  Prepandial:   less than 140 mg/dL      Peak postprandial:   less than 180 mg/dL (1-2 hours)      Critically ill patients:  140 - 180 mg/dL   Lab Results  Component Value Date   GLUCAP 101 (H) 05/13/2021   HGBA1C 9.2 (H) 04/24/2021    Review of Glycemic Control  Latest Reference Range & Units 05/12/21 05:25 05/12/21 11:56 05/12/21 17:04 05/12/21 19:44 05/12/21 20:13 05/13/21 05:49  Glucose-Capillary 70 - 99 mg/dL 109 (H) 180 (H) 143 (H) 61 (L) 103 (H) 101 (H)   Diabetes history: DM 2 Outpatient Diabetes medications: 70/30 48 units with BF and 42 with supper,  Glipizide 20 qd Current orders for Inpatient glycemic control:  70/30  52 units qam, 35 units qpm Novolog 0-15 units TID  A1c 9.2% on 1/27  Inpatient Diabetes Program Recommendations:    Hypoglycemia 61 at 1944 after Novolog 2 units and 70/30 35 units  - Reduce Novolog Correction to 0-9 units tid + hs scale  Thanks,  Tama Headings RN, MSN, BC-ADM Inpatient Diabetes Coordinator Team Pager [phone] (8a-5p)

## 2021-05-13 NOTE — BH IP Treatment Plan (Addendum)
Interdisciplinary Treatment and Diagnostic Plan Update  05/13/2021 Time of Session: 10:15am  DONNA SILVERMAN MRN: [id number]  Principal Diagnosis: Schizophrenia [facility])  Secondary Diagnoses: Principal Problem:   Schizophrenia (HCC) Active Problems:   Hypertension   Type 2 diabetes mellitus (HCC)   Current Medications:  Current Facility-Administered Medications  Medication Dose Route Frequency Provider Last Rate Last Admin   acetaminophen (TYLENOL) tablet 650 mg  650 mg Oral Q6H PRN Lenard Lance, FNP       alum & mag hydroxide-simeth (MAALOX/MYLANTA) 200-200-20 MG/5ML suspension 30 mL  30 mL Oral Q4H PRN Lenard Lance, FNP       amLODipine (NORVASC) tablet 10 mg  10 mg Oral QHS Princess Bruins, DO   10 mg at 05/12/21 2056   aspirin EC tablet 81 mg  81 mg Oral Daily Lenard Lance, FNP   81 mg at 05/13/21 1007   atenolol (TENORMIN) tablet 12.5 mg  12.5 mg Oral Daily Lenard Lance, FNP   12.5 mg at 05/13/21 1006   chewing gum (ORBIT) sugar free  1 Stick Oral TID PRN Princess Bruins, DO       cloNIDine (CATAPRES) tablet 0.2 mg  0.2 mg Oral BID Lenard Lance, FNP   0.2 mg at 05/13/21 1008   cloZAPine (CLOZARIL) tablet 75 mg  75 mg Oral QHS Princess Bruins, DO   75 mg at 05/12/21 2055   donepezil (ARICEPT) tablet 5 mg  5 mg Oral QHS Princess Bruins, DO   5 mg at 05/12/21 2057   haloperidol (HALDOL) tablet 15 mg  15 mg Oral QHS Bartholomew Crews E, MD   15 mg at 05/12/21 2055   haloperidol (HALDOL) tablet 5 mg  5 mg Oral Daily Mason Jim, Amy E, MD   5 mg at 05/13/21 1007   insulin aspart (novoLOG) injection 0-5 Units  0-5 Units Subcutaneous QHS Singleton, Amy E, MD       insulin aspart (novoLOG) injection 0-9 Units  0-9 Units Subcutaneous TID WC Singleton, Amy E, MD       insulin aspart protamine- aspart (NOVOLOG MIX 70/30) injection 35 Units  35 Units Subcutaneous Q supper Princess Bruins, DO   35 Units at 05/12/21 1716   insulin aspart protamine- aspart (NOVOLOG MIX 70/30) injection 52 Units  52 Units  Subcutaneous Q breakfast Princess Bruins, DO   52 Units at 05/13/21 1012   lisinopril (ZESTRIL) tablet 20 mg  20 mg Oral QHS Mason Jim, Amy E, MD   20 mg at 05/12/21 2055   lithium carbonate (ESKALITH) CR tablet 450 mg  450 mg Oral Q12H Lenard Lance, FNP   450 mg at 05/13/21 1003   OLANZapine zydis (ZYPREXA) disintegrating tablet 10 mg  10 mg Oral Q8H PRN Carlyn Reichert, MD       And   LORazepam (ATIVAN) tablet 1 mg  1 mg Oral PRN Carlyn Reichert, MD       And   ziprasidone (GEODON) injection 20 mg  20 mg Intramuscular PRN Carlyn Reichert, MD       magnesium hydroxide (MILK OF MAGNESIA) suspension 30 mL  30 mL Oral Daily PRN Lenard Lance, FNP       senna (SENOKOT) tablet 17.2 mg  2 tablet Oral Daily Princess Bruins, DO   17.2 mg at 05/13/21 1003   PTA Medications: Medications Prior to Admission  Medication Sig Dispense Refill Last Dose   amLODipine (NORVASC) 10 MG tablet Take 1 tablet (10 mg total) by mouth  daily. (Patient not taking: Reported on 04/16/2021) 90 tablet 1    aspirin 81 MG EC tablet Take 1 tablet (81 mg total) by mouth daily. Swallow whole. (Patient not taking: Reported on 04/16/2021) 30 tablet 12    atenolol (TENORMIN) 25 MG tablet Take 0.5 tablets (12.5 mg total) by mouth daily. (Patient not taking: Reported on 04/16/2021) 15 tablet 0    benztropine (COGENTIN) 1 MG tablet Take 1 tablet (1 mg total) by mouth 2 (two) times daily. 60 tablet 2    cloNIDine (CATAPRES) 0.2 MG tablet Take 0.2 mg by mouth 2 (two) times daily. (Patient not taking: Reported on 01/26/2021)      cloZAPine (CLOZARIL) 50 MG tablet Take 1 tablet (50 mg total) by mouth 2 (two) times daily. 60 tablet 0    glipiZIDE (GLUCOTROL XL) 10 MG 24 hr tablet Take 2 tablets (20 mg total) by mouth daily with breakfast. 60 tablet 0    haloperidol (HALDOL) 20 MG tablet Take 1 tablet (20 mg total) by mouth at bedtime. (Patient not taking: Reported on 01/26/2021) 90 tablet 1    insulin aspart (NOVOLOG) 100 UNIT/ML injection Inject  0-9 Units into the skin 3 (three) times daily with meals. 10 mL 11    insulin aspart protamine - aspart (NOVOLOG MIX 70/30 FLEXPEN) (70-30) 100 UNIT/ML FlexPen Inject 48 Units into the skin daily with breakfast. 15 mL 0    insulin aspart protamine- aspart (NOVOLOG MIX 70/30) (70-30) 100 UNIT/ML injection Inject 0.42 mLs (42 Units total) into the skin daily with supper. 10 mL 11    lisinopril (ZESTRIL) 40 MG tablet Take 1 tablet (40 mg total) by mouth daily. (Patient not taking: Reported on 01/11/2020) 90 tablet 1    lithium carbonate (ESKALITH) 450 MG CR tablet Take 1 tablet (450 mg total) by mouth every 12 (twelve) hours. (Patient not taking: Reported on 01/26/2021) 60 tablet 2    traZODone (DESYREL) 50 MG tablet Take 1 tablet (50 mg total) by mouth at bedtime as needed for sleep.       Patient Stressors: Marital or family conflict   Medication change or noncompliance    Patient Strengths: Automotive engineer for treatment/growth   Treatment Modalities: Medication Management, Group therapy, Case management,  1 to 1 session with clinician, Psychoeducation, Recreational therapy.   Physician Treatment Plan for Primary Diagnosis: Schizophrenia (HCC) Long Term Goal(s):     Short Term Goals:    Medication Management: Evaluate patient's response, side effects, and tolerance of medication regimen.  Therapeutic Interventions: 1 to 1 sessions, Unit Group sessions and Medication administration.  Evaluation of Outcomes: Progressing  Physician Treatment Plan for Secondary Diagnosis: Principal Problem:   Schizophrenia (HCC) Active Problems:   Hypertension   Type 2 diabetes mellitus (HCC)  Long Term Goal(s):     Short Term Goals:       Medication Management: Evaluate patient's response, side effects, and tolerance of medication regimen.  Therapeutic Interventions: 1 to 1 sessions, Unit Group sessions and Medication administration.  Evaluation of Outcomes:  Progressing   RN Treatment Plan for Primary Diagnosis: Schizophrenia (HCC) Long Term Goal(s): Knowledge of disease and therapeutic regimen to maintain health will improve  Short Term Goals: Ability to remain free from injury will improve, Ability to participate in decision making will improve, Ability to verbalize feelings will improve, Ability to disclose and discuss suicidal ideas, and Ability to identify and develop effective coping behaviors will improve  Medication Management: RN will administer medications  as ordered by provider, will assess and evaluate patient's response and provide education to patient for prescribed medication. RN will report any adverse and/or side effects to prescribing provider.  Therapeutic Interventions: 1 on 1 counseling sessions, Psychoeducation, Medication administration, Evaluate responses to treatment, Monitor vital signs and CBGs as ordered, Perform/monitor CIWA, COWS, AIMS and Fall Risk screenings as ordered, Perform wound care treatments as ordered.  Evaluation of Outcomes: Progressing   LCSW Treatment Plan for Primary Diagnosis: Schizophrenia (HCC) Long Term Goal(s): Safe transition to appropriate next level of care at discharge, Engage patient in therapeutic group addressing interpersonal concerns.  Short Term Goals: Engage patient in aftercare planning with referrals and resources, Increase social support, Increase emotional regulation, Facilitate acceptance of mental health diagnosis and concerns, Identify triggers associated with mental health/substance abuse issues, and Increase skills for wellness and recovery  Therapeutic Interventions: Assess for all discharge needs, 1 to 1 time with Social worker, Explore available resources and support systems, Assess for adequacy in community support network, Educate family and significant other(s) on suicide prevention, Complete Psychosocial Assessment, Interpersonal group therapy.  Evaluation of Outcomes:  Progressing   Progress in Treatment: Attending groups: Yes. Participating in groups: Yes. Taking medication as prescribed: Yes. Toleration medication: Yes. Family/Significant other contact made: Yes, individual(s) contacted:  attempted to contact Karsten Ro. Number is not active Patient understands diagnosis: No. Discussing patient identified problems/goals with staff: Yes. Medical problems stabilized or resolved: Yes. Denies suicidal/homicidal ideation: Yes. Issues/concerns per patient self-inventory: No. Other: None   New problem(s) identified: No, Describe:  None   New Short Term/Long Term Goal(s):medication stabilization, elimination of SI thoughts, development of comprehensive mental wellness plan.    Patient Goals:  "to sit down and mind by business and think about my triggers and songs."   Discharge Plan or Barriers: Pt will f/u with [facility] for services. APS has been contacted to see if pt is in need of guardian.    Reason for Continuation of Hospitalization: Medication stabilization   Estimated Length of Stay: 3-5 days   Scribe for Treatment Team: Catha Brow 05/13/2021 3:47 PM

## 2021-05-13 NOTE — Group Note (Signed)
Recreation Therapy Group Note   Group Topic:Leisure Education  Group Date: 05/13/2021 Start Time: 1010 End Time: 1120 Facilitators: Caroll Rancher, LRT,CTRS Location: [address] Day[address]   Goal Area(s) Addresses:  Patient will identify positive leisure activities for use post discharge. Patient will identify at least one positive benefit of participation in leisure activities.  Patient will work effectively create their own community recreation facility.  Group Description:  Patients were asked to create a community recreation facility.  Patients were to identify activities offered, population it's geared towards, mission of facility and benefits. Patients created a brochure of their facility and presented it to the group.   Affect/Mood: N/A   Participation Level: Did not attend    Clinical Observations/Individualized Feedback:     Plan: Continue to engage patient in RT group sessions 2-3x/week.   Caroll Rancher, LRT,CTRS 05/13/2021 1:46 PM

## 2021-05-14 LAB — GLUCOSE, CAPILLARY
Glucose-Capillary: 100 mg/dL — ABNORMAL HIGH (ref 70–99)
Glucose-Capillary: 200 mg/dL — ABNORMAL HIGH (ref 70–99)
Glucose-Capillary: 225 mg/dL — ABNORMAL HIGH (ref 70–99)
Glucose-Capillary: 297 mg/dL — ABNORMAL HIGH (ref 70–99)

## 2021-05-14 NOTE — Plan of Care (Signed)
  Problem: Activity: Goal: Interest or engagement in activities will improve Outcome: Progressing   Problem: Coping: Goal: Ability to verbalize frustrations and anger appropriately will improve Outcome: Progressing   Problem: Coping: Goal: Ability to demonstrate self-control will improve Outcome: Progressing   Problem: Safety: Goal: Periods of time without injury will increase Outcome: Progressing   Problem: Coping: Goal: Coping ability will improve Outcome: Progressing   

## 2021-05-14 NOTE — Progress Notes (Signed)
[facility] MD Progress Note   05/14/2021 6:38 PM Mitchell [name]  MRN:  [id number]  Chief Complaint: delusions and SI  Reason for Admission: Mitchell [name] is a 61 y.o. male with PPHx of Schizophrenia and multiple hospitalizations, who presented to [facility] via IVC for delusion that he is Mitchell [name] and Mitchell [name] with threats to jump into a trash compactor. He was then admitted Involuntary to [facility] for treatment of schizophrenia exacerbated by medication selective adherence.  Chart Review from last 24 hours:  The patient's chart was reviewed and nursing notes were reviewed. The patient's case was discussed in multidisciplinary team meeting. Per nursing, he had no acute behavioral issues or safety concerns noted.  He did attend evening wrap-up but no other groups yesterday.  Per [facility] he was compliant with scheduled medications and did not require PRNs.  Information Obtained Today During Patient Interview: The patient was seen and evaluated on the unit.  He is resting in his room and states that he has been sleeping and eating well.  He still believes that he is "[name]" and when asked who Mitchell [name] is, states "that is a guy who is going to prison for murder and he wants me to take his name."  He denies SI, HI, ideas of reference, first rank symptoms or paranoia on the unit.  When questioned as to the reason for his hospitalization he continues to have no insight.  He is aware that he is in a psychiatric facility but with attempts to talk to him about assisted living or group home placement after discharge, he is adamant that he wants to return to his own apartment.  When it was explained that there is concern that he is not able to manage his own medications or administer his insulin he shows no insight.  He cannot discuss the risks or benefits to potentially stopping or missing any of his medications and believes that he has the capability to handle his own affairs and medications  after discharge.  When questioned about a psychiatric diagnosis he states he thinks he has been told he has schizophrenia but is not sure that this is accurate.  He is oriented to month year and city today.  He voices no physical complaints and specifically denies issues with chest pain, dizziness, shortness of breath, urinary issues, constipation or drooling.  He denies medication side effects.  Principal Problem: Schizophrenia ([place]) Diagnosis: Principal Problem:   Schizophrenia ([place]) Active Problems:   Hypertension   Type 2 diabetes mellitus ([place])  Total Time Spent in Direct Patient Care: I personally spent 25 minutes on the unit in direct patient care. The direct patient care time included face-to-face time with the patient, reviewing the patient's chart, communicating with other professionals, and coordinating care. Greater than 50% of this time was spent in counseling or coordinating care with the patient regarding goals of hospitalization, psycho-education, and discharge planning needs.  Past Psychiatric History: see H&P  Past Medical History:  Past Medical History:  Diagnosis Date   Bipolar affective disorder ([place])    takes Zyprexa daily   Coarse tremors 10/02/2014   Diabetes mellitus    takes Victoza,Metformin,and Glipizide daily   Hypertension    takes Amlodipine,Lisinopril and Clonidine daily   Hyponatremia    history of   Lithium toxicity 10/02/2014   Mental disorder    takes Lithium daily   Schizoaffective disorder    takes Trazodone nightly   Schizoaffective disorder ([place]) 01/04/2019  Seasonal allergies    takes Claritin daily   Sleep apnea    sleep study >75yr ago   Stroke ([facility]    left arm weakness    Past Surgical History:  Procedure Laterality Date   CATARACT EXTRACTION W/PHACO Right 02/14/2013   Procedure: CATARACT EXTRACTION PHACO AND INTRAOCULAR LENS PLACEMENT (IOC);  Surgeon: GAdonis Brook MD;  [place]  Service: Ophthalmology;  Laterality: Right;    CATARACT EXTRACTION W/PHACO Left 06/13/2013   Procedure: CATARACT EXTRACTION PHACO AND INTRAOCULAR LENS PLACEMENT (IOC);  Surgeon: GAdonis Brook MD;  [place]  Service: Ophthalmology;  Laterality: Left;   CIRCUMCISION  20 yrs. ago   EYE SURGERY     Family History:see H&P  Family Psychiatric History: unable to obtain  Social History:  Social History   Substance and Sexual Activity  Alcohol Use Not Currently     Social History   Substance and Sexual Activity  Drug Use No    Social History   Socioeconomic History   Marital status: Divorced    Spouse name: Not on file   Number of children: Not on file   Years of education: Not on file   Highest education level: Not on file  Occupational History   Not on file  Tobacco Use   Smoking status: Never   Smokeless tobacco: Never  Vaping Use   Vaping Use: Never used  Substance and Sexual Activity   Alcohol use: Not Currently   Drug use: No   Sexual activity: Yes    Birth control/protection: None  Other Topics Concern   Not on file  Social History Narrative   Not on file   Social Determinants of Health   Financial Resource Strain: Not on file  Food Insecurity: Not on file  Transportation Needs: Not on file  Physical Activity: Not on file  Stress: Not on file  Social Connections: Not on file   Sleep: Good  Appetite: Good  Current Medications: Current Facility-Administered Medications  Medication Dose Route Frequency Provider Last Rate Last Admin   acetaminophen (TYLENOL) tablet 650 mg  650 mg Oral Q6H PRN ALucky Rathke FNP       alum & mag hydroxide-simeth (MAALOX/MYLANTA) 200-200-20 MG/5ML suspension 30 mL  30 mL Oral Q4H PRN ALucky Rathke FNP       amLODipine (NORVASC) tablet 10 mg  10 mg Oral QHS NMerrily Brittle DO   10 mg at 05/13/21 2103   aspirin EC tablet 81 mg  81 mg Oral Daily ALucky Rathke FNP   81 mg at 05/14/21 0816   atenolol (TENORMIN) tablet 12.5 mg  12.5 mg Oral Daily ALucky Rathke FNP    12.5 mg at 05/14/21 0816   chewing gum (ORBIT) sugar free  1 Stick Oral TID PRN NMerrily Brittle DO       cloNIDine (CATAPRES) tablet 0.2 mg  0.2 mg Oral BID ALucky Rathke FNP   0.2 mg at 05/14/21 1740   cloZAPine (CLOZARIL) tablet 75 mg  75 mg Oral QHS NMerrily Brittle DO   75 mg at 05/13/21 2103   donepezil (ARICEPT) tablet 5 mg  5 mg Oral QHS NMerrily Brittle DO   5 mg at 05/13/21 2102   haloperidol (HALDOL) tablet 15 mg  15 mg Oral QHS SViann FishE, MD   15 mg at 05/13/21 2102   haloperidol (HALDOL) tablet 5 mg  5 mg Oral Daily SHarlow Asa MD   5 mg at 05/14/21 [id number]  insulin aspart (novoLOG) injection 0-5 Units  0-5 Units Subcutaneous QHS Lorrena Goranson E, MD       insulin aspart (novoLOG) injection 0-9 Units  0-9 Units Subcutaneous TID WC Harlow Asa, MD   3 Units at 05/14/21 1736   insulin aspart protamine- aspart (NOVOLOG MIX 70/30) injection 35 Units  35 Units Subcutaneous Q supper Merrily Brittle, DO   35 Units at 05/14/21 1738   insulin aspart protamine- aspart (NOVOLOG MIX 70/30) injection 52 Units  52 Units Subcutaneous Q breakfast Merrily Brittle, DO   52 Units at 05/14/21 8546   lisinopril (ZESTRIL) tablet 20 mg  20 mg Oral QHS Nelda Marseille, Havanah Nelms E, MD   20 mg at 05/13/21 2102   lithium carbonate (ESKALITH) CR tablet 450 mg  450 mg Oral Q12H Lucky Rathke, FNP   450 mg at 05/14/21 0816   OLANZapine zydis (ZYPREXA) disintegrating tablet 10 mg  10 mg Oral Q8H PRN Corky Sox, MD       And   LORazepam (ATIVAN) tablet 1 mg  1 mg Oral PRN Corky Sox, MD       And   ziprasidone (GEODON) injection 20 mg  20 mg Intramuscular PRN Corky Sox, MD       magnesium hydroxide (MILK OF MAGNESIA) suspension 30 mL  30 mL Oral Daily PRN Lucky Rathke, FNP       senna (SENOKOT) tablet 17.2 mg  2 tablet Oral Daily Merrily Brittle, DO   17.2 mg at 05/14/21 2703    Lab Results:  Results for orders placed or performed during the hospital encounter of 04/23/21 (from the past 48 hour(s))   Glucose, capillary     Status: Abnormal   Collection Time: 05/12/21  7:44 PM  Result Value Ref Range   Glucose-Capillary 61 (L) 70 - 99 mg/dL    Comment: Glucose reference range applies only to samples taken after fasting for at least 8 hours.  Glucose, capillary     Status: Abnormal   Collection Time: 05/12/21  8:13 PM  Result Value Ref Range   Glucose-Capillary 103 (H) 70 - 99 mg/dL    Comment: Glucose reference range applies only to samples taken after fasting for at least 8 hours.  Glucose, capillary     Status: Abnormal   Collection Time: 05/13/21  5:49 AM  Result Value Ref Range   Glucose-Capillary 101 (H) 70 - 99 mg/dL    Comment: Glucose reference range applies only to samples taken after fasting for at least 8 hours.  Glucose, capillary     Status: Abnormal   Collection Time: 05/13/21 12:15 PM  Result Value Ref Range   Glucose-Capillary 182 (H) 70 - 99 mg/dL    Comment: Glucose reference range applies only to samples taken after fasting for at least 8 hours.  Glucose, capillary     Status: Abnormal   Collection Time: 05/13/21  5:11 PM  Result Value Ref Range   Glucose-Capillary 112 (H) 70 - 99 mg/dL    Comment: Glucose reference range applies only to samples taken after fasting for at least 8 hours.  Glucose, capillary     Status: Abnormal   Collection Time: 05/13/21  7:53 PM  Result Value Ref Range   Glucose-Capillary 112 (H) 70 - 99 mg/dL    Comment: Glucose reference range applies only to samples taken after fasting for at least 8 hours.  Glucose, capillary     Status: Abnormal   Collection Time: 05/14/21  5:30 AM  Result Value Ref Range   Glucose-Capillary 100 (H) 70 - 99 mg/dL    Comment: Glucose reference range applies only to samples taken after fasting for at least 8 hours.  Glucose, capillary     Status: Abnormal   Collection Time: 05/14/21 11:49 AM  Result Value Ref Range   Glucose-Capillary 200 (H) 70 - 99 mg/dL    Comment: Glucose reference range applies  only to samples taken after fasting for at least 8 hours.  Glucose, capillary     Status: Abnormal   Collection Time: 05/14/21  4:48 PM  Result Value Ref Range   Glucose-Capillary 225 (H) 70 - 99 mg/dL    Comment: Glucose reference range applies only to samples taken after fasting for at least 8 hours.    Blood Alcohol level:  Lab Results  Component Value Date   ETH <10 04/16/2021   ETH <10 08/65/7846    Metabolic Disorder Labs: Lab Results  Component Value Date   HGBA1C 9.2 (H) 04/24/2021   MPG 217.34 04/24/2021   MPG 271.87 01/26/2021   No results found for: PROLACTIN Lab Results  Component Value Date   CHOL 140 01/30/2021   TRIG 95 01/30/2021   HDL 49 01/30/2021   CHOLHDL 2.9 01/30/2021   VLDL 19 01/30/2021   LDLCALC 72 01/30/2021   LDLCALC 67 05/20/2020   Psychiatric Specialty Exam: Physical Exam Vitals and nursing note reviewed.  Constitutional:      Appearance: He is not diaphoretic.  HENT:     Head: Normocephalic.  Pulmonary:     Effort: Pulmonary effort is normal. No respiratory distress.  Neurological:     General: No focal deficit present.     Mental Status: He is alert.  Psychiatric:        Behavior: Behavior is cooperative.     Review of Systems  Respiratory:  Negative for shortness of breath.   Cardiovascular:  Negative for chest pain.  Gastrointestinal:  Negative for nausea and vomiting.  Neurological:  Negative for dizziness and headaches.    Body mass index is 37.42 kg/m. Temp:  [98.7 F (37.1 C)] 98.7 F (37.1 C) (02/16 0606) Pulse Rate:  [73-85] 73 (02/16 1625) BP: (97-138)/(61-78) 138/78 (02/16 1625) SpO2:  [97 %-100 %] 100 % (02/16 0608)  General Appearance:  Casually dressed, obese, fair grooming    Eye Contact:   Minimal    Speech:   Rambling and mumbling quality, normal fluency    Volume:   Normal    Mood:   Described as "good" - appears aloof and irritable when discussing discharge plans    Affect:   Constricted and  at times irritable    Thought Process:  Concrete but linear  Duration of Psychotic Symptoms:  Greater than six months  Past Diagnosis of Schizophrenia or Psychoactive disorder:  Yes   Orientation:   Oriented to month, year, city, but not self or situation   Thought Content:   Has residual fixed delusion that he is "[name]" but denies AVH, paranoia, ideas of reference or first rank symptoms.  He is not grossly responding to internal or external stimuli on exam.  He denies SI or HI.  He gives illogical reasoning as to why he is admitted to the hospital and cannot articulate his own diagnosis or reason that he would need prompts or helps with medications  Hallucinations: Denied  Ideas of Reference: None   Suicidal Thoughts:   No   Homicidal Thoughts:   No  Memory:   Immediate - fair; remote poor    Judgement:   Poor    Insight:   Poor    Psychomotor Activity:   Normal - no evidence of akathisias or tremors, no stiffness, cogwheeling; AIMS 0     Concentration:   Poor    Attention Span:   Poor   Recall:   Poor    Fund of Knowledge:   Limited    Language:   Fair    Assets:   Leisure Time    Sleep:    8 hours  AIMS: Facial and Oral Movements Muscles of Facial Expression: None, normal Lips and Perioral Area: None, normal Jaw: None, normal Tongue: None, normal,Extremity Movements Upper (arms, wrists, hands, fingers): None, normal Lower (legs, knees, ankles, toes): None, normal, Trunk Movements Neck, shoulders, hips: None, normal, Overall Severity Severity of abnormal movements (highest score from questions above): None, normal Incapacitation due to abnormal movements: None, normal Patient's awareness of abnormal movements (rate only patient's report): No Awareness, Dental Status Current problems with teeth and/or dentures?: No Does patient usually wear dentures?: No   AIMS 0 (2/13 and on 2/16) MOCA: 13/30  MMSE: 14/30 Pillbox test: Failed     ASSESSMENT: Diagnoses / Active Problems: Schizophrenia by hx Type II DM HTN Hyponatremia Elevated Creatinine High risk medication use Elevated transaminases Anemia Cognitive impairment (r/o major neurocognitive d/o)  PLAN: Safety and Monitoring:  -- Voluntary admission to inpatient psychiatric unit for safety, stabilization and treatment  -- Daily contact with patient to assess and evaluate symptoms and progress in treatment  -- Patient's case to be discussed in multi-disciplinary team meeting  -- Observation Level : q15 minute checks  -- Vital signs:  q12 hours  -- Precautions: suicide, elopement, and assault  2. Psychiatric Diagnoses and Treatment:    Schizophrenia by hx -Continued Haldol 5 mg qAM & 15 mg qPM for psychosis -Continued Clozaril 75 mg nightly for psychosis - WBC 6.5, ANC 3.5, Troponin 3, CRP 2 (2/10) - next labs due 2/17 - Clozaril level 187, NorClozapine 87, and total Cloz + Nor-cloz level 273 (2/5) - QTC 492m on (2/7) - will repeat 05/15/20 - A1c 9.2 (1/27) and lipids WNL in 11/22 - Continue senokot 2 tabs daily for Clozaril-induced constipation  -Continue Lithium 450 mg BID for mood stability - Li level 0.67 (2/3) - Cr (!) 1.28 (2/10)- trending while on Lithium and po hydration encouraged - repeating on 2/17 - TSH 2.233 (1/31) - ACTT referral made  Cognitive impairment (r/o major neurocognitive d/o) MOCA: 13/30  MMSE: 14/30 Pillbox test: Failed (per OT patient is not able to manage his own medication. Patient will need 24/7 caregiver support to assist with IADLs and medication management to be successful)  - Ongoing concern for ability to live independently and to complete IADLs and manage his insulin regimen and medications- currently patient refuses to live in assisted living or group home but shows no insight or understanding into his complex medication regimen, the risks associated with not being medication compliant, or his medical or psychiatric  diagnoses.. APS report accepted and guardianship recommended by medical providers - Follow-up on patient guardianship recommendations - Continued Aricept 5 mg qHS for poor memory    3. Medical Issues Being Addressed:   T2DM with insulin dependence - A1C of 9.2. Diabetic coordinator on-board for insulin management -Continued insulin per diabetic coordinator with 70/30 52u with breakfast and continue 70/30 35u with supper  - Change SSI to sensitive coverage per recommendations  of diabetic coordinator - Continued HS coverage -Placed on unit restriction for meals to monitor portions and carb choices   HTN -Lisinopril 66m daily (reduced due to concern for creatinine) -Amlodipine 10 mg daily -Clonidine 0.2 mg BID -Atenolol 12.572mdaily  Elevated transaminases, down trending - AST 48 and ALT 75 (2/10); Alk phos 136 (2/10) - Med consulted, stated that no further work up necessary - Hepatitis panel nonreactive, ammonia 35, negative RUQ u/s 02/10/21 - Will need outpatient f/u after discharge  Mild Hyponatremia - Na+ 133 - rechecking CMP 05/15/21  Elevated Creatinine - 1.28 (2/10) - Rechecking CMP 05/15/21 and encouraging po hydration  Anemia (H/H 11.8/37) - Checking Ferritin, TIBC/iron, folate, and B12 on 2/17  4. Discharge Planning:   -- Social work and case management to assist with discharge planning and identification of hospital follow-up needs prior to discharge  -- Discharge Concerns: Need to establish safe housing option given his need for assistance with ADLs and medications - need for guardianship and potential ALF/group home referral; ACTT referral  -- Discharge Goals: Return home with outpatient referrals for mental health follow-up including medication management/psychotherapy   Skeeter Sheard E Artis DelayMD, FAAlda Ponder

## 2021-05-14 NOTE — Progress Notes (Signed)
°   05/14/21 1953  Psych Admission Type (Psych Patients Only)  Admission Status Voluntary  Psychosocial Assessment  Patient Complaints None  Eye Contact Fair  Facial Expression Flat  Affect Appropriate to circumstance  Speech Logical/coherent  Interaction Assertive  Motor Activity Slow  Appearance/Hygiene Unremarkable  Behavior Characteristics Cooperative  Mood Pleasant  Thought Process  Coherency WDL  Content WDL  Delusions None reported or observed  Perception WDL  Hallucination None reported or observed  Judgment Limited  Confusion None  Danger to Self  Current suicidal ideation? Denies  Danger to Others  Danger to Others None reported or observed   Pt seen in dayroom in milieu. Pt denies SI, HI, AVH and pain. Pt denies anxiety and depression. Pt is trying to make better choices for snacks. Still on unit restriction for meals. Pt denies any other complaints.

## 2021-05-14 NOTE — Progress Notes (Signed)
°   05/14/21 0830  Psych Admission Type (Psych Patients Only)  Admission Status Voluntary  Psychosocial Assessment  Patient Complaints None  Eye Contact Fair  Facial Expression Flat  Affect Appropriate to circumstance  Speech Logical/coherent  Interaction Assertive  Motor Activity Slow  Appearance/Hygiene Unremarkable  Behavior Characteristics Pacing;Appropriate to situation  Mood Preoccupied;Euthymic  Thought Community education officer  Content Preoccupation  Delusions [place]  Perception WDL  Hallucination None reported or observed  Judgment Limited  Confusion None  Danger to Self  Current suicidal ideation? Denies  Danger to Others  Danger to Others None reported or observed

## 2021-05-14 NOTE — Group Note (Signed)
Recreation Therapy Group Note   Group Topic:Team Building  Group Date: 05/14/2021 Start Time: 1000 End Time: 1035 Facilitators: Caroll Rancher, LRT,CTRS Location: [address] Dayroom   Goal Area(s) Addresses:  Patient will effectively work with peer towards shared goal.  Patient will identify skills used to make activity successful.  Patient will identify how skills used during activity can be used to reach post d/c goals.   Group Description:  Straw Bridge. In teams of 3-5, patients were given 15 plastic drinking straws and an equal length of masking tape. Using the materials provided, patients were instructed to build a free standing bridge-like structure to suspend an everyday item (ex: puzzle box) off of the floor or table surface. All materials were required to be used by the team in their design. LRT facilitated post-activity discussion reviewing team process. Patients were encouraged to reflect how the skills used in this activity can be generalized to daily life post discharge.   Affect/Mood: N/A   Participation Level: Did not attend    Clinical Observations/Individualized Feedback:      Plan: Continue to engage patient in RT group sessions 2-3x/week.   Caroll Rancher, LRT,CTRS 05/14/2021 11:30 AM

## 2021-05-14 NOTE — Progress Notes (Signed)
The patient rated his day as a 10 out of 10 since he has "the good lord" on his side. His goal for today was to pray a little.

## 2021-05-14 NOTE — B[facility] Group Notes (Signed)
Adult Psychoeducational Group Note  Date:  05/14/2021 Time:  9:03 AM  Group Topic/Focus:  Goals Group:   The focus of this group is to help patients establish daily goals to achieve during treatment and discuss how the patient can incorporate goal setting into their daily lives to aide in recovery.  Participation Level:  Did Not Attend   Dub Mikes 05/14/2021, 9:03 AM

## 2021-05-14 NOTE — Progress Notes (Signed)
D:  Mitchell [name] was pleasant and cooperative this evening.  He denied SI/HI or AVH.  His blood sugar this evening was 112 which did not require insulin coverage.  Talked with him about his diabetes and the importance in maintaining carb modified diet.  Praised him for his compliance and he did smile and say "thank you."  He voiced no complaints this evening.  He appears to be in no physical distress.  No prn's requested at this time. A:  1:1 with RN for support and encouragement.  Medications given as ordered.  Q 15 minute checks maintained for safety.  Encouraged participation in group and unit activities.   R:  He is currently resting with his eyes closed and appears to be asleep.  He remains safe on the unit.  We will continue to monitor the progress towards his goals.

## 2021-05-14 NOTE — Progress Notes (Signed)
The patient rated his day as a 10 out of 10 because he did everything that he was asked to do. He did not go into further detail. Patient states that he had a good conversation with his kids over the telephone. His goal for today was to focus on Jesus and himself.

## 2021-05-15 LAB — CBC WITH DIFFERENTIAL/PLATELET
Abs Immature Granulocytes: 0.06 10*3/uL (ref 0.00–0.07)
Basophils Absolute: 0 10*3/uL (ref 0.0–0.1)
Basophils Relative: 1 %
Eosinophils Absolute: 0.2 10*3/uL (ref 0.0–0.5)
Eosinophils Relative: 4 %
HCT: 36.3 % — ABNORMAL LOW (ref 39.0–52.0)
Hemoglobin: 12.1 g/dL — ABNORMAL LOW (ref 13.0–17.0)
Immature Granulocytes: 1 %
Lymphocytes Relative: 34 %
Lymphs Abs: 2.1 10*3/uL (ref 0.7–4.0)
MCH: 31.2 pg (ref 26.0–34.0)
MCHC: 33.3 g/dL (ref 30.0–36.0)
MCV: 93.6 fL (ref 80.0–100.0)
Monocytes Absolute: 0.5 10*3/uL (ref 0.1–1.0)
Monocytes Relative: 7 %
Neutro Abs: 3.3 10*3/uL (ref 1.7–7.7)
Neutrophils Relative %: 53 %
Platelets: 246 10*3/uL (ref 150–400)
RBC: 3.88 MIL/uL — ABNORMAL LOW (ref 4.22–5.81)
RDW: 13 % (ref 11.5–15.5)
WBC: 6.2 10*3/uL (ref 4.0–10.5)
nRBC: 0 % (ref 0.0–0.2)

## 2021-05-15 LAB — C-REACTIVE PROTEIN: CRP: 2.1 mg/dL — ABNORMAL HIGH (ref <1.0)

## 2021-05-15 LAB — FERRITIN: Ferritin: 80 ng/mL (ref 24–336)

## 2021-05-15 LAB — FOLATE: Folate: 10.5 ng/mL (ref >5.9)

## 2021-05-15 LAB — GLUCOSE, CAPILLARY
Glucose-Capillary: 147 mg/dL — ABNORMAL HIGH (ref 70–99)
Glucose-Capillary: 146 mg/dL — ABNORMAL HIGH (ref 70–99)
Glucose-Capillary: 262 mg/dL — ABNORMAL HIGH (ref 70–99)
Glucose-Capillary: 307 mg/dL — ABNORMAL HIGH (ref 70–99)

## 2021-05-15 LAB — COMPREHENSIVE METABOLIC PANEL
ALT: 94 U/L — ABNORMAL HIGH (ref 0–44)
AST: 76 U/L — ABNORMAL HIGH (ref 15–41)
Albumin: 3.7 g/dL (ref 3.5–5.0)
Alkaline Phosphatase: 161 U/L — ABNORMAL HIGH (ref 38–126)
Anion gap: 6 (ref 5–15)
BUN: 17 mg/dL (ref 6–20)
CO2: 27 mmol/L (ref 22–32)
Calcium: 9 mg/dL (ref 8.9–10.3)
Chloride: 99 mmol/L (ref 98–111)
Creatinine, Ser: 1.27 mg/dL — ABNORMAL HIGH (ref 0.61–1.24)
GFR, Estimated: >60 mL/min (ref >60)
Glucose, Bld: 290 mg/dL — ABNORMAL HIGH (ref 70–99)
Potassium: 4.5 mmol/L (ref 3.5–5.1)
Sodium: 132 mmol/L — ABNORMAL LOW (ref 135–145)
Total Bilirubin: 0.3 mg/dL (ref 0.3–1.2)
Total Protein: 7.5 g/dL (ref 6.5–8.1)

## 2021-05-15 LAB — IRON AND TIBC
Iron: 77 ug/dL (ref 45–182)
Saturation Ratios: 20 % (ref 17.9–39.5)
TIBC: 390 ug/dL (ref 250–450)
UIBC: 313 ug/dL

## 2021-05-15 LAB — LITHIUM LEVEL: Lithium Lvl: 0.89 mmol/L (ref 0.60–1.20)

## 2021-05-15 LAB — TROPONIN I (HIGH SENSITIVITY): Troponin I (High Sensitivity): 2 ng/L (ref <18)

## 2021-05-15 LAB — VITAMIN B12: Vitamin B-12: 366 pg/mL (ref 180–914)

## 2021-05-15 NOTE — Progress Notes (Signed)
°   05/15/21 1920  Psych Admission Type (Psych Patients Only)  Admission Status Voluntary  Psychosocial Assessment  Patient Complaints None  Eye Contact Brief  Facial Expression Flat  Affect Appropriate to circumstance  Speech Logical/coherent  Interaction Assertive  Motor Activity Slow  Appearance/Hygiene Unremarkable  Behavior Characteristics Cooperative  Mood Pleasant  Thought Process  Coherency WDL  Content WDL  Delusions None reported or observed  Perception WDL  Hallucination None reported or observed  Judgment Limited  Confusion None  Danger to Self  Current suicidal ideation? Denies  Danger to Others  Danger to Others None reported or observed   Pt seen in his room. Pt denies SI, HI, AVH and pain. Pt denies anxiety and depression. Pt CBG=307 this evening. Pt mistakenly let off the unit for meals. Pt chose foods that were not healthy for his diabetes. Pt states he had the barbecue. "I probably should have chose the fish but the barbecue was good." Pt counseled on types of foods that raise blood sugar. Pt also commended for thinking of the other meal choice as better for him even though he did not choose it. Pt still needs encouragement to make better food and snack choices. Pt verbalized understanding of the information discussed. No other complaints noted.

## 2021-05-15 NOTE — Group Note (Signed)
LCSW Group Therapy Note ° ° °Group Date: 05/15/2021 °Start Time: 1100 °End Time: 1200 ° ° °Participation Level: Did not attend °  °Description of Group:  °In this group, patients will learn to explore, identify, and clarify the values that influence their decisions and behavior and encourages them to build on their inner resources and strengths. This group will be clarifying, exporational and eductional, with patients participating in identifying their own values, identifying others values, and identifying societal values and their impact on their lives.  °  °  °Therapeutic Goals: °Patient will learn to identify values. °Patient will learn to identify others values. °Patient will learn ways to use their values in decisions they make in their daily lives. °Patient will receive support and feedback from others °  °  °Therapeutic Modalities:  °Acceptance and Commitment Therapy  °  °  °Summary of Progress/Problems: Did not attend ° ° °Sharlotte Baka MSW, LCSW °Clincal Social Worker  ° [facility]  °  °

## 2021-05-15 NOTE — Progress Notes (Signed)
Adult Psychoeducational Group Note  Date:  05/15/2021 Time:  10:11 PM  Group Topic/Focus:  Wrap-Up Group:   The focus of this group is to help patients review their daily goal of treatment and discuss progress on daily workbooks.  Participation Level:  Active  Participation Quality:  Appropriate  Affect:  Appropriate  Cognitive:  Appropriate  Insight: Appropriate  Engagement in Group:  Engaged  Modes of Intervention:  Discussion  Additional Comments:  Pt stated his goal for today was to focus on his treatment plan. Pt stated he accomplished his goal today. Pt stated he talked with his doctor and with his social worker about his care today. Pt rated his overall day a 10. Pt stated his goal was to keep his blood sugars in the 100's. Pt's blood sugar was 307 tonight. Pt stated he made no calls today. Pt stated he felt better about himself today. Pt stated staff brought back back lunch for him but he was allowed to attend dinner. Pt stated he took all medications provided today. Pt stated his appetite was pretty good today. Pt rated sleep last night was pretty good. Pt stated the goal tonight was to get some rest. Pt stated he had no physical pain tonight. Pt deny visual hallucinations and auditory issues tonight. Pt denies thoughts of harming himself or others. Pt stated he would alert staff if anything changed  [name] 05/15/2021, 10:11 PM

## 2021-05-15 NOTE — Progress Notes (Signed)
Patient denies SI, HI and AVH this shift. Patient has been compliant with medications and has had no behavioral dyscontrol.   Assess patient for safety, engage patient in 1:1 staff talks. Continue to monitor as planned.   Patient able to contract for safety. Continue to monitor as planned.

## 2021-05-15 NOTE — Progress Notes (Signed)
[facility] MD Progress Note   05/15/2021 9:32 AM Mitchell [name]  MRN:  [id number]  Chief Complaint: delusions and SI  Reason for Admission: Mitchell [name] is a 61 y.o. male with PPHx of Schizophrenia and multiple hospitalizations, who presented to [facility] via IVC for delusion that he is Mitchell [name] and Mitchell [name] with threats to jump into a trash compactor. He was then admitted Involuntary to [facility] for treatment of schizophrenia exacerbated by medication selective adherence.  Chart Review from last 24 hours:  The patient's chart was reviewed and nursing notes were reviewed. The patient's case was discussed in multidisciplinary team meeting. Per nursing, he had no acute behavioral issues or safety concerns noted.  He did attend evening wrap-up but no other groups yesterday.  He was visible in the milieu on night shift. per [facility] he was compliant with scheduled medications and did not require PRNs.  Information Obtained Today During Patient Interview: The patient was seen and evaluated on the unit.  He is resting in bed and was encouraged to be out in the milieu.  He states he has showered but was encouraged to continue attending to his ADLs.  He questions when he will be going home and time was spent discussing the concerns that he needs daily assistance with his medications and IADL skills.  He questions this and states that he does not want to go to an assisted living or [name] home.  He continues to show no insight into his mental health or medical comorbidities and when asked about the importance of his medication regimen responds "I take my medications."  When asked what would happen if he missed doses of his insulin or psychiatric medications he changes the subject.  He then derails into discussion about how he believes that he had missed some of his medications prior to admission because he had not received them appropriately from the pharmacy.  He denies SI, HI, AVH, paranoia or ideas  of reference.  He continues to have the delusional belief that he is "Mitchell [name]" and when asked who Mitchell [name] is states that this is a man who is going to prison for murder who wants the patient to assume his identity.  He reports stable sleep and appetite and voices no physical complaints.  He states he moved his bowels today and denies issues with constipation.  He denies issues with drooling, oversedation, palpitations, dizziness, urinary hesitancy, or shortness of breath.  He denies medication side effects.  Principal Problem: Schizophrenia ([place]) Diagnosis: Principal Problem:   Schizophrenia ([place]) Active Problems:   Hypertension   Type 2 diabetes mellitus ([place])  Total Time Spent in Direct Patient Care: I personally spent 25 minutes on the unit in direct patient care. The direct patient care time included face-to-face time with the patient, reviewing the patient's chart, communicating with other professionals, and coordinating care. Greater than 50% of this time was spent in counseling or coordinating care with the patient regarding goals of hospitalization, psycho-education, and discharge planning needs.  Past Psychiatric History: see H&P  Past Medical History:  Past Medical History:  Diagnosis Date   Bipolar affective disorder ([place])    takes Zyprexa daily   Coarse tremors 10/02/2014   Diabetes mellitus    takes Victoza,Metformin,and Glipizide daily   Hypertension    takes Amlodipine,Lisinopril and Clonidine daily   Hyponatremia    history of   Lithium toxicity 10/02/2014   Mental disorder    takes Lithium daily  Schizoaffective disorder    takes Trazodone nightly   Schizoaffective disorder ([place]) 01/04/2019   Seasonal allergies    takes Claritin daily   Sleep apnea    sleep study >57yr ago   Stroke ([facility]    left arm weakness    Past Surgical History:  Procedure Laterality Date   CATARACT EXTRACTION W/PHACO Right 02/14/2013   Procedure: CATARACT EXTRACTION PHACO AND  INTRAOCULAR LENS PLACEMENT ([place];  Surgeon: GAdonis Brook MD;  Location: [facility]  Service: Ophthalmology;  Laterality: Right;   CATARACT EXTRACTION W/PHACO Left 06/13/2013   Procedure: CATARACT EXTRACTION PHACO AND INTRAOCULAR LENS PLACEMENT (I[facility]);  Surgeon: GAdonis Brook MD;  [place] [facility]  Service: Ophthalmology;  Laterality: Left;   CIRCUMCISION  20 yrs. ago   EYE SURGERY     Family History:see H&P  Family Psychiatric History: unable to obtain  Social History:  Social History   Substance and Sexual Activity  Alcohol Use Not Currently     Social History   Substance and Sexual Activity  Drug Use No    Social History   Socioeconomic History   Marital status: Divorced    Spouse name: Not on file   Number of children: Not on file   Years of education: Not on file   Highest education level: Not on file  Occupational History   Not on file  Tobacco Use   Smoking status: Never   Smokeless tobacco: Never  Vaping Use   Vaping Use: Never used  Substance and Sexual Activity   Alcohol use: Not Currently   Drug use: No   Sexual activity: Yes    Birth control/protection: None  Other Topics Concern   Not on file  Social History Narrative   Not on file   Social Determinants of Health   Financial Resource Strain: Not on file  Food Insecurity: Not on file  Transportation Needs: Not on file  Physical Activity: Not on file  Stress: Not on file  Social Connections: Not on file   Sleep: Good  Appetite: Good  Current Medications: Current Facility-Administered Medications  Medication Dose Route Frequency Provider Last Rate Last Admin   acetaminophen (TYLENOL) tablet 650 mg  650 mg Oral Q6H PRN ALucky Rathke FNP       alum & mag hydroxide-simeth (MAALOX/MYLANTA) 200-200-20 MG/5ML suspension 30 mL  30 mL Oral Q4H PRN ALucky Rathke FNP       amLODipine (NORVASC) tablet 10 mg  10 mg Oral QHS NMerrily Brittle DO   10 mg at 05/14/21 2040   aspirin EC tablet 81 mg  81 mg Oral  Daily ALucky Rathke FNP   81 mg at 05/15/21 0826   atenolol (TENORMIN) tablet 12.5 mg  12.5 mg Oral Daily ALucky Rathke FNP   12.5 mg at 05/15/21 0827   chewing gum (ORBIT) sugar free  1 Stick Oral TID PRN NMerrily Brittle DO       cloNIDine (CATAPRES) tablet 0.2 mg  0.2 mg Oral BID ALucky Rathke FNP   0.2 mg at 05/15/21 0826   cloZAPine (CLOZARIL) tablet 75 mg  75 mg Oral QHS NMerrily Brittle DO   75 mg at 05/14/21 2040   donepezil (ARICEPT) tablet 5 mg  5 mg Oral QHS NMerrily Brittle DO   5 mg at 05/14/21 2041   haloperidol (HALDOL) tablet 15 mg  15 mg Oral QHS SViann FishE, MD   15 mg at 05/14/21 2040   haloperidol (HALDOL) tablet 5 mg  5 mg Oral Daily Nelda Marseille, Nazarene Bunning E, MD   5 mg at 05/15/21 0827   insulin aspart (novoLOG) injection 0-5 Units  0-5 Units Subcutaneous QHS Harlow Asa, MD   3 Units at 05/14/21 2042   insulin aspart (novoLOG) injection 0-9 Units  0-9 Units Subcutaneous TID WC Harlow Asa, MD   1 Units at 05/15/21 0634   insulin aspart protamine- aspart (NOVOLOG MIX 70/30) injection 35 Units  35 Units Subcutaneous Q supper Merrily Brittle, DO   35 Units at 05/14/21 1738   insulin aspart protamine- aspart (NOVOLOG MIX 70/30) injection 52 Units  52 Units Subcutaneous Q breakfast Merrily Brittle, DO   52 Units at 05/15/21 2094   lisinopril (ZESTRIL) tablet 20 mg  20 mg Oral QHS Nelda Marseille, Zandyr Barnhill E, MD   20 mg at 05/14/21 2040   lithium carbonate (ESKALITH) CR tablet 450 mg  450 mg Oral Q12H Lucky Rathke, FNP   450 mg at 05/15/21 0827   OLANZapine zydis (ZYPREXA) disintegrating tablet 10 mg  10 mg Oral Q8H PRN Corky Sox, MD       And   LORazepam (ATIVAN) tablet 1 mg  1 mg Oral PRN Corky Sox, MD       And   ziprasidone (GEODON) injection 20 mg  20 mg Intramuscular PRN Corky Sox, MD       magnesium hydroxide (MILK OF MAGNESIA) suspension 30 mL  30 mL Oral Daily PRN Lucky Rathke, FNP       senna (SENOKOT) tablet 17.2 mg  2 tablet Oral Daily Merrily Brittle, DO   17.2  mg at 05/15/21 7096    Lab Results:  Results for orders placed or performed during the hospital encounter of 04/23/21 (from the past 48 hour(s))  Glucose, capillary     Status: Abnormal   Collection Time: 05/13/21 12:15 PM  Result Value Ref Range   Glucose-Capillary 182 (H) 70 - 99 mg/dL    Comment: Glucose reference range applies only to samples taken after fasting for at least 8 hours.  Glucose, capillary     Status: Abnormal   Collection Time: 05/13/21  5:11 PM  Result Value Ref Range   Glucose-Capillary 112 (H) 70 - 99 mg/dL    Comment: Glucose reference range applies only to samples taken after fasting for at least 8 hours.  Glucose, capillary     Status: Abnormal   Collection Time: 05/13/21  7:53 PM  Result Value Ref Range   Glucose-Capillary 112 (H) 70 - 99 mg/dL    Comment: Glucose reference range applies only to samples taken after fasting for at least 8 hours.  Glucose, capillary     Status: Abnormal   Collection Time: 05/14/21  5:30 AM  Result Value Ref Range   Glucose-Capillary 100 (H) 70 - 99 mg/dL    Comment: Glucose reference range applies only to samples taken after fasting for at least 8 hours.  Glucose, capillary     Status: Abnormal   Collection Time: 05/14/21 11:49 AM  Result Value Ref Range   Glucose-Capillary 200 (H) 70 - 99 mg/dL    Comment: Glucose reference range applies only to samples taken after fasting for at least 8 hours.  Glucose, capillary     Status: Abnormal   Collection Time: 05/14/21  4:48 PM  Result Value Ref Range   Glucose-Capillary 225 (H) 70 - 99 mg/dL    Comment: Glucose reference range applies only to samples taken after fasting for at least  8 hours.  Glucose, capillary     Status: Abnormal   Collection Time: 05/14/21  7:29 PM  Result Value Ref Range   Glucose-Capillary 297 (H) 70 - 99 mg/dL    Comment: Glucose reference range applies only to samples taken after fasting for at least 8 hours.  Glucose, capillary     Status: Abnormal    Collection Time: 05/15/21  5:24 AM  Result Value Ref Range   Glucose-Capillary 147 (H) 70 - 99 mg/dL    Comment: Glucose reference range applies only to samples taken after fasting for at least 8 hours.    Blood Alcohol level:  Lab Results  Component Value Date   ETH <10 04/16/2021   ETH <10 50/93/2671    Metabolic Disorder Labs: Lab Results  Component Value Date   HGBA1C 9.2 (H) 04/24/2021   MPG 217.34 04/24/2021   MPG 271.87 01/26/2021   No results found for: PROLACTIN Lab Results  Component Value Date   CHOL 140 01/30/2021   TRIG 95 01/30/2021   HDL 49 01/30/2021   CHOLHDL 2.9 01/30/2021   VLDL 19 01/30/2021   LDLCALC 72 01/30/2021   LDLCALC 67 05/20/2020   Psychiatric Specialty Exam: Physical Exam Vitals and nursing note reviewed.  Constitutional:      Appearance: He is not diaphoretic.  HENT:     Head: Normocephalic.  Pulmonary:     Effort: Pulmonary effort is normal. No respiratory distress.  Neurological:     General: No focal deficit present.     Mental Status: He is alert.  Psychiatric:        Behavior: Behavior is cooperative.     Review of Systems  Respiratory:  Negative for shortness of breath.   Cardiovascular:  Negative for chest pain.  Gastrointestinal:  Negative for nausea and vomiting.  Neurological:  Negative for dizziness and headaches.    Body mass index is 37.42 kg/m. Temp:  [98.6 F (37 C)] 98.6 F (37 C) (02/17 0626) Pulse Rate:  [73-86] 86 (02/17 0628) BP: (112-138)/(66-78) 112/77 (02/17 0628) SpO2:  [99 %-100 %] 100 % (02/17 0628)  General Appearance:  Casually dressed, obese, fair grooming    Eye Contact:   Minimal    Speech:   Rambling and mumbling quality, normal fluency    Volume:   Normal    Mood:   Described as "good" - appears aloof and ambivalent    Affect:   Constricted at times defensive    Thought Process:  Concrete, ruminative about discharge planning  Duration of Psychotic Symptoms:  Greater than  six months  Past Diagnosis of Schizophrenia or Psychoactive disorder:  Yes   Orientation:   Oriented to month, year, city, but not self or situation   Thought Content:   Has residual fixed delusion that he is "Mitchell [name]" and has delusion that Alain Deschene is a murderer who wants him to assume his identity; he denies AVH, paranoia, ideas of reference or first rank symptoms.  He is not grossly responding to internal or external stimuli on exam.  He denies SI or HI.    Hallucinations: Denied  Ideas of Reference: None   Suicidal Thoughts:   No   Homicidal Thoughts:   No    Memory:   Immediate - fair; recent and remote poor    Judgement:   Poor    Insight:   Poor    Psychomotor Activity:   Normal     Concentration:   Fair  Attention Span:   Fair   Recall:   Poor    Fund of Knowledge:   Limited    Language:   Fair    Assets:   Leisure Time    Sleep:   6 hours  AIMS: Facial and Oral Movements Muscles of Facial Expression: None, normal Lips and Perioral Area: None, normal Jaw: None, normal Tongue: None, normal,Extremity Movements Upper (arms, wrists, hands, fingers): None, normal Lower (legs, knees, ankles, toes): None, normal, Trunk Movements Neck, shoulders, hips: None, normal, Overall Severity Severity of abnormal movements (highest score from questions above): None, normal Incapacitation due to abnormal movements: None, normal Patient's awareness of abnormal movements (rate only patient's report): No Awareness, Dental Status Current problems with teeth and/or dentures?: No Does patient usually wear dentures?: No   AIMS 0 (2/13 and on 2/16) MOCA: 13/30  MMSE: 14/30 Pillbox test: Failed    ASSESSMENT: Diagnoses / Active Problems: Schizophrenia by hx Type II DM HTN Hyponatremia Elevated Creatinine High risk medication use Elevated transaminases Anemia Cognitive impairment (r/o major neurocognitive d/o)  PLAN: Safety and  Monitoring:  -- Voluntary admission to inpatient psychiatric unit for safety, stabilization and treatment  -- Daily contact with patient to assess and evaluate symptoms and progress in treatment  -- Patient's case to be discussed in multi-disciplinary team meeting  -- Observation Level : q15 minute checks  -- Vital signs:  q12 hours  -- Precautions: suicide, elopement, and assault  2. Psychiatric Diagnoses and Treatment:    Schizophrenia by hx -Continued Haldol 5 mg qAM & 15 mg qPM for psychosis -Continued Clozaril 75 mg nightly for psychosis - WBC 6.5, ANC 3.5, Troponin 3, CRP 2 (2/10) - repeat labs tonight - Clozaril level 187, NorClozapine 87, and total Cloz + Nor-cloz level 273 (2/5) - QTC 439m on (2/7) - will repeat 05/15/20 - A1c 9.2 (1/27) and lipids WNL in 11/22 - Continue senokot 2 tabs daily for Clozaril-induced constipation  -Continue Lithium 450 mg BID for mood stability - Li level 0.67 (2/3) - Cr (!) 1.28 (2/10)- trending while on Lithium and po hydration encouraged - repeating tonight - TSH 2.233 (1/31) - ACTT referral made  Cognitive impairment (r/o major neurocognitive d/o) MOCA: 13/30  MMSE: 14/30 Pillbox test: Failed (per OT patient is not able to manage his own medication. Patient will need 24/7 caregiver support to assist with IADLs and medication management to be successful)  - Ongoing concern for ability to live independently and to complete IADLs and manage his insulin regimen and medications- currently patient refuses to live in assisted living or [name] home but shows no insight or understanding into his complex medication regimen, the risks associated with not being medication compliant, nor his medical or psychiatric diagnoses.. APS report accepted and guardianship recommended by medical providers - Follow-up on patient guardianship recommendations - Continued Aricept 5 mg qHS for poor memory    3. Medical Issues Being Addressed:   T2DM with insulin  dependence - A1C of 9.2. Diabetic coordinator on-board for insulin management -Continued insulin per diabetic coordinator with 70/30 52u with breakfast and continue 70/30 35u with supper  - Change SSI to sensitive coverage per recommendations of diabetic coordinator - Continued HS coverage -Placed on unit restriction for meals to monitor portions and carb choices   HTN -Lisinopril 2573mdaily (reduced due to concern for creatinine) -Amlodipine 10 mg daily -Clonidine 0.2 mg BID -Atenolol 12.73m24maily  Elevated transaminases, down trending - AST 48 and ALT 75 (2/10); Alk phos  136 (2/10) - Med consulted, stated that no further work up necessary - Hepatitis panel nonreactive, ammonia 35, negative RUQ u/s 02/10/21 - Will need outpatient f/u after discharge  Mild Hyponatremia - Na+ 133 - rechecking CMP tonight  Elevated Creatinine - 1.28 (2/10) - Rechecking CMP tonight and encouraging po hydration  Anemia (H/H 11.8/37) - Checking Ferritin, TIBC/iron, folate, and B12 tonight  4. Discharge Planning:   -- Social work and case management to assist with discharge planning and identification of hospital follow-up needs prior to discharge  -- Discharge Concerns: Need to establish safe housing option given his need for assistance with ADLs and medications - need for guardianship and potential ALF/[name] home referral; ACTT referral  -- Discharge Goals: Return home with outpatient referrals for mental health follow-up including medication management/psychotherapy   Aleshia Cartelli Artis Delay, MD, [facility]

## 2021-05-15 NOTE — Progress Notes (Signed)
Pt was encouraged but didn't attend orientation/goals group. ?

## 2021-05-15 NOTE — Group Note (Signed)
Recreation Therapy Group Note   Group Topic:Problem Solving  Group Date: 05/15/2021 Start Time: 1000 End Time: 1100 Facilitators: Victorino Sparrow, LRT,CTRS Location: [address] Dayroom   Goal Area(s) Addresses:  Patient will effectively work with peer towards shared goal.  Patient will identify skills used to make activity successful.  Patient will share challenges and verbalize solution-driven approaches used. Patient will identify how skills used during activity can be used to reach post d/c goals.    Group Description: [employer]. Patients were provided the following materials: 2 drinking straws, 5 rubber bands, 5 paper clips, 2 index cards and 2 drinking cups. Using the provided materials patients were asked to build a launching mechanism to launch a ping pong ball across the room, approximately 10 feet. Patients were divided into teams of 3-5. Instructions required all materials be incorporated into the device, functionality of items left to the peer group's discretion.   Affect/Mood: N/A   Participation Level: Did not attend    Clinical Observations/Individualized Feedback:     Plan: Continue to engage patient in RT group sessions 2-3x/week.   Victorino Sparrow, Glennis Brink 05/15/2021 12:58 PM

## 2021-05-16 LAB — GLUCOSE, CAPILLARY
Glucose-Capillary: 99 mg/dL (ref 70–99)
Glucose-Capillary: 234 mg/dL — ABNORMAL HIGH (ref 70–99)
Glucose-Capillary: 87 mg/dL (ref 70–99)
Glucose-Capillary: 179 mg/dL — ABNORMAL HIGH (ref 70–99)

## 2021-05-16 NOTE — Progress Notes (Addendum)
[facility] MD Progress Note   05/16/2021 8:24 AM Mitchell Mitchell  MRN:  [id number]  Chief Complaint: delusions and SI  Reason for Admission: Mitchell Mitchell a 61 y.o. male with PPHx of Schizophrenia and multiple hospitalizations, who presented to [facility] via IVC for delusion that he Mitchell Mitchell and Mitchell Mitchell with threats to jump into a trash compactor. He was then admitted Involuntary to [facility] for treatment of schizophrenia exacerbated by medication selective adherence.  Chart Review from last 24 hours:  The patient's chart was reviewed and nursing notes were reviewed. The patient's case was discussed in multidisciplinary team meeting. Per nursing, patient was allowed off the unit for meals and had elevated FSBS after making his own meal selection. He did not attend groups. Per [facility] he was compliant with meds and did not require PRNs.  Information Obtained Today During Patient Interview: The patient was seen and evaluated on the unit.  He voices no physical complaints and denies SI or HI.  He reports stable sleep and appetite.  He denies medication side effects and specifically denies issues with drooling, constipation, palpitations, chest pain, dizziness, urinary symptoms or shortness of breath.  He denies AVH, ideas of reference, paranoia, or first rank symptoms but continues to believe that he Mitchell "Mitchell Mitchell".  When asked who Mitchell Mitchell, he states "I do not know who that man Mitchell."  He states that he Mitchell compliant with his medications and time was given for questions.  Principal Problem: Schizophrenia ([place]) Diagnosis: Principal Problem:   Schizophrenia ([place]) Active Problems:   Hypertension   Type 2 diabetes mellitus ([place])  Total Time Spent in Direct Patient Care: I personally spent 25 minutes on the unit in direct patient care. The direct patient care time included face-to-face time with the patient, reviewing the patient's chart, communicating with other professionals,  and coordinating care. Greater than 50% of this time was spent in counseling or coordinating care with the patient regarding goals of hospitalization, psycho-education, and discharge planning needs.  Past Psychiatric History: see H&P  Past Medical History:  Past Medical History:  Diagnosis Date   Bipolar affective disorder ([place])    takes Zyprexa daily   Coarse tremors 10/02/2014   Diabetes mellitus    takes Victoza,Metformin,and Glipizide daily   Hypertension    takes Amlodipine,Lisinopril and Clonidine daily   Hyponatremia    history of   Lithium toxicity 10/02/2014   Mental disorder    takes Lithium daily   Schizoaffective disorder    takes Trazodone nightly   Schizoaffective disorder ([place]) 01/04/2019   Seasonal allergies    takes Claritin daily   Sleep apnea    sleep study >59yr ago   Stroke ([facility]    left arm weakness    Past Surgical History:  Procedure Laterality Date   CATARACT EXTRACTION W/PHACO Right 02/14/2013   Procedure: CATARACT EXTRACTION PHACO AND INTRAOCULAR LENS PLACEMENT ([place];  Surgeon: GAdonis Brook MD;  [place]  Service: Ophthalmology;  Laterality: Right;   CATARACT EXTRACTION W/PHACO Left 06/13/2013   Procedure: CATARACT EXTRACTION PHACO AND INTRAOCULAR LENS PLACEMENT (IOC);  Surgeon: GAdonis Brook MD;  Location[place]  Service: Ophthalmology;  Laterality: Left;   CIRCUMCISION  20 yrs. ago   EYE SURGERY     Family History:see H&P  Family Psychiatric History: unable to obtain  Social History:  Social History   Substance and Sexual Activity  Alcohol Use Not Currently  Social History   Substance and Sexual Activity  Drug Use No    Social History   Socioeconomic History   Marital status: Divorced    Spouse name: Not on file   Number of children: Not on file   Years of education: Not on file   Highest education level: Not on file  Occupational History   Not on file  Tobacco Use   Smoking status: Never   Smokeless tobacco: Never   Vaping Use   Vaping Use: Never used  Substance and Sexual Activity   Alcohol use: Not Currently   Drug use: No   Sexual activity: Yes    Birth control/protection: None  Other Topics Concern   Not on file  Social History Narrative   Not on file   Social Determinants of Health   Financial Resource Strain: Not on file  Food Insecurity: Not on file  Transportation Needs: Not on file  Physical Activity: Not on file  Stress: Not on file  Social Connections: Not on file   Sleep: Good  Appetite: Good  Current Medications: Current Facility-Administered Medications  Medication Dose Route Frequency Provider Last Rate Last Admin   acetaminophen (TYLENOL) tablet 650 mg  650 mg Oral Q6H PRN Lucky Rathke, FNP       alum & mag hydroxide-simeth (MAALOX/MYLANTA) 200-200-20 MG/5ML suspension 30 mL  30 mL Oral Q4H PRN Lucky Rathke, FNP       amLODipine (NORVASC) tablet 10 mg  10 mg Oral QHS Merrily Brittle, DO   10 mg at 05/15/21 2035   aspirin EC tablet 81 mg  81 mg Oral Daily Lucky Rathke, FNP   81 mg at 05/15/21 0826   atenolol (TENORMIN) tablet 12.5 mg  12.5 mg Oral Daily Lucky Rathke, FNP   12.5 mg at 05/15/21 0827   chewing gum (ORBIT) sugar free  1 Stick Oral TID PRN Merrily Brittle, DO       cloNIDine (CATAPRES) tablet 0.2 mg  0.2 mg Oral BID Lucky Rathke, FNP   0.2 mg at 05/15/21 1732   cloZAPine (CLOZARIL) tablet 75 mg  75 mg Oral QHS Merrily Brittle, DO   75 mg at 05/15/21 2034   donepezil (ARICEPT) tablet 5 mg  5 mg Oral QHS Merrily Brittle, DO   5 mg at 05/15/21 2035   haloperidol (HALDOL) tablet 15 mg  15 mg Oral QHS Viann Fish E, MD   15 mg at 05/15/21 2035   haloperidol (HALDOL) tablet 5 mg  5 mg Oral Daily Nelda Marseille, Daxen Lanum E, MD   5 mg at 05/15/21 0827   insulin aspart (novoLOG) injection 0-5 Units  0-5 Units Subcutaneous QHS Viann Fish E, MD   4 Units at 05/15/21 2036   insulin aspart (novoLOG) injection 0-9 Units  0-9 Units Subcutaneous TID WC Viann Fish E, MD   5  Units at 05/15/21 1730   insulin aspart protamine- aspart (NOVOLOG MIX 70/30) injection 35 Units  35 Units Subcutaneous Q supper Merrily Brittle, DO   35 Units at 05/15/21 1730   insulin aspart protamine- aspart (NOVOLOG MIX 70/30) injection 52 Units  52 Units Subcutaneous Q breakfast Merrily Brittle, DO   52 Units at 05/15/21 0832   lisinopril (ZESTRIL) tablet 20 mg  20 mg Oral QHS Nelda Marseille, Daija Routson E, MD   20 mg at 05/15/21 2035   lithium carbonate (ESKALITH) CR tablet 450 mg  450 mg Oral Q12H Lucky Rathke, FNP   450 mg at  05/15/21 2035   OLANZapine zydis (ZYPREXA) disintegrating tablet 10 mg  10 mg Oral Q8H PRN Corky Sox, MD       And   LORazepam (ATIVAN) tablet 1 mg  1 mg Oral PRN Corky Sox, MD       And   ziprasidone (GEODON) injection 20 mg  20 mg Intramuscular PRN Corky Sox, MD       magnesium hydroxide (MILK OF MAGNESIA) suspension 30 mL  30 mL Oral Daily PRN Lucky Rathke, FNP       senna (SENOKOT) tablet 17.2 mg  2 tablet Oral Daily Merrily Brittle, DO   17.2 mg at 05/15/21 8315    Lab Results:  Results for orders placed or performed during the hospital encounter of 04/23/21 (from the past 48 hour(s))  Glucose, capillary     Status: Abnormal   Collection Time: 05/14/21 11:49 AM  Result Value Ref Range   Glucose-Capillary 200 (H) 70 - 99 mg/dL    Comment: Glucose reference range applies only to samples taken after fasting for at least 8 hours.  Glucose, capillary     Status: Abnormal   Collection Time: 05/14/21  4:48 PM  Result Value Ref Range   Glucose-Capillary 225 (H) 70 - 99 mg/dL    Comment: Glucose reference range applies only to samples taken after fasting for at least 8 hours.  Glucose, capillary     Status: Abnormal   Collection Time: 05/14/21  7:29 PM  Result Value Ref Range   Glucose-Capillary 297 (H) 70 - 99 mg/dL    Comment: Glucose reference range applies only to samples taken after fasting for at least 8 hours.  Glucose, capillary     Status: Abnormal    Collection Time: 05/15/21  5:24 AM  Result Value Ref Range   Glucose-Capillary 147 (H) 70 - 99 mg/dL    Comment: Glucose reference range applies only to samples taken after fasting for at least 8 hours.  Glucose, capillary     Status: Abnormal   Collection Time: 05/15/21 11:56 AM  Result Value Ref Range   Glucose-Capillary 146 (H) 70 - 99 mg/dL    Comment: Glucose reference range applies only to samples taken after fasting for at least 8 hours.  Glucose, capillary     Status: Abnormal   Collection Time: 05/15/21  4:50 PM  Result Value Ref Range   Glucose-Capillary 262 (H) 70 - 99 mg/dL    Comment: Glucose reference range applies only to samples taken after fasting for at least 8 hours.  CBC with Differential/Platelet     Status: Abnormal   Collection Time: 05/15/21  6:30 PM  Result Value Ref Range   WBC 6.2 4.0 - 10.5 K/uL   RBC 3.88 (L) 4.22 - 5.81 MIL/uL   Hemoglobin 12.1 (L) 13.0 - 17.0 g/dL   HCT 36.3 (L) 39.0 - 52.0 %   MCV 93.6 80.0 - 100.0 fL   MCH 31.2 26.0 - 34.0 pg   MCHC 33.3 30.0 - 36.0 g/dL   RDW 13.0 11.5 - 15.5 %   Platelets 246 150 - 400 K/uL   nRBC 0.0 0.0 - 0.2 %   Neutrophils Relative % 53 %   Neutro Abs 3.3 1.7 - 7.7 K/uL   Lymphocytes Relative 34 %   Lymphs Abs 2.1 0.7 - 4.0 K/uL   Monocytes Relative 7 %   Monocytes Absolute 0.5 0.1 - 1.0 K/uL   Eosinophils Relative 4 %   Eosinophils Absolute 0.2 0.0 -  0.5 K/uL   Basophils Relative 1 %   Basophils Absolute 0.0 0.0 - 0.1 K/uL   Immature Granulocytes 1 %   Abs Immature Granulocytes 0.06 0.00 - 0.07 K/uL    Comment: Performed at [facility], [place] [address], [place], [place] [address]  Comprehensive metabolic panel     Status: Abnormal   Collection Time: 05/15/21  6:30 PM  Result Value Ref Range   Sodium 132 (L) 135 - 145 mmol/L   Potassium 4.5 3.5 - 5.1 mmol/L   Chloride 99 98 - 111 mmol/L   CO2 27 22 - 32 mmol/L   Glucose, Bld 290 (H) 70 - 99 mg/dL    Comment: Glucose reference  range applies only to samples taken after fasting for at least 8 hours.   BUN 17 6 - 20 mg/dL   Creatinine, Ser 1.27 (H) 0.61 - 1.24 mg/dL   Calcium 9.0 8.9 - 10.3 mg/dL   Total Protein 7.5 6.5 - 8.1 g/dL   Albumin 3.7 3.5 - 5.0 g/dL   AST 76 (H) 15 - 41 U/L   ALT 94 (H) 0 - 44 U/L   Alkaline Phosphatase 161 (H) 38 - 126 U/L   Total Bilirubin 0.3 0.3 - 1.2 mg/dL   GFR, Estimated >60 >60 mL/min    Comment: (NOTE) Calculated using the CKD-EPI Creatinine Equation (2021)    Anion gap 6 5 - 15    Comment: Performed at [facility], [place] [address], [place], [place] [address]  C-reactive protein     Status: Abnormal   Collection Time: 05/15/21  6:30 PM  Result Value Ref Range   CRP 2.1 (H) <1.0 mg/dL    Comment: Performed at [place] [address] [place], [place] [address]  Vitamin B12     Status: None   Collection Time: 05/15/21  6:30 PM  Result Value Ref Range   Vitamin B-12 366 180 - 914 pg/mL    Comment: (NOTE) This assay Mitchell not validated for testing neonatal or myeloproliferative syndrome specimens for Vitamin B12 levels. Performed at [facility], [place] [address] [place], [place] [address]   Folate, serum, performed at [facility] lab     Status: None   Collection Time: 05/15/21  6:30 PM  Result Value Ref Range   Folate 10.5 >5.9 ng/mL    Comment: Performed at [facility], [place] [address], [place], [place] [address]  Iron and TIBC     Status: None   Collection Time: 05/15/21  6:30 PM  Result Value Ref Range   Iron 77 45 - 182 ug/dL   TIBC 390 250 - 450 ug/dL   Saturation Ratios 20 17.9 - 39.5 %   UIBC 313 ug/dL    Comment: Performed at [facility], [place] [address] [place], [place] [address]  Ferritin     Status: None   Collection Time: 05/15/21  6:30 PM  Result Value Ref Range   Ferritin 80 24 - 336 ng/mL    Comment: Performed at [facility], [place] [address] [place], [place] [address]  Lithium level     Status: None   Collection Time: 05/15/21  6:30 PM  Result Value Ref Range   Lithium Lvl 0.89 0.60 - 1.20 mmol/L    Comment: Performed at [facility], [place] [address], [place], [place] [address]  Troponin I (High Sensitivity)     Status: None  Collection Time: 05/15/21  6:30 PM  Result Value Ref Range   Troponin I (High Sensitivity) 2 <18 ng/L    Comment: (NOTE) Elevated high sensitivity troponin I (hsTnI) values and significant  changes across serial measurements may suggest ACS but many other  chronic and acute conditions are known to elevate hsTnI results.  Refer to the "Links" section for chest pain algorithms and additional  guidance. Performed at [facility], [place] [address], [place], [place] [address]   Glucose, capillary     Status: Abnormal   Collection Time: 05/15/21  7:50 PM  Result Value Ref Range   Glucose-Capillary 307 (H) 70 - 99 mg/dL    Comment: Glucose reference range applies only to samples taken after fasting for at least 8 hours.  Glucose, capillary     Status: None   Collection Time: 05/16/21  5:32 AM  Result Value Ref Range   Glucose-Capillary 99 70 - 99 mg/dL    Comment: Glucose reference range applies only to samples taken after fasting for at least 8 hours.    Blood Alcohol level:  Lab Results  Component Value Date   ETH <10 04/16/2021   ETH <10 91/50/5697    Metabolic Disorder Labs: Lab Results  Component Value Date   HGBA1C 9.2 (H) 04/24/2021   MPG 217.34 04/24/2021   MPG 271.87 01/26/2021   No results found for: PROLACTIN Lab Results  Component Value Date   CHOL 140 01/30/2021   TRIG 95 01/30/2021   HDL 49 01/30/2021   CHOLHDL 2.9 01/30/2021   VLDL 19 01/30/2021   LDLCALC 72 01/30/2021   LDLCALC 67 05/20/2020   Psychiatric Specialty Exam: Physical Exam Vitals and nursing note reviewed.  Constitutional:      Appearance: He Mitchell not diaphoretic.   HENT:     Head: Normocephalic.  Pulmonary:     Effort: Pulmonary effort Mitchell normal. No respiratory distress.  Neurological:     General: No focal deficit present.     Mental Status: He Mitchell alert.  Psychiatric:        Behavior: Behavior Mitchell cooperative.     Review of Systems  Respiratory:  Negative for shortness of breath.   Cardiovascular:  Negative for chest pain.  Gastrointestinal:  Negative for nausea and vomiting.  Neurological:  Negative for dizziness and headaches.    Body mass index Mitchell 37.42 kg/m. Temp:  [98.7 F (37.1 C)] 98.7 F (37.1 C) (02/18 9480) Pulse Rate:  [77-88] 88 (02/18 0633) BP: (116-138)/(71-81) 116/71 (02/18 0633) SpO2:  [99 %-100 %] 99 % (02/18 0632)  General Appearance:  More disheveled appearing today wearing dirty scrubs and hair uncombed, obese    Eye Contact:   Minimal    Speech:   Rambling and mumbling quality, answers abruptly with short responses to direct questions - nonspontaneous    Volume:   Normal    Mood:   Described as "good" - appears aloof and ambivalent    Affect:   Constricted     Thought Process:  Concrete but linear  Duration of Psychotic Symptoms:  Greater than six months  Past Diagnosis of Schizophrenia or Psychoactive disorder:  Yes   Orientation:   Would not cooperate for questioning today   Thought Content:   Has residual fixed delusion that he Mitchell "Mitchell Mitchell"; he denies AVH, paranoia, ideas of reference or first rank symptoms.  He Mitchell not grossly responding to internal or external stimuli on exam.  He denies SI or HI.  Overall poverty of thought  Hallucinations: Denied  Ideas of Reference: None   Suicidal Thoughts:   No   Homicidal Thoughts:   No    Memory:   Immediate - fair; recent and remote poor    Judgement:   Poor    Insight:   Poor    Psychomotor Activity:   Normal gait, no tremor noted, no akathisias    Concentration:   Fair    Attention Span:   Fair   Recall:   Poor     Fund of Knowledge:   Limited    Language:   Fair    Assets:   Leisure Time    Sleep:   5.25 hours  AIMS: Facial and Oral Movements Muscles of Facial Expression: None, normal Lips and Perioral Area: None, normal Jaw: None, normal Tongue: None, normal,Extremity Movements Upper (arms, wrists, hands, fingers): None, normal Lower (legs, knees, ankles, toes): None, normal, Trunk Movements Neck, shoulders, hips: None, normal, Overall Severity Severity of abnormal movements (highest score from questions above): None, normal Incapacitation due to abnormal movements: None, normal Patient's awareness of abnormal movements (rate only patient's report): No Awareness, Dental Status Current problems with teeth and/or dentures?: No Does patient usually wear dentures?: No   AIMS 0 (2/13 and on 2/16) MOCA: 13/30  MMSE: 14/30 Pillbox test: Failed    ASSESSMENT: Diagnoses / Active Problems: Schizophrenia by hx Type II DM HTN Hyponatremia Elevated Creatinine High risk medication use Elevated transaminases Anemia Cognitive impairment (r/o major neurocognitive d/o)  PLAN: Safety and Monitoring:  -- Voluntary admission to inpatient psychiatric unit for safety, stabilization and treatment  -- Daily contact with patient to assess and evaluate symptoms and progress in treatment  -- Patient's case to be discussed in multi-disciplinary team meeting  -- Observation Level : q15 minute checks  -- Vital signs:  q12 hours  -- Precautions: suicide, elopement, and assault  2. Psychiatric Diagnoses and Treatment:   Schizophrenia by hx -Continued Haldol 5 mg qAM & 15 mg qPM for psychosis -Continued Clozaril 75 mg nightly for psychosis - WBC 6.2 , ANC 3300, Troponin 2, CRP 2.1 (2/17) - repeat labs due 2/24 - Clozaril level 187, NorClozapine 87, and total Cloz + Nor-cloz level 273 (2/5) - QTC 426m on (2/7) - repeat pending - A1c 9.2 (1/27) and lipids WNL in 11/22 - Continue senokot 2 tabs daily  for Clozaril-induced constipation  -Continue Lithium 450 mg BID for mood stability - Li level 0.89 (2/17) - Cr (!) 1.27 (2/17)- trending while on Lithium and po hydration encouraged  - TSH 2.233 (1/31) - ACTT referral made  Cognitive impairment (r/o major neurocognitive d/o) MOCA: 13/30  MMSE: 14/30 Pillbox test: Failed (per OT patient Mitchell not able to manage his own medication. Patient will need 24/7 caregiver support to assist with IADLs and medication management to be successful)  - Ongoing concern for ability to live independently and to complete IADLs and manage his insulin regimen and medications- currently patient refuses to live in assisted living or Mitchell home but shows no insight or understanding into his complex medication regimen, the risks associated with not being medication compliant, nor his medical or psychiatric diagnoses.. APS report accepted and guardianship recommended by medical providers - Follow-up on patient guardianship recommendations - Continued Aricept 5 mg qHS for poor memory    3. Medical Issues Being Addressed:   T2DM with insulin dependence - A1C of 9.2. Diabetic coordinator on-board for insulin management -Continued insulin per diabetic coordinator with  70/30 52u with breakfast and continue 70/30 35u with supper  - Change SSI to sensitive coverage per recommendations of diabetic coordinator - Continued HS coverage -Placed on unit restriction for meals to monitor portions and carb choices   HTN -Lisinopril 97m daily (reduced due to concern for creatinine) -Amlodipine 10 mg daily -Clonidine 0.2 mg BID -Atenolol 12.557mdaily  Elevated transaminases, down trending - AST 76 and ALT 94 (2/17); Alk phos 161 (2/17) - Med consulted, stated that no further work up necessary - Hepatitis panel nonreactive, ammonia 35, negative RUQ u/s 02/10/21 - Will need outpatient f/u after discharge  Mild Hyponatremia - Na+ 132  on 2/17 - continue to trend  Elevated  Creatinine - 1.27 (2/17) - encouraging po hydration and continue to trend  Anemia  - Repeat H/H up to 12.1/36.3 - Ferritin 80, Iron/TIBC WNL, Folate 10.5, B12 366 - F/u with PCP after discharge  4. Discharge Planning:   -- Social work and case management to assist with discharge planning and identification of hospital follow-up needs prior to discharge  -- Discharge Concerns: Need to establish safe housing option given his need for assistance with ADLs and medications - need for guardianship and potential ALF/Mitchell home referral; ACTT referral  -- Discharge Goals: Return home with outpatient referrals for mental health follow-up including medication management/psychotherapy   Estell Dillinger E Artis DelayMD, FAAlda Ponder

## 2021-05-16 NOTE — Progress Notes (Signed)
D:  Mitchell [name] had no complaints this evening.  He was in the day room much of the evening.  He did attend evening wrap up group.  He denied SI/HI or AVH.  His HS blood sugar was 179 and no insulin coverage needed.  He denied any pain or discomfort and appeared to be in no physical distress.  No prn's needed at this time. A:  1:1 with RN for support and encouragement.  Medications given as ordered.  Q 15 minute checks maintained for safety.  Encouraged participation in group and unit activities.   R:  He is currently resting with his eyes closed and appears to be asleep.  He remains safe on the unit.  We will continue to monitor the progress towards his goals.

## 2021-05-16 NOTE — [facility] Group Notes (Signed)
Goals Group °05/16/2021 ° ° °Group Focus: affirmation, clarity of thought, and goals/reality orientation °Treatment Modality:  Psychoeducation °Interventions utilized were assignment, group exercise, and support °Purpose: To be able to understand and verbalize the reason for their admission to the hospital. To understand that the medication helps with their chemical imbalance but they also need to work on their choices in life. To be challenged to develop a list of 30 positives about themselves. Also introduce the concept that "feelings" are not reality. ° °Participation Level:  did not attend ° °Mitchell [name] A °

## 2021-05-16 NOTE — Progress Notes (Signed)
Adult Psychoeducational Group Note  Date:  05/16/2021 Time:  11:33 PM  Group Topic/Focus:  Wrap-Up Group:   The focus of this group is to help patients review their daily goal of treatment and discuss progress on daily workbooks.  Participation Level:  Active  Participation Quality:  Appropriate  Affect:  Appropriate  Cognitive:  Appropriate  Insight: Appropriate  Engagement in Group:  Engaged  Modes of Intervention:  Discussion  Additional Comments:  Pt stated his goal for today was to focus on his treatment plan. Pt stated he accomplished his goal today. Pt stated he talked with his doctor and with his social worker about his care today. Pt rated his overall day a 10.  Pt stated he made no calls today. Pt stated he felt better about himself today. Pt stated staff brought back all meals for him today because of his blood sugar issues. Pt stated he took all medications provided today. Pt stated his appetite was pretty good today. Pt rated sleep last night was pretty good. Pt stated the goal tonight was to get some rest. Pt stated he had no physical pain tonight. Pt deny visual hallucinations and auditory issues tonight. Pt denies thoughts of harming himself or others. Pt stated he would alert staff if anything changed   [name] 05/16/2021, 11:33 PM

## 2021-05-16 NOTE — Progress Notes (Signed)
Pt remains delusional in his thought process. Continues to believe that he is [employer]. Told Clinical research associate at medication window earlier "I am not [name], I don't know why y'all keep calling me that. Edge Mauger is a murderer, that man murdered lot of people in different counties. Call the police they will tell you". Cooperative with care and unit routines. Visible in dayroom at intervals for leisure activities.  Support and reassurance provided to pt. All medications administered with verbal education and effects monitored. Pt encouraged to voice concerns. Tolerates all PO intake well. Remains medication compliant without adverse drug reactions.

## 2021-05-16 NOTE — Group Note (Signed)
LCSW Group Therapy Note  05/16/2021    1:30pm-2:15pm  Type of Therapy and Topic:  Group Therapy: Anger and Coping Skills  Participation Level:  Did Not Attend   Description of Group:   In this group, patients identified one thing (or more) that often angers them and shared how they usually or often react.  They discussed the differences between healthy and unhealthy coping skills.  They analyzed how their frequently-chosen coping skill is possibly beneficial and how it is possibly unhelpful.  As a whole the group was disorganized so a variety of topics were covered before ending group.  Therapeutic Goals: Patients will identify thing(s) that makes them angry and how they often respond Patients will identify how their coping technique works for them, as well as how it works against them. Patients will be given the opportunity to express themselves freely and to hear from others doing the same. Patients will benefit from a nonjudgmental atmosphere where they are listened to and supported  Summary of Patient Progress:  The patient was invited to group, did not attend  Therapeutic Modalities:   Cognitive Behavioral Therapy Motivation Interviewing  Lynnell Chad  .

## 2021-05-17 LAB — GLUCOSE, CAPILLARY
Glucose-Capillary: 131 mg/dL — ABNORMAL HIGH (ref 70–99)
Glucose-Capillary: 197 mg/dL — ABNORMAL HIGH (ref 70–99)
Glucose-Capillary: 127 mg/dL — ABNORMAL HIGH (ref 70–99)
Glucose-Capillary: 223 mg/dL — ABNORMAL HIGH (ref 70–99)

## 2021-05-17 NOTE — [facility] Group Notes (Signed)
Spirituality group facilitated by Kathleen Argue, [facility].   Group Description: Group focused on topic of hope. Patients participated in facilitated discussion around topic, connecting with one another around experiences and definitions for hope. Group members engaged with visual explorer photos, reflecting on what hope looks like for them today. Group engaged in discussion around how their definitions of hope are present today in hospital.   Modalities: Psycho-social ed, Adlerian, Narrative, MI   Patient Progress: Mitchell [name] came into group late and at that point was the only member of group.  Chaplain engaged him in a conversation about hope and gratitude and he expressed that he was grateful for each new day and that that also gave him hope.  [address] [facility] Pager, [phone]

## 2021-05-17 NOTE — Group Note (Signed)
[facility] LCSW Group Therapy Note ° °Date/Time:  05/17/2021  11:00AM-12:00PM ° °Type of Therapy and Topic:  Group Therapy:  Using Music to Encourage Yourself ° °Participation Level:  Did Not Attend  ° °Description of Group: °In this process group, members listened to a variety of music, some chosen by patients, and identified the messages received from those songs and how the music affected their emotions.  Patients were encouraged to use music as a coping skill at home, but to be mindful of the choices made.  Patients discussed how this knowledge can help with wellness and recovery in various ways including managing depression and anxiety as well as encouraging healthy sleep habits.   ° °Therapeutic Goals: °Patients will explore the impact of different songs on mood °Patients will verbalize the thoughts they have when listening to different types of music °Patients will identify music that is soothing to them as well as music that is energizing to them °Patients will discuss how to use this knowledge to assist in maintaining wellness and recovery °Patients will explore the use of music as a coping skill ° °Summary of Patient Progress:  The patient was invited to group, did not attend. ° °Therapeutic Modalities: °Solution Focused Brief Therapy °Activity ° ° °Tiara Maultsby Grossman-Orr, LCSW °  °

## 2021-05-17 NOTE — Progress Notes (Signed)
Adult Psychoeducational Group Note  Date:  05/17/2021 Time:  10:33 PM  Group Topic/Focus:  Wrap-Up Group:   The focus of this group is to help patients review their daily goal of treatment and discuss progress on daily workbooks.  Participation Level:  Active  Participation Quality:  Appropriate  Affect:  Appropriate  Cognitive:  Appropriate  Insight: Appropriate  Engagement in Group:  Engaged  Modes of Intervention:  Discussion  Additional Comments:  Pt stated his goal for today was to focus on his treatment plan. Pt stated he accomplished his goal today. Pt stated he talked with his doctor and with his social worker about his care today. Pt rated his overall day a 10.  Pt stated he made no calls today. Pt stated he felt better about himself today. Pt stated staff brought back all meals for him today because of his blood sugar issues. Pt stated he took all medications provided today. Pt stated his appetite was pretty good today. Pt rated sleep last night was pretty good. Pt stated the goal tonight was to get some rest. Pt stated he had no physical pain tonight. Pt deny visual hallucinations and auditory issues tonight. Pt denies thoughts of harming himself or others. Pt stated he would alert staff if anything changed   [name] 05/17/2021, 10:33 PM

## 2021-05-17 NOTE — Progress Notes (Signed)
D:  Mitchell [name] reported he had a good day.  He denied SI/HI or AVH.  He does not appear to be responding to internal stimuli.  HS blood sugar was 223 an required 2 units of insulin coverage.  Reminded him that he needs to watch his carbs.  He is concerned that his ACTT team hasn't visited him here and informed him to speak with the SW tomorrow about his concerns.     A:  1:1 with RN for support and encouragement.  Medications given as ordered.  Q 15 minute checks maintained for safety.  Encouraged participation in group and unit activities.   R:  He is currently resting with his eyes closed and appears to be asleep.  He remains safe on the unit.  We will continue to monitor the progress towards his goals.

## 2021-05-17 NOTE — [facility] Group Notes (Signed)
Adult Psychoeducational Group Not °Date:  05/17/2021 °Time:  0900-1045 °Group Topic/Focus: PROGRESSIVE RELAXATION. A group where deep breathing is taught and tensing and relaxation muscle groups is used. Imagery is used as well.  Pts are asked to imagine 3 pillars that hold them up when they are not able to hold themselves up. ° °Participation Level:  did not attend ° °Mitchell [name] A ° ° °

## 2021-05-17 NOTE — Progress Notes (Signed)
PLEASE DOCUMENT PATIENT'S SLEEP TIME IN A PROGRESS NOTE PER MD REQUEST.  THANK YOU. ° °

## 2021-05-17 NOTE — Progress Notes (Signed)
Patient stayed in bed most of the day, ate meals in his room.  Respirations even and unlabored.  No signs/symptoms of pain/distress noted on patient's face/body movements. Safety maintained with 15 minute checks.

## 2021-05-17 NOTE — Progress Notes (Addendum)
[facility] MD Progress Note   05/17/2021 7:50 AM [name]  MRN:  [id number]  Chief Complaint: delusions and SI  Reason for Admission: ASHTIN ROSNER is a 61 y.o. male with PPHx of Schizophrenia and multiple hospitalizations, who presented to [facility] via IVC for delusion that he is [name] with threats to jump into a trash compactor. He was then admitted Involuntary to [facility] for treatment of schizophrenia exacerbated by medication selective adherence.  Chart Review from last 24 hours:  The patient's chart was reviewed and nursing notes were reviewed. The patient's case was discussed in multidisciplinary team meeting. Per nursing, he did not attend nursing or social work group but did attend evening psychoeducation group.  He had no acute behavioral or safety concerns noted.  He was in the day room most of the evening and was compliant with medications.  No PRN meds were given.  Information Obtained Today During Patient Interview: The patient was seen and evaluated on the unit.  He appears more disheveled today and malodorous and was encouraged to clean his room and attend to his ADLs.  He states that he is sleeping and eating well and voices no physical complaints.  He specifically denies issues with constipation stating he moved his bowels today.  He denies urinary issues, palpitations, chest pain, shortness of breath dizziness or drooling.  When asked as to the reason he is in the hospital he recognizes that he is in a psychiatric unit but denies having any psychiatric condition.  He states he is not sure why he is still here and questions when he will be discharged.  I again explained our ongoing concerns about him being able to manage his medications and attend to his own ADLs without assistance and advised  that we are working with Adult Scientist, forensic and his CST team as well as potential for an ACT team to assist with discharge planning and aftercare  needs.  He denies SI, HI, AVH, paranoia, ideas of reference or first rank symptoms.  He continues to have delusional belief that he is [name] and states that Charls Custer is a man who has been killing people and who wants him to assume his name and go to jail for him.  Principal Problem: Schizophrenia ([place]) Diagnosis: Principal Problem:   Schizophrenia ([place]) Active Problems:   Hypertension   Type 2 diabetes mellitus ([place])  Total Time Spent in Direct Patient Care: I personally spent 25 minutes on the unit in direct patient care. The direct patient care time included face-to-face time with the patient, reviewing the patient's chart, communicating with other professionals, and coordinating care. Greater than 50% of this time was spent in counseling or coordinating care with the patient regarding goals of hospitalization, psycho-education, and discharge planning needs.  Past Psychiatric History: see H&P  Past Medical History:  Past Medical History:  Diagnosis Date   Bipolar affective disorder ([place])    takes Zyprexa daily   Coarse tremors 10/02/2014   Diabetes mellitus    takes Victoza,Metformin,and Glipizide daily   Hypertension    takes Amlodipine,Lisinopril and Clonidine daily   Hyponatremia    history of   Lithium toxicity 10/02/2014   Mental disorder    takes Lithium daily   Schizoaffective disorder    takes Trazodone nightly   Schizoaffective disorder ([place]) 01/04/2019   Seasonal allergies    takes Claritin daily   Sleep apnea    sleep study >84yr ago  Stroke [facility])    left arm weakness    Past Surgical History:  Procedure Laterality Date   CATARACT EXTRACTION W/PHACO Right 02/14/2013   Procedure: CATARACT EXTRACTION PHACO AND INTRAOCULAR LENS PLACEMENT (IOC);  Surgeon: Adonis Brook, MD;  Location: [place];  Service: Ophthalmology;  Laterality: Right;   CATARACT EXTRACTION W/PHACO Left 06/13/2013   Procedure: CATARACT EXTRACTION PHACO AND INTRAOCULAR LENS PLACEMENT (IOC);   Surgeon: Adonis Brook, MD;  Location: [place];  Service: Ophthalmology;  Laterality: Left;   CIRCUMCISION  20 yrs. ago   EYE SURGERY     Family History:see H&P  Family Psychiatric History: unable to obtain  Social History:  Social History   Substance and Sexual Activity  Alcohol Use Not Currently     Social History   Substance and Sexual Activity  Drug Use No    Social History   Socioeconomic History   Marital status: Divorced    Spouse name: Not on file   Number of children: Not on file   Years of education: Not on file   Highest education level: Not on file  Occupational History   Not on file  Tobacco Use   Smoking status: Never   Smokeless tobacco: Never  Vaping Use   Vaping Use: Never used  Substance and Sexual Activity   Alcohol use: Not Currently   Drug use: No   Sexual activity: Yes    Birth control/protection: None  Other Topics Concern   Not on file  Social History Narrative   Not on file   Social Determinants of Health   Financial Resource Strain: Not on file  Food Insecurity: Not on file  Transportation Needs: Not on file  Physical Activity: Not on file  Stress: Not on file  Social Connections: Not on file   Sleep: Good  Appetite: Good  Current Medications: Current Facility-Administered Medications  Medication Dose Route Frequency Provider Last Rate Last Admin   acetaminophen (TYLENOL) tablet 650 mg  650 mg Oral Q6H PRN Lucky Rathke, FNP       alum & mag hydroxide-simeth (MAALOX/MYLANTA) 200-200-20 MG/5ML suspension 30 mL  30 mL Oral Q4H PRN Lucky Rathke, FNP       amLODipine (NORVASC) tablet 10 mg  10 mg Oral QHS Merrily Brittle, DO   10 mg at 05/16/21 2107   aspirin EC tablet 81 mg  81 mg Oral Daily Lucky Rathke, FNP   81 mg at 05/16/21 0932   atenolol (TENORMIN) tablet 12.5 mg  12.5 mg Oral Daily Lucky Rathke, FNP   12.5 mg at 05/16/21 0932   chewing gum (ORBIT) sugar free  1 Stick Oral TID PRN Merrily Brittle, DO       cloNIDine  (CATAPRES) tablet 0.2 mg  0.2 mg Oral BID Lucky Rathke, FNP   0.2 mg at 05/16/21 1709   cloZAPine (CLOZARIL) tablet 75 mg  75 mg Oral QHS Merrily Brittle, DO   75 mg at 05/16/21 2104   donepezil (ARICEPT) tablet 5 mg  5 mg Oral QHS Merrily Brittle, DO   5 mg at 05/16/21 2104   haloperidol (HALDOL) tablet 15 mg  15 mg Oral QHS Viann Fish E, MD   15 mg at 05/16/21 2104   haloperidol (HALDOL) tablet 5 mg  5 mg Oral Daily Viann Fish E, MD   5 mg at 05/16/21 0933   insulin aspart (novoLOG) injection 0-5 Units  0-5 Units Subcutaneous QHS Harlow Asa, MD   4 Units  at 05/15/21 2036   insulin aspart (novoLOG) injection 0-9 Units  0-9 Units Subcutaneous TID WC Harlow Asa, MD   1 Units at 05/17/21 0725   insulin aspart protamine- aspart (NOVOLOG MIX 70/30) injection 35 Units  35 Units Subcutaneous Q supper Merrily Brittle, DO   35 Units at 05/16/21 1738   insulin aspart protamine- aspart (NOVOLOG MIX 70/30) injection 52 Units  52 Units Subcutaneous Q breakfast Merrily Brittle, DO   52 Units at 05/16/21 0936   lisinopril (ZESTRIL) tablet 20 mg  20 mg Oral QHS Nelda Marseille, Alison Breeding E, MD   20 mg at 05/16/21 2105   lithium carbonate (ESKALITH) CR tablet 450 mg  450 mg Oral Q12H Lucky Rathke, FNP   450 mg at 05/16/21 2105   OLANZapine zydis (ZYPREXA) disintegrating tablet 10 mg  10 mg Oral Q8H PRN Corky Sox, MD       And   LORazepam (ATIVAN) tablet 1 mg  1 mg Oral PRN Corky Sox, MD       And   ziprasidone (GEODON) injection 20 mg  20 mg Intramuscular PRN Corky Sox, MD       magnesium hydroxide (MILK OF MAGNESIA) suspension 30 mL  30 mL Oral Daily PRN Lucky Rathke, FNP       senna (SENOKOT) tablet 17.2 mg  2 tablet Oral Daily Merrily Brittle, DO   17.2 mg at 05/16/21 0932    Lab Results:  Results for orders placed or performed during the hospital encounter of 04/23/21 (from the past 48 hour(s))  Glucose, capillary     Status: Abnormal   Collection Time: 05/15/21 11:56 AM  Result Value  Ref Range   Glucose-Capillary 146 (H) 70 - 99 mg/dL    Comment: Glucose reference range applies only to samples taken after fasting for at least 8 hours.  Glucose, capillary     Status: Abnormal   Collection Time: 05/15/21  4:50 PM  Result Value Ref Range   Glucose-Capillary 262 (H) 70 - 99 mg/dL    Comment: Glucose reference range applies only to samples taken after fasting for at least 8 hours.  CBC with Differential/Platelet     Status: Abnormal   Collection Time: 05/15/21  6:30 PM  Result Value Ref Range   WBC 6.2 4.0 - 10.5 K/uL   RBC 3.88 (L) 4.22 - 5.81 MIL/uL   Hemoglobin 12.1 (L) 13.0 - 17.0 g/dL   HCT 36.3 (L) 39.0 - 52.0 %   MCV 93.6 80.0 - 100.0 fL   MCH 31.2 26.0 - 34.0 pg   MCHC 33.3 30.0 - 36.0 g/dL   RDW 13.0 11.5 - 15.5 %   Platelets 246 150 - 400 K/uL   nRBC 0.0 0.0 - 0.2 %   Neutrophils Relative % 53 %   Neutro Abs 3.3 1.7 - 7.7 K/uL   Lymphocytes Relative 34 %   Lymphs Abs 2.1 0.7 - 4.0 K/uL   Monocytes Relative 7 %   Monocytes Absolute 0.5 0.1 - 1.0 K/uL   Eosinophils Relative 4 %   Eosinophils Absolute 0.2 0.0 - 0.5 K/uL   Basophils Relative 1 %   Basophils Absolute 0.0 0.0 - 0.1 K/uL   Immature Granulocytes 1 %   Abs Immature Granulocytes 0.06 0.00 - 0.07 K/uL    Comment: Performed at [facility], [place] [address] [place], [place] [address]  Comprehensive metabolic panel     Status: Abnormal   Collection Time: 05/15/21  6:30 PM  Result Value Ref Range   Sodium 132 (L) 135 - 145 mmol/L   Potassium 4.5 3.5 - 5.1 mmol/L   Chloride 99 98 - 111 mmol/L   CO2 27 22 - 32 mmol/L   Glucose, Bld 290 (H) 70 - 99 mg/dL    Comment: Glucose reference range applies only to samples taken after fasting for at least 8 hours.   BUN 17 6 - 20 mg/dL   Creatinine, Ser 1.27 (H) 0.61 - 1.24 mg/dL   Calcium 9.0 8.9 - 10.3 mg/dL   Total Protein 7.5 6.5 - 8.1 g/dL   Albumin 3.7 3.5 - 5.0 g/dL   AST 76 (H) 15 - 41 U/L   ALT 94 (H) 0 - 44 U/L   Alkaline  Phosphatase 161 (H) 38 - 126 U/L   Total Bilirubin 0.3 0.3 - 1.2 mg/dL   GFR, Estimated >60 >60 mL/min    Comment: (NOTE) Calculated using the CKD-EPI Creatinine Equation (2021)    Anion gap 6 5 - 15    Comment: Performed at [facility], [place] [address] [place], [place] [address]  C-reactive protein     Status: Abnormal   Collection Time: 05/15/21  6:30 PM  Result Value Ref Range   CRP 2.1 (H) <1.0 mg/dL    Comment: Performed at [place] [address], [place], [place] [address]  Vitamin B12     Status: None   Collection Time: 05/15/21  6:30 PM  Result Value Ref Range   Vitamin B-12 366 180 - 914 pg/mL    Comment: (NOTE) This assay is not validated for testing neonatal or myeloproliferative syndrome specimens for Vitamin B12 levels. Performed at [facility], [place] [address], [place], [place] [address]   Folate, serum, performed at [facility] lab     Status: None   Collection Time: 05/15/21  6:30 PM  Result Value Ref Range   Folate 10.5 >5.9 ng/mL    Comment: Performed at [facility], [place] [address], [place], [place] [address]  Iron and TIBC     Status: None   Collection Time: 05/15/21  6:30 PM  Result Value Ref Range   Iron 77 45 - 182 ug/dL   TIBC 390 250 - 450 ug/dL   Saturation Ratios 20 17.9 - 39.5 %   UIBC 313 ug/dL    Comment: Performed at [facility], [place] [address], [place], [place] [address]  Ferritin     Status: None   Collection Time: 05/15/21  6:30 PM  Result Value Ref Range   Ferritin 80 24 - 336 ng/mL    Comment: Performed at [facility], [place] [address] [place], [place] [address]  Lithium level     Status: None   Collection Time: 05/15/21  6:30 PM  Result Value Ref Range   Lithium Lvl 0.89 0.60 - 1.20 mmol/L    Comment: Performed at [facility], [place] [address], [place], [place] [address]  Troponin I (High Sensitivity)      Status: None   Collection Time: 05/15/21  6:30 PM  Result Value Ref Range   Troponin I (High Sensitivity) 2 <18 ng/L    Comment: (NOTE) Elevated high sensitivity troponin I (hsTnI) values and significant  changes across serial measurements may suggest ACS but many other  chronic and acute conditions are known to elevate hsTnI results.  Refer to the "Links" section for chest pain algorithms and additional  guidance. Performed at [facility], [place] [address] [place], [place] [address]   Glucose, capillary     Status: Abnormal   Collection Time: 05/15/21  7:50 PM  Result Value Ref Range   Glucose-Capillary 307 (H) 70 - 99 mg/dL    Comment: Glucose reference range applies only to samples taken after fasting for at least 8 hours.  Glucose, capillary     Status: None   Collection Time: 05/16/21  5:32 AM  Result Value Ref Range   Glucose-Capillary 99 70 - 99 mg/dL    Comment: Glucose reference range applies only to samples taken after fasting for at least 8 hours.  Glucose, capillary     Status: Abnormal   Collection Time: 05/16/21 11:54 AM  Result Value Ref Range   Glucose-Capillary 234 (H) 70 - 99 mg/dL    Comment: Glucose reference range applies only to samples taken after fasting for at least 8 hours.  Glucose, capillary     Status: None   Collection Time: 05/16/21  5:12 PM  Result Value Ref Range   Glucose-Capillary 87 70 - 99 mg/dL    Comment: Glucose reference range applies only to samples taken after fasting for at least 8 hours.  Glucose, capillary     Status: Abnormal   Collection Time: 05/16/21  7:36 PM  Result Value Ref Range   Glucose-Capillary 179 (H) 70 - 99 mg/dL    Comment: Glucose reference range applies only to samples taken after fasting for at least 8 hours.  Glucose, capillary     Status: Abnormal   Collection Time: 05/17/21  6:08 AM  Result Value Ref Range   Glucose-Capillary 131 (H) 70 - 99 mg/dL    Comment: Glucose reference range  applies only to samples taken after fasting for at least 8 hours.    Blood Alcohol level:  Lab Results  Component Value Date   ETH <10 04/16/2021   ETH <10 28/41/3244    Metabolic Disorder Labs: Lab Results  Component Value Date   HGBA1C 9.2 (H) 04/24/2021   MPG 217.34 04/24/2021   MPG 271.87 01/26/2021   No results found for: PROLACTIN Lab Results  Component Value Date   CHOL 140 01/30/2021   TRIG 95 01/30/2021   HDL 49 01/30/2021   CHOLHDL 2.9 01/30/2021   VLDL 19 01/30/2021   LDLCALC 72 01/30/2021   LDLCALC 67 05/20/2020   Psychiatric Specialty Exam: Physical Exam Vitals and nursing note reviewed.  Constitutional:      Appearance: He is not diaphoretic.  HENT:     Head: Normocephalic.  Pulmonary:     Effort: Pulmonary effort is normal. No respiratory distress.  Neurological:     General: No focal deficit present.     Mental Status: He is alert.  Psychiatric:        Behavior: Behavior is cooperative.     Review of Systems  Respiratory:  Negative for shortness of breath.   Cardiovascular:  Negative for chest pain.  Gastrointestinal:  Negative for nausea and vomiting.  Neurological:  Negative for dizziness and headaches.    Body mass index is 37.42 kg/m. Temp:  [97.8 F (36.6 C)-98.6 F (37 C)] 98.6 F (37 C) (02/19 0623) Pulse Rate:  [73-81] 81 (02/19 0625) Resp:  [16] 16 (02/18 1605) BP: (107-120)/(59-76) 112/59 (02/19 0625) SpO2:  [99 %-100 %] 100 % (02/19 0623)  General Appearance:  More disheveled appearing today wearing dirty scrub pants, no top and malodorous  Eye Contact:   Minimal    Speech:   Rambling and mumbling quality, answers abruptly with short responses to direct questions - nonspontaneous    Volume:   Normal    Mood:   Described as "good" - appears aloof and ambivalent    Affect:   Constricted     Thought Process:  Concrete but linear with overall poverty of thought  Duration of Psychotic Symptoms:  Greater than six  months  Past Diagnosis of Schizophrenia or Psychoactive disorder:  Yes   Orientation:   Oriented to month, year and city and knows he is in a psychiatric hospital but not oriented to person or situation   Thought Content:   Has residual fixed delusion that he is "[name]" and that someone named Willoughby Doell is a murderer who wants him to assume his identity; he denies AVH, paranoia, ideas of reference or first rank symptoms.  He is not grossly responding to internal or external stimuli on exam.  He denies SI or HI.   Hallucinations: Denied  Ideas of Reference: None   Suicidal Thoughts:   No   Homicidal Thoughts:   No    Memory:   Immediate - fair; recent and remote poor    Judgement:   Poor    Insight:   Poor    Psychomotor Activity:   no tremor noted, no akathisias    Concentration:   Fair    Attention Span:   Fair   Recall:   Poor    Fund of Knowledge:   Limited    Language:   Fair    Assets:   Leisure Time    Sleep:   Total time unrecorded  AIMS: Facial and Oral Movements Muscles of Facial Expression: None, normal Lips and Perioral Area: None, normal Jaw: None, normal Tongue: None, normal,Extremity Movements Upper (arms, wrists, hands, fingers): None, normal Lower (legs, knees, ankles, toes): None, normal, Trunk Movements Neck, shoulders, hips: None, normal, Overall Severity Severity of abnormal movements (highest score from questions above): None, normal Incapacitation due to abnormal movements: None, normal Patient's awareness of abnormal movements (rate only patient's report): No Awareness, Dental Status Current problems with teeth and/or dentures?: No Does patient usually wear dentures?: No   AIMS 0 (2/13 and on 2/16) MOCA: 13/30  MMSE: 14/30 Pillbox test: Failed    ASSESSMENT: Diagnoses / Active Problems: Schizophrenia by hx Type II DM HTN Hyponatremia Elevated Creatinine High risk medication use Elevated  transaminases Anemia Cognitive impairment (r/o major neurocognitive d/o)  PLAN: Safety and Monitoring:  -- Voluntary admission to inpatient psychiatric unit for safety, stabilization and treatment  -- Daily contact with patient to assess and evaluate symptoms and progress in treatment  -- Patient's case to be discussed in multi-disciplinary team meeting  -- Observation Level : q15 minute checks  -- Vital signs:  q12 hours  -- Precautions: suicide, elopement, and assault  2. Psychiatric Diagnoses and Treatment:   Schizophrenia by hx -Continued Haldol 5 mg qAM & 15 mg qPM for psychosis -Continued Clozaril 75 mg nightly for psychosis - WBC 6.2 , ANC 3300, Troponin 2, CRP 2.1 (2/17) - repeat labs due 2/24 - Clozaril level 187, NorClozapine 87, and total Cloz + Nor-cloz level 273 (2/5) - QTC 410m on (2/7) - QTC 4255m(2/18) - A1c 9.2 (1/27) and lipids WNL in 11/22 - Continue senokot 2 tabs daily for Clozaril-induced constipation  -Continue Lithium 450 mg BID for mood stability - Li level 0.89 (2/17) - Cr (!Marland Kitchen  1.27 (2/17)- trending while on Lithium and po hydration encouraged  - TSH 2.233 (1/31) - ACTT referral made  Cognitive impairment (r/o major neurocognitive d/o) MOCA: 13/30  MMSE: 14/30 Pillbox test: Failed (per OT patient is not able to manage his own medication. Patient will need 24/7 caregiver support to assist with IADLs and medication management to be successful)  - Ongoing concern for ability to live independently and to complete IADLs and manage his insulin regimen and medications- currently patient refuses to live in assisted living or group home but shows no insight or understanding into his complex medication regimen, the risks associated with not being medication compliant, nor his medical or psychiatric diagnoses.. APS report accepted and guardianship recommended by medical providers - Follow-up on patient guardianship recommendations - Continued Aricept 5 mg qHS for  poor memory    3. Medical Issues Being Addressed:   T2DM with insulin dependence - A1C of 9.2. Diabetic coordinator on-board for insulin management -Continued insulin per diabetic coordinator with 70/30 52u with breakfast and continue 70/30 35u with supper  - Changed SSI to sensitive coverage per recommendations of diabetic coordinator - Continued HS coverage -Placed on unit restriction for meals to monitor portions and carb choices   HTN -Lisinopril 43m daily (reduced due to concern for creatinine) -Amlodipine 10 mg daily -Clonidine 0.2 mg BID -Atenolol 12.561mdaily  Elevated transaminases, down trending - AST 76 and ALT 94 (2/17); Alk phos 161 (2/17) - Med consulted, stated that no further work up necessary - Hepatitis panel nonreactive, ammonia 35, negative RUQ u/s 02/10/21 - Will need outpatient f/u after discharge  Mild Hyponatremia - Na+ 132  on 2/17 - continue to trend  Elevated Creatinine - 1.27 (2/17) - encouraging po hydration and continue to trend  Anemia  - Repeat H/H up to 12.1/36.3 - Ferritin 80, Iron/TIBC WNL, Folate 10.5, B12 366 - F/u with PCP after discharge  4. Discharge Planning:   -- Social work and case management to assist with discharge planning and identification of hospital follow-up needs prior to discharge  -- Discharge Concerns: Need to establish safe housing option given his need for assistance with ADLs and medications - need for guardianship and potential ALF/group home referral; ACTT referral  -- Discharge Goals: Return home with outpatient referrals for mental health follow-up including medication management/psychotherapy   Escarlet Saathoff E Artis DelayMD, FAAlda Ponder

## 2021-05-18 ENCOUNTER — Encounter (HOSPITAL_COMMUNITY): Payer: Self-pay

## 2021-05-18 LAB — GLUCOSE, CAPILLARY
Glucose-Capillary: 99 mg/dL (ref 70–99)
Glucose-Capillary: 202 mg/dL — ABNORMAL HIGH (ref 70–99)
Glucose-Capillary: 159 mg/dL — ABNORMAL HIGH (ref 70–99)
Glucose-Capillary: 313 mg/dL — ABNORMAL HIGH (ref 70–99)

## 2021-05-18 NOTE — Progress Notes (Signed)
Patient denies SI, HI and AVH this shift. Patient has been compliant with medications and has had no behavioral dyscontrol.  ° °Assess patient for safety, engage patient in 1:1 staff talks. Continue to monitor as planned.  ° °Patient able to contract for safety. Continue to monitor as planned.  °

## 2021-05-18 NOTE — Progress Notes (Signed)
[facility] MD Progress Note   05/18/2021 7:37 AM WINDELL MUSSON  MRN:  [id number]  Chief Complaint: delusions and SI  Reason for Admission: DELVIN HEDEEN is a 61 y.o. male with PPHx of Schizophrenia and multiple hospitalizations, who presented to [facility] via IVC for delusion that he is [name] with threats to jump into a trash compactor. He was then admitted Involuntary to [facility] for treatment of schizophrenia exacerbated by medication selective adherence.  Chart Review from last 24 hours:  The patient's chart was reviewed and nursing notes were reviewed. The patient's case was discussed in multidisciplinary team meeting. Per nursing, patient stayed in the bed for most of the day and did not attend social work or nursing groups.  He did engage in adult psychoeducation group.  He had no acute behavioral issues or safety concerns noted and nursing did not see him responding to internal stimuli on exam yesterday.  Information Obtained Today During Patient Interview: The patient was seen and evaluated on the unit.  He denies SI, HI, AVH, ideas of reference, paranoia, or first rank symptoms.  He continues to have delusion that he is [name] and states he does not know who Taylen Osorto is today.  He voices no physical complaints and specifically denies issues with constipation, drooling, chest pain, palpitations, urinary issues, or dizziness while on Clozaril.  He states he is sleeping and eating well.  He was encouraged to attend to his ADLs and was noted to be wearing a shirt with food stains on it today and have uncombed hair.  He claims he is showering but admits he has not been washing his clothes.  He denies medication side effects.  He questions when he will be discharged and time was spent explaining that we are attempting to get him ACT services and that Adult Protective Services is currently involved due to concern that he may need a guardian.  It was again  explained that given his insulin regimen and complex medication regimen, given his previous medication noncompliance, and given his MoCA screening and OT assessment that there is concern that he needs assistance with his medications and IADLs to be independent after discharge.  After extensive discussion he states he would be willing to consider an assisted living facility but he is upset to think that he would receive a guardian. Supportive encouragement provided.   Principal Problem: Schizophrenia ([place]) Diagnosis: Principal Problem:   Schizophrenia ([place]) Active Problems:   Hypertension   Type 2 diabetes mellitus ([place])  Total Time Spent in Direct Patient Care: I personally spent 30 minutes on the unit in direct patient care. The direct patient care time included face-to-face time with the patient, reviewing the patient's chart, communicating with other professionals, and coordinating care. Greater than 50% of this time was spent in counseling or coordinating care with the patient regarding goals of hospitalization, psycho-education, and discharge planning needs.  Past Psychiatric History: see H&P  Past Medical History:  Past Medical History:  Diagnosis Date   Bipolar affective disorder ([place])    takes Zyprexa daily   Coarse tremors 10/02/2014   Diabetes mellitus    takes Victoza,Metformin,and Glipizide daily   Hypertension    takes Amlodipine,Lisinopril and Clonidine daily   Hyponatremia    history of   Lithium toxicity 10/02/2014   Mental disorder    takes Lithium daily   Schizoaffective disorder    takes Trazodone nightly   Schizoaffective disorder ([place]) 01/04/2019  Seasonal allergies    takes Claritin daily   Sleep apnea    sleep study >59yr ago   Stroke ([place]    left arm weakness    Past Surgical History:  Procedure Laterality Date   CATARACT EXTRACTION W/PHACO Right 02/14/2013   Procedure: CATARACT EXTRACTION PHACO AND INTRAOCULAR LENS PLACEMENT (IOC);  Surgeon: GAdonis Brook MD;  [facility]  Service: Ophthalmology;  Laterality: Right;   CATARACT EXTRACTION W/PHACO Left 06/13/2013   Procedure: CATARACT EXTRACTION PHACO AND INTRAOCULAR LENS PLACEMENT (IOC);  Surgeon: GAdonis Brook MD;  [facility]  Service: Ophthalmology;  Laterality: Left;   CIRCUMCISION  20 yrs. ago   EYE SURGERY     Family History:see H&P  Family Psychiatric History: unable to obtain  Social History:  Social History   Substance and Sexual Activity  Alcohol Use Not Currently     Social History   Substance and Sexual Activity  Drug Use No    Social History   Socioeconomic History   Marital status: Divorced    Spouse name: Not on file   Number of children: Not on file   Years of education: Not on file   Highest education level: Not on file  Occupational History   Not on file  Tobacco Use   Smoking status: Never   Smokeless tobacco: Never  Vaping Use   Vaping Use: Never used  Substance and Sexual Activity   Alcohol use: Not Currently   Drug use: No   Sexual activity: Yes    Birth control/protection: None  Other Topics Concern   Not on file  Social History Narrative   Not on file   Social Determinants of Health   Financial Resource Strain: Not on file  Food Insecurity: Not on file  Transportation Needs: Not on file  Physical Activity: Not on file  Stress: Not on file  Social Connections: Not on file   Sleep: Good  Appetite: Good  Current Medications: Current Facility-Administered Medications  Medication Dose Route Frequency Provider Last Rate Last Admin   acetaminophen (TYLENOL) tablet 650 mg  650 mg Oral Q6H PRN ALucky Rathke FNP       alum & mag hydroxide-simeth (MAALOX/MYLANTA) 200-200-20 MG/5ML suspension 30 mL  30 mL Oral Q4H PRN ALucky Rathke FNP       amLODipine (NORVASC) tablet 10 mg  10 mg Oral QHS NMerrily Brittle DO   10 mg at 05/17/21 2041   aspirin EC tablet 81 mg  81 mg Oral Daily ALucky Rathke FNP   81 mg at 05/17/21 0900    atenolol (TENORMIN) tablet 12.5 mg  12.5 mg Oral Daily ALucky Rathke FNP   12.5 mg at 05/17/21 0900   chewing gum (ORBIT) sugar free  1 Stick Oral TID PRN NMerrily Brittle DO       cloNIDine (CATAPRES) tablet 0.2 mg  0.2 mg Oral BID ALucky Rathke FNP   0.2 mg at 05/17/21 1721   cloZAPine (CLOZARIL) tablet 75 mg  75 mg Oral QHS NMerrily Brittle DO   75 mg at 05/17/21 2040   donepezil (ARICEPT) tablet 5 mg  5 mg Oral QHS NMerrily Brittle DO   5 mg at 05/17/21 2041   haloperidol (HALDOL) tablet 15 mg  15 mg Oral QHS SViann FishE, MD   15 mg at 05/17/21 2041   haloperidol (HALDOL) tablet 5 mg  5 mg Oral Daily SViann FishE, MD   5 mg at 05/17/21 0900  insulin aspart (novoLOG) injection 0-5 Units  0-5 Units Subcutaneous QHS Harlow Asa, MD   2 Units at 05/17/21 2041   insulin aspart (novoLOG) injection 0-9 Units  0-9 Units Subcutaneous TID WC Harlow Asa, MD   1 Units at 05/17/21 1730   insulin aspart protamine- aspart (NOVOLOG MIX 70/30) injection 35 Units  35 Units Subcutaneous Q supper Merrily Brittle, DO   35 Units at 05/17/21 1730   insulin aspart protamine- aspart (NOVOLOG MIX 70/30) injection 52 Units  52 Units Subcutaneous Q breakfast Merrily Brittle, DO   52 Units at 05/17/21 0900   lisinopril (ZESTRIL) tablet 20 mg  20 mg Oral QHS Nelda Marseille, Dastan Krider E, MD   20 mg at 05/17/21 2041   lithium carbonate (ESKALITH) CR tablet 450 mg  450 mg Oral Q12H Lucky Rathke, FNP   450 mg at 05/17/21 2041   OLANZapine zydis (ZYPREXA) disintegrating tablet 10 mg  10 mg Oral Q8H PRN Corky Sox, MD       And   LORazepam (ATIVAN) tablet 1 mg  1 mg Oral PRN Corky Sox, MD       And   ziprasidone (GEODON) injection 20 mg  20 mg Intramuscular PRN Corky Sox, MD       magnesium hydroxide (MILK OF MAGNESIA) suspension 30 mL  30 mL Oral Daily PRN Lucky Rathke, FNP       senna (SENOKOT) tablet 17.2 mg  2 tablet Oral Daily Merrily Brittle, DO   17.2 mg at 05/17/21 0900    Lab Results:  Results for  orders placed or performed during the hospital encounter of 04/23/21 (from the past 48 hour(s))  Glucose, capillary     Status: Abnormal   Collection Time: 05/16/21 11:54 AM  Result Value Ref Range   Glucose-Capillary 234 (H) 70 - 99 mg/dL    Comment: Glucose reference range applies only to samples taken after fasting for at least 8 hours.  Glucose, capillary     Status: None   Collection Time: 05/16/21  5:12 PM  Result Value Ref Range   Glucose-Capillary 87 70 - 99 mg/dL    Comment: Glucose reference range applies only to samples taken after fasting for at least 8 hours.  Glucose, capillary     Status: Abnormal   Collection Time: 05/16/21  7:36 PM  Result Value Ref Range   Glucose-Capillary 179 (H) 70 - 99 mg/dL    Comment: Glucose reference range applies only to samples taken after fasting for at least 8 hours.  Glucose, capillary     Status: Abnormal   Collection Time: 05/17/21  6:08 AM  Result Value Ref Range   Glucose-Capillary 131 (H) 70 - 99 mg/dL    Comment: Glucose reference range applies only to samples taken after fasting for at least 8 hours.  Glucose, capillary     Status: Abnormal   Collection Time: 05/17/21 12:19 PM  Result Value Ref Range   Glucose-Capillary 197 (H) 70 - 99 mg/dL    Comment: Glucose reference range applies only to samples taken after fasting for at least 8 hours.   Comment 1 Notify RN   Glucose, capillary     Status: Abnormal   Collection Time: 05/17/21  5:30 PM  Result Value Ref Range   Glucose-Capillary 127 (H) 70 - 99 mg/dL    Comment: Glucose reference range applies only to samples taken after fasting for at least 8 hours.  Glucose, capillary     Status: Abnormal  Collection Time: 05/17/21  7:22 PM  Result Value Ref Range   Glucose-Capillary 223 (H) 70 - 99 mg/dL    Comment: Glucose reference range applies only to samples taken after fasting for at least 8 hours.  Glucose, capillary     Status: None   Collection Time: 05/18/21  5:59 AM   Result Value Ref Range   Glucose-Capillary 99 70 - 99 mg/dL    Comment: Glucose reference range applies only to samples taken after fasting for at least 8 hours.    Blood Alcohol level:  Lab Results  Component Value Date   ETH <10 04/16/2021   ETH <10 87/86/7672    Metabolic Disorder Labs: Lab Results  Component Value Date   HGBA1C 9.2 (H) 04/24/2021   MPG 217.34 04/24/2021   MPG 271.87 01/26/2021   No results found for: PROLACTIN Lab Results  Component Value Date   CHOL 140 01/30/2021   TRIG 95 01/30/2021   HDL 49 01/30/2021   CHOLHDL 2.9 01/30/2021   VLDL 19 01/30/2021   LDLCALC 72 01/30/2021   LDLCALC 67 05/20/2020   Psychiatric Specialty Exam: Physical Exam Vitals and nursing note reviewed.  Constitutional:      Appearance: He is not diaphoretic.  HENT:     Head: Normocephalic.  Pulmonary:     Effort: Pulmonary effort is normal. No respiratory distress.  Neurological:     General: No focal deficit present.     Mental Status: He is alert.  Psychiatric:        Behavior: Behavior is cooperative.     Review of Systems  Respiratory:  Negative for shortness of breath.   Cardiovascular:  Negative for chest pain.  Gastrointestinal:  Negative for nausea and vomiting.  Neurological:  Negative for dizziness and headaches.    Body mass index is 37.42 kg/m. Temp:  [97.5 F (36.4 C)] 97.5 F (36.4 C) (02/20 0628) Pulse Rate:  [73-79] 78 (02/20 0628) BP: (119-136)/(74-80) 128/76 (02/20 0628) SpO2:  [100 %] 100 % (02/20 0628)  General Appearance:  Disheveled appearing today wearing dirty top with uncombed hair, no longer malodorous    Eye Contact:   Minimal    Speech:   Rambling and mumbling quality, answers abruptly with short responses to direct questions - nonspontaneous    Volume:   Normal    Mood:   Described as "good" - appears aloof and ambivalent    Affect:   Constricted     Thought Process:  Concrete but linear with overall poverty of  thought  Duration of Psychotic Symptoms:  Greater than six months  Past Diagnosis of Schizophrenia or Psychoactive disorder:  Yes   Orientation:   Oriented to month, year and city and knows he is in a psychiatric hospital but not oriented to person or situation   Thought Content:   Has residual fixed delusion that he is "[name]"; he denies AVH, paranoia, ideas of reference or first rank symptoms.  He is not grossly responding to internal or external stimuli on exam.  He denies SI or HI.   Hallucinations: Denied  Ideas of Reference: None   Suicidal Thoughts:   No   Homicidal Thoughts:   No    Memory:   Immediate - fair; recent and remote poor    Judgement:   Poor    Insight:   Poor    Psychomotor Activity:   no tremor noted, no akathisias, steady gait    Concentration:   Fair  Attention Span:   Fair   Recall:   Poor    Fund of Knowledge:   Limited    Language:   Fair    Assets:   Leisure Time    Sleep:   7.5 hours  AIMS: Facial and Oral Movements Muscles of Facial Expression: None, normal Lips and Perioral Area: None, normal Jaw: None, normal Tongue: None, normal,Extremity Movements Upper (arms, wrists, hands, fingers): None, normal Lower (legs, knees, ankles, toes): None, normal, Trunk Movements Neck, shoulders, hips: None, normal, Overall Severity Severity of abnormal movements (highest score from questions above): None, normal Incapacitation due to abnormal movements: None, normal Patient's awareness of abnormal movements (rate only patient's report): No Awareness, Dental Status Current problems with teeth and/or dentures?: No Does patient usually wear dentures?: No   AIMS 0 (2/13 and on 2/16) MOCA: 13/30  MMSE: 14/30 Pillbox test: Failed    ASSESSMENT: Diagnoses / Active Problems: Schizophrenia by hx Type II DM HTN Hyponatremia Elevated Creatinine High risk medication use Elevated transaminases Anemia Cognitive impairment  (r/o major neurocognitive d/o)  PLAN: Safety and Monitoring:  -- Voluntary admission to inpatient psychiatric unit for safety, stabilization and treatment  -- Daily contact with patient to assess and evaluate symptoms and progress in treatment  -- Patient's case to be discussed in multi-disciplinary team meeting  -- Observation Level : q15 minute checks  -- Vital signs:  q12 hours  -- Precautions: suicide, elopement, and assault  2. Psychiatric Diagnoses and Treatment:   Schizophrenia by hx -Continued Haldol 5 mg qAM & 15 mg qPM for psychosis -Continued Clozaril 75 mg nightly for psychosis - WBC 6.2 , ANC 3300, Troponin 2, CRP 2.1 (2/17) - repeat labs due 2/24 - Clozaril level 187, NorClozapine 87, and total Cloz + Nor-cloz level 273 (2/5) - QTC 478m on (2/7) - QTC 4234m(2/18) - A1c 9.2 (1/27) and lipids WNL in 11/22 - Continue senokot 2 tabs daily for Clozaril-induced constipation  -Continue Lithium 450 mg BID for mood stability - Li level 0.89 (2/17) - Cr (!) 1.27 (2/17)- trending while on Lithium and po hydration encouraged  - TSH 2.233 (1/31) - ACTT referral made and patient now willing to consider ALF with FL2 signed  Cognitive impairment (r/o major neurocognitive d/o) MOCA: 13/30  MMSE: 14/30 Pillbox test: Failed (per OT patient is not able to manage his own medication. Patient will need 24/7 caregiver support to assist with IADLs and medication management to be successful)  - Ongoing concern for ability to live independently and to complete IADLs and manage his insulin regimen and medications- He shows no insight or understanding into his complex medication regimen, the risks associated with not being medication compliant, nor his medical or psychiatric diagnoses.. APS report accepted and guardianship recommended by medical providers - Follow-up on patient guardianship recommendations - Patient now receptive to ALF and FL2 completed - Continued Aricept 5 mg qHS for poor  memory    3. Medical Issues Being Addressed:   T2DM with insulin dependence - A1C of 9.2. Diabetic coordinator on-board for insulin management -Continued insulin per diabetic coordinator with 70/30 52u with breakfast and continue 70/30 35u with supper  - Changed SSI to sensitive coverage per recommendations of diabetic coordinator - Continued HS coverage -Placed on unit restriction for meals to monitor portions and carb choices   HTN -Lisinopril 2033maily (reduced due to concern for creatinine) -Amlodipine 10 mg daily -Clonidine 0.2 mg BID -Atenolol 12.5mg27mily  Elevated transaminases, down trending - AST  76 and ALT 94 (2/17); Alk phos 161 (2/17) - Med consulted, stated that no further work up necessary - Hepatitis panel nonreactive, ammonia 35, negative RUQ u/s 02/10/21 - Will need outpatient f/u after discharge  Mild Hyponatremia - Na+ 132  on 2/17 - continue to trend  Elevated Creatinine - 1.27 (2/17) - encouraging po hydration and continue to trend  Anemia  - Repeat H/H up to 12.1/36.3 - Ferritin 80, Iron/TIBC WNL, Folate 10.5, B12 366 - F/u with PCP after discharge  4. Discharge Planning:   -- Social work and case management to assist with discharge planning and identification of hospital follow-up needs prior to discharge  -- Discharge Concerns: Need to establish safe housing option given his need for assistance with ADLs and medications - need for guardianship and potential ALF/group home referral; ACTT referral  -- Discharge Goals: Return home with outpatient referrals for mental health follow-up including medication management/psychotherapy   Lauriana Denes Artis Delay, MD, [facility]

## 2021-05-18 NOTE — BHH Counselor (Signed)
CSW faxed this patients FL-2 to [place] with [facility] for possible group home placement.     Ruthann Cancer MSW, LCSW Clincal Social Worker  [facility]

## 2021-05-18 NOTE — Progress Notes (Signed)
D:  Mitchell [name] was up and visible on the hall.  Minimal interaction with staff and peers.  He is pleasant and cooperative.  He denied SI/HI or AVH.  He denied any physical complaints and appears to be in no physical distress.  His hs blood sugar was 313 and he required 4 units coverage.  Again discussed with him about carb modified diet.   A:  1:1 with RN for support and encouragement.  Medications given as ordered.  Q 15 minute checks maintained for safety.  Encouraged participation in group and unit activities.   R:  He is currently resting with his eyes closed and appears to be asleep.  He remains safe on the unit.  We will continue to monitor the progress towards his goals.  We will continue to educate him about carb modified diet.

## 2021-05-18 NOTE — [place] FL2 (Addendum)
[place] MEDICAID FL2 LEVEL OF CARE SCREENING TOOL     IDENTIFICATION  Patient Name: Mitchell [name] Birthdate: 18-Mar-1961 Sex: male Admission Date (Current Location): 04/23/2021  [place] and [place] Number:  Mitchell [name] [id number] Facility and Address:  [name], [address] [place], [place] [address]      Provider Number: [id number]  Attending Physician Name and Address:  Roselle Locus, MD  Relative Name and Phone Number:  Mitchell [name] [phone]    Current Level of Care: Hospital Recommended Level of Care: Assisted Living Facility, Family Care Home Prior Approval Number:    Date Approved/Denied:   PASRR Number:    Discharge Plan: Other (Comment) (ALF/FCH)    Current Diagnoses: Patient Active Problem List   Diagnosis Date Noted   Schizophrenia (HCC) 04/29/2021   Hypertension    Type 2 diabetes mellitus (HCC)    Sleep apnea    Hyponatremia 03/13/2011    Orientation RESPIRATION BLADDER Height & Weight     Self, Time, Situation, Place  Normal Continent Weight: 98.9 kg Height:  5\' 4"  (162.6 cm)  BEHAVIORAL SYMPTOMS/MOOD NEUROLOGICAL BOWEL NUTRITION STATUS      Continent Diet (Carb modified/Diabetic)  AMBULATORY STATUS COMMUNICATION OF NEEDS Skin   Independent Verbally Normal                       Personal Care Assistance Level of Assistance  Bathing, Feeding, Dressing, Total care Bathing Assistance: Independent Feeding assistance: Independent Dressing Assistance: Independent Total Care Assistance: Independent   Functional Limitations Info  Sight, Hearing, Speech Sight Info: Adequate Hearing Info: Adequate Speech Info: Adequate    SPECIAL CARE FACTORS FREQUENCY                       Contractures Contractures Info: Not present    Additional Factors Info  Insulin Sliding Scale, Code Status, Allergies Code Status Info: FULL Allergies Info: None   Insulin Sliding Scale Info: See  attached MAR       Current Medications (05/18/2021):  This is the current hospital active medication list Current Facility-Administered Medications  Medication Dose Route Frequency Provider Last Rate Last Admin   acetaminophen (TYLENOL) tablet 650 mg  650 mg Oral Q6H PRN 05/20/2021, FNP       alum & mag hydroxide-simeth (MAALOX/MYLANTA) 200-200-20 MG/5ML suspension 30 mL  30 mL Oral Q4H PRN 10-05-2000, FNP       amLODipine (NORVASC) tablet 10 mg  10 mg Oral QHS Lenard Lance, DO   10 mg at 05/17/21 2041   aspirin EC tablet 81 mg  81 mg Oral Daily 2042, FNP   81 mg at 05/18/21 0758   atenolol (TENORMIN) tablet 12.5 mg  12.5 mg Oral Daily 05/20/21, FNP   12.5 mg at 05/18/21 0758   chewing gum (ORBIT) sugar free  1 Stick Oral TID PRN 05/20/21, DO       cloNIDine (CATAPRES) tablet 0.2 mg  0.2 mg Oral BID Princess Bruins, FNP   0.2 mg at 05/18/21 0758   cloZAPine (CLOZARIL) tablet 75 mg  75 mg Oral QHS 05/20/21, DO   75 mg at 05/17/21 2040   donepezil (ARICEPT) tablet 5 mg  5 mg Oral QHS 05/19/21, DO   5 mg at 05/17/21 2041   haloperidol (HALDOL) tablet 15 mg  15 mg Oral QHS 2042, MD  15 mg at 05/17/21 2041   haloperidol (HALDOL) tablet 5 mg  5 mg Oral Daily Bartholomew Crews E, MD   5 mg at 05/18/21 0757   insulin aspart (novoLOG) injection 0-5 Units  0-5 Units Subcutaneous QHS Comer Locket, MD   2 Units at 05/17/21 2041   insulin aspart (novoLOG) injection 0-9 Units  0-9 Units Subcutaneous TID WC Comer Locket, MD   1 Units at 05/17/21 1730   insulin aspart protamine- aspart (NOVOLOG MIX 70/30) injection 35 Units  35 Units Subcutaneous Q supper Princess Bruins, DO   35 Units at 05/17/21 1730   insulin aspart protamine- aspart (NOVOLOG MIX 70/30) injection 52 Units  52 Units Subcutaneous Q breakfast Princess Bruins, DO   52 Units at 05/18/21 0759   lisinopril (ZESTRIL) tablet 20 mg  20 mg Oral QHS Mason Jim, Amy E, MD   20 mg at 05/17/21 2041   lithium  carbonate (ESKALITH) CR tablet 450 mg  450 mg Oral Q12H Lenard Lance, FNP   450 mg at 05/18/21 0759   OLANZapine zydis (ZYPREXA) disintegrating tablet 10 mg  10 mg Oral Q8H PRN Carlyn Reichert, MD       And   LORazepam (ATIVAN) tablet 1 mg  1 mg Oral PRN Carlyn Reichert, MD       And   ziprasidone (GEODON) injection 20 mg  20 mg Intramuscular PRN Carlyn Reichert, MD       magnesium hydroxide (MILK OF MAGNESIA) suspension 30 mL  30 mL Oral Daily PRN Lenard Lance, FNP       senna (SENOKOT) tablet 17.2 mg  2 tablet Oral Daily Princess Bruins, DO   17.2 mg at 05/18/21 6979     Discharge Medications: Please see discharge summary for a list of discharge medications.  Relevant Imaging Results:  Relevant Lab Results:   Additional Information S.S. #[id number]  Mitchell [place], LCSW

## 2021-05-18 NOTE — Group Note (Signed)
Recreation Therapy Group Note   Group Topic:Health and Wellness  Group Date: 05/18/2021 Start Time: 1000 End Time: 1050 Facilitators: Caroll Rancher, LRT,CTRS Location: [address] Dayroom   Goal Area(s) Addresses:  Patient will verbalize benefit of exercise during group session. Patient will verbalize an exercise that can be completed in their hospital room during admission. Patient will identify an exercise that can be completed post d/c.   Group Description:  LRT and patients discussed the benefits of being active and moving around.  LRT and patients went through a series of stretches to loosen the muscles.  Patients then took turns leading the group in and exercise or dance move of their choosing.  Patients were encouraged to drink water if necessary and take breaks as needed.   Affect/Mood: Appropriate and Drowsy   Participation Level: Minimal   Participation Quality: Independent   Behavior: Appropriate and Drowsy   Speech/Thought Process: Delusional   Insight: Impaired   Judgement: Impaired   Modes of Intervention: Music and Exercise   Patient Response to Interventions:  Receptive   Education Outcome:  Acknowledges education and In group clarification offered    Clinical Observations/Individualized Feedback: Pt came to group late.  Pt completed about two of the exercises before sitting down and observing.  Pt eventually went to sleep for remainder of group.    Plan: Continue to engage patient in RT group sessions 2-3x/week.   Caroll Rancher, Antonietta Jewel 05/18/2021 12:59 PM

## 2021-05-18 NOTE — Group Note (Signed)
[facility] LCSW Group Therapy 05/18/2021  1:00 PM Type of Therapy: Group Therapy  Participation Level: Active  Participation Quality: Attentive, Sharing and Supportive   Modes of Intervention: Clarification, Confrontation, Discussion, Education, Exploration, Limit-setting, Orientation, Problem-solving, Rapport Building, Dance movement psychotherapist, Socialization and Support  Summary of Progress/Problems: The topic for group today was emotional regulation. This group focused on both positive and negative emotion identification and allowed group members to process ways to identify feelings, regulate negative emotions, and find healthy ways to manage internal/external emotions. Group members were asked to reflect on a time when their reaction to an emotion led to a negative outcome and explored how alternative responses using emotion regulation would have benefited them. Group members were also asked to discuss a time when emotion regulation was utilized when a negative emotion was experienced.  Patient Summary: Patient participated in group.  He identified sometimes being angry but most times being content.  Patient focused on his kids and discussed using his faith to help him put emotions in perspective.  Patient had to be redirected during group to stay on topic.   Anson [place], MSW, LCSW Clinical Social Worker [facility]

## 2021-05-18 NOTE — BH IP Treatment Plan (Signed)
Interdisciplinary Treatment and Diagnostic Plan Update  05/18/2021 Time of Session: 9:35am Mitchell [name] MRN: [id number]  Principal Diagnosis: Schizophrenia [facility])  Secondary Diagnoses: Principal Problem:   Schizophrenia (HCC) Active Problems:   Hypertension   Type 2 diabetes mellitus (HCC)   Current Medications:  Current Facility-Administered Medications  Medication Dose Route Frequency Provider Last Rate Last Admin   acetaminophen (TYLENOL) tablet 650 mg  650 mg Oral Q6H PRN Lenard Lance, FNP       alum & mag hydroxide-simeth (MAALOX/MYLANTA) 200-200-20 MG/5ML suspension 30 mL  30 mL Oral Q4H PRN Lenard Lance, FNP       amLODipine (NORVASC) tablet 10 mg  10 mg Oral QHS Princess Bruins, DO   10 mg at 05/17/21 2041   aspirin EC tablet 81 mg  81 mg Oral Daily Lenard Lance, FNP   81 mg at 05/18/21 0758   atenolol (TENORMIN) tablet 12.5 mg  12.5 mg Oral Daily Lenard Lance, FNP   12.5 mg at 05/18/21 0758   chewing gum (ORBIT) sugar free  1 Stick Oral TID PRN Princess Bruins, DO       cloNIDine (CATAPRES) tablet 0.2 mg  0.2 mg Oral BID Lenard Lance, FNP   0.2 mg at 05/18/21 0758   cloZAPine (CLOZARIL) tablet 75 mg  75 mg Oral QHS Princess Bruins, DO   75 mg at 05/17/21 2040   donepezil (ARICEPT) tablet 5 mg  5 mg Oral QHS Princess Bruins, DO   5 mg at 05/17/21 2041   haloperidol (HALDOL) tablet 15 mg  15 mg Oral QHS Bartholomew Crews E, MD   15 mg at 05/17/21 2041   haloperidol (HALDOL) tablet 5 mg  5 mg Oral Daily Bartholomew Crews E, MD   5 mg at 05/18/21 0757   insulin aspart (novoLOG) injection 0-5 Units  0-5 Units Subcutaneous QHS Comer Locket, MD   2 Units at 05/17/21 2041   insulin aspart (novoLOG) injection 0-9 Units  0-9 Units Subcutaneous TID WC Comer Locket, MD   1 Units at 05/17/21 1730   insulin aspart protamine- aspart (NOVOLOG MIX 70/30) injection 35 Units  35 Units Subcutaneous Q supper Princess Bruins, DO   35 Units at 05/17/21 1730   insulin aspart protamine- aspart (NOVOLOG  MIX 70/30) injection 52 Units  52 Units Subcutaneous Q breakfast Princess Bruins, DO   52 Units at 05/18/21 0759   lisinopril (ZESTRIL) tablet 20 mg  20 mg Oral QHS Mason Jim, Amy E, MD   20 mg at 05/17/21 2041   lithium carbonate (ESKALITH) CR tablet 450 mg  450 mg Oral Q12H Lenard Lance, FNP   450 mg at 05/18/21 0759   OLANZapine zydis (ZYPREXA) disintegrating tablet 10 mg  10 mg Oral Q8H PRN Carlyn Reichert, MD       And   LORazepam (ATIVAN) tablet 1 mg  1 mg Oral PRN Carlyn Reichert, MD       And   ziprasidone (GEODON) injection 20 mg  20 mg Intramuscular PRN Carlyn Reichert, MD       magnesium hydroxide (MILK OF MAGNESIA) suspension 30 mL  30 mL Oral Daily PRN Lenard Lance, FNP       senna (SENOKOT) tablet 17.2 mg  2 tablet Oral Daily Princess Bruins, DO   17.2 mg at 05/18/21 9494   PTA Medications: Medications Prior to Admission  Medication Sig Dispense Refill Last Dose   amLODipine (NORVASC) 10 MG tablet Take 1 tablet (  10 mg total) by mouth daily. (Patient not taking: Reported on 04/16/2021) 90 tablet 1    aspirin 81 MG EC tablet Take 1 tablet (81 mg total) by mouth daily. Swallow whole. (Patient not taking: Reported on 04/16/2021) 30 tablet 12    atenolol (TENORMIN) 25 MG tablet Take 0.5 tablets (12.5 mg total) by mouth daily. (Patient not taking: Reported on 04/16/2021) 15 tablet 0    benztropine (COGENTIN) 1 MG tablet Take 1 tablet (1 mg total) by mouth 2 (two) times daily. 60 tablet 2    cloNIDine (CATAPRES) 0.2 MG tablet Take 0.2 mg by mouth 2 (two) times daily. (Patient not taking: Reported on 01/26/2021)      cloZAPine (CLOZARIL) 50 MG tablet Take 1 tablet (50 mg total) by mouth 2 (two) times daily. 60 tablet 0    glipiZIDE (GLUCOTROL XL) 10 MG 24 hr tablet Take 2 tablets (20 mg total) by mouth daily with breakfast. 60 tablet 0    haloperidol (HALDOL) 20 MG tablet Take 1 tablet (20 mg total) by mouth at bedtime. (Patient not taking: Reported on 01/26/2021) 90 tablet 1    insulin aspart  (NOVOLOG) 100 UNIT/ML injection Inject 0-9 Units into the skin 3 (three) times daily with meals. 10 mL 11    insulin aspart protamine - aspart (NOVOLOG MIX 70/30 FLEXPEN) (70-30) 100 UNIT/ML FlexPen Inject 48 Units into the skin daily with breakfast. 15 mL 0    insulin aspart protamine- aspart (NOVOLOG MIX 70/30) (70-30) 100 UNIT/ML injection Inject 0.42 mLs (42 Units total) into the skin daily with supper. 10 mL 11    lisinopril (ZESTRIL) 40 MG tablet Take 1 tablet (40 mg total) by mouth daily. (Patient not taking: Reported on 01/11/2020) 90 tablet 1    lithium carbonate (ESKALITH) 450 MG CR tablet Take 1 tablet (450 mg total) by mouth every 12 (twelve) hours. (Patient not taking: Reported on 01/26/2021) 60 tablet 2    traZODone (DESYREL) 50 MG tablet Take 1 tablet (50 mg total) by mouth at bedtime as needed for sleep.       Patient Stressors: Marital or family conflict   Medication change or noncompliance    Patient Strengths: Automotive engineer for treatment/growth   Treatment Modalities: Medication Management, Group therapy, Case management,  1 to 1 session with clinician, Psychoeducation, Recreational therapy.   Physician Treatment Plan for Primary Diagnosis: Schizophrenia (HCC) Long Term Goal(s):     Short Term Goals:    Medication Management: Evaluate patient's response, side effects, and tolerance of medication regimen.  Therapeutic Interventions: 1 to 1 sessions, Unit Group sessions and Medication administration.  Evaluation of Outcomes: Progressing  Physician Treatment Plan for Secondary Diagnosis: Principal Problem:   Schizophrenia (HCC) Active Problems:   Hypertension   Type 2 diabetes mellitus (HCC)  Long Term Goal(s):     Short Term Goals:       Medication Management: Evaluate patient's response, side effects, and tolerance of medication regimen.  Therapeutic Interventions: 1 to 1 sessions, Unit Group sessions and Medication  administration.  Evaluation of Outcomes: Progressing   RN Treatment Plan for Primary Diagnosis: Schizophrenia (HCC) Long Term Goal(s): Knowledge of disease and therapeutic regimen to maintain health will improve  Short Term Goals: Ability to demonstrate self-control, Ability to participate in decision making will improve, Ability to identify and develop effective coping behaviors will improve, and Compliance with prescribed medications will improve  Medication Management: RN will administer medications as ordered by provider, will assess  and evaluate patient's response and provide education to patient for prescribed medication. RN will report any adverse and/or side effects to prescribing provider.  Therapeutic Interventions: 1 on 1 counseling sessions, Psychoeducation, Medication administration, Evaluate responses to treatment, Monitor vital signs and CBGs as ordered, Perform/monitor CIWA, COWS, AIMS and Fall Risk screenings as ordered, Perform wound care treatments as ordered.  Evaluation of Outcomes: Progressing   LCSW Treatment Plan for Primary Diagnosis: Schizophrenia (HCC) Long Term Goal(s): Safe transition to appropriate next level of care at discharge, Engage patient in therapeutic group addressing interpersonal concerns.  Short Term Goals: Engage patient in aftercare planning with referrals and resources, Increase social support, Increase ability to appropriately verbalize feelings, Identify triggers associated with mental health/substance abuse issues, and Increase skills for wellness and recovery  Therapeutic Interventions: Assess for all discharge needs, 1 to 1 time with Social worker, Explore available resources and support systems, Assess for adequacy in community support network, Educate family and significant other(s) on suicide prevention, Complete Psychosocial Assessment, Interpersonal group therapy.  Evaluation of Outcomes: Progressing   Progress in Treatment: Attending  groups: Yes. Participating in groups: No. Taking medication as prescribed: Yes. Toleration medication: Yes. Family/Significant other contact made: Yes, individual(s) contacted:  attempted to contact Karsten Ro. Number is not active Patient understands diagnosis: No. Discussing patient identified problems/goals with staff: Yes. Medical problems stabilized or resolved: Yes. Denies suicidal/homicidal ideation: Yes. Issues/concerns per patient self-inventory: No. Other: None   New problem(s) identified: No, Describe:  None   New Short Term/Long Term Goal(s):medication stabilization, elimination of SI thoughts, development of comprehensive mental wellness plan.    Patient Goals:  "to sit down and mind by business and think about my triggers and songs."   Discharge Plan or Barriers: Pt will f/u with [facility] for services. APS has been contacted to see if pt is in need of guardian. Assisted living facility is to be sought for this patient.    Reason for Continuation of Hospitalization: Medication stabilization   Estimated Length of Stay: 3-5 days   Scribe for Treatment Team: Otelia Santee, LCSW 05/18/2021 10:30 AM

## 2021-05-19 DIAGNOSIS — F250 Schizoaffective disorder, bipolar type: Secondary | ICD-10-CM | POA: Diagnosis not present

## 2021-05-19 LAB — GLUCOSE, CAPILLARY
Glucose-Capillary: 96 mg/dL (ref 70–99)
Glucose-Capillary: 195 mg/dL — ABNORMAL HIGH (ref 70–99)
Glucose-Capillary: 149 mg/dL — ABNORMAL HIGH (ref 70–99)
Glucose-Capillary: 171 mg/dL — ABNORMAL HIGH (ref 70–99)

## 2021-05-19 NOTE — Progress Notes (Signed)
°   05/19/21 2040  Psych Admission Type (Psych Patients Only)  Admission Status Voluntary  Psychosocial Assessment  Patient Complaints None  Eye Contact Fair  Facial Expression Animated  Affect Appropriate to circumstance  Speech Logical/coherent  Interaction Assertive  Motor Activity Slow  Appearance/Hygiene Unremarkable  Behavior Characteristics Cooperative;Appropriate to situation  Mood Pleasant  Thought Process  Coherency WDL  Content Delusions  Delusions Grandeur  Perception WDL  Hallucination None reported or observed  Judgment Limited  Confusion None  Danger to Self  Current suicidal ideation? Denies  Danger to Others  Danger to Others None reported or observed

## 2021-05-19 NOTE — [facility] Group Notes (Signed)
Adult Psychoeducational Group Note  Date:  05/19/2021 Time:  2:29 PM  Group Topic/Focus:  Wellness Toolbox:   The focus of this group is to discuss various aspects of wellness, balancing those aspects and exploring ways to increase the ability to experience wellness.  Patients will create a wellness toolbox for use upon discharge.  Participation Level:  Active  Participation Quality:  Appropriate  Affect:  Appropriate  Cognitive:  Appropriate  Insight: Good  Engagement in Group:  Engaged  Modes of Intervention:  Activity  Additional Comments:  Patient attended and participated in the relaxation group activity.  Annie Sable 05/19/2021, 2:29 PM

## 2021-05-19 NOTE — [facility] Counselor (Signed)
CSW spoke to [place] at [place] who states he will come to interview pt at 12:30 tomorrow.      Fredirick Lathe, LCSWA Clinicial Social Worker [employer]

## 2021-05-19 NOTE — Progress Notes (Signed)
°   05/19/21 0910  Psych Admission Type (Psych Patients Only)  Admission Status Voluntary  Psychosocial Assessment  Patient Complaints None  Eye Contact Fair  Facial Expression Animated  Affect Appropriate to circumstance  Speech Logical/coherent  Interaction Assertive  Motor Activity Slow  Appearance/Hygiene Unremarkable  Behavior Characteristics Cooperative;Appropriate to situation;Calm  Mood Euthymic;Pleasant  Thought Process  Coherency WDL  Content Delusions  Delusions Grandeur  Perception WDL  Hallucination None reported or observed  Judgment Limited  Confusion None  Danger to Self  Current suicidal ideation? Denies  Danger to Others  Danger to Others None reported or observed

## 2021-05-19 NOTE — Progress Notes (Signed)
Adult Psychoeducational Group Note  Date:  05/19/2021 Time:  11:42 PM  Group Topic/Focus:  Wrap-Up Group:   The focus of this group is to help patients review their daily goal of treatment and discuss progress on daily workbooks.  Participation Level:  Active  Participation Quality:  Appropriate  Affect:  Appropriate  Cognitive:  Appropriate  Insight: Appropriate  Engagement in Group:  Engaged  Modes of Intervention:  Discussion  Additional Comments:  Pt stated his goal for today was to focus on his treatment plan. Pt stated he accomplished his goal today. Pt stated he talked with his doctor and with his social worker about his care today. Pt rated his overall day a 10.  Pt stated he made no calls today. Pt stated he felt better about himself today. Pt stated staff brought back all meals for him today because of his blood sugar issues. Pt stated he took all medications provided today. Pt stated his appetite was pretty good today. Pt rated sleep last night was pretty good. Pt stated the goal tonight was to get some rest. Pt stated he had no physical pain tonight. Pt deny visual hallucinations and auditory issues tonight. Pt denies thoughts of harming himself or others. Pt stated he would alert staff if anything changed   Mitchell [name] 05/19/2021, 11:42 PM

## 2021-05-19 NOTE — Plan of Care (Signed)
°  Problem: Education: Goal: Mental status will improve Outcome: Progressing   Problem: Activity: Goal: Interest or engagement in activities will improve Outcome: Not Progressing   Problem: Coping: Goal: Ability to verbalize frustrations and anger appropriately will improve Outcome: Progressing   Problem: Coping: Goal: Ability to demonstrate self-control will improve Outcome: Progressing   Problem: Safety: Goal: Periods of time without injury will increase Outcome: Progressing

## 2021-05-19 NOTE — Progress Notes (Signed)
[facility] MD Progress Note   05/19/2021 6:03 PM AHMANI PREHN  MRN:  [id number]  Chief Complaint: delusions and SI  Reason for Admission: SAIF PETER is a 61 y.o. male with PPHx of Schizophrenia and multiple hospitalizations, who presented to [facility] via IVC for delusion that he is [name] with threats to jump into a trash compactor. He was then admitted Involuntary to [facility] for treatment of schizophrenia exacerbated by medication selective adherence.  Chart Review from last 24 hours:  The patient's chart was reviewed and nursing notes were reviewed. The patient's case was discussed in multidisciplinary team meeting. Per nursing, patient attended select groups.  He had no acute behavioral issues or safety concerns noted and nursing did not see him responding to internal stimuli on exam yesterday.  Information Obtained Today During Patient Interview: The patient was seen and evaluated on the unit.  He denies SI, HI, AVH, ideas of reference, paranoia, or first rank symptoms.  He continues to have delusion that he is [name]. He went down for the outside group today and watched the younger peers play basketball. He feels that he is doing overall "fine" and has no complaints with his medications. He has no complaints with meals or facility. No new physical complaints today. He doesn't have any plans for the rest of the evening and overall he has had a "good" day.    Principal Problem: Schizophrenia ([place]) Diagnosis: Principal Problem:   Schizophrenia ([place]) Active Problems:   Hypertension   Type 2 diabetes mellitus ([place])   Past Psychiatric History: see H&P  Past Medical History:  Past Medical History:  Diagnosis Date   Bipolar affective disorder ([place])    takes Zyprexa daily   Coarse tremors 10/02/2014   Diabetes mellitus    takes Victoza,Metformin,and Glipizide daily   Hypertension    takes Amlodipine,Lisinopril and Clonidine daily   Hyponatremia     history of   Lithium toxicity 10/02/2014   Mental disorder    takes Lithium daily   Schizoaffective disorder    takes Trazodone nightly   Schizoaffective disorder ([place]) 01/04/2019   Seasonal allergies    takes Claritin daily   Sleep apnea    sleep study >23yr ago   Stroke ([facility]    left arm weakness    Past Surgical History:  Procedure Laterality Date   CATARACT EXTRACTION W/PHACO Right 02/14/2013   Procedure: CATARACT EXTRACTION PHACO AND INTRAOCULAR LENS PLACEMENT ([place];  Surgeon: GAdonis Brook MD;  Location: [facility]  Service: Ophthalmology;  Laterality: Right;   CATARACT EXTRACTION W/PHACO Left 06/13/2013   Procedure: CATARACT EXTRACTION PHACO AND INTRAOCULAR LENS PLACEMENT (IOC);  Surgeon: GAdonis Brook MD;  Location: [facility]  Service: Ophthalmology;  Laterality: Left;   CIRCUMCISION  20 yrs. ago   EYE SURGERY     Family History:see H&P  Family Psychiatric History: unable to obtain  Social History:  Social History   Substance and Sexual Activity  Alcohol Use Not Currently     Social History   Substance and Sexual Activity  Drug Use No    Social History   Socioeconomic History   Marital status: Divorced    Spouse name: Not on file   Number of children: Not on file   Years of education: Not on file   Highest education level: Not on file  Occupational History   Not on file  Tobacco Use   Smoking status: Never   Smokeless tobacco: Never  Vaping Use   Vaping Use: Never used  Substance and Sexual Activity   Alcohol use: Not Currently   Drug use: No   Sexual activity: Yes    Birth control/protection: None  Other Topics Concern   Not on file  Social History Narrative   Not on file   Social Determinants of Health   Financial Resource Strain: Not on file  Food Insecurity: Not on file  Transportation Needs: Not on file  Physical Activity: Not on file  Stress: Not on file  Social Connections: Not on file   Sleep: Good  Appetite: Good  Current  Medications: Current Facility-Administered Medications  Medication Dose Route Frequency Provider Last Rate Last Admin   acetaminophen (TYLENOL) tablet 650 mg  650 mg Oral Q6H PRN Lucky Rathke, FNP       alum & mag hydroxide-simeth (MAALOX/MYLANTA) 200-200-20 MG/5ML suspension 30 mL  30 mL Oral Q4H PRN Lucky Rathke, FNP       amLODipine (NORVASC) tablet 10 mg  10 mg Oral QHS Merrily Brittle, DO   10 mg at 05/18/21 2115   aspirin EC tablet 81 mg  81 mg Oral Daily Lucky Rathke, FNP   81 mg at 05/19/21 0748   atenolol (TENORMIN) tablet 12.5 mg  12.5 mg Oral Daily Lucky Rathke, FNP   12.5 mg at 05/19/21 0749   chewing gum (ORBIT) sugar free  1 Stick Oral TID PRN Merrily Brittle, DO       cloNIDine (CATAPRES) tablet 0.2 mg  0.2 mg Oral BID Lucky Rathke, FNP   0.2 mg at 05/19/21 1718   cloZAPine (CLOZARIL) tablet 75 mg  75 mg Oral QHS Merrily Brittle, DO   75 mg at 05/18/21 2115   donepezil (ARICEPT) tablet 5 mg  5 mg Oral QHS Merrily Brittle, DO   5 mg at 05/18/21 2115   haloperidol (HALDOL) tablet 15 mg  15 mg Oral QHS Viann Fish E, MD   15 mg at 05/18/21 2115   haloperidol (HALDOL) tablet 5 mg  5 mg Oral Daily Viann Fish E, MD   5 mg at 05/19/21 0748   insulin aspart (novoLOG) injection 0-5 Units  0-5 Units Subcutaneous QHS Harlow Asa, MD   4 Units at 05/18/21 2119   insulin aspart (novoLOG) injection 0-9 Units  0-9 Units Subcutaneous TID WC Harlow Asa, MD   1 Units at 05/19/21 1719   insulin aspart protamine- aspart (NOVOLOG MIX 70/30) injection 35 Units  35 Units Subcutaneous Q supper Merrily Brittle, DO   35 Units at 05/19/21 1719   insulin aspart protamine- aspart (NOVOLOG MIX 70/30) injection 52 Units  52 Units Subcutaneous Q breakfast Merrily Brittle, DO   52 Units at 05/19/21 0752   lisinopril (ZESTRIL) tablet 20 mg  20 mg Oral QHS Nelda Marseille, Amy E, MD   20 mg at 05/18/21 2114   lithium carbonate (ESKALITH) CR tablet 450 mg  450 mg Oral Q12H Lucky Rathke, FNP   450 mg at 05/19/21  0749   OLANZapine zydis (ZYPREXA) disintegrating tablet 10 mg  10 mg Oral Q8H PRN Corky Sox, MD       And   LORazepam (ATIVAN) tablet 1 mg  1 mg Oral PRN Corky Sox, MD       And   ziprasidone (GEODON) injection 20 mg  20 mg Intramuscular PRN Corky Sox, MD       magnesium hydroxide (MILK OF MAGNESIA) suspension 30 mL  30  mL Oral Daily PRN Lucky Rathke, FNP       senna (SENOKOT) tablet 17.2 mg  2 tablet Oral Daily Merrily Brittle, DO   17.2 mg at 05/19/21 8786    Lab Results:  Results for orders placed or performed during the hospital encounter of 04/23/21 (from the past 48 hour(s))  Glucose, capillary     Status: Abnormal   Collection Time: 05/17/21  7:22 PM  Result Value Ref Range   Glucose-Capillary 223 (H) 70 - 99 mg/dL    Comment: Glucose reference range applies only to samples taken after fasting for at least 8 hours.  Glucose, capillary     Status: None   Collection Time: 05/18/21  5:59 AM  Result Value Ref Range   Glucose-Capillary 99 70 - 99 mg/dL    Comment: Glucose reference range applies only to samples taken after fasting for at least 8 hours.  Glucose, capillary     Status: Abnormal   Collection Time: 05/18/21 12:00 PM  Result Value Ref Range   Glucose-Capillary 202 (H) 70 - 99 mg/dL    Comment: Glucose reference range applies only to samples taken after fasting for at least 8 hours.  Glucose, capillary     Status: Abnormal   Collection Time: 05/18/21  4:51 PM  Result Value Ref Range   Glucose-Capillary 159 (H) 70 - 99 mg/dL    Comment: Glucose reference range applies only to samples taken after fasting for at least 8 hours.  Glucose, capillary     Status: Abnormal   Collection Time: 05/18/21  7:33 PM  Result Value Ref Range   Glucose-Capillary 313 (H) 70 - 99 mg/dL    Comment: Glucose reference range applies only to samples taken after fasting for at least 8 hours.  Glucose, capillary     Status: None   Collection Time: 05/19/21  5:29 AM  Result  Value Ref Range   Glucose-Capillary 96 70 - 99 mg/dL    Comment: Glucose reference range applies only to samples taken after fasting for at least 8 hours.  Glucose, capillary     Status: Abnormal   Collection Time: 05/19/21 11:53 AM  Result Value Ref Range   Glucose-Capillary 195 (H) 70 - 99 mg/dL    Comment: Glucose reference range applies only to samples taken after fasting for at least 8 hours.  Glucose, capillary     Status: Abnormal   Collection Time: 05/19/21  5:11 PM  Result Value Ref Range   Glucose-Capillary 149 (H) 70 - 99 mg/dL    Comment: Glucose reference range applies only to samples taken after fasting for at least 8 hours.    Blood Alcohol level:  Lab Results  Component Value Date   ETH <10 04/16/2021   ETH <10 76/72/0947    Metabolic Disorder Labs: Lab Results  Component Value Date   HGBA1C 9.2 (H) 04/24/2021   MPG 217.34 04/24/2021   MPG 271.87 01/26/2021   No results found for: PROLACTIN Lab Results  Component Value Date   CHOL 140 01/30/2021   TRIG 95 01/30/2021   HDL 49 01/30/2021   CHOLHDL 2.9 01/30/2021   VLDL 19 01/30/2021   LDLCALC 72 01/30/2021   LDLCALC 67 05/20/2020   Psychiatric Specialty Exam: Physical Exam Vitals and nursing note reviewed.  Constitutional:      Appearance: He is not diaphoretic.  HENT:     Head: Normocephalic.  Pulmonary:     Effort: Pulmonary effort is normal. No respiratory distress.  Neurological:     General: No focal deficit present.     Mental Status: He is alert.  Psychiatric:        Behavior: Behavior is cooperative.     Review of Systems  Respiratory:  Negative for shortness of breath.   Cardiovascular:  Negative for chest pain.  Gastrointestinal:  Negative for nausea and vomiting.  Neurological:  Negative for dizziness and headaches.    Body mass index is 37.42 kg/m. Temp:  [98.9 F (37.2 C)] 98.9 F (37.2 C) (02/21 0625) Pulse Rate:  [72-89] 72 (02/21 1630) BP: (130-137)/(70-81) 137/81 (02/21  1630) SpO2:  [99 %-100 %] 99 % (02/21 0626)  General Appearance:  Disheveled appearing today wearing dirty top with uncombed hair, no longer malodorous    Eye Contact:   Minimal    Speech:   Rambling and mumbling quality, answers abruptly with short responses to direct questions - nonspontaneous    Volume:   Normal    Mood:   Described as "good" - appears aloof and ambivalent    Affect:   Constricted     Thought Process:  Concrete but linear with overall poverty of thought  Duration of Psychotic Symptoms:  Greater than six months  Past Diagnosis of Schizophrenia or Psychoactive disorder:  Yes   Orientation:   Oriented to month, year and city and knows he is in a psychiatric hospital but not oriented to person or situation   Thought Content:   Has residual fixed delusion that he is "[name]"; he denies AVH, paranoia, ideas of reference or first rank symptoms.  He is not grossly responding to internal or external stimuli on exam.  He denies SI or HI.   Hallucinations: Denied  Ideas of Reference: None   Suicidal Thoughts:   No   Homicidal Thoughts:   No    Memory:   Immediate - fair; recent and remote poor    Judgement:   Poor    Insight:   Poor    Psychomotor Activity:   no tremor noted, no akathisias, steady gait    Concentration:   Fair    Attention Span:   Fair   Recall:   Poor    Fund of Knowledge:   Limited    Language:   Fair    Assets:   Leisure Time    Sleep:   7.5 hours  AIMS: Facial and Oral Movements Muscles of Facial Expression: None, normal Lips and Perioral Area: None, normal Jaw: None, normal Tongue: None, normal,Extremity Movements Upper (arms, wrists, hands, fingers): None, normal Lower (legs, knees, ankles, toes): None, normal, Trunk Movements Neck, shoulders, hips: None, normal, Overall Severity Severity of abnormal movements (highest score from questions above): None, normal Incapacitation due to abnormal  movements: None, normal Patient's awareness of abnormal movements (rate only patient's report): No Awareness, Dental Status Current problems with teeth and/or dentures?: No Does patient usually wear dentures?: No   AIMS 0 (2/13 and on 2/16) MOCA: 13/30  MMSE: 14/30 Pillbox test: Failed    ASSESSMENT: Diagnoses / Active Problems: Schizophrenia by hx Type II DM HTN Hyponatremia Elevated Creatinine High risk medication use Elevated transaminases Anemia Cognitive impairment (r/o major neurocognitive d/o)  PLAN: Safety and Monitoring:  -- Voluntary admission to inpatient psychiatric unit for safety, stabilization and treatment  -- Daily contact with patient to assess and evaluate symptoms and progress in treatment  -- Patient's case to be discussed in multi-disciplinary team meeting  -- Observation Level : q15 minute  checks  -- Vital signs:  q12 hours  -- Precautions: suicide, elopement, and assault  2. Psychiatric Diagnoses and Treatment:   Schizophrenia by hx -Continued Haldol 5 mg qAM & 15 mg qPM for psychosis -Continued Clozaril 75 mg nightly for psychosis - WBC 6.2 , ANC 3300, Troponin 2, CRP 2.1 (2/17) - repeat labs due 2/24 - Clozaril level 187, NorClozapine 87, and total Cloz + Nor-cloz level 273 (2/5) - QTC 472m on (2/7) - QTC 4270m(2/18) - A1c 9.2 (1/27) and lipids WNL in 11/22 - Continue senokot 2 tabs daily for Clozaril-induced constipation  -Continue Lithium 450 mg BID for mood stability - Li level 0.89 (2/17) - Cr (!) 1.27 (2/17)- trending while on Lithium and po hydration encouraged  - TSH 2.233 (1/31) - ACTT referral made and patient now willing to consider ALF with FL2 signed  Cognitive impairment (r/o major neurocognitive d/o) MOCA: 13/30  MMSE: 14/30 Pillbox test: Failed (per OT patient is not able to manage his own medication. Patient will need 24/7 caregiver support to assist with IADLs and medication management to be successful)  - Ongoing concern  for ability to live independently and to complete IADLs and manage his insulin regimen and medications- He shows no insight or understanding into his complex medication regimen, the risks associated with not being medication compliant, nor his medical or psychiatric diagnoses.. APS report accepted and guardianship recommended by medical providers - Follow-up on patient guardianship recommendations - Patient now receptive to ALF and FL2 completed - Continued Aricept 5 mg qHS for poor memory    3. Medical Issues Being Addressed:   T2DM with insulin dependence - A1C of 9.2. Diabetic coordinator on-board for insulin management -Continued insulin per diabetic coordinator with 70/30 52u with breakfast and continue 70/30 35u with supper  - Changed SSI to sensitive coverage per recommendations of diabetic coordinator - Continued HS coverage -Placed on unit restriction for meals to monitor portions and carb choices   HTN -Lisinopril 2045maily (reduced due to concern for creatinine) -Amlodipine 10 mg daily -Clonidine 0.2 mg BID -Atenolol 12.5mg67mily  Elevated transaminases, down trending - AST 76 and ALT 94 (2/17); Alk phos 161 (2/17) - Med consulted, stated that no further work up necessary - Hepatitis panel nonreactive, ammonia 35, negative RUQ u/s 02/10/21 - Will need outpatient f/u after discharge  Mild Hyponatremia - Na+ 132  on 2/17 - continue to trend  Elevated Creatinine - 1.27 (2/17) - encouraging po hydration and continue to trend  Anemia  - Repeat H/H up to 12.1/36.3 - Ferritin 80, Iron/TIBC WNL, Folate 10.5, B12 366 - F/u with PCP after discharge  4. Discharge Planning:   -- Social work and case management to assist with discharge planning and identification of hospital follow-up needs prior to discharge  -- Discharge Concerns: Need to establish safe housing option given his need for assistance with ADLs and medications - need for guardianship and potential ALF/group home  referral; ACTT referral  -- Discharge Goals: Return home with outpatient referrals for mental health follow-up including medication management/psychotherapy   StepMaida Sale

## 2021-05-19 NOTE — Group Note (Signed)
Recreation Therapy Group Note   Group Topic:Communication  Group Date: 05/19/2021 Start Time: 1000 End Time: 1035 Facilitators: Caroll Rancher, LRT,CTRS Location: [address] Dayroom   Goal Area(s) Addresses:  Patient will effectively listen to complete activity.  Patient will identify communication skills used to make activity successful.  Patient will identify how skills used during activity can be used to reach post d/c goals.    Group Description: Geometric Drawings.  Three volunteers from the peer group will be shown an abstract picture with a particular arrangement of geometrical shapes.  Each round, one 'speaker' will describe the pattern, as accurately as possible without revealing the image to the group.  The remaining group members will listen and draw the picture to reflect how it is described to them. Patients with the role of 'listener' cannot ask clarifying questions but, may request that the speaker repeat a direction. Once the drawings are complete, the presenter will show the rest of the group the picture and compare how close each person came to drawing the picture. LRT will facilitate a post-activity discussion regarding effective communication and the importance of planning, listening, and asking for clarification in daily interactions with others.   Affect/Mood: N/A   Participation Level: Did not attend    Clinical Observations/Individualized Feedback:     Plan: Continue to engage patient in RT group sessions 2-3x/week.   Caroll Rancher, Antonietta Jewel  05/19/2021 12:42 PM

## 2021-05-20 DIAGNOSIS — F250 Schizoaffective disorder, bipolar type: Secondary | ICD-10-CM | POA: Diagnosis not present

## 2021-05-20 LAB — GLUCOSE, CAPILLARY
Glucose-Capillary: 114 mg/dL — ABNORMAL HIGH (ref 70–99)
Glucose-Capillary: 157 mg/dL — ABNORMAL HIGH (ref 70–99)
Glucose-Capillary: 88 mg/dL (ref 70–99)
Glucose-Capillary: 240 mg/dL — ABNORMAL HIGH (ref 70–99)
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
## 2021-05-20 NOTE — Group Note (Signed)
Recreation Therapy Group Note   Group Topic:Leisure Education  Group Date: 05/20/2021 Start Time: 1000 End Time: [id number] Facilitators: Victorino Sparrow, LRT,CTRS Location: [address] Dayroom   Goal Area(s) Addresses:  Patient will successfully identify positive leisure and recreation activities.  Patient will acknowledge benefits of participation in healthy leisure activities post discharge.    Group Description:  Therapeutic Movie.  LRT and patients watched a movie about adapting to a different environment without losing your sense of self.  It also emphasized making the most of what you've learned in the new setting while at the same time making a positive impact.  The activity also showed that leisure doesn't always cost money or needs to be something extravagant.  It can be something simple to let you escape and mentally take a break from all of life's stresses.   Affect/Mood: Flat   Participation Level: Minimal   Participation Quality: Independent   Behavior: Appropriate and Drowsy   Speech/Thought Process: Delusional   Insight: Impaired   Judgement: Impaired   Modes of Intervention: Movie   Patient Response to Interventions:  Receptive   Education Outcome:  Acknowledges education and In group clarification offered    Clinical Observations/Individualized Feedback: Pt came into group late.  Pt was quiet.  Pt sat for a few moments before falling asleep.    Plan: Continue to engage patient in RT group sessions 2-3x/week.   Victorino Sparrow, LRT,CTRS 05/20/2021 2:02 PM
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
## 2021-05-20 NOTE — Progress Notes (Signed)
The patient attended approximately half of the group and remained silent.

## 2021-05-20 NOTE — Group Note (Signed)
[facility] LCSW Group Therapy Note  Date/Time: 05/20/2021  Type of Therapy and Topic:  Group Therapy:  Strengths and Qualities  Participation Level:  Minimal  Description of Group:    In this group patients will be asked to explore and define the terms strength ans qualities.  Patients will be guided to discuss their thoughts, feelings, and behaviors as to where strengths and qualities originate. Participants will then list some of their strengths and qualities related to each subject topic. This group will be process-oriented, with patients participating in exploration of their own experiences as well as giving and receiving support and challenge from other group members.  Therapeutic Goals: Patient will identify specific strengths related to their personal life. Patient will identify feelings, thoughts, and beliefs about strengths and qualities. Patient will identify ways their strengths have been used. . Patient will identify situations where they have helped others or made someone else happy. .  Summary of Patient Progress Patient participated in group on today. Patient was appropriate, attentive, and sharing in her experiences. Patient was able to discuss some of her feelings and behaviors related to the obstacles she has faced. Patient discussed that being in the hospital and sharing expreriences with other patients has been inspiring.    Therapeutic Modalities:   Cognitive Behavioral Therapy Solution Focused Therapy Motivational Interviewing Brief Therapy   Devun Anna, LCSW, [place] Social Worker  [facility]

## 2021-05-20 NOTE — Plan of Care (Signed)
°  Problem: Activity: Goal: Interest or engagement in activities will improve Outcome: Progressing   Problem: Coping: Goal: Ability to verbalize frustrations and anger appropriately will improve Outcome: Progressing   Problem: Coping: Goal: Ability to demonstrate self-control will improve Outcome: Progressing   Problem: Safety: Goal: Periods of time without injury will increase Outcome: Progressing   Problem: Coping: Goal: Coping ability will improve Outcome: Progressing   Problem: Health Behavior/Discharge Planning: Goal: Compliance with prescribed medication regimen will improve Outcome: Progressing   Problem: Role Relationship: Goal: Ability to interact with others will improve Outcome: Progressing

## 2021-05-20 NOTE — Progress Notes (Signed)
°   05/20/21 0945  Psych Admission Type (Psych Patients Only)  Admission Status Voluntary  Psychosocial Assessment  Patient Complaints None  Eye Contact Fair  Facial Expression Animated  Affect Appropriate to circumstance  Speech Logical/coherent  Interaction Assertive  Motor Activity Other (Comment) (WNL)  Appearance/Hygiene Unremarkable  Behavior Characteristics Cooperative;Appropriate to situation  Mood Pleasant  Thought Process  Coherency WDL  Content Delusions  Delusions Grandeur  Perception WDL  Hallucination None reported or observed  Judgment Limited  Confusion None  Danger to Self  Current suicidal ideation? Denies  Danger to Others  Danger to Others None reported or observed

## 2021-05-20 NOTE — BHH Group Notes (Signed)
Patient did not attend morning orientation/goal group. ?

## 2021-05-21 LAB — CBC WITH DIFFERENTIAL/PLATELET
Abs Immature Granulocytes: 0.06 10*3/uL (ref 0.00–0.07)
Basophils Absolute: 0 10*3/uL (ref 0.0–0.1)
Basophils Relative: 1 %
Eosinophils Absolute: 0.3 10*3/uL (ref 0.0–0.5)
Eosinophils Relative: 4 %
HCT: 36.9 % — ABNORMAL LOW (ref 39.0–52.0)
Hemoglobin: 12.2 g/dL — ABNORMAL LOW (ref 13.0–17.0)
Immature Granulocytes: 1 %
Lymphocytes Relative: 36 %
Lymphs Abs: 2.4 10*3/uL (ref 0.7–4.0)
MCH: 31 pg (ref 26.0–34.0)
MCHC: 33.1 g/dL (ref 30.0–36.0)
MCV: 93.7 fL (ref 80.0–100.0)
Monocytes Absolute: 0.6 10*3/uL (ref 0.1–1.0)
Monocytes Relative: 8 %
Neutro Abs: 3.4 10*3/uL (ref 1.7–7.7)
Neutrophils Relative %: 50 %
Platelets: 230 10*3/uL (ref 150–400)
RBC: 3.94 MIL/uL — ABNORMAL LOW (ref 4.22–5.81)
RDW: 13.2 % (ref 11.5–15.5)
WBC: 6.7 10*3/uL (ref 4.0–10.5)
nRBC: 0 % (ref 0.0–0.2)

## 2021-05-21 LAB — GLUCOSE, CAPILLARY
Glucose-Capillary: 100 mg/dL — ABNORMAL HIGH (ref 70–99)
Glucose-Capillary: 153 mg/dL — ABNORMAL HIGH (ref 70–99)
Glucose-Capillary: 94 mg/dL (ref 70–99)
Glucose-Capillary: 78 mg/dL (ref 70–99)

## 2021-05-21 NOTE — Group Note (Signed)
Date:  05/21/2021 Time:  4:32 PM  Group Topic/Focus:  Wellness Toolbox:   The focus of this group is to discuss various aspects of wellness, balancing those aspects and exploring ways to increase the ability to experience wellness.  Patients will create a wellness toolbox for use upon discharge.    Participation Level:  Minimal  Participation Quality:  Appropriate  Affect:  Appropriate  Cognitive:  Appropriate  Insight: Lacking  Engagement in Group:  Improving  Modes of Intervention:  Discussion  Additional Comments:    Jaquita Rector 05/21/2021, 4:32 PM
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
## 2021-05-21 NOTE — BHH Counselor (Addendum)
This patient met with [facility] home to complete intake assessment. This patient is able to discharge to their facility on Wednesday 05/27/2021. He will need a COViD test and a quantiFERON Gold TB test completed.   Ms. Maretta Bees has also requested that this patient has his ID, SS Card, and Medicaid card.    CSW called and left voicemail for patients CST case worker Crystal Knotts [phone]) to try to gather these items.     Darletta Moll MSW, LCSW Clincal Social Worker  [facility]

## 2021-05-21 NOTE — Progress Notes (Signed)
D:  Mitchell [name] was up and visible on the unit.  He would pace the unit on occasion.  He denied SI/HI or AVH.  He does not appear to be responding to internal stimuli.  He voiced no complaints this evening.  HS blood sugar was 78 and no hs coverage needed.  No prns given at this time. A:  1:1 with RN for support and encouragement.  Medications given as ordered.  Q 15 minute checks maintained for safety.  Encouraged participation in group and unit activities.   R:  He is currently resting with his eyes closed and appears to be asleep.  He remains safe on the unit.

## 2021-05-21 NOTE — Progress Notes (Signed)
Patient compliant with treatment. Patient stated he will soon be going home but does not want to go to group home. Support and encouragement provided. No adverse effect noted.

## 2021-05-21 NOTE — Progress Notes (Signed)
[facility] MD Progress Note   05/21/2021 4:53 PM KAIKOA MAGRO  MRN:  [id number]  Chief Complaint: delusions and SI  Reason for Admission: JOHNATHA ZEIDMAN is a 61 y.o. male with PPHx of Schizophrenia and multiple hospitalizations, who presented to [facility] via IVC for delusion that he is [name] with threats to jump into a trash compactor. He was then admitted Involuntary to [facility] for treatment of schizophrenia exacerbated by medication selective adherence.  Chart Review from last 24 hours:  The patient's chart was reviewed and nursing notes were reviewed. The patient's case was discussed in multidisciplinary team meeting. Per nursing, patient attended select groups.  He had no acute behavioral issues or safety concerns noted and nursing did not see him responding to internal stimuli on exam yesterday. He has been isolating to room  Information Obtained Today During Patient Interview: The patient was seen and evaluated on the unit.  He denies SI, HI, AVH, ideas of reference, paranoia, or first rank symptoms.  He continues to have delusion that he is [name]. He has been in his room all day. He was in for meals as well. He took a long nap today. He is not very interested in conversation today. He has no unanswered questions and no complaints. He does not recall speaking to his son recently.    Principal Problem: Schizophrenia ([place]) Diagnosis: Principal Problem:   Schizophrenia ([place]) Active Problems:   Hypertension   Type 2 diabetes mellitus ([place])   Past Psychiatric History: see H&P  Past Medical History:  Past Medical History:  Diagnosis Date   Bipolar affective disorder ([place])    takes Zyprexa daily   Coarse tremors 10/02/2014   Diabetes mellitus    takes Victoza,Metformin,and Glipizide daily   Hypertension    takes Amlodipine,Lisinopril and Clonidine daily   Hyponatremia    history of   Lithium toxicity 10/02/2014   Mental disorder    takes  Lithium daily   Schizoaffective disorder    takes Trazodone nightly   Schizoaffective disorder ([place]) 01/04/2019   Seasonal allergies    takes Claritin daily   Sleep apnea    sleep study >62yr ago   Stroke ([facility]    left arm weakness    Past Surgical History:  Procedure Laterality Date   CATARACT EXTRACTION W/PHACO Right 02/14/2013   Procedure: CATARACT EXTRACTION PHACO AND INTRAOCULAR LENS PLACEMENT [place];  Surgeon: GAdonis Brook MD;  [place]  Service: Ophthalmology;  Laterality: Right;   CATARACT EXTRACTION W/PHACO Left 06/13/2013   Procedure: CATARACT EXTRACTION PHACO AND INTRAOCULAR LENS PLACEMENT (IOC);  Surgeon: GAdonis Brook MD;  [place]  Service: Ophthalmology;  Laterality: Left;   CIRCUMCISION  20 yrs. ago   EYE SURGERY     Family History:see H&P  Family Psychiatric History: unable to obtain  Social History:  Social History   Substance and Sexual Activity  Alcohol Use Not Currently     Social History   Substance and Sexual Activity  Drug Use No    Social History   Socioeconomic History   Marital status: Divorced    Spouse name: Not on file   Number of children: Not on file   Years of education: Not on file   Highest education level: Not on file  Occupational History   Not on file  Tobacco Use   Smoking status: Never   Smokeless tobacco: Never  Vaping Use   Vaping Use: Never used  Substance and Sexual Activity   Alcohol use: Not Currently   Drug use: No   Sexual activity: Yes    Birth control/protection: None  Other Topics Concern   Not on file  Social History Narrative   Not on file   Social Determinants of Health   Financial Resource Strain: Not on file  Food Insecurity: Not on file  Transportation Needs: Not on file  Physical Activity: Not on file  Stress: Not on file  Social Connections: Not on file   Sleep: Good  Appetite: Good  Current Medications: Current Facility-Administered Medications  Medication Dose Route  Frequency Provider Last Rate Last Admin   acetaminophen (TYLENOL) tablet 650 mg  650 mg Oral Q6H PRN Lucky Rathke, FNP       alum & mag hydroxide-simeth (MAALOX/MYLANTA) 200-200-20 MG/5ML suspension 30 mL  30 mL Oral Q4H PRN Lucky Rathke, FNP       amLODipine (NORVASC) tablet 10 mg  10 mg Oral QHS Merrily Brittle, DO   10 mg at 05/20/21 2100   aspirin EC tablet 81 mg  81 mg Oral Daily Lucky Rathke, FNP   81 mg at 05/21/21 0753   atenolol (TENORMIN) tablet 12.5 mg  12.5 mg Oral Daily Lucky Rathke, FNP   12.5 mg at 05/21/21 0754   chewing gum (ORBIT) sugar free  1 Stick Oral TID PRN Merrily Brittle, DO       cloNIDine (CATAPRES) tablet 0.2 mg  0.2 mg Oral BID Lucky Rathke, FNP   0.2 mg at 05/21/21 0754   cloZAPine (CLOZARIL) tablet 75 mg  75 mg Oral QHS Merrily Brittle, DO   75 mg at 05/20/21 2125   donepezil (ARICEPT) tablet 5 mg  5 mg Oral QHS Merrily Brittle, DO   5 mg at 05/20/21 2127   haloperidol (HALDOL) tablet 15 mg  15 mg Oral QHS Viann Fish E, MD   15 mg at 05/20/21 2100   haloperidol (HALDOL) tablet 5 mg  5 mg Oral Daily Viann Fish E, MD   5 mg at 05/21/21 0754   insulin aspart (novoLOG) injection 0-5 Units  0-5 Units Subcutaneous QHS Harlow Asa, MD   2 Units at 05/20/21 2130   insulin aspart (novoLOG) injection 0-9 Units  0-9 Units Subcutaneous TID WC Harlow Asa, MD   2 Units at 05/21/21 1144   insulin aspart protamine- aspart (NOVOLOG MIX 70/30) injection 35 Units  35 Units Subcutaneous Q supper Merrily Brittle, DO   35 Units at 05/20/21 1757   insulin aspart protamine- aspart (NOVOLOG MIX 70/30) injection 52 Units  52 Units Subcutaneous Q breakfast Merrily Brittle, DO   52 Units at 05/21/21 0757   lisinopril (ZESTRIL) tablet 20 mg  20 mg Oral QHS Nelda Marseille, Amy E, MD   20 mg at 05/20/21 2128   lithium carbonate (ESKALITH) CR tablet 450 mg  450 mg Oral Q12H Lucky Rathke, FNP   450 mg at 05/21/21 0754   OLANZapine zydis (ZYPREXA) disintegrating tablet 10 mg  10 mg Oral Q8H  PRN Corky Sox, MD       And   LORazepam (ATIVAN) tablet 1 mg  1 mg Oral PRN Corky Sox, MD       And   ziprasidone (GEODON) injection 20 mg  20 mg Intramuscular PRN Corky Sox, MD       magnesium hydroxide (MILK OF MAGNESIA) suspension 30 mL  30 mL Oral Daily PRN Lucky Rathke, FNP  senna (SENOKOT) tablet 17.2 mg  2 tablet Oral Daily Merrily Brittle, DO   17.2 mg at 05/21/21 2446    Lab Results:  Results for orders placed or performed during the hospital encounter of 04/23/21 (from the past 48 hour(s))  Glucose, capillary     Status: Abnormal   Collection Time: 05/19/21  5:11 PM  Result Value Ref Range   Glucose-Capillary 149 (H) 70 - 99 mg/dL    Comment: Glucose reference range applies only to samples taken after fasting for at least 8 hours.  Glucose, capillary     Status: Abnormal   Collection Time: 05/19/21  7:25 PM  Result Value Ref Range   Glucose-Capillary 171 (H) 70 - 99 mg/dL    Comment: Glucose reference range applies only to samples taken after fasting for at least 8 hours.  Glucose, capillary     Status: Abnormal   Collection Time: 05/20/21  5:15 AM  Result Value Ref Range   Glucose-Capillary 114 (H) 70 - 99 mg/dL    Comment: Glucose reference range applies only to samples taken after fasting for at least 8 hours.  Glucose, capillary     Status: Abnormal   Collection Time: 05/20/21 12:02 PM  Result Value Ref Range   Glucose-Capillary 157 (H) 70 - 99 mg/dL    Comment: Glucose reference range applies only to samples taken after fasting for at least 8 hours.   Comment 1 Notify RN   Glucose, capillary     Status: None   Collection Time: 05/20/21  5:00 PM  Result Value Ref Range   Glucose-Capillary 88 70 - 99 mg/dL    Comment: Glucose reference range applies only to samples taken after fasting for at least 8 hours.   Comment 1 Notify RN   Glucose, capillary     Status: Abnormal   Collection Time: 05/20/21  9:24 PM  Result Value Ref Range    Glucose-Capillary 240 (H) 70 - 99 mg/dL    Comment: Glucose reference range applies only to samples taken after fasting for at least 8 hours.  Glucose, capillary     Status: Abnormal   Collection Time: 05/21/21  5:45 AM  Result Value Ref Range   Glucose-Capillary 100 (H) 70 - 99 mg/dL    Comment: Glucose reference range applies only to samples taken after fasting for at least 8 hours.  Glucose, capillary     Status: Abnormal   Collection Time: 05/21/21 11:42 AM  Result Value Ref Range   Glucose-Capillary 153 (H) 70 - 99 mg/dL    Comment: Glucose reference range applies only to samples taken after fasting for at least 8 hours.    Blood Alcohol level:  Lab Results  Component Value Date   ETH <10 04/16/2021   ETH <10 28/63/8177    Metabolic Disorder Labs: Lab Results  Component Value Date   HGBA1C 9.2 (H) 04/24/2021   MPG 217.34 04/24/2021   MPG 271.87 01/26/2021   No results found for: PROLACTIN Lab Results  Component Value Date   CHOL 140 01/30/2021   TRIG 95 01/30/2021   HDL 49 01/30/2021   CHOLHDL 2.9 01/30/2021   VLDL 19 01/30/2021   LDLCALC 72 01/30/2021   LDLCALC 67 05/20/2020   Psychiatric Specialty Exam: Physical Exam Vitals and nursing note reviewed.  Constitutional:      Appearance: He is not diaphoretic.  HENT:     Head: Normocephalic.  Eyes:     Extraocular Movements: Extraocular movements intact.  Pulmonary:  Effort: Pulmonary effort is normal. No respiratory distress.  Neurological:     General: No focal deficit present.     Mental Status: He is alert.  Psychiatric:        Behavior: Behavior is cooperative.     Review of Systems  Respiratory:  Negative for shortness of breath.   Cardiovascular:  Negative for chest pain.  Gastrointestinal:  Negative for nausea and vomiting.  Neurological:  Negative for dizziness and headaches.    Body mass index is 37.42 kg/m. Temp:  [97.8 F (36.6 C)] 97.8 F (36.6 C) (02/23 0628) Pulse Rate:  [69-86] 69  (02/23 1605) BP: (106-117)/(68-78) 114/78 (02/23 1605) SpO2:  [99 %] 99 % (02/23 0629)  General Appearance:  Disheveled appearing today wearing dirty top with uncombed hair, no longer malodorous    Eye Contact:   Minimal    Speech:   Rambling and mumbling quality, answers abruptly with short responses to direct questions - nonspontaneous    Volume:   Decreased    Mood:   Described as "good" - appears aloof and ambivalent    Affect:   Constricted     Thought Process:  Concrete but linear with overall poverty of thought  Duration of Psychotic Symptoms:  Greater than six months  Past Diagnosis of Schizophrenia or Psychoactive disorder:  Yes   Orientation:   Oriented to month, year and city and knows he is in a psychiatric hospital but not oriented to person or situation   Thought Content:   Has residual fixed delusion that he is "[name]"; he denies AVH, paranoia, ideas of reference or first rank symptoms.  He is not grossly responding to internal or external stimuli on exam.  He denies SI or HI.   Hallucinations: Denied  Ideas of Reference: Delusions; Paranoia   Suicidal Thoughts:   No   Homicidal Thoughts:   No    Memory:   Immediate - fair; recent and remote poor    Judgement:   Poor    Insight:   Poor    Psychomotor Activity:   no tremor noted, no akathisias, steady gait    Concentration:   Fair    Attention Span:   Fair   Recall:   Poor    Fund of Knowledge:   Limited    Language:   Fair    Assets:   Leisure Time    Sleep:   7.5 hours  AIMS: Facial and Oral Movements Muscles of Facial Expression: None, normal Lips and Perioral Area: None, normal Jaw: None, normal Tongue: None, normal,Extremity Movements Upper (arms, wrists, hands, fingers): None, normal Lower (legs, knees, ankles, toes): None, normal, Trunk Movements Neck, shoulders, hips: None, normal, Overall Severity Severity of abnormal movements (highest score from  questions above): None, normal Incapacitation due to abnormal movements: None, normal Patient's awareness of abnormal movements (rate only patient's report): No Awareness, Dental Status Current problems with teeth and/or dentures?: No Does patient usually wear dentures?: No   AIMS 0 (2/13 and on 2/16) MOCA: 13/30  MMSE: 14/30 Pillbox test: Failed    ASSESSMENT: Diagnoses / Active Problems: Schizophrenia by hx Type II DM HTN Hyponatremia Elevated Creatinine High risk medication use Elevated transaminases Anemia Cognitive impairment (r/o major neurocognitive d/o)  PLAN: Safety and Monitoring:  -- Voluntary admission to inpatient psychiatric unit for safety, stabilization and treatment  -- Daily contact with patient to assess and evaluate symptoms and progress in treatment  -- Patient's case to be discussed in multi-disciplinary  team meeting  -- Observation Level : q15 minute checks  -- Vital signs:  q12 hours  -- Precautions: suicide, elopement, and assault  2. Psychiatric Diagnoses and Treatment:   Schizophrenia by hx -Continued Haldol 5 mg qAM & 15 mg qPM for psychosis -Continued Clozaril 75 mg nightly for psychosis - WBC 6.2 , ANC 3300, Troponin 2, CRP 2.1 (2/17) - repeat labs due 2/24 - Clozaril level 187, NorClozapine 87, and total Cloz + Nor-cloz level 273 (2/5) - QTC 43m on (2/7) - QTC 4215m(2/18) - A1c 9.2 (1/27) and lipids WNL in 11/22 - Continue senokot 2 tabs daily for Clozaril-induced constipation  -Continue Lithium 450 mg BID for mood stability - Li level 0.89 (2/17) - Cr (!) 1.27 (2/17)- trending while on Lithium and po hydration encouraged  - TSH 2.233 (1/31) - ACTT referral made and patient now willing to consider ALF with FL2 signed  Cognitive impairment (r/o major neurocognitive d/o) MOCA: 13/30  MMSE: 14/30 Pillbox test: Failed (per OT patient is not able to manage his own medication. Patient will need 24/7 caregiver support to assist with IADLs  and medication management to be successful)  - Ongoing concern for ability to live independently and to complete IADLs and manage his insulin regimen and medications- He shows no insight or understanding into his complex medication regimen, the risks associated with not being medication compliant, nor his medical or psychiatric diagnoses.. APS report accepted and guardianship recommended by medical providers - Follow-up on patient guardianship recommendations - Patient now receptive to ALF and FL2 completed - Continued Aricept 5 mg qHS for poor memory    3. Medical Issues Being Addressed:   T2DM with insulin dependence - A1C of 9.2. Diabetic coordinator on-board for insulin management -Continued insulin per diabetic coordinator with 70/30 52u with breakfast and continue 70/30 35u with supper  - Changed SSI to sensitive coverage per recommendations of diabetic coordinator - Continued HS coverage -Placed on unit restriction for meals to monitor portions and carb choices   HTN -Lisinopril 2037maily (reduced due to concern for creatinine) -Amlodipine 10 mg daily -Clonidine 0.2 mg BID -Atenolol 12.5mg43mily  Elevated transaminases, down trending - AST 76 and ALT 94 (2/17); Alk phos 161 (2/17) - Med consulted, stated that no further work up necessary - Hepatitis panel nonreactive, ammonia 35, negative RUQ u/s 02/10/21 - Will need outpatient f/u after discharge  Mild Hyponatremia - Na+ 132  on 2/17 - continue to trend  Elevated Creatinine - 1.27 (2/17) - encouraging po hydration and continue to trend  Anemia  - Repeat H/H up to 12.1/36.3 - Ferritin 80, Iron/TIBC WNL, Folate 10.5, B12 366 - F/u with PCP after discharge  4. Discharge Planning:   -- Social work and case management to assist with discharge planning and identification of hospital follow-up needs prior to discharge  -- Discharge Concerns: Need to establish safe housing option given his need for assistance with ADLs and  medications - need for guardianship and potential ALF/group home referral; ACTT referral  -- Discharge Goals: Return home with outpatient referrals for mental health follow-up including medication management/psychotherapy   StepMaida Sale

## 2021-05-21 NOTE — Progress Notes (Signed)
Pt denies SI/HI/AVH and verbally agrees to approach staff if these become apparent or before harming themselves/others. Rates depression 0/10. Rates anxiety 0/10. Rates pain 0/10. Scheduled medications administered to pt, per MD orders. RN provided support and encouragement to pt. Q15 min safety checks implemented and continued. Pt safe on the unit. RN will continue to monitor and intervene as needed.   05/21/21 0753  Psych Admission Type (Psych Patients Only)  Admission Status Voluntary  Psychosocial Assessment  Patient Complaints None  Eye Contact Fair  Facial Expression Blank;Animated  Affect Appropriate to circumstance  Speech Logical/coherent  Interaction Assertive  Motor Activity Other (Comment) (WDL)  Appearance/Hygiene Unremarkable  Behavior Characteristics Cooperative;Appropriate to situation;Calm  Mood Pleasant  Thought Process  Coherency WDL  Content Delusions  Delusions Grandeur  Perception WDL  Hallucination None reported or observed  Judgment Poor  Confusion None  Danger to Self  Current suicidal ideation? Denies  Danger to Others  Danger to Others None reported or observed

## 2021-05-22 ENCOUNTER — Encounter (HOSPITAL_COMMUNITY): Payer: Self-pay

## 2021-05-22 LAB — GLUCOSE, CAPILLARY
Glucose-Capillary: 90 mg/dL (ref 70–99)
Glucose-Capillary: 239 mg/dL — ABNORMAL HIGH (ref 70–99)
Glucose-Capillary: 246 mg/dL — ABNORMAL HIGH (ref 70–99)
Glucose-Capillary: 199 mg/dL — ABNORMAL HIGH (ref 70–99)

## 2021-05-22 NOTE — Group Note (Signed)
LCSW Group Therapy Note   Group Date: 05/22/2021 Start Time: 1300 End Time: 1400  Therapy Type: Group Therapy  Participation Level:  Active  Description of Group: Patients received a worksheet with an outline of 2 faces. One side is designated for what the pt sees about themselves and the other is what others see. Pt's were asked to introduce themselves and share something they like about themself. Pts were then asked to draw, write or color how they view themselves as well as how they are viewed by others. CSW led discussion about the feelings and words associated with each side.   Patient Summary: Pt attended group and remained there the entire time.  The Pt accepted the 2 worksheets and participated by filling in both worksheets.  The Pt participated in the group discussion and demonstrated understanding of the group topic by sharing their thoughts and feelings about who they are and how that compares and contrasts with how they believe other people view them.    Aram Beecham, LCSWA 05/22/2021  2:12 PM

## 2021-05-22 NOTE — [facility] Group Notes (Signed)
Pt did not attend goal group 

## 2021-05-22 NOTE — Progress Notes (Signed)
Pt has been pleasant and cooperative this shift, as well as compliant with his diet.  He has interacted well with his peers and states that he "feels good".    05/22/21 0900  Psych Admission Type (Psych Patients Only)  Admission Status Voluntary  Psychosocial Assessment  Patient Complaints None  Eye Contact Fair  Facial Expression Animated  Affect Appropriate to circumstance  Speech Logical/coherent  Interaction Assertive;Cautious  Appearance/Hygiene Disheveled  Behavior Characteristics Cooperative  Mood Pleasant  Thought Process  Coherency WDL  Content WDL  Delusions None reported or observed  Perception WDL  Hallucination None reported or observed  Judgment Poor  Confusion None  Danger to Self  Current suicidal ideation? Denies  Danger to Others  Danger to Others None reported or observed

## 2021-05-22 NOTE — Progress Notes (Signed)
[facility] MD Progress Note   05/22/2021 7:03 PM Mitchell [name]  MRN:  [id number]  Chief Complaint: delusions and SI  Reason for Admission: Mitchell [name] is a 61 y.o. male with PPHx of Schizophrenia and multiple hospitalizations, who presented to [facility] via IVC for delusion that he is Mitchell [name] and Mitchell [name] with threats to jump into a trash compactor. He was then admitted Involuntary to [facility] for treatment of schizophrenia exacerbated by medication selective adherence.  Chart Review from last 24 hours:  The patient's chart was reviewed and nursing notes were reviewed. The patient's case was discussed in multidisciplinary team meeting. Per nursing, patient attended select groups.  He had no acute behavioral issues or safety concerns noted and nursing did not see him responding to internal stimuli on exam yesterday. He has been isolating to room  Information Obtained Today During Patient Interview: The patient was seen and evaluated on the unit.  He denies SI, HI, AVH, ideas of reference, paranoia, or first rank symptoms.  He does not plan to go with the others for the outside group today. He is going to stay in and take a nap. He did some coloring earlier of his family. He denies new physical pain or complaint. No questions today.    Principal Problem: Schizophrenia ([place]) Diagnosis: Principal Problem:   Schizophrenia ([place]) Active Problems:   Hypertension   Type 2 diabetes mellitus ([place])   Past Psychiatric History: see H&P  Past Medical History:  Past Medical History:  Diagnosis Date   Bipolar affective disorder ([place])    takes Zyprexa daily   Coarse tremors 10/02/2014   Diabetes mellitus    takes Victoza,Metformin,and Glipizide daily   Hypertension    takes Amlodipine,Lisinopril and Clonidine daily   Hyponatremia    history of   Lithium toxicity 10/02/2014   Mental disorder    takes Lithium daily   Schizoaffective disorder    takes Trazodone nightly    Schizoaffective disorder ([place]) 01/04/2019   Seasonal allergies    takes Claritin daily   Sleep apnea    sleep study >41yr ago   Stroke ([facility]    left arm weakness    Past Surgical History:  Procedure Laterality Date   CATARACT EXTRACTION W/PHACO Right 02/14/2013   Procedure: CATARACT EXTRACTION PHACO AND INTRAOCULAR LENS PLACEMENT ([place];  Surgeon: GAdonis Brook MD;  [facility]  Service: Ophthalmology;  Laterality: Right;   CATARACT EXTRACTION W/PHACO Left 06/13/2013   Procedure: CATARACT EXTRACTION PHACO AND INTRAOCULAR LENS PLACEMENT (IOC);  Surgeon: GAdonis Brook MD;  [facility]  Service: Ophthalmology;  Laterality: Left;   CIRCUMCISION  20 yrs. ago   EYE SURGERY     Family History:see H&P  Family Psychiatric History: unable to obtain  Social History:  Social History   Substance and Sexual Activity  Alcohol Use Not Currently     Social History   Substance and Sexual Activity  Drug Use No    Social History   Socioeconomic History   Marital status: Divorced    Spouse name: Not on file   Number of children: Not on file   Years of education: Not on file   Highest education level: Not on file  Occupational History   Not on file  Tobacco Use   Smoking status: Never   Smokeless tobacco: Never  Vaping Use   Vaping Use: Never used  Substance and Sexual Activity   Alcohol use: Not Currently   Drug use:  No   Sexual activity: Yes    Birth control/protection: None  Other Topics Concern   Not on file  Social History Narrative   Not on file   Social Determinants of Health   Financial Resource Strain: Not on file  Food Insecurity: Not on file  Transportation Needs: Not on file  Physical Activity: Not on file  Stress: Not on file  Social Connections: Not on file   Sleep: Good  Appetite: Good  Current Medications: Current Facility-Administered Medications  Medication Dose Route Frequency Provider Last Rate Last Admin   acetaminophen (TYLENOL) tablet  650 mg  650 mg Oral Q6H PRN Lucky Rathke, FNP       alum & mag hydroxide-simeth (MAALOX/MYLANTA) 200-200-20 MG/5ML suspension 30 mL  30 mL Oral Q4H PRN Lucky Rathke, FNP       amLODipine (NORVASC) tablet 10 mg  10 mg Oral QHS Merrily Brittle, DO   10 mg at 05/21/21 2114   aspirin EC tablet 81 mg  81 mg Oral Daily Lucky Rathke, FNP   81 mg at 05/22/21 0102   atenolol (TENORMIN) tablet 12.5 mg  12.5 mg Oral Daily Lucky Rathke, FNP   12.5 mg at 05/22/21 1433   chewing gum (ORBIT) sugar free  1 Stick Oral TID PRN Merrily Brittle, DO       cloNIDine (CATAPRES) tablet 0.2 mg  0.2 mg Oral BID Lucky Rathke, FNP   0.2 mg at 05/22/21 1751   cloZAPine (CLOZARIL) tablet 75 mg  75 mg Oral QHS Merrily Brittle, DO   75 mg at 05/21/21 2114   donepezil (ARICEPT) tablet 5 mg  5 mg Oral QHS Merrily Brittle, DO   5 mg at 05/21/21 2114   haloperidol (HALDOL) tablet 15 mg  15 mg Oral QHS Viann Fish E, MD   15 mg at 05/21/21 2114   haloperidol (HALDOL) tablet 5 mg  5 mg Oral Daily Viann Fish E, MD   5 mg at 05/22/21 0939   insulin aspart (novoLOG) injection 0-5 Units  0-5 Units Subcutaneous QHS Harlow Asa, MD   2 Units at 05/20/21 2130   insulin aspart (novoLOG) injection 0-9 Units  0-9 Units Subcutaneous TID WC Harlow Asa, MD   3 Units at 05/22/21 1743   insulin aspart protamine- aspart (NOVOLOG MIX 70/30) injection 35 Units  35 Units Subcutaneous Q supper Merrily Brittle, DO   35 Units at 05/22/21 1755   insulin aspart protamine- aspart (NOVOLOG MIX 70/30) injection 52 Units  52 Units Subcutaneous Q breakfast Merrily Brittle, DO   52 Units at 05/22/21 1007   lisinopril (ZESTRIL) tablet 20 mg  20 mg Oral QHS Nelda Marseille, Amy E, MD   20 mg at 05/21/21 2114   lithium carbonate (ESKALITH) CR tablet 450 mg  450 mg Oral Q12H Lucky Rathke, FNP   450 mg at 05/22/21 0939   OLANZapine zydis (ZYPREXA) disintegrating tablet 10 mg  10 mg Oral Q8H PRN Corky Sox, MD       And   LORazepam (ATIVAN) tablet 1 mg  1 mg  Oral PRN Corky Sox, MD       And   ziprasidone (GEODON) injection 20 mg  20 mg Intramuscular PRN Corky Sox, MD       magnesium hydroxide (MILK OF MAGNESIA) suspension 30 mL  30 mL Oral Daily PRN Lucky Rathke, FNP       senna (SENOKOT) tablet 17.2 mg  2 tablet Oral  Daily Merrily Brittle, DO   17.2 mg at 05/22/21 1005    Lab Results:  Results for orders placed or performed during the hospital encounter of 04/23/21 (from the past 48 hour(s))  Glucose, capillary     Status: Abnormal   Collection Time: 05/20/21  9:24 PM  Result Value Ref Range   Glucose-Capillary 240 (H) 70 - 99 mg/dL    Comment: Glucose reference range applies only to samples taken after fasting for at least 8 hours.  Glucose, capillary     Status: Abnormal   Collection Time: 05/21/21  5:45 AM  Result Value Ref Range   Glucose-Capillary 100 (H) 70 - 99 mg/dL    Comment: Glucose reference range applies only to samples taken after fasting for at least 8 hours.  Glucose, capillary     Status: Abnormal   Collection Time: 05/21/21 11:42 AM  Result Value Ref Range   Glucose-Capillary 153 (H) 70 - 99 mg/dL    Comment: Glucose reference range applies only to samples taken after fasting for at least 8 hours.  Glucose, capillary     Status: None   Collection Time: 05/21/21  5:04 PM  Result Value Ref Range   Glucose-Capillary 94 70 - 99 mg/dL    Comment: Glucose reference range applies only to samples taken after fasting for at least 8 hours.  CBC with Differential/Platelet     Status: Abnormal   Collection Time: 05/21/21  6:23 PM  Result Value Ref Range   WBC 6.7 4.0 - 10.5 K/uL   RBC 3.94 (L) 4.22 - 5.81 MIL/uL   Hemoglobin 12.2 (L) 13.0 - 17.0 g/dL   HCT 36.9 (L) 39.0 - 52.0 %   MCV 93.7 80.0 - 100.0 fL   MCH 31.0 26.0 - 34.0 pg   MCHC 33.1 30.0 - 36.0 g/dL   RDW 13.2 11.5 - 15.5 %   Platelets 230 150 - 400 K/uL   nRBC 0.0 0.0 - 0.2 %   Neutrophils Relative % 50 %   Neutro Abs 3.4 1.7 - 7.7 K/uL    Lymphocytes Relative 36 %   Lymphs Abs 2.4 0.7 - 4.0 K/uL   Monocytes Relative 8 %   Monocytes Absolute 0.6 0.1 - 1.0 K/uL   Eosinophils Relative 4 %   Eosinophils Absolute 0.3 0.0 - 0.5 K/uL   Basophils Relative 1 %   Basophils Absolute 0.0 0.0 - 0.1 K/uL   Immature Granulocytes 1 %   Abs Immature Granulocytes 0.06 0.00 - 0.07 K/uL    Comment: Performed at [facility], [place] [address] [place], [place] [address]  Glucose, capillary     Status: None   Collection Time: 05/21/21  8:23 PM  Result Value Ref Range   Glucose-Capillary 78 70 - 99 mg/dL    Comment: Glucose reference range applies only to samples taken after fasting for at least 8 hours.  Glucose, capillary     Status: None   Collection Time: 05/22/21  5:43 AM  Result Value Ref Range   Glucose-Capillary 90 70 - 99 mg/dL    Comment: Glucose reference range applies only to samples taken after fasting for at least 8 hours.  Glucose, capillary     Status: Abnormal   Collection Time: 05/22/21 12:49 PM  Result Value Ref Range   Glucose-Capillary 239 (H) 70 - 99 mg/dL    Comment: Glucose reference range applies only to samples taken after fasting for at least 8 hours.  Glucose, capillary  Status: Abnormal   Collection Time: 05/22/21  4:52 PM  Result Value Ref Range   Glucose-Capillary 246 (H) 70 - 99 mg/dL    Comment: Glucose reference range applies only to samples taken after fasting for at least 8 hours.    Blood Alcohol level:  Lab Results  Component Value Date   ETH <10 04/16/2021   ETH <10 03/54/6568    Metabolic Disorder Labs: Lab Results  Component Value Date   HGBA1C 9.2 (H) 04/24/2021   MPG 217.34 04/24/2021   MPG 271.87 01/26/2021   No results found for: PROLACTIN Lab Results  Component Value Date   CHOL 140 01/30/2021   TRIG 95 01/30/2021   HDL 49 01/30/2021   CHOLHDL 2.9 01/30/2021   VLDL 19 01/30/2021   LDLCALC 72 01/30/2021   LDLCALC 67 05/20/2020   Psychiatric Specialty  Exam: Physical Exam Vitals and nursing note reviewed.  Constitutional:      Appearance: He is not diaphoretic.  HENT:     Head: Normocephalic.  Eyes:     Extraocular Movements: Extraocular movements intact.  Pulmonary:     Effort: Pulmonary effort is normal. No respiratory distress.  Neurological:     General: No focal deficit present.     Mental Status: He is alert.  Psychiatric:        Behavior: Behavior is cooperative.     Review of Systems  Respiratory:  Negative for shortness of breath.   Cardiovascular:  Negative for chest pain.  Gastrointestinal:  Negative for nausea and vomiting.  Neurological:  Negative for dizziness and headaches.    Body mass index is 37.42 kg/m. Temp:  [97.9 F (36.6 C)-98.2 F (36.8 C)] 98.2 F (36.8 C) (02/24 1629) Pulse Rate:  [71-86] 76 (02/24 1629) Resp:  [20] 20 (02/24 1629) BP: (113-126)/(61-78) 126/61 (02/24 1629) SpO2:  [98 %-100 %] 98 % (02/24 1629)  General Appearance:  Disheveled appearing today wearing dirty top with uncombed hair, no longer malodorous    Eye Contact:   Minimal    Speech:   Rambling and mumbling quality, answers abruptly with short responses to direct questions - nonspontaneous    Volume:   Decreased    Mood:   Described as "good" - appears aloof and ambivalent    Affect:   Constricted     Thought Process:  Concrete but linear with overall poverty of thought  Duration of Psychotic Symptoms:  Greater than six months  Past Diagnosis of Schizophrenia or Psychoactive disorder:  Yes   Orientation:   Oriented to month, year and city and knows he is in a psychiatric hospital but not oriented to person or situation   Thought Content:   Has residual fixed delusion that he is "[name]"; he denies AVH, paranoia, ideas of reference or first rank symptoms.  He is not grossly responding to internal or external stimuli on exam.  He denies SI or HI.   Hallucinations: Denied  Ideas of  Reference: Delusions; Paranoia   Suicidal Thoughts:   No   Homicidal Thoughts:   No    Memory:   Immediate - fair; recent and remote poor    Judgement:   Poor    Insight:   Poor    Psychomotor Activity:   no tremor noted, no akathisias, steady gait    Concentration:   Fair    Attention Span:   Fair   Recall:   Poor    Fund of Knowledge:   Limited  Language:   Fair    Assets:   Leisure Time    Sleep:   7.5 hours  AIMS: Facial and Oral Movements Muscles of Facial Expression: None, normal Lips and Perioral Area: None, normal Jaw: None, normal Tongue: None, normal,Extremity Movements Upper (arms, wrists, hands, fingers): None, normal Lower (legs, knees, ankles, toes): None, normal, Trunk Movements Neck, shoulders, hips: None, normal, Overall Severity Severity of abnormal movements (highest score from questions above): None, normal Incapacitation due to abnormal movements: None, normal Patient's awareness of abnormal movements (rate only patient's report): No Awareness, Dental Status Current problems with teeth and/or dentures?: No Does patient usually wear dentures?: No   AIMS 0 (2/13 and on 2/16) MOCA: 13/30  MMSE: 14/30 Pillbox test: Failed    ASSESSMENT: Diagnoses / Active Problems: Schizophrenia by hx Type II DM HTN Hyponatremia Elevated Creatinine High risk medication use Elevated transaminases Anemia Cognitive impairment (r/o major neurocognitive d/o)  PLAN: Safety and Monitoring:  -- Voluntary admission to inpatient psychiatric unit for safety, stabilization and treatment  -- Daily contact with patient to assess and evaluate symptoms and progress in treatment  -- Patient's case to be discussed in multi-disciplinary team meeting  -- Observation Level : q15 minute checks  -- Vital signs:  q12 hours  -- Precautions: suicide, elopement, and assault  2. Psychiatric Diagnoses and Treatment:   Schizophrenia by hx -Continued Haldol 5 mg  qAM & 15 mg qPM for psychosis -Continued Clozaril 75 mg nightly for psychosis - WBC 6.2 , ANC 3300, Troponin 2, CRP 2.1 (2/17) - repeat labs due 2/24 - Clozaril level 187, NorClozapine 87, and total Cloz + Nor-cloz level 273 (2/5) - QTC 496m on (2/7) - QTC 4252m(2/18) - A1c 9.2 (1/27) and lipids WNL in 11/22 - Continue senokot 2 tabs daily for Clozaril-induced constipation  -Continue Lithium 450 mg BID for mood stability - Li level 0.89 (2/17) - Cr (!) 1.27 (2/17)- trending while on Lithium and po hydration encouraged  - TSH 2.233 (1/31) - ACTT referral made and patient now willing to consider ALF with FL2 signed  Cognitive impairment (r/o major neurocognitive d/o) MOCA: 13/30  MMSE: 14/30 Pillbox test: Failed (per OT patient is not able to manage his own medication. Patient will need 24/7 caregiver support to assist with IADLs and medication management to be successful)  - Ongoing concern for ability to live independently and to complete IADLs and manage his insulin regimen and medications- He shows no insight or understanding into his complex medication regimen, the risks associated with not being medication compliant, nor his medical or psychiatric diagnoses.. APS report accepted and guardianship recommended by medical providers - Follow-up on patient guardianship recommendations - Patient now receptive to ALF and FL2 completed - Continued Aricept 5 mg qHS for poor memory    3. Medical Issues Being Addressed:   T2DM with insulin dependence - A1C of 9.2. Diabetic coordinator on-board for insulin management -Continued insulin per diabetic coordinator with 70/30 52u with breakfast and continue 70/30 35u with supper  - Changed SSI to sensitive coverage per recommendations of diabetic coordinator - Continued HS coverage -Placed on unit restriction for meals to monitor portions and carb choices   HTN -Lisinopril 2013maily (reduced due to concern for creatinine) -Amlodipine 10 mg  daily -Clonidine 0.2 mg BID -Atenolol 12.5mg71mily  Elevated transaminases, down trending - AST 76 and ALT 94 (2/17); Alk phos 161 (2/17) - Med consulted, stated that no further work up necessary - Hepatitis panel nonreactive,  ammonia 35, negative RUQ u/s 02/10/21 - Will need outpatient f/u after discharge  Mild Hyponatremia - Na+ 132  on 2/17 - continue to trend  Elevated Creatinine - 1.27 (2/17) - encouraging po hydration and continue to trend  Anemia  - Repeat H/H up to 12.1/36.3 - Ferritin 80, Iron/TIBC WNL, Folate 10.5, B12 366 - F/u with PCP after discharge  4. Discharge Planning:   -- Social work and case management to assist with discharge planning and identification of hospital follow-up needs prior to discharge  -- Discharge Concerns: Need to establish safe housing option given his need for assistance with ADLs and medications - need for guardianship and potential ALF/group home referral; ACTT referral  -- Discharge Goals: Return home with outpatient referrals for mental health follow-up including medication management/psychotherapy   Maida Sale, MD

## 2021-05-22 NOTE — BH IP Treatment Plan (Signed)
Interdisciplinary Treatment and Diagnostic Plan Update  05/22/2021 Mitchell [name] MRN: [id number]  Principal Diagnosis: Schizophrenia [facility])  Secondary Diagnoses: Principal Problem:   Schizophrenia ([place]) Active Problems:   Hypertension   Type 2 diabetes mellitus ([place])   Current Medications:  Current Facility-Administered Medications  Medication Dose Route Frequency Provider Last Rate Last Admin   acetaminophen (TYLENOL) tablet 650 mg  650 mg Oral Q6H PRN Lucky Rathke, FNP       alum & mag hydroxide-simeth (MAALOX/MYLANTA) 200-200-20 MG/5ML suspension 30 mL  30 mL Oral Q4H PRN Lucky Rathke, FNP       amLODipine (NORVASC) tablet 10 mg  10 mg Oral QHS Merrily Brittle, DO   10 mg at 05/21/21 2114   aspirin EC tablet 81 mg  81 mg Oral Daily Lucky Rathke, FNP   81 mg at 05/22/21 [id number]   atenolol (TENORMIN) tablet 12.5 mg  12.5 mg Oral Daily Lucky Rathke, FNP   12.5 mg at 05/22/21 1433   chewing gum (ORBIT) sugar free  1 Stick Oral TID PRN Merrily Brittle, DO       cloNIDine (CATAPRES) tablet 0.2 mg  0.2 mg Oral BID Lucky Rathke, FNP   0.2 mg at 05/22/21 [id number]   cloZAPine (CLOZARIL) tablet 75 mg  75 mg Oral QHS Merrily Brittle, DO   75 mg at 05/21/21 2114   donepezil (ARICEPT) tablet 5 mg  5 mg Oral QHS Merrily Brittle, DO   5 mg at 05/21/21 2114   haloperidol (HALDOL) tablet 15 mg  15 mg Oral QHS Viann Fish E, MD   15 mg at 05/21/21 2114   haloperidol (HALDOL) tablet 5 mg  5 mg Oral Daily Viann Fish E, MD   5 mg at 05/22/21 0939   insulin aspart (novoLOG) injection 0-5 Units  0-5 Units Subcutaneous QHS Harlow Asa, MD   2 Units at 05/20/21 2130   insulin aspart (novoLOG) injection 0-9 Units  0-9 Units Subcutaneous TID WC Harlow Asa, MD   2 Units at 05/21/21 1144   insulin aspart protamine- aspart (NOVOLOG MIX 70/30) injection 35 Units  35 Units Subcutaneous Q supper Merrily Brittle, DO   35 Units at 05/21/21 1710   insulin aspart protamine- aspart (NOVOLOG MIX 70/30) injection 52  Units  52 Units Subcutaneous Q breakfast Merrily Brittle, DO   52 Units at 05/22/21 1007   lisinopril (ZESTRIL) tablet 20 mg  20 mg Oral QHS Nelda Marseille, Amy E, MD   20 mg at 05/21/21 2114   lithium carbonate (ESKALITH) CR tablet 450 mg  450 mg Oral Q12H Lucky Rathke, FNP   450 mg at 05/22/21 0939   OLANZapine zydis (ZYPREXA) disintegrating tablet 10 mg  10 mg Oral Q8H PRN Corky Sox, MD       And   LORazepam (ATIVAN) tablet 1 mg  1 mg Oral PRN Corky Sox, MD       And   ziprasidone (GEODON) injection 20 mg  20 mg Intramuscular PRN Corky Sox, MD       magnesium hydroxide (MILK OF MAGNESIA) suspension 30 mL  30 mL Oral Daily PRN Lucky Rathke, FNP       senna (SENOKOT) tablet 17.2 mg  2 tablet Oral Daily Merrily Brittle, DO   17.2 mg at 05/22/21 1005   PTA Medications: Medications Prior to Admission  Medication Sig Dispense Refill Last Dose   amLODipine (NORVASC) 10 MG tablet Take 1 tablet (10 mg total) by  mouth daily. (Patient not taking: Reported on 04/16/2021) 90 tablet 1    aspirin 81 MG EC tablet Take 1 tablet (81 mg total) by mouth daily. Swallow whole. (Patient not taking: Reported on 04/16/2021) 30 tablet 12    atenolol (TENORMIN) 25 MG tablet Take 0.5 tablets (12.5 mg total) by mouth daily. (Patient not taking: Reported on 04/16/2021) 15 tablet 0    benztropine (COGENTIN) 1 MG tablet Take 1 tablet (1 mg total) by mouth 2 (two) times daily. 60 tablet 2    cloNIDine (CATAPRES) 0.2 MG tablet Take 0.2 mg by mouth 2 (two) times daily. (Patient not taking: Reported on 01/26/2021)      cloZAPine (CLOZARIL) 50 MG tablet Take 1 tablet (50 mg total) by mouth 2 (two) times daily. 60 tablet 0    glipiZIDE (GLUCOTROL XL) 10 MG 24 hr tablet Take 2 tablets (20 mg total) by mouth daily with breakfast. 60 tablet 0    haloperidol (HALDOL) 20 MG tablet Take 1 tablet (20 mg total) by mouth at bedtime. (Patient not taking: Reported on 01/26/2021) 90 tablet 1    insulin aspart (NOVOLOG) 100 UNIT/ML  injection Inject 0-9 Units into the skin 3 (three) times daily with meals. 10 mL 11    insulin aspart protamine - aspart (NOVOLOG MIX 70/30 FLEXPEN) (70-30) 100 UNIT/ML FlexPen Inject 48 Units into the skin daily with breakfast. 15 mL 0    insulin aspart protamine- aspart (NOVOLOG MIX 70/30) (70-30) 100 UNIT/ML injection Inject 0.42 mLs (42 Units total) into the skin daily with supper. 10 mL 11    lisinopril (ZESTRIL) 40 MG tablet Take 1 tablet (40 mg total) by mouth daily. (Patient not taking: Reported on 01/11/2020) 90 tablet 1    lithium carbonate (ESKALITH) 450 MG CR tablet Take 1 tablet (450 mg total) by mouth every 12 (twelve) hours. (Patient not taking: Reported on 01/26/2021) 60 tablet 2    traZODone (DESYREL) 50 MG tablet Take 1 tablet (50 mg total) by mouth at bedtime as needed for sleep.       Patient Stressors: Marital or family conflict   Medication change or noncompliance    Patient Strengths: Psychologist, clinical for treatment/growth   Treatment Modalities: Medication Management, Group therapy, Case management,  1 to 1 session with clinician, Psychoeducation, Recreational therapy.   Physician Treatment Plan for Primary Diagnosis: Schizophrenia ([place]) Long Term Goal(s):     Short Term Goals:    Medication Management: Evaluate patient's response, side effects, and tolerance of medication regimen.  Therapeutic Interventions: 1 to 1 sessions, Unit Group sessions and Medication administration.  Evaluation of Outcomes: Progressing  Physician Treatment Plan for Secondary Diagnosis: Principal Problem:   Schizophrenia ([place]) Active Problems:   Hypertension   Type 2 diabetes mellitus ([place])  Long Term Goal(s):     Short Term Goals:       Medication Management: Evaluate patient's response, side effects, and tolerance of medication regimen.  Therapeutic Interventions: 1 to 1 sessions, Unit Group sessions and Medication administration.  Evaluation of  Outcomes: Progressing   RN Treatment Plan for Primary Diagnosis: Schizophrenia ([place]) Long Term Goal(s): Knowledge of disease and therapeutic regimen to maintain health will improve  Short Term Goals: Ability to participate in decision making will improve, Ability to verbalize feelings will improve, and Ability to identify and develop effective coping behaviors will improve  Medication Management: RN will administer medications as ordered by provider, will assess and evaluate patient's response and provide education to  patient for prescribed medication. RN will report any adverse and/or side effects to prescribing provider.  Therapeutic Interventions: 1 on 1 counseling sessions, Psychoeducation, Medication administration, Evaluate responses to treatment, Monitor vital signs and CBGs as ordered, Perform/monitor CIWA, COWS, AIMS and Fall Risk screenings as ordered, Perform wound care treatments as ordered.  Evaluation of Outcomes: Progressing   LCSW Treatment Plan for Primary Diagnosis: Schizophrenia ([place]) Long Term Goal(s): Safe transition to appropriate next level of care at discharge, Engage patient in therapeutic group addressing interpersonal concerns.  Short Term Goals: Engage patient in aftercare planning with referrals and resources, Increase emotional regulation, Facilitate acceptance of mental health diagnosis and concerns, and Facilitate patient progression through stages of change regarding substance use diagnoses and concerns  Therapeutic Interventions: Assess for all discharge needs, 1 to 1 time with Social worker, Explore available resources and support systems, Assess for adequacy in community support network, Educate family and significant other(s) on suicide prevention, Complete Psychosocial Assessment, Interpersonal group therapy.  Evaluation of Outcomes: Progressing   Progress in Treatment: Attending groups: Yes. Participating in groups: Yes. Taking medication as  prescribed: Yes. Toleration medication: Yes. Family/Significant other contact made: Yes, individual(s) contacted:  None provided Patient understands diagnosis: No. Discussing patient identified problems/goals with staff: Yes. Medical problems stabilized or resolved: Yes. Denies suicidal/homicidal ideation: Yes. Issues/concerns per patient self-inventory: No. Other: None  New problem(s) identified: No, Describe:  None  New Short Term/Long Term Goal(s):medication stabilization, elimination of SI thoughts, development of comprehensive mental wellness plan.   Patient Goals:    Discharge Plan or Barriers: Patient recently admitted. CSW will continue to follow and assess for appropriate referrals and possible discharge planning.   Reason for Continuation of Hospitalization: Medication stabilization  Estimated Length of Stay: 3-5 days   Scribe for Treatment Team: Eliott Nine 05/22/2021 4:16 PM

## 2021-05-22 NOTE — [facility] Group Notes (Signed)
Adult Psychoeducational Group Note  Date:  05/22/2021 Time:  3:00 PM  Group Topic/Focus:  Wellness Toolbox:   The focus of this group is to discuss various aspects of wellness, balancing those aspects and exploring ways to increase the ability to experience wellness.  Patients will create a wellness toolbox for use upon discharge.  Participation Level:  Active  Participation Quality:  Appropriate  Affect:  Appropriate  Cognitive:  Appropriate  Insight: Appropriate  Engagement in Group:  Engaged  Modes of Intervention:  Activity  Additional Comments:  Patient attended and participated in the relaxation group activity.  Jearl Klinefelter 05/22/2021, 3:00 PM

## 2021-05-22 NOTE — Group Note (Signed)
Recreation Therapy Group Note   Group Topic:Problem Solving  Group Date: 05/22/2021 Start Time: 0930 End Time: 8003 Facilitators: Caroll Rancher, LRT,CTRS Location: [address] Day[address]   Goal Area(s) Addresses:  Patient will effectively work with peer towards shared goal.  Patient will identify skills used to make activity successful.  Patient will identify how skills used during activity can be applied to reach post d/c goals.   Group Description: [employer]. In teams of 5-6, patients were given 25 small craft pipe cleaners. Using the materials provided, patients were instructed to compete again the opposing team(s) to build the tallest free-standing structure from floor level. The activity was timed; difficulty increased by Clinical research associate as Production designer, theatre/television/film continued.  Systematically resources were removed with additional directions for example, placing one arm behind their back, working in silence, and shape stipulations.   Affect/Mood: N/A   Participation Level: Did not attend    Clinical Observations/Individualized Feedback:     Plan: Continue to engage patient in RT group sessions 2-3x/week.   Caroll Rancher, LRT,CTRS 05/22/2021 1:21 PM

## 2021-05-22 NOTE — Progress Notes (Signed)
Patient did not attend wrap up AA group.

## 2021-05-23 LAB — GLUCOSE, CAPILLARY
Glucose-Capillary: 132 mg/dL — ABNORMAL HIGH (ref 70–99)
Glucose-Capillary: 98 mg/dL (ref 70–99)
Glucose-Capillary: 181 mg/dL — ABNORMAL HIGH (ref 70–99)
Glucose-Capillary: 154 mg/dL — ABNORMAL HIGH (ref 70–99)

## 2021-05-23 NOTE — Progress Notes (Signed)
°   05/23/21 2156  Psych Admission Type (Psych Patients Only)  Admission Status Voluntary  Psychosocial Assessment  Patient Complaints None  Eye Contact Fair  Facial Expression Flat  Affect Appropriate to circumstance  Speech Logical/coherent  Interaction Guarded  Motor Activity Slow  Appearance/Hygiene Improved  Behavior Characteristics Appropriate to situation  Mood Depressed  Thought Process  Coherency WDL  Content WDL  Delusions None reported or observed  Perception WDL  Hallucination None reported or observed  Judgment Poor  Confusion None  Danger to Self  Current suicidal ideation? Denies  Danger to Others  Danger to Others None reported or observed

## 2021-05-23 NOTE — Progress Notes (Signed)
[place] Group Notes:  (Nursing/MHT/Case Management/Adjunct)  Date:  05/23/2021  Time:  2015  Type of Therapy:   wrap up group  Participation Level:  Active  Participation Quality:  Appropriate, Attentive, Sharing, and Supportive  Affect:  Appropriate  Cognitive:  Alert  Insight:  Improving  Engagement in Group:  Engaged  Modes of Intervention:  Clarification, Education, and Support  Summary of Progress/Problems: Positive thinking and positive change were discussed.   Winfield Rast S 05/23/2021, 9:01 PM

## 2021-05-23 NOTE — [facility] Group Notes (Signed)
.  Psychoeducational Group Note    Date:05/23/21 Time: 1300-1400    Purpose of Group: . The group focus' on teaching patients on how to identify their needs and their Life Skills:  A group where two lists are made. What people need and what are things that we do that are unhealthy. The lists are developed by the patients and it is explained that we often do the actions that are not healthy to get our list of needs met.  Goal:: to develop the coping skills needed to get their needs met  Participation Level:  Active  Participation Quality:  Appropriate  Affect:  Appropriate  Cognitive:  Oriented  Insight:  Improving  Engagement in Group:  Engaged  Additional Comments: attended briefly. Rates his energy as a 10/10. Is not talking to himself anymore.  Mitchell [name]

## 2021-05-23 NOTE — Progress Notes (Signed)
°   05/23/21 1100  Psych Admission Type (Psych Patients Only)  Admission Status Voluntary  Psychosocial Assessment  Patient Complaints None  Eye Contact Fair  Facial Expression Flat  Affect Appropriate to circumstance  Speech Logical/coherent  Interaction Assertive  Motor Activity Slow  Appearance/Hygiene Improved  Behavior Characteristics Cooperative  Mood Depressed  Thought Process  Coherency WDL  Content WDL  Delusions None reported or observed  Perception WDL  Hallucination None reported or observed  Judgment Poor  Confusion None  Danger to Self  Current suicidal ideation? Denies  Danger to Others  Danger to Others None reported or observed

## 2021-05-23 NOTE — Group Note (Signed)
LCSW Group Therapy ° ° °Due to high patient acuity and staffing, group was unable to be held on 05/23/2021. The CSW supervisor as well as the on-duty AC was made aware. ° °Jeimy Bickert T Indigo Chaddock LCSWA  °2:49 PM ° °

## 2021-05-23 NOTE — Progress Notes (Signed)
°   05/23/21 0033  Psych Admission Type (Psych Patients Only)  Admission Status Voluntary  Psychosocial Assessment  Patient Complaints None  Eye Contact Fair  Facial Expression Flat  Affect Appropriate to circumstance  Speech Logical/coherent  Interaction Assertive;Cautious  Motor Activity Other (Comment) (WDL)  Appearance/Hygiene Improved  Behavior Characteristics Appropriate to situation  Mood Depressed  Thought Process  Coherency WDL  Content WDL  Delusions None reported or observed  Perception WDL  Hallucination None reported or observed  Judgment Poor  Confusion None  Danger to Self  Current suicidal ideation? Denies  Danger to Others  Danger to Others None reported or observed

## 2021-05-23 NOTE — Progress Notes (Signed)
[facility] MD Progress Note   05/23/2021 6:33 PM BHAVIK CABINESS  MRN:  [id number]  Chief Complaint: delusions and SI  Reason for Admission: CARLITOS BOTTINO is a 61 y.o. male with PPHx of Schizophrenia and multiple hospitalizations, who presented to [facility] via IVC for delusion that he is [name] with threats to jump into a trash compactor. He was then admitted Involuntary to [facility] for treatment of schizophrenia exacerbated by medication selective adherence.  Chart Review from last 24 hours:  The patient's chart was reviewed and nursing notes were reviewed. The patient's case was discussed in multidisciplinary team meeting. Per nursing, patient attended select groups.  He had no acute behavioral issues or safety concerns noted. He has been isolating to room, but is more likely to come out if called by name to do so.  Information Obtained Today During Patient Interview: The patient was seen and evaluated in his room.  He denies SI, HI, AVH, ideas of reference, paranoia, or first rank symptoms.  He was up and out in the morning for group and did watch some television with his peers. He was in brighter spirits, and I did see him out and about again in the afternoon. He does seem to like to be invited to attend by name. He feels "great" today. He likes the corndogs at lunch.    Principal Problem: Schizophrenia ([place]) Diagnosis: Principal Problem:   Schizophrenia ([place]) Active Problems:   Hypertension   Type 2 diabetes mellitus ([place])   Past Psychiatric History: see H&P  Past Medical History:  Past Medical History:  Diagnosis Date   Bipolar affective disorder ([place])    takes Zyprexa daily   Coarse tremors 10/02/2014   Diabetes mellitus    takes Victoza,Metformin,and Glipizide daily   Hypertension    takes Amlodipine,Lisinopril and Clonidine daily   Hyponatremia    history of   Lithium toxicity 10/02/2014   Mental disorder    takes Lithium daily    Schizoaffective disorder    takes Trazodone nightly   Schizoaffective disorder ([place]) 01/04/2019   Seasonal allergies    takes Claritin daily   Sleep apnea    sleep study >14yr ago   Stroke ([facility]    left arm weakness    Past Surgical History:  Procedure Laterality Date   CATARACT EXTRACTION W/PHACO Right 02/14/2013   Procedure: CATARACT EXTRACTION PHACO AND INTRAOCULAR LENS PLACEMENT ([place]  Surgeon: GAdonis Brook MD;  [place]  Service: Ophthalmology;  Laterality: Right;   CATARACT EXTRACTION W/PHACO Left 06/13/2013   Procedure: CATARACT EXTRACTION PHACO AND INTRAOCULAR LENS PLACEMENT (IOC);  Surgeon: GAdonis Brook MD;  [place]  Service: Ophthalmology;  Laterality: Left;   CIRCUMCISION  20 yrs. ago   EYE SURGERY     Family History:see H&P  Family Psychiatric History: unable to obtain  Social History:  Social History   Substance and Sexual Activity  Alcohol Use Not Currently     Social History   Substance and Sexual Activity  Drug Use No    Social History   Socioeconomic History   Marital status: Divorced    Spouse name: Not on file   Number of children: Not on file   Years of education: Not on file   Highest education level: Not on file  Occupational History   Not on file  Tobacco Use   Smoking status: Never   Smokeless tobacco: Never  Vaping Use   Vaping Use: Never  used  Substance and Sexual Activity   Alcohol use: Not Currently   Drug use: No   Sexual activity: Yes    Birth control/protection: None  Other Topics Concern   Not on file  Social History Narrative   Not on file   Social Determinants of Health   Financial Resource Strain: Not on file  Food Insecurity: Not on file  Transportation Needs: Not on file  Physical Activity: Not on file  Stress: Not on file  Social Connections: Not on file   Sleep: Good  Appetite: Good  Current Medications: Current Facility-Administered Medications  Medication Dose Route Frequency Provider  Last Rate Last Admin   acetaminophen (TYLENOL) tablet 650 mg  650 mg Oral Q6H PRN Lucky Rathke, FNP       alum & mag hydroxide-simeth (MAALOX/MYLANTA) 200-200-20 MG/5ML suspension 30 mL  30 mL Oral Q4H PRN Lucky Rathke, FNP       amLODipine (NORVASC) tablet 10 mg  10 mg Oral QHS Merrily Brittle, DO   10 mg at 05/22/21 2109   aspirin EC tablet 81 mg  81 mg Oral Daily Lucky Rathke, FNP   81 mg at 05/23/21 0752   atenolol (TENORMIN) tablet 12.5 mg  12.5 mg Oral Daily Lucky Rathke, FNP   12.5 mg at 05/23/21 1829   chewing gum (ORBIT) sugar free  1 Stick Oral TID PRN Merrily Brittle, DO       cloNIDine (CATAPRES) tablet 0.2 mg  0.2 mg Oral BID Lucky Rathke, FNP   0.2 mg at 05/23/21 1652   cloZAPine (CLOZARIL) tablet 75 mg  75 mg Oral QHS Merrily Brittle, DO   75 mg at 05/22/21 2108   donepezil (ARICEPT) tablet 5 mg  5 mg Oral QHS Merrily Brittle, DO   5 mg at 05/22/21 2108   haloperidol (HALDOL) tablet 15 mg  15 mg Oral QHS Viann Fish E, MD   15 mg at 05/22/21 2109   haloperidol (HALDOL) tablet 5 mg  5 mg Oral Daily Viann Fish E, MD   5 mg at 05/23/21 0753   insulin aspart (novoLOG) injection 0-5 Units  0-5 Units Subcutaneous QHS Harlow Asa, MD   2 Units at 05/20/21 2130   insulin aspart (novoLOG) injection 0-9 Units  0-9 Units Subcutaneous TID WC Harlow Asa, MD   2 Units at 05/23/21 1648   insulin aspart protamine- aspart (NOVOLOG MIX 70/30) injection 35 Units  35 Units Subcutaneous Q supper Merrily Brittle, DO   35 Units at 05/23/21 1648   insulin aspart protamine- aspart (NOVOLOG MIX 70/30) injection 52 Units  52 Units Subcutaneous Q breakfast Merrily Brittle, DO   52 Units at 05/23/21 0757   lisinopril (ZESTRIL) tablet 20 mg  20 mg Oral QHS Nelda Marseille, Amy E, MD   20 mg at 05/22/21 2108   lithium carbonate (ESKALITH) CR tablet 450 mg  450 mg Oral Q12H Lucky Rathke, FNP   450 mg at 05/23/21 0753   OLANZapine zydis (ZYPREXA) disintegrating tablet 10 mg  10 mg Oral Q8H PRN Corky Sox,  MD       And   LORazepam (ATIVAN) tablet 1 mg  1 mg Oral PRN Corky Sox, MD       And   ziprasidone (GEODON) injection 20 mg  20 mg Intramuscular PRN Corky Sox, MD       magnesium hydroxide (MILK OF MAGNESIA) suspension 30 mL  30 mL Oral Daily PRN Lucky Rathke,  FNP       senna (SENOKOT) tablet 17.2 mg  2 tablet Oral Daily Merrily Brittle, DO   17.2 mg at 05/23/21 0102    Lab Results:  Results for orders placed or performed during the hospital encounter of 04/23/21 (from the past 48 hour(s))  Glucose, capillary     Status: None   Collection Time: 05/21/21  8:23 PM  Result Value Ref Range   Glucose-Capillary 78 70 - 99 mg/dL    Comment: Glucose reference range applies only to samples taken after fasting for at least 8 hours.  Glucose, capillary     Status: None   Collection Time: 05/22/21  5:43 AM  Result Value Ref Range   Glucose-Capillary 90 70 - 99 mg/dL    Comment: Glucose reference range applies only to samples taken after fasting for at least 8 hours.  Glucose, capillary     Status: Abnormal   Collection Time: 05/22/21 12:49 PM  Result Value Ref Range   Glucose-Capillary 239 (H) 70 - 99 mg/dL    Comment: Glucose reference range applies only to samples taken after fasting for at least 8 hours.  Glucose, capillary     Status: Abnormal   Collection Time: 05/22/21  4:52 PM  Result Value Ref Range   Glucose-Capillary 246 (H) 70 - 99 mg/dL    Comment: Glucose reference range applies only to samples taken after fasting for at least 8 hours.  Glucose, capillary     Status: Abnormal   Collection Time: 05/22/21  8:16 PM  Result Value Ref Range   Glucose-Capillary 199 (H) 70 - 99 mg/dL    Comment: Glucose reference range applies only to samples taken after fasting for at least 8 hours.   Comment 1 Notify RN    Comment 2 Document in Chart   Glucose, capillary     Status: Abnormal   Collection Time: 05/23/21  6:10 AM  Result Value Ref Range   Glucose-Capillary 132 (H) 70 -  99 mg/dL    Comment: Glucose reference range applies only to samples taken after fasting for at least 8 hours.   Comment 1 Notify RN    Comment 2 Document in Chart   Glucose, capillary     Status: None   Collection Time: 05/23/21 11:43 AM  Result Value Ref Range   Glucose-Capillary 98 70 - 99 mg/dL    Comment: Glucose reference range applies only to samples taken after fasting for at least 8 hours.  Glucose, capillary     Status: Abnormal   Collection Time: 05/23/21  4:24 PM  Result Value Ref Range   Glucose-Capillary 181 (H) 70 - 99 mg/dL    Comment: Glucose reference range applies only to samples taken after fasting for at least 8 hours.    Blood Alcohol level:  Lab Results  Component Value Date   ETH <10 04/16/2021   ETH <10 72/53/6644    Metabolic Disorder Labs: Lab Results  Component Value Date   HGBA1C 9.2 (H) 04/24/2021   MPG 217.34 04/24/2021   MPG 271.87 01/26/2021   No results found for: PROLACTIN Lab Results  Component Value Date   CHOL 140 01/30/2021   TRIG 95 01/30/2021   HDL 49 01/30/2021   CHOLHDL 2.9 01/30/2021   VLDL 19 01/30/2021   LDLCALC 72 01/30/2021   LDLCALC 67 05/20/2020   Psychiatric Specialty Exam: Physical Exam Vitals and nursing note reviewed.  Constitutional:      Appearance: He is not diaphoretic.  HENT:     Head: Normocephalic.  Eyes:     Extraocular Movements: Extraocular movements intact.  Pulmonary:     Effort: Pulmonary effort is normal. No respiratory distress.  Neurological:     General: No focal deficit present.     Mental Status: He is alert.  Psychiatric:        Behavior: Behavior is cooperative.     Review of Systems  Respiratory:  Negative for shortness of breath.   Cardiovascular:  Negative for chest pain.  Gastrointestinal:  Negative for nausea and vomiting.  Neurological:  Negative for dizziness and headaches.    Body mass index is 37.42 kg/m. Temp:  [98.1 F (36.7 C)] 98.1 F (36.7 C) (02/25 1655) Pulse  Rate:  [72-86] 76 (02/25 1655) Resp:  [18] 18 (02/25 0630) BP: (118-144)/(73-84) 131/84 (02/25 1655) SpO2:  [99 %-100 %] 100 % (02/25 1655)  General Appearance:  Disheveled appearing today wearing dirty top with uncombed hair, no longer malodorous    Eye Contact:   Minimal    Speech:   Rambling and mumbling quality, answers abruptly with short responses to direct questions - nonspontaneous    Volume:   Decreased    Mood:   Described as "good" - appears aloof and ambivalent    Affect:   Constricted     Thought Process:  Concrete but linear with overall poverty of thought  Duration of Psychotic Symptoms:  Greater than six months  Past Diagnosis of Schizophrenia or Psychoactive disorder:  Yes   Orientation:   Oriented to month, year and city and knows he is in a psychiatric hospital but not oriented to person or situation   Thought Content:   Has residual fixed delusion that he is "[name]"; he denies AVH, paranoia, ideas of reference or first rank symptoms.  He is not grossly responding to internal or external stimuli on exam.  He denies SI or HI.   Hallucinations: Denied  Ideas of Reference: Delusions; Paranoia   Suicidal Thoughts:   No   Homicidal Thoughts:   No    Memory:   Immediate - fair; recent and remote poor    Judgement:   Poor    Insight:   Poor    Psychomotor Activity:   no tremor noted, no akathisias, steady gait    Concentration:   Fair    Attention Span:   Fair   Recall:   Poor    Fund of Knowledge:   Limited    Language:   Fair    Assets:   Leisure Time    Sleep:   7.5 hours  AIMS: Facial and Oral Movements Muscles of Facial Expression: None, normal Lips and Perioral Area: None, normal Jaw: None, normal Tongue: None, normal,Extremity Movements Upper (arms, wrists, hands, fingers): None, normal Lower (legs, knees, ankles, toes): None, normal, Trunk Movements Neck, shoulders, hips: None, normal, Overall  Severity Severity of abnormal movements (highest score from questions above): None, normal Incapacitation due to abnormal movements: None, normal Patient's awareness of abnormal movements (rate only patient's report): No Awareness, Dental Status Current problems with teeth and/or dentures?: No Does patient usually wear dentures?: No   AIMS 0 (2/13 and on 2/16) MOCA: 13/30  MMSE: 14/30 Pillbox test: Failed    ASSESSMENT: Diagnoses / Active Problems: Schizophrenia by hx Type II DM HTN Hyponatremia Elevated Creatinine High risk medication use Elevated transaminases Anemia Cognitive impairment (r/o major neurocognitive d/o)  PLAN: Safety and Monitoring:  -- Voluntary admission to inpatient  psychiatric unit for safety, stabilization and treatment  -- Daily contact with patient to assess and evaluate symptoms and progress in treatment  -- Patient's case to be discussed in multi-disciplinary team meeting  -- Observation Level : q15 minute checks  -- Vital signs:  q12 hours  -- Precautions: suicide, elopement, and assault  2. Psychiatric Diagnoses and Treatment:   Schizophrenia by hx -Continued Haldol 5 mg qAM & 15 mg qPM for psychosis -Continued Clozaril 75 mg nightly for psychosis - WBC 6.2 , ANC 3300, Troponin 2, CRP 2.1 (2/17) - repeat labs due 2/24 - Clozaril level 187, NorClozapine 87, and total Cloz + Nor-cloz level 273 (2/5) - QTC 412m on (2/7) - QTC 4272m(2/18) - A1c 9.2 (1/27) and lipids WNL in 11/22 - Continue senokot 2 tabs daily for Clozaril-induced constipation  -Continue Lithium 450 mg BID for mood stability - Li level 0.89 (2/17) - Cr (!) 1.27 (2/17)- trending while on Lithium and po hydration encouraged  - TSH 2.233 (1/31) - ACTT referral made and patient now willing to consider ALF with FL2 signed  Cognitive impairment (r/o major neurocognitive d/o) MOCA: 13/30  MMSE: 14/30 Pillbox test: Failed (per OT patient is not able to manage his own medication.  Patient will need 24/7 caregiver support to assist with IADLs and medication management to be successful)  - Ongoing concern for ability to live independently and to complete IADLs and manage his insulin regimen and medications- He shows no insight or understanding into his complex medication regimen, the risks associated with not being medication compliant, nor his medical or psychiatric diagnoses.. APS report accepted and guardianship recommended by medical providers - Follow-up on patient guardianship recommendations - Patient now receptive to ALF and FL2 completed - Continued Aricept 5 mg qHS for poor memory    3. Medical Issues Being Addressed:   T2DM with insulin dependence - A1C of 9.2. Diabetic coordinator on-board for insulin management -Continued insulin per diabetic coordinator with 70/30 52u with breakfast and continue 70/30 35u with supper  - Changed SSI to sensitive coverage per recommendations of diabetic coordinator - Continued HS coverage -Placed on unit restriction for meals to monitor portions and carb choices   HTN -Lisinopril 2072maily (reduced due to concern for creatinine) -Amlodipine 10 mg daily -Clonidine 0.2 mg BID -Atenolol 12.5mg20mily  Elevated transaminases, down trending - AST 76 and ALT 94 (2/17); Alk phos 161 (2/17) - Med consulted, stated that no further work up necessary - Hepatitis panel nonreactive, ammonia 35, negative RUQ u/s 02/10/21 - Will need outpatient f/u after discharge  Mild Hyponatremia - Na+ 132  on 2/17 - continue to trend  Elevated Creatinine - 1.27 (2/17) - encouraging po hydration and continue to trend  Anemia  - Repeat H/H up to 12.1/36.3 - Ferritin 80, Iron/TIBC WNL, Folate 10.5, B12 366 - F/u with PCP after discharge  4. Discharge Planning:   -- Social work and case management to assist with discharge planning and identification of hospital follow-up needs prior to discharge  -- Discharge Concerns: Need to establish safe  housing option given his need for assistance with ADLs and medications - need for guardianship and potential ALF/group home referral; ACTT referral  -- Discharge Goals: Return home with outpatient referrals for mental health follow-up including medication management/psychotherapy   StepMaida Sale

## 2021-05-23 NOTE — [facility] Group Notes (Signed)
Goals Group 05/23/2021   Group Focus: affirmation, clarity of thought, and goals/reality orientation Treatment Modality:  Psychoeducation Interventions utilized were assignment, group exercise, and support Purpose: To be able to understand and verbalize the reason for their admission to the hospital. To understand that the medication helps with their chemical imbalance but they also need to work on their choices in life. To be challenged to develop a list of 30 positives about themselves. Also introduce the concept that "feelings" are not reality.  Participation Level:  Active  Participation Quality:  Appropriate  Affect:  Appropriate  Cognitive:  Appropriate  Insight:  Improving  Engagement in Group:  Engaged  Additional Comments:  Pt came in the group this morning but left shortly afterwards. States that he rates his energy at a 4/10 and is here because he didn't take his medications.  Dione Housekeeper

## 2021-05-24 DIAGNOSIS — F209 Schizophrenia, unspecified: Secondary | ICD-10-CM

## 2021-05-24 LAB — GLUCOSE, CAPILLARY
Glucose-Capillary: 83 mg/dL (ref 70–99)
Glucose-Capillary: 110 mg/dL — ABNORMAL HIGH (ref 70–99)
Glucose-Capillary: 56 mg/dL — ABNORMAL LOW (ref 70–99)
Glucose-Capillary: 233 mg/dL — ABNORMAL HIGH (ref 70–99)
Glucose-Capillary: 121 mg/dL — ABNORMAL HIGH (ref 70–99)

## 2021-05-24 NOTE — Progress Notes (Signed)
°   05/24/21 2300  Psych Admission Type (Psych Patients Only)  Admission Status Voluntary  Psychosocial Assessment  Patient Complaints None  Eye Contact Fair  Facial Expression Flat  Affect Appropriate to circumstance  Speech Logical/coherent  Interaction Forwards little  Motor Activity Other (Comment) (WDL)  Appearance/Hygiene Disheveled  Behavior Characteristics Appropriate to situation  Mood Pleasant;Depressed  Thought Process  Coherency WDL  Content WDL  Delusions None reported or observed  Perception WDL  Hallucination None reported or observed  Judgment Poor  Confusion None  Danger to Self  Current suicidal ideation? Denies  Danger to Others  Danger to Others None reported or observed

## 2021-05-24 NOTE — Plan of Care (Signed)
Nurse discussed depression, anxiety, coping skills for patient.

## 2021-05-24 NOTE — Progress Notes (Signed)
[facility] MD Progress Note   05/24/2021 3:56 PM Mitchell [name]  MRN:  [id number]  Chief Complaint: delusions and SI  Reason for Admission: Mitchell [name] is a 61 y.o. male with PPHx of Schizophrenia and multiple hospitalizations, who presented to [facility] via IVC for delusion that he is Mitchell [name] and Mitchell [name] with threats to jump into a trash compactor. He was then admitted Involuntary to [facility] for treatment of schizophrenia exacerbated by medication selective adherence.  Today's Note: The patient's chart was reviewed and nursing report and note also reviewed. The patient's case was discussed in multidisciplinary team meeting.  Patient was in group meeting earlier today and was seen in his bed awake and resting.  He engaged in Meaningful conversation regarding discharge plan.  Patient reported that he is waiting to hear from a group home that will be taking him in.  He eats well and sleeps well.  He attends some group activities but participation varies from shift to shift.  Patient denied SI/HI/AVH and no mention of Paranoia.  Blood Sugar dropped to 56 before lunch,  Orange juice was offered and blood sugar improved and was 110 before Lunch.  We will continue to monitor patient.   Principal Problem: Schizophrenia ([place]) Diagnosis: Principal Problem:   Schizophrenia ([place]) Active Problems:   Hypertension   Type 2 diabetes mellitus ([place])   Past Psychiatric History: see H&P  Past Medical History:  Past Medical History:  Diagnosis Date   Bipolar affective disorder ([place])    takes Zyprexa daily   Coarse tremors 10/02/2014   Diabetes mellitus    takes Victoza,Metformin,and Glipizide daily   Hypertension    takes Amlodipine,Lisinopril and Clonidine daily   Hyponatremia    history of   Lithium toxicity 10/02/2014   Mental disorder    takes Lithium daily   Schizoaffective disorder    takes Trazodone nightly   Schizoaffective disorder ([place]) 01/04/2019   Seasonal allergies     takes Claritin daily   Sleep apnea    sleep study >67yr ago   Stroke ([facility]    left arm weakness    Past Surgical History:  Procedure Laterality Date   CATARACT EXTRACTION W/PHACO Right 02/14/2013   Procedure: CATARACT EXTRACTION PHACO AND INTRAOCULAR LENS PLACEMENT ([place];  Surgeon: GAdonis Brook MD;  [place]  Service: Ophthalmology;  Laterality: Right;   CATARACT EXTRACTION W/PHACO Left 06/13/2013   Procedure: CATARACT EXTRACTION PHACO AND INTRAOCULAR LENS PLACEMENT (I[facility]);  Surgeon: GAdonis Brook MD;  [place]  Service: Ophthalmology;  Laterality: Left;   CIRCUMCISION  20 yrs. ago   EYE SURGERY     Family History:see H&P  Family Psychiatric History: unable to obtain  Social History:  Social History   Substance and Sexual Activity  Alcohol Use Not Currently     Social History   Substance and Sexual Activity  Drug Use No    Social History   Socioeconomic History   Marital status: Divorced    Spouse name: Not on file   Number of children: Not on file   Years of education: Not on file   Highest education level: Not on file  Occupational History   Not on file  Tobacco Use   Smoking status: Never   Smokeless tobacco: Never  Vaping Use   Vaping Use: Never used  Substance and Sexual Activity   Alcohol use: Not Currently   Drug use: No   Sexual activity: Yes    Birth  control/protection: None  Other Topics Concern   Not on file  Social History Narrative   Not on file   Social Determinants of Health   Financial Resource Strain: Not on file  Food Insecurity: Not on file  Transportation Needs: Not on file  Physical Activity: Not on file  Stress: Not on file  Social Connections: Not on file   Sleep: Good  Appetite: Good  Current Medications: Current Facility-Administered Medications  Medication Dose Route Frequency Provider Last Rate Last Admin   acetaminophen (TYLENOL) tablet 650 mg  650 mg Oral Q6H PRN Lucky Rathke, FNP       alum & mag  hydroxide-simeth (MAALOX/MYLANTA) 200-200-20 MG/5ML suspension 30 mL  30 mL Oral Q4H PRN Lucky Rathke, FNP       amLODipine (NORVASC) tablet 10 mg  10 mg Oral QHS Merrily Brittle, DO   10 mg at 05/23/21 1955   aspirin EC tablet 81 mg  81 mg Oral Daily Lucky Rathke, FNP   81 mg at 05/24/21 0810   atenolol (TENORMIN) tablet 12.5 mg  12.5 mg Oral Daily Lucky Rathke, FNP   12.5 mg at 05/24/21 4818   chewing gum (ORBIT) sugar free  1 Stick Oral TID PRN Merrily Brittle, DO       cloNIDine (CATAPRES) tablet 0.2 mg  0.2 mg Oral BID Lucky Rathke, FNP   0.2 mg at 05/24/21 5631   cloZAPine (CLOZARIL) tablet 75 mg  75 mg Oral QHS Merrily Brittle, DO   75 mg at 05/23/21 2129   donepezil (ARICEPT) tablet 5 mg  5 mg Oral QHS Merrily Brittle, DO   5 mg at 05/23/21 2129   haloperidol (HALDOL) tablet 15 mg  15 mg Oral QHS Harlow Asa, MD   15 mg at 05/23/21 1955   haloperidol (HALDOL) tablet 5 mg  5 mg Oral Daily Viann Fish E, MD   5 mg at 05/24/21 4970   insulin aspart (novoLOG) injection 0-5 Units  0-5 Units Subcutaneous QHS Harlow Asa, MD   2 Units at 05/20/21 2130   insulin aspart (novoLOG) injection 0-9 Units  0-9 Units Subcutaneous TID WC Harlow Asa, MD   2 Units at 05/23/21 1648   insulin aspart protamine- aspart (NOVOLOG MIX 70/30) injection 35 Units  35 Units Subcutaneous Q supper Merrily Brittle, DO   35 Units at 05/23/21 1648   insulin aspart protamine- aspart (NOVOLOG MIX 70/30) injection 52 Units  52 Units Subcutaneous Q breakfast Merrily Brittle, DO   52 Units at 05/24/21 0814   lisinopril (ZESTRIL) tablet 20 mg  20 mg Oral QHS Nelda Marseille, Amy E, MD   20 mg at 05/23/21 2129   lithium carbonate (ESKALITH) CR tablet 450 mg  450 mg Oral Q12H Lucky Rathke, FNP   450 mg at 05/24/21 0812   OLANZapine zydis (ZYPREXA) disintegrating tablet 10 mg  10 mg Oral Q8H PRN Corky Sox, MD       And   LORazepam (ATIVAN) tablet 1 mg  1 mg Oral PRN Corky Sox, MD       And   ziprasidone (GEODON)  injection 20 mg  20 mg Intramuscular PRN Corky Sox, MD       magnesium hydroxide (MILK OF MAGNESIA) suspension 30 mL  30 mL Oral Daily PRN Lucky Rathke, FNP       senna (SENOKOT) tablet 17.2 mg  2 tablet Oral Daily Merrily Brittle, DO   17.2 mg at 05/24/21  0811    Lab Results:  Results for orders placed or performed during the hospital encounter of 04/23/21 (from the past 48 hour(s))  Glucose, capillary     Status: Abnormal   Collection Time: 05/22/21  4:52 PM  Result Value Ref Range   Glucose-Capillary 246 (H) 70 - 99 mg/dL    Comment: Glucose reference range applies only to samples taken after fasting for at least 8 hours.  Glucose, capillary     Status: Abnormal   Collection Time: 05/22/21  8:16 PM  Result Value Ref Range   Glucose-Capillary 199 (H) 70 - 99 mg/dL    Comment: Glucose reference range applies only to samples taken after fasting for at least 8 hours.   Comment 1 Notify RN    Comment 2 Document in Chart   Glucose, capillary     Status: Abnormal   Collection Time: 05/23/21  6:10 AM  Result Value Ref Range   Glucose-Capillary 132 (H) 70 - 99 mg/dL    Comment: Glucose reference range applies only to samples taken after fasting for at least 8 hours.   Comment 1 Notify RN    Comment 2 Document in Chart   Glucose, capillary     Status: None   Collection Time: 05/23/21 11:43 AM  Result Value Ref Range   Glucose-Capillary 98 70 - 99 mg/dL    Comment: Glucose reference range applies only to samples taken after fasting for at least 8 hours.  Glucose, capillary     Status: Abnormal   Collection Time: 05/23/21  4:24 PM  Result Value Ref Range   Glucose-Capillary 181 (H) 70 - 99 mg/dL    Comment: Glucose reference range applies only to samples taken after fasting for at least 8 hours.  Glucose, capillary     Status: Abnormal   Collection Time: 05/23/21  7:54 PM  Result Value Ref Range   Glucose-Capillary 154 (H) 70 - 99 mg/dL    Comment: Glucose reference range applies  only to samples taken after fasting for at least 8 hours.  Glucose, capillary     Status: None   Collection Time: 05/24/21  6:04 AM  Result Value Ref Range   Glucose-Capillary 83 70 - 99 mg/dL    Comment: Glucose reference range applies only to samples taken after fasting for at least 8 hours.  Glucose, capillary     Status: Abnormal   Collection Time: 05/24/21 11:47 AM  Result Value Ref Range   Glucose-Capillary 56 (L) 70 - 99 mg/dL    Comment: Glucose reference range applies only to samples taken after fasting for at least 8 hours.  Glucose, capillary     Status: Abnormal   Collection Time: 05/24/21 12:03 PM  Result Value Ref Range   Glucose-Capillary 110 (H) 70 - 99 mg/dL    Comment: Glucose reference range applies only to samples taken after fasting for at least 8 hours.    Blood Alcohol level:  Lab Results  Component Value Date   ETH <10 04/16/2021   ETH <10 90/24/0973    Metabolic Disorder Labs: Lab Results  Component Value Date   HGBA1C 9.2 (H) 04/24/2021   MPG 217.34 04/24/2021   MPG 271.87 01/26/2021   No results found for: PROLACTIN Lab Results  Component Value Date   CHOL 140 01/30/2021   TRIG 95 01/30/2021   HDL 49 01/30/2021   CHOLHDL 2.9 01/30/2021   VLDL 19 01/30/2021   LDLCALC 72 01/30/2021   LDLCALC 67 05/20/2020  Psychiatric Specialty Exam: Physical Exam Vitals and nursing note reviewed.  Constitutional:      Appearance: He is obese. He is not diaphoretic.  HENT:     Head: Normocephalic.  Eyes:     Extraocular Movements: Extraocular movements intact.  Pulmonary:     Effort: Pulmonary effort is normal. No respiratory distress.  Neurological:     General: No focal deficit present.     Mental Status: He is alert.  Psychiatric:        Behavior: Behavior is cooperative.     Review of Systems  Respiratory:  Negative for shortness of breath.   Cardiovascular:  Negative for chest pain.  Gastrointestinal:  Negative for nausea and vomiting.   Neurological:  Negative for dizziness and headaches.    Body mass index is 37.42 kg/m. Temp:  [97.8 F (36.6 C)-98.1 F (36.7 C)] 97.8 F (36.6 C) (02/26 0630) Pulse Rate:  [73-81] 81 (02/26 0631) BP: (108-131)/(67-84) 108/67 (02/26 0631) SpO2:  [96 %-100 %] 100 % (02/26 0631)  General Appearance:  Disheveled appearing today wearing dirty top with uncombed hair, no longer malodorous    Eye Contact:   Minimal    Speech:   Rambling and mumbling quality, answers abruptly with short responses to direct questions - nonspontaneous    Volume:   Decreased    Mood:   Described as "good" - appears aloof and ambivalent    Affect:   Constricted     Thought Process:  Concrete but linear with overall poverty of thought  Duration of Psychotic Symptoms:  Greater than six months  Past Diagnosis of Schizophrenia or Psychoactive disorder:  Yes   Orientation:   Oriented to month, year and city and knows he is in a psychiatric hospital but not oriented to person or situation   Thought Content:   Has residual fixed delusion that he is "[name]"; he denies AVH, paranoia, ideas of reference or first rank symptoms.  He is not grossly responding to internal or external stimuli on exam.  He denies SI or HI.   Hallucinations: Denied  Ideas of Reference: Delusions; Paranoia   Suicidal Thoughts:   No   Homicidal Thoughts:   No    Memory:   Immediate - fair; recent and remote poor    Judgement:   Poor    Insight:   Poor    Psychomotor Activity:   no tremor noted, no akathisias, steady gait    Concentration:   Fair    Attention Span:   Fair   Recall:   Poor    Fund of Knowledge:   Limited    Language:   Fair    Assets:   Leisure Time    Sleep:   7.5 hours  AIMS: Facial and Oral Movements Muscles of Facial Expression: None, normal Lips and Perioral Area: None, normal Jaw: None, normal Tongue: None, normal,Extremity Movements Upper (arms, wrists, hands,  fingers): None, normal Lower (legs, knees, ankles, toes): None, normal, Trunk Movements Neck, shoulders, hips: None, normal, Overall Severity Severity of abnormal movements (highest score from questions above): None, normal Incapacitation due to abnormal movements: None, normal Patient's awareness of abnormal movements (rate only patient's report): No Awareness, Dental Status Current problems with teeth and/or dentures?: No Does patient usually wear dentures?: No   AIMS 0 (2/13 and on 2/16) MOCA: 13/30  MMSE: 14/30 Pillbox test: Failed    ASSESSMENT: Diagnoses / Active Problems: Schizophrenia by hx Type II DM HTN Hyponatremia Elevated Creatinine High  risk medication use Elevated transaminases Anemia Cognitive impairment (r/o major neurocognitive d/o)  PLAN: Safety and Monitoring:  -- Voluntary admission to inpatient psychiatric unit for safety, stabilization and treatment  -- Daily contact with patient to assess and evaluate symptoms and progress in treatment  -- Patient's case to be discussed in multi-disciplinary team meeting  -- Observation Level : q15 minute checks  -- Vital signs:  q12 hours  -- Precautions: suicide, elopement, and assault  2. Psychiatric Diagnoses and Treatment:   Schizophrenia by hx -Continued Haldol 5 mg qAM & 15 mg qPM for psychosis -Continued Clozaril 75 mg nightly for psychosis - WBC 6.2 , ANC 3300, Troponin 2, CRP 2.1 (2/17) - repeat labs due 2/24 - Clozaril level 187, NorClozapine 87, and total Cloz + Nor-cloz level 273 (2/5) - QTC 460m on (2/7) - QTC 4218m(2/18) - A1c 9.2 (1/27) and lipids WNL in 11/22 - Continue senokot 2 tabs daily for Clozaril-induced constipation  -Continue Lithium 450 mg BID for mood stability - Li level 0.89 (2/17) - Cr (!) 1.27 (2/17)- trending while on Lithium and po hydration encouraged  - TSH 2.233 (1/31) - ACTT referral made and patient now willing to consider ALF with FL2 signed  Cognitive impairment (r/o  major neurocognitive d/o) MOCA: 13/30  MMSE: 14/30 Pillbox test: Failed (per OT patient is not able to manage his own medication. Patient will need 24/7 caregiver support to assist with IADLs and medication management to be successful)  - Ongoing concern for ability to live independently and to complete IADLs and manage his insulin regimen and medications- He shows no insight or understanding into his complex medication regimen, the risks associated with not being medication compliant, nor his medical or psychiatric diagnoses.. APS report accepted and guardianship recommended by medical providers - Follow-up on patient guardianship recommendations - Patient now receptive to ALF and FL2 completed - Continued Aricept 5 mg qHS for poor memory    3. Medical Issues Being Addressed:   T2DM with insulin dependence - A1C of 9.2. Diabetic coordinator on-board for insulin management -Continued insulin per diabetic coordinator with 70/30 52u with breakfast and continue 70/30 35u with supper  - Changed SSI to sensitive coverage per recommendations of diabetic coordinator - Continued HS coverage -Placed on unit restriction for meals to monitor portions and carb choices   HTN -Lisinopril 2076maily (reduced due to concern for creatinine) -Amlodipine 10 mg daily -Clonidine 0.2 mg BID -Atenolol 12.5mg71mily  Elevated transaminases, down trending - AST 76 and ALT 94 (2/17); Alk phos 161 (2/17) - Med consulted, stated that no further work up necessary - Hepatitis panel nonreactive, ammonia 35, negative RUQ u/s 02/10/21 - Will need outpatient f/u after discharge  Mild Hyponatremia - Na+ 132  on 2/17 - continue to trend  Elevated Creatinine - 1.27 (2/17) - encouraging po hydration and continue to trend  Anemia  - Repeat H/H up to 12.1/36.3 - Ferritin 80, Iron/TIBC WNL, Folate 10.5, B12 366 - F/u with PCP after discharge  4. Discharge Planning:   -- Social work and case management to assist with  discharge planning and identification of hospital follow-up needs prior to discharge  -- Discharge Concerns: Need to establish safe housing option given his need for assistance with ADLs and medications - need for guardianship and potential ALF/group home referral; ACTT referral  -- Discharge Goals: Return home with outpatient referrals for mental health follow-up including medication management/psychotherapy   JoseDelfin Gant-PMHNP-BC

## 2021-05-24 NOTE — Progress Notes (Addendum)
D:  Patient denied SI and HI, contracts for safety.  Denied A/V hallucinations.  Denied pain. A:  Medications administered per MD orders.  Emotional support and encouragement given patient.   R: Safety maintained with 25 minute checks.

## 2021-05-24 NOTE — Progress Notes (Signed)
Patient's self inventory sheet, patient sleeps good, sleep medication helpful.  Good appetite, low energy level, good concentration.  Rated depression 10, denied hopeless and anxiety.  Denied withdrawals.  Denied SI.  Denied physical pain.  Goal is discharge and feel better.  Plans to attend groups.  No discharge plan at this time.

## 2021-05-24 NOTE — [facility] Group Notes (Signed)
Adult Psychoeducational Group Not Date:  05/24/2021 Time:  4010-2725 Group Topic/Focus: PROGRESSIVE RELAXATION. A group where deep breathing is taught and tensing and relaxation muscle groups is used. Imagery is used as well.  Pts are asked to imagine 3 pillars that hold them up when they are not able to hold themselves up.  Participation Level:  Active  Participation Quality:  Appropriate  Affect:  Appropriate  Cognitive:  Oriented  Insight: Improving  Engagement in Group:  Engaged  Modes of Intervention:  Activity, Discussion, Education, and Support  Additional Comments:  Pt rates his energy at a 10/10. Fell asleep.  Dione Housekeeper

## 2021-05-24 NOTE — [facility] Group Notes (Signed)
Adult Psychoeducational Group Not Date:  05/24/2021 Time:  6606-3016 Group Topic/Focus: PROGRESSIVE RELAXATION. A group where deep breathing is taught and tensing and relaxation muscle groups is used. Imagery is used as well.  Pts are asked to imagine 3 pillars that hold them up when they are not able to hold themselves up.  Participation Level:  Active  Participation Quality:  Appropriate  Affect:  Appropriate  Cognitive:  Oriented  Insight: Improving  Engagement in Group:  Engaged  Modes of Intervention:  Activity, Discussion, Education, and Support  Additional Comments:  Pt rates his energy at a 10/10. States that his pillars are, God, his self esteem and his children whom he loves./  Dione Housekeeper

## 2021-05-24 NOTE — Group Note (Signed)
LCSW Group Therapy Note ° ° °Group Date: 05/24/2021 °Start Time: 1000 °End Time: 1100 ° ° °Type of Therapy and Topic:  Group Therapy:  ° °LCSW Group Therapy °  °  °Due to high patient acuity and staffing, group was unable to be held on 05/24/2021. The CSW supervisor as well as the on-duty AC was made aware. ° ° ° °Keiandre Cygan A Arryn Terrones, LCSW °05/24/2021  12:30 PM   ° °

## 2021-05-24 NOTE — Progress Notes (Signed)
Hypoglycemic Event  CBG: 56  Treatment: 8 oz juice/soda  Symptoms: Shaky  Follow-up CBG: Time:1200 CBG Result: 110 Possible Reasons for Event: Unknown  Comments/MD notified:    Shela Nevin

## 2021-05-25 LAB — GLUCOSE, CAPILLARY
Glucose-Capillary: 101 mg/dL — ABNORMAL HIGH (ref 70–99)
Glucose-Capillary: 69 mg/dL — ABNORMAL LOW (ref 70–99)
Glucose-Capillary: 286 mg/dL — ABNORMAL HIGH (ref 70–99)
Glucose-Capillary: 238 mg/dL — ABNORMAL HIGH (ref 70–99)
Glucose-Capillary: 201 mg/dL — ABNORMAL HIGH (ref 70–99)

## 2021-05-25 NOTE — Progress Notes (Signed)
Pt had incidence of low blood sugar this morning.  It was 69 at 600 and at 620 went up to 101.  Pt did not get morning dose of 70/30 insulin this morning at breakfast.  MD aware.    Pt is pleasant and cooperative.  Denies SI/HI/AVH.

## 2021-05-25 NOTE — Progress Notes (Signed)
[facility] MD Progress Note   05/25/2021 5:17 PM Mitchell [name]  MRN:  [id number]  Chief Complaint: delusions and SI  Reason for Admission: Mitchell [name] is a 61 y.o. male with PPHx of Schizophrenia and multiple hospitalizations, who presented to [facility] via IVC for delusion that he is Mitchell [name] and [name] with threats to jump into a trash compactor. He was then admitted Involuntary to [facility] for treatment of schizophrenia exacerbated by medication selective adherence.  Today's Note: The patient's chart was reviewed and nursing report and note also reviewed. The patient's case was discussed in multidisciplinary team meeting.  Patient was seen this morning in his room and again in the common area in the afternoon. He was in good spirits. He feels "just fine" today. No complaints of pain or side effects of medications. He is unsure if he will go out with the group for exercise today. He denies hallucinations, thoughts of harm to self or others.   Blood sugars have been dipping early in the morning. I think he might be missing the late snack, and going to bed early.    Principal Problem: Schizophrenia ([place]) Diagnosis: Principal Problem:   Schizophrenia ([place]) Active Problems:   Hypertension   Type 2 diabetes mellitus ([place])   Past Psychiatric History: see H&P  Past Medical History:  Past Medical History:  Diagnosis Date   Bipolar affective disorder ([place])    takes Zyprexa daily   Coarse tremors 10/02/2014   Diabetes mellitus    takes Victoza,Metformin,and Glipizide daily   Hypertension    takes Amlodipine,Lisinopril and Clonidine daily   Hyponatremia    history of   Lithium toxicity 10/02/2014   Mental disorder    takes Lithium daily   Schizoaffective disorder    takes Trazodone nightly   Schizoaffective disorder ([place]) 01/04/2019   Seasonal allergies    takes Claritin daily   Sleep apnea    sleep study >58yr ago   Stroke ([facility]    left arm weakness    Past  Surgical History:  Procedure Laterality Date   CATARACT EXTRACTION W/PHACO Right 02/14/2013   Procedure: CATARACT EXTRACTION PHACO AND INTRAOCULAR LENS PLACEMENT ([place];  Surgeon: GAdonis Brook MD;  Location: [facility]  Service: Ophthalmology;  Laterality: Right;   CATARACT EXTRACTION W/PHACO Left 06/13/2013   Procedure: CATARACT EXTRACTION PHACO AND INTRAOCULAR LENS PLACEMENT (IOC);  Surgeon: GAdonis Brook MD;  [place]  Service: Ophthalmology;  Laterality: Left;   CIRCUMCISION  20 yrs. ago   EYE SURGERY     Family History:see H&P  Family Psychiatric History: unable to obtain  Social History:  Social History   Substance and Sexual Activity  Alcohol Use Not Currently     Social History   Substance and Sexual Activity  Drug Use No    Social History   Socioeconomic History   Marital status: Divorced    Spouse name: Not on file   Number of children: Not on file   Years of education: Not on file   Highest education level: Not on file  Occupational History   Not on file  Tobacco Use   Smoking status: Never   Smokeless tobacco: Never  Vaping Use   Vaping Use: Never used  Substance and Sexual Activity   Alcohol use: Not Currently   Drug use: No   Sexual activity: Yes    Birth control/protection: None  Other Topics Concern   Not on file  Social History Narrative  Not on file   Social Determinants of Health   Financial Resource Strain: Not on file  Food Insecurity: Not on file  Transportation Needs: Not on file  Physical Activity: Not on file  Stress: Not on file  Social Connections: Not on file   Sleep: Good  Appetite: Good  Current Medications: Current Facility-Administered Medications  Medication Dose Route Frequency Provider Last Rate Last Admin   acetaminophen (TYLENOL) tablet 650 mg  650 mg Oral Q6H PRN Lucky Rathke, FNP   650 mg at 05/25/21 1443   alum & mag hydroxide-simeth (MAALOX/MYLANTA) 200-200-20 MG/5ML suspension 30 mL  30 mL Oral Q4H PRN  Lucky Rathke, FNP       amLODipine (NORVASC) tablet 10 mg  10 mg Oral QHS Merrily Brittle, DO   10 mg at 05/24/21 1959   aspirin EC tablet 81 mg  81 mg Oral Daily Lucky Rathke, FNP   81 mg at 05/25/21 0758   atenolol (TENORMIN) tablet 12.5 mg  12.5 mg Oral Daily Lucky Rathke, FNP   12.5 mg at 05/25/21 0758   chewing gum (ORBIT) sugar free  1 Stick Oral TID PRN Merrily Brittle, DO       cloNIDine (CATAPRES) tablet 0.2 mg  0.2 mg Oral BID Lucky Rathke, FNP   0.2 mg at 05/25/21 1708   cloZAPine (CLOZARIL) tablet 75 mg  75 mg Oral QHS Merrily Brittle, DO   75 mg at 05/24/21 2136   donepezil (ARICEPT) tablet 5 mg  5 mg Oral QHS Merrily Brittle, DO   5 mg at 05/24/21 2136   haloperidol (HALDOL) tablet 15 mg  15 mg Oral QHS Harlow Asa, MD   15 mg at 05/24/21 1958   haloperidol (HALDOL) tablet 5 mg  5 mg Oral Daily Nelda Marseille, Amy E, MD   5 mg at 05/25/21 0758   insulin aspart (novoLOG) injection 0-5 Units  0-5 Units Subcutaneous QHS Harlow Asa, MD   2 Units at 05/20/21 2130   insulin aspart (novoLOG) injection 0-9 Units  0-9 Units Subcutaneous TID WC Harlow Asa, MD   3 Units at 05/25/21 1708   insulin aspart protamine- aspart (NOVOLOG MIX 70/30) injection 35 Units  35 Units Subcutaneous Q supper Merrily Brittle, DO   35 Units at 05/25/21 1713   insulin aspart protamine- aspart (NOVOLOG MIX 70/30) injection 52 Units  52 Units Subcutaneous Q breakfast Merrily Brittle, DO   52 Units at 05/24/21 0814   lisinopril (ZESTRIL) tablet 20 mg  20 mg Oral QHS Nelda Marseille, Amy E, MD   20 mg at 05/24/21 2136   lithium carbonate (ESKALITH) CR tablet 450 mg  450 mg Oral Q12H Lucky Rathke, FNP   450 mg at 05/25/21 0759   OLANZapine zydis (ZYPREXA) disintegrating tablet 10 mg  10 mg Oral Q8H PRN Corky Sox, MD       And   LORazepam (ATIVAN) tablet 1 mg  1 mg Oral PRN Corky Sox, MD       And   ziprasidone (GEODON) injection 20 mg  20 mg Intramuscular PRN Corky Sox, MD       magnesium hydroxide  (MILK OF MAGNESIA) suspension 30 mL  30 mL Oral Daily PRN Lucky Rathke, FNP       senna (SENOKOT) tablet 17.2 mg  2 tablet Oral Daily Merrily Brittle, DO   17.2 mg at 05/25/21 0354    Lab Results:  Results for orders placed or performed during  the hospital encounter of 04/23/21 (from the past 48 hour(s))  Glucose, capillary     Status: Abnormal   Collection Time: 05/23/21  7:54 PM  Result Value Ref Range   Glucose-Capillary 154 (H) 70 - 99 mg/dL    Comment: Glucose reference range applies only to samples taken after fasting for at least 8 hours.  Glucose, capillary     Status: None   Collection Time: 05/24/21  6:04 AM  Result Value Ref Range   Glucose-Capillary 83 70 - 99 mg/dL    Comment: Glucose reference range applies only to samples taken after fasting for at least 8 hours.  Glucose, capillary     Status: Abnormal   Collection Time: 05/24/21 11:47 AM  Result Value Ref Range   Glucose-Capillary 56 (L) 70 - 99 mg/dL    Comment: Glucose reference range applies only to samples taken after fasting for at least 8 hours.  Glucose, capillary     Status: Abnormal   Collection Time: 05/24/21 12:03 PM  Result Value Ref Range   Glucose-Capillary 110 (H) 70 - 99 mg/dL    Comment: Glucose reference range applies only to samples taken after fasting for at least 8 hours.  Glucose, capillary     Status: Abnormal   Collection Time: 05/24/21  5:04 PM  Result Value Ref Range   Glucose-Capillary 233 (H) 70 - 99 mg/dL    Comment: Glucose reference range applies only to samples taken after fasting for at least 8 hours.  Glucose, capillary     Status: Abnormal   Collection Time: 05/24/21  7:54 PM  Result Value Ref Range   Glucose-Capillary 121 (H) 70 - 99 mg/dL    Comment: Glucose reference range applies only to samples taken after fasting for at least 8 hours.  Glucose, capillary     Status: Abnormal   Collection Time: 05/25/21  6:04 AM  Result Value Ref Range   Glucose-Capillary 69 (L) 70 - 99  mg/dL    Comment: Glucose reference range applies only to samples taken after fasting for at least 8 hours.  Glucose, capillary     Status: Abnormal   Collection Time: 05/25/21  6:22 AM  Result Value Ref Range   Glucose-Capillary 101 (H) 70 - 99 mg/dL    Comment: Glucose reference range applies only to samples taken after fasting for at least 8 hours.  Glucose, capillary     Status: Abnormal   Collection Time: 05/25/21 11:27 AM  Result Value Ref Range   Glucose-Capillary 286 (H) 70 - 99 mg/dL    Comment: Glucose reference range applies only to samples taken after fasting for at least 8 hours.  Glucose, capillary     Status: Abnormal   Collection Time: 05/25/21  4:35 PM  Result Value Ref Range   Glucose-Capillary 238 (H) 70 - 99 mg/dL    Comment: Glucose reference range applies only to samples taken after fasting for at least 8 hours.    Blood Alcohol level:  Lab Results  Component Value Date   ETH <10 04/16/2021   ETH <10 98/33/8250    Metabolic Disorder Labs: Lab Results  Component Value Date   HGBA1C 9.2 (H) 04/24/2021   MPG 217.34 04/24/2021   MPG 271.87 01/26/2021   No results found for: PROLACTIN Lab Results  Component Value Date   CHOL 140 01/30/2021   TRIG 95 01/30/2021   HDL 49 01/30/2021   CHOLHDL 2.9 01/30/2021   VLDL 19 01/30/2021   LDLCALC 72  01/30/2021   [place] 67 05/20/2020   Psychiatric Specialty Exam: Physical Exam Vitals and nursing note reviewed.  Constitutional:      Appearance: He is obese. He is not diaphoretic.  HENT:     Head: Normocephalic.  Eyes:     Extraocular Movements: Extraocular movements intact.  Pulmonary:     Effort: Pulmonary effort is normal. No respiratory distress.  Neurological:     General: No focal deficit present.     Mental Status: He is alert.  Psychiatric:        Behavior: Behavior is cooperative.     Review of Systems  Constitutional:  Negative for fever.  Respiratory:  Negative for shortness of breath.    Cardiovascular:  Negative for chest pain.  Gastrointestinal:  Negative for nausea and vomiting.  Neurological:  Negative for dizziness and headaches.    Body mass index is 37.42 kg/m. Temp:  [97.8 F (36.6 C)-98 F (36.7 C)] 98 F (36.7 C) (02/27 1711) Pulse Rate:  [75-91] 80 (02/27 1711) Resp:  [20] 20 (02/26 2001) BP: (124-141)/(73-81) 141/81 (02/27 1711) SpO2:  [96 %-100 %] 100 % (02/27 1711)  General Appearance:  Disheveled appearing today wearing dirty top with uncombed hair, no longer malodorous    Eye Contact:   Minimal    Speech:   Rambling and mumbling quality, answers abruptly with short responses to direct questions - nonspontaneous    Volume:   Decreased    Mood:   Described as "good" - appears aloof and ambivalent    Affect:   Constricted     Thought Process:  Concrete but linear with overall poverty of thought  Duration of Psychotic Symptoms:  Greater than six months  Past Diagnosis of Schizophrenia or Psychoactive disorder:  Yes   Orientation:   Oriented to month, year and city and knows he is in a psychiatric hospital but not oriented to person or situation   Thought Content:   Has residual fixed delusion that he is "[name]"; he denies AVH, paranoia, ideas of reference or first rank symptoms.  He is not grossly responding to internal or external stimuli on exam.  He denies SI or HI.   Hallucinations: Denied  Ideas of Reference: Delusions; Paranoia   Suicidal Thoughts:   No   Homicidal Thoughts:   No    Memory:   Immediate - fair; recent and remote poor    Judgement:   Poor    Insight:   Poor    Psychomotor Activity:   no tremor noted, no akathisias, steady gait    Concentration:   Fair    Attention Span:   Fair   Recall:   Poor    Fund of Knowledge:   Limited    Language:   Fair    Assets:   Leisure Time    Sleep:   7.5 hours  AIMS: Facial and Oral Movements Muscles of Facial Expression: None,  normal Lips and Perioral Area: None, normal Jaw: None, normal Tongue: None, normal,Extremity Movements Upper (arms, wrists, hands, fingers): None, normal Lower (legs, knees, ankles, toes): None, normal, Trunk Movements Neck, shoulders, hips: None, normal, Overall Severity Severity of abnormal movements (highest score from questions above): None, normal Incapacitation due to abnormal movements: None, normal Patient's awareness of abnormal movements (rate only patient's report): No Awareness, Dental Status Current problems with teeth and/or dentures?: No Does patient usually wear dentures?: No   AIMS 0 (2/13 and on 2/16) MOCA: 13/30  MMSE: 14/30 Pillbox test:  Failed    ASSESSMENT: Diagnoses / Active Problems: Schizophrenia by hx Type II DM HTN Hyponatremia Elevated Creatinine High risk medication use Elevated transaminases Anemia Cognitive impairment (r/o major neurocognitive d/o)  PLAN: Safety and Monitoring:  -- Voluntary admission to inpatient psychiatric unit for safety, stabilization and treatment  -- Daily contact with patient to assess and evaluate symptoms and progress in treatment  -- Patient's case to be discussed in multi-disciplinary team meeting  -- Observation Level : q15 minute checks  -- Vital signs:  q12 hours  -- Precautions: suicide, elopement, and assault  2. Psychiatric Diagnoses and Treatment:   Schizophrenia by hx -Continued Haldol 5 mg qAM & 15 mg qPM for psychosis -Continued Clozaril 75 mg nightly for psychosis - WBC 6.2 , ANC 3300, Troponin 2, CRP 2.1 (2/17) - repeat labs due 2/24 - Clozaril level 187, NorClozapine 87, and total Cloz + Nor-cloz level 273 (2/5) - QTC 454m on (2/7) - QTC 4222m(2/18) - A1c 9.2 (1/27) and lipids WNL in 11/22 - Continue senokot 2 tabs daily for Clozaril-induced constipation  -Continue Lithium 450 mg BID for mood stability - Li level 0.89 (2/17) - Cr (!) 1.27 (2/17)- trending while on Lithium and po hydration  encouraged  - TSH 2.233 (1/31) - ACTT referral made and patient now willing to consider ALF with FL2 signed  Cognitive impairment (r/o major neurocognitive d/o) MOCA: 13/30  MMSE: 14/30 Pillbox test: Failed (per OT patient is not able to manage his own medication. Patient will need 24/7 caregiver support to assist with IADLs and medication management to be successful)  - Ongoing concern for ability to live independently and to complete IADLs and manage his insulin regimen and medications- He shows no insight or understanding into his complex medication regimen, the risks associated with not being medication compliant, nor his medical or psychiatric diagnoses.. APS report accepted and guardianship recommended by medical providers - Follow-up on patient guardianship recommendations - Patient now receptive to ALF and FL2 completed - Continued Aricept 5 mg qHS for poor memory    3. Medical Issues Being Addressed:   T2DM with insulin dependence - A1C of 9.2. Diabetic coordinator on-board for insulin management -Continued insulin per diabetic coordinator with 70/30 52u with breakfast and continue 70/30 35u with supper  - Changed SSI to sensitive coverage per recommendations of diabetic coordinator - Continued HS coverage -Placed on unit restriction for meals to monitor portions and carb choices   HTN -Lisinopril 2058maily (reduced due to concern for creatinine) -Amlodipine 10 mg daily -Clonidine 0.2 mg BID -Atenolol 12.5mg32mily  Elevated transaminases, down trending - AST 76 and ALT 94 (2/17); Alk phos 161 (2/17) - Med consulted, stated that no further work up necessary - Hepatitis panel nonreactive, ammonia 35, negative RUQ u/s 02/10/21 - Will need outpatient f/u after discharge  Mild Hyponatremia - Na+ 132  on 2/17 - continue to trend  Elevated Creatinine - 1.27 (2/17) - encouraging po hydration and continue to trend  Anemia  - Repeat H/H up to 12.1/36.3 - Ferritin 80,  Iron/TIBC WNL, Folate 10.5, B12 366 - F/u with PCP after discharge  4. Discharge Planning:   -- Social work and case management to assist with discharge planning and identification of hospital follow-up needs prior to discharge  -- Discharge Concerns: Need to establish safe housing option given his need for assistance with ADLs and medications - need for guardianship and potential ALF/group home referral; ACTT referral  -- Discharge Goals: Return home with  outpatient referrals for mental health follow-up including medication management/psychotherapy   Maida Sale, MD

## 2021-05-25 NOTE — Progress Notes (Signed)
The patient attended the evening A.A.meeting and was appropriate.  

## 2021-05-25 NOTE — BHH Counselor (Signed)
CSW spoke with Seward Grater [phone]) with [facility] program. She is waiting to hear back from their clinical staff in order to set up a meeting with this patient and his providers in order for him to transition into the family care home setting on Wednesday.     Ruthann Cancer MSW, LCSW Clincal Social Worker  [facility]

## 2021-05-25 NOTE — BHH Group Notes (Signed)
Orientation Group and Psychoeducational teaching. Patients were asked to share how they are feeling mentally today, and unit and ward rules, expectations were discussed. Psychoeducational teaching was then discussed with habits of mindfulness and positive reframing techniques to improve mental health. The patients were given a poem of the Dalia Lama and asked one technique to use to help calm anxiety and negative thoughts. The patient attended group.  °

## 2021-05-25 NOTE — BHH Counselor (Signed)
CSW spoke with the clinical director of [employer] who shared that this patients transition meeting is scheduled for Wednesday at 4pm. She reports that she will drop off his insurance cards, ID, SSN and EBT card on Wednesday morning before he is discharged from the hospital.     Ruthann Cancer MSW, LCSW Clincal Social Worker  [facility]

## 2021-05-25 NOTE — Group Note (Signed)
LCSW Group Therapy Note  Group Date: 05/25/2021 Start Time: 1300 End Time: 1400  Type of Therapy and Topic:  Group Therapy: Positive Affirmations   Participation Level:  Active     Description of Group:   This group addressed positive affirmation towards self and others.  Patients went around the room and identified two positive things about themselves and two positive things about a peer in the room.  Patients reflected on how it felt to share something positive with others, to identify positive things about themselves, and to hear positive things from others/ Patients were encouraged to have a daily reflection of positive characteristics or circumstances.    Therapeutic Goals: Patients will verbalize two of their positive qualities Patients will demonstrate empathy for others by stating two positive qualities about a peer in the group Patients will verbalize their feelings when voicing positive self affirmations and when voicing positive affirmations of others Patients will discuss the potential positive impact on their wellness/recovery of focusing on positive traits of self and others.   Summary of Patient Progress:  Pt was actively engaged in the discussion and  was able to identify positive affirmations about themself as well as other group members. Patient demonstrated insight into the subject matter, was respectful of peers, and participated throughout the entire session.  Pt also accepted the worksheets that were provided and followed along with those worksheets.     Therapeutic Modalities:   Cognitive Behavioral Therapy Motivational Interviewing   Aram Beecham, [place] 05/25/2021  1:57 PM

## 2021-05-25 NOTE — Progress Notes (Signed)
°   05/25/21 2000  Psych Admission Type (Psych Patients Only)  Admission Status Voluntary  Psychosocial Assessment  Patient Complaints None  Eye Contact Fair  Facial Expression Animated  Affect Appropriate to circumstance  Speech Logical/coherent  Interaction Assertive  Motor Activity Slow;Pacing  Appearance/Hygiene Unremarkable  Behavior Characteristics Appropriate to situation;Cooperative  Mood Pleasant  Thought Process  Coherency WDL  Content WDL  Delusions None reported or observed  Perception WDL  Hallucination None reported or observed  Judgment Poor  Confusion Moderate  Danger to Self  Current suicidal ideation? Denies  Danger to Others  Danger to Others None reported or observed   Mitchell [name] was up and pacing on the unit.  He did attend evening AA group.  He voiced no concerns or questions at this time.  HS blood sugar was 201-2 units coverage given per order.  He reported that he ate pineapple and potato chips as a snack this evening.  He denied SI/HI or AVH.  Q 15 minute checks maintained for safety.

## 2021-05-25 NOTE — Group Note (Signed)
Recreation Therapy Group Note   Group Topic:Stress Management  Group Date: 05/25/2021 Start Time: 0930 End Time: 0950 Facilitators: Caroll Rancher, [place] Location: [address]   Goal Area(s) Addresses:  Patient will identify positive stress management techniques. Patient will identify benefits of using stress management post d/c.  Group Description:  Stress Release.  LRT played a meditation that focused on releasing stress through breathing and muscle tension and release.  Patients were to follow along as meditation played and go through the steps as the speaker was leading them through.  LRT and patients also discussed places like apps and Youtube  to access other stress management techniques.   Affect/Mood: Appropriate   Participation Level: Active   Participation Quality: Independent   Behavior: Appropriate   Speech/Thought Process: Focused   Insight: Good   Judgement: Good   Modes of Intervention: Meditation   Patient Response to Interventions:  Attentive   Education Outcome:  Acknowledges education and In group clarification offered    Clinical Observations/Individualized Feedback: Pt attended and participated in group.    Plan: Continue to engage patient in RT group sessions 2-3x/week.   Caroll Rancher, LRT,CTRS 05/25/2021 1:20 PM

## 2021-05-26 ENCOUNTER — Ambulatory Visit (HOSPITAL_COMMUNITY)
Admission: RE | Admit: 2021-05-26 | Discharge: 2021-05-26 | Disposition: A | Discharge status: Home / Self Care | Payer: Medicare Other | Attending: Emergency Medicine | Admitting: Emergency Medicine

## 2021-05-26 DIAGNOSIS — Z111 Encounter for screening for respiratory tuberculosis: Secondary | ICD-10-CM | POA: Insufficient documentation

## 2021-05-26 LAB — RESP PANEL BY RT-PCR (FLU A&B, COVID) ARPGX2
Influenza A by PCR: NEGATIVE
Influenza B by PCR: NEGATIVE
SARS Coronavirus 2 by RT PCR: NEGATIVE

## 2021-05-26 LAB — QUANTIFERON-TB GOLD PLUS: QuantiFERON-TB Gold Plus: NEGATIVE

## 2021-05-26 LAB — BASIC METABOLIC PANEL
Anion gap: 8 (ref 5–15)
BUN: 18 mg/dL (ref 6–20)
CO2: 24 mmol/L (ref 22–32)
Calcium: 8.7 mg/dL — ABNORMAL LOW (ref 8.9–10.3)
Chloride: 103 mmol/L (ref 98–111)
Creatinine, Ser: 1.59 mg/dL — ABNORMAL HIGH (ref 0.61–1.24)
GFR, Estimated: 49 mL/min — ABNORMAL LOW (ref >60)
Glucose, Bld: 234 mg/dL — ABNORMAL HIGH (ref 70–99)
Potassium: 4.5 mmol/L (ref 3.5–5.1)
Sodium: 135 mmol/L (ref 135–145)

## 2021-05-26 LAB — QUANTIFERON-TB GOLD PLUS (RQFGPL)
QuantiFERON Mitogen Value: >10 IU/mL
QuantiFERON Nil Value: 0.2 IU/mL
QuantiFERON TB1 Ag Value: 0.13 IU/mL
QuantiFERON TB2 Ag Value: 0.13 IU/mL

## 2021-05-26 LAB — GLUCOSE, CAPILLARY
Glucose-Capillary: 102 mg/dL — ABNORMAL HIGH (ref 70–99)
Glucose-Capillary: 174 mg/dL — ABNORMAL HIGH (ref 70–99)
Glucose-Capillary: 200 mg/dL — ABNORMAL HIGH (ref 70–99)
Glucose-Capillary: 195 mg/dL — ABNORMAL HIGH (ref 70–99)

## 2021-05-26 MED ORDER — INSULIN ASPART PROT & ASPART (70-30 MIX) 100 UNIT/ML ~~LOC~~ SUSP
30.0000 [IU] | Freq: Every day | SUBCUTANEOUS | Status: DC
  Administered 2021-05-26: 30 [IU] via SUBCUTANEOUS

## 2021-05-26 MED ORDER — INSULIN ASPART PROT & ASPART (70-30 MIX) 100 UNIT/ML ~~LOC~~ SUSP
30.0000 [IU] | Freq: Every day | SUBCUTANEOUS | Status: DC
  Administered 2021-05-27: 30 [IU] via SUBCUTANEOUS

## 2021-05-26 NOTE — BHH Counselor (Signed)
Patient has been accepted to [facility] and can arrive 05/27/2021 between 12-1pm.  Address: [address] [place], [place] [address].      Ruthann Cancer MSW, LCSW Clincal Social Worker  [facility]

## 2021-05-26 NOTE — BHH Group Notes (Signed)
BHH Group Notes:  (Nursing/MHT/Case Management/Adjunct)  Date:  05/26/2021  Time:  9:48 PM  Type of Therapy:    Wrap up  Participation Level:  Active  Participation Quality:  Appropriate and Attentive  Affect:  Appropriate  Cognitive:  Appropriate  Insight:  Appropriate, Good, and Improving  Engagement in Group:  Developing/Improving  Modes of Intervention:  Discussion  Summary of Progress/Problems: PT attended group in a good mood. He said he is happy to be leaving tomorrow and that his chest x-ray came back ok.  Lorita Officer 05/26/2021, 9:48 PM

## 2021-05-26 NOTE — BHH Group Notes (Signed)
Pt attended and contributed to group 

## 2021-05-26 NOTE — Group Note (Signed)
Recreation Therapy Group Note   Group Topic:Animal Assisted Therapy   Group Date: 05/26/2021 Start Time: 1420 End Time: 1507 Facilitators: Caroll Rancher, LRT,CTRS Location: [address]   AAA[facility]T Program Assumption of Risk Form signed by Patient/ or Parent Legal Guardian YES  Patient is free of allergies or severe asthma  YES  Patient reports no fear of animals YES  Patient reports no history of cruelty to animals YES  Patient understands their participation is voluntary YES  Patient washes hands before animal contact YES  Patient washes hands after animal contact YES   Group Description: Patients provided opportunity to interact with trained and credentialed [employer] [facility] dog and the community volunteer/dog handler. Patients practiced appropriate animal interaction and were educated on dog safety outside of the hospital in common community settings. Patients were allowed to use dog toys and other items to practice commands, engage the dog in play, and/or complete routine aspects of animal care.   Education: Charity fundraiser, Health visitor, Communication & Social Skills    Affect/Mood: Drowsy   Participation Level: None    Clinical Observations/Individualized Feedback: Pt sat quietly and eventually dosed off to sleep.     Plan: Continue to engage patient in RT group sessions 2-3x/week.   Caroll Rancher, [place] Jewel 05/26/2021 3:51 PM

## 2021-05-26 NOTE — Progress Notes (Addendum)
[facility] MD Progress Note   05/26/2021 12:56 PM SAMVEL ZINN  MRN:  [id number]  Chief Complaint: delusions and SI  Reason for Admission: ARGIE APPLEGATE is a 61 y.o. male with PPHx of Schizophrenia and multiple hospitalizations, who presented to [facility] via IVC for delusion that he is [name] with threats to jump into a trash compactor. He was then admitted Involuntary to [facility] for treatment of schizophrenia exacerbated by medication selective adherence.  Chart Review from last 24 hours:  The patient's chart was reviewed and nursing notes were reviewed. The patient's case was discussed in multidisciplinary team meeting. Per nursing, patient attended groups and had no acute behavioral issues or safety concerns noted. Per [facility] he was compliant with scheduled medications with exception of Novolog being held yesterday morning and this morning for blood sugar readings.He required no PRNs for agitation or behavioral issues.  Information Obtained Today During Patient Interview: The patient was seen and evaluated on the unit. On assessment today the patient reports that he has been sleeping and eating well.  He voices no physical complaints and denies SI or HI.  He specifically denies issues with constipation, chest pain, shortness of breath, drooling, dizziness, or urinary issues related to ongoing Clozaril use.  He denies AVH, ideas of reference, first rank symptoms, or paranoia.  He continues to have the delusion that he is [name] and that a man named Latrelle Bazar is attempting to get him to take over his identity and go to jail for him.  He is alert and oriented otherwise to place, month, year, city and president.  We discussed his pending transfer to a family care facility which she is in agreement with.  Time was given for questions.   Principal Problem: Schizophrenia ([place]) Diagnosis: Principal Problem:   Schizophrenia ([place]) Active Problems:   Hypertension    Type 2 diabetes mellitus ([place])   Past Psychiatric History: see H&P  Past Medical History:  Past Medical History:  Diagnosis Date   Bipolar affective disorder ([place])    takes Zyprexa daily   Coarse tremors 10/02/2014   Diabetes mellitus    takes Victoza,Metformin,and Glipizide daily   Hypertension    takes Amlodipine,Lisinopril and Clonidine daily   Hyponatremia    history of   Lithium toxicity 10/02/2014   Mental disorder    takes Lithium daily   Schizoaffective disorder    takes Trazodone nightly   Schizoaffective disorder ([place]) 01/04/2019   Seasonal allergies    takes Claritin daily   Sleep apnea    sleep study >14yr ago   Stroke ([facility]    left arm weakness    Past Surgical History:  Procedure Laterality Date   CATARACT EXTRACTION W/PHACO Right 02/14/2013   Procedure: CATARACT EXTRACTION PHACO AND INTRAOCULAR LENS PLACEMENT [place]  Surgeon: GAdonis Brook MD;  [place] [facility]  Service: Ophthalmology;  Laterality: Right;   CATARACT EXTRACTION W/PHACO Left 06/13/2013   Procedure: CATARACT EXTRACTION PHACO AND INTRAOCULAR LENS PLACEMENT (IOC);  Surgeon: GAdonis Brook MD;  [place] [facility]  Service: Ophthalmology;  Laterality: Left;   CIRCUMCISION  20 yrs. ago   EYE SURGERY     Family History:see H&P  Family Psychiatric History: unable to obtain  Social History:  Social History   Substance and Sexual Activity  Alcohol Use Not Currently     Social History   Substance and Sexual Activity  Drug Use No    Social History   Socioeconomic  History   Marital status: Divorced    Spouse name: Not on file   Number of children: Not on file   Years of education: Not on file   Highest education level: Not on file  Occupational History   Not on file  Tobacco Use   Smoking status: Never   Smokeless tobacco: Never  Vaping Use   Vaping Use: Never used  Substance and Sexual Activity   Alcohol use: Not Currently   Drug use: No   Sexual activity: Yes    Birth  control/protection: None  Other Topics Concern   Not on file  Social History Narrative   Not on file   Social Determinants of Health   Financial Resource Strain: Not on file  Food Insecurity: Not on file  Transportation Needs: Not on file  Physical Activity: Not on file  Stress: Not on file  Social Connections: Not on file   Sleep: Good  Appetite: Good  Current Medications: Current Facility-Administered Medications  Medication Dose Route Frequency Provider Last Rate Last Admin   acetaminophen (TYLENOL) tablet 650 mg  650 mg Oral Q6H PRN Lucky Rathke, FNP   650 mg at 05/25/21 1443   alum & mag hydroxide-simeth (MAALOX/MYLANTA) 200-200-20 MG/5ML suspension 30 mL  30 mL Oral Q4H PRN Lucky Rathke, FNP       amLODipine (NORVASC) tablet 10 mg  10 mg Oral QHS Merrily Brittle, DO   10 mg at 05/25/21 2145   aspirin EC tablet 81 mg  81 mg Oral Daily Lucky Rathke, FNP   81 mg at 05/26/21 0758   atenolol (TENORMIN) tablet 12.5 mg  12.5 mg Oral Daily Lucky Rathke, FNP   12.5 mg at 05/26/21 0757   chewing gum (ORBIT) sugar free  1 Stick Oral TID PRN Merrily Brittle, DO       cloNIDine (CATAPRES) tablet 0.2 mg  0.2 mg Oral BID Lucky Rathke, FNP   0.2 mg at 05/26/21 0930   cloZAPine (CLOZARIL) tablet 75 mg  75 mg Oral QHS Merrily Brittle, DO   75 mg at 05/25/21 2145   donepezil (ARICEPT) tablet 5 mg  5 mg Oral QHS Merrily Brittle, DO   5 mg at 05/25/21 2146   haloperidol (HALDOL) tablet 15 mg  15 mg Oral QHS Nelda Marseille, Lyndall Bellot E, MD   15 mg at 05/25/21 2145   haloperidol (HALDOL) tablet 5 mg  5 mg Oral Daily Nelda Marseille, Horris Speros E, MD   5 mg at 05/26/21 0758   insulin aspart (novoLOG) injection 0-5 Units  0-5 Units Subcutaneous QHS Viann Fish E, MD   2 Units at 05/25/21 2223   insulin aspart (novoLOG) injection 0-9 Units  0-9 Units Subcutaneous TID WC Viann Fish E, MD   2 Units at 05/26/21 1206   insulin aspart protamine- aspart (NOVOLOG MIX 70/30) injection 35 Units  35 Units Subcutaneous Q supper  Merrily Brittle, DO   35 Units at 05/25/21 1713   insulin aspart protamine- aspart (NOVOLOG MIX 70/30) injection 52 Units  52 Units Subcutaneous Q breakfast Merrily Brittle, DO   52 Units at 05/24/21 0814   lisinopril (ZESTRIL) tablet 20 mg  20 mg Oral QHS Nelda Marseille, Katianne Barre E, MD   20 mg at 05/25/21 2144   lithium carbonate (ESKALITH) CR tablet 450 mg  450 mg Oral Q12H Lucky Rathke, FNP   450 mg at 05/26/21 0759   OLANZapine zydis (ZYPREXA) disintegrating tablet 10 mg  10 mg Oral Q8H PRN  Corky Sox, MD       And   LORazepam (ATIVAN) tablet 1 mg  1 mg Oral PRN Corky Sox, MD       And   ziprasidone (GEODON) injection 20 mg  20 mg Intramuscular PRN Corky Sox, MD       magnesium hydroxide (MILK OF MAGNESIA) suspension 30 mL  30 mL Oral Daily PRN Lucky Rathke, FNP       senna (SENOKOT) tablet 17.2 mg  2 tablet Oral Daily Merrily Brittle, DO   17.2 mg at 05/26/21 5643    Lab Results:  Results for orders placed or performed during the hospital encounter of 04/23/21 (from the past 48 hour(s))  Glucose, capillary     Status: Abnormal   Collection Time: 05/24/21  5:04 PM  Result Value Ref Range   Glucose-Capillary 233 (H) 70 - 99 mg/dL    Comment: Glucose reference range applies only to samples taken after fasting for at least 8 hours.  Glucose, capillary     Status: Abnormal   Collection Time: 05/24/21  7:54 PM  Result Value Ref Range   Glucose-Capillary 121 (H) 70 - 99 mg/dL    Comment: Glucose reference range applies only to samples taken after fasting for at least 8 hours.  Glucose, capillary     Status: Abnormal   Collection Time: 05/25/21  6:04 AM  Result Value Ref Range   Glucose-Capillary 69 (L) 70 - 99 mg/dL    Comment: Glucose reference range applies only to samples taken after fasting for at least 8 hours.  Glucose, capillary     Status: Abnormal   Collection Time: 05/25/21  6:22 AM  Result Value Ref Range   Glucose-Capillary 101 (H) 70 - 99 mg/dL    Comment: Glucose  reference range applies only to samples taken after fasting for at least 8 hours.  Glucose, capillary     Status: Abnormal   Collection Time: 05/25/21 11:27 AM  Result Value Ref Range   Glucose-Capillary 286 (H) 70 - 99 mg/dL    Comment: Glucose reference range applies only to samples taken after fasting for at least 8 hours.  Glucose, capillary     Status: Abnormal   Collection Time: 05/25/21  4:35 PM  Result Value Ref Range   Glucose-Capillary 238 (H) 70 - 99 mg/dL    Comment: Glucose reference range applies only to samples taken after fasting for at least 8 hours.  Glucose, capillary     Status: Abnormal   Collection Time: 05/25/21  7:49 PM  Result Value Ref Range   Glucose-Capillary 201 (H) 70 - 99 mg/dL    Comment: Glucose reference range applies only to samples taken after fasting for at least 8 hours.  Glucose, capillary     Status: Abnormal   Collection Time: 05/26/21  5:42 AM  Result Value Ref Range   Glucose-Capillary 102 (H) 70 - 99 mg/dL    Comment: Glucose reference range applies only to samples taken after fasting for at least 8 hours.  Glucose, capillary     Status: Abnormal   Collection Time: 05/26/21 11:44 AM  Result Value Ref Range   Glucose-Capillary 174 (H) 70 - 99 mg/dL    Comment: Glucose reference range applies only to samples taken after fasting for at least 8 hours.   Comment 1 Notify RN     Blood Alcohol level:  Lab Results  Component Value Date   ETH <10 04/16/2021   ETH <10 01/26/2021  Metabolic Disorder Labs: Lab Results  Component Value Date   HGBA1C 9.2 (H) 04/24/2021   MPG 217.34 04/24/2021   MPG 271.87 01/26/2021   No results found for: PROLACTIN Lab Results  Component Value Date   CHOL 140 01/30/2021   TRIG 95 01/30/2021   HDL 49 01/30/2021   CHOLHDL 2.9 01/30/2021   VLDL 19 01/30/2021   LDLCALC 72 01/30/2021   LDLCALC 67 05/20/2020   Psychiatric Specialty Exam: Physical Exam Vitals and nursing note reviewed.   Constitutional:      Appearance: He is obese. He is not diaphoretic.  HENT:     Head: Normocephalic.  Pulmonary:     Effort: Pulmonary effort is normal. No respiratory distress.  Neurological:     General: No focal deficit present.     Mental Status: He is alert.  Psychiatric:        Behavior: Behavior is cooperative.     Review of Systems  Respiratory:  Negative for shortness of breath.   Cardiovascular:  Negative for chest pain.  Gastrointestinal:  Negative for constipation, diarrhea, nausea and vomiting.  Neurological:  Negative for dizziness and headaches.    Body mass index is 37.42 kg/m. Temp:  [98 F (36.7 C)-98.1 F (36.7 C)] 98.1 F (36.7 C) (02/28 1443) Pulse Rate:  [80-87] 87 (02/28 0639) Resp:  [17] 17 (02/27 1959) BP: (129-142)/(79-89) 136/82 (02/28 0639) SpO2:  [97 %-100 %] 100 % (02/28 0639)  General Appearance:  Casually dressed, fair hygiene    Eye Contact:   Minimal    Speech:   Rambling and mumbling quality, answers abruptly with short responses to direct questions - nonspontaneous    Volume:   Decreased    Mood:   Described as "good" - appears aloof and ambivalent    Affect:   Constricted     Thought Process:  Concrete but linear with overall poverty of thought  Duration of Psychotic Symptoms:  Greater than six months  Past Diagnosis of Schizophrenia or Psychoactive disorder:  Yes   Orientation:   Oriented to month, year, President, and city and knows he is in a psychiatric hospital but not oriented to person    Thought Content:   Has residual fixed delusion that he is "[name]"; he denies AVH, paranoia, ideas of reference or first rank symptoms.  He is not grossly responding to internal or external stimuli on exam.  He denies SI or HI.   Hallucinations: Denied  Ideas of Reference: Denied   Suicidal Thoughts:   No   Homicidal Thoughts:   No    Memory:   Immediate - fair; recent and remote poor    Judgement:   Poor     Insight:   Poor    Psychomotor Activity:   no tremor noted, no akathisias    Concentration:   Fair    Attention Span:   Fair   Recall:   Poor    Fund of Knowledge:   Limited    Language:   Fair    Assets:   Leisure Time    Sleep:   6.5 hours  AIMS: Facial and Oral Movements Muscles of Facial Expression: None, normal Lips and Perioral Area: None, normal Jaw: None, normal Tongue: None, normal,Extremity Movements Upper (arms, wrists, hands, fingers): None, normal Lower (legs, knees, ankles, toes): None, normal, Trunk Movements Neck, shoulders, hips: None, normal, Overall Severity Severity of abnormal movements (highest score from questions above): None, normal Incapacitation due to abnormal movements: None, normal  Patient's awareness of abnormal movements (rate only patient's report): No Awareness, Dental Status Current problems with teeth and/or dentures?: No Does patient usually wear dentures?: No   AIMS 0 (2/13 and on 2/16) MOCA: 13/30  MMSE: 14/30 Pillbox test: Failed    ASSESSMENT: Diagnoses / Active Problems: Schizophrenia by hx Type II DM HTN Hyponatremia Elevated Creatinine High risk medication use Elevated transaminases Anemia Cognitive impairment (r/o major neurocognitive d/o)  PLAN: Safety and Monitoring:  -- Voluntary admission to inpatient psychiatric unit for safety, stabilization and treatment  -- Daily contact with patient to assess and evaluate symptoms and progress in treatment  -- Patient's case to be discussed in multi-disciplinary team meeting  -- Observation Level : q15 minute checks  -- Vital signs:  q12 hours  -- Precautions: suicide, elopement, and assault  2. Psychiatric Diagnoses and Treatment:   Schizophrenia by hx -Continued Haldol 5 mg qAM & 15 mg qPM for psychosis -Continued Clozaril 75 mg nightly for psychosis - WBC  6.7 and ANC 3400 on 05/21/21; Troponin 2 and CRP 2.1 on 05/15/21 - Clozaril level 187, NorClozapine 87,  and total Cloz + Nor-cloz level 273 (2/5) - QTC 471m on (2/7) - QTC 4220m(2/18) - A1c 9.2 (1/27) and lipids WNL in 11/22 - Continue senokot 2 tabs daily for Clozaril-induced constipation  -Continue Lithium 450 mg BID for mood stability - Li level 0.89 (2/17) - Cr (!) 1.27 (2/17)- trending while on Lithium and po hydration encouraged  - TSH 2.233 (1/31) - ACTT referral made  - Pending transfer to family care facility (TB blood test and COVID test pending)  Cognitive impairment (r/o major neurocognitive d/o) MOCA: 13/30  MMSE: 14/30 Pillbox test: Failed (per OT patient is not able to manage his own medication. Patient will need 24/7 caregiver support to assist with IADLs and medication management to be successful)  - Ongoing concern for ability to live independently and to complete IADLs and manage his insulin regimen and medications- He shows no insight or understanding into his complex medication regimen, the risks associated with not being medication compliant, nor his medical or psychiatric diagnoses.. APS report accepted and guardianship recommended by medical providers - Follow-up on patient guardianship recommendations - Patient now receptive to transition to family care center - Continued Aricept 5 mg qHS for poor memory    3. Medical Issues Being Addressed:   T2DM with insulin dependence - A1C of 9.2. Diabetic coordinator on-board for insulin management -Continued insulin per diabetic coordinator with reduction recommended to 70/30 30u with breakfast and 30u with supper given recent FSBS readings   - Changed SSI to sensitive coverage per recommendations of diabetic coordinator - Continued HS coverage -Placed on unit restriction for meals to monitor portions and carb choices   HTN -Lisinopril 2065maily (reduced due to concern for creatinine) -Amlodipine 10 mg daily -Clonidine 0.2 mg BID -Atenolol 12.5mg33mily  Elevated transaminases, down trending - AST 76 and ALT 94  (2/17); Alk phos 161 (2/17) - Med consulted, stated that no further work up necessary - Hepatitis panel nonreactive, ammonia 35, negative RUQ u/s 02/10/21 - Will need outpatient f/u after discharge  Mild Hyponatremia - Na+ 132  on 2/17 - repeating tonight for trending  Elevated Creatinine - 1.27 (2/17) - encouraging po hydration and repeating tonight for trending  Anemia  - Repeat H/H up to 12.1/36.3 - Ferritin 80, Iron/TIBC WNL, Folate 10.5, B12 366 - F/u with PCP after discharge  4. Discharge Planning:   -- Social  work and case management to assist with discharge planning and identification of hospital follow-up needs prior to discharge  -- Discharge Goals: To family care home for assistance with meds and ADLs with outpatient referrals for mental health follow-up including medication management/psychotherapy   Shahram Alexopoulos Artis Delay, MD, [facility]

## 2021-05-26 NOTE — Care Management Important Message (Signed)
Medicare IM printed for Darletta Moll, LCSW, to give to the patient.

## 2021-05-26 NOTE — Progress Notes (Signed)
Inpatient Diabetes Program Recommendations  AACE/ADA: New Consensus Statement on Inpatient Glycemic Control (2015)  Target Ranges:  Prepandial:   less than 140 mg/dL      Peak postprandial:   less than 180 mg/dL (1-2 hours)      Critically ill patients:  140 - 180 mg/dL   Lab Results  Component Value Date   GLUCAP 102 (H) 05/26/2021   HGBA1C 9.2 (H) 04/24/2021    Review of Glycemic Control  Diabetes history: DM 2 Outpatient Diabetes medications: Glipizide 20 mg Daily, 70/30 48 units qam, 42 units qpm, Novolog 0-9 units tid Current orders for Inpatient glycemic control:  Novolog 0-9 units tid + hs 70/30 52 units qam (not given in 2 days) 70/30 35 units qpm  Consult for d/c recs based on glucose trends inpt  Inpatient Diabetes Program Recommendations:    -  70/30 30 units bid  Have PCP titrate after d/c.  Thanks,  Christena Deem RN, MSN, BC-ADM Inpatient Diabetes Coordinator Team Pager [phone] (8a-5p)

## 2021-05-26 NOTE — Progress Notes (Signed)
RN performed covid test as pt is scheduled to discharge home tomorrow.  Covid test was negative.  RN called [facility] [employer].  Systems developer that [employer] TB test results would not be ready until the end of the week.  Pt went to [facility] to get chest xray to r/o TB.

## 2021-05-27 ENCOUNTER — Encounter (HOSPITAL_COMMUNITY): Payer: Self-pay

## 2021-05-27 LAB — BASIC METABOLIC PANEL
Anion gap: 8 (ref 5–15)
BUN: 20 mg/dL (ref 6–20)
CO2: 26 mmol/L (ref 22–32)
Calcium: 9.6 mg/dL (ref 8.9–10.3)
Chloride: 103 mmol/L (ref 98–111)
Creatinine, Ser: 1.24 mg/dL (ref 0.61–1.24)
GFR, Estimated: >60 mL/min (ref >60)
Glucose, Bld: 182 mg/dL — ABNORMAL HIGH (ref 70–99)
Potassium: 4 mmol/L (ref 3.5–5.1)
Sodium: 137 mmol/L (ref 135–145)

## 2021-05-27 LAB — GLUCOSE, CAPILLARY
Glucose-Capillary: 174 mg/dL — ABNORMAL HIGH (ref 70–99)
Glucose-Capillary: 190 mg/dL — ABNORMAL HIGH (ref 70–99)

## 2021-05-27 LAB — LITHIUM LEVEL: Lithium Lvl: 0.72 mmol/L (ref 0.60–1.20)

## 2021-05-27 MED ORDER — DONEPEZIL HCL 5 MG PO TABS: 5.0000 mg | ORAL_TABLET | Freq: Every day | ORAL | 0 refills | Status: DC

## 2021-05-27 MED ORDER — ATENOLOL 25 MG PO TABS: 12.5000 mg | ORAL_TABLET | Freq: Every day | ORAL | 0 refills | Status: DC

## 2021-05-27 MED ORDER — SENNA 8.6 MG PO TABS: 2.0000 | ORAL_TABLET | Freq: Every day | ORAL | 0 refills | Status: DC

## 2021-05-27 MED ORDER — HALOPERIDOL 5 MG PO TABS: 15.0000 mg | ORAL_TABLET | Freq: Every day | ORAL | 0 refills | Status: DC

## 2021-05-27 MED ORDER — LISINOPRIL 20 MG PO TABS: 20.0000 mg | ORAL_TABLET | Freq: Every day | ORAL | 0 refills | Status: DC

## 2021-05-27 MED ORDER — ASPIRIN 81 MG PO TBEC: 81.0000 mg | DELAYED_RELEASE_TABLET | Freq: Every day | ORAL | 11 refills | Status: DC

## 2021-05-27 MED ORDER — HALOPERIDOL 5 MG PO TABS: 5.0000 mg | ORAL_TABLET | Freq: Every day | ORAL | 0 refills | Status: DC

## 2021-05-27 MED ORDER — LITHIUM CARBONATE ER 450 MG PO TBCR: 450.0000 mg | EXTENDED_RELEASE_TABLET | Freq: Two times a day (BID) | ORAL | 0 refills | Status: DC

## 2021-05-27 MED ORDER — AMLODIPINE BESYLATE 10 MG PO TABS: 10.0000 mg | ORAL_TABLET | Freq: Every day | ORAL | 0 refills | Status: DC

## 2021-05-27 MED ORDER — CLOZAPINE 25 MG PO TABS: 75.0000 mg | ORAL_TABLET | Freq: Every day | ORAL | 0 refills | Status: DC

## 2021-05-27 NOTE — BH IP Treatment Plan (Signed)
Interdisciplinary Treatment and Diagnostic Plan Update  05/27/2021 Time of Session: 10:35am  Mitchell [name] MRN: [id number]  Principal Diagnosis: Schizophrenia [facility])  Secondary Diagnoses: Principal Problem:   Schizophrenia (HCC) Active Problems:   Hypertension   Type 2 diabetes mellitus (HCC)   Current Medications:  Current Facility-Administered Medications  Medication Dose Route Frequency Provider Last Rate Last Admin   acetaminophen (TYLENOL) tablet 650 mg  650 mg Oral Q6H PRN Lenard Lance, FNP   650 mg at 05/25/21 1443   alum & mag hydroxide-simeth (MAALOX/MYLANTA) 200-200-20 MG/5ML suspension 30 mL  30 mL Oral Q4H PRN Lenard Lance, FNP       amLODipine (NORVASC) tablet 10 mg  10 mg Oral QHS Princess Bruins, DO   10 mg at 05/26/21 2106   aspirin EC tablet 81 mg  81 mg Oral Daily Lenard Lance, FNP   81 mg at 05/27/21 0800   atenolol (TENORMIN) tablet 12.5 mg  12.5 mg Oral Daily Lenard Lance, FNP   12.5 mg at 05/27/21 0800   chewing gum (ORBIT) sugar free  1 Stick Oral TID PRN Princess Bruins, DO       cloNIDine (CATAPRES) tablet 0.2 mg  0.2 mg Oral BID Lenard Lance, FNP   0.2 mg at 05/27/21 0802   cloZAPine (CLOZARIL) tablet 75 mg  75 mg Oral QHS Princess Bruins, DO   75 mg at 05/26/21 2106   donepezil (ARICEPT) tablet 5 mg  5 mg Oral QHS Princess Bruins, DO   5 mg at 05/26/21 2108   haloperidol (HALDOL) tablet 15 mg  15 mg Oral QHS Bartholomew Crews E, MD   15 mg at 05/26/21 2105   haloperidol (HALDOL) tablet 5 mg  5 mg Oral Daily Mason Jim, Amy E, MD   5 mg at 05/27/21 0802   insulin aspart (novoLOG) injection 0-5 Units  0-5 Units Subcutaneous QHS Comer Locket, MD   2 Units at 05/25/21 2223   insulin aspart (novoLOG) injection 0-9 Units  0-9 Units Subcutaneous TID WC Comer Locket, MD   3 Units at 05/27/21 1157   insulin aspart protamine- aspart (NOVOLOG MIX 70/30) injection 30 Units  30 Units Subcutaneous Q supper Comer Locket, MD   30 Units at 05/26/21 1733   insulin aspart  protamine- aspart (NOVOLOG MIX 70/30) injection 30 Units  30 Units Subcutaneous Q breakfast Comer Locket, MD   30 Units at 05/27/21 0801   lisinopril (ZESTRIL) tablet 20 mg  20 mg Oral QHS Mason Jim, Amy E, MD   20 mg at 05/26/21 2106   lithium carbonate (ESKALITH) CR tablet 450 mg  450 mg Oral Q12H Lenard Lance, FNP   450 mg at 05/27/21 0800   OLANZapine zydis (ZYPREXA) disintegrating tablet 10 mg  10 mg Oral Q8H PRN Carlyn Reichert, MD       And   LORazepam (ATIVAN) tablet 1 mg  1 mg Oral PRN Carlyn Reichert, MD       And   ziprasidone (GEODON) injection 20 mg  20 mg Intramuscular PRN Carlyn Reichert, MD       magnesium hydroxide (MILK OF MAGNESIA) suspension 30 mL  30 mL Oral Daily PRN Lenard Lance, FNP       senna (SENOKOT) tablet 17.2 mg  2 tablet Oral Daily Princess Bruins, DO   17.2 mg at 05/27/21 0800   Current Outpatient Medications  Medication Sig Dispense Refill   amLODipine (NORVASC) 10 MG tablet Take 1  tablet (10 mg total) by mouth at bedtime. 30 tablet 0   [START ON 05/28/2021] aspirin EC 81 MG EC tablet Take 1 tablet (81 mg total) by mouth daily. Swallow whole. 30 tablet 11   [START ON 05/28/2021] atenolol (TENORMIN) 25 MG tablet Take 0.5 tablets (12.5 mg total) by mouth daily. 15 tablet 0   cloNIDine (CATAPRES) 0.2 MG tablet Take 0.2 mg by mouth 2 (two) times daily. (Patient not taking: Reported on 01/26/2021)     cloZAPine (CLOZARIL) 25 MG tablet Take 3 tablets (75 mg total) by mouth at bedtime. 90 tablet 0   donepezil (ARICEPT) 5 MG tablet Take 1 tablet (5 mg total) by mouth at bedtime. 30 tablet 0   [START ON 05/28/2021] haloperidol (HALDOL) 5 MG tablet Take 1 tablet (5 mg total) by mouth daily. 30 tablet 0   haloperidol (HALDOL) 5 MG tablet Take 3 tablets (15 mg total) by mouth at bedtime. 90 tablet 0   insulin aspart (NOVOLOG) 100 UNIT/ML injection Inject 0-9 Units into the skin 3 (three) times daily with meals. 10 mL 11   insulin aspart protamine - aspart (NOVOLOG MIX 70/30  FLEXPEN) (70-30) 100 UNIT/ML FlexPen Inject 48 Units into the skin daily with breakfast. 15 mL 0   insulin aspart protamine- aspart (NOVOLOG MIX 70/30) (70-30) 100 UNIT/ML injection Inject 0.42 mLs (42 Units total) into the skin daily with supper. 10 mL 11   lisinopril (ZESTRIL) 20 MG tablet Take 1 tablet (20 mg total) by mouth at bedtime. 30 tablet 0   lithium carbonate (ESKALITH) 450 MG CR tablet Take 1 tablet (450 mg total) by mouth every 12 (twelve) hours. 60 tablet 0   [START ON 05/28/2021] senna (SENOKOT) 8.6 MG TABS tablet Take 2 tablets (17.2 mg total) by mouth daily. 120 tablet 0   PTA Medications: No medications prior to admission.    Patient Stressors: Marital or family conflict   Medication change or noncompliance    Patient Strengths: Automotive engineer for treatment/growth   Treatment Modalities: Medication Management, Group therapy, Case management,  1 to 1 session with clinician, Psychoeducation, Recreational therapy.   Physician Treatment Plan for Primary Diagnosis: Schizophrenia (HCC) Long Term Goal(s):     Short Term Goals:    Medication Management: Evaluate patient's response, side effects, and tolerance of medication regimen.  Therapeutic Interventions: 1 to 1 sessions, Unit Group sessions and Medication administration.  Evaluation of Outcomes: Adequate for Discharge  Physician Treatment Plan for Secondary Diagnosis: Principal Problem:   Schizophrenia (HCC) Active Problems:   Hypertension   Type 2 diabetes mellitus (HCC)  Long Term Goal(s):     Short Term Goals:       Medication Management: Evaluate patient's response, side effects, and tolerance of medication regimen.  Therapeutic Interventions: 1 to 1 sessions, Unit Group sessions and Medication administration.  Evaluation of Outcomes: Adequate for Discharge   RN Treatment Plan for Primary Diagnosis: Schizophrenia (HCC) Long Term Goal(s): Knowledge of disease and therapeutic  regimen to maintain health will improve  Short Term Goals: Ability to remain free from injury will improve, Ability to participate in decision making will improve, Ability to verbalize feelings will improve, Ability to disclose and discuss suicidal ideas, and Ability to identify and develop effective coping behaviors will improve  Medication Management: RN will administer medications as ordered by provider, will assess and evaluate patient's response and provide education to patient for prescribed medication. RN will report any adverse and/or side effects to prescribing  provider.  Therapeutic Interventions: 1 on 1 counseling sessions, Psychoeducation, Medication administration, Evaluate responses to treatment, Monitor vital signs and CBGs as ordered, Perform/monitor CIWA, COWS, AIMS and Fall Risk screenings as ordered, Perform wound care treatments as ordered.  Evaluation of Outcomes: Adequate for Discharge   LCSW Treatment Plan for Primary Diagnosis: Schizophrenia [facility]) Long Term Goal(s): Safe transition to appropriate next level of care at discharge, Engage patient in therapeutic group addressing interpersonal concerns.  Short Term Goals: Engage patient in aftercare planning with referrals and resources, Increase social support, Increase emotional regulation, Facilitate acceptance of mental health diagnosis and concerns, Identify triggers associated with mental health/substance abuse issues, and Increase skills for wellness and recovery  Therapeutic Interventions: Assess for all discharge needs, 1 to 1 time with Social worker, Explore available resources and support systems, Assess for adequacy in community support network, Educate family and significant other(s) on suicide prevention, Complete Psychosocial Assessment, Interpersonal group therapy.  Evaluation of Outcomes: Adequate for Discharge   Progress in Treatment: Attending groups: Yes. Participating in groups: Yes. Taking medication  as prescribed: Yes. Toleration medication: Yes. Family/Significant other contact made: Yes, individual(s) contacted:  Declined Consents  Patient understands diagnosis: No. Discussing patient identified problems/goals with staff: Yes. Medical problems stabilized or resolved: Yes. Denies suicidal/homicidal ideation: Yes. Issues/concerns per patient self-inventory: No. Other: None   New problem(s) identified: No, Describe:  None   New Short Term/Long Term Goal(s):medication stabilization, elimination of SI thoughts, development of comprehensive mental wellness plan.    Patient Goals:     Discharge Plan or Barriers: Patient went to [facility] and will follow up with his ACTT team.    Reason for Continuation of Hospitalization: Medication stabilization   Estimated Length of Stay:Adequate for Discharge    Scribe for Treatment Team: Catha Brow 05/27/2021 3:31 PM

## 2021-05-27 NOTE — BHH Suicide Risk Assessment (Signed)
[facility] Discharge Suicide Risk Assessment ? ? ?Principal Problem: Schizophrenia (HCC) ?Discharge Diagnoses: Principal Problem: ?  Schizophrenia (HCC) ?Active Problems: ?  Hypertension ?  Type 2 diabetes mellitus (HCC) ? ? ?Total Time Spent in Direct Patient Care:  ?I personally spent 35 minutes on the unit in direct patient care. The direct patient care time included face-to-face time with the patient, reviewing the patient's chart, communicating with other professionals, and coordinating care. Greater than 50% of this time was spent in counseling or coordinating care with the patient regarding goals of hospitalization, psycho-education, and discharge planning needs. ? ?Subjective: Patient was seen on rounds.  He states that he is having no physical complaints today and specifically denies issues with constipation, drooling, chest pain, shortness of breath, dizziness or urinary issues while on Clozaril.  He reports stable sleep and appetite. He was encouraged to be compliant with his medications and was encouraged to fluid hydrate.  He denies SI, HI, AVH, paranoia, ideas of reference, or first rank symptoms.  He does continue to have the delusion that he is [name] but is pleasant and is not argumentative about this fixed delusion today.  Time was spent discussing the need for ongoing metabolic lab monitoring, weekly CBC monitoring, as well as AIMS, weight, and EKG monitoring while on Clozaril and Haldol.  He was advised that he would need ongoing monitoring of his lithium level, kidney function, electrolytes and TSH while on lithium.  He was encouraged to monitor for constipation on his present medication regimen.  He was advised to abstain from alcohol or illicit drug use after discharge and to comply with outpatient follow-up appointments. ? ?Labs on 05/27/2021: Lithium level 0.72, sodium 137, creatinine 1.24 with remainder of the basic metabolic panel within normal  limits other than a glucose of  182 ? ?Musculoskeletal: ?Strength & Muscle Tone: within normal limits ?Gait & Station: normal ?Patient leans: N/A ? ?Psychiatric Specialty Exam ? ?Presentation  ?General Appearance: Casually dressed, fair hygiene ? ?Eye Contact:Fair ? ?Speech:mumbling quality, answers with short responses ? ?Speech Volume:Decreased ? ?Mood and Affect  ?Mood:described as good - appears ambivalent ? ?Affect:constricted ? ? ?Thought Process  ?Thought Processes:Concrete but linear with overall poverty of thought ? ?Orientation:oriented to year, month, place, city but not self ? ?Thought Content:Has residual fixed delusion that he is "[employer]"; he denies AVH, paranoia, ideas of reference or first rank symptoms.  He is not grossly responding to internal or external stimuli on exam.  He denies SI or HI.  ? ?History of Schizophrenia/Schizoaffective disorder:Yes ? ?Duration of Psychotic Symptoms:Greater than six months ? ?Hallucinations:Denied ? ?Ideas of Reference:Denied ? ?Suicidal Thoughts:Denied ? ?Homicidal Thoughts:Denied ? ?Sensorium  ?Memory:Immediate Fair; Recent Fair; Remote Poor ? ?Judgment:Poor ? ?Insight:Poor ? ? ?Executive Functions  ?Concentration:Fair ? ?Attention Span:Fair ? ?Recall:Poor ? ?Fund of Knowledge:Limited ? ?Language:Fair ? ? ?Psychomotor Activity  ?Psychomotor Activity:No cogwheeling, no stiffness, no tremor, AIMS 0 ? ?Assets  ?Assets:Leisure Time ? ?Physical Exam: ?Physical Exam ?Vitals reviewed.  ?Constitutional:   ?   Appearance: He is obese.  ?HENT:  ?   Head: Normocephalic.  ?Pulmonary:  ?   Effort: Pulmonary effort is normal.  ?Neurological:  ?   General: No focal deficit present.  ?   Mental Status: He is alert.  ? ?Review of Systems  ?Respiratory:  Negative for shortness of breath.   ?Cardiovascular:  Negative for chest pain and palpitations.  ?Gastrointestinal:  Negative for abdominal pain, constipation, diarrhea, nausea and vomiting.  ?Genitourinary:  Negative for urgency.  ?Neurological:  Negative  for dizziness and headaches.  ?Blood pressure 115/70, pulse 94, temperature 98.2 ?F (36.8 ?C), temperature source Oral, resp. rate 17, height 5\' 4"  (1.626 m), weight 98.9 kg, SpO2 95 %. Body mass index is 37.42 kg/m?. ? ?Mental Status Per Nursing Assessment::   ?On Admission:  delusions and SI ? ?Demographic Factors:  ?Male and Unemployed, living alone prior to admission ? ?Loss Factors: ?Decrease in vocational status, Decline in physical health, and Financial problems/change in socioeconomic status ? ?Historical Factors: ?Impulsivity, previous psychiatric diagnoses/treatments ? ?Risk Reduction Factors:   ?Positive social support, living with family care home after discharge, ACTT services ? ?Continued Clinical Symptoms:  ?Previous Psychiatric Diagnoses and Treatments ?Schizophrenia diagnosis ? ?Cognitive Features That Contribute To Risk:  ?Loss of executive function   ? ?Suicide Risk:  ?Mild:  There are no identifiable plans, no associated intent, mild dysphoria and related symptoms, some other risk factors, and identifiable protective factors, including available and accessible social support. ? ? Follow-up Information   ? ? [facility] Follow up.   ?Why: A referral has been made on your behalf for ACTT services. ?Contact information: ?[address] ?[place] ?[phone] ? ? ?  ?  ? ? [facility] Follow up.   ?Why: Please continue with this provider for community support services. ?Contact information: ?[address] ?[place], [place]         ? ?Phone: [phone] ? ?  ?  ? ?  ?  ? ?  ? ? ?Plan Of Care/Follow-up recommendations:  ?Activity:  as tolerated ?Diet:  heart healthy/diabetic ?Other:  Patient was advised that he will need his ACT team to assist with weekly CBCs and required lab monitoring while on Clozaril.  He was advised he will need ongoing monitoring of his metabolic labs, weight, EKG, AIMS, and CBC while on 2 antipsychotic medications.  He was  reminded he will need ongoing monitoring of his TSH, lithium level, electrolytes, and serum creatinine while on lithium.  He was encouraged to have an outpatient primary care provider manage his diabetes, hypertension, chronically elevated creatinine, intermittent low sodium, elevated liver function panel, elevated alkaline phosphatase, and anemia without fail after discharge.  He was encouraged to continue fluid hydrating and to monitor for constipation on his current medication regimen.  He was advised to abstain from alcohol and illicit drug use after discharge. ? ?[phone], MD, FAPA ?05/27/2021, 8:00 AM ?

## 2021-05-27 NOTE — Group Note (Signed)
Recreation Therapy Group Note ? ? ?Group Topic:Stress Management  ?Group Date: 05/27/2021 ?Start Time: 0930 ?End Time: 0950 ?Facilitators: Victorino Sparrow, LRT,CTRS ?Location: [place] ? ? ?Goal Area(s) Addresses:  ?Patient will actively participate in stress management techniques presented during session.  ?Patient will successfully identify benefit of practicing stress management post d/c.  ? ?Group Description: Guided Imagery. LRT provided education, instruction, and demonstration on practice of visualization via guided imagery. Patient was asked to participate in the technique introduced during session. LRT debriefed including topics of mindfulness, stress management and specific scenarios each patient could use these techniques. Patients were given suggestions of ways to access scripts post d/c and encouraged to explore Youtube and other apps available on smartphones, tablets, and computers. ? ? ?Affect/Mood: N/A ?  ?Participation Level: Did not attend ?  ? ?Clinical Observations/Individualized Feedback:   ? ? ?Plan: Continue to engage patient in RT group sessions 2-3x/week. ? ? ?Victorino Sparrow, LRT,CTRS ?05/27/2021 11:55 AM ?

## 2021-05-27 NOTE — Progress Notes (Signed)
?  [facility] Adult Case Management Discharge Plan : ? ?Will you be returning to the same living situation after discharge:  No. Will be discharging to [facility] ?At discharge, do you have transportation home?: No. Taxi Voucher will be used ?Do you have the ability to pay for your medications: Yes,  has insurance ? ?Release of information consent forms completed and in the chart;  Patient's signature needed at discharge. ? ?Patient to Follow up at: ? Follow-up Information   ? ? [facility] Follow up.   ?Why: A referral has been made on your behalf for ACTT services. ?Contact information: ?[address] ?[place] [address] ?[phone] ? ? ?  ?  ? ? [facility] Follow up.   ?Why: Please continue with this provider for community support services. ?Contact information: ?[address] ?[place], [place] [address]         ? ?Phone: [phone] ? ?  ?  ? ?  ?  ? ?  ? ? ?Next level of care provider has access to [facility] Link:no ? ?Safety Planning and Suicide Prevention discussed: Yes,  with CST Team ? ?  ? ?Has patient been referred to the Quitline?: N/A patient is not a smoker ? ?Patient has been referred for addiction treatment: N/A ? ?Otelia Santee, LCSW ?05/27/2021, 9:55 AM ?

## 2021-05-27 NOTE — Progress Notes (Signed)
?   05/26/21 2106  ?Psych Admission Type (Psych Patients Only)  ?Admission Status Voluntary  ?Psychosocial Assessment  ?Patient Complaints None  ?Eye Contact Fair  ?Facial Expression Animated  ?Affect Appropriate to circumstance  ?Speech Logical/coherent  ?Interaction Assertive  ?Motor Activity Slow;Pacing  ?Appearance/Hygiene Unremarkable  ?Behavior Characteristics Appropriate to situation;Cooperative  ?Mood Pleasant;Euthymic  ?Thought Process  ?Coherency WDL  ?Content WDL  ?Delusions None reported or observed  ?Perception WDL  ?Hallucination None reported or observed  ?Judgment Poor  ?Confusion Moderate  ?Danger to Self  ?Current suicidal ideation? Denies  ?Danger to Others  ?Danger to Others None reported or observed  ? ?Mitchell [name] voiced no complaints this evening.  He attended evening wrap up group.  He denied SI/HI or AVH.  He was encouraged to drink plenty of fluids.  2 large cups of water, 2 cartons of milk and a diet ginger ale.  Extra snack given to keep from having low CBG in the morning.  Q 15 minute checks maintained for safety.   ?
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
## 2021-05-27 NOTE — Progress Notes (Signed)
Patient ID: Mitchell [name], male   DOB: February 22, 1961, 61 y.o.   MRN: [id number] ?Discharge note: Patient discharged to group home per MD order. Patient picked up by [employer] to be transferred to [facility]. Patient received 3 days worth of Clozoril. Reviewed transition record and discharge instructions with patient and he indicates understanding. Patient denies any thoughts of self harm. He received all personal belongings from unit and room. Patient left ambulatory with cab service.  ?

## 2021-05-27 NOTE — Progress Notes (Signed)
?   05/27/21 0900  ?Psych Admission Type (Psych Patients Only)  ?Admission Status Voluntary  ?Psychosocial Assessment  ?Patient Complaints None  ?Eye Contact Fair  ?Facial Expression Animated  ?Affect Appropriate to circumstance  ?Speech Logical/coherent  ?Interaction Assertive  ?Motor Activity Slow  ?Appearance/Hygiene Unremarkable  ?Behavior Characteristics Cooperative  ?Mood Pleasant  ?Thought Process  ?Coherency WDL  ?Content WDL  ?Delusions None reported or observed  ?Perception WDL  ?Hallucination None reported or observed  ?Judgment Poor  ?Confusion Mild  ?Danger to Self  ?Current suicidal ideation? Denies  ?Danger to Others  ?Danger to Others None reported or observed  ? ? ?

## 2021-09-09 ENCOUNTER — Ambulatory Visit: Payer: Medicare Other | Admitting: Podiatry

## 2021-09-09 ENCOUNTER — Other Ambulatory Visit: Payer: Self-pay | Admitting: *Deleted

## 2021-10-15 ENCOUNTER — Ambulatory Visit: Payer: Medicare Other | Admitting: Podiatry

## 2021-10-16 ENCOUNTER — Ambulatory Visit (INDEPENDENT_AMBULATORY_CARE_PROVIDER_SITE_OTHER): Payer: Medicare Other | Admitting: Podiatry

## 2021-10-16 DIAGNOSIS — Z91199 Patient's noncompliance with other medical treatment and regimen due to unspecified reason: Secondary | ICD-10-CM

## 2021-10-16 NOTE — Progress Notes (Signed)
No show

## 2022-01-06 ENCOUNTER — Inpatient Hospital Stay (HOSPITAL_COMMUNITY)
Admission: EM | Admit: 2022-01-06 | Discharge: 2022-01-08 | DRG: 917 | Disposition: A | Discharge status: Home / Self Care | Payer: Medicare Other | Attending: Infectious Diseases | Admitting: Infectious Diseases

## 2022-01-06 ENCOUNTER — Emergency Department (HOSPITAL_COMMUNITY): Payer: Medicare Other

## 2022-01-06 ENCOUNTER — Other Ambulatory Visit: Payer: Self-pay

## 2022-01-06 ENCOUNTER — Encounter (HOSPITAL_COMMUNITY): Payer: Self-pay

## 2022-01-06 DIAGNOSIS — E119 Type 2 diabetes mellitus without complications: Secondary | ICD-10-CM | POA: Diagnosis present

## 2022-01-06 DIAGNOSIS — F259 Schizoaffective disorder, unspecified: Secondary | ICD-10-CM | POA: Diagnosis present

## 2022-01-06 DIAGNOSIS — T43591A Poisoning by other antipsychotics and neuroleptics, accidental (unintentional), initial encounter: Secondary | ICD-10-CM | POA: Diagnosis present

## 2022-01-06 DIAGNOSIS — E876 Hypokalemia: Secondary | ICD-10-CM | POA: Diagnosis present

## 2022-01-06 DIAGNOSIS — I69334 Monoplegia of upper limb following cerebral infarction affecting left non-dominant side: Secondary | ICD-10-CM | POA: Diagnosis not present

## 2022-01-06 DIAGNOSIS — G928 Other toxic encephalopathy: Secondary | ICD-10-CM | POA: Diagnosis present

## 2022-01-06 DIAGNOSIS — E669 Obesity, unspecified: Secondary | ICD-10-CM | POA: Diagnosis present

## 2022-01-06 DIAGNOSIS — Z794 Long term (current) use of insulin: Secondary | ICD-10-CM | POA: Diagnosis not present

## 2022-01-06 DIAGNOSIS — R2990 Unspecified symptoms and signs involving the nervous system: Secondary | ICD-10-CM

## 2022-01-06 DIAGNOSIS — Z79899 Other long term (current) drug therapy: Secondary | ICD-10-CM | POA: Diagnosis not present

## 2022-01-06 DIAGNOSIS — F209 Schizophrenia, unspecified: Secondary | ICD-10-CM | POA: Diagnosis present

## 2022-01-06 DIAGNOSIS — Z6834 Body mass index (BMI) 34.0-34.9, adult: Secondary | ICD-10-CM

## 2022-01-06 DIAGNOSIS — G252 Other specified forms of tremor: Secondary | ICD-10-CM | POA: Diagnosis present

## 2022-01-06 DIAGNOSIS — R4182 Altered mental status, unspecified: Secondary | ICD-10-CM | POA: Diagnosis not present

## 2022-01-06 DIAGNOSIS — R471 Dysarthria and anarthria: Secondary | ICD-10-CM | POA: Diagnosis present

## 2022-01-06 DIAGNOSIS — F319 Bipolar disorder, unspecified: Secondary | ICD-10-CM | POA: Diagnosis present

## 2022-01-06 DIAGNOSIS — Z7984 Long term (current) use of oral hypoglycemic drugs: Secondary | ICD-10-CM | POA: Diagnosis not present

## 2022-01-06 DIAGNOSIS — I10 Essential (primary) hypertension: Secondary | ICD-10-CM | POA: Diagnosis present

## 2022-01-06 DIAGNOSIS — Z1152 Encounter for screening for COVID-19: Secondary | ICD-10-CM

## 2022-01-06 DIAGNOSIS — Z7982 Long term (current) use of aspirin: Secondary | ICD-10-CM | POA: Diagnosis not present

## 2022-01-06 DIAGNOSIS — N179 Acute kidney failure, unspecified: Secondary | ICD-10-CM | POA: Diagnosis present

## 2022-01-06 DIAGNOSIS — J302 Other seasonal allergic rhinitis: Secondary | ICD-10-CM | POA: Diagnosis present

## 2022-01-06 DIAGNOSIS — T56891A Toxic effect of other metals, accidental (unintentional), initial encounter: Secondary | ICD-10-CM

## 2022-01-06 DIAGNOSIS — G4733 Obstructive sleep apnea (adult) (pediatric): Secondary | ICD-10-CM | POA: Diagnosis present

## 2022-01-06 DIAGNOSIS — R569 Unspecified convulsions: Secondary | ICD-10-CM | POA: Diagnosis present

## 2022-01-06 DIAGNOSIS — G9340 Encephalopathy, unspecified: Secondary | ICD-10-CM | POA: Insufficient documentation

## 2022-01-06 LAB — COMPREHENSIVE METABOLIC PANEL
ALT: 31 U/L (ref 0–44)
AST: 27 U/L (ref 15–41)
Albumin: 4.1 g/dL (ref 3.5–5.0)
Alkaline Phosphatase: 170 U/L — ABNORMAL HIGH (ref 38–126)
Anion gap: 6 (ref 5–15)
BUN: 13 mg/dL (ref 6–20)
CO2: 26 mmol/L (ref 22–32)
Calcium: 9.9 mg/dL (ref 8.9–10.3)
Chloride: 105 mmol/L (ref 98–111)
Creatinine, Ser: 1.68 mg/dL — ABNORMAL HIGH (ref 0.61–1.24)
GFR, Estimated: 46 mL/min — ABNORMAL LOW (ref >60)
Glucose, Bld: 179 mg/dL — ABNORMAL HIGH (ref 70–99)
Potassium: 4.6 mmol/L (ref 3.5–5.1)
Sodium: 137 mmol/L (ref 135–145)
Total Bilirubin: 0.6 mg/dL (ref 0.3–1.2)
Total Protein: 8.3 g/dL — ABNORMAL HIGH (ref 6.5–8.1)

## 2022-01-06 LAB — DIFFERENTIAL
Abs Immature Granulocytes: 0.05 10*3/uL (ref 0.00–0.07)
Basophils Absolute: 0.1 10*3/uL (ref 0.0–0.1)
Basophils Relative: 1 %
Eosinophils Absolute: 0.6 10*3/uL — ABNORMAL HIGH (ref 0.0–0.5)
Eosinophils Relative: 6 %
Immature Granulocytes: 1 %
Lymphocytes Relative: 28 %
Lymphs Abs: 2.9 10*3/uL (ref 0.7–4.0)
Monocytes Absolute: 0.6 10*3/uL (ref 0.1–1.0)
Monocytes Relative: 5 %
Neutro Abs: 6.2 10*3/uL (ref 1.7–7.7)
Neutrophils Relative %: 59 %

## 2022-01-06 LAB — I-STAT CHEM 8, ED
BUN: 16 mg/dL (ref 6–20)
Calcium, Ion: 1.22 mmol/L (ref 1.15–1.40)
Chloride: 104 mmol/L (ref 98–111)
Creatinine, Ser: 1.6 mg/dL — ABNORMAL HIGH (ref 0.61–1.24)
Glucose, Bld: 175 mg/dL — ABNORMAL HIGH (ref 70–99)
HCT: 44 % (ref 39.0–52.0)
Hemoglobin: 15 g/dL (ref 13.0–17.0)
Potassium: 4.5 mmol/L (ref 3.5–5.1)
Sodium: 138 mmol/L (ref 135–145)
TCO2: 25 mmol/L (ref 22–32)

## 2022-01-06 LAB — CBC
HCT: 42.2 % (ref 39.0–52.0)
Hemoglobin: 14 g/dL (ref 13.0–17.0)
MCH: 30.4 pg (ref 26.0–34.0)
MCHC: 33.2 g/dL (ref 30.0–36.0)
MCV: 91.7 fL (ref 80.0–100.0)
Platelets: 280 10*3/uL (ref 150–400)
RBC: 4.6 MIL/uL (ref 4.22–5.81)
RDW: 14 % (ref 11.5–15.5)
WBC: 10.3 10*3/uL (ref 4.0–10.5)
nRBC: 0 % (ref 0.0–0.2)

## 2022-01-06 LAB — HEMOGLOBIN A1C
Hgb A1c MFr Bld: 9.5 % — ABNORMAL HIGH (ref 4.8–5.6)
Mean Plasma Glucose: 225.95 mg/dL

## 2022-01-06 LAB — LITHIUM LEVEL
Lithium Lvl: 1.99 mmol/L (ref 0.60–1.20)
Lithium Lvl: 1.82 mmol/L (ref 0.60–1.20)
Lithium Lvl: 0.84 mmol/L (ref 0.60–1.20)
Lithium Lvl: 0.39 mmol/L — ABNORMAL LOW (ref 0.60–1.20)

## 2022-01-06 LAB — SARS CORONAVIRUS 2 BY RT PCR: SARS Coronavirus 2 by RT PCR: NEGATIVE

## 2022-01-06 LAB — URINALYSIS, ROUTINE W REFLEX MICROSCOPIC
Bilirubin Urine: NEGATIVE
Glucose, UA: NEGATIVE mg/dL
Hgb urine dipstick: NEGATIVE
Ketones, ur: NEGATIVE mg/dL
Leukocytes,Ua: NEGATIVE
Nitrite: NEGATIVE
Protein, ur: NEGATIVE mg/dL
Specific Gravity, Urine: 1.01 (ref 1.005–1.030)
pH: 6 (ref 5.0–8.0)

## 2022-01-06 LAB — RAPID URINE DRUG SCREEN, HOSP PERFORMED
Amphetamines: NOT DETECTED
Barbiturates: NOT DETECTED
Benzodiazepines: NOT DETECTED
Cocaine: NOT DETECTED
Opiates: NOT DETECTED
Tetrahydrocannabinol: NOT DETECTED

## 2022-01-06 LAB — TSH: TSH: 2.402 u[IU]/mL (ref 0.350–4.500)

## 2022-01-06 LAB — AMMONIA: Ammonia: 36 umol/L — ABNORMAL HIGH (ref 9–35)

## 2022-01-06 LAB — APTT: aPTT: 31 seconds (ref 24–36)

## 2022-01-06 LAB — ACETAMINOPHEN LEVEL: Acetaminophen (Tylenol), Serum: <10 ug/mL — ABNORMAL LOW (ref 10–30)

## 2022-01-06 LAB — PROTIME-INR
INR: 1 (ref 0.8–1.2)
Prothrombin Time: 13.1 seconds (ref 11.4–15.2)

## 2022-01-06 LAB — CBG MONITORING, ED
Glucose-Capillary: 173 mg/dL — ABNORMAL HIGH (ref 70–99)
Glucose-Capillary: 127 mg/dL — ABNORMAL HIGH (ref 70–99)

## 2022-01-06 LAB — ETHANOL: Alcohol, Ethyl (B): <10 mg/dL (ref <10)

## 2022-01-06 LAB — T4, FREE: Free T4: 0.66 ng/dL (ref 0.61–1.12)

## 2022-01-06 MED ORDER — ANTICOAGULANT SODIUM CITRATE 4% (200MG/5ML) IV SOLN
5.0000 mL | Status: DC | PRN
  Filled 2022-01-06: qty 5

## 2022-01-06 MED ORDER — CHLORHEXIDINE GLUCONATE CLOTH 2 % EX PADS
6.0000 | MEDICATED_PAD | Freq: Every day | CUTANEOUS | Status: DC
  Administered 2022-01-07 – 2022-01-08 (×2): 6 via TOPICAL

## 2022-01-06 MED ORDER — HEPARIN SODIUM (PORCINE) 1000 UNIT/ML IJ SOLN
INTRAMUSCULAR | Status: AC
  Filled 2022-01-06: qty 3

## 2022-01-06 MED ORDER — ACETAMINOPHEN 325 MG PO TABS: 650.0000 mg | ORAL_TABLET | Freq: Four times a day (QID) | ORAL | Status: DC | PRN

## 2022-01-06 MED ORDER — ALTEPLASE 2 MG IJ SOLR: 2.0000 mg | Freq: Once | INTRAMUSCULAR | Status: DC | PRN

## 2022-01-06 MED ORDER — SODIUM CHLORIDE 0.9 % IV BOLUS
1000.0000 mL | Freq: Once | INTRAVENOUS | Status: AC
  Administered 2022-01-06: 1000 mL via INTRAVENOUS

## 2022-01-06 MED ORDER — INSULIN DETEMIR 100 UNIT/ML ~~LOC~~ SOLN
20.0000 [IU] | Freq: Two times a day (BID) | SUBCUTANEOUS | Status: DC
  Administered 2022-01-07 (×2): 20 [IU] via SUBCUTANEOUS
  Filled 2022-01-06 (×6): qty 0.2

## 2022-01-06 MED ORDER — INSULIN ASPART 100 UNIT/ML IJ SOLN
0.0000 [IU] | Freq: Three times a day (TID) | INTRAMUSCULAR | Status: DC
  Administered 2022-01-07: 11 [IU] via SUBCUTANEOUS
  Administered 2022-01-07 – 2022-01-08 (×3): 7 [IU] via SUBCUTANEOUS
  Administered 2022-01-08: 15 [IU] via SUBCUTANEOUS

## 2022-01-06 MED ORDER — ENOXAPARIN SODIUM 40 MG/0.4ML IJ SOSY
40.0000 mg | PREFILLED_SYRINGE | INTRAMUSCULAR | Status: DC
  Administered 2022-01-07 – 2022-01-08 (×2): 40 mg via SUBCUTANEOUS
  Filled 2022-01-06 (×3): qty 0.4

## 2022-01-06 MED ORDER — HEPARIN SODIUM (PORCINE) 1000 UNIT/ML DIALYSIS
1000.0000 [IU] | INTRAMUSCULAR | Status: DC | PRN
  Administered 2022-01-06: 1000 [IU]
  Filled 2022-01-06: qty 1

## 2022-01-06 MED ORDER — ASPIRIN 81 MG PO CHEW
81.0000 mg | CHEWABLE_TABLET | Freq: Every day | ORAL | Status: DC
  Administered 2022-01-07 – 2022-01-08 (×2): 81 mg via ORAL
  Filled 2022-01-06 (×3): qty 1

## 2022-01-06 MED ORDER — ACETAMINOPHEN 650 MG RE SUPP: 650.0000 mg | Freq: Four times a day (QID) | RECTAL | Status: DC | PRN

## 2022-01-06 MED ORDER — ATENOLOL 25 MG PO TABS
12.5000 mg | ORAL_TABLET | Freq: Every day | ORAL | Status: DC
  Administered 2022-01-07 – 2022-01-08 (×2): 12.5 mg via ORAL
  Filled 2022-01-06 (×2): qty 1

## 2022-01-06 MED ORDER — SODIUM CHLORIDE 0.9 % IV SOLN: INTRAVENOUS | Status: AC

## 2022-01-06 MED ORDER — AMLODIPINE BESYLATE 10 MG PO TABS
10.0000 mg | ORAL_TABLET | Freq: Every day | ORAL | Status: DC
  Administered 2022-01-07 – 2022-01-08 (×2): 10 mg via ORAL
  Filled 2022-01-06 (×2): qty 1
  Filled 2022-01-06: qty 2

## 2022-01-06 NOTE — ED Provider Notes (Signed)
[facility] EMERGENCY DEPARTMENT Provider Note   CSN: [id number] Arrival date & time: 01/06/22  1030  An emergency department physician performed an initial assessment on this suspected stroke patient at 1032.  History  Chief Complaint  Patient presents with   Code Stroke    Mitchell [name] is a 61 y.o. male with history of schizophrenia, hypertension, diabetes, on lithium, clonidine, clozapine, atenolol, and insulin, presenting to the ED from assisted living facility with concern for slurred speech.  Patient was last noted well around 8 PM yesterday evening.  He was noted to have slurred speech this morning.  Brought in by EMS.  Patient appears intoxicated or altered on arrival provide much history, other than stated his name.  He arrives as a code stroke.  Neurology present at the bedside.  HPI     Home Medications Prior to Admission medications   Medication Sig Start Date End Date Taking? Authorizing Provider  aspirin EC 81 MG EC tablet Take 1 tablet (81 mg total) by mouth daily. Swallow whole. 05/28/21  Yes Pashayan, Redgie Grayer, MD  atenolol (TENORMIN) 25 MG tablet Take 0.5 tablets (12.5 mg total) by mouth daily. 05/28/21 01/06/22 Yes Pashayan, Redgie Grayer, MD  cloNIDine (CATAPRES) 0.2 MG tablet Take 0.2 mg by mouth 2 (two) times daily.   Yes [provider]  cloZAPine (CLOZARIL) 25 MG tablet Take 3 tablets (75 mg total) by mouth at bedtime. Patient taking differently: Take 50 mg by mouth at bedtime. 05/27/21 01/06/22 Yes Pashayan, Redgie Grayer, MD  donepezil (ARICEPT) 5 MG tablet Take 1 tablet (5 mg total) by mouth at bedtime. 05/27/21 01/06/22 Yes Pashayan, Redgie Grayer, MD  haloperidol (HALDOL) 10 MG tablet Take 15 mg by mouth at bedtime. 06/05/21  Yes [provider]  haloperidol (HALDOL) 5 MG tablet Take 1 tablet (5 mg total) by mouth daily. 05/28/21 01/06/22 Yes Pashayan, Redgie Grayer, MD  insulin aspart protamine - aspart (NOVOLOG MIX 70/30 FLEXPEN)  (70-30) 100 UNIT/ML FlexPen Inject 48 Units into the skin daily with breakfast. Patient taking differently: Inject 7 Units into the skin 3 (three) times daily with meals. 03/02/21 01/06/22 Yes France Ravens, MD  insulin glargine (LANTUS) 100 UNIT/ML injection Inject 15 Units into the skin 2 (two) times daily.   Yes [provider]  lithium carbonate (ESKALITH) 450 MG CR tablet Take 1 tablet (450 mg total) by mouth every 12 (twelve) hours. Patient taking differently: Take 450 mg by mouth at bedtime. 05/27/21 01/06/22 Yes Pashayan, Redgie Grayer, MD  metFORMIN (GLUCOPHAGE) 1000 MG tablet Take 1,000 mg by mouth 2 (two) times daily. 05/11/21  Yes [provider]  senna (SENOKOT) 8.6 MG TABS tablet Take 2 tablets (17.2 mg total) by mouth daily. 05/28/21  Yes Pashayan, Redgie Grayer, MD  amLODipine (NORVASC) 10 MG tablet Take 1 tablet (10 mg total) by mouth at bedtime. 05/27/21 06/26/21  Briant Cedar, MD  cloZAPine (CLOZARIL) 100 MG tablet Take 100 mg by mouth daily. 10/01/21   [provider]  haloperidol (HALDOL) 5 MG tablet Take 3 tablets (15 mg total) by mouth at bedtime. 05/27/21 06/26/21  Briant Cedar, MD  insulin aspart (NOVOLOG) 100 UNIT/ML injection Inject 0-9 Units into the skin 3 (three) times daily with meals. 04/23/21   Lucky Rathke, FNP  insulin aspart protamine- aspart (NOVOLOG MIX 70/30) (70-30) 100 UNIT/ML injection Inject 0.42 mLs (42 Units total) into the skin daily with supper. 04/23/21   Lucky Rathke, FNP  lisinopril (  ZESTRIL) 20 MG tablet Take 1 tablet (20 mg total) by mouth at bedtime. 05/27/21 06/26/21  Briant Cedar, MD      Allergies    Patient has no known allergies.    Review of Systems   Review of Systems  Physical Exam Updated Vital Signs BP (!) 172/146   Pulse 85   Temp 98.1 F (36.7 C) (Oral)   Resp 15   Ht 5\' 4"  (1.626 m)   Wt 90.7 kg   SpO2 100%   BMI 34.33 kg/m  Physical Exam Constitutional:      General: He is not in acute  distress.    Comments: Eyes closed, mumbling  HENT:     Head: Normocephalic and atraumatic.  Eyes:     Conjunctiva/sclera: Conjunctivae normal.     Pupils: Pupils are equal, round, and reactive to light.     Comments: Pupils pinpoint bilaterally  Cardiovascular:     Rate and Rhythm: Normal rate and regular rhythm.  Pulmonary:     Effort: Pulmonary effort is normal. No respiratory distress.  Abdominal:     General: There is no distension.     Tenderness: There is no abdominal tenderness.  Skin:    General: Skin is warm and dry.  Neurological:     Mental Status: He is alert.     Comments: Bilateral intermittent jerking movements of the upper arms, asterixis Patient is able to move all extremities to command.     ED Results / Procedures / Treatments   Labs (all labs ordered are listed, but only abnormal results are displayed) Labs Reviewed  DIFFERENTIAL - Abnormal; Notable for the following components:      Result Value   Eosinophils Absolute 0.6 (*)    All other components within normal limits  COMPREHENSIVE METABOLIC PANEL - Abnormal; Notable for the following components:   Glucose, Bld 179 (*)    Creatinine, Ser 1.68 (*)    Total Protein 8.3 (*)    Alkaline Phosphatase 170 (*)    GFR, Estimated 46 (*)    All other components within normal limits  LITHIUM LEVEL - Abnormal; Notable for the following components:   Lithium Lvl 1.99 (*)    All other components within normal limits  HEMOGLOBIN A1C - Abnormal; Notable for the following components:   Hgb A1c MFr Bld 9.5 (*)    All other components within normal limits  CBG MONITORING, ED - Abnormal; Notable for the following components:   Glucose-Capillary 173 (*)    All other components within normal limits  I-STAT CHEM 8, ED - Abnormal; Notable for the following components:   Creatinine, Ser 1.60 (*)    Glucose, Bld 175 (*)    All other components within normal limits  CBG MONITORING, ED - Abnormal; Notable for the  following components:   Glucose-Capillary 127 (*)    All other components within normal limits  SARS CORONAVIRUS 2 BY RT PCR  ETHANOL  PROTIME-INR  APTT  CBC  RAPID URINE DRUG SCREEN, HOSP PERFORMED  URINALYSIS, ROUTINE W REFLEX MICROSCOPIC  CLOZAPINE (CLOZARIL)  LITHIUM LEVEL  ACETAMINOPHEN LEVEL  AMMONIA  T4, FREE  TSH  HEPATITIS B SURFACE ANTIGEN  HEPATITIS B SURFACE ANTIBODY,QUALITATIVE  HEPATITIS B SURFACE ANTIBODY, QUANTITATIVE  HEPATITIS B CORE ANTIBODY, TOTAL  HEPATITIS C ANTIBODY    EKG EKG Interpretation  Date/Time:  Wednesday January 06 2022 10:56:11 EDT Ventricular Rate:  85 PR Interval:  187 QRS Duration: 100 QT Interval:  376 QTC Calculation:  448 R Axis:   33 Text Interpretation: Sinus rhythm Low voltage, precordial leads Confirmed by Octaviano Glow [phone]) on 01/06/2022 11:11:51 AM  Radiology CT HEAD CODE STROKE WO CONTRAST  Result Date: 01/06/2022 CLINICAL DATA:  Code stroke.  Bilateral weakness and slurred speech EXAM: CT HEAD WITHOUT CONTRAST TECHNIQUE: Contiguous axial images were obtained from the base of the skull through the vertex without intravenous contrast. RADIATION DOSE REDUCTION: This exam was performed according to the departmental dose-optimization program which includes automated exposure control, adjustment of the mA and/or kV according to patient size and/or use of iterative reconstruction technique. COMPARISON:  CT head 01/09/2019 FINDINGS: Brain: There is no acute intracranial hemorrhage, extra-axial fluid collection, or acute infarct. Parenchymal volume is normal. The ventricles are normal in size. Gray-white differentiation is preserved. Patchy hypodensity in the supratentorial white matter likely reflects sequela of chronic small vessel ischemic change. There is no mass lesion.  There is no mass effect or midline shift. Vascular: No dense vessel is seen. Skull: Normal. Negative for fracture or focal lesion. Sinuses/Orbits: There is mild  mucosal thickening in the paranasal sinuses. Bilateral lens implants are in place. The globes and orbits are otherwise unremarkable. Other: There is trace fluid in the right mastoid air cells. Soft tissue density in the external auditory canals, right more than left, likely reflects cerumen. ASPECTS [facility] Stroke Program Early CT Score) - Ganglionic level infarction (caudate, lentiform nuclei, internal capsule, insula, M1-M3 cortex): 7 - Supraganglionic infarction (M4-M6 cortex): 3 Total score (0-10 with 10 being normal): 10 IMPRESSION: 1. No acute intracranial pathology. 2. ASPECTS is 10 These results were paged via AMION at the time of interpretation on 01/06/2022 at 10:48 am to provider Bhagat. Electronically Signed   By: Valetta Mole M.D.   On: 01/06/2022 10:49    Procedures .Critical Care  Performed by: Wyvonnia Dusky, MD Authorized by: Wyvonnia Dusky, MD   Critical care provider statement:    Critical care time (minutes):  55   Critical care time was exclusive of:  Separately billable procedures and treating other patients   Critical care was necessary to treat or prevent imminent or life-threatening deterioration of the following conditions:  Toxidrome   Critical care was time spent personally by me on the following activities:  Ordering and performing treatments and interventions, ordering and review of laboratory studies, ordering and review of radiographic studies, pulse oximetry, review of old charts, examination of patient and evaluation of patient's response to treatment   Care discussed with: admitting provider   Comments:     IV fluid management of lithium overdose, consultation with several specialist including nephrology and critical care.     Medications Ordered in ED Medications  0.9 %  sodium chloride infusion ( Intravenous New Bag/Given 01/06/22 1355)  insulin aspart (novoLOG) injection 0-20 Units (has no administration in time range)  insulin detemir (LEVEMIR)  injection 20 Units (has no administration in time range)  amLODipine (NORVASC) tablet 10 mg (has no administration in time range)  aspirin chewable tablet 81 mg (has no administration in time range)  atenolol (TENORMIN) tablet 12.5 mg (has no administration in time range)  Chlorhexidine Gluconate Cloth 2 % PADS 6 each (has no administration in time range)  sodium chloride 0.9 % bolus 1,000 mL (0 mLs Intravenous Stopped 01/06/22 1355)    ED Course/ Medical Decision Making/ A&P Clinical Course as of 01/06/22 1522  Wed Jan 06, 2022  1204 Lithium(!!): 1.99 [MT]  1204 IV fluids ordered.  I suspect myoclonus is related to elevated lithium level.  I do not believe he is requiring hemodialysis at this time, as his mental status appears to be improving, will monitor closely. [MT]  [place] control recommends fluid bolus and 200 cc/fluid per hour NS, repeat lithium every 4 hours. Given patient's waxning and waning mental status, I will discuss case with nephrologist.  A page is sent out [MT]  1310 Dr Marval Regal from nephrology will consult on patient, recommends critical care consultation and possible ICU admit for monitoring - no immediate indication for dialysis but may require later if pt deteriorates.  I have paged [facility] now. [MT]  [phone] Raol from critical care consulted. [MT]  1413 ICU team has evaluated patient and we are awaiting repeat Li levels and further assessment to determine admission level of care. [MT]  [id number] ICU team to place temporary dialysis line and are planning for CRV, but requesting stepdown admission instead of ICU.  We will consult hospital team [MT]  1521 Admitted to IM service [MT]    Clinical Course User Index [MT] Odell Choung, Carola Rhine, MD                           Medical Decision Making Amount and/or Complexity of Data Reviewed Labs: ordered. Decision-making details documented in ED Course. Radiology: ordered.  Risk Prescription drug management. Decision  regarding hospitalization.   This patient presents to the Emergency Department with complaint of altered mental status.  This involves an extensive number of treatment options, and is a complaint that carries with it a high risk of complications and morbidity.  The differential diagnosis includes hypoglycemia vs metabolic encephalopathy vs infection (including cystitis) vs ICH vs stroke vs polypharmacy vs other  Patient arrives as a code stroke and neurology was consulted on arrival.  Subsequent CT head does not show any acute stroke, and agree with neurologist that this presentation is more consistent with a metabolic encephalopathy.  Further recommendations from neurology service is pending.  I ordered, reviewed, and interpreted labs, including elevated lithium levels.  Creatinine mildly elevated I ordered medication IV fluid bolus and IV fluid infusion for lithium toxicity I ordered imaging studies which included CT of the head I independently visualized and interpreted imaging which showed no cute infarct and the monitor tracing which showed sinus rhythm Additional history was obtained from EMS on arrival External records obtained and reviewed showing patient has a history of lithium toxicity, medical records show that he is prescribed lithium I personally reviewed the patients ECG which showed sinus rhythm with no acute ischemic findings  I consulted neurology and discussed lab and imaging findings. I also consulted critical care team and nephrology team.  Please see ED course.  After the interventions stated above, I reevaluated the patient and found mental status is overall stable.         Final Clinical Impression(s) / ED Diagnoses Final diagnoses:  Lithium toxicity, accidental or unintentional, initial encounter    Rx / DC Orders ED Discharge Orders     None         Angy Swearengin, Carola Rhine, MD 01/06/22 [phone]

## 2022-01-06 NOTE — Progress Notes (Signed)
Arrived to pt room pt having procedure will try back as schedule permits

## 2022-01-06 NOTE — Progress Notes (Signed)
Spoke with the patient's group home manager, Rodena Piety. She is able to be reached by the phone number listed in the chart as the patient's personal number.    She states that the patient was in his normal state of health the day prior to admission. She did not notice any abnormal movements of his legs or arms. She notes this AM that he said he was not feeling well. The staff eventually found him foaming at the mouth and about to pass out. EMS was called at that time. She states that patient does not know his medications and at baseline he does not communicate much. He mostly responds yes or no. She does state that his lithium dose was recently changed by his psychiatric provider, Zerita Boers.   She states that our team can reach her in the AM to get his medication list. We will also reach out to Zerita Boers FNP to discuss his psychiatric medications.

## 2022-01-06 NOTE — Procedures (Signed)
Central Venous Catheter Insertion Procedure Note  Mitchell [name]  [id number]  [birth year]  Date:01/06/22  Time:2:58 PM   Provider Performing:Reginald Weida Shearon Stalls   Procedure: Insertion of Non-tunneled Central Venous Catheter(36556)with US guidance ([id number])    Indication(s) Hemodialysis  Consent Risks of the procedure as well as the alternatives and risks of each were explained to the patient and/or caregiver.  Consent for the procedure was obtained and is signed in the bedside chart  Anesthesia Topical only with 1% lidocaine   Timeout Verified patient identification, verified procedure, site/side was marked, verified correct patient position, special equipment/implants available, medications/allergies/relevant history reviewed, required imaging and test results available.  Sterile Technique Maximal sterile technique including full sterile barrier drape, hand hygiene, sterile gown, sterile gloves, mask, hair covering, sterile ultrasound probe cover (if used).  Procedure Description Area of catheter insertion was cleaned with chlorhexidine and draped in sterile fashion.   With real-time ultrasound guidance a HD catheter was placed into the right internal jugular vein.  Nonpulsatile blood flow and easy flushing noted in all ports.  The catheter was sutured in place and sterile dressing applied.  Complications/Tolerance None; patient tolerated the procedure well. Chest X-ray is ordered to verify placement for internal jugular or subclavian cannulation.  Chest x-ray is not ordered for femoral cannulation.  EBL Minimal  Specimen(s) None   Montey Hora, PA - [facility] For pager details, please see AMION or use Epic chat  After 1900, please call [place] for cross coverage needs 01/06/2022, 2:58 PM

## 2022-01-06 NOTE — ED Notes (Signed)
Pt will answer some questions but he will doze off during conversation. Pt will wake up and repeat the answer to the questions asked.

## 2022-01-06 NOTE — Consult Note (Signed)
Reason for Consult: Lithium toxicity Referring Physician: Langston Masker, MD  Mitchell [name] is an 61 y.o. male with a past medical history significant for DM, HTN, h/o stroke with left arm weakness, OSA, and bipolar disorder/schizoaffective disorder on chronic lithium who was noted [facility] that he was altered and having slurred speach.  EMS called and found to have fluctuating weakness of upper extremities.  He was also confused.  In ED VSS and code stroke called and seen by neurology.  CT scan of head was negative for acute intracranial pathology.  Labs notable for Cr 1.6, glucose 179, Hgb 14, WBC 10.3, and lithium level 1.99.  Neuro felt that lithium toxicity was the likely source of his acute metabolic encephalopathy and we were consulted for consideration of dialysis for lithium removal.    Pt is awake and answers simple questions.  Denies any N/V/D, or taking extra lithium pills.  He denies any SOB or lower extremity edema.    Trend in Creatinine: Creatinine, Ser  Date/Time Value Ref Range Status  01/06/2022 11:02 AM 1.60 (H) 0.61 - 1.24 mg/dL Final  01/06/2022 10:52 AM 1.68 (H) 0.61 - 1.24 mg/dL Final  05/27/2021 06:13 AM 1.24 0.61 - 1.24 mg/dL Final  05/26/2021 06:40 PM 1.59 (H) 0.61 - 1.24 mg/dL Final  05/15/2021 06:30 PM 1.27 (H) 0.61 - 1.24 mg/dL Final  05/08/2021 06:30 PM 1.28 (H) 0.61 - 1.24 mg/dL Final  05/01/2021 06:31 AM 1.09 0.61 - 1.24 mg/dL Final  04/28/2021 07:08 PM 1.38 (H) 0.61 - 1.24 mg/dL Final  04/24/2021 06:32 PM 1.49 (H) 0.61 - 1.24 mg/dL Final  04/21/2021 11:57 PM 1.49 (H) 0.61 - 1.24 mg/dL Final  04/16/2021 08:15 PM 1.61 (H) 0.61 - 1.24 mg/dL Final  03/02/2021 06:25 AM 1.16 0.61 - 1.24 mg/dL Final  02/27/2021 06:24 AM 1.18 0.61 - 1.24 mg/dL Final  02/26/2021 06:41 AM 1.13 0.61 - 1.24 mg/dL Final  02/23/2021 06:44 AM 1.10 0.61 - 1.24 mg/dL Final  02/10/2021 06:42 AM 1.08 0.61 - 1.24 mg/dL Final  02/07/2021 06:41 AM 1.16 0.61 - 1.24 mg/dL Final   02/04/2021 07:10 AM 1.11 0.61 - 1.24 mg/dL Final  01/31/2021 06:34 AM 1.16 0.61 - 1.24 mg/dL Final  01/30/2021 06:19 AM 1.29 (H) 0.61 - 1.24 mg/dL Final  01/26/2021 03:59 PM 1.45 (H) 0.61 - 1.24 mg/dL Final  01/21/2021 04:26 PM 1.39 (H) 0.61 - 1.24 mg/dL Final  01/08/2021 12:15 PM 1.00 0.61 - 1.24 mg/dL Final  10/17/2020 03:14 PM 1.18 0.61 - 1.24 mg/dL Final  07/24/2020 10:39 AM 1.17 0.61 - 1.24 mg/dL Final  01/10/2020 10:13 PM 1.19 0.61 - 1.24 mg/dL Final  02/13/2019 02:48 AM 1.05 0.61 - 1.24 mg/dL Final  02/10/2019 01:05 PM 0.90 0.61 - 1.24 mg/dL Final  01/18/2019 03:17 PM 1.03 0.61 - 1.24 mg/dL Final  01/03/2019 07:40 PM 1.07 0.61 - 1.24 mg/dL Final  11/27/2018 01:23 AM 1.25 (H) 0.61 - 1.24 mg/dL Final  07/18/2018 11:45 AM 1.33 (H) 0.61 - 1.24 mg/dL Final  12/31/2017 09:36 AM 1.18 0.61 - 1.24 mg/dL Final  06/26/2017 12:00 PM 1.41 (H) 0.61 - 1.24 mg/dL Final  07/18/2015 09:04 PM 1.21 0.61 - 1.24 mg/dL Final  10/03/2014 04:57 AM 1.26 (H) 0.61 - 1.24 mg/dL Final  10/02/2014 05:30 AM 1.29 (H) 0.61 - 1.24 mg/dL Final  10/01/2014 11:57 PM 1.36 (H) 0.61 - 1.24 mg/dL Final  06/07/2013 09:53 AM 1.02 0.50 - 1.35 mg/dL Final  05/11/2013 10:06 AM 0.97 0.50 - 1.35  mg/dL Final  35/68/6168 37:29 PM 1.05 0.50 - 1.35 mg/dL Final  05/09/1550 08:02 PM 1.10 0.50 - 1.35 mg/dL Final  23/36/1224 49:75 AM 1.01 0.50 - 1.35 mg/dL Final  30/07/1100 11:17 AM 0.96 0.50 - 1.35 mg/dL Final  35/67/0141 03:01 PM 1.09 0.50 - 1.35 mg/dL Final  31/43/8887 57:97 AM 0.99 0.50 - 1.35 mg/dL Final  28/20/6015 61:53 AM 0.99 0.50 - 1.35 mg/dL Final  79/43/2761 47:09 AM 1.07 0.50 - 1.35 mg/dL Final  29/57/4734 03:70 AM 0.88 0.50 - 1.35 mg/dL Final  96/43/8381 84:03 AM 0.92 0.50 - 1.35 mg/dL Final  75/43/6067 70:34 PM 0.96 0.50 - 1.35 mg/dL Final  03/52/4818 59:09 AM 0.80 0.50 - 1.35 mg/dL Final    PMH:   Past Medical History:  Diagnosis Date   Bipolar affective disorder (HCC)    takes Zyprexa daily   Coarse  tremors 10/02/2014   Diabetes mellitus    takes Victoza,Metformin,and Glipizide daily   Hypertension    takes Amlodipine,Lisinopril and Clonidine daily   Hyponatremia    history of   Lithium toxicity 10/02/2014   Mental disorder    takes Lithium daily   Schizoaffective disorder    takes Trazodone nightly   Schizoaffective disorder (HCC) 01/04/2019   Seasonal allergies    takes Claritin daily   Sleep apnea    sleep study >67yrs ago   Stroke [facility])    left arm weakness    PSH:   Past Surgical History:  Procedure Laterality Date   CATARACT EXTRACTION W/PHACO Right 02/14/2013   Procedure: CATARACT EXTRACTION PHACO AND INTRAOCULAR LENS PLACEMENT (IOC);  Surgeon: Shade Flood, MD;  Location: [facility] OR;  Service: Ophthalmology;  Laterality: Right;   CATARACT EXTRACTION W/PHACO Left 06/13/2013   Procedure: CATARACT EXTRACTION PHACO AND INTRAOCULAR LENS PLACEMENT (IOC);  Surgeon: Shade Flood, MD;  Location[facility] OR;  Service: Ophthalmology;  Laterality: Left;   CIRCUMCISION  20 yrs. ago   EYE SURGERY      Allergies: No Known Allergies  Medications:   Prior to Admission medications   Medication Sig Start Date End Date Taking? Authorizing Provider  aspirin EC 81 MG EC tablet Take 1 tablet (81 mg total) by mouth daily. Swallow whole. 05/28/21  Yes Pashayan, Mardelle Matte, MD  amLODipine (NORVASC) 10 MG tablet Take 1 tablet (10 mg total) by mouth at bedtime. 05/27/21 06/26/21  Lauro Franklin, MD  atenolol (TENORMIN) 25 MG tablet Take 0.5 tablets (12.5 mg total) by mouth daily. 05/28/21 06/27/21  Lauro Franklin, MD  cloNIDine (CATAPRES) 0.2 MG tablet Take 0.2 mg by mouth 2 (two) times daily. Patient not taking: Reported on 01/26/2021    [provider]  cloZAPine (CLOZARIL) 25 MG tablet Take 3 tablets (75 mg total) by mouth at bedtime. 05/27/21 06/26/21  Lauro Franklin, MD  donepezil (ARICEPT) 5 MG tablet Take 1 tablet (5 mg total) by mouth at bedtime. 05/27/21 06/26/21  Lauro Franklin, MD  haloperidol (HALDOL) 10 MG tablet Take by mouth. 06/05/21   [provider]  haloperidol (HALDOL) 5 MG tablet Take 1 tablet (5 mg total) by mouth daily. 05/28/21 06/27/21  Lauro Franklin, MD  haloperidol (HALDOL) 5 MG tablet Take 3 tablets (15 mg total) by mouth at bedtime. 05/27/21 06/26/21  Lauro Franklin, MD  insulin aspart (NOVOLOG) 100 UNIT/ML injection Inject 0-9 Units into the skin 3 (three) times daily with meals. 04/23/21   Lenard Lance, FNP  insulin aspart protamine - aspart (NOVOLOG MIX  70/30 FLEXPEN) (70-30) 100 UNIT/ML FlexPen Inject 48 Units into the skin daily with breakfast. 03/02/21 04/02/21  Park Pope, MD  insulin aspart protamine- aspart (NOVOLOG MIX 70/30) (70-30) 100 UNIT/ML injection Inject 0.42 mLs (42 Units total) into the skin daily with supper. 04/23/21   Lenard Lance, FNP  lisinopril (ZESTRIL) 20 MG tablet Take 1 tablet (20 mg total) by mouth at bedtime. 05/27/21 06/26/21  Lauro Franklin, MD  lithium carbonate (ESKALITH) 450 MG CR tablet Take 1 tablet (450 mg total) by mouth every 12 (twelve) hours. 05/27/21 06/26/21  Lauro Franklin, MD  metFORMIN (GLUCOPHAGE) 1000 MG tablet Take 1,000 mg by mouth 2 (two) times daily. 05/11/21   [provider]  senna (SENOKOT) 8.6 MG TABS tablet Take 2 tablets (17.2 mg total) by mouth daily. 05/28/21   Lauro Franklin, MD    Inpatient medications:  amLODipine  10 mg Oral Daily   aspirin  81 mg Oral Daily   [START ON 01/07/2022] atenolol  12.5 mg Oral Daily   [START ON 01/07/2022] Chlorhexidine Gluconate Cloth  6 each Topical Q0600   insulin aspart  0-20 Units Subcutaneous TID WC   insulin detemir  20 Units Subcutaneous BID    Discontinued Meds:  There are no discontinued medications.  Social History:  reports that he has never smoked. He has never used smokeless tobacco. He reports that he does not currently use alcohol. He reports that he does not use drugs.  Family History:   History reviewed. No pertinent family history.  Pertinent items are noted in HPI. Weight change:  No intake or output data in the 24 hours ending 01/06/22 1449 BP (!) 172/146   Pulse 85   Temp 98.1 F (36.7 C) (Oral)   Resp 15   Ht 5\' 4"  (1.626 m)   Wt 90.7 kg   SpO2 100%   BMI 34.33 kg/m  Vitals:   01/06/22 1230 01/06/22 1300 01/06/22 1307 01/06/22 1330  BP: (!) 116/93 (!) 120/56  (!) 172/146  Pulse: 74 87  85  Resp: (!) 22 16  15   Temp:      TempSrc:      SpO2: 100% 100%  100%  Weight:   90.7 kg   Height:   5\' 4"  (1.626 m)      General appearance: cooperative, no distress, and slowed mentation Head: Normocephalic, without obvious abnormality, atraumatic Resp: clear to auscultation bilaterally Cardio: regular rate and rhythm, S1, S2 normal, no murmur, click, rub or gallop GI: distended abdomen, +BS, soft, non-tender Extremities: extremities normal, atraumatic, no cyanosis or edema Neuro:  + myoclonic jerking of upper extremities  Labs: Basic Metabolic Panel: Recent Labs  Lab 01/06/22 1052 01/06/22 1102  NA 137 138  K 4.6 4.5  CL 105 104  CO2 26  --   GLUCOSE 179* 175*  BUN 13 16  CREATININE 1.68* 1.60*  ALBUMIN 4.1  --   CALCIUM 9.9  --    Liver Function Tests: Recent Labs  Lab 01/06/22 1052  AST 27  ALT 31  ALKPHOS 170*  BILITOT 0.6  PROT 8.3*  ALBUMIN 4.1   No results for input(s): "LIPASE", "AMYLASE" in the last 168 hours. No results for input(s): "AMMONIA" in the last 168 hours. CBC: Recent Labs  Lab 01/06/22 1052 01/06/22 1102  WBC 10.3  --   NEUTROABS 6.2  --   HGB 14.0 15.0  HCT 42.2 44.0  MCV 91.7  --   PLT 280  --  PT/INR: @LABRCNTIP (inr:5) Cardiac Enzymes: )No results for input(s): "CKTOTAL", "CKMB", "CKMBINDEX", "TROPONINI" in the last 168 hours. CBG: Recent Labs  Lab 01/06/22 1033  GLUCAP 173*    Iron Studies: No results for input(s): "IRON", "TIBC", "TRANSFERRIN", "FERRITIN" in the last 168 hours.  Xrays/Other  Studies: CT HEAD CODE STROKE WO CONTRAST  Result Date: 01/06/2022 CLINICAL DATA:  Code stroke.  Bilateral weakness and slurred speech EXAM: CT HEAD WITHOUT CONTRAST TECHNIQUE: Contiguous axial images were obtained from the base of the skull through the vertex without intravenous contrast. RADIATION DOSE REDUCTION: This exam was performed according to the departmental dose-optimization program which includes automated exposure control, adjustment of the mA and/or kV according to patient size and/or use of iterative reconstruction technique. COMPARISON:  CT head 01/09/2019 FINDINGS: Brain: There is no acute intracranial hemorrhage, extra-axial fluid collection, or acute infarct. Parenchymal volume is normal. The ventricles are normal in size. Gray-white differentiation is preserved. Patchy hypodensity in the supratentorial white matter likely reflects sequela of chronic small vessel ischemic change. There is no mass lesion.  There is no mass effect or midline shift. Vascular: No dense vessel is seen. Skull: Normal. Negative for fracture or focal lesion. Sinuses/Orbits: There is mild mucosal thickening in the paranasal sinuses. Bilateral lens implants are in place. The globes and orbits are otherwise unremarkable. Other: There is trace fluid in the right mastoid air cells. Soft tissue density in the external auditory canals, right more than left, likely reflects cerumen. ASPECTS [facility] Stroke Program Early CT Score) - Ganglionic level infarction (caudate, lentiform nuclei, internal capsule, insula, M1-M3 cortex): 7 - Supraganglionic infarction (M4-M6 cortex): 3 Total score (0-10 with 10 being normal): 10 IMPRESSION: 1. No acute intracranial pathology. 2. ASPECTS is 10 These results were paged via AMION at the time of interpretation on 01/06/2022 at 10:48 am to provider Bhagat. Electronically Signed   By: 03/08/2022 M.D.   On: 01/06/2022 10:49     Assessment/Plan:  Lithium toxicity - pt with chronic  consumption of lithium with acute mental status changes.  Even though the lithium level is mildly elevated, he has evidence of severe symptoms with AMS and myoclonus.  Discussed case with PCCM.  Agree with IVF's and will have PCCM place HD catheter in ED and then have urgent extended HD in the [facility].  Will need to follow lithium levels every 4 hours and after HD to avoid rebound.  He may require CRRT if lithium levels continue to rise after HD for more prolonged clearance.  Currently hemodynamically stable and able to go to [facility] with telemetry.  He will require SDU admission after HD to be closely monitored. Acute encephalopathy - as above AKI - continue with IVF's and possible cause of lithium toxicity or result of.  Will continue to hold lisinopril. Schizophrenia, bipolar affective disorder - admission to [facility] from January to March 2023 with acute psychosis. DM - hold metformin and insulin for now.  Continue to follow.    April 2023 Dane Bloch 01/06/2022, 2:49 PM

## 2022-01-06 NOTE — Consult Note (Signed)
Neurology Consultation Reason for Consult: Code stroke Requesting Physician: Alvester Chou  CC: Altered mental status  History is obtained from: EMS and chart review  HPI: Mitchell [name] is a 61 y.o. male with a past medical history significant for bipolar disorder/schizoaffective disorder on lithium and Clozaril, tremors, diabetes, hypertension, sleep apnea.  EMS additionally reported a history of seizures but I see no documented history of seizures in his chart  He was most recently admitted from April 23, 2021 through May 27, 2021 to behavioral health for treatment of schizophrenia exacerbated by selective medication adherence, noted to be psychotic.  There was concern for cognitive impairment with low Moca and MMSE scores and failure of pillbox testing with OT for which she was started on Aricept 5 mg and he was [facility] discharge for help with IADLs and medication management  Per EMS, this morning he was noted to have slurred speech for which they were called out.  On EMS arrival he was noted to initially have some fluctuating weakness worse in the left upper extremity than in the right upper extremity and then both.  He was also not consistently naming objects for them although they did not feel like his speech was slurred when talking to EMS  LKW: 8 PM on 10/10 Thrombolytic given?: No, out of the window IA performed?: No, exam not consistent with an LVO Premorbid modified rankin scale:      3 - Moderate disability. Requires some help, but able to walk unassisted.  ROS: Unable to obtain due to altered mental status.   Past Medical History:  Diagnosis Date   Bipolar affective disorder (HCC)    takes Zyprexa daily   Coarse tremors 10/02/2014   Diabetes mellitus    takes Victoza,Metformin,and Glipizide daily   Hypertension    takes Amlodipine,Lisinopril and Clonidine daily   Hyponatremia    history of   Lithium toxicity 10/02/2014   Mental disorder     takes Lithium daily   Schizoaffective disorder    takes Trazodone nightly   Schizoaffective disorder (HCC) 01/04/2019   Seasonal allergies    takes Claritin daily   Sleep apnea    sleep study >80yrs ago   Stroke [facility])    left arm weakness   Past Surgical History:  Procedure Laterality Date   CATARACT EXTRACTION W/PHACO Right 02/14/2013   Procedure: CATARACT EXTRACTION PHACO AND INTRAOCULAR LENS PLACEMENT (IOC);  Surgeon: Shade Flood, MD;  Location: [facility] OR;  Service: Ophthalmology;  Laterality: Right;   CATARACT EXTRACTION W/PHACO Left 06/13/2013   Procedure: CATARACT EXTRACTION PHACO AND INTRAOCULAR LENS PLACEMENT (IOC);  Surgeon: Shade Flood, MD;  Location: [facility] OR;  Service: Ophthalmology;  Laterality: Left;   CIRCUMCISION  20 yrs. ago   EYE SURGERY     Current Outpatient Medications  Medication Instructions   amLODipine (NORVASC) 10 mg, Oral, Daily at bedtime   aspirin EC 81 mg, Oral, Daily, Swallow whole.   atenolol (TENORMIN) 12.5 mg, Oral, Daily   cloNIDine (CATAPRES) 0.2 mg, 2 times daily   cloZAPine (CLOZARIL) 75 mg, Oral, Daily at bedtime   donepezil (ARICEPT) 5 mg, Oral, Daily at bedtime   haloperidol (HALDOL) 10 MG tablet Oral   haloperidol (HALDOL) 5 mg, Oral, Daily   haloperidol (HALDOL) 15 mg, Oral, Daily at bedtime   insulin aspart (NOVOLOG) 0-9 Units, Subcutaneous, 3 times daily with meals   insulin aspart protamine - aspart (NOVOLOG MIX 70/30 FLEXPEN) (70-30) 100 UNIT/ML FlexPen 48 Units, Subcutaneous,  Daily with breakfast   insulin aspart protamine- aspart (NOVOLOG MIX 70/30) (70-30) 100 UNIT/ML injection 42 Units, Subcutaneous, Daily with supper   lisinopril (ZESTRIL) 20 mg, Oral, Daily at bedtime   lithium carbonate (ESKALITH) 450 mg, Oral, Every 12 hours   metFORMIN (GLUCOPHAGE) 1,000 mg, Oral, 2 times daily   senna (SENOKOT) 17.2 mg, Oral, Daily     History reviewed. No pertinent family history.  Social History:  reports that he has never smoked. He has  never used smokeless tobacco. He reports that he does not currently use alcohol. He reports that he does not use drugs.  Exam: Current vital signs: BP 110/74 (BP Location: Right Arm)   Pulse 83   Temp 98.1 F (36.7 C) (Oral)   Resp 16   SpO2 100%  Vital signs in last 24 hours: Temp:  [98.1 F (36.7 C)] 98.1 F (36.7 C) (10/11 1045) Pulse Rate:  [83] 83 (10/11 1045) Resp:  [16] 16 (10/11 1045) BP: (110)/(74) 110/74 (10/11 1045) SpO2:  [100 %] 100 % (10/11 1045)   Physical Exam  Constitutional: Appears mildly ill Psych: Affect minimally interactive, flat Eyes: No scleral injection HENT: No oropharyngeal obstruction.  MSK: no joint deformities.  Cardiovascular: Normal rate and regular rhythm.  Respiratory: Effort normal, non-labored breathing GI: Protuberant but nontender and soft skin: Warm dry and intact visible skin  Neuro: Mental Status: Patient is awake, alert, oriented to age but not month, unable to give any details of situation. Language testing is limited by poor attention and perseveration but he is able to name thumb and finger.  He does follow some simple commands Cranial Nerves: II: Visual Fields are full to orienting to stimuli in all visual fields. Pupils are equal, round, and reactive to light.  1.5 mm to 1 mm III,IV, VI: EOMI without ptosis or diploplia, very saccadic pursuits and possibly some nystagmus but difficult to assess given his poor attention V: Facial sensation is symmetric to light eyelash brush VII: Facial movement is notable for possible right facial droop VIII: hearing is intact to voice X: Uvula elevates symmetrically XI: Shoulder shrug is symmetric. XII: tongue is midline without atrophy or fasciculations.  Motor: Tone is normal but at times he has some slight paratonia.  Significant asterixis in the bilateral upper extremities predominantly but also in the lower extremities (which are more difficult to assess due to poor effort in the lower  extremities compared to the uppers).  His arms do not drift fully to the bed but his legs do quickly drift to the bed Sensory: Able to accurately report with examiner's touching both legs and reports that the sensation is symmetric in arms and legs to light touch Deep Tendon Reflexes: Diminished, difficult to assess due to his slight paratonia Plantars: Toes are mute bilaterally Cerebellar: FNF and HKS are intact bilaterally within limits of his asterixis Gait:  Deferred in acute setting   NIHSS total 9 Score breakdown: 1 point for drowsiness, one-point for not answering month, one-point for right facial droop, one-point for left arm weakness one-point for right arm weakness, 2 points for left leg weakness, 2 points for right leg weakness Performed at time of patient arrival to ED    I have reviewed labs in epic and the results pertinent to this consultation are:  Basic Metabolic Panel: Recent Labs  Lab 01/06/22 1102  NA 138  K 4.5  CL 104  GLUCOSE 175*  BUN 16  CREATININE 1.60*  Baseline creatinine 1.2 - 1.6  CBC: Recent Labs  Lab 01/06/22 1052 01/06/22 1102  WBC 10.3  --   NEUTROABS 6.2  --   HGB 14.0 15.0  HCT 42.2 44.0  MCV 91.7  --   PLT 280  --     Coagulation Studies: No results for input(s): "LABPROT", "INR" in the last 72 hours.    Lithium level on 05/27/2021 was 0.72    I have reviewed the images obtained: Head CT personally reviewed, agree with radiology that there is no acute intracranial process though there is some chronic microvascular disease which can mask an acute process   Impression: Overall the patient's examination is most consistent with a toxic/metabolic process at this time, and I have low concern for a large vessel occlusion based on his examination.  Recommendations: -MRI brain without contrast -Routine EEG -Lithium and Clozaril levels -Additional toxic/metabolic work-up per ED; CMP is pending at the time of this note -Neurology  will follow-up MRI and EEG but if these are reassuring we will be available as needed going forward  Brooke Dare MD-PhD [facility] [phone] Available 7 AM to 7 PM, outside these hours please contact Neurologist on call listed on AMION   CRITICAL CARE Performed by: Gordy Councilman   Total critical care time: 35 minutes  Critical care time was exclusive of separately billable procedures and treating other patients.  Critical care was necessary to treat or prevent imminent or life-threatening deterioration.  Critical care was time spent personally by me on the following activities: development of treatment plan with patient and/or surrogate as well as nursing, discussions with consultants, evaluation of patient's response to treatment, examination of patient, obtaining history from patient or surrogate, ordering and performing treatments and interventions, ordering and review of laboratory studies, ordering and review of radiographic studies, pulse oximetry and re-evaluation of patient's condition.

## 2022-01-06 NOTE — Plan of Care (Addendum)
Lithium level is elevated at 1.99, which typically low level of toxicity. Can hold off on MRI now given this likely explains his symptoms Will obtain EEG, and based on next lithium level, consider LTM Advised ED provider to reach out to poison control for recommendations on frequency of lithium level checks and any other recommendations  Lesleigh Noe MD-PhD [facility] [phone]

## 2022-01-06 NOTE — H&P (Signed)
NAME:  Mitchell [name], MRN:  [id number], DOB:  1960-08-20, LOS: 0 ADMISSION DATE:  01/06/2022, CONSULTATION DATE:  01/06/22 REFERRING MD:  Renaye Rakers CHIEF COMPLAINT:  AMS   History of Present Illness:  Mitchell [name] is a 61 y.o. male who has a PMH as below including but not limited to bipolar disorder/schizoaffective disorder on Lithium and Clozaril, Tremors, DM, HTN, OSA. He had recent admission 04/23/21 through 05/27/21 to [facility] for schizophrenia and psychosis. He was discharged to [facility].  On 10/11, staff noted that he was altered and had dysarthria. On EMS arrival, he had fluctuating weakness or upper extremities. He was brought to ED as code stroke; however imaging was negative and presentation seemed to be more c/w metabolic encephalopathy per neuro eval.  His lithium level returned at 1.99 which was likely explanation for his symptoms. His case was discussed with poison control (by EDP) who recommended Lithium level checks q4-6 hours and repeat EKG in the next few hours (initial negative) along with nephrology consultation incase levels continued to increase to the point he would require dialysis. Given his encephalopathy and possibly falsely low Lithium level, PCCM called for admission to ICU.  Pertinent  Medical History:  has Hyponatremia; Hypertension; Type 2 diabetes mellitus (HCC); Sleep apnea; and Schizophrenia (HCC) on their problem list.  Significant Hospital Events: Including procedures, antibiotic start and stop dates in addition to other pertinent events   10/11 admit.  Interim History / Subjective:  Awake, asking for food. Does have tremor and asterixis.  Objective:  Blood pressure (!) 120/56, pulse 87, temperature 98.1 F (36.7 C), temperature source Oral, resp. rate 16, height 5\' 4"  (1.626 m), weight 90.7 kg, SpO2 100 %.       No intake or output data in the 24 hours ending 01/06/22 1347 Filed Weights   01/06/22 1307  Weight: 90.7 kg    Examination: General:  Adult male, resting in bed, in NAD. Neuro: Awake, slightly confused but speaks appropriately and is asking for food. MAE's but does have asterixis. HEENT: [place]/AT. Sclerae anicteric. EOMI. Cardiovascular: RRR, no M/R/G.  Lungs: Respirations even and unlabored.  CTA bilaterally, No W/R/R. Abdomen: BS x 4, soft, NT/ND.  Musculoskeletal: No gross deformities, no edema.  Skin: Intact, warm, no rashes.  Labs/imaging personally reviewed:  CT head 10/11 > negative, ASPECTS 10.  Assessment & Plan:   Acute encephalopathy presumed 2/2 Lithium Toxicity - initial Lithium level 1.99; however, per poison control, this could be falsely low given chronic Lithium use. - Repeat Lithium level pending. - Nephrology has requested HD catheter placement for HD. - Trend Lithium levels q4 hours x 3. - Repeat EKG in 4 hours. - Supportive care.  AoCKD. - Continue fluids. - Follow BMP.  Hx HTN. - Continue home Amlodipine, ASA, Atenolol. - Hold home Lisinopril.  Hx Schizophrenia, Bipolar Affective Disorder, Tremors, CVA. - Hold home Clozapine, Donepezil, Haldol, Lithium.  Hx DM. - SSI if glucose consistently > 180. - Hold home Metformin, Novolog.   Best practice (evaluated daily):  Diet/type: Regular consistency (see orders) DVT prophylaxis: prophylactic heparin  GI prophylaxis: N/A Lines: N/A Foley:  N/A Code Status:  full code Last date of multidisciplinary goals of care discussion: None yet.  Labs   CBC: Recent Labs  Lab 01/06/22 1052 01/06/22 1102  WBC 10.3  --   NEUTROABS 6.2  --   HGB 14.0 15.0  HCT 42.2 44.0  MCV 91.7  --   PLT 280  --  Basic Metabolic Panel: Recent Labs  Lab 01/06/22 1052 01/06/22 1102  NA 137 138  K 4.6 4.5  CL 105 104  CO2 26  --   GLUCOSE 179* 175*  BUN 13 16  CREATININE 1.68* 1.60*  CALCIUM 9.9  --    GFR: Estimated Creatinine Clearance: 49.9 mL/min (A) (by C-G formula based on SCr of 1.6 mg/dL (H)). Recent Labs  Lab 01/06/22 1052  WBC  10.3    Liver Function Tests: Recent Labs  Lab 01/06/22 1052  AST 27  ALT 31  ALKPHOS 170*  BILITOT 0.6  PROT 8.3*  ALBUMIN 4.1   No results for input(s): "LIPASE", "AMYLASE" in the last 168 hours. No results for input(s): "AMMONIA" in the last 168 hours.  ABG    Component Value Date/Time   TCO2 25 01/06/2022 1102     Coagulation Profile: Recent Labs  Lab 01/06/22 1052  INR 1.0    Cardiac Enzymes: No results for input(s): "CKTOTAL", "CKMB", "CKMBINDEX", "TROPONINI" in the last 168 hours.  HbA1C: Hgb A1c MFr Bld  Date/Time Value Ref Range Status  04/24/2021 06:32 PM 9.2 (H) 4.8 - 5.6 % Final    Comment:    (NOTE) Pre diabetes:          5.7%-6.4%  Diabetes:              >6.4%  Glycemic control for   <7.0% adults with diabetes   01/26/2021 03:59 PM 11.1 (H) 4.8 - 5.6 % Final    Comment:    (NOTE) Pre diabetes:          5.7%-6.4%  Diabetes:              >6.4%  Glycemic control for   <7.0% adults with diabetes     CBG: Recent Labs  Lab 01/06/22 1033  GLUCAP 173*    Review of Systems:   Unable to obtain as pt is encephalopathic.  Past Medical History:  He,  has a past medical history of Bipolar affective disorder ([place]), Coarse tremors (10/02/2014), Diabetes mellitus, Hypertension, Hyponatremia, Lithium toxicity (10/02/2014), Mental disorder, Schizoaffective disorder, Schizoaffective disorder ([place]) (01/04/2019), Seasonal allergies, Sleep apnea, and Stroke ([place]).   Surgical History:   Past Surgical History:  Procedure Laterality Date   CATARACT EXTRACTION W/PHACO Right 02/14/2013   Procedure: CATARACT EXTRACTION PHACO AND INTRAOCULAR LENS PLACEMENT (IOC);  Surgeon: Adonis Brook, MD;  Location: [place];  Service: Ophthalmology;  Laterality: Right;   CATARACT EXTRACTION W/PHACO Left 06/13/2013   Procedure: CATARACT EXTRACTION PHACO AND INTRAOCULAR LENS PLACEMENT (IOC);  Surgeon: Adonis Brook, MD;  Location: [place];  Service: Ophthalmology;  Laterality: Left;    CIRCUMCISION  20 yrs. ago   EYE SURGERY       Social History:   reports that he has never smoked. He has never used smokeless tobacco. He reports that he does not currently use alcohol. He reports that he does not use drugs.   Family History:  His family history is not on file.   Allergies No Known Allergies   Home Medications  Prior to Admission medications   Medication Sig Start Date End Date Taking? Authorizing Provider  aspirin EC 81 MG EC tablet Take 1 tablet (81 mg total) by mouth daily. Swallow whole. 05/28/21  Yes Pashayan, Redgie Grayer, MD  amLODipine (NORVASC) 10 MG tablet Take 1 tablet (10 mg total) by mouth at bedtime. 05/27/21 06/26/21  Briant Cedar, MD  atenolol (TENORMIN) 25 MG tablet Take 0.5 tablets (12.5  mg total) by mouth daily. 05/28/21 06/27/21  Briant Cedar, MD  cloNIDine (CATAPRES) 0.2 MG tablet Take 0.2 mg by mouth 2 (two) times daily. Patient not taking: Reported on 01/26/2021    [provider]  cloZAPine (CLOZARIL) 25 MG tablet Take 3 tablets (75 mg total) by mouth at bedtime. 05/27/21 06/26/21  Briant Cedar, MD  donepezil (ARICEPT) 5 MG tablet Take 1 tablet (5 mg total) by mouth at bedtime. 05/27/21 06/26/21  Briant Cedar, MD  haloperidol (HALDOL) 10 MG tablet Take by mouth. 06/05/21   [provider]  haloperidol (HALDOL) 5 MG tablet Take 1 tablet (5 mg total) by mouth daily. 05/28/21 06/27/21  Briant Cedar, MD  haloperidol (HALDOL) 5 MG tablet Take 3 tablets (15 mg total) by mouth at bedtime. 05/27/21 06/26/21  Briant Cedar, MD  insulin aspart (NOVOLOG) 100 UNIT/ML injection Inject 0-9 Units into the skin 3 (three) times daily with meals. 04/23/21   Lucky Rathke, FNP  insulin aspart protamine - aspart (NOVOLOG MIX 70/30 FLEXPEN) (70-30) 100 UNIT/ML FlexPen Inject 48 Units into the skin daily with breakfast. 03/02/21 04/02/21  France Ravens, MD  insulin aspart protamine- aspart (NOVOLOG MIX 70/30) (70-30) 100  UNIT/ML injection Inject 0.42 mLs (42 Units total) into the skin daily with supper. 04/23/21   Lucky Rathke, FNP  lisinopril (ZESTRIL) 20 MG tablet Take 1 tablet (20 mg total) by mouth at bedtime. 05/27/21 06/26/21  Briant Cedar, MD  lithium carbonate (ESKALITH) 450 MG CR tablet Take 1 tablet (450 mg total) by mouth every 12 (twelve) hours. 05/27/21 06/26/21  Briant Cedar, MD  metFORMIN (GLUCOPHAGE) 1000 MG tablet Take 1,000 mg by mouth 2 (two) times daily. 05/11/21   [provider]  senna (SENOKOT) 8.6 MG TABS tablet Take 2 tablets (17.2 mg total) by mouth daily. 05/28/21   Briant Cedar, MD     Critical care time: 30 min.   Montey Hora, [facility] For pager details, please see AMION or use Epic chat  After 1900, please call [facility] for cross coverage needs 01/06/2022, 1:47 PM

## 2022-01-06 NOTE — Code Documentation (Signed)
[name] is a 61 year old male arriving to [facility] via EMS on 01/06/2022 for evaluation of dysarthria and weakness. He has a PMH of prior CVA and seizures. He is on no thinners. [facility] from [name] where he was last known well last night at 2000. This morning staff noted him to be weak and dysarthric, and EMS was called. Code stroke alert activated by EMS.    Stroke team at bedside on patient's arrival. Cbg obtained, airway cleared by EDP. (Difficult stick, will defer labs to after imaging). Pt to CT with team. NIHSS 10 for bilateral weakness, dysarthria drowsiness and rt facial droop. (Please see documentation for details).     CT non-con was obtained, which was negative for acute hemorrhage per Dr. Curly [place]ores. Pt is not a candidate for thrombolytic as he is OOW. Pt not candidate for Mechanical Thrombectomy as clinical exam is LVO negative. He was returned to room 32 where his labs were obtained. He will need q 2 hr VS and NIHSS while his workup continues. Bedside handoff with Larene Beach RN complete.

## 2022-01-06 NOTE — H&P (Signed)
Date: 01/06/2022               Patient Name:  Mitchell [name] MRN: [id number]  DOB: Aug 15, 1960 Age / Sex: 61 y.o., male   PCP: Pcp, No              Medical Service: Internal Medicine Teaching Service              Attending Physician: Dr. Heide Spark    First Contact: Dr. Lajuana Ripple Pager: [phone]  Second Contact: Dr. Doran Stabler Pager: [phone]            After Hours (After 5p/  First Contact Pager: [phone]  weekends / holidays):      Chief Complaint: Altered Mental Status  History of Present Illness: Mitchell [name] is a 61 year old male with a past medical history of bipolar/schizophrenia on lithium/clozapine, history of lithium toxicity, HTN, T2DM, OSA, stroke with residual left arm weakness, and tremors who presented to the ED this morning with AMS.   Patient recently admitted from January - March 2023 to behavioral health for treatment of schizophrenia complicated by medication non-adherence. At this time, there was a concern for cognitive impairment so patient was started on Aricept and discharged to a family care home to assist with IADLS.   Patient was last seen acting normally last night around 8 pm and this morning was found to have slurred speech so EMS was called. Upon EMS arrival, patient had slurred speech, fluctuating cognition, and weakness most prominent in left upper extremity.   Patient arrived in the ED and code stroke was called. Patient exhibited AMS and myoclonus. CT head showed no acute stroke, ECG showed sinus rhythm with no acute ischemic findings, and upon lab evaluation, was noted to have elevated lithium levels in addition to AKI. Both ED physician and neurologist believed symptoms were consistent with metabolic encephalopathy. Patient given IVF's and Nephrology was subsequently consulted and stated that, despite lithium levels being only mildly elevated (1.99), patient's overall presentation was consistent with lithium toxicity. PCCM placed HD catheter and patient  will begin dialysis with q4 lithium monitoring per nephrology.  Patient is alert but with fluctuating cognition. Patient states that a "nurse" gives him his medicine at the family care home. He is unaware of when he last took his medicine and is unable to provide details surrounding the events. Patient denies dyspnea, chest pain, abdominal discomfort, or any recent change in bowel/urinary habits (although difficult to determine if he is comprehending the questioning fully).   Meds:  Current Meds  Medication Sig   amLODipine (NORVASC) 10 MG tablet Take 1 tablet (10 mg total) by mouth at bedtime.   aspirin EC 81 MG EC tablet Take 1 tablet (81 mg total) by mouth daily. Swallow whole.   atenolol (TENORMIN) 25 MG tablet Take 0.5 tablets (12.5 mg total) by mouth daily.   cloNIDine (CATAPRES) 0.2 MG tablet Take 0.2 mg by mouth 2 (two) times daily.   cloZAPine (CLOZARIL) 25 MG tablet Take 3 tablets (75 mg total) by mouth at bedtime. (Patient taking differently: Take 25-75 mg by mouth See admin instructions. Taking 25 mg in the AM and 75 mg at bedtime)   donepezil (ARICEPT) 5 MG tablet Take 1 tablet (5 mg total) by mouth at bedtime.   haloperidol (HALDOL) 10 MG tablet Take 15 mg by mouth at bedtime. 1 & 1/2 tabs = 15 mg   haloperidol (HALDOL) 5 MG tablet Take 1 tablet (5 mg total) by mouth daily.  insulin aspart (NOVOLOG) 100 UNIT/ML injection Inject 0-9 Units into the skin 3 (three) times daily with meals. (Patient taking differently: Inject 7 Units into the skin 3 (three) times daily with meals.)   insulin glargine (LANTUS) 100 UNIT/ML injection Inject 15 Units into the skin 2 (two) times daily.   lisinopril (ZESTRIL) 20 MG tablet Take 1 tablet (20 mg total) by mouth at bedtime.   lithium carbonate (ESKALITH) 450 MG CR tablet Take 1 tablet (450 mg total) by mouth every 12 (twelve) hours. (Patient taking differently: Take 450 mg by mouth 2 (two) times daily.)   metFORMIN (GLUCOPHAGE) 1000 MG tablet Take  1,000 mg by mouth 2 (two) times daily.   senna (SENOKOT) 8.6 MG TABS tablet Take 2 tablets (17.2 mg total) by mouth daily.     Allergies: Allergies as of 01/06/2022   (No Known Allergies)   Past Medical History:  Diagnosis Date   Bipolar affective disorder ([place])    takes Zyprexa daily   Coarse tremors 10/02/2014   Diabetes mellitus    takes Victoza,Metformin,and Glipizide daily   Hypertension    takes Amlodipine,Lisinopril and Clonidine daily   Hyponatremia    history of   Lithium toxicity 10/02/2014   Mental disorder    takes Lithium daily   Schizoaffective disorder    takes Trazodone nightly   Schizoaffective disorder ([place]) 01/04/2019   Seasonal allergies    takes Claritin daily   Sleep apnea    sleep study >71yrs ago   Stroke [facility])    left arm weakness    Family History: Patient unable to provide family history. Patient does state he has "many children."  Social History: denies tobacco, alcohol, and illegal drug use  Review of Systems: A complete ROS was negative except as per HPI.   Physical Exam: Blood pressure (!) 127/117, pulse 94, temperature 98.1 F (36.7 C), temperature source Oral, resp. rate (!) 21, height 5\' 4"  (1.626 m), weight 90.7 kg, SpO2 94 %.  General: sitting in chair in acute distress Head: normocephalic, atraumatic Cardiovascular: regular rate and rhythm without murmurs, rubs, or gallops, no JVD, no edema of bilateral lower extremities  Respiratory: normal respiratory effort, lungs CTAB Abdominal: Distended but soft, no tenderness to palpation Skin: warm, dry, well-perfused Neurological: alert and oriented x 3, myoclonus in bilateral upper extremities and bilateral  Psychological: Normal mood and flat affect    EKG: personally reviewed my interpretation is normal sinus rhythm  CXR: personally reviewed my interpretation is no acute findings  Assessment & Plan by Problem: Active Problems:   Lithium toxicity  1. Lithium Toxicity Patient has  HD catheter placed and is currently undergoing HD. Nephrology following. Poison control consulted and recommended q 4 lithium level checks. PC further stated that lithium level should be checked 6-8 hours after dialysis and, if > 1, patient should undergo second HD. BMP ordered q 12. His TSH was WNL on admission.  -F/u with vitals, BMP, and q 4 lithium levels  2. AKI Patient presented with creatinine of 1.68. Baseline is 1.32. Suspect this is the result of lithium toxicity. Patient received IVF's and undergoing HD. -F/u with BMP  3. Hypertension BP elevated at 144/106. Unclear home regimen, though listed in chart as Amlodipine 10 mg and Atenolol 12.5 mg. Will clarify home antihypertensive regimen with group home.  -CTM  4. T2DM Patient BG of 173 with A1C of 9.5 on admission. Will discuss with group home antidiabetic regimen, SSI for now.   5. Schizoaffective disorder/Bipolar  disorder Patient not having any psychotic or manic symptoms currently. Will follow up with patient's group home regarding his regimen. Will continue to hold lithium. Does have outpatient psychiatrist will reach out to them or possibly our inpatient psychiatric team for further guidance on his medication regimen as he will need alternative to lithium.      Dispo: Admit patient to Observation with expected length of stay less than 2 midnights.  Signed: Carmina Miller, Medical Student 01/06/2022, 4:15 PM  Pager: [phone]  Attestation for Student Documentation:  I personally was present and performed or re-performed the history, physical exam and medical decision-making activities of this service and have verified that the service and findings are accurately documented in the student's note.  Marolyn Haller, MD 01/07/2022, 10:08 AM

## 2022-01-07 ENCOUNTER — Inpatient Hospital Stay (HOSPITAL_COMMUNITY): Payer: Medicare Other

## 2022-01-07 DIAGNOSIS — R4182 Altered mental status, unspecified: Secondary | ICD-10-CM | POA: Diagnosis not present

## 2022-01-07 DIAGNOSIS — T56891A Toxic effect of other metals, accidental (unintentional), initial encounter: Secondary | ICD-10-CM | POA: Diagnosis not present

## 2022-01-07 LAB — COMPREHENSIVE METABOLIC PANEL
ALT: 26 U/L (ref 0–44)
AST: 21 U/L (ref 15–41)
Albumin: 3.5 g/dL (ref 3.5–5.0)
Alkaline Phosphatase: 158 U/L — ABNORMAL HIGH (ref 38–126)
Anion gap: 8 (ref 5–15)
BUN: <5 mg/dL — ABNORMAL LOW (ref 6–20)
CO2: 27 mmol/L (ref 22–32)
Calcium: 8.2 mg/dL — ABNORMAL LOW (ref 8.9–10.3)
Chloride: 98 mmol/L (ref 98–111)
Creatinine, Ser: 0.86 mg/dL (ref 0.61–1.24)
GFR, Estimated: >60 mL/min (ref >60)
Glucose, Bld: 194 mg/dL — ABNORMAL HIGH (ref 70–99)
Potassium: 3.3 mmol/L — ABNORMAL LOW (ref 3.5–5.1)
Sodium: 133 mmol/L — ABNORMAL LOW (ref 135–145)
Total Bilirubin: 0.3 mg/dL (ref 0.3–1.2)
Total Protein: 6.9 g/dL (ref 6.5–8.1)

## 2022-01-07 LAB — CBC
HCT: 34.2 % — ABNORMAL LOW (ref 39.0–52.0)
Hemoglobin: 12.2 g/dL — ABNORMAL LOW (ref 13.0–17.0)
MCH: 31 pg (ref 26.0–34.0)
MCHC: 35.7 g/dL (ref 30.0–36.0)
MCV: 86.8 fL (ref 80.0–100.0)
Platelets: 223 10*3/uL (ref 150–400)
RBC: 3.94 MIL/uL — ABNORMAL LOW (ref 4.22–5.81)
RDW: 13.7 % (ref 11.5–15.5)
WBC: 8.7 10*3/uL (ref 4.0–10.5)
nRBC: 0 % (ref 0.0–0.2)

## 2022-01-07 LAB — GLUCOSE, CAPILLARY
Glucose-Capillary: 177 mg/dL — ABNORMAL HIGH (ref 70–99)
Glucose-Capillary: 231 mg/dL — ABNORMAL HIGH (ref 70–99)
Glucose-Capillary: 207 mg/dL — ABNORMAL HIGH (ref 70–99)
Glucose-Capillary: 257 mg/dL — ABNORMAL HIGH (ref 70–99)
Glucose-Capillary: 154 mg/dL — ABNORMAL HIGH (ref 70–99)

## 2022-01-07 LAB — MAGNESIUM: Magnesium: 1.7 mg/dL (ref 1.7–2.4)

## 2022-01-07 LAB — HEPATITIS B SURFACE ANTIBODY,QUALITATIVE

## 2022-01-07 LAB — LITHIUM LEVEL
Lithium Lvl: 0.55 mmol/L — ABNORMAL LOW (ref 0.60–1.20)
Lithium Lvl: 0.69 mmol/L (ref 0.60–1.20)
Lithium Lvl: 0.68 mmol/L (ref 0.60–1.20)
Lithium Lvl: 0.69 mmol/L (ref 0.60–1.20)

## 2022-01-07 LAB — HEPATITIS B SURFACE ANTIBODY, QUANTITATIVE: Hep B S AB Quant (Post): <3.1 m[IU]/mL — ABNORMAL LOW (ref >9.9)

## 2022-01-07 LAB — CLOZAPINE (CLOZARIL)
Clozapine Lvl: 486 ng/mL (ref 350–600)
NorClozapine: 147 ng/mL
Total(Cloz+Norcloz): 633 ng/mL

## 2022-01-07 LAB — HIV ANTIBODY (ROUTINE TESTING W REFLEX): HIV Screen 4th Generation wRfx: NONREACTIVE

## 2022-01-07 LAB — PHOSPHORUS: Phosphorus: 2 mg/dL — ABNORMAL LOW (ref 2.5–4.6)

## 2022-01-07 MED ORDER — POTASSIUM CHLORIDE CRYS ER 20 MEQ PO TBCR
40.0000 meq | EXTENDED_RELEASE_TABLET | Freq: Two times a day (BID) | ORAL | Status: AC
  Administered 2022-01-07 (×2): 40 meq via ORAL
  Filled 2022-01-07 (×2): qty 2

## 2022-01-07 NOTE — Progress Notes (Signed)
Neurology Progress Note  Brief HPI: Patient with history of bipolar disorder on lithium, schizoaffective disorder, possible seizures, tremors, diabetes, HTN and sleep apnea was admitted with slurred speech and fluctuating upper extremity weakness.  He was found to have an elevated lithium level at 1.99 and was dialyzed for this.    Subjective: Patient has undergone HD for lithium toxicity and reports that he is doing well this morning.    Exam: Vitals:   01/07/22 0337 01/07/22 0740  BP: 115/64 129/81  Pulse: 84 80  Resp: 13 11  Temp: 98.8 F (37.1 C) 98.5 F (36.9 C)  SpO2: 96% 93%   Gen: In bed, NAD Resp: non-labored breathing, no acute distress Abd: soft, nt  Neuro: Mental Status: drowsy, oriented to self and place as well as age but not time, unable to give details of his situation and requires repeated stimulation to complete exam Cranial Nerves:PERRL, EOMI without nystagmus, face symmetrical, facial sensation symmetrical, phonation normal, hearing intact to voice, shoulder shrug symmetrical and tongue midline Motor: Grossly full strength (too confused to do confrontational strength testing) throughout with some paratonia, especially in legs and recurrent jerking movements of upper and lower extremities.  Coarse hand tremor with hands outstretched, slightly improved from yesterday but still present. Some slightly myoclonic movements of the face and asterixis of bilateral upper > lower extremiteis Sensory:intact to light tough throughout DTR: unable to elicit Gait: Deferred  Pertinent Labs: CBC    Component Value Date/Time   WBC 8.7 01/07/2022 0213   RBC 3.94 (L) 01/07/2022 0213   HGB 12.2 (L) 01/07/2022 0213   HCT 34.2 (L) 01/07/2022 0213   PLT 223 01/07/2022 0213   MCV 86.8 01/07/2022 0213   MCH 31.0 01/07/2022 0213   MCHC 35.7 01/07/2022 0213   RDW 13.7 01/07/2022 0213   LYMPHSABS 2.9 01/06/2022 1052   MONOABS 0.6 01/06/2022 1052   EOSABS 0.6 (H) 01/06/2022 1052    BASOSABS 0.1 01/06/2022 1052       Latest Ref Rng & Units 01/07/2022    2:13 AM 01/06/2022   11:02 AM 01/06/2022   10:52 AM  BMP  Glucose 70 - 99 mg/dL 809  983  382   BUN 6 - 20 mg/dL 5  16  13    Creatinine 0.61 - 1.24 mg/dL 5.05  3.97  6.73   Sodium 135 - 145 mmol/L 133  138  137   Potassium 3.5 - 5.1 mmol/L 3.3  4.5  4.6   Chloride 98 - 111 mmol/L 98  104  105   CO2 22 - 32 mmol/L 27   26   Calcium 8.9 - 10.3 mg/dL 8.2   9.9    Lithium 4.19 at 0530   Imaging Reviewed:  CT head:  No acute abnormality  EEG: Continuous slow, generalized  Assessment: 61 year old patient with history of bipolar disorder on lithium, schizoaffective disorder, possible seizures, tremors, diabetes, HTN and sleep apnea was admitted with slurred speech and fluctuating upper extremity weakness.  Patient was found to have a lithium level of 1.99 and underwent HD overnight for this.  Lithium level is now 0.69, but myoclonic jerking of extremities and coarse had tremors remain.  Of note, lithium toxicity induced tremor can be irreversible or only partly reversible at times.  Patient does have a history of tremors and a prior history of lithium toxicity as well but unsure how severe his movements are at baseline.  EEG read pending  Impression: Lithium toxicity, now resolved, with  residual limb jerking and hand tremors  Recommendations: - PT/OT consult - Given he still remains somnolent and with significant tremors with history of stroke, will obtain MRI brain to confirm no acute intracranial process.  If he is unable to tolerate MRI brain please obtain head CT - Appreciate management of comorbidities per primary team, nephrology and psychiatry - Neurology will follow up imaging but otherwise will be available as needed going forward  [place] , MSN, AGACNP-BC [facility] See Amion for schedule and pager information 01/07/2022 8:36 AM   Attending Neurologist's note:  I personally  saw this patient, gathering history, performing a full neurologic examination, reviewing relevant labs, personally reviewing relevant imaging including head CT, and formulated the assessment and plan, adding the note above for completeness and clarity to accurately reflect my thoughts   Lesleigh Noe MD-PhD [facility] [phone] Available 7 AM to 7 PM, outside these hours please contact Neurologist on call listed on AMION   Discussed with primary team via secure chat

## 2022-01-07 NOTE — Progress Notes (Addendum)
NAME:  Mitchell [name], MRN:  [id number], DOB:  05/23/1960, LOS: 1 ADMISSION DATE:  01/06/2022  Subjective  NAEON/Overnight events: Received HD yesterday.  Patient evaluated at bedside this AM. Patient was sleepy this morning but arousable, reports feeling better than yesterday but unable to specify exactly why. Oriented to person and place only. He was able to follow repeated commands. Explained improvement in lithium levels since his arrival and plan for physical-occupational therapy evaluation.  Objective   Blood pressure 115/64, pulse 84, temperature 98.8 F (37.1 C), temperature source Oral, resp. rate 13, height 5\' 4"  (1.626 m), weight 90.7 kg, SpO2 96 %.     Intake/Output Summary (Last 24 hours) at 01/07/2022 0634 Last data filed at 01/06/2022 2351 Gross per 24 hour  Intake --  Output 0 ml  Net 0 ml   Filed Weights   01/06/22 1307 01/07/22 0222  Weight: 90.7 kg 90.7 kg    Physical Exam: General: African American male lying in bed in no acute distress, somnolent CV: RRR. No m/r/g. Pulm: Normal WOB on RA. CTAB.  Abdomen: Soft, non-tender, non-distended Neuro: Oriented to self and place. Not oriented to situation or time. Pinpoint pupils bilaterally. EOM intact. Tongue midline. 5/5 strength in BUE and BLE. Occasional asterixis noted in BUE and BLE, appears worse on L vs R.  Psych: Unable to assess secondary to mental status.   Labs       Latest Ref Rng & Units 01/07/2022    2:13 AM 01/06/2022   11:02 AM 01/06/2022   10:52 AM  CBC  WBC 4.0 - 10.5 K/uL 8.7   10.3   Hemoglobin 13.0 - 17.0 g/dL 03/08/2022  85.4  62.7   Hematocrit 39.0 - 52.0 % 34.2  44.0  42.2   Platelets 150 - 400 K/uL 223   280       Latest Ref Rng & Units 01/07/2022    2:13 AM 01/06/2022   11:02 AM 01/06/2022   10:52 AM  BMP  Glucose 70 - 99 mg/dL 03/08/2022  009  381   BUN 6 - 20 mg/dL 5  16  13    Creatinine 0.61 - 1.24 mg/dL 829  9.37  1.69   Sodium 135 - 145 mmol/L 133  138  137   Potassium 3.5 -  5.1 mmol/L 3.3  4.5  4.6   Chloride 98 - 111 mmol/L 98  104  105   CO2 22 - 32 mmol/L 27   26   Calcium 8.9 - 10.3 mg/dL 8.2   9.9     Imaging: CT head wnl   Summary   Mitchell [name] is a 61 y.o. male with history of schizophrenia on lithium, clozapine, and haldol, hypertension, T2 diabetes who presented to the ED from assisted living facility as code stroke due to concern for slurred speech and was found to have elevated lithium level.     Assessment & Plan:  Principal Problem:   Lithium toxicity, accidental or unintentional, initial encounter Active Problems:   Lithium toxicity  Acute encephalopathy, suspected to be due to lithium toxicity Patient is s/p 6 hours HD with downtrending lithium levels, currently 0.69. No need for additional HD or fluids. Night team spoke with group home manager, apparently lithium dose recently changed by patient's psychiatric provider. There could also be a component of AKI that contributed to increase in patient's lithium level that then caused his encephalopathy. Patient's home regimen is lithium 450 Q12H. No evidence of seizures on EEG.  -  neurology following, appreciate assistance -nephrology following, appreciate assistance -F/u lithium levels -f/u with group home manager re home meds   Hypokalemia  -replete K  Hypertension BP intermittently elevated with systolics 712W.  -restarted home regimen of Amlodipine 10 mg and Atenolol 12.5 mg    T2DM Patient CBG 231 this AM with A1C of 9.5 on admission.  -started on insulin levemir 20U BID  -resistant SSI   Schizophrenia Admission to [facility] from 03/2021-05/2021 for acute psychosis. There was concern for cognitive impairment and patient with [facility] 13/30, failed pillbox test. Per home manager, patient at baseline does not communicate much and mostly responds yes or no. Patient's discharge meds included clozapine 75 QHS, donepezil 5mg , haldol 15mg  QHS, haldol 5mg  daily, and lithium 450 Q12H.  Patient's home  regimen is listed same in the chart but may have been recently changed by his psychiatric provider, will confirm with group home manager.  -f/u with group home manager  -clozaril level pending  -tylenol level <10.   AKI, resolved  Patient presented with creatinine of 1.68. Baseline is 1.32. Suspect this is the result of lithium toxicity. Patient received IVF's and s/p HD. Currently Cr is 0.86.  -holding lisinopril due to AKI on admission  Best practice:  DIET: regular  DVT PPX: lovenox CODE: FULL   PT/OT recs: pending Dispo: pending Barriers to discharge: medical stability, PT/OT recs   Rolanda Lundborg, MD Internal Medicine Resident PGY-1 PAGER: [phone] 01/07/2022 6:34 AM  If after hours (below), please contact on-call pager: [phone] 5PM-7AM Monday-Friday 1PM-7AM Saturday-Sunday

## 2022-01-07 NOTE — Progress Notes (Signed)
Patient ID: Mitchell [name], male   DOB: 06/28/1960, 61 y.o.   MRN: [id number] S: Tolerated HD well with improvement of MS and myoclonus.  No complaints. O:BP 129/81 (BP Location: Right Arm)   Pulse 80   Temp 98.5 F (36.9 C) (Oral)   Resp 11   Ht 5\' 4"  (1.626 m)   Wt 90.7 kg   SpO2 93%   BMI 34.32 kg/m   Intake/Output Summary (Last 24 hours) at 01/07/2022 1037 Last data filed at 01/07/2022 0900 Gross per 24 hour  Intake --  Output 650 ml  Net -650 ml   Intake/Output: No intake/output data recorded.  Intake/Output this shift:  Total I/O In: -  Out: 650 [Urine:650] Weight change:  Gen: NAD CVS: RRR Resp:CTA Abd: distended, +BS, soft, NT/ND Ext: no edema  Recent Labs  Lab 01/06/22 1052 01/06/22 1102 01/07/22 0213  NA 137 138 133*  K 4.6 4.5 3.3*  CL 105 104 98  CO2 26  --  27  GLUCOSE 179* 175* 194*  BUN 13 16 <5*  CREATININE 1.68* 1.60* 0.86  ALBUMIN 4.1  --  3.5  CALCIUM 9.9  --  8.2*  PHOS  --   --  2.0*  AST 27  --  21  ALT 31  --  26   Liver Function Tests: Recent Labs  Lab 01/06/22 1052 01/07/22 0213  AST 27 21  ALT 31 26  ALKPHOS 170* 158*  BILITOT 0.6 0.3  PROT 8.3* 6.9  ALBUMIN 4.1 3.5   No results for input(s): "LIPASE", "AMYLASE" in the last 168 hours. Recent Labs  Lab 01/06/22 1612  AMMONIA 36*   CBC: Recent Labs  Lab 01/06/22 1052 01/06/22 1102 01/07/22 0213  WBC 10.3  --  8.7  NEUTROABS 6.2  --   --   HGB 14.0 15.0 12.2*  HCT 42.2 44.0 34.2*  MCV 91.7  --  86.8  PLT 280  --  223   Cardiac Enzymes: No results for input(s): "CKTOTAL", "CKMB", "CKMBINDEX", "TROPONINI" in the last 168 hours. CBG: Recent Labs  Lab 01/06/22 1033 01/06/22 1515 01/07/22 0013 01/07/22 0744  GLUCAP 173* 127* 177* 231*    Iron Studies: No results for input(s): "IRON", "TIBC", "TRANSFERRIN", "FERRITIN" in the last 72 hours. Studies/Results: EEG adult  Result Date: 01/07/2022 Lora Havens, MD     01/07/2022  8:50 AM Patient Name:  Mitchell [name] MRN: [id number] Epilepsy Attending: Lora Havens Referring Physician/Provider: Lorenza Chick, MD Date: 01/07/2022 Duration: 27.02 mins Patient history: 61 year old male with altered mental status.  EEG to evaluate for seizure. Level of alertness: Awake AEDs during EEG study: None Technical aspects: This EEG study was done with scalp electrodes positioned according to the 10-20 International system of electrode placement. Electrical activity was reviewed with band pass filter of 1-70Hz , sensitivity of 7 uV/mm, display speed of 18mm/sec with a 60Hz  notched filter applied as appropriate. EEG data were recorded continuously and digitally stored.  Video monitoring was available and reviewed as appropriate. Description: The posterior dominant rhythm consists of 8 Hz activity of moderate voltage (25-35 uV) seen predominantly in posterior head regions, symmetric and reactive to eye opening and eye closing. EEG showed continuous generalized 3 to 6 Hz theta-delta slowing. Hyperventilation and photic stimulation were not performed.   ABNORMALITY - Continuous slow, generalized IMPRESSION: This study is suggestive of mild to moderate diffuse encephalopathy, nonspecific etiology. No seizures or epileptiform discharges were seen throughout the recording. Priyanka Barbra Sarks  DG Chest Port 1 View  Result Date: 01/06/2022 CLINICAL DATA:  Central line placement EXAM: PORTABLE CHEST 1 VIEW COMPARISON:  Chest radiograph 05/26/2021 FINDINGS: Interval placement of a venous catheter in the SVC. No pleural effusion. No pneumothorax. No focal airspace opacity. Unchanged cardiac and mediastinal contours. Visualized upper abdomen is unremarkable. No displaced rib fractures. IMPRESSION: Interval placement of a right-sided central venous catheter. No pneumothorax. Electronically Signed   By: Lorenza Cambridge M.D.   On: 01/06/2022 15:45   CT HEAD CODE STROKE WO CONTRAST  Result Date: 01/06/2022 CLINICAL DATA:  Code  stroke.  Bilateral weakness and slurred speech EXAM: CT HEAD WITHOUT CONTRAST TECHNIQUE: Contiguous axial images were obtained from the base of the skull through the vertex without intravenous contrast. RADIATION DOSE REDUCTION: This exam was performed according to the departmental dose-optimization program which includes automated exposure control, adjustment of the mA and/or kV according to patient size and/or use of iterative reconstruction technique. COMPARISON:  CT head 01/09/2019 FINDINGS: Brain: There is no acute intracranial hemorrhage, extra-axial fluid collection, or acute infarct. Parenchymal volume is normal. The ventricles are normal in size. Gray-white differentiation is preserved. Patchy hypodensity in the supratentorial white matter likely reflects sequela of chronic small vessel ischemic change. There is no mass lesion.  There is no mass effect or midline shift. Vascular: No dense vessel is seen. Skull: Normal. Negative for fracture or focal lesion. Sinuses/Orbits: There is mild mucosal thickening in the paranasal sinuses. Bilateral lens implants are in place. The globes and orbits are otherwise unremarkable. Other: There is trace fluid in the right mastoid air cells. Soft tissue density in the external auditory canals, right more than left, likely reflects cerumen. ASPECTS [facility] Stroke Program Early CT Score) - Ganglionic level infarction (caudate, lentiform nuclei, internal capsule, insula, M1-M3 cortex): 7 - Supraganglionic infarction (M4-M6 cortex): 3 Total score (0-10 with 10 being normal): 10 IMPRESSION: 1. No acute intracranial pathology. 2. ASPECTS is 10 These results were paged via AMION at the time of interpretation on 01/06/2022 at 10:48 am to provider Bhagat. Electronically Signed   By: Lesia Hausen M.D.   On: 01/06/2022 10:49    amLODipine  10 mg Oral Daily   aspirin  81 mg Oral Daily   atenolol  12.5 mg Oral Daily   Chlorhexidine Gluconate Cloth  6 each Topical Q0600    enoxaparin (LOVENOX) injection  40 mg Subcutaneous Q24H   insulin aspart  0-20 Units Subcutaneous TID WC   insulin detemir  20 Units Subcutaneous BID   potassium chloride  40 mEq Oral BID    BMET    Component Value Date/Time   NA 133 (L) 01/07/2022 0213   K 3.3 (L) 01/07/2022 0213   CL 98 01/07/2022 0213   CO2 27 01/07/2022 0213   GLUCOSE 194 (H) 01/07/2022 0213   BUN <5 (L) 01/07/2022 0213   CREATININE 0.86 01/07/2022 0213   CREATININE 1.32 05/20/2020 0000   CALCIUM 8.2 (L) 01/07/2022 0213   GFRNONAA >60 01/07/2022 0213   GFRNONAA 59 (L) 05/20/2020 0000   GFRAA 68 05/20/2020 0000   CBC    Component Value Date/Time   WBC 8.7 01/07/2022 0213   RBC 3.94 (L) 01/07/2022 0213   HGB 12.2 (L) 01/07/2022 0213   HCT 34.2 (L) 01/07/2022 0213   PLT 223 01/07/2022 0213   MCV 86.8 01/07/2022 0213   MCH 31.0 01/07/2022 0213   MCHC 35.7 01/07/2022 0213   RDW 13.7 01/07/2022 0213   LYMPHSABS 2.9 01/06/2022  1052   MONOABS 0.6 01/06/2022 1052   EOSABS 0.6 (H) 01/06/2022 1052   BASOSABS 0.1 01/06/2022 1052      Assessment/Plan:  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  Even though the lithium level was mildly elevated, he had evidence of severe symptoms with AMS and myoclonus.  Temp HD cath placed by Dr. Katrinka Blazing with PCCM yesterday and had long session of HD with improvement of MS, myoclonus, and lithium level.  Lithium level reached a nadir of 0.39 but has increased to 0.55, then 0.69.  Will need to check lithium level again to make sure he will not rebound.  Hold further HD for now.  Hopefully he will be stable for discharge tomorrow.  Currently hemodynamically stable and able to go to [facility] with telemetry.  Continue to monitor closely. Acute encephalopathy - as above, improved. AKI - continue with IVF's and possible cause of lithium toxicity or result of.  Will continue to hold lisinopril. Schizophrenia, bipolar affective disorder -  admission to [facility] from January to March 2023 with acute psychosis. DM - hold metformin and insulin for now.  Continue to follow.   Irena Cords, MD [facility]

## 2022-01-07 NOTE — Evaluation (Signed)
Physical Therapy Evaluation Patient Details Name: Mitchell [name] MRN: [id number] DOB: 1961-02-15 Today's Date: 01/07/2022  History of Present Illness  61 year old male who presented 01/06/22 to the ED with altered mental status and myoclonic jerking. Patient was noted to have elevated lithium level consistent with lithium toxicity. 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 Per pt, he was independent with functional mobility PTA, but he is a poor historian. OT tried to confirm house set-up and PLOF with pt's group home, but the numbers did not work. Currently, pt demonstrates deficits in cognition, activity tolerance, balance, coordination, and gross strength, placing him at risk for falls and injuries. Pt is displaying jerking myoclonic movements that impact his balance and speed with mobility as well, requiring minA for transfers and to ambulate with a RW within the room at this time. Provided pt progresses well as anticipated and his facility can provide the level of care he needs, recommending HHPT at this time. Will continue to follow acutely.     Recommendations for follow up therapy are one component of a multi-disciplinary discharge planning process, led by the attending physician.  Recommendations may be updated based on patient status, additional functional criteria and insurance authorization.  Follow Up Recommendations Home health PT (pending progress)      Assistance Recommended at Discharge Frequent or constant Supervision/Assistance  Patient can return home with the following  A little help with walking and/or transfers;A little help with bathing/dressing/bathroom;Assistance with cooking/housework;Direct supervision/assist for medications  management;Direct supervision/assist for financial management;Assist for transportation;Help with stairs or ramp for entrance    Equipment Recommendations Rolling walker (2 wheels);BSC/3in1  Recommendations for Other Services       Functional Status Assessment Patient has had a recent decline in their functional status and demonstrates the ability to make significant improvements in function in a reasonable and predictable amount of time.     Precautions / Restrictions Precautions Precautions: Fall Restrictions Weight Bearing Restrictions: No      Mobility  Bed Mobility Overal bed mobility: Needs Assistance Bed Mobility: Sit to Supine       Sit to supine: Min guard   General bed mobility comments: Extra time and effort to lift legs onto bed with return to supine.    Transfers Overall transfer level: Needs assistance Equipment used: Rolling walker (2 wheels) Transfers: Sit to/from Stand, Bed to chair/wheelchair/BSC Sit to Stand: Min assist   Step pivot transfers: Min assist       General transfer comment: Light minA for stability with transfers to stand and step recliner > bed due to myoclonic jerking movements,    Ambulation/Gait Ambulation/Gait assistance: Min assist Gait Distance (Feet): 20 Feet Assistive device: Rolling walker (2 wheels) Gait Pattern/deviations: Step-through pattern, Decreased step length - right, Decreased step length - left, Decreased stride length Gait velocity: reduced Gait velocity interpretation: <1.31 ft/sec, indicative of household ambulator   General Gait Details: Pt with myoclonic jerking movements, taking small, slow steps along EOB for pt safety. MinA for stability and to direct RW.  Stairs            Wheelchair Mobility    Modified Rankin (Stroke Patients Only) Modified Rankin (Stroke Patients Only) Pre-Morbid Rankin Score: Moderate disability Modified Rankin: Moderately severe disability     Balance Overall balance  assessment: Needs  assistance Sitting-balance support: No upper extremity supported, Feet supported Sitting balance-Leahy Scale: Fair     Standing balance support: Bilateral upper extremity supported, During functional activity, Reliant on assistive device for balance Standing balance-Leahy Scale: Poor Standing balance comment: Reliant on RW and up to minA                             Pertinent Vitals/Pain Pain Assessment Pain Assessment: Faces Faces Pain Scale: No hurt Pain Intervention(s): Monitored during session    Home Living Family/patient expects to be discharged to:: Group home                        Prior Function Prior Level of Function : Independent/Modified Independent             Mobility Comments: reports he was IND without DME for mobility ADLs Comments: poor historian; reports being independent with self care and mobility; has assist for IADL tasks; enjoys working puzzles and crossword puzzles     Hand Dominance   Dominant Hand: Right    Extremity/Trunk Assessment   Upper Extremity Assessment Upper Extremity Assessment: Defer to OT evaluation LUE Deficits / Details: residual hemiparesis form prior CVA; uses as a funcitonal assist    Lower Extremity Assessment Lower Extremity Assessment: Generalized weakness (noted jerking myoclonic movements intermittentlys; grossly 4-5 MMT bil; reports slight tingling/numbness throughout bil)    Cervical / Trunk Assessment Cervical / Trunk Assessment: Normal  Communication   Communication: Other (comment) (delayed responses)  Cognition Arousal/Alertness: Awake/alert Behavior During Therapy: Flat affect Overall Cognitive Status: No family/caregiver present to determine baseline cognitive functioning                                 General Comments: following simple commands with slow processing; decreased awareness of safety, needing cues to sequence all tasks while remaining  safety with mobility; poor comprehension of questions in regard to PLOF        General Comments      Exercises     Assessment/Plan    PT Assessment Patient needs continued PT services  PT Problem List Decreased strength;Decreased activity tolerance;Decreased balance;Decreased mobility;Decreased coordination;Decreased cognition;Decreased knowledge of use of DME;Decreased safety awareness;Impaired sensation       PT Treatment Interventions DME instruction;Gait training;Stair training;Functional mobility training;Therapeutic activities;Therapeutic exercise;Balance training;Neuromuscular re-education;Cognitive remediation;Patient/family education    PT Goals (Current goals can be found in the Care Plan section)  Acute Rehab PT Goals Patient Stated Goal: to go back to bed PT Goal Formulation: With patient Time For Goal Achievement: 01/21/22 Potential to Achieve Goals: Good    Frequency Min 4X/week     Co-evaluation               AM-PAC PT "6 Clicks" Mobility  Outcome Measure Help needed turning from your back to your side while in a flat bed without using bedrails?: A Little Help needed moving from lying on your back to sitting on the side of a flat bed without using bedrails?: A Little Help needed moving to and from a bed to a chair (including a wheelchair)?: A Little Help needed standing up from a chair using your arms (e.g., wheelchair or bedside chair)?: A Little Help needed to walk in hospital room?: A Little Help needed climbing 3-5 steps with a railing? : A Lot 6 Click Score: 17  End of Session Equipment Utilized During Treatment: Gait belt Activity Tolerance: Patient tolerated treatment well Patient left: in bed;with call bell/phone within reach;with bed alarm set Nurse Communication: Mobility status PT Visit Diagnosis: Unsteadiness on feet (R26.81);Other abnormalities of gait and mobility (R26.89);Muscle weakness (generalized) (M62.81);Difficulty in walking,  not elsewhere classified (R26.2);Other symptoms and signs involving the nervous system (R29.898)    Time: [id number] PT Time Calculation (min) (ACUTE ONLY): 16 min   Charges:   PT Evaluation $PT Eval Moderate Complexity: 1 Mod          Moishe Spice, PT, DPT Acute Rehabilitation Services  Office: [phone]   Orvan Falconer 01/07/2022, 3:33 PM

## 2022-01-07 NOTE — Procedures (Signed)
Patient Name: KALONJI ZURAWSKI  MRN: [id number]  Epilepsy Attending: Lora Havens  Referring Physician/Provider: Lorenza Chick, MD  Date: 01/07/2022 Duration: 27.02 mins  Patient history: 61 year old male with altered mental status.  EEG to evaluate for seizure.  Level of alertness: Awake  AEDs during EEG study: None  Technical aspects: This EEG study was done with scalp electrodes positioned according to the 10-20 International system of electrode placement. Electrical activity was reviewed with band pass filter of 1-70Hz , sensitivity of 7 uV/mm, display speed of 53mm/sec with a 60Hz  notched filter applied as appropriate. EEG data were recorded continuously and digitally stored.  Video monitoring was available and reviewed as appropriate.  Description: The posterior dominant rhythm consists of 8 Hz activity of moderate voltage (25-35 uV) seen predominantly in posterior head regions, symmetric and reactive to eye opening and eye closing. EEG showed continuous generalized 3 to 6 Hz theta-delta slowing. Hyperventilation and photic stimulation were not performed.     ABNORMALITY - Continuous slow, generalized  IMPRESSION: This study is suggestive of mild to moderate diffuse encephalopathy, nonspecific etiology. No seizures or epileptiform discharges were seen throughout the recording.  Juanice Warburton Barbra Sarks

## 2022-01-07 NOTE — Progress Notes (Signed)
   01/06/22 2351  Vitals  Temp 98.7 F (37.1 C)  Temp Source Oral  BP 129/83  MAP (mmHg) 94  BP Location Right Arm  BP Method Automatic  Patient Position (if appropriate) Lying  Pulse Rate 95  Pulse Rate Source Monitor  ECG Heart Rate 96  Resp 20  Post Treatment  Dialyzer Clearance Heavily streaked  Duration of HD Treatment -hour(s) 6 hour(s)  Liters Processed 143.7  Fluid Removed 0 mL  Tolerated HD Treatment Yes   TX fin. Drew 10pm lith/level, seemed that shakiness had reduced.

## 2022-01-07 NOTE — Hospital Course (Addendum)
Mitchell [name] is a 61 y.o. male with past psychiatric history of schizophrenia on lithium, clozapine, and haldol, and past medical history of hypertension, T2 diabetes who presented to the ED from assisted living facility as code stroke due to concern for slurred speech and myoclonus and was found to have elevated lithium level.     Acute encephalopathy, suspected to be due to lithium toxicity Patient presented to ED as code stroke due to slurred speech, AMS, myoclonus and was found to have lithium level of 1.99. Other labs included Cr 1.6, Hgb 14, WBC 10.3. EKG was sinus rhythm with HR 85. CT head wnl. He was given IVF. Poison control was called and recommended fluid bolus, mIVF. Nephrology consulted and given his severe symptoms of AMS and myoclonus, he had HD for 6 hours. Pulm crit care consulted and placed HD catheter. After HD, his Li levels reached a nadir of 0.39 but increased to 0.55 then 0.69 and there ws concern for rebound. His lithium level was 0.54 at time of discharge. Neurology was also consulted. He had an EEG that did not show evidence of seizures. Given his continued tremors after HD, they obtained MRI which did not show evidence of acute intracranial process. Other labs for AMS w/u include TSH levels wnl. UDS negative. UA wnl. Clozapine level wnl. Hep B/C pending. Ammonia mildly elevated 36. HIV NR. Discussed with his outpatient psychiatric provider who continued his medications from his last hospitalization at [facility] but per the group home manager, his psychiatric meds had been changed at the facility and he was only taking lithium once a day and his clozapine was also changed from 25AM 75PM to 25AM and 100PM. The cause of his lithium toxicity was thought to be secondary to increase in lithium dosage when he transferred from one group home to another prior to hospital admission. His outpatient medication regimen was discussed with the psychiatric provider who recommended holding lithium for 7  days at discharge and decreasing his haldol to 5mg  AM, 5mg  PM due to concern for continued abnormal movement as either a continued side effect of lithium vs. his haldol.   Schizophrenia Admission to [facility] from 03/2021-05/2021 for acute psychosis. There was concern for cognitive impairment and patient with [facility] 13/30, failed pillbox test. Per home manager, patient at baseline does not communicate much and mostly responds yes or no. Patient's discharge meds included clozapine 75 QHS, donepezil 5mg , haldol 15mg  QHS, haldol 5mg  daily, and lithium 450 Q12H. Psychiatric provider reported restarting patient's medications at these doses. She recommended psychiatric med changes as above and will follow-up with him on 10/16.   AKI Patient presented with creatinine of 1.68. Baseline is 1.32. Suspect this is the result of lithium toxicity. Patient received IVF's and s/p HD. Cr 1.19 at time of discharge. Lisinopril held due to AKI on admission.   Hypertension  He was restarted home amlodipine and atenolol   T2DM Hgb A1C of 9.5 on admission. He was started on insulin levemir 20U BID and resistant SSI. His insulin was increased to 25U BID.

## 2022-01-07 NOTE — Progress Notes (Addendum)
Discussed with patient's psychiatric provider Zerita Boers. She reports that she did not make any medication changes when she saw him last month and had just continued him on his home meds. She confirmed home meds and dosages. She requests his discharge summary be faxed to her at time of discharge (fax# [phone]) and she set up follow-up appointment for him on 10/16. She recommended stopping lithium for 7 days after discharge and continuing the rest of his psych meds.

## 2022-01-07 NOTE — Progress Notes (Addendum)
Internal Medicine Attending:   I saw and examined the patient. I reviewed the resident's H and P note and I agree with the resident's findings and plan as documented in the resident's note.  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  Patient was noted to have slurred speech as well as fluctuating cognition and weakness mainly in his left upper extremity.  Patient was also noted to have myoclonic jerks on admission.  Patient was initially seen as a code stroke but it was thought that his symptoms are secondary to an underlying metabolic encephalopathy.  Patient was noted to have elevated lithium level of 1.99 consistent with lithium toxicity and had a hemodialysis catheter placed and is status post hemodialysis x1 session.  Today, patient states that he feels better still has some twitching in his face as well as upper and lower extremities.  Vitals:   01/07/22 0337 01/07/22 0740  BP: 115/64 129/81  Pulse: 84 80  Resp: 13 11  Temp: 98.8 F (37.1 C) 98.5 F (36.9 C)  SpO2: 96% 93%   On exam, patient is easily arousable and follows commands.  He is in no apparent distress.  Lungs are clear to auscultation bilaterally.  Cardiovascular exam reveals regular rate and rhythm with normal heart sounds.  Abdomen is soft, nondistended, nontender to palpation with normoactive bowel sounds.  Extremities reveal no pitting edema and no tenderness to palpation.  Patient has a flat affect.  On neuro exam he still has some twitching of his facial muscles as well as his upper and lower extremities.  Patient was admitted to the hospital with altered mental status and myoclonic jerks was noted to have an elevated lithium level consistent with impact history.  Patient is status post  hemodialysis x1 session with improvement in his symptoms.  Repeat lithium levels are all below 1.  No indication for repeat hemodialysis at this time.  Etiology behind his lithium persistently remains uncertain but is likely a combination of an AKI with increased lithium dose as an outpatient.  Patient's AKI has now resolved.  EEG showed no evidence for seizures.  We will reach out to his psych NP and discuss dosing with her prior to discharge.  No further work-up at this time.  Patient will likely be stable for DC home tomorrow.

## 2022-01-07 NOTE — Progress Notes (Signed)
EEG complete - results pending 

## 2022-01-07 NOTE — Evaluation (Signed)
Occupational Therapy Evaluation Patient Details Name: Mitchell [name] MRN: [id number] DOB: Sep 28, 1960 Today's Date: 01/07/2022   History of Present Illness 61 year old male who presented to the ED with altered mental status and myoclonic jerking. Patient was noted to have elevated lithium level consistent with lithium toxicity. 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 Staff assists with IADL tasks. Attempted to call both contacts in chart for confirmation however neither number was working.  Pt with jerking myoclonic movements however able to transfer to chair and complete ADL tasks  with min A  And increased time. Anticipate once movement improves pt will be able to DC back to his group home. Acute Ot to follow.    Recommendations for follow up therapy are one component of a multi-disciplinary discharge planning process, led by the attending physician.  Recommendations may be updated based on patient status, additional functional criteria and insurance authorization.   Follow Up Recommendations  Home health OT (pending progress)    Assistance Recommended at Discharge Frequent or constant Supervision/Assistance  Patient can return home with the following A little help with walking and/or transfers;A little help with bathing/dressing/bathroom;Assistance with cooking/housework;Direct supervision/assist for medications management;Direct supervision/assist for financial management;Assist for transportation    Functional Status Assessment  Patient has had a recent decline in their functional status and demonstrates the ability to make significant improvements in function in a reasonable and predictable amount of time.  Equipment  Recommendations  Tub/shower seat    Recommendations for Other Services PT consult     Precautions / Restrictions Precautions Precautions: Fall      Mobility Bed Mobility Overal bed mobility: Needs Assistance Bed Mobility: Supine to Sit     Supine to sit: Min guard          Transfers Overall transfer level: Needs assistance Equipment used: Rolling walker (2 wheels) Transfers: Sit to/from Stand, Bed to chair/wheelchair/BSC Sit to Stand: Min guard     Step pivot transfers: Min assist (due to jerking myocloinc movements)            Balance Overall balance assessment: Needs assistance   Sitting balance-Leahy Scale: Fair       Standing balance-Leahy Scale: Fair                             ADL either performed or assessed with clinical judgement   ADL Overall ADL's : Needs assistance/impaired Eating/Feeding: Supervision/ safety;Set up Eating/Feeding Details (indicate cue type and reason): dropping items due to yerky myoclonic movements; will do better with finger foods Grooming: Minimal assistance   Upper Body Bathing: Minimal assistance   Lower Body Bathing: Minimal assistance   Upper Body Dressing : Minimal assistance   Lower Body Dressing: Minimal assistance;Sit to/from stand   Toilet Transfer: Minimal assistance   Toileting- Clothing Manipulation and Hygiene: Minimal assistance       Functional mobility during ADLs: Minimal assistance;Rolling walker (2 wheels)       Vision   Additional Comments: reports no changes in baseline     Perception     Praxis      Pertinent Vitals/Pain Pain Assessment Pain Assessment: No/denies pain     Hand Dominance Right   Extremity/Trunk Assessment  Upper Extremity Assessment Upper Extremity Assessment: LUE deficits/detail LUE Deficits / Details: residual hemiparesis form prior CVA; uses as a funcitonal assist   Lower Extremity Assessment Lower Extremity Assessment: Defer to PT  evaluation   Cervical / Trunk Assessment Cervical / Trunk Assessment: Normal   Communication Communication Communication: Other (comment) (delayed resonses)   Cognition Arousal/Alertness: Awake/alert Behavior During Therapy: Flat affect, Impulsive Overall Cognitive Status: No family/caregiver present to determine baseline cognitive functioning                                 General Comments: oriented to self only; following commands; slow processing; decreased awareness of safety- chari alarm sounding - pt out of chair with leg rest up; will further assess     General Comments       Exercises     Shoulder Instructions      Home Living Family/patient expects to be discharged to:: Group home                                        Prior Functioning/Environment Prior Level of Function : Independent/Modified Independent               ADLs Comments: poor historian; reports being independent with self care and mobility; ha assist for IADL tasks; enjoys working puzzles adn crossword puzzles        OT Problem List: Impaired balance (sitting and/or standing);Decreased cognition;Decreased safety awareness;Obesity      OT Treatment/Interventions: Self-care/ADL training;Therapeutic exercise;DME and/or AE instruction;Therapeutic activities;Cognitive remediation/compensation;Patient/family education;Balance training    OT Goals(Current goals can be found in the care plan section) Acute Rehab OT Goals Patient Stated Goal: to do a puzzle OT Goal Formulation: Patient unable to participate in goal setting Time For Goal Achievement: 01/21/22 Potential to Achieve Goals: Good  OT Frequency: Min 2X/week    Co-evaluation              AM-PAC OT "6 Clicks" Daily Activity     Outcome Measure Help from another person eating meals?: A Little Help from another person taking care of personal grooming?: A Little Help from another person toileting, which  includes using toliet, bedpan, or urinal?: A Little Help from another person bathing (including washing, rinsing, drying)?: A Little Help from another person to put on and taking off regular upper body clothing?: A Little Help from another person to put on and taking off regular lower body clothing?: A Little 6 Click Score: 18   End of Session Equipment Utilized During Treatment: Gait belt;Rolling walker (2 wheels) Nurse Communication: Mobility status  Activity Tolerance: Patient tolerated treatment well Patient left: in chair;with call bell/phone within reach;with chair alarm set  OT Visit Diagnosis: Unsteadiness on feet (R26.81);Other symptoms and signs involving cognitive function                Time: 6734-1937 OT Time Calculation (min): 32 min Charges:  OT General Charges $OT Visit: 1 Visit OT Evaluation $OT Eval Moderate Complexity: 1 Mod OT Treatments $Self Care/Home Management : 8-22 mins  Maurie Boettcher, OT/L   Acute OT Clinical Specialist Acute Rehabilitation Services Pager [phone] Office [phone]   [facility] 01/07/2022, 3:02 PM

## 2022-01-08 DIAGNOSIS — E119 Type 2 diabetes mellitus without complications: Secondary | ICD-10-CM | POA: Diagnosis not present

## 2022-01-08 DIAGNOSIS — F209 Schizophrenia, unspecified: Secondary | ICD-10-CM

## 2022-01-08 DIAGNOSIS — T56891A Toxic effect of other metals, accidental (unintentional), initial encounter: Secondary | ICD-10-CM | POA: Diagnosis not present

## 2022-01-08 DIAGNOSIS — Z794 Long term (current) use of insulin: Secondary | ICD-10-CM | POA: Diagnosis not present

## 2022-01-08 LAB — CBC
HCT: 37.7 % — ABNORMAL LOW (ref 39.0–52.0)
Hemoglobin: 12.9 g/dL — ABNORMAL LOW (ref 13.0–17.0)
MCH: 30.4 pg (ref 26.0–34.0)
MCHC: 34.2 g/dL (ref 30.0–36.0)
MCV: 88.9 fL (ref 80.0–100.0)
Platelets: 229 10*3/uL (ref 150–400)
RBC: 4.24 MIL/uL (ref 4.22–5.81)
RDW: 13.9 % (ref 11.5–15.5)
WBC: 7.7 10*3/uL (ref 4.0–10.5)
nRBC: 0 % (ref 0.0–0.2)

## 2022-01-08 LAB — RENAL FUNCTION PANEL
Albumin: 3.6 g/dL (ref 3.5–5.0)
Anion gap: 8 (ref 5–15)
BUN: 7 mg/dL (ref 6–20)
CO2: 25 mmol/L (ref 22–32)
Calcium: 9.3 mg/dL (ref 8.9–10.3)
Chloride: 105 mmol/L (ref 98–111)
Creatinine, Ser: 1.19 mg/dL (ref 0.61–1.24)
GFR, Estimated: >60 mL/min (ref >60)
Glucose, Bld: 231 mg/dL — ABNORMAL HIGH (ref 70–99)
Phosphorus: 3.3 mg/dL (ref 2.5–4.6)
Potassium: 4.3 mmol/L (ref 3.5–5.1)
Sodium: 138 mmol/L (ref 135–145)

## 2022-01-08 LAB — GLUCOSE, CAPILLARY
Glucose-Capillary: 249 mg/dL — ABNORMAL HIGH (ref 70–99)
Glucose-Capillary: 316 mg/dL — ABNORMAL HIGH (ref 70–99)
Glucose-Capillary: 160 mg/dL — ABNORMAL HIGH (ref 70–99)

## 2022-01-08 LAB — LITHIUM LEVEL: Lithium Lvl: 0.54 mmol/L — ABNORMAL LOW (ref 0.60–1.20)

## 2022-01-08 MED ORDER — HALOPERIDOL 5 MG PO TABS: 5.0000 mg | ORAL_TABLET | Freq: Every day | ORAL | 0 refills | Status: DC

## 2022-01-08 MED ORDER — CLOZAPINE 50 MG PO TABS: 25.0000 mg | ORAL_TABLET | Freq: Every morning | ORAL | 0 refills | Status: DC

## 2022-01-08 MED ORDER — LITHIUM CARBONATE ER 450 MG PO TBCR: 450.0000 mg | EXTENDED_RELEASE_TABLET | Freq: Two times a day (BID) | ORAL | 0 refills | Status: DC

## 2022-01-08 MED ORDER — INSULIN DETEMIR 100 UNIT/ML ~~LOC~~ SOLN
25.0000 [IU] | Freq: Two times a day (BID) | SUBCUTANEOUS | Status: DC
  Administered 2022-01-08: 25 [IU] via SUBCUTANEOUS
  Filled 2022-01-08 (×2): qty 0.25

## 2022-01-08 NOTE — Plan of Care (Signed)
MRI brain personally reviewed, agree with radiology that there is no acute intracranial process.  No further inpatient neurological work-up indicated at this time  Please reach out to neurology if any other questions or concerns arise, we will be available as needed  Lesleigh Noe MD-PhD [facility] [phone] Available 7 AM to 7 PM, outside these hours please contact Neurologist on call listed on AMION   No charge plan of care note

## 2022-01-08 NOTE — Progress Notes (Signed)
This nurse took out this patients IJ catheter. Catheter tip is intact. Vital signs are stable  HR 83 P 84 BP 154/94 (110) T 98.9 R 18 SpO2 100  Shelbie Proctor, RN

## 2022-01-08 NOTE — Discharge Instructions (Addendum)
Mr. Deeley, it has been a pleasure caring for you and I am so happy to see you are doing well!   You were hospitalized for lithium toxicity and treated for it with dialysis. Since then you have recovered, you are no longer requiring dialysis or hospitalization.  We also continued a lot of your home medications to manage your chronic conditions. In addition to medications, you were also seen by neurology, nephrology, and pulmonary and critical care doctors.   When you are discharged we would like you to do the following:  1. You were started on the following medications in the hospital. Please continue them using these following instructions:  2.  Some of your medication regimen has been changed.  Please see the following instructions for the changes: -please discontinue your lisinopril for now  -please stop taking your lithium for the next 7 days until you see your outpatient psychiatric provider -please decrease your haldol to 5mg  AM, 5mg  PM  3. Continue taking the rest of the medications as you were before you came to the hospital.  4. Follow-up with your PCP and psychiatrist     Take care!  Internal Medicine Teaching Service

## 2022-01-08 NOTE — Progress Notes (Signed)
Patient ID: Mitchell [name], male   DOB: February 09, 1961, 62 y.o.   MRN: [id number] S: No new complaints. O:BP (!) 146/98 (BP Location: Right Arm)   Pulse 87   Temp 98.9 F (37.2 C) (Oral)   Resp 20   Ht 5\' 4"  (1.626 m)   Wt 90.7 kg   SpO2 94%   BMI 34.32 kg/m   Intake/Output Summary (Last 24 hours) at 01/08/2022 1114 Last data filed at 01/08/2022 0915 Gross per 24 hour  Intake 480 ml  Output 1650 ml  Net -1170 ml   Intake/Output: I/O last 3 completed shifts: In: 240 [P.O.:240] Out: 2300 [Urine:2300]  Intake/Output this shift:  Total I/O In: 240 [P.O.:240] Out: -  Weight change:  Gen: NAD CVS: RRR Resp:CTA Abd: distended, +BS, soft, NT Ext: no edema  Recent Labs  Lab 01/06/22 1052 01/06/22 1102 01/07/22 0213 01/08/22 0550  NA 137 138 133* 138  K 4.6 4.5 3.3* 4.3  CL 105 104 98 105  CO2 26  --  27 25  GLUCOSE 179* 175* 194* 231*  BUN 13 16 <5* 7  CREATININE 1.68* 1.60* 0.86 1.19  ALBUMIN 4.1  --  3.5 3.6  CALCIUM 9.9  --  8.2* 9.3  PHOS  --   --  2.0* 3.3  AST 27  --  21  --   ALT 31  --  26  --    Liver Function Tests: Recent Labs  Lab 01/06/22 1052 01/07/22 0213 01/08/22 0550  AST 27 21  --   ALT 31 26  --   ALKPHOS 170* 158*  --   BILITOT 0.6 0.3  --   PROT 8.3* 6.9  --   ALBUMIN 4.1 3.5 3.6   No results for input(s): "LIPASE", "AMYLASE" in the last 168 hours. Recent Labs  Lab 01/06/22 1612  AMMONIA 36*   CBC: Recent Labs  Lab 01/06/22 1052 01/06/22 1102 01/07/22 0213 01/08/22 0550  WBC 10.3  --  8.7 7.7  NEUTROABS 6.2  --   --   --   HGB 14.0 15.0 12.2* 12.9*  HCT 42.2 44.0 34.2* 37.7*  MCV 91.7  --  86.8 88.9  PLT 280  --  223 229   Cardiac Enzymes: No results for input(s): "CKTOTAL", "CKMB", "CKMBINDEX", "TROPONINI" in the last 168 hours. CBG: Recent Labs  Lab 01/07/22 0744 01/07/22 1141 01/07/22 1642 01/07/22 2059 01/08/22 0756  GLUCAP 231* 207* 257* 154* 249*    Iron Studies: No results for input(s): "IRON", "TIBC",  "TRANSFERRIN", "FERRITIN" in the last 72 hours. Studies/Results: MR BRAIN WO CONTRAST  Result Date: 01/07/2022 CLINICAL DATA:  Altered mental status EXAM: MRI HEAD WITHOUT CONTRAST TECHNIQUE: Multiplanar, multiecho pulse sequences of the brain and surrounding structures were obtained without intravenous contrast. COMPARISON:  None Available. FINDINGS: Brain: No acute infarct, mass effect or extra-axial collection. No acute or chronic hemorrhage. There is multifocal hyperintense T2-weighted signal within the white matter. Parenchymal volume and CSF spaces are normal. The midline structures are normal. Vascular: Major flow voids are preserved. Skull and upper cervical spine: Normal calvarium and skull base. Visualized upper cervical spine and soft tissues are normal. Sinuses/Orbits:Right mastoid effusion.  Normal orbits. IMPRESSION: 1. No acute intracranial abnormality. 2. Chronic ischemic white matter changes. Electronically Signed   By: 03/09/2022 M.D.   On: 01/07/2022 22:23   EEG adult  Result Date: 01/07/2022 03/09/2022, MD     01/07/2022  8:50 AM Patient Name: Mitchell [name] MRN:  [id number] Epilepsy Attending: Lora Havens Referring Physician/Provider: Lorenza Chick, MD Date: 01/07/2022 Duration: 27.02 mins Patient history: 61 year old male with altered mental status.  EEG to evaluate for seizure. Level of alertness: Awake AEDs during EEG study: None Technical aspects: This EEG study was done with scalp electrodes positioned according to the 10-20 International system of electrode placement. Electrical activity was reviewed with band pass filter of 1-70Hz , sensitivity of 7 uV/mm, display speed of 3mm/sec with a 60Hz  notched filter applied as appropriate. EEG data were recorded continuously and digitally stored.  Video monitoring was available and reviewed as appropriate. Description: The posterior dominant rhythm consists of 8 Hz activity of moderate voltage (25-35 uV) seen  predominantly in posterior head regions, symmetric and reactive to eye opening and eye closing. EEG showed continuous generalized 3 to 6 Hz theta-delta slowing. Hyperventilation and photic stimulation were not performed.   ABNORMALITY - Continuous slow, generalized IMPRESSION: This study is suggestive of mild to moderate diffuse encephalopathy, nonspecific etiology. No seizures or epileptiform discharges were seen throughout the recording. Lora Havens   DG Chest Port 1 View  Result Date: 01/06/2022 CLINICAL DATA:  Central line placement EXAM: PORTABLE CHEST 1 VIEW COMPARISON:  Chest radiograph 05/26/2021 FINDINGS: Interval placement of a venous catheter in the SVC. No pleural effusion. No pneumothorax. No focal airspace opacity. Unchanged cardiac and mediastinal contours. Visualized upper abdomen is unremarkable. No displaced rib fractures. IMPRESSION: Interval placement of a right-sided central venous catheter. No pneumothorax. Electronically Signed   By: Marin Roberts M.D.   On: 01/06/2022 15:45    amLODipine  10 mg Oral Daily   aspirin  81 mg Oral Daily   atenolol  12.5 mg Oral Daily   Chlorhexidine Gluconate Cloth  6 each Topical Q0600   enoxaparin (LOVENOX) injection  40 mg Subcutaneous Q24H   insulin aspart  0-20 Units Subcutaneous TID WC   insulin detemir  25 Units Subcutaneous BID    BMET    Component Value Date/Time   NA 138 01/08/2022 0550   K 4.3 01/08/2022 0550   CL 105 01/08/2022 0550   CO2 25 01/08/2022 0550   GLUCOSE 231 (H) 01/08/2022 0550   BUN 7 01/08/2022 0550   CREATININE 1.19 01/08/2022 0550   CREATININE 1.32 05/20/2020 0000   CALCIUM 9.3 01/08/2022 0550   GFRNONAA >60 01/08/2022 0550   GFRNONAA 59 (L) 05/20/2020 0000   GFRAA 68 05/20/2020 0000   CBC    Component Value Date/Time   WBC 7.7 01/08/2022 0550   RBC 4.24 01/08/2022 0550   HGB 12.9 (L) 01/08/2022 0550   HCT 37.7 (L) 01/08/2022 0550   PLT 229 01/08/2022 0550   MCV 88.9 01/08/2022 0550   MCH  30.4 01/08/2022 0550   MCHC 34.2 01/08/2022 0550   RDW 13.9 01/08/2022 0550   LYMPHSABS 2.9 01/06/2022 1052   MONOABS 0.6 01/06/2022 1052   EOSABS 0.6 (H) 01/06/2022 1052   BASOSABS 0.1 01/06/2022 1052      Assessment/Plan:  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  Even though the lithium level was mildly elevated, he had evidence of severe symptoms with AMS and myoclonus.  Temp HD cath placed by Dr. Tamala Julian with PCCM yesterday and had long session of HD with improvement of MS, myoclonus, and lithium level.  Lithium level reached a nadir of 0.39 but has increased to 0.55, then 0.69 but now 0.54.  Will d/c HD catheter.  Nothing further to  add and will sign off.  Please call with questions or concerns.  He will need to have his lithium levels followed closely after discharge. Acute encephalopathy - as above, improved. AKI - continue with IVF's and possible cause of lithium toxicity or result of.  Ok to resume lisinopril and follow renal function while he remains and inpatient. Schizophrenia, bipolar affective disorder - admission to [facility] from January to March 2023 with acute psychosis. DM - hold metformin and insulin for now.  Continue to follow.   Irena Cords, MD [facility]

## 2022-01-08 NOTE — Care Management Important Message (Signed)
Important Message  Patient Details  Name: Mitchell [name] MRN: [id number] Date of Birth: 1960/11/13   Medicare Important Message Given:  Yes     Francely Craw 01/08/2022, 2:59 PM

## 2022-01-08 NOTE — Progress Notes (Signed)
Physical Therapy Treatment Patient Details Name: Mitchell [name] MRN: [id number] DOB: [birth year] Today's Date: 01/08/2022   History of Present Illness 61 year old male who presented 01/06/22 to the ED with altered mental status and myoclonic jerking. Patient was noted to have elevated lithium level consistent with lithium toxicity. 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 He required minG-minA for OOB mobility and to maintain stability, but all VSS with activity. The pt was able to recall information such as room number and information given by other staff during session, but requires significantly increased time to respond (as much as 10-15 seconds) and benefits from mod-max cues for safety, technique, gait pattern, and use of DME. Continue to recommend skilled PT acutely and after d/c to maximize functional recovery and safe return to independence.     Recommendations for follow up therapy are one component of a multi-disciplinary discharge planning process, led by the attending physician.  Recommendations may be updated based on patient status, additional functional criteria and insurance authorization.  Follow Up Recommendations  Home health PT     Assistance Recommended at Discharge Frequent or constant Supervision/Assistance  Patient can return home with the following A little help with walking and/or transfers;A little help with bathing/dressing/bathroom;Assistance with cooking/housework;Direct supervision/assist for medications management;Direct supervision/assist for financial management;Assist for transportation;Help with stairs or ramp for entrance   Equipment Recommendations  Rolling walker (2 wheels);BSC/3in1     Recommendations for Other Services       Precautions / Restrictions Precautions Precautions: Fall Restrictions Weight Bearing Restrictions: No     Mobility  Bed Mobility Overal bed mobility: Needs Assistance Bed Mobility: Supine to Sit     Supine to sit: Min assist     General bed mobility comments: minA to manage LE, pt able to elevate trunk with increased time and effort    Transfers Overall transfer level: Needs assistance Equipment used: Rolling walker (2 wheels) Transfers: Sit to/from Stand Sit to Stand: Min assist           General transfer comment: minA to rise to standing, no overt LOB with initial stand    Ambulation/Gait Ambulation/Gait assistance: Min assist Gait Distance (Feet): 150 Feet Assistive device: Rolling walker (2 wheels), None Gait Pattern/deviations: Step-through pattern, Decreased step length - right, Decreased step length - left, Decreased stride length, Shuffle Gait velocity: reduced Gait velocity interpretation: <1.31 ft/sec, indicative of household ambulator   General Gait Details: pt at times with small, shuffling steps with minimal clearance, responds well to cues for increased stride length but is only able to maintain for 15-20 ft. minG-minA to steady with max cues for directioning/wayfinding. short bout of ambulation in room without use of DME without overt LOB     Balance Overall balance assessment: Needs assistance Sitting-balance support: No upper extremity supported, Feet supported Sitting balance-Leahy Scale: Fair     Standing balance support: Bilateral upper extremity supported, During functional activity, Reliant on assistive device for balance Standing balance-Leahy Scale: Fair Standing balance comment: able to complete short bout of ambulation without UE support, improved stability with BUE support for gait  Cognition Arousal/Alertness: Awake/alert Behavior During Therapy: Flat  affect Overall Cognitive Status: No family/caregiver present to determine baseline cognitive functioning Area of Impairment: Orientation, Attention, Awareness, Safety/judgement, Following commands, Problem solving                 Orientation Level: Time, Situation Current Attention Level: Focused   Following Commands: Follows one step commands inconsistently, Follows one step commands with increased time Safety/Judgement: Decreased awareness of deficits, Decreased awareness of safety Awareness: Intellectual Problem Solving: Slow processing, Decreased initiation, Difficulty sequencing, Requires verbal cues General Comments: pt needing significantly increased time, sometimes up to 10-15 seconds prior to responding to question or command. Is able to recall room number and information given in session with increased time and prompting        Exercises      General Comments General comments (skin integrity, edema, etc.): VSS on RA      Pertinent Vitals/Pain Pain Assessment Pain Assessment: Faces Faces Pain Scale: No hurt Pain Intervention(s): Monitored during session     PT Goals (current goals can now be found in the care plan section) Acute Rehab PT Goals Patient Stated Goal: to go home soon PT Goal Formulation: With patient Time For Goal Achievement: 01/21/22 Potential to Achieve Goals: Good Progress towards PT goals: Progressing toward goals    Frequency    Min 4X/week      PT Plan Current plan remains appropriate       AM-PAC PT "6 Clicks" Mobility   Outcome Measure  Help needed turning from your back to your side while in a flat bed without using bedrails?: A Little Help needed moving from lying on your back to sitting on the side of a flat bed without using bedrails?: A Little Help needed moving to and from a bed to a chair (including a wheelchair)?: A Little Help needed standing up from a chair using your arms (e.g., wheelchair or bedside chair)?: A  Little Help needed to walk in hospital room?: A Little Help needed climbing 3-5 steps with a railing? : A Lot 6 Click Score: 17    End of Session Equipment Utilized During Treatment: Gait belt Activity Tolerance: Patient tolerated treatment well Patient left: with call bell/phone within reach;in chair;with chair alarm set (posey alarm belt) Nurse Communication: Mobility status PT Visit Diagnosis: Unsteadiness on feet (R26.81);Other abnormalities of gait and mobility (R26.89);Muscle weakness (generalized) (M62.81);Difficulty in walking, not elsewhere classified (R26.2);Other symptoms and signs involving the nervous system (R29.898)     Time: 3149-7026 PT Time Calculation (min) (ACUTE ONLY): 25 min  Charges:  $Gait Training: 8-22 mins $Therapeutic Exercise: 8-22 mins                     Vickki Muff, PT, DPT   Acute Rehabilitation Department   Ronnie Derby 01/08/2022, 12:22 PM

## 2022-01-08 NOTE — Progress Notes (Signed)
error 

## 2022-01-08 NOTE — TOC Transition Note (Signed)
Transition of Care [facility]) - CM/SW Discharge Note   Patient Details  Name: Mitchell [name] MRN: [id number] Date of Birth: 06/02/1960  [facility] CM/SW Contact:  Ella Bodo, RN Phone Number: 01/08/2022, 4:12 PM   Clinical Narrative:    61 year old male who presented 01/06/22 to the ED with altered mental status and myoclonic jerking. Patient was noted to have elevated lithium level consistent with lithium toxicity.  PTA, pt independent with self care and mobility; he lives at [place].  Spoke with Anitha from group home, and discussed recommendation of home health PT/OT; she states she has no preference for home health agency.  Referral to [facility] home health for continued therapies at group home.   Final next level of care: Group Home Barriers to Discharge: Barriers Resolved                  Patient to be transferred to facility by: private car Name of family member notified: group home Patient and family notified of of transfer: 01/08/22  Discharge Plan and Services   Discharge Planning Services: CM Consult                      HH Arranged: PT, OT [facility] Agency: [place] Date [place]: 01/08/22 Time [place]: [id number] Representative spoke with at [place]: Adela Lank  Social Determinants of Health ([place]) Interventions     Readmission Risk Interventions     No data to display         Reinaldo Raddle, RN, BSN  Trauma/Neuro ICU Case Manager [phone]

## 2022-01-08 NOTE — TOC Transition Note (Signed)
Transition of Care [facility]) - CM/SW Discharge Note   Patient Details  Name: RAIFORD FETTERMAN MRN: [id number] Date of Birth: [birth year]  Transition of Care [facility]) CM/SW Contact:  Vinie Sill, LCSW Phone Number: 01/08/2022, 2:47 PM   Clinical Narrative:     Patient will Discharge to: [place] Discharge Date: 01/08/2022 Family Notified: no answer- informed group home Transport By: private car  Per MD patient is ready for discharge. RN, patient, and facility notified of discharge. Discharge Summary sent to facility. RN given number for report [phone].   Clinical Social Worker signing off.  Thurmond Butts, MSW, LCSW Clinical Social Worker    Final next level of care: Group Home Barriers to Discharge: Barriers Resolved   Patient Goals and CMS Choice        Discharge Placement                Patient to be transferred to facility by: private car Name of family member notified: group home Patient and family notified of of transfer: 01/08/22  Discharge Plan and Services                                     Social Determinants of Health (SDOH) Interventions     Readmission Risk Interventions     No data to display

## 2022-01-08 NOTE — Discharge Summary (Addendum)
Name: Mitchell [name] MRN: [id number] DOB: [birth year] 61 y.o. PCP: Pcp, No  Date of Admission: 01/06/2022 10:39 AM Date of Discharge: 01/08/22 Attending Physician: Dr.  Ninetta Lights  Discharge Diagnosis: Principal Problem:   Lithium toxicity, accidental or unintentional, initial encounter Active Problems:   Lithium toxicity   Hypertension   Type 2 diabetes mellitus (HCC)   Schizophrenia (HCC)   Acute kidney injury [facility])    Discharge Medications: Allergies as of 01/08/2022   No Known Allergies      Medication List     STOP taking these medications    lisinopril 20 MG tablet Commonly known as: ZESTRIL       TAKE these medications    amLODipine 10 MG tablet Commonly known as: NORVASC Take 1 tablet (10 mg total) by mouth at bedtime.   aspirin EC 81 MG tablet Take 1 tablet (81 mg total) by mouth daily. Swallow whole.   atenolol 25 MG tablet Commonly known as: TENORMIN Take 0.5 tablets (12.5 mg total) by mouth daily.   cloNIDine 0.2 MG tablet Commonly known as: CATAPRES Take 0.2 mg by mouth 2 (two) times daily.   cloZAPine 25 MG tablet Commonly known as: CLOZARIL Take 3 tablets (75 mg total) by mouth at bedtime. What changed:  how much to take when to take this additional instructions   clozapine 50 MG tablet Commonly known as: CLOZARIL Take 0.5 tablets (25 mg total) by mouth in the morning. What changed: You were already taking a medication with the same name, and this prescription was added. Make sure you understand how and when to take each.   donepezil 5 MG tablet Commonly known as: ARICEPT Take 1 tablet (5 mg total) by mouth at bedtime.   haloperidol 5 MG tablet Commonly known as: HALDOL Take 1 tablet (5 mg total) by mouth daily. What changed: Another medication with the same name was changed. Make sure you understand how and when to take each.   haloperidol 5 MG tablet Commonly known as: HALDOL Take 1 tablet (5 mg total) by mouth at  bedtime. What changed:  medication strength how much to take additional instructions   insulin aspart 100 UNIT/ML injection Commonly known as: novoLOG Inject 0-9 Units into the skin 3 (three) times daily with meals. What changed: how much to take   insulin glargine 100 UNIT/ML injection Commonly known as: LANTUS Inject 15 Units into the skin 2 (two) times daily.   lithium carbonate 450 MG CR tablet Commonly known as: ESKALITH Take 1 tablet (450 mg total) by mouth every 12 (twelve) hours. Start taking on: January 15, 2022 What changed: These instructions start on January 15, 2022. If you are unsure what to do until then, ask your doctor or other care provider.   metFORMIN 1000 MG tablet Commonly known as: GLUCOPHAGE Take 1,000 mg by mouth 2 (two) times daily.   senna 8.6 MG Tabs tablet Commonly known as: SENOKOT Take 2 tablets (17.2 mg total) by mouth daily.        Disposition and follow-up:   Mitchell [name] was discharged from [facility] in Stable condition.  At the hospital follow up visit please address:  1.  Follow-up:  a.  Whether or not to restart his lithium and monitor his levels  b.  His glucose control   c.  His abnormal movements             d. Blood pressure control and when to restart his lisinopril  2.  Labs / imaging needed at time of follow-up: None  3.  Pending labs/ test needing follow-up: HepC antibody, HepB sAg, HepB core antibody  Follow-up Appointments: -Please follow up with psychiatry and your PCP   Hospital Course by problem list: Mitchell [name] is a 61 y.o. male with past psychiatric history of schizophrenia on lithium, clozapine, and haldol, and past medical history of hypertension, T2 diabetes who presented to the ED from assisted living facility as code stroke due to concern for slurred speech and myoclonus and was found to have elevated lithium level.     Acute encephalopathy, suspected to be due to lithium  toxicity Patient presented to ED as code stroke due to slurred speech, AMS, myoclonus and was found to have lithium level of 1.99. Other labs included Cr 1.6, Hgb 14, WBC 10.3. EKG was sinus rhythm with HR 85. CT head wnl. He was given IVF. Poison control was called and recommended fluid bolus, mIVF. Nephrology consulted and given his severe symptoms of AMS and myoclonus, he had HD for 6 hours. Pulm crit care consulted and placed HD catheter. After HD, his Li levels reached a nadir of 0.39 but increased to 0.55 then 0.69 and there ws concern for rebound. His lithium level was 0.54 at time of discharge. Neurology was also consulted. He had an EEG that did not show evidence of seizures. Given his continued tremors after HD, they obtained MRI which did not show evidence of acute intracranial process. Other labs for AMS w/u include TSH levels wnl. UDS negative. UA wnl. Clozapine level wnl. Hep B/C pending. Ammonia mildly elevated 36. HIV NR. Discussed with his outpatient psychiatric provider who continued his medications from his last hospitalization at [facility] but per the group home manager, his psychiatric meds had been changed at the facility and he was only taking lithium once a day and his clozapine was also changed from 25AM 75PM to 25AM and 100PM. The cause of his lithium toxicity was thought to be secondary to increase in lithium dosage when he transferred from one group home to another prior to hospital admission. His outpatient medication regimen was discussed with the psychiatric provider who recommended holding lithium for 7 days at discharge and decreasing his haldol to 5mg  AM, 5mg  PM due to concern for continued abnormal movement as either a continued side effect of lithium vs. his haldol.   Schizophrenia Admission to [facility] from 03/2021-05/2021 for acute psychosis. There was concern for cognitive impairment and patient with [facility] 13/30, failed pillbox test. Per home manager, patient at baseline does not  communicate much and mostly responds yes or no. Patient's discharge meds included clozapine 75 QHS, donepezil 5mg , haldol 15mg  QHS, haldol 5mg  daily, and lithium 450 Q12H. Psychiatric provider reported restarting patient's medications at these doses. She recommended psychiatric med changes as above and will follow-up with him on 10/16.   AKI Patient presented with creatinine of 1.68. Baseline is 1.32. Suspect this is the result of lithium toxicity. Patient received IVF's and s/p HD. Cr 1.19 at time of discharge. Lisinopril held due to AKI on admission.   Hypertension  He was restarted home amlodipine and atenolol   T2DM Hgb A1C of 9.5 on admission. He was started on insulin levemir 20U BID and resistant SSI. His insulin was increased to 25U BID.    Subjective: Patient reports feeling fine this morning. Sleepy but arousable to verbal stimuli and follows instructions. Denies auditory or visual hallucinations. Oriented to person and place only.  Communicated plan of continuing psychiatric medications.   Discharge Vitals:   BP (!) 144/74 (BP Location: Right Arm)   Pulse 93   Temp 98.6 F (37 C) (Oral)   Resp 15   Ht 5\' 4"  (1.626 m)   Wt 90.7 kg   SpO2 100%   BMI 34.32 kg/m   Discharge exam: General: African American male lying in bed in no acute distress  CV: RRR. No m/r/g. Pulm: Normal WOB on RA. CTAB.  Abdomen: Obese, soft, non-tender, non-distended Neuro: Oriented to self and place. Not oriented to situation or time. Pinpoint pupils bilaterally. EOM intact. Tongue midline. 5/5 strength in BUE and BLE. Occasional myoclonus noted in BUE and BLE, appears worse on L vs R.   Pertinent Labs, Studies, and Procedures:     Latest Ref Rng & Units 01/08/2022    5:50 AM 01/07/2022    2:13 AM 01/06/2022   11:02 AM  CBC  WBC 4.0 - 10.5 K/uL 7.7  8.7    Hemoglobin 13.0 - 17.0 g/dL 03/08/2022  16.1  09.6   Hematocrit 39.0 - 52.0 % 37.7  34.2  44.0   Platelets 150 - 400 K/uL 229  223          Latest Ref Rng & Units 01/08/2022    5:50 AM 01/07/2022    2:13 AM 01/06/2022   11:02 AM  CMP  Glucose 70 - 99 mg/dL 03/08/2022  409  811   BUN 6 - 20 mg/dL 7  <5  16   Creatinine 0.61 - 1.24 mg/dL 914  7.82  9.56   Sodium 135 - 145 mmol/L 138  133  138   Potassium 3.5 - 5.1 mmol/L 4.3  3.3  4.5   Chloride 98 - 111 mmol/L 105  98  104   CO2 22 - 32 mmol/L 25  27    Calcium 8.9 - 10.3 mg/dL 9.3  8.2    Total Protein 6.5 - 8.1 g/dL  6.9    Total Bilirubin 0.3 - 1.2 mg/dL  0.3    Alkaline Phos 38 - 126 U/L  158    AST 15 - 41 U/L  21    ALT 0 - 44 U/L  26     Clozapine 486 TSH 2.402 T4 0.66  MR BRAIN WO CONTRAST Result Date: 01/07/2022 CLINICAL DATA:  Altered mental status EXAM: MRI HEAD WITHOUT CONTRAST TECHNIQUE: IMPRESSION: 1. No acute intracranial abnormality. 2. Chronic ischemic white matter changes. Electronically Signed   By: 03/09/2022 M.D.   On: 01/07/2022 22:23   EEG adult Result Date: 01/07/2022 IMPRESSION: This study is suggestive of mild to moderate diffuse encephalopathy, nonspecific etiology. No seizures or epileptiform discharges were seen throughout the recording. 03/09/2022   DG Chest Port 1 View Result Date: 01/06/2022 CLINICAL DATA:  Central line placement EXAM: PORTABLE CHEST 1 VIEW COMPARISON:  Chest radiograph 05/26/2021 IMPRESSION: Interval placement of a right-sided central venous catheter. No pneumothorax. Electronically Signed   By: 05/28/2021 M.D.   On: 01/06/2022 15:45   CT HEAD CODE STROKE WO CONTRAST Result Date: 01/06/2022 IMPRESSION: 1. No acute intracranial pathology. 2. ASPECTS is 10 These results were paged via AMION at the time of interpretation on 01/06/2022 at 10:48 am to provider Bhagat. Electronically Signed   By: 03/08/2022 M.D.   On: 01/06/2022 10:49        Discharge Instructions      [name], it has been a pleasure caring for  you and I am so happy to see you are doing well!   You were hospitalized for lithium toxicity  and treated for it with dialysis. Since then you have recovered, you are no longer requiring dialysis or hospitalization.  We also continued a lot of your home medications to manage your chronic conditions. In addition to medications, you were also seen by neurology, nephrology, and pulmonary and critical care doctors.   When you are discharged we would like you to do the following:  1. You were started on the following medications in the hospital. Please continue them using these following instructions:  2.  Some of your medication regimen has been changed.  Please see the following instructions for the changes: -please discontinue your lisinopril for now  -please stop taking your lithium for the next 7 days until you see your outpatient psychiatric provider -please decrease your haldol to 5mg  AM, 5mg  PM  3. Continue taking the rest of the medications as you were before you came to the hospital.  4. Follow-up with your PCP and psychiatrist     Take care!  Internal Medicine Teaching Service      Signed: , MD 01/08/2022, 2:24 PM   Pager: [phone]

## 2022-01-08 NOTE — Progress Notes (Signed)
Occupational Therapy Treatment Patient Details Name: Mitchell [name] MRN: [id number] DOB: 12-17-1960 Today's Date: 01/08/2022   History of present illness 61 year old male who presented 01/06/22 to the ED with altered mental status and myoclonic jerking. Patient was noted to have elevated lithium level consistent with lithium toxicity. 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  Flat affect throughout session with inconsistency with following one step commands and answering therapist's questions.  Decreased awareness of time and situation as well as decreased memory.  Will continue to benefit from acute care OT to address current ADL deficits.  Will need 24 hr supervision/assist at discharge based on current level.  Will continue to follow   Recommendations for follow up therapy are one component of a multi-disciplinary discharge planning process, led by the attending physician.  Recommendations may be updated based on patient status, additional functional criteria and insurance authorization.    Follow Up Recommendations  Home health OT (of group home can provide some assist depending on progress)    Assistance Recommended at Discharge Frequent or constant Supervision/Assistance  Patient can return home with the following  A little help with walking and/or transfers;A little help with bathing/dressing/bathroom;Assistance with cooking/housework;Direct supervision/assist for medications management;Direct supervision/assist for financial management;Assist for transportation   Equipment Recommendations  Tub/shower seat       Precautions / Restrictions Precautions Precautions: Fall Restrictions Weight Bearing Restrictions: No       Mobility Bed Mobility Overal bed mobility: Needs  Assistance Bed Mobility: Supine to Sit     Supine to sit: Min assist, HOB elevated     General bed mobility comments: Increased time and min assist for initiating moving legs off of the bed.    Transfers Overall transfer level: Needs assistance Equipment used: Rolling walker (2 wheels) Transfers: Sit to/from Stand, Bed to chair/wheelchair/BSC Sit to Stand: Min assist     Step pivot transfers: Min assist     General transfer comment: Mod instructional cueing for pushing up from the surface he is sitting on adn then reaching back.     Balance Overall balance assessment: Needs assistance Sitting-balance support: No upper extremity supported, Feet supported Sitting balance-Leahy Scale: Fair     Standing balance support: Bilateral upper extremity supported, During functional activity, Reliant on assistive device for balance Standing balance-Leahy Scale: Poor Standing balance comment: Posterior LOB                           ADL either performed or assessed with clinical judgement   ADL Overall ADL's : Needs assistance/impaired Eating/Feeding: Set up Eating/Feeding Details (indicate cue type and reason): able to use utensils for eating part of breakfast, jerking noted at times leading to increased food spillage Grooming: Minimal assistance;Wash/dry hands;Wash/dry face;Standing                   Toilet Transfer: Minimal assistance;Ambulation Toilet Transfer Details (indicate cue type and reason): standing to ambulate Toileting- Clothing Manipulation and Hygiene: Sit to/from stand;Moderate assistance Toileting - Clothing Manipulation Details (indicate cue type and reason): for hospital gown     Functional mobility during ADLs: Minimal assistance;Rolling walker (2 wheels) General ADL Comments: Pt with decreased awarness with toileting, needed assist to help guide primofit to the toilet when standing to urinate.  Mod instructional  cueing for hand placement as pt  tends to pull up on the RW.  Increased impulsivity with eating.  Attempts to take very large bites without awareness to cut it down with his fork or spoon.  HR in the low 100's in sitting increasing up to 120 with ambulation to the bathroom.  BP in sitting at 153/99 prior to activity and then 148/84 in sitting post activity.  Nursing made aware               Cognition Arousal/Alertness: Awake/alert Behavior During Therapy: Flat affect Overall Cognitive Status: Impaired/Different from baseline Area of Impairment: Orientation, Attention, Memory, Awareness, Safety/judgement, Following commands                 Orientation Level: Time, Situation Current Attention Level: Focused   Following Commands: Follows one step commands inconsistently Safety/Judgement: Decreased awareness of deficits, Decreased awareness of safety Awareness: Intellectual   General Comments: Pt with slow processing throughout session.  As therapist would ask questions regarding PLOF or setup at home, pt only responding to 25 % of them with increased delay and therapist repeating question.  Flat affect with myoclonic jerking in UEs.               Pertinent Vitals/ Pain       Pain Assessment Pain Assessment: Faces Faces Pain Scale: No hurt         Frequency  Min 2X/week        Progress Toward Goals  OT Goals(current goals can now be found in the care plan section)  Progress towards OT goals: Progressing toward goals  Acute Rehab OT Goals Patient Stated Goal: Pt did not state Potential to Achieve Goals: Good  Plan Discharge plan remains appropriate       AM-PAC OT "6 Clicks" Daily Activity     Outcome Measure   Help from another person eating meals?: A Little Help from another person taking care of personal grooming?: A Little Help from another person toileting, which includes using toliet, bedpan, or urinal?: A Little Help from another person bathing (including washing, rinsing, drying)?:  A Little Help from another person to put on and taking off regular upper body clothing?: A Little Help from another person to put on and taking off regular lower body clothing?: A Little 6 Click Score: 18    End of Session Equipment Utilized During Treatment: Gait belt;Rolling walker (2 wheels)  OT Visit Diagnosis: Unsteadiness on feet (R26.81);Other symptoms and signs involving cognitive function   Activity Tolerance Patient tolerated treatment well   Patient Left in chair;with call bell/phone within reach;with chair alarm set   Nurse Communication Mobility status;Other (comment) (elevated BP)        Time: 8850-2774 OT Time Calculation (min): 25 min  Charges: OT General Charges $OT Visit: 1 Visit OT Evaluation $OT Eval Moderate Complexity: 1 Mod OT Treatments $Self Care/Home Management : 23-37 mins  Janara Klett OTR/L 01/08/2022, 9:16 AM

## 2022-01-08 NOTE — Social Work (Signed)
Called Mitchell [name] w/ [facility]- left voice message to return call.   Thurmond Butts, MSW, LCSW Clinical Social Worker

## 2022-01-09 ENCOUNTER — Inpatient Hospital Stay (HOSPITAL_COMMUNITY)
Admission: EM | Admit: 2022-01-09 | Discharge: 2022-01-15 | DRG: 871 | Disposition: A | Discharge status: Skilled Nursing Facility | Payer: Medicare Other | Attending: Internal Medicine | Admitting: Internal Medicine

## 2022-01-09 DIAGNOSIS — E876 Hypokalemia: Secondary | ICD-10-CM | POA: Diagnosis present

## 2022-01-09 DIAGNOSIS — G4730 Sleep apnea, unspecified: Secondary | ICD-10-CM | POA: Diagnosis present

## 2022-01-09 DIAGNOSIS — Z7982 Long term (current) use of aspirin: Secondary | ICD-10-CM

## 2022-01-09 DIAGNOSIS — R6521 Severe sepsis with septic shock: Secondary | ICD-10-CM | POA: Diagnosis present

## 2022-01-09 DIAGNOSIS — Z79899 Other long term (current) drug therapy: Secondary | ICD-10-CM

## 2022-01-09 DIAGNOSIS — Z8673 Personal history of transient ischemic attack (TIA), and cerebral infarction without residual deficits: Secondary | ICD-10-CM

## 2022-01-09 DIAGNOSIS — F259 Schizoaffective disorder, unspecified: Secondary | ICD-10-CM | POA: Diagnosis present

## 2022-01-09 DIAGNOSIS — T434X5A Adverse effect of butyrophenone and thiothixene neuroleptics, initial encounter: Secondary | ICD-10-CM | POA: Diagnosis present

## 2022-01-09 DIAGNOSIS — Z794 Long term (current) use of insulin: Secondary | ICD-10-CM

## 2022-01-09 DIAGNOSIS — Y95 Nosocomial condition: Secondary | ICD-10-CM | POA: Diagnosis present

## 2022-01-09 DIAGNOSIS — E119 Type 2 diabetes mellitus without complications: Secondary | ICD-10-CM

## 2022-01-09 DIAGNOSIS — G252 Other specified forms of tremor: Secondary | ICD-10-CM | POA: Diagnosis present

## 2022-01-09 DIAGNOSIS — G928 Other toxic encephalopathy: Secondary | ICD-10-CM | POA: Diagnosis present

## 2022-01-09 DIAGNOSIS — J189 Pneumonia, unspecified organism: Secondary | ICD-10-CM | POA: Diagnosis present

## 2022-01-09 DIAGNOSIS — R4189 Other symptoms and signs involving cognitive functions and awareness: Secondary | ICD-10-CM | POA: Diagnosis present

## 2022-01-09 DIAGNOSIS — J302 Other seasonal allergic rhinitis: Secondary | ICD-10-CM | POA: Diagnosis present

## 2022-01-09 DIAGNOSIS — F319 Bipolar disorder, unspecified: Secondary | ICD-10-CM | POA: Diagnosis present

## 2022-01-09 DIAGNOSIS — R918 Other nonspecific abnormal finding of lung field: Secondary | ICD-10-CM

## 2022-01-09 DIAGNOSIS — B370 Candidal stomatitis: Secondary | ICD-10-CM | POA: Diagnosis present

## 2022-01-09 DIAGNOSIS — I959 Hypotension, unspecified: Secondary | ICD-10-CM

## 2022-01-09 DIAGNOSIS — D6489 Other specified anemias: Secondary | ICD-10-CM | POA: Diagnosis present

## 2022-01-09 DIAGNOSIS — R4182 Altered mental status, unspecified: Secondary | ICD-10-CM | POA: Diagnosis not present

## 2022-01-09 DIAGNOSIS — E1165 Type 2 diabetes mellitus with hyperglycemia: Secondary | ICD-10-CM | POA: Diagnosis present

## 2022-01-09 DIAGNOSIS — Z7984 Long term (current) use of oral hypoglycemic drugs: Secondary | ICD-10-CM

## 2022-01-09 DIAGNOSIS — A419 Sepsis, unspecified organism: Secondary | ICD-10-CM | POA: Diagnosis not present

## 2022-01-09 DIAGNOSIS — N179 Acute kidney failure, unspecified: Secondary | ICD-10-CM | POA: Diagnosis present

## 2022-01-09 DIAGNOSIS — F209 Schizophrenia, unspecified: Secondary | ICD-10-CM | POA: Diagnosis present

## 2022-01-09 DIAGNOSIS — K5900 Constipation, unspecified: Secondary | ICD-10-CM | POA: Diagnosis present

## 2022-01-09 DIAGNOSIS — I10 Essential (primary) hypertension: Secondary | ICD-10-CM | POA: Diagnosis present

## 2022-01-09 DIAGNOSIS — R571 Hypovolemic shock: Secondary | ICD-10-CM | POA: Diagnosis present

## 2022-01-09 LAB — CBG MONITORING, ED: Glucose-Capillary: 243 mg/dL — ABNORMAL HIGH (ref 70–99)

## 2022-01-09 LAB — HEPATITIS C ANTIBODY: HCV Ab: NONREACTIVE — AB

## 2022-01-09 MED ORDER — ONDANSETRON HCL 4 MG/2ML IJ SOLN
4.0000 mg | Freq: Once | INTRAMUSCULAR | Status: AC
  Administered 2022-01-10: 4 mg via INTRAVENOUS
  Filled 2022-01-09: qty 2

## 2022-01-09 NOTE — ED Provider Notes (Signed)
[facility] EMERGENCY DEPARTMENT Provider Note   CSN: [id number] Arrival date & time: 01/09/22  2343     History {Add pertinent medical, surgical, social history, OB history to HPI:1} No chief complaint on file.   Mitchell [name] is a 61 y.o. male.  HPI     Home Medications Prior to Admission medications   Medication Sig Start Date End Date Taking? Authorizing Provider  amLODipine (NORVASC) 10 MG tablet Take 1 tablet (10 mg total) by mouth at bedtime. 05/27/21 01/06/22  Briant Cedar, MD  aspirin EC 81 MG EC tablet Take 1 tablet (81 mg total) by mouth daily. Swallow whole. 05/28/21   Briant Cedar, MD  atenolol (TENORMIN) 25 MG tablet Take 0.5 tablets (12.5 mg total) by mouth daily. 05/28/21 01/06/22  Briant Cedar, MD  cloNIDine (CATAPRES) 0.2 MG tablet Take 0.2 mg by mouth 2 (two) times daily.    [provider]  cloZAPine (CLOZARIL) 25 MG tablet Take 3 tablets (75 mg total) by mouth at bedtime. Patient taking differently: Take 25-75 mg by mouth See admin instructions. Taking 25 mg in the AM and 75 mg at bedtime 05/27/21 01/06/22  Briant Cedar, MD  clozapine (CLOZARIL) 50 MG tablet Take 0.5 tablets (25 mg total) by mouth in the morning. 01/08/22 02/07/22  Rolanda Lundborg, MD  donepezil (ARICEPT) 5 MG tablet Take 1 tablet (5 mg total) by mouth at bedtime. 05/27/21 01/06/22  Briant Cedar, MD  haloperidol (HALDOL) 5 MG tablet Take 1 tablet (5 mg total) by mouth daily. 01/08/22 02/07/22  Rolanda Lundborg, MD  haloperidol (HALDOL) 5 MG tablet Take 1 tablet (5 mg total) by mouth at bedtime. 01/08/22 02/07/22  Rolanda Lundborg, MD  insulin aspart (NOVOLOG) 100 UNIT/ML injection Inject 0-9 Units into the skin 3 (three) times daily with meals. Patient taking differently: Inject 7 Units into the skin 3 (three) times daily with meals. 04/23/21   Lucky Rathke, FNP  insulin glargine (LANTUS) 100 UNIT/ML injection Inject 15 Units into  the skin 2 (two) times daily.    [provider]  lithium carbonate (ESKALITH) 450 MG CR tablet Take 1 tablet (450 mg total) by mouth every 12 (twelve) hours. 01/15/22   Rolanda Lundborg, MD  metFORMIN (GLUCOPHAGE) 1000 MG tablet Take 1,000 mg by mouth 2 (two) times daily. 05/11/21   [provider]  senna (SENOKOT) 8.6 MG TABS tablet Take 2 tablets (17.2 mg total) by mouth daily. 05/28/21   Briant Cedar, MD      Allergies    Patient has no known allergies.    Review of Systems   Review of Systems  Physical Exam Updated Vital Signs Temp 98.7 F (37.1 C)  Physical Exam  ED Results / Procedures / Treatments   Labs (all labs ordered are listed, but only abnormal results are displayed) Labs Reviewed - No data to display  EKG None  Radiology No results found.  Procedures Procedures  {Document cardiac monitor, telemetry assessment procedure when appropriate:1}  Medications Ordered in ED Medications - No data to display  ED Course/ Medical Decision Making/ A&P                           Medical Decision Making  ***  {Document critical care time when appropriate:1} {Document review of labs and clinical decision tools ie heart score, Chads2Vasc2 etc:1}  {Document your independent review of radiology images, and any outside records:1} {  Document your discussion with family members, caretakers, and with consultants:1} {Document social determinants of health affecting pt's care:1} {Document your decision making why or why not admission, treatments were needed:1} Final Clinical Impression(s) / ED Diagnoses Final diagnoses:  None    Rx / DC Orders ED Discharge Orders     None

## 2022-01-10 ENCOUNTER — Emergency Department (HOSPITAL_COMMUNITY): Payer: Medicare Other

## 2022-01-10 ENCOUNTER — Other Ambulatory Visit: Payer: Self-pay

## 2022-01-10 ENCOUNTER — Inpatient Hospital Stay (HOSPITAL_COMMUNITY): Payer: Medicare Other

## 2022-01-10 DIAGNOSIS — J189 Pneumonia, unspecified organism: Secondary | ICD-10-CM | POA: Diagnosis present

## 2022-01-10 DIAGNOSIS — I959 Hypotension, unspecified: Secondary | ICD-10-CM | POA: Diagnosis not present

## 2022-01-10 DIAGNOSIS — G928 Other toxic encephalopathy: Secondary | ICD-10-CM | POA: Diagnosis present

## 2022-01-10 DIAGNOSIS — R4189 Other symptoms and signs involving cognitive functions and awareness: Secondary | ICD-10-CM | POA: Diagnosis present

## 2022-01-10 DIAGNOSIS — R6521 Severe sepsis with septic shock: Secondary | ICD-10-CM | POA: Diagnosis present

## 2022-01-10 DIAGNOSIS — R008 Other abnormalities of heart beat: Secondary | ICD-10-CM | POA: Diagnosis not present

## 2022-01-10 DIAGNOSIS — B370 Candidal stomatitis: Secondary | ICD-10-CM | POA: Diagnosis present

## 2022-01-10 DIAGNOSIS — E876 Hypokalemia: Secondary | ICD-10-CM | POA: Diagnosis present

## 2022-01-10 DIAGNOSIS — Z79899 Other long term (current) drug therapy: Secondary | ICD-10-CM | POA: Diagnosis not present

## 2022-01-10 DIAGNOSIS — R571 Hypovolemic shock: Secondary | ICD-10-CM | POA: Diagnosis present

## 2022-01-10 DIAGNOSIS — Z794 Long term (current) use of insulin: Secondary | ICD-10-CM | POA: Diagnosis not present

## 2022-01-10 DIAGNOSIS — I10 Essential (primary) hypertension: Secondary | ICD-10-CM | POA: Diagnosis present

## 2022-01-10 DIAGNOSIS — T434X5A Adverse effect of butyrophenone and thiothixene neuroleptics, initial encounter: Secondary | ICD-10-CM | POA: Diagnosis present

## 2022-01-10 DIAGNOSIS — F319 Bipolar disorder, unspecified: Secondary | ICD-10-CM | POA: Diagnosis present

## 2022-01-10 DIAGNOSIS — A419 Sepsis, unspecified organism: Secondary | ICD-10-CM | POA: Diagnosis present

## 2022-01-10 DIAGNOSIS — Z8673 Personal history of transient ischemic attack (TIA), and cerebral infarction without residual deficits: Secondary | ICD-10-CM | POA: Diagnosis not present

## 2022-01-10 DIAGNOSIS — E1165 Type 2 diabetes mellitus with hyperglycemia: Secondary | ICD-10-CM | POA: Diagnosis present

## 2022-01-10 DIAGNOSIS — N179 Acute kidney failure, unspecified: Secondary | ICD-10-CM

## 2022-01-10 DIAGNOSIS — D6489 Other specified anemias: Secondary | ICD-10-CM | POA: Diagnosis present

## 2022-01-10 DIAGNOSIS — K5900 Constipation, unspecified: Secondary | ICD-10-CM | POA: Diagnosis present

## 2022-01-10 DIAGNOSIS — G4730 Sleep apnea, unspecified: Secondary | ICD-10-CM | POA: Diagnosis present

## 2022-01-10 DIAGNOSIS — F259 Schizoaffective disorder, unspecified: Secondary | ICD-10-CM | POA: Diagnosis present

## 2022-01-10 DIAGNOSIS — Z7984 Long term (current) use of oral hypoglycemic drugs: Secondary | ICD-10-CM | POA: Diagnosis not present

## 2022-01-10 DIAGNOSIS — J302 Other seasonal allergic rhinitis: Secondary | ICD-10-CM | POA: Diagnosis present

## 2022-01-10 DIAGNOSIS — R4182 Altered mental status, unspecified: Secondary | ICD-10-CM | POA: Diagnosis present

## 2022-01-10 DIAGNOSIS — E861 Hypovolemia: Secondary | ICD-10-CM | POA: Diagnosis not present

## 2022-01-10 DIAGNOSIS — Y95 Nosocomial condition: Secondary | ICD-10-CM | POA: Diagnosis present

## 2022-01-10 DIAGNOSIS — Z7982 Long term (current) use of aspirin: Secondary | ICD-10-CM | POA: Diagnosis not present

## 2022-01-10 DIAGNOSIS — G9340 Encephalopathy, unspecified: Secondary | ICD-10-CM | POA: Diagnosis not present

## 2022-01-10 DIAGNOSIS — G252 Other specified forms of tremor: Secondary | ICD-10-CM | POA: Diagnosis present

## 2022-01-10 LAB — POCT I-STAT 7, (LYTES, BLD GAS, ICA,H+H)
Acid-base deficit: 3 mmol/L — ABNORMAL HIGH (ref 0.0–2.0)
Bicarbonate: 22.1 mmol/L (ref 20.0–28.0)
Calcium, Ion: 1.29 mmol/L (ref 1.15–1.40)
HCT: 31 % — ABNORMAL LOW (ref 39.0–52.0)
Hemoglobin: 10.5 g/dL — ABNORMAL LOW (ref 13.0–17.0)
O2 Saturation: 92 %
Patient temperature: 98.5
Potassium: 4 mmol/L (ref 3.5–5.1)
Sodium: 139 mmol/L (ref 135–145)
TCO2: 23 mmol/L (ref 22–32)
pCO2 arterial: 40.3 mmHg (ref 32–48)
pH, Arterial: 7.347 — ABNORMAL LOW (ref 7.35–7.45)
pO2, Arterial: 67 mmHg — ABNORMAL LOW (ref 83–108)

## 2022-01-10 LAB — ECHOCARDIOGRAM COMPLETE
AR max vel: 2.5 cm2
AV Area VTI: 2.52 cm2
AV Area mean vel: 2.49 cm2
AV Mean grad: 4 mmHg
AV Peak grad: 6.6 mmHg
Ao pk vel: 1.28 m/s
Area-P 1/2: 5.13 cm2
S' Lateral: 2.8 cm
Weight: 3619.07 oz

## 2022-01-10 LAB — URINALYSIS, ROUTINE W REFLEX MICROSCOPIC
Bilirubin Urine: NEGATIVE
Glucose, UA: NEGATIVE mg/dL
Hgb urine dipstick: NEGATIVE
Ketones, ur: NEGATIVE mg/dL
Leukocytes,Ua: NEGATIVE
Nitrite: NEGATIVE
Protein, ur: 30 mg/dL — AB
Specific Gravity, Urine: 1.024 (ref 1.005–1.030)
pH: 5 (ref 5.0–8.0)

## 2022-01-10 LAB — COMPREHENSIVE METABOLIC PANEL
ALT: 21 U/L (ref 0–44)
ALT: 17 U/L (ref 0–44)
ALT: 17 U/L (ref 0–44)
AST: 16 U/L (ref 15–41)
AST: 15 U/L (ref 15–41)
AST: 21 U/L (ref 15–41)
Albumin: 3.7 g/dL (ref 3.5–5.0)
Albumin: 3.3 g/dL — ABNORMAL LOW (ref 3.5–5.0)
Albumin: 3.4 g/dL — ABNORMAL LOW (ref 3.5–5.0)
Alkaline Phosphatase: 144 U/L — ABNORMAL HIGH (ref 38–126)
Alkaline Phosphatase: 125 U/L (ref 38–126)
Alkaline Phosphatase: 124 U/L (ref 38–126)
Anion gap: 10 (ref 5–15)
Anion gap: 12 (ref 5–15)
Anion gap: 9 (ref 5–15)
BUN: 26 mg/dL — ABNORMAL HIGH (ref 6–20)
BUN: 25 mg/dL — ABNORMAL HIGH (ref 6–20)
BUN: 19 mg/dL (ref 6–20)
CO2: 22 mmol/L (ref 22–32)
CO2: 17 mmol/L — ABNORMAL LOW (ref 22–32)
CO2: 22 mmol/L (ref 22–32)
Calcium: 9.2 mg/dL (ref 8.9–10.3)
Calcium: 8.7 mg/dL — ABNORMAL LOW (ref 8.9–10.3)
Calcium: 9.1 mg/dL (ref 8.9–10.3)
Chloride: 104 mmol/L (ref 98–111)
Chloride: 106 mmol/L (ref 98–111)
Chloride: 107 mmol/L (ref 98–111)
Creatinine, Ser: 2.42 mg/dL — ABNORMAL HIGH (ref 0.61–1.24)
Creatinine, Ser: 2 mg/dL — ABNORMAL HIGH (ref 0.61–1.24)
Creatinine, Ser: 1.48 mg/dL — ABNORMAL HIGH (ref 0.61–1.24)
GFR, Estimated: 30 mL/min — ABNORMAL LOW (ref >60)
GFR, Estimated: 38 mL/min — ABNORMAL LOW (ref >60)
GFR, Estimated: 54 mL/min — ABNORMAL LOW (ref >60)
Glucose, Bld: 287 mg/dL — ABNORMAL HIGH (ref 70–99)
Glucose, Bld: 319 mg/dL — ABNORMAL HIGH (ref 70–99)
Glucose, Bld: 161 mg/dL — ABNORMAL HIGH (ref 70–99)
Potassium: 5.2 mmol/L — ABNORMAL HIGH (ref 3.5–5.1)
Potassium: 5.3 mmol/L — ABNORMAL HIGH (ref 3.5–5.1)
Potassium: 4.6 mmol/L (ref 3.5–5.1)
Sodium: 136 mmol/L (ref 135–145)
Sodium: 135 mmol/L (ref 135–145)
Sodium: 138 mmol/L (ref 135–145)
Total Bilirubin: 0.5 mg/dL (ref 0.3–1.2)
Total Bilirubin: 0.8 mg/dL (ref 0.3–1.2)
Total Bilirubin: 0.9 mg/dL (ref 0.3–1.2)
Total Protein: 7.6 g/dL (ref 6.5–8.1)
Total Protein: 6.6 g/dL (ref 6.5–8.1)
Total Protein: 7 g/dL (ref 6.5–8.1)

## 2022-01-10 LAB — AMMONIA: Ammonia: 23 umol/L (ref 9–35)

## 2022-01-10 LAB — CBC
HCT: 36.5 % — ABNORMAL LOW (ref 39.0–52.0)
Hemoglobin: 11.7 g/dL — ABNORMAL LOW (ref 13.0–17.0)
MCH: 30.3 pg (ref 26.0–34.0)
MCHC: 32.1 g/dL (ref 30.0–36.0)
MCV: 94.6 fL (ref 80.0–100.0)
Platelets: 254 10*3/uL (ref 150–400)
RBC: 3.86 MIL/uL — ABNORMAL LOW (ref 4.22–5.81)
RDW: 13.9 % (ref 11.5–15.5)
WBC: 9.9 10*3/uL (ref 4.0–10.5)
nRBC: 0 % (ref 0.0–0.2)

## 2022-01-10 LAB — CBC WITH DIFFERENTIAL/PLATELET
Abs Immature Granulocytes: 0.03 10*3/uL (ref 0.00–0.07)
Basophils Absolute: 0 10*3/uL (ref 0.0–0.1)
Basophils Relative: 0 %
Eosinophils Absolute: 0.3 10*3/uL (ref 0.0–0.5)
Eosinophils Relative: 3 %
HCT: 37.9 % — ABNORMAL LOW (ref 39.0–52.0)
Hemoglobin: 12.3 g/dL — ABNORMAL LOW (ref 13.0–17.0)
Immature Granulocytes: 0 %
Lymphocytes Relative: 34 %
Lymphs Abs: 2.9 10*3/uL (ref 0.7–4.0)
MCH: 30.3 pg (ref 26.0–34.0)
MCHC: 32.5 g/dL (ref 30.0–36.0)
MCV: 93.3 fL (ref 80.0–100.0)
Monocytes Absolute: 0.5 10*3/uL (ref 0.1–1.0)
Monocytes Relative: 6 %
Neutro Abs: 4.8 10*3/uL (ref 1.7–7.7)
Neutrophils Relative %: 57 %
Platelets: 299 10*3/uL (ref 150–400)
RBC: 4.06 MIL/uL — ABNORMAL LOW (ref 4.22–5.81)
RDW: 14.4 % (ref 11.5–15.5)
WBC: 8.5 10*3/uL (ref 4.0–10.5)
nRBC: 0 % (ref 0.0–0.2)

## 2022-01-10 LAB — BLOOD GAS, VENOUS
Acid-base deficit: 1.8 mmol/L (ref 0.0–2.0)
Bicarbonate: 24.7 mmol/L (ref 20.0–28.0)
Drawn by: 164
O2 Saturation: 88.8 %
Patient temperature: 37
pCO2, Ven: 48 mmHg (ref 44–60)
pH, Ven: 7.32 (ref 7.25–7.43)
pO2, Ven: 59 mmHg — ABNORMAL HIGH (ref 32–45)

## 2022-01-10 LAB — I-STAT VENOUS BLOOD GAS, ED
Acid-base deficit: 5 mmol/L — ABNORMAL HIGH (ref 0.0–2.0)
Bicarbonate: 17.2 mmol/L — ABNORMAL LOW (ref 20.0–28.0)
Calcium, Ion: 0.88 mmol/L — CL (ref 1.15–1.40)
HCT: 34 % — ABNORMAL LOW (ref 39.0–52.0)
Hemoglobin: 11.6 g/dL — ABNORMAL LOW (ref 13.0–17.0)
O2 Saturation: 98 %
Potassium: 4.8 mmol/L (ref 3.5–5.1)
Sodium: 134 mmol/L — ABNORMAL LOW (ref 135–145)
TCO2: 18 mmol/L — ABNORMAL LOW (ref 22–32)
pCO2, Ven: 23.1 mmHg — ABNORMAL LOW (ref 44–60)
pH, Ven: 7.48 — ABNORMAL HIGH (ref 7.25–7.43)
pO2, Ven: 93 mmHg — ABNORMAL HIGH (ref 32–45)

## 2022-01-10 LAB — TSH: TSH: 1.805 u[IU]/mL (ref 0.350–4.500)

## 2022-01-10 LAB — BRAIN NATRIURETIC PEPTIDE: B Natriuretic Peptide: 3.3 pg/mL (ref 0.0–100.0)

## 2022-01-10 LAB — RAPID URINE DRUG SCREEN, HOSP PERFORMED
Amphetamines: NOT DETECTED
Barbiturates: NOT DETECTED
Benzodiazepines: NOT DETECTED
Cocaine: NOT DETECTED
Opiates: NOT DETECTED
Tetrahydrocannabinol: NOT DETECTED

## 2022-01-10 LAB — PROTIME-INR
INR: 1.1 (ref 0.8–1.2)
Prothrombin Time: 14.3 seconds (ref 11.4–15.2)

## 2022-01-10 LAB — GLUCOSE, CAPILLARY
Glucose-Capillary: 106 mg/dL — ABNORMAL HIGH (ref 70–99)
Glucose-Capillary: 110 mg/dL — ABNORMAL HIGH (ref 70–99)
Glucose-Capillary: 173 mg/dL — ABNORMAL HIGH (ref 70–99)
Glucose-Capillary: 192 mg/dL — ABNORMAL HIGH (ref 70–99)
Glucose-Capillary: 174 mg/dL — ABNORMAL HIGH (ref 70–99)

## 2022-01-10 LAB — ACETAMINOPHEN LEVEL: Acetaminophen (Tylenol), Serum: <10 ug/mL — ABNORMAL LOW (ref 10–30)

## 2022-01-10 LAB — CORTISOL: Cortisol, Plasma: 9.9 ug/dL

## 2022-01-10 LAB — PROCALCITONIN: Procalcitonin: <0.1 ng/mL

## 2022-01-10 LAB — APTT: aPTT: 29 seconds (ref 24–36)

## 2022-01-10 LAB — MRSA NEXT GEN BY PCR, NASAL: MRSA by PCR Next Gen: NOT DETECTED

## 2022-01-10 LAB — CK: Total CK: 343 U/L (ref 49–397)

## 2022-01-10 LAB — POC OCCULT BLOOD, ED: Fecal Occult Bld: NEGATIVE

## 2022-01-10 LAB — CBG MONITORING, ED: Glucose-Capillary: 272 mg/dL — ABNORMAL HIGH (ref 70–99)

## 2022-01-10 LAB — MAGNESIUM: Magnesium: 2.2 mg/dL (ref 1.7–2.4)

## 2022-01-10 LAB — SALICYLATE LEVEL: Salicylate Lvl: <7 mg/dL — ABNORMAL LOW (ref 7.0–30.0)

## 2022-01-10 LAB — ETHANOL: Alcohol, Ethyl (B): <10 mg/dL (ref <10)

## 2022-01-10 LAB — LACTIC ACID, PLASMA
Lactic Acid, Venous: 2.8 mmol/L (ref 0.5–1.9)
Lactic Acid, Venous: 2.9 mmol/L (ref 0.5–1.9)
Lactic Acid, Venous: 4 mmol/L (ref 0.5–1.9)
Lactic Acid, Venous: 1.6 mmol/L (ref 0.5–1.9)

## 2022-01-10 LAB — LITHIUM LEVEL
Lithium Lvl: 0.59 mmol/L — ABNORMAL LOW (ref 0.60–1.20)
Lithium Lvl: 0.77 mmol/L (ref 0.60–1.20)

## 2022-01-10 LAB — TROPONIN I (HIGH SENSITIVITY)
Troponin I (High Sensitivity): <2 ng/L (ref <18)
Troponin I (High Sensitivity): 3 ng/L (ref <18)

## 2022-01-10 MED ORDER — INSULIN ASPART 100 UNIT/ML IJ SOLN
0.0000 [IU] | INTRAMUSCULAR | Status: DC
  Administered 2022-01-10: 3 [IU] via SUBCUTANEOUS
  Administered 2022-01-10: 8 [IU] via SUBCUTANEOUS
  Administered 2022-01-10 – 2022-01-11 (×3): 3 [IU] via SUBCUTANEOUS
  Administered 2022-01-11: 2 [IU] via SUBCUTANEOUS
  Administered 2022-01-11: 3 [IU] via SUBCUTANEOUS

## 2022-01-10 MED ORDER — NOREPINEPHRINE 4 MG/250ML-% IV SOLN: 2.0000 ug/min | INTRAVENOUS | Status: DC

## 2022-01-10 MED ORDER — DEXTROSE 50 % IV SOLN
25.0000 mL | Freq: Once | INTRAVENOUS | Status: AC
  Administered 2022-01-10: 25 mL via INTRAVENOUS
  Filled 2022-01-10: qty 50

## 2022-01-10 MED ORDER — SODIUM CHLORIDE 0.9 % IV SOLN
2.0000 g | Freq: Once | INTRAVENOUS | Status: AC
  Administered 2022-01-10: 2 g via INTRAVENOUS
  Filled 2022-01-10: qty 12.5

## 2022-01-10 MED ORDER — POLYETHYLENE GLYCOL 3350 17 G PO PACK: 17.0000 g | PACK | Freq: Every day | ORAL | Status: DC | PRN

## 2022-01-10 MED ORDER — CALCIUM GLUCONATE-NACL 1-0.675 GM/50ML-% IV SOLN
1.0000 g | Freq: Once | INTRAVENOUS | Status: DC
  Filled 2022-01-10: qty 50

## 2022-01-10 MED ORDER — SODIUM CHLORIDE 0.9 % IV SOLN: 250.0000 mL | INTRAVENOUS | Status: DC

## 2022-01-10 MED ORDER — NOREPINEPHRINE 4 MG/250ML-% IV SOLN
INTRAVENOUS | Status: AC
  Administered 2022-01-10: 20 ug/min via INTRAVENOUS
  Filled 2022-01-10: qty 250

## 2022-01-10 MED ORDER — INSULIN ASPART 100 UNIT/ML IV SOLN
10.0000 [IU] | Freq: Once | INTRAVENOUS | Status: AC
  Administered 2022-01-10: 10 [IU] via INTRAVENOUS

## 2022-01-10 MED ORDER — CALCIUM GLUCONATE-NACL 1-0.675 GM/50ML-% IV SOLN
1.0000 g | Freq: Once | INTRAVENOUS | Status: AC
  Administered 2022-01-10: 1000 mg via INTRAVENOUS
  Filled 2022-01-10: qty 50

## 2022-01-10 MED ORDER — BISACODYL 10 MG RE SUPP
10.0000 mg | Freq: Every day | RECTAL | Status: DC
  Administered 2022-01-10 – 2022-01-11 (×2): 10 mg via RECTAL
  Filled 2022-01-10 (×2): qty 1

## 2022-01-10 MED ORDER — LACTATED RINGERS IV SOLN: INTRAVENOUS | Status: AC

## 2022-01-10 MED ORDER — LACTATED RINGERS IV BOLUS
2000.0000 mL | Freq: Once | INTRAVENOUS | Status: AC
  Administered 2022-01-10: 2000 mL via INTRAVENOUS

## 2022-01-10 MED ORDER — DOCUSATE SODIUM 100 MG PO CAPS: 100.0000 mg | ORAL_CAPSULE | Freq: Two times a day (BID) | ORAL | Status: DC | PRN

## 2022-01-10 MED ORDER — CHLORHEXIDINE GLUCONATE CLOTH 2 % EX PADS
6.0000 | MEDICATED_PAD | Freq: Every day | CUTANEOUS | Status: DC
  Administered 2022-01-10: 6 via TOPICAL

## 2022-01-10 MED ORDER — NOREPINEPHRINE 4 MG/250ML-% IV SOLN
0.0000 ug/min | INTRAVENOUS | Status: DC
  Administered 2022-01-10: 19 ug/min via INTRAVENOUS
  Filled 2022-01-10: qty 250

## 2022-01-10 MED ORDER — ORAL CARE MOUTH RINSE: 15.0000 mL | OROMUCOSAL | Status: DC | PRN

## 2022-01-10 MED ORDER — SODIUM CHLORIDE 0.9 % IV SOLN
2.0000 g | Freq: Two times a day (BID) | INTRAVENOUS | Status: DC
  Administered 2022-01-10 (×2): 2 g via INTRAVENOUS
  Filled 2022-01-10 (×2): qty 12.5

## 2022-01-10 MED ORDER — LACTATED RINGERS IV BOLUS
1000.0000 mL | Freq: Once | INTRAVENOUS | Status: AC
  Administered 2022-01-10: 1000 mL via INTRAVENOUS

## 2022-01-10 MED ORDER — VANCOMYCIN HCL 1750 MG/350ML IV SOLN
1750.0000 mg | Freq: Once | INTRAVENOUS | Status: AC
  Administered 2022-01-10: 1750 mg via INTRAVENOUS
  Filled 2022-01-10: qty 350

## 2022-01-10 MED ORDER — INSULIN GLARGINE-YFGN 100 UNIT/ML ~~LOC~~ SOLN
10.0000 [IU] | Freq: Two times a day (BID) | SUBCUTANEOUS | Status: DC
  Filled 2022-01-10 (×2): qty 0.1

## 2022-01-10 MED ORDER — HEPARIN SODIUM (PORCINE) 5000 UNIT/ML IJ SOLN
5000.0000 [IU] | Freq: Three times a day (TID) | INTRAMUSCULAR | Status: AC
  Administered 2022-01-10 – 2022-01-13 (×12): 5000 [IU] via SUBCUTANEOUS
  Filled 2022-01-10 (×12): qty 1

## 2022-01-10 MED ORDER — ACETAMINOPHEN 650 MG RE SUPP
650.0000 mg | Freq: Four times a day (QID) | RECTAL | Status: DC | PRN
  Administered 2022-01-10: 650 mg via RECTAL
  Filled 2022-01-10: qty 1

## 2022-01-10 MED ORDER — CHLORHEXIDINE GLUCONATE CLOTH 2 % EX PADS
6.0000 | MEDICATED_PAD | CUTANEOUS | Status: DC
  Administered 2022-01-10 – 2022-01-14 (×5): 6 via TOPICAL

## 2022-01-10 NOTE — Progress Notes (Signed)
Pharmacy generated IV Team consult for US guided IV due to vasopressors. Upon arrival, primary RN states pt has 3 IVs and vasopressors being titrated down, and most likely to be discontinued. Primary RN to place consult for US guided IV if vasopressors continue. IV Team will remain available as needed.

## 2022-01-10 NOTE — ED Notes (Signed)
BP Cuff checked and readjusted per MD order

## 2022-01-10 NOTE — Progress Notes (Signed)
  Echocardiogram 2D Echocardiogram has been performed.  Mitchell [name] 01/10/2022, 8:57 AM

## 2022-01-10 NOTE — Progress Notes (Signed)
Went to place EEG, set up, and then realized this pt had already had EEG. Spoke with DR. Bhagat and EEG is not needed at this time.

## 2022-01-10 NOTE — Progress Notes (Signed)
eLink Physician-Brief Progress Note Patient Name: Mitchell [name] DOB: [birth year] MRN: [id number]   Date of Service  01/10/2022  HPI/Events of Note  61/M who was brought to the ED for AMS and hypotension. On initial evaluation, he was lethargic, with emesis on his shirt, SBP 50s, with low grade fever. Workup showed Cr 2.5, and a lactate of 2.8.   eICU Interventions  Hypotension, unclear etiology - Had been started on levophed in the ED, but appears to have been weaned off pressors following volume resuscitation.  ?distributive/sepsis    - Started on empiric antibiotics. Will follow cultures and deescalate as warranted.     - Given initial bolus in ED.     - Trend WBC, lactate, temperature curve.  ?cardiogenic      - follow up echo results ?medication related - will hold home antihypertensives.           [place] 01/10/2022, 6:23 AM

## 2022-01-10 NOTE — Plan of Care (Signed)
  Problem: Fluid Volume: Goal: Ability to maintain a balanced intake and output will improve Outcome: Progressing   Problem: Skin Integrity: Goal: Risk for impaired skin integrity will decrease Outcome: Progressing   Problem: Education: Goal: Knowledge of General Education information will improve Description: Including pain rating scale, medication(s)/side effects and non-pharmacologic comfort measures Outcome: Progressing   Problem: Activity: Goal: Risk for activity intolerance will decrease Outcome: Progressing

## 2022-01-10 NOTE — Progress Notes (Signed)
Pt reported to RN that he has had multiple recent falls at group home. Pt is experiencing generalized soreness in relation to recent falls. Pt also reported continuation of jerking movements in bilateral upper and lower extremities.   MD notified at this time, will re-evaluate lithium level at this time.

## 2022-01-10 NOTE — Progress Notes (Signed)
Patient arrived via stretcher to 45m07 from ED. Alert and oriented X3. Room air, placed on 3L [place]. Three PIV's C/D/I. Condom cath placed and hooked to drainage bag. Skin assessment done with Earley Abide., RN and no skin issues noted. Sacral foam placed and protocol started. Patient moving all extremities. Stomach distended and no bowel sounds noted. Will report this off to day shift nurse. Patient has personal clothing at bedside. VSS. Afebrile.

## 2022-01-10 NOTE — Care Plan (Signed)
Attempted to get pt. For CT. MD was in Room with pt. And wanted the patient to get some medications first. CT waiting for RN to call when pt. Is ready to come to CT.

## 2022-01-10 NOTE — Progress Notes (Signed)
Pharmacy Antibiotic Note  Mitchell [name] is a 61 y.o. male admitted on 01/09/2022 with sepsis.  Pharmacy has been consulted for cefepime dosing.  Plan: Cefepime 2g IV Q12H.    Temp (24hrs), Avg:99 F (37.2 C), Min:98.3 F (36.8 C), Max:100.1 F (37.8 C)  Recent Labs  Lab 01/06/22 1052 01/06/22 1102 01/07/22 0213 01/08/22 0550 01/09/22 2356  WBC 10.3  --  8.7 7.7 8.5  CREATININE 1.68* 1.60* 0.86 1.19 2.42*  LATICACIDVEN  --   --   --   --  2.8*    Estimated Creatinine Clearance: 33 mL/min (A) (by C-G formula based on SCr of 2.42 mg/dL (H)).    No Known Allergies   Thank you for allowing pharmacy to be a part of this patient's care.  Wynona Neat, PharmD, BCPS  01/10/2022 4:44 AM

## 2022-01-10 NOTE — H&P (Signed)
NAME:  Mitchell [name], MRN:  [id number], DOB:  21-Dec-1960, LOS: 0 ADMISSION DATE:  01/09/2022, CONSULTATION DATE:  01/10/2022  REFERRING MD:  Mesner, EDP, CHIEF COMPLAINT: Hypotension  History of Present Illness:  60 year old brought in by EMS from group home for altered mental status and hypotension. He was hospitalized in close 72/16 for acute encephalopathy in the form of slurred speech, myoclonus with a lithium level of 1.9, EEG negative, required emergent dialysis.  Cause was felt to be increase in lithium dosage when he transferred from one group home. Psychiatry recommended holding lithium for 7 days and decreasing Haldol to 5 mg a.m. and 5 mg p.m. lisinopril was held & discharge creatinine was 1.2 He was brought in by EMS 10/15 , lethargic on arrival, with emesis on his shirt, blood pressure 50s ,given 1 L by EMS, received another liter in the ED, started on Levophed and rapidly titrated to 20 mics.  Temperature was 100.1 rectal , labs showed BUN/creatinine 26/2.4, lactate of 2.8 negative troponin , no leukocytosis hemoglobin 12 , lithium level 1.77, UA was negative, fecal occult blood negative Head CT negative. CT chest/abdomen/pelvis without contrast showed 2.4 cm masslike infiltrate in the left upper lobe   Pertinent  Medical History  Schizophrenia -on lithium, clozapine and Haldol -Per previous record at baseline does not communicate much and mostly responds yes  Hypertension Type 2 diabetes Lives in a group home  Significant Hospital Events: Including procedures, antibiotic start and stop dates in addition to other pertinent events     Interim History / Subjective:  Denies chest pain or shortness of breath  Objective   Blood pressure 104/66, pulse (!) 102, temperature 100.1 F (37.8 C), temperature source Rectal, resp. rate (!) 21, SpO2 96 %.        Intake/Output Summary (Last 24 hours) at 01/10/2022 0408 Last data filed at 01/10/2022 [id number] Gross per 24 hour  Intake  2534.72 ml  Output --  Net 2534.72 ml   There were no vitals filed for this visit.  Examination: General: Obese, well-built male lying in ED stretcher, no distress, flat affect HENT: No JVD, short neck, dry mucosa, no pallor or icterus Lungs: Clear breath sounds bilateral, no accessory muscle use Cardiovascular: S1-S2 distant, no murmur Abdomen: Soft, obese, nontender Extremities: No edema, good pulses Neuro: Follows one-step commands, answers yes or no to questions, oriented to place and time, is lethargic , no meningeal signs  Chest x-ray no infiltrates. VBG -no hypercarbia  EKG shows poor R wave progression, normal sinus rhythm Bedside echo attempted, poor windows  Resolved Hospital Problem list     Assessment & Plan:  Shock NOS -appears hypovolemic on exam, could not obtain good echo windows to estimate LV/RV function at bedside, will await official echo -No risk factors for PE -Has low-grade fever but no leukocytosis, treating as septic shock with empiric antibiotics -We will give additional 2 L of fluid -Hold antihypertensives-amlodipine, clonidine and atenolol, titrate Levophed to MAP 65 -will need CVL if continues to require more than 10 mics after fluids  AKI -trend urine output/BMET -Avoid nephrotoxins  Schizophrenia -hold lithium, plan at dc  was to hold for 7 days Resume clozapine 25/75  and Haldol 5 twice daily   Uncontrolled type 2 diabetes -SSI -Lantus 15 twice daily  Best Practice (right click and "Reselect all SmartList Selections" daily)   Diet/type: Regular consistency (see orders) DVT prophylaxis: prophylactic heparin  GI prophylaxis: N/A Lines: N/A Foley:  N/A Code Status:  full code Last date of multidisciplinary goals of care discussion [NA]  Labs   CBC: Recent Labs  Lab 01/06/22 1052 01/06/22 1102 01/07/22 0213 01/08/22 0550 01/09/22 2356 01/10/22 0301  WBC 10.3  --  8.7 7.7 8.5  --   NEUTROABS 6.2  --   --   --  4.8  --   HGB  14.0 15.0 12.2* 12.9* 12.3* 11.6*  HCT 42.2 44.0 34.2* 37.7* 37.9* 34.0*  MCV 91.7  --  86.8 88.9 93.3  --   PLT 280  --  223 229 299  --     Basic Metabolic Panel: Recent Labs  Lab 01/06/22 1052 01/06/22 1102 01/07/22 0213 01/08/22 0550 01/09/22 2356 01/10/22 0301  NA 137 138 133* 138 136 134*  K 4.6 4.5 3.3* 4.3 5.2* 4.8  CL 105 104 98 105 104  --   CO2 26  --  27 25 22   --   GLUCOSE 179* 175* 194* 231* 287*  --   BUN 13 16 <5* 7 26*  --   CREATININE 1.68* 1.60* 0.86 1.19 2.42*  --   CALCIUM 9.9  --  8.2* 9.3 9.2  --   MG  --   --  1.7  --  2.2  --   PHOS  --   --  2.0* 3.3  --   --    GFR: Estimated Creatinine Clearance: 33 mL/min (A) (by C-G formula based on SCr of 2.42 mg/dL (H)). Recent Labs  Lab 01/06/22 1052 01/07/22 0213 01/08/22 0550 01/09/22 2356  WBC 10.3 8.7 7.7 8.5  LATICACIDVEN  --   --   --  2.8*    Liver Function Tests: Recent Labs  Lab 01/06/22 1052 01/07/22 0213 01/08/22 0550 01/09/22 2356  AST 27 21  --  16  ALT 31 26  --  21  ALKPHOS 170* 158*  --  144*  BILITOT 0.6 0.3  --  0.5  PROT 8.3* 6.9  --  7.6  ALBUMIN 4.1 3.5 3.6 3.7   No results for input(s): "LIPASE", "AMYLASE" in the last 168 hours. Recent Labs  Lab 01/06/22 1612 01/10/22 0255  AMMONIA 36* 23    ABG    Component Value Date/Time   HCO3 17.2 (L) 01/10/2022 0301   TCO2 18 (L) 01/10/2022 0301   ACIDBASEDEF 5.0 (H) 01/10/2022 0301   O2SAT 98 01/10/2022 0301     Coagulation Profile: Recent Labs  Lab 01/06/22 1052 01/09/22 2356  INR 1.0 1.1    Cardiac Enzymes: No results for input(s): "CKTOTAL", "CKMB", "CKMBINDEX", "TROPONINI" in the last 168 hours.  HbA1C: Hgb A1c MFr Bld  Date/Time Value Ref Range Status  01/06/2022 10:52 AM 9.5 (H) 4.8 - 5.6 % Final    Comment:    (NOTE) Pre diabetes:          5.7%-6.4%  Diabetes:              >6.4%  Glycemic control for   <7.0% adults with diabetes   04/24/2021 06:32 PM 9.2 (H) 4.8 - 5.6 % Final    Comment:     (NOTE) Pre diabetes:          5.7%-6.4%  Diabetes:              >6.4%  Glycemic control for   <7.0% adults with diabetes     CBG: Recent Labs  Lab 01/07/22 2059 01/08/22 0756 01/08/22 1132 01/08/22 1603 01/09/22 2348  GLUCAP 154* 249* 316* 160* 243*  Review of Systems:   Generalized weakness No fever  Chest pain, palpitations No dyspnea, leg swelling, oliguria and dysuria No abdominal pain  Past Medical History:  He,  has a past medical history of Bipolar affective disorder ([place]), Coarse tremors (10/02/2014), Diabetes mellitus, Hypertension, Hyponatremia, Lithium toxicity (10/02/2014), Mental disorder, Schizoaffective disorder, Schizoaffective disorder ([place]) (01/04/2019), Seasonal allergies, Sleep apnea, and Stroke ([place]).   Surgical History:   Past Surgical History:  Procedure Laterality Date   CATARACT EXTRACTION W/PHACO Right 02/14/2013   Procedure: CATARACT EXTRACTION PHACO AND INTRAOCULAR LENS PLACEMENT (IOC);  Surgeon: Adonis Brook, MD;  Location: [facility];  Service: Ophthalmology;  Laterality: Right;   CATARACT EXTRACTION W/PHACO Left 06/13/2013   Procedure: CATARACT EXTRACTION PHACO AND INTRAOCULAR LENS PLACEMENT (IOC);  Surgeon: Adonis Brook, MD;  Location: [facility];  Service: Ophthalmology;  Laterality: Left;   CIRCUMCISION  20 yrs. ago   EYE SURGERY       Social History:   reports that he has never smoked. He has never used smokeless tobacco. He reports that he does not currently use alcohol. He reports that he does not use drugs.   Family History:  His family history is not on file.   Allergies No Known Allergies   Home Medications  Prior to Admission medications   Medication Sig Start Date End Date Taking? Authorizing Provider  amLODipine (NORVASC) 10 MG tablet Take 1 tablet (10 mg total) by mouth at bedtime. 05/27/21 01/06/22  Briant Cedar, MD  aspirin EC 81 MG EC tablet Take 1 tablet (81 mg total) by mouth daily. Swallow whole. 05/28/21   Briant Cedar, MD  atenolol (TENORMIN) 25 MG tablet Take 0.5 tablets (12.5 mg total) by mouth daily. 05/28/21 01/06/22  Briant Cedar, MD  cloNIDine (CATAPRES) 0.2 MG tablet Take 0.2 mg by mouth 2 (two) times daily.    [provider]  cloZAPine (CLOZARIL) 25 MG tablet Take 3 tablets (75 mg total) by mouth at bedtime. Patient taking differently: Take 25-75 mg by mouth See admin instructions. Taking 25 mg in the AM and 75 mg at bedtime 05/27/21 01/06/22  Briant Cedar, MD  clozapine (CLOZARIL) 50 MG tablet Take 0.5 tablets (25 mg total) by mouth in the morning. 01/08/22 02/07/22  Rolanda Lundborg, MD  donepezil (ARICEPT) 5 MG tablet Take 1 tablet (5 mg total) by mouth at bedtime. 05/27/21 01/06/22  Briant Cedar, MD  haloperidol (HALDOL) 5 MG tablet Take 1 tablet (5 mg total) by mouth daily. 01/08/22 02/07/22  Rolanda Lundborg, MD  haloperidol (HALDOL) 5 MG tablet Take 1 tablet (5 mg total) by mouth at bedtime. 01/08/22 02/07/22  Rolanda Lundborg, MD  insulin aspart (NOVOLOG) 100 UNIT/ML injection Inject 0-9 Units into the skin 3 (three) times daily with meals. Patient taking differently: Inject 7 Units into the skin 3 (three) times daily with meals. 04/23/21   Lucky Rathke, FNP  insulin glargine (LANTUS) 100 UNIT/ML injection Inject 15 Units into the skin 2 (two) times daily.    [provider]  lithium carbonate (ESKALITH) 450 MG CR tablet Take 1 tablet (450 mg total) by mouth every 12 (twelve) hours. 01/15/22   Rolanda Lundborg, MD  metFORMIN (GLUCOPHAGE) 1000 MG tablet Take 1,000 mg by mouth 2 (two) times daily. 05/11/21   [provider]  senna (SENOKOT) 8.6 MG TABS tablet Take 2 tablets (17.2 mg total) by mouth daily. 05/28/21   Briant Cedar, MD     Critical care time: 50  m       Kara Mead MD. Shade Flood. [facility] Pager : [phone]  If no response to pager , please call [phone] until 7 pm After 7:00 pm call Elink   [phone]   01/10/2022

## 2022-01-10 NOTE — Progress Notes (Signed)
Seen and examined. Appears lethargic, diaphoretic. Abd firm, nontender. Some constipation on CT as well as a nodular LUL infiltrate not visible on CXR. Nonsmoker. Pct neg. Check CK, EEG Not really sure he needs abx but will follow fever curve Keep in ICU for airway watch F/u echo, lactate, am labs  My cc time: 30 mins Erskine Emery MD PCCM

## 2022-01-10 NOTE — ED Triage Notes (Signed)
Patient BIB EMS from [place] for evaluation of altered mental status.  Per report, patient was recently discharge from hospital after admission for lithium toxicity.  Patient lethargic on arrival with emesis on shirt.  Will answer basic questions.  No reports of fever.

## 2022-01-11 DIAGNOSIS — R6521 Severe sepsis with septic shock: Secondary | ICD-10-CM | POA: Diagnosis not present

## 2022-01-11 DIAGNOSIS — I959 Hypotension, unspecified: Secondary | ICD-10-CM | POA: Diagnosis not present

## 2022-01-11 DIAGNOSIS — A419 Sepsis, unspecified organism: Secondary | ICD-10-CM | POA: Diagnosis not present

## 2022-01-11 LAB — GLUCOSE, CAPILLARY
Glucose-Capillary: 267 mg/dL — ABNORMAL HIGH (ref 70–99)
Glucose-Capillary: 141 mg/dL — ABNORMAL HIGH (ref 70–99)
Glucose-Capillary: 182 mg/dL — ABNORMAL HIGH (ref 70–99)
Glucose-Capillary: 193 mg/dL — ABNORMAL HIGH (ref 70–99)
Glucose-Capillary: 219 mg/dL — ABNORMAL HIGH (ref 70–99)
Glucose-Capillary: 177 mg/dL — ABNORMAL HIGH (ref 70–99)

## 2022-01-11 LAB — BASIC METABOLIC PANEL
Anion gap: 12 (ref 5–15)
BUN: 12 mg/dL (ref 6–20)
CO2: 25 mmol/L (ref 22–32)
Calcium: 9.5 mg/dL (ref 8.9–10.3)
Chloride: 103 mmol/L (ref 98–111)
Creatinine, Ser: 1.18 mg/dL (ref 0.61–1.24)
GFR, Estimated: >60 mL/min (ref >60)
Glucose, Bld: 147 mg/dL — ABNORMAL HIGH (ref 70–99)
Potassium: 3.9 mmol/L (ref 3.5–5.1)
Sodium: 140 mmol/L (ref 135–145)

## 2022-01-11 LAB — CBC
HCT: 35.8 % — ABNORMAL LOW (ref 39.0–52.0)
Hemoglobin: 11.7 g/dL — ABNORMAL LOW (ref 13.0–17.0)
MCH: 30.3 pg (ref 26.0–34.0)
MCHC: 32.7 g/dL (ref 30.0–36.0)
MCV: 92.7 fL (ref 80.0–100.0)
Platelets: 238 10*3/uL (ref 150–400)
RBC: 3.86 MIL/uL — ABNORMAL LOW (ref 4.22–5.81)
RDW: 14.2 % (ref 11.5–15.5)
WBC: 10.6 10*3/uL — ABNORMAL HIGH (ref 4.0–10.5)
nRBC: 0 % (ref 0.0–0.2)

## 2022-01-11 LAB — URINE CULTURE: Culture: NO GROWTH

## 2022-01-11 LAB — PHOSPHORUS: Phosphorus: 3.5 mg/dL (ref 2.5–4.6)

## 2022-01-11 LAB — MAGNESIUM: Magnesium: 1.8 mg/dL (ref 1.7–2.4)

## 2022-01-11 LAB — HEPATITIS B SURFACE ANTIGEN

## 2022-01-11 LAB — PROCALCITONIN: Procalcitonin: <0.1 ng/mL

## 2022-01-11 MED ORDER — CLOZAPINE 100 MG PO TABS: 100.0000 mg | ORAL_TABLET | Freq: Every day | ORAL | Status: DC

## 2022-01-11 MED ORDER — GUAIFENESIN 100 MG/5ML PO LIQD: 5.0000 mL | ORAL | Status: DC | PRN

## 2022-01-11 MED ORDER — SODIUM CHLORIDE 0.9 % IV SOLN
2.0000 g | Freq: Three times a day (TID) | INTRAVENOUS | Status: AC
  Administered 2022-01-11 – 2022-01-14 (×11): 2 g via INTRAVENOUS
  Filled 2022-01-11 (×11): qty 12.5

## 2022-01-11 MED ORDER — NAPHAZOLINE-GLYCERIN 0.012-0.25 % OP SOLN
1.0000 [drp] | Freq: Four times a day (QID) | OPHTHALMIC | Status: DC | PRN
  Administered 2022-01-11: 2 [drp] via OPHTHALMIC
  Filled 2022-01-11: qty 15

## 2022-01-11 MED ORDER — LACTATED RINGERS IV SOLN: INTRAVENOUS | Status: AC

## 2022-01-11 MED ORDER — INSULIN ASPART 100 UNIT/ML IJ SOLN
0.0000 [IU] | Freq: Three times a day (TID) | INTRAMUSCULAR | Status: DC
  Administered 2022-01-11: 7 [IU] via SUBCUTANEOUS
  Administered 2022-01-12 (×3): 4 [IU] via SUBCUTANEOUS
  Administered 2022-01-13: 3 [IU] via SUBCUTANEOUS
  Administered 2022-01-13: 7 [IU] via SUBCUTANEOUS
  Administered 2022-01-13: 3 [IU] via SUBCUTANEOUS
  Administered 2022-01-14: 7 [IU] via SUBCUTANEOUS
  Administered 2022-01-14: 3 [IU] via SUBCUTANEOUS
  Administered 2022-01-14 – 2022-01-15 (×2): 7 [IU] via SUBCUTANEOUS
  Administered 2022-01-15: 4 [IU] via SUBCUTANEOUS

## 2022-01-11 MED ORDER — CLOZAPINE 25 MG PO TABS
75.0000 mg | ORAL_TABLET | Freq: Every day | ORAL | Status: DC
  Administered 2022-01-12 – 2022-01-14 (×3): 75 mg via ORAL
  Filled 2022-01-11 (×5): qty 3

## 2022-01-11 MED ORDER — DONEPEZIL HCL 5 MG PO TABS
5.0000 mg | ORAL_TABLET | Freq: Every day | ORAL | Status: DC
  Administered 2022-01-12 – 2022-01-14 (×3): 5 mg via ORAL
  Filled 2022-01-11 (×5): qty 1

## 2022-01-11 MED ORDER — CLOZAPINE 25 MG PO TABS
25.0000 mg | ORAL_TABLET | Freq: Every morning | ORAL | Status: DC
  Administered 2022-01-11 – 2022-01-15 (×5): 25 mg via ORAL
  Filled 2022-01-11 (×6): qty 1

## 2022-01-11 MED ORDER — CLONIDINE HCL 0.2 MG PO TABS
0.2000 mg | ORAL_TABLET | Freq: Two times a day (BID) | ORAL | Status: DC
  Administered 2022-01-11 – 2022-01-15 (×8): 0.2 mg via ORAL
  Filled 2022-01-11 (×9): qty 1

## 2022-01-11 MED ORDER — SENNOSIDES-DOCUSATE SODIUM 8.6-50 MG PO TABS: 1.0000 | ORAL_TABLET | Freq: Two times a day (BID) | ORAL | Status: DC

## 2022-01-11 MED ORDER — NYSTATIN 100000 UNIT/ML MT SUSP
5.0000 mL | Freq: Four times a day (QID) | OROMUCOSAL | Status: DC
  Administered 2022-01-11: 500000 [IU] via ORAL
  Filled 2022-01-11: qty 5

## 2022-01-11 MED ORDER — CLOZAPINE 25 MG PO TABS: 25.0000 mg | ORAL_TABLET | Freq: Every morning | ORAL | Status: DC

## 2022-01-11 MED ORDER — MAGNESIUM SULFATE 2 GM/50ML IV SOLN
2.0000 g | Freq: Once | INTRAVENOUS | Status: AC
  Administered 2022-01-11: 2 g via INTRAVENOUS
  Filled 2022-01-11: qty 50

## 2022-01-11 MED ORDER — SENNOSIDES-DOCUSATE SODIUM 8.6-50 MG PO TABS
1.0000 | ORAL_TABLET | Freq: Two times a day (BID) | ORAL | Status: DC
  Administered 2022-01-11 – 2022-01-15 (×8): 1 via ORAL
  Filled 2022-01-11 (×9): qty 1

## 2022-01-11 MED ORDER — HALOPERIDOL 5 MG PO TABS
5.0000 mg | ORAL_TABLET | Freq: Two times a day (BID) | ORAL | Status: DC
  Administered 2022-01-11 – 2022-01-12 (×3): 5 mg via ORAL
  Filled 2022-01-11 (×5): qty 1

## 2022-01-11 MED ORDER — NYSTATIN 100000 UNIT/ML MT SUSP
5.0000 mL | Freq: Four times a day (QID) | OROMUCOSAL | Status: DC
  Administered 2022-01-11 – 2022-01-15 (×15): 500000 [IU] via ORAL
  Filled 2022-01-11 (×14): qty 5

## 2022-01-11 MED ORDER — GUAIFENESIN 100 MG/5ML PO LIQD
5.0000 mL | ORAL | Status: DC
  Administered 2022-01-11 – 2022-01-15 (×17): 5 mL via ORAL
  Filled 2022-01-11 (×18): qty 5

## 2022-01-11 MED ORDER — INSULIN ASPART 100 UNIT/ML IJ SOLN
0.0000 [IU] | Freq: Every day | INTRAMUSCULAR | Status: DC
  Administered 2022-01-12 – 2022-01-13 (×2): 2 [IU] via SUBCUTANEOUS

## 2022-01-11 NOTE — Progress Notes (Signed)
  Transition of Care [facility]) Screening Note   Patient Details  Name: Mitchell [name] Date of Birth: 28-Oct-1960   Transition of Care [facility]) CM/SW Contact:    Sharin Mons, RN Phone Number: 01/11/2022, 4:27 PM  Readmit from group home, [name] [phone], septic shock. Hx of HTN, DM, schioaffective disorder, CVA . Previous admit: Lithium toxicity. Active with [facility], will need resumption orders @ d/c with transition to back to group home. Pt currently in ICU, awake,lethargic. NCM attempted to speak with pt @ bedside regarding d/c planning, however, pt wouldn't respond to questions asked.  Transition of Care Department [facility]) will continue to monitor and assist withTOC needs.   Whitman Hero RN,BSN,CM [phone]

## 2022-01-11 NOTE — Progress Notes (Signed)
PHARMACY NOTE:  ANTIMICROBIAL RENAL DOSAGE ADJUSTMENT  Current antimicrobial regimen includes a mismatch between antimicrobial dosage and estimated renal function.  As per policy approved by the Pharmacy & Therapeutics and Medical Executive Committees, the antimicrobial dosage will be adjusted accordingly.  Current antimicrobial dosage:  cefepime 2g q12h  Indication: PNA  Renal Function:  Estimated Creatinine Clearance: 70.5 mL/min (by C-G formula based on SCr of 1.18 mg/dL).  Antimicrobial dosage has been changed to:  cefepime 2g q8h   Additional comments:   Thank you for allowing pharmacy to be a part of this patient's care.  Cristela Felt, PharmD, BCPS Clinical Pharmacist 01/11/2022 9:42 AM

## 2022-01-11 NOTE — Inpatient Diabetes Management (Signed)
Inpatient Diabetes Program Recommendations  AACE/ADA: New Consensus Statement on Inpatient Glycemic Control (2015)  Target Ranges:  Prepandial:   less than 140 mg/dL      Peak postprandial:   less than 180 mg/dL (1-2 hours)      Critically ill patients:  140 - 180 mg/dL   Lab Results  Component Value Date   GLUCAP 193 (H) 01/11/2022   HGBA1C 9.5 (H) 01/06/2022    Review of Glycemic Control  Latest Reference Range & Units 01/10/22 19:07 01/10/22 23:12 01/11/22 03:21 01/11/22 07:30 01/11/22 11:24  Glucose-Capillary 70 - 99 mg/dL 192 (H) 174 (H) 141 (H) 182 (H) 193 (H)  (H): Data is abnormally high Diabetes history: Type 2 DM Outpatient Diabetes medications: Lantus 15 units BID, Novolog 7 units TID, Metformin 1000 mg BID Current orders for Inpatient glycemic control: Novolog 0-15 units Q4H  Inpatient Diabetes Program Recommendations:    Consider adding Levemir 10 units BID.   Thanks, Bronson Curb, MSN, RNC-OB Diabetes Coordinator [phone] (8a-5p)

## 2022-01-11 NOTE — Progress Notes (Signed)
Dr. Humphrey Rolls notified that pt only voided 100 ml during the shift so far. Bladder scan volume <300. No further orders received.

## 2022-01-11 NOTE — Progress Notes (Signed)
eLink Physician-Brief Progress Note Patient Name: Mitchell [name] DOB: 23-Mar-1961 MRN: [id number]   Date of Service  01/11/2022  HPI/Events of Note  Notified by RN of yeasty tongue, red eyes and intermittent cough.   RN reports that patient is oriented x 3.  He wakes up to name calling and is appropriate.    On camera assessment, pt is sleeping and not in respiratory distress.  BP 150/86, HR 101, RR 16, O2 sats 93%.   eICU Interventions  Nystatin ordered.  Eye drops ordered PRN.  Guaifenesin ordered PRN.      Intervention Category Intermediate Interventions: Other:  Elsie Lincoln 01/11/2022, 12:10 AM

## 2022-01-11 NOTE — Progress Notes (Signed)
NAME:  Mitchell [name], MRN:  [id number], DOB:  18-Jan-1961, LOS: 1 ADMISSION DATE:  01/09/2022, CONSULTATION DATE:  01/10/22 REFERRING MD:  EDP CHIEF COMPLAINT:  Hypotension   History of Present Illness:  61 year old brought in by EMS from group home for altered mental status and hypotension. He was hospitalized in close 72/16 for acute encephalopathy in the form of slurred speech, myoclonus with a lithium level of 1.9, EEG negative, required emergent dialysis.  Cause was felt to be increase in lithium dosage when he transferred from one group home. Psychiatry recommended holding lithium for 7 days and decreasing Haldol to 5 mg a.m. and 5 mg p.m. lisinopril was held & discharge creatinine was 1.2 He was brought in by EMS 10/15 , lethargic on arrival, with emesis on his shirt, blood pressure 50s ,given 1 L by EMS, received another liter in the ED, started on Levophed and rapidly titrated to 20 mics.  Temperature was 100.1 rectal , labs showed BUN/creatinine 26/2.4, lactate of 2.8 negative troponin , no leukocytosis hemoglobin 12 , lithium level 1.77, UA was negative, fecal occult blood negative Head CT negative. Pt s/p 3 L LR.  CT chest/abdomen/pelvis without contrast showed 2.4 cm masslike infiltrate in the left upper lobe Pertinent  Medical History  Schizoprenia, HTN, T2DM Significant Hospital Events: Including procedures, antibiotic start and stop dates in addition to other pertinent events    Interim History / Subjective:  States having some chest pain this am. Improved with coughing.  Review of Systems:   Negative unless stated in the subjective.  Objective: Temp slightly elevated at 99.7 but latest one at 98.9. BP with MAP>75 without pressor support. Satting well with [place].   Blood pressure 138/81, pulse 99, temperature 99.7 F (37.6 C), temperature source Oral, resp. rate 13, weight 98.4 kg, SpO2 98 %.        Intake/Output Summary (Last 24 hours) at 01/11/2022 0755 Last data filed at  01/11/2022 0304 Gross per 24 hour  Intake 1610.47 ml  Output 4700 ml  Net -3089.53 ml   Filed Weights   01/10/22 0500 01/11/22 0310  Weight: 102.6 kg 98.4 kg   Examination: General: NAD HENT: NCAT, Dry MMM Lungs: CTAB Cardiovascular: sinus tachycardia Abdomen: normal bowel sounds, No TTP.  Extremities: No asymmetry Neuro: alert. Slow to respond. Oriented x2.  GU: foley present [facility] Problem list   AKI Assessment & Plan:  Combined Shock 2/2 to hypovolemic and septic shock Resolved. Was hypovolemic on initial exam and febrile. On Cefepime 2 g. Improvement in volume status with IVF. Plan to encourage oral hydration. Continue abx. Resp culture pending. Repeat procal. Plan to transfer out of the ICU given no ICU needs.  -Continue abx -Follow up above labs.  -LR at 100 cc/hr until pt is having adequate oral intake.   Lung Infiltrate Found on CT of chest. Treat pneumonia as above. Follow up with repeat imaging after PNA treatment as suspicion present for malignancy. -Repeat CT in 3-4 months.   HTN Chronic. Meds held due to hypotension. Plan to start home meds slowly.   Schizoprenia Previously on lithium, haldol and clozapine. Per previous discharge note pt was to stop lithium for 7 days and continue Clozapine at 25 mg am and 75 mg PM and Haldol 5 mg BID as with the discussion with behavioral health provider.  -Continue to hold lithium, continue Clozapine and Haldol.   T2DM At home on Lantus 15 BID. Here on SSI. CBG mostly <180  with last one at 182. Will increase sensitivity if noon glucose is high.   Oral Candidiasis On oral nystatin q4hrs. Plan to continue nystatin for 7 days or 48 hours after thrush resolves.   AKI Resolved with IVF and pressor support.   Best Practice (right click and "Reselect all SmartList Selections" daily)  Diet/type: Regular consistency (see orders) DVT prophylaxis: prophylactic heparin  GI prophylaxis: N/A Lines:  N/A Foley:  N/A Continuous: None  Code Status:  full code Last date of multidisciplinary goals of care discussion [Plan for today] Labs: CBC with WBC at 10.6, Hgb at 11.7, Plt at 238. BMP shows normal renal fxn. Mag at 1.8, and phosphorus at 3.5. LA initially elevated but resolved. Lithium at 0.59  CBC: Recent Labs  Lab 01/06/22 1052 01/06/22 1102 01/07/22 0213 01/08/22 0550 01/09/22 2356 01/10/22 0301 01/10/22 0409 01/10/22 0906 01/11/22 0354  WBC 10.3  --  8.7 7.7 8.5  --  9.9  --  10.6*  NEUTROABS 6.2  --   --   --  4.8  --   --   --   --   HGB 14.0   < > 12.2* 12.9* 12.3* 11.6* 11.7* 10.5* 11.7*  HCT 42.2   < > 34.2* 37.7* 37.9* 34.0* 36.5* 31.0* 35.8*  MCV 91.7  --  86.8 88.9 93.3  --  94.6  --  92.7  PLT 280  --  223 229 299  --  254  --  238   < > = values in this interval not displayed.   Basic Metabolic Panel: Recent Labs  Lab 01/07/22 0213 01/08/22 0550 01/09/22 2356 01/10/22 0301 01/10/22 0409 01/10/22 0906 01/10/22 1426 01/11/22 0354  NA 133* 138 136 134* 135 139 138 140  K 3.3* 4.3 5.2* 4.8 5.3* 4.0 4.6 3.9  CL 98 105 104  --  106  --  107 103  CO2 27 25 22   --  17*  --  22 25  GLUCOSE 194* 231* 287*  --  319*  --  161* 147*  BUN <5* 7 26*  --  25*  --  19 12  CREATININE 0.86 1.19 2.42*  --  2.00*  --  1.48* 1.18  CALCIUM 8.2* 9.3 9.2  --  8.7*  --  9.1 9.5  MG 1.7  --  2.2  --   --   --   --  1.8  PHOS 2.0* 3.3  --   --   --   --   --  3.5   GFR: Estimated Creatinine Clearance: 70.5 mL/min (by C-G formula based on SCr of 1.18 mg/dL). Recent Labs  Lab 01/08/22 0550 01/09/22 2356 01/10/22 0409 01/10/22 0410 01/10/22 0809 01/10/22 1700 01/11/22 0354  PROCALCITON  --   --  <0.10  --   --   --   --   WBC 7.7 8.5 9.9  --   --   --  10.6*  LATICACIDVEN  --  2.8*  --  2.9* 4.0* 1.6  --    Liver Function Tests: Recent Labs  Lab 01/06/22 1052 01/07/22 0213 01/08/22 0550 01/09/22 2356 01/10/22 0409 01/10/22 1426  AST 27 21  --  16 15 21   ALT  31 26  --  21 17 17   ALKPHOS 170* 158*  --  144* 125 124  BILITOT 0.6 0.3  --  0.5 0.8 0.9  PROT 8.3* 6.9  --  7.6 6.6 7.0  ALBUMIN 4.1 3.5 3.6 3.7 3.3* 3.4*  No results for input(s): "LIPASE", "AMYLASE" in the last 168 hours. Recent Labs  Lab 01/06/22 1612 01/10/22 0255  AMMONIA 36* 23   ABG    Component Value Date/Time   PHART 7.347 (L) 01/10/2022 0906   PCO2ART 40.3 01/10/2022 0906   PO2ART 67 (L) 01/10/2022 0906   HCO3 22.1 01/10/2022 0906   TCO2 23 01/10/2022 0906   ACIDBASEDEF 3.0 (H) 01/10/2022 0906   O2SAT 92 01/10/2022 0906    Coagulation Profile: Recent Labs  Lab 01/06/22 1052 01/09/22 2356  INR 1.0 1.1   Cardiac Enzymes: Recent Labs  Lab 01/10/22 0809  CKTOTAL 343   HbA1C: Hgb A1c MFr Bld  Date/Time Value Ref Range Status  01/06/2022 10:52 AM 9.5 (H) 4.8 - 5.6 % Final    Comment:    (NOTE) Pre diabetes:          5.7%-6.4%  Diabetes:              >6.4%  Glycemic control for   <7.0% adults with diabetes   04/24/2021 06:32 PM 9.2 (H) 4.8 - 5.6 % Final    Comment:    (NOTE) Pre diabetes:          5.7%-6.4%  Diabetes:              >6.4%  Glycemic control for   <7.0% adults with diabetes    CBG: Recent Labs  Lab 01/10/22 1604 01/10/22 1907 01/10/22 2312 01/11/22 0321 01/11/22 0730  GLUCAP 173* 192* 174* 141* 182*   Past Medical History:  He,  has a past medical history of Bipolar affective disorder ([place]), Coarse tremors (10/02/2014), Diabetes mellitus, Hypertension, Hyponatremia, Lithium toxicity (10/02/2014), Mental disorder, Schizoaffective disorder, Schizoaffective disorder ([place]) (01/04/2019), Seasonal allergies, Sleep apnea, and Stroke ([place]).  Surgical History:   Past Surgical History:  Procedure Laterality Date   CATARACT EXTRACTION W/PHACO Right 02/14/2013   Procedure: CATARACT EXTRACTION PHACO AND INTRAOCULAR LENS PLACEMENT (IOC);  Surgeon: Adonis Brook, MD;  Location: [place];  Service: Ophthalmology;  Laterality: Right;   CATARACT  EXTRACTION W/PHACO Left 06/13/2013   Procedure: CATARACT EXTRACTION PHACO AND INTRAOCULAR LENS PLACEMENT (IOC);  Surgeon: Adonis Brook, MD;  Location: [place];  Service: Ophthalmology;  Laterality: Left;   CIRCUMCISION  20 yrs. ago   EYE SURGERY      Social History:   reports that he has never smoked. He has never used smokeless tobacco. He reports that he does not currently use alcohol. He reports that he does not use drugs.  Family History:  His family history is not on file.  Allergies No Known Allergies  Home Medications  Prior to Admission medications   Medication Sig Start Date End Date Taking? Authorizing Provider  amLODipine (NORVASC) 10 MG tablet Take 1 tablet (10 mg total) by mouth at bedtime. 05/27/21 01/10/22 Yes Pashayan, Redgie Grayer, MD  aspirin EC 81 MG EC tablet Take 1 tablet (81 mg total) by mouth daily. Swallow whole. 05/28/21  Yes Pashayan, Redgie Grayer, MD  atenolol (TENORMIN) 25 MG tablet Take 0.5 tablets (12.5 mg total) by mouth daily. 05/28/21 01/10/22 Yes Pashayan, Redgie Grayer, MD  cloNIDine (CATAPRES) 0.2 MG tablet Take 0.2 mg by mouth 2 (two) times daily.   Yes [provider]  clozapine (CLOZARIL) 50 MG tablet Take 0.5 tablets (25 mg total) by mouth in the morning. 01/08/22 02/07/22 Yes Rolanda Lundborg, MD  donepezil (ARICEPT) 5 MG tablet Take 1 tablet (5 mg total) by mouth at bedtime. 05/27/21 01/10/22 Yes Pashayan,  Redgie Grayer, MD  haloperidol (HALDOL) 5 MG tablet Take 1 tablet (5 mg total) by mouth at bedtime. Patient taking differently: Take 5 mg by mouth 2 (two) times daily. 01/08/22 02/07/22 Yes Rolanda Lundborg, MD  insulin aspart (NOVOLOG) 100 UNIT/ML injection Inject 0-9 Units into the skin 3 (three) times daily with meals. Patient taking differently: Inject 7 Units into the skin 3 (three) times daily with meals. 04/23/21  Yes Lucky Rathke, FNP  insulin glargine (LANTUS) 100 UNIT/ML injection Inject 15 Units into the skin 2 (two) times daily.   Yes [provider]  metFORMIN (GLUCOPHAGE) 1000 MG tablet Take 1,000 mg by mouth 2 (two) times daily. 05/11/21  Yes [provider]  senna (SENOKOT) 8.6 MG TABS tablet Take 2 tablets (17.2 mg total) by mouth daily. 05/28/21  Yes Pashayan, Redgie Grayer, MD  cloZAPine (CLOZARIL) 25 MG tablet Take 3 tablets (75 mg total) by mouth at bedtime. Patient taking differently: Take 25-75 mg by mouth See admin instructions. Taking 25 mg in the AM and 75 mg at bedtime 05/27/21 01/06/22  Briant Cedar, MD  lithium carbonate (ESKALITH) 450 MG CR tablet Take 1 tablet (450 mg total) by mouth every 12 (twelve) hours. Patient not taking: Reported on 01/10/2022 01/15/22   Rolanda Lundborg, MD    Idamae Schuller, MD Tillie Rung. [facility] Internal Medicine Residency, PGY-2

## 2022-01-11 NOTE — Progress Notes (Signed)
PHARMACIST - PHYSICIAN ORDER COMMUNICATION  VINAY ERTL is a 61 y.o. year old male with a history of schizophrenia on Clozapine PTA. Continuing this medication order as an inpatient requires that monitoring parameters per REMS requirements must be met.   Clozapine REMS Dispense Authorization was obtained, and will dispense inpatient.  RDA code [id number].  Verified Clozapine dose: 47m qam and 74mqpm  Last ANC value and date reported on the Clozapine REMS website: 11/20/21; added AND from 01/09/22 ANHarperonitoring frequency: monthly Next ANC reporting is due on (date) 02/09/2022.  GrCristela FeltPharmD, BCPS Clinical Pharmacist 01/11/2022 10:48 AM

## 2022-01-12 DIAGNOSIS — G9340 Encephalopathy, unspecified: Secondary | ICD-10-CM

## 2022-01-12 DIAGNOSIS — J189 Pneumonia, unspecified organism: Secondary | ICD-10-CM

## 2022-01-12 LAB — BASIC METABOLIC PANEL
Anion gap: 7 (ref 5–15)
BUN: 17 mg/dL (ref 6–20)
CO2: 26 mmol/L (ref 22–32)
Calcium: 8.6 mg/dL — ABNORMAL LOW (ref 8.9–10.3)
Chloride: 107 mmol/L (ref 98–111)
Creatinine, Ser: 1.3 mg/dL — ABNORMAL HIGH (ref 0.61–1.24)
GFR, Estimated: >60 mL/min (ref >60)
Glucose, Bld: 136 mg/dL — ABNORMAL HIGH (ref 70–99)
Potassium: 4 mmol/L (ref 3.5–5.1)
Sodium: 140 mmol/L (ref 135–145)

## 2022-01-12 LAB — RESPIRATORY PANEL BY PCR

## 2022-01-12 LAB — CBC
HCT: 31.2 % — ABNORMAL LOW (ref 39.0–52.0)
Hemoglobin: 10.4 g/dL — ABNORMAL LOW (ref 13.0–17.0)
MCH: 30.9 pg (ref 26.0–34.0)
MCHC: 33.3 g/dL (ref 30.0–36.0)
MCV: 92.6 fL (ref 80.0–100.0)
Platelets: 213 10*3/uL (ref 150–400)
RBC: 3.37 MIL/uL — ABNORMAL LOW (ref 4.22–5.81)
RDW: 13.9 % (ref 11.5–15.5)
WBC: 6.9 10*3/uL (ref 4.0–10.5)
nRBC: 0 % (ref 0.0–0.2)

## 2022-01-12 LAB — GLUCOSE, CAPILLARY
Glucose-Capillary: 163 mg/dL — ABNORMAL HIGH (ref 70–99)
Glucose-Capillary: 189 mg/dL — ABNORMAL HIGH (ref 70–99)
Glucose-Capillary: 163 mg/dL — ABNORMAL HIGH (ref 70–99)
Glucose-Capillary: 213 mg/dL — ABNORMAL HIGH (ref 70–99)

## 2022-01-12 LAB — PHOSPHORUS: Phosphorus: 4.4 mg/dL (ref 2.5–4.6)

## 2022-01-12 LAB — PROCALCITONIN: Procalcitonin: <0.1 ng/mL

## 2022-01-12 LAB — MAGNESIUM: Magnesium: 2.3 mg/dL (ref 1.7–2.4)

## 2022-01-12 MED ORDER — INSULIN GLARGINE-YFGN 100 UNIT/ML ~~LOC~~ SOLN
15.0000 [IU] | Freq: Every day | SUBCUTANEOUS | Status: DC
  Administered 2022-01-12 – 2022-01-14 (×3): 15 [IU] via SUBCUTANEOUS
  Filled 2022-01-12 (×4): qty 0.15

## 2022-01-12 MED ORDER — POLYETHYLENE GLYCOL 3350 17 G PO PACK
17.0000 g | PACK | Freq: Every day | ORAL | Status: DC
  Administered 2022-01-12 – 2022-01-15 (×4): 17 g via ORAL
  Filled 2022-01-12 (×4): qty 1

## 2022-01-12 NOTE — Progress Notes (Signed)
PT Cancellation Note  Patient Details Name: Mitchell [name] MRN: [id number] DOB: [birth year]   Cancelled Treatment:     Attempted to see pt for PT evaluation, however pt lethargic and only able to keep eyes open for a few seconds before falling back asleep. Will attempt again as pt becomes more aware/alert.   Gore, DPT  01/12/2022, 12:18 PM

## 2022-01-12 NOTE — Plan of Care (Signed)

## 2022-01-12 NOTE — Progress Notes (Addendum)
ICU Course: Patient was initially admitted on 10/11 with AMS and myoclonus thought to be secondary to elevated lithium level, requiring emergent HD and was subsequently discharged with outpatient psychiatry follow up. He was readmitted on 10/15 with significant hypotension (SBP 50s), lethargy, and emesis and found to have a rectal temp of 100.14F. He was ultimately diagnosed with mixed hypovolemic/septic shock in the setting of pneumonia. He initially required IVF, vasopressor support with levophed, and was started on broad spectrum antibiotics. He has had significant improvement in his BP since that time and was taken off of vasopressor support. Antibiotics were narrowed to cefepime alone for treatment of pneumonia (total 5-day course). Given improvement in mental status and BP, he no longer requires ICU level of care and IMTS was asked to admit to our service.  Subjective:   No acute overnight events noted. Evaluated patient at bedside.  He is somewhat drowsy during encounter, but responsive to verbal stimuli and able to maintain a small conversation. He reports feeling better today. Still does not feel completely right, but unable to state what feels wrong. He states that he has not had a BM in a few days and feels bloated. He has been drinking some water today but not having much urine output. Otherwise, no other complaints/concerns at this time.   Objective:  Vital signs in last 24 hours: Vitals:   01/11/22 1730 01/11/22 2100 01/12/22 0300 01/12/22 0737  BP: 118/60 102/64 (!) 101/59 (!) 142/75  Pulse: 78 81 69   Resp: 14 13 12    Temp: 98.6 F (37 C) 98 F (36.7 C) 98 F (36.7 C) 98 F (36.7 C)  TempSrc: Oral Oral Oral Oral  SpO2: 100% 98% 98%   Weight:       General: pleasant obese gentleman, drowsy during interaction, laying in bed, NAD. Mouth: moist and clear, no exudates or whitish discoloration noted on exam. CV: normal rate and regular rhythm, no m/r/g. No JVD noted. 2+ radial  and DP pulses bilaterally. Pulm: coarse lung sounds on R, possibly transmitted upper airway sounds. Otherwise, clear to auscultation. Saturating >94% on 2L [place]. No respiratory distress or accessory muscle usage noted. Abdomen: soft, mildly distended, nontender to palpation. Normoactive bowel sounds. MSK: no peripheral edema noted. Skin: warm and dry. Neuro: AAOx2 (oriented to person and place, but not to time). Drowsy during interaction, but able to maintain conversation.   Assessment/Plan:  Principal Problem:   Septic shock ([place]) Active Problems:   Hypotension  Combined hypovolemic/septic shock - Resolved Hospital Acquired Pneumonia Patient's shock has resolved and MAPs are persisting >80 since this morning. He is on day 3 of cefepime for treatment of HAP. Given that his procalcitonin has been <0.10 for the past 2 days, will check a respiratory viral panel to ascertain if pneumonia could be viral in nature and if antibiotics can be discontinued. Overall, he does appear to be improving clinically. -continue IVF resuscitation, transition to oral fluids once able -continue cefepime (day 3/5) -f/u respiratory viral panel, expectorated sputum culture with gram stain -flutter valve -supplemental O2 as needed to maintain O2 sats >92%, wean O2 to room air as tolerated -PT/OT eval  AKI - resolved Baseline Cr ~1.2-1.3, on admission Cr 2.4. Likely secondary to shock. Kidney function has improved and is now at baseline. It does appear that he needs further IVF resuscitation at this time as he is not having much UOP (100cc recorded over past 24 hours). Will transition to oral fluids alone once UOP improves  to ensure adequate hydration has been achieved. -continue to trend kidney function while recovering from shock -IVF resuscitation, plan to transition to oral fluids alone once able -avoid nephrotoxic medications -strict I/O's, monitor UOP  Constipation Reports not having a bowel movement in the  past few days and feels bloated. No documented BMs on chart review. -senna-s BID -miralax daily  Pulmonary infiltrate Patient with nodular opacities noted on CT chest imaging. Per PCCM, will need repeat CT imaging in 3-4 months to rule out malignancy after pneumonia has been adequately treated. -repeat CT chest in 3-4 months  Chronic HTN On norvasc 10mg  daily, atenolol 12.5mg  daily, and clonidine 0.2mg  BID at home. Antihypertensives were held on admission given presentation of combined shock. Clonidine was restarted yesterday per PCCM as BP has improved with resolution of combined shock. BP was borderline yesterday with administration of clonidine (90s-100s) but appears to have improved today (now 130s-140s). Should BP decrease again, would consider changing antihypertensive therapy from clonidine to either norvasc or atenolol given variable effects of clonidine.  -continue clonidine -holding norvasc and atenolol -if precipitous drop in BP with clonidine, change to atenolol or norvasc  T2DM with hyperglycemia On lantus 15u BID, novolog 7u TID with meals, and metformin 1000mg  BID at home. He is doing fairly well on resistant SSI (CBGs 130s-190s) alone while here, but will restart home lantus at half dose (15u qhs) today. -continue resistant SSI -lantus 15u qhs starting tonight -trend CBGs with goal 140-180  Oral candidiasis Noted on exam yesterday and was started on nystatin suspension QID for 7 day course. Appears to be improving as no exudates or whitish discoloration noted in mouth on exam today. -continue nystatin QID (day 2/7)  Schizophrenia Follows psychiatry in the outpatient setting. -continue haldol 5mg  BID -continue donepezil 5mg  qhs -continue clozapine 25mg  every morning and 75mg  qhs  Prior to Admission Living Arrangement: Home Anticipated Discharge Location: TBD Barriers to Discharge: continued medical management Dispo: Anticipated discharge in approximately 2-3 day(s).    Virl Axe, MD 01/12/2022, 9:15 AM Pager: [phone] After 5pm on weekdays and 1pm on weekends: On Call pager [phone]

## 2022-01-12 NOTE — TOC Initial Note (Signed)
Transition of Care [facility]) - Initial/Assessment Note    Patient Details  Name: Mitchell [name] MRN: [id number] Date of Birth: [birth year]  Transition of Care [facility]) CM/SW Contact:    Joanne Chars, LCSW Phone Number: 01/12/2022, 11:53 AM  Clinical Narrative:    CSW attempted to speak with pt in room, pt sleepy, initially answered a few questions but then fell asleep, did not respond.  CSW spoke with Donny Pique, [place] home.  She confirmed that pt is current resident there at the [facility] location: [address] [place], [place].  Pt does not have legal guardian.  Pt has been in several of her group homes since March 2023, she has not had any contact with pt son or daughter during that time.  Pt was set up with [facility] at discharge several days ago, but his has not yet been started due to pt being readmitted.  Pt is seen by psychiatric provider who comes to the group home.               Expected Discharge Plan: Group Home Barriers to Discharge: Continued Medical Work up   Patient Goals and CMS Choice        Expected Discharge Plan and Services Expected Discharge Plan: Group Home In-house Referral: Clinical Social Work   Post Acute Care Choice:  (TBD) Living arrangements for the past 2 months: Group Home                                      Prior Living Arrangements/Services Living arrangements for the past 2 months: [place] Lives with:: Facility Resident Patient language and need for interpreter reviewed:: Yes        Need for Family Participation in Patient Care: Yes (Comment) Care giver support system in place?: No (comment) Current home services: Home PT, Home OT Criminal Activity/Legal Involvement Pertinent to Current Situation/Hospitalization: No - Comment as needed  Activities of Daily Living      Permission Sought/Granted                  Emotional Assessment Appearance:: Appears stated age Attitude/Demeanor/Rapport: Unable to Assess Affect  (typically observed): Unable to Assess Orientation: : Oriented to Self, Oriented to Place, Oriented to Situation Alcohol / Substance Use: Not Applicable Psych Involvement: Outpatient Provider  Admission diagnosis:  Septic shock ([place]) [A41.9, R65.21] Hypotension, unspecified hypotension type [I95.9] Patient Active Problem List   Diagnosis Date Noted   Septic shock ([place]) 01/10/2022   Lithium toxicity, accidental or unintentional, initial encounter 01/06/2022   Encephalopathy acute    Acute kidney injury ([place])    Schizophrenia ([place]) 04/29/2021   Lithium toxicity 10/02/2014   Hypertension    Type 2 diabetes mellitus ([place])    Sleep apnea    Hyponatremia 03/13/2011   Hypotension 05/12/2010   PCP:  Pcp, No Pharmacy:   [id number] - , [address] [place] [address] Phone: [phone] Fax: [phone]  [facility] [address] [address] Phone: [phone] Fax: [place], [place] - [address] [place] [address] Phone: [phone] Fax: [phone]     Social Determinants of Health (SDOH) Interventions    Readmission Risk Interventions     No data to display

## 2022-01-12 NOTE — Hospital Course (Addendum)
Mitchell [name] is a 61 year old male with a past psychiatric hx of schizophrenia on lithium, clozapine and haldol and past medical hx of HTN, T2DM who was admitted on 10/15 with significant hypotension (SBP 50s), lethargy, and emesis and found to have a rectal temp of 100.81F, LA 2.9, mild leukocytosis 10.6. He was ultimately diagnosed with mixed hypovolemic/septic shock in the setting of pneumonia. Of note, he was recently admitted 10/11 with AMS and myoclonus thought to be secondary to elevated lithium level, requiring emergent HD.  Combined hypovolemic/septic shock Hospital Acquired Pneumonia BP 50s on admission with rectal T100.1, LA 2.8 . Unremarkable CK, troponin, cortisol. CT head with no acute intracranial process. CXR no active disease. Noted to have LUL mass on CT. Procalcitonin < 0.1 x3. He required ICU admission and was started on levophed. He was also started on cefepime and completed 5 day course for treatment of HAP (10/15-10/19) as he clinically improved. Less suspicious for viral PNA given unremarkable respiratory viral panel. Bcx, UCx with no growth. Echo with LVEF 65%. Another contributing factor could have been his clozapine at prior discharge. He was able to maintain his blood pressures without levophed on his home clozapine regimen and was transferred out of the ICU to the medical floor. He was afebrile without leukocytosis and maintained his blood pressures on the floor for 48 hours prior to discharge.   Encephalopathy  He was initially intermittently somnolent when he was transferred to the floor. Likely hypoactive delirium given his hospitalizations in which case symptomatic treatment would not be used. His haldol was decreased with improvement in his mental status. Of note, on prior admission there was extensive workup for acute encephalopathy including EEG, MRI, TSH which were all unremarkable. He has cognitive impairment with low MOCA and MMSE scores noted in [facility] admission 05/2021 at  baseline. Per his group home manager, he needs help completing iADLS and managing his medications. He was alert, oriented to self, place, and city at time of discharge.   AKI  Baseline Cr ~1.2-1.3, on admission Cr 2.4. Likely secondary to shock. He required further IVF resuscitation at this time as he is not having much UOP (100cc recorded over past 24 hours). He was transitioned to oral fluids and regular diet. Kidney function improved was at baseline Cr 1 at time of discharge.   Pulmonary infiltrate Patient with LUL 2.4cm rounded masslike area noted on CT chest imaging. Pulmonary and critical care physicians noted will need repeat CT imaging in 3-4 months to rule out malignancy after pneumonia has been adequately treated. Repeat CT chest in 3-4 months outpatient.   Chronic HTN He has chronic HTN but his home BP meds were held on admission due to his hypotension. His clonidine was restarted.  Norvasc and atenolol continued to be held at time of discharge.   T2DM with hyperglycemia Home meds are lantus 15u BID, novolog 7u TID with meals, and metformin 1000mg  BID at home. Restarted home lantus at half dose (15u qhs) with fasting glucose 140-180s during admission. He will need monitoring of his glucose and diabetes regimen at time of discharge.   Normocytic anemia  Hgb downtrending since last admission. Could be dilutional in the setting of receiving IVF. No signs or symptoms of bleeding. Would consider further work-up outpatient. Obtained CBC with diff and reticulocyte count which showed ANC within normal limits.    Oral candidiasis He was started on nystatin suspension QID for 7 day course (10/16-10/22).   Schizophrenia Follows psychiatry in the  outpatient setting (seen by Leighton Ruff). He was continued on bowel regimen given he is on clozapine and had a bowel movement prior to dsicharge. Haldol was decreased in the setting of lethargy and encephalopathy. He was continued on donepezil and  clozapine. Lithium was held given toxicity during last admission and at discharge. He is discharged on haldol 5 QHS, clozapine 25mg  AM, 75mg  PM. He denied suicidal ideation, auditory and visual hallucinations at time of discharge. He has close psychiatric follow-up with , appointment is scheduled for 10/23 at 3pm.

## 2022-01-13 ENCOUNTER — Other Ambulatory Visit: Payer: Self-pay

## 2022-01-13 DIAGNOSIS — G9340 Encephalopathy, unspecified: Secondary | ICD-10-CM | POA: Diagnosis not present

## 2022-01-13 DIAGNOSIS — R918 Other nonspecific abnormal finding of lung field: Secondary | ICD-10-CM

## 2022-01-13 DIAGNOSIS — J189 Pneumonia, unspecified organism: Secondary | ICD-10-CM | POA: Diagnosis not present

## 2022-01-13 DIAGNOSIS — B370 Candidal stomatitis: Secondary | ICD-10-CM

## 2022-01-13 LAB — CBC
HCT: 29.5 % — ABNORMAL LOW (ref 39.0–52.0)
Hemoglobin: 10 g/dL — ABNORMAL LOW (ref 13.0–17.0)
MCH: 30.9 pg (ref 26.0–34.0)
MCHC: 33.9 g/dL (ref 30.0–36.0)
MCV: 91 fL (ref 80.0–100.0)
Platelets: 205 10*3/uL (ref 150–400)
RBC: 3.24 MIL/uL — ABNORMAL LOW (ref 4.22–5.81)
RDW: 13.3 % (ref 11.5–15.5)
WBC: 5.5 10*3/uL (ref 4.0–10.5)
nRBC: 0 % (ref 0.0–0.2)

## 2022-01-13 LAB — BASIC METABOLIC PANEL
Anion gap: 9 (ref 5–15)
BUN: 13 mg/dL (ref 6–20)
CO2: 26 mmol/L (ref 22–32)
Calcium: 9.1 mg/dL (ref 8.9–10.3)
Chloride: 105 mmol/L (ref 98–111)
Creatinine, Ser: 1.05 mg/dL (ref 0.61–1.24)
GFR, Estimated: >60 mL/min (ref >60)
Glucose, Bld: 115 mg/dL — ABNORMAL HIGH (ref 70–99)
Potassium: 3.6 mmol/L (ref 3.5–5.1)
Sodium: 140 mmol/L (ref 135–145)

## 2022-01-13 LAB — GLUCOSE, CAPILLARY
Glucose-Capillary: 124 mg/dL — ABNORMAL HIGH (ref 70–99)
Glucose-Capillary: 149 mg/dL — ABNORMAL HIGH (ref 70–99)
Glucose-Capillary: 202 mg/dL — ABNORMAL HIGH (ref 70–99)
Glucose-Capillary: 202 mg/dL — ABNORMAL HIGH (ref 70–99)

## 2022-01-13 LAB — MAGNESIUM: Magnesium: 2 mg/dL (ref 1.7–2.4)

## 2022-01-13 LAB — PHOSPHORUS: Phosphorus: 3.4 mg/dL (ref 2.5–4.6)

## 2022-01-13 MED ORDER — ENOXAPARIN SODIUM 40 MG/0.4ML IJ SOSY
40.0000 mg | PREFILLED_SYRINGE | INTRAMUSCULAR | Status: DC
  Administered 2022-01-14 – 2022-01-15 (×2): 40 mg via SUBCUTANEOUS
  Filled 2022-01-13 (×2): qty 0.4

## 2022-01-13 MED ORDER — HALOPERIDOL 5 MG PO TABS
5.0000 mg | ORAL_TABLET | Freq: Every day | ORAL | Status: DC
  Administered 2022-01-13 – 2022-01-14 (×2): 5 mg via ORAL
  Filled 2022-01-13 (×3): qty 1

## 2022-01-13 NOTE — Progress Notes (Addendum)
NAME:  ROMEL GIBAS, MRN:  [id number], DOB:  07-18-1960, LOS: 3 ADMISSION DATE:  01/09/2022  Subjective  NAEON/Overnight events: None  Patient evaluated at bedside this AM. Patient was sleepy but arousable this morning, reports feeling fine. He is oriented to person and place only, quickly falls back asleep between questions. Follows instructions intermittently. Discussed plan to adjust medications and for physical therapy evaluation later today.   Objective   Blood pressure 137/89, pulse 71, temperature 98 F (36.7 C), temperature source Oral, resp. rate 13, weight 98.4 kg, SpO2 98 %.     Intake/Output Summary (Last 24 hours) at 01/13/2022 1105 Last data filed at 01/13/2022 0829 Gross per 24 hour  Intake --  Output 800 ml  Net -800 ml   Filed Weights   01/10/22 0500 01/11/22 0310  Weight: 102.6 kg 98.4 kg    Physical Exam: General: somnolent, obese African American male in no acute distress. Food crumbs on his lips. CV: RRR. No m/r/g. Pulm: Normal WOB on 2L O2. CTAB. Abdomen: Soft, non-tender, non-distended Neuro: Alert and oriented to person and place. Pupils constricted bilaterally. Intermittently following commands. Psych: Unable to assess due to mental status.   Labs       Latest Ref Rng & Units 01/13/2022    3:34 AM 01/12/2022    3:05 AM 01/11/2022    3:54 AM  CBC  WBC 4.0 - 10.5 K/uL 5.5  6.9  10.6   Hemoglobin 13.0 - 17.0 g/dL 10.0  10.4  11.7   Hematocrit 39.0 - 52.0 % 29.5  31.2  35.8   Platelets 150 - 400 K/uL 205  213  238       Latest Ref Rng & Units 01/13/2022    3:34 AM 01/12/2022    3:05 AM 01/11/2022    3:54 AM  BMP  Glucose 70 - 99 mg/dL 115  136  147   BUN 6 - 20 mg/dL 13  17  12    Creatinine 0.61 - 1.24 mg/dL 1.05  1.30  1.18   Sodium 135 - 145 mmol/L 140  140  140   Potassium 3.5 - 5.1 mmol/L 3.6  4.0  3.9   Chloride 98 - 111 mmol/L 105  107  103   CO2 22 - 32 mmol/L 26  26  25    Calcium 8.9 - 10.3 mg/dL 9.1  8.6  9.5     Respiratory viral panel none detected Mg 2 Phos 3.4  Imaging:  10/15 CT Chest, A/P IMPRESSION: -No evidence of aortic aneurysm. -2.4 cm rounded masslike area in the left upper lobe. Cannot exclude malignancy. Consider further evaluation with PET CT or follow-up CT in the short-term after a trial of antibiotic treatment if there is clinical concern for pneumonia. -No acute findings in the abdomen or pelvis.  10/15 Echo LVEF 65% with normal function.   Summary  Mr. Betzen is a 61 year old male with a past psychiatric hx of schizophrenia on lithium, clozapine and haldol and past medical hx of HTN, T2DM who was admitted on 10/15 with significant hypotension (SBP 50s), lethargy, and emesis and found to have a rectal temp of 100.24F, LA 2.9, mild leukocytosis 10.6. He was ultimately diagnosed with mixed hypovolemic/septic shock in the setting of pneumonia. Of note, he was recently admitted 10/11 with AMS and myoclonus thought to be secondary to elevated lithium level, requiring emergent HD.  Assessment & Plan:  Principal Problem:   Septic shock ([place]) Active Problems:   Hypotension  Type 2 diabetes mellitus (H[facility])   Schizophrenia ([place])   Acute kidney injury ([place])   Candidiasis, mouth   Pulmonary infiltrate present on computed tomography  Hospital Acquired Pneumonia BP systolics [id number] yesterday. BP 114/79 this AM. He is on day 4 of cefepime for treatment of HAP. Noted to have LUL mass on CT. Less suspicious for viral PNA given unremarkable respiratory viral panel. Bcx, UCx with no growth. Procalcitonin < 0.1 x3. He is still on supplemental O2 which is not his baseline. Another etiology to consider for his hypotension on admission is medication-induced from restarting clozapine on discharge.   -continue cefepime (10/15-10/19) -f/u expectorated sputum culture  -supplemental O2 as needed to maintain O2 sats >92%, wean O2 to room air as tolerated -PT/OT eval  Encephalopathy He  continues to be somnolent on exam today. Could be consistent with hypoactive delirium given his hospitalizations in which case symptomatic treatment would not be used. Will adjust his medications to see if his mental status improves. If he continues to be somnolent, could also consider discontinuing his donepezil or his morning clozapine. On last admission there was extensive workup for acute encephalopathy including EEG, MRI, TSH which were all unremarkable. He does appear to have cognitive impairment with low MOCA and MMSE scores noted in [facility] admission 05/2021. He needs help completing his iADLS and managing his medications.  -decrease haldol to 5mg  QHS   Chronic HTN BP stable today. Should BP decrease again, would consider changing antihypertensive therapy from clonidine to either norvasc or atenolol. -continue clonidine -holding norvasc and atenolol -if precipitous drop in BP with clonidine, change to atenolol or norvasc  T2DM with hyperglycemia On lantus 15u BID, novolog 7u TID with meals, and metformin 1000mg  BID at home. Restarted home lantus at half dose (15u qhs) yesterday. Fasting glucose 124 this AM.  -continue resistant SSI -semglee 15u qhs -goal CBGs 140-180  Normocytic anemia  Hgb downtrending since last admission. Could be dilutional in the setting of receiving IVF. Will continue to monitor. No signs or symptoms of bleeding. Would consider further work-up outpatient.   Oral candidiasis He was started on nystatin suspension QID for 7 day course.  -continue nystatin QID (10/16-10/22)  Schizophrenia Follows psychiatry in the outpatient setting. Continue bowel regimen with being on clozapine. Last recorded bowel movement was 3 days ago.  -decrease haldol to 5mg  QHS  -continue donepezil 5mg  QHS -continue clozapine 25mg  every morning and 75mg  qhs  Best practice:  DIET: Carb modified  IVF: None DVT PPX: Heparin BOWEL: miralax, senna CODE: FULL FAM COM: does not have legal  guardian, lives at group home. Attempted to call Anitha to give update but no one picked up.    PT/OT recs: pending  Dispo: group home Barriers to discharge: medical stability   Rolanda Lundborg, MD Internal Medicine Resident PGY-1 PAGER: [phone] 01/13/2022 11:05 AM  If after hours (below), please contact on-call pager: [phone] 5PM-7AM Monday-Friday 1PM-7AM Saturday-Sunday

## 2022-01-13 NOTE — Progress Notes (Addendum)
Mobility Specialist Progress Note   01/13/22 1759  Mobility  Activity Transferred from chair to bed  Level of Assistance +2 (takes two people)  Games developer wheel walker  Distance Ambulated (ft) 4 ft  Activity Response Tolerated well  $Mobility charge 1 Mobility   Received pt in chair requesting to get back to bed. MinA +2 for physical assist, mod directional cues given d/t state of lethargy and cognitive delay but able to get to bed safely w/o fault. Placed call bell in reach w/ all needs met.    Holland Falling Mobility Specialist MS [facility] #:  [phone] Acute Rehab Office:  [phone]

## 2022-01-13 NOTE — Evaluation (Signed)
Occupational Therapy Evaluation Patient Details Name: Mitchell [name] MRN: [id number] DOB: 1961-01-31 Today's Date: 01/13/2022   History of Present Illness 61 y/o male presented to ED on 01/10/22 for AMS after being discharged from [facility] on 10/14. Admitted for hypovolemic/septic shock 2/2 PNA. PMH: bipolar disorder, schizophrenia, h/o lithium toxicity, HTN, DM2, OSA, CVA with residual L arm weakness. (Simultaneous filing. User may not have seen previous data.)   Clinical Impression   Pt typically functions independently in self care and mobility at his group home. Presents with generalized weakness, impaired cognition and decreased sitting and standing balance. He requires mod assist for bed mobility, to stand and take steps to chair. He requires set up to total assist for ADLs. Recommending SNF for rehab prior to return home.      Recommendations for follow up therapy are one component of a multi-disciplinary discharge planning process, led by the attending physician.  Recommendations may be updated based on patient status, additional functional criteria and insurance authorization.   Follow Up Recommendations  Skilled nursing-short term rehab (<3 hours/day)    Assistance Recommended at Discharge Frequent or constant Supervision/Assistance  Patient can return home with the following A lot of help with walking and/or transfers;A lot of help with bathing/dressing/bathroom;Assistance with cooking/housework;Direct supervision/assist for medications management;Direct supervision/assist for financial management;Assist for transportation;Help with stairs or ramp for entrance    Functional Status Assessment  Patient has had a recent decline in their functional status and demonstrates the ability to make significant improvements in function in a reasonable and predictable amount of time.  Equipment Recommendations  Other (comment) (defer to next venue)    Recommendations for Other Services        Precautions / Restrictions Precautions Precautions: Fall (Simultaneous filing. User may not have seen previous data.) Restrictions Weight Bearing Restrictions: No (Simultaneous filing. User may not have seen previous data.)      Mobility Bed Mobility Overal bed mobility: Needs Assistance (Simultaneous filing. User may not have seen previous data.) Bed Mobility: Rolling, Sidelying to Sit (Simultaneous filing. User may not have seen previous data.) Rolling: Mod assist Sidelying to sit: Mod assist       General bed mobility comments: pt able to advance LEs to EOB, assist of bed pad to roll and to raise trunk, pt able to scoot hips to EOB with increased time and multimodal cues (Simultaneous filing. User may not have seen previous data.)    Transfers Overall transfer level: Needs assistance (Simultaneous filing. User may not have seen previous data.) Equipment used: Rolling walker (2 wheels) (Simultaneous filing. User may not have seen previous data.) Transfers: Sit to/from Stand (Simultaneous filing. User may not have seen previous data.) Sit to Stand: Mod assist (Simultaneous filing. User may not have seen previous data.)           General transfer comment: assist to rise and steady, pt with posterior lean (Simultaneous filing. User may not have seen previous data.)      Balance Overall balance assessment: Needs assistance (Simultaneous filing. User may not have seen previous data.)   Sitting balance-Leahy Scale: Poor (Simultaneous filing. User may not have seen previous data.)     Standing balance support: Bilateral upper extremity supported, During functional activity, Reliant on assistive device for balance (Simultaneous filing. User may not have seen previous data.) Standing balance-Leahy Scale: Poor (Simultaneous filing. User may not have seen previous data.) Standing balance comment: reliant on RW and assist  ADL either performed or  assessed with clinical judgement   ADL Overall ADL's : Needs assistance/impaired Eating/Feeding: Set up;Sitting   Grooming: Wash/dry face;Sitting;Set up   Upper Body Bathing: Moderate assistance;Sitting   Lower Body Bathing: Maximal assistance;Sit to/from stand   Upper Body Dressing : Minimal assistance;Sitting   Lower Body Dressing: Total assistance;Sit to/from stand   Toilet Transfer: Moderate assistance;Ambulation;Rolling walker (2 wheels)           Functional mobility during ADLs: Moderate assistance;Rolling walker (2 wheels)       Vision Ability to See in Adequate Light: 0 Adequate       Perception     Praxis      Pertinent Vitals/Pain Pain Assessment Pain Assessment: Faces (Simultaneous filing. User may not have seen previous data.) Faces Pain Scale: No hurt (Simultaneous filing. User may not have seen previous data.)     Hand Dominance Right   Extremity/Trunk Assessment Upper Extremity Assessment Upper Extremity Assessment: Generalized weakness (Simultaneous filing. User may not have seen previous data.) LUE Deficits / Details: residual hemiparesis form prior CVA; uses as a funcitonal assist   Lower Extremity Assessment Lower Extremity Assessment: Defer to PT evaluation (Simultaneous filing. User may not have seen previous data.)   Cervical / Trunk Assessment Cervical / Trunk Assessment: Other exceptions;Normal (obesity  Simultaneous filing. User may not have seen previous data.)   Communication Communication Communication: No difficulties (Simultaneous filing. User may not have seen previous data.)   Cognition Arousal/Alertness: Awake/alert (Simultaneous filing. User may not have seen previous data.) Behavior During Therapy: Flat affect (Simultaneous filing. User may not have seen previous data.) Overall Cognitive Status: No family/caregiver present to determine baseline cognitive functioning (Simultaneous filing. User may not have seen previous  data.) Area of Impairment: Problem solving, Following commands, Orientation, Attention (Simultaneous filing. User may not have seen previous data.)                 Orientation Level: Time, Situation Current Attention Level: Sustained (Simultaneous filing. User may not have seen previous data.)   Following Commands: Follows one step commands with increased time (Simultaneous filing. User may not have seen previous data.)   Awareness: Intellectual (Simultaneous filing. User may not have seen previous data.) Problem Solving: Slow processing, Decreased initiation, Difficulty sequencing, Requires verbal cues (Simultaneous filing. User may not have seen previous data.) General Comments: increased response time required at times (Simultaneous filing. User may not have seen previous data.)     General Comments       Exercises     Shoulder Instructions      Home Living Family/patient expects to be discharged to:: Group home (Simultaneous filing. User may not have seen previous data.)                                        Prior Functioning/Environment Prior Level of Function : Independent/Modified Independent (Simultaneous filing. User may not have seen previous data.)             Mobility Comments: pt typically walks without AD ADLs Comments: independent in self care, assisted for IADLs at group home prior to last admission        OT Problem List: Decreased strength;Decreased activity tolerance;Impaired balance (sitting and/or standing);Decreased cognition;Decreased safety awareness;Decreased knowledge of use of DME or AE      OT Treatment/Interventions: Self-care/ADL training;Therapeutic exercise;DME and/or AE instruction;Therapeutic activities;Cognitive remediation/compensation;Patient/family education;Balance  training    OT Goals(Current goals can be found in the care plan section) Acute Rehab OT Goals OT Goal Formulation: Patient unable to participate  in goal setting Time For Goal Achievement: 01/27/22 Potential to Achieve Goals: Good ADL Goals Pt Will Perform Grooming: with supervision;standing Pt Will Perform Upper Body Dressing: with supervision;sitting Pt Will Perform Lower Body Dressing: with supervision;sit to/from stand Pt Will Transfer to Toilet: with supervision;ambulating;bedside commode Pt Will Perform Toileting - Clothing Manipulation and hygiene: with supervision;sit to/from stand Additional ADL Goal #1: Pt will perform bed mobility modified independently.  OT Frequency: Min 2X/week    Co-evaluation PT/OT/SLP Co-Evaluation/Treatment: Yes Reason for Co-Treatment: For patient/therapist safety   OT goals addressed during session: ADL's and self-care      AM-PAC OT "6 Clicks" Daily Activity     Outcome Measure Help from another person eating meals?: A Little Help from another person taking care of personal grooming?: A Little Help from another person toileting, which includes using toliet, bedpan, or urinal?: A Lot Help from another person bathing (including washing, rinsing, drying)?: A Lot Help from another person to put on and taking off regular upper body clothing?: A Little Help from another person to put on and taking off regular lower body clothing?: Total 6 Click Score: 14   End of Session Nurse Communication: Other (comment);Mobility status (aware 02 stayed >94% on RA)  Activity Tolerance: Patient tolerated treatment well Patient left: in chair;with call bell/phone within reach;with chair alarm set  OT Visit Diagnosis: Unsteadiness on feet (R26.81);Other abnormalities of gait and mobility (R26.89);Muscle weakness (generalized) (M62.81);Other symptoms and signs involving cognitive function                Time: 1540-0867 OT Time Calculation (min): 28 min Charges:  OT General Charges $OT Visit: 1 Visit OT Evaluation $OT Eval Moderate Complexity: [place], OTR/L Acute Rehabilitation Services Office:  [phone]  Malka So 01/13/2022, 1:34 PM

## 2022-01-13 NOTE — Evaluation (Signed)
Physical Therapy Evaluation Patient Details Name: Mitchell [name] MRN: [id number] DOB: 04/08/1960 Today's Date: 01/13/2022  History of Present Illness  61 y/o male presented to ED on 01/10/22 for AMS after being discharged from [facility] on 10/14. Admitted for mixed hypovolemic/septic shock 2/2 PNA. 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 PTA, patient from group home and was independent. Patient presents with weakness, impaired balance, decreased activity tolerance, and impaired cognition. Requiring ~10 seconds for processing commands. Required modA for bed mobility and sit to stand transfer. Able to perform short ambulation with RW and modA for balance and RW management. Patient will benefit from skilled PT services during acute stay to address listed deficits. Recommend SNF at discharge to maximize functional independence and safety prior to returning to group home.        Recommendations for follow up therapy are one component of a multi-disciplinary discharge planning process, led by the attending physician.  Recommendations may be updated based on patient status, additional functional criteria and insurance authorization.  Follow Up Recommendations Skilled nursing-short term rehab (<3 hours/day) Can patient physically be transported by private vehicle: Yes    Assistance Recommended at Discharge Frequent or constant Supervision/Assistance  Patient can return home with the following  A little help with walking and/or transfers;A little help with bathing/dressing/bathroom;Assistance with cooking/housework;Direct supervision/assist for medications management;Direct supervision/assist for financial management;Assist for transportation;Help with stairs or ramp for entrance    Equipment Recommendations None recommended by PT   Recommendations for Other Services       Functional Status Assessment Patient has had a recent decline in their functional status and demonstrates the ability to make significant improvements in function in a reasonable and predictable amount of time.     Precautions / Restrictions Precautions Precautions: Fall Restrictions Weight Bearing Restrictions: No      Mobility  Bed Mobility Overal bed mobility: Needs Assistance Bed Mobility: Supine to Sit     Supine to sit: Mod assist     General bed mobility comments: cues for sequencing. Increased time to complete due to processing time. Assist for LE management and trunk elevation.    Transfers Overall transfer level: Needs assistance Equipment used: Rolling Blakely Maranan (2 wheels) Transfers: Sit to/from Stand Sit to Stand: Mod assist           General transfer comment: assist to rise and steady    Ambulation/Gait Ambulation/Gait assistance: Mod assist, +2 safety/equipment Gait Distance (Feet): 10 Feet Assistive device: Rolling Kiora Hallberg (2 wheels) Gait Pattern/deviations: Step-through pattern, Decreased step length - left, Decreased stride length, Shuffle Gait velocity: decreased     General Gait Details: at times with difficulty advancing L LE. Small shuffling steps requiring modA due to balance, posterior bias, and RW management  Stairs            Wheelchair Mobility    Modified Rankin (Stroke Patients Only)       Balance Overall balance assessment: Needs assistance Sitting-balance support: No upper extremity supported, Feet supported Sitting balance-Leahy Scale: Fair Sitting balance - Comments: posterior lean with fatigue   Standing balance support: Bilateral upper extremity supported, During functional activity, Reliant on assistive device for balance Standing balance-Leahy Scale: Poor                               Pertinent Vitals/Pain  Pain Assessment Pain Assessment: Faces Faces Pain  Scale: No hurt Pain Intervention(s): Monitored during session    Home Living Family/patient expects to be discharged to:: Group home                        Prior Function Prior Level of Function : Independent/Modified Independent;History of Falls (last six months)                     Hand Dominance        Extremity/Trunk Assessment   Upper Extremity Assessment Upper Extremity Assessment: Defer to OT evaluation    Lower Extremity Assessment Lower Extremity Assessment: Generalized weakness    Cervical / Trunk Assessment Cervical / Trunk Assessment: Normal  Communication   Communication: Other (comment) (delayed responses)  Cognition Arousal/Alertness: Lethargic Behavior During Therapy: Flat affect Overall Cognitive Status: No family/caregiver present to determine baseline cognitive functioning Area of Impairment: Attention, Following commands, Safety/judgement, Awareness, Problem solving                   Current Attention Level: Focused   Following Commands: Follows one step commands inconsistently, Follows one step commands with increased time Safety/Judgement: Decreased awareness of deficits, Decreased awareness of safety Awareness: Intellectual Problem Solving: Slow processing, Decreased initiation, Difficulty sequencing, Requires verbal cues General Comments: requires signifcant amount of time to process commands/questions (~10 seconds at times).        General Comments      Exercises     Assessment/Plan    PT Assessment Patient needs continued PT services  PT Problem List Decreased strength;Decreased activity tolerance;Decreased balance;Decreased mobility;Decreased coordination;Decreased cognition;Decreased knowledge of use of DME;Decreased safety awareness;Impaired sensation       PT Treatment Interventions DME instruction;Gait training;Stair training;Functional mobility training;Therapeutic activities;Therapeutic exercise;Balance  training;Neuromuscular re-education;Cognitive remediation;Patient/family education    PT Goals (Current goals can be found in the Care Plan section)  Acute Rehab PT Goals Patient Stated Goal: did not state PT Goal Formulation: Patient unable to participate in goal setting Time For Goal Achievement: 01/27/22 Potential to Achieve Goals: Good    Frequency Min 2X/week     Co-evaluation               AM-PAC PT "6 Clicks" Mobility  Outcome Measure Help needed turning from your back to your side while in a flat bed without using bedrails?: A Lot Help needed moving from lying on your back to sitting on the side of a flat bed without using bedrails?: A Lot Help needed moving to and from a bed to a chair (including a wheelchair)?: A Lot Help needed standing up from a chair using your arms (e.g., wheelchair or bedside chair)?: A Lot Help needed to walk in hospital room?: A Lot Help needed climbing 3-5 steps with a railing? : Total 6 Click Score: 11    End of Session   Activity Tolerance: Patient tolerated treatment well Patient left: in chair;with call bell/phone within reach;with chair alarm set Nurse Communication: Mobility status PT Visit Diagnosis: Unsteadiness on feet (R26.81);Other abnormalities of gait and mobility (R26.89);Muscle weakness (generalized) (M62.81);Difficulty in walking, not elsewhere classified (R26.2);Other symptoms and signs involving the nervous system (T62.263)    Time: 3354-5625 PT Time Calculation (min) (ACUTE ONLY): 26 min   Charges:   PT Evaluation $PT Eval Moderate Complexity: 1 Mod          Lemond Griffee A. Dan Humphreys PT, DPT Acute Rehabilitation Services Office [phone]   Inez Pilgrim  A Emitt Maglione 01/13/2022, 1:26 PM

## 2022-01-13 NOTE — Care Management Important Message (Signed)
Important Message  Patient Details  Name: Mitchell [name] MRN: [id number] Date of Birth: [birth year]   Medicare Important Message Given:  Yes     Hannah Beat 01/13/2022, 11:17 AM

## 2022-01-13 NOTE — NC FL2 (Signed)
[place] LEVEL OF CARE SCREENING TOOL     IDENTIFICATION  Patient Name: Mitchell [name] Birthdate: 25-Jun-1960 Sex: male Admission Date (Current Location): 01/09/2022  [place] and [place] Number:  Kathleen Argue [id number] [place] and Address:  [facility] [place], [place] [address] [place], [place] [address]      Provider Number: [id number]  Attending Physician Name and Address:  Aldine Contes, MD  Relative Name and Phone Number:  Donny Pique [phone]    Current Level of Care: Hospital Recommended Level of Care: [place] Prior Approval Number:    Date Approved/Denied:   PASRR Number:    Discharge Plan: SNF    Current Diagnoses: Patient Active Problem List   Diagnosis Date Noted   Candidiasis, mouth 01/13/2022   Pulmonary infiltrate present on computed tomography 01/13/2022   Septic shock ([employer]) 01/10/2022   Lithium toxicity, accidental or unintentional, initial encounter 01/06/2022   Encephalopathy acute    Acute kidney injury ([place])    Schizophrenia ([place]) 04/29/2021   Lithium toxicity 10/02/2014   Hypertension    Type 2 diabetes mellitus ([place])    Sleep apnea    Hyponatremia 03/13/2011   Hypotension 05/12/2010    Orientation RESPIRATION BLADDER Height & Weight     Self  O2 (2L) Incontinent Weight: 216 lb 14.9 oz (98.4 kg) Height:     BEHAVIORAL SYMPTOMS/MOOD NEUROLOGICAL BOWEL NUTRITION STATUS      Incontinent Diet (see d/c summary)  AMBULATORY STATUS COMMUNICATION OF NEEDS Skin   Extensive Assist Verbally Normal                       Personal Care Assistance Level of Assistance  Bathing, Feeding, Dressing Bathing Assistance: Limited assistance Feeding assistance: Limited assistance Dressing Assistance: Limited assistance     Functional Limitations Info  Sight, Hearing, Speech Sight Info: Adequate Hearing Info: Adequate Speech Info: Adequate    SPECIAL CARE FACTORS FREQUENCY  OT (By  licensed OT), PT (By licensed PT)     PT Frequency: 5x/ week OT Frequency: 5x/ week            Contractures      Additional Factors Info  Code Status, Allergies, Psychotropic, Insulin Sliding Scale Code Status Info: Full Allergies Info: NKA Psychotropic Info: lithium, clozapine, Haldol Insulin Sliding Scale Info: See d/c medication list       Current Medications (01/13/2022):  This is the current hospital active medication list Current Facility-Administered Medications  Medication Dose Route Frequency Provider Last Rate Last Admin   acetaminophen (TYLENOL) suppository 650 mg  650 mg Rectal Q6H PRN Andres Labrum D, PA-C   650 mg at 01/10/22 1605   ceFEPIme (MAXIPIME) 2 g in sodium chloride 0.9 % 100 mL IVPB  2 g Intravenous Q8H Henri Medal, RPH 200 mL/hr at 01/13/22 1419 2 g at 01/13/22 1419   Chlorhexidine Gluconate Cloth 2 % PADS 6 each  6 each Topical Daily Rigoberto Noel, MD   6 each at 01/13/22 1416   cloNIDine (CATAPRES) tablet 0.2 mg  0.2 mg Oral BID Idamae Schuller, MD   0.2 mg at 01/13/22 0947   cloZAPine (CLOZARIL) tablet 25 mg  25 mg Oral q AM Idamae Schuller, MD   25 mg at 01/13/22 0946   And   cloZAPine (CLOZARIL) tablet 75 mg  75 mg Oral QHS Idamae Schuller, MD   75 mg at 01/12/22 2134   donepezil (ARICEPT) tablet 5 mg  5 mg Oral QHS Gwenevere Abbot, MD   5 mg at 01/12/22 2133   guaiFENesin (ROBITUSSIN) 100 MG/5ML liquid 5 mL  5 mL Oral Q4H Gwenevere Abbot, MD   5 mL at 01/13/22 1415   haloperidol (HALDOL) tablet 5 mg  5 mg Oral QHS Karie Fetch, MD       heparin injection 5,000 Units  5,000 Units Subcutaneous Q8H Lidia Collum, PA-C   5,000 Units at 01/13/22 1416   insulin aspart (novoLOG) injection 0-20 Units  0-20 Units Subcutaneous TID WC Gwenevere Abbot, MD   3 Units at 01/13/22 1219   insulin aspart (novoLOG) injection 0-5 Units  0-5 Units Subcutaneous QHS Gwenevere Abbot, MD   2 Units at 01/12/22 2130   insulin glargine-yfgn (SEMGLEE) injection 15 Units  15 Units Subcutaneous  QHS Merrilyn Puma, MD   15 Units at 01/12/22 2130   naphazoline-glycerin (CLEAR EYES REDNESS) ophth solution 1-2 drop  1-2 drop Both Eyes QID PRN Larinda Buttery, MD   2 drop at 01/11/22 0306   nystatin (MYCOSTATIN) 100000 UNIT/ML suspension 500,000 Units  5 mL Oral QID Gwenevere Abbot, MD   500,000 Units at 01/13/22 1415   Oral care mouth rinse  15 mL Mouth Rinse PRN Oretha Milch, MD       polyethylene glycol (MIRALAX / GLYCOLAX) packet 17 g  17 g Oral Daily Merrilyn Puma, MD   17 g at 01/13/22 3664   senna-docusate (Senokot-S) tablet 1 tablet  1 tablet Oral BID Gwenevere Abbot, MD   1 tablet at 01/13/22 4034     Discharge Medications: Please see discharge summary for a list of discharge medications.  Relevant Imaging Results:  Relevant Lab Results:   Additional Information S.S. #[id number]  Ralene Bathe, LCSWA

## 2022-01-14 DIAGNOSIS — G9340 Encephalopathy, unspecified: Secondary | ICD-10-CM | POA: Diagnosis not present

## 2022-01-14 DIAGNOSIS — R6521 Severe sepsis with septic shock: Secondary | ICD-10-CM | POA: Diagnosis not present

## 2022-01-14 DIAGNOSIS — A419 Sepsis, unspecified organism: Secondary | ICD-10-CM | POA: Diagnosis not present

## 2022-01-14 DIAGNOSIS — J189 Pneumonia, unspecified organism: Secondary | ICD-10-CM | POA: Diagnosis not present

## 2022-01-14 LAB — GLUCOSE, CAPILLARY
Glucose-Capillary: 125 mg/dL — ABNORMAL HIGH (ref 70–99)
Glucose-Capillary: 233 mg/dL — ABNORMAL HIGH (ref 70–99)
Glucose-Capillary: 223 mg/dL — ABNORMAL HIGH (ref 70–99)
Glucose-Capillary: 192 mg/dL — ABNORMAL HIGH (ref 70–99)
Glucose-Capillary: 160 mg/dL — ABNORMAL HIGH (ref 70–99)

## 2022-01-14 LAB — CBC
HCT: 31.1 % — ABNORMAL LOW (ref 39.0–52.0)
Hemoglobin: 10.5 g/dL — ABNORMAL LOW (ref 13.0–17.0)
MCH: 30.3 pg (ref 26.0–34.0)
MCHC: 33.8 g/dL (ref 30.0–36.0)
MCV: 89.9 fL (ref 80.0–100.0)
Platelets: 226 10*3/uL (ref 150–400)
RBC: 3.46 MIL/uL — ABNORMAL LOW (ref 4.22–5.81)
RDW: 13.2 % (ref 11.5–15.5)
WBC: 5.2 10*3/uL (ref 4.0–10.5)
nRBC: 0 % (ref 0.0–0.2)

## 2022-01-14 LAB — PHOSPHORUS: Phosphorus: 3.7 mg/dL (ref 2.5–4.6)

## 2022-01-14 LAB — BASIC METABOLIC PANEL
Anion gap: 10 (ref 5–15)
BUN: 11 mg/dL (ref 6–20)
CO2: 25 mmol/L (ref 22–32)
Calcium: 8.9 mg/dL (ref 8.9–10.3)
Chloride: 105 mmol/L (ref 98–111)
Creatinine, Ser: 1 mg/dL (ref 0.61–1.24)
GFR, Estimated: >60 mL/min (ref >60)
Glucose, Bld: 104 mg/dL — ABNORMAL HIGH (ref 70–99)
Potassium: 3.4 mmol/L — ABNORMAL LOW (ref 3.5–5.1)
Sodium: 140 mmol/L (ref 135–145)

## 2022-01-14 LAB — MAGNESIUM: Magnesium: 2 mg/dL (ref 1.7–2.4)

## 2022-01-14 MED ORDER — BISACODYL 10 MG RE SUPP
10.0000 mg | Freq: Once | RECTAL | Status: AC
  Administered 2022-01-14: 10 mg via RECTAL
  Filled 2022-01-14: qty 1

## 2022-01-14 MED ORDER — POTASSIUM CHLORIDE CRYS ER 20 MEQ PO TBCR
40.0000 meq | EXTENDED_RELEASE_TABLET | Freq: Two times a day (BID) | ORAL | Status: AC
  Administered 2022-01-14 (×2): 40 meq via ORAL
  Filled 2022-01-14 (×2): qty 2

## 2022-01-14 NOTE — Plan of Care (Signed)

## 2022-01-14 NOTE — Progress Notes (Signed)
RE:  Mitchell [name]       Date of Birth:  [birth year]     Date:   01/14/22       To Whom It May Concern:  Please be advised that the above-named patient will require a short-term nursing home stay - anticipated 30 days or less for rehabilitation and strengthening.  The plan is for return home.                 MD signature                Date

## 2022-01-14 NOTE — TOC Progression Note (Addendum)
Transition of Care [facility]) - Progression Note    Patient Details  Name: Mitchell [name] MRN: [id number] Date of Birth: Oct 28, 1960  Transition of Care [facility]) CM/SW Contact  Joanne Chars, LCSW Phone Number: 01/14/2022, 9:56 AM  Clinical Narrative:   CSW spoke with pt regarding DC recommendation for SNF.  Pt listed as oriented x1 but able to participate in conversation.  Pt reports he is his own guardian, does not have appointed legal guardian.  Pt reports he does not communicate with his son and daughter listed on the face sheet.  Discussed SNF vs going back to the group home, pt saying he would like to go to SNF.  Choice document provided with bed offers, pt would like to accept offer at [facility].  1030: CSW spoke with Anitha from the group home.  She has questions regarding pt medical status that she needs answered prior to pt returning.  Discussed that pt is saying he will go to SNF and that is not necessarily a problem, but she wants to speak to MD.  MD informed.  1400: CSW received confirmation from [facility] that they can accept pt.  Auth request submitted in [place] and approved: [id number], 5 days: 10/20-10/24.  1415: level 2 passr docs uploaded  1455: PASSR received: [id number]  Expected Discharge Plan: Group Home Barriers to Discharge: Continued Medical Work up  Expected Discharge Plan and Services Expected Discharge Plan: Group Home In-house Referral: Clinical Social Work   Post Acute Care Choice:  (TBD) Living arrangements for the past 2 months: Group Home                                       Social Determinants of Health (SDOH) Interventions    Readmission Risk Interventions     No data to display

## 2022-01-14 NOTE — Progress Notes (Signed)
NAME:  Mitchell [name], MRN:  [id number], DOB:  [birth year], LOS: 4 ADMISSION DATE:  01/09/2022  Subjective  NAEON/Overnight events: None  Patient evaluated at bedside this AM. Eating well. Working with PT/OT. No bowel movement. Doesn't really remember acute phase of illness. "I feel pretty good, I'm getting there." Denies confusion, auditory/visual hallucination. Alert and oriented to person place. Discussed treatment for constipation, workup for anemia, and discharge planning.  Objective   Blood pressure 116/82, pulse 78, temperature 97.9 F (36.6 C), temperature source Oral, resp. rate 13, weight 98.4 kg, SpO2 98 %.     Intake/Output Summary (Last 24 hours) at 01/14/2022 [id number] Last data filed at 01/13/2022 1805 Gross per 24 hour  Intake 360 ml  Output 1800 ml  Net -1440 ml   Ate 25% breakfast Ate 50% lunch  Filed Weights   01/10/22 0500 01/11/22 0310  Weight: 102.6 kg 98.4 kg    Physical Exam: General: alert, chronically ill appearing older, obese African American male in no acute distress CV: RRR. No m/r/g Pulm: CTAB. Normal WOB on RA Abdomen: Non-tender, somewhat distended.  Neuro: alert and oriented to self, place, city. Not oriented to time. No cogwheeling noted in extremities. Psych: Denies auditory and visual hallucinations.   Labs       Latest Ref Rng & Units 01/14/2022    2:55 AM 01/13/2022    3:34 AM 01/12/2022    3:05 AM  CBC  WBC 4.0 - 10.5 K/uL 5.2  5.5  6.9   Hemoglobin 13.0 - 17.0 g/dL 10.5  10.0  10.4   Hematocrit 39.0 - 52.0 % 31.1  29.5  31.2   Platelets 150 - 400 K/uL 226  205  213       Latest Ref Rng & Units 01/14/2022    2:55 AM 01/13/2022    3:34 AM 01/12/2022    3:05 AM  BMP  Glucose 70 - 99 mg/dL 104  115  136   BUN 6 - 20 mg/dL 11  13  17    Creatinine 0.61 - 1.24 mg/dL 1.00  1.05  1.30   Sodium 135 - 145 mmol/L 140  140  140   Potassium 3.5 - 5.1 mmol/L 3.4  3.6  4.0   Chloride 98 - 111 mmol/L 105  105  107   CO2 22 - 32 mmol/L  25  26  26    Calcium 8.9 - 10.3 mg/dL 8.9  9.1  8.6      Summary  Mitchell [name] is a 61 year old male with a past psychiatric hx of schizophrenia on lithium, clozapine and haldol and past medical hx of HTN, T2DM who was admitted on 10/15 with significant hypotension (SBP 50s), lethargy, and emesis and found to have a rectal temp of 100.19F, LA 2.9, mild leukocytosis 10.6. He was ultimately diagnosed with mixed hypovolemic/septic shock in the setting of pneumonia. Of note, he was recently admitted 10/11 with AMS and myoclonus thought to be secondary to elevated lithium level, requiring emergent HD.  Assessment & Plan:  Principal Problem:   Septic shock ([place]) Active Problems:   Hypotension   Type 2 diabetes mellitus (H[facility])   Schizophrenia ([facility])   Acute kidney injury ([place])   Candidiasis, mouth   Pulmonary infiltrate present on computed tomography  Hospital acquired pneumonia BP systolics stable yesterday. BP 116/82 this AM. He is on day 5 of cefepime for treatment of HAP. Noted to have LUL mass on CT. Less suspicious for viral PNA given unremarkable  respiratory viral panel. Bcx, UCx with no growth. Procalcitonin < 0.1 x3. He is weaned to RA at this time. Another etiology to consider for his hypotension on admission is medication-induced from restarting clozapine on discharge.   -complete course of cefepime (10/15-10/19) -f/u expectorated sputum culture  -supplemental O2 as needed to maintain O2 sats >92%, wean O2 to room air as tolerated   Encephalopathy, resolving He is more alert on exam today. Suspect resolving acute illness and possibly decreasing haldol contributing to improvement in mental status. At baseline, he needs help completing his iADLS and managing his medications.  -continue haldol decrease to 5mg  QHS    Chronic HTN BP stable today. Should BP decrease again, would consider changing antihypertensive therapy from clonidine to either norvasc or atenolol. -continue  clonidine -holding norvasc and atenolol -if precipitous drop in BP with clonidine, change to atenolol or norvasc   T2DM with hyperglycemia On lantus 15u BID, novolog 7u TID with meals, and metformin 1000mg  BID at home. Fasting glucose 125 this AM. He has improved PO intake.  -continue resistant SSI -semglee 15u qhs -goal CBGs 140-180  Hypokalemia  -Replete K   Normocytic anemia  Hgb stable. No signs or symptoms of bleeding. Given concern for side effects of clozapine will further evaluate ANC tomorrow.  -f/u CBC with diff AM  -f/u retic count    Oral candidiasis He was started on nystatin suspension QID for 7 day course.  -continue nystatin QID (10/16-10/22)   Schizophrenia Follows psychiatry in the outpatient setting. Continue bowel regimen with being on clozapine. Still hasn't had a BM. Will add suppository.  -decreased haldol to 5mg  QHS  -continue donepezil 5mg  QHS -continue clozapine 25mg  every morning and 75mg  qhs -add dulcolax suppository -continue to monitor for BM  Social Work Needs Spoke with Anitha at group home. Discussed patient's clinical course. She is concerned about patient's ability to ambulate and his ability to recover at [facility]. She reports he does not have a guardian and denies family contacts. She reports she will come to the hospital and assess the patient today.  -continue working on discharge planning for patient  -f/u with Anitha re her assessment of his baseline  Best practice:  DIET: Carb modified  IVF: None DVT PPX: lovenox BOWEL: miralax, senna, dulcolax  CODE: FULL FAM COM: Discussed with Anitha at group home re patient's current course and asked her to come in and see him   PT/OT recs: SNF Dispo: SNF Barriers to discharge: monitor for BM, discharge planning with group home  Rolanda Lundborg, MD Internal Medicine Resident PGY-1 PAGER: [phone] 01/14/2022 6:23 AM  If after hours (below), please contact on-call pager:  [phone] 5PM-7AM Monday-Friday 1PM-7AM Saturday-Sunday

## 2022-01-14 NOTE — Progress Notes (Signed)
Mobility Specialist Progress Note   01/14/22 1525  Mobility  Activity Transferred from chair to bed  Level of Assistance +2 (takes two people) Education officer, museum)  Geographical information systems officer  Distance Ambulated (ft) 4 ft  Activity Response Tolerated well  $Mobility charge 1 Mobility   RN requesting assistance to get pt from chair to bed d/t fatigue. Required MinA +2 throughout for safety with use min directional cues d/t delayed processing. Pt left at EOB with RN and NT tending to pt.   Holland Falling Mobility Specialist Acute Rehab Office:  [phone]

## 2022-01-14 NOTE — Progress Notes (Signed)
Mobility Specialist Progress Note   01/14/22 1355  Mobility  Activity Transferred from bed to chair  Level of Assistance Minimal assist, patient does 75% or more  Assistive Device Front wheel walker  Distance Ambulated (ft) 2 ft  Activity Response Tolerated well  $Mobility charge 1 Mobility   Received pt in bed stating to have soiled the bed and requesting to be clean. Able to get EOB and standing w/ minA for pericare, pt able to stand for ~71mins then able to transfer to chair, slight posterior lean when ambulating. Placed in chair safely w/ NT present tending to pt.   Holland Falling Mobility Specialist Acute Rehab Office:  [phone]

## 2022-01-15 DIAGNOSIS — E861 Hypovolemia: Secondary | ICD-10-CM

## 2022-01-15 LAB — CBC WITH DIFFERENTIAL/PLATELET
Abs Immature Granulocytes: 0.02 10*3/uL (ref 0.00–0.07)
Basophils Absolute: 0.1 10*3/uL (ref 0.0–0.1)
Basophils Relative: 1 %
Eosinophils Absolute: 0.3 10*3/uL (ref 0.0–0.5)
Eosinophils Relative: 6 %
HCT: 30.4 % — ABNORMAL LOW (ref 39.0–52.0)
Hemoglobin: 10.6 g/dL — ABNORMAL LOW (ref 13.0–17.0)
Immature Granulocytes: 0 %
Lymphocytes Relative: 39 %
Lymphs Abs: 2.1 10*3/uL (ref 0.7–4.0)
MCH: 31.1 pg (ref 26.0–34.0)
MCHC: 34.9 g/dL (ref 30.0–36.0)
MCV: 89.1 fL (ref 80.0–100.0)
Monocytes Absolute: 0.4 10*3/uL (ref 0.1–1.0)
Monocytes Relative: 8 %
Neutro Abs: 2.4 10*3/uL (ref 1.7–7.7)
Neutrophils Relative %: 46 %
Platelets: 243 10*3/uL (ref 150–400)
RBC: 3.41 MIL/uL — ABNORMAL LOW (ref 4.22–5.81)
RDW: 13.8 % (ref 11.5–15.5)
WBC: 5.4 10*3/uL (ref 4.0–10.5)
nRBC: 0 % (ref 0.0–0.2)

## 2022-01-15 LAB — COMPREHENSIVE METABOLIC PANEL
ALT: 19 U/L (ref 0–44)
AST: 21 U/L (ref 15–41)
Albumin: 3 g/dL — ABNORMAL LOW (ref 3.5–5.0)
Alkaline Phosphatase: 114 U/L (ref 38–126)
Anion gap: 7 (ref 5–15)
BUN: 10 mg/dL (ref 6–20)
CO2: 23 mmol/L (ref 22–32)
Calcium: 8.7 mg/dL — ABNORMAL LOW (ref 8.9–10.3)
Chloride: 107 mmol/L (ref 98–111)
Creatinine, Ser: 1.08 mg/dL (ref 0.61–1.24)
GFR, Estimated: >60 mL/min (ref >60)
Glucose, Bld: 176 mg/dL — ABNORMAL HIGH (ref 70–99)
Potassium: 4.3 mmol/L (ref 3.5–5.1)
Sodium: 137 mmol/L (ref 135–145)
Total Bilirubin: 0.5 mg/dL (ref 0.3–1.2)
Total Protein: 6.5 g/dL (ref 6.5–8.1)

## 2022-01-15 LAB — RETICULOCYTES
Immature Retic Fract: 37.6 % — ABNORMAL HIGH (ref 2.3–15.9)
RBC.: 3.44 MIL/uL — ABNORMAL LOW (ref 4.22–5.81)
Retic Count, Absolute: 102.9 10*3/uL (ref 19.0–186.0)
Retic Ct Pct: 3 % (ref 0.4–3.1)

## 2022-01-15 LAB — GLUCOSE, CAPILLARY
Glucose-Capillary: 187 mg/dL — ABNORMAL HIGH (ref 70–99)
Glucose-Capillary: 208 mg/dL — ABNORMAL HIGH (ref 70–99)

## 2022-01-15 LAB — CULTURE, BLOOD (ROUTINE X 2)
Culture: NO GROWTH
Culture: NO GROWTH
Special Requests: ADEQUATE

## 2022-01-15 MED ORDER — NYSTATIN 100000 UNIT/ML MT SUSP: 5.0000 mL | Freq: Four times a day (QID) | OROMUCOSAL | 0 refills | Status: AC

## 2022-01-15 MED ORDER — INSULIN GLARGINE 100 UNIT/ML ~~LOC~~ SOLN: 15.0000 [IU] | Freq: Every day | SUBCUTANEOUS | 0 refills | Status: DC

## 2022-01-15 NOTE — Plan of Care (Signed)

## 2022-01-15 NOTE — Discharge Summary (Signed)
Name: Mitchell [name] MRN: [id number] DOB: Oct 12, 1960 61 y.o. PCP: Pcp, No  Date of Admission: 01/09/2022 11:43 PM Date of Discharge: 01/15/22 Attending Physician: Dr. Dareen Piano  Discharge Diagnosis: Principal Problem:   Septic shock [facility]) Active Problems:   Hypotension   Type 2 diabetes mellitus ([place])   Schizophrenia ([place])   Acute kidney injury ([place])   Candidiasis, mouth   Pulmonary infiltrate present on computed tomography    Discharge Medications: Allergies as of 01/15/2022   No Known Allergies      Medication List     STOP taking these medications    amLODipine 10 MG tablet Commonly known as: NORVASC   atenolol 25 MG tablet Commonly known as: TENORMIN   insulin aspart 100 UNIT/ML injection Commonly known as: novoLOG   lithium carbonate 450 MG CR tablet Commonly known as: ESKALITH       TAKE these medications    aspirin EC 81 MG tablet Take 1 tablet (81 mg total) by mouth daily. Swallow whole.   cloNIDine 0.2 MG tablet Commonly known as: CATAPRES Take 0.2 mg by mouth 2 (two) times daily.   cloZAPine 25 MG tablet Commonly known as: CLOZARIL Take 3 tablets (75 mg total) by mouth at bedtime. What changed:  how much to take when to take this additional instructions   clozapine 50 MG tablet Commonly known as: CLOZARIL Take 0.5 tablets (25 mg total) by mouth in the morning. What changed: Another medication with the same name was changed. Make sure you understand how and when to take each.   donepezil 5 MG tablet Commonly known as: ARICEPT Take 1 tablet (5 mg total) by mouth at bedtime.   haloperidol 5 MG tablet Commonly known as: HALDOL Take 1 tablet (5 mg total) by mouth at bedtime. What changed: when to take this   insulin glargine 100 UNIT/ML injection Commonly known as: LANTUS Inject 0.15 mLs (15 Units total) into the skin at bedtime. What changed: when to take this   metFORMIN 1000 MG tablet Commonly known as: GLUCOPHAGE Take  1,000 mg by mouth 2 (two) times daily.   nystatin 100000 UNIT/ML suspension Commonly known as: MYCOSTATIN Take 5 mLs (500,000 Units total) by mouth 4 (four) times daily for 2 days.   senna 8.6 MG Tabs tablet Commonly known as: SENOKOT Take 2 tablets (17.2 mg total) by mouth daily.        Disposition and follow-up:   Mitchell [name] was discharged from [facility] in stable condition.  At the hospital follow up visit please address:  1.  Follow-up: [ ]  normocytic anemia work-up [ ]  psychiatric medication regimen  [ ]  repeat CT chest in 3-4 months for LUL mass  [ ]  blood pressure medication regimen  [ ]  diabetes regimen    2.  Labs / imaging needed at time of follow-up: CBC, BMP  3.  Pending labs/ test needing follow-up: None  Follow-up Appointments:  Follow-up Information     [facility]. Go in 3 day(s).   Why: Please go to [facility] for your follow-up psychiatry appointment. Contact information: [facility]: Zerita Boers Address: [address]  [place], [place] [address]  [phone]                Hospital Course by problem list: Mitchell [name] is a 61 year old male with a past psychiatric hx of schizophrenia on lithium, clozapine and haldol and past medical hx of HTN, T2DM who was  admitted on 10/15 with significant hypotension (SBP 50s), lethargy, and emesis and found to have a rectal temp of 100.8F, LA 2.9, mild leukocytosis 10.6. He was ultimately diagnosed with mixed hypovolemic/septic shock in the setting of pneumonia. Of note, he was recently admitted 10/11 with AMS and myoclonus thought to be secondary to elevated lithium level, requiring emergent HD.  Combined hypovolemic/septic shock Hospital Acquired Pneumonia BP 50s on admission with rectal T100.1, LA 2.8 . Unremarkable CK, troponin, cortisol. CT head with no acute intracranial process. CXR no active disease. Noted to have LUL mass on CT. Procalcitonin < 0.1  x3. He required ICU admission and was started on levophed. He was also started on cefepime and completed 5 day course for treatment of HAP (10/15-10/19) as he clinically improved. Less suspicious for viral PNA given unremarkable respiratory viral panel. Bcx, UCx with no growth. Echo with LVEF 65%. Another contributing factor could have been his clozapine at prior discharge. He was able to maintain his blood pressures without levophed on his home clozapine regimen and was transferred out of the ICU to the medical floor. He was afebrile without leukocytosis and maintained his blood pressures on the floor for 48 hours prior to discharge.   Encephalopathy  He was initially intermittently somnolent when he was transferred to the floor. Likely hypoactive delirium given his hospitalizations in which case symptomatic treatment would not be used. His haldol was decreased with improvement in his mental status. Of note, on prior admission there was extensive workup for acute encephalopathy including EEG, MRI, TSH which were all unremarkable. He has cognitive impairment with low MOCA and MMSE scores noted in [facility] admission 05/2021 at baseline. Per his group home manager, he needs help completing iADLS and managing his medications. He was alert, oriented to self, place, and city at time of discharge.   AKI  Baseline Cr ~1.2-1.3, on admission Cr 2.4. Likely secondary to shock. He required further IVF resuscitation at this time as he is not having much UOP (100cc recorded over past 24 hours). He was transitioned to oral fluids and regular diet. Kidney function improved was at baseline Cr 1 at time of discharge.   Pulmonary infiltrate Patient with LUL 2.4cm rounded masslike area noted on CT chest imaging. Pulmonary and critical care physicians noted will need repeat CT imaging in 3-4 months to rule out malignancy after pneumonia has been adequately treated. Repeat CT chest in 3-4 months outpatient.   Chronic HTN He has  chronic HTN but his home BP meds were held on admission due to his hypotension. His clonidine was restarted.  Norvasc and atenolol continued to be held at time of discharge.   T2DM with hyperglycemia Home meds are lantus 15u BID, novolog 7u TID with meals, and metformin 1000mg  BID at home. Restarted home lantus at half dose (15u qhs) with fasting glucose 140-180s during admission. He will need monitoring of his glucose and diabetes regimen at time of discharge.   Normocytic anemia  Hgb downtrending since last admission. Could be dilutional in the setting of receiving IVF. No signs or symptoms of bleeding. Would consider further work-up outpatient. Obtained CBC with diff and reticulocyte count which showed ANC within normal limits.    Oral candidiasis He was started on nystatin suspension QID for 7 day course (10/16-10/22).   Schizophrenia Follows psychiatry in the outpatient setting (seen by [facility]). He was continued on bowel regimen given he is on clozapine and had a bowel movement prior to dsicharge. Haldol was decreased  in the setting of lethargy and encephalopathy. He was continued on donepezil and clozapine. Lithium was held given toxicity during last admission and at discharge. He is discharged on haldol 5 QHS, clozapine 25mg  AM, 75mg  PM. He denied suicidal ideation, auditory and visual hallucinations at time of discharge. He has close psychiatric follow-up with [facility], appointment is scheduled for 10/23 at 3pm.    Subjective: Patient denies chest pain, abdominal pain, SOB. He has brief answers but is oriented to self, place, and situation. He has a half-eaten breakfast next to his bed. He is able to recall that he had a bowel movement yesterday.   Discharge Vitals:   BP 113/80   Pulse 86   Temp 98.1 F (36.7 C) (Oral)   Resp 17   Wt 216 lb 14.9 oz (98.4 kg)   SpO2 96%   BMI 37.24 kg/m   Discharge exam: General: Pleasant, chronically ill appearing, obese African  American male  in bed. No acute distress. CV: RRR. No murmurs, rubs, or gallops. No LE edema Pulmonary: Lungs CTAB. Normal effort. No wheezing or rales. Abdominal: Soft, nontender, nondistended. Normal bowel sounds. Skin: Warm and dry. No obvious rash or lesions. Neuro: A&O to self, place situation. Moves all extremities and follows commands. Normal sensation. No focal deficit. Psych: Flat, constricted affect. Answers with short responses. Denies auditory or visual hallucinations.    Pertinent Labs, Studies, and Procedures:     Latest Ref Rng & Units 01/15/2022    3:26 AM 01/14/2022    2:55 AM 01/13/2022    3:34 AM  CBC  WBC 4.0 - 10.5 K/uL 5.4  5.2  5.5   Hemoglobin 13.0 - 17.0 g/dL 10.6  10.5  10.0   Hematocrit 39.0 - 52.0 % 30.4  31.1  29.5   Platelets 150 - 400 K/uL 243  226  205        Latest Ref Rng & Units 01/15/2022    3:26 AM 01/14/2022    2:55 AM 01/13/2022    3:34 AM  CMP  Glucose 70 - 99 mg/dL 176  104  115   BUN 6 - 20 mg/dL 10  11  13    Creatinine 0.61 - 1.24 mg/dL 1.08  1.00  1.05   Sodium 135 - 145 mmol/L 137  140  140   Potassium 3.5 - 5.1 mmol/L 4.3  3.4  3.6   Chloride 98 - 111 mmol/L 107  105  105   CO2 22 - 32 mmol/L 23  25  26    Calcium 8.9 - 10.3 mg/dL 8.7  8.9  9.1   Total Protein 6.5 - 8.1 g/dL 6.5     Total Bilirubin 0.3 - 1.2 mg/dL 0.5     Alkaline Phos 38 - 126 U/L 114     AST 15 - 41 U/L 21     ALT 0 - 44 U/L 19       ECHOCARDIOGRAM COMPLETE Result Date: 01/10/2022    ECHOCARDIOGRAM REPORT   Patient Name:   GOODWIN KAMPHAUS Date of Exam: 01/10/2022 IMPRESSIONS  1. Left ventricular ejection fraction, by estimation, is 65%. The left ventricle has normal function. Left ventricular endocardial border not optimally defined to evaluate regional wall motion, appears grossly normal. There is mild left ventricular hypertrophy. Indeterminate diastolic filling due to E-A fusion.  2. Right ventricular systolic function is normal. The right ventricular size  is normal. Tricuspid regurgitation signal is inadequate for assessing PA pressure.  3. The mitral valve  is grossly normal. No evidence of mitral valve regurgitation. No evidence of mitral stenosis.  4. The aortic valve is tricuspid. Aortic valve regurgitation is not visualized. No aortic stenosis is present.  5. The inferior vena cava is normal in size with greater than 50% respiratory variability, suggesting right atrial pressure of 3 mmHg.  6. Poor acoustic windows for imaging. Electronically signed by Cherlynn Kaiser MD Signature Date/Time: 01/10/2022/9:25:57 AM    Final    CT CHEST ABDOMEN PELVIS WO CONTRAST Result Date: 01/10/2022 CLINICAL DATA:  Aortic aneurysm, known or suspected EXAM: CT CHEST, ABDOMEN AND PELVIS WITHOUT CONTRAST TECHNIQUE: IMPRESSION: No evidence of aortic aneurysm. 2.4 cm rounded masslike area in the left upper lobe. Cannot exclude malignancy. Consider further evaluation with PET CT or follow-up CT in the short-term after a trial of antibiotic treatment if there is clinical concern for pneumonia. No acute findings in the abdomen or pelvis. Electronically Signed   By: Rolm Baptise M.D.   On: 01/10/2022 02:09   CT HEAD WO CONTRAST Result Date: 01/10/2022 IMPRESSION: 1. No acute intracranial process. 2. Chronic microvascular ischemic changes. Electronically Signed   By: Brett Fairy M.D.   On: 01/10/2022 02:06   DG Chest Port 1 View Result Date: 01/10/2022 MPRESSION: No active disease. Electronically Signed   By: Brett Fairy M.D.   On: 01/10/2022 00:18      Discharge Instructions      Mr. Vanness, it has been a pleasure caring for you and I am so happy to see you are doing well!   You were hospitalized for hypotension and treated for hospital-acquired pneumonia. Since then you have recovered, you are no longer requiring antibiotics.  We also continued a lot of your home medications to manage your blood pressure, diabetes, and schizophrenia. In addition to medications,  you were also seen by our pulmonary and critical care doctors.   When you are discharged we would like you to do the following:  1. You were started on the following medications in the hospital. Please continue them using these following instructions: -please continue your nystatin for 2 days for oral candidiasis   2.  Some of your medication regimen has been changed.  Please see the following instructions for the changes: -your haldol was decreased to 5mg  nightly  -your amlodipine and atenolol were discontinued at time of discharge  -your lantus was decreased at time of discharge   3. Continue taking the rest of the medications as you were before you came to the hospital.  4. Follow-up with your psychiatrist   Take care!  Internal Medicine Teaching Service      Signed: Rolanda Lundborg, MD 01/15/2022, 10:55 AM   Pager: [phone]

## 2022-01-15 NOTE — TOC Transition Note (Signed)
Transition of Care [facility]) - CM/SW Discharge Note   Patient Details  Name: IANMICHAEL AMESCUA MRN: [id number] Date of Birth: [birth year]  Transition of Care [facility]) CM/SW Contact:  Joanne Chars, LCSW Phone Number: 01/15/2022, 11:30 AM   Clinical Narrative:   Pt discharging to [facility].  RN call report to [phone].     Final next level of care: [place] Barriers to Discharge: Barriers Resolved   Patient Goals and CMS Choice        Discharge Placement              Patient chooses bed at:  [facility]) Patient to be transferred to facility by: [place] Name of family member notified: Grier Rocher, [facility] group home in room Patient and family notified of of transfer: 01/15/22  Discharge Plan and Services In-house Referral: Clinical Social Work   Post Acute Care Choice:  (TBD)                               Social Determinants of Health (SDOH) Interventions     Readmission Risk Interventions     No data to display

## 2022-01-15 NOTE — Progress Notes (Signed)
Physical Therapy Treatment Patient Details Name: Mitchell [name] MRN: [id number] DOB: 1960-08-14 Today's Date: 01/15/2022   History of Present Illness 61 y/o male presented to ED on 01/10/22 for AMS after being discharged from [facility] on 10/14. Admitted for hypovolemic/septic shock 2/2 PNA. PMH: bipolar disorder, schizophrenia, h/o lithium toxicity, HTN, DM2, OSA, CVA with residual L arm weakness.    PT Comments    Patient making progress towards physical therapy goals. Patient able to ambulate 20' x 2 with RW and minA+chair follow. Requires increased time to process commands throughout. More alert and conversating at end of session compared to beginning of session. Continue to recommend SNF for ongoing Physical Therapy.       Recommendations for follow up therapy are one component of a multi-disciplinary discharge planning process, led by the attending physician.  Recommendations may be updated based on patient status, additional functional criteria and insurance authorization.  Follow Up Recommendations  Skilled nursing-short term rehab (<3 hours/day) Can patient physically be transported by private vehicle: Yes   Assistance Recommended at Discharge Frequent or constant Supervision/Assistance  Patient can return home with the following A little help with walking and/or transfers;A little help with bathing/dressing/bathroom;Assistance with cooking/housework;Direct supervision/assist for medications management;Direct supervision/assist for financial management;Assist for transportation;Help with stairs or ramp for entrance   Equipment Recommendations  None recommended by PT    Recommendations for Other Services       Precautions / Restrictions Precautions Precautions: Fall Restrictions Weight Bearing Restrictions: No     Mobility  Bed Mobility Overal bed mobility: Needs Assistance Bed Mobility: Supine to Sit     Supine to sit: Mod assist     General bed mobility comments: assist  required for L LE management and trunk elevation. Cues for hand placement on bed rail to assist    Transfers Overall transfer level: Needs assistance Equipment used: Rolling Hazell Siwik (2 wheels) Transfers: Sit to/from Stand Sit to Stand: Mod assist, From elevated surface, Min assist           General transfer comment: modA to stand from EOB but able to stand from recliner with minA. Cues for hand placement on bed and recliner    Ambulation/Gait Ambulation/Gait assistance: Min assist, +2 safety/equipment Gait Distance (Feet): 20 Feet (+20') Assistive device: Rolling Svea Pusch (2 wheels) Gait Pattern/deviations: Step-through pattern, Decreased step length - left, Decreased stride length, Shuffle Gait velocity: decreased     General Gait Details: cues for increasing step length with intermittent follow through. Assist for balance and RW management. +chair follow   Stairs             Wheelchair Mobility    Modified Rankin (Stroke Patients Only)       Balance Overall balance assessment: Needs assistance Sitting-balance support: No upper extremity supported, Feet supported Sitting balance-Leahy Scale: Poor Sitting balance - Comments: posterior lean requiring cues to correct   Standing balance support: Bilateral upper extremity supported, During functional activity, Reliant on assistive device for balance Standing balance-Leahy Scale: Poor Standing balance comment: reliant on RW and assist                            Cognition Arousal/Alertness: Awake/alert Behavior During Therapy: Flat affect Overall Cognitive Status: No family/caregiver present to determine baseline cognitive functioning Area of Impairment: Problem solving, Following commands, Orientation, Attention                 Orientation Level: Time, Situation Current  Attention Level: Sustained   Following Commands: Follows one step commands with increased time Safety/Judgement: Decreased  awareness of deficits, Decreased awareness of safety Awareness: Intellectual Problem Solving: Slow processing, Decreased initiation, Difficulty sequencing, Requires verbal cues General Comments: increased response time required at times        Exercises      General Comments        Pertinent Vitals/Pain Pain Assessment Pain Assessment: Faces Faces Pain Scale: No hurt Pain Intervention(s): Monitored during session    Home Living                          Prior Function            PT Goals (current goals can now be found in the care plan section) Acute Rehab PT Goals PT Goal Formulation: Patient unable to participate in goal setting Time For Goal Achievement: 01/27/22 Potential to Achieve Goals: Good Progress towards PT goals: Progressing toward goals    Frequency    Min 2X/week      PT Plan Current plan remains appropriate    Co-evaluation              AM-PAC PT "6 Clicks" Mobility   Outcome Measure  Help needed turning from your back to your side while in a flat bed without using bedrails?: A Lot Help needed moving from lying on your back to sitting on the side of a flat bed without using bedrails?: A Lot Help needed moving to and from a bed to a chair (including a wheelchair)?: A Lot Help needed standing up from a chair using your arms (e.g., wheelchair or bedside chair)?: A Lot Help needed to walk in hospital room?: Total Help needed climbing 3-5 steps with a railing? : Total 6 Click Score: 10    End of Session Equipment Utilized During Treatment: Gait belt Activity Tolerance: Patient tolerated treatment well Patient left: in chair;with call bell/phone within reach;with chair alarm set Nurse Communication: Mobility status PT Visit Diagnosis: Unsteadiness on feet (R26.81);Other abnormalities of gait and mobility (R26.89);Muscle weakness (generalized) (M62.81);Difficulty in walking, not elsewhere classified (R26.2);Other symptoms and signs  involving the nervous system (R29.898)     Time: 1610-9604 PT Time Calculation (min) (ACUTE ONLY): 24 min  Charges:  $Gait Training: 23-37 mins                     Raelie Lohr A. Dan Humphreys PT, DPT Acute Rehabilitation Services Office [phone]    Viviann Spare 01/15/2022, 1:16 PM

## 2022-01-15 NOTE — Progress Notes (Signed)
Patient discharged at 1245. BP 126/87, temp 98.8, HR 92, RR 17, an O2 100% RA. All discharge instructions explained and medications given to patient. [facility] transported patient to [facility]. Report and discharge instructions given to [facility], RN.

## 2022-01-15 NOTE — Discharge Instructions (Addendum)
Mitchell [name], it has been a pleasure caring for you and I am so happy to see you are doing well!   You were hospitalized for hypotension and treated for hospital-acquired pneumonia. Since then you have recovered, you are no longer requiring antibiotics.  We also continued a lot of your home medications to manage your blood pressure, diabetes, and schizophrenia. In addition to medications, you were also seen by our pulmonary and critical care doctors.   When you are discharged we would like you to do the following:  1. You were started on the following medications in the hospital. Please continue them using these following instructions: -please continue your nystatin for 2 days for oral candidiasis   2.  Some of your medication regimen has been changed.  Please see the following instructions for the changes: -your haldol was decreased to 5mg  nightly  -your amlodipine and atenolol were discontinued at time of discharge  -your lantus was decreased at time of discharge   3. Continue taking the rest of the medications as you were before you came to the hospital.  4. Follow-up with your psychiatrist   Take care!  Internal Medicine Teaching Service

## 2022-01-15 NOTE — TOC Progression Note (Signed)
Transition of Care [facility]) - Progression Note    Patient Details  Name: VASH QUEZADA MRN: [id number] Date of Birth: 1960-11-20  Transition of Care [facility]) CM/SW Contact  Joanne Chars, LCSW Phone Number: 01/15/2022, 10:01 AM  Clinical Narrative:  CSW spoke with Anitha from group home.  She did not come by the hospital yesterday, is planning to come by today.  She did talk to MD yesterday and had questions answered.  She remains worried about whether pt will make full recovery with his ambulation.  She is agreeable to following up with SNF to continue to assess.  CSW spoke with [facility], discussed pt being from group home, contact with Anitha, and Anitha's concerns.  She is OK to follow up with group home.    Plan for pt to DC to [facility] today.      Expected Discharge Plan: Group Home Barriers to Discharge: Continued Medical Work up  Expected Discharge Plan and Services Expected Discharge Plan: Group Home In-house Referral: Clinical Social Work   Post Acute Care Choice:  (TBD) Living arrangements for the past 2 months: Group Home                                       Social Determinants of Health (SDOH) Interventions    Readmission Risk Interventions     No data to display

## 2022-01-15 NOTE — Plan of Care (Signed)
Problem: Education: Goal: Ability to describe self-care measures that may prevent or decrease complications (Diabetes Survival Skills Education) will improve 01/15/2022 1225 by Cy Blamer, LPN Outcome: Adequate for Discharge 01/15/2022 1225 by Cy Blamer, LPN Outcome: Progressing Goal: Individualized Educational Video(s) 01/15/2022 1225 by Cy Blamer, LPN Outcome: Adequate for Discharge 01/15/2022 1225 by Cy Blamer, LPN Outcome: Progressing   Problem: Coping: Goal: Ability to adjust to condition or change in health will improve 01/15/2022 1225 by Cy Blamer, LPN Outcome: Adequate for Discharge 01/15/2022 1225 by Cy Blamer, LPN Outcome: Progressing   Problem: Fluid Volume: Goal: Ability to maintain a balanced intake and output will improve 01/15/2022 1225 by Cy Blamer, LPN Outcome: Adequate for Discharge 01/15/2022 1225 by Cy Blamer, LPN Outcome: Progressing   Problem: Health Behavior/Discharge Planning: Goal: Ability to identify and utilize available resources and services will improve 01/15/2022 1225 by Cy Blamer, LPN Outcome: Adequate for Discharge 01/15/2022 1225 by Cy Blamer, LPN Outcome: Progressing Goal: Ability to manage health-related needs will improve 01/15/2022 1225 by Cy Blamer, LPN Outcome: Adequate for Discharge 01/15/2022 1225 by Cy Blamer, LPN Outcome: Progressing   Problem: Metabolic: Goal: Ability to maintain appropriate glucose levels will improve 01/15/2022 1225 by Cy Blamer, LPN Outcome: Adequate for Discharge 01/15/2022 1225 by Cy Blamer, LPN Outcome: Progressing   Problem: Nutritional: Goal: Maintenance of adequate nutrition will improve 01/15/2022 1225 by Cy Blamer, LPN Outcome: Adequate for Discharge 01/15/2022 1225 by Cy Blamer, LPN Outcome: Progressing Goal: Progress toward achieving  an optimal weight will improve 01/15/2022 1225 by Cy Blamer, LPN Outcome: Adequate for Discharge 01/15/2022 1225 by Cy Blamer, LPN Outcome: Progressing   Problem: Skin Integrity: Goal: Risk for impaired skin integrity will decrease 01/15/2022 1225 by Cy Blamer, LPN Outcome: Adequate for Discharge 01/15/2022 1225 by Cy Blamer, LPN Outcome: Progressing   Problem: Tissue Perfusion: Goal: Adequacy of tissue perfusion will improve 01/15/2022 1225 by Cy Blamer, LPN Outcome: Adequate for Discharge 01/15/2022 1225 by Cy Blamer, LPN Outcome: Progressing   Problem: Education: Goal: Knowledge of General Education information will improve Description: Including pain rating scale, medication(s)/side effects and non-pharmacologic comfort measures 01/15/2022 1225 by Cy Blamer, LPN Outcome: Adequate for Discharge 01/15/2022 1225 by Cy Blamer, LPN Outcome: Progressing   Problem: Health Behavior/Discharge Planning: Goal: Ability to manage health-related needs will improve 01/15/2022 1225 by Cy Blamer, LPN Outcome: Adequate for Discharge 01/15/2022 1225 by Cy Blamer, LPN Outcome: Progressing   Problem: Clinical Measurements: Goal: Ability to maintain clinical measurements within normal limits will improve 01/15/2022 1225 by Cy Blamer, LPN Outcome: Adequate for Discharge 01/15/2022 1225 by Cy Blamer, LPN Outcome: Progressing Goal: Will remain free from infection 01/15/2022 1225 by Cy Blamer, LPN Outcome: Adequate for Discharge 01/15/2022 1225 by Cy Blamer, LPN Outcome: Progressing Goal: Diagnostic test results will improve 01/15/2022 1225 by Cy Blamer, LPN Outcome: Adequate for Discharge 01/15/2022 1225 by Cy Blamer, LPN Outcome: Progressing Goal: Respiratory complications will improve 01/15/2022 1225 by Cy Blamer,  LPN Outcome: Adequate for Discharge 01/15/2022 1225 by Cy Blamer, LPN Outcome: Progressing Goal: Cardiovascular complication will be avoided 01/15/2022 1225 by Cy Blamer, LPN Outcome: Adequate for Discharge 01/15/2022 1225 by Cy Blamer, LPN Outcome: Progressing   Problem: Activity: Goal: Risk for activity intolerance will decrease 01/15/2022 1225 by Cy Blamer, LPN Outcome: Adequate for Discharge  01/15/2022 1225 by Kieth Brightly, LPN Outcome: Progressing   Problem: Nutrition: Goal: Adequate nutrition will be maintained 01/15/2022 1225 by Kieth Brightly, LPN Outcome: Adequate for Discharge 01/15/2022 1225 by Kieth Brightly, LPN Outcome: Progressing   Problem: Coping: Goal: Level of anxiety will decrease 01/15/2022 1225 by Kieth Brightly, LPN Outcome: Adequate for Discharge 01/15/2022 1225 by Kieth Brightly, LPN Outcome: Progressing   Problem: Elimination: Goal: Will not experience complications related to bowel motility 01/15/2022 1225 by Kieth Brightly, LPN Outcome: Adequate for Discharge 01/15/2022 1225 by Kieth Brightly, LPN Outcome: Progressing Goal: Will not experience complications related to urinary retention 01/15/2022 1225 by Kieth Brightly, LPN Outcome: Adequate for Discharge 01/15/2022 1225 by Kieth Brightly, LPN Outcome: Progressing   Problem: Pain Managment: Goal: General experience of comfort will improve 01/15/2022 1225 by Kieth Brightly, LPN Outcome: Adequate for Discharge 01/15/2022 1225 by Kieth Brightly, LPN Outcome: Progressing   Problem: Safety: Goal: Ability to remain free from injury will improve 01/15/2022 1225 by Kieth Brightly, LPN Outcome: Adequate for Discharge 01/15/2022 1225 by Kieth Brightly, LPN Outcome: Progressing   Problem: Skin Integrity: Goal: Risk for impaired skin integrity will decrease 01/15/2022 1225 by Kieth Brightly, LPN Outcome: Adequate for Discharge 01/15/2022 1225 by Kieth Brightly, LPN Outcome: Progressing

## 2022-01-31 ENCOUNTER — Encounter (HOSPITAL_COMMUNITY): Payer: Self-pay

## 2022-01-31 ENCOUNTER — Emergency Department (HOSPITAL_COMMUNITY): Payer: Medicare Other

## 2022-01-31 ENCOUNTER — Other Ambulatory Visit: Payer: Self-pay

## 2022-01-31 ENCOUNTER — Emergency Department (HOSPITAL_COMMUNITY)
Admission: EM | Admit: 2022-01-31 | Discharge: 2022-01-31 | Discharge status: Against Medical Advice | Payer: Medicare Other | Attending: Emergency Medicine | Admitting: Emergency Medicine

## 2022-01-31 DIAGNOSIS — R079 Chest pain, unspecified: Secondary | ICD-10-CM | POA: Insufficient documentation

## 2022-01-31 DIAGNOSIS — E119 Type 2 diabetes mellitus without complications: Secondary | ICD-10-CM | POA: Insufficient documentation

## 2022-01-31 DIAGNOSIS — Z5321 Procedure and treatment not carried out due to patient leaving prior to being seen by health care provider: Secondary | ICD-10-CM | POA: Insufficient documentation

## 2022-01-31 LAB — BASIC METABOLIC PANEL
Anion gap: 17 — ABNORMAL HIGH (ref 5–15)
BUN: 14 mg/dL (ref 6–20)
CO2: 21 mmol/L — ABNORMAL LOW (ref 22–32)
Calcium: 9.3 mg/dL (ref 8.9–10.3)
Chloride: 94 mmol/L — ABNORMAL LOW (ref 98–111)
Creatinine, Ser: 1.24 mg/dL (ref 0.61–1.24)
GFR, Estimated: >60 mL/min (ref >60)
Glucose, Bld: 390 mg/dL — ABNORMAL HIGH (ref 70–99)
Potassium: 4.1 mmol/L (ref 3.5–5.1)
Sodium: 132 mmol/L — ABNORMAL LOW (ref 135–145)

## 2022-01-31 LAB — CBC
HCT: 36.4 % — ABNORMAL LOW (ref 39.0–52.0)
Hemoglobin: 12 g/dL — ABNORMAL LOW (ref 13.0–17.0)
MCH: 30.5 pg (ref 26.0–34.0)
MCHC: 33 g/dL (ref 30.0–36.0)
MCV: 92.6 fL (ref 80.0–100.0)
Platelets: 244 10*3/uL (ref 150–400)
RBC: 3.93 MIL/uL — ABNORMAL LOW (ref 4.22–5.81)
RDW: 14 % (ref 11.5–15.5)
WBC: 5.5 10*3/uL (ref 4.0–10.5)
nRBC: 0 % (ref 0.0–0.2)

## 2022-01-31 LAB — TROPONIN I (HIGH SENSITIVITY)
Troponin I (High Sensitivity): 3 ng/L (ref <18)
Troponin I (High Sensitivity): 3 ng/L (ref <18)

## 2022-01-31 NOTE — ED Provider Triage Note (Signed)
Emergency Medicine Provider Triage Evaluation Note  Mitchell [name] , a 61 y.o. male history includes schizophrenia, diabetes was evaluated in triage.  Pt complains of chest pain onset around 2 AM this morning, no clear inciting event describes it as a sharp pain that does not radiate it has been constant but gradually improving throughout the day, no medication prior to arrival.  Denies any additional concerns  Review of Systems  Positive: Chest pain Negative: Nausea vomiting, swelling, fall, injury, fever, chills or any additional concerns  Physical Exam  BP (!) 160/87 (BP Location: Right Arm)   Pulse 95   Temp 98.4 F (36.9 C) (Oral)   Resp 15   Ht 5\' 4"  (1.626 m)   Wt 95.3 kg   SpO2 98%   BMI 36.05 kg/m  Gen:   Awake, no distress   Resp:  Normal effort, lungs clear to auscultation bilaterally MSK:   Moves extremities without difficulty no lower extremity edema.  Pedal pulses intact and equal. Other:  Heart regular rate and rhythm  Medical Decision Making  Medically screening exam initiated at 11:12 AM.  Appropriate orders placed.  [name] was informed that the remainder of the evaluation will be completed by another provider, this initial triage assessment does not replace that evaluation, and the importance of remaining in the ED until their evaluation is complete.   Note: Portions of this report may have been transcribed using voice recognition software. Every effort was made to ensure accuracy; however, inadvertent computerized transcription errors may still be present.    Deliah Boston, PA-C 01/31/22 1114

## 2022-01-31 NOTE — ED Triage Notes (Signed)
Pt BIB [facility] from home c/o centralized CP that is not radiating that started this morning. Has improved some but still there. Pt has a hx of diabetes and his CBG was 450 with EMS but pt stated he did just finish eating not too long ago.

## 2022-02-02 ENCOUNTER — Other Ambulatory Visit: Payer: Self-pay

## 2022-02-02 ENCOUNTER — Emergency Department (HOSPITAL_COMMUNITY)
Admission: EM | Admit: 2022-02-02 | Discharge: 2022-02-02 | Disposition: A | Discharge status: Home / Self Care | Payer: Medicare Other | Attending: Emergency Medicine | Admitting: Emergency Medicine

## 2022-02-02 ENCOUNTER — Emergency Department (HOSPITAL_COMMUNITY): Payer: Medicare Other

## 2022-02-02 ENCOUNTER — Encounter (HOSPITAL_COMMUNITY): Payer: Self-pay | Admitting: Emergency Medicine

## 2022-02-02 DIAGNOSIS — E119 Type 2 diabetes mellitus without complications: Secondary | ICD-10-CM | POA: Insufficient documentation

## 2022-02-02 DIAGNOSIS — Z7984 Long term (current) use of oral hypoglycemic drugs: Secondary | ICD-10-CM | POA: Diagnosis not present

## 2022-02-02 DIAGNOSIS — R739 Hyperglycemia, unspecified: Secondary | ICD-10-CM

## 2022-02-02 DIAGNOSIS — M791 Myalgia, unspecified site: Secondary | ICD-10-CM | POA: Diagnosis not present

## 2022-02-02 DIAGNOSIS — Z79899 Other long term (current) drug therapy: Secondary | ICD-10-CM | POA: Insufficient documentation

## 2022-02-02 DIAGNOSIS — R0981 Nasal congestion: Secondary | ICD-10-CM | POA: Insufficient documentation

## 2022-02-02 DIAGNOSIS — F2089 Other schizophrenia: Secondary | ICD-10-CM

## 2022-02-02 DIAGNOSIS — I10 Essential (primary) hypertension: Secondary | ICD-10-CM | POA: Diagnosis not present

## 2022-02-02 DIAGNOSIS — I6782 Cerebral ischemia: Secondary | ICD-10-CM | POA: Diagnosis not present

## 2022-02-02 DIAGNOSIS — Z7982 Long term (current) use of aspirin: Secondary | ICD-10-CM | POA: Diagnosis not present

## 2022-02-02 DIAGNOSIS — M79604 Pain in right leg: Secondary | ICD-10-CM | POA: Diagnosis present

## 2022-02-02 DIAGNOSIS — M79605 Pain in left leg: Secondary | ICD-10-CM | POA: Diagnosis not present

## 2022-02-02 DIAGNOSIS — M545 Low back pain, unspecified: Secondary | ICD-10-CM | POA: Insufficient documentation

## 2022-02-02 LAB — BASIC METABOLIC PANEL
Anion gap: 11 (ref 5–15)
BUN: 18 mg/dL (ref 6–20)
CO2: 23 mmol/L (ref 22–32)
Calcium: 9.5 mg/dL (ref 8.9–10.3)
Chloride: 100 mmol/L (ref 98–111)
Creatinine, Ser: 1.4 mg/dL — ABNORMAL HIGH (ref 0.61–1.24)
GFR, Estimated: 58 mL/min — ABNORMAL LOW (ref >60)
Glucose, Bld: 344 mg/dL — ABNORMAL HIGH (ref 70–99)
Potassium: 4.6 mmol/L (ref 3.5–5.1)
Sodium: 134 mmol/L — ABNORMAL LOW (ref 135–145)

## 2022-02-02 LAB — CBC WITH DIFFERENTIAL/PLATELET
Abs Immature Granulocytes: 0.03 10*3/uL (ref 0.00–0.07)
Basophils Absolute: 0 10*3/uL (ref 0.0–0.1)
Basophils Relative: 0 %
Eosinophils Absolute: 0.2 10*3/uL (ref 0.0–0.5)
Eosinophils Relative: 2 %
HCT: 40.9 % (ref 39.0–52.0)
Hemoglobin: 13.7 g/dL (ref 13.0–17.0)
Immature Granulocytes: 0 %
Lymphocytes Relative: 29 %
Lymphs Abs: 2.3 10*3/uL (ref 0.7–4.0)
MCH: 30.8 pg (ref 26.0–34.0)
MCHC: 33.5 g/dL (ref 30.0–36.0)
MCV: 91.9 fL (ref 80.0–100.0)
Monocytes Absolute: 1.3 10*3/uL — ABNORMAL HIGH (ref 0.1–1.0)
Monocytes Relative: 17 %
Neutro Abs: 4 10*3/uL (ref 1.7–7.7)
Neutrophils Relative %: 52 %
Platelets: 242 10*3/uL (ref 150–400)
RBC: 4.45 MIL/uL (ref 4.22–5.81)
RDW: 14.5 % (ref 11.5–15.5)
WBC: 7.7 10*3/uL (ref 4.0–10.5)
nRBC: 0 % (ref 0.0–0.2)

## 2022-02-02 LAB — CBG MONITORING, ED
Glucose-Capillary: 334 mg/dL — ABNORMAL HIGH (ref 70–99)
Glucose-Capillary: 304 mg/dL — ABNORMAL HIGH (ref 70–99)

## 2022-02-02 MED ORDER — TRAMADOL HCL 50 MG PO TABS
50.0000 mg | ORAL_TABLET | Freq: Once | ORAL | Status: AC
  Administered 2022-02-02: 50 mg via ORAL
  Filled 2022-02-02: qty 1

## 2022-02-02 MED ORDER — ACETAMINOPHEN 500 MG PO TABS
1000.0000 mg | ORAL_TABLET | Freq: Once | ORAL | Status: AC
  Administered 2022-02-02: 1000 mg via ORAL
  Filled 2022-02-02: qty 2

## 2022-02-02 MED ORDER — SODIUM CHLORIDE 0.9 % IV BOLUS
1000.0000 mL | Freq: Once | INTRAVENOUS | Status: AC
  Administered 2022-02-02: 1000 mL via INTRAVENOUS

## 2022-02-02 MED ORDER — INSULIN ASPART 100 UNIT/ML IJ SOLN
8.0000 [IU] | Freq: Once | INTRAMUSCULAR | Status: AC
  Administered 2022-02-02: 8 [IU] via SUBCUTANEOUS

## 2022-02-02 MED ORDER — IBUPROFEN 400 MG PO TABS: 600.0000 mg | ORAL_TABLET | Freq: Once | ORAL | Status: DC

## 2022-02-02 NOTE — Inpatient Diabetes Management (Signed)
Inpatient Diabetes Program Recommendations  AACE/ADA: New Consensus Statement on Inpatient Glycemic Control   Target Ranges:  Prepandial:   less than 140 mg/dL      Peak postprandial:   less than 180 mg/dL (1-2 hours)      Critically ill patients:  140 - 180 mg/dL    Latest Reference Range & Units 02/02/22 03:10  Glucose 70 - 99 mg/dL 344 (H)   Review of Glycemic Control  Diabetes history: DM2 Outpatient Diabetes medications: Lantus 15 units QHS, Metformin 1000 mg BID Current orders for Inpatient glycemic control: None; in ED  Inpatient Diabetes Program Recommendations:    Insulin: If admitted please use Glycemic Control order set to order Semglee 10 units Q24H, CBGs AC&HS, Novolog 0-15 units TID with meals, Novolog 0-5 units QHS, and Novolog 3 units TID with meals for meal coverage if patient eats at least 50% of meals.  Thanks, Barnie Alderman, RN, MSN, [place] [facility] Coordinator Inpatient Diabetes Program [phone] (Team Pager from 8am to [place])

## 2022-02-02 NOTE — ED Triage Notes (Signed)
Patient here with leg pain, headache and "thinking out of my mind".  Leg pain is non specific, headache has been going on for one day.  Patient has extensive psych history.  Patient asked to go to [facility] and was refused due to his hypertension and headache complaint.

## 2022-02-02 NOTE — ED Provider Triage Note (Signed)
Emergency Medicine Provider Triage Evaluation Note  Mitchell [name] , a 61 y.o. male  was evaluated in triage.  Pt complains of headache. States headache has been ongoing for weeks. Also mentioned to triage RN he was "thinking out of his mind."  Was going to [facility] but they refused him due to headache and his high blood pressure. Review of Systems  Positive: See above Negative:   Physical Exam  BP (!) 175/108   Pulse (!) 107   Temp 98.3 F (36.8 C) (Oral)   Resp 18   SpO2 97%  Gen:   Awake, no distress   Resp:  Normal effort  MSK:   Moves extremities without difficulty  Other:  Non focal neurological exam   Medical Decision Making  Medically screening exam initiated at 2:53 AM.  Appropriate orders placed.  [name] was informed that the remainder of the evaluation will be completed by another provider, this initial triage assessment does not replace that evaluation, and the importance of remaining in the ED until their evaluation is complete.     Mickie Hillier, PA-C 02/02/22 [phone]

## 2022-02-02 NOTE — ED Provider Notes (Signed)
[facility] EMERGENCY DEPARTMENT Provider Note   CSN: [id number] Arrival date & time: 02/02/22  0220     History  Chief Complaint  Patient presents with   Leg Pain    Mitchell [name] is a 61 y.o. male.  Pt w hx dm, htn, c/o several symptoms. Notes low back pain in past couple weeks. Notes hx same, denies specific injury, trauma or fall. No radicular pain. No saddle area or leg numbness. No weakness. No problems w normal bowel/bladder function. Also c/o bilateral leg pain, dull, esp in knees/hips and ankles. No new swelling to legs. No unilateral leg pain. No numbness/weakness. Also c/o dull frontal headache in past week, moderate, dull, persistent. Notes occasional headaches in past but not for this long. No eye pain or change in vision. No change in speech. No change in balance, coordination or normal functional ability. No sinus pain. +upper resp/nasal congestion, body aches. No fever. No cough or sob. No sore throat or trouble swallowing. Denies neck pain or stiffness. Also concerned bp may be high, hx same. No cp or sob. No new swelling. Compliant w home meds.      The history is provided by the patient and medical records. The history is limited by the condition of the patient.  Leg Pain Associated symptoms: back pain   Associated symptoms: no fever and no neck pain        Home Medications Prior to Admission medications   Medication Sig Start Date End Date Taking? Authorizing Provider  aspirin EC 81 MG EC tablet Take 1 tablet (81 mg total) by mouth daily. Swallow whole. 05/28/21   Briant Cedar, MD  cloNIDine (CATAPRES) 0.2 MG tablet Take 0.2 mg by mouth 2 (two) times daily.    [provider]  cloZAPine (CLOZARIL) 25 MG tablet Take 3 tablets (75 mg total) by mouth at bedtime. Patient taking differently: Take 25-75 mg by mouth See admin instructions. Taking 25 mg in the AM and 75 mg at bedtime 05/27/21 01/06/22  Briant Cedar, MD   clozapine (CLOZARIL) 50 MG tablet Take 0.5 tablets (25 mg total) by mouth in the morning. 01/08/22 02/07/22  Rolanda Lundborg, MD  donepezil (ARICEPT) 5 MG tablet Take 1 tablet (5 mg total) by mouth at bedtime. 05/27/21 01/10/22  Briant Cedar, MD  haloperidol (HALDOL) 5 MG tablet Take 1 tablet (5 mg total) by mouth at bedtime. Patient taking differently: Take 5 mg by mouth 2 (two) times daily. 01/08/22 02/07/22  Rolanda Lundborg, MD  insulin glargine (LANTUS) 100 UNIT/ML injection Inject 0.15 mLs (15 Units total) into the skin at bedtime. 01/15/22   Rolanda Lundborg, MD  metFORMIN (GLUCOPHAGE) 1000 MG tablet Take 1,000 mg by mouth 2 (two) times daily. 05/11/21   [provider]  senna (SENOKOT) 8.6 MG TABS tablet Take 2 tablets (17.2 mg total) by mouth daily. 05/28/21   Briant Cedar, MD      Allergies    Patient has no known allergies.    Review of Systems   Review of Systems  Constitutional:  Negative for chills and fever.  HENT:  Positive for congestion. Negative for sore throat.   Eyes:  Negative for pain and visual disturbance.  Respiratory:  Negative for shortness of breath.   Cardiovascular:  Negative for chest pain, palpitations and leg swelling.  Gastrointestinal:  Negative for abdominal pain, diarrhea and vomiting.  Genitourinary:  Negative for dysuria and flank pain.  Musculoskeletal:  Positive for arthralgias,  back pain and myalgias. Negative for neck pain.  Skin:  Negative for rash.  Neurological:  Positive for headaches. Negative for speech difficulty, weakness and numbness.  Hematological:  Does not bruise/bleed easily.  Psychiatric/Behavioral:  Negative for confusion.     Physical Exam Updated Vital Signs BP (!) 177/76 (BP Location: Left Arm)   Pulse 100   Temp 98.5 F (36.9 C) (Oral)   Resp 17   SpO2 96%  Physical Exam Vitals and nursing note reviewed.  Constitutional:      Appearance: Normal appearance. He is well-developed.  HENT:      Head: Atraumatic.     Comments: No sinus or  temporal tenderness.     Nose: Nose normal.     Mouth/Throat:     Mouth: Mucous membranes are moist.     Pharynx: Oropharynx is clear.  Eyes:     General: No scleral icterus.    Extraocular Movements: Extraocular movements intact.     Conjunctiva/sclera: Conjunctivae normal.     Pupils: Pupils are equal, round, and reactive to light.  Neck:     Trachea: No tracheal deviation.     Comments: No stiffness or rigidity. Trachea midline. Thyroid not grossly enlarged or tender.  Cardiovascular:     Rate and Rhythm: Normal rate and regular rhythm.     Pulses: Normal pulses.     Heart sounds: Normal heart sounds. No murmur heard.    No friction rub. No gallop.  Pulmonary:     Effort: Pulmonary effort is normal. No accessory muscle usage or respiratory distress.     Breath sounds: Normal breath sounds.  Abdominal:     General: Bowel sounds are normal. There is no distension.     Palpations: Abdomen is soft.     Tenderness: There is no abdominal tenderness. There is no guarding.  Genitourinary:    Comments: No cva tenderness. Musculoskeletal:        General: No swelling.     Cervical back: Normal range of motion and neck supple. No rigidity.     Comments: CTLS spine, non tender, aligned, no step off. Lumbar muscular tenderness. Good rom bil lower extremities without pain or focal bony tenderness. Distal pulses palp. No asymmetric swelling or leg edema.   Skin:    General: Skin is warm and dry.     Findings: No rash.  Neurological:     Mental Status: He is alert.     Comments: Alert, speech clear. Motor/sens grossly intact bil. Steady gait.   Psychiatric:        Mood and Affect: Mood normal.     ED Results / Procedures / Treatments   Labs (all labs ordered are listed, but only abnormal results are displayed) Results for orders placed or performed during the hospital encounter of 02/02/22  Basic metabolic panel  Result Value Ref Range    Sodium 134 (L) 135 - 145 mmol/L   Potassium 4.6 3.5 - 5.1 mmol/L   Chloride 100 98 - 111 mmol/L   CO2 23 22 - 32 mmol/L   Glucose, Bld 344 (H) 70 - 99 mg/dL   BUN 18 6 - 20 mg/dL   Creatinine, Ser 4.69 (H) 0.61 - 1.24 mg/dL   Calcium 9.5 8.9 - 62.9 mg/dL   GFR, Estimated 58 (L) >60 mL/min   Anion gap 11 5 - 15  CBC with Differential  Result Value Ref Range   WBC 7.7 4.0 - 10.5 K/uL   RBC 4.45 4.22 -  5.81 MIL/uL   Hemoglobin 13.7 13.0 - 17.0 g/dL   HCT 29.9 24.2 - 68.3 %   MCV 91.9 80.0 - 100.0 fL   MCH 30.8 26.0 - 34.0 pg   MCHC 33.5 30.0 - 36.0 g/dL   RDW 41.9 62.2 - 29.7 %   Platelets 242 150 - 400 K/uL   nRBC 0.0 0.0 - 0.2 %   Neutrophils Relative % 52 %   Neutro Abs 4.0 1.7 - 7.7 K/uL   Lymphocytes Relative 29 %   Lymphs Abs 2.3 0.7 - 4.0 K/uL   Monocytes Relative 17 %   Monocytes Absolute 1.3 (H) 0.1 - 1.0 K/uL   Eosinophils Relative 2 %   Eosinophils Absolute 0.2 0.0 - 0.5 K/uL   Basophils Relative 0 %   Basophils Absolute 0.0 0.0 - 0.1 K/uL   Immature Granulocytes 0 %   Abs Immature Granulocytes 0.03 0.00 - 0.07 K/uL  CBG monitoring, ED  Result Value Ref Range   Glucose-Capillary 334 (H) 70 - 99 mg/dL      EKG EKG Interpretation  Date/Time:  Tuesday February 02 2022 15:45:08 EST Ventricular Rate:  100 PR Interval:  148 QRS Duration: 82 QT Interval:  356 QTC Calculation: 459 R Axis:   47 Text Interpretation: Sinus rhythm with Premature atrial complexes Low voltage QRS No significant change since last tracing Confirmed by Cathren Laine ([id number]) on 02/02/2022 3:47:15 PM  Radiology CT Head Wo Contrast  Result Date: 02/02/2022 CLINICAL DATA:  Headache, new or worsening (Age >= 50y) EXAM: CT HEAD WITHOUT CONTRAST TECHNIQUE: Contiguous axial images were obtained from the base of the skull through the vertex without intravenous contrast. RADIATION DOSE REDUCTION: This exam was performed according to the departmental dose-optimization program which includes  automated exposure control, adjustment of the mA and/or kV according to patient size and/or use of iterative reconstruction technique. COMPARISON:  CT head January 10, 2022. FINDINGS: Brain: No evidence of acute infarction, hemorrhage, hydrocephalus, extra-axial collection or mass lesion/mass effect. Mild patchy white matter hypodensities, nonspecific but compatible with chronic microvascular ischemic disease. Vascular: No hyperdense vessel identified. Skull: No acute fracture. Sinuses/Orbits: Paranasal sinus mucosal thickening, greatest and moderate in the right sphenoid sinus. No acute orbital findings. Other: No mastoid effusions. IMPRESSION: No evidence of acute intracranial abnormality. Electronically Signed   By: Feliberto Harts M.D.   On: 02/02/2022 17:28    Procedures Procedures    Medications Ordered in ED Medications  ibuprofen (ADVIL) tablet 600 mg (has no administration in time range)  insulin aspart (novoLOG) injection 8 Units (has no administration in time range)  sodium chloride 0.9 % bolus 1,000 mL (has no administration in time range)  traMADol (ULTRAM) tablet 50 mg (has no administration in time range)    ED Course/ Medical Decision Making/ A&P                           Medical Decision Making Problems Addressed: Acute bilateral low back pain without sciatica: acute illness or injury Essential hypertension: chronic illness or injury with exacerbation, progression, or side effects of treatment that poses a threat to life or bodily functions Hyperglycemia: acute illness or injury 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 Labs: ordered. Decision-making  details documented in ED Course. Radiology: ordered and independent interpretation performed. Decision-making details documented in ED Course. ECG/medicine tests: ordered and independent interpretation performed. Decision-making details documented in ED Course.  Risk OTC drugs. Prescription drug management. Decision regarding hospitalization.   Iv ns. Continuous pulse ox and cardiac monitoring. Labs ordered/sent. Imaging ordered.   Diff dx includes msk pain, ich/sdh, dehydration, anemia, etc. -  dispo decision including potential need for admission considered if ct abn, or other acute process - will get labs and imaging and reassess.   Reviewed nursing notes and prior charts for additional history. External reports reviewed.   Cardiac monitor: sinus rhythm, rate 94.  Labs reviewed/interpreted by me - glucose elevated. Bicarb normal.   CT reviewed/interpreted by me - no hem.   Ultram po. Po fluids. Novolog sq.   Recheck, hr 88, rr 16, pulse ox 99%.  Tolerating po. No distress. No pain.   Pt currently appears stable for d/c.  Rec close pcp and bh f/u.  Return precautions provided.             Final Clinical Impression(s) / ED Diagnoses Final diagnoses:  None    Rx / DC Orders ED Discharge Orders     None         Cathren Laine, MD 02/02/22 1714    Cathren Laine, MD 02/04/22 1521

## 2022-02-02 NOTE — Discharge Instructions (Addendum)
It was our pleasure to provide your ER care today - we hope that you feel better.  Drink plenty of fluids/stay well hydrated. Take acetaminophen as need for pain.   Your blood pressure and blood sugar are high - continue your meds, limit salt intake, follow diabetes eating plan, and follow up closely with primary care doctor in the coming week.  Also follow up closely with your behavioral health provider in the coming week.  Return to ER if worse, new symptoms, fevers, chest pain, trouble breathing, or other concern.

## 2022-02-03 ENCOUNTER — Other Ambulatory Visit: Payer: Self-pay

## 2022-02-03 ENCOUNTER — Emergency Department (HOSPITAL_COMMUNITY)
Admission: EM | Admit: 2022-02-03 | Discharge: 2022-02-04 | Disposition: A | Discharge status: Home / Self Care | Payer: Medicare Other | Attending: Emergency Medicine | Admitting: Emergency Medicine

## 2022-02-03 ENCOUNTER — Encounter (HOSPITAL_COMMUNITY): Payer: Self-pay | Admitting: *Deleted

## 2022-02-03 DIAGNOSIS — M791 Myalgia, unspecified site: Secondary | ICD-10-CM | POA: Diagnosis present

## 2022-02-03 DIAGNOSIS — Z7984 Long term (current) use of oral hypoglycemic drugs: Secondary | ICD-10-CM | POA: Diagnosis not present

## 2022-02-03 DIAGNOSIS — I10 Essential (primary) hypertension: Secondary | ICD-10-CM | POA: Insufficient documentation

## 2022-02-03 DIAGNOSIS — G8929 Other chronic pain: Secondary | ICD-10-CM | POA: Insufficient documentation

## 2022-02-03 DIAGNOSIS — E119 Type 2 diabetes mellitus without complications: Secondary | ICD-10-CM | POA: Insufficient documentation

## 2022-02-03 DIAGNOSIS — Z7982 Long term (current) use of aspirin: Secondary | ICD-10-CM | POA: Diagnosis not present

## 2022-02-03 DIAGNOSIS — M545 Low back pain, unspecified: Secondary | ICD-10-CM | POA: Insufficient documentation

## 2022-02-03 DIAGNOSIS — Z794 Long term (current) use of insulin: Secondary | ICD-10-CM | POA: Diagnosis not present

## 2022-02-03 DIAGNOSIS — Z20822 Contact with and (suspected) exposure to covid-19: Secondary | ICD-10-CM | POA: Diagnosis not present

## 2022-02-03 LAB — CBC WITH DIFFERENTIAL/PLATELET
Abs Immature Granulocytes: 0.01 10*3/uL (ref 0.00–0.07)
Basophils Absolute: 0 10*3/uL (ref 0.0–0.1)
Basophils Relative: 1 %
Eosinophils Absolute: 0.2 10*3/uL (ref 0.0–0.5)
Eosinophils Relative: 3 %
HCT: 38.4 % — ABNORMAL LOW (ref 39.0–52.0)
Hemoglobin: 12.3 g/dL — ABNORMAL LOW (ref 13.0–17.0)
Immature Granulocytes: 0 %
Lymphocytes Relative: 32 %
Lymphs Abs: 2.1 10*3/uL (ref 0.7–4.0)
MCH: 30.4 pg (ref 26.0–34.0)
MCHC: 32 g/dL (ref 30.0–36.0)
MCV: 94.8 fL (ref 80.0–100.0)
Monocytes Absolute: 1 10*3/uL (ref 0.1–1.0)
Monocytes Relative: 15 %
Neutro Abs: 3.2 10*3/uL (ref 1.7–7.7)
Neutrophils Relative %: 49 %
Platelets: 256 10*3/uL (ref 150–400)
RBC: 4.05 MIL/uL — ABNORMAL LOW (ref 4.22–5.81)
RDW: 14.6 % (ref 11.5–15.5)
WBC: 6.5 10*3/uL (ref 4.0–10.5)
nRBC: 0 % (ref 0.0–0.2)

## 2022-02-03 LAB — COMPREHENSIVE METABOLIC PANEL
ALT: 21 U/L (ref 0–44)
AST: 31 U/L (ref 15–41)
Albumin: 4.1 g/dL (ref 3.5–5.0)
Alkaline Phosphatase: 102 U/L (ref 38–126)
Anion gap: 11 (ref 5–15)
BUN: 15 mg/dL (ref 6–20)
CO2: 23 mmol/L (ref 22–32)
Calcium: 9.4 mg/dL (ref 8.9–10.3)
Chloride: 106 mmol/L (ref 98–111)
Creatinine, Ser: 1.29 mg/dL — ABNORMAL HIGH (ref 0.61–1.24)
GFR, Estimated: >60 mL/min (ref >60)
Glucose, Bld: 308 mg/dL — ABNORMAL HIGH (ref 70–99)
Potassium: 4.9 mmol/L (ref 3.5–5.1)
Sodium: 140 mmol/L (ref 135–145)
Total Bilirubin: 0.9 mg/dL (ref 0.3–1.2)
Total Protein: 7.9 g/dL (ref 6.5–8.1)

## 2022-02-03 NOTE — ED Triage Notes (Signed)
The pt reports that he has been aching all over his body  since he fell one week ago and yesterday  he was seen here then and he has fallen again  he walked into the treatment area.

## 2022-02-03 NOTE — ED Provider Triage Note (Signed)
Emergency Medicine Provider Triage Evaluation Note  Mitchell [name] , a 61 y.o. male  was evaluated in triage.  Pt complains of generalized body aches including low back pain, bilateral leg pain along with runny nose.  Evaluated yesterday given a prescription for Ultram reports no improvement in symptoms..  Review of Systems  Positive: Body aches Negative: Chest pain, sob, fever  Physical Exam  BP (!) 152/85   Pulse (!) 103   Temp 98.1 F (36.7 C)   Resp 18   Ht 5\' 4"  (1.626 m)   Wt 95.3 kg   SpO2 98%   BMI 36.06 kg/m  Gen:   Awake, no distress   Resp:  Normal effort  MSK:   Moves extremities without difficulty  Other:    Medical Decision Making  Medically screening exam initiated at 6:38 PM.  Appropriate orders placed.  was informed that the remainder of the evaluation will be completed by another provider, this initial triage assessment does not replace that evaluation, and the importance of remaining in the ED until their evaluation is complete.     [name], PA-C 02/03/22 1838

## 2022-02-04 ENCOUNTER — Other Ambulatory Visit (HOSPITAL_COMMUNITY): Payer: Self-pay

## 2022-02-04 DIAGNOSIS — M791 Myalgia, unspecified site: Secondary | ICD-10-CM | POA: Diagnosis not present

## 2022-02-04 LAB — RESP PANEL BY RT-PCR (FLU A&B, COVID) ARPGX2
Influenza A by PCR: NEGATIVE
Influenza B by PCR: NEGATIVE
SARS Coronavirus 2 by RT PCR: NEGATIVE

## 2022-02-04 MED ORDER — INSULIN GLARGINE 100 UNIT/ML ~~LOC~~ SOLN
15.0000 [IU] | Freq: Every day | SUBCUTANEOUS | 0 refills | Status: DC
  Filled 2022-02-04: qty 4.5, 30d supply, fill #0

## 2022-02-04 MED ORDER — METFORMIN HCL 1000 MG PO TABS
1000.0000 mg | ORAL_TABLET | Freq: Two times a day (BID) | ORAL | 0 refills | Status: DC
  Filled 2022-02-04: qty 60, 30d supply, fill #0

## 2022-02-04 MED ORDER — ASPIRIN 81 MG PO TBEC
81.0000 mg | DELAYED_RELEASE_TABLET | Freq: Every day | ORAL | 0 refills | Status: DC
  Filled 2022-02-04: qty 30, 30d supply, fill #0

## 2022-02-04 MED ORDER — CLOZAPINE 25 MG PO TABS
25.0000 mg | ORAL_TABLET | ORAL | 0 refills | Status: DC
  Filled 2022-02-04: qty 90, 30d supply, fill #0

## 2022-02-04 MED ORDER — IBUPROFEN 400 MG PO TABS
600.0000 mg | ORAL_TABLET | Freq: Once | ORAL | Status: AC
  Administered 2022-02-04: 600 mg via ORAL
  Filled 2022-02-04: qty 1

## 2022-02-04 MED ORDER — HALOPERIDOL 5 MG PO TABS: 5.0000 mg | ORAL_TABLET | Freq: Two times a day (BID) | ORAL | Status: AC

## 2022-02-04 MED ORDER — CLONIDINE HCL 0.2 MG PO TABS
0.2000 mg | ORAL_TABLET | Freq: Two times a day (BID) | ORAL | 0 refills | Status: DC
  Filled 2022-02-04: qty 60, 30d supply, fill #0

## 2022-02-04 NOTE — Progress Notes (Signed)
Transition of Care [facility]) - Emergency Department Mini Assessment   Patient Details  Name: Mitchell [name] MRN: [id number] Date of Birth: [birth year]  Transition of Care [facility]) CM/SW Contact:    Fuller Mandril, RN Phone Number: 02/04/2022, 8:39 AM   Clinical Narrative: RNCM consulted in regards to medication assistance.  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   RNCM met with pt at bedside; pt confirms not having access to follow up care with PCP or insurance coverage. Discussed with patient importance and benefits of establishing PCP, and not utilizing the ED for primary care needs. Pt verbalized understanding and is in agreement. Discussed other options, provided list of local  affordable PCPs. RNCM obtained appointment on (11/15), time (1:15) with [place] [facility] and placed on After Visit Summary paperwork.  No further case management needs communicated at this time. Deshannon Seide J. Clydene Laming, [place], BSN, [place] [phone]     ED Mini Assessment: What brought you to the Emergency Department? : (P) pain all over  Barriers to Discharge: (P) Barriers Resolved     Means of departure: (P) Car  Interventions which prevented an admission or readmission: Medication Review, Follow-up medical appointment    Patient Contact and Communications        ,          Patient states their goals for this hospitalization and ongoing recovery are:: (P) get something for this pain      Admission diagnosis:  General Bodyaches Patient Active Problem List   Diagnosis Date Noted   Candidiasis, mouth 01/13/2022   Pulmonary infiltrate present on computed tomography 01/13/2022   Septic shock ([place]) 01/10/2022   Lithium toxicity, accidental or unintentional, initial encounter 01/06/2022   Encephalopathy acute    Acute kidney injury ([place])    Schizophrenia ([place]) 04/29/2021   Lithium toxicity 10/02/2014   Hypertension    Type 2  diabetes mellitus ([place])    Sleep apnea    Hyponatremia 03/13/2011   Hypotension 05/12/2010   PCP:  Pcp, No Pharmacy:   [id number] [place] [address]Phone: [phone]: [address]Phone: [phone]: [address]Phone: [phone]: [phone]

## 2022-02-04 NOTE — ED Provider Notes (Signed)
[facility] EMERGENCY DEPARTMENT Provider Note   CSN: [id number] Arrival date & time: 02/03/22  1757     History  Chief Complaint  Patient presents with   Generalized Body Aches    Mitchell [name] is a 61 y.o. male.  Pt is a 61 yo male with pmhx significant for htn, dm2, bipolar and schizoaffective d/o.  Pt was admitted from 10/14-10/20 for hypovolemia and septic shock from pna.  Pt's bp meds were stopped and held at d/c.  Pt was here on the 5th, 7th, and today for back pain.  Pt took an ultram this morning and it has helped sx.  Pt has also had some mild body aches.  Pt said he does not have a local pcp and has none of her meds. Pt feels much better now.  His only complaint is that he's hungry.          Home Medications Prior to Admission medications   Medication Sig Start Date End Date Taking? Authorizing Provider  aspirin EC 81 MG tablet Take 1 tablet (81 mg total) by mouth daily. Swallow whole. 02/04/22   Jacalyn Lefevre, MD  cloNIDine (CATAPRES) 0.2 MG tablet Take 1 tablet (0.2 mg total) by mouth 2 (two) times daily. 02/04/22   Jacalyn Lefevre, MD  cloZAPine (CLOZARIL) 25 MG tablet Take 1-3 tablets (25-75 mg total) by mouth See admin instructions. Taking 25 mg in the AM and 75 mg at bedtime 02/04/22 03/06/22  Jacalyn Lefevre, MD  clozapine (CLOZARIL) 50 MG tablet Take 0.5 tablets (25 mg total) by mouth in the morning. 01/08/22 02/07/22  Karie Fetch, MD  donepezil (ARICEPT) 5 MG tablet Take 1 tablet (5 mg total) by mouth at bedtime. 05/27/21 01/10/22  Lauro Franklin, MD  haloperidol (HALDOL) 5 MG tablet Take 1 tablet (5 mg total) by mouth 2 (two) times daily. 02/04/22 03/06/22  Jacalyn Lefevre, MD  insulin glargine (LANTUS) 100 UNIT/ML injection Inject 0.15 mLs (15 Units total) into the skin at bedtime. 02/04/22 03/06/22  Jacalyn Lefevre, MD  metFORMIN (GLUCOPHAGE) 1000 MG tablet Take 1 tablet (1,000 mg total) by mouth 2 (two) times daily. 02/04/22   Jacalyn Lefevre, MD  senna (SENOKOT) 8.6 MG TABS tablet Take 2 tablets (17.2 mg total) by mouth daily. 05/28/21   Lauro Franklin, MD      Allergies    Patient has no known allergies.    Review of Systems   Review of Systems  Musculoskeletal:  Positive for back pain and myalgias.  All other systems reviewed and are negative.   Physical Exam Updated Vital Signs BP (!) 184/100   Pulse 97   Temp (!) 97.5 F (36.4 C)   Resp 16   Ht 5\' 4"  (1.626 m)   Wt 95.3 kg   SpO2 98%   BMI 36.06 kg/m  Physical Exam Vitals and nursing note reviewed.  Constitutional:      Appearance: Normal appearance.  HENT:     Head: Normocephalic and atraumatic.     Right Ear: External ear normal.     Left Ear: External ear normal.     Nose: Nose normal.     Mouth/Throat:     Mouth: Mucous membranes are moist.     Pharynx: Oropharynx is clear.  Eyes:     Extraocular Movements: Extraocular movements intact.     Conjunctiva/sclera: Conjunctivae normal.     Pupils: Pupils are equal, round, and reactive to light.  Cardiovascular:  Rate and Rhythm: Normal rate and regular rhythm.     Pulses: Normal pulses.     Heart sounds: Normal heart sounds.  Pulmonary:     Effort: Pulmonary effort is normal.     Breath sounds: Normal breath sounds.  Abdominal:     General: Abdomen is flat. Bowel sounds are normal.     Palpations: Abdomen is soft.  Musculoskeletal:        General: Normal range of motion.     Cervical back: Normal range of motion and neck supple.  Skin:    General: Skin is warm.     Capillary Refill: Capillary refill takes less than 2 seconds.  Neurological:     General: No focal deficit present.     Mental Status: He is alert and oriented to person, place, and time.  Psychiatric:        Mood and Affect: Mood normal.        Behavior: Behavior normal.     ED Results / Procedures / Treatments   Labs (all labs ordered are listed, but only abnormal results are displayed) Labs Reviewed  CBC  WITH DIFFERENTIAL/PLATELET - Abnormal; Notable for the following components:      Result Value   RBC 4.05 (*)    Hemoglobin 12.3 (*)    HCT 38.4 (*)    All other components within normal limits  COMPREHENSIVE METABOLIC PANEL - Abnormal; Notable for the following components:   Glucose, Bld 308 (*)    Creatinine, Ser 1.29 (*)    All other components within normal limits  RESP PANEL BY RT-PCR (FLU A&B, COVID) ARPGX2    EKG None  Radiology CT Head Wo Contrast  Result Date: 02/02/2022 CLINICAL DATA:  Headache, new or worsening (Age >= 50y) EXAM: CT HEAD WITHOUT CONTRAST TECHNIQUE: Contiguous axial images were obtained from the base of the skull through the vertex without intravenous contrast. RADIATION DOSE REDUCTION: This exam was performed according to the departmental dose-optimization program which includes automated exposure control, adjustment of the mA and/or kV according to patient size and/or use of iterative reconstruction technique. COMPARISON:  CT head January 10, 2022. FINDINGS: Brain: No evidence of acute infarction, hemorrhage, hydrocephalus, extra-axial collection or mass lesion/mass effect. Mild patchy white matter hypodensities, nonspecific but compatible with chronic microvascular ischemic disease. Vascular: No hyperdense vessel identified. Skull: No acute fracture. Sinuses/Orbits: Paranasal sinus mucosal thickening, greatest and moderate in the right sphenoid sinus. No acute orbital findings. Other: No mastoid effusions. IMPRESSION: No evidence of acute intracranial abnormality. Electronically Signed   By: Feliberto Harts M.D.   On: 02/02/2022 17:28    Procedures Procedures    Medications Ordered in ED Medications  ibuprofen (ADVIL) tablet 600 mg (600 mg Oral Given 02/04/22 0845)    ED Course/ Medical Decision Making/ A&P                           Medical Decision Making Risk OTC drugs. Prescription drug management.   This patient presents to the ED for concern  of back pain and myalgias, this involves an extensive number of treatment options, and is a complaint that carries with it a high risk of complications and morbidity.  The differential diagnosis includes covid/flu, msk   Co morbidities that complicate the patient evaluation   htn, dm2, bipolar and schizoaffective d/o   Additional history obtained:  Additional history obtained from epic chart review   Lab Tests:  I Ordered, and  personally interpreted labs.  The pertinent results include:  cbc with hgb 12.3, glucose elevated at 308.    Medicines ordered and prescription drug management:  I ordered medication including ibuprofen  for myalgias  Reevaluation of the patient after these medicines showed that the patient improved I have reviewed the patients home medicines and have made adjustments as needed  Consultations Obtained:  I requested consultation with SW,  and discussed lab and imaging findings as well as pertinent plan - for pcp.  Appt put in AVS for next week.  Problem List / ED Course:  Myalgias:  covid/flu neg.  Pt is able to ambulate without difficulty.  He is eating and drinking well.  Pt is hyperglycemic, but is out of his meds.  1 month refill of meds ordered.  SW has set pt up with a pcp appt.   Reevaluation:  After the interventions noted above, I reevaluated the patient and found that they have :improved   Dispostion:  After consideration of the diagnostic results and the patients response to treatment, I feel that the patent would benefit from discharge with outpatient f/u.          Final Clinical Impression(s) / ED Diagnoses Final diagnoses:  Myalgia  Chronic bilateral low back pain without sciatica    Rx / DC Orders ED Discharge Orders          Ordered    cloNIDine (CATAPRES) 0.2 MG tablet  2 times daily        02/04/22 1203    aspirin EC 81 MG tablet  Daily        02/04/22 1203    haloperidol (HALDOL) 5 MG tablet  2 times daily         02/04/22 1203    insulin glargine (LANTUS) 100 UNIT/ML injection  Daily at bedtime        02/04/22 1203    metFORMIN (GLUCOPHAGE) 1000 MG tablet  2 times daily        02/04/22 1203    cloZAPine (CLOZARIL) 25 MG tablet  See admin instructions        02/04/22 1203              Jacalyn Lefevre, MD 02/04/22 1225

## 2022-02-06 NOTE — ED Notes (Signed)
I was called by the group home who was regarding the pt and I did not see that he had a guardian listed, the pt was discharged

## 2022-02-08 ENCOUNTER — Ambulatory Visit (HOSPITAL_COMMUNITY)
Admission: AD | Admit: 2022-02-08 | Discharge: 2022-02-09 | Disposition: A | Discharge status: Home / Self Care | Payer: Medicare Other | Attending: Family | Admitting: Family

## 2022-02-08 DIAGNOSIS — Z1152 Encounter for screening for COVID-19: Secondary | ICD-10-CM | POA: Insufficient documentation

## 2022-02-08 DIAGNOSIS — F209 Schizophrenia, unspecified: Secondary | ICD-10-CM | POA: Diagnosis present

## 2022-02-08 DIAGNOSIS — F259 Schizoaffective disorder, unspecified: Secondary | ICD-10-CM | POA: Insufficient documentation

## 2022-02-08 DIAGNOSIS — F2089 Other schizophrenia: Secondary | ICD-10-CM

## 2022-02-08 LAB — CBC WITH DIFFERENTIAL/PLATELET
Abs Immature Granulocytes: 0.01 10*3/uL (ref 0.00–0.07)
Basophils Absolute: 0 10*3/uL (ref 0.0–0.1)
Basophils Relative: 1 %
Eosinophils Absolute: 0.2 10*3/uL (ref 0.0–0.5)
Eosinophils Relative: 3 %
HCT: 38.3 % — ABNORMAL LOW (ref 39.0–52.0)
Hemoglobin: 12.9 g/dL — ABNORMAL LOW (ref 13.0–17.0)
Immature Granulocytes: 0 %
Lymphocytes Relative: 31 %
Lymphs Abs: 1.8 10*3/uL (ref 0.7–4.0)
MCH: 30.6 pg (ref 26.0–34.0)
MCHC: 33.7 g/dL (ref 30.0–36.0)
MCV: 91 fL (ref 80.0–100.0)
Monocytes Absolute: 0.5 10*3/uL (ref 0.1–1.0)
Monocytes Relative: 8 %
Neutro Abs: 3.4 10*3/uL (ref 1.7–7.7)
Neutrophils Relative %: 57 %
Platelets: 275 10*3/uL (ref 150–400)
RBC: 4.21 MIL/uL — ABNORMAL LOW (ref 4.22–5.81)
RDW: 14.2 % (ref 11.5–15.5)
WBC: 5.9 10*3/uL (ref 4.0–10.5)
nRBC: 0 % (ref 0.0–0.2)

## 2022-02-08 LAB — POCT URINE DRUG SCREEN - MANUAL ENTRY (I-SCREEN)
POC Amphetamine UR: NOT DETECTED
POC Buprenorphine (BUP): NOT DETECTED
POC Cocaine UR: NOT DETECTED
POC Marijuana UR: NOT DETECTED
POC Methadone UR: NOT DETECTED
POC Methamphetamine UR: NOT DETECTED
POC Morphine: NOT DETECTED
POC Oxazepam (BZO): NOT DETECTED
POC Oxycodone UR: NOT DETECTED
POC Secobarbital (BAR): NOT DETECTED

## 2022-02-08 LAB — RESP PANEL BY RT-PCR (FLU A&B, COVID) ARPGX2
Influenza A by PCR: NEGATIVE
Influenza B by PCR: NEGATIVE
SARS Coronavirus 2 by RT PCR: NEGATIVE

## 2022-02-08 LAB — GLUCOSE, CAPILLARY
Glucose-Capillary: 427 mg/dL — ABNORMAL HIGH (ref 70–99)
Glucose-Capillary: 197 mg/dL — ABNORMAL HIGH (ref 70–99)

## 2022-02-08 LAB — VALPROIC ACID LEVEL: Valproic Acid Lvl: <10 ug/mL — ABNORMAL LOW (ref 50.0–100.0)

## 2022-02-08 LAB — POC SARS CORONAVIRUS 2 AG: SARSCOV2ONAVIRUS 2 AG: NEGATIVE

## 2022-02-08 MED ORDER — INSULIN GLARGINE-YFGN 100 UNIT/ML ~~LOC~~ SOLN
15.0000 [IU] | Freq: Every day | SUBCUTANEOUS | Status: DC
  Administered 2022-02-08: 15 [IU] via SUBCUTANEOUS

## 2022-02-08 MED ORDER — INSULIN ASPART 100 UNIT/ML IJ SOLN: 0.0000 [IU] | Freq: Every day | INTRAMUSCULAR | Status: DC

## 2022-02-08 MED ORDER — INSULIN ASPART 100 UNIT/ML IJ SOLN
0.0000 [IU] | Freq: Three times a day (TID) | INTRAMUSCULAR | Status: DC
  Administered 2022-02-09: 7 [IU] via SUBCUTANEOUS
  Administered 2022-02-09: 2 [IU] via SUBCUTANEOUS

## 2022-02-08 MED ORDER — MAGNESIUM HYDROXIDE 400 MG/5ML PO SUSP: 30.0000 mL | Freq: Every day | ORAL | Status: DC | PRN

## 2022-02-08 MED ORDER — ACETAMINOPHEN 325 MG PO TABS: 650.0000 mg | ORAL_TABLET | Freq: Four times a day (QID) | ORAL | Status: DC | PRN

## 2022-02-08 MED ORDER — INSULIN ASPART 100 UNIT/ML IJ SOLN
11.0000 [IU] | Freq: Once | INTRAMUSCULAR | Status: AC
  Administered 2022-02-08: 11 [IU] via SUBCUTANEOUS

## 2022-02-08 MED ORDER — HALOPERIDOL 5 MG PO TABS
5.0000 mg | ORAL_TABLET | Freq: Two times a day (BID) | ORAL | Status: DC
  Administered 2022-02-08 – 2022-02-09 (×2): 5 mg via ORAL
  Filled 2022-02-08 (×2): qty 1

## 2022-02-08 MED ORDER — ALUM & MAG HYDROXIDE-SIMETH 200-200-20 MG/5ML PO SUSP: 30.0000 mL | ORAL | Status: DC | PRN

## 2022-02-08 MED ORDER — METFORMIN HCL 500 MG PO TABS
1000.0000 mg | ORAL_TABLET | Freq: Two times a day (BID) | ORAL | Status: DC
  Administered 2022-02-08 – 2022-02-09 (×2): 1000 mg via ORAL
  Filled 2022-02-08 (×2): qty 2

## 2022-02-08 MED ORDER — CLONIDINE HCL 0.1 MG PO TABS
0.1000 mg | ORAL_TABLET | Freq: Two times a day (BID) | ORAL | Status: DC
  Administered 2022-02-08 – 2022-02-09 (×2): 0.1 mg via ORAL
  Filled 2022-02-08 (×2): qty 1

## 2022-02-08 NOTE — ED Provider Notes (Cosign Needed Addendum)
[facility] Continuous Assessment Admission H&P  Date: 02/08/22 Patient Name: Mitchell [name] MRN: [id number] Chief Complaint:  Chief Complaint  Patient presents with   Evaluation      Diagnoses:  Final diagnoses:  Other schizophrenia [facility])    HPI: Mitchell [name] 61 year old African-American male presents accompanied by [facility] Department.  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  Chart review, patient has a baseline delusional and a  history of schizophrenia.  Reports he is currently followed by Mitchell [name] for psychiatric services. NP- Mitchell [name].   NP spoke to group home owner at length regarding patient's presentation.  She reports patient was discharged from the hospital on the ninth of November  and she has not seen or heard from him since.  States the hospital would not provide her with any information related to his discharge.  She reports she is unsure of his whereabouts and states he has been off his medications for all that time she does not feel comfortable taking him back to the group home in his current state.  Mitchell [name] seen and evaluated face-to-face by this provider.  Ongoing ruminations related to Mitchell [name] " she put me out of my house" he is denying suicidal or homicidal ideations.  Denies auditory or visual hallucinations.  Patient appears to be a poor historian.  Unsure of current medications he is prescribed unable to articulate last time he is taking any medications.   Patient is requesting to go back to reside in the group home [facility].)however Mitchell [name] declined at this time.  Case staffed with attending psychiatrist MD Mitchell [name].  Will restart home medications.  Patient to be placed on continuous assessment with anticipated discharge 02/10/2019.    PHQ 2-9:  [place] [facility] from 04/16/2021 in [facility]  Thoughts that you would be better off dead, or of hurting yourself in some  way Not at all  PHQ-9 Total Score 0       Flowsheet Row ED from 02/03/2022 in [place] [facility] from 02/02/2022 in [place] ED from 01/31/2022 in [place] No Risk No Risk No Risk        Total Time spent with patient: 15 minutes  Musculoskeletal  Strength & Muscle Tone: within Mitchell limits Gait & Station: Mitchell Patient leans: N/A  Psychiatric Specialty Exam  Presentation General Appearance:  Casual  Eye Contact: Minimal  Speech: Clear and Coherent  Speech Volume: Mitchell  Handedness: Right   Mood and Affect  Mood: Anxious; Irritable  Affect: Congruent   Thought Process  Thought Processes: Coherent  Descriptions of Associations:Loose  Orientation:Partial  Thought Content:Rumination  Diagnosis of Schizophrenia or Schizoaffective disorder in past: Yes  Duration of Psychotic Symptoms: Greater than six months  Hallucinations:Hallucinations: None  Ideas of Reference:Delusions  Suicidal Thoughts:Suicidal Thoughts: No  Homicidal Thoughts:Homicidal Thoughts: No   Sensorium  Memory: Immediate Fair; Recent Fair; Remote Fair  Judgment: Fair  Insight: Poor   Executive Functions  Concentration: Poor  Attention Span: Poor  Recall: Poor  Fund of Knowledge: Poor  Language: Poor   Psychomotor Activity  Psychomotor Activity:Psychomotor Activity: Mitchell   Assets  Assets: Social Support; Desire for Improvement   Sleep  Sleep:Sleep: Fair   Nutritional Assessment (For OBS and FBC admissions only) Has the patient had a weight loss or gain of 10 pounds or more  in the last 3 months?: No Has the patient had a decrease in food intake/or appetite?: No Does the patient have dental problems?: No Does the patient have eating habits or behaviors that may be indicators of an eating disorder including binging  or inducing vomiting?: No Has the patient recently lost weight without trying?: 0 Has the patient been eating poorly because of a decreased appetite?: 0 Malnutrition Screening Tool Score: 0    Physical Exam Vitals and nursing note reviewed.  Cardiovascular:     Rate and Rhythm: Mitchell rate and regular rhythm.  Neurological:     Mental Status: He is alert and oriented to person, place, and time.  Psychiatric:        Mood and Affect: Mood Mitchell.        Behavior: Behavior Mitchell.    Review of Systems  Respiratory: Negative.    Cardiovascular: Negative.   Gastrointestinal: Negative.   Psychiatric/Behavioral:  The patient is nervous/anxious.   All other systems reviewed and are negative.   Blood pressure (!) 141/96, pulse (!) 102, temperature 97.6 F (36.4 C), resp. rate 20, SpO2 98 %. There is no height or weight on file to calculate BMI.  Past Psychiatric History: Charted history with schizophrenia, currently prescribed Clozaril  Is the patient at risk to self? No  Has the patient been a risk to self in the past 6 months? No .    Has the patient been a risk to self within the distant past? No   Is the patient a risk to others? No   Has the patient been a risk to others in the past 6 months? No   Has the patient been a risk to others within the distant past? No   Past Medical History:  Past Medical History:  Diagnosis Date   Bipolar affective disorder ([place])    takes Zyprexa daily   Coarse tremors 10/02/2014   Diabetes mellitus    takes Victoza,Metformin,and Glipizide daily   Hypertension    takes Amlodipine,Lisinopril and Clonidine daily   Hyponatremia    history of   Lithium toxicity 10/02/2014   Mental disorder    takes Lithium daily   Schizoaffective disorder    takes Trazodone nightly   Schizoaffective disorder ([place]) 01/04/2019   Seasonal allergies    takes Claritin daily   Sleep apnea    sleep study >80yrs ago   Stroke [facility])    left arm weakness    Past  Surgical History:  Procedure Laterality Date   CATARACT EXTRACTION W/PHACO Right 02/14/2013   Procedure: CATARACT EXTRACTION PHACO AND INTRAOCULAR LENS PLACEMENT ([place]);  Surgeon: Adonis Brook, MD;  Location: [place];  Service: Ophthalmology;  Laterality: Right;   CATARACT EXTRACTION W/PHACO Left 06/13/2013   Procedure: CATARACT EXTRACTION PHACO AND INTRAOCULAR LENS PLACEMENT ([facility]);  Surgeon: Adonis Brook, MD;  Location: [place];  Service: Ophthalmology;  Laterality: Left;   CIRCUMCISION  20 yrs. ago   EYE SURGERY      Family History: No family history on file.  Social History:  Social History   Socioeconomic History   Marital status: Divorced    Spouse name: Not on file   Number of children: Not on file   Years of education: Not on file   Highest education level: Not on file  Occupational History   Not on file  Tobacco Use   Smoking status: Never   Smokeless tobacco: Never  Vaping Use   Vaping Use: Never used  Substance and Sexual  Activity   Alcohol use: Not Currently   Drug use: No   Sexual activity: Yes    Birth control/protection: None  Other Topics Concern   Not on file  Social History Narrative   Not on file   Social Determinants of Health   Financial Resource Strain: Not on file  Food Insecurity: Not on file  Transportation Needs: Not on file  Physical Activity: Not on file  Stress: Not on file  Social Connections: Not on file  Intimate Partner Violence: Not on file    SDOH:  SDOH Screenings   Alcohol Screen: Low Risk  (04/23/2021)  Depression (PHQ2-9): Low Risk  (04/16/2021)  Tobacco Use: Low Risk  (02/03/2022)    Last Labs:  Admission on 02/03/2022, Discharged on 02/04/2022  Component Date Value Ref Range Status   WBC 02/03/2022 6.5  4.0 - 10.5 K/uL Final   RBC 02/03/2022 4.05 (L)  4.22 - 5.81 MIL/uL Final   Hemoglobin 02/03/2022 12.3 (L)  13.0 - 17.0 g/dL Final   HCT 02/03/2022 38.4 (L)  39.0 - 52.0 % Final   MCV 02/03/2022 94.8  80.0 - 100.0 fL Final    MCH 02/03/2022 30.4  26.0 - 34.0 pg Final   MCHC 02/03/2022 32.0  30.0 - 36.0 g/dL Final   RDW 02/03/2022 14.6  11.5 - 15.5 % Final   Platelets 02/03/2022 256  150 - 400 K/uL Final   nRBC 02/03/2022 0.0  0.0 - 0.2 % Final   Neutrophils Relative % 02/03/2022 49  % Final   Neutro Abs 02/03/2022 3.2  1.7 - 7.7 K/uL Final   Lymphocytes Relative 02/03/2022 32  % Final   Lymphs Abs 02/03/2022 2.1  0.7 - 4.0 K/uL Final   Monocytes Relative 02/03/2022 15  % Final   Monocytes Absolute 02/03/2022 1.0  0.1 - 1.0 K/uL Final   Eosinophils Relative 02/03/2022 3  % Final   Eosinophils Absolute 02/03/2022 0.2  0.0 - 0.5 K/uL Final   Basophils Relative 02/03/2022 1  % Final   Basophils Absolute 02/03/2022 0.0  0.0 - 0.1 K/uL Final   Immature Granulocytes 02/03/2022 0  % Final   Abs Immature Granulocytes 02/03/2022 0.01  0.00 - 0.07 K/uL Final   Performed at [facility], [place] [address] [place], [place] [address]   Sodium 02/03/2022 140  135 - 145 mmol/L Final   Potassium 02/03/2022 4.9  3.5 - 5.1 mmol/L Final   Chloride 02/03/2022 106  98 - 111 mmol/L Final   CO2 02/03/2022 23  22 - 32 mmol/L Final   Glucose, Bld 02/03/2022 308 (H)  70 - 99 mg/dL Final   Glucose reference range applies only to samples taken after fasting for at least 8 hours.   BUN 02/03/2022 15  6 - 20 mg/dL Final   Creatinine, Ser 02/03/2022 1.29 (H)  0.61 - 1.24 mg/dL Final   Calcium 02/03/2022 9.4  8.9 - 10.3 mg/dL Final   Total Protein 02/03/2022 7.9  6.5 - 8.1 g/dL Final   Albumin 02/03/2022 4.1  3.5 - 5.0 g/dL Final   AST 02/03/2022 31  15 - 41 U/L Final   ALT 02/03/2022 21  0 - 44 U/L Final   Alkaline Phosphatase 02/03/2022 102  38 - 126 U/L Final   Total Bilirubin 02/03/2022 0.9  0.3 - 1.2 mg/dL Final   GFR, Estimated 02/03/2022 >60  >60 mL/min Final   Comment: (NOTE) Calculated using the CKD-EPI Creatinine Equation (2021)    Anion gap  02/03/2022 11  5 - 15 Final   Performed at [facility], [place]  [address] [place], [place] [address]   SARS Coronavirus 2 by RT PCR 02/04/2022 NEGATIVE  NEGATIVE Final   Comment: (NOTE) SARS-CoV-2 target nucleic acids are NOT DETECTED.  The SARS-CoV-2 RNA is generally detectable in upper respiratory specimens during the acute phase of infection. The lowest concentration of SARS-CoV-2 viral copies this assay can detect is 138 copies/mL. A negative result does not preclude SARS-Cov-2 infection and should not be used as the sole basis for treatment or other patient management decisions. A negative result may occur with  improper specimen collection/handling, submission of specimen other than nasopharyngeal swab, presence of viral mutation(s) within the areas targeted by this assay, and inadequate number of viral copies(<138 copies/mL). A negative result must be combined with clinical observations, patient history, and epidemiological information. The expected result is Negative.  Fact Sheet for Patients:  EntrepreneurPulse.com.au  Fact Sheet for Healthcare Providers:  IncredibleEmployment.be  This test is no                          t yet approved or cleared by the [place] FDA and  has been authorized for detection and/or diagnosis of SARS-CoV-2 by FDA under an Emergency Use Authorization (EUA). This EUA will remain  in effect (meaning this test can be used) for the duration of the COVID-19 declaration under Section 564(b)(1) of the Act, 21 U.S.C.section 360bbb-3(b)(1), unless the authorization is terminated  or revoked sooner.       Influenza A by PCR 02/04/2022 NEGATIVE  NEGATIVE Final   Influenza B by PCR 02/04/2022 NEGATIVE  NEGATIVE Final   Comment: (NOTE) The Xpert Xpress SARS-CoV-2/FLU/RSV plus assay is intended as an aid in the diagnosis of influenza from Nasopharyngeal swab specimens and should not be used as a sole basis for treatment. Nasal washings and aspirates are unacceptable for Xpert Xpress  SARS-CoV-2/FLU/RSV testing.  Fact Sheet for Patients: EntrepreneurPulse.com.au  Fact Sheet for Healthcare Providers: IncredibleEmployment.be  This test is not yet approved or cleared by the [place] FDA and has been authorized for detection and/or diagnosis of SARS-CoV-2 by FDA under an Emergency Use Authorization (EUA). This EUA will remain in effect (meaning this test can be used) for the duration of the COVID-19 declaration under Section 564(b)(1) of the Act, 21 U.S.C. section 360bbb-3(b)(1), unless the authorization is terminated or revoked.  Performed at [facility], [place] [address] [place], [place] [address]   Admission on 02/02/2022, Discharged on 02/02/2022  Component Date Value Ref Range Status   Sodium 02/02/2022 134 (L)  135 - 145 mmol/L Final   Potassium 02/02/2022 4.6  3.5 - 5.1 mmol/L Final   Chloride 02/02/2022 100  98 - 111 mmol/L Final   CO2 02/02/2022 23  22 - 32 mmol/L Final   Glucose, Bld 02/02/2022 344 (H)  70 - 99 mg/dL Final   Glucose reference range applies only to samples taken after fasting for at least 8 hours.   BUN 02/02/2022 18  6 - 20 mg/dL Final   Creatinine, Ser 02/02/2022 1.40 (H)  0.61 - 1.24 mg/dL Final   Calcium 02/02/2022 9.5  8.9 - 10.3 mg/dL Final   GFR, Estimated 02/02/2022 58 (L)  >60 mL/min Final   Comment: (NOTE) Calculated using the CKD-EPI Creatinine Equation (2021)    Anion gap 02/02/2022 11  5 - 15 Final   Performed at [facility], [address], [place], [place] [address]   WBC 02/02/2022 7.7  4.0 - 10.5 K/uL Final   RBC 02/02/2022 4.45  4.22 - 5.81 MIL/uL Final   Hemoglobin 02/02/2022 13.7  13.0 - 17.0 g/dL Final   HCT 36/64/4034 40.9  39.0 - 52.0 % Final   MCV 02/02/2022 91.9  80.0 - 100.0 fL Final   MCH 02/02/2022 30.8  26.0 - 34.0 pg Final   MCHC 02/02/2022 33.5  30.0 - 36.0 g/dL Final   RDW 74/25/9563 14.5  11.5 - 15.5 % Final   Platelets 02/02/2022 242  150 - 400  K/uL Final   nRBC 02/02/2022 0.0  0.0 - 0.2 % Final   Neutrophils Relative % 02/02/2022 52  % Final   Neutro Abs 02/02/2022 4.0  1.7 - 7.7 K/uL Final   Lymphocytes Relative 02/02/2022 29  % Final   Lymphs Abs 02/02/2022 2.3  0.7 - 4.0 K/uL Final   Monocytes Relative 02/02/2022 17  % Final   Monocytes Absolute 02/02/2022 1.3 (H)  0.1 - 1.0 K/uL Final   Eosinophils Relative 02/02/2022 2  % Final   Eosinophils Absolute 02/02/2022 0.2  0.0 - 0.5 K/uL Final   Basophils Relative 02/02/2022 0  % Final   Basophils Absolute 02/02/2022 0.0  0.0 - 0.1 K/uL Final   Immature Granulocytes 02/02/2022 0  % Final   Abs Immature Granulocytes 02/02/2022 0.03  0.00 - 0.07 K/uL Final   Performed at [facility], [address], [place], [place] [address]   Glucose-Capillary 02/02/2022 334 (H)  70 - 99 mg/dL Final   Glucose reference range applies only to samples taken after fasting for at least 8 hours.   Glucose-Capillary 02/02/2022 304 (H)  70 - 99 mg/dL Final   Glucose reference range applies only to samples taken after fasting for at least 8 hours.  Admission on 01/31/2022, Discharged on 01/31/2022  Component Date Value Ref Range Status   Sodium 01/31/2022 132 (L)  135 - 145 mmol/L Final   Potassium 01/31/2022 4.1  3.5 - 5.1 mmol/L Final   Chloride 01/31/2022 94 (L)  98 - 111 mmol/L Final   CO2 01/31/2022 21 (L)  22 - 32 mmol/L Final   Glucose, Bld 01/31/2022 390 (H)  70 - 99 mg/dL Final   Glucose reference range applies only to samples taken after fasting for at least 8 hours.   BUN 01/31/2022 14  6 - 20 mg/dL Final   Creatinine, Ser 01/31/2022 1.24  0.61 - 1.24 mg/dL Final   Calcium 33/29/5188 9.3  8.9 - 10.3 mg/dL Final   GFR, Estimated 01/31/2022 >60  >60 mL/min Final   Comment: (NOTE) Calculated using the CKD-EPI Creatinine Equation (2021)    Anion gap 01/31/2022 17 (H)  5 - 15 Final   Performed at [facility], [address] [place], [place] [address]   WBC 01/31/2022 5.5  4.0  - 10.5 K/uL Final   RBC 01/31/2022 3.93 (L)  4.22 - 5.81 MIL/uL Final   Hemoglobin 01/31/2022 12.0 (L)  13.0 - 17.0 g/dL Final   HCT 63/03/6008 36.4 (L)  39.0 - 52.0 % Final   MCV 01/31/2022 92.6  80.0 - 100.0 fL Final   MCH 01/31/2022 30.5  26.0 - 34.0 pg Final   MCHC 01/31/2022 33.0  30.0 - 36.0 g/dL Final   RDW 93/23/5573 14.0  11.5 - 15.5 % Final   Platelets 01/31/2022 244  150 - 400 K/uL Final  nRBC 01/31/2022 0.0  0.0 - 0.2 % Final   Performed at [facility], [place] [address] [place], [place] [address]   Troponin I (High Sensitivity) 01/31/2022 3  <18 ng/L Final   Comment: (NOTE) Elevated high sensitivity troponin I (hsTnI) values and significant  changes across serial measurements may suggest ACS but many other  chronic and acute conditions are known to elevate hsTnI results.  Refer to the "Links" section for chest pain algorithms and additional  guidance. Performed at [facility], [place] [address] [place], [place] [address]    Troponin I (High Sensitivity) 01/31/2022 3  <18 ng/L Final   Comment: (NOTE) Elevated high sensitivity troponin I (hsTnI) values and significant  changes across serial measurements may suggest ACS but many other  chronic and acute conditions are known to elevate hsTnI results.  Refer to the "Links" section for chest pain algorithms and additional  guidance. Performed at [facility], [place] [address] [place], [place] [address]   No results displayed because visit has over 200 results.    Admission on 01/06/2022, Discharged on 01/08/2022  Component Date Value Ref Range Status   Glucose-Capillary 01/06/2022 173 (H)  70 - 99 mg/dL Final   Glucose reference range applies only to samples taken after fasting for at least 8 hours.   Sodium 01/06/2022 138  135 - 145 mmol/L Final   Potassium 01/06/2022 4.5  3.5 - 5.1 mmol/L Final   Chloride 01/06/2022 104  98 - 111 mmol/L Final   BUN 01/06/2022 16  6 - 20 mg/dL Final   Creatinine, Ser  01/06/2022 1.60 (H)  0.61 - 1.24 mg/dL Final   Glucose, Bld 01/06/2022 175 (H)  70 - 99 mg/dL Final   Glucose reference range applies only to samples taken after fasting for at least 8 hours.   Calcium, Ion 01/06/2022 1.22  1.15 - 1.40 mmol/L Final   TCO2 01/06/2022 25  22 - 32 mmol/L Final   Hemoglobin 01/06/2022 15.0  13.0 - 17.0 g/dL Final   HCT 01/06/2022 44.0  39.0 - 52.0 % Final   Alcohol, Ethyl (B) 01/06/2022 <10  <10 mg/dL Final   Comment: (NOTE) Lowest detectable limit for serum alcohol is 10 mg/dL.  For medical purposes only. Performed at [facility], [place] [address], [place], [place] [address]    Prothrombin Time 01/06/2022 13.1  11.4 - 15.2 seconds Final   INR 01/06/2022 1.0  0.8 - 1.2 Final   Comment: (NOTE) INR goal varies based on device and disease states. Performed at [facility], [place] [address] [place], [place] [address]    aPTT 01/06/2022 31  24 - 36 seconds Final   Performed at [place] [address], [place], [place] [address]   WBC 01/06/2022 10.3  4.0 - 10.5 K/uL Final   RBC 01/06/2022 4.60  4.22 - 5.81 MIL/uL Final   Hemoglobin 01/06/2022 14.0  13.0 - 17.0 g/dL Final   HCT 01/06/2022 42.2  39.0 - 52.0 % Final   MCV 01/06/2022 91.7  80.0 - 100.0 fL Final   MCH 01/06/2022 30.4  26.0 - 34.0 pg Final   MCHC 01/06/2022 33.2  30.0 - 36.0 g/dL Final   RDW 01/06/2022 14.0  11.5 - 15.5 % Final   Platelets 01/06/2022 280  150 - 400 K/uL Final   nRBC 01/06/2022 0.0  0.0 - 0.2 % Final   Performed at [place]  [address] [place], [place] [address]   Neutrophils Relative % 01/06/2022 59  % Final   Neutro Abs 01/06/2022 6.2  1.7 - 7.7 K/uL Final   Lymphocytes Relative 01/06/2022 28  % Final   Lymphs Abs 01/06/2022 2.9  0.7 - 4.0 K/uL Final   Monocytes Relative 01/06/2022 5  % Final   Monocytes Absolute 01/06/2022 0.6  0.1 - 1.0 K/uL Final   Eosinophils Relative 01/06/2022 6  % Final   Eosinophils Absolute 01/06/2022 0.6 (H)   0.0 - 0.5 K/uL Final   Basophils Relative 01/06/2022 1  % Final   Basophils Absolute 01/06/2022 0.1  0.0 - 0.1 K/uL Final   Immature Granulocytes 01/06/2022 1  % Final   Abs Immature Granulocytes 01/06/2022 0.05  0.00 - 0.07 K/uL Final   Performed at [facility], [place] [address] [place], [place] [address]   Sodium 01/06/2022 137  135 - 145 mmol/L Final   Potassium 01/06/2022 4.6  3.5 - 5.1 mmol/L Final   Chloride 01/06/2022 105  98 - 111 mmol/L Final   CO2 01/06/2022 26  22 - 32 mmol/L Final   Glucose, Bld 01/06/2022 179 (H)  70 - 99 mg/dL Final   Glucose reference range applies only to samples taken after fasting for at least 8 hours.   BUN 01/06/2022 13  6 - 20 mg/dL Final   Creatinine, Ser 01/06/2022 1.68 (H)  0.61 - 1.24 mg/dL Final   Calcium 01/06/2022 9.9  8.9 - 10.3 mg/dL Final   Total Protein 01/06/2022 8.3 (H)  6.5 - 8.1 g/dL Final   Albumin 01/06/2022 4.1  3.5 - 5.0 g/dL Final   AST 01/06/2022 27  15 - 41 U/L Final   ALT 01/06/2022 31  0 - 44 U/L Final   Alkaline Phosphatase 01/06/2022 170 (H)  38 - 126 U/L Final   Total Bilirubin 01/06/2022 0.6  0.3 - 1.2 mg/dL Final   GFR, Estimated 01/06/2022 46 (L)  >60 mL/min Final   Comment: (NOTE) Calculated using the CKD-EPI Creatinine Equation (2021)    Anion gap 01/06/2022 6  5 - 15 Final   Performed at [place] [address], [place], [place] [address]   Opiates 01/06/2022 NONE DETECTED  NONE DETECTED Final   Cocaine 01/06/2022 NONE DETECTED  NONE DETECTED Final   Benzodiazepines 01/06/2022 NONE DETECTED  NONE DETECTED Final   Amphetamines 01/06/2022 NONE DETECTED  NONE DETECTED Final   Tetrahydrocannabinol 01/06/2022 NONE DETECTED  NONE DETECTED Final   Barbiturates 01/06/2022 NONE DETECTED  NONE DETECTED Final   Comment: (NOTE) DRUG SCREEN FOR MEDICAL PURPOSES ONLY.  IF CONFIRMATION IS NEEDED FOR ANY PURPOSE, NOTIFY LAB WITHIN 5 DAYS.  LOWEST DETECTABLE LIMITS FOR URINE DRUG SCREEN Drug Class                      Cutoff (ng/mL) Amphetamine and metabolites    1000 Barbiturate and metabolites    200 Benzodiazepine                 200 Opiates and metabolites        300 Cocaine and metabolites        300 THC                            50 Performed at [facility], [place] [address], [place], [place] [address]    Color, Urine 01/06/2022 YELLOW  YELLOW  Final   APPearance 01/06/2022 CLEAR  CLEAR Final   Specific Gravity, Urine 01/06/2022 1.010  1.005 - 1.030 Final   pH 01/06/2022 6.0  5.0 - 8.0 Final   Glucose, UA 01/06/2022 NEGATIVE  NEGATIVE mg/dL Final   Hgb urine dipstick 01/06/2022 NEGATIVE  NEGATIVE Final   Bilirubin Urine 01/06/2022 NEGATIVE  NEGATIVE Final   Ketones, ur 01/06/2022 NEGATIVE  NEGATIVE mg/dL Final   Protein, ur 01/06/2022 NEGATIVE  NEGATIVE mg/dL Final   Nitrite 01/06/2022 NEGATIVE  NEGATIVE Final   Leukocytes,Ua 01/06/2022 NEGATIVE  NEGATIVE Final   Performed at [facility], [place] [address] [place], [place] [address]   Lithium Lvl 01/06/2022 1.99 (HH)  0.60 - 1.20 mmol/L Final   Comment: CRITICAL RESULT CALLED TO, READ BACK BY AND VERIFIED WITH ROW[place],S RN @ 1159 01/06/22 LEONARD,A Performed at [facility], [place] [address], [place], [place] [address]    SARS Coronavirus 2 by RT PCR 01/06/2022 NEGATIVE  NEGATIVE Final   Comment: (NOTE) SARS-CoV-2 target nucleic acids are NOT DETECTED.  The SARS-CoV-2 RNA is generally detectable in upper and lower respiratory specimens during the acute phase of infection. The lowest concentration of SARS-CoV-2 viral copies this assay can detect is 250 copies / mL. A negative result does not preclude SARS-CoV-2 infection and should not be used as the sole basis for treatment or other patient management decisions.  A negative result may occur with improper specimen collection / handling, submission of specimen other than nasopharyngeal swab, presence of viral mutation(s) within the areas targeted by this assay, and  inadequate number of viral copies (<250 copies / mL). A negative result must be combined with clinical observations, patient history, and epidemiological information.  Fact Sheet for Patients:   https://www.patel.info/  Fact Sheet for Healthcare Providers: https://hall.com/  This test is not yet approved or                           cleared by the [place] FDA and has been authorized for detection and/or diagnosis of SARS-CoV-2 by FDA under an Emergency Use Authorization (EUA).  This EUA will remain in effect (meaning this test can be used) for the duration of the COVID-19 declaration under Section 564(b)(1) of the Act, 21 U.S.C. section 360bbb-3(b)(1), unless the authorization is terminated or revoked sooner.  Performed at [facility], [place] [address], [place], [place] [address]    Clozapine Lvl 01/06/2022 486  350 - 600 ng/mL Final   Comment: (NOTE) This test was developed and its performance characteristics determined by [facility]. It has not been cleared or approved by the Food and Drug Administration.    NorClozapine 01/06/2022 147  Not Estab. ng/mL Final   Comment: (NOTE) This test was developed and its performance characteristics determined by [facility]. It has not been cleared or approved by the Food and Drug Administration.    Total(Cloz+Norcloz) 01/06/2022 633  ng/mL Final   Comment: (NOTE) Plasma concentrations of clozapine plus norclozapine (combined total) greater than 450 ng/mL have been associated with therapeutic effect.                                Detection Limit = 20 Performed At: [facility] [place], [place] [id number] Rush Farmer MD [id number]    Lithium Lvl 01/06/2022 1.82 (HH)  0.60 - 1.20 mmol/L Final   Comment: CRITICAL RESULT  CALLED TO, READ BACK BY AND VERIFIED WITH ROWLAND,S RN @ 1540 01/06/22 LEONARD,A Performed at [facility], [place] [address]  [place], [place] [address]    Acetaminophen (Tylenol), Serum 01/06/2022 <10 (L)  10 - 30 ug/mL Final   Comment: (NOTE) Therapeutic concentrations vary significantly. A range of 10-30 ug/mL  may be an effective concentration for many patients. However, some  are best treated at concentrations outside of this range. Acetaminophen concentrations >150 ug/mL at 4 hours after ingestion  and >50 ug/mL at 12 hours after ingestion are often associated with  toxic reactions.  Performed at [facility], [place] [address], [place], [place] [address]    Hgb A1c MFr Bld 01/06/2022 9.5 (H)  4.8 - 5.6 % Final   Comment: (NOTE) Pre diabetes:          5.7%-6.4%  Diabetes:              >6.4%  Glycemic control for   <7.0% adults with diabetes    Mean Plasma Glucose 01/06/2022 225.95  mg/dL Final   Performed at [facility], [place] [address], [place], [place] [address]   Free T4 01/06/2022 0.66  0.61 - 1.12 ng/dL Final   Comment: (NOTE) Biotin ingestion may interfere with free T4 tests. If the results are inconsistent with the TSH level, previous test results, or the clinical presentation, then consider biotin interference. If needed, order repeat testing after stopping biotin. Performed at [facility], [place] [address], [place], [place] [address]    TSH 01/06/2022 2.402  0.350 - 4.500 uIU/mL Final   Comment: Performed by a 3rd Generation assay with a functional sensitivity of <=0.01 uIU/mL. Performed at [facility], [place] [address], [place], [place] [address]    Hepatitis B Surface Ag 01/06/2022 See Scanned report in [place] (A)  NON REACTIVE Final   Comment: Performed at [employer] Performed at [facility], [place] [address] [place], [place] [phone]    Hep B S Ab 01/06/2022 See Scanned report in [place] (A)  NON REACTIVE Final   Comment: Performed at [employer] Performed at [facility], [place] [address] [place],  [address]     Hep B S AB Quant (Post) 01/06/2022 <3.1 (L)  Immunity>9.9 mIU/mL Final   Comment: (NOTE)  Status of Immunity                     Anti-HBs Level  ------------------                     -------------- Inconsistent with Immunity                   0.0 - 9.9 Consistent with Immunity                          >9.9 Performed At: [facility] [address] [place], [place] [id number] Rush Farmer MD [id number]    HCV Ab 01/06/2022 Non Reactive (A)  Non Reactive Final   Comment: (NOTE) HCV antibody alone does not differentiate between previously resolved infection and active infection. Equivocal and Reactive HCV antibody results should be followed up with an HCV RNA test to support the diagnosis of active HCV infection. Performed At: [facility] [address] [place], [place] [id number] Rush Farmer MD [id number]    Glucose-Capillary 01/06/2022 127 (H)  70 - 99 mg/dL Final   Glucose reference range applies only to samples taken after fasting for at least 8 hours.   Ammonia 01/06/2022 36 (H)  9 - 35 umol/L Final   Comment: HEMOLYSIS AT THIS LEVEL MAY AFFECT RESULT Performed at [facility], [place] [address] [place], [place] [address]    HIV Screen 4th Generation wRfx 01/07/2022 Non Reactive  Non Reactive Final   Performed at [facility], [place] [address], [place], [place] [address]   Sodium 01/07/2022 133 (L)  135 - 145 mmol/L Final   Potassium 01/07/2022 3.3 (L)  3.5 - 5.1 mmol/L Final   Chloride 01/07/2022 98  98 - 111 mmol/L Final   CO2 01/07/2022 27  22 - 32 mmol/L Final   Glucose, Bld 01/07/2022 194 (H)  70 - 99 mg/dL Final   Glucose reference range applies only to samples taken after fasting for at least 8 hours.   BUN 01/07/2022 <5 (L)  6 - 20 mg/dL Final   Creatinine, Ser 01/07/2022 0.86  0.61 - 1.24 mg/dL Final   Calcium 01/07/2022 8.2 (L)  8.9 - 10.3 mg/dL Final   Total Protein 01/07/2022 6.9  6.5 - 8.1 g/dL Final   Albumin 01/07/2022 3.5   3.5 - 5.0 g/dL Final   AST 01/07/2022 21  15 - 41 U/L Final   ALT 01/07/2022 26  0 - 44 U/L Final   Alkaline Phosphatase 01/07/2022 158 (H)  38 - 126 U/L Final   Total Bilirubin 01/07/2022 0.3  0.3 - 1.2 mg/dL Final   GFR, Estimated 01/07/2022 >60  >60 mL/min Final   Comment: (NOTE) Calculated using the CKD-EPI Creatinine Equation (2021)    Anion gap 01/07/2022 8  5 - 15 Final   Performed at [facility] [address] [place], [place] [address]   WBC 01/07/2022 8.7  4.0 - 10.5 K/uL Final   RBC 01/07/2022 3.94 (L)  4.22 - 5.81 MIL/uL Final   Hemoglobin 01/07/2022 12.2 (L)  13.0 - 17.0 g/dL Final   HCT 01/07/2022 34.2 (L)  39.0 - 52.0 % Final   MCV 01/07/2022 86.8  80.0 - 100.0 fL Final   MCH 01/07/2022 31.0  26.0 - 34.0 pg Final   MCHC 01/07/2022 35.7  30.0 - 36.0 g/dL Final   RDW 01/07/2022 13.7  11.5 - 15.5 % Final   Platelets 01/07/2022 223  150 - 400 K/uL Final   nRBC 01/07/2022 0.0  0.0 - 0.2 % Final   Performed at [facility] [address], [place], [place] [address]   Lithium Lvl 01/06/2022 0.84  0.60 - 1.20 mmol/L Final   Performed at [facility] [address] [place], [place] [address]   Lithium Lvl 01/06/2022 0.39 (L)  0.60 - 1.20 mmol/L Final   Performed at [facility] [address] [place], [place] [address]   Lithium Lvl 01/07/2022 0.55 (L)  0.60 - 1.20 mmol/L Final   Performed at [facility] [address] [place], [place] [address]   Magnesium 01/07/2022 1.7  1.7 - 2.4 mg/dL Final   Performed at [facility] [address], [place], [place] [address]   Phosphorus 01/07/2022 2.0 (L)  2.5 - 4.6 mg/dL Final   Performed at [facility] [address], [place], [place] [address]   Lithium Lvl 01/07/2022 0.69  0.60 - 1.20 mmol/L Final   Performed at [facility], [place] [address] [place], [place] [address]   Lithium Lvl 01/07/2022 0.68  0.60 - 1.20 mmol/L Final   Performed at [facility]  [address], [place], [place] [address]   Lithium Lvl 01/07/2022 0.69  0.60 - 1.20 mmol/L Final   Performed at [facility] [address], [place], [place] [address]   Glucose-Capillary 01/07/2022 177 (H)  70 - 99 mg/dL Final   Glucose reference range applies only to samples taken after fasting for at least 8 hours.   Glucose-Capillary 01/07/2022 231 (H)  70 - 99 mg/dL Final   Glucose reference range applies only to samples taken after fasting for at least 8 hours.   Glucose-Capillary 01/07/2022 207 (H)  70 - 99 mg/dL Final   Glucose reference range applies only to samples taken after fasting for at least 8 hours.   Glucose-Capillary 01/07/2022 257 (H)  70 - 99 mg/dL Final   Glucose reference range applies only to samples taken after fasting for at least 8 hours.   WBC 01/08/2022 7.7  4.0 - 10.5 K/uL Final   RBC 01/08/2022 4.24  4.22 - 5.81 MIL/uL Final   Hemoglobin 01/08/2022 12.9 (L)  13.0 - 17.0 g/dL Final   HCT 01/08/2022 37.7 (L)  39.0 - 52.0 % Final   MCV 01/08/2022 88.9  80.0 - 100.0 fL Final   MCH 01/08/2022 30.4  26.0 - 34.0 pg Final   MCHC 01/08/2022 34.2  30.0 - 36.0 g/dL Final   RDW 01/08/2022 13.9  11.5 - 15.5 % Final   Platelets 01/08/2022 229  150 - 400 K/uL Final   nRBC 01/08/2022 0.0  0.0 - 0.2 % Final   Performed at [facility] [address] [place], [place] [address]   Lithium Lvl 01/08/2022 0.54 (L)  0.60 - 1.20 mmol/L Final   Performed at [facility] [address], [place], [place] [address]   Sodium 01/08/2022 138  135 - 145 mmol/L Final   Potassium 01/08/2022 4.3  3.5 - 5.1 mmol/L Final   Chloride 01/08/2022 105  98 - 111 mmol/L Final   CO2 01/08/2022 25  22 - 32 mmol/L Final   Glucose, Bld 01/08/2022 231 (H)  70 - 99 mg/dL Final   Glucose reference range applies only to samples taken after fasting for at least 8 hours.   BUN 01/08/2022 7  6 - 20 mg/dL Final   Creatinine, Ser 01/08/2022 1.19  0.61 - 1.24 mg/dL Final   Calcium 01/08/2022 9.3  8.9  - 10.3 mg/dL Final   Phosphorus 01/08/2022 3.3  2.5 - 4.6 mg/dL Final   Albumin 01/08/2022 3.6  3.5 - 5.0 g/dL Final   GFR, Estimated 01/08/2022 >60  >60 mL/min Final   Comment: (NOTE) Calculated using the CKD-EPI Creatinine Equation (2021)    Anion gap 01/08/2022 8  5 - 15 Final   Performed at [facility], [place] [address], [place], [place] [address]   Glucose-Capillary 01/07/2022 154 (H)  70 - 99 mg/dL Final   Glucose reference range applies only to samples taken after fasting for at least 8 hours.   Glucose-Capillary 01/08/2022 249 (H)  70 - 99 mg/dL Final   Glucose reference range applies only to samples taken after fasting for at least 8 hours.   Glucose-Capillary 01/08/2022 316 (H)  70 - 99 mg/dL Final   Glucose reference range applies only to samples taken after fasting for at least 8  hours.   Glucose-Capillary 01/08/2022 160 (H)  70 - 99 mg/dL Final   Glucose reference range applies only to samples taken after fasting for at least 8 hours.    Allergies: Patient has no known allergies.  PTA Medications: (Not in a hospital admission)   Medical Decision Making  Continuous assessment We will start home medications where appropriate -Orders placed for Clazaril  level, CBC CMP and Depakote level -Psychiatrist/pharmacist to restart Clozaril 25 mg pending lab results    Recommendations  Based on my evaluation the patient does not appear to have an emergency medical condition.  Derrill Center, NP 02/08/22  3:53 PM

## 2022-02-08 NOTE — ED Notes (Signed)
Admitted to unit, denies SI/HI/AVH at present, provider made aware of BG reading. Oriented patient to unit and dinner provided.

## 2022-02-08 NOTE — ED Notes (Signed)
Patient Mitchell [name]. NP notified and lab verification ordered. Patient alert and denies any s/s of any pain/discomfort. Patient given 11 units of insulin per NP order and scheduled metformin. Patient in no s/s of current distress.

## 2022-02-08 NOTE — ED Notes (Signed)
Pt sleeping at present, no distress noted.  Monitoring for safety. 

## 2022-02-08 NOTE — ED Notes (Signed)
Pt had bedtime snack. 

## 2022-02-08 NOTE — ED Notes (Signed)
CMP  added to previous labs that were drawn.

## 2022-02-08 NOTE — ED Notes (Signed)
Pt is in the bed resting. Respirations are even and unlabored. No acute distress noted. Will continue to monitor for safety 

## 2022-02-09 ENCOUNTER — Encounter (HOSPITAL_COMMUNITY): Payer: Self-pay | Admitting: Registered Nurse

## 2022-02-09 DIAGNOSIS — F259 Schizoaffective disorder, unspecified: Secondary | ICD-10-CM | POA: Diagnosis not present

## 2022-02-09 DIAGNOSIS — F209 Schizophrenia, unspecified: Secondary | ICD-10-CM | POA: Diagnosis not present

## 2022-02-09 LAB — CLOZAPINE (CLOZARIL)
Clozapine Lvl: <20 ng/mL — ABNORMAL LOW (ref 350–600)
NorClozapine: <20 ng/mL
Total(Cloz+Norcloz): <40 ng/mL

## 2022-02-09 LAB — GLUCOSE, CAPILLARY
Glucose-Capillary: 311 mg/dL — ABNORMAL HIGH (ref 70–99)
Glucose-Capillary: 189 mg/dL — ABNORMAL HIGH (ref 70–99)

## 2022-02-09 NOTE — ED Notes (Signed)
Pt laying in bed quietly. No acute distress noted. Denies concerns. Received 2 units Novolog insulin injection to L lower abd without difficulty. FSBS 189. Informed pt to notify staff with any needs or concerns. Will continue to monitor for safety.

## 2022-02-09 NOTE — ED Provider Notes (Signed)
FBC/OBS ASAP Discharge Summary  Date and Time: 02/09/2022 12:01 PM  Name: Mitchell [name]  MRN:  [id number]   Discharge Diagnoses:  Final diagnoses:  Schizophrenia, unspecified type [facility])    Subjective: "I'm Fine"  HPI: Mitchell [name] 61 year old male presented to [facility] via [facility] [employer] with complaints that patient was found in apartment building stating he owns a building.  Mitchell Drought, NP chart review noted patient has a baseline delusional and a history of schizophrenia  Mitchell [name], 61 y.o., male patient seen face to face by this provider, consulted with Dr. Nelly Rout; and chart reviewed on 02/09/22.  On evaluation Mitchell [name] reports he feels fine and that he wants to go back to the group home.  He is unable to give the information on where he was after his discharge from the hospital ED on 02/04/22 but states that the police [place] him [place] the bus depot.  He doesn't know who picked him up at the hospital or where he went.  He denies suicidal ideation, psychosis, and paranoia. He reports that he is sleeping and eating with out difficulty.  States he has taken his medications as prescribed and no adverse reaction.   Patient has movement of his lips like rolling inward.  States "I done this since I was a child.  Its a habit.  Patient was able to control the movement.  He was asked to open mouth, there was no movement of tongue in mouth or when asked to stick out.  He denies the grinding of teeth or any other abnormal movement.   During evaluation Mitchell [name] is lying in bed with no noted distress.  He is alert, oriented x 4, calm, cooperative and attentive.  He was able to give the correct information for current place, month, year, city, state, county, current president.  He was able to give Obama name but couldn't remember Trump.  He wasn't able to give the day of the week.  His mood is euthymic with congruent affect.  He has normal speech, and behavior.  Objectively  there is no evidence of psychosis/mania or delusional thinking.  Patient is able to converse coherently, goal directed thoughts, no distractibility, or pre-occupation.  He denies suicidal/self-harm/homicidal ideation, psychosis, and paranoia.  He responded appropriately to questions.   Patient has outpatient psychiatric services with [facility] (medication management).  Patient can follow up with outpatient provider for further medication changes.   Collateral Information:  Spoke to group homeowner Mitchell [name] and informed that patient was ready for discharged.  Ms. Chaya Jan wanted to know what condition the patient was in prior to agreeing that he could come back to group home.  "When I saw him yesterday, he wasn't in the same state he was in prior to leaving and going to the hospital.  I'm not worried about if he is suicidal/homicidal or any of that stuff.  When I came to accept him yesterday, he had been missing.  I sent him to the hospital for leg cramps and at no time did the hospital call to get a list of medications or to speak with anyone at the group home.  I don't know what happened usually the ED will reach out.  Since I hadn't received a call, I called the hospital on Saturday 02/06/22 and they kept me on hold for 45 minutes, no lie and when they came back, they told me he was discharged.  I asked who he was discharged  to and tried to resolve the problem and asked why the group home wasn't called.  So, no one had called to get a list of his medications, and no one reached out when he was discharged.  Now he had been missing since the day he was discharged from the hospital.  When I say him yesterday 02/08/22 my concern was to make sure he was in the state he left from my house in.  But when I talked to him, he was all confused, he told me something about an apartment in [place], he had never said anything about that before.  When he talked, he had an involuntary movement with his mouth and  tongue, you know like when someone has some candy or something in mouth and he was talking with tongue constantly moving.  He wasn't like that before.  When he left my house, he was perfectly normal not like he was when I saw him yesterday.  I just want to make sure he is back to himself and that he is stable.  Nobody still hasn't called me to get a list of his medications.  I don't know what he is taking or when the last time he took his medications. Mitchell [name] informed that medications had been started and that we could go over the list of medications.  Mitchell [name] states that she will [facility] the [facility] and go over them then and that she wants to make sure that patient is stable and in the same condition he was prior to leaving her home.  "I didn't say he couldn't come back.  I just want to make sure he is stable and in the same condition he was in prior to leaving my home."    Stay Summary: Mitchell [name] was admitted for Schizophrenia, unspecified [facility](HCC), crisis management, safety, and stabilization.  Medical problems were identified and treated as needed.  Home medications were restarted, adjusted, or new medications added as needed or appropriate.  Medications treated with during admission are as follows.  cloNIDine  0.1 mg Oral BID   haloperidol  5 mg Oral BID   insulin aspart  0-5 Units Subcutaneous QHS   insulin aspart  0-9 Units Subcutaneous TID WC   insulin glargine-yfgn  15 Units Subcutaneous QHS   metFORMIN  1,000 mg Oral BID WC    Labs ordered for review: Lab Orders         Resp Panel by RT-PCR (Flu A&B, Covid) Anterior Nasal Swab         CBC with Differential/Platelet         Clozapine (clozaril)         Valproic acid level         Glucose, capillary         Comprehensive metabolic panel         Glucose, capillary         Glucose, capillary         POCT Urine Drug Screen - (I-Screen)         POC SARS Coronavirus 2 Ag     Improvement was monitored by  observation and Mitchell [name] 's verbal report emotional status and symptom reduction along with clinical staff report.  Mitchell [name] was evaluated by the treatment team for stability and plans for continued recovery upon discharge. Mitchell [name] 's motivation was an integral factor for scheduling further treatment. Employment, transportation, bed availability, health status, family  support, and any pending legal issues were also considered during stay. He was offered further treatment options upon discharge including but not limited to Residential, Intensive Outpatient, and Outpatient treatment.   Mitchell [name] will follow up with  Follow-up Information     Call  Center, [facility].   Why: Call to schedule appointment for follow up after hospital discharged for medication management with in the next 7-14 days Contact information: [address] [place] [address] [phone]                Discharge Instructions   None    Upon completion of this admission DAVEON ARPINO was both mentally and medically stable for discharge denying suicidal/homicidal ideation, auditory/visual/tactile hallucinations, delusional thoughts, and paranoia.    Total Time spent with patient: 1 hour  Past Psychiatric History: Schizophrenia, baseline delusional thinking Past Medical History:  Past Medical History:  Diagnosis Date   Bipolar affective disorder (HCC)    takes Zyprexa daily   Coarse tremors 10/02/2014   Diabetes mellitus    takes Victoza,Metformin,and Glipizide daily   Hypertension    takes Amlodipine,Lisinopril and Clonidine daily   Hyponatremia    history of   Lithium toxicity 10/02/2014   Mental disorder    takes Lithium daily   Schizoaffective disorder    takes Trazodone nightly   Schizoaffective disorder (HCC) 01/04/2019   Seasonal allergies    takes Claritin daily   Sleep apnea    sleep study >66yrs ago   Stroke [facility])    left arm weakness    Past Surgical  History:  Procedure Laterality Date   CATARACT EXTRACTION W/PHACO Right 02/14/2013   Procedure: CATARACT EXTRACTION PHACO AND INTRAOCULAR LENS PLACEMENT (IOC);  Surgeon: Shade Flood, MD;  [facility] OR;  Service: Ophthalmology;  Laterality: Right;   CATARACT EXTRACTION W/PHACO Left 06/13/2013   Procedure: CATARACT EXTRACTION PHACO AND INTRAOCULAR LENS PLACEMENT (IOC);  Surgeon: Shade Flood, MD;  Location: [facility] OR;  Service: Ophthalmology;  Laterality: Left;   CIRCUMCISION  20 yrs. ago   EYE SURGERY     Family History: History reviewed. No pertinent family history. Family Psychiatric History: None reported Social History:  Social History   Substance and Sexual Activity  Alcohol Use Not Currently     Social History   Substance and Sexual Activity  Drug Use No    Social History   Socioeconomic History   Marital status: Divorced    Spouse name: Not on file   Number of children: Not on file   Years of education: Not on file   Highest education level: Not on file  Occupational History   Not on file  Tobacco Use   Smoking status: Never   Smokeless tobacco: Never  Vaping Use   Vaping Use: Never used  Substance and Sexual Activity   Alcohol use: Not Currently   Drug use: No   Sexual activity: Yes    Birth control/protection: None  Other Topics Concern   Not on file  Social History Narrative   Not on file   Social Determinants of Health   Financial Resource Strain: Not on file  Food Insecurity: Not on file  Transportation Needs: Not on file  Physical Activity: Not on file  Stress: Not on file  Social Connections: Not on file   SDOH:  SDOH Screenings   Alcohol Screen: Low Risk  (04/23/2021)  Depression (PHQ2-9): Low Risk  (04/16/2021)  Tobacco Use: Low Risk  (02/09/2022)  Tobacco Cessation:  N/A, patient does not currently use tobacco products  Current Medications:  Current Facility-Administered Medications  Medication Dose Route Frequency Provider Last Rate  Last Admin   acetaminophen (TYLENOL) tablet 650 mg  650 mg Oral Q6H PRN Oneta Rack, NP       alum & mag hydroxide-simeth (MAALOX/MYLANTA) 200-200-20 MG/5ML suspension 30 mL  30 mL Oral Q4H PRN Oneta Rack, NP       cloNIDine (CATAPRES) tablet 0.1 mg  0.1 mg Oral BID Oneta Rack, NP   0.1 mg at 02/09/22 0944   haloperidol (HALDOL) tablet 5 mg  5 mg Oral BID Oneta Rack, NP   5 mg at 02/09/22 0944   insulin aspart (novoLOG) injection 0-5 Units  0-5 Units Subcutaneous QHS Oneta Rack, NP       insulin aspart (novoLOG) injection 0-9 Units  0-9 Units Subcutaneous TID WC Oneta Rack, NP   7 Units at 02/09/22 0748   insulin glargine-yfgn (SEMGLEE) injection 15 Units  15 Units Subcutaneous QHS Oneta Rack, NP   15 Units at 02/08/22 2256   magnesium hydroxide (MILK OF MAGNESIA) suspension 30 mL  30 mL Oral Daily PRN Oneta Rack, NP       metFORMIN (GLUCOPHAGE) tablet 1,000 mg  1,000 mg Oral BID WC Oneta Rack, NP   1,000 mg at 02/09/22 0747   Current Outpatient Medications  Medication Sig Dispense Refill   aspirin EC 81 MG tablet Take 1 tablet (81 mg total) by mouth daily. Swallow whole. 30 tablet 0   cloNIDine (CATAPRES) 0.2 MG tablet Take 1 tablet (0.2 mg total) by mouth 2 (two) times daily. 60 tablet 0   cloZAPine (CLOZARIL) 25 MG tablet Take 1-3 tablets (25-75 mg total) by mouth See admin instructions. Taking 25 mg in the AM and 75 mg at bedtime 90 tablet 0   donepezil (ARICEPT) 5 MG tablet Take 1 tablet (5 mg total) by mouth at bedtime. 30 tablet 0   haloperidol (HALDOL) 5 MG tablet Take 1 tablet (5 mg total) by mouth 2 (two) times daily.     insulin glargine (LANTUS) 100 UNIT/ML injection Inject 0.15 mLs (15 Units total) into the skin at bedtime. 4.5 mL 0   metFORMIN (GLUCOPHAGE) 1000 MG tablet Take 1 tablet (1,000 mg total) by mouth 2 (two) times daily. 60 tablet 0   senna (SENOKOT) 8.6 MG TABS tablet Take 2 tablets (17.2 mg total) by mouth daily. 120 tablet 0     PTA Medications: (Not in a hospital admission)      04/16/2021    6:05 PM 12/30/2015   10:11 AM  Depression screen PHQ 2/9  Decreased Interest 0 0  Down, Depressed, Hopeless 0 0  PHQ - 2 Score 0 0  Altered sleeping 0   Tired, decreased energy 0   Change in appetite 0   Feeling bad or failure about yourself  0   Trouble concentrating 0   Moving slowly or fidgety/restless 0   Suicidal thoughts 0   PHQ-9 Score 0   Difficult doing work/chores Not difficult at all     Flowsheet Row ED from 02/03/2022 in [facility] EMERGENCY DEPARTMENT ED from 02/02/2022 in [facility] EMERGENCY DEPARTMENT ED from 01/31/2022 in [facility] EMERGENCY DEPARTMENT  C-SSRS RISK CATEGORY No Risk No Risk No Risk       Musculoskeletal  Strength & Muscle Tone: within normal limits Gait &  Station: normal Patient leans: N/A  Psychiatric Specialty Exam  Presentation  General Appearance:  Appropriate for Environment  Eye Contact: Good  Speech: Clear and Coherent; Normal Rate  Speech Volume: Normal  Handedness: Right   Mood and Affect  Mood: Euthymic  Affect: Appropriate; Congruent   Thought Process  Thought Processes: Coherent  Descriptions of Associations:Loose  Orientation:Full (Time, Place and Person)  Thought Content:Logical  Diagnosis of Schizophrenia or Schizoaffective disorder in past: Yes  Duration of Psychotic Symptoms: Greater than six months   Hallucinations:Hallucinations: None  Ideas of Reference:None  Suicidal Thoughts:Suicidal Thoughts: No  Homicidal Thoughts:Homicidal Thoughts: No   Sensorium  Memory: Immediate Fair; Recent Fair  Judgment: Fair  Insight: Present; Fair   Art therapist  Concentration: Fair  Attention Span: Fair  Recall: [employer] of Knowledge: Fair  Language: Fair   Psychomotor Activity  Psychomotor Activity: Psychomotor Activity: Normal   Assets   Assets: Manufacturing systems engineer; Desire for Improvement; Financial Resources/Insurance; Housing; Social Support   Sleep  Sleep: Sleep: Good   Nutritional Assessment (For OBS and FBC admissions only) Has the patient had a weight loss or gain of 10 pounds or more in the last 3 months?: No Has the patient had a decrease in food intake/or appetite?: No Does the patient have dental problems?: No Does the patient have eating habits or behaviors that may be indicators of an eating disorder including binging or inducing vomiting?: No Has the patient recently lost weight without trying?: 0 Has the patient been eating poorly because of a decreased appetite?: 0 Malnutrition Screening Tool Score: 0    Physical Exam  Physical Exam Vitals and nursing note reviewed. Exam conducted with a chaperone present.  Constitutional:      General: He is not in acute distress.    Appearance: Normal appearance. He is not ill-appearing.  HENT:     Head: Normocephalic.  Cardiovascular:     Rate and Rhythm: Normal rate.  Pulmonary:     Effort: Pulmonary effort is normal.  Musculoskeletal:        General: Normal range of motion.     Cervical back: Normal range of motion.  Skin:    General: Skin is warm and dry.  Neurological:     Mental Status: He is alert and oriented to person, place, and time.  Psychiatric:        Attention and Perception: Attention and perception normal. He does not perceive visual hallucinations.        Mood and Affect: Mood and affect normal.        Speech: Speech normal.        Behavior: Behavior normal.        Thought Content: Thought content normal. Thought content is not paranoid or delusional. Thought content does not include homicidal or suicidal ideation.    Review of Systems  Constitutional:  Negative for chills, fever and weight loss.  Respiratory:  Negative for cough, shortness of breath and wheezing.   Cardiovascular:  Positive for palpitations. Negative for chest  pain and leg swelling.  Skin: Negative.   Psychiatric/Behavioral:  Positive for memory loss. Negative for substance abuse. Depression: Stable. Hallucinations: Denies. Suicidal ideas: Denies.Nervous/anxious: Denies.    Blood pressure 130/74, pulse 100, temperature 98.3 F (36.8 C), temperature source Oral, resp. rate 18, SpO2 98 %. There is no height or weight on file to calculate BMI.  Demographic Factors:  Male  Loss Factors: NA  Historical Factors: NA  Risk Reduction Factors:  Religious beliefs about death, Living with another person, especially a relative, Positive social support, and Positive therapeutic relationship  Continued Clinical Symptoms:  Previous Psychiatric Diagnoses and Treatments  Cognitive Features That Contribute To Risk:  None    Suicide Risk:  Minimal: No identifiable suicidal ideation.  Patients presenting with no risk factors but with morbid ruminations; may be classified as minimal risk based on the severity of the depressive symptoms  Plan Of Care/Follow-up recommendations:  Other:  Follow up with [facility] for psychiatric services  Disposition: No evidence of imminent risk to self or others at present.   Patient does not meet criteria for psychiatric inpatient admission. Supportive therapy provided about ongoing stressors. Discussed crisis plan, support from social network, calling 911, coming to the Emergency Department, and calling Suicide Hotline.  Ralston Venus, NP 02/09/2022, 12:01 PM

## 2022-02-09 NOTE — ED Notes (Signed)
Patient A&O x 4, ambulatory. Patient discharged in no acute distress. Patient denied SI/HI, A/VH upon discharge. Patient verbalized understanding of all discharge instructions explained by staff, to include follow up appointments, and safety plan. Patient reported mood 10/10.  Pt belongings returned to patient from locker #  13 intact. Patient escorted to lobby via staff for transport to destination. Safety maintained.

## 2022-02-09 NOTE — Progress Notes (Addendum)
LCSW Progress Note   LCSW tasked to make a report to [facility] regarding the group home's unwillingness to have the pt return despite stabilization.  LCSW reached out to Loraine Leriche, LCSW, [facility] Supervisor for guidance.  1025 - LCSW was informed by Assunta Found, NP, that the pt's group home still refuses to take him back until he is completely stabilized.  1437 - LCSW contacted Malvin Johns @ [phone] to inform her that the pt has been discharged and is ready to be picked up.  Ms. Wallis Bamberg stated that she is on her way now with the medications to speak with NP Shuvon Rankin.  Ms. Wallis Bamberg was reminded that she was expected at 1200, but she stated she had an emergency at that time and could not make it.  Hansel Starling, MSW, LCSW [facility] [phone] phone [phone] fax

## 2022-02-09 NOTE — ED Notes (Signed)
Patient A&Ox4. Denies intent to harm self/others when asked. Denies A/VH. Patient denies any physical complaints when asked. No acute distress noted. Routine safety checks conducted according to facility protocol. Encouraged patient to notify staff if thoughts of harm toward self or others arise. Patient verbalize understanding and agreement. Will continue to monitor for safety.    

## 2022-02-09 NOTE — Inpatient Diabetes Management (Signed)
Inpatient Diabetes Program Recommendations  AACE/ADA: New Consensus Statement on Inpatient Glycemic Control (2015)  Target Ranges:  Prepandial:   less than 140 mg/dL      Peak postprandial:   less than 180 mg/dL (1-2 hours)      Critically ill patients:  140 - 180 mg/dL   Lab Results  Component Value Date   GLUCAP 189 (H) 02/09/2022   HGBA1C 9.5 (H) 01/06/2022    Review of Glycemic Control  Diabetes history: DM2 Outpatient Diabetes medications: Lantus 15 units QD, metformin 1000 mg BID Current orders for Inpatient glycemic control: Semglee 15 QHS, Novolog 0-9 TID with meals and 0-5 HS  HgbA1C - 9.5% CBGs 311, 189 mg/dL  Inpatient Diabetes Program Recommendations:    Increase Semglee to 20 units QHS  Continue to follow.  Thank you. Ailene Ards, RD, LDN, CDCES Inpatient Diabetes Coordinator [phone]

## 2022-02-09 NOTE — ED Notes (Signed)
Pt sleeping at present, no distress noted.  Monitoring for safety. 

## 2022-02-09 NOTE — ED Notes (Signed)
Pt sleeping in no acute distress. RR even and unlabored. Safety maintained. 

## 2022-02-10 NOTE — Progress Notes (Deleted)
[facility] 11/13:Mitchell [name] 61 year old male presented to [facility] via [employer] with complaints that patient was found in apartment building stating he owns a building.  Alcario Drought, NP chart review noted patient has a baseline delusional and a history of schizophrenia   Asa 81  HTN Clonidine 0.2 BID  OSA CPAP?  Schizophrenia Clozapine 25 mg AM + 75 mg night  T2DM No show recent podiatry appointment Metformin 1000 BID Glargine 15u nightly   HCM Foot Optho Microalbumin colon

## 2022-03-22 ENCOUNTER — Other Ambulatory Visit: Payer: Self-pay

## 2022-03-22 ENCOUNTER — Emergency Department (HOSPITAL_COMMUNITY)
Admission: EM | Admit: 2022-03-22 | Discharge: 2022-03-23 | Discharge status: Against Medical Advice | Payer: Medicare Other | Attending: Emergency Medicine | Admitting: Emergency Medicine

## 2022-03-22 ENCOUNTER — Emergency Department (HOSPITAL_COMMUNITY): Payer: Medicare Other

## 2022-03-22 DIAGNOSIS — Z5321 Procedure and treatment not carried out due to patient leaving prior to being seen by health care provider: Secondary | ICD-10-CM | POA: Diagnosis not present

## 2022-03-22 DIAGNOSIS — S60943A Unspecified superficial injury of left middle finger, initial encounter: Secondary | ICD-10-CM | POA: Diagnosis present

## 2022-03-22 DIAGNOSIS — W19XXXA Unspecified fall, initial encounter: Secondary | ICD-10-CM | POA: Insufficient documentation

## 2022-03-22 LAB — CBG MONITORING, ED: Glucose-Capillary: 332 mg/dL — ABNORMAL HIGH (ref 70–99)

## 2022-03-22 NOTE — ED Triage Notes (Signed)
Patient injured his left middle finger this evening , lost his balance while tying his shoes and fell backwards , presents with left middle finger swelling , hyperglycemic at triage he has not taken his night time insulin .

## 2022-03-22 NOTE — ED Provider Triage Note (Signed)
Emergency Medicine Provider Triage Evaluation Note  Mitchell [name] , a 61 y.o. male  was evaluated in triage.  Pt complains of injury to his left middle finger that occurred this evening.  He states he lost his balance while tying his shoes and fell backwards.  He presents with left middle finger swelling and is concerned it is broken.  He was also hyperglycemic in triage, but he has not taken his nighttime insulin.  Review of Systems  Positive: As above Negative: As above  Physical Exam  BP (!) 149/84   Pulse 84   Temp 98.2 F (36.8 C)   Resp 18   SpO2 97%  Gen:   Awake, no distress   Resp:  Normal effort  MSK:   Moves extremities without difficulty, left middle finger has dried blood around nail, is swollen with limited ROM  Other:    Medical Decision Making  Medically screening exam initiated at 9:09 PM.  Appropriate orders placed.  [name] was informed that the remainder of the evaluation will be completed by another provider, this initial triage assessment does not replace that evaluation, and the importance of remaining in the ED until their evaluation is complete.     [name], [place] 03/22/22 2113

## 2022-03-23 NOTE — ED Notes (Signed)
Pt called for vitals recheck x2 with no answer.  

## 2022-03-24 ENCOUNTER — Emergency Department (HOSPITAL_COMMUNITY): Payer: Medicare Other

## 2022-03-24 ENCOUNTER — Emergency Department (HOSPITAL_COMMUNITY)
Admission: EM | Admit: 2022-03-24 | Discharge: 2022-03-24 | Disposition: A | Discharge status: Home / Self Care | Payer: Medicare Other | Attending: Emergency Medicine | Admitting: Emergency Medicine

## 2022-03-24 ENCOUNTER — Other Ambulatory Visit: Payer: Self-pay

## 2022-03-24 DIAGNOSIS — S63253A Unspecified dislocation of left middle finger, initial encounter: Secondary | ICD-10-CM | POA: Insufficient documentation

## 2022-03-24 DIAGNOSIS — W19XXXA Unspecified fall, initial encounter: Secondary | ICD-10-CM | POA: Diagnosis not present

## 2022-03-24 DIAGNOSIS — Z7984 Long term (current) use of oral hypoglycemic drugs: Secondary | ICD-10-CM | POA: Insufficient documentation

## 2022-03-24 DIAGNOSIS — S63259A Unspecified dislocation of unspecified finger, initial encounter: Secondary | ICD-10-CM

## 2022-03-24 DIAGNOSIS — Z7982 Long term (current) use of aspirin: Secondary | ICD-10-CM | POA: Insufficient documentation

## 2022-03-24 DIAGNOSIS — Z794 Long term (current) use of insulin: Secondary | ICD-10-CM | POA: Insufficient documentation

## 2022-03-24 DIAGNOSIS — S6992XA Unspecified injury of left wrist, hand and finger(s), initial encounter: Secondary | ICD-10-CM | POA: Diagnosis present

## 2022-03-24 MED ORDER — ACETAMINOPHEN 500 MG PO TABS
1000.0000 mg | ORAL_TABLET | Freq: Once | ORAL | Status: AC
  Administered 2022-03-24: 1000 mg via ORAL
  Filled 2022-03-24: qty 2

## 2022-03-24 NOTE — ED Provider Notes (Signed)
[facility] EMERGENCY DEPARTMENT Provider Note   CSN: [id number] Arrival date & time: 03/24/22  1658     History  Chief Complaint  Patient presents with   Finger Injury    Mitchell [name] is a 61 y.o. male.  HPI Patient reports he had his foot up on a wall trying to tie his shoes.  It caused him to lose his balance and he fell backwards and then caught himself with his outstretched hands.  He reports that it caused his left middle finger to get jammed.  Denies any other injury.  He reports since then has been swollen and painful.  He cannot bend the left finger anymore either.  He came to the emergency department 2 days ago but due to the long wait times left before being seen.    Home Medications Prior to Admission medications   Medication Sig Start Date End Date Taking? Authorizing Provider  aspirin EC 81 MG tablet Take 1 tablet (81 mg total) by mouth daily. Swallow whole. 02/04/22   Jacalyn Lefevre, MD  cloNIDine (CATAPRES) 0.2 MG tablet Take 1 tablet (0.2 mg total) by mouth 2 (two) times daily. 02/04/22   Jacalyn Lefevre, MD  cloZAPine (CLOZARIL) 25 MG tablet Take 1-3 tablets (25-75 mg total) by mouth See admin instructions. Taking 25 mg in the AM and 75 mg at bedtime 02/04/22 03/06/22  Jacalyn Lefevre, MD  donepezil (ARICEPT) 5 MG tablet Take 1 tablet (5 mg total) by mouth at bedtime. 05/27/21 02/09/22  Lauro Franklin, MD  haloperidol (HALDOL) 5 MG tablet Take 1 tablet (5 mg total) by mouth 2 (two) times daily. 02/04/22 03/06/22  Jacalyn Lefevre, MD  insulin glargine (LANTUS) 100 UNIT/ML injection Inject 0.15 mLs (15 Units total) into the skin at bedtime. 02/04/22 03/06/22  Jacalyn Lefevre, MD  metFORMIN (GLUCOPHAGE) 1000 MG tablet Take 1 tablet (1,000 mg total) by mouth 2 (two) times daily. 02/04/22   Jacalyn Lefevre, MD  senna (SENOKOT) 8.6 MG TABS tablet Take 2 tablets (17.2 mg total) by mouth daily. 05/28/21   Lauro Franklin, MD      Allergies    Patient  has no known allergies.    Review of Systems   Review of Systems  Physical Exam Updated Vital Signs BP (!) 178/97 (BP Location: Right Arm)   Pulse 98   Temp 98.8 F (37.1 C) (Oral)   Resp 18   SpO2 97%  Physical Exam Constitutional:      Comments: Alert, nontoxic, no acute distress.  Eyes:     Extraocular Movements: Extraocular movements intact.  Pulmonary:     Effort: Pulmonary effort is normal.  Musculoskeletal:     Comments: Diffuse swelling of the third middle digit.  This includes ecchymotic bruising on the palmar aspect from the middle interphalangeal joint to the MCP.  Finger is warm to the touch.  Brisk cap refill.  Skin:    General: Skin is warm and dry.  Neurological:     General: No focal deficit present.  Psychiatric:        Mood and Affect: Mood normal.     ED Results / Procedures / Treatments   Labs (all labs ordered are listed, but only abnormal results are displayed) Labs Reviewed - No data to display  EKG None  Radiology DG Finger Middle Left  Result Date: 03/24/2022 CLINICAL DATA:  Postreduction. EXAM: LEFT MIDDLE FINGER 2+V COMPARISON:  Earlier radiograph dated 03/22/2022. FINDINGS: There is no acute fracture or  dislocation. The bones are mineralized. No arthritic changes. Mild soft tissue swelling of the middle finger. No radiopaque foreign object or soft tissue gas. IMPRESSION: Successful reduction of the middle finger dislocation. Electronically Signed   By: Elgie Collard M.D.   On: 03/24/2022 21:26    Procedures .Ortho Injury Treatment  Date/Time: 03/24/2022 9:42 PM  Performed by: Arby Barrette, MD Authorized by: Arby Barrette, MD   Consent:    Consent obtained:  Verbal   Consent given by:  Patient   Risks discussed:  Restricted joint movement, stiffness, recurrent dislocation and irreducible dislocation   Alternatives discussed:  Alternative treatmentInjury location: finger Location details: left long finger Injury type:  dislocation Dislocation type: PIP Pre-procedure neurovascular assessment: neurovascularly intact Pre-procedure distal perfusion: normal Pre-procedure neurological function: normal Pre-procedure range of motion: reduced  Anesthesia: Local anesthesia used: no  Patient sedated: NoManipulation performed: yes Reduction successful: yes X-ray confirmed reduction: yes Immobilization: splint Splint type: static finger Splint Applied by: ED Nurse Supplies used: aluminum splint Post-procedure neurovascular assessment: post-procedure neurovascularly intact Post-procedure distal perfusion: normal Post-procedure neurological function: normal Post-procedure range of motion: improved       Medications Ordered in ED Medications  acetaminophen (TYLENOL) tablet 1,000 mg (1,000 mg Oral Given 03/24/22 2124)    ED Course/ Medical Decision Making/ A&P                           Medical Decision Making Amount and/or Complexity of Data Reviewed Radiology: ordered.   Patient had mechanical fall creating dislocation of the third finger on the left.  No other injury.  X-rays personally reviewed by myself from 12\25.  dorsal dislocation of proximal interphalangeal joint left digit 3.  Patient agreed to reduction without anesthesia.  Finger was easily reduced with 1 traction maneuver.  I have personally reviewed the postreduction images at bedside.  3 views reviewed and successfully relocated.  Static buddy tape finger splint applied.  Patient is in good condition.  Plan will be for discharge with follow-up with orthopedics for recheck.  Patient counseled there is possibility of ligamentous and tendinous injury secondary to the location that could permanently decreased function.  Patient highly recommended follow-up with orthopedics for recheck.         Final Clinical Impression(s) / ED Diagnoses Final diagnoses:  Dislocation of finger, initial encounter    Rx / DC Orders ED  Discharge Orders     None         Arby Barrette, MD 03/24/22 2146

## 2022-03-24 NOTE — ED Provider Triage Note (Signed)
Emergency Medicine Provider Triage Evaluation Note  Mitchell [name] , a 61 y.o. male  was evaluated in triage.  Pt complains of finger and hand swelling.  Patient returns to ED today, was evaluated on 03/22/22 for finger injury. X-ray imaging was done on that date and showed a dorsal dislocation of the third proximal IPJ.  No fracture.   Review of Systems  Positive: As above Negative: As above  Physical Exam  BP (!) 178/97 (BP Location: Right Arm)   Pulse 98   Temp 98.8 F (37.1 C) (Oral)   Resp 18   SpO2 97%  Gen:   Awake, no distress   Resp:  Normal effort  MSK:   Moves extremities without difficulty, unable to bend left middle finger, swelling to finger that extends into hand Other:    Medical Decision Making  Medically screening exam initiated at 8:39 PM.  Appropriate orders placed.  Mitchell [name] was informed that the remainder of the evaluation will be completed by another provider, this initial triage assessment does not replace that evaluation, and the importance of remaining in the ED until their evaluation is complete.     Mitchell [name], [place] 03/24/22 2041

## 2022-03-24 NOTE — ED Notes (Signed)
All discharge instructions reviewed with patient including follow up care and prescriptions. Patient verbalized understanding of same and had no other questions. Patient stable and ambulatory at time of discharge.  

## 2022-03-24 NOTE — ED Triage Notes (Signed)
Patient reports pain with swelling at left middle finger injured 2 days ago when he fell . Hypertensive at triage .

## 2022-03-24 NOTE — Discharge Instructions (Signed)
1.  Your finger was dislocated.  It has been pulled back into place.  There may still be significant injury to the ligaments and tendons that support the finger.  You have been temporarily placed in a finger splint to help stabilize it. 2.  Elevate your hand is much as possible and apply well wrapped ice pack to the swollen finger.  You may let the ice set in place for about 10 minutes. Be sure the ice pack is well wrapped and do not create injury to your skin from excessive cold exposure. 3.  You may take extra strength Tylenol every 6 hours to help with pain. 4.  Schedule a recheck with the hand specialist.  Dr. Mina Marble is on-call for the evening that you visited the emergency department.  Contact the office for recheck within 3 to 7 days.

## 2022-03-27 ENCOUNTER — Emergency Department (HOSPITAL_COMMUNITY)
Admission: EM | Admit: 2022-03-27 | Discharge: 2022-03-27 | Disposition: A | Discharge status: Home / Self Care | Payer: Medicare Other | Attending: Emergency Medicine | Admitting: Emergency Medicine

## 2022-03-27 ENCOUNTER — Encounter (HOSPITAL_COMMUNITY): Payer: Self-pay | Admitting: *Deleted

## 2022-03-27 ENCOUNTER — Other Ambulatory Visit: Payer: Self-pay

## 2022-03-27 DIAGNOSIS — X58XXXA Exposure to other specified factors, initial encounter: Secondary | ICD-10-CM | POA: Insufficient documentation

## 2022-03-27 DIAGNOSIS — E119 Type 2 diabetes mellitus without complications: Secondary | ICD-10-CM | POA: Insufficient documentation

## 2022-03-27 DIAGNOSIS — S60032A Contusion of left middle finger without damage to nail, initial encounter: Secondary | ICD-10-CM | POA: Diagnosis not present

## 2022-03-27 DIAGNOSIS — Z794 Long term (current) use of insulin: Secondary | ICD-10-CM | POA: Diagnosis not present

## 2022-03-27 DIAGNOSIS — M79645 Pain in left finger(s): Secondary | ICD-10-CM

## 2022-03-27 DIAGNOSIS — Z7984 Long term (current) use of oral hypoglycemic drugs: Secondary | ICD-10-CM | POA: Diagnosis not present

## 2022-03-27 DIAGNOSIS — Z7982 Long term (current) use of aspirin: Secondary | ICD-10-CM | POA: Insufficient documentation

## 2022-03-27 DIAGNOSIS — I10 Essential (primary) hypertension: Secondary | ICD-10-CM | POA: Diagnosis not present

## 2022-03-27 LAB — CBG MONITORING, ED
Glucose-Capillary: 237 mg/dL — ABNORMAL HIGH (ref 70–99)
Glucose-Capillary: 262 mg/dL — ABNORMAL HIGH (ref 70–99)

## 2022-03-27 MED ORDER — CLONIDINE HCL 0.2 MG PO TABS: 0.2000 mg | ORAL_TABLET | Freq: Two times a day (BID) | ORAL | 2 refills | Status: DC

## 2022-03-27 NOTE — Discharge Instructions (Signed)
Please read and follow all provided instructions.  Your diagnoses today include:  1. Pain of finger of left hand   2. Uncontrolled hypertension     Your blood pressure was high today (BP): BP (!) 181/96   Pulse (!) 109   Temp 97.7 F (36.5 C) (Oral)   Resp 18   Ht 5\' 4"  (1.626 m)   Wt 95.3 kg   SpO2 97%   BMI 36.06 kg/m   Tests performed today include: Vital signs. See below for your results today.   Medications prescribed:  Clonidine: Medication for blood pressure  Home care instructions:  Follow any educational materials contained in this packet.  Follow-up instructions: Please follow-up with your primary care provider in the next 7 days for a recheck of your symptoms and blood pressure.    In regards to your finger injury, please call the office of the provider given to you at last visit.  If you have not heard from them by early next week, please give the office another call.  Wear splint until cleared by orthopedics.  Return instructions:  Please return to the Emergency Department if you experience worsening symptoms.  Return with severe chest pain, abdominal pain, or shortness of breath.  Return with severe headache, focal weakness, numbness, difficulty with speech or vision. Return with loss of vision or sudden vision change. Please return if you have any other emergent concerns.  Additional Information:  Your vital signs today were: BP (!) 181/96   Pulse (!) 109   Temp 97.7 F (36.5 C) (Oral)   Resp 18   Ht 5\' 4"  (1.626 m)   Wt 95.3 kg   SpO2 97%   BMI 36.06 kg/m  If your blood pressure (BP) was elevated above 135/85 this visit, please have this repeated by your doctor within one month. --------------

## 2022-03-27 NOTE — ED Triage Notes (Signed)
Lt hand bandaged from a dislocated lt middle finger 3 days ago  he was here last night also for the same

## 2022-03-27 NOTE — ED Provider Notes (Signed)
[facility] EMERGENCY DEPARTMENT Provider Note   CSN: [id number] Arrival date & time: 03/27/22  5009     History  Chief Complaint  Patient presents with   Hand Pain    Mitchell [name] is a 61 y.o. male.  Patient presents to the emergency department for reevaluation of hand pain and high blood pressure.  Patient has a history of schizophrenia, hypertension, diabetes.  Patient was seen in the emergency department on 12/27 for an injury to his left middle finger.  This was found to have a dislocation which was reduced in the emergency department.  Patient was placed in a splint.  He replaced this at home with tape around the finger and hand.  States that he just wanted to get it rechecked to see if he can have the bandage taken off.  No other complaints.  States that he took his last blood pressure medication yesterday.  No chest pain, shortness of breath, leg swelling.  He was prescribed clonidine at last ED visit.       Home Medications Prior to Admission medications   Medication Sig Start Date End Date Taking? Authorizing Provider  aspirin EC 81 MG tablet Take 1 tablet (81 mg total) by mouth daily. Swallow whole. 02/04/22   Jacalyn Lefevre, MD  cloNIDine (CATAPRES) 0.2 MG tablet Take 1 tablet (0.2 mg total) by mouth 2 (two) times daily. 03/27/22   Renne Crigler, PA-C  cloZAPine (CLOZARIL) 25 MG tablet Take 1-3 tablets (25-75 mg total) by mouth See admin instructions. Taking 25 mg in the AM and 75 mg at bedtime 02/04/22 03/06/22  Jacalyn Lefevre, MD  donepezil (ARICEPT) 5 MG tablet Take 1 tablet (5 mg total) by mouth at bedtime. 05/27/21 02/09/22  Lauro Franklin, MD  haloperidol (HALDOL) 5 MG tablet Take 1 tablet (5 mg total) by mouth 2 (two) times daily. 02/04/22 03/06/22  Jacalyn Lefevre, MD  insulin glargine (LANTUS) 100 UNIT/ML injection Inject 0.15 mLs (15 Units total) into the skin at bedtime. 02/04/22 03/06/22  Jacalyn Lefevre, MD  metFORMIN (GLUCOPHAGE) 1000 MG  tablet Take 1 tablet (1,000 mg total) by mouth 2 (two) times daily. 02/04/22   Jacalyn Lefevre, MD  senna (SENOKOT) 8.6 MG TABS tablet Take 2 tablets (17.2 mg total) by mouth daily. 05/28/21   Lauro Franklin, MD      Allergies    Patient has no known allergies.    Review of Systems   Review of Systems  Physical Exam Updated Vital Signs BP (!) 181/96   Pulse (!) 109   Temp 97.7 F (36.5 C) (Oral)   Resp 18   Ht 5\' 4"  (1.626 m)   Wt 95.3 kg   SpO2 97%   BMI 36.06 kg/m  Physical Exam Vitals and nursing note reviewed.  Constitutional:      Appearance: He is well-developed.  HENT:     Head: Normocephalic and atraumatic.  Eyes:     Conjunctiva/sclera: Conjunctivae normal.  Pulmonary:     Effort: No respiratory distress.  Musculoskeletal:     Cervical back: Normal range of motion and neck supple.     Comments: Left hand: Patient with finger splint/buddy tape in place with very tight tape wrapping the finger and hand.  No signs of vascular compromise.  After removal of dressing and splint, left index finger with mild swelling and ecchymosis.  Capillary refill less than 2 seconds.  Hand is neurovascularly intact.  Skin:    General: Skin is warm  and dry.  Neurological:     Mental Status: He is alert.     ED Results / Procedures / Treatments   Labs (all labs ordered are listed, but only abnormal results are displayed) Labs Reviewed  CBG MONITORING, ED - Abnormal; Notable for the following components:      Result Value   Glucose-Capillary 237 (*)    All other components within normal limits  CBG MONITORING, ED - Abnormal; Notable for the following components:   Glucose-Capillary 262 (*)    All other components within normal limits    EKG None  Radiology No results found.  Procedures .Splint Application  Date/Time: 03/27/2022 5:47 AM  Performed by: Renne Crigler, PA-C Authorized by: Renne Crigler, PA-C   Consent:    Consent obtained:  Verbal   Consent  given by:  Patient   Risks discussed:  Discoloration, numbness, swelling and pain   Alternatives discussed:  No treatment Universal protocol:    Patient identity confirmed:  Verbally with patient and provided demographic data Pre-procedure details:    Distal neurologic exam:  Normal   Distal perfusion: brisk capillary refill   Procedure details:    Location:  Finger   Finger location:  L long finger   Splint type:  Finger   Attestation: Splint applied and adjusted personally by me   Post-procedure details:    Distal neurologic exam:  Normal   Distal perfusion: brisk capillary refill     Procedure completion:  Tolerated well, no immediate complications Comments:     Finger splint applied, wrapped lightly with koban. Distal CMS intact prior to and after placement.      Medications Ordered in ED Medications - No data to display  ED Course/ Medical Decision Making/ A&P    Patient seen and examined. History obtained directly from patient.  I reviewed previous x-ray and ED visit.  I personally remove the splint that was placed previously by patient.  This is too tight although no vascular compromise noted.  Labs/EKG: None ordered  Imaging: None ordered  Medications/Fluids: None ordered  Most recent vital signs reviewed and are as follows: BP (!) 181/96   Pulse (!) 109   Temp 97.7 F (36.5 C) (Oral)   Resp 18   Ht 5\' 4"  (1.626 m)   Wt 95.3 kg   SpO2 97%   BMI 36.06 kg/m   Initial impression: Finger injury recheck, hypertension  Home treatment plan: Continue splint, orthopedic follow-up.  Refill blood pressure medication.  Return instructions discussed with patient: New or worsening symptoms, other concerns.  Follow-up instructions discussed with patient: Follow-up with orthopedic referral given at last visit.  Patient states that he called office and is awaiting return call.  Encourage patient to call again if he does not hear from them after New Years.                            Medical Decision Making Risk Prescription drug management.   Patient is here for recheck of finger.  He has only had the splint on for 2 and half days.  Splint replaced.  Finger appears appropriately ecchymotic, no signs of ischemia or vascular compromise.  Hypertension, patient recently out of blood pressure medications.  His previous prescription was refilled.  Encouraged PCP follow-up.  Patient without headache, shortness of breath, stroke symptoms, or other evidence of endorgan damage.        Final Clinical Impression(s) / ED Diagnoses Final  diagnoses:  Pain of finger of left hand  Uncontrolled hypertension    Rx / DC Orders ED Discharge Orders          Ordered    cloNIDine (CATAPRES) 0.2 MG tablet  2 times daily        03/27/22 0542              Renne Crigler, PA-C 03/27/22 0551    Nira Conn, MD 03/27/22 [phone]

## 2022-04-07 ENCOUNTER — Ambulatory Visit (INDEPENDENT_AMBULATORY_CARE_PROVIDER_SITE_OTHER): Payer: Medicare Other | Admitting: Podiatry

## 2022-04-07 ENCOUNTER — Encounter: Payer: Self-pay | Admitting: Podiatry

## 2022-04-07 DIAGNOSIS — M79675 Pain in left toe(s): Secondary | ICD-10-CM | POA: Insufficient documentation

## 2022-04-07 DIAGNOSIS — M79674 Pain in right toe(s): Secondary | ICD-10-CM

## 2022-04-07 DIAGNOSIS — E119 Type 2 diabetes mellitus without complications: Secondary | ICD-10-CM | POA: Diagnosis not present

## 2022-04-07 DIAGNOSIS — B351 Tinea unguium: Secondary | ICD-10-CM | POA: Diagnosis not present

## 2022-04-07 NOTE — Progress Notes (Signed)
This patient returns to my office for at risk foot care.  This patient requires this care by a professional since this patient will be at risk due to having  diabetes.  This patient is unable to cut nails himself since the patient cannot reach his nails.These nails are painful walking and wearing shoes.  This patient presents for at risk foot care today.  General Appearance  Alert, conversant and in no acute stress.  Vascular  Dorsalis pedis and posterior tibial  pulses are palpable  bilaterally.  Capillary return is within normal limits  bilaterally. Temperature is within normal limits  bilaterally.  Neurologic  Senn-Weinstein monofilament wire test within normal limits  bilaterally. Muscle power within normal limits bilaterally.  Nails Thick disfigured discolored nails with subungual debris  from hallux to fifth toes bilaterally. No evidence of bacterial infection or drainage bilaterally.  Orthopedic  No limitations of motion  feet .  No crepitus or effusions noted.  No bony pathology or digital deformities noted.  Skin  normotropic skin with no porokeratosis noted bilaterally.  No signs of infections or ulcers noted.     Onychomycosis  Pain in right toes  Pain in left toes  Consent was obtained for treatment procedures.   Mechanical debridement of nails 1-5  bilaterally performed with a nail nipper.  Filed with dremel without incident.    Return office visit    4  months                  Told patient to return for periodic foot care and evaluation due to potential at risk complications.   Benjermin Korber DPM   

## 2022-04-14 ENCOUNTER — Ambulatory Visit (INDEPENDENT_AMBULATORY_CARE_PROVIDER_SITE_OTHER): Payer: 59 | Admitting: Family Medicine

## 2022-04-14 ENCOUNTER — Encounter: Payer: Self-pay | Admitting: Family Medicine

## 2022-04-14 VITALS — BP 112/70 | HR 75 | Ht 64.0 in | Wt 209.0 lb

## 2022-04-14 DIAGNOSIS — E785 Hyperlipidemia, unspecified: Secondary | ICD-10-CM

## 2022-04-14 DIAGNOSIS — Z1159 Encounter for screening for other viral diseases: Secondary | ICD-10-CM

## 2022-04-14 DIAGNOSIS — Z23 Encounter for immunization: Secondary | ICD-10-CM | POA: Diagnosis not present

## 2022-04-14 DIAGNOSIS — I10 Essential (primary) hypertension: Secondary | ICD-10-CM

## 2022-04-14 DIAGNOSIS — Z00121 Encounter for routine child health examination with abnormal findings: Secondary | ICD-10-CM | POA: Insufficient documentation

## 2022-04-14 DIAGNOSIS — Z114 Encounter for screening for human immunodeficiency virus [HIV]: Secondary | ICD-10-CM

## 2022-04-14 DIAGNOSIS — R7301 Impaired fasting glucose: Secondary | ICD-10-CM

## 2022-04-14 DIAGNOSIS — E039 Hypothyroidism, unspecified: Secondary | ICD-10-CM

## 2022-04-14 DIAGNOSIS — F209 Schizophrenia, unspecified: Secondary | ICD-10-CM

## 2022-04-14 DIAGNOSIS — Z0001 Encounter for general adult medical examination with abnormal findings: Secondary | ICD-10-CM | POA: Insufficient documentation

## 2022-04-14 DIAGNOSIS — Z135 Encounter for screening for eye and ear disorders: Secondary | ICD-10-CM

## 2022-04-14 DIAGNOSIS — Z1211 Encounter for screening for malignant neoplasm of colon: Secondary | ICD-10-CM

## 2022-04-14 NOTE — Assessment & Plan Note (Addendum)
Physical exam done,  labs ordered, patient is classified obese with a BMI of 35.87 explained the importance of healthy eating habits such as eating vegetables, grains, greens, avoid fatty foods and consuming a low-carb diet. Blood pressure well-controlled 114/74 patient taking amlodipine 10 mg, atenolol 25mg , clonidine 0.2mg  for blood pressure management.  Follow up in 3 months for management of HTN  Referral place to Opthalmology screening for Diabetic retinopathy  Referral to Psychiatry for long history of Schizophrenia

## 2022-04-14 NOTE — Progress Notes (Addendum)
Complete physical exam  Patient: Mitchell [name]   DOB: 01-20-1961   62 y.o. Male  MRN: [id number]  Subjective:    Chief Complaint  Patient presents with   Establish Care    Mitchell [name] is a 62 y.o. male who presents today for a complete physical exam. He reports consuming a general diet.  Patient exercise consist of push ups, karate, walking, and jumping jacks  He generally feels well. He reports sleeping well. He does not have additional problems to discuss today.    Most recent fall risk assessment:    04/14/2022   10:48 AM  Fall Risk   Falls in the past year? 0  Number falls in past yr: 0  Injury with Fall? 0  Risk for fall due to : No Fall Risks  Follow up Falls evaluation completed     Most recent depression screenings:    04/14/2022   10:48 AM 04/16/2021    6:05 PM  PHQ 2/9 Scores  PHQ - 2 Score 0   PHQ- 9 Score 0      Information is confidential and restricted. Go to Review Flowsheets to unlock data.    Vision:Within last year  Patient Care Team: Del Ria Comment, Lamar Benes, FNP as PCP - General (Family Medicine)   Outpatient Medications Prior to Visit  Medication Sig   ACCU-CHEK GUIDE test strip    amLODipine (NORVASC) 10 MG tablet Take 10 mg by mouth daily.   aspirin EC 81 MG tablet Take 1 tablet (81 mg total) by mouth daily. Swallow whole.   atenolol (TENORMIN) 25 MG tablet Take by mouth.   Blood Glucose Monitoring Suppl (ACCU-CHEK GUIDE) w/Device KIT    cloNIDine (CATAPRES) 0.2 MG tablet Take 1 tablet (0.2 mg total) by mouth 2 (two) times daily.   insulin lispro (HUMALOG) 100 UNIT/ML KwikPen Inject into the skin.   lisinopril (ZESTRIL) 20 MG tablet Take 20 mg by mouth daily.   lithium carbonate (ESKALITH) 450 MG ER tablet Take 450 mg by mouth 2 (two) times daily.   metFORMIN (GLUCOPHAGE) 1000 MG tablet Take 1 tablet (1,000 mg total) by mouth 2 (two) times daily.   senna (SENOKOT) 8.6 MG TABS tablet Take 2 tablets (17.2 mg total) by mouth daily.    VRAYLAR 1.5 MG capsule Take 1.5 mg by mouth daily.   cloZAPine (CLOZARIL) 25 MG tablet Take 1-3 tablets (25-75 mg total) by mouth See admin instructions. Taking 25 mg in the AM and 75 mg at bedtime   donepezil (ARICEPT) 5 MG tablet Take 1 tablet (5 mg total) by mouth at bedtime.   haloperidol (HALDOL) 5 MG tablet Take 1 tablet (5 mg total) by mouth 2 (two) times daily.   insulin glargine (LANTUS) 100 UNIT/ML injection Inject 0.15 mLs (15 Units total) into the skin at bedtime.   No facility-administered medications prior to visit.    Review of Systems  Constitutional:  Negative for chills and fever.  HENT:  Negative for hearing loss and tinnitus.   Eyes:  Negative for blurred vision and double vision.  Respiratory:  Negative for cough and shortness of breath.   Cardiovascular:  Negative for chest pain.  Gastrointestinal:  Negative for nausea and vomiting.  Genitourinary:  Negative for dysuria.  Musculoskeletal:  Positive for back pain. Negative for joint pain.  Skin:  Negative for itching and rash.  Neurological:  Negative for dizziness and headaches.  Psychiatric/Behavioral:  Negative for depression. The patient is not nervous/anxious.  Objective:    BP 112/70   Pulse 75   Ht 5\' 4"  (1.626 m)   Wt 209 lb (94.8 kg)   SpO2 96%   BMI 35.87 kg/m  BP Readings from Last 3 Encounters:  04/14/22 112/70  03/27/22 (!) 181/96  03/24/22 (!) 165/79      Physical Exam Vitals reviewed.  Constitutional:      General: He is not in acute distress.    Appearance: Normal appearance.  HENT:     Head: Normocephalic.     Right Ear: Tympanic membrane normal.     Left Ear: Tympanic membrane normal.     Nose: Nose normal. No congestion.     Mouth/Throat:     Mouth: Mucous membranes are moist.  Eyes:     Extraocular Movements: Extraocular movements intact.     Pupils: Pupils are equal, round, and reactive to light.  Cardiovascular:     Rate and Rhythm: Normal rate and regular  rhythm.     Pulses: Normal pulses.     Heart sounds: Normal heart sounds. No murmur heard. Pulmonary:     Effort: Pulmonary effort is normal. No respiratory distress.     Breath sounds: Normal breath sounds.  Abdominal:     General: Bowel sounds are normal.     Palpations: Abdomen is soft. There is no mass.     Tenderness: There is no abdominal tenderness. There is no guarding.     Hernia: No hernia is present.  Musculoskeletal:        General: No swelling, tenderness or deformity. Normal range of motion.     Cervical back: Normal range of motion. No rigidity.  Skin:    General: Skin is warm and dry.     Capillary Refill: Capillary refill takes less than 2 seconds.     Findings: No erythema.  Neurological:     General: No focal deficit present.     Mental Status: He is alert.     Coordination: Coordination normal.     Gait: Gait normal.  Psychiatric:        Mood and Affect: Mood normal.      No results found for any visits on 04/14/22.    Assessment & Plan:    Routine Health Maintenance and Physical Exam  Immunization History  Administered Date(s) Administered   PPD Test 02/17/2011   Tdap 01/19/2019   Zoster Recombinat (Shingrix) 04/14/2022    Health Maintenance  Topic Date Due   Medicare Annual Wellness (AWV)  Never done   OPHTHALMOLOGY EXAM  Never done   Diabetic kidney evaluation - Urine ACR  Never done   COLONOSCOPY (Pts 45-34yrs Insurance coverage will need to be confirmed)  Never done   INFLUENZA VACCINE  Never done   COVID-19 Vaccine (1) 04/30/2022 (Originally 10/18/1960)   Zoster Vaccines- Shingrix (2 of 2) 06/09/2022   HEMOGLOBIN A1C  07/08/2022   Diabetic kidney evaluation - eGFR measurement  02/04/2023   FOOT EXAM  04/08/2023   DTaP/Tdap/Td (2 - Td or Tdap) 01/18/2029   Hepatitis C Screening  Completed   HIV Screening  Completed   HPV VACCINES  Aged Out    Discussed health benefits of physical activity, and encouraged him to engage in regular  exercise appropriate for his age and condition.  Encounter for routine adult physical exam with abnormal findings Assessment & Plan: Physical exam done,  labs ordered, patient is classified obese with a BMI of 35.87 explained the importance of healthy eating habits  such as eating vegetables, grains, greens, avoid fatty foods and consuming a low-carb diet. Blood pressure well-controlled 114/74 patient taking amlodipine 10 mg, atenolol 25mg , clonidine 0.2mg  for blood pressure management.  Follow up in 3 months for management of HTN  Referral place to Opthalmology screening for Diabetic retinopathy  Referral to Psychiatry for long history of Schizophrenia      Need for hepatitis C screening test -     Hepatitis C antibody  Hyperlipidemia, unspecified hyperlipidemia type -     Lipid panel -     CBC with Differential/Platelet  Primary hypertension -     CMP14+EGFR -     Microalbumin / creatinine urine ratio  IFG (impaired fasting glucose) -     Hemoglobin A1c  Screening for colon cancer -     Cologuard  Screening for HIV (human immunodeficiency virus) -     HIV Antibody (routine testing w rflx)  Hypothyroidism, unspecified type -     TSH + free T4  Schizophrenia, unspecified type ([place]) -     Ambulatory referral to Psychiatry  Encounter for child physical exam with abnormal findings  Screening for diabetic retinopathy -     Ambulatory referral to Ophthalmology  Other orders -     Varicella-zoster vaccine IM    No follow-ups on file.     Renard Hamper Ria Comment, FNP

## 2022-04-14 NOTE — Patient Instructions (Addendum)
Please follow up in 3 months for HTN and diabetes management  Follow up with Psychiatric referral

## 2022-04-29 ENCOUNTER — Telehealth: Payer: Self-pay | Admitting: Family Medicine

## 2022-04-29 NOTE — Telephone Encounter (Signed)
error 

## 2022-04-29 NOTE — Telephone Encounter (Signed)
The patient's caretaker Bethena Roys called and stated that the patient was using Insulin Aspart and the pharmacy sent Insulin Lispro.  She wants to confirm this is the same or ok for the patient to use.  Pt uses [facility].  Phone # [phone]

## 2022-04-30 LAB — CBC WITH DIFFERENTIAL/PLATELET
Basophils Absolute: 0 10*3/uL (ref 0.0–0.2)
Basos: 0 %
EOS (ABSOLUTE): 0.2 10*3/uL (ref 0.0–0.4)
Eos: 3 %
Hematocrit: 44.5 % (ref 37.5–51.0)
Hemoglobin: 14.9 g/dL (ref 13.0–17.7)
Immature Grans (Abs): 0 10*3/uL (ref 0.0–0.1)
Immature Granulocytes: 0 %
Lymphocytes Absolute: 2.8 10*3/uL (ref 0.7–3.1)
Lymphs: 51 %
MCH: 29.8 pg (ref 26.6–33.0)
MCHC: 33.5 g/dL (ref 31.5–35.7)
MCV: 89 fL (ref 79–97)
Monocytes Absolute: 0.4 10*3/uL (ref 0.1–0.9)
Monocytes: 7 %
Neutrophils Absolute: 2.2 10*3/uL (ref 1.4–7.0)
Neutrophils: 39 %
Platelets: 265 10*3/uL (ref 150–450)
RBC: 5 x10E6/uL (ref 4.14–5.80)
RDW: 14.1 % (ref 11.6–15.4)
WBC: 5.6 10*3/uL (ref 3.4–10.8)

## 2022-04-30 LAB — CMP14+EGFR
ALT: 10 IU/L (ref 0–44)
AST: 12 IU/L (ref 0–40)
Albumin/Globulin Ratio: 1.4 (ref 1.2–2.2)
Albumin: 4.7 g/dL (ref 3.9–4.9)
Alkaline Phosphatase: 109 IU/L (ref 44–121)
BUN/Creatinine Ratio: 11 (ref 10–24)
BUN: 13 mg/dL (ref 8–27)
Bilirubin Total: 0.4 mg/dL (ref 0.0–1.2)
CO2: 22 mmol/L (ref 20–29)
Calcium: 9.6 mg/dL (ref 8.6–10.2)
Chloride: 96 mmol/L (ref 96–106)
Creatinine, Ser: 1.18 mg/dL (ref 0.76–1.27)
Globulin, Total: 3.4 g/dL (ref 1.5–4.5)
Glucose: 151 mg/dL — ABNORMAL HIGH (ref 70–99)
Potassium: 4.4 mmol/L (ref 3.5–5.2)
Sodium: 137 mmol/L (ref 134–144)
Total Protein: 8.1 g/dL (ref 6.0–8.5)
eGFR: 70 mL/min/{1.73_m2} (ref >59)

## 2022-04-30 LAB — HEMOGLOBIN A1C
Est. average glucose Bld gHb Est-mCnc: 192 mg/dL
Hgb A1c MFr Bld: 8.3 % — ABNORMAL HIGH (ref 4.8–5.6)

## 2022-04-30 LAB — LIPID PANEL
Chol/HDL Ratio: 3.4 ratio (ref 0.0–5.0)
Cholesterol, Total: 137 mg/dL (ref 100–199)
HDL: 40 mg/dL (ref >39)
LDL Chol Calc (NIH): 77 mg/dL (ref 0–99)
Triglycerides: 107 mg/dL (ref 0–149)
VLDL Cholesterol Cal: 20 mg/dL (ref 5–40)

## 2022-04-30 LAB — MICROALBUMIN / CREATININE URINE RATIO
Creatinine, Urine: 130 mg/dL
Microalb/Creat Ratio: 23 mg/g creat (ref 0–29)
Microalbumin, Urine: 30.1 ug/mL

## 2022-04-30 LAB — TSH+FREE T4
Free T4: 1.04 ng/dL (ref 0.82–1.77)
TSH: 2.42 u[IU]/mL (ref 0.450–4.500)

## 2022-04-30 LAB — HEPATITIS C ANTIBODY: Hep C Virus Ab: NONREACTIVE

## 2022-04-30 LAB — HIV ANTIBODY (ROUTINE TESTING W REFLEX): HIV Screen 4th Generation wRfx: NONREACTIVE

## 2022-05-02 LAB — COLOGUARD: COLOGUARD: NEGATIVE

## 2022-05-06 ENCOUNTER — Telehealth: Payer: Self-pay | Admitting: Family Medicine

## 2022-05-06 NOTE — Telephone Encounter (Signed)
[facility], [phone]   Called in regards to pwk that had been seen over with no response. Can you please contact her?

## 2022-05-06 NOTE — Telephone Encounter (Signed)
Spoke with Vicente Males, let her know we are working on it and will send it back as soon as we can.

## 2022-05-10 ENCOUNTER — Telehealth: Payer: Self-pay | Admitting: Family Medicine

## 2022-05-10 ENCOUNTER — Other Ambulatory Visit: Payer: Self-pay | Admitting: Family Medicine

## 2022-05-10 NOTE — Telephone Encounter (Signed)
Mitchell [name] called from [facility] and said they faxed over a froms and still waiting on these med refills on all Meds.  Patient will be completely out all meds tomorrow. Please send all refills and form was faxed over a physician order sheet from [facility]. Patient is now in his new facility [facility] # 3.   Per [facility] it is 10 medications that needs refilled.  Please contact [facility] at [phone]

## 2022-05-10 NOTE — Telephone Encounter (Signed)
Has been admitted into a facility on 02.01.2024 patient will be completely out of his meds tomorrow.

## 2022-05-10 NOTE — Telephone Encounter (Signed)
Where should I send his HTN Medications? And they would need to follow up with Isla Pence, MD for his Diabetes medications he is on insulin

## 2022-05-18 ENCOUNTER — Encounter: Payer: Self-pay | Admitting: Family Medicine

## 2022-05-18 ENCOUNTER — Telehealth (INDEPENDENT_AMBULATORY_CARE_PROVIDER_SITE_OTHER): Payer: 59 | Admitting: Family Medicine

## 2022-05-18 DIAGNOSIS — J209 Acute bronchitis, unspecified: Secondary | ICD-10-CM | POA: Diagnosis not present

## 2022-05-18 MED ORDER — AZITHROMYCIN 250 MG PO TABS: ORAL_TABLET | ORAL | 0 refills | Status: AC

## 2022-05-18 MED ORDER — BENZONATATE 100 MG PO CAPS: 100.0000 mg | ORAL_CAPSULE | Freq: Three times a day (TID) | ORAL | 0 refills | Status: DC | PRN

## 2022-05-18 NOTE — Patient Instructions (Signed)
F/U with PCP as before, call if you need to be seen sooner  You are treated for acute bronchitis, tessalon perles and Azithromycin are prescribed  Thanks for choosing [facility], we consider it a privelige to serve you.

## 2022-05-18 NOTE — Progress Notes (Signed)
Virtual Visit via Video Note  I connected with Mitchell [name] on 05/18/22 at  3:20 PM EST by a video enabled telemedicine application and verified that I am speaking with the correct person using two identifiers.  Location: Patient: home Provider: office   I discussed the limitations of evaluation and management by telemedicine and the availability of in person appointments. The patient expressed understanding and agreed to proceed.  History of Present Illness Sore throat x 2 weeks, cough x 1 week, chills last night No facial pressure or ear pain, sputum production yellow, non smoker, negative home covid test He is diabetic , denies polyuria , or polydipsia, however blood sugar in uncontrolled, therefore increased risk of complications from illness   Observations/Objective: There were no vitals taken for this visit. Good communication with no confusion and intact memory. Alert and oriented x 3 No signs of respiratory distress during speech, does have intermittent rattling cough   Assessment and Plan:  Acute bronchitis Tessalon perles and Z pack prescribed, encouraged fluids and rest, call if symptoms worsen  Type 2 diabetes mellitus ([place]) Uncontrolled will f/u with PCP as before  Follow Up Instructions:    I discussed the assessment and treatment plan with the patient. The patient was provided an opportunity to ask questions and all were answered. The patient agreed with the plan and demonstrated an understanding of the instructions.   The patient was advised to call back or seek an in-person evaluation if the symptoms worsen or if the condition fails to improve as anticipated.  I provided 12 minutes of non-face-to-face time during this encounter.   Tula Nakayama, MD

## 2022-05-23 ENCOUNTER — Encounter: Payer: Self-pay | Admitting: Family Medicine

## 2022-05-23 DIAGNOSIS — J209 Acute bronchitis, unspecified: Secondary | ICD-10-CM | POA: Insufficient documentation

## 2022-05-23 NOTE — Assessment & Plan Note (Signed)
Uncontrolled will f/u with PCP as before

## 2022-05-23 NOTE — Assessment & Plan Note (Signed)
Tessalon perles and Z pack prescribed, encouraged fluids and rest, call if symptoms worsen

## 2022-05-28 ENCOUNTER — Encounter: Payer: Self-pay | Admitting: Family Medicine

## 2022-05-28 ENCOUNTER — Ambulatory Visit (INDEPENDENT_AMBULATORY_CARE_PROVIDER_SITE_OTHER): Payer: 59 | Admitting: Family Medicine

## 2022-05-28 VITALS — Ht 64.0 in | Wt 209.0 lb

## 2022-05-28 DIAGNOSIS — Z0000 Encounter for general adult medical examination without abnormal findings: Secondary | ICD-10-CM

## 2022-05-28 DIAGNOSIS — E119 Type 2 diabetes mellitus without complications: Secondary | ICD-10-CM | POA: Diagnosis not present

## 2022-05-28 DIAGNOSIS — Z1211 Encounter for screening for malignant neoplasm of colon: Secondary | ICD-10-CM

## 2022-05-28 DIAGNOSIS — Z0001 Encounter for general adult medical examination with abnormal findings: Secondary | ICD-10-CM

## 2022-05-28 MED ORDER — FLUTICASONE PROPIONATE 50 MCG/ACT NA SUSP: 2.0000 | Freq: Every day | NASAL | 6 refills | Status: DC

## 2022-05-28 NOTE — Progress Notes (Signed)
Subjective:   Mitchell [name] is a 62 y.o. male who presents for Medicare Annual/Subsequent preventive examination.  Review of Systems     Patient denies pain, fever, chills, chest pain, palpations ,shortness of breath, blurred vision,cough, abdominal pain, nausea, vomiting, headache, dizziness. Patient is not feeling nervous or anxious   Cardiac Risk Factors include: advanced age (>15mn, >>44women)     Objective:    Today's Vitals   05/28/22 1020  Weight: 209 lb (94.8 kg)  Height: '5\' 4"'$  (1.626 m)   Body mass index is 35.87 kg/m.     05/28/2022   10:25 AM 03/24/2022    8:42 PM 03/22/2022    8:55 PM 02/03/2022    6:29 PM 01/10/2022   12:04 AM 01/06/2022    1:07 PM 04/23/2021   11:55 PM  Advanced Directives  Does Patient Have a Medical Advance Directive? No No No No No No   Would patient like information on creating a medical advance directive? No - Patient declined    No - Patient declined No - Patient declined      Information is confidential and restricted. Go to Review Flowsheets to unlock data.    Current Medications (verified) Outpatient Encounter Medications as of 05/28/2022  Medication Sig   ACCU-CHEK GUIDE test strip    Alcohol Swabs (B-D SINGLE USE SWABS REGULAR) PADS SMARTSIG:2 Topical Twice Daily   amLODipine (NORVASC) 10 MG tablet Take 10 mg by mouth daily.   aspirin EC 81 MG tablet Take 1 tablet (81 mg total) by mouth daily. Swallow whole.   atenolol (TENORMIN) 25 MG tablet Take by mouth.   benzonatate (TESSALON PERLES) 100 MG capsule Take 1 capsule (100 mg total) by mouth 3 (three) times daily as needed for cough.   Blood Glucose Monitoring Suppl (ACCU-CHEK GUIDE) w/Device KIT    cloNIDine (CATAPRES) 0.2 MG tablet Take 1 tablet (0.2 mg total) by mouth 2 (two) times daily.   DROPSAFE SAFETY PEN NEEDLES 31G X 6 MM MISC    insulin lispro (HUMALOG) 100 UNIT/ML KwikPen Inject into the skin.   lisinopril (ZESTRIL) 20 MG tablet Take 20 mg by mouth daily.   lithium  carbonate (ESKALITH) 450 MG ER tablet Take 450 mg by mouth 2 (two) times daily.   metFORMIN (GLUCOPHAGE) 1000 MG tablet Take 1 tablet (1,000 mg total) by mouth 2 (two) times daily.   senna (SENOKOT) 8.6 MG TABS tablet Take 2 tablets (17.2 mg total) by mouth daily.   VRAYLAR 1.5 MG capsule Take 1.5 mg by mouth daily.   cloZAPine (CLOZARIL) 25 MG tablet Take 1-3 tablets (25-75 mg total) by mouth See admin instructions. Taking 25 mg in the AM and 75 mg at bedtime   donepezil (ARICEPT) 5 MG tablet Take 1 tablet (5 mg total) by mouth at bedtime.   haloperidol (HALDOL) 5 MG tablet Take 1 tablet (5 mg total) by mouth 2 (two) times daily.   insulin glargine (LANTUS) 100 UNIT/ML injection Inject 0.15 mLs (15 Units total) into the skin at bedtime.   No facility-administered encounter medications on file as of 05/28/2022.    Allergies (verified) Patient has no known allergies.   History: Past Medical History:  Diagnosis Date   Bipolar affective disorder [place]    takes Zyprexa daily   Coarse tremors 10/02/2014   Diabetes mellitus    takes Victoza,Metformin,and Glipizide daily   Hypertension    takes Amlodipine,Lisinopril and Clonidine daily   Hyponatremia    history of  Lithium toxicity 10/02/2014   Mental disorder    takes Lithium daily   Schizoaffective disorder    takes Trazodone nightly   Schizoaffective disorder ([place]) 01/04/2019   Seasonal allergies    takes Claritin daily   Sleep apnea    sleep study >17yr ago   Stroke ([facility]    left arm weakness   Past Surgical History:  Procedure Laterality Date   CATARACT EXTRACTION W/PHACO Right 02/14/2013   Procedure: CATARACT EXTRACTION PHACO AND INTRAOCULAR LENS PLACEMENT ([place];  Surgeon: GAdonis Brook MD;  [facility]  Service: Ophthalmology;  Laterality: Right;   CATARACT EXTRACTION W/PHACO Left 06/13/2013   Procedure: CATARACT EXTRACTION PHACO AND INTRAOCULAR LENS PLACEMENT (I[facility]);  Surgeon: GAdonis Brook MD;  [place] [facility]  Service:  Ophthalmology;  Laterality: Left;   CIRCUMCISION  20 yrs. ago   EYE SURGERY     History reviewed. No pertinent family history. Social History   Socioeconomic History   Marital status: Divorced    Spouse name: Not on file   Number of children: Not on file   Years of education: Not on file   Highest education level: Not on file  Occupational History   Not on file  Tobacco Use   Smoking status: Never   Smokeless tobacco: Never  Vaping Use   Vaping Use: Never used  Substance and Sexual Activity   Alcohol use: Not Currently   Drug use: No   Sexual activity: Yes    Birth control/protection: None  Other Topics Concern   Not on file  Social History Narrative   Not on file   Social Determinants of Health   Financial Resource Strain: Low Risk  (05/28/2022)   Overall Financial Resource Strain (CARDIA)    Difficulty of Paying Living Expenses: Not hard at all  Food Insecurity: No Food Insecurity (05/28/2022)   Hunger Vital Sign    Worried About Running Out of Food in the Last Year: Never true    [place] the Last Year: Never true  Transportation Needs: Unmet Transportation Needs (05/28/2022)   PRAPARE - THydrologist(Medical): Yes    Lack of Transportation (Non-Medical): No  Physical Activity: Insufficiently Active (05/28/2022)   Exercise Vital Sign    Days of Exercise per Week: 2 days    Minutes of Exercise per Session: 30 min  Stress: No Stress Concern Present (05/28/2022)   [place]   Feeling of Stress : Not at all  Social Connections: Unknown (05/28/2022)   Social Connection and Isolation Panel [NHANES]    Frequency of Communication with Friends and Family: Twice a week    Frequency of Social Gatherings with Friends and Family: Once a week    Attends Religious Services: More than 4 times per year    Active Member of [employer] Organizations: Yes    Attends [employer] Meetings: 1 to 4 times per year    Marital Status: Not on file    Tobacco Counseling Counseling given: Not Answered   Clinical Intake:  Pre-visit preparation completed: No  Pain : No/denies pain     BMI - recorded: 35.87 Nutritional Status: BMI > 30  Obese Diabetes: Yes CBG done?: No Did pt. bring in CBG monitor from home?: No  How often do you need to have someone help you when you read instructions, pamphlets, or other written materials from your doctor or pharmacy?: 1 -  Never  Diabetic?Yes  Interpreter Needed?: No      Activities of Daily Living    05/28/2022   10:26 AM 02/08/2022    6:04 PM  In your present state of health, do you have any difficulty performing the following activities:  Hearing? 0   Vision? 0   Difficulty concentrating or making decisions? 0   Walking or climbing stairs? 0   Dressing or bathing? 0   Doing errands, shopping? 1   Preparing Food and eating ? N   Using the Toilet? N   In the past six months, have you accidently leaked urine? N   Do you have problems with loss of bowel control? N   Managing your Medications? Y   Managing your Finances? Y   Housekeeping or managing your Housekeeping? Y      Information is confidential and restricted. Go to Review Flowsheets to unlock data.    Patient Care Team: Del Ria Comment, Lamar Benes, FNP as PCP - General (Family Medicine)  Indicate any recent Medical Services you may have received from other than [facility] providers in the past year (date may be approximate).     Assessment:   This is a routine wellness examination for Mitchell [name].  Hearing/Vision screen No results found.  Dietary issues and exercise activities discussed:  Advise lifestyle modifications follow diet low in saturated fat, reduce dietary salt intake, avoid fatty foods, maintain an exercise routine 3 to 5 days a week for a minimum total of 150 minutes.    Goals Addressed   None    Depression Screen    05/18/2022    3:09 PM  04/14/2022   10:48 AM 04/16/2021    6:05 PM 12/30/2015   10:11 AM  PHQ 2/9 Scores  PHQ - 2 Score 0 0  0  PHQ- 9 Score  0       Information is confidential and restricted. Go to Review Flowsheets to unlock data.    Fall Risk    05/28/2022   10:26 AM 05/18/2022    3:09 PM 04/14/2022   10:48 AM 12/30/2015   10:11 AM  Fall Risk   Falls in the past year? 0 0 0 No  Number falls in past yr: 0 0 0   Injury with Fall? 0 0 0   Risk for fall due to : No Fall Risks No Fall Risks No Fall Risks   Follow up Falls evaluation completed Falls evaluation completed Falls evaluation completed     [place]:  Any stairs in or around the home? No  If so, are there any without handrails? No  Home free of loose throw rugs in walkways, pet beds, electrical cords, etc? Yes  Adequate lighting in your home to reduce risk of falls? Yes   ASSISTIVE DEVICES UTILIZED TO PREVENT FALLS:  Life alert? No  Use of a cane, walker or w/c? No  Grab bars in the bathroom? Yes  Shower chair or bench in shower? Yes  Elevated toilet seat or a handicapped toilet? No    Cognitive Function:        05/28/2022   10:26 AM  6CIT Screen  What Year? 0 points  What month? 0 points  What time? 0 points  Count back from 20 4 points  Months in reverse 4 points  Repeat phrase 4 points  Total Score 12 points    Immunizations Immunization History  Administered Date(s) Administered   PPD Test 02/17/2011  Tdap 01/19/2019   Zoster Recombinat (Shingrix) 04/14/2022    TDAP status: Up to date  Flu Vaccine status: Due, Education has been provided regarding the importance of this vaccine. Advised may receive this vaccine at local pharmacy or Health Dept. Aware to provide a copy of the vaccination record if obtained from local pharmacy or Health Dept. Verbalized acceptance and understanding.  Pneumococcal vaccine status: Up to date  Covid-19 vaccine status: Information provided on how to  obtain vaccines.   Qualifies for Shingles Vaccine? Yes   Zostavax completed No   Shingrix Completed?: No.    Education has been provided regarding the importance of this vaccine. Patient has been advised to call insurance company to determine out of pocket expense if they have not yet received this vaccine. Advised may also receive vaccine at local pharmacy or Health Dept. Verbalized acceptance and understanding.  Screening Tests Health Maintenance  Topic Date Due   Medicare Annual Wellness (AWV)  Never done   OPHTHALMOLOGY EXAM  Never done   COLONOSCOPY (Pts 45-68yr Insurance coverage will need to be confirmed)  Never done   COVID-19 Vaccine (1) 06/13/2022 (Originally 10/18/1960)   INFLUENZA VACCINE  06/27/2022 (Originally 10/27/2021)   Zoster Vaccines- Shingrix (2 of 2) 06/09/2022   HEMOGLOBIN A1C  10/26/2022   FOOT EXAM  04/08/2023   Diabetic kidney evaluation - eGFR measurement  04/28/2023   Diabetic kidney evaluation - Urine ACR  04/28/2023   DTaP/Tdap/Td (2 - Td or Tdap) 01/18/2029   Hepatitis C Screening  Completed   HIV Screening  Completed   HPV VACCINES  Aged Out    Health Maintenance  Health Maintenance Due  Topic Date Due   Medicare Annual Wellness (AWV)  Never done   OPHTHALMOLOGY EXAM  Never done   COLONOSCOPY (Pts 45-430yrInsurance coverage will need to be confirmed)  Never done    Colorectal cancer screening: Referral to GI placed 05/2022. Pt aware the office will call re: appt.  Lung Cancer Screening: (Low Dose CT Chest recommended if Age 765-80ears, 30 pack-year currently smoking OR have quit w/in 15years.) does not qualify.   Lung Cancer Screening Referral: N/A  Additional Screening:  Hepatitis C Screening: does not qualify; Completed 2024  Vision Screening: Recommended annual ophthalmology exams for early detection of glaucoma and other disorders of the eye. Is the patient up to date with their annual eye exam?  No  Who is the provider or what is the  name of the office in which the patient attends annual eye exams? N/A If pt is not established with a provider, would they like to be referred to a provider to establish care? Yes .   Dental Screening: Recommended annual dental exams for proper oral hygiene  Community Resource Referral / Chronic Care Management: CRR required this visit?  No   CCM required this visit?  No      Plan:     I have personally reviewed and noted the following in the patient's chart:   Medical and social history Use of alcohol, tobacco or illicit drugs  Current medications and supplements including opioid prescriptions. Patient is not currently taking opioid prescriptions. Functional ability and status Nutritional status Physical activity Advanced directives List of other physicians Hospitalizations, surgeries, and ER visits in previous 12 months Vitals Screenings to include cognitive, depression, and falls Referrals and appointments  In addition, I have reviewed and discussed with patient certain preventive protocols, quality metrics, and best practice recommendations. A written personalized care plan  for preventive services as well as general preventive health recommendations were provided to patient.     [place], [place]   05/28/2022

## 2022-06-01 ENCOUNTER — Encounter: Payer: Self-pay | Admitting: *Deleted

## 2022-06-07 ENCOUNTER — Other Ambulatory Visit: Payer: Self-pay | Admitting: Family Medicine

## 2022-06-07 MED ORDER — ACCU-CHEK GUIDE VI STRP: ORAL_STRIP | 3 refills | Status: DC

## 2022-06-07 NOTE — Telephone Encounter (Signed)
Sent!

## 2022-06-10 MED ORDER — LISINOPRIL 20 MG PO TABS: 20.0000 mg | ORAL_TABLET | Freq: Every day | ORAL | 3 refills | Status: DC

## 2022-06-10 MED ORDER — AMLODIPINE BESYLATE 10 MG PO TABS: 10.0000 mg | ORAL_TABLET | Freq: Every day | ORAL | 2 refills | Status: DC

## 2022-06-10 MED ORDER — ATENOLOL 25 MG PO TABS: 25.0000 mg | ORAL_TABLET | Freq: Every day | ORAL | 3 refills | Status: DC

## 2022-06-10 MED ORDER — ASPIRIN 81 MG PO TBEC: 81.0000 mg | DELAYED_RELEASE_TABLET | Freq: Every day | ORAL | 2 refills | Status: AC

## 2022-06-14 ENCOUNTER — Other Ambulatory Visit: Payer: Self-pay | Admitting: Family Medicine

## 2022-06-24 ENCOUNTER — Other Ambulatory Visit: Payer: Self-pay | Admitting: Family Medicine

## 2022-06-25 ENCOUNTER — Other Ambulatory Visit: Payer: Self-pay | Admitting: Family Medicine

## 2022-06-25 MED ORDER — INSULIN GLARGINE 100 UNIT/ML ~~LOC~~ SOLN: 15.0000 [IU] | Freq: Every day | SUBCUTANEOUS | 1 refills | Status: DC

## 2022-06-28 ENCOUNTER — Other Ambulatory Visit: Payer: Self-pay

## 2022-06-28 ENCOUNTER — Telehealth: Payer: Self-pay | Admitting: Family Medicine

## 2022-06-28 ENCOUNTER — Other Ambulatory Visit: Payer: Self-pay | Admitting: Family Medicine

## 2022-06-28 MED ORDER — INSULIN GLARGINE 100 UNIT/ML ~~LOC~~ SOLN: 15.0000 [IU] | Freq: Two times a day (BID) | SUBCUTANEOUS | 1 refills | Status: DC

## 2022-06-28 NOTE — Telephone Encounter (Signed)
Resent

## 2022-06-28 NOTE — Telephone Encounter (Signed)
I meant using lantus 15 units twice daily

## 2022-06-28 NOTE — Telephone Encounter (Signed)
[place], [place], [phone]  Called needing clarification on insulin glargine (LANTUS) 100 UNIT/ML injection . Can you please look into this?

## 2022-07-05 DIAGNOSIS — I10 Essential (primary) hypertension: Secondary | ICD-10-CM | POA: Diagnosis not present

## 2022-07-05 DIAGNOSIS — R58 Hemorrhage, not elsewhere classified: Secondary | ICD-10-CM | POA: Diagnosis not present

## 2022-07-05 DIAGNOSIS — S0990XA Unspecified injury of head, initial encounter: Secondary | ICD-10-CM | POA: Diagnosis not present

## 2022-07-05 DIAGNOSIS — E669 Obesity, unspecified: Secondary | ICD-10-CM | POA: Diagnosis not present

## 2022-07-05 DIAGNOSIS — S0003XA Contusion of scalp, initial encounter: Secondary | ICD-10-CM | POA: Diagnosis not present

## 2022-07-05 DIAGNOSIS — B029 Zoster without complications: Secondary | ICD-10-CM | POA: Diagnosis not present

## 2022-07-05 DIAGNOSIS — F0390 Unspecified dementia without behavioral disturbance: Secondary | ICD-10-CM | POA: Diagnosis not present

## 2022-07-05 DIAGNOSIS — R45851 Suicidal ideations: Secondary | ICD-10-CM | POA: Diagnosis not present

## 2022-07-05 DIAGNOSIS — F209 Schizophrenia, unspecified: Secondary | ICD-10-CM | POA: Diagnosis not present

## 2022-07-05 DIAGNOSIS — S0181XA Laceration without foreign body of other part of head, initial encounter: Secondary | ICD-10-CM | POA: Diagnosis not present

## 2022-07-05 DIAGNOSIS — Z743 Need for continuous supervision: Secondary | ICD-10-CM | POA: Diagnosis not present

## 2022-07-05 DIAGNOSIS — E119 Type 2 diabetes mellitus without complications: Secondary | ICD-10-CM | POA: Diagnosis not present

## 2022-07-05 DIAGNOSIS — Z7982 Long term (current) use of aspirin: Secondary | ICD-10-CM | POA: Diagnosis not present

## 2022-07-14 ENCOUNTER — Ambulatory Visit: Payer: Medicare Other | Admitting: Podiatry

## 2022-07-14 DIAGNOSIS — R45851 Suicidal ideations: Secondary | ICD-10-CM | POA: Diagnosis not present

## 2022-07-14 DIAGNOSIS — B029 Zoster without complications: Secondary | ICD-10-CM | POA: Diagnosis not present

## 2022-07-14 DIAGNOSIS — E119 Type 2 diabetes mellitus without complications: Secondary | ICD-10-CM | POA: Diagnosis not present

## 2022-07-14 DIAGNOSIS — Z7982 Long term (current) use of aspirin: Secondary | ICD-10-CM | POA: Diagnosis not present

## 2022-07-14 DIAGNOSIS — F209 Schizophrenia, unspecified: Secondary | ICD-10-CM | POA: Diagnosis not present

## 2022-07-14 DIAGNOSIS — F0390 Unspecified dementia without behavioral disturbance: Secondary | ICD-10-CM | POA: Diagnosis not present

## 2022-07-14 DIAGNOSIS — E669 Obesity, unspecified: Secondary | ICD-10-CM | POA: Diagnosis not present

## 2022-07-14 DIAGNOSIS — S0181XA Laceration without foreign body of other part of head, initial encounter: Secondary | ICD-10-CM | POA: Diagnosis not present

## 2022-07-14 DIAGNOSIS — I10 Essential (primary) hypertension: Secondary | ICD-10-CM | POA: Diagnosis not present

## 2022-07-15 ENCOUNTER — Ambulatory Visit: Payer: 59 | Admitting: Family Medicine

## 2022-07-15 DIAGNOSIS — B029 Zoster without complications: Secondary | ICD-10-CM | POA: Diagnosis not present

## 2022-07-15 DIAGNOSIS — I10 Essential (primary) hypertension: Secondary | ICD-10-CM | POA: Diagnosis not present

## 2022-07-15 DIAGNOSIS — E119 Type 2 diabetes mellitus without complications: Secondary | ICD-10-CM | POA: Diagnosis not present

## 2022-07-15 DIAGNOSIS — R45851 Suicidal ideations: Secondary | ICD-10-CM | POA: Diagnosis not present

## 2022-07-15 DIAGNOSIS — E669 Obesity, unspecified: Secondary | ICD-10-CM | POA: Diagnosis not present

## 2022-07-15 DIAGNOSIS — Z7982 Long term (current) use of aspirin: Secondary | ICD-10-CM | POA: Diagnosis not present

## 2022-07-15 DIAGNOSIS — F0390 Unspecified dementia without behavioral disturbance: Secondary | ICD-10-CM | POA: Diagnosis not present

## 2022-07-15 DIAGNOSIS — S0181XA Laceration without foreign body of other part of head, initial encounter: Secondary | ICD-10-CM | POA: Diagnosis not present

## 2022-07-15 DIAGNOSIS — F209 Schizophrenia, unspecified: Secondary | ICD-10-CM | POA: Diagnosis not present

## 2022-07-16 DIAGNOSIS — B029 Zoster without complications: Secondary | ICD-10-CM | POA: Diagnosis not present

## 2022-07-16 DIAGNOSIS — Z7982 Long term (current) use of aspirin: Secondary | ICD-10-CM | POA: Diagnosis not present

## 2022-07-16 DIAGNOSIS — F209 Schizophrenia, unspecified: Secondary | ICD-10-CM | POA: Diagnosis not present

## 2022-07-16 DIAGNOSIS — S0181XA Laceration without foreign body of other part of head, initial encounter: Secondary | ICD-10-CM | POA: Diagnosis not present

## 2022-07-16 DIAGNOSIS — R45851 Suicidal ideations: Secondary | ICD-10-CM | POA: Diagnosis not present

## 2022-07-16 DIAGNOSIS — I10 Essential (primary) hypertension: Secondary | ICD-10-CM | POA: Diagnosis not present

## 2022-07-16 DIAGNOSIS — F0390 Unspecified dementia without behavioral disturbance: Secondary | ICD-10-CM | POA: Diagnosis not present

## 2022-07-16 DIAGNOSIS — E669 Obesity, unspecified: Secondary | ICD-10-CM | POA: Diagnosis not present

## 2022-07-16 DIAGNOSIS — E119 Type 2 diabetes mellitus without complications: Secondary | ICD-10-CM | POA: Diagnosis not present

## 2022-07-17 DIAGNOSIS — B029 Zoster without complications: Secondary | ICD-10-CM | POA: Diagnosis not present

## 2022-07-17 DIAGNOSIS — R45851 Suicidal ideations: Secondary | ICD-10-CM | POA: Diagnosis not present

## 2022-07-17 DIAGNOSIS — E119 Type 2 diabetes mellitus without complications: Secondary | ICD-10-CM | POA: Diagnosis not present

## 2022-07-17 DIAGNOSIS — F209 Schizophrenia, unspecified: Secondary | ICD-10-CM | POA: Diagnosis not present

## 2022-07-17 DIAGNOSIS — I10 Essential (primary) hypertension: Secondary | ICD-10-CM | POA: Diagnosis not present

## 2022-07-17 DIAGNOSIS — E669 Obesity, unspecified: Secondary | ICD-10-CM | POA: Diagnosis not present

## 2022-07-17 DIAGNOSIS — Z7982 Long term (current) use of aspirin: Secondary | ICD-10-CM | POA: Diagnosis not present

## 2022-07-17 DIAGNOSIS — F0390 Unspecified dementia without behavioral disturbance: Secondary | ICD-10-CM | POA: Diagnosis not present

## 2022-07-17 DIAGNOSIS — S0181XA Laceration without foreign body of other part of head, initial encounter: Secondary | ICD-10-CM | POA: Diagnosis not present

## 2022-07-18 DIAGNOSIS — Z7982 Long term (current) use of aspirin: Secondary | ICD-10-CM | POA: Diagnosis not present

## 2022-07-18 DIAGNOSIS — E119 Type 2 diabetes mellitus without complications: Secondary | ICD-10-CM | POA: Diagnosis not present

## 2022-07-18 DIAGNOSIS — B029 Zoster without complications: Secondary | ICD-10-CM | POA: Diagnosis not present

## 2022-07-18 DIAGNOSIS — F0390 Unspecified dementia without behavioral disturbance: Secondary | ICD-10-CM | POA: Diagnosis not present

## 2022-07-18 DIAGNOSIS — S0181XA Laceration without foreign body of other part of head, initial encounter: Secondary | ICD-10-CM | POA: Diagnosis not present

## 2022-07-18 DIAGNOSIS — E669 Obesity, unspecified: Secondary | ICD-10-CM | POA: Diagnosis not present

## 2022-07-18 DIAGNOSIS — R45851 Suicidal ideations: Secondary | ICD-10-CM | POA: Diagnosis not present

## 2022-07-18 DIAGNOSIS — F209 Schizophrenia, unspecified: Secondary | ICD-10-CM | POA: Diagnosis not present

## 2022-07-18 DIAGNOSIS — I10 Essential (primary) hypertension: Secondary | ICD-10-CM | POA: Diagnosis not present

## 2022-07-19 DIAGNOSIS — Z7982 Long term (current) use of aspirin: Secondary | ICD-10-CM | POA: Diagnosis not present

## 2022-07-19 DIAGNOSIS — I10 Essential (primary) hypertension: Secondary | ICD-10-CM | POA: Diagnosis not present

## 2022-07-19 DIAGNOSIS — B029 Zoster without complications: Secondary | ICD-10-CM | POA: Diagnosis not present

## 2022-07-19 DIAGNOSIS — R45851 Suicidal ideations: Secondary | ICD-10-CM | POA: Diagnosis not present

## 2022-07-19 DIAGNOSIS — E669 Obesity, unspecified: Secondary | ICD-10-CM | POA: Diagnosis not present

## 2022-07-19 DIAGNOSIS — F209 Schizophrenia, unspecified: Secondary | ICD-10-CM | POA: Diagnosis not present

## 2022-07-19 DIAGNOSIS — E119 Type 2 diabetes mellitus without complications: Secondary | ICD-10-CM | POA: Diagnosis not present

## 2022-07-19 DIAGNOSIS — F0390 Unspecified dementia without behavioral disturbance: Secondary | ICD-10-CM | POA: Diagnosis not present

## 2022-07-19 DIAGNOSIS — S0181XA Laceration without foreign body of other part of head, initial encounter: Secondary | ICD-10-CM | POA: Diagnosis not present

## 2022-07-20 DIAGNOSIS — S0181XA Laceration without foreign body of other part of head, initial encounter: Secondary | ICD-10-CM | POA: Diagnosis not present

## 2022-07-20 DIAGNOSIS — B029 Zoster without complications: Secondary | ICD-10-CM | POA: Diagnosis not present

## 2022-07-20 DIAGNOSIS — I10 Essential (primary) hypertension: Secondary | ICD-10-CM | POA: Diagnosis not present

## 2022-07-20 DIAGNOSIS — E669 Obesity, unspecified: Secondary | ICD-10-CM | POA: Diagnosis not present

## 2022-07-20 DIAGNOSIS — F0390 Unspecified dementia without behavioral disturbance: Secondary | ICD-10-CM | POA: Diagnosis not present

## 2022-07-20 DIAGNOSIS — F209 Schizophrenia, unspecified: Secondary | ICD-10-CM | POA: Diagnosis not present

## 2022-07-20 DIAGNOSIS — E119 Type 2 diabetes mellitus without complications: Secondary | ICD-10-CM | POA: Diagnosis not present

## 2022-07-20 DIAGNOSIS — Z7982 Long term (current) use of aspirin: Secondary | ICD-10-CM | POA: Diagnosis not present

## 2022-07-20 DIAGNOSIS — R45851 Suicidal ideations: Secondary | ICD-10-CM | POA: Diagnosis not present

## 2022-07-21 DIAGNOSIS — S0181XA Laceration without foreign body of other part of head, initial encounter: Secondary | ICD-10-CM | POA: Diagnosis not present

## 2022-07-21 DIAGNOSIS — I10 Essential (primary) hypertension: Secondary | ICD-10-CM | POA: Diagnosis not present

## 2022-07-21 DIAGNOSIS — E669 Obesity, unspecified: Secondary | ICD-10-CM | POA: Diagnosis not present

## 2022-07-21 DIAGNOSIS — F209 Schizophrenia, unspecified: Secondary | ICD-10-CM | POA: Diagnosis not present

## 2022-07-21 DIAGNOSIS — E119 Type 2 diabetes mellitus without complications: Secondary | ICD-10-CM | POA: Diagnosis not present

## 2022-07-21 DIAGNOSIS — R45851 Suicidal ideations: Secondary | ICD-10-CM | POA: Diagnosis not present

## 2022-07-21 DIAGNOSIS — B029 Zoster without complications: Secondary | ICD-10-CM | POA: Diagnosis not present

## 2022-07-21 DIAGNOSIS — Z7982 Long term (current) use of aspirin: Secondary | ICD-10-CM | POA: Diagnosis not present

## 2022-07-21 DIAGNOSIS — F0390 Unspecified dementia without behavioral disturbance: Secondary | ICD-10-CM | POA: Diagnosis not present

## 2022-07-22 DIAGNOSIS — B029 Zoster without complications: Secondary | ICD-10-CM | POA: Diagnosis not present

## 2022-07-22 DIAGNOSIS — S0181XA Laceration without foreign body of other part of head, initial encounter: Secondary | ICD-10-CM | POA: Diagnosis not present

## 2022-07-22 DIAGNOSIS — I10 Essential (primary) hypertension: Secondary | ICD-10-CM | POA: Diagnosis not present

## 2022-07-22 DIAGNOSIS — E669 Obesity, unspecified: Secondary | ICD-10-CM | POA: Diagnosis not present

## 2022-07-22 DIAGNOSIS — Z7982 Long term (current) use of aspirin: Secondary | ICD-10-CM | POA: Diagnosis not present

## 2022-07-22 DIAGNOSIS — F0390 Unspecified dementia without behavioral disturbance: Secondary | ICD-10-CM | POA: Diagnosis not present

## 2022-07-22 DIAGNOSIS — E119 Type 2 diabetes mellitus without complications: Secondary | ICD-10-CM | POA: Diagnosis not present

## 2022-07-22 DIAGNOSIS — F209 Schizophrenia, unspecified: Secondary | ICD-10-CM | POA: Diagnosis not present

## 2022-07-22 DIAGNOSIS — R45851 Suicidal ideations: Secondary | ICD-10-CM | POA: Diagnosis not present

## 2022-07-23 DIAGNOSIS — I10 Essential (primary) hypertension: Secondary | ICD-10-CM | POA: Diagnosis not present

## 2022-07-23 DIAGNOSIS — F0390 Unspecified dementia without behavioral disturbance: Secondary | ICD-10-CM | POA: Diagnosis not present

## 2022-07-23 DIAGNOSIS — Z7982 Long term (current) use of aspirin: Secondary | ICD-10-CM | POA: Diagnosis not present

## 2022-07-23 DIAGNOSIS — E119 Type 2 diabetes mellitus without complications: Secondary | ICD-10-CM | POA: Diagnosis not present

## 2022-07-23 DIAGNOSIS — F209 Schizophrenia, unspecified: Secondary | ICD-10-CM | POA: Diagnosis not present

## 2022-07-23 DIAGNOSIS — B029 Zoster without complications: Secondary | ICD-10-CM | POA: Diagnosis not present

## 2022-07-23 DIAGNOSIS — E669 Obesity, unspecified: Secondary | ICD-10-CM | POA: Diagnosis not present

## 2022-07-23 DIAGNOSIS — S0181XA Laceration without foreign body of other part of head, initial encounter: Secondary | ICD-10-CM | POA: Diagnosis not present

## 2022-07-23 DIAGNOSIS — R45851 Suicidal ideations: Secondary | ICD-10-CM | POA: Diagnosis not present

## 2022-07-24 DIAGNOSIS — R45851 Suicidal ideations: Secondary | ICD-10-CM | POA: Diagnosis not present

## 2022-07-24 DIAGNOSIS — I10 Essential (primary) hypertension: Secondary | ICD-10-CM | POA: Diagnosis not present

## 2022-07-24 DIAGNOSIS — B029 Zoster without complications: Secondary | ICD-10-CM | POA: Diagnosis not present

## 2022-07-24 DIAGNOSIS — F0390 Unspecified dementia without behavioral disturbance: Secondary | ICD-10-CM | POA: Diagnosis not present

## 2022-07-24 DIAGNOSIS — E669 Obesity, unspecified: Secondary | ICD-10-CM | POA: Diagnosis not present

## 2022-07-24 DIAGNOSIS — S0181XA Laceration without foreign body of other part of head, initial encounter: Secondary | ICD-10-CM | POA: Diagnosis not present

## 2022-07-24 DIAGNOSIS — E119 Type 2 diabetes mellitus without complications: Secondary | ICD-10-CM | POA: Diagnosis not present

## 2022-07-24 DIAGNOSIS — F209 Schizophrenia, unspecified: Secondary | ICD-10-CM | POA: Diagnosis not present

## 2022-07-24 DIAGNOSIS — Z7982 Long term (current) use of aspirin: Secondary | ICD-10-CM | POA: Diagnosis not present

## 2022-07-25 DIAGNOSIS — R45851 Suicidal ideations: Secondary | ICD-10-CM | POA: Diagnosis not present

## 2022-07-25 DIAGNOSIS — E119 Type 2 diabetes mellitus without complications: Secondary | ICD-10-CM | POA: Diagnosis not present

## 2022-07-25 DIAGNOSIS — E669 Obesity, unspecified: Secondary | ICD-10-CM | POA: Diagnosis not present

## 2022-07-25 DIAGNOSIS — F0390 Unspecified dementia without behavioral disturbance: Secondary | ICD-10-CM | POA: Diagnosis not present

## 2022-07-25 DIAGNOSIS — S0181XA Laceration without foreign body of other part of head, initial encounter: Secondary | ICD-10-CM | POA: Diagnosis not present

## 2022-07-25 DIAGNOSIS — B029 Zoster without complications: Secondary | ICD-10-CM | POA: Diagnosis not present

## 2022-07-25 DIAGNOSIS — I10 Essential (primary) hypertension: Secondary | ICD-10-CM | POA: Diagnosis not present

## 2022-07-25 DIAGNOSIS — F209 Schizophrenia, unspecified: Secondary | ICD-10-CM | POA: Diagnosis not present

## 2022-07-25 DIAGNOSIS — Z7982 Long term (current) use of aspirin: Secondary | ICD-10-CM | POA: Diagnosis not present

## 2022-07-26 DIAGNOSIS — B029 Zoster without complications: Secondary | ICD-10-CM | POA: Diagnosis not present

## 2022-07-26 DIAGNOSIS — E119 Type 2 diabetes mellitus without complications: Secondary | ICD-10-CM | POA: Diagnosis not present

## 2022-07-26 DIAGNOSIS — F209 Schizophrenia, unspecified: Secondary | ICD-10-CM | POA: Diagnosis not present

## 2022-07-26 DIAGNOSIS — F0390 Unspecified dementia without behavioral disturbance: Secondary | ICD-10-CM | POA: Diagnosis not present

## 2022-07-26 DIAGNOSIS — R45851 Suicidal ideations: Secondary | ICD-10-CM | POA: Diagnosis not present

## 2022-07-26 DIAGNOSIS — Z7982 Long term (current) use of aspirin: Secondary | ICD-10-CM | POA: Diagnosis not present

## 2022-07-26 DIAGNOSIS — I10 Essential (primary) hypertension: Secondary | ICD-10-CM | POA: Diagnosis not present

## 2022-07-26 DIAGNOSIS — S0181XA Laceration without foreign body of other part of head, initial encounter: Secondary | ICD-10-CM | POA: Diagnosis not present

## 2022-07-26 DIAGNOSIS — E669 Obesity, unspecified: Secondary | ICD-10-CM | POA: Diagnosis not present

## 2022-07-27 DIAGNOSIS — F209 Schizophrenia, unspecified: Secondary | ICD-10-CM | POA: Diagnosis not present

## 2022-07-27 DIAGNOSIS — B029 Zoster without complications: Secondary | ICD-10-CM | POA: Diagnosis not present

## 2022-07-27 DIAGNOSIS — Z7982 Long term (current) use of aspirin: Secondary | ICD-10-CM | POA: Diagnosis not present

## 2022-07-27 DIAGNOSIS — F0390 Unspecified dementia without behavioral disturbance: Secondary | ICD-10-CM | POA: Diagnosis not present

## 2022-07-27 DIAGNOSIS — R45851 Suicidal ideations: Secondary | ICD-10-CM | POA: Diagnosis not present

## 2022-07-27 DIAGNOSIS — E669 Obesity, unspecified: Secondary | ICD-10-CM | POA: Diagnosis not present

## 2022-07-27 DIAGNOSIS — I10 Essential (primary) hypertension: Secondary | ICD-10-CM | POA: Diagnosis not present

## 2022-07-27 DIAGNOSIS — S0181XA Laceration without foreign body of other part of head, initial encounter: Secondary | ICD-10-CM | POA: Diagnosis not present

## 2022-07-27 DIAGNOSIS — E119 Type 2 diabetes mellitus without complications: Secondary | ICD-10-CM | POA: Diagnosis not present

## 2022-07-28 DIAGNOSIS — Z7982 Long term (current) use of aspirin: Secondary | ICD-10-CM | POA: Diagnosis not present

## 2022-07-28 DIAGNOSIS — S0181XA Laceration without foreign body of other part of head, initial encounter: Secondary | ICD-10-CM | POA: Diagnosis not present

## 2022-07-28 DIAGNOSIS — F209 Schizophrenia, unspecified: Secondary | ICD-10-CM | POA: Diagnosis not present

## 2022-07-28 DIAGNOSIS — E669 Obesity, unspecified: Secondary | ICD-10-CM | POA: Diagnosis not present

## 2022-07-28 DIAGNOSIS — R45851 Suicidal ideations: Secondary | ICD-10-CM | POA: Diagnosis not present

## 2022-07-28 DIAGNOSIS — E119 Type 2 diabetes mellitus without complications: Secondary | ICD-10-CM | POA: Diagnosis not present

## 2022-07-28 DIAGNOSIS — F0390 Unspecified dementia without behavioral disturbance: Secondary | ICD-10-CM | POA: Diagnosis not present

## 2022-07-28 DIAGNOSIS — I10 Essential (primary) hypertension: Secondary | ICD-10-CM | POA: Diagnosis not present

## 2022-07-28 DIAGNOSIS — B029 Zoster without complications: Secondary | ICD-10-CM | POA: Diagnosis not present

## 2022-07-29 DIAGNOSIS — E669 Obesity, unspecified: Secondary | ICD-10-CM | POA: Diagnosis not present

## 2022-07-29 DIAGNOSIS — Z7982 Long term (current) use of aspirin: Secondary | ICD-10-CM | POA: Diagnosis not present

## 2022-07-29 DIAGNOSIS — S0181XA Laceration without foreign body of other part of head, initial encounter: Secondary | ICD-10-CM | POA: Diagnosis not present

## 2022-07-29 DIAGNOSIS — R45851 Suicidal ideations: Secondary | ICD-10-CM | POA: Diagnosis not present

## 2022-07-29 DIAGNOSIS — F209 Schizophrenia, unspecified: Secondary | ICD-10-CM | POA: Diagnosis not present

## 2022-07-29 DIAGNOSIS — B029 Zoster without complications: Secondary | ICD-10-CM | POA: Diagnosis not present

## 2022-07-29 DIAGNOSIS — F0390 Unspecified dementia without behavioral disturbance: Secondary | ICD-10-CM | POA: Diagnosis not present

## 2022-07-29 DIAGNOSIS — E119 Type 2 diabetes mellitus without complications: Secondary | ICD-10-CM | POA: Diagnosis not present

## 2022-07-29 DIAGNOSIS — I10 Essential (primary) hypertension: Secondary | ICD-10-CM | POA: Diagnosis not present

## 2022-07-30 DIAGNOSIS — S0181XA Laceration without foreign body of other part of head, initial encounter: Secondary | ICD-10-CM | POA: Diagnosis not present

## 2022-07-30 DIAGNOSIS — Z7982 Long term (current) use of aspirin: Secondary | ICD-10-CM | POA: Diagnosis not present

## 2022-07-30 DIAGNOSIS — E119 Type 2 diabetes mellitus without complications: Secondary | ICD-10-CM | POA: Diagnosis not present

## 2022-07-30 DIAGNOSIS — F209 Schizophrenia, unspecified: Secondary | ICD-10-CM | POA: Diagnosis not present

## 2022-07-30 DIAGNOSIS — I10 Essential (primary) hypertension: Secondary | ICD-10-CM | POA: Diagnosis not present

## 2022-07-30 DIAGNOSIS — R45851 Suicidal ideations: Secondary | ICD-10-CM | POA: Diagnosis not present

## 2022-07-30 DIAGNOSIS — E669 Obesity, unspecified: Secondary | ICD-10-CM | POA: Diagnosis not present

## 2022-07-30 DIAGNOSIS — B029 Zoster without complications: Secondary | ICD-10-CM | POA: Diagnosis not present

## 2022-07-30 DIAGNOSIS — F0390 Unspecified dementia without behavioral disturbance: Secondary | ICD-10-CM | POA: Diagnosis not present

## 2022-07-31 DIAGNOSIS — F0390 Unspecified dementia without behavioral disturbance: Secondary | ICD-10-CM | POA: Diagnosis not present

## 2022-07-31 DIAGNOSIS — I10 Essential (primary) hypertension: Secondary | ICD-10-CM | POA: Diagnosis not present

## 2022-07-31 DIAGNOSIS — E119 Type 2 diabetes mellitus without complications: Secondary | ICD-10-CM | POA: Diagnosis not present

## 2022-07-31 DIAGNOSIS — R45851 Suicidal ideations: Secondary | ICD-10-CM | POA: Diagnosis not present

## 2022-07-31 DIAGNOSIS — S0181XA Laceration without foreign body of other part of head, initial encounter: Secondary | ICD-10-CM | POA: Diagnosis not present

## 2022-07-31 DIAGNOSIS — Z7982 Long term (current) use of aspirin: Secondary | ICD-10-CM | POA: Diagnosis not present

## 2022-07-31 DIAGNOSIS — F209 Schizophrenia, unspecified: Secondary | ICD-10-CM | POA: Diagnosis not present

## 2022-07-31 DIAGNOSIS — B029 Zoster without complications: Secondary | ICD-10-CM | POA: Diagnosis not present

## 2022-07-31 DIAGNOSIS — E669 Obesity, unspecified: Secondary | ICD-10-CM | POA: Diagnosis not present

## 2022-08-01 DIAGNOSIS — F0390 Unspecified dementia without behavioral disturbance: Secondary | ICD-10-CM | POA: Diagnosis not present

## 2022-08-01 DIAGNOSIS — S0181XA Laceration without foreign body of other part of head, initial encounter: Secondary | ICD-10-CM | POA: Diagnosis not present

## 2022-08-01 DIAGNOSIS — F209 Schizophrenia, unspecified: Secondary | ICD-10-CM | POA: Diagnosis not present

## 2022-08-01 DIAGNOSIS — E669 Obesity, unspecified: Secondary | ICD-10-CM | POA: Diagnosis not present

## 2022-08-01 DIAGNOSIS — Z7982 Long term (current) use of aspirin: Secondary | ICD-10-CM | POA: Diagnosis not present

## 2022-08-01 DIAGNOSIS — I10 Essential (primary) hypertension: Secondary | ICD-10-CM | POA: Diagnosis not present

## 2022-08-01 DIAGNOSIS — B029 Zoster without complications: Secondary | ICD-10-CM | POA: Diagnosis not present

## 2022-08-01 DIAGNOSIS — E119 Type 2 diabetes mellitus without complications: Secondary | ICD-10-CM | POA: Diagnosis not present

## 2022-08-01 DIAGNOSIS — R45851 Suicidal ideations: Secondary | ICD-10-CM | POA: Diagnosis not present

## 2022-08-02 DIAGNOSIS — F0390 Unspecified dementia without behavioral disturbance: Secondary | ICD-10-CM | POA: Diagnosis not present

## 2022-08-02 DIAGNOSIS — B029 Zoster without complications: Secondary | ICD-10-CM | POA: Diagnosis not present

## 2022-08-02 DIAGNOSIS — R45851 Suicidal ideations: Secondary | ICD-10-CM | POA: Diagnosis not present

## 2022-08-02 DIAGNOSIS — E119 Type 2 diabetes mellitus without complications: Secondary | ICD-10-CM | POA: Diagnosis not present

## 2022-08-02 DIAGNOSIS — I10 Essential (primary) hypertension: Secondary | ICD-10-CM | POA: Diagnosis not present

## 2022-08-02 DIAGNOSIS — S0181XA Laceration without foreign body of other part of head, initial encounter: Secondary | ICD-10-CM | POA: Diagnosis not present

## 2022-08-02 DIAGNOSIS — F209 Schizophrenia, unspecified: Secondary | ICD-10-CM | POA: Diagnosis not present

## 2022-08-02 DIAGNOSIS — E669 Obesity, unspecified: Secondary | ICD-10-CM | POA: Diagnosis not present

## 2022-08-02 DIAGNOSIS — Z7982 Long term (current) use of aspirin: Secondary | ICD-10-CM | POA: Diagnosis not present

## 2022-08-03 DIAGNOSIS — F209 Schizophrenia, unspecified: Secondary | ICD-10-CM | POA: Diagnosis not present

## 2022-08-03 DIAGNOSIS — E119 Type 2 diabetes mellitus without complications: Secondary | ICD-10-CM | POA: Diagnosis not present

## 2022-08-03 DIAGNOSIS — I10 Essential (primary) hypertension: Secondary | ICD-10-CM | POA: Diagnosis not present

## 2022-08-03 DIAGNOSIS — E669 Obesity, unspecified: Secondary | ICD-10-CM | POA: Diagnosis not present

## 2022-08-03 DIAGNOSIS — B029 Zoster without complications: Secondary | ICD-10-CM | POA: Diagnosis not present

## 2022-08-03 DIAGNOSIS — Z7982 Long term (current) use of aspirin: Secondary | ICD-10-CM | POA: Diagnosis not present

## 2022-08-03 DIAGNOSIS — F0390 Unspecified dementia without behavioral disturbance: Secondary | ICD-10-CM | POA: Diagnosis not present

## 2022-08-03 DIAGNOSIS — R45851 Suicidal ideations: Secondary | ICD-10-CM | POA: Diagnosis not present

## 2022-08-03 DIAGNOSIS — S0181XA Laceration without foreign body of other part of head, initial encounter: Secondary | ICD-10-CM | POA: Diagnosis not present

## 2022-08-04 DIAGNOSIS — E669 Obesity, unspecified: Secondary | ICD-10-CM | POA: Diagnosis not present

## 2022-08-04 DIAGNOSIS — I10 Essential (primary) hypertension: Secondary | ICD-10-CM | POA: Diagnosis not present

## 2022-08-04 DIAGNOSIS — F209 Schizophrenia, unspecified: Secondary | ICD-10-CM | POA: Diagnosis not present

## 2022-08-04 DIAGNOSIS — Z7982 Long term (current) use of aspirin: Secondary | ICD-10-CM | POA: Diagnosis not present

## 2022-08-04 DIAGNOSIS — S0181XA Laceration without foreign body of other part of head, initial encounter: Secondary | ICD-10-CM | POA: Diagnosis not present

## 2022-08-04 DIAGNOSIS — F0390 Unspecified dementia without behavioral disturbance: Secondary | ICD-10-CM | POA: Diagnosis not present

## 2022-08-04 DIAGNOSIS — B029 Zoster without complications: Secondary | ICD-10-CM | POA: Diagnosis not present

## 2022-08-04 DIAGNOSIS — E119 Type 2 diabetes mellitus without complications: Secondary | ICD-10-CM | POA: Diagnosis not present

## 2022-08-04 DIAGNOSIS — R45851 Suicidal ideations: Secondary | ICD-10-CM | POA: Diagnosis not present

## 2022-08-05 DIAGNOSIS — R45851 Suicidal ideations: Secondary | ICD-10-CM | POA: Diagnosis not present

## 2022-08-05 DIAGNOSIS — S0181XA Laceration without foreign body of other part of head, initial encounter: Secondary | ICD-10-CM | POA: Diagnosis not present

## 2022-08-05 DIAGNOSIS — I10 Essential (primary) hypertension: Secondary | ICD-10-CM | POA: Diagnosis not present

## 2022-08-05 DIAGNOSIS — E119 Type 2 diabetes mellitus without complications: Secondary | ICD-10-CM | POA: Diagnosis not present

## 2022-08-05 DIAGNOSIS — F0390 Unspecified dementia without behavioral disturbance: Secondary | ICD-10-CM | POA: Diagnosis not present

## 2022-08-05 DIAGNOSIS — Z7982 Long term (current) use of aspirin: Secondary | ICD-10-CM | POA: Diagnosis not present

## 2022-08-05 DIAGNOSIS — B029 Zoster without complications: Secondary | ICD-10-CM | POA: Diagnosis not present

## 2022-08-05 DIAGNOSIS — E669 Obesity, unspecified: Secondary | ICD-10-CM | POA: Diagnosis not present

## 2022-08-05 DIAGNOSIS — F209 Schizophrenia, unspecified: Secondary | ICD-10-CM | POA: Diagnosis not present

## 2022-08-06 DIAGNOSIS — B029 Zoster without complications: Secondary | ICD-10-CM | POA: Diagnosis not present

## 2022-08-06 DIAGNOSIS — R45851 Suicidal ideations: Secondary | ICD-10-CM | POA: Diagnosis not present

## 2022-08-06 DIAGNOSIS — F209 Schizophrenia, unspecified: Secondary | ICD-10-CM | POA: Diagnosis not present

## 2022-08-06 DIAGNOSIS — Z7982 Long term (current) use of aspirin: Secondary | ICD-10-CM | POA: Diagnosis not present

## 2022-08-06 DIAGNOSIS — E669 Obesity, unspecified: Secondary | ICD-10-CM | POA: Diagnosis not present

## 2022-08-06 DIAGNOSIS — E119 Type 2 diabetes mellitus without complications: Secondary | ICD-10-CM | POA: Diagnosis not present

## 2022-08-06 DIAGNOSIS — F0390 Unspecified dementia without behavioral disturbance: Secondary | ICD-10-CM | POA: Diagnosis not present

## 2022-08-06 DIAGNOSIS — I10 Essential (primary) hypertension: Secondary | ICD-10-CM | POA: Diagnosis not present

## 2022-08-06 DIAGNOSIS — S0181XA Laceration without foreign body of other part of head, initial encounter: Secondary | ICD-10-CM | POA: Diagnosis not present

## 2022-08-07 DIAGNOSIS — F209 Schizophrenia, unspecified: Secondary | ICD-10-CM | POA: Diagnosis not present

## 2022-08-07 DIAGNOSIS — E119 Type 2 diabetes mellitus without complications: Secondary | ICD-10-CM | POA: Diagnosis not present

## 2022-08-07 DIAGNOSIS — Z7982 Long term (current) use of aspirin: Secondary | ICD-10-CM | POA: Diagnosis not present

## 2022-08-07 DIAGNOSIS — E669 Obesity, unspecified: Secondary | ICD-10-CM | POA: Diagnosis not present

## 2022-08-07 DIAGNOSIS — F0390 Unspecified dementia without behavioral disturbance: Secondary | ICD-10-CM | POA: Diagnosis not present

## 2022-08-07 DIAGNOSIS — R45851 Suicidal ideations: Secondary | ICD-10-CM | POA: Diagnosis not present

## 2022-08-07 DIAGNOSIS — S0181XA Laceration without foreign body of other part of head, initial encounter: Secondary | ICD-10-CM | POA: Diagnosis not present

## 2022-08-07 DIAGNOSIS — I10 Essential (primary) hypertension: Secondary | ICD-10-CM | POA: Diagnosis not present

## 2022-08-07 DIAGNOSIS — B029 Zoster without complications: Secondary | ICD-10-CM | POA: Diagnosis not present

## 2022-08-09 ENCOUNTER — Telehealth: Payer: Self-pay

## 2022-08-09 NOTE — Transitions of Care (Post Inpatient/ED Visit) (Signed)
   08/09/2022  Name: Mitchell [name] MRN: [id number] DOB: Aug 27, 1960  Today's TOC FU Call Status: Today's TOC FU Call Status:: Unsuccessul Call (1st Attempt) Unsuccessful Call (1st Attempt) Date: 08/09/22  Attempted to reach the patient regarding the most recent Inpatient/ED visit.  Follow Up Plan: Additional outreach attempts will be made to reach the patient to complete the Transitions of Care (Post Inpatient/ED visit) call.   Signature  Agnes Lawrence, CMA (AAMA)  [facility] [phone]

## 2022-08-10 NOTE — Transitions of Care (Post Inpatient/ED Visit) (Signed)
   08/10/2022  Name: Mitchell [name] MRN: [id number] DOB: 10/03/1960  Today's TOC FU Call Status: Today's TOC FU Call Status:: Unsuccessful Call (2nd Attempt) Unsuccessful Call (1st Attempt) Date: 08/09/22 Unsuccessful Call (2nd Attempt) Date: 08/10/22  Attempted to reach the patient regarding the most recent Inpatient/ED visit.  Follow Up Plan: Additional outreach attempts will be made to reach the patient to complete the Transitions of Care (Post Inpatient/ED visit) call.   Signature Agnes Lawrence, CMA (AAMA)  [facility] [phone]

## 2022-08-26 NOTE — Transitions of Care (Post Inpatient/ED Visit) (Signed)
   08/26/2022  Name: Mitchell [name] MRN: [id number] DOB: 14-Jun-1960  Today's TOC FU Call Status: Today's TOC FU Call Status:: Successful TOC FU Call Competed Unsuccessful Call (1st Attempt) Date: 08/09/22 Unsuccessful Call (2nd Attempt) Date: 08/10/22 Unsuccessful Call (3rd Attempt) Date: 08/26/22 [facility] FU Call Complete Date: 08/26/22  Attempted to reach the patient regarding the most recent Inpatient/ED visit.  Follow Up Plan: No further outreach attempts will be made at this time. We have been unable to contact the patient.  Signature Agnes Lawrence, CMA (AAMA)  [facility] [phone]

## 2022-09-16 ENCOUNTER — Other Ambulatory Visit: Payer: Self-pay | Admitting: Family Medicine

## 2022-09-22 ENCOUNTER — Telehealth: Payer: Self-pay | Admitting: Family Medicine

## 2022-09-22 ENCOUNTER — Other Ambulatory Visit: Payer: Self-pay | Admitting: Family Medicine

## 2022-09-22 NOTE — Telephone Encounter (Signed)
Mitchell [name] called from [facility] need med refill Lancets  Pharmacy: [facility]

## 2022-09-23 ENCOUNTER — Other Ambulatory Visit: Payer: Self-pay | Admitting: Family Medicine

## 2022-09-23 MED ORDER — LANCETS 30G MISC: 3 refills | Status: DC

## 2022-09-23 NOTE — Telephone Encounter (Signed)
Lancet rx sent

## 2022-09-28 ENCOUNTER — Ambulatory Visit (INDEPENDENT_AMBULATORY_CARE_PROVIDER_SITE_OTHER): Payer: Medicare PPO | Admitting: Podiatry

## 2022-09-28 DIAGNOSIS — M79674 Pain in right toe(s): Secondary | ICD-10-CM

## 2022-09-28 DIAGNOSIS — B351 Tinea unguium: Secondary | ICD-10-CM | POA: Diagnosis not present

## 2022-09-28 DIAGNOSIS — M79675 Pain in left toe(s): Secondary | ICD-10-CM

## 2022-09-28 NOTE — Progress Notes (Signed)
    Subjective:  Patient ID: Mitchell [name], male    DOB: 1961-01-04,  MRN: [id number]   Mitchell [name] presents to clinic today for:  Chief Complaint  Patient presents with   Nail Problem    [facility] pt states there are no conerns  . Patient notes nails are thick, discolored, elongated and painful in shoegear when trying to ambulate.    PCP is Del Newman Nip, Tenna Child, FNP.  No Known Allergies  Review of Systems: Negative except as noted in the HPI.  Objective:  Mitchell [name] is a pleasant 62 y.o. male in NAD. AAO x 3.  Vascular Examination: Capillary refill time is 3-5 seconds to toes bilateral. Palpable pedal pulses b/l LE. Digital hair present b/l. No pedal edema b/l. Skin temperature gradient WNL b/l. No varicosities b/l. No cyanosis or clubbing noted b/l.   Dermatological Examination: Pedal skin with normal turgor, texture and tone b/l. No open wounds. No interdigital macerations b/l. Toenails x10 are 3mm thick, discolored, dystrophic with subungual debris. There is pain with compression of the nail plates.  They are elongated x10.  Skin is dry and flaky bilateral legs and feet     Latest Ref Rng & Units 04/27/2022    8:32 AM 01/06/2022   10:52 AM  Hemoglobin A1C  Hemoglobin-A1c 4.8 - 5.6 % 8.3  9.5    Assessment/Plan: 1. Pain due to onychomycosis of toenails of both feet    The mycotic toenails were sharply debrided x10 with sterile nail nippers and a power debriding burr to decrease bulk/thickness and length.  Stressed the importance of proper blood glucose control with the patient.  Recommend daily moisturizing of the skin on the legs and feet.  Return in about 4 months (around 01/29/2023) for RFC.   Clerance Lav, DPM, FACFAS [facility]     [address] [place], [place] [address]                Office [phone]  Fax [phone]

## 2022-10-05 ENCOUNTER — Other Ambulatory Visit: Payer: Self-pay | Admitting: Family Medicine

## 2022-10-12 DIAGNOSIS — Z5181 Encounter for therapeutic drug level monitoring: Secondary | ICD-10-CM | POA: Diagnosis not present

## 2022-10-15 DIAGNOSIS — Z5181 Encounter for therapeutic drug level monitoring: Secondary | ICD-10-CM | POA: Diagnosis not present

## 2022-10-20 DIAGNOSIS — Z5181 Encounter for therapeutic drug level monitoring: Secondary | ICD-10-CM | POA: Diagnosis not present

## 2022-10-27 ENCOUNTER — Other Ambulatory Visit (HOSPITAL_BASED_OUTPATIENT_CLINIC_OR_DEPARTMENT_OTHER): Payer: Self-pay

## 2022-10-27 ENCOUNTER — Other Ambulatory Visit (HOSPITAL_COMMUNITY): Payer: Self-pay

## 2022-10-27 ENCOUNTER — Other Ambulatory Visit: Payer: Self-pay | Admitting: Family Medicine

## 2022-10-28 DIAGNOSIS — Z5181 Encounter for therapeutic drug level monitoring: Secondary | ICD-10-CM | POA: Diagnosis not present

## 2022-11-03 DIAGNOSIS — Z5181 Encounter for therapeutic drug level monitoring: Secondary | ICD-10-CM | POA: Diagnosis not present

## 2022-11-03 DIAGNOSIS — F329 Major depressive disorder, single episode, unspecified: Secondary | ICD-10-CM | POA: Diagnosis not present

## 2022-11-05 DIAGNOSIS — Z5181 Encounter for therapeutic drug level monitoring: Secondary | ICD-10-CM | POA: Diagnosis not present

## 2022-11-09 DIAGNOSIS — F329 Major depressive disorder, single episode, unspecified: Secondary | ICD-10-CM | POA: Diagnosis not present

## 2022-11-09 DIAGNOSIS — Z5181 Encounter for therapeutic drug level monitoring: Secondary | ICD-10-CM | POA: Diagnosis not present

## 2022-11-12 ENCOUNTER — Telehealth: Payer: Self-pay | Admitting: Family Medicine

## 2022-11-12 DIAGNOSIS — Z5181 Encounter for therapeutic drug level monitoring: Secondary | ICD-10-CM | POA: Diagnosis not present

## 2022-11-12 NOTE — Telephone Encounter (Signed)
Mary from [facility] LVM says she is needing all active Rx for patient faxed over- was not provided with a fax # only a call back # [phone] Thank you

## 2022-11-12 NOTE — Telephone Encounter (Signed)
Called back, no answer. Need to know what medications and fax number.

## 2022-11-15 DIAGNOSIS — J111 Influenza due to unidentified influenza virus with other respiratory manifestations: Secondary | ICD-10-CM | POA: Diagnosis not present

## 2022-11-19 ENCOUNTER — Other Ambulatory Visit: Payer: Self-pay | Admitting: Family Medicine

## 2022-11-22 DIAGNOSIS — J111 Influenza due to unidentified influenza virus with other respiratory manifestations: Secondary | ICD-10-CM | POA: Diagnosis not present

## 2022-11-25 DIAGNOSIS — Z5181 Encounter for therapeutic drug level monitoring: Secondary | ICD-10-CM | POA: Diagnosis not present

## 2022-11-25 DIAGNOSIS — F329 Major depressive disorder, single episode, unspecified: Secondary | ICD-10-CM | POA: Diagnosis not present

## 2022-11-26 DIAGNOSIS — Z5181 Encounter for therapeutic drug level monitoring: Secondary | ICD-10-CM | POA: Diagnosis not present

## 2022-12-01 ENCOUNTER — Ambulatory Visit: Payer: Medicare PPO | Admitting: Podiatry

## 2022-12-02 ENCOUNTER — Encounter: Payer: Self-pay | Admitting: *Deleted

## 2022-12-02 ENCOUNTER — Encounter (INDEPENDENT_AMBULATORY_CARE_PROVIDER_SITE_OTHER): Payer: Self-pay | Admitting: *Deleted

## 2022-12-02 DIAGNOSIS — Z5181 Encounter for therapeutic drug level monitoring: Secondary | ICD-10-CM | POA: Diagnosis not present

## 2022-12-02 DIAGNOSIS — F329 Major depressive disorder, single episode, unspecified: Secondary | ICD-10-CM | POA: Diagnosis not present

## 2022-12-03 DIAGNOSIS — Z5181 Encounter for therapeutic drug level monitoring: Secondary | ICD-10-CM | POA: Diagnosis not present

## 2022-12-06 DIAGNOSIS — J111 Influenza due to unidentified influenza virus with other respiratory manifestations: Secondary | ICD-10-CM | POA: Diagnosis not present

## 2022-12-08 ENCOUNTER — Telehealth: Payer: Self-pay | Admitting: Family Medicine

## 2022-12-08 NOTE — Telephone Encounter (Signed)
Called patient to schedule DRS in our office. Patient has an new patient appointment with [facility] [phone] and fax # [phone] in October 2024.

## 2022-12-09 DIAGNOSIS — F329 Major depressive disorder, single episode, unspecified: Secondary | ICD-10-CM | POA: Diagnosis not present

## 2022-12-09 DIAGNOSIS — Z5181 Encounter for therapeutic drug level monitoring: Secondary | ICD-10-CM | POA: Diagnosis not present

## 2022-12-10 DIAGNOSIS — Z5181 Encounter for therapeutic drug level monitoring: Secondary | ICD-10-CM | POA: Diagnosis not present

## 2022-12-14 ENCOUNTER — Other Ambulatory Visit: Payer: Self-pay | Admitting: Family Medicine

## 2022-12-16 DIAGNOSIS — Z5181 Encounter for therapeutic drug level monitoring: Secondary | ICD-10-CM | POA: Diagnosis not present

## 2022-12-16 DIAGNOSIS — F329 Major depressive disorder, single episode, unspecified: Secondary | ICD-10-CM | POA: Diagnosis not present

## 2022-12-17 DIAGNOSIS — J111 Influenza due to unidentified influenza virus with other respiratory manifestations: Secondary | ICD-10-CM | POA: Diagnosis not present

## 2022-12-17 DIAGNOSIS — Z5181 Encounter for therapeutic drug level monitoring: Secondary | ICD-10-CM | POA: Diagnosis not present

## 2022-12-23 DIAGNOSIS — Z5181 Encounter for therapeutic drug level monitoring: Secondary | ICD-10-CM | POA: Diagnosis not present

## 2022-12-23 DIAGNOSIS — F329 Major depressive disorder, single episode, unspecified: Secondary | ICD-10-CM | POA: Diagnosis not present

## 2023-01-11 DIAGNOSIS — E11319 Type 2 diabetes mellitus with unspecified diabetic retinopathy without macular edema: Secondary | ICD-10-CM | POA: Diagnosis not present

## 2023-01-11 DIAGNOSIS — H524 Presbyopia: Secondary | ICD-10-CM | POA: Diagnosis not present

## 2023-01-26 ENCOUNTER — Other Ambulatory Visit: Payer: Self-pay | Admitting: Family Medicine

## 2023-02-01 ENCOUNTER — Encounter (INDEPENDENT_AMBULATORY_CARE_PROVIDER_SITE_OTHER): Payer: Medicare HMO | Admitting: Podiatry

## 2023-02-01 DIAGNOSIS — Z91199 Patient's noncompliance with other medical treatment and regimen due to unspecified reason: Secondary | ICD-10-CM

## 2023-02-01 NOTE — Progress Notes (Signed)
Patient was a no-show for his scheduled appointment today.

## 2023-02-03 ENCOUNTER — Other Ambulatory Visit: Payer: Self-pay | Admitting: Family Medicine

## 2023-02-11 ENCOUNTER — Other Ambulatory Visit: Payer: Self-pay | Admitting: Family Medicine

## 2023-02-16 ENCOUNTER — Ambulatory Visit: Payer: Medicare HMO | Admitting: Podiatry

## 2023-02-23 ENCOUNTER — Ambulatory Visit: Payer: Medicare HMO | Admitting: Podiatry

## 2023-02-28 ENCOUNTER — Other Ambulatory Visit: Payer: Self-pay | Admitting: Family Medicine

## 2023-04-15 ENCOUNTER — Other Ambulatory Visit: Payer: Self-pay | Admitting: Family Medicine

## 2023-04-15 ENCOUNTER — Other Ambulatory Visit: Payer: Self-pay

## 2023-06-20 ENCOUNTER — Ambulatory Visit: Payer: 59

## 2023-07-06 ENCOUNTER — Other Ambulatory Visit: Payer: Self-pay | Admitting: Family Medicine

## 2023-08-02 ENCOUNTER — Other Ambulatory Visit: Payer: Self-pay | Admitting: Family Medicine

## 2023-09-01 ENCOUNTER — Emergency Department (HOSPITAL_COMMUNITY)
Admission: EM | Admit: 2023-09-01 | Discharge: 2023-09-04 | Attending: Emergency Medicine | Admitting: Emergency Medicine

## 2023-09-01 ENCOUNTER — Other Ambulatory Visit: Payer: Self-pay

## 2023-09-01 ENCOUNTER — Encounter (HOSPITAL_COMMUNITY): Payer: Self-pay | Admitting: Psychiatry

## 2023-09-01 DIAGNOSIS — S20312A Abrasion of left front wall of thorax, initial encounter: Secondary | ICD-10-CM | POA: Diagnosis not present

## 2023-09-01 DIAGNOSIS — Z9151 Personal history of suicidal behavior: Secondary | ICD-10-CM | POA: Insufficient documentation

## 2023-09-01 DIAGNOSIS — Z7984 Long term (current) use of oral hypoglycemic drugs: Secondary | ICD-10-CM | POA: Insufficient documentation

## 2023-09-01 DIAGNOSIS — S0181XA Laceration without foreign body of other part of head, initial encounter: Secondary | ICD-10-CM | POA: Diagnosis not present

## 2023-09-01 DIAGNOSIS — S50811A Abrasion of right forearm, initial encounter: Secondary | ICD-10-CM | POA: Diagnosis not present

## 2023-09-01 DIAGNOSIS — Z794 Long term (current) use of insulin: Secondary | ICD-10-CM | POA: Insufficient documentation

## 2023-09-01 DIAGNOSIS — F259 Schizoaffective disorder, unspecified: Secondary | ICD-10-CM | POA: Diagnosis not present

## 2023-09-01 DIAGNOSIS — Z7982 Long term (current) use of aspirin: Secondary | ICD-10-CM | POA: Diagnosis not present

## 2023-09-01 DIAGNOSIS — T1491XA Suicide attempt, initial encounter: Secondary | ICD-10-CM | POA: Diagnosis present

## 2023-09-01 DIAGNOSIS — R45851 Suicidal ideations: Secondary | ICD-10-CM

## 2023-09-01 DIAGNOSIS — F209 Schizophrenia, unspecified: Secondary | ICD-10-CM | POA: Diagnosis present

## 2023-09-01 DIAGNOSIS — I10 Essential (primary) hypertension: Secondary | ICD-10-CM | POA: Diagnosis not present

## 2023-09-01 DIAGNOSIS — S0081XA Abrasion of other part of head, initial encounter: Secondary | ICD-10-CM | POA: Insufficient documentation

## 2023-09-01 DIAGNOSIS — F419 Anxiety disorder, unspecified: Secondary | ICD-10-CM | POA: Diagnosis not present

## 2023-09-01 DIAGNOSIS — X788XXA Intentional self-harm by other sharp object, initial encounter: Secondary | ICD-10-CM | POA: Diagnosis not present

## 2023-09-01 DIAGNOSIS — S50812A Abrasion of left forearm, initial encounter: Secondary | ICD-10-CM | POA: Diagnosis not present

## 2023-09-01 DIAGNOSIS — F430 Acute stress reaction: Secondary | ICD-10-CM

## 2023-09-01 DIAGNOSIS — E119 Type 2 diabetes mellitus without complications: Secondary | ICD-10-CM | POA: Diagnosis not present

## 2023-09-01 DIAGNOSIS — Z79899 Other long term (current) drug therapy: Secondary | ICD-10-CM | POA: Insufficient documentation

## 2023-09-01 LAB — COMPREHENSIVE METABOLIC PANEL WITH GFR
ALT: 31 U/L (ref 0–44)
AST: 26 U/L (ref 15–41)
Albumin: 4.3 g/dL (ref 3.5–5.0)
Alkaline Phosphatase: 127 U/L — ABNORMAL HIGH (ref 38–126)
Anion gap: 12 (ref 5–15)
BUN: 10 mg/dL (ref 8–23)
CO2: 21 mmol/L — ABNORMAL LOW (ref 22–32)
Calcium: 9.1 mg/dL (ref 8.9–10.3)
Chloride: 99 mmol/L (ref 98–111)
Creatinine, Ser: 1.15 mg/dL (ref 0.61–1.24)
GFR, Estimated: >60 mL/min (ref >60)
Glucose, Bld: 396 mg/dL — ABNORMAL HIGH (ref 70–99)
Potassium: 4 mmol/L (ref 3.5–5.1)
Sodium: 132 mmol/L — ABNORMAL LOW (ref 135–145)
Total Bilirubin: 0.6 mg/dL (ref 0.0–1.2)
Total Protein: 8.1 g/dL (ref 6.5–8.1)

## 2023-09-01 LAB — CBC
HCT: 38.5 % — ABNORMAL LOW (ref 39.0–52.0)
Hemoglobin: 13.7 g/dL (ref 13.0–17.0)
MCH: 32.9 pg (ref 26.0–34.0)
MCHC: 35.6 g/dL (ref 30.0–36.0)
MCV: 92.3 fL (ref 80.0–100.0)
Platelets: 237 10*3/uL (ref 150–400)
RBC: 4.17 MIL/uL — ABNORMAL LOW (ref 4.22–5.81)
RDW: 13.1 % (ref 11.5–15.5)
WBC: 7.1 10*3/uL (ref 4.0–10.5)
nRBC: 0 % (ref 0.0–0.2)

## 2023-09-01 LAB — RAPID URINE DRUG SCREEN, HOSP PERFORMED
Amphetamines: NOT DETECTED
Barbiturates: NOT DETECTED
Benzodiazepines: NOT DETECTED
Cocaine: NOT DETECTED
Opiates: NOT DETECTED
Tetrahydrocannabinol: NOT DETECTED

## 2023-09-01 LAB — CBG MONITORING, ED
Glucose-Capillary: 369 mg/dL — ABNORMAL HIGH (ref 70–99)
Glucose-Capillary: 357 mg/dL — ABNORMAL HIGH (ref 70–99)

## 2023-09-01 LAB — ETHANOL: Alcohol, Ethyl (B): <15 mg/dL (ref <15)

## 2023-09-01 MED ORDER — SIMETHICONE 80 MG PO CHEW
80.0000 mg | CHEWABLE_TABLET | Freq: Once | ORAL | Status: AC
  Administered 2023-09-01: 80 mg via ORAL
  Filled 2023-09-01 (×2): qty 1

## 2023-09-01 MED ORDER — MELATONIN 3 MG PO TABS
3.0000 mg | ORAL_TABLET | Freq: Every day | ORAL | Status: DC
  Administered 2023-09-01 – 2023-09-03 (×3): 3 mg via ORAL
  Filled 2023-09-01 (×3): qty 1

## 2023-09-01 MED ORDER — INSULIN ASPART 100 UNIT/ML IV SOLN
10.0000 [IU] | Freq: Once | INTRAVENOUS | Status: AC
  Administered 2023-09-01: 10 [IU] via SUBCUTANEOUS

## 2023-09-01 NOTE — Consult Note (Incomplete)
 [facility] Consult Note  Patient Name: Mitchell [name] MRN: [id number] DOB: [birth year] DATE OF Consult: 09/01/2023  PRIMARY PSYCHIATRIC DIAGNOSES  1.  *** 2.  *** 3.  ***  RECOMMENDATIONS  {Recommendations:304550007::"Medication recommendations: ***","Non-Medication/therapeutic recommendations: ***","Communication: Treatment team members (and family members if applicable) who were involved in treatment/care discussions and planning, and with whom we spoke or engaged with via secure text/chat, include the following: ***"}  Thank you for involving us  in the care of this patient. If you have any additional questions or concerns, please call [phone] and ask for me or the provider on-call.  TELEPSYCHIATRY ATTESTATION & CONSENT  As the provider for this telehealth consult, I attest that I verified the patient's identity using two separate identifiers, introduced myself to the patient, provided my credentials, disclosed my location, and performed this encounter via a HIPAA-compliant, real-time, face-to-face, two-way, interactive audio and video platform and with the full consent and agreement of the patient (or guardian as applicable.)  Patient physical location: ***. Telehealth provider physical location: home office in state of ***.  Video start time: *** (Central Time) Video end time: *** (Central Time)  IDENTIFYING DATA  Mitchell [name] is a 63 y.o. year-old male for whom a psychiatric consultation has been ordered by the primary provider. The patient was identified using two separate identifiers.  CHIEF COMPLAINT/REASON FOR CONSULT  ***  HISTORY OF PRESENT ILLNESS (HPI)  The patient ***.  PAST PSYCHIATRIC HISTORY  *** Otherwise as per HPI above.  PAST MEDICAL HISTORY  Past Medical History:  Diagnosis Date  . Bipolar affective disorder (HCC)    takes Zyprexa  daily  . Coarse tremors 10/02/2014  . Diabetes mellitus    takes Victoza ,Metformin ,and Glipizide  daily  . Hypertension     takes Amlodipine ,Lisinopril  and Clonidine  daily  . Hyponatremia    history of  . 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 Mental disorder    takes Lithium  daily  . Schizoaffective disorder    takes Trazodone  nightly  . Schizoaffective disorder (HCC) 01/04/2019  . Seasonal allergies    takes Claritin  daily  . Sleep apnea    sleep study >45yrs ago  . Stroke [facility])    left arm weakness   ***  HOME MEDICATIONS  Facility Ordered Medications  Medication  . [COMPLETED] insulin  aspart (novoLOG ) injection 10 Units   PTA Medications  Medication Sig  . aspirin  EC 81 MG tablet Take 1 tablet (81 mg total) by mouth daily. Swallow whole.  . atenolol  (TENORMIN ) 25 MG tablet TAKE (1) TABLET BY MOUTH ONCE DAILY.  . cloNIDine  (CATAPRES ) 0.2 MG tablet TAKE (1) TABLET BY MOUTH TWICE DAILY.  . donepezil  (ARICEPT ) 5 MG tablet TAKE (1) TABLET BY MOUTH AT BEDTIME. (Patient taking differently: Take 10 mg by mouth at bedtime.)  . metFORMIN  (GLUCOPHAGE ) 1000 MG tablet TAKE (1) TABLET BY MOUTH TWICE DAILY.  Aaron Aas senna (SENOKOT) 8.6 MG TABS tablet TAKE (1) TABLET BY MOUTH ONCE DAILY.  Aaron Aas VRAYLAR 1.5 MG capsule TAKE (1) CAPSULE BY MOUTH ONCE DAILY.  . amLODipine  (NORVASC ) 10 MG tablet TAKE (1) TABLET BY MOUTH AT BEDTIME.  . lisinopril  (ZESTRIL ) 20 MG tablet TAKE (1) TABLET BY MOUTH AT BEDTIME.  . insulin  lispro (HUMALOG ) 100 UNIT/ML KwikPen Inject 10 Units into the skin with breakfast, with lunch, and with evening meal. Hold with BP < 150 Notify provider if > 400.  Aaron Aas ALLERGY RELIEF 10 MG tablet Take 10 mg by mouth daily.  . medroxyPROGESTERone (PROVERA) 10 MG tablet Take by mouth  See admin instructions. Take 2 tablets (20 mg) daily  . haloperidol  (HALDOL ) 5 MG tablet Take 1 tablet (5 mg total) by mouth 2 (two) times daily. (Patient taking differently: Take 7.5 mg by mouth at bedtime.)  . cloZAPine  (CLOZARIL ) 25 MG tablet Take 1-3 tablets (25-75 mg total) by mouth See admin instructions. Taking 25 mg in the AM and 75 mg at  bedtime  . DROPSAFE SAFETY PEN NEEDLES 31G X 6 MM MISC USE AS DIRECTED WITH INSULIN  PENS FOUR TIMES DAILY.  Aaron Aas Lancets 30G MISC USE AS DIRECTED TO TEST BS LEVELS FOUR TIMES DAILY.  Aaron Aas Blood Glucose Monitoring Suppl (ACCU-CHEK GUIDE) w/Device KIT USE AS DIRECTED TO TEST BS LEVELS FOUR TIMES DAILY.  Aaron Aas ACCU-CHEK GUIDE TEST test strip USE AS DIRECTED TO TEST BLOOD SUGAR LEVELS 4 TIMES DAILY.   ***  ALLERGIES  No Known Allergies  SOCIAL & SUBSTANCE USE HISTORY  Social History   Socioeconomic History  . Marital status: Divorced    Spouse name: Not on file  . Number of children: Not on file  . Years of education: Not on file  . Highest education level: Not on file  Occupational History  . Not on file  Tobacco Use  . Smoking status: Never  . Smokeless tobacco: Never  Vaping Use  . Vaping status: Never Used  Substance and Sexual Activity  . Alcohol use: Not Currently  . Drug use: No  . Sexual activity: Yes    Birth control/protection: None  Other Topics Concern  . Not on file  Social History Narrative  . Not on file   Social Drivers of Health   Financial Resource Strain: Low Risk  (05/28/2022)   Overall Financial Resource Strain (CARDIA)   . Difficulty of Paying Living Expenses: Not hard at all  Food Insecurity: No Food Insecurity (05/28/2022)   Hunger Vital Sign   . Worried About Programme researcher, broadcasting/film/video in the Last Year: Never true   . Ran Out of Food in the Last Year: Never true  Transportation Needs: Unmet Transportation Needs (05/28/2022)   PRAPARE - Transportation   . Lack of Transportation (Medical): Yes   . Lack of Transportation (Non-Medical): No  Physical Activity: Insufficiently Active (05/28/2022)   Exercise Vital Sign   . Days of Exercise per Week: 2 days   . Minutes of Exercise per Session: 30 min  Stress: No Stress Concern Present (05/28/2022)   [employer] of Occupational Health - Occupational Stress Questionnaire   . Feeling of Stress : Not at all  Social  Connections: Unknown (05/28/2022)   Social Connection and Isolation Panel [NHANES]   . Frequency of Communication with Friends and Family: Twice a week   . Frequency of Social Gatherings with Friends and Family: Once a week   . Attends Religious Services: More than 4 times per year   . Active Member of Clubs or Organizations: Yes   . Attends Banker Meetings: 1 to 4 times per year   . Marital Status: Not on file   Social History   Tobacco Use  Smoking Status Never  Smokeless Tobacco Never   Social History   Substance and Sexual Activity  Alcohol Use Not Currently   Social History   Substance and Sexual Activity  Drug Use No    Additional pertinent information ***.  FAMILY HISTORY  History reviewed. No pertinent family history. Family Psychiatric History (if known):  ***  MENTAL STATUS EXAM (MSE)  Mental Status Exam: General  Appearance: {Appearance:22683}  Orientation:  {BHH ORIENTATION (PAA):22689}  Memory:  {BHH MEMORY:22881}  Concentration:  {Concentration:21399}  Recall:  {BHH GOOD/FAIR/POOR:22877}  Attention  {BH Attention Span:31825}  Eye Contact:  {BHH EYE CONTACT:22684}  Speech:  {Speech:22685}  Language:  {BHH GOOD/FAIR/POOR:22877}  Volume:  {Volume (PAA):22686}  Mood: ***  Affect:  {Affect (PAA):22687}  Thought Process:  {Thought Process (PAA):22688}  Thought Content:  {Thought Content:22690}  Suicidal Thoughts:  {ST/HT (PAA):22692}  Homicidal Thoughts:  {ST/HT (PAA):22692}  Judgement:  {Judgement (PAA):22694}  Insight:  {Insight (PAA):22695}  Psychomotor Activity:  {Psychomotor (PAA):22696}  Akathisia:  {BHH YES OR NO:22294}  Fund of Knowledge:  {BHH GOOD/FAIR/POOR:22877}    Assets:  {Assets (PAA):22698}  Cognition:  {chl bhh cognition:304700322}  ADL's:  {BHH ZOX'W:96045}  AIMS (if indicated):       VITALS  Blood pressure 138/87, pulse (!) 102, temperature 98.7 F (37.1 C), temperature source Oral, resp. rate 16, SpO2 100%.  LABS   Admission on 09/01/2023  Component Date Value Ref Range Status  . Sodium 09/01/2023 132 (L)  135 - 145 mmol/L Final  . Potassium 09/01/2023 4.0  3.5 - 5.1 mmol/L Final  . Chloride 09/01/2023 99  98 - 111 mmol/L Final  . CO2 09/01/2023 21 (L)  22 - 32 mmol/L Final  . Glucose, Bld 09/01/2023 396 (H)  70 - 99 mg/dL Final   Glucose reference range applies only to samples taken after fasting for at least 8 hours.  . BUN 09/01/2023 10  8 - 23 mg/dL Final  . Creatinine, Ser 09/01/2023 1.15  0.61 - 1.24 mg/dL Final  . Calcium  09/01/2023 9.1  8.9 - 10.3 mg/dL Final  . Total Protein 09/01/2023 8.1  6.5 - 8.1 g/dL Final  . Albumin 40/98/1191 4.3  3.5 - 5.0 g/dL Final  . AST 47/82/9562 26  15 - 41 U/L Final  . ALT 09/01/2023 31  0 - 44 U/L Final  . Alkaline Phosphatase 09/01/2023 127 (H)  38 - 126 U/L Final  . Total Bilirubin 09/01/2023 0.6  0.0 - 1.2 mg/dL Final  . GFR, Estimated 09/01/2023 >60  >60 mL/min Final   Comment: (NOTE) Calculated using the CKD-EPI Creatinine Equation (2021)   . Anion gap 09/01/2023 12  5 - 15 Final   Performed at [facility], [address] [place], [place] [address]  . Alcohol, Ethyl (B) 09/01/2023 <15  <15 mg/dL Final   Comment: (NOTE) For medical purposes only. Performed at [facility], [address] [place], [place] [address]   . WBC 09/01/2023 7.1  4.0 - 10.5 K/uL Final  . RBC 09/01/2023 4.17 (L)  4.22 - 5.81 MIL/uL Final  . Hemoglobin 09/01/2023 13.7  13.0 - 17.0 g/dL Final  . HCT 96/29/5284 38.5 (L)  39.0 - 52.0 % Final  . MCV 09/01/2023 92.3  80.0 - 100.0 fL Final  . MCH 09/01/2023 32.9  26.0 - 34.0 pg Final  . MCHC 09/01/2023 35.6  30.0 - 36.0 g/dL Final  . RDW 13/24/4010 13.1  11.5 - 15.5 % Final  . Platelets 09/01/2023 237  150 - 400 K/uL Final  . nRBC 09/01/2023 0.0  0.0 - 0.2 % Final   Performed at [facility], [address] [place], [place] [address]  . Opiates 09/01/2023 NONE DETECTED  NONE DETECTED Final  . Cocaine 09/01/2023 NONE  DETECTED  NONE DETECTED Final  . Benzodiazepines 09/01/2023 NONE DETECTED  NONE DETECTED Final  . Amphetamines 09/01/2023 NONE DETECTED  NONE DETECTED Final  .  Tetrahydrocannabinol 09/01/2023 NONE DETECTED  NONE DETECTED Final  . Barbiturates 09/01/2023 NONE DETECTED  NONE DETECTED Final   Comment: (NOTE) DRUG SCREEN FOR MEDICAL PURPOSES ONLY.  IF CONFIRMATION IS NEEDED FOR ANY PURPOSE, NOTIFY LAB WITHIN 5 DAYS.  LOWEST DETECTABLE LIMITS FOR URINE DRUG SCREEN Drug Class                     Cutoff (ng/mL) Amphetamine and metabolites    1000 Barbiturate and metabolites    200 Benzodiazepine                 200 Opiates and metabolites        300 Cocaine and metabolites        300 THC                            50 Performed at [facility], [address], [place], [place] [address]   . Glucose-Capillary 09/01/2023 369 (H)  70 - 99 mg/dL Final   Glucose reference range applies only to samples taken after fasting for at least 8 hours.  . Glucose-Capillary 09/01/2023 357 (H)  70 - 99 mg/dL Final   Glucose reference range applies only to samples taken after fasting for at least 8 hours.    PSYCHIATRIC REVIEW OF SYSTEMS (ROS)  ROS: Notable for the following relevant positive findings: ROS  Additional findings:      Musculoskeletal: {Musculoskeletal neeeds/assessment:304550014}      Gait & Station: {Gait and Station:304550016}      Pain Screening: {Pain Description:304550015}      Nutrition & Dental Concerns: {Nutrition & Dental Concerns:304550017}  RISK FORMULATION/ASSESSMENT  Is the patient experiencing any suicidal or homicidal ideations: {yes/no:20286}       Explain if yes: *** Protective factors considered for safety management: ***  Risk factors/concerns considered for safety management: *** {CHL BH Risk Factors Safety Management:304550011}  Is there a safety management plan with the patient and treatment team to minimize risk factors and promote protective factors:  {yes/no:20286}           Explain: *** Is crisis care placement or psychiatric hospitalization recommended: {yes/no:20286}     Based on my current evaluation and risk assessment, patient is determined at this time to be at:  {Risk level:304550009}  *RISK ASSESSMENT Risk assessment is a dynamic process; it is possible that this patient's condition, and risk level, may change. This should be re-evaluated and managed over time as appropriate. Please re-consult psychiatric consult services if additional assistance is needed in terms of risk assessment and management. If your team decides to discharge this patient, please advise the patient how to best access emergency psychiatric services, or to call 911, if their condition worsens or they feel unsafe in any way.   Donney Caraveo A Malcome Ambrocio, NP Telepsychiatry Consult Services

## 2023-09-01 NOTE — ED Notes (Signed)
 Pt dressed out and UA given

## 2023-09-01 NOTE — BH Assessment (Signed)
 This consult has been RE-deferred to [facility]. [facility] coordinator to update secure chat when assessment  Per Austine Lefort at [facility], patient is scheduled to be seen at 6pm EST (5pm CST).  Aviance Cooperwood T. Kipp People, MS, [facility], [facility] Triage Specialist [facility]

## 2023-09-01 NOTE — BH Assessment (Signed)
 Clinician spoke to [facility] with [facility] to complete pt's TTS assessment. Clinician provided pt's name, MRN, location, age, room number and provider's name. Secure message completed.    [facility] coordinator to update secure chat when assessment time and provider are assigned.   Redmond Pulling, MS, [facility], [facility] Triage Specialist [phone]

## 2023-09-01 NOTE — ED Notes (Signed)
 ED Provider at bedside.

## 2023-09-01 NOTE — ED Notes (Signed)
 Pt wanded by security  Lab at bedside

## 2023-09-01 NOTE — ED Notes (Signed)
 Pt ambulated to restroom independently with steady gait.

## 2023-09-01 NOTE — ED Provider Notes (Signed)
 [facility]MENT AT [facility] Provider Note   CSN: [id number] Arrival date & time: 09/01/23  0013     History  Chief Complaint  Patient presents with   Suicidal    Mitchell [name] is a 63 y.o. male.  Patient is a 63 year old male with past medical history of type 2 diabetes, schizophrenia, hypertension.  Patient brought for evaluation of suicidal ideation/self-harm.  Patient is living in a group home and had some sort of disagreement with other residents there.  He states that he felt suicidal and made multiple superficial cuts to his forearms, left chest, and forehead with a razor blade.  He admits to wanting to harm himself.       Home Medications Prior to Admission medications   Medication Sig Start Date End Date Taking? Authorizing Provider  ACCU-CHEK GUIDE TEST test strip USE AS DIRECTED TO TEST BLOOD SUGAR LEVELS 4 TIMES DAILY. 08/02/23   Zarwolo, Gloria, FNP  Alcohol Swabs (B-D SINGLE USE SWABS REGULAR) PADS SMARTSIG:2 Topical Twice Daily 05/10/22   [provider]  amLODipine  (NORVASC ) 10 MG tablet TAKE (1) TABLET BY MOUTH AT BEDTIME. 01/26/23   Del Amber Bail, Rogerio [place], FNP  aspirin  EC 81 MG tablet Take 1 tablet (81 mg total) by mouth daily. Swallow whole. 06/10/22   Del Abron Abt, FNP  atenolol  (TENORMIN ) 25 MG tablet TAKE (1) TABLET BY MOUTH ONCE DAILY. 09/16/22   Del Abron Abt, FNP  benzonatate  (TESSALON  PERLES) 100 MG capsule Take 1 capsule (100 mg total) by mouth 3 (three) times daily as needed for cough. 05/18/22   Towanda Fret, MD  Blood Glucose Monitoring Suppl (ACCU-CHEK GUIDE) w/Device KIT USE AS DIRECTED TO TEST BS LEVELS FOUR TIMES DAILY. 07/06/23   Del Orbe Polanco, Rogerio [place], FNP  cloNIDine  (CATAPRES ) 0.2 MG tablet TAKE (1) TABLET BY MOUTH TWICE DAILY. 10/27/22   Del Orbe Polanco, Iliana, FNP  cloZAPine  (CLOZARIL ) 25 MG tablet Take 1-3 tablets (25-75 mg total) by mouth See admin instructions. Taking 25 mg in  the AM and 75 mg at bedtime 02/04/22 03/06/22  Haviland, Julie, MD  donepezil  (ARICEPT ) 5 MG tablet TAKE (1) TABLET BY MOUTH AT BEDTIME. 10/27/22   Del Orbe Polanco, Iliana, FNP  DROPSAFE SAFETY PEN NEEDLES 31G X 6 MM MISC USE AS DIRECTED WITH INSULIN  PENS FOUR TIMES DAILY. 06/14/22   Del Abron Abt, FNP  fluticasone  (FLONASE ) 50 MCG/ACT nasal spray Place 2 sprays into both nostrils daily. 05/28/22   Del Orbe Polanco, Iliana, FNP  haloperidol  (HALDOL ) 5 MG tablet Take 1 tablet (5 mg total) by mouth 2 (two) times daily. 02/04/22 03/06/22  Haviland, Julie, MD  insulin  glargine (LANTUS ) 100 UNIT/ML injection Inject 0.15 mLs (15 Units total) into the skin 2 (two) times daily. **Hold if Blood Sugar  levels less than 150** 06/28/22 09/03/22  Del Orbe Polanco, Rogerio [place], FNP  Lancets 30G MISC USE AS DIRECTED TO TEST BS LEVELS FOUR TIMES DAILY. 02/11/23   Del Amber Bail, Rogerio [place], FNP  lisinopril  (ZESTRIL ) 20 MG tablet TAKE (1) TABLET BY MOUTH AT BEDTIME. 03/01/23   Del Orbe Polanco, Rogerio [place], FNP  lithium  carbonate (ESKALITH ) 450 MG ER tablet Take 450 mg by mouth 2 (two) times daily. 02/16/22   [provider]  metFORMIN  (GLUCOPHAGE ) 1000 MG tablet TAKE (1) TABLET BY MOUTH TWICE DAILY. 10/27/22   Del Orbe Polanco, Rogerio [place], FNP  senna (SENOKOT) 8.6 MG TABS tablet TAKE (1) TABLET BY MOUTH ONCE DAILY. 10/27/22  Del Orbe Polanco, Camanche, FNP  VRAYLAR 1.5 MG capsule TAKE (1) CAPSULE BY MOUTH ONCE DAILY. 10/27/22   Del Orbe Polanco, Iliana, FNP      Allergies    Patient has no known allergies.    Review of Systems   Review of Systems  All other systems reviewed and are negative.   Physical Exam Updated Vital Signs There were no vitals taken for this visit. Physical Exam Vitals and nursing note reviewed.  Constitutional:      General: He is not in acute distress.    Appearance: He is well-developed. He is not diaphoretic.  HENT:     Head: Normocephalic and atraumatic.  Cardiovascular:     Rate and  Rhythm: Normal rate and regular rhythm.     Heart sounds: No murmur heard.    No friction rub.  Pulmonary:     Effort: Pulmonary effort is normal. No respiratory distress.     Breath sounds: Normal breath sounds. No wheezing or rales.  Abdominal:     General: Bowel sounds are normal. There is no distension.     Palpations: Abdomen is soft.     Tenderness: There is no abdominal tenderness.  Musculoskeletal:        General: Normal range of motion.     Cervical back: Normal range of motion and neck supple.  Skin:    General: Skin is warm and dry.     Comments: There are multiple superficial abrasions/lacerations noted to the center of his forehead, both forearms, and left upper chest.  These are all superficial and not in need of repair.  Neurological:     Mental Status: He is alert and oriented to person, place, and time.     Coordination: Coordination normal.     ED Results / Procedures / Treatments   Labs (all labs ordered are listed, but only abnormal results are displayed) Labs Reviewed  COMPREHENSIVE METABOLIC PANEL WITH GFR  ETHANOL  CBC  RAPID URINE DRUG SCREEN, HOSP PERFORMED    EKG None  Radiology No results found.  Procedures Procedures    Medications Ordered in ED Medications - No data to display  ED Course/ Medical Decision Making/ A&P  Patient is a 63 year old male brought for evaluation of suicidal ideation and self mutilating behavior.  Patient became upset with the members of his group home and states that he attempted to harm himself by cutting himself with a razor.  He has multiple abrasions that are superficial to both forearms, his forehead, and left chest.  Laboratory studies obtained including CBC, CMP, and serum ethanol.  His blood sugar is 400, but laboratory studies otherwise unremarkable.  No evidence for DKA.    Patient has been given subcutaneous NovoLog  with some improvement of his sugars.  Patient appears medically cleared for TTS  evaluation.  Final Clinical Impression(s) / ED Diagnoses Final diagnoses:  None    Rx / DC Orders ED Discharge Orders     None         Orvilla Blander, MD 09/01/23 [phone]

## 2023-09-01 NOTE — ED Notes (Signed)
 Pt requested something to help him sleep & for gas relief. EDP made aware.

## 2023-09-01 NOTE — ED Triage Notes (Signed)
 Pt bib [employer] from [facility]. Per officer pt cut his wrists to try to end his life tonight because he was upset with other residents. Pt states people that he lives with are spreading rumors that he has AIDS and crabs so no one will ever want to have sex with him again. Pt admits to wanting to kill himself tonight but states he does not want to die now. Only wants people to "stop being rude".   Pt has superficial cuts to both forearms, dressing in place. Bleeding controlled

## 2023-09-01 NOTE — ED Notes (Signed)
 Called [facility] for medications from pharmacy

## 2023-09-02 DIAGNOSIS — T1491XA Suicide attempt, initial encounter: Secondary | ICD-10-CM | POA: Diagnosis not present

## 2023-09-02 DIAGNOSIS — F259 Schizoaffective disorder, unspecified: Secondary | ICD-10-CM | POA: Diagnosis not present

## 2023-09-02 LAB — CBG MONITORING, ED
Glucose-Capillary: 334 mg/dL — ABNORMAL HIGH (ref 70–99)
Glucose-Capillary: 376 mg/dL — ABNORMAL HIGH (ref 70–99)
Glucose-Capillary: 295 mg/dL — ABNORMAL HIGH (ref 70–99)
Glucose-Capillary: 194 mg/dL — ABNORMAL HIGH (ref 70–99)

## 2023-09-02 LAB — HEMOGLOBIN A1C
Hgb A1c MFr Bld: 10.7 % — ABNORMAL HIGH (ref 4.8–5.6)
Mean Plasma Glucose: 260.39 mg/dL

## 2023-09-02 MED ORDER — LORATADINE 10 MG PO TABS
10.0000 mg | ORAL_TABLET | Freq: Every day | ORAL | Status: DC
  Administered 2023-09-02 – 2023-09-04 (×3): 10 mg via ORAL
  Filled 2023-09-02 (×3): qty 1

## 2023-09-02 MED ORDER — CLONIDINE HCL 0.2 MG PO TABS
0.2000 mg | ORAL_TABLET | Freq: Two times a day (BID) | ORAL | Status: DC
  Administered 2023-09-02 – 2023-09-04 (×5): 0.2 mg via ORAL
  Filled 2023-09-02 (×5): qty 1

## 2023-09-02 MED ORDER — LISINOPRIL 10 MG PO TABS
20.0000 mg | ORAL_TABLET | Freq: Every day | ORAL | Status: DC
  Administered 2023-09-02 – 2023-09-03 (×2): 20 mg via ORAL
  Filled 2023-09-02 (×2): qty 2

## 2023-09-02 MED ORDER — ASPIRIN 81 MG PO TBEC
81.0000 mg | DELAYED_RELEASE_TABLET | Freq: Every day | ORAL | Status: DC
  Administered 2023-09-02 – 2023-09-04 (×3): 81 mg via ORAL
  Filled 2023-09-02 (×3): qty 1

## 2023-09-02 MED ORDER — INSULIN ASPART 100 UNIT/ML IJ SOLN
10.0000 [IU] | Freq: Three times a day (TID) | INTRAMUSCULAR | Status: DC
  Administered 2023-09-02 – 2023-09-04 (×7): 10 [IU] via SUBCUTANEOUS
  Filled 2023-09-02 (×7): qty 1

## 2023-09-02 MED ORDER — SENNA 8.6 MG PO TABS
1.0000 | ORAL_TABLET | Freq: Every day | ORAL | Status: DC
  Administered 2023-09-02 – 2023-09-04 (×3): 8.6 mg via ORAL
  Filled 2023-09-02 (×3): qty 1

## 2023-09-02 MED ORDER — INSULIN ASPART 100 UNIT/ML IJ SOLN
0.0000 [IU] | Freq: Three times a day (TID) | INTRAMUSCULAR | Status: DC
  Administered 2023-09-02: 8 [IU] via SUBCUTANEOUS
  Administered 2023-09-03: 2 [IU] via SUBCUTANEOUS
  Administered 2023-09-03 (×2): 8 [IU] via SUBCUTANEOUS
  Administered 2023-09-04: 3 [IU] via SUBCUTANEOUS
  Filled 2023-09-02 (×4): qty 1

## 2023-09-02 MED ORDER — ATENOLOL 25 MG PO TABS
25.0000 mg | ORAL_TABLET | Freq: Every day | ORAL | Status: DC
  Administered 2023-09-02 – 2023-09-04 (×3): 25 mg via ORAL
  Filled 2023-09-02 (×3): qty 1

## 2023-09-02 MED ORDER — HALOPERIDOL 5 MG PO TABS
7.5000 mg | ORAL_TABLET | Freq: Every day | ORAL | Status: DC
  Administered 2023-09-02 – 2023-09-03 (×2): 7.5 mg via ORAL
  Filled 2023-09-02 (×2): qty 2

## 2023-09-02 MED ORDER — MEDROXYPROGESTERONE ACETATE 5 MG PO TABS
20.0000 mg | ORAL_TABLET | Freq: Every day | ORAL | Status: DC
  Administered 2023-09-02 – 2023-09-04 (×3): 20 mg via ORAL
  Filled 2023-09-02: qty 4
  Filled 2023-09-02: qty 2
  Filled 2023-09-02: qty 4
  Filled 2023-09-02: qty 2

## 2023-09-02 MED ORDER — CLOZAPINE 25 MG PO TABS: 25.0000 mg | ORAL_TABLET | ORAL | Status: DC

## 2023-09-02 MED ORDER — METFORMIN HCL 500 MG PO TABS
1000.0000 mg | ORAL_TABLET | Freq: Two times a day (BID) | ORAL | Status: DC
  Administered 2023-09-02 – 2023-09-04 (×5): 1000 mg via ORAL
  Filled 2023-09-02 (×5): qty 2

## 2023-09-02 MED ORDER — DONEPEZIL HCL 5 MG PO TABS
10.0000 mg | ORAL_TABLET | Freq: Every day | ORAL | Status: DC
  Administered 2023-09-02 – 2023-09-03 (×2): 10 mg via ORAL
  Filled 2023-09-02 (×2): qty 2

## 2023-09-02 MED ORDER — AMLODIPINE BESYLATE 5 MG PO TABS
10.0000 mg | ORAL_TABLET | Freq: Every day | ORAL | Status: DC
  Administered 2023-09-02 – 2023-09-03 (×2): 10 mg via ORAL
  Filled 2023-09-02 (×2): qty 2

## 2023-09-02 MED ORDER — CARIPRAZINE HCL 1.5 MG PO CAPS
1.5000 mg | ORAL_CAPSULE | Freq: Every day | ORAL | Status: DC
  Administered 2023-09-02 – 2023-09-03 (×2): 1.5 mg via ORAL
  Filled 2023-09-02 (×2): qty 1

## 2023-09-02 MED ORDER — INSULIN GLARGINE-YFGN 100 UNIT/ML ~~LOC~~ SOLN
20.0000 [IU] | Freq: Every day | SUBCUTANEOUS | Status: DC
  Administered 2023-09-02: 20 [IU] via SUBCUTANEOUS
  Filled 2023-09-02 (×2): qty 0.2

## 2023-09-02 NOTE — ED Provider Notes (Signed)
 Emergency Medicine Observation Re-evaluation Note  Mitchell [name] is a 63 y.o. male, seen on rounds today.  Pt initially presented to the ED for complaints of Suicidal Currently, the patient is talking to psychiatry.  Physical Exam  BP (!) 150/91 (BP Location: Left Arm)   Pulse 94   Temp 98.7 F (37.1 C) (Oral)   Resp 18   SpO2 98%  Physical Exam General: no distress Lungs: normal effort Psych: no agitation  ED Course / MDM  EKG:   I have reviewed the labs performed to date as well as medications administered while in observation.  Recent changes in the last 24 hours include home meds ordered this morning.  Plan  Current plan is for psychiatric re-evaluation.    Jerilynn Montenegro, MD 09/02/23 [phone]

## 2023-09-02 NOTE — Consult Note (Signed)
 [facility] Psychiatric Consult Initial  Patient Name: .Mitchell [name]  MRN: [id number]  DOB: [birth year]  Consult Order details:  Orders (From admission, onward)     Start     Ordered   09/01/23 0126  CONSULT TO CALL ACT TEAM       Ordering Provider: Orvilla Blander, MD  Provider:  (Not yet assigned)  Question:  Reason for Consult?  Answer:  SI/self mutilation   09/01/23 0125             Mode of Visit: Tele-visit Location of Provider [facility]. Patient is located at [facility].    Psychiatry Consult Evaluation  Service Date: September 02, 2023 LOS:  LOS: 0 days  Chief Complaint   Primary Psychiatric Diagnoses  Schizophrenia  2.   Suicidal behavior with attempted self injury    Assessment  Mitchell [name] is a 63 y.o. male admitted: Presented to the EDfor 09/01/2023 12:31 AM for an evaluation for suicidal ideation/self-harm behaviors by cutting his forehead, bilateral forearms and chest with a razor. He carries the psychiatric diagnoses of schizoaffective, and schizophrenia and has a past medical history of diabetes type 2.   His current presentation of suicidal behaviors by self inflicted injury and delusional and paranoia thought content is most consistent with suicidal behavior with attempted self injury and schizophrenia. He meets criteria for inpatient psychiatric treatment based on suicidal thoughts and self harm behaviors. On initial examination, patient is alert and oriented to person, place, time and situation. His thought process is linear. Thought content is positive for paranoia and delusions and negative for SI/HI/AVH. Objectively, patient does not appear to be responding to internal or external stimuli. His speech is coherent at norma rate. His mood is anxious and affect is appropriate. He is cooperative and does not appear to be in acute distress.   Please see plan below for detailed recommendations.   Diagnoses:  Active Hospital problems: Principal Problem:   Suicidal behavior  with attempted self-injury [facility]) Active Problems:   Schizophrenia (HCC)    Plan   ## Psychiatric Medication Recommendations:  Pharmacy to verify home medications and EDP to restart home psychotropic medications. Will attempt to verify home psychotropic meds when I obtain collateral information from [place] (group home owner) at [facility].  ## Medical Decision Making Capacity: Not specifically addressed in this encounter  ## Further Work-up:  -- add EKG, patient is prescribed antipsychotic medications known to prolong QTC.  -- most recent EKG on 02/08/22 had QtC of 452 -- Pertinent labwork reviewed earlier this admission includes: CBC, CMP, BAL, UDS, EKG, CBG  ## Disposition:-- We recommend inpatient psychiatric hospitalization when medically cleared. Patient is under voluntary admission status at this time; please IVC if attempts to leave hospital. Case and plan of care discussed with Dr. Brenita Callow.   ## Behavioral / Environmental: -Recommend using specific terminology regarding PNES, i.e. call the episodes "non-epileptic seizures" rather than "pseudoseizures" as the latter insinuates "fake" or "feigned" symptoms, when the events are a very real experience to the patient and are a physical, non-volitional, manifestation of fear, pain and anxiety.     ## Safety and Observation Level:  - Based on my clinical evaluation, I estimate the patient to be at high risk of self harm in the current setting. - At this time, we recommend  1:1 Observation. This decision is based on my review of the chart including patient's history and current presentation, interview of the patient, mental status examination, and consideration of suicide  risk including evaluating suicidal ideation, plan, intent, suicidal or self-harm behaviors, risk factors, and protective factors. This judgment is based on our ability to directly address suicide risk, implement suicide prevention strategies, and develop a safety  plan while the patient is in the clinical setting. Please contact our team if there is a concern that risk level has changed.  CSSR Risk Category:C-SSRS RISK CATEGORY: High Risk  Suicide Risk Assessment: Patient has following modifiable risk factors for suicide: suicidal thoughts and self harm behaviors which we are addressing by recommending inpatient psychiatric treatment.  Patient has following non-modifiable or demographic risk factors for suicide: male gender, history of suicide attempt, history of self harm behavior, and psychiatric hospitalization Patient has the following protective factors against suicide: supportive environment at group home.  Thank you for this consult request. Recommendations have been communicated to the primary team.  We will continue to follow at this time.   Gwenlyn Lento, NP       History of Present Illness  Relevant Aspects of Hospital ED Course:  Admitted on 09/01/2023 for an evaluation for suicidal ideation/self-harm behaviors by cutting his forehead, bilateral forearms and chest with a razor.   Patient Report:  Patient states that he cut himself up yesterday using a blade last night, 09/01/2023. He reports cutting himself all over his body, bilateral arms, chest and face in an attempt to kill himself. 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 He currently denies suicidal thoughts at this time and states that he relies it is not worth killing himself and does not want to die. He reports that instead of harming himself, the next time he could call the police. He reports a prior suicide attempt at age 27 by trying to use an electrical wire. He denies a history of self-harm behaviors. He denies homicidal thoughts. He denies auditory or visual hallucinations. 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 He denies symptoms of depression or mania. He reports feeling anxious because he wants to live on his own and that does not want to live at the group home. He states that he has been living at the group home for the past 4 years. Per chart review, patient resides at the [facility]. He reports fair sleep. He reports a good appetite. He denies drinking alcohol or using illicit drugs. He denies outpatient psychiatry. He denies outpatient counseling. He denies taking psychotropic medications although he has a history of being prescribed Clozapine .  Psych ROS:  Depression: Patient denies depressive symptoms  Anxiety: Patient reports feeling anxious  Mania (lifetime and current): Patient denies manic symptoms  Psychosis: (lifetime and current): history of delusions and paranoia. Thought content positive for delusions and paranoia   Collateral information:  Contacted Erich Haus at [phone] 09/02/23, no answer, left HIPAA compliant voicemail  Psychiatric and Social History  Psychiatric History:  Information collected from chart review.   Prev Dx/Sx: History of schizoaffective disorder and schizophrenia Current Psych Provider: Patient denies outpatient psychiatry Home Meds (current): Unable to verify her medications Previous Med Trials: Per chart review, patient has been prescribed Haldol , Aricept ,clozapine , lithium , Invega , and clonidine   Therapy: Patient denies outpatient  therapy  Prior Psych Hospitalization: Several inpatient hospitalizations at [facility] on 04/23/2021, 01/28/2021, 01/04/2019, and [facility] 07/18/18.  Prior Self Harm: documented history of self injurious behaviors by cutting.  Prior Violence: No known history Family Psych History: No reported history Family Hx suicide:  reported suicide attempt at age 50.  Social History:  Occupational Hx: Unemployed and is receiving disability Legal Hx: No known history Living Situation: Patient currently resides at a group home, [facility] Access to weapons/lethal means: No  Substance History Alcohol: Patient denies drinking alcohol Illicit drugs: Patient denies using illicit drugs.   Exam Findings  Physical Exam: Patient alert and oriented x 4. Patient mildly hypotensive. Patient is slightly tachycardic. Vital Signs:  Temp:  [98.3 F (36.8 C)-98.7 F (37.1 C)] 98.3 F (36.8 C) (06/06 0853) Pulse Rate:  [94-110] 110 (06/06 0853) Resp:  [17-18] 17 (06/06 0853) BP: (150-151)/(91-99) 151/99 (06/06 0853) SpO2:  [93 %-98 %] 93 % (06/06 0853) Blood pressure (!) 151/99, pulse (!) 110, temperature 98.3 F (36.8 C), temperature source Oral, resp. rate 17, SpO2 93%. There is no height or weight on file to calculate BMI.  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  Family History:  The patient's family history is not on file.  Medical History: Past Medical History:  Diagnosis Date  . Bipolar affective disorder (HCC)    takes  Zyprexa  daily  . Coarse tremors 10/02/2014  . Diabetes mellitus    takes Victoza ,Metformin ,and Glipizide  daily  . Hypertension    takes Amlodipine ,Lisinopril  and Clonidine  daily  . Hyponatremia    history of  . 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 Mental disorder    takes Lithium  daily  . Schizoaffective disorder    takes Trazodone  nightly  . Schizoaffective disorder (HCC) 01/04/2019  . Seasonal allergies    takes Claritin  daily  . Sleep apnea    sleep study >44yrs ago  . Stroke [facility])    left arm weakness    Surgical History: Past Surgical History:  Procedure Laterality Date  . CATARACT EXTRACTION W/PHACO Right 02/14/2013   Procedure: CATARACT EXTRACTION PHACO AND INTRAOCULAR LENS PLACEMENT (IOC);  Surgeon: Jewel Mortimer, MD;  Location: [facility] OR;  Service: Ophthalmology;  Laterality: Right;  . CATARACT EXTRACTION W/PHACO Left 06/13/2013   Procedure: CATARACT EXTRACTION PHACO AND INTRAOCULAR LENS PLACEMENT (IOC);  Surgeon: Jewel Mortimer, MD;  Location: [facility] OR;  Service: Ophthalmology;  Laterality: Left;  . CIRCUMCISION  20 yrs. ago  . EYE SURGERY       Medications:   Current Facility-Administered Medications:  .  amLODipine  (NORVASC ) tablet 10 mg,  10 mg, Oral, QHS, Jerilynn Montenegro, MD .  aspirin  EC tablet 81 mg, 81 mg, Oral, Daily, Goldston, Scott, MD .  atenolol  (TENORMIN ) tablet 25 mg, 25 mg, Oral, Daily, Goldston, Scott, MD .  cariprazine (VRAYLAR) capsule 1.5 mg, 1.5 mg, Oral, Daily, Goldston, Scott, MD .  cloNIDine  (CATAPRES ) tablet 0.2 mg, 0.2 mg, Oral, BID, Goldston, Scott, MD .  cloZAPine  (CLOZARIL ) tablet 25-75 mg, 25-75 mg, Oral, See admin instructions, Jerilynn Montenegro, MD .  donepezil  (ARICEPT ) tablet 10 mg, 10 mg, Oral, QHS, Goldston, Scott, MD .  haloperidol  (HALDOL ) tablet 7.5 mg, 7.5 mg, Oral, QHS, Goldston, Scott, MD .  insulin  aspart (novoLOG ) injection 10 Units, 10 Units, Subcutaneous, TID with meals, Goldston, Scott, MD .  insulin  glargine-yfgn (SEMGLEE ) injection 20  Units, 20 Units, Subcutaneous, QHS, Goldston, Scott, MD .  lisinopril  (ZESTRIL ) tablet 20 mg, 20 mg, Oral, QHS, Goldston, Scott, MD .  loratadine  (CLARITIN ) tablet 10 mg, 10 mg, Oral, Daily, Goldston, Scott, MD .  medroxyPROGESTERone (PROVERA) tablet 20 mg, 20 mg, Oral, Daily, Goldston, Scott, MD .  melatonin tablet 3 mg, 3 mg, Oral, QHS, Mordecai Applebaum, MD, 3 mg at 09/01/23 2338 .  metFORMIN  (GLUCOPHAGE ) tablet 1,000 mg, 1,000 mg, Oral, BID WC, Goldston, Scott, MD .  senna (SENOKOT) tablet 8.6 mg, 1 tablet, Oral, Daily, Goldston, Scott, MD  Current Outpatient Medications:  .  ALLERGY RELIEF 10 MG tablet, Take 10 mg by mouth daily., Disp: , Rfl:  .  amLODipine  (NORVASC ) 10 MG tablet, TAKE (1) TABLET BY MOUTH AT BEDTIME., Disp: 30 tablet, Rfl: 10 .  aspirin  EC 81 MG tablet, Take 1 tablet (81 mg total) by mouth daily. Swallow whole., Disp: 30 tablet, Rfl: 2 .  atenolol  (TENORMIN ) 25 MG tablet, TAKE (1) TABLET BY MOUTH ONCE DAILY., Disp: 30 tablet, Rfl: 10 .  cloNIDine  (CATAPRES ) 0.2 MG tablet, TAKE (1) TABLET BY MOUTH TWICE DAILY., Disp: 60 tablet, Rfl: 10 .  donepezil  (ARICEPT ) 5 MG tablet, TAKE (1) TABLET BY MOUTH AT BEDTIME. (Patient taking differently: Take 10 mg by mouth at bedtime.), Disp: 30 tablet, Rfl: 10 .  Insulin  Glargine (BASAGLAR  KWIKPEN) 100 UNIT/ML, Inject 20 Units into the skin at bedtime., Disp: , Rfl:  .  insulin  lispro (HUMALOG ) 100 UNIT/ML KwikPen, Inject 10 Units into the skin with breakfast, with lunch, and with evening meal. Hold with BP < 150 Notify provider if > 400., Disp: , Rfl:  .  lisinopril  (ZESTRIL ) 20 MG tablet, TAKE (1) TABLET BY MOUTH AT BEDTIME., Disp: 30 tablet, Rfl: 10 .  medroxyPROGESTERone (PROVERA) 10 MG tablet, Take by mouth See admin instructions. Take 2 tablets (20 mg) daily, Disp: , Rfl:  .  metFORMIN  (GLUCOPHAGE ) 1000 MG tablet, TAKE (1) TABLET BY MOUTH TWICE DAILY., Disp: 60 tablet, Rfl: 10 .  senna (SENOKOT) 8.6 MG TABS tablet, TAKE (1) TABLET BY  MOUTH ONCE DAILY., Disp: 30 tablet, Rfl: 10 .  VRAYLAR 1.5 MG capsule, TAKE (1) CAPSULE BY MOUTH ONCE DAILY., Disp: 30 capsule, Rfl: 10 .  ACCU-CHEK GUIDE TEST test strip, USE AS DIRECTED TO TEST BLOOD SUGAR LEVELS 4 TIMES DAILY., Disp: 100 strip, Rfl: 0 .  Blood Glucose Monitoring Suppl (ACCU-CHEK GUIDE) w/Device KIT, USE AS DIRECTED TO TEST BS LEVELS FOUR TIMES DAILY., Disp: 1 kit, Rfl: 0 .  cloZAPine  (CLOZARIL ) 25 MG tablet, Take 1-3 tablets (25-75 mg total) by mouth See admin instructions. Taking 25 mg in the AM and 75 mg at bedtime, Disp: 90 tablet, Rfl: 0 .  DROPSAFE SAFETY PEN NEEDLES 31G X 6 MM MISC, USE AS DIRECTED WITH INSULIN  PENS FOUR TIMES DAILY., Disp: 100 each, Rfl: 11 .  haloperidol  (HALDOL ) 5 MG tablet, Take 1 tablet (5 mg total) by mouth 2 (two) times daily. (Patient taking differently: Take 7.5 mg by mouth at bedtime.), Disp: , Rfl:  .  Lancets 30G MISC, USE AS DIRECTED TO TEST BS LEVELS FOUR TIMES DAILY., Disp: 150 each, Rfl: 0  Allergies: No Known Allergies  Joyice Magda L, NP

## 2023-09-02 NOTE — Progress Notes (Signed)
 Patient has been recommended for IP psych treatment by telehealth. Patient has eaten taken medications and is resting quietly at this time.

## 2023-09-02 NOTE — Progress Notes (Addendum)
 Per Halina Lever is accepting patient for tomorrow pending 3 consecutive blood sugar levels under 250.     Patient has been accepted to [facility] on 09/03/2023 Bed Assignment: Main Campus   Pt meets inpatient criteria per: Gwenlyn Lento, NP   Attending Physician will be:  Lavona Pounds MD   Report can be called to:  [phone] (Option 2)   Pt can arrive tomorrow pending 3 consecutive blood sugar levels under 250   Care Team Notified: Gwenlyn Lento, NP, [address], RN     [place], [facility] [phone]

## 2023-09-02 NOTE — Progress Notes (Signed)
 Per [facility], patient was referred to the following facilities:   Service Provider Address Phone  [facility] [address], [place] [address] [phone]  [facility] [address] [place] [address] [phone]  [facility] [address] [place], [place] [address] [phone]  [facility] [address] [place] [address] [phone]  [facility] [address] [place] [address] [phone]  [facility] [address], [place] [address] [phone]  [facility] [address] [place] [address] [phone]  [facility] [address], [place] [phone]  [facility] [address] [place] [address] [phone]  [facility] [address] [place], [place] [address] [phone]  [facility] [address] [place] [address] [phone], [facility], [place] [phone]

## 2023-09-02 NOTE — ED Notes (Signed)
 Pt ambulated to restroom independently with steady gait. No complaints at this time. States his stomach pain has "eased off."
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
## 2023-09-03 DIAGNOSIS — F259 Schizoaffective disorder, unspecified: Secondary | ICD-10-CM | POA: Diagnosis not present

## 2023-09-03 LAB — CBG MONITORING, ED
Glucose-Capillary: 269 mg/dL — ABNORMAL HIGH (ref 70–99)
Glucose-Capillary: 341 mg/dL — ABNORMAL HIGH (ref 70–99)
Glucose-Capillary: 263 mg/dL — ABNORMAL HIGH (ref 70–99)
Glucose-Capillary: 169 mg/dL — ABNORMAL HIGH (ref 70–99)
Glucose-Capillary: 150 mg/dL — ABNORMAL HIGH (ref 70–99)
Glucose-Capillary: 244 mg/dL — ABNORMAL HIGH (ref 70–99)

## 2023-09-03 MED ORDER — ZIPRASIDONE MESYLATE 20 MG IM SOLR
20.0000 mg | Freq: Once | INTRAMUSCULAR | Status: AC
  Administered 2023-09-03: 20 mg via INTRAMUSCULAR
  Filled 2023-09-03: qty 20

## 2023-09-03 MED ORDER — CARIPRAZINE HCL 1.5 MG PO CAPS
3.0000 mg | ORAL_CAPSULE | Freq: Every day | ORAL | Status: DC
  Administered 2023-09-04: 3 mg via ORAL
  Filled 2023-09-03: qty 2

## 2023-09-03 MED ORDER — STERILE WATER FOR INJECTION IJ SOLN
INTRAMUSCULAR | Status: AC
  Filled 2023-09-03: qty 10

## 2023-09-03 MED ORDER — INSULIN GLARGINE-YFGN 100 UNIT/ML ~~LOC~~ SOLN
25.0000 [IU] | Freq: Every day | SUBCUTANEOUS | Status: DC
  Administered 2023-09-03: 25 [IU] via SUBCUTANEOUS
  Filled 2023-09-03 (×2): qty 0.25
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
## 2023-09-03 NOTE — ED Notes (Signed)
 Pt returned to room

## 2023-09-03 NOTE — ED Notes (Addendum)
 Pt verbalized he wants to kill himself, standing in the corner, being stand off towards staff. Nurse came to room to address pt concerns d/t sitter stated he wanted to kill himself. Pt looks angry, voicing he wants to kill himself. Pt also stated he was going to tear the room up. Nurse attempted to redirect pt and he keeps stating " I will kill myself, you don't believe me do you". Nurse stated she was there to listen to his concerns. Nurse asked pt if he wanted to see the Dr and Pt stated ' I dont care". EDP aware of pt behavior. Pt continues to be in the corner, angry faced and body with a stance of tense and aggressive.

## 2023-09-03 NOTE — ED Notes (Signed)
 Pt is calm and compliant with taking his meds and requests.

## 2023-09-03 NOTE — ED Notes (Signed)
 2315 - Round check is complete at this time. Pt is resting with no s/s of distress.   0000 - Round check is complete at this time. Pt is resting with no s/s of distress.   0100 - Round check is complete at this time. Pt is resting with no s/s of distress.   0202 - Round check is complete at this time. Pt is resting with no s/s of distress.

## 2023-09-03 NOTE — ED Notes (Signed)
 EDP aware pt walked out. Nurse went to front of hospital to make sure pt was not in site.

## 2023-09-03 NOTE — ED Notes (Signed)
 Nurse was fixing medication and pt walked out the ED. Security was called.

## 2023-09-04 DIAGNOSIS — F259 Schizoaffective disorder, unspecified: Secondary | ICD-10-CM | POA: Diagnosis not present

## 2023-09-04 LAB — CBG MONITORING, ED: Glucose-Capillary: 195 mg/dL — ABNORMAL HIGH (ref 70–99)

## 2023-09-04 NOTE — ED Notes (Signed)
 Pt with safe transport to take pt to [facility]

## 2023-09-04 NOTE — ED Notes (Signed)
Safe Transport called for transport to [place]. 
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
## 2023-09-28 ENCOUNTER — Other Ambulatory Visit: Payer: Self-pay | Admitting: Family Medicine
